# Patient Record
Sex: Female | Born: 1945
Health system: Southern US, Community
[De-identification: ages and names within clinical notes are randomized; demographics above are authoritative.]

## PROBLEM LIST (undated history)

## (undated) DIAGNOSIS — L97509 Non-pressure chronic ulcer of other part of unspecified foot with unspecified severity: Secondary | ICD-10-CM

## (undated) DIAGNOSIS — H338 Other retinal detachments: Secondary | ICD-10-CM

## (undated) DIAGNOSIS — E11621 Type 2 diabetes mellitus with foot ulcer: Secondary | ICD-10-CM

## (undated) DIAGNOSIS — E785 Hyperlipidemia, unspecified: Secondary | ICD-10-CM

## (undated) DIAGNOSIS — R634 Abnormal weight loss: Secondary | ICD-10-CM

## (undated) DIAGNOSIS — R600 Localized edema: Secondary | ICD-10-CM

## (undated) DIAGNOSIS — H269 Unspecified cataract: Secondary | ICD-10-CM

## (undated) DIAGNOSIS — F21 Schizotypal disorder: Secondary | ICD-10-CM

## (undated) DIAGNOSIS — E079 Disorder of thyroid, unspecified: Secondary | ICD-10-CM

## (undated) DIAGNOSIS — I10 Essential (primary) hypertension: Secondary | ICD-10-CM

## (undated) DIAGNOSIS — E13319 Other specified diabetes mellitus with unspecified diabetic retinopathy without macular edema: Secondary | ICD-10-CM

## (undated) HISTORY — DX: Localized edema: R60.0

## (undated) HISTORY — DX: Unspecified cataract: H26.9

## (undated) HISTORY — PX: CATARACT EXTRACTION: SUR2

## (undated) HISTORY — PX: BREAST SURGERY: SHX581

## (undated) HISTORY — DX: Abnormal weight loss: R63.4

## (undated) HISTORY — DX: Other retinal detachments: H33.8

## (undated) HISTORY — DX: Schizotypal disorder: F21

## (undated) HISTORY — DX: Essential (primary) hypertension: I10

## (undated) HISTORY — PX: ROTATOR CUFF REPAIR: SHX139

## (undated) HISTORY — DX: Other specified diabetes mellitus with unspecified diabetic retinopathy without macular edema: E13.319

## (undated) HISTORY — PX: OTHER SURGICAL HISTORY: SHX169

## (undated) HISTORY — DX: Type 2 diabetes mellitus with foot ulcer: E11.621

## (undated) HISTORY — DX: Disorder of thyroid, unspecified: E07.9

## (undated) HISTORY — DX: Hyperlipidemia, unspecified: E78.5

## (undated) HISTORY — PX: TRANSTHORACIC ECHOCARDIOGRAM: SHX275

## (undated) HISTORY — DX: Non-pressure chronic ulcer of other part of unspecified foot with unspecified severity: L97.509

## (undated) HISTORY — PX: BREAST EXCISIONAL BIOPSY: SUR124

## (undated) HISTORY — PX: EYE SURGERY: SHX253

---

## 1989-04-19 HISTORY — PX: ROTATOR CUFF REPAIR: SHX139

## 1997-12-03 ENCOUNTER — Emergency Department (HOSPITAL_COMMUNITY): Admission: EM | Admit: 1997-12-03 | Discharge: 1997-12-03 | Payer: Self-pay | Admitting: Emergency Medicine

## 1998-01-02 ENCOUNTER — Encounter: Admission: RE | Admit: 1998-01-02 | Discharge: 1998-01-02 | Payer: Self-pay | Admitting: Family Medicine

## 1998-01-19 ENCOUNTER — Encounter: Admission: RE | Admit: 1998-01-19 | Discharge: 1998-01-19 | Payer: Self-pay | Admitting: Family Medicine

## 1998-06-13 ENCOUNTER — Encounter: Admission: RE | Admit: 1998-06-13 | Discharge: 1998-06-13 | Payer: Self-pay | Admitting: Sports Medicine

## 1998-06-14 ENCOUNTER — Encounter: Admission: RE | Admit: 1998-06-14 | Discharge: 1998-09-12 | Payer: Self-pay | Admitting: *Deleted

## 1998-06-21 ENCOUNTER — Encounter: Admission: RE | Admit: 1998-06-21 | Discharge: 1998-06-21 | Payer: Self-pay | Admitting: Family Medicine

## 1998-06-29 ENCOUNTER — Encounter: Admission: RE | Admit: 1998-06-29 | Discharge: 1998-06-29 | Payer: Self-pay | Admitting: Family Medicine

## 1998-07-04 ENCOUNTER — Encounter: Admission: RE | Admit: 1998-07-04 | Discharge: 1998-07-04 | Payer: Self-pay | Admitting: Family Medicine

## 1998-08-03 ENCOUNTER — Encounter: Admission: RE | Admit: 1998-08-03 | Discharge: 1998-08-03 | Payer: Self-pay | Admitting: Family Medicine

## 1998-08-22 ENCOUNTER — Encounter: Admission: RE | Admit: 1998-08-22 | Discharge: 1998-08-22 | Payer: Self-pay | Admitting: Sports Medicine

## 1998-10-03 ENCOUNTER — Encounter: Admission: RE | Admit: 1998-10-03 | Discharge: 1998-10-03 | Payer: Self-pay | Admitting: Sports Medicine

## 1999-03-17 ENCOUNTER — Emergency Department (HOSPITAL_COMMUNITY): Admission: EM | Admit: 1999-03-17 | Discharge: 1999-03-17 | Payer: Self-pay | Admitting: Emergency Medicine

## 1999-03-17 ENCOUNTER — Encounter: Payer: Self-pay | Admitting: Emergency Medicine

## 1999-03-20 ENCOUNTER — Encounter: Admission: RE | Admit: 1999-03-20 | Discharge: 1999-03-20 | Payer: Self-pay | Admitting: Family Medicine

## 1999-03-21 ENCOUNTER — Encounter: Admission: RE | Admit: 1999-03-21 | Discharge: 1999-03-21 | Payer: Self-pay | Admitting: Family Medicine

## 1999-04-03 ENCOUNTER — Encounter: Admission: RE | Admit: 1999-04-03 | Discharge: 1999-04-03 | Payer: Self-pay | Admitting: Sports Medicine

## 1999-04-16 ENCOUNTER — Encounter: Admission: RE | Admit: 1999-04-16 | Discharge: 1999-04-16 | Payer: Self-pay | Admitting: Family Medicine

## 1999-04-27 ENCOUNTER — Encounter: Admission: RE | Admit: 1999-04-27 | Discharge: 1999-07-26 | Payer: Self-pay | Admitting: *Deleted

## 1999-05-11 ENCOUNTER — Encounter: Admission: RE | Admit: 1999-05-11 | Discharge: 1999-05-11 | Payer: Self-pay | Admitting: Family Medicine

## 1999-05-14 ENCOUNTER — Encounter: Admission: RE | Admit: 1999-05-14 | Discharge: 1999-05-14 | Payer: Self-pay | Admitting: Family Medicine

## 1999-05-21 ENCOUNTER — Encounter: Admission: RE | Admit: 1999-05-21 | Discharge: 1999-05-21 | Payer: Self-pay | Admitting: Family Medicine

## 1999-06-19 ENCOUNTER — Encounter: Admission: RE | Admit: 1999-06-19 | Discharge: 1999-06-19 | Payer: Self-pay | Admitting: Family Medicine

## 1999-06-28 ENCOUNTER — Encounter: Admission: RE | Admit: 1999-06-28 | Discharge: 1999-06-28 | Payer: Self-pay | Admitting: Family Medicine

## 1999-07-03 ENCOUNTER — Encounter: Admission: RE | Admit: 1999-07-03 | Discharge: 1999-07-03 | Payer: Self-pay | Admitting: Sports Medicine

## 1999-07-03 ENCOUNTER — Encounter: Payer: Self-pay | Admitting: Sports Medicine

## 1999-07-16 ENCOUNTER — Encounter: Admission: RE | Admit: 1999-07-16 | Discharge: 1999-07-16 | Payer: Self-pay | Admitting: Family Medicine

## 1999-07-30 ENCOUNTER — Encounter: Admission: RE | Admit: 1999-07-30 | Discharge: 1999-07-30 | Payer: Self-pay | Admitting: Family Medicine

## 1999-09-19 ENCOUNTER — Encounter: Admission: RE | Admit: 1999-09-19 | Discharge: 1999-09-19 | Payer: Self-pay | Admitting: Family Medicine

## 1999-10-15 ENCOUNTER — Encounter: Admission: RE | Admit: 1999-10-15 | Discharge: 1999-10-15 | Payer: Self-pay | Admitting: Family Medicine

## 1999-10-25 ENCOUNTER — Encounter: Admission: RE | Admit: 1999-10-25 | Discharge: 1999-10-25 | Payer: Self-pay | Admitting: Family Medicine

## 1999-11-01 ENCOUNTER — Encounter: Admission: RE | Admit: 1999-11-01 | Discharge: 1999-11-01 | Payer: Self-pay | Admitting: Family Medicine

## 1999-11-08 ENCOUNTER — Encounter: Admission: RE | Admit: 1999-11-08 | Discharge: 1999-11-08 | Payer: Self-pay | Admitting: Family Medicine

## 1999-11-28 ENCOUNTER — Encounter: Admission: RE | Admit: 1999-11-28 | Discharge: 1999-11-28 | Payer: Self-pay | Admitting: Family Medicine

## 2000-03-04 ENCOUNTER — Encounter: Admission: RE | Admit: 2000-03-04 | Discharge: 2000-06-02 | Payer: Self-pay | Admitting: *Deleted

## 2000-03-23 ENCOUNTER — Emergency Department (HOSPITAL_COMMUNITY): Admission: EM | Admit: 2000-03-23 | Discharge: 2000-03-23 | Payer: Self-pay | Admitting: Emergency Medicine

## 2000-03-24 ENCOUNTER — Encounter: Payer: Self-pay | Admitting: Emergency Medicine

## 2000-03-26 ENCOUNTER — Encounter: Admission: RE | Admit: 2000-03-26 | Discharge: 2000-03-26 | Payer: Self-pay | Admitting: Family Medicine

## 2000-03-28 ENCOUNTER — Encounter: Admission: RE | Admit: 2000-03-28 | Discharge: 2000-03-28 | Payer: Self-pay | Admitting: Family Medicine

## 2000-04-24 ENCOUNTER — Encounter: Admission: RE | Admit: 2000-04-24 | Discharge: 2000-04-24 | Payer: Self-pay | Admitting: Family Medicine

## 2000-04-24 ENCOUNTER — Encounter: Admission: RE | Admit: 2000-04-24 | Discharge: 2000-04-24 | Payer: Self-pay | Admitting: *Deleted

## 2000-04-24 ENCOUNTER — Encounter: Payer: Self-pay | Admitting: *Deleted

## 2000-05-05 ENCOUNTER — Encounter: Admission: RE | Admit: 2000-05-05 | Discharge: 2000-05-05 | Payer: Self-pay | Admitting: *Deleted

## 2000-05-05 ENCOUNTER — Encounter: Payer: Self-pay | Admitting: *Deleted

## 2000-05-20 ENCOUNTER — Encounter: Admission: RE | Admit: 2000-05-20 | Discharge: 2000-05-20 | Payer: Self-pay | Admitting: Family Medicine

## 2000-06-04 ENCOUNTER — Encounter: Admission: RE | Admit: 2000-06-04 | Discharge: 2000-06-04 | Payer: Self-pay | Admitting: Family Medicine

## 2000-06-11 ENCOUNTER — Encounter: Admission: RE | Admit: 2000-06-11 | Discharge: 2000-06-11 | Payer: Self-pay | Admitting: Family Medicine

## 2000-06-16 ENCOUNTER — Encounter: Admission: RE | Admit: 2000-06-16 | Discharge: 2000-06-16 | Payer: Self-pay | Admitting: Family Medicine

## 2000-07-02 ENCOUNTER — Encounter: Admission: RE | Admit: 2000-07-02 | Discharge: 2000-07-02 | Payer: Self-pay | Admitting: Family Medicine

## 2000-07-03 ENCOUNTER — Encounter: Admission: RE | Admit: 2000-07-03 | Discharge: 2000-07-03 | Payer: Self-pay | Admitting: *Deleted

## 2000-07-16 ENCOUNTER — Encounter: Admission: RE | Admit: 2000-07-16 | Discharge: 2000-07-16 | Payer: Self-pay | Admitting: Family Medicine

## 2000-07-25 ENCOUNTER — Encounter: Admission: RE | Admit: 2000-07-25 | Discharge: 2000-07-25 | Payer: Self-pay | Admitting: Family Medicine

## 2000-07-30 ENCOUNTER — Encounter: Admission: RE | Admit: 2000-07-30 | Discharge: 2000-07-30 | Payer: Self-pay | Admitting: Family Medicine

## 2000-08-20 ENCOUNTER — Encounter: Admission: RE | Admit: 2000-08-20 | Discharge: 2000-08-20 | Payer: Self-pay | Admitting: Pediatrics

## 2000-09-03 ENCOUNTER — Encounter: Admission: RE | Admit: 2000-09-03 | Discharge: 2000-09-03 | Payer: Self-pay | Admitting: Family Medicine

## 2000-09-17 ENCOUNTER — Encounter: Admission: RE | Admit: 2000-09-17 | Discharge: 2000-09-17 | Payer: Self-pay | Admitting: Family Medicine

## 2000-10-02 ENCOUNTER — Encounter: Admission: RE | Admit: 2000-10-02 | Discharge: 2000-10-02 | Payer: Self-pay | Admitting: Family Medicine

## 2001-01-01 ENCOUNTER — Encounter: Admission: RE | Admit: 2001-01-01 | Discharge: 2001-01-01 | Payer: Self-pay | Admitting: Family Medicine

## 2001-02-06 ENCOUNTER — Encounter: Admission: RE | Admit: 2001-02-06 | Discharge: 2001-05-07 | Payer: Self-pay | Admitting: *Deleted

## 2001-02-09 ENCOUNTER — Encounter: Admission: RE | Admit: 2001-02-09 | Discharge: 2001-02-09 | Payer: Self-pay | Admitting: Family Medicine

## 2001-03-19 ENCOUNTER — Encounter: Admission: RE | Admit: 2001-03-19 | Discharge: 2001-03-19 | Payer: Self-pay | Admitting: Family Medicine

## 2001-04-09 ENCOUNTER — Encounter: Admission: RE | Admit: 2001-04-09 | Discharge: 2001-04-09 | Payer: Self-pay | Admitting: Family Medicine

## 2001-05-08 ENCOUNTER — Encounter: Admission: RE | Admit: 2001-05-08 | Discharge: 2001-05-08 | Payer: Self-pay | Admitting: Family Medicine

## 2001-10-01 ENCOUNTER — Encounter: Admission: RE | Admit: 2001-10-01 | Discharge: 2001-10-01 | Payer: Self-pay | Admitting: Family Medicine

## 2001-10-08 ENCOUNTER — Encounter: Admission: RE | Admit: 2001-10-08 | Discharge: 2001-10-08 | Payer: Self-pay | Admitting: Family Medicine

## 2001-10-19 ENCOUNTER — Encounter: Admission: RE | Admit: 2001-10-19 | Discharge: 2001-10-19 | Payer: Self-pay | Admitting: *Deleted

## 2001-10-19 ENCOUNTER — Encounter: Payer: Self-pay | Admitting: *Deleted

## 2001-10-20 ENCOUNTER — Encounter: Admission: RE | Admit: 2001-10-20 | Discharge: 2001-10-20 | Payer: Self-pay | Admitting: Family Medicine

## 2001-11-02 ENCOUNTER — Encounter: Admission: RE | Admit: 2001-11-02 | Discharge: 2001-11-02 | Payer: Self-pay | Admitting: Family Medicine

## 2001-12-02 ENCOUNTER — Encounter: Admission: RE | Admit: 2001-12-02 | Discharge: 2001-12-02 | Payer: Self-pay | Admitting: Family Medicine

## 2001-12-14 ENCOUNTER — Encounter: Admission: RE | Admit: 2001-12-14 | Discharge: 2001-12-14 | Payer: Self-pay | Admitting: Sports Medicine

## 2001-12-31 ENCOUNTER — Encounter: Admission: RE | Admit: 2001-12-31 | Discharge: 2001-12-31 | Payer: Self-pay | Admitting: Family Medicine

## 2002-01-05 ENCOUNTER — Encounter: Admission: RE | Admit: 2002-01-05 | Discharge: 2002-01-05 | Payer: Self-pay | Admitting: Family Medicine

## 2002-01-08 ENCOUNTER — Encounter: Admission: RE | Admit: 2002-01-08 | Discharge: 2002-01-08 | Payer: Self-pay | Admitting: Family Medicine

## 2002-02-24 ENCOUNTER — Encounter: Admission: RE | Admit: 2002-02-24 | Discharge: 2002-02-24 | Payer: Self-pay | Admitting: Sports Medicine

## 2002-04-15 ENCOUNTER — Encounter: Admission: RE | Admit: 2002-04-15 | Discharge: 2002-04-15 | Payer: Self-pay | Admitting: Family Medicine

## 2002-05-13 ENCOUNTER — Encounter: Admission: RE | Admit: 2002-05-13 | Discharge: 2002-05-13 | Payer: Self-pay | Admitting: Family Medicine

## 2002-05-28 ENCOUNTER — Encounter: Admission: RE | Admit: 2002-05-28 | Discharge: 2002-05-28 | Payer: Self-pay | Admitting: Family Medicine

## 2002-06-03 ENCOUNTER — Encounter: Payer: Self-pay | Admitting: Emergency Medicine

## 2002-06-03 ENCOUNTER — Emergency Department (HOSPITAL_COMMUNITY): Admission: EM | Admit: 2002-06-03 | Discharge: 2002-06-03 | Payer: Self-pay | Admitting: Emergency Medicine

## 2002-07-14 ENCOUNTER — Encounter: Admission: RE | Admit: 2002-07-14 | Discharge: 2002-07-14 | Payer: Self-pay | Admitting: Family Medicine

## 2002-09-09 ENCOUNTER — Encounter: Admission: RE | Admit: 2002-09-09 | Discharge: 2002-09-09 | Payer: Self-pay | Admitting: Family Medicine

## 2002-09-23 ENCOUNTER — Encounter: Admission: RE | Admit: 2002-09-23 | Discharge: 2002-09-23 | Payer: Self-pay | Admitting: Family Medicine

## 2002-09-27 ENCOUNTER — Encounter: Admission: RE | Admit: 2002-09-27 | Discharge: 2002-09-27 | Payer: Self-pay | Admitting: Family Medicine

## 2002-10-12 ENCOUNTER — Encounter: Admission: RE | Admit: 2002-10-12 | Discharge: 2002-10-12 | Payer: Self-pay | Admitting: Family Medicine

## 2002-10-27 ENCOUNTER — Encounter: Admission: RE | Admit: 2002-10-27 | Discharge: 2002-10-27 | Payer: Self-pay | Admitting: Family Medicine

## 2002-11-29 ENCOUNTER — Encounter: Admission: RE | Admit: 2002-11-29 | Discharge: 2002-11-29 | Payer: Self-pay | Admitting: Family Medicine

## 2003-02-24 ENCOUNTER — Encounter: Admission: RE | Admit: 2003-02-24 | Discharge: 2003-02-24 | Payer: Self-pay | Admitting: Family Medicine

## 2003-03-31 ENCOUNTER — Encounter: Admission: RE | Admit: 2003-03-31 | Discharge: 2003-03-31 | Payer: Self-pay | Admitting: Family Medicine

## 2003-05-30 ENCOUNTER — Encounter: Admission: RE | Admit: 2003-05-30 | Discharge: 2003-05-30 | Payer: Self-pay | Admitting: Family Medicine

## 2003-06-17 ENCOUNTER — Encounter: Admission: RE | Admit: 2003-06-17 | Discharge: 2003-06-17 | Payer: Self-pay | Admitting: Family Medicine

## 2003-07-26 ENCOUNTER — Encounter: Admission: RE | Admit: 2003-07-26 | Discharge: 2003-07-26 | Payer: Self-pay | Admitting: Sports Medicine

## 2003-08-02 ENCOUNTER — Encounter: Admission: RE | Admit: 2003-08-02 | Discharge: 2003-08-02 | Payer: Self-pay | Admitting: Sports Medicine

## 2003-08-03 ENCOUNTER — Ambulatory Visit (HOSPITAL_COMMUNITY): Admission: RE | Admit: 2003-08-03 | Discharge: 2003-08-03 | Payer: Self-pay | Admitting: Sports Medicine

## 2003-08-11 ENCOUNTER — Encounter: Admission: RE | Admit: 2003-08-11 | Discharge: 2003-08-11 | Payer: Self-pay | Admitting: Family Medicine

## 2003-08-25 ENCOUNTER — Encounter: Admission: RE | Admit: 2003-08-25 | Discharge: 2003-08-25 | Payer: Self-pay | Admitting: Family Medicine

## 2003-09-14 ENCOUNTER — Encounter: Admission: RE | Admit: 2003-09-14 | Discharge: 2003-09-14 | Payer: Self-pay | Admitting: Family Medicine

## 2003-10-04 ENCOUNTER — Encounter: Admission: RE | Admit: 2003-10-04 | Discharge: 2003-10-04 | Payer: Self-pay | Admitting: Sports Medicine

## 2003-10-18 ENCOUNTER — Encounter: Admission: RE | Admit: 2003-10-18 | Discharge: 2003-10-18 | Payer: Self-pay | Admitting: Family Medicine

## 2003-10-25 ENCOUNTER — Encounter: Admission: RE | Admit: 2003-10-25 | Discharge: 2004-01-23 | Payer: Self-pay | Admitting: *Deleted

## 2003-10-27 ENCOUNTER — Encounter: Admission: RE | Admit: 2003-10-27 | Discharge: 2003-10-27 | Payer: Self-pay | Admitting: Sports Medicine

## 2003-11-29 ENCOUNTER — Encounter: Admission: RE | Admit: 2003-11-29 | Discharge: 2003-11-29 | Payer: Self-pay | Admitting: Sports Medicine

## 2003-12-21 ENCOUNTER — Encounter: Admission: RE | Admit: 2003-12-21 | Discharge: 2003-12-21 | Payer: Self-pay | Admitting: Family Medicine

## 2004-03-21 ENCOUNTER — Encounter: Admission: RE | Admit: 2004-03-21 | Discharge: 2004-03-21 | Payer: Self-pay | Admitting: Sports Medicine

## 2004-04-12 ENCOUNTER — Encounter: Admission: RE | Admit: 2004-04-12 | Discharge: 2004-04-12 | Payer: Self-pay | Admitting: Family Medicine

## 2004-04-23 ENCOUNTER — Emergency Department (HOSPITAL_COMMUNITY): Admission: EM | Admit: 2004-04-23 | Discharge: 2004-04-23 | Payer: Self-pay | Admitting: Family Medicine

## 2004-05-07 ENCOUNTER — Ambulatory Visit: Payer: Self-pay | Admitting: Family Medicine

## 2004-05-22 ENCOUNTER — Encounter: Admission: RE | Admit: 2004-05-22 | Discharge: 2004-06-14 | Payer: Self-pay

## 2004-06-08 ENCOUNTER — Ambulatory Visit: Payer: Self-pay | Admitting: Sports Medicine

## 2004-06-22 ENCOUNTER — Ambulatory Visit: Payer: Self-pay | Admitting: Sports Medicine

## 2004-07-19 ENCOUNTER — Ambulatory Visit: Payer: Self-pay | Admitting: Family Medicine

## 2004-09-10 ENCOUNTER — Ambulatory Visit: Payer: Self-pay | Admitting: Family Medicine

## 2004-10-15 ENCOUNTER — Ambulatory Visit: Payer: Self-pay | Admitting: Family Medicine

## 2004-10-18 ENCOUNTER — Ambulatory Visit (HOSPITAL_COMMUNITY): Admission: RE | Admit: 2004-10-18 | Discharge: 2004-10-18 | Payer: Self-pay | Admitting: Family Medicine

## 2004-10-18 ENCOUNTER — Ambulatory Visit: Payer: Self-pay | Admitting: Family Medicine

## 2004-11-07 ENCOUNTER — Ambulatory Visit: Payer: Self-pay | Admitting: Family Medicine

## 2004-11-08 ENCOUNTER — Ambulatory Visit: Payer: Self-pay | Admitting: Sports Medicine

## 2004-11-14 ENCOUNTER — Encounter: Payer: Self-pay | Admitting: Cardiology

## 2004-11-14 ENCOUNTER — Ambulatory Visit: Payer: Self-pay | Admitting: Cardiology

## 2004-11-14 ENCOUNTER — Ambulatory Visit (HOSPITAL_COMMUNITY): Admission: RE | Admit: 2004-11-14 | Discharge: 2004-11-14 | Payer: Self-pay | Admitting: Sports Medicine

## 2004-11-15 ENCOUNTER — Ambulatory Visit: Payer: Self-pay | Admitting: Family Medicine

## 2004-11-29 ENCOUNTER — Ambulatory Visit: Payer: Self-pay | Admitting: Sports Medicine

## 2004-12-11 ENCOUNTER — Ambulatory Visit: Payer: Self-pay | Admitting: Family Medicine

## 2004-12-14 ENCOUNTER — Ambulatory Visit: Payer: Self-pay | Admitting: Family Medicine

## 2004-12-21 ENCOUNTER — Ambulatory Visit: Payer: Self-pay | Admitting: Family Medicine

## 2004-12-31 ENCOUNTER — Ambulatory Visit: Payer: Self-pay | Admitting: Family Medicine

## 2005-01-08 ENCOUNTER — Ambulatory Visit: Payer: Self-pay | Admitting: Family Medicine

## 2005-01-15 ENCOUNTER — Ambulatory Visit: Payer: Self-pay | Admitting: Family Medicine

## 2005-01-28 ENCOUNTER — Ambulatory Visit: Payer: Self-pay | Admitting: Family Medicine

## 2005-01-30 ENCOUNTER — Emergency Department (HOSPITAL_COMMUNITY): Admission: EM | Admit: 2005-01-30 | Discharge: 2005-01-30 | Payer: Self-pay | Admitting: Family Medicine

## 2005-02-05 ENCOUNTER — Ambulatory Visit: Payer: Self-pay | Admitting: Family Medicine

## 2005-02-08 ENCOUNTER — Ambulatory Visit: Payer: Self-pay | Admitting: Sports Medicine

## 2005-02-26 ENCOUNTER — Ambulatory Visit: Payer: Self-pay | Admitting: Family Medicine

## 2005-03-07 ENCOUNTER — Ambulatory Visit: Payer: Self-pay | Admitting: Family Medicine

## 2005-03-22 ENCOUNTER — Ambulatory Visit: Payer: Self-pay | Admitting: Family Medicine

## 2005-04-08 ENCOUNTER — Ambulatory Visit: Payer: Self-pay | Admitting: Family Medicine

## 2005-04-23 ENCOUNTER — Emergency Department (HOSPITAL_COMMUNITY): Admission: EM | Admit: 2005-04-23 | Discharge: 2005-04-23 | Payer: Self-pay | Admitting: Family Medicine

## 2005-04-23 ENCOUNTER — Ambulatory Visit: Payer: Self-pay | Admitting: Sports Medicine

## 2005-05-29 ENCOUNTER — Ambulatory Visit: Payer: Self-pay | Admitting: Family Medicine

## 2005-06-11 ENCOUNTER — Ambulatory Visit: Payer: Self-pay | Admitting: Sports Medicine

## 2005-06-14 ENCOUNTER — Ambulatory Visit (HOSPITAL_COMMUNITY): Admission: RE | Admit: 2005-06-14 | Discharge: 2005-06-14 | Payer: Self-pay | Admitting: Sports Medicine

## 2005-07-02 ENCOUNTER — Ambulatory Visit: Payer: Self-pay | Admitting: Sports Medicine

## 2005-07-24 ENCOUNTER — Encounter (INDEPENDENT_AMBULATORY_CARE_PROVIDER_SITE_OTHER): Payer: Self-pay | Admitting: *Deleted

## 2005-07-24 LAB — CONVERTED CEMR LAB

## 2005-07-25 ENCOUNTER — Ambulatory Visit: Payer: Self-pay | Admitting: Family Medicine

## 2005-08-30 ENCOUNTER — Ambulatory Visit: Payer: Self-pay | Admitting: Sports Medicine

## 2005-09-12 ENCOUNTER — Ambulatory Visit: Payer: Self-pay | Admitting: Family Medicine

## 2005-09-23 ENCOUNTER — Ambulatory Visit (HOSPITAL_COMMUNITY): Admission: RE | Admit: 2005-09-23 | Discharge: 2005-09-23 | Payer: Self-pay

## 2005-09-23 ENCOUNTER — Ambulatory Visit: Payer: Self-pay | Admitting: Family Medicine

## 2005-09-27 ENCOUNTER — Ambulatory Visit: Payer: Self-pay | Admitting: Family Medicine

## 2005-12-13 ENCOUNTER — Ambulatory Visit: Payer: Self-pay | Admitting: Family Medicine

## 2005-12-27 ENCOUNTER — Ambulatory Visit: Payer: Self-pay | Admitting: Family Medicine

## 2006-01-03 ENCOUNTER — Ambulatory Visit: Payer: Self-pay | Admitting: Family Medicine

## 2006-02-04 ENCOUNTER — Ambulatory Visit (HOSPITAL_COMMUNITY): Admission: RE | Admit: 2006-02-04 | Discharge: 2006-02-04 | Payer: Self-pay | Admitting: Sports Medicine

## 2006-02-04 ENCOUNTER — Encounter (INDEPENDENT_AMBULATORY_CARE_PROVIDER_SITE_OTHER): Payer: Self-pay | Admitting: Cardiology

## 2006-03-04 ENCOUNTER — Ambulatory Visit: Payer: Self-pay | Admitting: Family Medicine

## 2006-03-28 ENCOUNTER — Ambulatory Visit: Payer: Self-pay | Admitting: Family Medicine

## 2006-05-07 ENCOUNTER — Ambulatory Visit: Payer: Self-pay | Admitting: Family Medicine

## 2006-06-04 ENCOUNTER — Ambulatory Visit: Payer: Self-pay | Admitting: Family Medicine

## 2006-07-03 ENCOUNTER — Ambulatory Visit: Payer: Self-pay | Admitting: Family Medicine

## 2006-08-28 ENCOUNTER — Ambulatory Visit: Payer: Self-pay | Admitting: Family Medicine

## 2006-09-25 ENCOUNTER — Ambulatory Visit: Payer: Self-pay | Admitting: Family Medicine

## 2006-10-16 DIAGNOSIS — N3941 Urge incontinence: Secondary | ICD-10-CM | POA: Insufficient documentation

## 2006-10-16 DIAGNOSIS — M899 Disorder of bone, unspecified: Secondary | ICD-10-CM | POA: Insufficient documentation

## 2006-10-16 DIAGNOSIS — E119 Type 2 diabetes mellitus without complications: Secondary | ICD-10-CM | POA: Insufficient documentation

## 2006-10-16 DIAGNOSIS — E039 Hypothyroidism, unspecified: Secondary | ICD-10-CM | POA: Insufficient documentation

## 2006-10-16 DIAGNOSIS — M949 Disorder of cartilage, unspecified: Secondary | ICD-10-CM

## 2006-10-16 DIAGNOSIS — M069 Rheumatoid arthritis, unspecified: Secondary | ICD-10-CM | POA: Insufficient documentation

## 2006-10-16 DIAGNOSIS — E669 Obesity, unspecified: Secondary | ICD-10-CM | POA: Insufficient documentation

## 2006-10-16 DIAGNOSIS — K219 Gastro-esophageal reflux disease without esophagitis: Secondary | ICD-10-CM | POA: Insufficient documentation

## 2006-10-16 DIAGNOSIS — I10 Essential (primary) hypertension: Secondary | ICD-10-CM | POA: Insufficient documentation

## 2006-10-16 DIAGNOSIS — E1165 Type 2 diabetes mellitus with hyperglycemia: Secondary | ICD-10-CM

## 2006-10-17 ENCOUNTER — Encounter (INDEPENDENT_AMBULATORY_CARE_PROVIDER_SITE_OTHER): Payer: Self-pay | Admitting: *Deleted

## 2006-12-01 ENCOUNTER — Ambulatory Visit: Payer: Self-pay | Admitting: Family Medicine

## 2006-12-01 ENCOUNTER — Encounter (INDEPENDENT_AMBULATORY_CARE_PROVIDER_SITE_OTHER): Payer: Self-pay | Admitting: Family Medicine

## 2006-12-01 LAB — CONVERTED CEMR LAB
Alkaline Phosphatase: 93 units/L (ref 39–117)
BUN: 11 mg/dL (ref 6–23)
Basophils Absolute: 0 10*3/uL (ref 0.0–0.1)
Basophils Relative: 0 % (ref 0–1)
Cholesterol, target level: 200 mg/dL
Creatinine, Ser: 0.73 mg/dL (ref 0.40–1.20)
Eosinophils Relative: 2 % (ref 0–5)
Folate: >20 ng/mL
Glucose, Bld: 97 mg/dL (ref 70–99)
Glucose, Urine, Semiquant: 100
Hemoglobin: 11.5 g/dL — ABNORMAL LOW (ref 12.0–15.0)
LDL Goal: 100 mg/dL
MCHC: 30.8 g/dL (ref 30.0–36.0)
Monocytes Absolute: 0.5 10*3/uL (ref 0.2–0.7)
Neutro Abs: 2.5 10*3/uL (ref 1.7–7.7)
RDW: 13.8 % (ref 11.5–14.0)
Sodium: 144 meq/L (ref 135–145)
Total Bilirubin: 0.4 mg/dL (ref 0.3–1.2)
Vitamin B-12: 636 pg/mL (ref 211–911)

## 2006-12-04 ENCOUNTER — Encounter (INDEPENDENT_AMBULATORY_CARE_PROVIDER_SITE_OTHER): Payer: Self-pay | Admitting: Family Medicine

## 2007-01-02 ENCOUNTER — Ambulatory Visit: Payer: Self-pay | Admitting: Family Medicine

## 2007-01-02 ENCOUNTER — Telehealth: Payer: Self-pay | Admitting: *Deleted

## 2007-02-16 ENCOUNTER — Telehealth (INDEPENDENT_AMBULATORY_CARE_PROVIDER_SITE_OTHER): Payer: Self-pay | Admitting: *Deleted

## 2007-02-18 ENCOUNTER — Encounter (INDEPENDENT_AMBULATORY_CARE_PROVIDER_SITE_OTHER): Payer: Self-pay | Admitting: Family Medicine

## 2007-02-18 ENCOUNTER — Ambulatory Visit: Payer: Self-pay | Admitting: Sports Medicine

## 2007-03-04 ENCOUNTER — Telehealth: Payer: Self-pay | Admitting: *Deleted

## 2007-03-17 ENCOUNTER — Ambulatory Visit: Payer: Self-pay | Admitting: Family Medicine

## 2007-03-17 DIAGNOSIS — E1140 Type 2 diabetes mellitus with diabetic neuropathy, unspecified: Secondary | ICD-10-CM | POA: Insufficient documentation

## 2007-03-17 DIAGNOSIS — E1149 Type 2 diabetes mellitus with other diabetic neurological complication: Secondary | ICD-10-CM | POA: Insufficient documentation

## 2007-03-17 DIAGNOSIS — E1139 Type 2 diabetes mellitus with other diabetic ophthalmic complication: Secondary | ICD-10-CM | POA: Insufficient documentation

## 2007-03-17 LAB — CONVERTED CEMR LAB: Hgb A1c MFr Bld: 8 %

## 2007-03-18 ENCOUNTER — Telehealth (INDEPENDENT_AMBULATORY_CARE_PROVIDER_SITE_OTHER): Payer: Self-pay | Admitting: Family Medicine

## 2007-04-01 ENCOUNTER — Encounter: Admission: RE | Admit: 2007-04-01 | Discharge: 2007-04-01 | Payer: Self-pay | Admitting: Family Medicine

## 2007-04-02 ENCOUNTER — Ambulatory Visit: Payer: Self-pay | Admitting: Family Medicine

## 2007-04-06 ENCOUNTER — Ambulatory Visit (HOSPITAL_COMMUNITY): Admission: RE | Admit: 2007-04-06 | Discharge: 2007-04-06 | Payer: Self-pay | Admitting: Family Medicine

## 2007-04-21 ENCOUNTER — Encounter (INDEPENDENT_AMBULATORY_CARE_PROVIDER_SITE_OTHER): Payer: Self-pay | Admitting: Family Medicine

## 2007-04-29 ENCOUNTER — Telehealth: Payer: Self-pay | Admitting: *Deleted

## 2007-04-30 ENCOUNTER — Ambulatory Visit: Payer: Self-pay | Admitting: Family Medicine

## 2007-05-13 ENCOUNTER — Telehealth (INDEPENDENT_AMBULATORY_CARE_PROVIDER_SITE_OTHER): Payer: Self-pay | Admitting: Family Medicine

## 2007-05-21 ENCOUNTER — Encounter (INDEPENDENT_AMBULATORY_CARE_PROVIDER_SITE_OTHER): Payer: Self-pay | Admitting: Family Medicine

## 2007-06-09 ENCOUNTER — Telehealth (INDEPENDENT_AMBULATORY_CARE_PROVIDER_SITE_OTHER): Payer: Self-pay | Admitting: Family Medicine

## 2007-06-10 ENCOUNTER — Ambulatory Visit: Payer: Self-pay | Admitting: Family Medicine

## 2007-06-22 ENCOUNTER — Telehealth: Payer: Self-pay | Admitting: *Deleted

## 2007-06-23 ENCOUNTER — Encounter (INDEPENDENT_AMBULATORY_CARE_PROVIDER_SITE_OTHER): Payer: Self-pay | Admitting: Family Medicine

## 2007-06-27 ENCOUNTER — Encounter (INDEPENDENT_AMBULATORY_CARE_PROVIDER_SITE_OTHER): Payer: Self-pay | Admitting: Family Medicine

## 2007-07-10 ENCOUNTER — Ambulatory Visit: Payer: Self-pay | Admitting: Family Medicine

## 2007-07-27 ENCOUNTER — Encounter (INDEPENDENT_AMBULATORY_CARE_PROVIDER_SITE_OTHER): Payer: Self-pay | Admitting: Family Medicine

## 2007-07-27 ENCOUNTER — Ambulatory Visit: Payer: Self-pay | Admitting: Sports Medicine

## 2007-07-27 LAB — CONVERTED CEMR LAB: Hgb A1c MFr Bld: 10.8 %

## 2007-07-28 LAB — CONVERTED CEMR LAB: TSH: 6.306 microintl units/mL — ABNORMAL HIGH (ref 0.350–5.50)

## 2007-08-13 ENCOUNTER — Telehealth (INDEPENDENT_AMBULATORY_CARE_PROVIDER_SITE_OTHER): Payer: Self-pay | Admitting: Family Medicine

## 2007-09-21 ENCOUNTER — Encounter (INDEPENDENT_AMBULATORY_CARE_PROVIDER_SITE_OTHER): Payer: Self-pay | Admitting: Family Medicine

## 2007-10-06 ENCOUNTER — Ambulatory Visit: Payer: Self-pay | Admitting: Family Medicine

## 2007-10-08 ENCOUNTER — Ambulatory Visit: Payer: Self-pay | Admitting: Family Medicine

## 2007-10-08 ENCOUNTER — Encounter (INDEPENDENT_AMBULATORY_CARE_PROVIDER_SITE_OTHER): Payer: Self-pay | Admitting: Family Medicine

## 2007-11-17 ENCOUNTER — Ambulatory Visit: Payer: Self-pay | Admitting: Family Medicine

## 2008-01-12 ENCOUNTER — Telehealth (INDEPENDENT_AMBULATORY_CARE_PROVIDER_SITE_OTHER): Payer: Self-pay | Admitting: Family Medicine

## 2008-02-02 ENCOUNTER — Ambulatory Visit: Payer: Self-pay | Admitting: Family Medicine

## 2008-02-17 ENCOUNTER — Encounter (INDEPENDENT_AMBULATORY_CARE_PROVIDER_SITE_OTHER): Payer: Self-pay | Admitting: Family Medicine

## 2008-02-17 ENCOUNTER — Ambulatory Visit: Payer: Self-pay | Admitting: Family Medicine

## 2008-02-17 LAB — CONVERTED CEMR LAB: LDL Cholesterol: 89 mg/dL

## 2008-03-22 ENCOUNTER — Ambulatory Visit: Payer: Self-pay | Admitting: Family Medicine

## 2008-03-22 ENCOUNTER — Encounter (INDEPENDENT_AMBULATORY_CARE_PROVIDER_SITE_OTHER): Payer: Self-pay | Admitting: Family Medicine

## 2008-03-22 ENCOUNTER — Telehealth: Payer: Self-pay | Admitting: *Deleted

## 2008-05-04 ENCOUNTER — Ambulatory Visit: Payer: Self-pay | Admitting: Family Medicine

## 2008-05-04 ENCOUNTER — Encounter (INDEPENDENT_AMBULATORY_CARE_PROVIDER_SITE_OTHER): Payer: Self-pay | Admitting: *Deleted

## 2008-05-06 ENCOUNTER — Encounter (INDEPENDENT_AMBULATORY_CARE_PROVIDER_SITE_OTHER): Payer: Self-pay | Admitting: Family Medicine

## 2008-05-11 ENCOUNTER — Ambulatory Visit: Payer: Self-pay | Admitting: Family Medicine

## 2008-05-11 ENCOUNTER — Encounter (INDEPENDENT_AMBULATORY_CARE_PROVIDER_SITE_OTHER): Payer: Self-pay | Admitting: Family Medicine

## 2008-05-11 LAB — CONVERTED CEMR LAB
ALT: 33 units/L (ref 0–35)
Albumin: 3.5 g/dL (ref 3.5–5.2)
BUN: 9 mg/dL (ref 6–23)
CO2: 25 meq/L (ref 19–32)
Calcium: 9.1 mg/dL (ref 8.4–10.5)
Chloride: 104 meq/L (ref 96–112)
Creatinine, Ser: 0.84 mg/dL (ref 0.40–1.20)
HCT: 35.9 % — ABNORMAL LOW (ref 36.0–46.0)
Hemoglobin: 11.3 g/dL — ABNORMAL LOW (ref 12.0–15.0)
Potassium: 4.2 meq/L (ref 3.5–5.3)
TSH: 8.317 microintl units/mL — ABNORMAL HIGH (ref 0.350–4.50)
WBC: 4.7 10*3/uL (ref 4.0–10.5)

## 2008-05-12 ENCOUNTER — Ambulatory Visit: Payer: Self-pay | Admitting: Family Medicine

## 2008-05-12 ENCOUNTER — Encounter (INDEPENDENT_AMBULATORY_CARE_PROVIDER_SITE_OTHER): Payer: Self-pay | Admitting: Family Medicine

## 2008-05-13 ENCOUNTER — Encounter: Payer: Self-pay | Admitting: *Deleted

## 2008-05-23 ENCOUNTER — Ambulatory Visit: Payer: Self-pay | Admitting: Family Medicine

## 2008-06-09 ENCOUNTER — Ambulatory Visit: Payer: Self-pay | Admitting: Family Medicine

## 2008-06-14 ENCOUNTER — Ambulatory Visit: Payer: Self-pay | Admitting: Family Medicine

## 2008-06-23 ENCOUNTER — Ambulatory Visit: Payer: Self-pay | Admitting: Family Medicine

## 2008-07-07 ENCOUNTER — Ambulatory Visit: Payer: Self-pay | Admitting: Family Medicine

## 2008-08-16 ENCOUNTER — Ambulatory Visit: Payer: Self-pay | Admitting: Family Medicine

## 2008-08-16 ENCOUNTER — Encounter (INDEPENDENT_AMBULATORY_CARE_PROVIDER_SITE_OTHER): Payer: Self-pay | Admitting: Family Medicine

## 2008-08-16 LAB — CONVERTED CEMR LAB: Hgb A1c MFr Bld: 10.7 %

## 2008-09-27 ENCOUNTER — Ambulatory Visit (HOSPITAL_COMMUNITY): Admission: RE | Admit: 2008-09-27 | Discharge: 2008-09-27 | Payer: Self-pay | Admitting: Family Medicine

## 2008-10-03 ENCOUNTER — Telehealth (INDEPENDENT_AMBULATORY_CARE_PROVIDER_SITE_OTHER): Payer: Self-pay | Admitting: Family Medicine

## 2008-10-04 ENCOUNTER — Telehealth (INDEPENDENT_AMBULATORY_CARE_PROVIDER_SITE_OTHER): Payer: Self-pay | Admitting: *Deleted

## 2008-10-04 ENCOUNTER — Ambulatory Visit: Payer: Self-pay | Admitting: Family Medicine

## 2008-10-31 ENCOUNTER — Telehealth: Payer: Self-pay | Admitting: *Deleted

## 2008-11-04 ENCOUNTER — Ambulatory Visit: Payer: Self-pay | Admitting: Family Medicine

## 2008-11-10 ENCOUNTER — Ambulatory Visit: Payer: Self-pay | Admitting: Family Medicine

## 2008-11-24 ENCOUNTER — Ambulatory Visit: Payer: Self-pay | Admitting: Family Medicine

## 2008-12-19 ENCOUNTER — Ambulatory Visit: Payer: Self-pay | Admitting: Family Medicine

## 2009-01-30 ENCOUNTER — Ambulatory Visit: Payer: Self-pay | Admitting: Family Medicine

## 2009-02-01 ENCOUNTER — Emergency Department (HOSPITAL_COMMUNITY): Admission: EM | Admit: 2009-02-01 | Discharge: 2009-02-01 | Payer: Self-pay | Admitting: Emergency Medicine

## 2009-02-06 ENCOUNTER — Emergency Department (HOSPITAL_COMMUNITY): Admission: EM | Admit: 2009-02-06 | Discharge: 2009-02-06 | Payer: Self-pay | Admitting: Emergency Medicine

## 2009-02-13 ENCOUNTER — Telehealth: Payer: Self-pay | Admitting: Family Medicine

## 2009-02-14 ENCOUNTER — Encounter (INDEPENDENT_AMBULATORY_CARE_PROVIDER_SITE_OTHER): Payer: Self-pay | Admitting: Family Medicine

## 2009-02-14 ENCOUNTER — Ambulatory Visit: Payer: Self-pay | Admitting: Family Medicine

## 2009-03-23 ENCOUNTER — Ambulatory Visit: Payer: Self-pay | Admitting: Family Medicine

## 2009-04-20 ENCOUNTER — Encounter: Payer: Self-pay | Admitting: Family Medicine

## 2009-04-20 ENCOUNTER — Ambulatory Visit: Payer: Self-pay | Admitting: Family Medicine

## 2009-04-21 ENCOUNTER — Encounter: Payer: Self-pay | Admitting: Family Medicine

## 2009-04-21 LAB — CONVERTED CEMR LAB: TSH: 8.619 microintl units/mL — ABNORMAL HIGH (ref 0.350–4.500)

## 2009-05-01 ENCOUNTER — Ambulatory Visit: Payer: Self-pay

## 2009-08-28 ENCOUNTER — Telehealth: Payer: Self-pay | Admitting: *Deleted

## 2009-09-01 ENCOUNTER — Encounter: Payer: Self-pay | Admitting: Family Medicine

## 2009-09-01 ENCOUNTER — Ambulatory Visit: Payer: Self-pay | Admitting: Family Medicine

## 2009-09-01 LAB — CONVERTED CEMR LAB: Hgb A1c MFr Bld: 13.7 %

## 2009-09-29 ENCOUNTER — Ambulatory Visit: Payer: Self-pay | Admitting: Family Medicine

## 2009-10-13 ENCOUNTER — Ambulatory Visit: Payer: Self-pay | Admitting: Family Medicine

## 2009-11-17 ENCOUNTER — Ambulatory Visit: Payer: Self-pay | Admitting: Family Medicine

## 2010-01-23 ENCOUNTER — Ambulatory Visit: Payer: Self-pay | Admitting: Family Medicine

## 2010-01-23 ENCOUNTER — Encounter: Payer: Self-pay | Admitting: Family Medicine

## 2010-01-23 LAB — CONVERTED CEMR LAB: Hgb A1c MFr Bld: 10.5 %

## 2010-01-26 ENCOUNTER — Encounter: Payer: Self-pay | Admitting: Family Medicine

## 2010-03-12 ENCOUNTER — Ambulatory Visit: Payer: Self-pay | Admitting: Family Medicine

## 2010-05-25 ENCOUNTER — Encounter: Payer: Self-pay | Admitting: Family Medicine

## 2010-06-09 ENCOUNTER — Encounter: Payer: Self-pay | Admitting: Family Medicine

## 2010-06-15 ENCOUNTER — Encounter (INDEPENDENT_AMBULATORY_CARE_PROVIDER_SITE_OTHER): Payer: Self-pay | Admitting: Pharmacist

## 2010-06-21 ENCOUNTER — Encounter: Payer: Self-pay | Admitting: Family Medicine

## 2010-06-28 ENCOUNTER — Ambulatory Visit: Payer: Self-pay | Admitting: Family Medicine

## 2010-06-28 ENCOUNTER — Encounter: Payer: Self-pay | Admitting: Family Medicine

## 2010-06-28 LAB — CONVERTED CEMR LAB
BUN: 12 mg/dL (ref 6–23)
Creatinine, Ser: 0.77 mg/dL (ref 0.40–1.20)
Hgb A1c MFr Bld: 13.1 %
MCV: 88.7 fL (ref 78.0–100.0)
Potassium: 3.9 meq/L (ref 3.5–5.3)
RBC: 4.26 M/uL (ref 3.87–5.11)
TSH: 11.623 microintl units/mL — ABNORMAL HIGH (ref 0.350–4.500)
Vitamin B-12: 391 pg/mL (ref 211–911)
WBC: 4.1 10*3/uL (ref 4.0–10.5)

## 2010-07-03 ENCOUNTER — Encounter: Payer: Self-pay | Admitting: Family Medicine

## 2010-07-03 ENCOUNTER — Telehealth: Payer: Self-pay | Admitting: *Deleted

## 2010-09-14 ENCOUNTER — Encounter: Payer: Self-pay | Admitting: Family Medicine

## 2010-09-18 ENCOUNTER — Telehealth: Payer: Self-pay | Admitting: *Deleted

## 2010-09-18 ENCOUNTER — Other Ambulatory Visit: Payer: Self-pay | Admitting: Family Medicine

## 2010-09-18 DIAGNOSIS — Z1231 Encounter for screening mammogram for malignant neoplasm of breast: Secondary | ICD-10-CM

## 2010-09-18 DIAGNOSIS — Z1239 Encounter for other screening for malignant neoplasm of breast: Secondary | ICD-10-CM

## 2010-09-18 NOTE — Letter (Signed)
Summary: Generic Letter  [facility]  [address]   [place], [place] [address]   Phone: [phone]  Fax: [phone]    09/01/2009  [place] [address] [place], [place]  [address]  To Whom it May Concern,  Due to insulin-dependent diabetes that requires Ms. Mineo to frequently check her blood sugars and have access to food and restroom at anytime, she is unable to serve on a jury.  She cannot meet the physical and behavioral demands required by jury duty.  If you have any questions, please contact out office.            Sincerely,   Orland Mustard  MD

## 2010-09-18 NOTE — Miscellaneous (Signed)
Summary: Orders Update  Clinical Lists Changes  Problems: Added new problem of ENCOUNTER FOR LONG-TERM USE OF OTHER MEDICATIONS (ICD-V58.69) Orders: Added new Test order of B12-[facility] [phone]) - Signed Added new Test order of CBC-[facility] [id number]) - Signed  OK per Dr. Loraine Maple

## 2010-09-18 NOTE — Miscellaneous (Signed)
Summary: FLP order  Clinical Lists Changes  Orders: Added new Test order of Lipid-[facility] [id number]) - Signed Added new Test order of Basic Met-[facility] [phone]) - Signed Added new Test order of A1C-[facility] [id number]) - Signed Observations: Added new observation of LIPID PROGRS: N/A (05/25/2010 11:10) Added new observation of LIPID FSREVW: N/A (05/25/2010 11:10)      Prevention & Chronic Care Immunizations   Influenza vaccine: Fluvax Non-MCR  (05/13/2008)   Influenza vaccine due: 07/09/2008    Tetanus booster: 10/22/2004: Done.   Tetanus booster due: 10/23/2014    Pneumococcal vaccine: Done.  (07/19/1994)   Pneumococcal vaccine due: None    H. zoster vaccine: Not documented  Colorectal Screening   Hemoccult: Done.  (12/22/2004)   Hemoccult due: Not Indicated    Colonoscopy: Done.  (12/22/2004)   Colonoscopy due: 12/23/2014  Other Screening   Pap smear: Done.  (07/24/2005)   Pap smear due: 07/24/2006    Mammogram: normal  (09/27/2008)   Mammogram due: 09/27/2009    DXA bone density scan: abnormal  (04/06/2007)   DXA scan due: 04/2009    Smoking status: never  (03/12/2010)  Diabetes Mellitus   HgbA1C: 10.5  (01/23/2010)   HgbA1C action/deferral: Not indicated  (04/20/2009)   Hemoglobin A1C due: 10/25/2007    Eye exam: Not documented    Foot exam: yes  (11/04/2008)   High risk foot: Not documented   Foot care education: Not documented   Foot exam due: 03/16/2008    Urine microalbumin/creatinine ratio: Not documented  Lipids   Total Cholesterol: Not documented   LDL: 89  (02/17/2008)   LDL Direct: 89  (02/17/2008)   HDL: Not documented   Triglycerides: Not documented  Hypertension   Last Blood Pressure: 129 / 87  (03/12/2010)   Serum creatinine: 0.84  (05/11/2008)   Serum potassium 4.2  (05/11/2008)  Self-Management Support :   Personal Goals (by the next clinic visit) :     Personal A1C goal: 9  (04/20/2009)     Personal blood pressure goal: 130/80   (04/20/2009)     Personal LDL goal: 100  (04/20/2009)    Diabetes self-management support: CBG self-monitoring log, Written self-care plan  (11/17/2009)    Hypertension self-management support: Written self-care plan  (11/17/2009)

## 2010-09-18 NOTE — Assessment & Plan Note (Signed)
Summary: F/U diabetes - Rx Clinic   Vital Signs:  Patient profile:   65 year old female Height:      62 inches Weight:      162 pounds BMI:     29.74 Pulse rate:   105 / minute BP sitting:   135 / 78  (left arm)   Primary Care Provider:  Orland Mustard  MD   History of Present Illness: Patient arrives in good mood.  States she has been rushing to get here.   Reports one episode of nocturnal hypoglycemia which was appropriately managed at home.   She has been checking her blood sugars less because of pain in her fingers.   Her 14 day avg CBGs have improved = 142 compared to her 30 day average 215.  She states " I am so happy to have sugars in the 80s without feeling low.  I really didn't think that I could do this."    Current Medications (verified): 1)  Methotrexate 2.5 Mg  Tabs (Methotrexate Sodium) .... 5 Tablets By Mouth Every Monday and Tuesday 2)  Bayer Aspirin 325 Mg Tabs (Aspirin) .Marland Kitchen.. 1 Tablet Daily 3)  Folic Acid Q000111Q Mcg  Tabs (Folic Acid) .Marland Kitchen.. 1 Tablet By Mouth Daily 4)  Hydrochlorothiazide 25 Mg  Tabs (Hydrochlorothiazide) .Marland Kitchen.. 1 Tablet By Mouth Daily 5)  Synthroid 200 Mcg Tabs (Levothyroxine Sodium) .Marland Kitchen.. 1 Tablet By Mouth Daily 6)  Accupril 40 Mg  Tabs (Quinapril Hcl) .Marland Kitchen.. 1 Tablet By Mouth Daily 7)  Multivitamins   Tabs (Multiple Vitamin) .Marland Kitchen.. 1 Tablet By Mouth Daily 8)  Nexium 40 Mg  Pack (Esomeprazole Magnesium) .Marland Kitchen.. 1 Tablet By Mouth Daily 9)  Calcium 600 Mg  Tabs (Calcium) .Marland Kitchen.. 1 Tablet By Mouth Daily 10)  Lantus Solostar 100 Unit/ml Soln (Insulin Glargine) .... 30 Units Two Times A Day For Diabetes Disp: Qs 1 Month 11)  Metformin Hcl 1000 Mg  Tabs (Metformin Hcl) .Marland Kitchen.. 1 Tablet By Mouth Bid 12)  Neurontin 800 Mg Tabs (Gabapentin) .Marland Kitchen.. 1 Tablet By Mouth Daily 13)  Flonase 50 Mcg/act Susp (Fluticasone Propionate) .... 2 Sprays Each Nostril Daily 14)  Novolog Flexpen 100 Unit/ml Soln (Insulin Aspart) .... Inject 10 Units Prior To Lunch.   Dispense One Month  Supply. 15)  Prodigy Blood Glucose Monitor  Devi (Blood Glucose Monitoring Suppl) .... Please Supply Any Glucose Test Device Available For Patient.  Patient Lost Device. 16)  Prodigy No Coding Blood Gluc  Strp (Glucose Blood) .... Please Supply Quantity Sufficient For 3 Times Daily Blood Glucose Testing.   Please Match To Blood Glucose Testing Device Available. 17)  Bd Pen Needle Short U/f 31g X 8 Mm Misc (Insulin Pen Needle) .... Supply Quantity Sufficient For One Month Supply of 3 Times Daily Injections.  Allergies (verified): 1)  Avandia (Rosiglitazone Maleate)   Impression & Recommendations:  Problem # 1:  DIABETES MELLITUS, II, COMPLICATIONS (A999333) Assessment Improved  Diabetes of: longstanding duration currently under: improved control of blood glucose based on home CBGs of: 142 ( 14 day average) Control is suboptimal due to: dietary indiscretion.  Hypoglycemic event managed appropriately.  Reviewed appropriate hypoglycemia management plan. Continuedl insulin same doses.  Lantus 30 BID and Novolog 10 units prior to lunch.  Consider increasing novolog pre-lunch if A1C remains elevated at next visit.  Written pt instructions provided:  Needs A1C next visit - also could have lipid panel (last done in 2009).  F/U with Dr. Nadara Eaton in 3-4 weeks.  F/U Rx Clinic  Visit: at the direction of Dr. Nadara Eaton.  TTFFC: 30 mins.  Pt seen with: Judson Roch, PharmD Resident  Her updated medication list for this problem includes:    Bayer Aspirin 325 Mg Tabs (Aspirin) .Marland Kitchen... 1 tablet daily    Accupril 40 Mg Tabs (Quinapril hcl) .Marland Kitchen... 1 tablet by mouth daily    Lantus Solostar 100 Unit/ml Soln (Insulin glargine) .Marland KitchenMarland KitchenMarland KitchenMarland Kitchen 30 units two times a day for diabetes disp: qs 1 month    Metformin Hcl 1000 Mg Tabs (Metformin hcl) .Marland Kitchen... 1 tablet by mouth bid    Novolog Flexpen 100 Unit/ml Soln (Insulin aspart) ..... Inject 10 units prior to lunch.   dispense one month supply.  Orders: [facility]assessment Each [facility] [id number])  Complete Medication List: 1)  Methotrexate 2.5 Mg Tabs (Methotrexate sodium) .... 5 tablets by mouth every monday and tuesday 2)  Bayer Aspirin 325 Mg Tabs (Aspirin) .Marland Kitchen.. 1 tablet daily 3)  Folic Acid Q000111Q Mcg Tabs (Folic acid) .Marland Kitchen.. 1 tablet by mouth daily 4)  Hydrochlorothiazide 25 Mg Tabs (Hydrochlorothiazide) .Marland Kitchen.. 1 tablet by mouth daily 5)  Synthroid 200 Mcg Tabs (Levothyroxine sodium) .Marland Kitchen.. 1 tablet by mouth daily 6)  Accupril 40 Mg Tabs (Quinapril hcl) .Marland Kitchen.. 1 tablet by mouth daily 7)  Multivitamins Tabs (Multiple vitamin) .Marland Kitchen.. 1 tablet by mouth daily 8)  Nexium 40 Mg Pack (Esomeprazole magnesium) .Marland Kitchen.. 1 tablet by mouth daily 9)  Calcium 600 Mg Tabs (Calcium) .Marland Kitchen.. 1 tablet by mouth daily 10)  Lantus Solostar 100 Unit/ml Soln (Insulin glargine) .... 30 units two times a day for diabetes disp: qs 1 month 11)  Metformin Hcl 1000 Mg Tabs (Metformin hcl) .Marland Kitchen.. 1 tablet by mouth bid 12)  Neurontin 800 Mg Tabs (Gabapentin) .Marland Kitchen.. 1 tablet by mouth daily 13)  Flonase 50 Mcg/act Susp (Fluticasone propionate) .... 2 sprays each nostril daily 14)  Novolog Flexpen 100 Unit/ml Soln (Insulin aspart) .... Inject 10 units prior to lunch.   dispense one month supply. 15)  Prodigy Blood Glucose Monitor Devi (Blood glucose monitoring suppl) .... Please supply any glucose test device available for patient.  patient lost device. 16)  Prodigy No Coding Blood Gluc Strp (Glucose blood) .... Please supply quantity sufficient for 3 times daily blood glucose testing.   please match to blood glucose testing device available. 17)  Bd Pen Needle Short U/f 31g X 8 Mm Misc (Insulin pen needle) .... Supply quantity sufficient for one month supply of 3 times daily injections.  Patient Instructions: 1)  Happy Birthday! 2)  Keep up the GREAT work you are doing.  3)  Continue taking your Novolog 10 units prior to lunch.  4)  Continue taking your Lantus 30 units TWICE daily. 5)  Work hard to eat well! 6)  Keep  active.  7)  Follow Up with Dr. Nadara Eaton in 3-4 weeks!  Prevention & Chronic Care Immunizations   Influenza vaccine: Fluvax Non-MCR  (05/13/2008)   Influenza vaccine due: 07/09/2008    Tetanus booster: 10/22/2004: Done.   Tetanus booster due: 10/23/2014    Pneumococcal vaccine: Done.  (07/19/1994)   Pneumococcal vaccine due: None    H. zoster vaccine: Not documented  Colorectal Screening   Hemoccult: Done.  (12/22/2004)   Hemoccult due: Not Indicated    Colonoscopy: Done.  (12/22/2004)   Colonoscopy due: 12/23/2014  Other Screening   Pap smear: Done.  (07/24/2005)   Pap smear due: 07/24/2006    Mammogram: normal  (09/27/2008)   Mammogram due: 09/27/2009  DXA bone density scan: abnormal  (04/06/2007)   DXA scan due: 04/2009    Smoking status: never  (09/01/2009)  Diabetes Mellitus   HgbA1C: 13.7  (09/01/2009)   HgbA1C action/deferral: Not indicated  (04/20/2009)   Hemoglobin A1C due: 10/25/2007    Eye exam: Not documented    Foot exam: yes  (11/04/2008)   High risk foot: Not documented   Foot care education: Not documented   Foot exam due: 03/16/2008    Urine microalbumin/creatinine ratio: Not documented    Diabetes flowsheet reviewed?: Yes   Progress toward A1C goal: Improved    Stage of readiness to change (diabetes management): Action  Lipids   Total Cholesterol: Not documented   LDL: 89  (02/17/2008)   LDL Direct: 89  (02/17/2008)   HDL: Not documented   Triglycerides: Not documented  Hypertension   Last Blood Pressure: 135 / 78  (11/17/2009)   Serum creatinine: 0.84  (05/11/2008)   Serum potassium 4.2  (05/11/2008)    Hypertension flowsheet reviewed?: Yes   Progress toward BP goal: Unchanged  Self-Management Support :   Personal Goals (by the next clinic visit) :     Personal A1C goal: 9  (04/20/2009)     Personal blood pressure goal: 130/80  (04/20/2009)     Personal LDL goal: 100  (04/20/2009)    Diabetes self-management support: CBG  self-monitoring log, Written self-care plan  (11/17/2009)   Diabetes care plan printed    Hypertension self-management support: Written self-care plan  (11/17/2009)   Hypertension self-care plan printed.

## 2010-09-18 NOTE — Assessment & Plan Note (Signed)
Summary: Diabetes - Insulin Lantus/Novolog Pens - Rx Clinic   Vital Signs:  Patient profile:   65 year old female Height:      62 inches Weight:      156 pounds BMI:     28.64 Pulse rate:   82 / minute BP sitting:   134 / 74  (right arm)  Primary Care Provider:  Orland Mustard  MD   History of Present Illness: [name] is a 65 yo AAF presenting to Rx Clinic today for diabetes management.  Patient states her glucose has been "bad".  She received defective test strips and her glucose readings were incorrect, but she obtained new strips, however she now needs a new glucometer and test strips.  AM glucose ranging from 110-278, pre-dinner 176-284, and bedtime 177-340 over the last month.  She reports worry about getting low blood sugars.  She acknowledged that she fears dying in her sleep from low blood sugars.   Patient reports taking Lantus with use of vial and syringe at a dose of 35 units most morning and 35 units  in the evening.  She admits to only taking the second dose of insulin 4 out of 7 days.  She also finished a 1 month course of Prednisone 2 months ago, but she does not recall a difference in her glucose during that time.  Patient is concerned about the lunches provided at the [facility] because they have a "lot of desserts".  Provided the February lunch menu for review, which includes at least 2-3 servings of carbs per meal.  She rarely eats breakfast, snack is a candy bar, and dinner is a sandwich or burger.  She is exercising at the [facility] 3x per week (walking, zumba, and yoga).        Current Medications (verified): 1)  Methotrexate 2.5 Mg  Tabs (Methotrexate Sodium) .... 5 Tablets By Mouth Every Monday and Tuesday 2)  Bayer Aspirin 325 Mg Tabs (Aspirin) .Marland Kitchen.. 1 Tablet Daily 3)  Folic Acid Q000111Q Mcg  Tabs (Folic Acid) .Marland Kitchen.. 1 Tablet By Mouth Daily 4)  Hydrochlorothiazide 25 Mg  Tabs (Hydrochlorothiazide) .Marland Kitchen.. 1 Tablet By Mouth Daily 5)  Synthroid 200 Mcg Tabs  (Levothyroxine Sodium) .Marland Kitchen.. 1 Tablet By Mouth Daily 6)  Accupril 40 Mg  Tabs (Quinapril Hcl) .Marland Kitchen.. 1 Tablet By Mouth Daily 7)  Multivitamins   Tabs (Multiple Vitamin) .Marland Kitchen.. 1 Tablet By Mouth Daily 8)  Nexium 40 Mg  Pack (Esomeprazole Magnesium) .Marland Kitchen.. 1 Tablet By Mouth Daily 9)  Calcium 600 Mg  Tabs (Calcium) .Marland Kitchen.. 1 Tablet By Mouth Daily 10)  Lantus Solostar 100 Unit/ml Soln (Insulin Glargine) .... 30 Units Two Times A Day For Diabetes Disp: Qs 1 Month 11)  Metformin Hcl 1000 Mg  Tabs (Metformin Hcl) .Marland Kitchen.. 1 Tablet By Mouth Bid 12)  Neurontin 800 Mg Tabs (Gabapentin) .Marland Kitchen.. 1 Tablet By Mouth Daily 13)  Flonase 50 Mcg/act Susp (Fluticasone Propionate) .... 2 Sprays Each Nostril Daily 14)  Novolog Flexpen 100 Unit/ml Soln (Insulin Aspart) .... Inject 10 Units Prior To Lunch.   Dispense One Month Supply.  Allergies (verified): 1)  Avandia (Rosiglitazone Maleate)   Impression & Recommendations:  Problem # 1:  DIABETES MELLITUS, II, COMPLICATIONS (A999333) Assessment Deteriorated  Patient's DM is currently uncontrolled with an A1c=13.7% 09/01/09. Goal A1c=7% with fasting glucose=70-130 mg/dL.  Based on pre-dinner hyperglycemia, added Novolog 10 units prior to lunch.  Due to patient's concern regarding hyperglycemia, decreased Lantus to 30 units BID and emphasized  need for compliance with BID dosing.  Patient was hesitant about the time it took to use the pen.  Educated on proper injection technique with the Lantus and Novolog pens, and patient gave herself the AM Lantus dose in the exam room.  Monitor glucose 3x daily with at least 1 fasting AM reading daily.  Rx sent for new meter and strips.  Given samples of Lantus and Novolog.  Educated on appropriate DM diet, and patient is to eat rice, potatoes, and dessert no more than 1x week.  Continue current exercise regimen.  Return to Rx Clinic 10/13/09 at 11:15 for follow-up.  TTFFC:  50 minutes.   Seen with Gwynneth Albright, PharmD Resident Her updated  medication list for this problem includes:    Bayer Aspirin 325 Mg Tabs (Aspirin) .Marland Kitchen... 1 tablet daily    Accupril 40 Mg Tabs (Quinapril hcl) .Marland Kitchen... 1 tablet by mouth daily    Lantus Solostar 100 Unit/ml Soln (Insulin glargine) .Marland KitchenMarland KitchenMarland KitchenMarland Kitchen 30 units two times a day for diabetes disp: qs 1 month    Metformin Hcl 1000 Mg Tabs (Metformin hcl) .Marland Kitchen... 1 tablet by mouth bid    Novolog Flexpen 100 Unit/ml Soln (Insulin aspart) ..... Inject 10 units prior to lunch.   dispense one month supply.  Orders: Inital Assessment Each 52min - [place] [phone])  Complete Medication List: 1)  Methotrexate 2.5 Mg Tabs (Methotrexate sodium) .... 5 tablets by mouth every monday and tuesday 2)  Bayer Aspirin 325 Mg Tabs (Aspirin) .Marland Kitchen.. 1 tablet daily 3)  Folic Acid Q000111Q Mcg Tabs (Folic acid) .Marland Kitchen.. 1 tablet by mouth daily 4)  Hydrochlorothiazide 25 Mg Tabs (Hydrochlorothiazide) .Marland Kitchen.. 1 tablet by mouth daily 5)  Synthroid 200 Mcg Tabs (Levothyroxine sodium) .Marland Kitchen.. 1 tablet by mouth daily 6)  Accupril 40 Mg Tabs (Quinapril hcl) .Marland Kitchen.. 1 tablet by mouth daily 7)  Multivitamins Tabs (Multiple vitamin) .Marland Kitchen.. 1 tablet by mouth daily 8)  Nexium 40 Mg Pack (Esomeprazole magnesium) .Marland Kitchen.. 1 tablet by mouth daily 9)  Calcium 600 Mg Tabs (Calcium) .Marland Kitchen.. 1 tablet by mouth daily 10)  Lantus Solostar 100 Unit/ml Soln (Insulin glargine) .... 30 units two times a day for diabetes disp: qs 1 month 11)  Metformin Hcl 1000 Mg Tabs (Metformin hcl) .Marland Kitchen.. 1 tablet by mouth bid 12)  Neurontin 800 Mg Tabs (Gabapentin) .Marland Kitchen.. 1 tablet by mouth daily 13)  Flonase 50 Mcg/act Susp (Fluticasone propionate) .... 2 sprays each nostril daily 14)  Novolog Flexpen 100 Unit/ml Soln (Insulin aspart) .... Inject 10 units prior to lunch.   dispense one month supply. 15)  Prodigy Blood Glucose Monitor Devi (Blood glucose monitoring suppl) .... Please supply any glucose test device available for patient.  patient lost device. 16)  Prodigy No Coding Blood Gluc Strp (Glucose blood)  .... Please supply quantity sufficient for 3 times daily blood glucose testing.   please match to blood glucose testing device available.  Patient Instructions: 1)  Decrease Lantus dose to 30 units  twice daily. 2)  Start Novolog 10 units before lunch daily (Give about 15-20 minutes before meal). 3)  Monitor blood sugar before breakfast daily, then check 2 other times of the day, either before lunch, before dinner, or before bedtime. 4)  Decrease amount of rice, potatoes, and bread at lunch.  Eat only 1 serving of these per week at [facility]. 5)  Continue exercising at [facility]. Prescriptions: PRODIGY NO CODING BLOOD GLUC  STRP (GLUCOSE BLOOD) Please supply quantity sufficient for 3 times daily  blood glucose testing.   Please match to blood glucose testing device available.  #1 x 0   Entered by:   Rinaldo Ratel D   Authorized by:   Orland Mustard  MD   Signed by:   Janeann Forehand Pharm D on 09/29/2009   Method used:   Faxed to ...       [facility] (retail)       [place], [place]  [address]       Ph: [phone]       Fax: [phone]   RxID:   [id number] PRODIGY BLOOD GLUCOSE MONITOR  DEVI (BLOOD GLUCOSE MONITORING SUPPL) Please supply any glucose test device available for patient.  Patient lost device.  #1 x 0   Entered by:   Rinaldo Ratel D   Authorized by:   Orland Mustard  MD   Signed by:   Janeann Forehand Pharm D on 09/29/2009   Method used:   Faxed to ...       [facility] (retail)       [place], [place]  [address]       Ph: [phone]       Fax: [phone]   RxID:   [id number] NOVOLOG FLEXPEN 100 UNIT/ML SOLN (INSULIN ASPART) Inject 10 units prior to lunch.   Dispense one month supply.  #1 x 5   Entered by:   Rinaldo Ratel D   Authorized by:   Orland Mustard  MD   Signed by:   Janeann Forehand Pharm D on 09/29/2009   Method used:   Faxed to ...       [facility] (retail)       [place], [place]  [address]       Ph: [phone]       Fax: [phone]   RxID:   [id number] LANTUS SOLOSTAR 100 UNIT/ML SOLN (INSULIN GLARGINE) 30 units two times a day for diabetes disp: QS 1 month  #1 x 5   Entered by:   Rinaldo Ratel D   Authorized by:   Orland Mustard  MD   Signed by:   Janeann Forehand Pharm D on 09/29/2009   Method used:   Faxed to ...       [facility] (retail)       [place],   [address]       Ph: [phone]       Fax: [phone]   RxID:   [id number]   Prevention & Chronic Care Immunizations   Influenza vaccine: Fluvax Non-MCR  (05/13/2008)   Influenza vaccine due: 07/09/2008    Tetanus booster: 10/22/2004: Done.   Tetanus booster due: 10/23/2014    Pneumococcal vaccine: Done.  (07/19/1994)   Pneumococcal vaccine due: None    H. zoster vaccine: Not documented  Colorectal Screening   Hemoccult: Done.  (12/22/2004)   Hemoccult due: Not Indicated    Colonoscopy: Done.  (12/22/2004)   Colonoscopy due: 12/23/2014  Other Screening   Pap smear: Done.  (07/24/2005)   Pap smear due: 07/24/2006    Mammogram: normal  (09/27/2008)   Mammogram due: 09/27/2009    DXA bone density scan: abnormal  (04/06/2007)   DXA scan due: 04/2009    Smoking status: never  (  09/01/2009)  Diabetes Mellitus   HgbA1C: 13.7  (09/01/2009)   HgbA1C action/deferral: Not indicated  (04/20/2009)   Hemoglobin A1C due: 10/25/2007    Eye exam: Not documented    Foot exam: yes  (11/04/2008)   High risk foot: Not documented   Foot care education: Not documented   Foot exam due: 03/16/2008    Urine microalbumin/creatinine ratio: Not documented    Diabetes flowsheet reviewed?: Yes   Progress toward A1C goal: Unchanged  Lipids   Total Cholesterol: Not documented   LDL: 89  (02/17/2008)   LDL Direct: 89  (02/17/2008)   HDL: Not documented    Triglycerides: Not documented  Hypertension   Last Blood Pressure: 134 / 74  (09/29/2009)   Serum creatinine: 0.84  (05/11/2008)   Serum potassium 4.2  (05/11/2008)    Hypertension flowsheet reviewed?: Yes   Progress toward BP goal: Improved  Self-Management Support :   Personal Goals (by the next clinic visit) :     Personal A1C goal: 9  (04/20/2009)     Personal blood pressure goal: 130/80  (04/20/2009)     Personal LDL goal: 100  (04/20/2009)    Diabetes self-management support: Copy of home glucose meter record, Written self-care plan, Pre-printed educational material  (09/29/2009)   Diabetes care plan printed    Hypertension self-management support: Not documented

## 2010-09-18 NOTE — Progress Notes (Signed)
  Phone Note Outgoing Call   Summary of Call: Left VM on pt's home phone # asking her to call office tomorrow.  Briefly, she needs to be notified that her TSH was elevated and we need to increase her synthroid dose.  She is currently taking 279mcg; we will increase to 216mcg.  She will likely need a new Rx for the 18mcg, but I do not know where she would like it sent. Initial call taken by: Lorin Glass MD,  July 02, 2010 7:48 PM  Follow-up for Phone Call        Pt need rx sent [place] Follow-up by: Eusebio Friendly,  July 03, 2010 9:56 AM  Additional Follow-up for Phone Call Additional follow up Details #1::        Rx has been put in and ?faxed.  I'm not sure if when I select "fax to pharmacy" it automatically sends it.  If not, can we please print and send? Thanks! Additional Follow-up by: Lorin Glass MD,  July 06, 2010 9:23 PM    Additional Follow-up for Phone Call Additional follow up Details #2::    Rx called into [facility] Follow-up by: Schuyler Amor CMA,  July 10, 2010 4:10 PM  New/Updated Medications: SYNTHROID 25 MCG TABS (LEVOTHYROXINE SODIUM) 1 tab by mouth daily with 235mcg pill Prescriptions: SYNTHROID 25 MCG TABS (LEVOTHYROXINE SODIUM) 1 tab by mouth daily with 289mcg pill  #90 x 2   Entered and Authorized by:   Lorin Glass MD   Signed by:   Lorin Glass MD on 07/06/2010   Method used:   Faxed to ...       [facility] (retail)       [address] [place], [place]  [address]       Ph: [phone]       Fax: [phone]   RxID:   [id number]

## 2010-09-18 NOTE — Miscellaneous (Signed)
Summary: Re: GI referral.  Clinical Lists Changes   received notification from [facility] that they are unable to complete the GI referral at this time due to lack of volunteer doctors in this speciality group. they will notify  patient when they can process the referral. Marcell Barlow RN  January 26, 2010 5:10 PM

## 2010-09-18 NOTE — Assessment & Plan Note (Signed)
Summary: f/u dm,df   Vital Signs:  Patient profile:   65 year old female Height:      62 inches Weight:      157 pounds BMI:     28.82 Temp:     98.0 degrees F oral Pulse rate:   87 / minute BP sitting:   170 / 82  (left arm) Cuff size:   regular  Vitals Entered By: Schuyler Amor CMA (September 01, 2009 2:16 PM) CC: F/U diabetes Is Patient Diabetic? Yes Pain Assessment Patient in pain? no        Primary Care Provider:  Orland Mustard  MD  CC:  F/U diabetes.  History of Present Illness: Ms. Parthasarathy comes in today for follow-up of Diabetes as well as upper respiratory symptoms. 1) Diabetes - her diabetes has been poorly controlled.  She is on Lantus and Metformin.  Previously she was involved in some type of study at [facility] involving her diabetes care and she had a meter through them.  She is no longer in that study and just recently received a letter stating that the meter or strips were defective and actually reading sugars to be less than they really were.  She just yesterday picked up a new meter but has not used it yet.  With her old meter she states her sugars were running 170-270, though her A1C of 13.7 today would not correspond with those.  She is currently supposed to take 50 units of Lantus in the morning but states sometimes she will take 30 in the morning and 20 at night.  She has had two times where she felt like her sugar might be low but when she checked it it was 140 and 170.  At last visit she was asked to check sugars regularly and return for quick follow-up but unfortunately did not.  2) Upper respiratory symptoms - She has had cough and posterior nasal drainage for 5 days.  No sore throat.  Coughing so much though her ribs hurt when she coughs. Has been using a lot of cough drops - not sugar free.   Habits & Providers  Alcohol-Tobacco-Diet     Tobacco Status: never  Current Medications (verified): 1)  Methotrexate 2.5 Mg  Tabs (Methotrexate Sodium) .... 5  Tablets By Mouth Every Monday and Tuesday 2)  Bayer Aspirin 325 Mg Tabs (Aspirin) .Marland Kitchen.. 1 Tablet Daily 3)  Folic Acid Q000111Q Mcg  Tabs (Folic Acid) .Marland Kitchen.. 1 Tablet By Mouth Daily 4)  Hydrochlorothiazide 25 Mg  Tabs (Hydrochlorothiazide) .Marland Kitchen.. 1 Tablet By Mouth Daily 5)  Synthroid 200 Mcg Tabs (Levothyroxine Sodium) .Marland Kitchen.. 1 Tablet By Mouth Daily 6)  Accupril 40 Mg  Tabs (Quinapril Hcl) .Marland Kitchen.. 1 Tablet By Mouth Daily 7)  Multivitamins   Tabs (Multiple Vitamin) .Marland Kitchen.. 1 Tablet By Mouth Daily 8)  Nexium 40 Mg  Pack (Esomeprazole Magnesium) .Marland Kitchen.. 1 Tablet By Mouth Daily 9)  Calcium 600 Mg  Tabs (Calcium) .Marland Kitchen.. 1 Tablet By Mouth Daily 10)  Lantus Solostar 100 Unit/ml Soln (Insulin Glargine) .... 35 Units Two Times A Day For Diabetes Disp: Qs 1 Month 11)  Metformin Hcl 1000 Mg  Tabs (Metformin Hcl) .Marland Kitchen.. 1 Tablet By Mouth Bid 12)  Neurontin 800 Mg Tabs (Gabapentin) .Marland Kitchen.. 1 Tablet By Mouth Daily 13)  Flonase 50 Mcg/act Susp (Fluticasone Propionate) .... 2 Sprays Each Nostril Daily  Allergies: 1)  Avandia (Rosiglitazone Maleate)  Physical Exam  General:  Well-developed,well-nourished,in no acute distress; alert,appropriate and cooperative throughout examination  vitals reviewed Head:  no sinus tenderness Eyes:  conjunctiva clear, no injection Ears:  bilateral TM's normal Nose:  nasal mucosa pale and edematous Mouth:  postnasal drip evident Lungs:  Normal respiratory effort, chest expands symmetrically. Lungs are clear to auscultation, no crackles or wheezes. Heart:  Normal rate and regular rhythm. S1 and S2 normal without gallop, murmur, click, rub or other extra sounds.   Impression & Recommendations:  Problem # 1:  DIABETES MELLITUS, II, COMPLICATIONS ([id number]) Assessment Deteriorated Will increase lantus and divide into two times a day dosing.  35 units two times a day.  Given record to record sugars 3x daily and patient is to return to pharmacy clinic in 1-2 weeks with these values for further  education and titration.  Is a significant bump in insulin dosing, but dividied.  No lows recently and A1C reflects much room for improvement.  Patient was seen with Lattie Haw, Pharmacy student, who spent extra time in counseling about insulin, glucose monitoring, etc.  Her updated medication list for this problem includes:    Bayer Aspirin 325 Mg Tabs (Aspirin) .Marland Kitchen... 1 tablet daily    Accupril 40 Mg Tabs (Quinapril hcl) .Marland Kitchen... 1 tablet by mouth daily    Lantus Solostar 100 Unit/ml Soln (Insulin glargine) .Marland KitchenMarland KitchenMarland KitchenMarland Kitchen 35 units two times a day for diabetes disp: qs 1 month    Metformin Hcl 1000 Mg Tabs (Metformin hcl) .Marland Kitchen... 1 tablet by mouth bid  Orders: A1C-[facility] [id number]) [place]- Est  Level 4 [id number])  Problem # 2:  URI (ICD-465.9) Assessment: New Rx for flonase to help with nasal congestion. The following medications were removed from the medication list:    Diclofenac Sodium 75 Mg Tbec (Diclofenac sodium) .Marland Kitchen... 1 tablet by mouth two times a day as needed for shoulder pain Her updated medication list for this problem includes:    Bayer Aspirin 325 Mg Tabs (Aspirin) .Marland Kitchen... 1 tablet daily  Orders: [place]- Est  Level 4 [id number])  Complete Medication List: 1)  Methotrexate 2.5 Mg Tabs (Methotrexate sodium) .... 5 tablets by mouth every monday and tuesday 2)  Bayer Aspirin 325 Mg Tabs (Aspirin) .Marland Kitchen.. 1 tablet daily 3)  Folic Acid Q000111Q Mcg Tabs (Folic acid) .Marland Kitchen.. 1 tablet by mouth daily 4)  Hydrochlorothiazide 25 Mg Tabs (Hydrochlorothiazide) .Marland Kitchen.. 1 tablet by mouth daily 5)  Synthroid 200 Mcg Tabs (Levothyroxine sodium) .Marland Kitchen.. 1 tablet by mouth daily 6)  Accupril 40 Mg Tabs (Quinapril hcl) .Marland Kitchen.. 1 tablet by mouth daily 7)  Multivitamins Tabs (Multiple vitamin) .Marland Kitchen.. 1 tablet by mouth daily 8)  Nexium 40 Mg Pack (Esomeprazole magnesium) .Marland Kitchen.. 1 tablet by mouth daily 9)  Calcium 600 Mg Tabs (Calcium) .Marland Kitchen.. 1 tablet by mouth daily 10)  Lantus Solostar 100 Unit/ml Soln (Insulin glargine) .... 35 units two times a day for  diabetes disp: qs 1 month 11)  Metformin Hcl 1000 Mg Tabs (Metformin hcl) .Marland Kitchen.. 1 tablet by mouth bid 12)  Neurontin 800 Mg Tabs (Gabapentin) .Marland Kitchen.. 1 tablet by mouth daily 13)  Flonase 50 Mcg/act Susp (Fluticasone propionate) .... 2 sprays each nostril daily  Patient Instructions: 1)  Your A1C is much worse today.  We need to get your sugars under better control! 2)  Please start taking 35 units of Lantus in the morning and 35 units at night everyday. 3)  Check your sugars 3 times a day and write them on the paper provided and bring them to your next appointment.  Check them first thing in the morning before breakfast.  Check your sugar before you eat dinner.  Check your sugar before you go to bed. 4)  Please schedule an appointment with the [facility] in 1-2 weeks and bring the sugar record with you.  THey will help you further adjust your insulin.  5)  If you need new prescriptions for strips or lancets or anything, just give [place] a call. 6)  Please come back to see me in 6-8 weeks.  Follow-up with pharmacy clinic as they direct.  Prescriptions: FLONASE 50 MCG/ACT SUSP (FLUTICASONE PROPIONATE) 2 sprays each nostril daily  #1 x 3   Entered and Authorized by:   Orland Mustard  MD   Signed by:   Orland Mustard  MD on 09/01/2009   Method used:   Print then Give to Patient   RxID:   [id number] LANTUS SOLOSTAR 100 UNIT/ML SOLN (INSULIN GLARGINE) 35 units two times a day for diabetes disp: QS 1 month  #1 x 3   Entered and Authorized by:   Orland Mustard  MD   Signed by:   Orland Mustard  MD on 09/01/2009   Method used:   Print then Give to Patient   RxID:   [id number]   Laboratory Results   Blood Tests   Date/Time Received: September 01, 2009 2:11 PM  Date/Time Reported: September 01, 2009 2:57 PM   HGBA1C: 13.7%   (Normal Range: Non-Diabetic - 3-6%   Control Diabetic - 6-8%)  Comments: ...........test performed by...........Marland KitchenHedy Camara, CMA      Prevention & Chronic  Care Immunizations   Influenza vaccine: Fluvax Non-MCR  (05/13/2008)   Influenza vaccine due: 07/09/2008    Tetanus booster: 10/22/2004: Done.   Tetanus booster due: 10/23/2014    Pneumococcal vaccine: Done.  (07/19/1994)   Pneumococcal vaccine due: None    H. zoster vaccine: Not documented  Colorectal Screening   Hemoccult: Done.  (12/22/2004)   Hemoccult due: Not Indicated    Colonoscopy: Done.  (12/22/2004)   Colonoscopy due: 12/23/2014  Other Screening   Pap smear: Done.  (07/24/2005)   Pap smear due: 07/24/2006    Mammogram: normal  (09/27/2008)   Mammogram due: 09/27/2009    DXA bone density scan: abnormal  (04/06/2007)   DXA scan due: 04/2009    Smoking status: never  (09/01/2009)  Diabetes Mellitus   HgbA1C: 13.7  (09/01/2009)   HgbA1C action/deferral: Not indicated  (04/20/2009)   Hemoglobin A1C due: 10/25/2007    Eye exam: Not documented    Foot exam: yes  (11/04/2008)   High risk foot: Not documented   Foot care education: Not documented   Foot exam due: 03/16/2008    Urine microalbumin/creatinine ratio: Not documented    Diabetes flowsheet reviewed?: Yes   Progress toward A1C goal: Deteriorated  Lipids   Total Cholesterol: Not documented   LDL: 89  (02/17/2008)   LDL Direct: 89  (02/17/2008)   HDL: Not documented   Triglycerides: Not documented  Hypertension   Last Blood Pressure: 170 / 82  (09/01/2009)   Serum creatinine: 0.84  (05/11/2008)   Serum potassium 4.2  (05/11/2008)    Hypertension flowsheet reviewed?: Yes   Progress toward BP goal: Deteriorated  Self-Management Support :   Personal Goals (by the next clinic visit) :     Personal A1C goal: 9  (04/20/2009)     Personal blood pressure goal: 130/80  (04/20/2009)     Personal LDL goal: 100  (04/20/2009)    Patient will work on the following items until  the next clinic visit to reach self-care goals:     Medications and monitoring: take my medicines every day, check my blood  sugar, check my blood pressure, bring all of my medications to every visit  (09/01/2009)     Eating: drink diet soda or water instead of juice or soda, eat more vegetables  (09/01/2009)    Diabetes self-management support: Copy of home glucose meter record, CBG self-monitoring log, Written self-care plan  (09/01/2009)   Diabetes care plan printed    Hypertension self-management support: Not documented

## 2010-09-18 NOTE — Assessment & Plan Note (Signed)
Summary: fall/ n/v/d   Vital Signs:  Patient profile:   65 year old female Height:      63 inches Weight:      152 pounds BMI:     27.02 Temp:     97.9 degrees F oral Pulse rate:   84 / minute BP sitting:   129 / 87  (right arm) Cuff size:   regular  Vitals Entered By: Enid Skeens, CMA (March 12, 2010 3:31 PM) CC: fell @ [employer] last week. since then has diarrhea and vomiting Is Patient Diabetic? Yes Pain Assessment Patient in pain? yes        Primary Care Provider:  Orland Mustard  MD  CC:  fell @ [employer] last week. since then has diarrhea and vomiting.  History of Present Illness: Right lower back pain, right neck pain: fall on wednesday at [place] tripped over small child, landed on right side,  now hurting on right lower back,  right neck, no fever, no stool or urine incontience.  nausea, dirrhea, vomiting: a few episodes- today only.  No recent exposure.  Unsure of cause.  decreased appetite.  has not been sick recently.  No fever, no blood in stool or vomit.  No sick contacts.    Habits & Providers  Alcohol-Tobacco-Diet     Alcohol drinks/day: 0     Tobacco Status: never     Diet Comments: diabetic     Diet Counseling: to improve diet; diet is suboptimal  Current Medications (verified): 1)  Methotrexate 2.5 Mg  Tabs (Methotrexate Sodium) .... 5 Tablets By Mouth Every Monday and Tuesday 2)  Bayer Aspirin 325 Mg Tabs (Aspirin) .Marland Kitchen.. 1 Tablet Daily 3)  Folic Acid Q000111Q Mcg  Tabs (Folic Acid) .Marland Kitchen.. 1 Tablet By Mouth Daily 4)  Hydrochlorothiazide 25 Mg  Tabs (Hydrochlorothiazide) .Marland Kitchen.. 1 Tablet By Mouth Daily 5)  Synthroid 200 Mcg Tabs (Levothyroxine Sodium) .Marland Kitchen.. 1 Tablet By Mouth Daily 6)  Accupril 40 Mg  Tabs (Quinapril Hcl) .Marland Kitchen.. 1 Tablet By Mouth Daily 7)  Multivitamins   Tabs (Multiple Vitamin) .Marland Kitchen.. 1 Tablet By Mouth Daily 8)  Nexium 40 Mg  Pack (Esomeprazole Magnesium) .Marland Kitchen.. 1 Tablet By Mouth Daily 9)  Calcium 600 Mg  Tabs (Calcium) .Marland Kitchen.. 1 Tablet By Mouth Daily 10)   Lantus Solostar 100 Unit/ml Soln (Insulin Glargine) .... 30 Units Two Times A Day For Diabetes Disp: Qs 1 Month 11)  Metformin Hcl 1000 Mg  Tabs (Metformin Hcl) .Marland Kitchen.. 1 Tablet By Mouth Bid 12)  Neurontin 800 Mg Tabs (Gabapentin) .Marland Kitchen.. 1 Tablet By Mouth Two Times A Day 13)  Flonase 50 Mcg/act Susp (Fluticasone Propionate) .... 2 Sprays Each Nostril Daily 14)  Novolog Flexpen 100 Unit/ml Soln (Insulin Aspart) .... Inject 12 Units Prior To Lunch.   Dispense One Month Supply. 15)  Prodigy Blood Glucose Monitor  Devi (Blood Glucose Monitoring Suppl) .... Please Supply Any Glucose Test Device Available For Patient.  Patient Lost Device. 16)  Prodigy No Coding Blood Gluc  Strp (Glucose Blood) .... Please Supply Quantity Sufficient For 3 Times Daily Blood Glucose Testing.   Please Match To Blood Glucose Testing Device Available. 17)  Bd Pen Needle Short U/f 31g X 8 Mm Misc (Insulin Pen Needle) .... Supply Quantity Sufficient For One Month Supply of 3 Times Daily Injections.  Allergies (verified): 1)  Avandia (Rosiglitazone Maleate)  Review of Systems       as per hpi   Physical Exam  General:  VSS Well-developed,well-nourished,in no acute  distress; alert,appropriate and cooperative throughout examination Lungs:  Normal respiratory effort, chest expands symmetrically. Lungs are clear to auscultation, no crackles or wheezes. Heart:  Normal rate and regular rhythm. S1 and S2 normal without gallop, murmur, click, rub or other extra sounds. Abdomen:  Bowel sounds positive,abdomen soft and non-tender without masses, organomegaly or hernias noted. Msk:  normal gait Extremities:  no edema Neurologic:  strength equal bilat in upper and lower ext. reflexes equal bilateral  Psych:  Pt speaking loudly.  Some pressured speech.  No SI/HI.  Denies auditory or visual hallucinations.    Impression & Recommendations:  Problem # 1:  BACK PAIN, RIGHT (ICD-724.5) Pt to use over the counter tylenol for pain  control.  Most likely muscular in nature.  No red flags.  Pt given red flags for return to care.  Pt to return in 1 week if not improving or sooner if worsening.   Her updated medication list for this problem includes:    Bayer Aspirin 325 Mg Tabs (Aspirin) .Marland Kitchen... 1 tablet daily  Orders: [place]- Est Level  3 [id number])  Problem # 2:  GASTROENTERITIS WITHOUT DEHYDRATION (ICD-558.9) No signs of dehydration.  Able to keep down fluids.  No active n/v/d while at clinic.  Pt to continue fluids and solids as able.  Pt to return if this doesn't resolve in 2-3 days.  Most likely viral etiology.   Problem # 3:  Preventive Health Care (ICD-V70.0) pt to return in 1 month for CPE with PCP.  See recommendations in previous note.   Complete Medication List: 1)  Methotrexate 2.5 Mg Tabs (Methotrexate sodium) .... 5 tablets by mouth every monday and tuesday 2)  Bayer Aspirin 325 Mg Tabs (Aspirin) .Marland Kitchen.. 1 tablet daily 3)  Folic Acid Q000111Q Mcg Tabs (Folic acid) .Marland Kitchen.. 1 tablet by mouth daily 4)  Hydrochlorothiazide 25 Mg Tabs (Hydrochlorothiazide) .Marland Kitchen.. 1 tablet by mouth daily 5)  Synthroid 200 Mcg Tabs (Levothyroxine sodium) .Marland Kitchen.. 1 tablet by mouth daily 6)  Accupril 40 Mg Tabs (Quinapril hcl) .Marland Kitchen.. 1 tablet by mouth daily 7)  Multivitamins Tabs (Multiple vitamin) .Marland Kitchen.. 1 tablet by mouth daily 8)  Nexium 40 Mg Pack (Esomeprazole magnesium) .Marland Kitchen.. 1 tablet by mouth daily 9)  Calcium 600 Mg Tabs (Calcium) .Marland Kitchen.. 1 tablet by mouth daily 10)  Lantus Solostar 100 Unit/ml Soln (Insulin glargine) .... 30 units two times a day for diabetes disp: qs 1 month 11)  Metformin Hcl 1000 Mg Tabs (Metformin hcl) .Marland Kitchen.. 1 tablet by mouth bid 12)  Neurontin 800 Mg Tabs (Gabapentin) .Marland Kitchen.. 1 tablet by mouth two times a day 13)  Flonase 50 Mcg/act Susp (Fluticasone propionate) .... 2 sprays each nostril daily 14)  Novolog Flexpen 100 Unit/ml Soln (Insulin aspart) .... Inject 12 units prior to lunch.   dispense one month supply. 15)  Prodigy Blood  Glucose Monitor Devi (Blood glucose monitoring suppl) .... Please supply any glucose test device available for patient.  patient lost device. 16)  Prodigy No Coding Blood Gluc Strp (Glucose blood) .... Please supply quantity sufficient for 3 times daily blood glucose testing.   please match to blood glucose testing device available. 17)  Bd Pen Needle Short U/f 31g X 8 Mm Misc (Insulin pen needle) .... Supply quantity sufficient for one month supply of 3 times daily injections.  Patient Instructions: 1)  return in 1 month for physical exam/pap   2)  return if nausea, vomiting, and diarrhea do not improve.  Drink lots of fluids. 3)  Can use tylenol 650mg  by mouth 2 x per day as needed for muscle pain.

## 2010-09-18 NOTE — Miscellaneous (Signed)
Summary: GI Colonoscopy referral  Clinical Lists Changes  Problems: Added new problem of SCREENING, COLON CANCER (ICD-V76.51) Orders: Added new Referral order of Gastroenterology Referral (GI) - Signed

## 2010-09-18 NOTE — Miscellaneous (Signed)
Summary: TSH order  Clinical Lists Changes  Orders: Added new Test order of [facility] [phone]) - Signed Added new Test order of [facility] [phone]) - Signed

## 2010-09-18 NOTE — Letter (Signed)
Summary: Generic Letter  [place], [place]    Phone:   Fax:     05/25/2010  [name] [address] [place], [place]  [address]  Dear [name],   I am writing to remind you of your appointment for a well-woman visit, including a PAP smear, on October 24. Please do not eat before coming to your appointment so that we can check your cholesterol levels so that you do not need to come back for a seperate lab appointment. Or, if it is easier for you, you may call the lab to make an appointment any time between now and the 24th so that you can pick which day you come in fasting (not eating).  I look forward to meeting you!      Sincerely,   Lorin Glass MD  Appended Document: Generic Letter mailed.

## 2010-09-18 NOTE — Progress Notes (Signed)
Summary: pt needs appt/ts  ---- Converted from flag ---- ---- 08/28/2009 8:51 AM, Orland Mustard  MD wrote: Could you please notify [name] that her insulin has been refilled for one more month but she needs to make an appointment for a diabetes check before I can give further refills.  Thanks! ------------------------------  called pt. lmvm to sched. ov with dr.mayans.

## 2010-09-18 NOTE — Assessment & Plan Note (Signed)
Summary: cpe/pap,df   Vital Signs:  Patient profile:   65 year old female Height:      63 inches Weight:      156 pounds BMI:     27.73 Pulse rate:   100 / minute BP sitting:   148 / 70  (left arm) Cuff size:   regular  Vitals Entered By: Schuyler Amor CMA (June 28, 2010 2:18 PM) CC: CPP, Hypertension Management Is Patient Diabetic? Yes Did you bring your meter with you today? No Pain Assessment Patient in pain? no        Primary Provider:  Lorin Glass MD  CC:  CPP and Hypertension Management.  History of Present Illness: Pt comes in for a well woman exam. No specific concerns other than some mild chest pain when she gets angry.  Feels like her chest tightens but resolves spontaneously when she calms herself down and is no longer angry.  No complaints of HA, blurry vision, or shortness of breath. Mild BLE edema at the end of the day.   Has been drinking a lot of juice and eating a lot of oodles of noodles.  No sores on her feet.  Continues to have some peripheral neuropathy, but does not feel it is worsening.  Saw eye doctor earlier this year (Dr. Rocky Morel opthomalogy).  No new problems or concerns, per pt.  Did not bring meter but highest sugars over the past 1-2 weeks have been 214ish.   Rheumatologist had warned pt she may have mouth sores 2/2 methotrexate for her arthritis, but she has not been experiencing this yet.    Hypertension History:      She complains of chest pain and peripheral edema, but denies headache, palpitations, dyspnea with exertion, visual symptoms, neurologic problems, and side effects from treatment.  She notes no problems with any antihypertensive medication side effects.        Positive major cardiovascular risk factors include female age 65 years old or older, diabetes, hyperlipidemia, and hypertension.  Negative major cardiovascular risk factors include negative family history for ischemic heart disease and non-tobacco-user status.       Diabetes History Type of Diabetes: Type 2 Diabetes Mellitus  Current Medications (verified): 1)  Methotrexate 2.5 Mg  Tabs (Methotrexate Sodium) .... 5 Tablets By Mouth Every Monday and Tuesday 2)  Bayer Aspirin 325 Mg Tabs (Aspirin) .Marland Kitchen.. 1 Tablet Daily 3)  Folic Acid Q000111Q Mcg  Tabs (Folic Acid) .Marland Kitchen.. 1 Tablet By Mouth Daily 4)  Hydrochlorothiazide 25 Mg  Tabs (Hydrochlorothiazide) .Marland Kitchen.. 1 Tablet By Mouth Daily 5)  Synthroid 200 Mcg Tabs (Levothyroxine Sodium) .Marland Kitchen.. 1 Tablet By Mouth Daily 6)  Accupril 40 Mg  Tabs (Quinapril Hcl) .Marland Kitchen.. 1 Tablet By Mouth Daily 7)  Multivitamins   Tabs (Multiple Vitamin) .Marland Kitchen.. 1 Tablet By Mouth Daily 8)  Nexium 40 Mg  Pack (Esomeprazole Magnesium) .Marland Kitchen.. 1 Tablet By Mouth Daily 9)  Calcium 600 Mg  Tabs (Calcium) .Marland Kitchen.. 1 Tablet By Mouth Daily 10)  Lantus Solostar 100 Unit/ml Soln (Insulin Glargine) .... 30 Units Two Times A Day For Diabetes Disp: Qs 1 Month 11)  Metformin Hcl 1000 Mg  Tabs (Metformin Hcl) .Marland Kitchen.. 1 Tablet By Mouth Bid 12)  Neurontin 800 Mg Tabs (Gabapentin) .Marland Kitchen.. 1 Tablet By Mouth Three Times A Day 13)  Flonase 50 Mcg/act Susp (Fluticasone Propionate) .... 2 Sprays Each Nostril Daily 14)  Novolog Flexpen 100 Unit/ml Soln (Insulin Aspart) .... Inject 12 Units Prior To Lunch.   Dispense One  Month Supply. 15)  Prodigy Blood Glucose Monitor  Devi (Blood Glucose Monitoring Suppl) .... Please Supply Any Glucose Test Device Available For Patient.  Patient Lost Device. 16)  Prodigy No Coding Blood Gluc  Strp (Glucose Blood) .... Please Supply Quantity Sufficient For 3 Times Daily Blood Glucose Testing.   Please Match To Blood Glucose Testing Device Available. 17)  Bd Pen Needle Short U/f 31g X 8 Mm Misc (Insulin Pen Needle) .... Supply Quantity Sufficient For One Month Supply of 3 Times Daily Injections.  Allergies (verified): 1)  Avandia (Rosiglitazone Maleate)  Physical Exam  General:  alert, well-nourished, and well-hydrated.   Eyes:  vision grossly  intact, pupils equal, pupils round, pupils reactive to light, pupils react to accomodation, and corneas and lenses clear.   Mouth:  no gingival abnormalities, no dental plaque, pharynx pink and moist, and fair dentition.   Breasts:  skin/areolae normal, no masses, and no nipple discharge.  tenderness on left breast, inner, upper quadrent, with light touch. no masses appreciated.  Lungs:  normal breath sounds.   Heart:  normal rate, regular rhythm, and no murmur.   Abdomen:  soft, non-tender, and normal bowel sounds.   Genitalia:  Pelvic Exam:        External: normal female genitalia without lesions or masses        Vagina: normal without lesions or masses        Cervix: normal without lesions or masses        Adnexa: normal bimanual exam without masses or fullness        Uterus: normal by palpation        Pap smear: performed  Diabetes Management Exam:    Foot Exam (with socks and/or shoes not present):       Sensory-Pinprick/Light touch:          Left medial foot (L-4): diminished          Left dorsal foot (L-5): diminished          Left lateral foot (S-1): diminished          Right medial foot (L-4): diminished          Right dorsal foot (L-5): diminished          Right lateral foot (S-1): diminished       Sensory-Monofilament:          Left foot: diminished          Right foot: diminished       Inspection:          Left foot: normal          Right foot: normal       Nails:          Left foot: thickened          Right foot: thickened    Eye Exam:       Eye Exam done elsewhere          Date: 04/19/2010          Results: diabetic retinopathy          Done by: Virl Cagey   Impression & Recommendations:  Problem # 1:  Gynecological examination-routine (ICD-V72.31) Assessment Comment Only Pap smear preformed.  Pt not sexually active.  Could consider stopping pap smears if this one is normal. Pt amenable.  Will continue to see her on a regular basis for DM and HTN mgmt.  Also  strongly suggested that she go for a mammogram, especially because  of breast tenderness.  She will call and schedule an appt.   Problem # 2:  DIABETES MELLITUS, II, COMPLICATIONS (A999333) Assessment: Deteriorated A1c worse.  13.1 today, from 10's 4 months ago.  Pt did not bring meter today.  Strongly recommended that she brings it to her next follow up in 3 months.  Difficult to judge if neuropathy is worse  than previously since this is the first time I am seeing her, but per pt, it has remained the same.  She is being followed by opthamologist. States that she goes yearly and there have been no acute changes in her vision since seeing Dr. Virl Cagey.   Asked pt to please come in for FLP before next visit so that we can assess cholesterol, which is likely elevated.  Her updated medication list for this problem includes:    Bayer Aspirin 325 Mg Tabs (Aspirin) .Marland Kitchen... 1 tablet daily    Accupril 40 Mg Tabs (Quinapril hcl) .Marland Kitchen... 1 tablet by mouth daily    Lantus Solostar 100 Unit/ml Soln (Insulin glargine) .Marland KitchenMarland KitchenMarland KitchenMarland Kitchen 30 units two times a day for diabetes disp: qs 1 month    Metformin Hcl 1000 Mg Tabs (Metformin hcl) .Marland Kitchen... 1 tablet by mouth bid    Novolog Flexpen 100 Unit/ml Soln (Insulin aspart) ..... Inject 12 units prior to lunch.   dispense one month supply.  Problem # 3:  HYPERTENSION, BENIGN SYSTEMIC (ICD-401.1) Assessment: Unchanged BP elevated today from last visit, but appears to be within her normal range when looking back over the past year.  Pt states that she is taking all of her medications regularly.  However, she is drinking a lot of juice and having a lot of high sodium foods, such as oddles of noodles.  Discussed some necessary diet changes.  Pt states that she will try, but understands that we may need to add another BP med if it is not improved at next follow up.  Will check BMP for kidney fxn and electrolytes.  Her updated medication list for this problem includes:     Hydrochlorothiazide 25 Mg Tabs (Hydrochlorothiazide) .Marland Kitchen... 1 tablet by mouth daily    Accupril 40 Mg Tabs (Quinapril hcl) .Marland Kitchen... 1 tablet by mouth daily  Problem # 4:  OTHER POSTABLATIVE HYPOTHYROIDISM (ICD-244.1) Assessment: Comment Only Will check TSH today. to assess if needs any changes in medications.  Her updated medication list for this problem includes:    Synthroid 200 Mcg Tabs (Levothyroxine sodium) .Marland Kitchen... 1 tablet by mouth daily  Complete Medication List: 1)  Methotrexate 2.5 Mg Tabs (Methotrexate sodium) .... 5 tablets by mouth every monday and tuesday 2)  Bayer Aspirin 325 Mg Tabs (Aspirin) .Marland Kitchen.. 1 tablet daily 3)  Folic Acid Q000111Q Mcg Tabs (Folic acid) .Marland Kitchen.. 1 tablet by mouth daily 4)  Hydrochlorothiazide 25 Mg Tabs (Hydrochlorothiazide) .Marland Kitchen.. 1 tablet by mouth daily 5)  Synthroid 200 Mcg Tabs (Levothyroxine sodium) .Marland Kitchen.. 1 tablet by mouth daily 6)  Accupril 40 Mg Tabs (Quinapril hcl) .Marland Kitchen.. 1 tablet by mouth daily 7)  Multivitamins Tabs (Multiple vitamin) .Marland Kitchen.. 1 tablet by mouth daily 8)  Nexium 40 Mg Pack (Esomeprazole magnesium) .Marland Kitchen.. 1 tablet by mouth daily 9)  Calcium 600 Mg Tabs (Calcium) .Marland Kitchen.. 1 tablet by mouth daily 10)  Lantus Solostar 100 Unit/ml Soln (Insulin glargine) .... 30 units two times a day for diabetes disp: qs 1 month 11)  Metformin Hcl 1000 Mg Tabs (Metformin hcl) .Marland Kitchen.. 1 tablet by mouth bid 12)  Neurontin 800 Mg Tabs (Gabapentin) .Marland KitchenMarland KitchenMarland Kitchen  1 tablet by mouth three times a day 13)  Flonase 50 Mcg/act Susp (Fluticasone propionate) .... 2 sprays each nostril daily 14)  Novolog Flexpen 100 Unit/ml Soln (Insulin aspart) .... Inject 12 units prior to lunch.   dispense one month supply. 15)  Prodigy Blood Glucose Monitor Devi (Blood glucose monitoring suppl) .... Please supply any glucose test device available for patient.  patient lost device. 16)  Prodigy No Coding Blood Gluc Strp (Glucose blood) .... Please supply quantity sufficient for 3 times daily blood glucose testing.    please match to blood glucose testing device available. 17)  Bd Pen Needle Short U/f 31g X 8 Mm Misc (Insulin pen needle) .... Supply quantity sufficient for one month supply of 3 times daily injections.  Other Orders: Pap Smear-[facility] [id number]) [place] - Est  40-64 yrs [id number])  Hypertension Assessment/Plan:      The patient's hypertensive risk group is category C: Target organ damage and/or diabetes.  Today's blood pressure is 148/70.  Her blood pressure goal is < 130/80.  Patient Instructions: 1)  It was great meeting you today! 2)  I will let you know the results of your pap smear. If it is normal, we can probably stop doing them. 3)  PLEASE go get a mammogram! It's very important. 4)  We are also drawing some blood to run some tests to check how your sugars have been, your thyroid, and your kidneys.  I will let you know what they show. 5)  Please come back in FASTING (nothing to eat or drink) to have your choleserol checked before the next time you see me. 6)  Come see me again in 3 months so that we can see how your blood pressure and diabetes are doing.   Orders Added: 1)  Pap Smear-[facility] [id number] 2)  [facility] - Est  40-64 yrs [id number]    Laboratory Results   Blood Tests   Date/Time Received: June 28, 2010 3:15 PM  Date/Time Reported: June 28, 2010 3:52 PM   HGBA1C: 13.1%   (Normal Range: Non-Diabetic - 3-6%   Control Diabetic - 6-8%)  Comments: ...........test performed by...........Marland KitchenHedy Camara, CMA

## 2010-09-18 NOTE — Assessment & Plan Note (Signed)
Summary: F/U DM, HTN and Patient Summary   Vital Signs:  Patient profile:   65 year old female Height:      63 inches Weight:      154.8 pounds BMI:     27.52 Temp:     98.1 degrees F Pulse rate:   82 / minute BP sitting:   143 / 75  Vitals Entered By: Elige Radon RN (January 23, 2010 8:35 AM)  Primary Care Provider:  Orland Mustard  MD  CC:  f/u chronic health problems.  History of Present Illness: [name] comes in for routine follow-up today.  Office visit is extensively documented to serve as patient summary for next primary provider. 1) DM - has been seeing Dr. Valentina Lucks in pharmacy clinic.  F/u with him now only as deemed necessary by MD.  Insulin regimen adjusted.  No hypoglycemic events.  Patient is doing better with med mgmt but still having dietary indiscretion.  Eats a "whole jar of peanut butter" sometimes.  Admits to poorer diet recently due to multiple deaths in her family and that is how she has been dealing with the stress.  Only checking her sugars a few times a week because "it hurts".  Brought meter.  Very infrequent testing.  30d avg 223 and 14 d 154.  Both based on relatively few numbers.  Range is 82-346.  Last A1C 13.5. On metformin, novolog 10 with lunch, and lantus 30 two times a day.  States she went to ophthamology last week and was told no change in her retinopathy.  We do not have correspondence from the visit.  2) HTN - hasn't taken meds yet today.  On max HCTZ and accupril. No HA, CP, SOB 3) Obesity - weight is down.  Reports doing zumba, tai chi, and yoga at the senior center regularly now.  Weight is lowest it has been in over 3 years with improved (though still not optimal) glucose control.  4) Peripheral neuropathy - burning in her feet iimproved with neurontin but still giving her trouble.  On neurontin 800 once daily and tolerating well.   Habits & Providers  Alcohol-Tobacco-Diet     Alcohol drinks/day: 0     Tobacco Status: never     Diet Comments:  diabetic     Diet Counseling: to improve diet; diet is suboptimal  Exercise-Depression-Behavior     Does Patient Exercise: yes     Exercise Counseling: to improve exercise regimen     Type of exercise: walk     Exercise (avg: min/session): 30-60     Times/week: 4     Have you felt down or hopeless? no     Have you felt little pleasure in things? no     Depression Counseling: not indicated; screening negative for depression     Drug Use: never     Seat Belt Use: always     Sun Exposure: infrequent     Sun Exposure Counseling: yes  Allergies: 1)  Avandia (Rosiglitazone Maleate)  Social History: Therapist, art Use:  always Sun Exposure-Excessive:  infrequent Does Patient Exercise:  yes Drug Use:  never  Physical Exam  General:  overweight, alert, NAD vitals reviewed Eyes:  conjunctiva clear, no injection fundoscopic exam reveal areas of hyperpigmented retinal scarring normal optic disc Lungs:  Normal respiratory effort, chest expands symmetrically. Lungs are clear to auscultation, no crackles or wheezes. Heart:  Normal rate and regular rhythm. S1 and S2 normal without gallop, murmur, click, rub or other  extra sounds. Pulses:  2+ radial and dp pulses Extremities:  no edema   Impression & Recommendations:  Problem # 1:  DIABETES MELLITUS, II, COMPLICATIONS (A999333) Assessment Improved A1C improved but still far from goal.  Unfortuantely, not checking sugars frequently like asked so that insulin can be better dosed.  Discussed ways to minimize finger  pain from checking sugars.  SHe will try to resume checking sugars three times a day and will record them.  Will increase lunch novolog to 12 units as suggested by Dr. Valentina Lucks in his last note.  SHe will follow-up in 1 month with record of sugars for further adjustment of insulin.   Her updated medication list for this problem includes:    Bayer Aspirin 325 Mg Tabs (Aspirin) .Marland Kitchen... 1 tablet daily    Accupril 40 Mg Tabs (Quinapril  hcl) .Marland Kitchen... 1 tablet by mouth daily    Lantus Solostar 100 Unit/ml Soln (Insulin glargine) .Marland KitchenMarland KitchenMarland KitchenMarland Kitchen 30 units two times a day for diabetes disp: qs 1 month    Metformin Hcl 1000 Mg Tabs (Metformin hcl) .Marland Kitchen... 1 tablet by mouth bid    Novolog Flexpen 100 Unit/ml Soln (Insulin aspart) ..... Inject 12 units prior to lunch.   dispense one month supply.  Orders: A1C-[facility] [id number]) [place]- Est  Level 4 [id number])  Problem # 2:  DIABETIC  RETINOPATHY (ICD-250.50) Assessment: Unchanged Stable per patient.  Sees [place] ophthamology.  Has had laser treatment.  Her updated medication list for this problem includes:    Bayer Aspirin 325 Mg Tabs (Aspirin) .Marland Kitchen... 1 tablet daily    Accupril 40 Mg Tabs (Quinapril hcl) .Marland Kitchen... 1 tablet by mouth daily    Lantus Solostar 100 Unit/ml Soln (Insulin glargine) .Marland KitchenMarland KitchenMarland KitchenMarland Kitchen 30 units two times a day for diabetes disp: qs 1 month    Metformin Hcl 1000 Mg Tabs (Metformin hcl) .Marland Kitchen... 1 tablet by mouth bid    Novolog Flexpen 100 Unit/ml Soln (Insulin aspart) ..... Inject 12 units prior to lunch.   dispense one month supply.  Problem # 3:  DIABETIC PERIPHERAL NEUROPATHY (ICD-250.60) Assessment: Deteriorated  Increase neurontin to two times a day.  Her updated medication list for this problem includes:    Bayer Aspirin 325 Mg Tabs (Aspirin) .Marland Kitchen... 1 tablet daily    Accupril 40 Mg Tabs (Quinapril hcl) .Marland Kitchen... 1 tablet by mouth daily    Lantus Solostar 100 Unit/ml Soln (Insulin glargine) .Marland KitchenMarland KitchenMarland KitchenMarland Kitchen 30 units two times a day for diabetes disp: qs 1 month    Metformin Hcl 1000 Mg Tabs (Metformin hcl) .Marland Kitchen... 1 tablet by mouth bid    Novolog Flexpen 100 Unit/ml Soln (Insulin aspart) ..... Inject 12 units prior to lunch.   dispense one month supply.  Orders: [place]- Est  Level 4 [id number])  Problem # 4:  HYPERTENSION, BENIGN SYSTEMIC (ICD-401.1) Assessment: Deteriorated  Not at goal but  has not taken meds today.  REcheck at next visit.  Advised to take meds before appointment.  Her updated medication  list for this problem includes:    Hydrochlorothiazide 25 Mg Tabs (Hydrochlorothiazide) .Marland Kitchen... 1 tablet by mouth daily    Accupril 40 Mg Tabs (Quinapril hcl) .Marland Kitchen... 1 tablet by mouth daily  Orders: [place]- Est  Level 4 ([id number])  Problem # 5:  OTHER POSTABLATIVE HYPOTHYROIDISM (ICD-244.1) Needs TSH checked at next visit.  Her updated medication list for this problem includes:    Synthroid 200 Mcg Tabs (Levothyroxine sodium) .Marland Kitchen... 1 tablet by mouth daily  Problem # 6:  Preventive Health Care (ICD-V70.0)  Assessment: Comment Only Pap - last in 12/06.  Normal.  denies history of abnormal pap.  No sexual intercourse in over 12 years, per patient.  Low risk.  Patient could likely stop having paps as long as she did not resume sexual activity, however patient is worried about cancer and so would like periodic paps.  Certainly does not need more often than q14yrs unless risk factors change.  SHe will return in 1 month for physical and pap.  Colonoscopy - due for this.  SHe is worried about colon cancer.  Has [facility] assistance.  Will put in referral to see if any volunteer GIs available Mammmo - last in 09/2008. N ormal.  Does have breast cancer in the family.  Given number of [facility] to call and schedule. Lipids - no h/o HLD but have not been checked in 2 years.  Advised to come fasting to her physical exam so labwork can be obtained.   Problem # 7:  OBESITY, NOS (ICD-278.00) Assessment: Improved Down 7.5 pounds.  Congratulated on this.  Encouraged continuing to exercise and watch her diet further.  Orders: [place]- Est  Level 4 [id number])  Complete Medication List: 1)  Methotrexate 2.5 Mg Tabs (Methotrexate sodium) .... 5 tablets by mouth every monday and tuesday 2)  Bayer Aspirin 325 Mg Tabs (Aspirin) .Marland Kitchen.. 1 tablet daily 3)  Folic Acid Q000111Q Mcg Tabs (Folic acid) .Marland Kitchen.. 1 tablet by mouth daily 4)  Hydrochlorothiazide 25 Mg Tabs (Hydrochlorothiazide) .Marland Kitchen.. 1 tablet by mouth daily 5)   Synthroid 200 Mcg Tabs (Levothyroxine sodium) .Marland Kitchen.. 1 tablet by mouth daily 6)  Accupril 40 Mg Tabs (Quinapril hcl) .Marland Kitchen.. 1 tablet by mouth daily 7)  Multivitamins Tabs (Multiple vitamin) .Marland Kitchen.. 1 tablet by mouth daily 8)  Nexium 40 Mg Pack (Esomeprazole magnesium) .Marland Kitchen.. 1 tablet by mouth daily 9)  Calcium 600 Mg Tabs (Calcium) .Marland Kitchen.. 1 tablet by mouth daily 10)  Lantus Solostar 100 Unit/ml Soln (Insulin glargine) .... 30 units two times a day for diabetes disp: qs 1 month 11)  Metformin Hcl 1000 Mg Tabs (Metformin hcl) .Marland Kitchen.. 1 tablet by mouth bid 12)  Neurontin 800 Mg Tabs (Gabapentin) .Marland Kitchen.. 1 tablet by mouth two times a day 13)  Flonase 50 Mcg/act Susp (Fluticasone propionate) .... 2 sprays each nostril daily 14)  Novolog Flexpen 100 Unit/ml Soln (Insulin aspart) .... Inject 12 units prior to lunch.   dispense one month supply. 15)  Prodigy Blood Glucose Monitor Devi (Blood glucose monitoring suppl) .... Please supply any glucose test device available for patient.  patient lost device. 16)  Prodigy No Coding Blood Gluc Strp (Glucose blood) .... Please supply quantity sufficient for 3 times daily blood glucose testing.   please match to blood glucose testing device available. 17)  Bd Pen Needle Short U/f 31g X 8 Mm Misc (Insulin pen needle) .... Supply quantity sufficient for one month supply of 3 times daily injections.  Patient Instructions: 1)  Increase your novolog insulin (the insulin for lunch time) to 12 units.  Please check your sugars three times a day and record them and bring them in next time. 2)  To help your feet, take a neurontin at night and in the morning. 3)  Please call to schedule your mammogram at your earliest convenience. 4)  We will try to get you on the list for the colonoscopy.  5)  Please return in another 4 weeks for a complete physical and pap smear.  6)  Prescriptions: NOVOLOG FLEXPEN 100 UNIT/ML SOLN (INSULIN ASPART) Inject 12 units prior to lunch.   Dispense one month  supply.  #1 x 3   Entered and Authorized by:   Orland Mustard  MD   Signed by:   Orland Mustard  MD on 01/23/2010   Method used:   Print then Give to Patient   RxID:   [id number] NEURONTIN 800 MG TABS (GABAPENTIN) 1 tablet by mouth two times a day  #60 x 3   Entered and Authorized by:   Orland Mustard  MD   Signed by:   Orland Mustard  MD on 01/23/2010   Method used:   Print then Give to Patient   RxID:   [id number]   Laboratory Results   Blood Tests   Date/Time Received: January 23, 2010 8:32 AM  Date/Time Reported: January 23, 2010 8:47 AM   HGBA1C: 10.5%   (Normal Range: Non-Diabetic - 3-6%   Control Diabetic - 6-8%)  Comments: ...............test performed by......Marland KitchenBonnie A. Martinique, MLS (ASCP)cm

## 2010-09-18 NOTE — Miscellaneous (Signed)
  Clinical Lists Changes  Problems: Removed problem of ENCOUNTER FOR LONG-TERM USE OF OTHER MEDICATIONS (ICD-V58.69) Removed problem of GASTROENTERITIS WITHOUT DEHYDRATION (ICD-558.9) Removed problem of BACK PAIN, RIGHT (ICD-724.5)

## 2010-09-18 NOTE — Letter (Signed)
Summary: Generic Letter  Rafael Capo, [place]    Phone:   Fax:     07/03/2010  [facility] Gambone [address] [place], [place]  [address]  Dear Ms. REBELLO,    I am please to inform you that your results of your pap smear are normal.  However, we do need you to call the office to discuss your thyroid.  According to the lab tests, it appears that we need to increase your synthroid from 261mcg to 228mcg.  Please call and let us know where you would like the additional medication called into.  Or, if it is easier for you, you can come by and pick up a prescription.  Thanks!     Sincerely,   Lorin Glass MD  Appended Document: Generic Letter mailed

## 2010-09-18 NOTE — Assessment & Plan Note (Signed)
Summary: Diabetes - Rx Clinic   Vital Signs:  Patient profile:   65 year old female Weight:      158.8 pounds Pulse rate:   89 / minute BP sitting:   127 / 78  (left arm)  Primary Care Provider:  Orland Mustard  MD   History of Present Illness: Patient arrives in good spirits.  States she "just can't stay way from these"  AND she showed [place] a 60 piece bag of candy.  She reports eating as much as 20 pieces per day. She was ashamed of eating this much candy.   We agreed to limit this use.     She reports that she did well with injecting the pens!  Reported adherence with all doses.    Home CBG monitoring:   one low of 82 (fasting)   Typically daily CBG - 7 day average was <150.  she had three readings >150  (191, 242, and 319)  Meter had clock set incorrectly.   Clock was reset.         Current Medications (verified): 1)  Methotrexate 2.5 Mg  Tabs (Methotrexate Sodium) .... 5 Tablets By Mouth Every Monday and Tuesday 2)  Bayer Aspirin 325 Mg Tabs (Aspirin) .Marland Kitchen.. 1 Tablet Daily 3)  Folic Acid Q000111Q Mcg  Tabs (Folic Acid) .Marland Kitchen.. 1 Tablet By Mouth Daily 4)  Hydrochlorothiazide 25 Mg  Tabs (Hydrochlorothiazide) .Marland Kitchen.. 1 Tablet By Mouth Daily 5)  Synthroid 200 Mcg Tabs (Levothyroxine Sodium) .Marland Kitchen.. 1 Tablet By Mouth Daily 6)  Accupril 40 Mg  Tabs (Quinapril Hcl) .Marland Kitchen.. 1 Tablet By Mouth Daily 7)  Multivitamins   Tabs (Multiple Vitamin) .Marland Kitchen.. 1 Tablet By Mouth Daily 8)  Nexium 40 Mg  Pack (Esomeprazole Magnesium) .Marland Kitchen.. 1 Tablet By Mouth Daily 9)  Calcium 600 Mg  Tabs (Calcium) .Marland Kitchen.. 1 Tablet By Mouth Daily 10)  Lantus Solostar 100 Unit/ml Soln (Insulin Glargine) .... 30 Units Two Times A Day For Diabetes Disp: Qs 1 Month 11)  Metformin Hcl 1000 Mg  Tabs (Metformin Hcl) .Marland Kitchen.. 1 Tablet By Mouth Bid 12)  Neurontin 800 Mg Tabs (Gabapentin) .Marland Kitchen.. 1 Tablet By Mouth Daily 13)  Flonase 50 Mcg/act Susp (Fluticasone Propionate) .... 2 Sprays Each Nostril Daily 14)  Novolog Flexpen 100 Unit/ml Soln (Insulin  Aspart) .... Inject 10 Units Prior To Lunch.   Dispense One Month Supply. 15)  Prodigy Blood Glucose Monitor  Devi (Blood Glucose Monitoring Suppl) .... Please Supply Any Glucose Test Device Available For Patient.  Patient Lost Device. 16)  Prodigy No Coding Blood Gluc  Strp (Glucose Blood) .... Please Supply Quantity Sufficient For 3 Times Daily Blood Glucose Testing.   Please Match To Blood Glucose Testing Device Available.  Allergies (verified): 1)  Avandia (Rosiglitazone Maleate)   Impression & Recommendations:  Problem # 1:  DIABETES MELLITUS, II, COMPLICATIONS (A999333) Assessment Improved  Diabetes of: many yrs duration currently under: improved control of blood glucose based on home CBG random readings of: < 150 (We reset her blood glucometer clock today).   Control is suboptimal due to:   eating candy (Patient will try to decrease use from 20 pieces to 3 or less per day.   Denies hypoglycemic events.  Able to verbalize appropriate hypoglycemia management plan.   Continued basal insulin Lantus 30 units twice daily AND Novolog 10 units prior to lunch.  Samples of pens provided.  New Rxs for pens was [facility] to [facility].   Written pt instructions provided:   F/U  Rx Clinic Visit:1 month.    TTFFC: 25  mins.  Pt seen with: Vanessa Geyser, PharmD candidate and Gwynneth Albright, PharmD resident  Her updated medication list for this problem includes:    Bayer Aspirin 325 Mg Tabs (Aspirin) .Marland Kitchen... 1 tablet daily    Accupril 40 Mg Tabs (Quinapril hcl) .Marland Kitchen... 1 tablet by mouth daily    Lantus Solostar 100 Unit/ml Soln (Insulin glargine) .Marland KitchenMarland KitchenMarland KitchenMarland Kitchen 30 units two times a day for diabetes disp: qs 1 month    Metformin Hcl 1000 Mg Tabs (Metformin hcl) .Marland Kitchen... 1 tablet by mouth bid    Novolog Flexpen 100 Unit/ml Soln (Insulin aspart) ..... Inject 10 units prior to lunch.   dispense one month supply.  [id number]  Complete Medication List: 1)  Methotrexate 2.5 Mg Tabs  (Methotrexate sodium) .... 5 tablets by mouth every monday and tuesday 2)  Bayer Aspirin 325 Mg Tabs (Aspirin) .Marland Kitchen.. 1 tablet daily 3)  Folic Acid Q000111Q Mcg Tabs (Folic acid) .Marland Kitchen.. 1 tablet by mouth daily 4)  Hydrochlorothiazide 25 Mg Tabs (Hydrochlorothiazide) .Marland Kitchen.. 1 tablet by mouth daily 5)  Synthroid 200 Mcg Tabs (Levothyroxine sodium) .Marland Kitchen.. 1 tablet by mouth daily 6)  Accupril 40 Mg Tabs (Quinapril hcl) .Marland Kitchen.. 1 tablet by mouth daily 7)  Multivitamins Tabs (Multiple vitamin) .Marland Kitchen.. 1 tablet by mouth daily 8)  Nexium 40 Mg Pack (Esomeprazole magnesium) .Marland Kitchen.. 1 tablet by mouth daily 9)  Calcium 600 Mg Tabs (Calcium) .Marland Kitchen.. 1 tablet by mouth daily 10)  Lantus Solostar 100 Unit/ml Soln (Insulin glargine) .... 30 units two times a day for diabetes disp: qs 1 month 11)  Metformin Hcl 1000 Mg Tabs (Metformin hcl) .Marland Kitchen.. 1 tablet by mouth bid 12)  Neurontin 800 Mg Tabs (Gabapentin) .Marland Kitchen.. 1 tablet by mouth daily 13)  Flonase 50 Mcg/act Susp (Fluticasone propionate) .... 2 sprays each nostril daily 14)  Novolog Flexpen 100 Unit/ml Soln (Insulin aspart) .... Inject 10 units prior to lunch.   dispense one month supply. 15)  Prodigy Blood Glucose Monitor Devi (Blood glucose monitoring suppl) .... Please supply any glucose test device available for patient.  patient lost device. 16)  Prodigy No Coding Blood Gluc Strp (Glucose blood) .... Please supply quantity sufficient for 3 times daily blood glucose testing.   please match to blood glucose testing device available. 17)  Bd Pen Needle Short U/f 31g X 8 Mm Misc (Insulin pen needle) .... Supply quantity sufficient for one month supply of 3 times daily injections.  Patient Instructions: 1)  Limit candy to 3 pieces or LESS per day.  Please try to eat more fresh fruit.  2)  Continue same dose lantus 30 units two times a day.  3)  Continue same dose Novolog 10 units prior to lunch.  4)  Keep taking your insulin.  Prescriptions: BD PEN NEEDLE SHORT U/F 31G X 8 MM MISC  (INSULIN PEN NEEDLE) Supply quantity sufficient for one month supply of 3 times daily injections.  #1 x 11   Entered by:   Rinaldo Ratel D   Authorized by:   Orland Mustard  MD   Signed by:   Janeann Forehand Pharm D on 10/13/2009   Method used:   Faxed to ...       [facility] (retail)       [address] [place]       [place], [place]  [address]       Ph: [phone]  Fax: [phone]   [id number] NOVOLOG FLEXPEN 100 UNIT/ML SOLN (INSULIN ASPART) Inject 10 units prior to lunch.   Dispense one month supply.  #1 x 11   Entered by:   Rinaldo Ratel D   Authorized by:   Orland Mustard  MD   Signed by:   Janeann Forehand Pharm D on 10/13/2009   Method used:   Faxed to ...       [facility] (retail)       [address] [place]       [place], [place]  [address]       Ph: [phone]       Fax: [phone]   RxID:   [id number] LANTUS SOLOSTAR 100 UNIT/ML SOLN (INSULIN GLARGINE) 30 units two times a day for diabetes disp: QS 1 month  #1 x 11   Entered by:   Rinaldo Ratel D   Authorized by:   Orland Mustard  MD   Signed by:   Janeann Forehand Pharm D on 10/13/2009   Method used:   Faxed to ...       [facility] (retail)       [address] [place]       [place], [place]  [address]       Ph: [phone]       Fax: [phone]   RxID:   [id number]   Prevention & Chronic Care Immunizations   Influenza vaccine: Fluvax Non-MCR  (05/13/2008)   Influenza vaccine due: 07/09/2008    Tetanus booster: 10/22/2004: Done.   Tetanus booster due: 10/23/2014    Pneumococcal vaccine: Done.  (07/19/1994)   Pneumococcal vaccine due: None    H. zoster vaccine: Not documented  Colorectal Screening   Hemoccult: Done.  (12/22/2004)   Hemoccult due: Not Indicated    Colonoscopy: Done.  (12/22/2004)   Colonoscopy due: 12/23/2014  Other Screening   Pap smear: Done.  (07/24/2005)   Pap  smear due: 07/24/2006    Mammogram: normal  (09/27/2008)   Mammogram due: 09/27/2009    DXA bone density scan: abnormal  (04/06/2007)   DXA scan due: 04/2009    Smoking status: never  (09/01/2009)  Diabetes Mellitus   HgbA1C: 13.7  (09/01/2009)   HgbA1C action/deferral: Not indicated  (04/20/2009)   Hemoglobin A1C due: 10/25/2007    Eye exam: Not documented    Foot exam: yes  (11/04/2008)   High risk foot: Not documented   Foot care education: Not documented   Foot exam due: 03/16/2008    Urine microalbumin/creatinine ratio: Not documented    Diabetes flowsheet reviewed?: Yes   Progress toward A1C goal: Improved    Stage of readiness to change (diabetes management): Action  Lipids   Total Cholesterol: Not documented   LDL: 89  (02/17/2008)   LDL Direct: 89  (02/17/2008)   HDL: Not documented   Triglycerides: Not documented  Hypertension   Last Blood Pressure: 127 / 78  (10/13/2009)   Serum creatinine: 0.84  (05/11/2008)   Serum potassium 4.2  (05/11/2008)    Hypertension flowsheet reviewed?: Yes   Progress toward BP goal: At goal  Self-Management Support :   Personal Goals (by the next clinic visit) :     Personal A1C goal: 9  (04/20/2009)     Personal blood pressure goal: 130/80  (04/20/2009)     Personal LDL goal: 100  (04/20/2009)    Diabetes self-management support: Copy of home glucose  meter record, Written self-care plan, Pre-printed educational material  (09/29/2009)    Hypertension self-management support: Not documented

## 2010-09-20 NOTE — Miscellaneous (Addendum)
Summary: refill request  Clinical Lists Changes recieved refill request from [facility]  for neurontin. will forward to MD .  Marcell Barlow RN  September 14, 2010 1:47 PM   I will give one month supply. If pt wants more so will need to come in for an appt. Lorin Glass MD  September 14, 2010 6:16 PM  Medications: Rx of NEURONTIN 800 MG TABS (GABAPENTIN) 1 tablet by mouth three times a day;  #90 x 0;  Signed;  Entered by: Lorin Glass MD;  Authorized by: Lorin Glass MD;  Method used: Faxed to [facility], [place], [place], [place]  [address], Ph: [phone], Fax: [phone]    Prescriptions: NEURONTIN 800 MG TABS (GABAPENTIN) 1 tablet by mouth three times a day  #90 x 0   Entered and Authorized by:   Lorin Glass MD   Signed by:   Lorin Glass MD on 09/14/2010   Method used:   Faxed to ...       [facility] (retail)       [place], [place]  [address]       Ph: [phone]       Fax: [phone]   RxID:   [id number]   Appended Document: refill request    Prescriptions: NEURONTIN 800 MG TABS (GABAPENTIN) 1 tablet by mouth three times a day  #90 x 3   Entered and Authorized by:   Lorin Glass MD   Signed by:   Lorin Glass MD on 09/17/2010   Method used:   Printed then faxed to ...       [facility] (retail)       [address] [place], [place]  [address]       Ph: [phone]       Fax: [phone]   RxID:   [id number]

## 2010-09-26 NOTE — Progress Notes (Signed)
----   Converted from flag ---- ---- 09/17/2010 4:05 PM, Lorin Glass MD wrote: Absolutely  ---- 09/17/2010 9:33 AM, Marcell Barlow RN wrote: Will it be ok to give  3 refills on the neurontin.  [facility] to [facility] but it has to be for 3 months supply.  Thanks , Jeani Hawking ------------------------------  called and left message with [facility] that rx for Neurontin can be for 3 month supply.  called patient and reminded her to schedule appointment for her 3 month check up in Feb.  Marcell Barlow RN  September 18, 2010 9:03 AM

## 2010-10-09 ENCOUNTER — Ambulatory Visit (HOSPITAL_COMMUNITY)
Admission: RE | Admit: 2010-10-09 | Discharge: 2010-10-09 | Disposition: A | Discharge status: Home / Self Care | Payer: Self-pay | Source: Ambulatory Visit | Attending: Family Medicine | Admitting: Family Medicine

## 2010-10-09 DIAGNOSIS — Z1231 Encounter for screening mammogram for malignant neoplasm of breast: Secondary | ICD-10-CM

## 2010-10-09 DIAGNOSIS — Z139 Encounter for screening, unspecified: Secondary | ICD-10-CM | POA: Insufficient documentation

## 2010-10-16 ENCOUNTER — Ambulatory Visit (INDEPENDENT_AMBULATORY_CARE_PROVIDER_SITE_OTHER): Payer: Self-pay | Admitting: Family Medicine

## 2010-10-16 ENCOUNTER — Encounter: Payer: Self-pay | Admitting: Family Medicine

## 2010-10-16 DIAGNOSIS — E1139 Type 2 diabetes mellitus with other diabetic ophthalmic complication: Secondary | ICD-10-CM

## 2010-10-16 DIAGNOSIS — E119 Type 2 diabetes mellitus without complications: Secondary | ICD-10-CM

## 2010-10-16 DIAGNOSIS — E890 Postprocedural hypothyroidism: Secondary | ICD-10-CM

## 2010-10-16 DIAGNOSIS — E1149 Type 2 diabetes mellitus with other diabetic neurological complication: Secondary | ICD-10-CM

## 2010-10-16 DIAGNOSIS — I10 Essential (primary) hypertension: Secondary | ICD-10-CM

## 2010-10-16 DIAGNOSIS — E669 Obesity, unspecified: Secondary | ICD-10-CM

## 2010-10-16 LAB — BASIC METABOLIC PANEL
Calcium: 9.2 mg/dL (ref 8.4–10.5)
Creat: 0.81 mg/dL (ref 0.40–1.20)
Glucose, Bld: 390 mg/dL — ABNORMAL HIGH (ref 70–99)
Sodium: 135 mEq/L (ref 135–145)

## 2010-10-16 LAB — CONVERTED CEMR LAB
CO2: 23 meq/L (ref 19–32)
Calcium: 9.2 mg/dL (ref 8.4–10.5)
Chloride: 100 meq/L (ref 96–112)
Creatinine, Ser: 0.81 mg/dL (ref 0.40–1.20)
Glucose, Bld: 390 mg/dL — ABNORMAL HIGH (ref 70–99)
Sodium: 135 meq/L (ref 135–145)

## 2010-10-16 NOTE — Assessment & Plan Note (Signed)
R>L foot, pt does not feel like she is tripping or having falls.  No sores, encouraged nightly foot checks. Will continue neurontin 800mg  TID.

## 2010-10-16 NOTE — Patient Instructions (Signed)
Please try to take your medicines every day!  It's very important to take care of yourself! We are drawing some labs today, I will let you know the results of them. Please come back in 3 months to see me again.

## 2010-10-16 NOTE — Assessment & Plan Note (Signed)
Will check TSH today (synthroid was increased in 11/11 from 200 to 225). No s/s of hypo or hyperthyroid.

## 2010-10-16 NOTE — Assessment & Plan Note (Signed)
A1c 12.9 today (13.1 11/11). Pt not taking medicines every day and is not following diabetic diet.  Stressed the importance of both of these factors. Pt states she will try to at least take meds daily.  Meter stopped working this morning, but she has a spare.  Told pt to call if she needs different strips for the other meter.  Also encouraged pt to continue checking CBGs and write down results (per pt, have been 200s-300s).  Cont metformin 1000 BID, novolog 12, lantus 30 BID, neurontin, quinapril No med changes at this time 2/2 noncompliance.  Will likely need to increase lantus once pt is taking meds regularly. Recheck A1c in 3 months.

## 2010-10-16 NOTE — Assessment & Plan Note (Signed)
BP elevated today- 154/76 --> 143/68 on recheck. No red flags. No CP, no papiledema. Pt noncompliant with medications.  Will check BMET today for kidney fxn. No med changes until pt compliant.

## 2010-10-16 NOTE — Progress Notes (Signed)
Subjective:    Patient ID: [name], female    DOB: 1945/11/30, 65 y.o.   MRN: [id number]  Pt comes in today for f/u.  Issues discussed:  1. Stress: Pt has a lot of stressors in her life at this time.  Her daughter, who is usually "so strong" has been diagnosed with some sort of cancer (?MM vs just myositis) and is having troubles with just walking.  Pt feels that she should be doing more to help, but can barely keep her own house clean b/c of other obligations.  In addition, grandson got into fight and had his teeth knocked out.  Just has many problems all at the same time.  2. Pains: Per pt, she has fibromyalgia and has moving pains.  Usually move every day or so, often in her joints such as shoulder, knee, and elbow, but yesterday evening, started having pain in her abdomen.  "band like" around her ribs. No N/V/D/C. No chest pain, no dizziness or sweating. Is more in the ribs than her actual stomach.  3. Health maintenance: Pt not taking meds every day b/c she often forgets and is busy and doesn't always have them with her.  Misses 3x/week.  Per pt, CBGs have been 200s-300s since January. No hypo or hyperglycemic symptoms.   Diabetes She presents for her follow-up diabetic visit. She has type 2 diabetes mellitus. No MedicAlert identification noted. Pertinent negatives for hypoglycemia include no confusion, dizziness, headaches or sweats. Associated symptoms include foot paresthesias. Pertinent negatives for diabetes include no blurred vision, no chest pain, no fatigue, no foot ulcerations, no polydipsia, no polyphagia, no polyuria, no visual change and no weakness. There are no hypoglycemic complications. Symptoms are stable. Diabetic complications include peripheral neuropathy and retinopathy. Pertinent negatives for diabetic complications include no CVA. Risk factors for coronary artery disease include diabetes mellitus, family history, hypertension, obesity, post-menopausal and stress.  Current diabetic treatment includes insulin injections and oral agent (monotherapy). She is compliant with treatment some of the time. She is following a generally unhealthy diet. An ACE inhibitor/angiotensin II receptor blocker is being taken. She does not see a podiatrist.Eye exam is current.  Hypertension This is a chronic problem. Associated symptoms include anxiety. Pertinent negatives include no blurred vision, chest pain, headaches, orthopnea, palpitations, peripheral edema, shortness of breath or sweats. Risk factors for coronary artery disease include diabetes mellitus, family history, obesity, post-menopausal state and stress. Past treatments include ACE inhibitors and diuretics. The current treatment provides moderate improvement. Compliance problems include psychosocial issues.  Hypertensive end-organ damage includes retinopathy. There is no history of angina, kidney disease, CAD/MI, CVA, heart failure or left ventricular hypertrophy.      Review of Systems  Constitutional: Negative for fever, diaphoresis, activity change, appetite change and fatigue.  HENT: Negative for congestion, sore throat, rhinorrhea, sneezing, trouble swallowing, voice change and postnasal drip.   Eyes: Negative for blurred vision, photophobia and visual disturbance.  Respiratory: Positive for cough. Negative for shortness of breath and wheezing.   Cardiovascular: Positive for leg swelling. Negative for chest pain, palpitations and orthopnea.  Gastrointestinal: Positive for abdominal pain. Negative for nausea, vomiting, diarrhea, constipation and abdominal distention.  Genitourinary: Negative for polyuria.  Musculoskeletal: Positive for myalgias, back pain and arthralgias.  Skin: Negative for rash.  Neurological: Negative for dizziness, facial asymmetry, weakness and headaches.  Hematological: Negative for polydipsia and polyphagia.  Psychiatric/Behavioral: Negative for confusion.       Objective:    Physical Exam  Constitutional: She appears  well-developed and well-nourished. No distress.  HENT:  Head: Normocephalic and atraumatic.  Mouth/Throat: Oropharynx is clear and moist.  Eyes: Conjunctivae are normal. Pupils are equal, round, and reactive to light.  Fundoscopic exam:      The right eye shows no papilledema.       The left eye shows no papilledema.  Neck: Normal range of motion. Neck supple. No thyromegaly present.  Cardiovascular: Normal rate and regular rhythm.   No murmur heard. Pulmonary/Chest: Effort normal and breath sounds normal. No respiratory distress. She has no wheezes.  Abdominal: Soft. Bowel sounds are normal. She exhibits no distension. There is no tenderness.  Musculoskeletal: Normal range of motion.       Arms: Lymphadenopathy:    She has no cervical adenopathy.  Skin: Skin is warm, dry and intact.       Skin intact w/o lesions or abrasions bilateral feet. Decreased sensation to sharp touch on right toes, normal on dorsal and plantar feet bilaterally.          Assessment & Plan:

## 2010-10-16 NOTE — Assessment & Plan Note (Signed)
Per pt, last saw optho in July 2011.  Encouraged her to make sure she is seen yearly.  No gross abnormalities seen on fundoscopic exam. No acute vision changes. Continue to monitor.

## 2010-11-13 ENCOUNTER — Other Ambulatory Visit: Payer: Self-pay | Admitting: *Deleted

## 2010-11-13 DIAGNOSIS — I10 Essential (primary) hypertension: Secondary | ICD-10-CM

## 2010-11-13 MED ORDER — QUINAPRIL HCL 40 MG PO TABS: 40.0000 mg | ORAL_TABLET | Freq: Every day | ORAL | Status: DC

## 2010-11-19 ENCOUNTER — Other Ambulatory Visit: Payer: Self-pay | Admitting: *Deleted

## 2010-11-19 DIAGNOSIS — I10 Essential (primary) hypertension: Secondary | ICD-10-CM

## 2010-11-19 MED ORDER — QUINAPRIL HCL 40 MG PO TABS: 40.0000 mg | ORAL_TABLET | Freq: Every day | ORAL | Status: DC

## 2011-02-05 ENCOUNTER — Other Ambulatory Visit: Payer: Self-pay | Admitting: Family Medicine

## 2011-02-05 DIAGNOSIS — E890 Postprocedural hypothyroidism: Secondary | ICD-10-CM

## 2011-02-05 DIAGNOSIS — E1139 Type 2 diabetes mellitus with other diabetic ophthalmic complication: Secondary | ICD-10-CM

## 2011-02-05 DIAGNOSIS — M069 Rheumatoid arthritis, unspecified: Secondary | ICD-10-CM

## 2011-02-05 DIAGNOSIS — E669 Obesity, unspecified: Secondary | ICD-10-CM

## 2011-02-05 DIAGNOSIS — M899 Disorder of bone, unspecified: Secondary | ICD-10-CM

## 2011-02-05 DIAGNOSIS — I10 Essential (primary) hypertension: Secondary | ICD-10-CM

## 2011-02-05 DIAGNOSIS — E1165 Type 2 diabetes mellitus with hyperglycemia: Secondary | ICD-10-CM

## 2011-02-05 DIAGNOSIS — K219 Gastro-esophageal reflux disease without esophagitis: Secondary | ICD-10-CM

## 2011-02-05 DIAGNOSIS — IMO0002 Reserved for concepts with insufficient information to code with codable children: Secondary | ICD-10-CM

## 2011-02-05 DIAGNOSIS — N3941 Urge incontinence: Secondary | ICD-10-CM

## 2011-02-05 MED ORDER — INSULIN GLARGINE 100 UNIT/ML ~~LOC~~ SOLN: SUBCUTANEOUS | Status: DC

## 2011-02-05 MED ORDER — LEVOTHYROXINE SODIUM 200 MCG PO TABS: 200.0000 ug | ORAL_TABLET | Freq: Every day | ORAL | Status: DC

## 2011-02-05 MED ORDER — INSULIN ASPART 100 UNIT/ML ~~LOC~~ SOLN: SUBCUTANEOUS | Status: DC

## 2011-02-05 MED ORDER — METFORMIN HCL 1000 MG PO TABS: 1000.0000 mg | ORAL_TABLET | Freq: Two times a day (BID) | ORAL | Status: DC

## 2011-02-05 MED ORDER — ESOMEPRAZOLE MAGNESIUM 40 MG PO PACK: 40.0000 mg | PACK | Freq: Every day | ORAL | Status: DC

## 2011-02-05 MED ORDER — INSULIN PEN NEEDLE 31G X 8 MM MISC: Status: DC

## 2011-02-05 MED ORDER — PRODIGY BLOOD GLUCOSE MONITOR DEVI: Status: DC

## 2011-02-05 MED ORDER — QUINAPRIL HCL 40 MG PO TABS: 40.0000 mg | ORAL_TABLET | Freq: Every day | ORAL | Status: DC

## 2011-02-05 MED ORDER — GLUCOSE BLOOD VI STRP: ORAL_STRIP | Status: DC

## 2011-02-05 MED ORDER — CALCIUM CARBONATE 600 MG PO TABS: ORAL_TABLET | ORAL | Status: DC

## 2011-02-05 MED ORDER — ASPIRIN 325 MG PO TABS: 325.0000 mg | ORAL_TABLET | Freq: Every day | ORAL | Status: DC

## 2011-02-05 MED ORDER — METHOTREXATE 2.5 MG PO TABS: ORAL_TABLET | ORAL | Status: DC

## 2011-02-05 MED ORDER — HYDROCHLOROTHIAZIDE 25 MG PO TABS: 25.0000 mg | ORAL_TABLET | Freq: Every day | ORAL | Status: DC

## 2011-02-05 MED ORDER — GABAPENTIN 800 MG PO TABS: 800.0000 mg | ORAL_TABLET | Freq: Three times a day (TID) | ORAL | Status: DC

## 2011-02-05 MED ORDER — FLUTICASONE PROPIONATE 50 MCG/ACT NA SUSP: 2.0000 | Freq: Every day | NASAL | Status: DC

## 2011-02-05 MED ORDER — LEVOTHYROXINE SODIUM 25 MCG PO TABS: ORAL_TABLET | ORAL | Status: DC

## 2011-02-05 MED ORDER — FOLIC ACID 800 MCG PO TABS: 800.0000 ug | ORAL_TABLET | Freq: Every day | ORAL | Status: DC

## 2011-02-05 NOTE — Telephone Encounter (Signed)
Refilled and sent   

## 2011-02-05 NOTE — Telephone Encounter (Signed)
Will fwd. To PCP to take care of. Javier Glazier, Gerrit Heck

## 2011-02-05 NOTE — Telephone Encounter (Signed)
Pt has an appt w/ [facility] on 7/13. She will be out of most of her meds before then and is also changing pharmacies since she now has medicare.  [facility] [address]  She has thrown out several of her bottles when she filled her meds box so she can't take it to the pharmacy.

## 2011-02-06 MED ORDER — GLUCOSE BLOOD VI STRP: ORAL_STRIP | Status: DC

## 2011-02-06 MED ORDER — PRODIGY BLOOD GLUCOSE MONITOR DEVI: Status: DC

## 2011-02-13 ENCOUNTER — Encounter: Payer: Self-pay | Admitting: Family Medicine

## 2011-02-13 DIAGNOSIS — H269 Unspecified cataract: Secondary | ICD-10-CM | POA: Insufficient documentation

## 2011-02-13 HISTORY — DX: Unspecified cataract: H26.9

## 2011-03-01 ENCOUNTER — Ambulatory Visit: Payer: Self-pay | Admitting: Family Medicine

## 2011-03-19 ENCOUNTER — Encounter: Payer: Self-pay | Admitting: Family Medicine

## 2011-03-19 ENCOUNTER — Ambulatory Visit (INDEPENDENT_AMBULATORY_CARE_PROVIDER_SITE_OTHER): Payer: Medicare Other | Admitting: Family Medicine

## 2011-03-19 VITALS — BP 140/79 | HR 84 | Temp 98.3°F | Ht 63.0 in | Wt 149.0 lb

## 2011-03-19 DIAGNOSIS — I10 Essential (primary) hypertension: Secondary | ICD-10-CM

## 2011-03-19 DIAGNOSIS — E890 Postprocedural hypothyroidism: Secondary | ICD-10-CM

## 2011-03-19 DIAGNOSIS — F4321 Adjustment disorder with depressed mood: Secondary | ICD-10-CM

## 2011-03-19 DIAGNOSIS — E118 Type 2 diabetes mellitus with unspecified complications: Secondary | ICD-10-CM

## 2011-03-19 DIAGNOSIS — E1149 Type 2 diabetes mellitus with other diabetic neurological complication: Secondary | ICD-10-CM

## 2011-03-19 DIAGNOSIS — IMO0002 Reserved for concepts with insufficient information to code with codable children: Secondary | ICD-10-CM

## 2011-03-19 DIAGNOSIS — E1165 Type 2 diabetes mellitus with hyperglycemia: Secondary | ICD-10-CM

## 2011-03-19 LAB — BASIC METABOLIC PANEL
Potassium: 3.9 mEq/L (ref 3.5–5.3)
Sodium: 138 mEq/L (ref 135–145)

## 2011-03-19 LAB — POCT GLYCOSYLATED HEMOGLOBIN (HGB A1C): Hemoglobin A1C: 11.9

## 2011-03-19 LAB — TSH: TSH: 10.154 u[IU]/mL — ABNORMAL HIGH (ref 0.350–4.500)

## 2011-03-19 NOTE — Patient Instructions (Addendum)
I'm so sorry to hear about your daughter.  If you feel like you need something to help with your mood, PLEASE let me know. Please consider calling either [facility] at [phone] or the [facility] at [phone] for someone to talk to/ grief counseling.  Please keep checking your sugars! The medicines that I would really like you to try to keep taking until you see me next are: -Hydrochlorothiazide 25mg  daily -Lantus 30 units twice per day -Synthroid 271mcg daily -Metformin 1000mg  twice per day -Quinapril 40mg  daily  Come back and see me in the next 3-6 weeks so we can see how you are doing.  If you need anything, PLEASE come back sooner.  If you have thoughts of wanting to hurt yourself or others please go to the closest emergency room for immediate assistance.

## 2011-03-21 ENCOUNTER — Encounter: Payer: Self-pay | Admitting: Family Medicine

## 2011-03-21 DIAGNOSIS — F4321 Adjustment disorder with depressed mood: Secondary | ICD-10-CM | POA: Insufficient documentation

## 2011-03-21 NOTE — Progress Notes (Signed)
S: Pt comes in today for follow up.  HYPERTENSION BP: 140/79 Meds: ASA, HCTZ, accupril Taking meds: Yes, frequently forgets    # of doses missed/week: 3 Symptoms: Headache: No Dizziness: No Vision changes: No SOB:  Yes, w/ talking occasionally (also having some congestion over the past few weeks since SOB has started) Chest pain: No LE swelling: No Tobacco use: No   DIABETES Home CBGs: Meds:Novolog, Lantus, metformin, neurontin Taking Meds: Yes, not taking lantus as Rx'ed- taking 50U qd not 30 BID; not taking novolog, sometimes misses metformin and lantus # of doses missed per week: 4 Hypoglycemic episodes?: no Symptoms: Polyuria: no Polydipsia: no Parasthesias: no   Dizziness: no   Last A1c: 11.9 (today)   GRIEF Daughter passed away 18-Feb-2011 Ferd Hibbs) at [facility] with likely multi-organ failure from uremia.  Pt's daughter had 4 children and their father/step-father has custody and it asking pt not to come around anymore.  This upsets pt greatly.  She has not been sleeping well and has been forgetting to take medicines regularly.  Has been having frequent crying spells. No SI/HI.     HYPOTHYROIDISM Sometimes forgets to take synthroid.  No obvious syptoms of hypothyroidism.  Last TSH very elevated (10's).  Pt continues to be noncompliant.  Pt is complaining of increased hair growth on face.   ROS: Per HPI  History  Smoking status  . Never Smoker   Smokeless tobacco  . Never Used    O:  Filed Vitals:   03/19/11 0912  BP: 140/79  Pulse: 84  Temp: 98.3 F (36.8 C)    Gen: Upset, tearful and crying at times, consolable CV: RRR, no murmur Pulm: CTA bilat, no wheezes or crackles Abd: soft, NT   A/P: 65 y.o. female p/w multiple medical problems, but with most pressing issue of grief reaction. -See problem list -f/u in 3 weeks

## 2011-03-21 NOTE — Assessment & Plan Note (Signed)
BP 140/79, acceptable esp given that pt is non-compliant with medications. Pt would like to come off of some medicines.  Emphasized the importance of taking her medicines every day so that we can discuss the possibility of decrease the number of medicines she is one. Continue current regimen.

## 2011-03-21 NOTE — Assessment & Plan Note (Signed)
Will check TSH today (synthroid was increased in 11/11 from 200 to 225). No s/s of hypo or hyperthyroid. Pt states not taking medications regularly.

## 2011-03-21 NOTE — Assessment & Plan Note (Signed)
Stable, R>L foot, pt does not feel like she is tripping or having falls.  Will continue neurontin 800mg  TID.

## 2011-03-21 NOTE — Assessment & Plan Note (Signed)
A1c 11.9 today (12.9 09/2010; 13.1 11/11). Pt not taking medicines every day and is not following diabetic diet.  Is taking Lantus 50 once daily rather than 30 BID.  Is not taking novolog.  Misses medicines at least 2x/week. Encouraged pt to continue checking CBGs and write down results (per pt, have been 200s-300s).  Cont metformin 1000 BID, novolog 12, lantus 30 BID, neurontin, quinapril No med changes at this time 2/2 noncompliance.  May need to increase lantus once pt is taking meds regularly. Recheck A1c in 3 months.  Has been trending down over the past 9 months.

## 2011-03-21 NOTE — Assessment & Plan Note (Signed)
Pt's daughter, Velna Ochs, died Feb 24, 2011.  Having a very hard time with it, having crying spells including here in the office, very upset, difficult to console. No SI/HI. Gave numbers for [facility] and [facility].  F/u 3 weeks. Will call pt later this week to check on.

## 2011-04-02 ENCOUNTER — Other Ambulatory Visit: Payer: Self-pay | Admitting: Family Medicine

## 2011-04-02 NOTE — Telephone Encounter (Signed)
Refill request

## 2011-04-04 ENCOUNTER — Other Ambulatory Visit: Payer: Self-pay | Admitting: Family Medicine

## 2011-04-04 DIAGNOSIS — IMO0002 Reserved for concepts with insufficient information to code with codable children: Secondary | ICD-10-CM

## 2011-04-04 DIAGNOSIS — M899 Disorder of bone, unspecified: Secondary | ICD-10-CM

## 2011-04-04 DIAGNOSIS — N3941 Urge incontinence: Secondary | ICD-10-CM

## 2011-04-04 DIAGNOSIS — M949 Disorder of cartilage, unspecified: Secondary | ICD-10-CM

## 2011-04-04 DIAGNOSIS — E890 Postprocedural hypothyroidism: Secondary | ICD-10-CM

## 2011-04-04 DIAGNOSIS — I10 Essential (primary) hypertension: Secondary | ICD-10-CM

## 2011-04-04 DIAGNOSIS — E1165 Type 2 diabetes mellitus with hyperglycemia: Secondary | ICD-10-CM

## 2011-04-04 DIAGNOSIS — K219 Gastro-esophageal reflux disease without esophagitis: Secondary | ICD-10-CM

## 2011-04-04 NOTE — Telephone Encounter (Signed)
Refill request

## 2011-05-04 ENCOUNTER — Other Ambulatory Visit: Payer: Self-pay | Admitting: Family Medicine

## 2011-05-05 NOTE — Telephone Encounter (Signed)
Refill request

## 2011-06-19 ENCOUNTER — Other Ambulatory Visit: Payer: Self-pay | Admitting: Family Medicine

## 2011-06-19 NOTE — Telephone Encounter (Signed)
Refill request

## 2011-07-04 ENCOUNTER — Other Ambulatory Visit: Payer: Self-pay | Admitting: Family Medicine

## 2011-07-04 NOTE — Telephone Encounter (Signed)
Refill request

## 2011-07-09 ENCOUNTER — Other Ambulatory Visit: Payer: Self-pay | Admitting: Family Medicine

## 2011-07-09 NOTE — Telephone Encounter (Signed)
Refill request

## 2011-08-18 ENCOUNTER — Other Ambulatory Visit: Payer: Self-pay | Admitting: Family Medicine

## 2011-08-19 NOTE — Telephone Encounter (Signed)
Refill request

## 2011-08-24 ENCOUNTER — Other Ambulatory Visit: Payer: Self-pay | Admitting: Family Medicine

## 2011-08-24 NOTE — Telephone Encounter (Signed)
Refill request

## 2011-08-25 NOTE — Telephone Encounter (Signed)
Refilled x1 month with 1 refill.  No further refills until seen by PCP.

## 2011-11-20 ENCOUNTER — Ambulatory Visit (INDEPENDENT_AMBULATORY_CARE_PROVIDER_SITE_OTHER): Payer: Medicare Other | Admitting: Family Medicine

## 2011-11-20 ENCOUNTER — Encounter: Payer: Self-pay | Admitting: Family Medicine

## 2011-11-20 VITALS — BP 153/80 | HR 85 | Temp 98.2°F | Ht 63.0 in | Wt 140.0 lb

## 2011-11-20 DIAGNOSIS — E1165 Type 2 diabetes mellitus with hyperglycemia: Secondary | ICD-10-CM

## 2011-11-20 DIAGNOSIS — IMO0002 Reserved for concepts with insufficient information to code with codable children: Secondary | ICD-10-CM

## 2011-11-20 DIAGNOSIS — H579 Unspecified disorder of eye and adnexa: Secondary | ICD-10-CM

## 2011-11-20 DIAGNOSIS — E1149 Type 2 diabetes mellitus with other diabetic neurological complication: Secondary | ICD-10-CM

## 2011-11-20 DIAGNOSIS — E1139 Type 2 diabetes mellitus with other diabetic ophthalmic complication: Secondary | ICD-10-CM

## 2011-11-20 DIAGNOSIS — I10 Essential (primary) hypertension: Secondary | ICD-10-CM

## 2011-11-20 DIAGNOSIS — E118 Type 2 diabetes mellitus with unspecified complications: Secondary | ICD-10-CM

## 2011-11-20 DIAGNOSIS — E1142 Type 2 diabetes mellitus with diabetic polyneuropathy: Secondary | ICD-10-CM

## 2011-11-20 MED ORDER — LEVOTHYROXINE SODIUM 25 MCG PO TABS: 25.0000 ug | ORAL_TABLET | Freq: Every day | ORAL | Status: DC

## 2011-11-20 MED ORDER — INSULIN GLARGINE 100 UNIT/ML ~~LOC~~ SOLN: 30.0000 [IU] | Freq: Two times a day (BID) | SUBCUTANEOUS | Status: DC

## 2011-11-20 MED ORDER — LEVOTHYROXINE SODIUM 200 MCG PO TABS: 200.0000 ug | ORAL_TABLET | Freq: Every day | ORAL | Status: DC

## 2011-11-20 MED ORDER — ASPIRIN EC 325 MG PO TBEC: 325.0000 mg | DELAYED_RELEASE_TABLET | Freq: Every day | ORAL | Status: DC

## 2011-11-20 MED ORDER — QUINAPRIL HCL 40 MG PO TABS: 40.0000 mg | ORAL_TABLET | Freq: Every day | ORAL | Status: DC

## 2011-11-20 MED ORDER — HYDROCHLOROTHIAZIDE 25 MG PO TABS: 25.0000 mg | ORAL_TABLET | Freq: Every day | ORAL | Status: DC

## 2011-11-20 MED ORDER — METFORMIN HCL 1000 MG PO TABS: 1000.0000 mg | ORAL_TABLET | Freq: Two times a day (BID) | ORAL | Status: DC

## 2011-11-20 NOTE — Assessment & Plan Note (Signed)
Lab Results  Component Value Date   HGBA1C 13.6 11/20/2011   A1c 13.6 (was 11.9 03/2011, 12.9 09/2010).  Patient not taking medications.  Instructed to restart lantus and metformin and bring log book to appt next visit.

## 2011-11-20 NOTE — Patient Instructions (Signed)
It was good to see you today. You really need to take your medicines!  It is VERY important for your health.  Please keep checking your sugars! Bring a list of them to me at your next appointment.  The medicines that I would really like you to try to keep taking until you see me next are: -Hydrochlorothiazide 25mg  daily -Lantus 30 units twice per day -Synthroid 219mcg daily -Metformin 1000mg  twice per day -Quinapril 40mg  daily  I am printing your prescriptions so you can take them to any pharmacy that you want.   Come back and see me in 1 month

## 2011-11-20 NOTE — Assessment & Plan Note (Signed)
Elevated BP today. Likely 2/2 noncompliance.  Will restart HCTZ and quinapril.  BMET at next visit.   BP Readings from Last 3 Encounters:  11/20/11 153/80  03/19/11 140/79  10/16/10 143/68

## 2011-11-20 NOTE — Assessment & Plan Note (Signed)
Due for exam 01/2012.

## 2011-11-20 NOTE — Assessment & Plan Note (Signed)
Not taking gabapentin, only wants to restart "essential" medicines.  Will rediscuss at next visit.

## 2011-11-20 NOTE — Progress Notes (Signed)
S: Pt comes in today for follow up.  DIABETES Home CBGs: 200s, none in 300s Meds:  Taking Meds: no # of doses missed per week: has been out of medicines x 20 days Hypoglycemic episodes?: no Last foot exam:  Last eye exam: 01/2011 Symptoms: Polyuria: no Polydipsia: no Parasthesias: no   Dizziness: no  Nausea:  no Vomiting:  no Last A1c:  Lab Results  Component Value Date   HGBA1C 13.6 11/20/2011    HYPERTENSION BP: 153/80, states HHRN said it was "good" this AM Meds: none Taking meds: No    # of doses missed/week: out of meds x 20 days Symptoms: Headache: No Dizziness: No Vision changes: No SOB:  No Chest pain: No LE swelling: No Tobacco use: No   KNOTS Has felt knots on shoulder, neck, and chest.  One on her shoulder has been there x 6 months.  Just started noticing the ones on her chest a few days ago.  No pain or discomfort.  No itching.  Can feel them, but not see them. Nothing makes better or worse.    ROS: Per HPI  History  Smoking status  . Never Smoker   Smokeless tobacco  . Never Used    O:  Filed Vitals:   11/20/11 1535  BP: 153/80  Pulse: 85  Temp: 98.2 F (36.8 C)    Gen: NAD Chest: no abnormalities, "knots" are patient's ribs.  CV: RRR, no murmur Pulm: CTA bilat, no wheezes or crackles Ext: Warm, no chronic skin changes, no edema   A/P: 66 y.o. female p/w HTN, DM,  -See problem list -f/u in 1 month

## 2011-11-23 ENCOUNTER — Other Ambulatory Visit: Payer: Self-pay | Admitting: Family Medicine

## 2011-11-26 ENCOUNTER — Encounter: Payer: Self-pay | Admitting: Home Health Services

## 2011-11-26 ENCOUNTER — Ambulatory Visit (INDEPENDENT_AMBULATORY_CARE_PROVIDER_SITE_OTHER): Payer: Medicare Other | Admitting: Home Health Services

## 2011-11-26 VITALS — BP 113/74 | HR 90 | Temp 98.2°F | Ht 63.0 in | Wt 137.2 lb

## 2011-11-26 DIAGNOSIS — Z0000 Encounter for general adult medical examination without abnormal findings: Secondary | ICD-10-CM

## 2011-11-26 NOTE — Progress Notes (Signed)
Patient here for annual wellness visit, patient reports: Risk Factors/Conditions needing evaluation or treatment: Pt does not have any risk factors that need evaluation. Home Safety: Pt lives by self in 1 story home.  Pt reports having smoke detectors and does not have any adaptive equipment in bathroom. Other Information: Corrective lens: Pt wears corrective lens for reading, visits eye dr annually. Dentures: Pt has full dentures. Memory: Pt denies memory problems. Patient's Mini Mental Score (recorded in doc. flowsheet): 30  Balance/Gait:  Balance Abnormal Patient value  Sitting balance    Sit to stand    Attempts to arise    Immediate standing balance    Standing balance    Nudge    Eyes closed- Romberg    Tandem stance x unable  Back lean    Neck Rotation    360 degree turn    Sitting down     Gait Abnormal Patient value  Initiation of gait    Step length-left    Step length-right    Step height-left    Step height-right    Step symmetry    Step continuity    Path deviation    Trunk movement    Walking stance        Annual Wellness Visit Requirements Recorded Today In  Medical, family, social history Past Medical, Family, Social History Section  Current providers Care team  Current medications Medications  Wt, BP, Ht, BMI Vital signs  Hearing assessment (welcome visit) Hearing/vision  Tobacco, alcohol, illicit drug use History  ADL Nurse Assessment  Depression Screening Nurse Assessment  Cognitive impairment Nurse Assessment  Mini Mental Status Document Flowsheet  Fall Risk Nurse Assessment  Home Safety Progress Note  End of Life Planning (welcome visit) Social Documentation  Medicare preventative services Progress Note  Risk factors/conditions needing evaluation/treatment Progress Note  Personalized health advice Patient Instructions, goals, letter  Diet & Exercise Social Documentation  Emergency Contact Social Documentation  Seat Belts Social  Documentation  Sun exposure/protection Social Documentation    Prevention Plan:   Recommended Medicare Prevention Screenings Women over 65 Test For Frequency Date of Last- BOLD if needed  Breast Cancer 1-2 yrs 2/12  Cervical Cancer 1-3 yrs 11/11  Colorectal Cancer 1-10 yrs 5/06  Osteoporosis once 8/08  Cholesterol 5 yrs 7/09  Diabetes yearly 4/13  HIV yearly declined  Influenza Shot yearly 9/09  Pneumonia Shot once declined  Zostavax Shot once declined

## 2011-11-26 NOTE — Patient Instructions (Signed)
1.Continue to exercise 2-3 times a week at [facility]. 2. Try limiting fried foods, potatoes, rice, pasta, cereal, and bread. 3. Focus on eating fruits and vegetables. 4. Continue to take medications daily!!

## 2011-11-26 NOTE — Progress Notes (Signed)
ADDENDUM I have reviewed this visit and discussed with Lamont Dowdy and agree with her documentation

## 2011-12-06 ENCOUNTER — Telehealth: Payer: Self-pay | Admitting: Family Medicine

## 2011-12-06 MED ORDER — CONTINUOUS GLUCOSE MONITOR SUP MISC: 1.0000 | Freq: Three times a day (TID) | Status: DC

## 2011-12-06 NOTE — Telephone Encounter (Signed)
Patient is calling for a Rx for Accucheck Compact Plus to go to [facility] on [address].

## 2011-12-06 NOTE — Telephone Encounter (Signed)
Rx sent 

## 2011-12-09 ENCOUNTER — Telehealth: Payer: Self-pay | Admitting: Family Medicine

## 2011-12-09 DIAGNOSIS — IMO0002 Reserved for concepts with insufficient information to code with codable children: Secondary | ICD-10-CM

## 2011-12-09 DIAGNOSIS — E1165 Type 2 diabetes mellitus with hyperglycemia: Secondary | ICD-10-CM

## 2011-12-09 NOTE — Telephone Encounter (Signed)
250.92 

## 2011-12-09 NOTE — Telephone Encounter (Signed)
[facility] on [address] needs Dx code for the Meter for her insurance.

## 2011-12-10 MED ORDER — PRODIGY BLOOD GLUCOSE MONITOR DEVI: Status: DC

## 2011-12-10 NOTE — Telephone Encounter (Signed)
Re-Rx'ed to pharmacy. Thanks!

## 2011-12-10 NOTE — Telephone Encounter (Signed)
Pharmacy called with this info, but they need a new Rx escribed with the Dx code attached.

## 2011-12-11 ENCOUNTER — Telehealth: Payer: Self-pay | Admitting: Family Medicine

## 2011-12-11 DIAGNOSIS — E1165 Type 2 diabetes mellitus with hyperglycemia: Secondary | ICD-10-CM

## 2011-12-11 DIAGNOSIS — IMO0002 Reserved for concepts with insufficient information to code with codable children: Secondary | ICD-10-CM

## 2011-12-11 MED ORDER — PRODIGY BLOOD GLUCOSE MONITOR DEVI: Status: DC

## 2011-12-11 NOTE — Telephone Encounter (Signed)
Rx for glucose meter sent to pharmacy.

## 2011-12-11 NOTE — Telephone Encounter (Signed)
Spoke with Dr. Loraine Maple.  Pharmacy did not receive glucometer Rx with Dx code.  Dr. Loraine Maple will resend Rx later today.   [facility] informed.   Nolene Ebbs, RN

## 2011-12-19 ENCOUNTER — Telehealth: Payer: Self-pay | Admitting: Family Medicine

## 2011-12-19 MED ORDER — GLUCOSE BLOOD VI STRP: ORAL_STRIP | Status: DC

## 2011-12-19 NOTE — Telephone Encounter (Signed)
Will forward to MD.  

## 2011-12-19 NOTE — Telephone Encounter (Signed)
Sent in

## 2011-12-19 NOTE — Telephone Encounter (Signed)
States she is need of test strips for her Accucheck Compact plus - order will be new since she just got the machine. [facility] [address]

## 2012-01-22 ENCOUNTER — Ambulatory Visit (INDEPENDENT_AMBULATORY_CARE_PROVIDER_SITE_OTHER): Payer: Medicare Other | Admitting: Family Medicine

## 2012-01-22 ENCOUNTER — Encounter: Payer: Self-pay | Admitting: Family Medicine

## 2012-01-22 VITALS — BP 144/75 | HR 96 | Temp 98.3°F | Ht 63.0 in | Wt 132.0 lb

## 2012-01-22 DIAGNOSIS — R634 Abnormal weight loss: Secondary | ICD-10-CM

## 2012-01-22 HISTORY — DX: Abnormal weight loss: R63.4

## 2012-01-22 LAB — CBC
Hemoglobin: 11.5 g/dL — ABNORMAL LOW (ref 12.0–15.0)
MCHC: 31.3 g/dL (ref 30.0–36.0)
RDW: 13.7 % (ref 11.5–15.5)

## 2012-01-22 LAB — TSH: TSH: 9.105 u[IU]/mL — ABNORMAL HIGH (ref 0.350–4.500)

## 2012-01-22 LAB — COMPREHENSIVE METABOLIC PANEL
ALT: <8 U/L (ref 0–35)
AST: 9 U/L (ref 0–37)
Alkaline Phosphatase: 80 U/L (ref 39–117)
Sodium: 137 mEq/L (ref 135–145)
Total Bilirubin: 0.5 mg/dL (ref 0.3–1.2)
Total Protein: 8.4 g/dL — ABNORMAL HIGH (ref 6.0–8.3)

## 2012-01-22 LAB — POCT SEDIMENTATION RATE: POCT SED RATE: 81 mm/hr — AB (ref 0–22)

## 2012-01-22 NOTE — Progress Notes (Signed)
  Subjective:    Patient ID: [name], female    DOB: August 29, 1945, 66 y.o.   MRN: [id number]  HPI  Patient was scheduled with me because told scheduler she wanted to talk with me about her daughters death (I was her 61)  It is more so that she is concerned she might have a severe disease because she is losing weight   Weight loss Has been losing weight for months.  Feels tired and sad.   No fever or nausea or vomiting or bleeding adenopathy or change in bowel movement or rash.  Takes her medications intermittently.    PMH Poorly controlled diabetes     Review of Systems     Objective:   Physical Exam Alert slightly hyper acting with mild flight of ideas  Heart - Regular rate and rhythm.  No murmurs, gallops or rubs.    Lungs:  Normal respiratory effort, chest expands symmetrically. Lungs are clear to auscultation, no crackles or wheezes. Extremities:  No cyanosis, edema, or deformity noted with good range of motion of all major joints.         Assessment & Plan:

## 2012-01-22 NOTE — Assessment & Plan Note (Addendum)
Does have 30 lb weight loss over the last 2 years.   Will check screening labs and refer back to PCP.  Large differential including poorly controlled diabetes, depression and occult cancer.

## 2012-01-22 NOTE — Patient Instructions (Signed)
See Dr Loraine Maple in the next 2-3 weeks  Bring in all your medication bottles and your blood sugar readings  I will call you if your lab tests are not normal.  Otherwise discuss them at your next visit.

## 2012-01-23 ENCOUNTER — Encounter: Payer: Self-pay | Admitting: Family Medicine

## 2012-01-28 ENCOUNTER — Telehealth: Payer: Self-pay | Admitting: Family Medicine

## 2012-01-28 NOTE — Telephone Encounter (Signed)
Done. Faxed. Javier Glazier, Gerrit Heck

## 2012-01-28 NOTE — Telephone Encounter (Signed)
Patient is at Dr. Shauna Hugh office and forgot to take her labs with her and she is asking if a copy can be faxed to his office.  His fax # is [phone].

## 2012-02-10 ENCOUNTER — Encounter: Payer: Self-pay | Admitting: Family Medicine

## 2012-02-11 ENCOUNTER — Telehealth: Payer: Self-pay | Admitting: Family Medicine

## 2012-02-11 ENCOUNTER — Ambulatory Visit (INDEPENDENT_AMBULATORY_CARE_PROVIDER_SITE_OTHER): Payer: Medicare Other | Admitting: Family Medicine

## 2012-02-11 VITALS — BP 117/70 | HR 99 | Temp 98.6°F | Ht 63.0 in | Wt 130.0 lb

## 2012-02-11 DIAGNOSIS — B029 Zoster without complications: Secondary | ICD-10-CM

## 2012-02-11 MED ORDER — DIPHENHYDRAMINE HCL 2 % EX CREA: TOPICAL_CREAM | Freq: Three times a day (TID) | CUTANEOUS | Status: DC | PRN

## 2012-02-11 MED ORDER — VALACYCLOVIR HCL 1 G PO TABS: 1000.0000 mg | ORAL_TABLET | Freq: Three times a day (TID) | ORAL | Status: AC

## 2012-02-11 NOTE — Progress Notes (Signed)
Subjective:     Patient ID: [name], female   DOB: [birth year], 66 y.o.   MRN: [id number]  HPI 66 yo F presents for same day visit with a complaint of pruritic rash on her chest x 4 days. She first noticed itching three 4 nights ago. She thought it was due to a bug bite but the rash that followed became progressively worse despite OTC with anti-itch lotion and hydrocortisone cream. The rash spread from her L chest to her L forearm and L upper back. She has not yet received the Zostavax. She takes methotrexate but reports starting it 2 days ago.  She admits to some burning when she applies creams or lotion to the rash. She admits to scratching the rash. She denies fever. She denies sleep disturbance.   Review of Systems As per HPI    Objective:   Physical Exam  Constitutional: She appears well-developed and well-nourished. No distress.  Skin: Skin is warm and dry. Rash noted. Rash is vesicular.      Assessment and Plan:  Herpes Zoster infection P: -valtrex 1000 mg TID X 7 days -benadryl cream prn itching -tylenol prn pain as symptoms are mild  -f/u in 2 weeks with PCP -precautions regarding skin to skin spread and information per AVS.

## 2012-02-11 NOTE — Telephone Encounter (Signed)
Patient is calling because she has had a rash that is very painful that started last Thursday and she wants to be seen.

## 2012-02-11 NOTE — Telephone Encounter (Addendum)
Returned call to patient.  C/o rash since 02/06/12 on left shoulder, arm, back.  Has been using OTC anti-itch lotion.  Rash is not better, but lotion helps with itching.  Would like appt for today.  [facility]t scheduled [facility] and patient will be here by 11:00am.  Nolene Ebbs, RN

## 2012-02-11 NOTE — Assessment & Plan Note (Signed)
Herpes Zoster infection P: -valtrex 1000 mg TID X 7 days -benadryl cream prn itching -tylenol prn pain as symptoms are mild  -f/u in 2 weeks with PCP -precautions regarding skin to skin spread and information per AVS.

## 2012-02-11 NOTE — Patient Instructions (Addendum)
[name],  Thank you for coming in to see me today. Please take the valtrex three times a day and use the benadryl cream for itching.  Your may take tylenol for pain.  Pleas stop using the hydrocortisone cream.  Avoid scratching and make sure to wash hands/avoid skin to skin contact as the rash is contagious.  F/u with Dr. Loraine Maple in two weeks to f/u rash and review labs per Dr. Erin Hearing' letter.   Dr. Adrian Blackwater   Shingles Shingles is caused by the same virus that causes chickenpox (varicella zoster virus or VZV). Shingles often occurs many years or decades after having chickenpox. That is why it is more common in adults older than 50 years. The virus reactivates and breaks out as an infection in a nerve root. SYMPTOMS   The initial feeling (sensations) may be pain. This pain is usually described as:   Burning.   Stabbing.   Throbbing.   Tingling in the nerve root.   A red rash will follow in a couple days. The rash may occur in any area of the body and is usually on one side (unilateral) of the body in a band or belt-like pattern. The rash usually starts out as very small blisters (vesicles). They will dry up after 7 to 10 days. This is not usually a significant problem except for the pain it causes.   Long-lasting (chronic) pain is more likely in an elderly person. It can last months to years. This condition is called postherpetic neuralgia.  Shingles can be an extremely severe infection in someone with AIDS, a weakened immune system, or with forms of leukemia. It can also be severe if you are taking transplant medicines or other medicines that weaken the immune system. TREATMENT  Your caregiver will often treat you with:  Antiviral drugs.   Anti-inflammatory drugs.   Pain medicines.  Bed rest is very important in preventing the pain associated with herpes zoster (postherpetic neuralgia). Application of heat in the form of a hot water bottle or electric heating pad or gentle  pressure with the hand is recommended to help with the pain or discomfort. PREVENTION  A varicella zoster vaccine is available to help protect against the virus. The Food and Drug Administration approved the varicella zoster vaccine for individuals 58 years of age and older. HOME CARE INSTRUCTIONS   Cool compresses to the area of rash may be helpful.   Only take over-the-counter or prescription medicines for pain, discomfort, or fever as directed by your caregiver.   Avoid contact with:   Babies.   Pregnant women.   Children with eczema.   Elderly people with transplants.   People with chronic illnesses, such as leukemia and AIDS.   If the area involved is on your face, you may receive a referral for follow-up to a specialist. It is very important to keep all follow-up appointments. This will help avoid eye complications, chronic pain, or disability.  SEEK IMMEDIATE MEDICAL CARE IF:   You develop any pain (headache) in the area of the face or eye. This must be followed carefully by your caregiver or ophthalmologist. An infection in part of your eye (cornea) can be very serious. It could lead to blindness.   You do not have pain relief from prescribed medicines.   Your redness or swelling spreads.   The area involved becomes very swollen and painful.   You have a fever.   You notice any red or painful lines extending away from the  affected area toward your heart (lymphangitis).   Your condition is worsening or has changed.  Document Released: 08/05/2005 Document Revised: 07/25/2011 Document Reviewed: 07/10/2009 [facility] Patient Information 2012 [place].

## 2012-02-17 ENCOUNTER — Telehealth: Payer: Self-pay | Admitting: Family Medicine

## 2012-02-17 ENCOUNTER — Encounter: Payer: Self-pay | Admitting: Family Medicine

## 2012-02-17 ENCOUNTER — Ambulatory Visit (INDEPENDENT_AMBULATORY_CARE_PROVIDER_SITE_OTHER): Payer: Medicare Other | Admitting: Family Medicine

## 2012-02-17 ENCOUNTER — Telehealth: Payer: Self-pay | Admitting: *Deleted

## 2012-02-17 VITALS — BP 148/78 | HR 97 | Ht 62.0 in | Wt 127.0 lb

## 2012-02-17 DIAGNOSIS — E118 Type 2 diabetes mellitus with unspecified complications: Secondary | ICD-10-CM

## 2012-02-17 DIAGNOSIS — R634 Abnormal weight loss: Secondary | ICD-10-CM

## 2012-02-17 DIAGNOSIS — E890 Postprocedural hypothyroidism: Secondary | ICD-10-CM

## 2012-02-17 DIAGNOSIS — IMO0002 Reserved for concepts with insufficient information to code with codable children: Secondary | ICD-10-CM

## 2012-02-17 DIAGNOSIS — E1165 Type 2 diabetes mellitus with hyperglycemia: Secondary | ICD-10-CM

## 2012-02-17 DIAGNOSIS — E119 Type 2 diabetes mellitus without complications: Secondary | ICD-10-CM

## 2012-02-17 DIAGNOSIS — I10 Essential (primary) hypertension: Secondary | ICD-10-CM

## 2012-02-17 MED ORDER — INSULIN GLARGINE 100 UNIT/ML ~~LOC~~ SOLN: 35.0000 [IU] | Freq: Two times a day (BID) | SUBCUTANEOUS | Status: DC

## 2012-02-17 NOTE — Telephone Encounter (Signed)
Patient is calling to let MD know the dosage of the Levothyroxin.  It is 213mcg.  She has 1 bottle that is 238mcg and the other is 25 mcg.

## 2012-02-17 NOTE — Assessment & Plan Note (Addendum)
Unclear etiology.  Occult cancer still in ddx.  GI referral for colonoscopy placed.  On labs 01/22/12, mild normochromic, normocytic anemia (11.5); CMET WNL with exception of slightly low albumin and total protein. TSH mildly elevated. CBG elevated to 274.   Continue to monitor.  Pt thinks most likely source is anxiety, but pt denies referral to Dr. Gwenlyn Saran at this time or starting antidepressant.

## 2012-02-17 NOTE — Assessment & Plan Note (Signed)
Poor compliance.  Only taking metformin daily.  Has not taken lantus yet today-- CBG 278 in lab today.  A1z 11.3, which is improved from 13.6 3 months ago.  Increase lantus to 35U BID from 30U BID.  F/u 1 month.

## 2012-02-17 NOTE — Telephone Encounter (Signed)
Called pt and lvm to see if she has ever seen a GI in the past.[name], [place]

## 2012-02-17 NOTE — Patient Instructions (Addendum)
It was good to see you today.  I think you should get a colonoscopy done to work up this weight loss.   Make sure you take your blood pressure medicine EVERY day.  Please check HOW MUCH synthroid you are taking-- your thyroid test was not quite normal and I need to know HOW [facility] you are taking now in order to help.  INCREASE your lantus from 30 units to 35 units TWICE a day.  Please make an appointment with Lamont Dowdy, our health coach, for your annual wellness visit.  Come back in 1 month.  We will give you your pneumonia and shingles vaccines then.

## 2012-02-17 NOTE — Assessment & Plan Note (Signed)
TSH checked 1 month ago, elevated to 9.  Likely due to poor compliance, as with her other medical conditions.  No overt signs of hypothyroidism.  Is having weight LOSS not gain.  No temp intolerance.  Pt called and reports she IS taking 228mcg as Rx'ed.  Will discuss degree of noncompliance with pt at next visit and debate on increase in synthroid at that time.

## 2012-02-17 NOTE — Telephone Encounter (Signed)
lvm for pt to call back regarding GI.[name] [place]

## 2012-02-17 NOTE — Progress Notes (Signed)
S: Pt comes in today for follow up.  HYPERTENSION BP: 148/78 Meds: quinapril 40, HCTZ 25 Taking meds: Yes     # of doses missed/week: 3-- not taken yet today Symptoms: Headache: No Dizziness: No Vision changes: No SOB:  No Chest pain: No LE swelling: No Tobacco use: No   DIABETES Home CBGs: 147-336; mostly around 200 Meds: metformin 1000 BID, lantus 30 BID Taking Meds: yes- only taking metformin daily  # of doses missed per week: 3 Hypoglycemic episodes?: yes : ex today came in feeling like her sugar was low but had not eaten all day Last foot exam: TODAY Last eye exam: 01/2011 Symptoms: Polyuria: no Polydipsia: no Parasthesias: no   Dizziness: no  Nausea:  no Vomiting:  no Last A1c: 13 in 11/2011, rechecked today -- 11.3   WEIGHT LOSS Continues to lose weight-- thinks it is due to high amount of stress in her life.  Needs to get back into the Bible.  Is unsure if she wants to see therapist because her family will think she's crazy or nuts.  No N/V/D.  No change in bowel habits.  No blood in stool.   ROS: Per HPI  History  Smoking status  . Never Smoker   Smokeless tobacco  . Never Used    O:  Filed Vitals:   02/17/12 1401  BP: 148/78  Pulse: 97    Gen: NAD CV: RRR, no murmur Pulm: CTA bilat, no wheezes or crackles Ext: Warm, no chronic skin changes, no edema Foot exam: normal sensation, no ulcers or calluses, toe nails long, none ingrown     A/P: 66 y.o. female p/w HTN, DM -See problem list -f/u in 1 month

## 2012-02-17 NOTE — Assessment & Plan Note (Signed)
Elevated today, but pt reports poor compliance.  Encouraged taking HCTZ 25 and quinapril 40 daily.  BMET last month WNL.  F/u 1 month.  BP Readings from Last 3 Encounters:  02/17/12 148/78  02/11/12 117/70  01/22/12 144/75

## 2012-02-18 NOTE — Telephone Encounter (Signed)
Pt has not seen a GI doctor.[name]

## 2012-03-09 ENCOUNTER — Telehealth: Payer: Self-pay | Admitting: Family Medicine

## 2012-03-09 NOTE — Telephone Encounter (Signed)
Fwd. To PCP for Rx. Javier Glazier, Gerrit Heck

## 2012-03-09 NOTE — Telephone Encounter (Signed)
Pt need rx for Zostavax vaccine called to pharmacy.  She will either have it done there or bring to [place].  [facility] on [address] is where she need it to go.

## 2012-03-10 ENCOUNTER — Telehealth: Payer: Self-pay | Admitting: Family Medicine

## 2012-03-10 MED ORDER — ZOSTER VACCINE LIVE 19400 UNT/0.65ML ~~LOC~~ SOLR: 0.6500 mL | Freq: Once | SUBCUTANEOUS | Status: AC

## 2012-03-10 NOTE — Telephone Encounter (Signed)
Sent to pharmacy. Thanks

## 2012-03-10 NOTE — Telephone Encounter (Signed)
Waiting for pt to call back. Please tell pt, that the Zostavax (shingle vaccination) is not available at [facility]. We can send the Rx to: [facility]/or [place] She can pick a pharmacy and we will send the Rx .[place] Pt called back and request written Rx. I will put it up front for her to pick up.

## 2012-03-10 NOTE — Telephone Encounter (Signed)
PLEASE CALL PATIENT ABOUT HER ZOSTAVAX RX

## 2012-03-10 NOTE — Telephone Encounter (Signed)
Left message for patient to return call. Please tell her the Rx for zostavax was sent to pharmacy. It would be better if she has it administered at the pharmacy due to the vaccine needs to remain frozen.[place], [place]

## 2012-03-12 ENCOUNTER — Encounter: Payer: Self-pay | Admitting: Internal Medicine

## 2012-03-12 ENCOUNTER — Telehealth: Payer: Self-pay | Admitting: *Deleted

## 2012-03-12 NOTE — Telephone Encounter (Signed)
Called pt and told her that [place] has attempted to call her to schedule an appt. She informed me that her grandson has had her phone. She asked if I could hang up and leave a VM with their # [phone]. I agreed.[name]

## 2012-03-27 ENCOUNTER — Ambulatory Visit: Payer: Medicare Other | Admitting: Family Medicine

## 2012-04-10 ENCOUNTER — Encounter: Payer: Self-pay | Admitting: Family Medicine

## 2012-04-10 ENCOUNTER — Ambulatory Visit (INDEPENDENT_AMBULATORY_CARE_PROVIDER_SITE_OTHER): Payer: Medicare Other | Admitting: Family Medicine

## 2012-04-10 VITALS — BP 106/66 | HR 101 | Ht 62.0 in | Wt 130.8 lb

## 2012-04-10 DIAGNOSIS — R634 Abnormal weight loss: Secondary | ICD-10-CM

## 2012-04-10 DIAGNOSIS — IMO0002 Reserved for concepts with insufficient information to code with codable children: Secondary | ICD-10-CM

## 2012-04-10 DIAGNOSIS — I10 Essential (primary) hypertension: Secondary | ICD-10-CM

## 2012-04-10 DIAGNOSIS — E118 Type 2 diabetes mellitus with unspecified complications: Secondary | ICD-10-CM

## 2012-04-10 DIAGNOSIS — E890 Postprocedural hypothyroidism: Secondary | ICD-10-CM

## 2012-04-10 LAB — BASIC METABOLIC PANEL
CO2: 29 mEq/L (ref 19–32)
Calcium: 9.1 mg/dL (ref 8.4–10.5)
Chloride: 100 mEq/L (ref 96–112)
Creat: 0.9 mg/dL (ref 0.50–1.10)
Glucose, Bld: 159 mg/dL — ABNORMAL HIGH (ref 70–99)

## 2012-04-10 LAB — TSH: TSH: 6.955 u[IU]/mL — ABNORMAL HIGH (ref 0.350–4.500)

## 2012-04-10 MED ORDER — GLUCOSE BLOOD VI STRP: ORAL_STRIP | Status: DC

## 2012-04-10 MED ORDER — ONETOUCH ULTRASOFT LANCETS MISC: Status: DC

## 2012-04-10 NOTE — Assessment & Plan Note (Signed)
Pt reports she has only been compliant since last office visit 1.5-2 months ago.  Agreeable to rechecking TSH today.  If still elevated, will consider increasing synthroid from 267mcg.

## 2012-04-10 NOTE — Assessment & Plan Note (Signed)
BP better today, pt reports only missing a few doses of her HCTZ 25 and quinapril 40.  Will continue.  BP Readings from Last 3 Encounters:  04/10/12 106/66  02/17/12 148/78  02/11/12 117/70

## 2012-04-10 NOTE — Assessment & Plan Note (Signed)
3lb weight gain.  Is going for colonoscopy next week.  F/u in 3-4 weeks.  Wt Readings from Last 3 Encounters:  04/10/12 130 lb 12.8 oz (59.33 kg)  02/17/12 127 lb (57.607 kg)  02/11/12 130 lb (58.968 kg)

## 2012-04-10 NOTE — Patient Instructions (Signed)
It was good to see you today.  INCREASE your lantus to 35 units twice a day.  We're checking your thyroid to see if we need to make any medicine changes now that you are taking your medicine every day.  Otherwise, your blood pressure looks great, so no changes for that.  I'll see you back in 1 month with a list of your blood sugars!

## 2012-04-10 NOTE — Progress Notes (Signed)
S: Pt comes in today for follow up.  WEIGHT LOSS Weight is stable, up 3# from last visit.  Wt Readings from Last 3 Encounters:  04/10/12 130 lb 12.8 oz (59.33 kg)  02/17/12 127 lb (57.607 kg)  02/11/12 130 lb (58.968 kg)    HYPOTHYROIDISM Is on Synthroid already, but has known non-compliance.  Pt reports she takes her medication every day, but states she only started doing it at our last visit (02/17/12).  Amenable to TSH today.    DIABETES Home CBGs: highest 270, lowest 120; has been eating abnormally this month so isn't sure what they have been Meds: metformin 1000 BID, lantus 35 BID (increased from 30U BID on 02/17/12) --> was still only taking 30U BID Taking Meds: yes # of doses missed per week: misses at least 3 doses a day Hypoglycemic episodes?: no Symptoms: Polyuria: no Polydipsia: no Parasthesias: yes   Dizziness: yes  Nausea:  no Vomiting:  no Last A1c:  Lab Results  Component Value Date   HGBA1C 11.3 02/17/2012     HYPERTENSION BP: 106/66 Meds: quinapril 40, HCTZ 25 Taking meds: Yes     # of doses missed/week: 0 Symptoms: Headache: No Dizziness: Yes : when using eye drops Vision changes: No-- but has cataracts and retina is detaching per pt report SOB:  No Chest pain: No LE swelling: No Tobacco use: No    ROS: Per HPI  History  Smoking status  . Never Smoker   Smokeless tobacco  . Never Used    O:  Filed Vitals:   04/10/12 1450  BP: 106/66  Pulse: 101    Gen: NAD CV: RRR, no murmur Pulm: CTA bilat, no wheezes or crackles Ext: Warm, swelling in right wrist and left ankle most significantly-- no warmth, erythema or tenderness, per pt they don't feel different than usual.    A/P: 66 y.o. female p/w HTN, DM, weight loss, hypothyroidism -See problem list -f/u in 3 weeks with CBGs

## 2012-04-10 NOTE — Assessment & Plan Note (Signed)
Pt reports only missing a total of 3/14 doses of lantus.  Did not increase from 30 to 35U as directed at last visit.  Did not bring a log of CBGs but reports they are still elevated.  Pt will increase to 35U TID and continue metformin.  F/u 3-4 weeks w/ CBGs.  Next Hgb A1c due 05/19/12.   Lab Results  Component Value Date   HGBA1C 11.3 02/17/2012

## 2012-04-12 ENCOUNTER — Encounter: Payer: Self-pay | Admitting: Family Medicine

## 2012-04-13 ENCOUNTER — Encounter: Payer: Self-pay | Admitting: Internal Medicine

## 2012-04-13 ENCOUNTER — Other Ambulatory Visit: Payer: Self-pay | Admitting: *Deleted

## 2012-04-13 ENCOUNTER — Ambulatory Visit (INDEPENDENT_AMBULATORY_CARE_PROVIDER_SITE_OTHER): Payer: Medicare Other | Admitting: Internal Medicine

## 2012-04-13 VITALS — BP 100/60 | HR 108 | Ht 62.0 in | Wt 127.0 lb

## 2012-04-13 DIAGNOSIS — E119 Type 2 diabetes mellitus without complications: Secondary | ICD-10-CM

## 2012-04-13 DIAGNOSIS — R634 Abnormal weight loss: Secondary | ICD-10-CM

## 2012-04-13 DIAGNOSIS — Z800 Family history of malignant neoplasm of digestive organs: Secondary | ICD-10-CM

## 2012-04-13 MED ORDER — ACCU-CHEK SOFT TOUCH LANCETS MISC: Status: DC

## 2012-04-13 MED ORDER — MOVIPREP 100 G PO SOLR: 1.0000 | Freq: Once | ORAL | Status: DC

## 2012-04-13 NOTE — Patient Instructions (Addendum)
You have been scheduled for a colonoscopy with propofol. Please follow written instructions given to you at your visit today.  Please pick up your prep kit at the pharmacy within the next 1-3 days. If you use inhalers (even only as needed), please bring them with you on the day of your procedure.  

## 2012-04-13 NOTE — Progress Notes (Signed)
HISTORY OF PRESENT ILLNESS:  [name] is a 66 y.o. female with hypertension, rheumatoid arthritis, poorly controlled diabetes mellitus, and psychiatric illness. She sent today by the family medicine clinic regarding weight loss and the need for screening colonoscopy. Patient has lost 20 pounds over the past year. She attributes this to lack of interest in eating since her daughter's death in 02-16-11. Review of outside laboratories shows poor diabetic control with hemoglobin A1c in the 11-14 range over the past year. She takes methotrexate and prednisone for arthritis. Her GI review of systems is entirely negative. She does have a brother with a history of colon cancer. She has had what sounds like flexible sigmoidoscopy, but denies complete colonoscopy. Hemoglobin in February 16, 2023 was 11.5.  REVIEW OF SYSTEMS:  All non-GI ROS negative except for depression, muscle pains, leg swelling  Past Medical History  Diagnosis Date  . Diabetes mellitus age 33  . Retinopathy due to secondary diabetes mellitus     L>R, laser 3/07  . Retinal detachment, old, partial     left  . Schizotypal personality disorder   . Hypertension   . Thyroid disease     Past Surgical History  Procedure Date  . Thyroid radiation ablation     for Graves Disease  . Transthoracic echocardiogram      EF55-65%, nml - 12/14/2004  . Breast excisional biopsy   . Rotator cuff repair 1990's    left  . Breast surgery   . Cataract extraction     left     Social History [name]  reports that she has never smoked. She has never used smokeless tobacco. She reports that she does not drink alcohol or use illicit drugs.  family history includes Breast cancer in her other; Colon cancer in her brother and maternal grandmother; Heart disease in her mother; Hypertension in her mother; and Lymphoma in her sister.  Allergies  Allergen Reactions  . Rosiglitazone Maleate     REACTION: Difficulty walking, Fatigue, shortness of  breath       PHYSICAL EXAMINATION: Vital signs: BP 100/60  Pulse 108  Ht 5\' 2"  (1.575 m)  Wt 127 lb (57.607 kg)  BMI 23.23 kg/m2  Constitutional: generally well-appearing, no acute distress Psychiatric: alert and oriented x3, cooperative Eyes: extraocular movements intact, anicteric, conjunctiva pink Mouth: oral pharynx moist, no lesions Neck: supple no lymphadenopathy Cardiovascular: heart regular rate and rhythm, no murmur Lungs: clear to auscultation bilaterally Abdomen: soft, nontender, nondistended, no obvious ascites, no peritoneal signs, normal bowel sounds, no organomegaly Rectal:deferred until colonoscopy Extremities: no lower extremity edema bilaterally. Some deformities of arthritis Skin: no lesions on visible extremities Neuro: No focal deficits.   ASSESSMENT:  #1. Weight loss. May be a combination of decreased by mouth intake and general medical problems including poorly controlled diabetes. Nothing to suggest a primary gastroenterologic disorder. #2. Family history of colon cancer. The patient has not had screening #3. Multiple medical problems including diabetes  PLAN:  #1. Screening colonoscopy.The nature of the procedure, as well as the risks, benefits, and alternatives were carefully and thoroughly reviewed with the patient. Ample time for discussion and questions allowed. The patient understood, was satisfied, and agreed to proceed.  #2. Movi prep prescribed. The patient instructed on its use #3. Hold insulin and metformin the morning of the procedure until normal by mouth intake resumed. We will monitor her blood sugar immediately before and after her procedure. #4. Resume general medical care with her primary provider

## 2012-04-14 ENCOUNTER — Encounter: Payer: Self-pay | Admitting: Internal Medicine

## 2012-04-18 ENCOUNTER — Other Ambulatory Visit: Payer: Self-pay | Admitting: Family Medicine

## 2012-05-11 ENCOUNTER — Ambulatory Visit: Payer: Medicare Other | Admitting: Family Medicine

## 2012-05-13 ENCOUNTER — Encounter: Payer: Medicare Other | Admitting: Internal Medicine

## 2012-05-14 ENCOUNTER — Encounter: Payer: Self-pay | Admitting: Family Medicine

## 2012-05-14 ENCOUNTER — Ambulatory Visit (INDEPENDENT_AMBULATORY_CARE_PROVIDER_SITE_OTHER): Payer: Medicare Other | Admitting: Family Medicine

## 2012-05-14 VITALS — BP 151/83 | HR 90 | Ht 62.0 in | Wt 133.0 lb

## 2012-05-14 DIAGNOSIS — R05 Cough: Secondary | ICD-10-CM

## 2012-05-14 DIAGNOSIS — IMO0002 Reserved for concepts with insufficient information to code with codable children: Secondary | ICD-10-CM

## 2012-05-14 DIAGNOSIS — R51 Headache: Secondary | ICD-10-CM

## 2012-05-14 DIAGNOSIS — E890 Postprocedural hypothyroidism: Secondary | ICD-10-CM

## 2012-05-14 DIAGNOSIS — Z23 Encounter for immunization: Secondary | ICD-10-CM

## 2012-05-14 DIAGNOSIS — R519 Headache, unspecified: Secondary | ICD-10-CM | POA: Insufficient documentation

## 2012-05-14 DIAGNOSIS — E1165 Type 2 diabetes mellitus with hyperglycemia: Secondary | ICD-10-CM

## 2012-05-14 DIAGNOSIS — R059 Cough, unspecified: Secondary | ICD-10-CM | POA: Insufficient documentation

## 2012-05-14 DIAGNOSIS — E118 Type 2 diabetes mellitus with unspecified complications: Secondary | ICD-10-CM

## 2012-05-14 DIAGNOSIS — I10 Essential (primary) hypertension: Secondary | ICD-10-CM

## 2012-05-14 LAB — POCT GLYCOSYLATED HEMOGLOBIN (HGB A1C): Hemoglobin A1C: 10.4

## 2012-05-14 MED ORDER — FLUTICASONE PROPIONATE 50 MCG/ACT NA SUSP: 1.0000 | Freq: Every day | NASAL | Status: DC

## 2012-05-14 MED ORDER — ALBUTEROL SULFATE HFA 108 (90 BASE) MCG/ACT IN AERS: 2.0000 | INHALATION_SPRAY | RESPIRATORY_TRACT | Status: DC | PRN

## 2012-05-14 NOTE — Assessment & Plan Note (Addendum)
Pt reports taking synthroid 257mcg; TSH slightly elevated to 6.9 last month (down from 9.1 01/2012); no changes today since making other med changes and pt prefers 1 change at a time. Consider rechecking TSH next visit.  Pt also advised to take synthroid separately from other medications to improve absorption.

## 2012-05-14 NOTE — Assessment & Plan Note (Addendum)
Pt still only taking 60 units total. Will again counsel to increase to 35 BID. Pt would like to see Dr. Jenne Campus for nutrition help; referral ordered.  Lab Results  Component Value Date   HGBA1C 10.4 05/14/2012

## 2012-05-14 NOTE — Progress Notes (Signed)
S: Pt comes in today for follow up.  HEADACHE Feels like there are knots in her head.  Feels like the same pain as she has in her arms, which is due to her fibromyalgia.  Pain started near her neck but is now her whole head. Has been coughing a lot (see below).  Took 2 prednisone one day to see if it would help (on it for her RA) but isn't sure if it actually helped.  Hasn't tried anything else.  Sometimes get dizzy. No vision changes. Comes and goes in intensity.   COUGH Initially had runny nose and now has cough for the past few weeks.  Hurts her chest to cough.  Throat is a little sore.  Cough gets worse when she lies down at night time.  Feels congested still.  Has been using cough drops, which she thought were sugar free but turns out they aren't.  No fevers.   HYPERTENSION BP: 151/83 Meds: quinapril 40, HCTZ 25 Taking meds: Yes     # of doses missed/week: 0 Symptoms: Headache: Yes  Dizziness: No Vision changes: No SOB:  Yes : w/ cough Chest pain: Yes : MSK/ with cough LE swelling: No Tobacco use: No   DIABETES Home CBGs: all >200 Meds: metformin 1000 BID, lantus 35 BID Taking Meds: yes but only taking lantus 60 total # of doses missed per week: 0 Hypoglycemic episodes?: no Symptoms: Polyuria: no Polydipsia: no Parasthesias: yes   Dizziness: no  Nausea:  no Vomiting:  no Last A1c:  Lab Results  Component Value Date   HGBA1C 10.4 05/14/2012   -Would like to see Dr. Jenne Campus for diet help   HYPOTHYROIDISM On synthroid 245mcg, last TSH was elevated to 6.9. Reports taking medications all together (meaning all medicines at the same time every morning).   WEIGHT LOSS Appetite has increased. Has gained some weight.   Wt Readings from Last 3 Encounters:  05/14/12 133 lb (60.328 kg)  04/13/12 127 lb (57.607 kg)  04/10/12 130 lb 12.8 oz (59.33 kg)       ROS: Per HPI  History  Smoking status  . Never Smoker   Smokeless tobacco  . Never Used    O:  Filed Vitals:   05/14/12 1439  BP: 151/83  Pulse: 90    Gen: NAD HEENT: MMM, +congestion, nasal mucosa normal, mild pharyngeal erythema w/o exudate, no cobblestoning, no cervical LAD CV: RRR, no murmur Pulm: CTA bilat, no wheezes or crackles   A/P: 66 y.o. female p/w multiple problems -See problem list -f/u in 1 month

## 2012-05-14 NOTE — Patient Instructions (Signed)
It was good to see you today.  For your headaches, try taking Tylenol.  For your cough, I refilled your flonase.  Use SUGAR FREE cough drops.  I am sending in an inhaler- don't use it more than every 3-4 hours.   For your diabetes, INCREASE YOUR LANTUS TO 35 UNITS TWICE A DAY (70 units total a day).  For your blood pressure, keep everything the same.  For your thyroid, keep everything the same for now. Take this medicine by itself.   Come back in 1 month.

## 2012-05-14 NOTE — Assessment & Plan Note (Signed)
Tension vs from coughing. Tylenol PRN. No neuro symptoms or other red flags. F/u if not improving.

## 2012-05-14 NOTE — Assessment & Plan Note (Addendum)
Likely 2/2 cold vs allergies (post-nasal drip). Pt would prefer to try albuterol over po med (ex claritin or zyrtec). No fevers, lungs clear, + upper airway congestion; f/u if not improving.  Flonase helping and refilled.

## 2012-05-14 NOTE — Assessment & Plan Note (Signed)
BP elevated today but pt reports compliance.  Had low-ish BP at last 2 visits.  no changes today since making other med changes and pt prefers 1 change at a time  BP Readings from Last 3 Encounters:  05/14/12 151/83  04/13/12 100/60  04/10/12 106/66

## 2012-05-25 ENCOUNTER — Other Ambulatory Visit: Payer: Self-pay | Admitting: Family Medicine

## 2012-05-28 ENCOUNTER — Other Ambulatory Visit: Payer: Self-pay | Admitting: Family Medicine

## 2012-06-01 ENCOUNTER — Encounter: Payer: Self-pay | Admitting: Family Medicine

## 2012-06-30 ENCOUNTER — Other Ambulatory Visit: Payer: Self-pay | Admitting: Family Medicine

## 2012-07-01 ENCOUNTER — Encounter: Payer: Self-pay | Admitting: Home Health Services

## 2012-07-29 ENCOUNTER — Other Ambulatory Visit: Payer: Self-pay | Admitting: Family Medicine

## 2012-10-14 ENCOUNTER — Telehealth: Payer: Self-pay | Admitting: Family Medicine

## 2012-10-14 DIAGNOSIS — IMO0002 Reserved for concepts with insufficient information to code with codable children: Secondary | ICD-10-CM

## 2012-10-14 MED ORDER — PRODIGY BLOOD GLUCOSE MONITOR DEVI: Status: DC

## 2012-10-14 NOTE — Telephone Encounter (Signed)
Spoke with patient, she lost her glucometer and test strips. I told her I would send in another one but not sure that her insurance would pay for it.[name], Kevin Fenton

## 2012-10-14 NOTE — Telephone Encounter (Signed)
Patient states that she has misplaced her script for a diabetic meter and test strips and needs to get another script. Please call patient when scripts are ready.

## 2012-10-16 ENCOUNTER — Other Ambulatory Visit: Payer: Self-pay | Admitting: *Deleted

## 2012-10-16 DIAGNOSIS — E118 Type 2 diabetes mellitus with unspecified complications: Secondary | ICD-10-CM

## 2012-10-16 DIAGNOSIS — E1165 Type 2 diabetes mellitus with hyperglycemia: Secondary | ICD-10-CM

## 2012-10-16 DIAGNOSIS — IMO0002 Reserved for concepts with insufficient information to code with codable children: Secondary | ICD-10-CM

## 2012-10-16 MED ORDER — PRODIGY BLOOD GLUCOSE MONITOR DEVI: Status: DC

## 2012-10-25 ENCOUNTER — Other Ambulatory Visit: Payer: Self-pay | Admitting: Family Medicine

## 2012-11-04 ENCOUNTER — Encounter: Payer: Self-pay | Admitting: *Deleted

## 2012-11-10 ENCOUNTER — Ambulatory Visit (INDEPENDENT_AMBULATORY_CARE_PROVIDER_SITE_OTHER): Payer: Medicare Other | Admitting: Family Medicine

## 2012-11-10 ENCOUNTER — Encounter: Payer: Self-pay | Admitting: Family Medicine

## 2012-11-10 VITALS — BP 150/88 | HR 92 | Ht 62.0 in | Wt 136.5 lb

## 2012-11-10 DIAGNOSIS — E1165 Type 2 diabetes mellitus with hyperglycemia: Secondary | ICD-10-CM

## 2012-11-10 DIAGNOSIS — E118 Type 2 diabetes mellitus with unspecified complications: Secondary | ICD-10-CM

## 2012-11-10 DIAGNOSIS — I10 Essential (primary) hypertension: Secondary | ICD-10-CM

## 2012-11-10 DIAGNOSIS — IMO0002 Reserved for concepts with insufficient information to code with codable children: Secondary | ICD-10-CM

## 2012-11-10 DIAGNOSIS — IMO0001 Reserved for inherently not codable concepts without codable children: Secondary | ICD-10-CM

## 2012-11-10 DIAGNOSIS — Z9181 History of falling: Secondary | ICD-10-CM | POA: Insufficient documentation

## 2012-11-10 LAB — POCT GLYCOSYLATED HEMOGLOBIN (HGB A1C): Hemoglobin A1C: 11.5

## 2012-11-10 MED ORDER — GLUCOSE BLOOD VI STRP
ORAL_STRIP | Status: DC
Start: 2012-11-10 — End: 2014-03-16

## 2012-11-10 NOTE — Patient Instructions (Addendum)
It was good to see you today.  For your sugars, I would like them to be between 100-130. We can your A1c <7 or 8; yours was 11.5 today  Since you are having low sugars, decrease your lantus to 32 units twice a day.  If they are still too low and you feel funny, decrease to 30 units 2 times per day.  Leave your blood pressure pills the same.  Come back to see me in 3-4 weeks.  I want you to WRITE DOWN you sugars on a piece of paper and bring the list with you next time so we can look at them together.

## 2012-11-10 NOTE — Assessment & Plan Note (Signed)
BP goes up and down, will continue accupril 40, HCTZ 25.   BP Readings from Last 3 Encounters:  11/10/12 150/88  05/14/12 151/83  04/13/12 100/60

## 2012-11-10 NOTE — Assessment & Plan Note (Signed)
A1c has worsened, likely because pt only takes 35U BID sometimes and takes significantly less other times because of symptoms when CBGs <100.  Will decrease to 32U BID with instructions to go down to 30U BID if continues to have symptoms.  Pt is to f/u in 3-4 weeks.  May have pt see pharm clinic for assistance with this in the near future.  Foot exam done today.

## 2012-11-10 NOTE — Progress Notes (Signed)
S: Pt comes in today for follow up.  DIABETES Home CBGs: 200 this AM (~ usual); lowest- 87, highest- 400's Meds: met 1000 BID, lantus 35 BID  Taking Meds: yes # of doses missed per week: skips AM dose 3x/week, takes decreased dose 3x/week because has hypoglycemic episodes sometimes after taking 35 BID so skips the next morning dose and will take 10-20U for her evening dose Hypoglycemic episodes?: yes lowest was 87 but symptomatic  Last foot exam:  Today  Last eye exam: 09/2012 by Dr. Radford Pax (did cataract surgery) Symptoms: Polyuria: no Polydipsia: no Parasthesias: no   Dizziness: yes  Nausea:  no Vomiting:  no Last A1c:  Lab Results  Component Value Date   HGBA1C 11.5 11/10/2012   (last A1c 10.4 04/2012)   HYPERTENSION BP:  150/88 Meds: accupril 40, HCTZ 25 Taking meds: Yes     # of doses missed/week: 0 Symptoms: Headache: No Dizziness: No Vision changes: No SOB:  No Chest pain: No LE swelling: Yes : worse if sitting for long period Tobacco use: No   ROS: Per HPI  History  Smoking status  . Never Smoker   Smokeless tobacco  . Never Used    O:  Filed Vitals:   11/10/12 1402  BP: 150/88  Pulse: 92    Gen: NAD CV: RRR, no murmur Pulm: CTA bilat, no wheezes or crackles  DIABETIC FOOT EXAM: toe nails long bilateral feet, feet with dry skin but no ulcers or sores; sensation intact throughout both feet and bilateral shins    A/P: 67 y.o. female p/w DM, HTN -See problem list -f/u in 3-4 weeks

## 2012-12-01 ENCOUNTER — Ambulatory Visit: Payer: Medicare Other | Admitting: Family Medicine

## 2012-12-04 ENCOUNTER — Other Ambulatory Visit: Payer: Self-pay | Admitting: Family Medicine

## 2012-12-07 ENCOUNTER — Encounter: Payer: Self-pay | Admitting: *Deleted

## 2012-12-08 ENCOUNTER — Other Ambulatory Visit: Payer: Self-pay | Admitting: Family Medicine

## 2012-12-16 ENCOUNTER — Ambulatory Visit (INDEPENDENT_AMBULATORY_CARE_PROVIDER_SITE_OTHER): Payer: Medicare Other | Admitting: Family Medicine

## 2012-12-16 VITALS — BP 122/72 | HR 91 | Ht 62.0 in | Wt 130.0 lb

## 2012-12-16 DIAGNOSIS — IMO0002 Reserved for concepts with insufficient information to code with codable children: Secondary | ICD-10-CM

## 2012-12-16 DIAGNOSIS — I10 Essential (primary) hypertension: Secondary | ICD-10-CM

## 2012-12-16 DIAGNOSIS — E1165 Type 2 diabetes mellitus with hyperglycemia: Secondary | ICD-10-CM

## 2012-12-16 DIAGNOSIS — IMO0001 Reserved for inherently not codable concepts without codable children: Secondary | ICD-10-CM

## 2012-12-16 MED ORDER — FLUTICASONE PROPIONATE 50 MCG/ACT NA SUSP: 1.0000 | Freq: Every day | NASAL | Status: DC

## 2012-12-16 NOTE — Patient Instructions (Addendum)
It was good to see you today.  Your blood pressure looks good.  I gave you the letter you need for [place].  Great job on checking your sugars and taking your lantus every day! I still think we can do better with your sugar control.  I think you would benefit from meeting with Dr. Valentina Lucks, our pharmacist.  Please make an appointment for [place] in 3-4 weeks.  Bring your list of sugars-- try to write down the time of day you check them so help [place].

## 2012-12-16 NOTE — Assessment & Plan Note (Signed)
Improved compliance, but still with some lower-ish readings (has 5-6 <80) so will not increase lantus.  Probably needs mealtime insulin.  Patient needs a great deal of education, so will send to pharm clinic.  CBG list does not have times written, so I am not able to see if there is consistently a meal where she is in the 200-300 range.  Will wait to make changes.

## 2012-12-16 NOTE — Progress Notes (Signed)
S: Pt comes in today for follow up.  HYPERTENSION BP: 122/72 Meds: accupril 40, HCTZ 25 Taking meds: Yes     # of doses missed/week: 0 Symptoms: Headache: No Dizziness: No Vision changes: No SOB:  No Chest pain: No LE swelling: No Tobacco use: No   DIABETES Home CBGs: only has a list, no time of day, but range from 63- 566 (most 130-300) Meds: lantus 30 BID(decreased 3/25 b/c hypoglycemic episodes leading to noncompliance), met 1000 BID Taking Meds: yes # of doses missed per week: 0 -- even if sugar is a little low, will eat and then still take lantus rather than just skipping insulin  Hypoglycemic episodes?: yes : better with eating Symptoms: Polyuria: no Polydipsia: no Parasthesias: no   Dizziness: no  Nausea:  no Vomiting:  no Last A1c:  Lab Results  Component Value Date   HGBA1C 11.5 11/10/2012     ROS: Per HPI  History  Smoking status  . Never Smoker   Smokeless tobacco  . Never Used    O:  Filed Vitals:   12/16/12 1047  BP: 122/72  Pulse: 91    Gen: NAD CV: RRR, no murmur Pulm: CTA bilat, no wheezes or crackles Ext: Warm, no edema   A/P: 67 y.o. female p/w HTN, DM -See problem list -f/u in 3-4 weeks with pharm clinic

## 2012-12-16 NOTE — Assessment & Plan Note (Signed)
Well controlled, continue accupril 40, HCTZ 25.  Last BMET 03/2012.

## 2012-12-19 ENCOUNTER — Other Ambulatory Visit: Payer: Self-pay | Admitting: Family Medicine

## 2012-12-22 ENCOUNTER — Other Ambulatory Visit: Payer: Self-pay | Admitting: Family Medicine

## 2012-12-30 ENCOUNTER — Encounter: Payer: Self-pay | Admitting: Home Health Services

## 2013-01-07 ENCOUNTER — Ambulatory Visit: Payer: Medicare Other | Admitting: Pharmacist

## 2013-02-01 ENCOUNTER — Ambulatory Visit (INDEPENDENT_AMBULATORY_CARE_PROVIDER_SITE_OTHER): Payer: Medicare Other | Admitting: Pharmacist

## 2013-02-01 ENCOUNTER — Encounter: Payer: Self-pay | Admitting: Pharmacist

## 2013-02-01 VITALS — BP 131/62 | HR 83 | Ht 62.0 in | Wt 126.0 lb

## 2013-02-01 DIAGNOSIS — E1165 Type 2 diabetes mellitus with hyperglycemia: Secondary | ICD-10-CM

## 2013-02-01 DIAGNOSIS — IMO0002 Reserved for concepts with insufficient information to code with codable children: Secondary | ICD-10-CM

## 2013-02-01 DIAGNOSIS — IMO0001 Reserved for inherently not codable concepts without codable children: Secondary | ICD-10-CM

## 2013-02-01 MED ORDER — INSULIN ASPART 100 UNIT/ML FLEXPEN: 10.0000 [IU] | PEN_INJECTOR | Freq: Three times a day (TID) | SUBCUTANEOUS | Status: DC

## 2013-02-01 MED ORDER — INSULIN LISPRO 100 UNIT/ML (KWIKPEN): 10.0000 [IU] | PEN_INJECTOR | Freq: Three times a day (TID) | SUBCUTANEOUS | Status: DC

## 2013-02-01 NOTE — Assessment & Plan Note (Addendum)
  Diabetes of many yrs duration currently under poor control of blood glucose based on   Lab Results  Component Value Date   HGBA1C 11.5 11/10/2012    ,home fasting CBG readings of 87-427. Control is suboptimal due to diet and lack of adequate dosing with insulin. Denies hypoglycemic events but she does verbalize days that her blood sugar is lower due to not eating as much and still taking Lantus.  Able to verbalize appropriate hypoglycemia management plan. Decreased dose of basal insulin Lantus (insulin glargine) to 25 units BID. Gave samples of rapid insulin Novolog (insulin aspart) for 10 units before each meal.  Will need to switch to Humalog with medication fill at the pharmacy.   Written patient instructions provided. Counseled patient that the Novolog will not cause her to have more hypoglycemic events  Follow up in  Pharmacist Clinic Visit in 2 weeks.   Total time in face to face counseling 30 minutes.  Patient seen with Verna Czech, PharmD Resident

## 2013-02-01 NOTE — Progress Notes (Signed)
  Subjective:    Patient ID: [name], female    DOB: 1945-10-12, 67 y.o.   MRN: [id number]  HPI  Arrives to the clinic in good spirits alone today. Checked her blood sugar monitor and her sugars range from 87-427. Mentions that she gets nervous about taking her insulin when her sugar drops too low and after eating to correct, it can go too high. Mentions that she has been working with someone on her diet (not Dr. Jenne Campus) and feels that she is making progress, and thinks her blood sugars are improving but could still use work. States that she knows the food provided at the center isn't the best and is working on portion control. Goes to the [place] to do exercise about 2 times a week.  Review of Systems     Objective:   Physical Exam     Assessment & Plan:   Diabetes of many yrs duration currently under poor control of blood glucose based on   Lab Results  Component Value Date   HGBA1C 11.5 11/10/2012    ,home fasting CBG readings of 87-427. Control is suboptimal due to diet and lack of adequate dosing with insulin. Denies hypoglycemic events but she does verbalize days that her blood sugar is lower due to not eating as much and still taking Lantus.  Able to verbalize appropriate hypoglycemia management plan. Decreased dose of basal insulin Lantus (insulin glargine) to 25 units BID. Gave samples of rapid insulin Novolog (insulin aspart) for 10 units before each meal.  Written patient instructions provided. Counseled patient that the Novolog will not cause her to have more hypoglycemic events  Follow up in  Pharmacist Clinic Visit in 2 weeks.   Total time in face to face counseling 30 minutes.  Patient seen with Verna Czech, PharmD Resident

## 2013-02-01 NOTE — Patient Instructions (Addendum)
Decrease Lantus to 25 units twice a day  Start taking Novolog pen 10 units with each meal  Continue to keep track of your blood sugars and bring them to your next visit so we can adjust your insulin if needed.  Keep working on your diet and exercise and let us know about your progress. Make sure to drink more water rather than soda.  Follow up with Dr. Valentina Lucks in early July (in about 2 weeks)  Call Dr. Loraine Maple if you have any questions before your next appointment.

## 2013-02-09 NOTE — Progress Notes (Signed)
Patient ID: [name], female   DOB: October 29, 1945, 67 y.o.   MRN: [id number] Reviewed: Agree with Dr. Graylin Shiver documentation and management.

## 2013-02-25 ENCOUNTER — Other Ambulatory Visit: Payer: Self-pay

## 2013-03-02 ENCOUNTER — Ambulatory Visit (INDEPENDENT_AMBULATORY_CARE_PROVIDER_SITE_OTHER): Payer: Self-pay | Admitting: Pharmacist

## 2013-03-02 ENCOUNTER — Encounter: Payer: Self-pay | Admitting: Pharmacist

## 2013-03-02 VITALS — Ht 63.0 in | Wt 133.3 lb

## 2013-03-02 DIAGNOSIS — E1149 Type 2 diabetes mellitus with other diabetic neurological complication: Secondary | ICD-10-CM

## 2013-03-02 NOTE — Assessment & Plan Note (Signed)
Diabetes of many yrs duration currently under fair control of blood glucose based on  . Lab Results  Component Value Date   HGBA1C 11.5 11/10/2012    However home fasting CBG readings are reported to by much better with may readings [phone].  Control is suboptimal due to anxiety over her grandchildren. Denies hypoglycemic events.  Able to verbalize appropriate hypoglycemia management plan. Continued basal insulin Lantus (insulin glargine) and continued HUMALOG at same dose.  Written patient instructions provided.  Follow up in  Pharmacist Clinic Visit if next A1C remains higher than goal.    Total time in face to face counseling 30 minutes.  Patient seen with Lenor Coffin, Med Student - 3.

## 2013-03-02 NOTE — Patient Instructions (Addendum)
Please remember to bring your blood glucose meter to your next visit.   Make appointment for visit to meet your new doctor, Dr. [name] in the next 2-3 weeks.   Please try to keep a positive attitude to work through your challenges.

## 2013-03-02 NOTE — Progress Notes (Signed)
S:   Patient arrives in fair to good spirits walking with the assistance of a cane.   She states she is "a bit anxious" and has been eating more lately.   She is accepting of her current weight.   After some conversation about her diabets she shared that she is very concerned for the health and safety of her grandchildren.   She is anxious about them and is very conflicted about confronting the custodial parent.   The patient's daughter died and left the children in his care.   She is worried that child support money is NOT helping the children.  She is willing to establish with her care provider for more assistance.   Patient reports adhering to treatment plan as prescribed at previous visit.   Denies hypoglycemic episodes.   A/P:   Diabetes of many yrs duration currently under fair control of blood glucose based on  . Lab Results  Component Value Date   HGBA1C 11.5 11/10/2012    However home fasting CBG readings are reported to by much better with may readings [phone].  Control is suboptimal due to anxiety over her grandchildren. Denies hypoglycemic events.  Able to verbalize appropriate hypoglycemia management plan. Continued basal insulin Lantus (insulin glargine) and continued HUMALOG at same dose.  Written patient instructions provided.  Follow up in  Pharmacist Clinic Visit if next A1C remains higher than goal.    Total time in face to face counseling 30 minutes.  Patient seen with Lenor Coffin, Med Student - 3.

## 2013-03-03 NOTE — Progress Notes (Signed)
Patient ID: [name], female   DOB: [birth year], 67 y.o.   MRN: [id number] Reviewed: Agree with Dr. Graylin Shiver documentation and management.

## 2013-03-18 ENCOUNTER — Ambulatory Visit (INDEPENDENT_AMBULATORY_CARE_PROVIDER_SITE_OTHER): Payer: Medicare Other | Admitting: Family Medicine

## 2013-03-18 ENCOUNTER — Encounter: Payer: Self-pay | Admitting: Family Medicine

## 2013-03-18 VITALS — BP 125/64 | HR 77 | Ht 62.0 in | Wt 132.0 lb

## 2013-03-18 DIAGNOSIS — IMO0001 Reserved for inherently not codable concepts without codable children: Secondary | ICD-10-CM

## 2013-03-18 DIAGNOSIS — IMO0002 Reserved for concepts with insufficient information to code with codable children: Secondary | ICD-10-CM

## 2013-03-18 DIAGNOSIS — I10 Essential (primary) hypertension: Secondary | ICD-10-CM

## 2013-03-18 DIAGNOSIS — E1165 Type 2 diabetes mellitus with hyperglycemia: Secondary | ICD-10-CM

## 2013-03-18 MED ORDER — POLYETHYLENE GLYCOL 3350 17 GM/SCOOP PO POWD: 17.0000 g | Freq: Every day | ORAL | Status: DC | PRN

## 2013-03-18 NOTE — Progress Notes (Addendum)
Patient ID: [name], female   DOB: 09-08-1945, 67 y.o.   MRN: [id number]   HPI:  Diabetes: checks sugars in the morning and gets from 119-214. Generally doesn't get much lower than that. She is currently taking Lantus 25units BID and mealtime insulin lispro 10 units with meals. Just added the mealtime insulin about one month ago. Has been followed in pharmacy clinic. Gets dizzy/lightheaded only when doesn't eat. No passing out. Denies chest pain, SOB, leg edema.  Constipation: has frequent hard stooling. Has been eating extra but has had minimal stooling lately. Watermelon makes her bowels loose, so she eats watermelon to help. Has had some breathing discomfort which she attributes to having a full stomach, as it improves after she uses the bathroom. Used to have lactose intolerance. Has not had a bowel movement in 2 weeks. Was supposed to get a colonoscopy last year, but she never scheduled the visit.  ROS: See HPI  [employer]: hx T2DM, HTN  PHYSICAL EXAM: BP 125/64  Pulse 77  Ht 5\' 2"  (1.575 m)  Wt 132 lb (59.875 kg)  BMI 24.14 kg/m2 Gen: NAD Heart: RRR Lungs: CTAB Abd: mildly tender to palpation in epigastric area. No masses or organomegaly. Soft, nondistended. Neuro: grossly nonfocal, speech intact, pleasant affect  ASSESSMENT/PLAN:  # Constipation: -not currently on a bowel regimen. Will start miralax to see if it helps. As pt is due for a colonoscopy and has had recent changes in stool habits, I have instructed her to call her GI doctor to schedule an appointment to re-address getting a colonoscopy.  # Health maintenance: -see above regarding colonoscopy -given handout on how to schedule mammogram today

## 2013-03-18 NOTE — Patient Instructions (Addendum)
It was nice to meet you today!  Call the gastroenterologist [phone]) to schedule an appointment with Dr. Henrene Pastor to talk about getting a colonoscopy and the symptoms you've been having.  I sent in a prescription for miralax. Let's see if it helps your stools.  Keep your insulin the same. Come back in 2 months and we'll recheck the A1c to see if we need to make changes. Keep checking your blood sugar every morning before you eat.  See the handout on scheduling a mammogram.  Be well, Dr. [name]

## 2013-03-20 ENCOUNTER — Other Ambulatory Visit: Payer: Self-pay | Admitting: Family Medicine

## 2013-03-21 NOTE — Assessment & Plan Note (Signed)
BP well controlled today. Continue current management.

## 2013-03-21 NOTE — Assessment & Plan Note (Addendum)
A1c remains elevated today, although improved from when last checked. Patient just began adding mealtime insulin coverage about one month ago, so we aren't truly seeing the full effects of her new regimen in her HgbA1c. The majority of numbers she reports getting are less than 200, so I am hesitant to adjust her insulin regimen right now. Will have her continue her current regimen for now, and follow up in 2 months for another A1c, at which time we will know how much better controlled she is.   Cardiac: on aspirin, needs lipids at future visit Renal: on ACE, needs BMET at next visit Eye: does have hx of retinopathy, had eye exam in February per health maintenance flowsheet Foot: need to perform at next visit

## 2013-05-03 ENCOUNTER — Other Ambulatory Visit: Payer: Self-pay | Admitting: Family Medicine

## 2013-05-06 ENCOUNTER — Other Ambulatory Visit: Payer: Self-pay | Admitting: Family Medicine

## 2013-05-06 MED ORDER — ACCU-CHEK SOFTCLIX LANCETS MISC: Status: DC

## 2013-05-18 ENCOUNTER — Ambulatory Visit: Payer: Medicare Other | Admitting: Family Medicine

## 2013-05-23 ENCOUNTER — Other Ambulatory Visit: Payer: Self-pay | Admitting: Family Medicine

## 2013-05-24 ENCOUNTER — Encounter: Payer: Self-pay | Admitting: Family Medicine

## 2013-06-16 ENCOUNTER — Ambulatory Visit: Payer: Medicare Other | Admitting: Family Medicine

## 2013-07-24 ENCOUNTER — Other Ambulatory Visit: Payer: Self-pay | Admitting: Family Medicine

## 2013-08-18 ENCOUNTER — Ambulatory Visit (INDEPENDENT_AMBULATORY_CARE_PROVIDER_SITE_OTHER): Payer: Medicare Other | Admitting: Family Medicine

## 2013-08-18 ENCOUNTER — Encounter: Payer: Self-pay | Admitting: Family Medicine

## 2013-08-18 VITALS — BP 156/81 | HR 84 | Temp 98.1°F | Ht 62.0 in | Wt 137.0 lb

## 2013-08-18 DIAGNOSIS — E119 Type 2 diabetes mellitus without complications: Secondary | ICD-10-CM

## 2013-08-18 DIAGNOSIS — I10 Essential (primary) hypertension: Secondary | ICD-10-CM

## 2013-08-18 DIAGNOSIS — E890 Postprocedural hypothyroidism: Secondary | ICD-10-CM

## 2013-08-18 DIAGNOSIS — IMO0001 Reserved for inherently not codable concepts without codable children: Secondary | ICD-10-CM

## 2013-08-18 DIAGNOSIS — E039 Hypothyroidism, unspecified: Secondary | ICD-10-CM

## 2013-08-18 DIAGNOSIS — E1165 Type 2 diabetes mellitus with hyperglycemia: Secondary | ICD-10-CM

## 2013-08-18 DIAGNOSIS — IMO0002 Reserved for concepts with insufficient information to code with codable children: Secondary | ICD-10-CM

## 2013-08-18 DIAGNOSIS — J189 Pneumonia, unspecified organism: Secondary | ICD-10-CM

## 2013-08-18 LAB — COMPREHENSIVE METABOLIC PANEL
ALT: <8 U/L (ref 0–35)
AST: 11 U/L (ref 0–37)
Albumin: 2.9 g/dL — ABNORMAL LOW (ref 3.5–5.2)
Alkaline Phosphatase: 96 U/L (ref 39–117)
BUN: 15 mg/dL (ref 6–23)
Chloride: 95 mEq/L — ABNORMAL LOW (ref 96–112)
Glucose, Bld: 370 mg/dL — ABNORMAL HIGH (ref 70–99)
Sodium: 127 mEq/L — ABNORMAL LOW (ref 135–145)
Total Bilirubin: 0.4 mg/dL (ref 0.3–1.2)
Total Protein: 8.4 g/dL — ABNORMAL HIGH (ref 6.0–8.3)

## 2013-08-18 LAB — LIPID PANEL
Cholesterol: 121 mg/dL (ref 0–200)
LDL Cholesterol: 71 mg/dL (ref 0–99)
Total CHOL/HDL Ratio: 3.4 Ratio
Triglycerides: 72 mg/dL (ref <150)
VLDL: 14 mg/dL (ref 0–40)

## 2013-08-18 LAB — TSH: TSH: 9.245 u[IU]/mL — ABNORMAL HIGH (ref 0.350–4.500)

## 2013-08-18 MED ORDER — METFORMIN HCL 1000 MG PO TABS: ORAL_TABLET | ORAL | Status: DC

## 2013-08-18 MED ORDER — QUINAPRIL HCL 40 MG PO TABS: ORAL_TABLET | ORAL | Status: DC

## 2013-08-18 MED ORDER — AZITHROMYCIN 250 MG PO TABS: ORAL_TABLET | ORAL | Status: DC

## 2013-08-18 MED ORDER — INSULIN GLARGINE 100 UNIT/ML SOLOSTAR PEN: 15.0000 [IU] | PEN_INJECTOR | Freq: Every day | SUBCUTANEOUS | Status: DC

## 2013-08-18 MED ORDER — ASPIRIN EC 325 MG PO TBEC: DELAYED_RELEASE_TABLET | ORAL | Status: DC

## 2013-08-18 MED ORDER — HYDROCHLOROTHIAZIDE 25 MG PO TABS: ORAL_TABLET | ORAL | Status: DC

## 2013-08-18 MED ORDER — ALBUTEROL SULFATE HFA 108 (90 BASE) MCG/ACT IN AERS: 2.0000 | INHALATION_SPRAY | Freq: Four times a day (QID) | RESPIRATORY_TRACT | Status: DC | PRN

## 2013-08-18 MED ORDER — FLUTICASONE PROPIONATE 50 MCG/ACT NA SUSP: 1.0000 | Freq: Every day | NASAL | Status: DC

## 2013-08-18 NOTE — Patient Instructions (Addendum)
It was great to see you again today!  For diabetes: start Lantus 10 units daily. Check your blood sugar each morning and several times during the day. Come back in 3 weeks to follow up.  For blood pressure: restart your BP medicines. Come back in 3 weeks.  For cough/cold: I sent in an antibiotic for you to take. If not better in a few days please return to be seen again. I also sent in an inhaler and your nose spray.  I refilled all your medicines. I will call you if your test results are not normal.  Otherwise, I will send you a letter.  If you do not hear from me with in 2 weeks please call our office.      Be well, Dr. [name]

## 2013-08-18 NOTE — Progress Notes (Signed)
Patient ID: [name], female   DOB: Dec 11, 1945, 67 y.o.   MRN: [id number]  HPI:  Diabetes: currently only taking humalog 4-6 units with meals. Has been out of her lantus for several months, states the pharmacy just did not fill it or many of her other medicines. Also out of metformin. Sugars have been "kind of high" - ie, up in the 300's. The lowest she gets is about 107 at midday, this happened a few weeks ago. She is unsure of when her last eye exam was but thinks she went in August or September of this year. When she used to take Lantus she did not have any problems with hypoglycemia. She is willing to check her blood sugar more regularly.  HTN: she does not check her blood pressure at home. Supposed to be taking HCTZ and lisinopril but is not currently taking either of these medicines.   Hypothyroidism: currently taking synthroid 222mcg daily (has this medicine even though she is out of her other medicines). Due for a TSH check today.  URI sx's: on Saturday began feeling sick. Has been blowing her nose, coughing a lot. Has not had any sick contacts. Normally takes flonase and albuterol as needed for this kind of thing but she is out of both of these medicines. The cough has been nonproductive. Thinks she is overall getting better.  ROS: See HPI. Denies dizziness, ligthheadedness, cp, sob, swelling, headaches, vision changes. Other than her current URI sx's she is feeling well.  [place]: hx DM, HTN, hypothyroidism  PHYSICAL EXAM: BP 156/81  Pulse 84  Temp(Src) 98.1 F (36.7 C) (Oral)  Ht 5\' 2"  (1.575 m)  Wt 137 lb (62.143 kg)  BMI 25.05 kg/m2 Gen: NAD HEENT: NCAT, PERRL, no oropharyngeal exudates, no cervical lymphadenopathy Heart: RRR Lungs: NWOB, crackles present in LLL but otherwise CTAB Neuro: grossly nonfocal, speech intact Ext: No appreciable lower extremity edema bilaterally   ASSESSMENT/PLAN:  # Cough/congestion: clinically well appearing, but has focal crackles on exam.  Will treat for CAP with azithromycin 500mg  daily x1 day then 250mg  daily x4 days. Will also refill albuterol and flonase. F/u if not improving in a few days. Precepted with Dr. Andria Frames who also examined patient and agrees with this plan.   Note: had frank discussion with pt today on the importance of her following up regularly in order to get her DM &HTN under control. Was last seen in in July and instructed to f/u in 2 months. Also reminded her that she can always call our office to request a refill if the pharmacy does not fill it for some reason.  See problem based charting for additional assessment/plan.  FOLLOW UP: F/u in 3 weeks for BP check and further titration of lantus. Needs foot exam at that visit.  [place]. [name], [place]

## 2013-08-22 NOTE — Assessment & Plan Note (Signed)
BP elevated but pt has been out of her medicines. Will refill and have her f/u in 3 weeks to recheck BP. Check CMET today.

## 2013-08-22 NOTE — Assessment & Plan Note (Signed)
Check TSH today and titrate synthroid as needed.

## 2013-08-22 NOTE — Assessment & Plan Note (Addendum)
Anticipate A1c will be poorly controlled since pt has not been taking lantus or metformin, just a small amount of novolog with meals. Will refill her medicines. Will start lantus at 10 units daily, which is lower than her previous dose, given that she reports readings in the low 100's around midday. F/u in 3 weeks with CBG readings for further titration of lantus.  Cardiac: on aspirin, check lipids today (not on statin) Renal: restarting ACE today, has been out of it Eye: pt reports last had eye exam around Sept 2014 Foot: defer to next visit given all the other issues we addressed today

## 2013-08-23 ENCOUNTER — Telehealth: Payer: Self-pay | Admitting: Family Medicine

## 2013-08-23 MED ORDER — ROSUVASTATIN CALCIUM 20 MG PO TABS: 20.0000 mg | ORAL_TABLET | Freq: Every day | ORAL | Status: DC

## 2013-08-23 MED ORDER — LEVOTHYROXINE SODIUM 125 MCG PO TABS: 250.0000 ug | ORAL_TABLET | Freq: Every day | ORAL | Status: DC

## 2013-08-23 NOTE — Telephone Encounter (Signed)
Reached patient. Discussed need for statin and increase of synthroid. Will inc synthroid to 258mcg daily and start crestor 20mg  daily for primary prevention. Pt has f/u appt scheduled with me for 1/21. Encouraged her to keep this appt.  [name], MD

## 2013-08-23 NOTE — Telephone Encounter (Signed)
Attempted to reach patient regarding labwork. LVM for her to return call. I would like to start her on a cholesterol medicine and likely need to increase the dose of her thyroid medicine, but would like to speak with her regarding these medicines. Will attempt to call back later. If she returns the call please take down the # at which I can reach her.

## 2013-08-23 NOTE — Telephone Encounter (Signed)
Pt is returning Dr. Lennie Odor call. She will wait for the doctor's call at [phone]. jw

## 2013-08-23 NOTE — Telephone Encounter (Signed)
Returned call to pt, no answer, left another VM asking her to call us back. When she returns the call please page me at [phone]. [name], MD

## 2013-09-08 ENCOUNTER — Ambulatory Visit
Admission: RE | Admit: 2013-09-08 | Discharge: 2013-09-08 | Disposition: A | Discharge status: Home / Self Care | Payer: Medicare Other | Source: Ambulatory Visit | Attending: Family Medicine | Admitting: Family Medicine

## 2013-09-08 ENCOUNTER — Encounter: Payer: Self-pay | Admitting: Family Medicine

## 2013-09-08 ENCOUNTER — Telehealth: Payer: Self-pay | Admitting: Family Medicine

## 2013-09-08 ENCOUNTER — Ambulatory Visit (INDEPENDENT_AMBULATORY_CARE_PROVIDER_SITE_OTHER): Payer: Medicare Other | Admitting: Family Medicine

## 2013-09-08 VITALS — BP 162/82 | HR 97 | Temp 97.8°F | Ht 62.0 in | Wt 130.0 lb

## 2013-09-08 DIAGNOSIS — R059 Cough, unspecified: Secondary | ICD-10-CM

## 2013-09-08 DIAGNOSIS — R05 Cough: Secondary | ICD-10-CM

## 2013-09-08 DIAGNOSIS — E1165 Type 2 diabetes mellitus with hyperglycemia: Secondary | ICD-10-CM

## 2013-09-08 DIAGNOSIS — IMO0002 Reserved for concepts with insufficient information to code with codable children: Secondary | ICD-10-CM

## 2013-09-08 DIAGNOSIS — I10 Essential (primary) hypertension: Secondary | ICD-10-CM

## 2013-09-08 DIAGNOSIS — IMO0001 Reserved for inherently not codable concepts without codable children: Secondary | ICD-10-CM

## 2013-09-08 MED ORDER — INSULIN GLARGINE 100 UNIT/ML SOLOSTAR PEN: 20.0000 [IU] | PEN_INJECTOR | Freq: Every day | SUBCUTANEOUS | Status: DC

## 2013-09-08 MED ORDER — AMLODIPINE BESYLATE 10 MG PO TABS: 10.0000 mg | ORAL_TABLET | Freq: Every day | ORAL | Status: DC

## 2013-09-08 MED ORDER — CETIRIZINE HCL 10 MG PO TABS: 10.0000 mg | ORAL_TABLET | Freq: Every day | ORAL | Status: DC

## 2013-09-08 MED ORDER — LEVOFLOXACIN 500 MG PO TABS: 500.0000 mg | ORAL_TABLET | Freq: Every day | ORAL | Status: DC

## 2013-09-08 NOTE — Patient Instructions (Addendum)
It was great to see you again today!  Increase your Lantus to 20 units every morning. Keep checking your sugars. Watch out for signs of low blood sugar and call us if you have any problems.   I sent in a new blood pressure medicine for you, amlodipine. Take it once per day.  I ordered a chest x ray. Please go get this done today. We will call you with the results or send you a letter.  Return in 2-3 weeks to follow up on your diabetes and hypertension.  Be well, Dr. [name]

## 2013-09-08 NOTE — Telephone Encounter (Signed)
Attempted to call patient to relay results of CXR, which show LLL pneumonia. No answer, so I left voicemail asking her to call us back. Will send in antibiotic (levaquin 500 mg PO daily x 7 days). When patient returns call please relay this message to her and copy me on the note so I know that she got it.  [name], MD

## 2013-09-08 NOTE — Progress Notes (Signed)
Patient ID: [name], female   DOB: 1946/03/28, 68 y.o.   MRN: [id number]  HPI:  Cough: previously was seen several weeks ago in clinic and prescribed azithromycin for possible community acquired pneumonia. She took the azithromycin but had some issues when she finished it with feeling dizzy and tired for one week. She just laid around and didn't want to get up. She wonders if this was actually a fibromyalgia flare. Those symptoms have now improved and she is feeling well, except she still has a cough. Her nose also runs a lot. She has not had a fever. Has markedly increased appetite. Has felt short of breath off and on, and has been using flonase and albuterol. Using albuterol about tiwce per day. She does not smoke cigarettes but does have a kerosene heater. Has never smoked in the past.   Diabetes: currently taking Lantus 15 units daily and 10 units of novolog 3 times daily with meals. Also takes metformin 1000mg  once per day (not twice per day as prescribed). Has been checking her sugars and has brought in chart. Morning fasting #'s: 109, 247, 190, 157, 220, 170 Before lunch: 225, 170, 270, 320 Before dinner: 320, 167, 310, 176  HTN: Currently taking quinapril 40mg  daily and HCTZ 25mg  daily. Does not check BP at home. Did take BP medicines today. Denies having any chest pain.  ROS: See HPI  [place]: hx uncontrolled DM, hypothyroidism, HTN, rheumatoid arthritis  PHYSICAL EXAM: BP 162/88  Pulse 97  Temp(Src) 97.8 F (36.6 C) (Oral)  Ht 5\' 2"  (1.575 m)  Wt 130 lb (58.968 kg)  BMI 23.77 kg/m2 Gen: NAD, pleasant, cooperative HEENT: NCAT, MMM Heart: RRR Lungs: normal respiratory effort, occasional cough, no focal crackles appreciated today but possibly decreased aeration at bilateral bases Neuro: grossly nonfocal, speech intact Ext: No appreciable lower extremity edema bilaterally, monofilament exam performed today. 2+ DP pulses bilaterally. Does have somewhat diminished sensation over  plantar aspect of toes bilaterally.   ASSESSMENT/PLAN:  # Health maintenance:  -needs physical to address health maintenance items. Will plan to discuss need for physical when she follows up in 2-3 weeks.  # Persistent Cough: still has cough and SOB even after tx with azithromycin. Requiring albuterol twice daily to control sx's. Will check CXR to evaluate for pneumonia or other lung abnormality. If CXR negative will consider 5 day steroid burst. Precepted with Dr. Lindell Noe who also examined patient and agrees with this plan.   See problem based charting for additional assessment/plan.  FOLLOW UP: F/u in 2-3 weeks for diabetes and blood pressure (need to remind pt to schedule physical at that visit).  [place]. [name], [place]

## 2013-09-09 NOTE — Telephone Encounter (Signed)
Ms Cromley called back and I read her the message. She will go get her antibotic

## 2013-09-09 NOTE — Telephone Encounter (Signed)
Noted.  [name], CMA

## 2013-09-12 NOTE — Assessment & Plan Note (Addendum)
Given majority of fasting sugars are above 150, will increase lantus to 20 units daily. F/u in 2-3 weeks for continued titration of insulin.  Cardiac: on aspirin, statin Renal: on ACE Eye: last eye exam Sept 2014 Foot: exam done today, decreased sensation over bilateral plantar surfaces, recommended to patient that she look at her feet each day to evaluate for sores and/or infection.

## 2013-09-12 NOTE — Assessment & Plan Note (Signed)
BP remains elevated despite reported compliance with quinapril and HCTZ. Will add norvasc 10mg  daily. F/u in 2-3 weeks to monitor for improvement in BP.

## 2013-09-24 ENCOUNTER — Encounter: Payer: Self-pay | Admitting: Family Medicine

## 2013-09-24 ENCOUNTER — Ambulatory Visit (INDEPENDENT_AMBULATORY_CARE_PROVIDER_SITE_OTHER): Payer: Medicare Other | Admitting: Family Medicine

## 2013-09-24 VITALS — BP 147/83 | HR 96 | Temp 98.3°F | Ht 62.0 in | Wt 136.0 lb

## 2013-09-24 DIAGNOSIS — IMO0002 Reserved for concepts with insufficient information to code with codable children: Secondary | ICD-10-CM

## 2013-09-24 DIAGNOSIS — I10 Essential (primary) hypertension: Secondary | ICD-10-CM

## 2013-09-24 DIAGNOSIS — IMO0001 Reserved for inherently not codable concepts without codable children: Secondary | ICD-10-CM

## 2013-09-24 DIAGNOSIS — E1165 Type 2 diabetes mellitus with hyperglycemia: Secondary | ICD-10-CM

## 2013-09-24 MED ORDER — INSULIN LISPRO 100 UNIT/ML (KWIKPEN): 15.0000 [IU] | PEN_INJECTOR | Freq: Three times a day (TID) | SUBCUTANEOUS | Status: DC

## 2013-09-24 MED ORDER — METFORMIN HCL 500 MG PO TABS: 1000.0000 mg | ORAL_TABLET | Freq: Two times a day (BID) | ORAL | Status: DC

## 2013-09-24 NOTE — Progress Notes (Signed)
Patient ID: [name], female   DOB: 02-23-1946, 68 y.o.   MRN: [id number]  HPI:  DM: here for titration of insulin. Currently doing Lantus 15 units every morning, with 15 units of humalog at each meal. She feels well, is not dizzy or lightheaded. Her recent CBG readings are: PM: 289, 320, 103, 365, 294, 228, 282 AM: 161, 180, 114  HTN: Currently taking quinapril 40mg  daily and HCTZ 25mg  daily, also amlodipine 10mg  daily which we added at her most recent visit. Does not check her BP at home. Denies chest pain, SOB, or swelling in her legs. Tolerating the amlodipine well. She thinks her blood pressure is high today because she has been very stressed out lately because of a niece that is very sick.  ROS: See HPI  [place]: recently treated for CAP, states she has been feeling better since taking the levaquin  PHYSICAL EXAM: BP 147/83  Pulse 96  Temp(Src) 98.3 F (36.8 C) (Oral)  Ht 5\' 2"  (1.575 m)  Wt 136 lb (61.689 kg)  BMI 24.87 kg/m2 Gen: NAD HEENT: NCAT Heart: RRR, no murmurs Lungs: CTAB, NWOB, no crackles or wheezes appreciable Neuro: grossly nonfocal, speech intact Ext: No appreciable lower extremity edema bilaterally   ASSESSMENT/PLAN:  See problem based charting for assessment/plan.  FOLLOW UP: F/u in 3 weeks for recheck BP and continued titration of insulin.  [place]. [name], [name]

## 2013-09-24 NOTE — Patient Instructions (Signed)
Increase your Lantus to 20 units every morning. Keep doing the novolog 15 units with meals. I sent in a new prescription for metformin with smaller pills. If you are able to get a BP monitor at home that will be helpful. Return in 3-4 weeks to follow up on diabetes and blood pressure.  [name], MD

## 2013-09-25 NOTE — Assessment & Plan Note (Addendum)
BP improved today compared to previous, but is still above goal (<140/90). Pt under a lot of stress due to family illness. Will have her return in 3 weeks and recheck BP at that visit. If still remains elevated at that time, will consider addition of beta blocker for better hypertensive control. Also recommended pt get BP monitor to check her BP at home.

## 2013-09-25 NOTE — Assessment & Plan Note (Signed)
At last visit had asked pt to increase Lantus to 20 units daily, which she hasn't done. She is still taking 15 units of Lantus, but has increased her mealtime coverage to 15 units with meals. As she has no fastings below 100, will increase lantus to 20 and continue to monitor her sugars. F/u in 3 weeks for further titration of insulin.

## 2013-10-21 ENCOUNTER — Ambulatory Visit: Payer: Medicare Other | Admitting: Family Medicine

## 2013-10-23 ENCOUNTER — Other Ambulatory Visit: Payer: Self-pay | Admitting: Family Medicine

## 2013-10-28 ENCOUNTER — Ambulatory Visit: Payer: Medicare Other | Admitting: Family Medicine

## 2013-10-28 ENCOUNTER — Other Ambulatory Visit: Payer: Self-pay | Admitting: Family Medicine

## 2013-10-29 ENCOUNTER — Telehealth: Payer: Self-pay

## 2013-10-29 NOTE — Telephone Encounter (Signed)
LVM for pt to call back to inquire as to whether she has had a flu shot and if she would like to schedule one. ms

## 2013-11-07 ENCOUNTER — Other Ambulatory Visit: Payer: Self-pay | Admitting: Family Medicine

## 2013-11-23 ENCOUNTER — Other Ambulatory Visit: Payer: Self-pay | Admitting: Family Medicine

## 2013-11-24 ENCOUNTER — Ambulatory Visit (INDEPENDENT_AMBULATORY_CARE_PROVIDER_SITE_OTHER): Payer: Medicare Other | Admitting: Family Medicine

## 2013-11-24 ENCOUNTER — Encounter: Payer: Self-pay | Admitting: Family Medicine

## 2013-11-24 VITALS — BP 129/65 | HR 86 | Temp 97.7°F | Ht 62.0 in | Wt 131.0 lb

## 2013-11-24 DIAGNOSIS — IMO0002 Reserved for concepts with insufficient information to code with codable children: Secondary | ICD-10-CM

## 2013-11-24 DIAGNOSIS — E1165 Type 2 diabetes mellitus with hyperglycemia: Secondary | ICD-10-CM

## 2013-11-24 DIAGNOSIS — IMO0001 Reserved for inherently not codable concepts without codable children: Secondary | ICD-10-CM

## 2013-11-24 DIAGNOSIS — I10 Essential (primary) hypertension: Secondary | ICD-10-CM

## 2013-11-24 DIAGNOSIS — E890 Postprocedural hypothyroidism: Secondary | ICD-10-CM

## 2013-11-24 LAB — POCT GLYCOSYLATED HEMOGLOBIN (HGB A1C): Hemoglobin A1C: 10.7

## 2013-11-24 NOTE — Assessment & Plan Note (Signed)
A1c 10.7, was 10.6 in late December. At last visit had asked pt to start doing 20 units of Lantus daily, but she is still doing 15. Went over the need to increase to 20 units daily, and for regular follow up. Will also add back 10 units of novolog with each meal.  Cardiac: on aspirin, statin Renal: on ACE Eye: discuss at next visit Foot: discuss at next visit (unable to access health maintenance tab today)

## 2013-11-24 NOTE — Assessment & Plan Note (Signed)
Well controlled, continue current regimen. Advised pt that it would be helpful for her to bring all her medicines to each visit since we were unable to do a true med reconciliation today.

## 2013-11-24 NOTE — Assessment & Plan Note (Signed)
Needs recheck of TSH, but pt would prefer to wait a few weeks while she finds out if she is going to lose her insurance. I am okay with waiting. She'll return in 2 weeks at which time we'll check her TSH.

## 2013-11-24 NOTE — Patient Instructions (Signed)
It was great to see you again today!  Take Lantus 20 units once per day and novolog 10 units with each meal. Return in 2 weeks. We will recheck your thyroid test then.  Call if you have any questions.  Be well, Dr. [name]

## 2013-11-24 NOTE — Progress Notes (Signed)
Patient ID: GIABELLA JAGOW, female   DOB: 1946/06/26, 68 y.o.   MRN: [id number]  HPI:  Diabetes: currently taking lantus 15 units every day. Is not taking any mealtime insulin. Her CBG's have been all over 200. Denies chest pain. She's currently very worried about losing her insurance due to a payment issue.  HTN: Did not bring meds with her today. Denies having any swelling. Occasional SOB which she attributes to anxiety/getting worked up over Art therapist. Not worse with exertion.   Hypothyroidism: had elevated TSH a few months ago, with resultant increase in synthroid. She thinks her thyroid is well controlled. Would like to wait a few weeks to recheck TSH due to waiting to find out about her insurance status.  ROS: See HPI  [place]: Had PNA a few months ago and wakes up on occasion with mucous in her throat and coughs, but overall is better.  PHYSICAL EXAM: BP 129/65  Pulse 86  Temp(Src) 97.7 F (36.5 C) (Oral)  Ht 5\' 2"  (1.575 m)  Wt 131 lb (59.421 kg)  BMI 23.95 kg/m2 Gen: NAD, somewhat anxious appearing about insurance HEENT: NCAT Heart: RRR, no murmurs Lungs: CTAB, NWOB, no crackles Neuro: grossly nonfocal, speech intact Ext: No appreciable lower extremity edema bilaterally   ASSESSMENT/PLAN:  See problem based charting for additional assessment/plan.  FOLLOW UP: F/u in 2 weeks for titration of insulin & recheck of TSH.  [place]. [name], [place]

## 2013-12-08 ENCOUNTER — Encounter: Payer: Self-pay | Admitting: Family Medicine

## 2013-12-08 ENCOUNTER — Ambulatory Visit (INDEPENDENT_AMBULATORY_CARE_PROVIDER_SITE_OTHER): Payer: Medicare Other | Admitting: Family Medicine

## 2013-12-08 VITALS — BP 136/83 | HR 85 | Temp 97.6°F | Ht 62.0 in | Wt 130.7 lb

## 2013-12-08 DIAGNOSIS — J3489 Other specified disorders of nose and nasal sinuses: Secondary | ICD-10-CM

## 2013-12-08 DIAGNOSIS — R0981 Nasal congestion: Secondary | ICD-10-CM | POA: Insufficient documentation

## 2013-12-08 NOTE — Patient Instructions (Signed)
Please start back on the flonase and the zyrtec. I suspect that the feeling you are having in the morning is from post nasal drip.   If you do not get better in a couple of weeks, please follow up.   Allergies Allergies may happen from anything your body is sensitive to. This may be food, medicines, pollens, chemicals, and nearly anything around you in everyday life that produces allergens. An allergen is anything that causes an allergy producing substance. Heredity is often a factor in causing these problems. This means you may have some of the same allergies as your parents. Food allergies happen in all age groups. Food allergies are some of the most severe and life threatening. Some common food allergies are cow's milk, seafood, eggs, nuts, wheat, and soybeans. SYMPTOMS   Swelling around the mouth.  An itchy red rash or hives.  Vomiting or diarrhea.  Difficulty breathing. SEVERE ALLERGIC REACTIONS ARE LIFE-THREATENING. This reaction is called anaphylaxis. It can cause the mouth and throat to swell and cause difficulty with breathing and swallowing. In severe reactions only a trace amount of food (for example, peanut oil in a salad) may cause death within seconds. Seasonal allergies occur in all age groups. These are seasonal because they usually occur during the same season every year. They may be a reaction to molds, grass pollens, or tree pollens. Other causes of problems are house dust mite allergens, pet dander, and mold spores. The symptoms often consist of nasal congestion, a runny itchy nose associated with sneezing, and tearing itchy eyes. There is often an associated itching of the mouth and ears. The problems happen when you come in contact with pollens and other allergens. Allergens are the particles in the air that the body reacts to with an allergic reaction. This causes you to release allergic antibodies. Through a chain of events, these eventually cause you to release histamine  into the blood stream. Although it is meant to be protective to the body, it is this release that causes your discomfort. This is why you were given anti-histamines to feel better. If you are unable to pinpoint the offending allergen, it may be determined by skin or blood testing. Allergies cannot be cured but can be controlled with medicine. Hay fever is a collection of all or some of the seasonal allergy problems. It may often be treated with simple over-the-counter medicine such as diphenhydramine. Take medicine as directed. Do not drink alcohol or drive while taking this medicine. Check with your caregiver or package insert for child dosages. If these medicines are not effective, there are many new medicines your caregiver can prescribe. Stronger medicine such as nasal spray, eye drops, and corticosteroids may be used if the first things you try do not work well. Other treatments such as immunotherapy or desensitizing injections can be used if all else fails. Follow up with your caregiver if problems continue. These seasonal allergies are usually not life threatening. They are generally more of a nuisance that can often be handled using medicine. HOME CARE INSTRUCTIONS   If unsure what causes a reaction, keep a diary of foods eaten and symptoms that follow. Avoid foods that cause reactions.  If hives or rash are present:  Take medicine as directed.  You may use an over-the-counter antihistamine (diphenhydramine) for hives and itching as needed.  Apply cold compresses (cloths) to the skin or take baths in cool water. Avoid hot baths or showers. Heat will make a rash and itching worse.  If you are severely allergic:  Following a treatment for a severe reaction, hospitalization is often required for closer follow-up.  Wear a medic-alert bracelet or necklace stating the allergy.  You and your family must learn how to give adrenaline or use an anaphylaxis kit.  If you have had a severe  reaction, always carry your anaphylaxis kit or EpiPen with you. Use this medicine as directed by your caregiver if a severe reaction is occurring. Failure to do so could have a fatal outcome. SEEK MEDICAL CARE IF:  You suspect a food allergy. Symptoms generally happen within 30 minutes of eating a food.  Your symptoms have not gone away within 2 days or are getting worse.  You develop new symptoms.  You want to retest yourself or your child with a food or drink you think causes an allergic reaction. Never do this if an anaphylactic reaction to that food or drink has happened before. Only do this under the care of a caregiver. SEEK IMMEDIATE MEDICAL CARE IF:   You have difficulty breathing, are wheezing, or have a tight feeling in your chest or throat.  You have a swollen mouth, or you have hives, swelling, or itching all over your body.  You have had a severe reaction that has responded to your anaphylaxis kit or an EpiPen. These reactions may return when the medicine has worn off. These reactions should be considered life threatening. MAKE SURE YOU:   Understand these instructions.  Will watch your condition.  Will get help right away if you are not doing well or get worse. Document Released: 10/29/2002 Document Revised: 11/30/2012 Document Reviewed: 04/04/2008 [facility] Patient Information 2014 [place].

## 2013-12-08 NOTE — Assessment & Plan Note (Signed)
Morning symptoms of throat congestion consistent with post nasal drip likely from allergies.  Treat with zyrtec and flonase daily.  If not better in 2-3 weeks, return to care.

## 2013-12-08 NOTE — Progress Notes (Signed)
Patient ID: [name]    DOB: 1946/01/04, 68 y.o.   MRN: [id number] --- Subjective:  [name] is a 68 y.o.female who presents for concern of morning throat congestion.   - patient reports that she wakes up in the morning with congestion in her throat and need to try and cough phlegm up without success. This started right after she was diagnosed and treated for pneumonia in January. Symptoms have been stable and have not worsened. Symptoms clear quickly in the morning and do not cause her trouble for the rest of the day Some mild rhinorrhea. Some congestion. No sneezing. No cough. No ear pain. No sore throat. No heartburn symptoms. no shortness of breath.   ROS: see HPI Past Medical History: reviewed and updated medications and allergies. Social History: Tobacco: never smoker  Objective: Filed Vitals:   12/08/13 1114  BP: 136/83  Pulse: 85  Temp: 97.6 F (36.4 C)    Physical Examination:   General appearance - alert, well appearing, and in no distress Ears - bilateral TM's and external ear canals normal Nose - erythematous and congested nasal turbinates bilaterally, no sinus tenderness Mouth - moist mucous membranes, cobblestone pattern in posterior oropharynx consistent with postnasal drip changes Neck - supple, no significant adenopathy Chest - clear to auscultation, no wheezes, rales or rhonchi, symmetric air entry Heart - normal rate, regular rhythm, normal S1, S2, no murmurs

## 2013-12-21 ENCOUNTER — Other Ambulatory Visit: Payer: Self-pay | Admitting: Family Medicine

## 2014-03-16 ENCOUNTER — Other Ambulatory Visit: Payer: Self-pay | Admitting: Family Medicine

## 2014-03-16 ENCOUNTER — Encounter: Payer: Self-pay | Admitting: Family Medicine

## 2014-03-16 ENCOUNTER — Ambulatory Visit (INDEPENDENT_AMBULATORY_CARE_PROVIDER_SITE_OTHER): Payer: Medicare Other | Admitting: Family Medicine

## 2014-03-16 VITALS — BP 125/65 | HR 84 | Temp 98.1°F | Ht 62.0 in | Wt 126.3 lb

## 2014-03-16 DIAGNOSIS — Z0000 Encounter for general adult medical examination without abnormal findings: Secondary | ICD-10-CM

## 2014-03-16 DIAGNOSIS — IMO0002 Reserved for concepts with insufficient information to code with codable children: Secondary | ICD-10-CM

## 2014-03-16 DIAGNOSIS — E118 Type 2 diabetes mellitus with unspecified complications: Secondary | ICD-10-CM

## 2014-03-16 DIAGNOSIS — Z13820 Encounter for screening for osteoporosis: Secondary | ICD-10-CM

## 2014-03-16 DIAGNOSIS — E890 Postprocedural hypothyroidism: Secondary | ICD-10-CM

## 2014-03-16 DIAGNOSIS — M069 Rheumatoid arthritis, unspecified: Secondary | ICD-10-CM

## 2014-03-16 DIAGNOSIS — E2839 Other primary ovarian failure: Secondary | ICD-10-CM

## 2014-03-16 DIAGNOSIS — Z780 Asymptomatic menopausal state: Secondary | ICD-10-CM

## 2014-03-16 DIAGNOSIS — IMO0001 Reserved for inherently not codable concepts without codable children: Secondary | ICD-10-CM

## 2014-03-16 DIAGNOSIS — E119 Type 2 diabetes mellitus without complications: Secondary | ICD-10-CM

## 2014-03-16 DIAGNOSIS — E1165 Type 2 diabetes mellitus with hyperglycemia: Secondary | ICD-10-CM

## 2014-03-16 DIAGNOSIS — E039 Hypothyroidism, unspecified: Secondary | ICD-10-CM

## 2014-03-16 LAB — COMPREHENSIVE METABOLIC PANEL
ALBUMIN: 3 g/dL — AB (ref 3.5–5.2)
ALT: <8 U/L (ref 0–35)
AST: 9 U/L (ref 0–37)
Alkaline Phosphatase: 86 U/L (ref 39–117)
BUN: 13 mg/dL (ref 6–23)
CHLORIDE: 100 meq/L (ref 96–112)
CO2: 28 mEq/L (ref 19–32)
CREATININE: 0.83 mg/dL (ref 0.50–1.10)
Calcium: 9.1 mg/dL (ref 8.4–10.5)
GLUCOSE: 268 mg/dL — AB (ref 70–99)
POTASSIUM: 4.1 meq/L (ref 3.5–5.3)
Sodium: 135 mEq/L (ref 135–145)
Total Bilirubin: 0.3 mg/dL (ref 0.2–1.2)
Total Protein: 8.5 g/dL — ABNORMAL HIGH (ref 6.0–8.3)

## 2014-03-16 LAB — TSH: TSH: 8.349 u[IU]/mL — ABNORMAL HIGH (ref 0.350–4.500)

## 2014-03-16 LAB — POCT GLYCOSYLATED HEMOGLOBIN (HGB A1C): Hemoglobin A1C: 10.5

## 2014-03-16 MED ORDER — ROSUVASTATIN CALCIUM 20 MG PO TABS: 20.0000 mg | ORAL_TABLET | Freq: Every day | ORAL | Status: DC

## 2014-03-16 MED ORDER — GLUCOSE BLOOD VI STRP: ORAL_STRIP | Status: DC

## 2014-03-16 NOTE — Assessment & Plan Note (Signed)
Has not followed up previously as directed. Will check TSH today and titrate synthroid as needed.

## 2014-03-16 NOTE — Progress Notes (Signed)
Patient ID: [name], female   DOB: Dec 20, 1945, 68 y.o.   MRN: [id number]  HPI:  Patient presents today for a well woman exam.   Concerns today: rheumatologist fired her, needs rheumatology referral Periods: no periods or vaginal bleeding (postmenopausal) Contraception: abstinent Pelvic symptoms: no vaginal discharge or pelvic pain Sexual activity: no partners in the year STD Screening: declines Pap smear status: over 65, no hx of abnormal pap smears Exercise: exercises every day with arms, does zumba at senior center, walks Diet: does not eat healthy Substances: no smoke, alcohol, drugs  ROS: See HPI  [place]:  Cancers in family: colon cancer (grandmother & brother), niece died of breast cancer, sister had throat cancer  PHYSICAL EXAM: BP 125/65  Pulse 84  Temp(Src) 98.1 F (36.7 C) (Oral)  Ht 5\' 2"  (1.575 m)  Wt 126 lb 4.8 oz (57.289 kg)  BMI 23.09 kg/m2 Gen: NAD, pleasant, cooperative HEENT: NCAT, PERRL, no palpable thyromegaly or anterior cervical lymphadenopathy Heart: RRR, no murmurs Lungs: CTAB, NWOB Abdomen: soft, nontender to palpation Neuro: grossly nonfocal, speech normal  ASSESSMENT/PLAN:  Health maintenance -STD screening: declines today -pap smear: not indicated over 68 -mammogram: needs, will give handout today -lipid screening: not on crestor as she is supposed to be, will refill today -colonoscopy: will give handout to get setup, especially given family hx of colon cancer -osteoporosis screening: bone density scan ordered today -zostavax: handout given today -pneumovax: need to address at future visit -handout given on health maintenance topics  RHEUMATOID ARTHRITIS (NOT JUVENILE) Previously on methotrexate and prednisone therapy prescribed by rheumatology. She was recently fired from her rheumatologist office over what sounds like confusion about when she was supposed to get labs. I am not entirely sure of the details. She requests referral to new  rheumatologist today. Referral entered.  Other postablative hypothyroidism Has not followed up previously as directed. Will check TSH today and titrate synthroid as needed.  DM (diabetes mellitus), type 2, uncontrolled Has not followed up regularly as directed. Last visit was over 3 months ago and was instructed to return in 2 weeks. Will check A1c today (since we did just a health maintenance physical today) and have pt return in a few weeks to discuss diabetes. Stressed importance of this to patient today.   FOLLOW UP: F/u in a few weeks for diabetes.  [place]. [name], [place]

## 2014-03-16 NOTE — Assessment & Plan Note (Deleted)
Previously on methotrexate and prednisone therapy prescribed by rheumatology. She was recently fired from her rheumatologist office over what sounds like confusion about when she was supposed to get labs. I am not entirely sure of the details. She requests referral to new rheumatologist today. Referral entered.

## 2014-03-16 NOTE — Assessment & Plan Note (Signed)
Previously on methotrexate and prednisone therapy prescribed by rheumatology. She was recently fired from her rheumatologist office over what sounds like confusion about when she was supposed to get labs. I am not entirely sure of the details. She requests referral to new rheumatologist today. Referral entered.

## 2014-03-16 NOTE — Assessment & Plan Note (Signed)
Has not followed up regularly as directed. Last visit was over 3 months ago and was instructed to return in 2 weeks. Will check A1c today (since we did just a health maintenance physical today) and have pt return in a few weeks to discuss diabetes. Stressed importance of this to patient today.

## 2014-03-16 NOTE — Patient Instructions (Signed)
See the handout on how to schedule your colonoscopy. This is an important screening test for colon cancer.  See the handout on how to schedule your mammogram. This is an important test to screen for breast cancer. See handout on shingles and pneumonia shots. We are checking a bone density picture as well.  I am referring you to rheumatology for your rheumatoid arthritis. You will get a phone call to schedule this appointment.   I will call you if your test results are not normal.  Otherwise, I will send you a letter.  If you do not hear from me with in 2 weeks please call our office.     Follow up in a few weeks for your diabetes. This is very important!!   Health Maintenance Adopting a healthy lifestyle and getting preventive care can go a long way to promote health and wellness. Talk with your health care provider about what schedule of regular examinations is right for you. This is a good chance for you to check in with your provider about disease prevention and staying healthy. In between checkups, there are plenty of things you can do on your own. Experts have done a lot of research about which lifestyle changes and preventive measures are most likely to keep you healthy. Ask your health care provider for more information. WEIGHT AND DIET  Eat a healthy diet  Be sure to include plenty of vegetables, fruits, low-fat dairy products, and lean protein.  Do not eat a lot of foods high in solid fats, added sugars, or salt.  Get regular exercise. This is one of the most important things you can do for your health.  Most adults should exercise for at least 150 minutes each week. The exercise should increase your heart rate and make you sweat (moderate-intensity exercise).  Most adults should also do strengthening exercises at least twice a week. This is in addition to the moderate-intensity exercise.  Maintain a healthy weight  Body mass index (BMI) is a measurement that can be used to  identify possible weight problems. It estimates body fat based on height and weight. Your health care provider can help determine your BMI and help you achieve or maintain a healthy weight.  For females 52 years of age and older:   A BMI below 18.5 is considered underweight.  A BMI of 18.5 to 24.9 is normal.  A BMI of 25 to 29.9 is considered overweight.  A BMI of 30 and above is considered obese.  Watch levels of cholesterol and blood lipids  You should start having your blood tested for lipids and cholesterol at 68 years of age, then have this test every 5 years.  You may need to have your cholesterol levels checked more often if:  Your lipid or cholesterol levels are high.  You are older than 68 years of age.  You are at high risk for heart disease.  CANCER SCREENING   Lung Cancer  Lung cancer screening is recommended for adults 52-27 years old who are at high risk for lung cancer because of a history of smoking.  A yearly low-dose CT scan of the lungs is recommended for people who:  Currently smoke.  Have quit within the past 15 years.  Have at least a 30-pack-year history of smoking. A pack year is smoking an average of one pack of cigarettes a day for 1 year.  Yearly screening should continue until it has been 15 years since you quit.  Yearly screening  should stop if you develop a health problem that would prevent you from having lung cancer treatment.  Breast Cancer  Practice breast self-awareness. This means understanding how your breasts normally appear and feel.  It also means doing regular breast self-exams. Let your health care provider know about any changes, no matter how small.  If you are in your 20s or 30s, you should have a clinical breast exam (CBE) by a health care provider every 1-3 years as part of a regular health exam.  If you are 72 or older, have a CBE every year. Also consider having a breast X-ray (mammogram) every year.  If you have a  family history of breast cancer, talk to your health care provider about genetic screening.  If you are at high risk for breast cancer, talk to your health care provider about having an MRI and a mammogram every year.  Breast cancer gene (BRCA) assessment is recommended for women who have family members with BRCA-related cancers. BRCA-related cancers include:  Breast.  Ovarian.  Tubal.  Peritoneal cancers.  Results of the assessment will determine the need for genetic counseling and BRCA1 and BRCA2 testing. Cervical Cancer Routine pelvic examinations to screen for cervical cancer are no longer recommended for nonpregnant women who are considered low risk for cancer of the pelvic organs (ovaries, uterus, and vagina) and who do not have symptoms. A pelvic examination may be necessary if you have symptoms including those associated with pelvic infections. Ask your health care provider if a screening pelvic exam is right for you.   The Pap test is the screening test for cervical cancer for women who are considered at risk.  If you had a hysterectomy for a problem that was not cancer or a condition that could lead to cancer, then you no longer need Pap tests.  If you are older than 65 years, and you have had normal Pap tests for the past 10 years, you no longer need to have Pap tests.  If you have had past treatment for cervical cancer or a condition that could lead to cancer, you need Pap tests and screening for cancer for at least 20 years after your treatment.  If you no longer get a Pap test, assess your risk factors if they change (such as having a new sexual partner). This can affect whether you should start being screened again.  Some women have medical problems that increase their chance of getting cervical cancer. If this is the case for you, your health care provider may recommend more frequent screening and Pap tests.  The human papillomavirus (HPV) test is another test that may  be used for cervical cancer screening. The HPV test looks for the virus that can cause cell changes in the cervix. The cells collected during the Pap test can be tested for HPV.  The HPV test can be used to screen women 38 years of age and older. Getting tested for HPV can extend the interval between normal Pap tests from three to five years.  An HPV test also should be used to screen women of any age who have unclear Pap test results.  After 68 years of age, women should have HPV testing as often as Pap tests.  Colorectal Cancer  This type of cancer can be detected and often prevented.  Routine colorectal cancer screening usually begins at 68 years of age and continues through 68 years of age.  Your health care provider may recommend screening at an earlier  age if you have risk factors for colon cancer.  Your health care provider may also recommend using home test kits to check for hidden blood in the stool.  A small camera at the end of a tube can be used to examine your colon directly (sigmoidoscopy or colonoscopy). This is done to check for the earliest forms of colorectal cancer.  Routine screening usually begins at age 4.  Direct examination of the colon should be repeated every 5-10 years through 68 years of age. However, you may need to be screened more often if early forms of precancerous polyps or small growths are found. Skin Cancer  Check your skin from head to toe regularly.  Tell your health care provider about any new moles or changes in moles, especially if there is a change in a mole's shape or color.  Also tell your health care provider if you have a mole that is larger than the size of a pencil eraser.  Always use sunscreen. Apply sunscreen liberally and repeatedly throughout the day.  Protect yourself by wearing long sleeves, pants, a wide-brimmed hat, and sunglasses whenever you are outside. HEART DISEASE, DIABETES, AND HIGH BLOOD PRESSURE   Have your blood  pressure checked at least every 1-2 years. High blood pressure causes heart disease and increases the risk of stroke.  If you are between 75 years and 26 years old, ask your health care provider if you should take aspirin to prevent strokes.  Have regular diabetes screenings. This involves taking a blood sample to check your fasting blood sugar level.  If you are at a normal weight and have a low risk for diabetes, have this test once every three years after 68 years of age.  If you are overweight and have a high risk for diabetes, consider being tested at a younger age or more often. PREVENTING INFECTION  Hepatitis B  If you have a higher risk for hepatitis B, you should be screened for this virus. You are considered at high risk for hepatitis B if:  You were born in a country where hepatitis B is common. Ask your health care provider which countries are considered high risk.  Your parents were born in a high-risk country, and you have not been immunized against hepatitis B (hepatitis B vaccine).  You have HIV or AIDS.  You use needles to inject street drugs.  You live with someone who has hepatitis B.  You have had sex with someone who has hepatitis B.  You get hemodialysis treatment.  You take certain medicines for conditions, including cancer, organ transplantation, and autoimmune conditions. Hepatitis C  Blood testing is recommended for:  Everyone born from [birth year] through 1965.  Anyone with known risk factors for hepatitis C. Sexually transmitted infections (STIs)  You should be screened for sexually transmitted infections (STIs) including gonorrhea and chlamydia if:  You are sexually active and are younger than 68 years of age.  You are older than 68 years of age and your health care provider tells you that you are at risk for this type of infection.  Your sexual activity has changed since you were last screened and you are at an increased risk for chlamydia or  gonorrhea. Ask your health care provider if you are at risk.  If you do not have HIV, but are at risk, it may be recommended that you take a prescription medicine daily to prevent HIV infection. This is called pre-exposure prophylaxis (PrEP). You are considered at risk if:  You are sexually active and do not regularly use condoms or know the HIV status of your partner(s).  You take drugs by injection.  You are sexually active with a partner who has HIV. Talk with your health care provider about whether you are at high risk of being infected with HIV. If you choose to begin PrEP, you should first be tested for HIV. You should then be tested every 3 months for as long as you are taking PrEP.  PREGNANCY   If you are premenopausal and you may become pregnant, ask your health care provider about preconception counseling.  If you may become pregnant, take 400 to 800 micrograms (mcg) of folic acid every day.  If you want to prevent pregnancy, talk to your health care provider about birth control (contraception). OSTEOPOROSIS AND MENOPAUSE   Osteoporosis is a disease in which the bones lose minerals and strength with aging. This can result in serious bone fractures. Your risk for osteoporosis can be identified using a bone density scan.  If you are 49 years of age or older, or if you are at risk for osteoporosis and fractures, ask your health care provider if you should be screened.  Ask your health care provider whether you should take a calcium or vitamin D supplement to lower your risk for osteoporosis.  Menopause may have certain physical symptoms and risks.  Hormone replacement therapy may reduce some of these symptoms and risks. Talk to your health care provider about whether hormone replacement therapy is right for you.  HOME CARE INSTRUCTIONS   Schedule regular health, dental, and eye exams.  Stay current with your immunizations.   Do not use any tobacco products including  cigarettes, chewing tobacco, or electronic cigarettes.  If you are pregnant, do not drink alcohol.  If you are breastfeeding, limit how much and how often you drink alcohol.  Limit alcohol intake to no more than 1 drink per day for nonpregnant women. One drink equals 12 ounces of beer, 5 ounces of wine, or 1 ounces of hard liquor.  Do not use street drugs.  Do not share needles.  Ask your health care provider for help if you need support or information about quitting drugs.  Tell your health care provider if you often feel depressed.  Tell your health care provider if you have ever been abused or do not feel safe at home. Document Released: 02/18/2011 Document Revised: 12/20/2013 Document Reviewed: 07/07/2013 [facility] Patient Information 2015 [place], [place]. This information is not intended to replace advice given to you by your health care provider. Make sure you discuss any questions you have with your health care provider.

## 2014-03-17 ENCOUNTER — Encounter: Payer: Self-pay | Admitting: Family Medicine

## 2014-03-17 ENCOUNTER — Telehealth: Payer: Self-pay | Admitting: *Deleted

## 2014-03-17 ENCOUNTER — Other Ambulatory Visit: Payer: Self-pay | Admitting: Family Medicine

## 2014-03-17 MED ORDER — LEVOTHYROXINE SODIUM 300 MCG PO TABS: 300.0000 ug | ORAL_TABLET | Freq: Every day | ORAL | Status: DC

## 2014-03-17 NOTE — Telephone Encounter (Signed)
Relayed message for increase of synthroid,patient voiced understanding.[name], [name]

## 2014-03-29 ENCOUNTER — Other Ambulatory Visit: Payer: Medicare Other

## 2014-04-04 ENCOUNTER — Other Ambulatory Visit: Payer: Self-pay | Admitting: Family Medicine

## 2014-04-11 ENCOUNTER — Ambulatory Visit: Payer: Medicare Other | Admitting: Family Medicine

## 2014-04-20 ENCOUNTER — Encounter: Payer: Self-pay | Admitting: Family Medicine

## 2014-04-20 ENCOUNTER — Ambulatory Visit (INDEPENDENT_AMBULATORY_CARE_PROVIDER_SITE_OTHER): Payer: Medicare Other | Admitting: Family Medicine

## 2014-04-20 VITALS — BP 142/77 | HR 86 | Temp 98.1°F | Ht 62.0 in | Wt 127.2 lb

## 2014-04-20 DIAGNOSIS — IMO0001 Reserved for inherently not codable concepts without codable children: Secondary | ICD-10-CM

## 2014-04-20 DIAGNOSIS — E118 Type 2 diabetes mellitus with unspecified complications: Principal | ICD-10-CM

## 2014-04-20 DIAGNOSIS — E1165 Type 2 diabetes mellitus with hyperglycemia: Secondary | ICD-10-CM

## 2014-04-20 DIAGNOSIS — IMO0002 Reserved for concepts with insufficient information to code with codable children: Secondary | ICD-10-CM

## 2014-04-20 MED ORDER — GLUCOSE BLOOD VI STRP: ORAL_STRIP | Status: DC

## 2014-04-20 MED ORDER — ACCU-CHEK SOFTCLIX LANCETS MISC: Status: DC

## 2014-04-20 MED ORDER — QUINAPRIL HCL 40 MG PO TABS: ORAL_TABLET | ORAL | Status: DC

## 2014-04-20 MED ORDER — HYDROCHLOROTHIAZIDE 25 MG PO TABS: ORAL_TABLET | ORAL | Status: DC

## 2014-04-20 MED ORDER — METFORMIN HCL 500 MG PO TABS
ORAL_TABLET | ORAL | Status: DC
Start: 2014-04-20 — End: 2014-07-11

## 2014-04-20 MED ORDER — INSULIN GLARGINE 100 UNIT/ML SOLOSTAR PEN
10.0000 [IU] | PEN_INJECTOR | Freq: Every day | SUBCUTANEOUS | Status: DC
Start: 2014-04-20 — End: 2014-10-11

## 2014-04-20 MED ORDER — INSULIN LISPRO 100 UNIT/ML (KWIKPEN): 3.0000 [IU] | PEN_INJECTOR | Freq: Three times a day (TID) | SUBCUTANEOUS | Status: DC

## 2014-04-20 MED ORDER — AMLODIPINE BESYLATE 10 MG PO TABS: ORAL_TABLET | ORAL | Status: DC

## 2014-04-20 MED ORDER — ROSUVASTATIN CALCIUM 20 MG PO TABS: 20.0000 mg | ORAL_TABLET | Freq: Every day | ORAL | Status: DC

## 2014-04-20 MED ORDER — INSULIN PEN NEEDLE 31G X 8 MM MISC: Status: DC

## 2014-04-20 NOTE — Assessment & Plan Note (Signed)
Unable to titrate insulin as she has not been taking it regularly, nor checking her sugars. Will refill Lantus and have her resume 10 units daily with humalog 3 units TID with meals. I printed off blood sugar charts for her to fill out. She has been instructed to call the clinic in 1 week with her blood sugar readings so that we can adjust her insulin over the phone. She will f/u with me in clinic in 1 month.

## 2014-04-20 NOTE — Patient Instructions (Signed)
It was great to see you again today!  Check your blood sugar 3 times a day and write down the numbers in the chart. Take Lantus 10 units at night and humalog 3 units with meals. Call us with your blood sugar readings in one week so we can make adjustments. Follow up in the office in a month.  For hip pain - take tylenol when this happens. Try ice or a heating pad. Return if worsens.  Be well, Dr. [name]

## 2014-04-20 NOTE — Progress Notes (Signed)
Patient ID: SAMRIDHI ADAMEK, female   DOB: 08-Aug-1946, 68 y.o.   MRN: [id number]  HPI:  Diabetes: out of lantus for the last few weeks, states [facility] never got the prescriptions (despite it showing as confirmed receipt by pharmacy in EMR). Was taking 10 units lantus a day before running out. Has been doing 3 units humalog before meals. A1c last month was 10.5. Has not been checking sugars regularly.  R side pain: has pain in R hip some mornings when she wakes up. Nothing severe or frequent, only sometimes. Has not tried taking tylenol  ROS: See HPI.  [place]: hx HTN, T2DM, hypothyroidism, rheumatoid arthritis, GERD  PHYSICAL EXAM: BP 142/77  Pulse 86  Temp(Src) 98.1 F (36.7 C) (Oral)  Ht 5\' 2"  (1.575 m)  Wt 127 lb 3.2 oz (57.698 kg)  BMI 23.26 kg/m2 Gen: NAD HEENT: NCAT Heart: RRR no murmurs Lungs: CTAB, NWOB Neuro: grossly nonfocal speech normal Ext: full strenght bilat lower ext with hip flexion/abduction/adduction, knee flexion/extension. Normal ROM of hips bilat internal & external rotation  ASSESSMENT/PLAN:  Health maintenance:  -pt declined flu shot today -pt wanted rx for pneumovax & zostavax but left without getting them (will need to be hand written)  Hip pain: vague, no abnormalities on exam. Recommend tylenol prn, also try ice/heat. Return for re-evaluation if worsening.  DM (diabetes mellitus), type 2, uncontrolled Unable to titrate insulin as she has not been taking it regularly, nor checking her sugars. Will refill Lantus and have her resume 10 units daily with humalog 3 units TID with meals. I printed off blood sugar charts for her to fill out. She has been instructed to call the clinic in 1 week with her blood sugar readings so that we can adjust her insulin over the phone. She will f/u with me in clinic in 1 month.   FOLLOW UP: F/u in 1 week by phone for DM F/u in 1 month in office for DM  [place]. [name], [place]

## 2014-05-23 ENCOUNTER — Ambulatory Visit: Payer: Medicare Other | Admitting: Family Medicine

## 2014-06-01 ENCOUNTER — Ambulatory Visit (INDEPENDENT_AMBULATORY_CARE_PROVIDER_SITE_OTHER): Payer: Medicare Other | Admitting: Family Medicine

## 2014-06-01 ENCOUNTER — Encounter: Payer: Self-pay | Admitting: Family Medicine

## 2014-06-01 VITALS — BP 147/72 | HR 86 | Temp 98.1°F | Ht 62.0 in | Wt 134.0 lb

## 2014-06-01 DIAGNOSIS — E1165 Type 2 diabetes mellitus with hyperglycemia: Secondary | ICD-10-CM

## 2014-06-01 DIAGNOSIS — R600 Localized edema: Secondary | ICD-10-CM

## 2014-06-01 DIAGNOSIS — I10 Essential (primary) hypertension: Secondary | ICD-10-CM

## 2014-06-01 DIAGNOSIS — IMO0002 Reserved for concepts with insufficient information to code with codable children: Secondary | ICD-10-CM

## 2014-06-01 LAB — POCT GLYCOSYLATED HEMOGLOBIN (HGB A1C): HEMOGLOBIN A1C: 9.5

## 2014-06-01 MED ORDER — FUROSEMIDE 20 MG PO TABS: 20.0000 mg | ORAL_TABLET | Freq: Every day | ORAL | Status: DC

## 2014-06-01 MED ORDER — QUINAPRIL HCL 5 MG PO TABS: ORAL_TABLET | ORAL | Status: DC

## 2014-06-01 NOTE — Patient Instructions (Signed)
It was great to see you again today!  For swelling: -checking kidney function -take lasix 20mg  daily for 2 weeks -return in 1-2 weeks to be reassessed  For Blood pressure: -take lower dose of quinapril (5mg  daily)  For diabetes: -increase lantus to 15 units daily -keep checking blood sugars -follow up in 2 weeks to adjust insulin  Be well, Dr. [name]

## 2014-06-01 NOTE — Progress Notes (Signed)
Patient ID: NYKIAH ROTHERMEL, female   DOB: 02/13/1946, 68 y.o.   MRN: [id number]  HPI:  Feet swelling: have been swollen since Friday. No SOB. Does not wear compression hose. Sits up to sleep. No chest pain. No sores on feet.   Diabetes: A1c 9.5 today. Currently taking lantus 10 units in the morning. Also lispro 3 units TID. No problems with low sugars. Fasting sugars running 160s+. Last eye exam was one week ago, supposed to f/u in 6 months.   HTN: used to take quinapril off and on but stopped due to low BP's. Was previously on quinapril 40mg  daily. Not taking amlodipine as ordered but is taking HCTZ.  ROS: See HPI.  [place]: hx HTN, rheumatoid arthritis, GERD, diabetic retinopathy, hypothyroidism  PHYSICAL EXAM: BP 147/72  Pulse 86  Temp(Src) 98.1 F (36.7 C) (Oral)  Ht 5\' 2"  (1.575 m)  Wt 134 lb (60.782 kg)  BMI 24.50 kg/m2 Gen: NAD HEENT: NCAT Heart: RRR  Lungs: CTAB, NWOB, no crackles or wheezes Neuro: grossly nonfocal, speech normal Ext: symmetric bilateral 2+ edema overlying ankles and shins, no erythema or warmth  ASSESSMENT/PLAN:  DM (diabetes mellitus), type 2, uncontrolled A1c 9.5 today, still room to go but overall improved. Given reports of high fasting sugars, will increase lantus to 15 units daily. F/u in a 2 weeks for additional titration of lantus.  Cardiac: on statin, aspirin Renal: decrease quinapril to 5mg  daily as did not tolerate higher dose due to reported low blood pressure Eye: last exam 1 week ago per pt Foot: due Jan 2016   HYPERTENSION, BENIGN SYSTEMIC BP mildly elevated today, but has not been taking amlodipine or quinapril. Will restart low dose quinapril 5mg  daily for renal protection. Repeat BP check in a few weeks when returns to f/u on swelling.  Bilateral lower extremity edema Etiology not entirely clear. No signs of dvt or infection on exam. No pulmonary symptoms or abnormal findings on lung examination. Plan: -check BMET today -lasix 20mg   daily x 2 weeks -f/u 2 weeks for repeat BMET & re-eval of swelling.   FOLLOW UP: F/u in 1-2 weeks for diabetes, HTN, lower ext edema.  [place]. [name], [place]

## 2014-06-02 LAB — BASIC METABOLIC PANEL
BUN: 10 mg/dL (ref 6–23)
CO2: 29 mEq/L (ref 19–32)
Calcium: 9.4 mg/dL (ref 8.4–10.5)
Chloride: 96 mEq/L (ref 96–112)
Creat: 0.76 mg/dL (ref 0.50–1.10)
GLUCOSE: 107 mg/dL — AB (ref 70–99)
POTASSIUM: 3.9 meq/L (ref 3.5–5.3)
SODIUM: 130 meq/L — AB (ref 135–145)

## 2014-06-06 DIAGNOSIS — R600 Localized edema: Secondary | ICD-10-CM

## 2014-06-06 HISTORY — DX: Localized edema: R60.0

## 2014-06-06 NOTE — Assessment & Plan Note (Addendum)
A1c 9.5 today, still room to go but overall improved. Given reports of high fasting sugars, will increase lantus to 15 units daily. F/u in a 2 weeks for additional titration of lantus.  Cardiac: on statin, aspirin Renal: decrease quinapril to 5mg  daily as did not tolerate higher dose due to reported low blood pressure Eye: last exam 1 week ago per pt Foot: due Jan 2016

## 2014-06-06 NOTE — Assessment & Plan Note (Signed)
BP mildly elevated today, but has not been taking amlodipine or quinapril. Will restart low dose quinapril 5mg  daily for renal protection. Repeat BP check in a few weeks when returns to f/u on swelling.

## 2014-06-06 NOTE — Assessment & Plan Note (Signed)
Etiology not entirely clear. No signs of dvt or infection on exam. No pulmonary symptoms or abnormal findings on lung examination. Plan: -check BMET today -lasix 20mg  daily x 2 weeks -f/u 2 weeks for repeat BMET & re-eval of swelling.

## 2014-06-13 ENCOUNTER — Other Ambulatory Visit: Payer: Self-pay | Admitting: Family Medicine

## 2014-06-13 ENCOUNTER — Encounter: Payer: Self-pay | Admitting: Family Medicine

## 2014-06-13 ENCOUNTER — Telehealth: Payer: Self-pay | Admitting: Family Medicine

## 2014-06-13 DIAGNOSIS — E871 Hypo-osmolality and hyponatremia: Secondary | ICD-10-CM

## 2014-06-13 DIAGNOSIS — E039 Hypothyroidism, unspecified: Secondary | ICD-10-CM

## 2014-06-13 NOTE — Telephone Encounter (Signed)
Called pt to discuss need for repeat BMET due to mildly low sodium. She will come in for a lab appt this week since she wasn't able to schedule an office visit until later in  November. Reports swelling is much improved. Feeling well. Will enter orders for future BMET & TSH since she's due for a repeat TSH (had dose increased 2 mos ago).

## 2014-07-05 ENCOUNTER — Other Ambulatory Visit: Payer: Medicare Other

## 2014-07-05 ENCOUNTER — Ambulatory Visit: Payer: Medicare Other | Admitting: Family Medicine

## 2014-07-05 DIAGNOSIS — E871 Hypo-osmolality and hyponatremia: Secondary | ICD-10-CM

## 2014-07-05 DIAGNOSIS — E039 Hypothyroidism, unspecified: Secondary | ICD-10-CM

## 2014-07-06 LAB — BASIC METABOLIC PANEL
BUN: 18 mg/dL (ref 6–23)
CO2: 29 mEq/L (ref 19–32)
CREATININE: 0.82 mg/dL (ref 0.50–1.10)
Calcium: 9.4 mg/dL (ref 8.4–10.5)
Chloride: 98 mEq/L (ref 96–112)
Glucose, Bld: 180 mg/dL — ABNORMAL HIGH (ref 70–99)
POTASSIUM: 4 meq/L (ref 3.5–5.3)
Sodium: 133 mEq/L — ABNORMAL LOW (ref 135–145)

## 2014-07-06 LAB — TSH: TSH: 14.799 u[IU]/mL — AB (ref 0.350–4.500)

## 2014-07-07 ENCOUNTER — Telehealth: Payer: Self-pay | Admitting: Family Medicine

## 2014-07-07 NOTE — Telephone Encounter (Signed)
[place] red team, Please call pt and let her know that her thyroid level is abnormal and we will discuss what to do about it in the visit she has scheduled for Monday. Thanks! [name], MD

## 2014-07-11 ENCOUNTER — Ambulatory Visit (INDEPENDENT_AMBULATORY_CARE_PROVIDER_SITE_OTHER): Payer: Medicare Other | Admitting: Family Medicine

## 2014-07-11 ENCOUNTER — Encounter: Payer: Self-pay | Admitting: Family Medicine

## 2014-07-11 VITALS — BP 144/74 | HR 89 | Temp 97.6°F | Ht 62.0 in | Wt 128.0 lb

## 2014-07-11 DIAGNOSIS — E039 Hypothyroidism, unspecified: Secondary | ICD-10-CM

## 2014-07-11 DIAGNOSIS — E1165 Type 2 diabetes mellitus with hyperglycemia: Secondary | ICD-10-CM

## 2014-07-11 DIAGNOSIS — IMO0002 Reserved for concepts with insufficient information to code with codable children: Secondary | ICD-10-CM

## 2014-07-11 LAB — T4, FREE: Free T4: 0.92 ng/dL (ref 0.80–1.80)

## 2014-07-11 LAB — TSH: TSH: 8.203 u[IU]/mL — ABNORMAL HIGH (ref 0.350–4.500)

## 2014-07-11 MED ORDER — METFORMIN HCL 500 MG PO TABS: ORAL_TABLET | ORAL | Status: DC

## 2014-07-11 NOTE — Progress Notes (Signed)
Patient ID: [name], female   DOB: [birth year], 68 y.o.   MRN: [id number]  HPI:  F/u diabetes: requests that we d/c her insulin. Has been having to keep it at a friend's house because refridgerator broke, and thus is only getting insulin 4 out of 7 days of the week. It's been hard for her to get there to take it. Would like to just try oral medications. Knows that this won't likely help control her diabetes. Not taking metformin right now, hasn't for a few months. Current sugars range from 100 to 200's. Denies CP or SOB.  Hypothyroidism: recent TSH elevated. Pt states she is taking her synthroid 356mcg every day before a meal. Had radioactive iodine ablation >10 years ago by Dr. Wilson Keller. States she was dismissed from his practice for an unknown reason.  ROS: See HPI.  [name]: hx hypothyroidism, T2DM, HTN, rheumatoid arthritis  PHYSICAL EXAM: BP 144/74 mmHg  Pulse 89  Temp(Src) 97.6 F (36.4 C) (Oral)  Ht 5\' 2"  (1.575 m)  Wt 128 lb (58.06 kg)  BMI 23.41 kg/m2 Gen: NAD, pleasant, cooperative HEENT: NCAT, no thyromegaly or nodules appreciable Heart: RRR no murmurs Lungs: CTAB, NWOB Neuro: grossly nonfocal speech normal Ext: No appreciable lower extremity edema bilaterally   ASSESSMENT/PLAN:  DM (diabetes mellitus), type 2, uncontrolled Pt requesting to d/c insulin. Unable to administer this regularly due to not having a refridgerator at home that is working. Advised that this will likely not control her diabetes with oral medications alone, but will start with metformin 500mg  BID, titrate up to 1000mg  BID. F/u in 1 month, at which time will consider adding sulfonylurea.  Hypothyroidism TSH elevated on recent check. Pt on high dose synthroid (382mcg daily) and states she is compliant. Will recheck TSH, also free T4. Await these results before making any med adjustments. Consider increasing synthroid vs referral to endocrine.   FOLLOW UP: F/u in 4 weeks for diabetes.  [name].  [name], [name]

## 2014-07-11 NOTE — Assessment & Plan Note (Signed)
Pt requesting to d/c insulin. Unable to administer this regularly due to not having a refridgerator at home that is working. Advised that this will likely not control her diabetes with oral medications alone, but will start with metformin 500mg  BID, titrate up to 1000mg  BID. F/u in 1 month, at which time will consider adding sulfonylurea.

## 2014-07-11 NOTE — Assessment & Plan Note (Signed)
TSH elevated on recent check. Pt on high dose synthroid (350mcg daily) and states she is compliant. Will recheck TSH, also free T4. Await these results before making any med adjustments. Consider increasing synthroid vs referral to endocrine.

## 2014-07-11 NOTE — Telephone Encounter (Signed)
Discussed at office visit today. 

## 2014-07-11 NOTE — Patient Instructions (Signed)
For diabetes: Start metformin 500mg  twice a day. After one week, go up to 1000mg  twice a day Follow up in 1 month  For thyroid: -rechecking labwork today  I will see you in1 month.  Be well, Dr. [name]

## 2014-07-23 ENCOUNTER — Other Ambulatory Visit: Payer: Self-pay | Admitting: Family Medicine

## 2014-08-08 ENCOUNTER — Ambulatory Visit: Payer: Medicare Other | Admitting: Family Medicine

## 2014-08-11 ENCOUNTER — Other Ambulatory Visit: Payer: Self-pay | Admitting: Family Medicine

## 2014-08-17 ENCOUNTER — Ambulatory Visit: Payer: Medicare Other | Admitting: Family Medicine

## 2014-10-11 ENCOUNTER — Encounter: Payer: Self-pay | Admitting: Family Medicine

## 2014-10-11 ENCOUNTER — Ambulatory Visit (INDEPENDENT_AMBULATORY_CARE_PROVIDER_SITE_OTHER): Payer: Medicare Other | Admitting: Family Medicine

## 2014-10-11 VITALS — BP 163/86 | HR 87 | Temp 97.6°F | Ht 62.0 in | Wt 136.0 lb

## 2014-10-11 DIAGNOSIS — E039 Hypothyroidism, unspecified: Secondary | ICD-10-CM

## 2014-10-11 DIAGNOSIS — IMO0002 Reserved for concepts with insufficient information to code with codable children: Secondary | ICD-10-CM

## 2014-10-11 DIAGNOSIS — I10 Essential (primary) hypertension: Secondary | ICD-10-CM

## 2014-10-11 DIAGNOSIS — M069 Rheumatoid arthritis, unspecified: Secondary | ICD-10-CM

## 2014-10-11 DIAGNOSIS — E1165 Type 2 diabetes mellitus with hyperglycemia: Secondary | ICD-10-CM

## 2014-10-11 LAB — POCT GLYCOSYLATED HEMOGLOBIN (HGB A1C): Hemoglobin A1C: 12.7

## 2014-10-11 MED ORDER — ROSUVASTATIN CALCIUM 20 MG PO TABS: 20.0000 mg | ORAL_TABLET | Freq: Every day | ORAL | Status: DC

## 2014-10-11 MED ORDER — INSULIN GLARGINE 100 UNIT/ML SOLOSTAR PEN: 10.0000 [IU] | PEN_INJECTOR | Freq: Every day | SUBCUTANEOUS | Status: DC

## 2014-10-11 MED ORDER — HYDROCHLOROTHIAZIDE 25 MG PO TABS: ORAL_TABLET | ORAL | Status: DC

## 2014-10-11 MED ORDER — LEVOTHYROXINE SODIUM 50 MCG PO TABS: 50.0000 ug | ORAL_TABLET | Freq: Every day | ORAL | Status: DC

## 2014-10-11 MED ORDER — METFORMIN HCL 1000 MG PO TABS: 1000.0000 mg | ORAL_TABLET | Freq: Two times a day (BID) | ORAL | Status: DC

## 2014-10-11 MED ORDER — AMLODIPINE BESYLATE 10 MG PO TABS: ORAL_TABLET | ORAL | Status: DC

## 2014-10-11 MED ORDER — QUINAPRIL HCL 5 MG PO TABS: ORAL_TABLET | ORAL | Status: DC

## 2014-10-11 NOTE — Patient Instructions (Addendum)
Take levothyroxine 60mcg daily - we have to start low and titrate you up Do Lantus 10 units daily to start I sent in refills of your medicines  Follow up with me in about 3 weeks to see how you're doing and adjust your lantus and levothyroxine  Be well, Dr. [name]

## 2014-10-11 NOTE — Progress Notes (Signed)
Patient ID: [name], female   DOB: 09-27-1945, 69 y.o.   MRN: [id number]  HPI:  Diabetes: can do insulin now because has refrigerator. Currently taking metformin 1000 mg BID, tolerating this. Last insulin dose used to be around 14 units of lantus daily. She does check her sugars. The highest she gets is around 290, lowest around 180.  Hypothyroidism - last took in December. Was on 341mcg pill but has been out of this medication for some time.   HTN - has been out of all her medications except quinapril and HCTZ, and states she didn't take quinapril this AM.   Rheumatoid arthritis - used to be on methotrexate for her RA, but was fired by Dr. Charlestine Night due to what sounds to be some kind of miscommunication issue. I subsequently referred her to Dr. Estanislado Pandy, but pt has not heard anything about the outcome of this referral. She is wary of going to [facility] for a rheumatologist as she [place] not drive on the highway and primarily relies on the bus for transportation. Over the last couple of months she has noticed increasing deformities of her hands.  ROS: See HPI.  [place]: hx DM, HTN, RA, hypothyroidism, GERD  PHYSICAL EXAM: BP 163/86 mmHg  Pulse 87  Temp(Src) 97.6 F (36.4 C) (Oral)  Ht 5\' 2"  (1.575 m)  Wt 136 lb (61.689 kg)  BMI 24.87 kg/m2 Gen: NAD, pleasant and cooperative HEENT: NCAT Heart: RRR no murmurs Lungs: CTAB NWOB Neuro: grossly nonfocal speech normal Ext: bilateral hands with significant interval worsening of ulnar deviation at the MCP joints. Diabetic foot exam: 2+ DP pulses bilat, decreased sensation with monofilament testing bilaterally. No lesions or significant calluses.   ASSESSMENT/PLAN:  DM (diabetes mellitus), type 2, uncontrolled A1c elevated at 12.7, which is expected as pt has not been on insulin during last several months. She is now agreeable to going back on insulin therapy. Will start with just lantus 10 units daily. Return in 3 weeks for uptitration  of lantus.  Cardiac: refill statin, continue aspirin Renal: on ACE Eye: discuss at next visit Foot: exam done today, shows decreased sensation in bilateral feet. Counseled pt to look at her feet daily to eval for any cuts or lesions. Immunizations: has already received pneumovax. Discuss flu shot at next appt.    Hypothyroidism Previously on high dose synthroid (359mcg daily) but has not taken in several months. Will restart synthroid at low dose (21mcg daily) to avoid unwanted cardiac side effects. F/u in 3 weeks for upward titration.   HYPERTENSION, BENIGN SYSTEMIC BP elevated but out of medications. Will refill quinapril, HCTZ, and amlodipine. F/u in 3 weeks for BP recheck.   Rheumatoid arthritis Significant worsening in ulnar deviation over last few months. Will enter new referral to Dr. Amil Amen to establish with rheumatology.    FOLLOW UP: F/u in 3 weeks for DM and hypothyroidism, for uptitration of lantus and synthroid.   [place]. [name], [place]

## 2014-10-14 ENCOUNTER — Other Ambulatory Visit: Payer: Self-pay | Admitting: Family Medicine

## 2014-10-15 NOTE — Assessment & Plan Note (Signed)
A1c elevated at 12.7, which is expected as pt has not been on insulin during last several months. She is now agreeable to going back on insulin therapy. Will start with just lantus 10 units daily. Return in 3 weeks for uptitration of lantus.  Cardiac: refill statin, continue aspirin Renal: on ACE Eye: discuss at next visit Foot: exam done today, shows decreased sensation in bilateral feet. Counseled pt to look at her feet daily to eval for any cuts or lesions. Immunizations: has already received pneumovax. Discuss flu shot at next appt.

## 2014-10-15 NOTE — Assessment & Plan Note (Signed)
Significant worsening in ulnar deviation over last few months. Will enter new referral to Dr. Amil Amen to establish with rheumatology.

## 2014-10-15 NOTE — Addendum Note (Signed)
Addended by: [name] on: 10/15/2014 03:43 PM   Modules accepted: Orders

## 2014-10-15 NOTE — Assessment & Plan Note (Signed)
BP elevated but out of medications. Will refill quinapril, HCTZ, and amlodipine. F/u in 3 weeks for BP recheck.

## 2014-10-15 NOTE — Assessment & Plan Note (Signed)
Previously on high dose synthroid (392mcg daily) but has not taken in several months. Will restart synthroid at low dose (22mcg daily) to avoid unwanted cardiac side effects. F/u in 3 weeks for upward titration.

## 2014-11-17 ENCOUNTER — Other Ambulatory Visit: Payer: Self-pay | Admitting: Family Medicine

## 2014-11-18 MED ORDER — ACCU-CHEK SOFTCLIX LANCETS MISC: Status: DC

## 2014-11-25 ENCOUNTER — Telehealth: Payer: Self-pay | Admitting: Family Medicine

## 2014-11-25 NOTE — Telephone Encounter (Signed)
Need to have a rx called to [facility] on [address] for an Accucheck compact and needles for her Lantus pen

## 2014-11-26 MED ORDER — ACCU-CHEK COMPACT PLUS CARE KIT: 1.0000 | PACK | Freq: Every day | Status: DC

## 2014-11-26 MED ORDER — INSULIN PEN NEEDLE 31G X 8 MM MISC: Status: DC

## 2014-11-26 MED ORDER — GLUCOSE BLOOD VI STRP: ORAL_STRIP | Status: DC

## 2014-11-26 NOTE — Telephone Encounter (Signed)
Covering for Dr [name]. Rx sent to pharmacy for patient.

## 2015-02-11 ENCOUNTER — Other Ambulatory Visit: Payer: Self-pay | Admitting: Family Medicine

## 2015-02-14 ENCOUNTER — Telehealth: Payer: Self-pay | Admitting: Family Medicine

## 2015-02-14 NOTE — Telephone Encounter (Signed)
Left message on voicemail informing that rx had been sent in and to schedule a follow up appointment before running out again.

## 2015-02-14 NOTE — Telephone Encounter (Signed)
To [facility] red team - please call pt and let her know I have refilled her synthroid but she needs to schedule an appointment to follow up on her hypothyroidism. Thanks!  [name], MD

## 2015-03-13 ENCOUNTER — Other Ambulatory Visit: Payer: Self-pay | Admitting: Family Medicine

## 2015-03-20 ENCOUNTER — Ambulatory Visit (INDEPENDENT_AMBULATORY_CARE_PROVIDER_SITE_OTHER): Payer: Medicare Other | Admitting: Family Medicine

## 2015-03-20 ENCOUNTER — Encounter: Payer: Self-pay | Admitting: Family Medicine

## 2015-03-20 VITALS — BP 132/86 | HR 87 | Temp 98.4°F | Ht 62.0 in | Wt 136.4 lb

## 2015-03-20 DIAGNOSIS — E1165 Type 2 diabetes mellitus with hyperglycemia: Secondary | ICD-10-CM

## 2015-03-20 DIAGNOSIS — IMO0002 Reserved for concepts with insufficient information to code with codable children: Secondary | ICD-10-CM

## 2015-03-20 DIAGNOSIS — R413 Other amnesia: Secondary | ICD-10-CM | POA: Diagnosis not present

## 2015-03-20 DIAGNOSIS — L609 Nail disorder, unspecified: Secondary | ICD-10-CM

## 2015-03-20 DIAGNOSIS — L602 Onychogryphosis: Secondary | ICD-10-CM

## 2015-03-20 DIAGNOSIS — M069 Rheumatoid arthritis, unspecified: Secondary | ICD-10-CM | POA: Diagnosis not present

## 2015-03-20 DIAGNOSIS — H539 Unspecified visual disturbance: Secondary | ICD-10-CM | POA: Diagnosis not present

## 2015-03-20 LAB — POCT GLYCOSYLATED HEMOGLOBIN (HGB A1C): HEMOGLOBIN A1C: 11

## 2015-03-20 MED ORDER — INSULIN GLARGINE 100 UNIT/ML SOLOSTAR PEN: 15.0000 [IU] | PEN_INJECTOR | Freq: Every day | SUBCUTANEOUS | Status: DC

## 2015-03-20 NOTE — Patient Instructions (Addendum)
Thank you for coming in today  DIABETES: Come back in in 2-3 weeks and increase your Lantus to 15 units per day VISION Problems: You have an eye appointment at 1:15 pm today at [facility] opthalmology. Located at [address], [place], [place] [address] ARTHRITIS: We are checking on the status of your referral and will let you know about this by phone FEET: We are referring you to a podiatrist you can expect a call from them.    Rheumatoid Arthritis Rheumatoid arthritis is a long-term (chronic) inflammatory disease that causes pain, swelling, and stiffness of the joints. It can affect the entire body, including the eyes and lungs. The effects of rheumatoid arthritis vary widely among those with the condition. CAUSES  The cause of rheumatoid arthritis is not known. It tends to run in families and is more common in women. Certain cells of the body's natural defense system (immune system) do not work properly and begin to attack healthy joints. It primarily involves the connective tissue that lines the joints (synovial membrane). This can cause damage to the joint. SYMPTOMS   Pain, stiffness, swelling, and decreased motion of many joints, especially in the hands and feet.  Stiffness that is worse in the morning. It may last 1-2 hours or longer.  Numbness and tingling in the hands.  Fatigue.  Loss of appetite.  Weight loss.  Low-grade fever.  Dry eyes and mouth.  Firm lumps (rheumatoid nodules) that grow beneath the skin in areas such as the elbows and hands. DIAGNOSIS  Diagnosis is based on the symptoms described, an exam, and blood tests. Sometimes, X-rays are helpful. TREATMENT  The goals of treatment are to relieve pain, reduce inflammation, and to slow down or stop joint damage and disability. Methods vary and may include:  Maintaining a balance of rest, exercise, and proper nutrition.  Medicines:  Pain relievers (analgesics).  Corticosteroids and nonsteroidal anti-inflammatory  drugs (NSAIDs) to reduce inflammation.  Disease-modifying antirheumatic drugs (DMARDs) to try to slow the course of the disease.  Biologic response modifiers to reduce inflammation and damage.  Physical therapy and occupational therapy.  Surgery for patients with severe joint damage. Joint replacement or fusing of joints may be needed.  Routine monitoring and ongoing care, such as office visits, blood and urine tests, and X-rays. HOME CARE INSTRUCTIONS   Remain physically active and reduce activity when the disease gets worse.  Eat a well-balanced diet.  Put heat on affected joints when you wake up and before activities. Keep the heat on the affected joint for as long as directed by your health care provider.  Put ice on affected joints following activities or exercising.  Put ice in a plastic bag.  Place a towel between your skin and the bag.  Leave the ice on for 15-20 minutes, 3-4 times per day, or as directed by your health care provider.  Take medicines and supplements only as directed by your health care provider.  Use splints as directed by your health care provider. Splints help maintain joint position and function.  Do not sleep with pillows under your knees. This may lead to spasms.  Participate in a self-management program to keep current with the latest treatment and coping skills. SEEK IMMEDIATE MEDICAL CARE IF:  You have fainting episodes.  You have periods of extreme weakness.  You rapidly develop a hot, painful joint that is more severe than usual joint aches.  You have chills.  You have a fever. FOR MORE INFORMATION  [employer]: www.rheumatology.[place]: www.arthritis.org Document Released: 08/02/2000 Document Revised: 12/20/2013 Document Reviewed: 09/11/2011 [facility] Patient Information 2015 [place], [place]. This information is not intended to replace advice given to you by your health care provider. Make  sure you discuss any questions you have with your health care provider.  Diabetes Mellitus and Food It is important for you to manage your blood sugar (glucose) level. Your blood glucose level can be greatly affected by what you eat. Eating healthier foods in the appropriate amounts throughout the day at about the same time each day will help you control your blood glucose level. It can also help slow or prevent worsening of your diabetes mellitus. Healthy eating may even help you improve the level of your blood pressure and reach or maintain a healthy weight.  HOW CAN FOOD AFFECT ME? Carbohydrates Carbohydrates affect your blood glucose level more than any other type of food. Your dietitian will help you determine how many carbohydrates to eat at each meal and teach you how to count carbohydrates. Counting carbohydrates is important to keep your blood glucose at a healthy level, especially if you are using insulin or taking certain medicines for diabetes mellitus. Alcohol Alcohol can cause sudden decreases in blood glucose (hypoglycemia), especially if you use insulin or take certain medicines for diabetes mellitus. Hypoglycemia can be a life-threatening condition. Symptoms of hypoglycemia (sleepiness, dizziness, and disorientation) are similar to symptoms of having too much alcohol.  If your health care provider has given you approval to drink alcohol, do so in moderation and use the following guidelines:  Women should not have more than one drink per day, and men should not have more than two drinks per day. One drink is equal to:  12 oz of beer.  5 oz of wine.  1 oz of hard liquor.  Do not drink on an empty stomach.  Keep yourself hydrated. Have water, diet soda, or unsweetened iced tea.  Regular soda, juice, and other mixers might contain a lot of carbohydrates and should be counted. WHAT FOODS ARE NOT RECOMMENDED? As you make food choices, it is important to remember that all foods  are not the same. Some foods have fewer nutrients per serving than other foods, even though they might have the same number of calories or carbohydrates. It is difficult to get your body what it needs when you eat foods with fewer nutrients. Examples of foods that you should avoid that are high in calories and carbohydrates but low in nutrients include:  Trans fats (most processed foods list trans fats on the Nutrition Facts label).  Regular soda.  Juice.  Candy.  Sweets, such as cake, pie, doughnuts, and cookies.  Fried foods. WHAT FOODS CAN I EAT? Have nutrient-rich foods, which will nourish your body and keep you healthy. The food you should eat also will depend on several factors, including:  The calories you need.  The medicines you take.  Your weight.  Your blood glucose level.  Your blood pressure level.  Your cholesterol level. You also should eat a variety of foods, including:  Protein, such as meat, poultry, fish, tofu, nuts, and seeds (lean animal proteins are best).  Fruits.  Vegetables.  Dairy products, such as milk, cheese, and yogurt (low fat is best).  Breads, grains, pasta, cereal, rice, and beans.  Fats such as olive oil, trans fat-free margarine, canola oil, avocado, and olives. DOES EVERYONE WITH DIABETES MELLITUS HAVE THE SAME MEAL PLAN? Because every person  with diabetes mellitus is different, there is not one meal plan that works for everyone. It is very important that you meet with a dietitian who will help you create a meal plan that is just right for you. Document Released: 05/02/2005 Document Revised: 08/10/2013 Document Reviewed: 07/02/2013 [facility] Patient Information 2015 [place], [place]. This information is not intended to replace advice given to you by your health care provider. Make sure you discuss any questions you have with your health care provider.

## 2015-03-20 NOTE — Assessment & Plan Note (Signed)
Increasing vision disturbances in the past month, in patient who has been told in the past she may have detached retina. Discussed importance of seeking medical care promptly with any eye complaint. We were able to call and get her an acute visit appointment with her eye doctor for this afternoon so that she can be evaluated promptly. Pt given instructions for this appointment, including printed map and directions for how to get there.

## 2015-03-20 NOTE — Assessment & Plan Note (Signed)
Continues with worsening ulnar deviation, now impacting her ADL's. It appears we did fax her referral info to rheumatologist office back in February. Will ask referral team to look into this since pt has not heard about appointment. She REALLY needs to get in with rheumatology in order to prevent further deterioration of her hand function.

## 2015-03-20 NOTE — Assessment & Plan Note (Signed)
A1c today 11, improved from prior. Fasting CBG's not controlled. Will increase lantus to 15 units daily. Pt instructed to f/u with me in 2 weeks for further titration fo lantus. Stressed importance of regular follow up in order to get her diabetes under control.

## 2015-03-20 NOTE — Progress Notes (Addendum)
Patient ID: [name], female   DOB: [birth year], 69 y.o.   MRN: [id number]  HPI:  Arthritis - hx of RA. Has pain in hands and ankle. Getting worse, noticing increased ulnar deviation. Hard for her to perform daily tasks due to pain in her hands. No fevers or myalgias. Previously was on methotrexate with improvement, but is no longer on this after being fired from prior rheumatology practice. Has nodules on elbows. I referred her back to rheumatology (Dr. Conley Canal) in February but she has not heard about this appointment.  DM - taking lantus 10 units daily. No low blood sugars. Does not use mealtime insulin. Checks sugars BID and gets 170-300. Not taking metformin regularly due to nausea. No CP or SOB. Has long toenails and needs help with cutting them due to her RA.  Pink vision - occurs every morning and goes away with time, have occurred in the past month. Last eye appointment was 1 year ago and was told she might have a retinal detachment. Has transient spots of blackness in her vision. Also sees white structures on corner of her vision. Has not scheduled an appointment with her eye doctor for these concerns. Hx laser therapy and cataract surgery in the past. Feels like she's seeing okay right now. R eye seems the primarily affected eye.  ROS: See HPI.  [place]: poorly controlled DM, peripheral neuropathy, HTN, hypothyroidism, rheumatoid arthritis  PHYSICAL EXAM: BP 156/80 mmHg  Pulse 87  Temp(Src) 98.4 F (36.9 C) (Oral)  Ht 5\' 2"  (1.575 m)  Wt 136 lb 7 oz (61.888 kg)  BMI 24.95 kg/m2  Visual acuity: L 20/30  R 20/50  B 20/25  Gen: NAD, pleasant, cooperative HEENT: NCAT. EOMI. PERRL. Funduscopic exam attempted on undilated eyes but no abnormalities seen. Neuro: grossly nonfocal speech normal Ext: hands with marked ulnar deviation at MCP joint bilaterally. Swelling to MCP joints bilaterally. Firm nodules on elbows bilaterally. Feet with long toenails (no signs of infection on  toenails)  ASSESSMENT/PLAN:  Vision disturbance Increasing vision disturbances in the past month, in patient who has been told in the past she may have detached retina. Discussed importance of seeking medical care promptly with any eye complaint. We were able to call and get her an acute visit appointment with her eye doctor for this afternoon so that she can be evaluated promptly. Pt given instructions for this appointment, including printed map and directions for how to get there.  Memory deficit Subtle items in patient's history lead me to some concern about her memory. Her last appointment was six months ago, and I asked her to return in 3 weeks for her next appt. When I pointed out that it had been 6 mos since that visit, she seemed shocked that this much time had gone by. She was dismissed from her prior rheumatology clinic due to forgetting to schedule a lab visit. At next visit, will plan to perform MMSE or MOCA in order to more objectively evaluate her working memory.  DM (diabetes mellitus), type 2, uncontrolled A1c today 11, improved from prior. Fasting CBG's not controlled. Will increase lantus to 15 units daily. Pt instructed to f/u with me in 2 weeks for further titration fo lantus. Stressed importance of regular follow up in order to get her diabetes under control.  Rheumatoid arthritis Continues with worsening ulnar deviation, now impacting her ADL's. It appears we did fax her referral info to rheumatologist office back in February. Will ask referral team to look into  this since pt has not heard about appointment. She REALLY needs to get in with rheumatology in order to prevent further deterioration of her hand function.  Refer to podiatry for toenail care  FOLLOW UP: F/u in 2 weeks for DM. **needs MMSE/MOCA at that visit** Ophtho appointment today Referral to podiatry Checking on referral status to rheumatology  [place]. [name], [place]

## 2015-03-20 NOTE — Assessment & Plan Note (Signed)
Subtle items in patient's history lead me to some concern about her memory. Her last appointment was six months ago, and I asked her to return in 3 weeks for her next appt. When I pointed out that it had been 6 mos since that visit, she seemed shocked that this much time had gone by. She was dismissed from her prior rheumatology clinic due to forgetting to schedule a lab visit. At next visit, will plan to perform MMSE or MOCA in order to more objectively evaluate her working memory.

## 2015-03-22 ENCOUNTER — Other Ambulatory Visit: Payer: Self-pay | Admitting: Family Medicine

## 2015-03-22 DIAGNOSIS — M069 Rheumatoid arthritis, unspecified: Secondary | ICD-10-CM

## 2015-03-22 NOTE — Addendum Note (Signed)
Addended by: [name] on: 03/22/2015 12:20 PM   Modules accepted: Orders

## 2015-04-07 ENCOUNTER — Ambulatory Visit: Payer: Self-pay | Admitting: Podiatry

## 2015-04-26 ENCOUNTER — Ambulatory Visit: Payer: Self-pay | Admitting: Podiatry

## 2015-04-26 ENCOUNTER — Ambulatory Visit (INDEPENDENT_AMBULATORY_CARE_PROVIDER_SITE_OTHER): Payer: Medicare Other | Admitting: Podiatry

## 2015-04-26 ENCOUNTER — Encounter: Payer: Self-pay | Admitting: Podiatry

## 2015-04-26 VITALS — BP 89/47 | HR 97 | Resp 16

## 2015-04-26 DIAGNOSIS — M79673 Pain in unspecified foot: Secondary | ICD-10-CM | POA: Diagnosis not present

## 2015-04-26 DIAGNOSIS — B351 Tinea unguium: Secondary | ICD-10-CM | POA: Diagnosis not present

## 2015-04-26 DIAGNOSIS — M79609 Pain in unspecified limb: Principal | ICD-10-CM

## 2015-04-26 NOTE — Progress Notes (Signed)
Patient ID: [name], female   DOB: [birth year], 69 y.o.   MRN: [id number] Complaint:  Visit Type: Patient presents  to my office for  preventative foot care services. Complaint: Patient states" my nails have grown long and thick and become painful to walk and wear shoes" Patient has been diagnosed with DM with no foot complications. The patient presents for preventative foot care services. No changes to ROS  Podiatric Exam: Vascular: dorsalis pedis and posterior tibial pulses are not  palpable bilateral due to swollen feet/legs. Capillary return is immediate. Temperature gradient is WNL.   Sensorium: Normal Semmes Weinstein monofilament test. Normal tactile sensation bilaterally. Nail Exam: Pt has thick disfigured discolored nails with subungual debris noted bilateral entire nail hallux through second toes both feet.; Ulcer Exam: There is no evidence of ulcer or pre-ulcerative changes or infection. Orthopedic Exam: Muscle tone and strength are WNL. No limitations in general ROM. No crepitus or effusions noted. Foot type and digits show no abnormalities. Bony prominences are unremarkable. Skin: No Porokeratosis. No infection or ulcers  Diagnosis:  Onychomycosis, , Pain in right toe, pain in left toes  Treatment & Plan Procedures and Treatment: Consent by patient was obtained for treatment procedures. The patient understood the discussion of treatment and procedures well. All questions were answered thoroughly reviewed. Debridement of mycotic and hypertrophic toenails, 1 through 5 bilateral and clearing of subungual debris. No ulceration, no infection noted.  Return Visit-Office Procedure: Patient instructed to return to the office for a follow up visit 3 months for continued evaluation and treatment.

## 2015-04-27 ENCOUNTER — Ambulatory Visit: Payer: Self-pay | Admitting: Podiatry

## 2015-06-23 ENCOUNTER — Other Ambulatory Visit: Payer: Self-pay | Admitting: Family Medicine

## 2015-06-27 ENCOUNTER — Other Ambulatory Visit: Payer: Self-pay | Admitting: Family Medicine

## 2015-06-27 MED ORDER — LEVOTHYROXINE SODIUM 50 MCG PO TABS: 50.0000 ug | ORAL_TABLET | Freq: Every day | ORAL | Status: DC

## 2015-06-27 MED ORDER — CETIRIZINE HCL 10 MG PO TABS: 10.0000 mg | ORAL_TABLET | Freq: Every day | ORAL | Status: DC

## 2015-06-28 ENCOUNTER — Ambulatory Visit: Payer: Medicare Other | Admitting: Family Medicine

## 2015-06-30 ENCOUNTER — Ambulatory Visit
Admission: RE | Admit: 2015-06-30 | Discharge: 2015-06-30 | Disposition: A | Discharge status: Home / Self Care | Payer: Medicare Other | Source: Ambulatory Visit | Attending: Family Medicine | Admitting: Family Medicine

## 2015-06-30 ENCOUNTER — Ambulatory Visit (INDEPENDENT_AMBULATORY_CARE_PROVIDER_SITE_OTHER): Payer: Medicare Other | Admitting: Family Medicine

## 2015-06-30 VITALS — BP 174/85 | HR 95 | Temp 98.4°F | Wt 135.0 lb

## 2015-06-30 DIAGNOSIS — J029 Acute pharyngitis, unspecified: Secondary | ICD-10-CM

## 2015-06-30 DIAGNOSIS — IMO0002 Reserved for concepts with insufficient information to code with codable children: Secondary | ICD-10-CM

## 2015-06-30 DIAGNOSIS — E1121 Type 2 diabetes mellitus with diabetic nephropathy: Secondary | ICD-10-CM | POA: Diagnosis not present

## 2015-06-30 DIAGNOSIS — R05 Cough: Secondary | ICD-10-CM | POA: Diagnosis not present

## 2015-06-30 DIAGNOSIS — J189 Pneumonia, unspecified organism: Secondary | ICD-10-CM | POA: Diagnosis not present

## 2015-06-30 DIAGNOSIS — E1165 Type 2 diabetes mellitus with hyperglycemia: Secondary | ICD-10-CM

## 2015-06-30 DIAGNOSIS — R059 Cough, unspecified: Secondary | ICD-10-CM

## 2015-06-30 LAB — POCT GLYCOSYLATED HEMOGLOBIN (HGB A1C): Hemoglobin A1C: 9.9

## 2015-06-30 LAB — POCT RAPID STREP A (OFFICE): RAPID STREP A SCREEN: NEGATIVE

## 2015-06-30 MED ORDER — LEVOFLOXACIN 500 MG PO TABS: 500.0000 mg | ORAL_TABLET | Freq: Every day | ORAL | Status: DC

## 2015-06-30 NOTE — Patient Instructions (Signed)
Take levaquin 500mg  daily for 7 days Go get chest xray  Come in on Monday to be sure you're getting better Go to ER or Urgent Care if worsening this weekend, or if unable to eat/drink well.  Be well, Dr. [name]

## 2015-07-02 NOTE — Progress Notes (Addendum)
Date of Visit: 06/30/2015   HPI:  Patient presents today for a visit that had been scheduled for routine follow up, but we elected to discuss the acute symptoms she's been having.  Has had cough, sore throat, fatigue, sinus pain and pressure for about 3 weeks. Grandson had strep throat. No body aches, nausea, vomiting, or known fever. Eating and drinking well. Notes some pain on the L side of her back. Cough worse at night and in the morning. Recently started zyrtec OTC with some improvement in sinus pressure.   ROS: See HPI.  [place]: history of hypothyroidism, type 2 diabetes, hypertension, rheumatoid arthritis (has upcoming appointment with rheum, missed her last scheduled appointment)  PHYSICAL EXAM: BP 174/85 mmHg  Pulse 95  Temp(Src) 98.4 F (36.9 C) (Oral)  Wt 135 lb (61.236 kg)  SpO2 99% Gen: NAD, appears as though does not feel well HEENT: normocephalic, atraumatic. tympanic membrane's clear bilaterally. Nares with some congestion. No sinus tenderness. oropharynx mildly erythematous without exudate.  Heart: regular rate and rhythm no murmur Lungs: normal work of breathing. Coughs intermittently. + crackles LLL up to mid L lung. R lung clear.  Neuro: alert, speech normal, grossly nonfocal Ext: atraumatic   ASSESSMENT/PLAN:  69 yo F presenting with several weeks of fatigue, cough, sore throat, and sinus symptoms. With exposure to strep, obtained rapid strep which was negative. Lack of fever and vomiting decreases suspicion for influenza, but + crackles on LLL significantly heighten concern for community acquired pneumonia. Patient is at risk for complicated pneumonia given her age (69) and comorbid uncontrolled diabetes. -obtain CXR -treat empirically with levaquin -follow up on Monday, sooner if worsening. Return precautions discussed  Breast "pulling" - normal breast and axillary exam today bilaterally. Given handout on mammogram, past due for screening mammogram.   FOLLOW  UP: F/u in 3 days for pneumonia  [place] J. [name], [place]

## 2015-07-03 ENCOUNTER — Encounter: Payer: Self-pay | Admitting: Family Medicine

## 2015-07-03 ENCOUNTER — Ambulatory Visit (INDEPENDENT_AMBULATORY_CARE_PROVIDER_SITE_OTHER): Payer: Medicare Other | Admitting: Family Medicine

## 2015-07-03 VITALS — BP 137/72 | HR 104 | Temp 98.5°F | Wt 136.0 lb

## 2015-07-03 DIAGNOSIS — E1165 Type 2 diabetes mellitus with hyperglycemia: Secondary | ICD-10-CM

## 2015-07-03 DIAGNOSIS — E118 Type 2 diabetes mellitus with unspecified complications: Secondary | ICD-10-CM | POA: Diagnosis not present

## 2015-07-03 DIAGNOSIS — IMO0002 Reserved for concepts with insufficient information to code with codable children: Secondary | ICD-10-CM

## 2015-07-03 DIAGNOSIS — E039 Hypothyroidism, unspecified: Secondary | ICD-10-CM

## 2015-07-03 DIAGNOSIS — J189 Pneumonia, unspecified organism: Secondary | ICD-10-CM | POA: Diagnosis not present

## 2015-07-03 DIAGNOSIS — Z1159 Encounter for screening for other viral diseases: Secondary | ICD-10-CM

## 2015-07-03 DIAGNOSIS — I10 Essential (primary) hypertension: Secondary | ICD-10-CM

## 2015-07-03 DIAGNOSIS — Z794 Long term (current) use of insulin: Secondary | ICD-10-CM

## 2015-07-03 MED ORDER — MEDICAL COMPRESSION STOCKINGS MISC: Status: DC

## 2015-07-03 NOTE — Progress Notes (Signed)
Date of Visit: 07/03/2015   HPI:  Pneumonia - seen on Friday and dx'd with CAP. CXR confirmed this diagnosis. Has been taking levaquin and is now feeling a lot better. Not all the way back to normal, but better.  Diabetes - taking insulin 10 units each morning. Checks sugars 3x/day but did not bring log with her today. Last eye exam was a few months ago. Denies CP or shortness of breath.   ROS: See HPI.  [place]: history of GERD, obesity, osteopenia, diabetic retinopathy, diabetic peripheral neuropathy, hypertension, hypothyroidism, rheumatoid arthritis, type 2 diabetes  PHYSICAL EXAM: BP 137/72 mmHg  Pulse 104  Temp(Src) 98.5 F (36.9 C) (Oral)  Wt 136 lb (61.689 kg)  SpO2 100% Gen: NAD, pleasant, cooperative HEENT: NCAT Lungs: normal work of breathing  Neuro: grossly nonfocal, speech normal Ext: atraumatic  ASSESSMENT/PLAN:  Health maintenance:  -hep C screening antibody -hand out on tetanus vaccine -handout given on mammogram -handout given on colonoscopy  DM (diabetes mellitus), type 2, uncontrolled A1c improved when checked on Friday (9.9 down from 11.0 3 months ago). Unable to titrate insulin today as patient did not bring sugar log and could not confidently recall the numbers. She will call us with her blood sugar readings and we'll adjust over the phone.  Cardiac: on statin, aspirin. Return for fasting labs (lipids, CMET, etc) Renal: on ACE Eye: asked patient to have eye doctor's office send records Foot: UTD Immunizations: handout given on tetanus vaccine. Address other vaccines at later time.   HYPERTENSION, BENIGN SYSTEMIC Well controlled. Continue current regimen. Check labs at lab visit.  Hypothyroidism Check TSH with labs, to return for fasting lab appointment.    FOLLOW UP: Follow up by phone to adjust insulin, next office visit in max of 3 months   [place] J. [name], [place]

## 2015-07-03 NOTE — Patient Instructions (Signed)
On your way out, schedule an appointment one morning to come back for fasting labs. Do not eat or drink anything other than water the morning of your lab appointment until after your labs are drawn.  Call with your fasting sugars  See handout on mammogram, colonoscopy, tetanus shot  Be well, Dr. [name]

## 2015-07-04 NOTE — Assessment & Plan Note (Addendum)
A1c improved when checked on Friday (9.9 down from 11.0 3 months ago). Unable to titrate insulin today as patient did not bring sugar log and could not confidently recall the numbers. She will call us with her blood sugar readings and we'll adjust over the phone.  Cardiac: on statin, aspirin. Return for fasting labs (lipids, CMET, etc) Renal: on ACE Eye: asked patient to have eye doctor's office send records Foot: UTD Immunizations: handout given on tetanus vaccine. Address other vaccines at later time.

## 2015-07-04 NOTE — Assessment & Plan Note (Signed)
Well controlled. Continue current regimen. Check labs at lab visit.

## 2015-07-04 NOTE — Assessment & Plan Note (Signed)
Check TSH with labs, to return for fasting lab appointment.

## 2015-07-26 ENCOUNTER — Telehealth: Payer: Self-pay | Admitting: Hematology

## 2015-07-26 NOTE — Telephone Encounter (Signed)
S/W PT IN REF TO NP APPT 08/28/15@10 :30 REFERRING DR Dossie Der DX-ANEMIA

## 2015-07-27 ENCOUNTER — Ambulatory Visit: Payer: Medicare Other | Admitting: Podiatry

## 2015-08-02 ENCOUNTER — Ambulatory Visit: Payer: Medicare Other | Admitting: Podiatry

## 2015-08-09 ENCOUNTER — Encounter: Payer: Self-pay | Admitting: Podiatry

## 2015-08-09 ENCOUNTER — Ambulatory Visit (INDEPENDENT_AMBULATORY_CARE_PROVIDER_SITE_OTHER): Payer: Medicare Other | Admitting: Podiatry

## 2015-08-09 DIAGNOSIS — B351 Tinea unguium: Secondary | ICD-10-CM | POA: Diagnosis not present

## 2015-08-09 DIAGNOSIS — M79673 Pain in unspecified foot: Secondary | ICD-10-CM

## 2015-08-09 DIAGNOSIS — M79609 Pain in unspecified limb: Principal | ICD-10-CM

## 2015-08-09 NOTE — Progress Notes (Signed)
Patient ID: QUINTARA JUERGENSEN, female   DOB: 04/02/1946, 69 y.o.   MRN: [id number] Complaint:  Visit Type: Patient presents  to my office for  preventative foot care services. Complaint: Patient states" my nails have grown long and thick and become painful to walk and wear shoes" Patient has been diagnosed with DM with no foot complications. The patient presents for preventative foot care services. No changes to ROS  Podiatric Exam: Vascular: dorsalis pedis and posterior tibial pulses are not  palpable bilateral due to swollen feet/legs. Capillary return is immediate. Temperature gradient is WNL.   Sensorium: Normal Semmes Weinstein monofilament test. Normal tactile sensation bilaterally. Nail Exam: Pt has thick disfigured discolored nails with subungual debris noted bilateral entire nail hallux through second toes both feet.; Ulcer Exam: There is no evidence of ulcer or pre-ulcerative changes or infection. Orthopedic Exam: Muscle tone and strength are WNL. No limitations in general ROM. No crepitus or effusions noted. Foot type and digits show no abnormalities. Bony prominences are unremarkable. Skin: No Porokeratosis. No infection or ulcers  Diagnosis:  Onychomycosis, , Pain in right toe, pain in left toes  Treatment & Plan Procedures and Treatment: Consent by patient was obtained for treatment procedures. The patient understood the discussion of treatment and procedures well. All questions were answered thoroughly reviewed. Debridement of mycotic and hypertrophic toenails, 1 through 5 bilateral and clearing of subungual debris. No ulceration, no infection noted.  Return Visit-Office Procedure: Patient instructed to return to the office for a follow up visit 3 months for continued evaluation and treatment.   Gardiner Barefoot DPM

## 2015-08-28 ENCOUNTER — Ambulatory Visit: Payer: Medicare Other | Admitting: Hematology

## 2015-09-23 ENCOUNTER — Other Ambulatory Visit: Payer: Self-pay | Admitting: Family Medicine

## 2015-10-12 ENCOUNTER — Telehealth: Payer: Self-pay | Admitting: *Deleted

## 2015-10-12 NOTE — Telephone Encounter (Signed)
Called patient to offer flu vaccine, however, VM picked up.  Left message on patient's voice mail to return call. Ellene Bloodsaw L, RN   

## 2015-11-08 ENCOUNTER — Ambulatory Visit: Payer: Medicare Other | Admitting: Podiatry

## 2015-12-03 ENCOUNTER — Ambulatory Visit (HOSPITAL_COMMUNITY)
Admission: EM | Admit: 2015-12-03 | Discharge: 2015-12-03 | Disposition: A | Discharge status: Home / Self Care | Payer: Medicare Other | Attending: Family Medicine | Admitting: Family Medicine

## 2015-12-03 ENCOUNTER — Encounter (HOSPITAL_COMMUNITY): Payer: Self-pay | Admitting: *Deleted

## 2015-12-03 DIAGNOSIS — E11621 Type 2 diabetes mellitus with foot ulcer: Secondary | ICD-10-CM | POA: Diagnosis not present

## 2015-12-03 DIAGNOSIS — L97501 Non-pressure chronic ulcer of other part of unspecified foot limited to breakdown of skin: Secondary | ICD-10-CM | POA: Diagnosis not present

## 2015-12-03 MED ORDER — CIPROFLOXACIN HCL 500 MG PO TABS: 500.0000 mg | ORAL_TABLET | Freq: Two times a day (BID) | ORAL | Status: DC

## 2015-12-03 NOTE — ED Notes (Addendum)
Pt  Reports   Noticed  A  Blister  Side  Of  l  Big  Toe     X  5  Days        Getting  Worse  Pt is  A  Diabetic       Now  The  Wound  Is   Draining  Swollen  With  Pus  Present

## 2015-12-03 NOTE — ED Provider Notes (Signed)
CSN: [id number]     Arrival date & time 12/03/15  1450 History   First MD Initiated Contact with Patient 12/03/15 1511     Chief Complaint  Patient presents with  . Toe Pain   (Consider location/radiation/quality/duration/timing/severity/associated sxs/prior Treatment) Patient is a 70 y.o. female presenting with toe pain. The history is provided by the patient.  Toe Pain This is a new problem. The current episode started more than 2 days ago (sx since mon.). The problem has been gradually worsening. Associated symptoms comments: Blister from new sandal to left gt toe, min soreness, sl sts no erythema or drainage..    Past Medical History  Diagnosis Date  . Diabetes mellitus age 31  . Retinopathy due to secondary diabetes mellitus ([place])     L>R, laser 3/07  . Retinal detachment, old, partial     left  . Schizotypal personality disorder   . Hypertension   . Thyroid disease    Past Surgical History  Procedure Laterality Date  . Thyroid radiation ablation      for Graves Disease  . Transthoracic echocardiogram       EF55-65%, nml - 12/14/2004  . Breast excisional biopsy    . Rotator cuff repair  1990's    left  . Breast surgery    . Cataract extraction      left    Family History  Problem Relation Age of Onset  . Heart disease Mother   . Hypertension Mother   . Lymphoma Sister   . Breast cancer Other   . Colon cancer Brother   . Colon cancer Maternal Grandmother    Social History  Substance Use Topics  . Smoking status: Never Smoker   . Smokeless tobacco: Never Used  . Alcohol Use: No   OB History    No data available     Review of Systems  Constitutional: Negative.   Musculoskeletal: Positive for joint swelling. Negative for gait problem.  Skin: Positive for rash and wound.  All other systems reviewed and are negative.   Allergies  Rosiglitazone maleate  Home Medications   Prior to Admission medications   Medication Sig Start Date End Date Taking?  Authorizing Provider  ACCU-CHEK SOFTCLIX LANCETS lancets USE three times daily AS DIRECTED 11/18/14   [name], MD  amLODipine (NORVASC) 10 MG tablet TAKE 1 TABLET BY MOUTH EVERY DAY 03/13/15   [name], MD  aspirin EC 325 MG tablet TAKE 1 TABLET BY MOUTH EVERY DAY 08/18/13   [name], MD  Blood Glucose Monitoring Suppl (ACCU-CHEK COMPACT CARE KIT) KIT 1 Device by Does not apply route daily. 11/26/14   Leone Haven, MD  Blood Glucose Monitoring Suppl (PRODIGY BLOOD GLUCOSE MONITOR) DEVI Check glucose 4 x daily 10/16/12   Jacquelyn A McGill, MD  cetirizine (ZYRTEC) 10 MG tablet Take 1 tablet (10 mg total) by mouth daily. 06/27/15   [name], MD  ciprofloxacin (CIPRO) 500 MG tablet Take 1 tablet (500 mg total) by mouth 2 (two) times daily. 12/03/15   Billy Fischer, MD  Continuous Glucose Monitor Sup MISC 1 Device by Does not apply route 3 (three) times daily. 12/06/11   Jacquelyn A McGill, MD  [employer] FOLIC ACID 973 MCG tablet TAKE 1 TABLET BY MOUTH EVERY DAY 05/04/11   Darl Pikes, MD  Elastic Bandages & Supports (MEDICAL COMPRESSION STOCKINGS) MISC Knee high bilateral 20-25mHg compression stockings. Wear daily. Dispense one pair. 07/03/15   BDelorse Limber  [name], MD  fluticasone (FLONASE) 50 MCG/ACT nasal spray PLACE 1 SPRAY INTO BOTH NOSTRILS DAILY. 06/27/15   [name], MD  furosemide (LASIX) 20 MG tablet Take 1 tablet (20 mg total) by mouth daily. For 2 weeks 06/01/14   [name], MD  glucose blood (ACCU-CHEK COMPACT PLUS) test strip USE TO CHECK BLOOD SUGAR THREE TIMES DAILY 11/26/14   Leone Haven, MD  hydrochlorothiazide (HYDRODIURIL) 25 MG tablet TAKE 1 TABLET BY MOUTH EVERY DAY 03/13/15   [name], MD  Insulin Glargine (LANTUS SOLOSTAR) 100 UNIT/ML Solostar Pen Inject 15 Units into the skin daily. 03/20/15   [name], MD  insulin lispro (HUMALOG KWIKPEN) 100 UNIT/ML KiwkPen Inject 0.03 mLs (3 Units total) into  the skin 3 (three) times daily with meals. Dispense qs for 1 month supply 04/20/14   [name], MD  Insulin Pen Needle (B-D ULTRAFINE III SHORT PEN) 31G X 8 MM MISC USE AS DIRECTED FOR DAILY INJECTIONS 11/26/14   Leone Haven, MD  levofloxacin (LEVAQUIN) 500 MG tablet Take 1 tablet (500 mg total) by mouth daily. 06/30/15   [name], MD  levothyroxine (SYNTHROID, LEVOTHROID) 50 MCG tablet TAKE 1 TABLET BY MOUTH EVERY DAY 09/25/15   Alveda Reasons, MD  metFORMIN (GLUCOPHAGE) 1000 MG tablet Take 1 tablet (1,000 mg total) by mouth 2 (two) times daily with a meal. 03/13/15   [name], MD  methotrexate [facility]) 2.5 MG tablet 5 tablets by mouth every Monday and Tuesday 02/05/11   Jacquelyn A McGill, MD  Multiple Vitamin (MULTIVITAMINS PO) 1 tablet by mouth daily     Historical Provider, MD  polyethylene glycol powder (GLYCOLAX/MIRALAX) powder Take 17 g by mouth daily as needed (constipation). 03/18/13   [name], MD  predniSONE (DELTASONE) 5 MG tablet Take 5 mg by mouth daily. PRN   Pain - takes for foot pain.   Takes 2x per week. 04/06/12   Historical Provider, MD  PROAIR HFA 108 (90 BASE) MCG/ACT inhaler INHALE 2 PUFFS INTO THE LUNGS EVERY 6 HOURS AS NEEDED FOR WHEEZING OR SHORTNESS OF BREATH 06/27/15   [name], MD  quinapril (ACCUPRIL) 5 MG tablet TAKE 1 TABLET BY MOUTH EVERY DAY 10/11/14   [name], MD  rosuvastatin (CRESTOR) 20 MG tablet Take 1 tablet (20 mg total) by mouth daily. 10/11/14   [name], MD   Meds Ordered and Administered this Visit  Medications - No data to display  BP 158/89 mmHg  Pulse 100  Temp(Src) 99.1 F (37.3 C) (Oral)  Resp 18  SpO2 98% No data found.   Physical Exam  Constitutional: She is oriented to person, place, and time. She appears well-developed and well-nourished.  Musculoskeletal: She exhibits no tenderness.  Neurological: She is alert and oriented to person, place, and time.  Skin:  Skin is warm and dry. No erythema.  Open blister to plantar aspect of left gt toe, no drainage, no erythema  Nursing note and vitals reviewed.   ED Course  Procedures (including critical care time)  Labs Review Labs Reviewed - No data to display  Imaging Review No results found.   Visual Acuity Review  Right Eye Distance:   Left Eye Distance:   Bilateral Distance:    Right Eye Near:   Left Eye Near:    Bilateral Near:         MDM   1. Diabetic ulcer of foot, limited to breakdown  of skin [facility])        Billy Fischer, MD 12/04/15 2154

## 2015-12-03 NOTE — Discharge Instructions (Signed)
Soak twice a day and take antibiotic, see your doctor on wed  For recheck.

## 2015-12-03 NOTE — ED Notes (Signed)
Patient's left foot placed in a betadine / NaCl solution per MDs verbal order.

## 2015-12-11 ENCOUNTER — Ambulatory Visit (INDEPENDENT_AMBULATORY_CARE_PROVIDER_SITE_OTHER): Payer: Medicare Other | Admitting: Family Medicine

## 2015-12-11 ENCOUNTER — Encounter: Payer: Self-pay | Admitting: Family Medicine

## 2015-12-11 VITALS — BP 158/79 | HR 82 | Temp 97.7°F | Ht 62.0 in | Wt 144.2 lb

## 2015-12-11 DIAGNOSIS — Z794 Long term (current) use of insulin: Secondary | ICD-10-CM

## 2015-12-11 DIAGNOSIS — E11621 Type 2 diabetes mellitus with foot ulcer: Secondary | ICD-10-CM | POA: Diagnosis not present

## 2015-12-11 DIAGNOSIS — E1165 Type 2 diabetes mellitus with hyperglycemia: Secondary | ICD-10-CM

## 2015-12-11 DIAGNOSIS — Z1159 Encounter for screening for other viral diseases: Secondary | ICD-10-CM

## 2015-12-11 DIAGNOSIS — E118 Type 2 diabetes mellitus with unspecified complications: Secondary | ICD-10-CM | POA: Diagnosis not present

## 2015-12-11 DIAGNOSIS — IMO0002 Reserved for concepts with insufficient information to code with codable children: Secondary | ICD-10-CM

## 2015-12-11 DIAGNOSIS — L97509 Non-pressure chronic ulcer of other part of unspecified foot with unspecified severity: Secondary | ICD-10-CM

## 2015-12-11 LAB — POCT GLYCOSYLATED HEMOGLOBIN (HGB A1C): Hemoglobin A1C: 13.7

## 2015-12-11 NOTE — Progress Notes (Signed)
Date of Visit: 12/11/2015   HPI:  Patient presents to follow up on toe ulcer. She was seen at Urgent Care on 12/03/15 for blister of toe. Given rx for ciprofloxacin at that visit.  Thinks toe is doing some better since then. Has been compliant with ciprofloxacin. The wound is not painful. Denies fever. Eating and drinking well. Stooling and urinating normally. Ulcer present for a total of 3 weeks.   Diabetes has been historically very uncontrolled. A1c today is 13.7. She takes lantus 10 units in the morning (despite previously being instructed to increase to 15). Reports fasting sugars are running about 180s in the morning. CBGs other times in 200s. She is on chronic prednisone 10mg  daily for her rheumatoid arthritis.  ROS: See HPI.  [place]: poorly controlled type 2 diabetes, obesity, osteopenia, GERD, diabetic retinopathy, diabetic neuropathy, rheumatoid arthritis, hypothyroidism, hypertension  PHYSICAL EXAM: BP 158/79 mmHg  Pulse 82  Temp(Src) 97.7 F (36.5 C) (Oral)  Ht 5\' 2"  (1.575 m)  Wt 144 lb 3.2 oz (65.409 kg)  BMI 26.37 kg/m2 Gen: NAD, pleasant, cooperative HEENT: normocephalic, atraumatic  Heart: regular rate and rhythm no murmur Lungs: clear to auscultation bilaterally, normal work of breathing  Neuro: grossly nonfocal, speech normal Ext: DP pulses normal bilaterally. Large superficial ulcer on plantar aspect of left great toe. No purulence. Some granulation tissue surrounds wound, as does thickened calloused skin. Nontender to palpation. Diminished sensation with monofilament testing bilaterally.       ASSESSMENT/PLAN:  DM (diabetes mellitus), type 2, uncontrolled ([place]) Very poorly controlled with A1c of 13.7. The greatest barrier to control here is that patient has not followed up regularly to allow for adequate adjustment of her insulin. I have even offered in the past to follow up with her by phone to adjust dosing, but she does not call with sugars. Pilar Plate discussion  with patient today that she is now experiencing a potentially severe complication of her uncontrolled diabetes and that if we do not aggressively treat this wound and control her diabetes she is at risk of losing her foot. Unfortunately I don't think she is cognitively capable of auto-adjusting her lantus based on fasting sugars. - given appointment for wound care center - increase lantus to 15 units daily - follow up with me in 2 weeks - note we attempted to get labwork today but unable to draw blood - will try again when she follows up in 2 weeks  Diabetic ulcer of toe ([place]) This wound puts her at risk of losing her toe/foot No surrounding cellulitis today but she likely has poor wound healing from diabetes and chronic prednisone use. Will benefit from wound specialist consultation. - We were able to get her an appointment at the wound care center (first available appointment in early May) - She will complete her course of ciprofloxacin as prescribed by urgent care. - adjusting insulin as above to achieve better cbg control - extensively discussed return precautions that should prompt her to go to ED, including worsening of the ulcer, streaking up her foot, etc. - follow up with me in 2 weeks.    FOLLOW UP: Follow up in 2 weeks for above issues Referral to wound care center.  [place]. [name], [place]

## 2015-12-11 NOTE — Patient Instructions (Addendum)
Increase your lantus to 15 units daily Follow up with me in 2 weeks Checking bloodwork today  Bring all your medicine to your next appointment with me  Getting you set up with a wound care specialist.  If you have fever, streaking of redness up your foot, anything else wrong with your foot go to ER  Be well, Dr. [name]

## 2015-12-12 DIAGNOSIS — L97509 Non-pressure chronic ulcer of other part of unspecified foot with unspecified severity: Secondary | ICD-10-CM

## 2015-12-12 DIAGNOSIS — E11621 Type 2 diabetes mellitus with foot ulcer: Secondary | ICD-10-CM

## 2015-12-12 HISTORY — DX: Non-pressure chronic ulcer of other part of unspecified foot with unspecified severity: L97.509

## 2015-12-12 HISTORY — DX: Type 2 diabetes mellitus with foot ulcer: E11.621

## 2015-12-12 NOTE — Assessment & Plan Note (Signed)
This wound puts her at risk of losing her toe/foot No surrounding cellulitis today but she likely has poor wound healing from diabetes and chronic prednisone use. Will benefit from wound specialist consultation. - We were able to get her an appointment at the wound care center (first available appointment in early May) - She will complete her course of ciprofloxacin as prescribed by urgent care. - adjusting insulin as above to achieve better cbg control - extensively discussed return precautions that should prompt her to go to ED, including worsening of the ulcer, streaking up her foot, etc. - follow up with me in 2 weeks.

## 2015-12-12 NOTE — Assessment & Plan Note (Addendum)
Very poorly controlled with A1c of 13.7. The greatest barrier to control here is that patient has not followed up regularly to allow for adequate adjustment of her insulin. I have even offered in the past to follow up with her by phone to adjust dosing, but she does not call with sugars. P[place] Plate discussion with patient today that she is now experiencing a potentially severe complication of her uncontrolled diabetes and that if we do not aggressively treat this wound and control her diabetes she is at risk of losing her foot. Unfortunately I don't think she is cognitively capable of auto-adjusting her lantus based on fasting sugars. - given appointment for wound care center - increase lantus to 15 units daily - follow up with me in 2 weeks - note we attempted to get labwork today but unable to draw blood - will try again when she follows up in 2 weeks

## 2015-12-20 ENCOUNTER — Other Ambulatory Visit: Payer: Self-pay | Admitting: Surgery

## 2015-12-20 ENCOUNTER — Ambulatory Visit (HOSPITAL_COMMUNITY)
Admission: RE | Admit: 2015-12-20 | Discharge: 2015-12-20 | Disposition: A | Discharge status: Home / Self Care | Payer: Medicare Other | Source: Ambulatory Visit | Attending: Surgery | Admitting: Surgery

## 2015-12-20 ENCOUNTER — Encounter (HOSPITAL_BASED_OUTPATIENT_CLINIC_OR_DEPARTMENT_OTHER): Payer: Medicare Other | Attending: Surgery

## 2015-12-20 DIAGNOSIS — E11621 Type 2 diabetes mellitus with foot ulcer: Secondary | ICD-10-CM | POA: Diagnosis not present

## 2015-12-20 DIAGNOSIS — Z6826 Body mass index (BMI) 26.0-26.9, adult: Secondary | ICD-10-CM | POA: Diagnosis not present

## 2015-12-20 DIAGNOSIS — E1140 Type 2 diabetes mellitus with diabetic neuropathy, unspecified: Secondary | ICD-10-CM | POA: Diagnosis not present

## 2015-12-20 DIAGNOSIS — E1165 Type 2 diabetes mellitus with hyperglycemia: Secondary | ICD-10-CM | POA: Insufficient documentation

## 2015-12-20 DIAGNOSIS — L97529 Non-pressure chronic ulcer of other part of left foot with unspecified severity: Secondary | ICD-10-CM | POA: Diagnosis not present

## 2015-12-20 DIAGNOSIS — M869 Osteomyelitis, unspecified: Secondary | ICD-10-CM

## 2015-12-20 DIAGNOSIS — M7989 Other specified soft tissue disorders: Secondary | ICD-10-CM | POA: Diagnosis not present

## 2015-12-20 DIAGNOSIS — I10 Essential (primary) hypertension: Secondary | ICD-10-CM | POA: Insufficient documentation

## 2015-12-20 DIAGNOSIS — Z79899 Other long term (current) drug therapy: Secondary | ICD-10-CM | POA: Insufficient documentation

## 2015-12-20 DIAGNOSIS — M009 Pyogenic arthritis, unspecified: Secondary | ICD-10-CM | POA: Diagnosis not present

## 2015-12-20 DIAGNOSIS — M069 Rheumatoid arthritis, unspecified: Secondary | ICD-10-CM | POA: Diagnosis not present

## 2015-12-20 DIAGNOSIS — Z794 Long term (current) use of insulin: Secondary | ICD-10-CM | POA: Diagnosis not present

## 2015-12-20 DIAGNOSIS — E669 Obesity, unspecified: Secondary | ICD-10-CM | POA: Diagnosis not present

## 2015-12-20 DIAGNOSIS — E11319 Type 2 diabetes mellitus with unspecified diabetic retinopathy without macular edema: Secondary | ICD-10-CM | POA: Insufficient documentation

## 2015-12-20 DIAGNOSIS — L97521 Non-pressure chronic ulcer of other part of left foot limited to breakdown of skin: Secondary | ICD-10-CM | POA: Insufficient documentation

## 2015-12-20 DIAGNOSIS — Z7982 Long term (current) use of aspirin: Secondary | ICD-10-CM | POA: Diagnosis not present

## 2015-12-20 DIAGNOSIS — I890 Lymphedema, not elsewhere classified: Secondary | ICD-10-CM | POA: Diagnosis not present

## 2015-12-20 DIAGNOSIS — M1990 Unspecified osteoarthritis, unspecified site: Secondary | ICD-10-CM | POA: Insufficient documentation

## 2015-12-26 ENCOUNTER — Encounter (HOSPITAL_BASED_OUTPATIENT_CLINIC_OR_DEPARTMENT_OTHER): Payer: Medicare Other

## 2015-12-27 ENCOUNTER — Ambulatory Visit: Payer: Medicare Other | Admitting: Family Medicine

## 2015-12-27 DIAGNOSIS — E11621 Type 2 diabetes mellitus with foot ulcer: Secondary | ICD-10-CM | POA: Diagnosis not present

## 2015-12-29 ENCOUNTER — Encounter: Payer: Self-pay | Admitting: Family Medicine

## 2015-12-29 ENCOUNTER — Ambulatory Visit (INDEPENDENT_AMBULATORY_CARE_PROVIDER_SITE_OTHER): Payer: Medicare Other | Admitting: Family Medicine

## 2015-12-29 VITALS — BP 146/69 | HR 91 | Temp 97.6°F | Ht 62.0 in | Wt 150.0 lb

## 2015-12-29 DIAGNOSIS — E11621 Type 2 diabetes mellitus with foot ulcer: Secondary | ICD-10-CM

## 2015-12-29 DIAGNOSIS — E118 Type 2 diabetes mellitus with unspecified complications: Secondary | ICD-10-CM

## 2015-12-29 DIAGNOSIS — L97509 Non-pressure chronic ulcer of other part of unspecified foot with unspecified severity: Secondary | ICD-10-CM

## 2015-12-29 DIAGNOSIS — IMO0002 Reserved for concepts with insufficient information to code with codable children: Secondary | ICD-10-CM

## 2015-12-29 DIAGNOSIS — I10 Essential (primary) hypertension: Secondary | ICD-10-CM

## 2015-12-29 DIAGNOSIS — E1165 Type 2 diabetes mellitus with hyperglycemia: Secondary | ICD-10-CM | POA: Diagnosis not present

## 2015-12-29 DIAGNOSIS — Z794 Long term (current) use of insulin: Secondary | ICD-10-CM

## 2015-12-29 MED ORDER — METFORMIN HCL 1000 MG PO TABS: 1000.0000 mg | ORAL_TABLET | Freq: Two times a day (BID) | ORAL | Status: DC

## 2015-12-29 MED ORDER — HYDROCHLOROTHIAZIDE 25 MG PO TABS: 25.0000 mg | ORAL_TABLET | Freq: Every day | ORAL | Status: DC

## 2015-12-29 MED ORDER — QUINAPRIL HCL 5 MG PO TABS: ORAL_TABLET | ORAL | Status: DC

## 2015-12-29 MED ORDER — INSULIN GLARGINE 100 UNIT/ML SOLOSTAR PEN: 18.0000 [IU] | PEN_INJECTOR | Freq: Every day | SUBCUTANEOUS | Status: DC

## 2015-12-29 MED ORDER — ROSUVASTATIN CALCIUM 20 MG PO TABS: 20.0000 mg | ORAL_TABLET | Freq: Every day | ORAL | Status: DC

## 2015-12-29 NOTE — Patient Instructions (Signed)
Keep seeing wound care doctor  Increase lantus to 18 units daily Call me in 1 week with what your sugars are doing so we can increase it further  Please bring all your medications to your next visit. I need to know if you're taking:  amlodipine aspirin furosemide hydrochlorothiazide metformin albuterol quinapril Rosuvastatin  See me in 3 weeks  Be well, Dr. [name]

## 2016-01-02 ENCOUNTER — Other Ambulatory Visit: Payer: Self-pay | Admitting: Surgery

## 2016-01-02 ENCOUNTER — Ambulatory Visit (INDEPENDENT_AMBULATORY_CARE_PROVIDER_SITE_OTHER)
Admission: RE | Admit: 2016-01-02 | Discharge: 2016-01-02 | Disposition: A | Discharge status: Home / Self Care | Payer: Medicare Other | Source: Ambulatory Visit | Attending: Vascular Surgery | Admitting: Vascular Surgery

## 2016-01-02 ENCOUNTER — Ambulatory Visit (HOSPITAL_COMMUNITY)
Admission: RE | Admit: 2016-01-02 | Discharge: 2016-01-02 | Disposition: A | Discharge status: Home / Self Care | Payer: Medicare Other | Source: Ambulatory Visit | Attending: Vascular Surgery | Admitting: Vascular Surgery

## 2016-01-02 DIAGNOSIS — E11621 Type 2 diabetes mellitus with foot ulcer: Secondary | ICD-10-CM | POA: Diagnosis not present

## 2016-01-02 DIAGNOSIS — R938 Abnormal findings on diagnostic imaging of other specified body structures: Secondary | ICD-10-CM | POA: Insufficient documentation

## 2016-01-02 DIAGNOSIS — R609 Edema, unspecified: Secondary | ICD-10-CM

## 2016-01-02 DIAGNOSIS — E11319 Type 2 diabetes mellitus with unspecified diabetic retinopathy without macular edema: Secondary | ICD-10-CM | POA: Insufficient documentation

## 2016-01-02 DIAGNOSIS — I10 Essential (primary) hypertension: Secondary | ICD-10-CM | POA: Diagnosis not present

## 2016-01-02 DIAGNOSIS — L97529 Non-pressure chronic ulcer of other part of left foot with unspecified severity: Principal | ICD-10-CM

## 2016-01-02 NOTE — Assessment & Plan Note (Signed)
Does not appear to be taking her blood pressure medications. Sent in HCTZ and quinapril for patient today. Hold on refilling amlodipine and furosemide so as to not restart all at once. Follow up for fasting labs in the next week.

## 2016-01-02 NOTE — Progress Notes (Signed)
Date of Visit: 12/29/2015   HPI:  Patient presents for follow up.  Currently taking lantus 15 units daily. Fasting sugars are ranging from 170-200. Patient brought medication list with her and sugar log today.  Unclear  If she's taking amlodipine, aspirin, lasix, HCTZ, metformin, albuterol, quinapril, lipitor.  She did eat breakfast today already.  Has been following regularly with would care center each Wednesday for the  Lesion on her toe. Overall that's doing well. No fevers.   ROS: See HPI.  [place]: history of hypothyroidism, diabetes, hypertension, rheumatoid arthritis  PHYSICAL EXAM: BP 146/69 mmHg  Pulse 91  Temp(Src) 97.6 F (36.4 C) (Oral)  Ht 5\' 2"  (1.575 m)  Wt 150 lb (68.04 kg)  BMI 27.43 kg/m2 Gen: NAD, pleasant, cooperative HEENT: ncat Heart: regular rate and rhythm, no murmur Lungs: clear to auscultation bilaterally, normal work of breathing  Neuro: alert, grossly nonfocal, speech normal Ext: ulceration to left first toe improved from prior, with good granulation tissue, no bleeding or purulence      ASSESSMENT/PLAN:  DM (diabetes mellitus), type 2, uncontrolled ([facility]) Remains poorly controlled Increase lantus to 18 units Follow up by phone in 1 week for further titration See me in 2-3 weeks Unfortunately faster uptitration is not possible as I don't trust patient's ability to safely follow self-titration instructions  Diabetic ulcer of toe ([place]) Improving with wound care center management. Continue to follow up there.   HYPERTENSION, BENIGN SYSTEMIC Does not appear to be taking her blood pressure medications. Sent in HCTZ and quinapril for patient today. Hold on refilling amlodipine and furosemide so as to not restart all at once. Follow up for fasting labs in the next week.   FOLLOW UP: Follow up in 1 week for lab visit See me in 2-3 weeks   [place] J. [name], [place]

## 2016-01-02 NOTE — Assessment & Plan Note (Signed)
Remains poorly controlled Increase lantus to 18 units Follow up by phone in 1 week for further titration See me in 2-3 weeks Unfortunately faster uptitration is not possible as I don't trust patient's ability to safely follow self-titration instructions

## 2016-01-02 NOTE — Assessment & Plan Note (Signed)
Improving with wound care center management. Continue to follow up there.

## 2016-01-03 DIAGNOSIS — E11621 Type 2 diabetes mellitus with foot ulcer: Secondary | ICD-10-CM | POA: Diagnosis not present

## 2016-01-16 DIAGNOSIS — E11621 Type 2 diabetes mellitus with foot ulcer: Secondary | ICD-10-CM | POA: Diagnosis not present

## 2016-01-19 ENCOUNTER — Ambulatory Visit: Payer: Medicare Other | Admitting: Family Medicine

## 2016-01-25 ENCOUNTER — Encounter: Payer: Self-pay | Admitting: Vascular Surgery

## 2016-01-31 ENCOUNTER — Encounter: Payer: Self-pay | Admitting: Vascular Surgery

## 2016-01-31 ENCOUNTER — Ambulatory Visit (INDEPENDENT_AMBULATORY_CARE_PROVIDER_SITE_OTHER): Payer: Medicare Other | Admitting: Vascular Surgery

## 2016-01-31 VITALS — BP 118/71 | HR 79 | Temp 97.1°F | Resp 18 | Ht 63.5 in | Wt 142.7 lb

## 2016-01-31 DIAGNOSIS — I739 Peripheral vascular disease, unspecified: Secondary | ICD-10-CM | POA: Diagnosis not present

## 2016-02-04 NOTE — Progress Notes (Signed)
Referring Physician: Dr Con Memos Patient name: [name] MRN: [id number] DOB: 26-Dec-1945 Sex: female  REASON FOR CONSULT: Left toe wound  HPI: [name] is a 70 y.o. female,  referred by Dr. Con Memos for evaluation of arterial and venous disease with recent non-healing wound. The patient states she does not really know how the wound first started but it took about a month to heal. She was seen at the wound care center but says she has now been discharged. She denies any claudication symptoms. She denies rest pain. Other medical problems include diabetes, schizotypal personality disorder, hypertension and thyroid disease all of which are stable.  Past Medical History  Diagnosis Date  . Diabetes mellitus age 45  . Retinopathy due to secondary diabetes mellitus ([place])     L>R, laser 3/07  . Retinal detachment, old, partial     left  . Schizotypal personality disorder   . Hypertension   . Thyroid disease    Past Surgical History  Procedure Laterality Date  . Thyroid radiation ablation      for Graves Disease  . Transthoracic echocardiogram       EF55-65%, nml - 12/14/2004  . Breast excisional biopsy    . Rotator cuff repair  1990's    left  . Breast surgery    . Cataract extraction      left     Family History  Problem Relation Age of Onset  . Heart disease Mother   . Hypertension Mother   . Lymphoma Sister   . Breast cancer Other   . Colon cancer Brother   . Colon cancer Maternal Grandmother     SOCIAL HISTORY: Social History   Social History  . Marital Status: Widowed    Spouse Name: N/A  . Number of Children: 3  . Years of Education: 12   Occupational History  . Retired- [employer] History Main Topics  . Smoking status: Never Smoker   . Smokeless tobacco: Never Used  . Alcohol Use: No  . Drug Use: No  . Sexual Activity: Not Currently   Other Topics Concern  . Not on file   Social History Narrative   3 daughters.  Takes care of 8  grandchildren at home.  Lots of stress.  no tobacco, no etoh.  Father is Cristy Hilts- deceased 11-11-2003. Sister - Casimer Leek.         Health Care POA:    Emergency Contact: daughter, Janett Billow (c) [phone]   End of Life Plan:    Who lives with you: self   Any pets: none   Diet: Pt has a varied diet of protein starch and vegetables. Pt reports eating a lot of potatoes and starches.  Does not follow diabetic diet.    Exercise: Pt does exercises 2x a week at [employer].    Seatbelts: Pt reports wearing seatbelt when in vehicles.    Sun Exposure/Protection: Pt reports not using sun protection.   Hobbies:  Attends Estate manager/land agent through [employer] Monday through Friday 9-5pm.  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          Allergies  Allergen Reactions  . Rosiglitazone Maleate Other (See Comments)    REACTION: Difficulty walking, Fatigue, shortness of breath    Current Outpatient Prescriptions  Medication Sig Dispense Refill  . amLODipine (NORVASC) 10 MG tablet TAKE 1 TABLET BY MOUTH EVERY DAY 90 tablet 0  . aspirin EC 325 MG tablet  TAKE 1 TABLET BY MOUTH EVERY DAY 30 tablet 5  . cetirizine (ZYRTEC) 10 MG tablet Take 1 tablet (10 mg total) by mouth daily. 30 tablet 11  . fluticasone (FLONASE) 50 MCG/ACT nasal spray PLACE 1 SPRAY INTO BOTH NOSTRILS DAILY. 16 g 2  . folic acid (FOLVITE) 1 MG tablet     . furosemide (LASIX) 20 MG tablet Take 1 tablet (20 mg total) by mouth daily. For 2 weeks 15 tablet 0  . hydrochlorothiazide (HYDRODIURIL) 25 MG tablet Take 1 tablet (25 mg total) by mouth daily. 90 tablet 1  . Insulin Glargine (LANTUS SOLOSTAR) 100 UNIT/ML Solostar Pen Inject 18 Units into the skin daily.    Marland Kitchen leflunomide (ARAVA) 10 MG tablet     . levothyroxine (SYNTHROID, LEVOTHROID) 50 MCG tablet TAKE 1 TABLET BY MOUTH EVERY DAY 30 tablet 0  . metFORMIN (GLUCOPHAGE) 1000 MG tablet Take 1 tablet (1,000 mg total) by mouth 2 (two) times daily with a meal. 180 tablet 1  . methotrexate  (RHEUMATREX) 2.5 MG tablet 5 tablets by mouth every Monday and Tuesday 40 tablet 1  . Multiple Vitamin (MULTIVITAMINS PO) 1 tablet by mouth daily     . polyethylene glycol powder (GLYCOLAX/MIRALAX) powder Take 17 g by mouth daily as needed (constipation). 255 g 1  . predniSONE (DELTASONE) 10 MG tablet Take 10 mg by mouth daily.    Marland Kitchen PROAIR HFA 108 (90 BASE) MCG/ACT inhaler INHALE 2 PUFFS INTO THE LUNGS EVERY 6 HOURS AS NEEDED FOR WHEEZING OR SHORTNESS OF BREATH 8 g 0  . quinapril (ACCUPRIL) 5 MG tablet TAKE 1 TABLET BY MOUTH EVERY DAY 90 tablet 0  . rosuvastatin (CRESTOR) 20 MG tablet Take 1 tablet (20 mg total) by mouth daily. 90 tablet 1   No current facility-administered medications for this visit.    ROS:   General:  No weight loss, Fever, chills  HEENT: No recent headaches, no nasal bleeding, no visual changes, no sore throat  Neurologic: No dizziness, blackouts, seizures. No recent symptoms of stroke or mini- stroke. No recent episodes of slurred speech, or temporary blindness.  Cardiac: No recent episodes of chest pain/pressure, no shortness of breath at rest.  + shortness of breath with exertion.  Denies history of atrial fibrillation or irregular heartbeat  Vascular: No history of rest pain in feet.  No history of claudication.  No history of non-healing ulcer, No history of DVT   Pulmonary: No home oxygen, no productive cough, no hemoptysis,  No asthma or wheezing  Musculoskeletal:  [ ]  Arthritis, [ ]  Low back pain,  [ ]  Joint pain  Hematologic:No history of hypercoagulable state.  No history of easy bleeding.  No history of anemia  Gastrointestinal: No hematochezia or melena,  No gastroesophageal reflux, no trouble swallowing  Urinary: [ ]  chronic Kidney disease, [ ]  on HD - [ ]  MWF or [ ]  TTHS, [ ]  Burning with urination, [ ]  Frequent urination, [ ]  Difficulty urinating;   Skin: No rashes  Psychological: No history of anxiety,  No history of depression   Physical  Examination  Filed Vitals:   01/31/16 1217  BP: 118/71  Pulse: 79  Temp: 97.1 F (36.2 C)  TempSrc: Oral  Resp: 18  Height: 5' 3.5" (1.613 m)  Weight: 142 lb 11.2 oz (64.728 kg)  SpO2: 100%    Body mass index is 24.88 kg/(m^2).  General:  Alert and oriented, no acute distress HEENT: Normal Neck: No bruit or JVD Pulmonary: Clear  to auscultation bilaterally Cardiac: Regular Rate and Rhythm without murmur Abdomen: Soft, non-tender, non-distended, no mass, no scars Skin: No rash Extremity Pulses:  2+ radial, brachial, femoral, absent popliteal and dorsalis pedis, posterior tibial pulses bilaterally Musculoskeletal: No deformity or edema  Neurologic: Upper and lower extremity motor 5/5 and symmetric  DATA:  Patient had a venous duplex exam 01/02/2016 showed no evidence of reflux. She also had a noninvasive arterial exam which showed calcified vessels bilaterally but biphasic to triphasic waveforms and toe pressure of 101 on the left 102 on the right  ASSESSMENT:  Patient with nearly healed wound on her foot but definitely has evidence of peripheral arterial disease. She had deterioration of her wound today to consider arteriogram.   PLAN:  Patient will follow-up with [place] in 6 months time for repeat noninvasive arterial exam. She'll follow-up sooner if she has any worsening of the wound on her foot or a new wound appears. At that point we would consider an arteriogram.   Ruta Hinds, MD [facility] [place] Office: [phone] Pager: [phone]

## 2016-02-14 ENCOUNTER — Ambulatory Visit (INDEPENDENT_AMBULATORY_CARE_PROVIDER_SITE_OTHER): Payer: Medicare Other | Admitting: Podiatry

## 2016-02-14 ENCOUNTER — Encounter: Payer: Self-pay | Admitting: Podiatry

## 2016-02-14 DIAGNOSIS — B351 Tinea unguium: Secondary | ICD-10-CM | POA: Diagnosis not present

## 2016-02-14 DIAGNOSIS — M79676 Pain in unspecified toe(s): Secondary | ICD-10-CM | POA: Diagnosis not present

## 2016-02-14 DIAGNOSIS — L97521 Non-pressure chronic ulcer of other part of left foot limited to breakdown of skin: Secondary | ICD-10-CM | POA: Diagnosis not present

## 2016-02-14 DIAGNOSIS — M79609 Pain in unspecified limb: Principal | ICD-10-CM

## 2016-02-14 NOTE — Progress Notes (Signed)
Subjective:     Patient ID: [name], female   DOB: 16-Jul-1946, 70 y.o.   MRN: [id number]  HPI patient returns to the office for preventative foot care services. She states that her nails have become thick and long are painful walking and wearing her shoes. She also has been under treatment for an ulcer on the big toe left foot, which was feet treated by the wound Center at [facility] and healed in 3 weeks. She presents the office today for continued evaluation and treatment of her nails.  Patient is diabetic.   Review of Systems     Objective:   Physical Exam GENERAL APPEARANCE: Alert, conversant. Appropriately groomed. No acute distress.  VASCULAR: Pedal pulses are  palpable at  [facility] and PT bilateral.  Capillary refill time is immediate to all digits,  Normal temperature gradient.   NEUROLOGIC: sensation is diminished l to 5.07 monofilament at 5/5 sites bilateral.  Light touch is intact bilateral, Muscle strength normal.  MUSCULOSKELETAL: acceptable muscle strength, tone and stability bilateral.  Intrinsic muscluature intact bilateral.  Rectus appearance of foot and digits noted bilateral. Swelling B/L  DERMATOLOGIC: skin color, texture, and turgor are within normal limits.  No preulcerative lesions or ulcers  are seen, no interdigital maceration noted.  No open lesions present.. No drainage noted. NAILS  Thick disfigured discolored nails both feet.Patient has brown discolored areas on second and thoird toes left foot with distal ulcer tip fourth toe left foot.No redness or swelling or drainage noted.      Assessment:     Ulcer fourth toe left foot  Onychomycosis B/L     Plan:  Debridement of nails both feet.  Evaluated fourth toe ulcer and it is healing with no infection.  After talking to this patient she admits to picking off the scab with her fingers.  Neosporin/DSD applied.  Home soaks were recommended.  RTC for nails in 3 months.  To return if the ulcer worsens.  It  appears she injured all her toes left foot but she does not remember the incident.   Gardiner Barefoot DPM

## 2016-03-07 ENCOUNTER — Encounter: Payer: Self-pay | Admitting: Vascular Surgery

## 2016-03-13 ENCOUNTER — Ambulatory Visit (HOSPITAL_COMMUNITY)
Admission: RE | Admit: 2016-03-13 | Discharge: 2016-03-13 | Disposition: A | Discharge status: Home / Self Care | Payer: Medicare Other | Source: Ambulatory Visit | Attending: Vascular Surgery | Admitting: Vascular Surgery

## 2016-03-13 ENCOUNTER — Ambulatory Visit (INDEPENDENT_AMBULATORY_CARE_PROVIDER_SITE_OTHER)
Admission: RE | Admit: 2016-03-13 | Discharge: 2016-03-13 | Disposition: A | Discharge status: Home / Self Care | Payer: Medicare Other | Source: Ambulatory Visit | Attending: Vascular Surgery | Admitting: Vascular Surgery

## 2016-03-13 DIAGNOSIS — I739 Peripheral vascular disease, unspecified: Secondary | ICD-10-CM

## 2016-03-13 DIAGNOSIS — E11319 Type 2 diabetes mellitus with unspecified diabetic retinopathy without macular edema: Secondary | ICD-10-CM | POA: Diagnosis not present

## 2016-03-13 DIAGNOSIS — R0989 Other specified symptoms and signs involving the circulatory and respiratory systems: Secondary | ICD-10-CM | POA: Diagnosis present

## 2016-03-13 DIAGNOSIS — I10 Essential (primary) hypertension: Secondary | ICD-10-CM | POA: Diagnosis not present

## 2016-03-13 DIAGNOSIS — E079 Disorder of thyroid, unspecified: Secondary | ICD-10-CM | POA: Diagnosis not present

## 2016-03-13 LAB — VAS US LOWER EXTREMITY ARTERIAL DUPLEX
LATIBDISTSYS: -155 cm/s
LEFT PERO DIST SYS: -101 cm/s
LPOPPPSV: 152 cm/s
LSFDPSV: -191 cm/s
LSFPPSV: -137 cm/s
Left super femoral mid sys PSV: -142 cm/s
RATIBDISTSYS: 137 cm/s
RIGHT POST TIB DIST SYS: -122 cm/s
RPERPSV: 63 cm/s
RPOPPPSV: 113 cm/s
RSFDPSV: -119 cm/s
RSFMPSV: -152 cm/s
RSFPPSV: -110 cm/s
Right popliteal dist sys PSV: -113 cm/s
left post tibial dist sys: -107 cm/s

## 2016-03-14 ENCOUNTER — Encounter: Payer: Self-pay | Admitting: Vascular Surgery

## 2016-03-14 ENCOUNTER — Ambulatory Visit (HOSPITAL_COMMUNITY): Payer: Medicare Other

## 2016-03-14 ENCOUNTER — Ambulatory Visit (INDEPENDENT_AMBULATORY_CARE_PROVIDER_SITE_OTHER): Payer: Medicare Other | Admitting: Vascular Surgery

## 2016-03-14 VITALS — BP 147/86 | HR 86 | Temp 97.0°F | Resp 16 | Ht 63.5 in | Wt 139.0 lb

## 2016-03-14 DIAGNOSIS — I739 Peripheral vascular disease, unspecified: Secondary | ICD-10-CM | POA: Diagnosis not present

## 2016-03-14 NOTE — Progress Notes (Signed)
Referring Physician: Dr Con Memos Patient name: [name]            MRN: [id number]                  DOB: [birth year]                    Sex: female   REASON FOR CONSULT: Left toe wound   HPI: [name] is a 70 y.o. female,  referred by Dr. Con Memos for evaluation of arterial and venous disease with recent non-healing wound. The patient states she does not really know how the wound first started but it took about a month to heal. She was seen at the wound care center but says she has now been discharged. She denies any claudication symptoms. She denies rest pain. Other medical problems include diabetes, schizotypal personality disorder, hypertension and thyroid disease all of which are stable.        Past Medical History  Diagnosis Date  . Diabetes mellitus age 73  . Retinopathy due to secondary diabetes mellitus ([place])        L>R, laser 3/07  . Retinal detachment, old, partial        left  . Schizotypal personality disorder    . Hypertension    . Thyroid disease            Past Surgical History  Procedure Laterality Date  . Thyroid radiation ablation          for Graves Disease  . Transthoracic echocardiogram           EF55-65%, nml - 12/14/2004  . Breast excisional biopsy      . Rotator cuff repair   1990's      left  . Breast surgery      . Cataract extraction          left            Family History  Problem Relation Age of Onset  . Heart disease Mother    . Hypertension Mother    . Lymphoma Sister    . Breast cancer Other    . Colon cancer Brother    . Colon cancer Maternal Grandmother        SOCIAL HISTORY: Social History         Social History  . Marital Status: Widowed      Spouse Name: N/A  . Number of Children: 3  . Years of Education: 12        Occupational History  . Retired- [employer] History Main Topics  . Smoking status: Never Smoker   . Smokeless tobacco: Never Used  . Alcohol Use: No  . Drug Use: No  .  Sexual Activity: Not Currently        Other Topics Concern  . Not on file       Social History Narrative    3 daughters.  Takes care of 8 grandchildren at home.  Lots of stress.  no tobacco, no etoh.  Father is Cristy Hilts- deceased 12-03-03. Sister - Casimer Leek.            Health Care POA:     Emergency Contact: daughter, Janett Billow (c) [phone]    End of Life Plan:     Who lives with you: self    Any pets: none    Diet: Pt has  a varied diet of protein starch and vegetables. Pt reports eating a lot of potatoes and starches.  Does not follow diabetic diet.     Exercise: Pt does exercises 2x a week at [employer].     Seatbelts: Pt reports wearing seatbelt when in vehicles.     Sun Exposure/Protection: Pt reports not using sun protection.    Hobbies:  Attends Estate manager/land agent through [employer] Monday through Friday 9-5pm.  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                     Allergies  Allergen Reactions  . Rosiglitazone Maleate Other (See Comments)      REACTION: Difficulty walking, Fatigue, shortness of breath            Current Outpatient Prescriptions  Medication Sig Dispense Refill  . amLODipine (NORVASC) 10 MG tablet TAKE 1 TABLET BY MOUTH EVERY DAY 90 tablet 0  . aspirin EC 325 MG tablet TAKE 1 TABLET BY MOUTH EVERY DAY 30 tablet 5  . cetirizine (ZYRTEC) 10 MG tablet Take 1 tablet (10 mg total) by mouth daily. 30 tablet 11  . fluticasone (FLONASE) 50 MCG/ACT nasal spray PLACE 1 SPRAY INTO BOTH NOSTRILS DAILY. 16 g 2  . folic acid (FOLVITE) 1 MG tablet        . furosemide (LASIX) 20 MG tablet Take 1 tablet (20 mg total) by mouth daily. For 2 weeks 15 tablet 0  . hydrochlorothiazide (HYDRODIURIL) 25 MG tablet Take 1 tablet (25 mg total) by mouth daily. 90 tablet 1  . Insulin Glargine (LANTUS SOLOSTAR) 100 UNIT/ML Solostar Pen Inject 18 Units into the skin daily.      Marland Kitchen leflunomide (ARAVA) 10 MG tablet        . levothyroxine (SYNTHROID, LEVOTHROID) 50 MCG tablet TAKE 1  TABLET BY MOUTH EVERY DAY 30 tablet 0  . metFORMIN (GLUCOPHAGE) 1000 MG tablet Take 1 tablet (1,000 mg total) by mouth 2 (two) times daily with a meal. 180 tablet 1  . methotrexate (RHEUMATREX) 2.5 MG tablet 5 tablets by mouth every Monday and Tuesday 40 tablet 1  . Multiple Vitamin (MULTIVITAMINS PO) 1 tablet by mouth daily       . polyethylene glycol powder (GLYCOLAX/MIRALAX) powder Take 17 g by mouth daily as needed (constipation). 255 g 1  . predniSONE (DELTASONE) 10 MG tablet Take 10 mg by mouth daily.      Marland Kitchen PROAIR HFA 108 (90 BASE) MCG/ACT inhaler INHALE 2 PUFFS INTO THE LUNGS EVERY 6 HOURS AS NEEDED FOR WHEEZING OR SHORTNESS OF BREATH 8 g 0  . quinapril (ACCUPRIL) 5 MG tablet TAKE 1 TABLET BY MOUTH EVERY DAY 90 tablet 0  . rosuvastatin (CRESTOR) 20 MG tablet Take 1 tablet (20 mg total) by mouth daily. 90 tablet 1    No current facility-administered medications for this visit.      ROS:    General:  No weight loss, Fever, chills   HEENT: No recent headaches, no nasal bleeding, no visual changes, no sore throat   Neurologic: No dizziness, blackouts, seizures. No recent symptoms of stroke or mini- stroke. No recent episodes of slurred speech, or temporary blindness.   Cardiac: No recent episodes of chest pain/pressure, no shortness of breath at rest.  + shortness of breath with exertion.  Denies history of atrial fibrillation or irregular heartbeat   Vascular: No history of rest pain in feet.  No history of claudication.  No history of non-healing  ulcer, No history of DVT    Pulmonary: No home oxygen, no productive cough, no hemoptysis,  No asthma or wheezing   Musculoskeletal:  [ ]  Arthritis, [ ]  Low back pain,  [ ]  Joint pain   Hematologic:No history of hypercoagulable state.  No history of easy bleeding.  No history of anemia   Gastrointestinal: No hematochezia or melena,  No gastroesophageal reflux, no trouble swallowing   Urinary: [ ]  chronic Kidney disease, [ ]  on HD - [ ]   MWF or [ ]  TTHS, [ ]  Burning with urination, [ ]  Frequent urination, [ ]  Difficulty urinating;    Skin: No rashes   Psychological: No history of anxiety,  No history of depression     Physical Examination   Vitals:   03/14/16 1624 03/14/16 1625  BP: (!) 161/88 (!) 147/86  Pulse: 86   Resp: 16   Temp: 97 F (36.1 C)   TempSrc: Oral   SpO2: 93%   Weight: 139 lb (63 kg)   Height: 5' 3.5" (1.613 m)        Body mass index is 24.88 kg/(m^2).   General:  Alert and oriented, no acute distress HEENT: Normal Neck: No bruit or JVD Pulmonary: Clear to auscultation bilaterally Cardiac: Regular Rate and Rhythm without murmur Abdomen: Soft, non-tender, non-distended, no mass, no scars Skin: No rash, No open wounds Extremity Pulses:  2+ radial, brachial, femoral, absent popliteal and dorsalis pedis, posterior tibial pulses bilaterally Musculoskeletal: No deformity or edema                Neurologic: Upper and lower extremity motor 5/5 and symmetric   DATA:  Patient had a venous duplex exam 01/02/2016 showed no evidence of reflux. She also had a noninvasive arterial exam which showed calcified vessels bilaterally but biphasic to triphasic waveforms and toe pressure of 101 on the left 102 on the right  We repeated that arterial study today which showed similar findings      ASSESSMENT:  Patient with nearly healed wound on her foot but definitely has evidence of peripheral arterial disease.     PLAN:  Patient will follow-up with [place] in 6 months time for repeat noninvasive arterial exam. She'll follow-up sooner if she has any worsening of the wound on her foot or a new wound appears. At that point we would consider an arteriogram.     Ruta Hinds, MD [facility] Office: [phone] Pager: [phone]

## 2016-05-08 ENCOUNTER — Ambulatory Visit (INDEPENDENT_AMBULATORY_CARE_PROVIDER_SITE_OTHER): Payer: Medicare Other | Admitting: Podiatry

## 2016-05-08 ENCOUNTER — Encounter: Payer: Self-pay | Admitting: Podiatry

## 2016-05-08 VITALS — HR 66 | Resp 14 | Ht 63.0 in | Wt 139.0 lb

## 2016-05-08 DIAGNOSIS — B351 Tinea unguium: Secondary | ICD-10-CM

## 2016-05-08 DIAGNOSIS — M79676 Pain in unspecified toe(s): Secondary | ICD-10-CM | POA: Diagnosis not present

## 2016-05-08 DIAGNOSIS — M79609 Pain in unspecified limb: Principal | ICD-10-CM

## 2016-05-08 NOTE — Progress Notes (Signed)
Patient ID: [name], female   DOB: December 04, 1945, 70 y.o.   MRN: [id number] Complaint:  Visit Type: Patient presents  to my office for  preventative foot care services. Complaint: Patient states" my nails have grown long and thick and become painful to walk and wear shoes" Patient has been diagnosed with DM with no foot complications. The patient presents for preventative foot care services. No changes to ROS  Podiatric Exam: Vascular: dorsalis pedis and posterior tibial pulses are not  palpable bilateral due to swollen feet/legs. Capillary return is immediate. Temperature gradient is WNL.   Sensorium: Normal Semmes Weinstein monofilament test. Normal tactile sensation bilaterally. Nail Exam: Pt has thick disfigured discolored nails with subungual debris noted bilateral entire nail hallux through second toes both feet.; Ulcer Exam: There is no evidence of ulcer or pre-ulcerative changes or infection. Orthopedic Exam: Muscle tone and strength are WNL. No limitations in general ROM. No crepitus or effusions noted. Foot type and digits show no abnormalities. Bony prominences are unremarkable. Skin: No Porokeratosis. No infection or ulcers  Diagnosis:  Onychomycosis, , Pain in right toe, pain in left toes  Treatment & Plan Procedures and Treatment: Consent by patient was obtained for treatment procedures. The patient understood the discussion of treatment and procedures well. All questions were answered thoroughly reviewed. Debridement of mycotic and hypertrophic toenails, 1 through 5 bilateral and clearing of subungual debris. No ulceration, no infection noted.  Return Visit-Office Procedure: Patient instructed to return to the office for a follow up visit 3 months for continued evaluation and treatment.   Gardiner Barefoot DPM

## 2016-07-04 ENCOUNTER — Other Ambulatory Visit: Payer: Self-pay | Admitting: *Deleted

## 2016-07-04 ENCOUNTER — Other Ambulatory Visit: Payer: Self-pay | Admitting: Family Medicine

## 2016-07-04 DIAGNOSIS — I739 Peripheral vascular disease, unspecified: Secondary | ICD-10-CM

## 2016-07-08 ENCOUNTER — Telehealth: Payer: Self-pay | Admitting: Family Medicine

## 2016-07-08 ENCOUNTER — Other Ambulatory Visit: Payer: Self-pay | Admitting: *Deleted

## 2016-07-08 MED ORDER — ROSUVASTATIN CALCIUM 20 MG PO TABS: 20.0000 mg | ORAL_TABLET | Freq: Every day | ORAL | 0 refills | Status: DC

## 2016-07-08 MED ORDER — METFORMIN HCL 1000 MG PO TABS: 1000.0000 mg | ORAL_TABLET | Freq: Two times a day (BID) | ORAL | 0 refills | Status: DC

## 2016-07-08 MED ORDER — HYDROCHLOROTHIAZIDE 25 MG PO TABS: 25.0000 mg | ORAL_TABLET | Freq: Every day | ORAL | 0 refills | Status: DC

## 2016-07-08 NOTE — Telephone Encounter (Signed)
To [facility] red team - please call pt and let her know I have refilled her medications but she needs to schedule an appointment to follow up on her medical problems. I have not seen her since May and she was supposed to return for [facility] and another office visit. Thanks!  [name], MD

## 2016-07-09 NOTE — Telephone Encounter (Signed)
LMOVM for pt to call back and make an appt.Rayni Nemitz, CMA  

## 2016-08-07 ENCOUNTER — Ambulatory Visit: Payer: Medicare Other | Admitting: Podiatry

## 2016-08-15 ENCOUNTER — Ambulatory Visit (INDEPENDENT_AMBULATORY_CARE_PROVIDER_SITE_OTHER): Payer: Medicare Other | Admitting: Podiatry

## 2016-08-15 ENCOUNTER — Encounter: Payer: Self-pay | Admitting: Podiatry

## 2016-08-15 VITALS — Ht 63.0 in | Wt 139.0 lb

## 2016-08-15 DIAGNOSIS — L089 Local infection of the skin and subcutaneous tissue, unspecified: Secondary | ICD-10-CM

## 2016-08-15 DIAGNOSIS — B351 Tinea unguium: Secondary | ICD-10-CM | POA: Diagnosis not present

## 2016-08-15 DIAGNOSIS — M79609 Pain in unspecified limb: Secondary | ICD-10-CM | POA: Diagnosis not present

## 2016-08-15 DIAGNOSIS — S90821A Blister (nonthermal), right foot, initial encounter: Secondary | ICD-10-CM

## 2016-08-15 NOTE — Progress Notes (Addendum)
Patient ID: [name], female   DOB: [birth year], 70 y.o.   MRN: [id number] Complaint:  Visit Type: Patient presents  to my office for  preventative foot care services. Complaint: Patient states" my nails have grown long and thick and become painful to walk and wear shoes" Patient has been diagnosed with DM with no foot complications. The patient presents for preventative foot care services. No changes to ROS  Podiatric Exam: Vascular: dorsalis pedis and posterior tibial pulses are not  palpable bilateral due to swollen feet/legs. Capillary return is immediate. Temperature gradient is WNL.   Sensorium: Normal Semmes Weinstein monofilament test. Normal tactile sensation bilaterally. Nail Exam: Pt has thick disfigured discolored nails with subungual debris noted bilateral entire nail hallux through second toes both feet.; Ulcer Exam: There is no evidence of ulcer or pre-ulcerative changes or infection. Orthopedic Exam: Muscle tone and strength are WNL. No limitations in general ROM. No crepitus or effusions noted. Foot type and digits show no abnormalities. Bony prominences are unremarkable. Skin: No Porokeratosis. No infection or ulcers,  Blister second toe right foot at the level DIPJ.    Diagnosis:  Onychomycosis, , Pain in right toe, pain in left toes  Treatment & Plan Procedures and Treatment: Consent by patient was obtained for treatment procedures. The patient understood the discussion of treatment and procedures well. All questions were answered thoroughly reviewed. Debridement of mycotic and hypertrophic toenails, 1 through 5 bilateral and clearing of subungual debris. No ulceration, no infection noted. Debridement of blister reveals skin ulceration with no infection.  Neosporin/DSD. Told patient she needs to have her ciirculation checked.  Watch second toe blister.  RTC prn Return Visit-Office Procedure: Patient instructed to return to the office for a follow up visit 3 months for continued  evaluation and treatment.   Gardiner Barefoot DPM

## 2016-09-20 ENCOUNTER — Encounter: Payer: Self-pay | Admitting: Family

## 2016-09-26 ENCOUNTER — Ambulatory Visit: Payer: Medicare Other | Admitting: Family

## 2016-09-26 ENCOUNTER — Ambulatory Visit (HOSPITAL_COMMUNITY): Payer: Medicare Other

## 2016-10-06 ENCOUNTER — Ambulatory Visit (HOSPITAL_COMMUNITY)
Admission: EM | Admit: 2016-10-06 | Discharge: 2016-10-06 | Disposition: A | Discharge status: Home / Self Care | Payer: Medicare Other | Attending: Family Medicine | Admitting: Family Medicine

## 2016-10-06 ENCOUNTER — Encounter (HOSPITAL_COMMUNITY): Payer: Self-pay | Admitting: Emergency Medicine

## 2016-10-06 DIAGNOSIS — R600 Localized edema: Secondary | ICD-10-CM

## 2016-10-06 DIAGNOSIS — E11621 Type 2 diabetes mellitus with foot ulcer: Secondary | ICD-10-CM

## 2016-10-06 DIAGNOSIS — L97521 Non-pressure chronic ulcer of other part of left foot limited to breakdown of skin: Secondary | ICD-10-CM | POA: Diagnosis not present

## 2016-10-06 MED ORDER — CLINDAMYCIN HCL 300 MG PO CAPS: 300.0000 mg | ORAL_CAPSULE | Freq: Three times a day (TID) | ORAL | 0 refills | Status: DC

## 2016-10-06 NOTE — ED Triage Notes (Signed)
The patient presented to the [facility] with a complaint of a wound to her left great toe x 2 weeks.

## 2016-10-06 NOTE — ED Provider Notes (Signed)
CSN: [id number]     Arrival date & time 10/06/16  1626 History   First MD Initiated Contact with Patient 10/06/16 1814     Chief Complaint  Patient presents with  . Wound Check   (Consider location/radiation/quality/duration/timing/severity/associated sxs/prior Treatment) HPI  [name] is a 71 y.o. female presenting to UC with c/o gradually worsening wound to her Left great toe that initially started several weeks ago.  She does have a podiatrist but has not seen him since the end of December.  She has also not f/u with her PCP in several weeks.  Per medical records, she was being treated for a diabetic foot ulcer and onychomycosis.  Last night pt noticed some clear drainage from her foot but she was too tired to bandage the foot so she placed a plastic sandwich bag on it.  When she woke, the bag was full of clear fluid.  Mild burning and itching pain to her feet. Hx of PAD as well but she is not currently taking her lasix as she needs refills on her daily medications. Denies fever or chills.    Past Medical History:  Diagnosis Date  . Diabetes mellitus age 87  . Hypertension   . Retinal detachment, old, partial    left  . Retinopathy due to secondary diabetes mellitus ([place])    L>R, laser 3/07  . Schizotypal personality disorder   . Thyroid disease    Past Surgical History:  Procedure Laterality Date  . BREAST EXCISIONAL BIOPSY    . BREAST SURGERY    . CATARACT EXTRACTION     left   . ROTATOR CUFF REPAIR  1990's   left  . Thyroid radiation ablation     for Graves Disease  . TRANSTHORACIC ECHOCARDIOGRAM      EF55-65%, nml - 12/14/2004   Family History  Problem Relation Age of Onset  . Heart disease Mother   . Hypertension Mother   . Lymphoma Sister   . Breast cancer Other   . Colon cancer Brother   . Colon cancer Maternal Grandmother    Social History  Substance Use Topics  . Smoking status: Never Smoker  . Smokeless tobacco: Never Used  . Alcohol use No   OB  History    No data available     Review of Systems  Respiratory: Negative for chest tightness and shortness of breath.   Cardiovascular: Positive for leg swelling ( bilateral (chronic)). Negative for chest pain and palpitations.  Skin: Positive for color change and wound. Negative for rash.  Neurological: Negative for weakness and numbness.    Allergies  Rosiglitazone maleate  Home Medications   Prior to Admission medications   Medication Sig Start Date End Date Taking? Authorizing Provider  amLODipine (NORVASC) 10 MG tablet TAKE 1 TABLET BY MOUTH EVERY DAY 03/13/15   [name], MD  aspirin EC 325 MG tablet TAKE 1 TABLET BY MOUTH EVERY DAY 08/18/13   [name], MD  cetirizine (ZYRTEC) 10 MG tablet Take 1 tablet (10 mg total) by mouth daily. 06/27/15   [name], MD  clindamycin (CLEOCIN) 300 MG capsule Take 1 capsule (300 mg total) by mouth 3 (three) times daily. X 7 days 10/06/16   Noland Fordyce, PA-C  fluticasone (FLONASE) 50 MCG/ACT nasal spray PLACE 1 SPRAY INTO BOTH NOSTRILS DAILY. 06/27/15   [name], MD  folic acid (FOLVITE) 1 MG tablet     Valinda Party, MD  furosemide (LASIX) 20  MG tablet Take 1 tablet (20 mg total) by mouth daily. For 2 weeks 06/01/14   [name], MD  hydrochlorothiazide (HYDRODIURIL) 25 MG tablet Take 1 tablet (25 mg total) by mouth daily. 07/08/16   [name], MD  Insulin Glargine (LANTUS SOLOSTAR) 100 UNIT/ML Solostar Pen Inject 18 Units into the skin daily. 12/29/15   [name], MD  leflunomide (ARAVA) 10 MG tablet     Valinda Party, MD  levothyroxine (SYNTHROID, LEVOTHROID) 50 MCG tablet TAKE 1 TABLET BY MOUTH EVERY DAY 09/25/15   Alveda Reasons, MD  metFORMIN (GLUCOPHAGE) 1000 MG tablet Take 1 tablet (1,000 mg total) by mouth 2 (two) times daily with a meal. 07/08/16   [name], MD  methotrexate (RHEUMATREX) 2.5 MG tablet 5 tablets by mouth every Monday and Tuesday 02/05/11    Jacquelyn A McGill, MD  Multiple Vitamin (MULTIVITAMINS PO) 1 tablet by mouth daily     Historical Provider, MD  polyethylene glycol powder (GLYCOLAX/MIRALAX) powder Take 17 g by mouth daily as needed (constipation). 03/18/13   [name], MD  predniSONE (DELTASONE) 10 MG tablet Take 10 mg by mouth daily.    Historical Provider, MD  PROAIR HFA 108 (90 BASE) MCG/ACT inhaler INHALE 2 PUFFS INTO THE LUNGS EVERY 6 HOURS AS NEEDED FOR WHEEZING OR SHORTNESS OF BREATH 06/27/15   [name], MD  quinapril (ACCUPRIL) 5 MG tablet TAKE 1 TABLET BY MOUTH EVERY DAY 12/29/15   [name], MD  rosuvastatin (CRESTOR) 20 MG tablet Take 1 tablet (20 mg total) by mouth daily. 07/08/16   [name], MD   Meds Ordered and Administered this Visit  Medications - No data to display  BP 162/79 (BP Location: Right Arm)   Pulse 93   Temp 98.3 F (36.8 C) (Oral)   Resp 18   SpO2 100%  No data found.   Physical Exam  Constitutional: She is oriented to person, place, and time. She appears well-developed and well-nourished. No distress.  HENT:  Head: Normocephalic and atraumatic.  Eyes: EOM are normal.  Neck: Normal range of motion.  Cardiovascular: Normal rate.   Pulmonary/Chest: Effort normal. No respiratory distress.  Musculoskeletal: Normal range of motion. She exhibits edema.  2+ pedal edema bilaterally Full ROM Left ankle and toes. Tenderness to Left toe (see skin exam)  Neurological: She is alert and oriented to person, place, and time.  Skin: Skin is warm and dry. Capillary refill takes less than 2 seconds. She is not diaphoretic. There is erythema.  Left foot: skin is moist and malodorous.  Macerated appearing skin on Left great toe. Scant red blood and surrounding erythema.   Psychiatric: She has a normal mood and affect. Her behavior is normal.  Nursing note and vitals reviewed.   Urgent Care Course     Procedures (including critical care time)  Labs  Review Labs Reviewed - No data to display  Imaging Review No results found.    MDM   1. Pedal edema   2. Diabetic ulcer of toe of left foot associated with type 2 diabetes mellitus, limited to breakdown of skin ([place])    Concern for infected diabetic foot ulcer. Wound redressed with dry guaze.   Rx: clindamycin   Strongly encouraged f/u with PCP and Podiatrist in 2-3 days for recheck of symptoms and to ask about home health for help with bandage changes.    Noland Fordyce, PA-C 10/06/16 [phone]

## 2016-10-06 NOTE — Discharge Instructions (Signed)
°  It is very important to follow up with your podiatrist and/or your primary care provider this week for recheck of your symptoms and more bandage changes.  If you need help with wound care at home, please discuss the possibility of having a home health nurse come help with bandage changes.  You should ask your primary care provider.

## 2016-10-11 ENCOUNTER — Ambulatory Visit (INDEPENDENT_AMBULATORY_CARE_PROVIDER_SITE_OTHER): Payer: Medicare Other

## 2016-10-11 ENCOUNTER — Encounter: Payer: Self-pay | Admitting: Podiatry

## 2016-10-11 ENCOUNTER — Ambulatory Visit (INDEPENDENT_AMBULATORY_CARE_PROVIDER_SITE_OTHER): Payer: Medicare Other | Admitting: Podiatry

## 2016-10-11 DIAGNOSIS — S91302A Unspecified open wound, left foot, initial encounter: Secondary | ICD-10-CM

## 2016-10-12 NOTE — Progress Notes (Signed)
Subjective:     Patient ID: [name], female   DOB: Nov 10, 1945, 71 y.o.   MRN: [id number]  HPI patient was concerned because she had some crusted tissue on her left big toe and she was worried that it may be infection   Review of Systems     Objective:   Physical Exam Neurovascular status unchanged from previous visit with keratotic lesion on the left hallux medial side localized with no erythema edema or drainage associated with    Assessment:     Probable previous superficial blister that's healed well with crusted tissue but no indications of tissue exposure    Plan:     H&P diabetic education rendered and carefully debrided the tissue and explained if any drainage redness swelling were to occur to let us know immediately and if not there does not appear to be any issue with the condition she is experiencing

## 2016-10-29 ENCOUNTER — Encounter (HOSPITAL_COMMUNITY): Payer: Medicare Other

## 2016-10-29 ENCOUNTER — Encounter (HOSPITAL_BASED_OUTPATIENT_CLINIC_OR_DEPARTMENT_OTHER): Payer: Medicare Other | Attending: Surgery

## 2016-10-29 ENCOUNTER — Ambulatory Visit: Payer: Medicare Other | Admitting: Family

## 2016-10-29 DIAGNOSIS — Z7952 Long term (current) use of systemic steroids: Secondary | ICD-10-CM | POA: Diagnosis not present

## 2016-10-29 DIAGNOSIS — Z7982 Long term (current) use of aspirin: Secondary | ICD-10-CM | POA: Insufficient documentation

## 2016-10-29 DIAGNOSIS — Z7951 Long term (current) use of inhaled steroids: Secondary | ICD-10-CM | POA: Insufficient documentation

## 2016-10-29 DIAGNOSIS — M069 Rheumatoid arthritis, unspecified: Secondary | ICD-10-CM | POA: Diagnosis not present

## 2016-10-29 DIAGNOSIS — Z79899 Other long term (current) drug therapy: Secondary | ICD-10-CM | POA: Insufficient documentation

## 2016-10-29 DIAGNOSIS — Z794 Long term (current) use of insulin: Secondary | ICD-10-CM | POA: Insufficient documentation

## 2016-10-29 DIAGNOSIS — I10 Essential (primary) hypertension: Secondary | ICD-10-CM | POA: Diagnosis not present

## 2016-10-29 DIAGNOSIS — I890 Lymphedema, not elsewhere classified: Secondary | ICD-10-CM | POA: Diagnosis not present

## 2016-10-29 DIAGNOSIS — E11621 Type 2 diabetes mellitus with foot ulcer: Secondary | ICD-10-CM | POA: Diagnosis not present

## 2016-10-29 DIAGNOSIS — L97521 Non-pressure chronic ulcer of other part of left foot limited to breakdown of skin: Secondary | ICD-10-CM | POA: Insufficient documentation

## 2016-11-04 ENCOUNTER — Other Ambulatory Visit: Payer: Medicare Other

## 2016-11-04 DIAGNOSIS — E118 Type 2 diabetes mellitus with unspecified complications: Principal | ICD-10-CM

## 2016-11-04 DIAGNOSIS — Z794 Long term (current) use of insulin: Secondary | ICD-10-CM

## 2016-11-04 DIAGNOSIS — E1165 Type 2 diabetes mellitus with hyperglycemia: Secondary | ICD-10-CM

## 2016-11-04 DIAGNOSIS — IMO0002 Reserved for concepts with insufficient information to code with codable children: Secondary | ICD-10-CM

## 2016-11-05 DIAGNOSIS — E11621 Type 2 diabetes mellitus with foot ulcer: Secondary | ICD-10-CM | POA: Diagnosis not present

## 2016-11-05 LAB — LIPID PANEL
CHOL/HDL RATIO: 2.9 ratio (ref 0.0–4.4)
CHOLESTEROL TOTAL: 129 mg/dL (ref 100–199)
HDL: 44 mg/dL (ref >39)
LDL CALC: 68 mg/dL (ref 0–99)
Triglycerides: 87 mg/dL (ref 0–149)
VLDL Cholesterol Cal: 17 mg/dL (ref 5–40)

## 2016-11-05 LAB — COMPREHENSIVE METABOLIC PANEL
ALT: 8 IU/L (ref 0–32)
AST: 11 IU/L (ref 0–40)
Albumin/Globulin Ratio: 0.5 — ABNORMAL LOW (ref 1.2–2.2)
Albumin: 3 g/dL — ABNORMAL LOW (ref 3.5–4.8)
Alkaline Phosphatase: 67 IU/L (ref 39–117)
BUN / CREAT RATIO: 15 (ref 12–28)
BUN: 16 mg/dL (ref 8–27)
Bilirubin Total: 0.3 mg/dL (ref 0.0–1.2)
CO2: 22 mmol/L (ref 18–29)
CREATININE: 1.1 mg/dL — AB (ref 0.57–1.00)
Calcium: 8.8 mg/dL (ref 8.7–10.3)
Chloride: 99 mmol/L (ref 96–106)
GFR calc Af Amer: 59 mL/min/{1.73_m2} — ABNORMAL LOW (ref >59)
GFR calc non Af Amer: 51 mL/min/{1.73_m2} — ABNORMAL LOW (ref >59)
Globulin, Total: 5.7 g/dL — ABNORMAL HIGH (ref 1.5–4.5)
Glucose: 91 mg/dL (ref 65–99)
Potassium: 4.1 mmol/L (ref 3.5–5.2)
Sodium: 141 mmol/L (ref 134–144)
Total Protein: 8.7 g/dL — ABNORMAL HIGH (ref 6.0–8.5)

## 2016-11-06 LAB — HEPATITIS C ANTIBODY: Hep C Virus Ab: 0.4 s/co ratio (ref 0.0–0.9)

## 2016-11-07 ENCOUNTER — Encounter: Payer: Self-pay | Admitting: Podiatrist

## 2016-11-07 ENCOUNTER — Ambulatory Visit (INDEPENDENT_AMBULATORY_CARE_PROVIDER_SITE_OTHER): Payer: Medicare Other | Admitting: Podiatrist

## 2016-11-07 DIAGNOSIS — B351 Tinea unguium: Secondary | ICD-10-CM

## 2016-11-07 DIAGNOSIS — L97521 Non-pressure chronic ulcer of other part of left foot limited to breakdown of skin: Secondary | ICD-10-CM

## 2016-11-07 DIAGNOSIS — M79609 Pain in unspecified limb: Secondary | ICD-10-CM

## 2016-11-07 NOTE — Progress Notes (Signed)
Patient presents today for diabetic nail care- she has an ulcer on the medial side of the left hallux and is being seen at the wound care center. She has a home health nurse who changes her dressings.  She has also been wearing compression stockings and states it is helping with her swelling.  Relates no fever, chills or symptoms of infection  O:  dp pulses palpable at 1/4 bilateral, pt pulses unable to palpate due to edema.  2+ pitting edema present bilateral feet and lower legs.  All 10 toenails elongated and dystrophic.  Ulcer medial side of left hallux has an [place] of centralized healthy tissue.  Distal portion of the ulcer measures 3.5cm x 1.5cm and proximal portion measures 1.7cm x 1.5 cm.  Both areas are superficial and healing.  No pus or sign of infection present  A:  Diabetic female with dystrophy of nail Healing ulcer left hallux  P:  Debrided 10 toenails manually and mechanically without complication.  Re-dressed the wound with the silver dressing she has been using through the wound care center.  She will continue to follow up with wound care and home health regarding the ulcer.  She will return in 3 months for routine diabetic foot care.  Recommended continued wear of compression stockings.

## 2016-11-07 NOTE — Patient Instructions (Signed)
Edema Edema is when you have too much fluid in your body or under your skin. Edema may make your legs, feet, and ankles swell up. Swelling is also common in looser tissues, like around your eyes. This is a common condition. It gets more common as you get older. There are many possible causes of edema. Eating too much salt (sodium) and being on your feet or sitting for a long time can cause edema in your legs, feet, and ankles. Hot weather may make edema worse. Edema is usually painless. Your skin may look swollen or shiny. Follow these instructions at home:  Keep the swollen body part raised (elevated) above the level of your heart when you are sitting or lying down.  Do not sit still or stand for a long time.  Do not wear tight clothes. Do not wear garters on your upper legs.  Exercise your legs. This can help the swelling go down.  Wear elastic bandages or support stockings as told by your doctor.  Eat a low-salt (low-sodium) diet to reduce fluid as told by your doctor.  Depending on the cause of your swelling, you may need to limit how much fluid you drink (fluid restriction).  Take over-the-counter and prescription medicines only as told by your doctor. Contact a doctor if:  Treatment is not working.  You have heart, liver, or kidney disease and have symptoms of edema.  You have sudden and unexplained weight gain. Get help right away if:  You have shortness of breath or chest pain.  You cannot breathe when you lie down.  You have pain, redness, or warmth in the swollen areas.  You have heart, liver, or kidney disease and get edema all of a sudden.  You have a fever and your symptoms get worse all of a sudden. Summary  Edema is when you have too much fluid in your body or under your skin.  Edema may make your legs, feet, and ankles swell up. Swelling is also common in looser tissues, like around your eyes.  Raise (elevate) the swollen body part above the level of your  heart when you are sitting or lying down.  Follow your doctor's instructions about diet and how much fluid you can drink (fluid restriction). This information is not intended to replace advice given to you by your health care provider. Make sure you discuss any questions you have with your health care provider. Document Released: 01/22/2008 Document Revised: 08/23/2016 Document Reviewed: 08/23/2016 Elsevier Interactive Patient Education  2017 [employer]  

## 2016-11-11 ENCOUNTER — Other Ambulatory Visit: Payer: Self-pay | Admitting: Family Medicine

## 2016-11-11 NOTE — Telephone Encounter (Signed)
Pt calling to request refill of:  Name of Medication(s):  Glucose meter, test strips, and lancets (pt lost all of this) Last date of OV:   Pharmacy:  [facility] on [address]   Will route refill request to Clinic RN.  Discussed with patient policy to call pharmacy for future refills.  Also, discussed refills may take up to 48 hours to approve or deny.  Renella Cunas

## 2016-11-11 NOTE — Telephone Encounter (Signed)
2nd request. ep °

## 2016-11-12 DIAGNOSIS — E11621 Type 2 diabetes mellitus with foot ulcer: Secondary | ICD-10-CM | POA: Diagnosis not present

## 2016-11-13 ENCOUNTER — Telehealth: Payer: Self-pay | Admitting: Family Medicine

## 2016-11-13 DIAGNOSIS — E8809 Other disorders of plasma-protein metabolism, not elsewhere classified: Secondary | ICD-10-CM

## 2016-11-13 DIAGNOSIS — R779 Abnormality of plasma protein, unspecified: Secondary | ICD-10-CM

## 2016-11-13 NOTE — Telephone Encounter (Signed)
Called patient to discuss lab results. - hep C neg - renal function slightly decreased from prior. Will repeat in a few weeks - protein gap noted - elevated protein, low albumin. Needs SPEP.  Patient advised of results. Lab visit scheduled for 11/26/16. She has follow up visit scheduled with me on 12/03/16.  [name], MD

## 2016-11-14 MED ORDER — GLUCOSE BLOOD VI STRP: ORAL_STRIP | 12 refills | Status: DC

## 2016-11-14 MED ORDER — BAYER CONTOUR NEXT MONITOR W/DEVICE KIT: PACK | 0 refills | Status: DC

## 2016-11-14 MED ORDER — MICROLET LANCETS MISC: 12 refills | Status: DC

## 2016-11-14 MED ORDER — MICROLET NEXT LANCING DEVICE MISC: 1.0000 | Freq: Three times a day (TID) | 0 refills | Status: DC

## 2016-11-19 ENCOUNTER — Encounter (HOSPITAL_BASED_OUTPATIENT_CLINIC_OR_DEPARTMENT_OTHER): Payer: Medicare Other | Attending: Surgery

## 2016-11-19 DIAGNOSIS — I10 Essential (primary) hypertension: Secondary | ICD-10-CM | POA: Insufficient documentation

## 2016-11-19 DIAGNOSIS — L97522 Non-pressure chronic ulcer of other part of left foot with fat layer exposed: Secondary | ICD-10-CM | POA: Insufficient documentation

## 2016-11-19 DIAGNOSIS — E11621 Type 2 diabetes mellitus with foot ulcer: Secondary | ICD-10-CM | POA: Insufficient documentation

## 2016-11-20 ENCOUNTER — Encounter: Payer: Self-pay | Admitting: Family

## 2016-11-20 DIAGNOSIS — I10 Essential (primary) hypertension: Secondary | ICD-10-CM | POA: Diagnosis not present

## 2016-11-20 DIAGNOSIS — E11621 Type 2 diabetes mellitus with foot ulcer: Secondary | ICD-10-CM | POA: Diagnosis not present

## 2016-11-20 DIAGNOSIS — L97522 Non-pressure chronic ulcer of other part of left foot with fat layer exposed: Secondary | ICD-10-CM | POA: Diagnosis not present

## 2016-11-21 ENCOUNTER — Telehealth: Payer: Self-pay | Admitting: Family Medicine

## 2016-11-21 NOTE — Telephone Encounter (Signed)
I believe podiatry is following her for this issue - I have not seen patient since May 2017 Can you ask them to call podiatrists office for any orders they need?  Thanks [name], MD

## 2016-11-21 NOTE — Telephone Encounter (Signed)
[name] from [facility] called and is requesting that they do the patients wound care on Monday's and Friday's. The would like the patient to go to wound care on Wednesday. They also wanted the doctor to that they also do labs.Please call her with any questions. jw

## 2016-11-22 NOTE — Telephone Encounter (Signed)
Ms [name] from [facility] informed and given the number to dr Neomia Dear office. Deseree Kennon Holter, CMA

## 2016-11-25 ENCOUNTER — Other Ambulatory Visit: Payer: Medicare Other

## 2016-11-25 ENCOUNTER — Ambulatory Visit (INDEPENDENT_AMBULATORY_CARE_PROVIDER_SITE_OTHER): Payer: Medicare Other | Admitting: *Deleted

## 2016-11-25 VITALS — BP 176/80 | HR 84

## 2016-11-25 DIAGNOSIS — R779 Abnormality of plasma protein, unspecified: Secondary | ICD-10-CM

## 2016-11-25 DIAGNOSIS — I10 Essential (primary) hypertension: Secondary | ICD-10-CM

## 2016-11-25 DIAGNOSIS — Z0130 Encounter for examination of blood pressure without abnormal findings: Secondary | ICD-10-CM

## 2016-11-25 DIAGNOSIS — E8809 Other disorders of plasma-protein metabolism, not elsewhere classified: Secondary | ICD-10-CM

## 2016-11-25 NOTE — Progress Notes (Signed)
   Patient was in clinic for lab work.  Patient mentioned to phlebotomist that she was not feeling well and requested a blood pressure check.  Patient reported to nurse that she was rushing to catch the bus so she would not miss her appointment.  She felt SOB and felt like she had a heartbeat in her stomach.  Patient also stated when she went to to wound care center her blood pressures are always very high according to the wound care nurse.  Patient denies chest pain, SOB, dizziness, n/v, abdominal pain, visual changes.  Symptoms from earlier were not present with triage nurse.  Patient also mentioned that she could not get blood pressure medication refilled until she had her appointment with PCP, which is April 17 th, 2018; appt rescheduled for April 11th at 11 AM with Amin.  Advised patient if she symptoms return or she develop any of symptoms listed to call 911.  Patient stated understanding.  Precept with Dr. McDiarmid; agreed with plan.  Today's Vitals   11/25/16 1100 11/25/16 1108  BP: (!) 180/88 (!) 176/80  Pulse: 89 84  SpO2: 98% 98%  PainSc: 0-No pain   Derl Barrow, RN

## 2016-11-26 ENCOUNTER — Other Ambulatory Visit: Payer: Medicare Other

## 2016-11-26 DIAGNOSIS — E11621 Type 2 diabetes mellitus with foot ulcer: Secondary | ICD-10-CM | POA: Diagnosis not present

## 2016-11-27 ENCOUNTER — Ambulatory Visit (INDEPENDENT_AMBULATORY_CARE_PROVIDER_SITE_OTHER): Payer: Medicare Other | Admitting: Family Medicine

## 2016-11-27 ENCOUNTER — Encounter: Payer: Self-pay | Admitting: Family Medicine

## 2016-11-27 VITALS — BP 160/82 | HR 81 | Temp 97.6°F | Ht 63.0 in | Wt 150.0 lb

## 2016-11-27 DIAGNOSIS — I10 Essential (primary) hypertension: Secondary | ICD-10-CM

## 2016-11-27 DIAGNOSIS — IMO0002 Reserved for concepts with insufficient information to code with codable children: Secondary | ICD-10-CM

## 2016-11-27 DIAGNOSIS — E118 Type 2 diabetes mellitus with unspecified complications: Secondary | ICD-10-CM

## 2016-11-27 DIAGNOSIS — E1165 Type 2 diabetes mellitus with hyperglycemia: Secondary | ICD-10-CM

## 2016-11-27 DIAGNOSIS — Z794 Long term (current) use of insulin: Secondary | ICD-10-CM

## 2016-11-27 LAB — CMP14+EGFR
ALBUMIN: 2.9 g/dL — AB (ref 3.5–4.8)
ALK PHOS: 73 IU/L (ref 39–117)
ALT: 7 IU/L (ref 0–32)
AST: 12 IU/L (ref 0–40)
Albumin/Globulin Ratio: 0.5 — ABNORMAL LOW (ref 1.2–2.2)
BUN / CREAT RATIO: 13 (ref 12–28)
BUN: 13 mg/dL (ref 8–27)
Bilirubin Total: 0.4 mg/dL (ref 0.0–1.2)
CO2: 27 mmol/L (ref 18–29)
Calcium: 9.1 mg/dL (ref 8.7–10.3)
Chloride: 101 mmol/L (ref 96–106)
Creatinine, Ser: 0.99 mg/dL (ref 0.57–1.00)
GFR calc Af Amer: 66 mL/min/{1.73_m2} (ref >59)
GFR calc non Af Amer: 58 mL/min/{1.73_m2} — ABNORMAL LOW (ref >59)
GLUCOSE: 93 mg/dL (ref 65–99)
Globulin, Total: 5.6 g/dL — ABNORMAL HIGH (ref 1.5–4.5)
Potassium: 4.6 mmol/L (ref 3.5–5.2)
Sodium: 138 mmol/L (ref 134–144)
TOTAL PROTEIN: 8.5 g/dL (ref 6.0–8.5)

## 2016-11-27 LAB — PROTEIN ELECTROPHORESIS, SERUM
A/G Ratio: 0.4 — ABNORMAL LOW (ref 0.7–1.7)
ALBUMIN ELP: 2.6 g/dL — AB (ref 2.9–4.4)
ALPHA 2: 0.8 g/dL (ref 0.4–1.0)
Alpha 1: 0.3 g/dL (ref 0.0–0.4)
BETA: 1.2 g/dL (ref 0.7–1.3)
Gamma Globulin: 3.6 g/dL — ABNORMAL HIGH (ref 0.4–1.8)
Globulin, Total: 5.9 g/dL — ABNORMAL HIGH (ref 2.2–3.9)

## 2016-11-27 LAB — POCT GLYCOSYLATED HEMOGLOBIN (HGB A1C): Hemoglobin A1C: 7.7

## 2016-11-27 MED ORDER — QUINAPRIL HCL 5 MG PO TABS
ORAL_TABLET | ORAL | 0 refills | Status: DC
Start: 2016-11-27 — End: 2016-12-27

## 2016-11-27 NOTE — Patient Instructions (Addendum)
It was nice meeting you today! You were seen in clinic for elevated blood pressures to 180/88.  Today your blood pressure remained high at 160/80.  I have refilled your Quinapril 5 mg for 30 days. Please take this daily.  I would like for you to measure your blood pressures at home and write them down.   I saw that your appointment with Dr. [name] on the 17th was cancelled and have made one with another doctor instead. I strongly advise you to keep your appointment for follow up and we will add back your other blood pressure medications if appropriate at that time.  Please call clinic if you have any questions.    Be well,  Lovenia Kim, MD

## 2016-11-27 NOTE — Progress Notes (Signed)
   Subjective:   Patient ID: [name]    DOB: October 04, 1945, 71 y.o. female   MRN: [id number]  CC: BP follow up   HPI: [name] is a 71 y.o. female who presents to clinic today for high BP.  Was seen for lab visit on 11/26/2016 and did not feel well.  Vague history of feeling like she had some ringing in her ears . Noted to have BP of 188/80 with repeat BP 176/80 at lab visit.   Has been going to wound care center and they have been telling her that her blood pressures are too high.  Reports she has not taken her BP medications in over a month due to missing her appointment for BP follow up and did not get medication refills. States she normally takes Norvasc 10 mg, HCTZ 25 mg, and Quinapril 5 mg daily.  Was supposed to see Dr. [name] on 4/17 for follow up and refills.    Going to see vascular specialist tomorrow due to bad circulation.   She has been wearing compression stockings to help with swelling.    ROS: Denies fevers, chills, nausea, vomiting, diarrhea.  No shortness of breath, dizziness.    [name]: Pertinent past medical, surgical, family, and social history were reviewed and updated as appropriate. Smoking status reviewed.  Medications reviewed.  Objective:   BP (!) 160/82 (BP Location: Right Arm)   Pulse 81   Temp 97.6 F (36.4 C) (Oral)   Ht 5\' 3"  (1.6 m)   Wt 150 lb (68 kg)   SpO2 99%   BMI 26.57 kg/m  Vitals and nursing note reviewed.  Vitals:   11/27/16 1106 11/27/16 1210 11/27/16 1211  BP: (!) 142/70 (!) 150/80 (!) 160/82  Pulse: 81    Temp: 97.6 F (36.4 C)    TempSrc: Oral    SpO2: 99%    Weight: 150 lb (68 kg)    Height: 5\' 3"  (1.6 m)     General: 71 yo F, in NAD HEENT: NCAT, MMM Neck: supple CV: RRR no MRG  Lungs: CTA B/L  Abdomen: soft, NT, ND, +bs Skin: warm, dry Extremities: no edema or tenderness   Assessment & Plan:   HYPERTENSION, BENIGN SYSTEMIC BP checked today in both arms.  L arm with 150/80 and R arm 160/82.  Patient does not have  refills on medications and was supposed to follow up with PCP on 12/03/2016.   -Refilled Quinapril 5 mg today given her blood pressures are elevated today.  Strongly urged her to keep follow up appointment on 4/19 and will add back other BP meds at that time if appropriate  -Patient to monitor blood pressures at home    Orders Placed This Encounter  Procedures  . POCT glycosylated hemoglobin (Hb A1C)   Meds ordered this encounter  Medications  . quinapril (ACCUPRIL) 5 MG tablet    Sig: TAKE 1 TABLET BY MOUTH EVERY DAY    Dispense:  30 tablet    Refill:  0   Follow up: 12/05/2016  Lovenia Kim, MD [place], PGY-1 11/30/2016 5:15 PM

## 2016-11-28 ENCOUNTER — Ambulatory Visit (INDEPENDENT_AMBULATORY_CARE_PROVIDER_SITE_OTHER): Payer: Medicare Other | Admitting: Family

## 2016-11-28 ENCOUNTER — Ambulatory Visit (HOSPITAL_COMMUNITY)
Admission: RE | Admit: 2016-11-28 | Discharge: 2016-11-28 | Disposition: A | Discharge status: Home / Self Care | Payer: Medicare Other | Source: Ambulatory Visit | Attending: Family | Admitting: Family

## 2016-11-28 ENCOUNTER — Encounter: Payer: Self-pay | Admitting: Family

## 2016-11-28 VITALS — BP 174/85 | HR 88 | Temp 97.9°F | Resp 16 | Ht 62.0 in | Wt 141.0 lb

## 2016-11-28 DIAGNOSIS — I739 Peripheral vascular disease, unspecified: Secondary | ICD-10-CM

## 2016-11-28 DIAGNOSIS — I10 Essential (primary) hypertension: Secondary | ICD-10-CM | POA: Insufficient documentation

## 2016-11-28 DIAGNOSIS — E785 Hyperlipidemia, unspecified: Secondary | ICD-10-CM | POA: Insufficient documentation

## 2016-11-28 DIAGNOSIS — L97521 Non-pressure chronic ulcer of other part of left foot limited to breakdown of skin: Secondary | ICD-10-CM

## 2016-11-28 DIAGNOSIS — E1151 Type 2 diabetes mellitus with diabetic peripheral angiopathy without gangrene: Secondary | ICD-10-CM | POA: Diagnosis not present

## 2016-11-28 DIAGNOSIS — R609 Edema, unspecified: Secondary | ICD-10-CM | POA: Diagnosis not present

## 2016-11-28 DIAGNOSIS — I779 Disorder of arteries and arterioles, unspecified: Secondary | ICD-10-CM

## 2016-11-28 NOTE — Patient Instructions (Signed)

## 2016-11-28 NOTE — Progress Notes (Signed)
[facility] [place]   CC: Follow up peripheral artery occlusive disease  History of Present Illness [name] is a 71 y.o. female whom Dr. Meyer Russel referred for evaluation of arterial and venous disease with recent non-healing wound. The patient states she does not really know how the wound first started but it took about a month to heal. She was seen at the wound care center but says she has now been discharged. She denies any claudication symptoms. She denies rest pain. Other medical problems include diabetes, schizotypal personality disorder, hypertension and thyroid disease all of which are stable.  When Dr. Darrick Penna evaluated pt in July 2017, the wound on her foot had nearly healed, but definitely had evidence of peripheral arterial disease.  Venous duplex exam on 01/02/2016 showed no evidence of reflux.  Dr. Darrick Penna advised pt to follow-up with [place] in 6 months time for repeat noninvasive arterial exam. She'll follow-up sooner if she has any worsening of the wound on her foot or a new wound appears. At that point we would consider an arteriogram.   Pt was evaluated at an urgent care facility on 10-06-16 for left great toe ulcer and edema in her feet. Clindamycin prescription was provided. (review of records). Pt was advised to follow up with her PCP and podiatrist in 2-3 days.   She is under wound care treatment at [facility], Dr. Meyer Russel, since March 2018, attends weekly.  Pt states a HH nurse dresses her left foot wound 1-2 other times/week.  She has an appointment on 12-05-16 to see her PCP, states she ran out of all of her medications and will not be able to get prescriptions for them until she sees her PCP, but states there is a prescription for a blood pressure medication at her pharmacy for her to pick up today.   Pt Diabetic: Yes, A1C was 7.7 yesterday (11-27-16)  Pt smoker: non-smoker  Pt meds include: Statin :No Betablocker: No ASA: Yes Other  anticoagulants/antiplatelets: no   Past Medical History:  Diagnosis Date  . Diabetes mellitus age 73  . Hypertension   . Retinal detachment, old, partial    left  . Retinopathy due to secondary diabetes mellitus (HCC)    L>R, laser 3/07  . Schizotypal personality disorder   . Thyroid disease     Social History Social History  Substance Use Topics  . Smoking status: Never Smoker  . Smokeless tobacco: Never Used  . Alcohol use No    Family History Family History  Problem Relation Age of Onset  . Heart disease Mother   . Hypertension Mother   . Lymphoma Sister   . Breast cancer Other   . Colon cancer Brother   . Colon cancer Maternal Grandmother     Past Surgical History:  Procedure Laterality Date  . BREAST EXCISIONAL BIOPSY    . BREAST SURGERY    . CATARACT EXTRACTION     left   . ROTATOR CUFF REPAIR  1990's   left  . Thyroid radiation ablation     for Graves Disease  . TRANSTHORACIC ECHOCARDIOGRAM      EF55-65%, nml - 12/14/2004    Allergies  Allergen Reactions  . Rosiglitazone Maleate Other (See Comments)    REACTION: Difficulty walking, Fatigue, shortness of breath  . Rosiglitazone     Current Outpatient Prescriptions  Medication Sig Dispense Refill  . amLODipine (NORVASC) 10 MG tablet TAKE 1 TABLET BY MOUTH EVERY DAY (Patient not taking: Reported on 11/28/2016)  90 tablet 0  . aspirin EC 325 MG tablet TAKE 1 TABLET BY MOUTH EVERY DAY (Patient not taking: Reported on 11/28/2016) 30 tablet 5  . Blood Glucose Monitoring Suppl (BAYER CONTOUR NEXT MONITOR) w/Device KIT Use to test blood glucose three times daily. ICD-10 code: E11.65. (Patient not taking: Reported on 11/28/2016) 1 kit 0  . cetirizine (ZYRTEC) 10 MG tablet Take 1 tablet (10 mg total) by mouth daily. (Patient not taking: Reported on 11/28/2016) 30 tablet 11  . fluticasone (FLONASE) 50 MCG/ACT nasal spray PLACE 1 SPRAY INTO BOTH NOSTRILS DAILY. (Patient not taking: Reported on 5/80/9983) 16 g 2  .  folic acid (FOLVITE) 1 MG tablet     . furosemide (LASIX) 20 MG tablet Take 1 tablet (20 mg total) by mouth daily. For 2 weeks (Patient not taking: Reported on 11/28/2016) 15 tablet 0  . glucose blood (BAYER CONTOUR NEXT TEST) test strip Use to test blood glucose three times daily. ICD-10 code: E11.65. (Patient not taking: Reported on 11/28/2016) 100 each 12  . hydrochlorothiazide (HYDRODIURIL) 25 MG tablet Take 1 tablet (25 mg total) by mouth daily. (Patient not taking: Reported on 11/28/2016) 30 tablet 0  . Insulin Glargine (LANTUS SOLOSTAR) 100 UNIT/ML Solostar Pen Inject 18 Units into the skin daily. (Patient not taking: Reported on 11/28/2016)    . Lancet Devices (MICROLET NEXT LANCING DEVICE) MISC 1 each by Does not apply route 3 (three) times daily. Check blood sugar 3 times daily (Patient not taking: Reported on 11/28/2016) 1 each 0  . leflunomide (ARAVA) 10 MG tablet     . levothyroxine (SYNTHROID, LEVOTHROID) 50 MCG tablet TAKE 1 TABLET BY MOUTH EVERY DAY (Patient not taking: Reported on 11/28/2016) 30 tablet 0  . metFORMIN (GLUCOPHAGE) 1000 MG tablet Take 1 tablet (1,000 mg total) by mouth 2 (two) times daily with a meal. (Patient not taking: Reported on 11/28/2016) 60 tablet 0  . methotrexate (RHEUMATREX) 2.5 MG tablet 5 tablets by mouth every Monday and Tuesday (Patient not taking: Reported on 11/28/2016) 40 tablet 1  . MICROLET LANCETS MISC Use to test blood glucose three times daily. ICD-10 code: E11.65. (Patient not taking: Reported on 11/28/2016) 100 each 12  . Multiple Vitamin (MULTIVITAMINS PO) 1 tablet by mouth daily     . polyethylene glycol powder (GLYCOLAX/MIRALAX) powder Take 17 g by mouth daily as needed (constipation). (Patient not taking: Reported on 11/28/2016) 255 g 1  . predniSONE (DELTASONE) 10 MG tablet Take 10 mg by mouth daily.    Marland Kitchen PROAIR HFA 108 (90 BASE) MCG/ACT inhaler INHALE 2 PUFFS INTO THE LUNGS EVERY 6 HOURS AS NEEDED FOR WHEEZING OR SHORTNESS OF BREATH (Patient not  taking: Reported on 11/28/2016) 8 g 0  . quinapril (ACCUPRIL) 5 MG tablet TAKE 1 TABLET BY MOUTH EVERY DAY (Patient not taking: Reported on 11/28/2016) 30 tablet 0  . rosuvastatin (CRESTOR) 20 MG tablet Take 1 tablet (20 mg total) by mouth daily. (Patient not taking: Reported on 11/28/2016) 30 tablet 0   No current facility-administered medications for this visit.     ROS: See HPI for pertinent positives and negatives.   Physical Examination  Vitals:   11/28/16 1315 11/28/16 1317  BP: (!) 176/82 (!) 174/85  Pulse: 88   Resp: 16   Temp: 97.9 F (36.6 C)   TempSrc: Oral   SpO2: 99%   Weight: 141 lb (64 kg)   Height: '5\' 2"'$  (1.575 m)    Body mass index is 25.79 kg/m.  General:  A&O x 3, WDWN, female. Gait: using cane Eyes: PERRLA. Pulmonary: Respirations are non labored, CTAB, good air movement Cardiac: regular rhythm, no detected murmur.         Carotid Bruits Right Left   Negative Negative  Aorta is not palpable. Radial pulses: 2+ palpable bilaterally                           VASCULAR EXAM: Extremities with ischemic changes, without Gangrene; with open wounds: Left 3rd toe with shallow ulcer at dorsal aspect, with signs of granulation, no purulence, no erythema. Skin of lower legs and feet with stasis dermatitis, 1-2+ non pitting and pitting edema in both feet, ankles, and lower legs.                                                                                                           LE Pulses Right Left       FEMORAL  faintly palpable  2+ palpable        POPLITEAL  not palpable   not palpable       POSTERIOR TIBIAL  not palpable   not palpable        DORSALIS PEDIS      ANTERIOR TIBIAL 2+ palpable  2+ palpable    Abdomen: soft, NT, no palpable masses. Skin: no rashes, see Extremities Musculoskeletal: no muscle wasting or atrophy.  Neurologic: A&O X 3; Appropriate Affect ; SENSATION: normal; MOTOR FUNCTION:  moving all extremities equally, motor strength 5/5  throughout. Speech is fluent/normal. CN 2-12 intact.    ASSESSMENT: LAI [name] is a 71 y.o. female who presents with: peripheral artery occlusive disease most likely secondary to long term diabetes mellitus. She denies ever using tobacco.  She has a shallow healing ulcer at the dorsal aspect of her left 3rd toe. She seems to have medial calcification of the arteries in her lower legs as the arterial vessels are non compressible, a diabetic complication, giving falsely very elevated ankle pressures that do not correspond to monophasic waveforms.  Arterial ankle waveforms are all monophasic; however, bilateral toe brachial indices are normal and both of her dorsalis pedis pulses are 2+ palpable.   DATA: ABI (11/28/16): Right: [place] ([place] with triphasic waveforms, 03-13-16), waveforms: monophasic; TBI: 0.74 (was 0.82). Left: [place] ( with triphasic waveforms, 03-13-16), waveforms: monophasic; TBI: 0.80 (was 0.70).  Patient had a venous duplex exam 01/02/2016 which showed no evidence of reflux.   She also had a noninvasive arterial exam in May 2017 which showed calcified vessels bilaterally but biphasic to triphasic waveforms and toe pressure of 101 on the left 102 on the right. A repeat of that arterial study in July 2017 showed similar findings   PLAN:  Continued wound care supervised by Dr. Con Memos at [facility] wound care clinic.  I advised her to to safely walk as much as possible, and when not walking to elevate her feet above her heart as often as possible to minimize dependent edema which will facilitate wound healing  in her left 3rd toe.  Based on the patient's vascular studies and examination, pt will return to clinic in 6 months with ABI's. I advised her to notify us if she develops concerns re the circulation in her feet/legs.   I discussed in depth with the patient the nature of atherosclerosis, and emphasized the importance of maximal medical management including strict control of blood  pressure, blood glucose, and lipid levels, obtaining regular exercise, and continued cessation of smoking.  The patient is aware that without maximal medical management the underlying atherosclerotic disease process will progress, limiting the benefit of any interventions.  The patient was given information about PAD including signs, symptoms, treatment, what symptoms should prompt the patient to seek immediate medical care, and risk reduction measures to take.  Clemon Chambers, RN, MSN, FNP-C [facility] Phone: [phone]  Clinic MD: Oneida Alar  11/28/16 1:23 PM

## 2016-11-29 NOTE — Addendum Note (Signed)
Addended by: Lianne Cure A on: 11/29/2016 09:23 AM   Modules accepted: Orders

## 2016-11-30 NOTE — Assessment & Plan Note (Addendum)
BP checked today in both arms.  L arm with 150/80 and R arm 160/82.  Patient does not have refills on medications and was supposed to follow up with PCP on 12/03/2016.   -Refilled Quinapril 5 mg today given her blood pressures are elevated today.  Strongly urged her to keep follow up appointment on 4/19 and will add back other BP meds at that time if appropriate  -Patient to monitor blood pressures at home

## 2016-12-03 ENCOUNTER — Ambulatory Visit: Payer: Medicare Other | Admitting: Family Medicine

## 2016-12-03 DIAGNOSIS — E11621 Type 2 diabetes mellitus with foot ulcer: Secondary | ICD-10-CM | POA: Diagnosis not present

## 2016-12-05 ENCOUNTER — Encounter: Payer: Self-pay | Admitting: Family Medicine

## 2016-12-05 ENCOUNTER — Ambulatory Visit (INDEPENDENT_AMBULATORY_CARE_PROVIDER_SITE_OTHER): Payer: Medicare Other | Admitting: Family Medicine

## 2016-12-05 VITALS — BP 154/88 | HR 66 | Temp 97.6°F | Ht 62.0 in | Wt 145.0 lb

## 2016-12-05 DIAGNOSIS — Z794 Long term (current) use of insulin: Secondary | ICD-10-CM | POA: Diagnosis not present

## 2016-12-05 DIAGNOSIS — E118 Type 2 diabetes mellitus with unspecified complications: Secondary | ICD-10-CM

## 2016-12-05 DIAGNOSIS — IMO0002 Reserved for concepts with insufficient information to code with codable children: Secondary | ICD-10-CM

## 2016-12-05 DIAGNOSIS — E1165 Type 2 diabetes mellitus with hyperglycemia: Secondary | ICD-10-CM | POA: Diagnosis not present

## 2016-12-05 DIAGNOSIS — I10 Essential (primary) hypertension: Secondary | ICD-10-CM | POA: Diagnosis not present

## 2016-12-05 MED ORDER — MICROLET LANCETS MISC: 12 refills | Status: DC

## 2016-12-05 MED ORDER — MICROLET NEXT LANCING DEVICE MISC: 1.0000 | Freq: Three times a day (TID) | 0 refills | Status: DC

## 2016-12-05 MED ORDER — BAYER CONTOUR NEXT MONITOR W/DEVICE KIT: PACK | 0 refills | Status: DC

## 2016-12-05 MED ORDER — HYDROCHLOROTHIAZIDE 12.5 MG PO TABS: 12.5000 mg | ORAL_TABLET | Freq: Every day | ORAL | 0 refills | Status: DC

## 2016-12-05 NOTE — Assessment & Plan Note (Signed)
Patient asked for new prescription for glucometer at the end of the visit Management was not discussed She reports she is going to see her PCP soon to discuss management

## 2016-12-05 NOTE — Progress Notes (Signed)
   Subjective:   [name] is a 71 y.o. female with a history of HTN, GERD, hypothyroidism, T2 DM with retinopathy and peripheral neuropathy, RA here for hypertention follow-up  HTN: - Medications: quinapril 5 mg daily - previously on Amlodipine 10 mg daily, HCTZ 25 mg daily as well - Compliance: good - daily - Checking BP at home: no - Denies any SOB, CP, vision changes, LE edema, medication SEs, or symptoms of hypotension - Diet: doesn't add extra salt to foods - Exercise: none - seen 11/28/16 with elevated BP after running out of all 3 medications - restarted quinapril 5 mg daily  Review of Systems:  Per HPI.   Social History: never smoker  Objective:  BP (!) 154/88   Pulse 66   Temp 97.6 F (36.4 C) (Oral)   Ht 5\' 2"  (1.575 m)   Wt 145 lb (65.8 kg)   SpO2 97%   BMI 26.52 kg/m   Gen:  71 y.o. female in NAD HEENT: NCAT, MMM, anicteric sclerae CV: RRR, no MRG Resp: Non-labored, CTAB, no wheezes noted Ext: WWP, 1+ edema in b/l ankles, wearing compression stockings MSK: walks with cane Neuro: Alert, speech normal       Chemistry      Component Value Date/Time   NA 138 11/25/2016 1049   K 4.6 11/25/2016 1049   CL 101 11/25/2016 1049   CO2 27 11/25/2016 1049   BUN 13 11/25/2016 1049   CREATININE 0.99 11/25/2016 1049   CREATININE 0.82 07/05/2014 1606      Component Value Date/Time   CALCIUM 9.1 11/25/2016 1049   ALKPHOS 73 11/25/2016 1049   AST 12 11/25/2016 1049   ALT 7 11/25/2016 1049   BILITOT 0.4 11/25/2016 1049      Lab Results  Component Value Date   WBC 5.3 01/22/2012   HGB 11.5 (L) 01/22/2012   HCT 36.7 01/22/2012   MCV 85.2 01/22/2012   PLT 335 01/22/2012   Lab Results  Component Value Date   TSH 8.203 (H) 07/11/2014   Lab Results  Component Value Date   HGBA1C 7.7 11/27/2016   Assessment & Plan:     [name] is a 71 y.o. female here for   HYPERTENSION, BENIGN SYSTEMIC Blood pressure remains elevated above goal  today Continue quinapril 5 mg daily Restart HCTZ at low dose of 12.5 mg daily Continue to hold amlodipine at this time HCTZ favored over amlodipine given lower extremity edema Advised her to follow-up with her primary care doctor in 2 weeks Consider rechecking BMP at that visit  DM (diabetes mellitus), type 2, uncontrolled ([name]) Patient asked for new prescription for glucometer at the end of the visit Management was not discussed She reports she is going to see her PCP soon to discuss management   Virginia Crews, MD MPH PGY-3,  [facility] 12/05/2016  1:56 PM

## 2016-12-05 NOTE — Assessment & Plan Note (Signed)
Blood pressure remains elevated above goal today Continue quinapril 5 mg daily Restart HCTZ at low dose of 12.5 mg daily Continue to hold amlodipine at this time HCTZ favored over amlodipine given lower extremity edema Advised her to follow-up with her primary care doctor in 2 weeks Consider rechecking BMP at that visit

## 2016-12-05 NOTE — Patient Instructions (Signed)
Nice to meet you today. Your blood pressure is better than it was last time, but still not at goal. Continue your quinapril daily. Start HCTZ 12.5 mg daily as well. Follow-up with your primary care doctor in 2 weeks check on your blood pressure again in follow-up on your other medical conditions.  Take care, Dr. Jacinto Reap

## 2016-12-06 ENCOUNTER — Encounter: Payer: Self-pay | Admitting: Family Medicine

## 2016-12-10 DIAGNOSIS — E11621 Type 2 diabetes mellitus with foot ulcer: Secondary | ICD-10-CM | POA: Diagnosis not present

## 2016-12-17 ENCOUNTER — Encounter (HOSPITAL_BASED_OUTPATIENT_CLINIC_OR_DEPARTMENT_OTHER): Payer: Medicare Other | Attending: Surgery

## 2016-12-17 DIAGNOSIS — L97822 Non-pressure chronic ulcer of other part of left lower leg with fat layer exposed: Secondary | ICD-10-CM | POA: Diagnosis not present

## 2016-12-17 DIAGNOSIS — E11621 Type 2 diabetes mellitus with foot ulcer: Secondary | ICD-10-CM | POA: Diagnosis not present

## 2016-12-17 DIAGNOSIS — L84 Corns and callosities: Secondary | ICD-10-CM | POA: Insufficient documentation

## 2016-12-17 DIAGNOSIS — I890 Lymphedema, not elsewhere classified: Secondary | ICD-10-CM | POA: Diagnosis not present

## 2016-12-17 DIAGNOSIS — M069 Rheumatoid arthritis, unspecified: Secondary | ICD-10-CM | POA: Diagnosis not present

## 2016-12-17 DIAGNOSIS — L97422 Non-pressure chronic ulcer of left heel and midfoot with fat layer exposed: Secondary | ICD-10-CM | POA: Diagnosis not present

## 2016-12-17 DIAGNOSIS — L97522 Non-pressure chronic ulcer of other part of left foot with fat layer exposed: Secondary | ICD-10-CM | POA: Diagnosis not present

## 2016-12-17 DIAGNOSIS — I10 Essential (primary) hypertension: Secondary | ICD-10-CM | POA: Insufficient documentation

## 2016-12-24 DIAGNOSIS — L97522 Non-pressure chronic ulcer of other part of left foot with fat layer exposed: Secondary | ICD-10-CM | POA: Diagnosis not present

## 2016-12-24 DIAGNOSIS — I10 Essential (primary) hypertension: Secondary | ICD-10-CM | POA: Diagnosis not present

## 2016-12-24 DIAGNOSIS — L84 Corns and callosities: Secondary | ICD-10-CM | POA: Diagnosis not present

## 2016-12-24 DIAGNOSIS — E11621 Type 2 diabetes mellitus with foot ulcer: Secondary | ICD-10-CM | POA: Diagnosis not present

## 2016-12-24 DIAGNOSIS — L97422 Non-pressure chronic ulcer of left heel and midfoot with fat layer exposed: Secondary | ICD-10-CM | POA: Diagnosis not present

## 2016-12-24 DIAGNOSIS — I890 Lymphedema, not elsewhere classified: Secondary | ICD-10-CM | POA: Diagnosis not present

## 2016-12-26 DIAGNOSIS — E039 Hypothyroidism, unspecified: Secondary | ICD-10-CM | POA: Diagnosis not present

## 2016-12-26 DIAGNOSIS — I890 Lymphedema, not elsewhere classified: Secondary | ICD-10-CM | POA: Diagnosis not present

## 2016-12-26 DIAGNOSIS — M1990 Unspecified osteoarthritis, unspecified site: Secondary | ICD-10-CM | POA: Diagnosis not present

## 2016-12-26 DIAGNOSIS — I10 Essential (primary) hypertension: Secondary | ICD-10-CM | POA: Diagnosis not present

## 2016-12-26 DIAGNOSIS — E11621 Type 2 diabetes mellitus with foot ulcer: Secondary | ICD-10-CM | POA: Diagnosis not present

## 2016-12-26 DIAGNOSIS — M8580 Other specified disorders of bone density and structure, unspecified site: Secondary | ICD-10-CM | POA: Diagnosis not present

## 2016-12-26 DIAGNOSIS — L97411 Non-pressure chronic ulcer of right heel and midfoot limited to breakdown of skin: Secondary | ICD-10-CM | POA: Diagnosis not present

## 2016-12-26 DIAGNOSIS — E1140 Type 2 diabetes mellitus with diabetic neuropathy, unspecified: Secondary | ICD-10-CM | POA: Diagnosis not present

## 2016-12-26 DIAGNOSIS — E11319 Type 2 diabetes mellitus with unspecified diabetic retinopathy without macular edema: Secondary | ICD-10-CM | POA: Diagnosis not present

## 2016-12-27 ENCOUNTER — Other Ambulatory Visit: Payer: Self-pay | Admitting: Family Medicine

## 2016-12-30 ENCOUNTER — Encounter: Payer: Self-pay | Admitting: Family Medicine

## 2016-12-30 ENCOUNTER — Ambulatory Visit (INDEPENDENT_AMBULATORY_CARE_PROVIDER_SITE_OTHER): Payer: Medicare Other | Admitting: Family Medicine

## 2016-12-30 VITALS — BP 150/80 | HR 90 | Temp 98.0°F | Ht 62.0 in | Wt 140.8 lb

## 2016-12-30 DIAGNOSIS — E11319 Type 2 diabetes mellitus with unspecified diabetic retinopathy without macular edema: Secondary | ICD-10-CM | POA: Diagnosis not present

## 2016-12-30 DIAGNOSIS — E1140 Type 2 diabetes mellitus with diabetic neuropathy, unspecified: Secondary | ICD-10-CM | POA: Diagnosis not present

## 2016-12-30 DIAGNOSIS — I10 Essential (primary) hypertension: Secondary | ICD-10-CM

## 2016-12-30 DIAGNOSIS — E11621 Type 2 diabetes mellitus with foot ulcer: Secondary | ICD-10-CM | POA: Diagnosis not present

## 2016-12-30 DIAGNOSIS — I890 Lymphedema, not elsewhere classified: Secondary | ICD-10-CM | POA: Diagnosis not present

## 2016-12-30 DIAGNOSIS — L97411 Non-pressure chronic ulcer of right heel and midfoot limited to breakdown of skin: Secondary | ICD-10-CM | POA: Diagnosis not present

## 2016-12-30 DIAGNOSIS — I779 Disorder of arteries and arterioles, unspecified: Secondary | ICD-10-CM

## 2016-12-30 NOTE — Patient Instructions (Signed)
It was nice seeing you today.  You were seen in clinic for follow up for your high blood pressures. Your blood pressure at this visit was 150/80 which is higher than we would like.  You can continue to take your Quinapril 5 mg daily, and HCTZ 12.5 mg daily.  No changes have been made to your blood pressure medications at this time however you should follow up in 2-3 weeks time to be rechecked and at that visit we may adjust your medications.   As we discussed, I would like you to cut down on the amount of salt you eat in your diet.  This can also help with lowering your blood pressures.

## 2016-12-30 NOTE — Progress Notes (Signed)
   Subjective:   Patient ID: DAYANIRA GIOVANNETTI    DOB: [birth year], 71 y.o. female   MRN: [id number]  CC: blood pressure follow up  HPI: AARYANA BETKE is a 71 y.o. female with history of HTN, GERD, hypothyroidism, T2 DM with retinopathy and peripheral neuropathy, RA here for hypertension follow-up.   HTN Last seen by Dr. Brita Romp on 4/19. Elevated BP to 154/88.  At this time her HCTZ was restarted at a lower dose of 12.5 mg daily. -Does not check her blood pressures at home.   -Meds: Quinapril 5 mg daily and HCTZ 12.5 mg daily.   -Compliance:  Good, reports she takes medications daily.  Did not take it today though because HCTZ makes her go to the bathroom a lot and she would be out of the house all day for errands/doctor's visit.  -Symptoms:  Denies SOB, C/P, vision changes.  Reports some mild leg swelling.  Denies dizziness.  -Side effects from medications : noticed she is peeing more frequently with HCTZ  -Diet: eats salty things ie potato chips.  Doesn't add extra salt to foods.  Has not tried to cut back on eating salt as she is unaware this can raise blood pressures.   -Exercise: none   PMFSH: Pertinent past medical, surgical, family, and social history were reviewed and updated as appropriate. Smoking status reviewed.  Medications reviewed.  Objective:   BP (!) 150/80   Pulse 90   Temp 98 F (36.7 C) (Oral)   Ht 5\' 2"  (1.575 m)   Wt 140 lb 12.8 oz (63.9 kg)   SpO2 99%   BMI 25.75 kg/m  Vitals and nursing note reviewed.  General: 71 yo F, well nourished, NAD  HEENT: NCAT, MMM, o/p clear  Neck: supple  CV:  RRR no MRG, palpable pulses  Lungs: CTAB, normal work of breathing  Abdomen: soft, NTND, +bs Skin: warm, dry Extremities: trace LE edema noted bilaterally   Assessment & Plan:   HYPERTENSION, BENIGN SYSTEMIC -Blood pressure remains elevated today at 150/80.  -Continue HCTZ 12.5 mg and quinapril 5 mg daily. - Will continue to hold amlodipine at this time in  setting of lower extremity edema.   -Patient encouraged to cut down on salt intake.   -Encouraged compliance with antihypertensive medications -Advised her to follow-up with her primary care doctor in 2-3 weeks for medication management/adjustment.  -If BP remains elevated at next visit can increase HCTZ to 25 mg daily  -BMET ordered today   Orders Placed This Encounter  Procedures  . Basic Metabolic Panel   Follow up: 2-3 weeks   Lovenia Kim, MD [place], PGY-1 12/30/2016 5:32 PM

## 2016-12-30 NOTE — Assessment & Plan Note (Addendum)
-  Blood pressure remains elevated today at 150/80.  -Continue HCTZ 12.5 mg and quinapril 5 mg daily. - Will continue to hold amlodipine at this time in setting of lower extremity edema.   -Patient encouraged to cut down on salt intake.   -Encouraged compliance with antihypertensive medications -Advised her to follow-up with her primary care doctor in 2-3 weeks for medication management/adjustment.  -If BP remains elevated at next visit can increase HCTZ to 25 mg daily  -BMET ordered today

## 2016-12-31 DIAGNOSIS — I890 Lymphedema, not elsewhere classified: Secondary | ICD-10-CM | POA: Diagnosis not present

## 2016-12-31 DIAGNOSIS — L97522 Non-pressure chronic ulcer of other part of left foot with fat layer exposed: Secondary | ICD-10-CM | POA: Diagnosis not present

## 2016-12-31 DIAGNOSIS — I10 Essential (primary) hypertension: Secondary | ICD-10-CM | POA: Diagnosis not present

## 2016-12-31 DIAGNOSIS — L97422 Non-pressure chronic ulcer of left heel and midfoot with fat layer exposed: Secondary | ICD-10-CM | POA: Diagnosis not present

## 2016-12-31 DIAGNOSIS — L84 Corns and callosities: Secondary | ICD-10-CM | POA: Diagnosis not present

## 2016-12-31 DIAGNOSIS — E11621 Type 2 diabetes mellitus with foot ulcer: Secondary | ICD-10-CM | POA: Diagnosis not present

## 2017-01-02 DIAGNOSIS — I890 Lymphedema, not elsewhere classified: Secondary | ICD-10-CM | POA: Diagnosis not present

## 2017-01-02 DIAGNOSIS — E11319 Type 2 diabetes mellitus with unspecified diabetic retinopathy without macular edema: Secondary | ICD-10-CM | POA: Diagnosis not present

## 2017-01-02 DIAGNOSIS — E11621 Type 2 diabetes mellitus with foot ulcer: Secondary | ICD-10-CM | POA: Diagnosis not present

## 2017-01-02 DIAGNOSIS — E1140 Type 2 diabetes mellitus with diabetic neuropathy, unspecified: Secondary | ICD-10-CM | POA: Diagnosis not present

## 2017-01-02 DIAGNOSIS — L97411 Non-pressure chronic ulcer of right heel and midfoot limited to breakdown of skin: Secondary | ICD-10-CM | POA: Diagnosis not present

## 2017-01-02 DIAGNOSIS — I10 Essential (primary) hypertension: Secondary | ICD-10-CM | POA: Diagnosis not present

## 2017-01-05 ENCOUNTER — Other Ambulatory Visit: Payer: Self-pay | Admitting: Family Medicine

## 2017-01-06 DIAGNOSIS — E11621 Type 2 diabetes mellitus with foot ulcer: Secondary | ICD-10-CM | POA: Diagnosis not present

## 2017-01-06 DIAGNOSIS — I10 Essential (primary) hypertension: Secondary | ICD-10-CM | POA: Diagnosis not present

## 2017-01-06 DIAGNOSIS — L97411 Non-pressure chronic ulcer of right heel and midfoot limited to breakdown of skin: Secondary | ICD-10-CM | POA: Diagnosis not present

## 2017-01-06 DIAGNOSIS — E1140 Type 2 diabetes mellitus with diabetic neuropathy, unspecified: Secondary | ICD-10-CM | POA: Diagnosis not present

## 2017-01-06 DIAGNOSIS — I890 Lymphedema, not elsewhere classified: Secondary | ICD-10-CM | POA: Diagnosis not present

## 2017-01-06 DIAGNOSIS — E11319 Type 2 diabetes mellitus with unspecified diabetic retinopathy without macular edema: Secondary | ICD-10-CM | POA: Diagnosis not present

## 2017-01-07 DIAGNOSIS — L97422 Non-pressure chronic ulcer of left heel and midfoot with fat layer exposed: Secondary | ICD-10-CM | POA: Diagnosis not present

## 2017-01-07 DIAGNOSIS — E11621 Type 2 diabetes mellitus with foot ulcer: Secondary | ICD-10-CM | POA: Diagnosis not present

## 2017-01-07 DIAGNOSIS — I10 Essential (primary) hypertension: Secondary | ICD-10-CM | POA: Diagnosis not present

## 2017-01-07 DIAGNOSIS — L84 Corns and callosities: Secondary | ICD-10-CM | POA: Diagnosis not present

## 2017-01-07 DIAGNOSIS — I890 Lymphedema, not elsewhere classified: Secondary | ICD-10-CM | POA: Diagnosis not present

## 2017-01-07 DIAGNOSIS — L97522 Non-pressure chronic ulcer of other part of left foot with fat layer exposed: Secondary | ICD-10-CM | POA: Diagnosis not present

## 2017-01-13 DIAGNOSIS — E11621 Type 2 diabetes mellitus with foot ulcer: Secondary | ICD-10-CM | POA: Diagnosis not present

## 2017-01-13 DIAGNOSIS — L97411 Non-pressure chronic ulcer of right heel and midfoot limited to breakdown of skin: Secondary | ICD-10-CM | POA: Diagnosis not present

## 2017-01-13 DIAGNOSIS — I890 Lymphedema, not elsewhere classified: Secondary | ICD-10-CM | POA: Diagnosis not present

## 2017-01-13 DIAGNOSIS — E1140 Type 2 diabetes mellitus with diabetic neuropathy, unspecified: Secondary | ICD-10-CM | POA: Diagnosis not present

## 2017-01-13 DIAGNOSIS — E11319 Type 2 diabetes mellitus with unspecified diabetic retinopathy without macular edema: Secondary | ICD-10-CM | POA: Diagnosis not present

## 2017-01-13 DIAGNOSIS — I10 Essential (primary) hypertension: Secondary | ICD-10-CM | POA: Diagnosis not present

## 2017-01-14 ENCOUNTER — Encounter: Payer: Self-pay | Admitting: Family Medicine

## 2017-01-14 ENCOUNTER — Ambulatory Visit (INDEPENDENT_AMBULATORY_CARE_PROVIDER_SITE_OTHER): Payer: Medicare Other | Admitting: Family Medicine

## 2017-01-14 VITALS — BP 164/82 | HR 96 | Temp 98.1°F | Wt 139.0 lb

## 2017-01-14 DIAGNOSIS — I10 Essential (primary) hypertension: Secondary | ICD-10-CM

## 2017-01-14 DIAGNOSIS — E118 Type 2 diabetes mellitus with unspecified complications: Secondary | ICD-10-CM | POA: Diagnosis not present

## 2017-01-14 DIAGNOSIS — E1165 Type 2 diabetes mellitus with hyperglycemia: Secondary | ICD-10-CM

## 2017-01-14 DIAGNOSIS — L84 Corns and callosities: Secondary | ICD-10-CM | POA: Diagnosis not present

## 2017-01-14 DIAGNOSIS — E039 Hypothyroidism, unspecified: Secondary | ICD-10-CM | POA: Diagnosis not present

## 2017-01-14 DIAGNOSIS — L97522 Non-pressure chronic ulcer of other part of left foot with fat layer exposed: Secondary | ICD-10-CM | POA: Diagnosis not present

## 2017-01-14 DIAGNOSIS — E8809 Other disorders of plasma-protein metabolism, not elsewhere classified: Secondary | ICD-10-CM

## 2017-01-14 DIAGNOSIS — Z794 Long term (current) use of insulin: Secondary | ICD-10-CM

## 2017-01-14 DIAGNOSIS — Z23 Encounter for immunization: Secondary | ICD-10-CM

## 2017-01-14 DIAGNOSIS — I779 Disorder of arteries and arterioles, unspecified: Secondary | ICD-10-CM

## 2017-01-14 DIAGNOSIS — I890 Lymphedema, not elsewhere classified: Secondary | ICD-10-CM | POA: Diagnosis not present

## 2017-01-14 DIAGNOSIS — R779 Abnormality of plasma protein, unspecified: Secondary | ICD-10-CM

## 2017-01-14 DIAGNOSIS — E11621 Type 2 diabetes mellitus with foot ulcer: Secondary | ICD-10-CM | POA: Diagnosis not present

## 2017-01-14 DIAGNOSIS — L97422 Non-pressure chronic ulcer of left heel and midfoot with fat layer exposed: Secondary | ICD-10-CM | POA: Diagnosis not present

## 2017-01-14 DIAGNOSIS — IMO0002 Reserved for concepts with insufficient information to code with codable children: Secondary | ICD-10-CM

## 2017-01-14 MED ORDER — TETANUS-DIPHTH-ACELL PERTUSSIS 5-2.5-18.5 LF-MCG/0.5 IM SUSP
0.5000 mL | Freq: Once | INTRAMUSCULAR | 0 refills | Status: AC
Start: 2017-01-14 — End: 2017-01-14

## 2017-01-14 MED ORDER — ACCU-CHEK AVIVA DEVI: 0 refills | Status: DC

## 2017-01-14 MED ORDER — HYDROCHLOROTHIAZIDE 25 MG PO TABS: 25.0000 mg | ORAL_TABLET | Freq: Every day | ORAL | 0 refills | Status: DC

## 2017-01-14 NOTE — Patient Instructions (Addendum)
Blood pressure: - increase hctz to 25mg  daily - follow up in 1 week for nurse blood pressure check & lab visit  Health maintenance: - See the handout on how to schedule your mammogram. This is an important test to screen for breast cancer. - See the handout on how to schedule your colonoscopy. This is an important screening test for colon cancer.  - schedule your eye exam - pneumonia shot today - tetanus prescription given - get this at your pharmacy  Diabetes: - wait until we get kidney function back. Will probably restart metformin. I will call you with results & decision - sent in new blood sugar meter  Thyroid: - checking thyroid labs today. I will likely restart your medications   Be well, Dr. [name]

## 2017-01-14 NOTE — Progress Notes (Signed)
Date of Visit: 01/14/2017   HPI:  Patient presents for routine follow up. It has been a while since I have seen her, but in the interim she's seen Drs. Amin and [employer].  Diabetes - has been out of diabetes medications for 1 year she thinks. Feels well. No complaints.   Hypothyroidism - has been out of synthroid for about a year she says. Overall feeling well.  Hypertension - taking HCTZ 12.5mg  daily and quinapril 5mg  daily. Tolerating these well. No longer on amlodipine due to lower extremity edema   ROS: See HPI.  [place]: history of hypothyroidism, type 2 diabetes, hypertension, rheumatoid arthritis  PHYSICAL EXAM: BP (!) 160/78   Pulse 96   Temp 98.1 F (36.7 C) (Oral)   Wt 139 lb (63 kg)   BMI 25.42 kg/m  Gen: no acute distress, pleasant, cooperative HEENT: normocephalic, atraumatic, moist mucous membranes  Heart: regular rate and rhythm, no murmur Lungs: clear to auscultation bilaterally, normal work of breathing  Neuro: alert, grossly nonfocal, speech normal Ext: minimal lower extremity edema bilaterally  ASSESSMENT/PLAN:  Health maintenance:  -patient will schedule eye exam -given handouts on scheduling mammogram and colonoscopy -PCV13 given today - given rx for Tdap, confirmed it is due in [facility] -foot exam - follows with podiatry, will enter results of exam into epic from podiatry visit  Diabetes: A1c on 4/11 was 7.2, much improved despite being out of insulin for 1 year - will check BMET today to assess renal function - if renal function stable will restart metformin alone & see how A1c does on this - patient advised to schedule eye exam  Hypertension: Remains uncontrolled Increase HCTZ to 25mg  daily Check labs today, return in 1 week for another BMET & RN visit.  Hypothyroidism: Check TSH. Anticipate will be high as patient out of medication for 1 year.  Polyclonal gamma globulin increase  nonspecific, likely related to her known rheumatoid  arthritis Check CBC today. If anemic may need serum IFE (discussed previously with hematology)  FOLLOW UP: Follow up in 1 week for RN BP check & lab visit  [place] J. [name], [place]

## 2017-01-15 LAB — CBC
Hematocrit: 27.1 % — ABNORMAL LOW (ref 34.0–46.6)
Hemoglobin: 8.4 g/dL — ABNORMAL LOW (ref 11.1–15.9)
MCH: 24.9 pg — ABNORMAL LOW (ref 26.6–33.0)
MCHC: 31 g/dL — ABNORMAL LOW (ref 31.5–35.7)
MCV: 80 fL (ref 79–97)
PLATELETS: 365 10*3/uL (ref 150–379)
RBC: 3.37 x10E6/uL — ABNORMAL LOW (ref 3.77–5.28)
RDW: 17.8 % — AB (ref 12.3–15.4)
WBC: 4.4 10*3/uL (ref 3.4–10.8)

## 2017-01-15 LAB — BASIC METABOLIC PANEL
BUN / CREAT RATIO: 23 (ref 12–28)
BUN: 25 mg/dL (ref 8–27)
CHLORIDE: 105 mmol/L (ref 96–106)
CO2: 24 mmol/L (ref 18–29)
Calcium: 9.3 mg/dL (ref 8.7–10.3)
Creatinine, Ser: 1.07 mg/dL — ABNORMAL HIGH (ref 0.57–1.00)
GFR calc non Af Amer: 52 mL/min/{1.73_m2} — ABNORMAL LOW (ref >59)
GFR, EST AFRICAN AMERICAN: 60 mL/min/{1.73_m2} (ref >59)
GLUCOSE: 93 mg/dL (ref 65–99)
POTASSIUM: 4 mmol/L (ref 3.5–5.2)
SODIUM: 139 mmol/L (ref 134–144)

## 2017-01-15 LAB — T4, FREE: FREE T4: 0.96 ng/dL (ref 0.82–1.77)

## 2017-01-15 LAB — T3: T3, Total: 94 ng/dL (ref 71–180)

## 2017-01-15 LAB — TSH: TSH: 11.33 u[IU]/mL — AB (ref 0.450–4.500)

## 2017-01-16 DIAGNOSIS — I890 Lymphedema, not elsewhere classified: Secondary | ICD-10-CM | POA: Diagnosis not present

## 2017-01-16 DIAGNOSIS — L97411 Non-pressure chronic ulcer of right heel and midfoot limited to breakdown of skin: Secondary | ICD-10-CM | POA: Diagnosis not present

## 2017-01-16 DIAGNOSIS — E11621 Type 2 diabetes mellitus with foot ulcer: Secondary | ICD-10-CM | POA: Diagnosis not present

## 2017-01-16 DIAGNOSIS — E1140 Type 2 diabetes mellitus with diabetic neuropathy, unspecified: Secondary | ICD-10-CM | POA: Diagnosis not present

## 2017-01-16 DIAGNOSIS — E11319 Type 2 diabetes mellitus with unspecified diabetic retinopathy without macular edema: Secondary | ICD-10-CM | POA: Diagnosis not present

## 2017-01-16 DIAGNOSIS — I10 Essential (primary) hypertension: Secondary | ICD-10-CM | POA: Diagnosis not present

## 2017-01-18 DIAGNOSIS — L97411 Non-pressure chronic ulcer of right heel and midfoot limited to breakdown of skin: Secondary | ICD-10-CM | POA: Diagnosis not present

## 2017-01-18 DIAGNOSIS — M1990 Unspecified osteoarthritis, unspecified site: Secondary | ICD-10-CM | POA: Diagnosis not present

## 2017-01-18 DIAGNOSIS — M8580 Other specified disorders of bone density and structure, unspecified site: Secondary | ICD-10-CM | POA: Diagnosis not present

## 2017-01-18 DIAGNOSIS — I10 Essential (primary) hypertension: Secondary | ICD-10-CM | POA: Diagnosis not present

## 2017-01-18 DIAGNOSIS — E039 Hypothyroidism, unspecified: Secondary | ICD-10-CM | POA: Diagnosis not present

## 2017-01-18 DIAGNOSIS — K219 Gastro-esophageal reflux disease without esophagitis: Secondary | ICD-10-CM | POA: Diagnosis not present

## 2017-01-18 DIAGNOSIS — E11319 Type 2 diabetes mellitus with unspecified diabetic retinopathy without macular edema: Secondary | ICD-10-CM | POA: Diagnosis not present

## 2017-01-18 DIAGNOSIS — I890 Lymphedema, not elsewhere classified: Secondary | ICD-10-CM | POA: Diagnosis not present

## 2017-01-18 DIAGNOSIS — E11621 Type 2 diabetes mellitus with foot ulcer: Secondary | ICD-10-CM | POA: Diagnosis not present

## 2017-01-18 DIAGNOSIS — Z9181 History of falling: Secondary | ICD-10-CM | POA: Diagnosis not present

## 2017-01-18 DIAGNOSIS — E1140 Type 2 diabetes mellitus with diabetic neuropathy, unspecified: Secondary | ICD-10-CM | POA: Diagnosis not present

## 2017-01-20 DIAGNOSIS — L97411 Non-pressure chronic ulcer of right heel and midfoot limited to breakdown of skin: Secondary | ICD-10-CM | POA: Diagnosis not present

## 2017-01-20 DIAGNOSIS — I890 Lymphedema, not elsewhere classified: Secondary | ICD-10-CM | POA: Diagnosis not present

## 2017-01-20 DIAGNOSIS — E039 Hypothyroidism, unspecified: Secondary | ICD-10-CM | POA: Diagnosis not present

## 2017-01-20 DIAGNOSIS — Z9181 History of falling: Secondary | ICD-10-CM | POA: Diagnosis not present

## 2017-01-20 DIAGNOSIS — E11621 Type 2 diabetes mellitus with foot ulcer: Secondary | ICD-10-CM | POA: Diagnosis not present

## 2017-01-20 DIAGNOSIS — M8580 Other specified disorders of bone density and structure, unspecified site: Secondary | ICD-10-CM | POA: Diagnosis not present

## 2017-01-20 DIAGNOSIS — I10 Essential (primary) hypertension: Secondary | ICD-10-CM | POA: Diagnosis not present

## 2017-01-20 DIAGNOSIS — K219 Gastro-esophageal reflux disease without esophagitis: Secondary | ICD-10-CM | POA: Diagnosis not present

## 2017-01-20 DIAGNOSIS — M1990 Unspecified osteoarthritis, unspecified site: Secondary | ICD-10-CM | POA: Diagnosis not present

## 2017-01-20 DIAGNOSIS — E1140 Type 2 diabetes mellitus with diabetic neuropathy, unspecified: Secondary | ICD-10-CM | POA: Diagnosis not present

## 2017-01-20 DIAGNOSIS — E11319 Type 2 diabetes mellitus with unspecified diabetic retinopathy without macular edema: Secondary | ICD-10-CM | POA: Diagnosis not present

## 2017-01-21 ENCOUNTER — Ambulatory Visit (INDEPENDENT_AMBULATORY_CARE_PROVIDER_SITE_OTHER): Payer: Medicare Other | Admitting: *Deleted

## 2017-01-21 ENCOUNTER — Encounter (HOSPITAL_BASED_OUTPATIENT_CLINIC_OR_DEPARTMENT_OTHER): Payer: Medicare Other | Attending: Surgery

## 2017-01-21 ENCOUNTER — Other Ambulatory Visit: Payer: Medicare Other

## 2017-01-21 ENCOUNTER — Telehealth: Payer: Self-pay | Admitting: Family Medicine

## 2017-01-21 VITALS — BP 154/92 | HR 74

## 2017-01-21 DIAGNOSIS — M069 Rheumatoid arthritis, unspecified: Secondary | ICD-10-CM | POA: Diagnosis not present

## 2017-01-21 DIAGNOSIS — I10 Essential (primary) hypertension: Secondary | ICD-10-CM

## 2017-01-21 DIAGNOSIS — I890 Lymphedema, not elsewhere classified: Secondary | ICD-10-CM | POA: Diagnosis not present

## 2017-01-21 DIAGNOSIS — L84 Corns and callosities: Secondary | ICD-10-CM | POA: Insufficient documentation

## 2017-01-21 DIAGNOSIS — L97422 Non-pressure chronic ulcer of left heel and midfoot with fat layer exposed: Secondary | ICD-10-CM | POA: Diagnosis not present

## 2017-01-21 DIAGNOSIS — E11621 Type 2 diabetes mellitus with foot ulcer: Secondary | ICD-10-CM | POA: Insufficient documentation

## 2017-01-21 DIAGNOSIS — D649 Anemia, unspecified: Secondary | ICD-10-CM

## 2017-01-21 MED ORDER — LEVOTHYROXINE SODIUM 25 MCG PO TABS: 25.0000 ug | ORAL_TABLET | Freq: Every day | ORAL | 1 refills | Status: DC

## 2017-01-21 NOTE — Telephone Encounter (Signed)
Also, noted that TSH elevated >10 but T3 and free T4 normal. Suggestive of subclinical hypothyroidism, but given TSH, age, and other risk factors for systemic illness treatment is warranted.  Will start low-dose synthroid 70mcg daily. Will need f/u TSH in 6 weeks to recheck. Goal TSH will be 3 to 6 mU/L.  I tried to call patient to let her know this, as we did not discuss this this AM, no answer but left voicemail asking her to call back.  When she calls back please tell her: - I sent in a thyroid pill for her to take - need to recheck thyroid in 6 weeks  Thanks [name], MD

## 2017-01-21 NOTE — Telephone Encounter (Addendum)
Results from last week discussed with patient today at her lab visit.  - Anemic - will check iron studies, A68, folic acid. Also needs serum IFE given anemia with gamma globulinopathy. Patient denies having blood in her stool. Does feel tired which she says she thought was due to "laziness". - Recheck CMET today, also check CBC again. - wait to see what repeat renal function is before deciding on metformin  Stressed importance of scheduling colonoscopy with patient, as has been previously recommended to her. She will do this.  [name], MD

## 2017-01-21 NOTE — Addendum Note (Signed)
Addended by: [name] on: 01/21/2017 06:26 PM   Modules accepted: Orders

## 2017-01-21 NOTE — Progress Notes (Signed)
Patient in today for blood pressure check. Blood pressure taken manually in right arm was 154/92. Patient reports taking bp medication about twenty minutes prior to coming into office.

## 2017-01-23 DIAGNOSIS — M1990 Unspecified osteoarthritis, unspecified site: Secondary | ICD-10-CM | POA: Diagnosis not present

## 2017-01-23 DIAGNOSIS — L97411 Non-pressure chronic ulcer of right heel and midfoot limited to breakdown of skin: Secondary | ICD-10-CM | POA: Diagnosis not present

## 2017-01-23 DIAGNOSIS — E039 Hypothyroidism, unspecified: Secondary | ICD-10-CM | POA: Diagnosis not present

## 2017-01-23 DIAGNOSIS — I890 Lymphedema, not elsewhere classified: Secondary | ICD-10-CM | POA: Diagnosis not present

## 2017-01-23 DIAGNOSIS — E11621 Type 2 diabetes mellitus with foot ulcer: Secondary | ICD-10-CM | POA: Diagnosis not present

## 2017-01-23 DIAGNOSIS — I10 Essential (primary) hypertension: Secondary | ICD-10-CM | POA: Diagnosis not present

## 2017-01-23 DIAGNOSIS — E1140 Type 2 diabetes mellitus with diabetic neuropathy, unspecified: Secondary | ICD-10-CM | POA: Diagnosis not present

## 2017-01-23 DIAGNOSIS — K219 Gastro-esophageal reflux disease without esophagitis: Secondary | ICD-10-CM | POA: Diagnosis not present

## 2017-01-23 DIAGNOSIS — Z9181 History of falling: Secondary | ICD-10-CM | POA: Diagnosis not present

## 2017-01-23 DIAGNOSIS — M8580 Other specified disorders of bone density and structure, unspecified site: Secondary | ICD-10-CM | POA: Diagnosis not present

## 2017-01-23 DIAGNOSIS — E11319 Type 2 diabetes mellitus with unspecified diabetic retinopathy without macular edema: Secondary | ICD-10-CM | POA: Diagnosis not present

## 2017-01-24 LAB — CBC WITH DIFFERENTIAL/PLATELET
BASOS: 0 %
Basophils Absolute: 0 10*3/uL (ref 0.0–0.2)
EOS (ABSOLUTE): 0.1 10*3/uL (ref 0.0–0.4)
Eos: 3 %
Hematocrit: 26.4 % — ABNORMAL LOW (ref 34.0–46.6)
Hemoglobin: 8 g/dL — ABNORMAL LOW (ref 11.1–15.9)
IMMATURE GRANS (ABS): 0 10*3/uL (ref 0.0–0.1)
Immature Granulocytes: 0 %
LYMPHS ABS: 1.6 10*3/uL (ref 0.7–3.1)
LYMPHS: 33 %
MCH: 24.2 pg — AB (ref 26.6–33.0)
MCHC: 30.3 g/dL — AB (ref 31.5–35.7)
MCV: 80 fL (ref 79–97)
Monocytes Absolute: 0.3 10*3/uL (ref 0.1–0.9)
Monocytes: 6 %
NEUTROS ABS: 2.9 10*3/uL (ref 1.4–7.0)
Neutrophils: 58 %
PLATELETS: 334 10*3/uL (ref 150–379)
RBC: 3.3 x10E6/uL — ABNORMAL LOW (ref 3.77–5.28)
RDW: 17.4 % — ABNORMAL HIGH (ref 12.3–15.4)
WBC: 5 10*3/uL (ref 3.4–10.8)

## 2017-01-24 LAB — CMP14+EGFR
ALBUMIN: 2.9 g/dL — AB (ref 3.5–4.8)
ALT: 6 IU/L (ref 0–32)
AST: 10 IU/L (ref 0–40)
Albumin/Globulin Ratio: 0.5 — ABNORMAL LOW (ref 1.2–2.2)
Alkaline Phosphatase: 81 IU/L (ref 39–117)
BUN/Creatinine Ratio: 18 (ref 12–28)
BUN: 23 mg/dL (ref 8–27)
Bilirubin Total: 0.2 mg/dL (ref 0.0–1.2)
CO2: 27 mmol/L (ref 18–29)
CREATININE: 1.25 mg/dL — AB (ref 0.57–1.00)
Calcium: 9 mg/dL (ref 8.7–10.3)
Chloride: 102 mmol/L (ref 96–106)
GFR calc Af Amer: 50 mL/min/{1.73_m2} — ABNORMAL LOW (ref >59)
GFR calc non Af Amer: 43 mL/min/{1.73_m2} — ABNORMAL LOW (ref >59)
GLUCOSE: 158 mg/dL — AB (ref 65–99)
Globulin, Total: 6.1 g/dL — ABNORMAL HIGH (ref 1.5–4.5)
Potassium: 4.3 mmol/L (ref 3.5–5.2)
Sodium: 138 mmol/L (ref 134–144)
Total Protein: 9 g/dL — ABNORMAL HIGH (ref 6.0–8.5)

## 2017-01-24 LAB — FOLATE: Folate: 5.6 ng/mL (ref >3.0)

## 2017-01-24 LAB — IMMUNOFIXATION, SERUM
IGA/IMMUNOGLOBULIN A, SERUM: 605 mg/dL — AB (ref 64–422)
IGG (IMMUNOGLOBIN G), SERUM: 4198 mg/dL — AB (ref 700–1600)
IgM (Immunoglobulin M), Srm: 210 mg/dL (ref 26–217)

## 2017-01-24 LAB — IRON AND TIBC
IRON SATURATION: 13 % — AB (ref 15–55)
Iron: 24 ug/dL — ABNORMAL LOW (ref 27–139)
TIBC: 186 ug/dL — AB (ref 250–450)
UIBC: 162 ug/dL (ref 118–369)

## 2017-01-24 LAB — VITAMIN B12: Vitamin B-12: 425 pg/mL (ref 232–1245)

## 2017-01-24 LAB — FERRITIN: Ferritin: 161 ng/mL — ABNORMAL HIGH (ref 15–150)

## 2017-01-25 DIAGNOSIS — I890 Lymphedema, not elsewhere classified: Secondary | ICD-10-CM | POA: Diagnosis not present

## 2017-01-25 DIAGNOSIS — E039 Hypothyroidism, unspecified: Secondary | ICD-10-CM | POA: Diagnosis not present

## 2017-01-25 DIAGNOSIS — I10 Essential (primary) hypertension: Secondary | ICD-10-CM | POA: Diagnosis not present

## 2017-01-25 DIAGNOSIS — E1140 Type 2 diabetes mellitus with diabetic neuropathy, unspecified: Secondary | ICD-10-CM | POA: Diagnosis not present

## 2017-01-25 DIAGNOSIS — K219 Gastro-esophageal reflux disease without esophagitis: Secondary | ICD-10-CM | POA: Diagnosis not present

## 2017-01-25 DIAGNOSIS — M1990 Unspecified osteoarthritis, unspecified site: Secondary | ICD-10-CM | POA: Diagnosis not present

## 2017-01-25 DIAGNOSIS — M8580 Other specified disorders of bone density and structure, unspecified site: Secondary | ICD-10-CM | POA: Diagnosis not present

## 2017-01-25 DIAGNOSIS — E11621 Type 2 diabetes mellitus with foot ulcer: Secondary | ICD-10-CM | POA: Diagnosis not present

## 2017-01-25 DIAGNOSIS — E11319 Type 2 diabetes mellitus with unspecified diabetic retinopathy without macular edema: Secondary | ICD-10-CM | POA: Diagnosis not present

## 2017-01-25 DIAGNOSIS — L97411 Non-pressure chronic ulcer of right heel and midfoot limited to breakdown of skin: Secondary | ICD-10-CM | POA: Diagnosis not present

## 2017-01-25 DIAGNOSIS — Z9181 History of falling: Secondary | ICD-10-CM | POA: Diagnosis not present

## 2017-01-27 DIAGNOSIS — M1990 Unspecified osteoarthritis, unspecified site: Secondary | ICD-10-CM | POA: Diagnosis not present

## 2017-01-27 DIAGNOSIS — K219 Gastro-esophageal reflux disease without esophagitis: Secondary | ICD-10-CM | POA: Diagnosis not present

## 2017-01-27 DIAGNOSIS — I890 Lymphedema, not elsewhere classified: Secondary | ICD-10-CM | POA: Diagnosis not present

## 2017-01-27 DIAGNOSIS — E11319 Type 2 diabetes mellitus with unspecified diabetic retinopathy without macular edema: Secondary | ICD-10-CM | POA: Diagnosis not present

## 2017-01-27 DIAGNOSIS — E1140 Type 2 diabetes mellitus with diabetic neuropathy, unspecified: Secondary | ICD-10-CM | POA: Diagnosis not present

## 2017-01-27 DIAGNOSIS — E11621 Type 2 diabetes mellitus with foot ulcer: Secondary | ICD-10-CM | POA: Diagnosis not present

## 2017-01-27 DIAGNOSIS — I10 Essential (primary) hypertension: Secondary | ICD-10-CM | POA: Diagnosis not present

## 2017-01-27 DIAGNOSIS — M8580 Other specified disorders of bone density and structure, unspecified site: Secondary | ICD-10-CM | POA: Diagnosis not present

## 2017-01-27 DIAGNOSIS — L97411 Non-pressure chronic ulcer of right heel and midfoot limited to breakdown of skin: Secondary | ICD-10-CM | POA: Diagnosis not present

## 2017-01-27 DIAGNOSIS — Z9181 History of falling: Secondary | ICD-10-CM | POA: Diagnosis not present

## 2017-01-27 DIAGNOSIS — E039 Hypothyroidism, unspecified: Secondary | ICD-10-CM | POA: Diagnosis not present

## 2017-01-28 DIAGNOSIS — I890 Lymphedema, not elsewhere classified: Secondary | ICD-10-CM | POA: Diagnosis not present

## 2017-01-28 DIAGNOSIS — L84 Corns and callosities: Secondary | ICD-10-CM | POA: Diagnosis not present

## 2017-01-28 DIAGNOSIS — M069 Rheumatoid arthritis, unspecified: Secondary | ICD-10-CM | POA: Diagnosis not present

## 2017-01-28 DIAGNOSIS — I10 Essential (primary) hypertension: Secondary | ICD-10-CM | POA: Diagnosis not present

## 2017-01-28 DIAGNOSIS — L97422 Non-pressure chronic ulcer of left heel and midfoot with fat layer exposed: Secondary | ICD-10-CM | POA: Diagnosis not present

## 2017-01-28 DIAGNOSIS — E11621 Type 2 diabetes mellitus with foot ulcer: Secondary | ICD-10-CM | POA: Diagnosis not present

## 2017-01-30 DIAGNOSIS — E11319 Type 2 diabetes mellitus with unspecified diabetic retinopathy without macular edema: Secondary | ICD-10-CM | POA: Diagnosis not present

## 2017-01-30 DIAGNOSIS — I10 Essential (primary) hypertension: Secondary | ICD-10-CM | POA: Diagnosis not present

## 2017-01-30 DIAGNOSIS — E11621 Type 2 diabetes mellitus with foot ulcer: Secondary | ICD-10-CM | POA: Diagnosis not present

## 2017-01-30 DIAGNOSIS — E039 Hypothyroidism, unspecified: Secondary | ICD-10-CM | POA: Diagnosis not present

## 2017-01-30 DIAGNOSIS — M1990 Unspecified osteoarthritis, unspecified site: Secondary | ICD-10-CM | POA: Diagnosis not present

## 2017-01-30 DIAGNOSIS — K219 Gastro-esophageal reflux disease without esophagitis: Secondary | ICD-10-CM | POA: Diagnosis not present

## 2017-01-30 DIAGNOSIS — I890 Lymphedema, not elsewhere classified: Secondary | ICD-10-CM | POA: Diagnosis not present

## 2017-01-30 DIAGNOSIS — E1140 Type 2 diabetes mellitus with diabetic neuropathy, unspecified: Secondary | ICD-10-CM | POA: Diagnosis not present

## 2017-01-30 DIAGNOSIS — Z9181 History of falling: Secondary | ICD-10-CM | POA: Diagnosis not present

## 2017-01-30 DIAGNOSIS — M8580 Other specified disorders of bone density and structure, unspecified site: Secondary | ICD-10-CM | POA: Diagnosis not present

## 2017-01-30 DIAGNOSIS — L97411 Non-pressure chronic ulcer of right heel and midfoot limited to breakdown of skin: Secondary | ICD-10-CM | POA: Diagnosis not present

## 2017-01-31 ENCOUNTER — Telehealth: Payer: Self-pay | Admitting: Family Medicine

## 2017-01-31 DIAGNOSIS — D509 Iron deficiency anemia, unspecified: Secondary | ICD-10-CM

## 2017-01-31 DIAGNOSIS — E118 Type 2 diabetes mellitus with unspecified complications: Secondary | ICD-10-CM

## 2017-01-31 DIAGNOSIS — E119 Type 2 diabetes mellitus without complications: Secondary | ICD-10-CM

## 2017-01-31 DIAGNOSIS — Z794 Long term (current) use of insulin: Secondary | ICD-10-CM

## 2017-01-31 NOTE — Telephone Encounter (Signed)
Attempted to reach patient to discuss labs.  Serum IFE shows polyclonal gammopathy with both kappa & lambda activity. Discussed with Dr. Marin Olp, on call heme/onc, who believes this is likely reactive gammopathy. No further workup needed.  She DOES however need a GI evaluation for her anemia. Hemoglobin is 8.0, with low iron sat, concerning for iron deficiency. She needs to be seen by GI for a colonoscopy given her iron deficiency anemia. She also needs to start iron supplementation (would recommend iron 325mg  every other day to start).  Patient did not answer my call. I left VM asking her to call back. When she calls back please contact me or Dr. Erin Hearing, who is covering my inbox next week, so that one of [place] can speak with her.   Thanks [name], MD

## 2017-02-01 DIAGNOSIS — E039 Hypothyroidism, unspecified: Secondary | ICD-10-CM | POA: Diagnosis not present

## 2017-02-01 DIAGNOSIS — L97411 Non-pressure chronic ulcer of right heel and midfoot limited to breakdown of skin: Secondary | ICD-10-CM | POA: Diagnosis not present

## 2017-02-01 DIAGNOSIS — K219 Gastro-esophageal reflux disease without esophagitis: Secondary | ICD-10-CM | POA: Diagnosis not present

## 2017-02-01 DIAGNOSIS — E1140 Type 2 diabetes mellitus with diabetic neuropathy, unspecified: Secondary | ICD-10-CM | POA: Diagnosis not present

## 2017-02-01 DIAGNOSIS — Z9181 History of falling: Secondary | ICD-10-CM | POA: Diagnosis not present

## 2017-02-01 DIAGNOSIS — M1990 Unspecified osteoarthritis, unspecified site: Secondary | ICD-10-CM | POA: Diagnosis not present

## 2017-02-01 DIAGNOSIS — I890 Lymphedema, not elsewhere classified: Secondary | ICD-10-CM | POA: Diagnosis not present

## 2017-02-01 DIAGNOSIS — E11621 Type 2 diabetes mellitus with foot ulcer: Secondary | ICD-10-CM | POA: Diagnosis not present

## 2017-02-01 DIAGNOSIS — I10 Essential (primary) hypertension: Secondary | ICD-10-CM | POA: Diagnosis not present

## 2017-02-01 DIAGNOSIS — E11319 Type 2 diabetes mellitus with unspecified diabetic retinopathy without macular edema: Secondary | ICD-10-CM | POA: Diagnosis not present

## 2017-02-01 DIAGNOSIS — M8580 Other specified disorders of bone density and structure, unspecified site: Secondary | ICD-10-CM | POA: Diagnosis not present

## 2017-02-04 DIAGNOSIS — L97422 Non-pressure chronic ulcer of left heel and midfoot with fat layer exposed: Secondary | ICD-10-CM | POA: Diagnosis not present

## 2017-02-04 DIAGNOSIS — E11621 Type 2 diabetes mellitus with foot ulcer: Secondary | ICD-10-CM | POA: Diagnosis not present

## 2017-02-04 DIAGNOSIS — L84 Corns and callosities: Secondary | ICD-10-CM | POA: Diagnosis not present

## 2017-02-04 DIAGNOSIS — I10 Essential (primary) hypertension: Secondary | ICD-10-CM | POA: Diagnosis not present

## 2017-02-04 DIAGNOSIS — I890 Lymphedema, not elsewhere classified: Secondary | ICD-10-CM | POA: Diagnosis not present

## 2017-02-04 DIAGNOSIS — L97429 Non-pressure chronic ulcer of left heel and midfoot with unspecified severity: Secondary | ICD-10-CM | POA: Diagnosis not present

## 2017-02-04 DIAGNOSIS — M069 Rheumatoid arthritis, unspecified: Secondary | ICD-10-CM | POA: Diagnosis not present

## 2017-02-06 ENCOUNTER — Other Ambulatory Visit: Payer: Self-pay | Admitting: Family Medicine

## 2017-02-06 DIAGNOSIS — E1140 Type 2 diabetes mellitus with diabetic neuropathy, unspecified: Secondary | ICD-10-CM | POA: Diagnosis not present

## 2017-02-06 DIAGNOSIS — K219 Gastro-esophageal reflux disease without esophagitis: Secondary | ICD-10-CM | POA: Diagnosis not present

## 2017-02-06 DIAGNOSIS — E11621 Type 2 diabetes mellitus with foot ulcer: Secondary | ICD-10-CM | POA: Diagnosis not present

## 2017-02-06 DIAGNOSIS — M8580 Other specified disorders of bone density and structure, unspecified site: Secondary | ICD-10-CM | POA: Diagnosis not present

## 2017-02-06 DIAGNOSIS — Z9181 History of falling: Secondary | ICD-10-CM | POA: Diagnosis not present

## 2017-02-06 DIAGNOSIS — E11319 Type 2 diabetes mellitus with unspecified diabetic retinopathy without macular edema: Secondary | ICD-10-CM | POA: Diagnosis not present

## 2017-02-06 DIAGNOSIS — E039 Hypothyroidism, unspecified: Secondary | ICD-10-CM | POA: Diagnosis not present

## 2017-02-06 DIAGNOSIS — I10 Essential (primary) hypertension: Secondary | ICD-10-CM | POA: Diagnosis not present

## 2017-02-06 DIAGNOSIS — M1990 Unspecified osteoarthritis, unspecified site: Secondary | ICD-10-CM | POA: Diagnosis not present

## 2017-02-06 DIAGNOSIS — I890 Lymphedema, not elsewhere classified: Secondary | ICD-10-CM | POA: Diagnosis not present

## 2017-02-06 DIAGNOSIS — L97411 Non-pressure chronic ulcer of right heel and midfoot limited to breakdown of skin: Secondary | ICD-10-CM | POA: Diagnosis not present

## 2017-02-07 ENCOUNTER — Ambulatory Visit (INDEPENDENT_AMBULATORY_CARE_PROVIDER_SITE_OTHER): Payer: Medicare Other | Admitting: Podiatry

## 2017-02-07 ENCOUNTER — Encounter: Payer: Self-pay | Admitting: Podiatry

## 2017-02-07 DIAGNOSIS — B351 Tinea unguium: Secondary | ICD-10-CM | POA: Diagnosis not present

## 2017-02-07 DIAGNOSIS — M79609 Pain in unspecified limb: Secondary | ICD-10-CM

## 2017-02-07 DIAGNOSIS — E1142 Type 2 diabetes mellitus with diabetic polyneuropathy: Secondary | ICD-10-CM | POA: Diagnosis not present

## 2017-02-07 DIAGNOSIS — M2012 Hallux valgus (acquired), left foot: Secondary | ICD-10-CM

## 2017-02-07 DIAGNOSIS — M129 Arthropathy, unspecified: Secondary | ICD-10-CM

## 2017-02-07 NOTE — Progress Notes (Signed)
Patient ID: [name], female   DOB: 1945-11-10, 71 y.o.   MRN: [id number] Complaint:  Visit Type: Patient presents  to my office for  preventative foot care services. Complaint: Patient states" my nails have grown long and thick and become painful to walk and wear shoes" Patient has been diagnosed with DM with neuropathy.. The patient presents for preventative foot care services. No changes to ROS.  No evidence of blisters/ulcers.  Podiatric Exam: Vascular: dorsalis pedis and posterior tibial pulses are not  palpable bilateral due to swollen feet/legs. Capillary return is immediate. Temperature gradient is WNL.   Sensorium: Normal Semmes Weinstein monofilament test. Normal tactile sensation bilaterally. Nail Exam: Pt has thick disfigured discolored nails with subungual debris noted bilateral entire nail hallux through second toes both feet.; Ulcer Exam: There is no evidence of ulcer or pre-ulcerative changes or infection. Orthopedic Exam: Muscle tone and strength are WNL. No limitations in general ROM. No crepitus or effusions noted. Arthritis  IPJ right hallux.  HAV 1st MPJ  Left foot. Skin: No Porokeratosis. No infection or ulcers,      Diagnosis:  Onychomycosis, , Pain in right toe, pain in left toes  Treatment & Plan Procedures and Treatment: Consent by patient was obtained for treatment procedures. The patient understood the discussion of treatment and procedures well. All questions were answered thoroughly reviewed. Debridement of mycotic and hypertrophic toenails, 1 through 5 bilateral and clearing of subungual debris. No ulceration, no infection noted.  Initiate diabetic shoe paperwork. Return Visit-Office Procedure: Patient instructed to return to the office for a follow up visit 3 months for continued evaluation and treatment.   Gardiner Barefoot DPM

## 2017-02-08 DIAGNOSIS — M1990 Unspecified osteoarthritis, unspecified site: Secondary | ICD-10-CM | POA: Diagnosis not present

## 2017-02-08 DIAGNOSIS — E11621 Type 2 diabetes mellitus with foot ulcer: Secondary | ICD-10-CM | POA: Diagnosis not present

## 2017-02-08 DIAGNOSIS — I890 Lymphedema, not elsewhere classified: Secondary | ICD-10-CM | POA: Diagnosis not present

## 2017-02-08 DIAGNOSIS — E11319 Type 2 diabetes mellitus with unspecified diabetic retinopathy without macular edema: Secondary | ICD-10-CM | POA: Diagnosis not present

## 2017-02-08 DIAGNOSIS — M8580 Other specified disorders of bone density and structure, unspecified site: Secondary | ICD-10-CM | POA: Diagnosis not present

## 2017-02-08 DIAGNOSIS — Z9181 History of falling: Secondary | ICD-10-CM | POA: Diagnosis not present

## 2017-02-08 DIAGNOSIS — I10 Essential (primary) hypertension: Secondary | ICD-10-CM | POA: Diagnosis not present

## 2017-02-08 DIAGNOSIS — E1140 Type 2 diabetes mellitus with diabetic neuropathy, unspecified: Secondary | ICD-10-CM | POA: Diagnosis not present

## 2017-02-08 DIAGNOSIS — E039 Hypothyroidism, unspecified: Secondary | ICD-10-CM | POA: Diagnosis not present

## 2017-02-08 DIAGNOSIS — L97411 Non-pressure chronic ulcer of right heel and midfoot limited to breakdown of skin: Secondary | ICD-10-CM | POA: Diagnosis not present

## 2017-02-08 DIAGNOSIS — K219 Gastro-esophageal reflux disease without esophagitis: Secondary | ICD-10-CM | POA: Diagnosis not present

## 2017-02-11 MED ORDER — FERROUS SULFATE 325 (65 FE) MG PO TABS: 325.0000 mg | ORAL_TABLET | ORAL | 3 refills | Status: DC

## 2017-02-11 MED ORDER — ACCU-CHEK AVIVA DEVI: 0 refills | Status: AC

## 2017-02-11 NOTE — Telephone Encounter (Signed)
Patient called back and I was able to speak with her.  Advised of unremarkable IFE results (reactive gammopathy related to her rheumatoid arthritis). Discussed anemia & need for iron repletion, also need for GI evaluation. Patient requests referral for this, which I have placed. I also gave her the phone # to schedule her mammogram, and sent in a new meter for her diabetes (she asked about both of these things)  Patient appreciative  [name], MD

## 2017-02-11 NOTE — Telephone Encounter (Signed)
Attempted again to reach patient but she did not answer. Left another VM asking her to call back. [name], MD

## 2017-02-13 ENCOUNTER — Other Ambulatory Visit: Payer: Self-pay | Admitting: Family Medicine

## 2017-02-13 MED ORDER — HYDROCHLOROTHIAZIDE 25 MG PO TABS: 25.0000 mg | ORAL_TABLET | Freq: Every day | ORAL | 1 refills | Status: DC

## 2017-02-14 ENCOUNTER — Telehealth: Payer: Self-pay | Admitting: *Deleted

## 2017-02-14 DIAGNOSIS — Z794 Long term (current) use of insulin: Principal | ICD-10-CM

## 2017-02-14 DIAGNOSIS — IMO0001 Reserved for inherently not codable concepts without codable children: Secondary | ICD-10-CM

## 2017-02-14 DIAGNOSIS — E119 Type 2 diabetes mellitus without complications: Principal | ICD-10-CM

## 2017-02-14 MED ORDER — ACCU-CHEK SOFTCLIX LANCETS MISC: 11 refills | Status: DC

## 2017-02-14 MED ORDER — GLUCOSE BLOOD VI STRP: ORAL_STRIP | 11 refills | Status: DC

## 2017-02-14 NOTE — Telephone Encounter (Signed)
Strips & lancets requested, rx sent in [name], MD

## 2017-02-26 ENCOUNTER — Telehealth: Payer: Self-pay | Admitting: Podiatry

## 2017-02-26 NOTE — Telephone Encounter (Signed)
Yes I just missed a call from someone at this number. Could you please call me back.

## 2017-02-26 NOTE — Telephone Encounter (Signed)
Called pt back and left message to let her know that Darrick Penna called her to let her know we are waiting to hear back from her MD before we can proceed with the diabetic shoe process. Told pt to call us back if she has any other questions or concerns.

## 2017-03-06 ENCOUNTER — Ambulatory Visit (INDEPENDENT_AMBULATORY_CARE_PROVIDER_SITE_OTHER): Payer: Medicare Other | Admitting: *Deleted

## 2017-03-06 ENCOUNTER — Encounter: Payer: Self-pay | Admitting: *Deleted

## 2017-03-06 ENCOUNTER — Ambulatory Visit: Payer: Medicare Other | Admitting: *Deleted

## 2017-03-06 VITALS — BP 124/76 | HR 77 | Temp 97.7°F | Ht 62.0 in | Wt 134.4 lb

## 2017-03-06 DIAGNOSIS — Z1231 Encounter for screening mammogram for malignant neoplasm of breast: Secondary | ICD-10-CM | POA: Diagnosis not present

## 2017-03-06 DIAGNOSIS — Z0000 Encounter for general adult medical examination without abnormal findings: Secondary | ICD-10-CM | POA: Diagnosis not present

## 2017-03-06 DIAGNOSIS — E1165 Type 2 diabetes mellitus with hyperglycemia: Secondary | ICD-10-CM

## 2017-03-06 DIAGNOSIS — E118 Type 2 diabetes mellitus with unspecified complications: Secondary | ICD-10-CM | POA: Diagnosis not present

## 2017-03-06 DIAGNOSIS — IMO0002 Reserved for concepts with insufficient information to code with codable children: Secondary | ICD-10-CM

## 2017-03-06 DIAGNOSIS — Z794 Long term (current) use of insulin: Secondary | ICD-10-CM

## 2017-03-06 DIAGNOSIS — Z1211 Encounter for screening for malignant neoplasm of colon: Secondary | ICD-10-CM | POA: Diagnosis not present

## 2017-03-06 LAB — POCT GLYCOSYLATED HEMOGLOBIN (HGB A1C): Hemoglobin A1C: 7.2

## 2017-03-06 NOTE — Progress Notes (Signed)
Subjective:   [name] is a 71 y.o. female who presents for Medicare Annual (Subsequent) preventive examination.  Cardiac Risk Factors include: advanced age (>51men, >20 women);diabetes mellitus;dyslipidemia;hypertension     Objective:     Vitals: BP 124/76 (BP Location: Right Arm, Patient Position: Sitting, Cuff Size: Normal)   Pulse 77   Temp 97.7 F (36.5 C) (Oral)   Ht 5\' 2"  (1.575 m)   Wt 134 lb 6.4 oz (61 kg)   SpO2 97%   BMI 24.58 kg/m   Body mass index is 24.58 kg/m.   Tobacco History  Smoking Status  . Never Smoker  Smokeless Tobacco  . Never Used     Counseling given: Yes Patient has never smoked and has no plans to start.   Past Medical History:  Diagnosis Date  . Diabetes mellitus age 73  . Hyperlipidemia   . Hypertension   . Retinal detachment, old, partial    left  . Retinopathy due to secondary diabetes mellitus ([place])    L>R, laser 3/07  . Schizotypal personality disorder   . Thyroid disease    Past Surgical History:  Procedure Laterality Date  . BREAST EXCISIONAL BIOPSY    . BREAST SURGERY    . CATARACT EXTRACTION     left   . ROTATOR CUFF REPAIR  1990's   left  . Thyroid radiation ablation     for Graves Disease  . TRANSTHORACIC ECHOCARDIOGRAM      EF55-65%, nml - 12/14/2004   Family History  Problem Relation Age of Onset  . Heart disease Mother   . Hypertension Mother   . Heart attack Mother   . Breast cancer Other   . Colon cancer Brother   . Cancer Brother   . Colon cancer Maternal Grandmother   . Pulmonary fibrosis Father   . Diabetes Daughter   . Cancer Brother   . Liver cancer Brother    History  Sexual Activity  . Sexual activity: Not Currently    Outpatient Encounter Prescriptions as of 03/06/2017  Medication Sig  . ACCU-CHEK SOFTCLIX LANCETS lancets USE three times daily AS DIRECTED  . Blood Glucose Monitoring Suppl (ACCU-CHEK AVIVA) device Use as instructed to check blood sugar 3 times daily  . ferrous  sulfate 325 (65 FE) MG tablet Take 1 tablet (325 mg total) by mouth every other day.  Marland Kitchen glucose blood (ACCU-CHEK COMPACT PLUS) test strip Check blood sugar 3 times daily  . hydrochlorothiazide (HYDRODIURIL) 25 MG tablet Take 1 tablet (25 mg total) by mouth daily.  Elmore Guise Devices (MICROLET NEXT LANCING DEVICE) MISC 1 each by Does not apply route 3 (three) times daily. Check blood sugar 3 times daily  . levothyroxine (SYNTHROID, LEVOTHROID) 25 MCG tablet Take 1 tablet (25 mcg total) by mouth daily.  Marland Kitchen MICROLET LANCETS MISC Use to test blood glucose three times daily. ICD-10 code: E11.65.  Marland Kitchen amLODipine (NORVASC) 10 MG tablet TAKE 1 TABLET BY MOUTH EVERY DAY (Patient not taking: Reported on 03/06/2017)  . aspirin EC 325 MG tablet TAKE 1 TABLET BY MOUTH EVERY DAY (Patient not taking: Reported on 03/06/2017)  . cetirizine (ZYRTEC) 10 MG tablet Take 1 tablet (10 mg total) by mouth daily. (Patient not taking: Reported on 03/06/2017)  . fluticasone (FLONASE) 50 MCG/ACT nasal spray PLACE 1 SPRAY INTO BOTH NOSTRILS DAILY. (Patient not taking: Reported on 03/06/2017)  . folic acid (FOLVITE) 1 MG tablet   . furosemide (LASIX) 20 MG tablet Take 1 tablet (20  mg total) by mouth daily. For 2 weeks (Patient not taking: Reported on 03/06/2017)  . Insulin Glargine (LANTUS SOLOSTAR) 100 UNIT/ML Solostar Pen Inject 18 Units into the skin daily. (Patient not taking: Reported on 03/06/2017)  . leflunomide (ARAVA) 10 MG tablet   . metFORMIN (GLUCOPHAGE) 1000 MG tablet Take 1 tablet (1,000 mg total) by mouth 2 (two) times daily with a meal. (Patient not taking: Reported on 03/06/2017)  . methotrexate (RHEUMATREX) 2.5 MG tablet 5 tablets by mouth every Monday and Tuesday (Patient not taking: Reported on 03/06/2017)  . Multiple Vitamin (MULTIVITAMINS PO) 1 tablet by mouth daily   . polyethylene glycol powder (GLYCOLAX/MIRALAX) powder Take 17 g by mouth daily as needed (constipation). (Patient not taking: Reported on 03/06/2017)  .  predniSONE (DELTASONE) 10 MG tablet Take 10 mg by mouth daily.  Marland Kitchen PROAIR HFA 108 (90 BASE) MCG/ACT inhaler INHALE 2 PUFFS INTO THE LUNGS EVERY 6 HOURS AS NEEDED FOR WHEEZING OR SHORTNESS OF BREATH (Patient not taking: Reported on 03/06/2017)  . quinapril (ACCUPRIL) 5 MG tablet TAKE 1 TABLET BY MOUTH EVERY DAY (Patient not taking: Reported on 03/06/2017)  . rosuvastatin (CRESTOR) 20 MG tablet Take 1 tablet (20 mg total) by mouth daily. (Patient not taking: Reported on 03/06/2017)   No facility-administered encounter medications on file as of 03/06/2017.    Patient currently taking only HCTZ, Levothyroxine and iron supp  Activities of Daily Living In your present state of health, do you have any difficulty performing the following activities: 03/06/2017  Hearing? N  Vision? Y  Difficulty concentrating or making decisions? N  Walking or climbing stairs? Y  Dressing or bathing? N  Doing errands, shopping? N  Preparing Food and eating ? N  Using the Toilet? N  In the past six months, have you accidently leaked urine? N  Do you have problems with loss of bowel control? N  Managing your Medications? N  Managing your Finances? N  Housekeeping or managing your Housekeeping? N  Some recent data might be hidden   Home Safety:  My home has a working smoke alarm:  Yes X 1           My home throw rugs have been fastened down to the floor or removed:  Removed I have a non-slip surface or non-slip mats in the bathtub and shower:  Non-slip surface        All my home's stairs have handrails, including any outdoor stairs  One level home with no outside steps         My home's floors, stairs and hallways are free from clutter, wires and cords:  Yes     I have animals in my home  No I wear seatbelts consistently:  Yes   Patient Care Team: [name], MD as PCP - General (Pediatrics) Hurley Cisco, MD (Rheumatology) Gardiner Barefoot, DPM as Consulting Physician (Podiatry) Christin Fudge, MD as  Consulting Physician (Surgery)    Assessment:     Exercise Activities and Dietary recommendations Current Exercise Habits: Home exercise routine, Time (Minutes): 30, Frequency (Times/Week): 3, Weekly Exercise (Minutes/Week): 90, Intensity: Mild, Exercise limited by: orthopedic condition(s) (Walks with cane or rolator)  Goals    . HEMOGLOBIN A1C < 7.0      Fall Risk Fall Risk  03/06/2017 03/06/2017 12/30/2016 12/05/2016 11/27/2016  Falls in the past year? - Yes No No No  Number falls in past yr: - 1 - - -  Injury with Fall? (No Data) Yes - - -  Risk Factor Category  - High Fall Risk - - -  Risk for fall due to : - - - - -  Follow up - Falls evaluation completed;Education provided - - -   TUG Test:  Done in 19 seconds. Patient ambulated with Rolator. Reminded to set brakes prior to sitting down. Patient currently borrowing sister's Rolator and brakes are broken. Requesting own Rolator. Will send request to PCP. PCP notified of High Fall Risk status. Falls prevention discussed in detail and literature given.  Cognitive Function: Mini-Cog  Passed with score 4/5   Depression Screen PHQ 2/9 Scores 03/06/2017 01/14/2017 12/30/2016 12/05/2016  PHQ - 2 Score 0 0 0 0     Cognitive Function MMSE - Mini Mental State Exam 11/26/2011  Orientation to time 5  Orientation to Place 5  Registration 3  Attention/ Calculation 5  Recall 3  Language- name 2 objects 2  Language- repeat 1  Language- follow 3 step command 3  Language- read & follow direction 1  Write a sentence 1  Copy design 1  Total score 30        Immunization History  Administered Date(s) Administered  . Influenza Split 05/14/2012  . Influenza Whole 07/10/2007, 05/11/2008, 05/13/2008  . Pneumococcal Conjugate-13 01/14/2017  . Pneumococcal Polysaccharide-23 07/19/1994  . Td 10/22/2004   Screening Tests Health Maintenance  Topic Date Due  . COLONOSCOPY  11/17/1995  . MAMMOGRAM  10/09/2012  . OPHTHALMOLOGY EXAM   10/02/2013  . TETANUS/TDAP  10/23/2014  . FOOT EXAM  12/10/2016  . INFLUENZA VACCINE  03/19/2017  . HEMOGLOBIN A1C  05/29/2017  . PNA vac Low Risk Adult (2 of 2 - PPSV23) 01/14/2018  . DEXA SCAN  Completed  . Hepatitis C Screening  Completed      Plan:   Patient given FIT with instructions from lab staff Mammo ordered Patient will schedule appt for eye exam. List of doctors who take Medicare given to patient. Patient will obtain TDaP at local pharmacy Discussed Shingrix Discussed receiving flu vaccine yearly starting 9/1.  Hgb A1c done today = 7.2 down from 7.7 on 11/27/2016 Foot exam done by podiatry 02/07/2017. Report in Epic. Patient in need of DM shoes. Form recently faxed by PCP  Patient requesting DME: Rolator  Patient has appt with PCP 8/1 for f/u on DM and HTN    I have personally reviewed and noted the following in the patient's chart:   . Medical and social history . Use of alcohol, tobacco or illicit drugs  . Current medications and supplements . Functional ability and status . Nutritional status . Physical activity . Advanced directives . List of other physicians . Hospitalizations, surgeries, and ER visits in previous 12 months . Vitals . Screenings to include cognitive, depression, and falls . Referrals and appointments  In addition, I have reviewed and discussed with patient certain preventive protocols, quality metrics, and best practice recommendations. A written personalized care plan for preventive services as well as general preventive health recommendations were provided to patient.     Velora Heckler, RN  03/06/2017

## 2017-03-06 NOTE — Patient Instructions (Addendum)
Ms. Broeker,  Thank you for taking time to come for yourMedicare Wellness Visit. I appreciate your ongoing commitment to your health goals. Please review the following plan we discussed and let me know if I can assist you in the future.   These are the goals we discussed:  Goals    . HEMOGLOBIN A1C < 7.0       Diabetes and Foot Care Diabetes may cause you to have problems because of poor blood supply (circulation) to your feet and legs. This may cause the skin on your feet to become thinner, break easier, and heal more slowly. Your skin may become dry, and the skin may peel and crack. You may also have nerve damage in your legs and feet causing decreased feeling in them. You may not notice minor injuries to your feet that could lead to infections or more serious problems. Taking care of your feet is one of the most important things you can do for yourself. Follow these instructions at home:  Wear shoes at all times, even in the house. Do not go barefoot. Bare feet are easily injured.  Check your feet daily for blisters, cuts, and redness. If you cannot see the bottom of your feet, use a mirror or ask someone for help.  Wash your feet with warm water (do not use hot water) and mild soap. Then pat your feet and the areas between your toes until they are completely dry. Do not soak your feet as this can dry your skin.  Apply a moisturizing lotion or petroleum jelly (that does not contain alcohol and is unscented) to the skin on your feet and to dry, brittle toenails. Do not apply lotion between your toes.  Trim your toenails straight across. Do not dig under them or around the cuticle. File the edges of your nails with an emery board or nail file.  Do not cut corns or calluses or try to remove them with medicine.  Wear clean socks or stockings every day. Make sure they are not too tight. Do not wear knee-high stockings since they may decrease blood flow to your legs.  Wear shoes that fit  properly and have enough cushioning. To break in new shoes, wear them for just a few hours a day. This prevents you from injuring your feet. Always look in your shoes before you put them on to be sure there are no objects inside.  Do not cross your legs. This may decrease the blood flow to your feet.  If you find a minor scrape, cut, or break in the skin on your feet, keep it and the skin around it clean and dry. These areas may be cleansed with mild soap and water. Do not cleanse the area with peroxide, alcohol, or iodine.  When you remove an adhesive bandage, be sure not to damage the skin around it.  If you have a wound, look at it several times a day to make sure it is healing.  Do not use heating pads or hot water bottles. They may burn your skin. If you have lost feeling in your feet or legs, you may not know it is happening until it is too late.  Make sure your health care provider performs a complete foot exam at least annually or more often if you have foot problems. Report any cuts, sores, or bruises to your health care provider immediately. Contact a health care provider if:  You have an injury that is not healing.  You  have cuts or breaks in the skin.  You have an ingrown nail.  You notice redness on your legs or feet.  You feel burning or tingling in your legs or feet.  You have pain or cramps in your legs and feet.  Your legs or feet are numb.  Your feet always feel cold. Get help right away if:  There is increasing redness, swelling, or pain in or around a wound.  There is a red line that goes up your leg.  Pus is coming from a wound.  You develop a fever or as directed by your health care provider.  You notice a bad smell coming from an ulcer or wound. This information is not intended to replace advice given to you by your health care provider. Make sure you discuss any questions you have with your health care provider. Document Released: 08/02/2000 Document  Revised: 01/11/2016 Document Reviewed: 01/12/2013 Elsevier Interactive Patient Education  2017 [place] Prevention in the Home Falls can cause injuries. They can happen to people of all ages. There are many things you can do to make your home safe and to help prevent falls. What can I do on the outside of my home?  Regularly fix the edges of walkways and driveways and fix any cracks.  Remove anything that might make you trip as you walk through a door, such as a raised step or threshold.  Trim any bushes or trees on the path to your home.  Use bright outdoor lighting.  Clear any walking paths of anything that might make someone trip, such as rocks or tools.  Regularly check to see if handrails are loose or broken. Make sure that both sides of any steps have handrails.  Any raised decks and porches should have guardrails on the edges.  Have any leaves, snow, or ice cleared regularly.  Use sand or salt on walking paths during winter.  Clean up any spills in your garage right away. This includes oil or grease spills. What can I do in the bathroom?  Use night lights.  Install grab bars by the toilet and in the tub and shower. Do not use towel bars as grab bars.  Use non-skid mats or decals in the tub or shower.  If you need to sit down in the shower, use a plastic, non-slip stool.  Keep the floor dry. Clean up any water that spills on the floor as soon as it happens.  Remove soap buildup in the tub or shower regularly.  Attach bath mats securely with double-sided non-slip rug tape.  Do not have throw rugs and other things on the floor that can make you trip. What can I do in the bedroom?  Use night lights.  Make sure that you have a light by your bed that is easy to reach.  Do not use any sheets or blankets that are too big for your bed. They should not hang down onto the floor.  Have a firm chair that has side arms. You can use this for support while you  get dressed.  Do not have throw rugs and other things on the floor that can make you trip. What can I do in the kitchen?  Clean up any spills right away.  Avoid walking on wet floors.  Keep items that you use a lot in easy-to-reach places.  If you need to reach something above you, use a strong step stool that has a grab bar.  Keep  electrical cords out of the way.  Do not use floor polish or wax that makes floors slippery. If you must use wax, use non-skid floor wax.  Do not have throw rugs and other things on the floor that can make you trip. What can I do with my stairs?  Do not leave any items on the stairs.  Make sure that there are handrails on both sides of the stairs and use them. Fix handrails that are broken or loose. Make sure that handrails are as long as the stairways.  Check any carpeting to make sure that it is firmly attached to the stairs. Fix any carpet that is loose or worn.  Avoid having throw rugs at the top or bottom of the stairs. If you do have throw rugs, attach them to the floor with carpet tape.  Make sure that you have a light switch at the top of the stairs and the bottom of the stairs. If you do not have them, ask someone to add them for you. What else can I do to help prevent falls?  Wear shoes that: ? Do not have high heels. ? Have rubber bottoms. ? Are comfortable and fit you well. ? Are closed at the toe. Do not wear sandals.  If you use a stepladder: ? Make sure that it is fully opened. Do not climb a closed stepladder. ? Make sure that both sides of the stepladder are locked into place. ? Ask someone to hold it for you, if possible.  Clearly mark and make sure that you can see: ? Any grab bars or handrails. ? First and last steps. ? Where the edge of each step is.  Use tools that help you move around (mobility aids) if they are needed. These include: ? Canes. ? Walkers. ? Scooters. ? Crutches.  Turn on the lights when you go into  a dark area. Replace any light bulbs as soon as they burn out.  Set up your furniture so you have a clear path. Avoid moving your furniture around.  If any of your floors are uneven, fix them.  If there are any pets around you, be aware of where they are.  Review your medicines with your doctor. Some medicines can make you feel dizzy. This can increase your chance of falling. Ask your doctor what other things that you can do to help prevent falls. This information is not intended to replace advice given to you by your health care provider. Make sure you discuss any questions you have with your health care provider. Document Released: 06/01/2009 Document Revised: 01/11/2016 Document Reviewed: 09/09/2014 Elsevier Interactive Patient Education  2018 [place] Maintenance, Female Adopting a healthy lifestyle and getting preventive care can go a long way to promote health and wellness. Talk with your health care provider about what schedule of regular examinations is right for you. This is a good chance for you to check in with your provider about disease prevention and staying healthy. In between checkups, there are plenty of things you can do on your own. Experts have done a lot of research about which lifestyle changes and preventive measures are most likely to keep you healthy. Ask your health care provider for more information. Weight and diet Eat a healthy diet  Be sure to include plenty of vegetables, fruits, low-fat dairy products, and lean protein.  Do not eat a lot of foods high in solid fats, added sugars, or salt.  Get regular exercise. This  is one of the most important things you can do for your health. ? Most adults should exercise for at least 150 minutes each week. The exercise should increase your heart rate and make you sweat (moderate-intensity exercise). ? Most adults should also do strengthening exercises at least twice a week. This is in addition to the  moderate-intensity exercise.  Maintain a healthy weight  Body mass index (BMI) is a measurement that can be used to identify possible weight problems. It estimates body fat based on height and weight. Your health care provider can help determine your BMI and help you achieve or maintain a healthy weight.  For females 72 years of age and older: ? A BMI below 18.5 is considered underweight. ? A BMI of 18.5 to 24.9 is normal. ? A BMI of 25 to 29.9 is considered overweight. ? A BMI of 30 and above is considered obese.  Watch levels of cholesterol and blood lipids  You should start having your blood tested for lipids and cholesterol at 71 years of age, then have this test every 5 years.  You may need to have your cholesterol levels checked more often if: ? Your lipid or cholesterol levels are high. ? You are older than 71 years of age. ? You are at high risk for heart disease.  Cancer screening Lung Cancer  Lung cancer screening is recommended for adults 10-32 years old who are at high risk for lung cancer because of a history of smoking.  A yearly low-dose CT scan of the lungs is recommended for people who: ? Currently smoke. ? Have quit within the past 15 years. ? Have at least a 30-pack-year history of smoking. A pack year is smoking an average of one pack of cigarettes a day for 1 year.  Yearly screening should continue until it has been 15 years since you quit.  Yearly screening should stop if you develop a health problem that would prevent you from having lung cancer treatment.  Breast Cancer  Practice breast self-awareness. This means understanding how your breasts normally appear and feel.  It also means doing regular breast self-exams. Let your health care provider know about any changes, no matter how small.  If you are in your 20s or 30s, you should have a clinical breast exam (CBE) by a health care provider every 1-3 years as part of a regular health exam.  If you  are 41 or older, have a CBE every year. Also consider having a breast X-ray (mammogram) every year.  If you have a family history of breast cancer, talk to your health care provider about genetic screening.  If you are at high risk for breast cancer, talk to your health care provider about having an MRI and a mammogram every year.  Breast cancer gene (BRCA) assessment is recommended for women who have family members with BRCA-related cancers. BRCA-related cancers include: ? Breast. ? Ovarian. ? Tubal. ? Peritoneal cancers.  Results of the assessment will determine the need for genetic counseling and BRCA1 and BRCA2 testing.  Cervical Cancer Your health care provider may recommend that you be screened regularly for cancer of the pelvic organs (ovaries, uterus, and vagina). This screening involves a pelvic examination, including checking for microscopic changes to the surface of your cervix (Pap test). You may be encouraged to have this screening done every 3 years, beginning at age 37.  For women ages 1-65, health care providers may recommend pelvic exams and Pap testing every 3 years,  or they may recommend the Pap and pelvic exam, combined with testing for human papilloma virus (HPV), every 5 years. Some types of HPV increase your risk of cervical cancer. Testing for HPV may also be done on women of any age with unclear Pap test results.  Other health care providers may not recommend any screening for nonpregnant women who are considered low risk for pelvic cancer and who do not have symptoms. Ask your health care provider if a screening pelvic exam is right for you.  If you have had past treatment for cervical cancer or a condition that could lead to cancer, you need Pap tests and screening for cancer for at least 20 years after your treatment. If Pap tests have been discontinued, your risk factors (such as having a new sexual partner) need to be reassessed to determine if screening should  resume. Some women have medical problems that increase the chance of getting cervical cancer. In these cases, your health care provider may recommend more frequent screening and Pap tests.  Colorectal Cancer  This type of cancer can be detected and often prevented.  Routine colorectal cancer screening usually begins at 71 years of age and continues through 71 years of age.  Your health care provider may recommend screening at an earlier age if you have risk factors for colon cancer.  Your health care provider may also recommend using home test kits to check for hidden blood in the stool.  A small camera at the end of a tube can be used to examine your colon directly (sigmoidoscopy or colonoscopy). This is done to check for the earliest forms of colorectal cancer.  Routine screening usually begins at age 62.  Direct examination of the colon should be repeated every 5-10 years through 71 years of age. However, you may need to be screened more often if early forms of precancerous polyps or small growths are found.  Skin Cancer  Check your skin from head to toe regularly.  Tell your health care provider about any new moles or changes in moles, especially if there is a change in a mole's shape or color.  Also tell your health care provider if you have a mole that is larger than the size of a pencil eraser.  Always use sunscreen. Apply sunscreen liberally and repeatedly throughout the day.  Protect yourself by wearing long sleeves, pants, a wide-brimmed hat, and sunglasses whenever you are outside.  Heart disease, diabetes, and high blood pressure  High blood pressure causes heart disease and increases the risk of stroke. High blood pressure is more likely to develop in: ? People who have blood pressure in the high end of the normal range (130-139/85-89 mm Hg). ? People who are overweight or obese. ? People who are African American.  If you are 74-46 years of age, have your blood  pressure checked every 3-5 years. If you are 63 years of age or older, have your blood pressure checked every year. You should have your blood pressure measured twice-once when you are at a hospital or clinic, and once when you are not at a hospital or clinic. Record the average of the two measurements. To check your blood pressure when you are not at a hospital or clinic, you can use: ? An automated blood pressure machine at a pharmacy. ? A home blood pressure monitor.  If you are between 66 years and 57 years old, ask your health care provider if you should take aspirin to prevent strokes.  Have regular diabetes screenings. This involves taking a blood sample to check your fasting blood sugar level. ? If you are at a normal weight and have a low risk for diabetes, have this test once every three years after 70 years of age. ? If you are overweight and have a high risk for diabetes, consider being tested at a younger age or more often. Preventing infection Hepatitis B  If you have a higher risk for hepatitis B, you should be screened for this virus. You are considered at high risk for hepatitis B if: ? You were born in a country where hepatitis B is common. Ask your health care provider which countries are considered high risk. ? Your parents were born in a high-risk country, and you have not been immunized against hepatitis B (hepatitis B vaccine). ? You have HIV or AIDS. ? You use needles to inject street drugs. ? You live with someone who has hepatitis B. ? You have had sex with someone who has hepatitis B. ? You get hemodialysis treatment. ? You take certain medicines for conditions, including cancer, organ transplantation, and autoimmune conditions.  Hepatitis C  Blood testing is recommended for: ? Everyone born from [birth year] through 1965. ? Anyone with known risk factors for hepatitis C.  Sexually transmitted infections (STIs)  You should be screened for sexually transmitted  infections (STIs) including gonorrhea and chlamydia if: ? You are sexually active and are younger than 71 years of age. ? You are older than 71 years of age and your health care provider tells you that you are at risk for this type of infection. ? Your sexual activity has changed since you were last screened and you are at an increased risk for chlamydia or gonorrhea. Ask your health care provider if you are at risk.  If you do not have HIV, but are at risk, it may be recommended that you take a prescription medicine daily to prevent HIV infection. This is called pre-exposure prophylaxis (PrEP). You are considered at risk if: ? You are sexually active and do not regularly use condoms or know the HIV status of your partner(s). ? You take drugs by injection. ? You are sexually active with a partner who has HIV.  Talk with your health care provider about whether you are at high risk of being infected with HIV. If you choose to begin PrEP, you should first be tested for HIV. You should then be tested every 3 months for as long as you are taking PrEP. Pregnancy  If you are premenopausal and you may become pregnant, ask your health care provider about preconception counseling.  If you may become pregnant, take 400 to 800 micrograms (mcg) of folic acid every day.  If you want to prevent pregnancy, talk to your health care provider about birth control (contraception). Osteoporosis and menopause  Osteoporosis is a disease in which the bones lose minerals and strength with aging. This can result in serious bone fractures. Your risk for osteoporosis can be identified using a bone density scan.  If you are 27 years of age or older, or if you are at risk for osteoporosis and fractures, ask your health care provider if you should be screened.  Ask your health care provider whether you should take a calcium or vitamin D supplement to lower your risk for osteoporosis.  Menopause may have certain physical  symptoms and risks.  Hormone replacement therapy may reduce some of these symptoms and risks. Talk to your  health care provider about whether hormone replacement therapy is right for you. Follow these instructions at home:  Schedule regular health, dental, and eye exams.  Stay current with your immunizations.  Do not use any tobacco products including cigarettes, chewing tobacco, or electronic cigarettes.  If you are pregnant, do not drink alcohol.  If you are breastfeeding, limit how much and how often you drink alcohol.  Limit alcohol intake to no more than 1 drink per day for nonpregnant women. One drink equals 12 ounces of beer, 5 ounces of wine, or 1 ounces of hard liquor.  Do not use street drugs.  Do not share needles.  Ask your health care provider for help if you need support or information about quitting drugs.  Tell your health care provider if you often feel depressed.  Tell your health care provider if you have ever been abused or do not feel safe at home. This information is not intended to replace advice given to you by your health care provider. Make sure you discuss any questions you have with your health care provider. Document Released: 02/18/2011 Document Revised: 01/11/2016 Document Reviewed: 05/09/2015 Elsevier Interactive Patient Education  [employer].

## 2017-03-07 ENCOUNTER — Encounter: Payer: Self-pay | Admitting: *Deleted

## 2017-03-07 NOTE — Progress Notes (Signed)
I have reviewed this visit and discussed with Howell Rucks, RN, BSN, and agree with her documentation.   Discussed with Lauren after visit that patient should not receive FIT testing, instead needs colonoscopy due to her iron deficiency anemia. Lauren was going to call patient & let her know to get colonoscopy instead.  [name], MD

## 2017-03-10 ENCOUNTER — Telehealth: Payer: Self-pay | Admitting: *Deleted

## 2017-03-10 ENCOUNTER — Encounter: Payer: Self-pay | Admitting: *Deleted

## 2017-03-10 NOTE — Telephone Encounter (Signed)
Patient returned call. Understands not to do FIT and will discuss colonoscopy at [place] with PCP on 8/1. Aware that paperwork for DM shoes has been faxed and recommended contacting podiatry office for f/u on that.  Patient asked about status of order for Rolator. Let her know that this requires face to face for insurance to cover and she can discuss this with PCP on 8/1.   Hubbard Hartshorn, RN, BSN

## 2017-03-10 NOTE — Telephone Encounter (Signed)
Attempted to reach patient to discuss FIT. PCP wants patient to have colonoscopy instead of FIT. FIT that pt received from lab staff can be thrown away.  Also, wanted to let patient know that PCP faxed form for diabetic shoes.  Hubbard Hartshorn, RN, BSN

## 2017-03-12 ENCOUNTER — Telehealth: Payer: Self-pay | Admitting: *Deleted

## 2017-03-12 NOTE — Telephone Encounter (Signed)
Received fax from [facility] requesting to change accu-chek compact plus test strips to aviva plus strips.  Patient has Accu-Chek Aviva Plus meter.  Rx changed to Accu-chek Aviva Plus test strips.  Derl Barrow, RN

## 2017-03-19 ENCOUNTER — Encounter: Payer: Self-pay | Admitting: Family Medicine

## 2017-03-19 ENCOUNTER — Ambulatory Visit (INDEPENDENT_AMBULATORY_CARE_PROVIDER_SITE_OTHER): Payer: Medicare Other | Admitting: Family Medicine

## 2017-03-19 VITALS — BP 136/72 | HR 89 | Temp 97.5°F | Ht 62.0 in | Wt 137.0 lb

## 2017-03-19 DIAGNOSIS — E1165 Type 2 diabetes mellitus with hyperglycemia: Secondary | ICD-10-CM

## 2017-03-19 DIAGNOSIS — E038 Other specified hypothyroidism: Secondary | ICD-10-CM

## 2017-03-19 DIAGNOSIS — D649 Anemia, unspecified: Secondary | ICD-10-CM

## 2017-03-19 DIAGNOSIS — R21 Rash and other nonspecific skin eruption: Secondary | ICD-10-CM | POA: Diagnosis not present

## 2017-03-19 DIAGNOSIS — D509 Iron deficiency anemia, unspecified: Secondary | ICD-10-CM | POA: Diagnosis not present

## 2017-03-19 DIAGNOSIS — E039 Hypothyroidism, unspecified: Secondary | ICD-10-CM | POA: Diagnosis not present

## 2017-03-19 DIAGNOSIS — Z794 Long term (current) use of insulin: Secondary | ICD-10-CM | POA: Diagnosis not present

## 2017-03-19 DIAGNOSIS — Z9181 History of falling: Secondary | ICD-10-CM

## 2017-03-19 DIAGNOSIS — E118 Type 2 diabetes mellitus with unspecified complications: Secondary | ICD-10-CM | POA: Diagnosis not present

## 2017-03-19 DIAGNOSIS — IMO0002 Reserved for concepts with insufficient information to code with codable children: Secondary | ICD-10-CM

## 2017-03-19 MED ORDER — ROSUVASTATIN CALCIUM 20 MG PO TABS: 20.0000 mg | ORAL_TABLET | Freq: Every day | ORAL | 3 refills | Status: DC

## 2017-03-19 MED ORDER — GLUCOSE BLOOD VI STRP: ORAL_STRIP | 12 refills | Status: DC

## 2017-03-19 MED ORDER — TETANUS-DIPHTH-ACELL PERTUSSIS 5-2.5-18.5 LF-MCG/0.5 IM SUSP: 0.5000 mL | Freq: Once | INTRAMUSCULAR | 0 refills | Status: AC

## 2017-03-19 MED ORDER — ROLLATOR MISC: 0 refills | Status: DC

## 2017-03-19 MED ORDER — ALBUTEROL SULFATE HFA 108 (90 BASE) MCG/ACT IN AERS: INHALATION_SPRAY | RESPIRATORY_TRACT | 0 refills | Status: DC

## 2017-03-19 NOTE — Patient Instructions (Signed)
Anemia: -continue iron -checking blood levels today -keep appointment for GI doctor  Thyroid: -checking thyroid lab today  Skin: -referring to dermatologist  Diabetes: -sent in new strips -schedule your eye exam -checking urine today  Cholesterol: -sent in crestor for you  Refilled albuterol to use as needed  Follow up with me in 2 months, sooner if needed

## 2017-03-19 NOTE — Assessment & Plan Note (Signed)
Recheck CBC today Keep appointment with GI doctor in 2 days to discuss colonoscopy Continue iron supplementation, titrated as needed based on hemoglobin response.

## 2017-03-19 NOTE — Assessment & Plan Note (Addendum)
Refill of test strips today. Follow-up in 6 weeks to discuss diabetes in more detail. Encouraged to schedule eye exam. Check urine microalbumin today as she is no longer taking an ACE inhibitor, will restart as needed ASCVD risk score is elevated such that she needs high intensity statin. I sent in a new prescription for Crestor today.

## 2017-03-19 NOTE — Progress Notes (Signed)
Date of Visit: 03/19/2017   HPI:  [name] presents to follow-up.  Anemia: Taking daily iron supplement. Has appointment in 2 days to see gastroenterologist to discuss colonoscopy due to her iron deficiency. Tolerating iron well. Due for recheck of CBC today.  Subclinical hypothyroidism: Taking levothyroxine 25 g daily. Due for recheck of TSH.  Albuterol refill: Request refill of albuterol inhaler. Uses only on occasion when she is sick. No prior diagnosis of COPD or asthma. No history of smoking.  Hyperlipidemia: Has not been taking statin. Previously was prescribed Crestor 20 mg daily. ASCVD risk score is above 10%, needs high intensity statin. Agreeable to restarting this.  Diabetes: Needs refill of test strips for her new meter. Has not been able to check her sugars due to not having test strips. Is no longer taking quinapril. Has handout with eye doctors, but has not yet scheduled her eye exam.  Rash: Recently began noticing dry scaly spots scattered throughout her body. She is concerned she has skin cancer.  Mobility issues: Requesting Rollator prescription to help her with mobility. Has been borrowing her sisters Rollator, but would like her own. No recent falls, but it has come close to falling.  ROS: See HPI.  [place]: history of rheumatoid arthritis, hypothyroidism, type 2 diabetes, hypertension, GERD, osteopenia, iron deficiency anemia  PHYSICAL EXAM: BP 136/72   Pulse 89   Temp (!) 97.5 F (36.4 C) (Oral)   Ht 5\' 2"  (1.575 m)   Wt 137 lb (62.1 kg)   SpO2 98%   BMI 25.06 kg/m  Gen: no acute distress, pleasant, cooperative, well appearing HEENT: normocephalic, atraumatic. No thyroid nodules or masses palpable. Lungs: normal work of breathing  Neuro: alert, grossly nonfocal, spech normal Skin: patchy scaly areas of rash on left anterior breast, posterior neck, legs, arm  ASSESSMENT/PLAN:  Health maintenance:  -Given prescription for Tdap, encouraged to get this at her  pharmacy -Has appointment for mammogram -Checking urine microalbumin today -Keep appointment with GI for colonoscopy  Iron deficiency anemia Recheck CBC today Keep appointment with GI doctor in 2 days to discuss colonoscopy Continue iron supplementation, titrated as needed based on hemoglobin response.  Hypothyroidism Subclinical hypothyroidism. Recheck TSH today after recently starting Synthroid.  DM (diabetes mellitus), type 2, uncontrolled ([place]) Refill of test strips today. Follow-up in 6 weeks to discuss diabetes in more detail. Encouraged to schedule eye exam. Check urine microalbumin today as she is no longer taking an ACE inhibitor, will restart as needed ASCVD risk score is elevated such that she needs high intensity statin. I sent in a new prescription for Crestor today.  At high risk for falls No recent falls, but has come close. Requesting Rollator prescription. Given this today to help with her impaired mobility.  Albuterol refill for when necessary use, only uses when she is sick with a cold.  Rash: Patchy sections of dry skin that are scaly, almost appears to be psoriatic in nature. With her history of rheumatoid arthritis, could be related to autoimmune illness. Refer to dermatology for further evaluation given all the other issues we addressed today.  FOLLOW UP: Follow up in 2 months with me for chronic medical problems Referring to dermatology  [place] J. [name], [place]

## 2017-03-19 NOTE — Assessment & Plan Note (Signed)
No recent falls, but has come close. Requesting Rollator prescription. Given this today to help with her impaired mobility.

## 2017-03-19 NOTE — Assessment & Plan Note (Signed)
Subclinical hypothyroidism. Recheck TSH today after recently starting Synthroid.

## 2017-03-20 ENCOUNTER — Ambulatory Visit: Payer: Medicare Other

## 2017-03-20 LAB — TSH: TSH: 13.34 u[IU]/mL — AB (ref 0.450–4.500)

## 2017-03-20 LAB — CBC
HEMATOCRIT: 28.8 % — AB (ref 34.0–46.6)
Hemoglobin: 8.6 g/dL — ABNORMAL LOW (ref 11.1–15.9)
MCH: 24.9 pg — ABNORMAL LOW (ref 26.6–33.0)
MCHC: 29.9 g/dL — AB (ref 31.5–35.7)
MCV: 84 fL (ref 79–97)
PLATELETS: 366 10*3/uL (ref 150–379)
RBC: 3.45 x10E6/uL — ABNORMAL LOW (ref 3.77–5.28)
RDW: 17.5 % — AB (ref 12.3–15.4)
WBC: 4.8 10*3/uL (ref 3.4–10.8)

## 2017-03-20 LAB — MICROALBUMIN / CREATININE URINE RATIO
Creatinine, Urine: 45.6 mg/dL
MICROALBUM., U, RANDOM: 1672.3 ug/mL
Microalb/Creat Ratio: 3667.3 mg/g creat — ABNORMAL HIGH (ref 0.0–30.0)

## 2017-03-21 ENCOUNTER — Other Ambulatory Visit: Payer: Self-pay | Admitting: Family Medicine

## 2017-03-21 ENCOUNTER — Ambulatory Visit: Payer: Medicare Other | Admitting: Internal Medicine

## 2017-03-21 MED ORDER — GLUCOSE BLOOD VI STRP: ORAL_STRIP | 12 refills | Status: DC

## 2017-03-23 ENCOUNTER — Other Ambulatory Visit: Payer: Self-pay | Admitting: Family Medicine

## 2017-03-26 ENCOUNTER — Ambulatory Visit
Admission: RE | Admit: 2017-03-26 | Discharge: 2017-03-26 | Disposition: A | Discharge status: Home / Self Care | Payer: Medicare Other | Source: Ambulatory Visit | Attending: Family Medicine | Admitting: Family Medicine

## 2017-03-26 ENCOUNTER — Ambulatory Visit (INDEPENDENT_AMBULATORY_CARE_PROVIDER_SITE_OTHER): Payer: Self-pay | Admitting: Orthotics

## 2017-03-26 DIAGNOSIS — Z1231 Encounter for screening mammogram for malignant neoplasm of breast: Secondary | ICD-10-CM

## 2017-03-26 DIAGNOSIS — M2012 Hallux valgus (acquired), left foot: Secondary | ICD-10-CM

## 2017-03-26 DIAGNOSIS — L97521 Non-pressure chronic ulcer of other part of left foot limited to breakdown of skin: Secondary | ICD-10-CM

## 2017-03-26 DIAGNOSIS — M129 Arthropathy, unspecified: Secondary | ICD-10-CM | POA: Diagnosis not present

## 2017-03-26 DIAGNOSIS — E1142 Type 2 diabetes mellitus with diabetic polyneuropathy: Secondary | ICD-10-CM

## 2017-03-31 ENCOUNTER — Telehealth: Payer: Self-pay | Admitting: Family Medicine

## 2017-03-31 DIAGNOSIS — R809 Proteinuria, unspecified: Secondary | ICD-10-CM

## 2017-03-31 DIAGNOSIS — Z7409 Other reduced mobility: Secondary | ICD-10-CM

## 2017-03-31 MED ORDER — LEVOTHYROXINE SODIUM 50 MCG PO TABS: 50.0000 ug | ORAL_TABLET | Freq: Every day | ORAL | 1 refills | Status: DC

## 2017-03-31 NOTE — Telephone Encounter (Signed)
Called patient to discuss results TSH high - will increase synthroid to 44mcg daily. Need recheck in 6 weeks.  Markedly elevated urine albumin - will refer to nephrology. Patient agreeable.  She also states she is having trouble getting a walker (previously prescribed). Was apparently told we need to [facility] to [facility]. Will send message to clinic RN Lauren to attempt to figure this out.  Still anemic - she rescheduled her GI appointment for September.  Patient appreciative.  [name], MD

## 2017-04-01 DIAGNOSIS — H35372 Puckering of macula, left eye: Secondary | ICD-10-CM | POA: Diagnosis not present

## 2017-04-01 DIAGNOSIS — E113513 Type 2 diabetes mellitus with proliferative diabetic retinopathy with macular edema, bilateral: Secondary | ICD-10-CM | POA: Diagnosis not present

## 2017-04-01 LAB — HM DIABETES EYE EXAM

## 2017-04-01 MED ORDER — ROLLATOR MISC: 0 refills | Status: DC

## 2017-04-01 NOTE — Telephone Encounter (Signed)
Order entered today. Thanks! [name], MD

## 2017-04-01 NOTE — Telephone Encounter (Signed)
Staff message sent to Darlina Guys at [facility] notifying her that order for Rolator has been placed in [place].  Hubbard Hartshorn, RN, BSN

## 2017-04-01 NOTE — Telephone Encounter (Signed)
If you place the order for Rolator in Epic and let me know when that's done, I will send a message to Darlina Guys [facility] Rep). If you don't find an order for Rolator you can use "DME Other" and type in Rolator. Since [facility] is on Epic they are able to retrieve demographics, insurance and OV notes so nothing needs to be faxed.   Thanks, Ander Purpura

## 2017-04-02 NOTE — Progress Notes (Signed)

## 2017-04-14 ENCOUNTER — Other Ambulatory Visit: Payer: Self-pay | Admitting: Family Medicine

## 2017-04-17 ENCOUNTER — Other Ambulatory Visit: Payer: Self-pay | Admitting: Family Medicine

## 2017-04-25 DIAGNOSIS — E113513 Type 2 diabetes mellitus with proliferative diabetic retinopathy with macular edema, bilateral: Secondary | ICD-10-CM | POA: Diagnosis not present

## 2017-04-25 DIAGNOSIS — H25041 Posterior subcapsular polar age-related cataract, right eye: Secondary | ICD-10-CM | POA: Diagnosis not present

## 2017-04-25 DIAGNOSIS — H524 Presbyopia: Secondary | ICD-10-CM | POA: Diagnosis not present

## 2017-04-25 DIAGNOSIS — H2511 Age-related nuclear cataract, right eye: Secondary | ICD-10-CM | POA: Diagnosis not present

## 2017-04-27 ENCOUNTER — Other Ambulatory Visit: Payer: Self-pay | Admitting: Family Medicine

## 2017-05-02 ENCOUNTER — Encounter: Payer: Self-pay | Admitting: Podiatry

## 2017-05-02 ENCOUNTER — Ambulatory Visit (INDEPENDENT_AMBULATORY_CARE_PROVIDER_SITE_OTHER): Payer: Medicare Other | Admitting: Podiatry

## 2017-05-02 DIAGNOSIS — E1142 Type 2 diabetes mellitus with diabetic polyneuropathy: Secondary | ICD-10-CM | POA: Diagnosis not present

## 2017-05-02 DIAGNOSIS — B351 Tinea unguium: Secondary | ICD-10-CM

## 2017-05-02 DIAGNOSIS — M79609 Pain in unspecified limb: Secondary | ICD-10-CM

## 2017-05-02 NOTE — Progress Notes (Addendum)
Patient ID: [name], female   DOB: 02/22/1946, 71 y.o.   MRN: [id number] Complaint:  Visit Type: Patient presents  to my office for  preventative foot care services. Complaint: Patient states" my nails have grown long and thick and become painful to walk and wear shoes" Patient has been diagnosed with DM with neuropathy.. The patient presents for preventative foot care services. No changes to ROS.  No evidence of blisters/ulcers.  Podiatric Exam: Vascular: dorsalis pedis and posterior tibial pulses are not  palpable bilateral due to swollen feet/legs. Capillary return is immediate. Temperature gradient is WNL.   Sensorium: Normal Semmes Weinstein monofilament test. Normal tactile sensation bilaterally. Nail Exam: Pt has thick disfigured discolored nails with subungual debris noted bilateral entire nail hallux through second toes both feet.; Ulcer Exam: There is no evidence of ulcer or pre-ulcerative changes or infection. Orthopedic Exam: Muscle tone and strength are WNL. No limitations in general ROM. No crepitus or effusions noted. Arthritis  IPJ right hallux.  HAV 1st MPJ  Left foot. Skin: No Porokeratosis. No infection or ulcers,      Diagnosis:  Onychomycosis, , Pain in right toe, pain in left toes  Treatment & Plan Procedures and Treatment: Consent by patient was obtained for treatment procedures. The patient understood the discussion of treatment and procedures well. All questions were answered thoroughly reviewed. Debridement of mycotic and hypertrophic toenails, 1 through 5 bilateral and clearing of subungual debris. No ulceration, no infection noted.  Padding on diabetic insole left foot for heel discomfort.    Return Visit-Office Procedure: Patient instructed to return to the office for a follow up visit 4 months for continued evaluation and treatment. We can then order a new pair of diabetic shoes.   Gardiner Barefoot DPM

## 2017-05-07 ENCOUNTER — Encounter: Payer: Self-pay | Admitting: Family Medicine

## 2017-05-08 DIAGNOSIS — Z7409 Other reduced mobility: Secondary | ICD-10-CM | POA: Diagnosis not present

## 2017-05-12 ENCOUNTER — Ambulatory Visit: Payer: Medicare Other | Admitting: Internal Medicine

## 2017-05-12 ENCOUNTER — Emergency Department (HOSPITAL_COMMUNITY): Payer: Medicare Other

## 2017-05-12 ENCOUNTER — Encounter (HOSPITAL_COMMUNITY): Payer: Self-pay

## 2017-05-12 ENCOUNTER — Emergency Department (HOSPITAL_COMMUNITY)
Admission: EM | Admit: 2017-05-12 | Discharge: 2017-05-12 | Disposition: A | Discharge status: Home / Self Care | Payer: Medicare Other | Attending: Emergency Medicine | Admitting: Emergency Medicine

## 2017-05-12 DIAGNOSIS — W2209XA Striking against other stationary object, initial encounter: Secondary | ICD-10-CM | POA: Insufficient documentation

## 2017-05-12 DIAGNOSIS — Z7984 Long term (current) use of oral hypoglycemic drugs: Secondary | ICD-10-CM | POA: Diagnosis not present

## 2017-05-12 DIAGNOSIS — E119 Type 2 diabetes mellitus without complications: Secondary | ICD-10-CM | POA: Diagnosis not present

## 2017-05-12 DIAGNOSIS — S098XXA Other specified injuries of head, initial encounter: Secondary | ICD-10-CM | POA: Diagnosis not present

## 2017-05-12 DIAGNOSIS — Y998 Other external cause status: Secondary | ICD-10-CM | POA: Diagnosis not present

## 2017-05-12 DIAGNOSIS — G4489 Other headache syndrome: Secondary | ICD-10-CM | POA: Diagnosis not present

## 2017-05-12 DIAGNOSIS — F21 Schizotypal disorder: Secondary | ICD-10-CM | POA: Diagnosis not present

## 2017-05-12 DIAGNOSIS — Z79899 Other long term (current) drug therapy: Secondary | ICD-10-CM | POA: Diagnosis not present

## 2017-05-12 DIAGNOSIS — Y92512 Supermarket, store or market as the place of occurrence of the external cause: Secondary | ICD-10-CM | POA: Diagnosis not present

## 2017-05-12 DIAGNOSIS — S0990XA Unspecified injury of head, initial encounter: Secondary | ICD-10-CM | POA: Insufficient documentation

## 2017-05-12 DIAGNOSIS — I10 Essential (primary) hypertension: Secondary | ICD-10-CM | POA: Insufficient documentation

## 2017-05-12 DIAGNOSIS — R42 Dizziness and giddiness: Secondary | ICD-10-CM | POA: Diagnosis not present

## 2017-05-12 DIAGNOSIS — Y9389 Activity, other specified: Secondary | ICD-10-CM | POA: Diagnosis not present

## 2017-05-12 DIAGNOSIS — S199XXA Unspecified injury of neck, initial encounter: Secondary | ICD-10-CM | POA: Diagnosis not present

## 2017-05-12 LAB — URINALYSIS, ROUTINE W REFLEX MICROSCOPIC
BILIRUBIN URINE: NEGATIVE
GLUCOSE, UA: NEGATIVE mg/dL
KETONES UR: NEGATIVE mg/dL
LEUKOCYTES UA: NEGATIVE
Nitrite: NEGATIVE
PH: 5 (ref 5.0–8.0)
Protein, ur: 100 mg/dL — AB
Specific Gravity, Urine: 1.009 (ref 1.005–1.030)

## 2017-05-12 LAB — CBC
HCT: 26.2 % — ABNORMAL LOW (ref 36.0–46.0)
HEMOGLOBIN: 8 g/dL — AB (ref 12.0–15.0)
MCH: 25.8 pg — ABNORMAL LOW (ref 26.0–34.0)
MCHC: 30.5 g/dL (ref 30.0–36.0)
MCV: 84.5 fL (ref 78.0–100.0)
PLATELETS: 330 10*3/uL (ref 150–400)
RBC: 3.1 MIL/uL — AB (ref 3.87–5.11)
RDW: 16.1 % — ABNORMAL HIGH (ref 11.5–15.5)
WBC: 4.9 10*3/uL (ref 4.0–10.5)

## 2017-05-12 LAB — BASIC METABOLIC PANEL
ANION GAP: 4 — AB (ref 5–15)
BUN: 25 mg/dL — ABNORMAL HIGH (ref 6–20)
CHLORIDE: 108 mmol/L (ref 101–111)
CO2: 24 mmol/L (ref 22–32)
CREATININE: 1.13 mg/dL — AB (ref 0.44–1.00)
Calcium: 8.9 mg/dL (ref 8.9–10.3)
GFR calc non Af Amer: 48 mL/min — ABNORMAL LOW (ref >60)
GFR, EST AFRICAN AMERICAN: 55 mL/min — AB (ref >60)
Glucose, Bld: 96 mg/dL (ref 65–99)
Potassium: 4.2 mmol/L (ref 3.5–5.1)
SODIUM: 136 mmol/L (ref 135–145)

## 2017-05-12 LAB — CBG MONITORING, ED: Glucose-Capillary: 100 mg/dL — ABNORMAL HIGH (ref 65–99)

## 2017-05-12 MED ORDER — ACETAMINOPHEN 325 MG PO TABS
650.0000 mg | ORAL_TABLET | Freq: Once | ORAL | Status: AC
  Administered 2017-05-12: 650 mg via ORAL
  Filled 2017-05-12: qty 2

## 2017-05-12 NOTE — ED Notes (Signed)
Patient ambulated with walker to bathroom, attempting to give urine sample now.

## 2017-05-12 NOTE — ED Notes (Signed)
Patient to CT.

## 2017-05-12 NOTE — ED Provider Notes (Signed)
[facility] DEPT Provider Note   CSN: [id number] Arrival date & time: 05/12/17  1922     History   Chief Complaint Chief Complaint  Patient presents with  . Fall  . Near Syncope    HPI [name] is a 71 y.o. female.  HPI  71 year old female who presents with head injury. History of DM,  HTN, HLD, and schizotypal personality disorder. Reports she was taking a short cut out of [employer], she ducked under the door overhang of where they push shopping carts through, states she got up too soon and hit the top of her head on the overhang. Felt dizzy, but did not have LOC or syncope after. States she did not fall, was able to walk to customer service to report the accident. Complains of headache. No nausea, vomiting, chest pain, dyspnea, fevers, vision or speech changes. Initially denies numbness/weakness, but states maybe her right arm felt weaker during the exam.   Past Medical History:  Diagnosis Date  . Diabetes mellitus age 36  . Hyperlipidemia   . Hypertension   . Retinal detachment, old, partial    left  . Retinopathy due to secondary diabetes mellitus ([place])    L>R, laser 3/07  . Schizotypal personality disorder   . Thyroid disease     Patient Active Problem List   Diagnosis Date Noted  . Iron deficiency anemia 03/19/2017  . Diabetic ulcer of toe ([place]) 12/12/2015  . Vision disturbance 03/20/2015  . Memory deficit 03/20/2015  . Bilateral lower extremity edema 06/06/2014  . Sinus congestion 12/08/2013  . At high risk for falls 11/10/2012  . Weight loss 01/22/2012  . Cataracts, bilateral 02/13/2011  . DIABETIC  RETINOPATHY 03/17/2007  . DIABETIC PERIPHERAL NEUROPATHY 03/17/2007  . Hypothyroidism 10/16/2006  . DM (diabetes mellitus), type 2, uncontrolled ([place]) 10/16/2006  . OBESITY, NOS 10/16/2006  . HYPERTENSION, BENIGN SYSTEMIC 10/16/2006  . GASTROESOPHAGEAL REFLUX, NO ESOPHAGITIS 10/16/2006  . Rheumatoid arthritis ([place]) 10/16/2006  . OSTEOPENIA 10/16/2006  .  INCONTINENCE, URGE 10/16/2006    Past Surgical History:  Procedure Laterality Date  . BREAST EXCISIONAL BIOPSY    . BREAST SURGERY    . CATARACT EXTRACTION     left   . ROTATOR CUFF REPAIR  1990's   left  . Thyroid radiation ablation     for Graves Disease  . TRANSTHORACIC ECHOCARDIOGRAM      EF55-65%, nml - 12/14/2004    OB History    No data available       Home Medications    Prior to Admission medications   Medication Sig Start Date End Date Taking? Authorizing Provider  albuterol (PROAIR HFA) 108 (90 Base) MCG/ACT inhaler INHALE 2 PUFFS INTO THE LUNGS EVERY 6 HOURS AS NEEDED FOR WHEEZING OR SHORTNESS OF BREATH 03/19/17  Yes [name], MD  ferrous sulfate 325 (65 FE) MG tablet Take 1 tablet (325 mg total) by mouth every other day. 02/11/17  Yes [name], MD  hydrochlorothiazide (HYDRODIURIL) 25 MG tablet TAKE 1 TABLET BY MOUTH EVERY DAY Patient taking differently: TAKE 25MG  BY MOUTH EVERY DAY 04/29/17  Yes [name], MD  levothyroxine (SYNTHROID, LEVOTHROID) 50 MCG tablet Take 1 tablet (50 mcg total) by mouth daily. 03/31/17  Yes [name], MD  quinapril (ACCUPRIL) 5 MG tablet TAKE 1 TABLET BY MOUTH EVERY DAY Patient taking differently: TAKE 5MG  BY MOUTH EVERY DAY 12/27/16  Yes [name], MD  rosuvastatin (CRESTOR) 20 MG tablet Take  1 tablet (20 mg total) by mouth daily. 03/19/17  Yes [name], MD  ACCU-CHEK [facility] LANCETS lancets USE three times daily AS DIRECTED 02/14/17   [name], MD  Blood Glucose Monitoring Suppl (ACCU-CHEK AVIVA) device Use as instructed to check blood sugar 3 times daily 02/11/17 02/11/18  [name], MD  glucose blood (ACCU-CHEK AVIVA PLUS) test strip Use as instructed to check blood sugar 3 times daily. ICD-10 code: E11.9 03/21/17   Alveda Reasons, MD  glucose blood (ACCU-CHEK AVIVA PLUS) test strip Use as instructed to check sugar once per day 03/19/17   [name], MD  Lancet Devices (MICROLET NEXT LANCING DEVICE) MISC 1 each by Does not apply route 3 (three) times daily. Check blood sugar 3 times daily 12/05/16   Virginia Crews, MD  metFORMIN (GLUCOPHAGE) 1000 MG tablet Take 1 tablet (1,000 mg total) by mouth 2 (two) times daily with a meal. 07/08/16   [name], MD  MICROLET LANCETS MISC Use to test blood glucose three times daily. ICD-10 code: E11.65. 12/05/16   Virginia Crews, MD  Misc. Devices (ROLLATOR) MISC Use to aid mobility 04/01/17   [name], MD    Family History Family History  Problem Relation Age of Onset  . Heart disease Mother   . Hypertension Mother   . Heart attack Mother   . Breast cancer Other   . Colon cancer Brother   . Cancer Brother   . Colon cancer Maternal Grandmother   . Pulmonary fibrosis Father   . Diabetes Daughter   . Cancer Brother   . Liver cancer Brother     Social History Social History  Substance Use Topics  . Smoking status: Never Smoker  . Smokeless tobacco: Never Used  . Alcohol use No     Allergies   Rosiglitazone maleate and Rosiglitazone   Review of Systems Review of Systems  Constitutional: Negative for fever.  Respiratory: Negative for shortness of breath.   Cardiovascular: Negative for chest pain.  Gastrointestinal: Negative for abdominal pain.  Neurological: Positive for headaches.  Hematological: Does not bruise/bleed easily.  All other systems reviewed and are negative.    Physical Exam Updated Vital Signs BP (!) 148/76   Pulse 80   Temp 98.5 F (36.9 C) (Oral)   Resp 14   Ht 5\' 2"  (1.575 m)   Wt 63.5 kg (140 lb)   SpO2 99%   BMI 25.61 kg/m   Physical Exam Physical Exam  Nursing note and vitals reviewed. Constitutional: Well developed, well nourished, non-toxic, and in no acute distress Head: Normocephalic and atraumatic.  Mouth/Throat: Oropharynx is clear and moist.  Neck: Normal range of motion. Neck supple.    Cardiovascular: Normal rate and regular rhythm.   Pulmonary/Chest: Effort normal and breath sounds normal.  Abdominal: Soft. There is no tenderness. There is no rebound and no guarding.  Musculoskeletal: Normal range of motion.  Skin: Skin is warm and dry.  Psychiatric: Cooperative Neurological:  Alert, oriented to person, place, time, and situation. Memory grossly in tact. Fluent speech. No dysarthria or aphasia.  Cranial nerves: VF are full.  EOMI without nystagmus. No gaze deviation. Facial muscles symmetric with activation. Sensation to light touch over face in tact bilaterally. Hearing grossly in tact. Palate elevates symmetrically. Head turn and shoulder shrug are intact. Tongue midline.  Reflexes defered.  Muscle bulk and tone normal. Antigravity in all 4 extremities.  Equal hand grip.  Sensation to  light touch is in tact throughout in bilateral upper and lower extremities. Coordination reveals no dysmetria with finger to nose. Ambulates with walker (baseline) with steady gait.    ED Treatments / Results  Labs (all labs ordered are listed, but only abnormal results are displayed) Labs Reviewed  BASIC METABOLIC PANEL - Abnormal; Notable for the following:       Result Value   BUN 25 (*)    Creatinine, Ser 1.13 (*)    GFR calc non Af Amer 48 (*)    GFR calc Af Amer 55 (*)    Anion gap 4 (*)    All other components within normal limits  CBC - Abnormal; Notable for the following:    RBC 3.10 (*)    Hemoglobin 8.0 (*)    HCT 26.2 (*)    MCH 25.8 (*)    RDW 16.1 (*)    All other components within normal limits  CBG MONITORING, ED - Abnormal; Notable for the following:    Glucose-Capillary 100 (*)    All other components within normal limits  URINALYSIS, ROUTINE W REFLEX MICROSCOPIC    EKG  EKG Interpretation  Date/Time:  Monday May 12 2017 20:22:34 EDT Ventricular Rate:  80 PR Interval:    QRS Duration: 97 QT Interval:  401 QTC Calculation: 463 R  Axis:   -43 Text Interpretation:  Sinus rhythm Left axis deviation Consider right ventricular hypertrophy Confirmed by Brantley Stage [phone]) on 05/12/2017 8:25:58 PM       Radiology Ct Head Wo Contrast  Result Date: 05/12/2017 CLINICAL DATA:  Dizziness and fall at [employer]. History of hypertension, diabetes. EXAM: CT HEAD WITHOUT CONTRAST CT CERVICAL SPINE WITHOUT CONTRAST TECHNIQUE: Multidetector CT imaging of the head and cervical spine was performed following the standard protocol without intravenous contrast. Multiplanar CT image reconstructions of the cervical spine were also generated. COMPARISON:  None. FINDINGS: CT HEAD FINDINGS BRAIN: No intraparenchymal hemorrhage, mass effect nor midline shift. The ventricles and sulci are normal for age. Patchy supratentorial white matter hypodensities within normal range for patient's age, though non-specific are most compatible with chronic small vessel ischemic disease. No acute large vascular territory infarcts. No abnormal extra-axial fluid collections. Basal cisterns are patent. VASCULAR: Moderate to severe calcific atherosclerosis of the carotid siphons. SKULL: No skull fracture. No significant scalp soft tissue swelling. SINUSES/ORBITS: The mastoid air-cells and included paranasal sinuses are well-aerated.The included ocular globes and orbital contents are non-suspicious. OTHER: Patient is edentulous. CT CERVICAL SPINE FINDINGS ALIGNMENT: Straightened lordosis. Vertebral bodies in alignment. SKULL BASE AND VERTEBRAE: Cervical vertebral bodies and posterior elements are intact. Large C5 inferior endplate Schmorl's node, small C6 inferior endplate Schmorl's node. Old moderate T3 compression fracture with approximate 50% ventral wedging. Mild C5-6 disc height loss compatible with degenerative disc. C1-2 articulation maintained with severe arthropathy. Large amount of pannus compatible with CPPD associated with cortical irregularity dense. Severe sternal  clavicular osteoarthrosis with erosions. SOFT TISSUES AND SPINAL CANAL: Nonacute. Mild calcific atherosclerosis RIGHT carotid bifurcation. DISC LEVELS: No significant osseous canal stenosis or neural foraminal narrowing. UPPER CHEST: Lung apices are clear. OTHER: None. IMPRESSION: CT HEAD: 1. No acute intracranial process. 2. Negative noncontrast CT HEAD for age. CT CERVICAL SPINE: 1. No acute fracture or malalignment. Moderate old T3 compression fracture. 2. Pannus and odontoid erosions seen with CPPD, less likely inflammatory arthropathy. Electronically Signed   By: Elon Alas M.D.   On: 05/12/2017 20:55   Ct Cervical Spine Wo Contrast  Result Date: 05/12/2017  CLINICAL DATA:  Dizziness and fall at [employer]. History of hypertension, diabetes. EXAM: CT HEAD WITHOUT CONTRAST CT CERVICAL SPINE WITHOUT CONTRAST TECHNIQUE: Multidetector CT imaging of the head and cervical spine was performed following the standard protocol without intravenous contrast. Multiplanar CT image reconstructions of the cervical spine were also generated. COMPARISON:  None. FINDINGS: CT HEAD FINDINGS BRAIN: No intraparenchymal hemorrhage, mass effect nor midline shift. The ventricles and sulci are normal for age. Patchy supratentorial white matter hypodensities within normal range for patient's age, though non-specific are most compatible with chronic small vessel ischemic disease. No acute large vascular territory infarcts. No abnormal extra-axial fluid collections. Basal cisterns are patent. VASCULAR: Moderate to severe calcific atherosclerosis of the carotid siphons. SKULL: No skull fracture. No significant scalp soft tissue swelling. SINUSES/ORBITS: The mastoid air-cells and included paranasal sinuses are well-aerated.The included ocular globes and orbital contents are non-suspicious. OTHER: Patient is edentulous. CT CERVICAL SPINE FINDINGS ALIGNMENT: Straightened lordosis. Vertebral bodies in alignment. SKULL BASE AND VERTEBRAE:  Cervical vertebral bodies and posterior elements are intact. Large C5 inferior endplate Schmorl's node, small C6 inferior endplate Schmorl's node. Old moderate T3 compression fracture with approximate 50% ventral wedging. Mild C5-6 disc height loss compatible with degenerative disc. C1-2 articulation maintained with severe arthropathy. Large amount of pannus compatible with CPPD associated with cortical irregularity dense. Severe sternal clavicular osteoarthrosis with erosions. SOFT TISSUES AND SPINAL CANAL: Nonacute. Mild calcific atherosclerosis RIGHT carotid bifurcation. DISC LEVELS: No significant osseous canal stenosis or neural foraminal narrowing. UPPER CHEST: Lung apices are clear. OTHER: None. IMPRESSION: CT HEAD: 1. No acute intracranial process. 2. Negative noncontrast CT HEAD for age. CT CERVICAL SPINE: 1. No acute fracture or malalignment. Moderate old T3 compression fracture. 2. Pannus and odontoid erosions seen with CPPD, less likely inflammatory arthropathy. Electronically Signed   By: Elon Alas M.D.   On: 05/12/2017 20:55    Procedures Procedures (including critical care time)  Medications Ordered in ED Medications  acetaminophen (TYLENOL) tablet 650 mg (650 mg Oral Given 05/12/17 2037)     Initial Impression / Assessment and Plan / ED Course  I have reviewed the triage vital signs and the nursing notes.  Pertinent labs & imaging results that were available during my care of the patient were reviewed by me and considered in my medical decision making (see chart for details).     Presents with head injury. Seems minor. Although, initially she thought her right side was low, she has full strength on direct testing, and able to ambulate steadily with her walker (at baseline). CT head and cervical spine without traumatic injuries. Blood work reassuring. States she is at baseline now. No other injuries on exam. Stable for discharge home. Strict return and follow-up instructions  reviewed. She expressed understanding of all discharge instructions and felt comfortable with the plan of care.   Final Clinical Impressions(s) / ED Diagnoses   Final diagnoses:  Injury of head, initial encounter    New Prescriptions New Prescriptions   No medications on file     Forde Dandy, MD 05/12/17 2240

## 2017-05-12 NOTE — Discharge Instructions (Signed)
Your CT head and cervical spine does not show any serious injury.  Please continue to take tylenol for headache as needed.  Return for worsening symptoms, including fever, confusion, difficulty walking, or any other symptoms concerning to you.

## 2017-05-12 NOTE — ED Triage Notes (Signed)
Patient here for evaluation of a fall today while at [place].  Stated she got dizzy and fell.  Did NOT have a syncopal episode. Patient states that right arm is weaker now. Can hold arms up and grip strength is normal.  VAN negative.

## 2017-05-12 NOTE — ED Notes (Signed)
EDP at bedside  

## 2017-05-12 NOTE — ED Notes (Signed)
Patient Alert and oriented X4. Stable and ambulatory. Patient verbalized understanding of the discharge instructions.  Patient belongings were taken by the patient.  

## 2017-05-28 ENCOUNTER — Encounter: Payer: Self-pay | Admitting: Physician Assistant

## 2017-05-28 ENCOUNTER — Other Ambulatory Visit (INDEPENDENT_AMBULATORY_CARE_PROVIDER_SITE_OTHER): Payer: Medicare Other

## 2017-05-28 ENCOUNTER — Ambulatory Visit (INDEPENDENT_AMBULATORY_CARE_PROVIDER_SITE_OTHER): Payer: Medicare Other | Admitting: Physician Assistant

## 2017-05-28 VITALS — BP 132/80 | HR 80 | Ht 62.0 in | Wt 143.0 lb

## 2017-05-28 DIAGNOSIS — R1310 Dysphagia, unspecified: Secondary | ICD-10-CM | POA: Diagnosis not present

## 2017-05-28 DIAGNOSIS — D509 Iron deficiency anemia, unspecified: Secondary | ICD-10-CM

## 2017-05-28 LAB — CBC WITH DIFFERENTIAL/PLATELET
BASOS PCT: 0.6 % (ref 0.0–3.0)
Basophils Absolute: 0 10*3/uL (ref 0.0–0.1)
Eosinophils Absolute: 0.1 10*3/uL (ref 0.0–0.7)
Eosinophils Relative: 2.1 % (ref 0.0–5.0)
Lymphocytes Relative: 20.6 % (ref 12.0–46.0)
Lymphs Abs: 1.3 10*3/uL (ref 0.7–4.0)
MCHC: 31.5 g/dL (ref 30.0–36.0)
MCV: 86.1 fl (ref 78.0–100.0)
MONOS PCT: 7.5 % (ref 3.0–12.0)
Monocytes Absolute: 0.5 10*3/uL (ref 0.1–1.0)
NEUTROS ABS: 4.4 10*3/uL (ref 1.4–7.7)
Neutrophils Relative %: 69.2 % (ref 43.0–77.0)
PLATELETS: 323 10*3/uL (ref 150.0–400.0)
RBC: 2.96 Mil/uL — ABNORMAL LOW (ref 3.87–5.11)
RDW: 16.1 % — AB (ref 11.5–15.5)
WBC: 6.3 10*3/uL (ref 4.0–10.5)

## 2017-05-28 LAB — IBC PANEL
Iron: 22 ug/dL — ABNORMAL LOW (ref 42–145)
SATURATION RATIOS: 10.1 % — AB (ref 20.0–50.0)
TRANSFERRIN: 155 mg/dL — AB (ref 212.0–360.0)

## 2017-05-28 LAB — FERRITIN: Ferritin: 104 ng/mL (ref 10.0–291.0)

## 2017-05-28 MED ORDER — NA SULFATE-K SULFATE-MG SULF 17.5-3.13-1.6 GM/177ML PO SOLN: 1.0000 | ORAL | 0 refills | Status: DC

## 2017-05-28 NOTE — Progress Notes (Signed)
Assessment and plans reviewed  

## 2017-05-28 NOTE — Progress Notes (Signed)
Chief Complaint: Iron deficiency anemia, dysphagia  HPI:  [name] is a 70 year old African-American female with a past medical history of hypertension, rheumatoid arthritis, poorly controlled diabetes and psychiatric illness, who was referred to me by [name], MD for a complaint of iron deficiency anemia and dysphagia .      Patient was last seen in our clinic 04/13/12 by Dr. Henrene Pastor and at that time was sent for weight loss and the need for a screening colonoscopy. At that time it was recommended she have a colonoscopy for screening purposes. She never had this completed.    Recent labs were completed 05/12/17 and showed a hemoglobin of 8. MCV was normal. Per in-depth record review patient's hemoglobin was around 11.5 in 2013, but patient's next set of labs 01/14/17 showed a drop to 8.4. Patient's hemoglobin has stayed around this number over the past 5 months. Patient then had an iron profile 01/21/17 which showed iron low at 24, percent saturation low at 13. It appears patient was started on oral iron at that time.    Today, the patient presents to clinic and explains that she was told she was anemic in the early summer. She has been on Ferrous Sulfate 325 mg daily since that time. Patient denies any shortness of breath, dizziness or excess fatigue.   Patient does explain today that she seems to have a problem eating potatoes. She tells me that potatoes in all forms including french fries seem to get stuck in her throat on the way down, if "I don't drink anything with them". Apparently she used to eat these with no trouble and over the past 4-5 months has noted that these have been just getting stuck. If the patient drinks water with them she is fine. She denies any heartburn or reflux. Patient does describe "burping a lot".   It should be noted that patient's history is somewhat limited by her mental illness. It is obvious at time of exam that the patient gets easily distracted and may not be  the best historian.   Patient denies fever, chills, blood in her stool, melena, weight loss, anorexia or abdominal pain  Past Medical History:  Diagnosis Date  . Diabetes mellitus age 10  . Hyperlipidemia   . Hypertension   . Retinal detachment, old, partial    left  . Retinopathy due to secondary diabetes mellitus ([place])    L>R, laser 3/07  . Schizotypal personality disorder ([place])   . Thyroid disease     Past Surgical History:  Procedure Laterality Date  . BREAST EXCISIONAL BIOPSY    . BREAST SURGERY    . CATARACT EXTRACTION     left   . ROTATOR CUFF REPAIR  1990's   left  . Thyroid radiation ablation     for Graves Disease  . TRANSTHORACIC ECHOCARDIOGRAM      EF55-65%, nml - 12/14/2004    Current Outpatient Prescriptions  Medication Sig Dispense Refill  . ACCU-CHEK SOFTCLIX LANCETS lancets USE three times daily AS DIRECTED 100 each 11  . albuterol (PROAIR HFA) 108 (90 Base) MCG/ACT inhaler INHALE 2 PUFFS INTO THE LUNGS EVERY 6 HOURS AS NEEDED FOR WHEEZING OR SHORTNESS OF BREATH 8 g 0  . Blood Glucose Monitoring Suppl (ACCU-CHEK AVIVA) device Use as instructed to check blood sugar 3 times daily 1 each 0  . ferrous sulfate 325 (65 FE) MG tablet Take 1 tablet (325 mg total) by mouth every other day. 45 tablet 3  .  glucose blood (ACCU-CHEK AVIVA PLUS) test strip Use as instructed to check sugar once per day 100 each 12  . hydrochlorothiazide (HYDRODIURIL) 25 MG tablet TAKE 1 TABLET BY MOUTH EVERY DAY (Patient taking differently: TAKE 25MG  BY MOUTH EVERY DAY) 30 tablet 1  . Lancet Devices (MICROLET NEXT LANCING DEVICE) MISC 1 each by Does not apply route 3 (three) times daily. Check blood sugar 3 times daily 1 each 0  . levothyroxine (SYNTHROID, LEVOTHROID) 50 MCG tablet Take 1 tablet (50 mcg total) by mouth daily. 30 tablet 1  . metFORMIN (GLUCOPHAGE) 1000 MG tablet Take 1 tablet (1,000 mg total) by mouth 2 (two) times daily with a meal. 60 tablet 0  . MICROLET LANCETS MISC Use  to test blood glucose three times daily. ICD-10 code: E11.65. 100 each 12  . Misc. Devices (ROLLATOR) MISC Use to aid mobility 1 each 0  . quinapril (ACCUPRIL) 5 MG tablet TAKE 1 TABLET BY MOUTH EVERY DAY (Patient taking differently: TAKE 5MG  BY MOUTH EVERY DAY) 30 tablet 2  . rosuvastatin (CRESTOR) 20 MG tablet Take 1 tablet (20 mg total) by mouth daily. 90 tablet 3   No current facility-administered medications for this visit.     Allergies as of 05/28/2017 - Review Complete 05/28/2017  Allergen Reaction Noted  . Rosiglitazone maleate Other (See Comments) 02/20/2006  . Rosiglitazone  11/28/2016    Family History  Problem Relation Age of Onset  . Heart disease Mother   . Hypertension Mother   . Heart attack Mother   . Breast cancer Other   . Colon cancer Brother   . Colon cancer Maternal Grandmother   . Pulmonary fibrosis Father   . Diabetes Daughter   . Liver disease Sister        transplant, liver  . Liver cancer Brother     Social History   Social History  . Marital status: Widowed    Spouse name: N/A  . Number of children: 3  . Years of education: 12   Occupational History  . Retired- [employer] History Main Topics  . Smoking status: Never Smoker  . Smokeless tobacco: Never Used  . Alcohol use No  . Drug use: No  . Sexual activity: Not Currently   Other Topics Concern  . Not on file   Social History Narrative   3 daughters.  Takes care of 8 grandchildren at home.  Lots of stress.  no tobacco, no etoh.  Father is Cristy Hilts- deceased 2003/11/17. Sister - Casimer Leek.         Health Care POA:    Emergency Contact: daughter, Janett Billow (c) [phone]   End of Life Plan:    Who lives with you: self   Any pets: none   Diet: Pt has a varied diet of protein starch and vegetables. Pt reports eating a lot of potatoes and starches.  Does not follow diabetic diet.    Exercise: Pt does exercises 2x a week at [facility] [employer].    Seatbelts: Pt reports wearing  seatbelt when in vehicles.    Sun Exposure/Protection: Pt reports not using sun protection.   Hobbies:  Attends Estate manager/land agent through [employer] Monday through Friday 9-5pm.  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      Current Social History  03/06/2017   Who lives at home: Lives alone in one level home; grandson stays occasionally 03/06/2017    Transportation: Bus or taxi currently as car is not working  03/06/2017   Important Relationships & Pets: "Everybody I meet." No pets 03/06/2017    Current Stressors: Transportation, bus line 03/06/2017   Work / Education:  Retired/ 12 th grade 03/06/2017   Religious / Personal Beliefs: "I believe in God, [place], and the resurrection." 03/06/2017   Interests / Fun: crafts, yard work, going to [employer] 03/06/2017   L. Ducatte, RN, BSN                                                                                                              Review of Systems:    Constitutional: No weight loss, fever or chills Skin: No rash  Cardiovascular: No chest pain   Respiratory: No SOB Gastrointestinal: See HPI and otherwise negative Genitourinary: No dysuria Neurological: No headache Musculoskeletal: No new muscle or joint pain Hematologic: No bleeding  Psychiatric: Positive for schizotypal personality disorder   Physical Exam:  Vital signs: BP 132/80   Pulse 80   Ht 5\' 2"  (1.575 m)   Wt 143 lb (64.9 kg)   BMI 26.16 kg/m    Constitutional:   AA female appears to be in NAD, Well developed, Well nourished, alert and cooperative Head:  Normocephalic and atraumatic. Eyes:   PEERL, EOMI. No icterus. Conjunctiva pink. Ears:  Normal auditory acuity. Neck:  Supple Throat: Oral cavity and pharynx without inflammation, swelling or lesion.  Respiratory: Respirations even and unlabored. Lungs clear to auscultation bilaterally.   No wheezes, crackles, or rhonchi.  Cardiovascular: Normal S1, S2. No MRG. Regular rate and rhythm. No peripheral edema,  cyanosis or pallor.  Gastrointestinal:  Soft, nondistended, nontender. No rebound or guarding. Normal bowel sounds. No appreciable masses or hepatomegaly. Rectal:  Not performed.  Msk:  Symmetrical without gross deformities. Without edema, no deformity or joint abnormality.  Neurologic:  Alert and  oriented x4;  grossly normal neurologically.  Skin:   Dry and intact without significant lesions or rashes. Psychiatric: Oriented to person, place and time.   RELEVANT LABS AND IMAGING: CBC    Component Value Date/Time   WBC 4.9 05/12/2017 1955   RBC 3.10 (L) 05/12/2017 1955   HGB 8.0 (L) 05/12/2017 1955   HGB 8.6 (L) 03/19/2017 1224   HCT 26.2 (L) 05/12/2017 1955   HCT 28.8 (L) 03/19/2017 1224   PLT 330 05/12/2017 1955   PLT 366 03/19/2017 1224   MCV 84.5 05/12/2017 1955   MCV 84 03/19/2017 1224   MCH 25.8 (L) 05/12/2017 1955   MCHC 30.5 05/12/2017 1955   RDW 16.1 (H) 05/12/2017 1955   RDW 17.5 (H) 03/19/2017 1224   LYMPHSABS 1.6 01/21/2017 0940   MONOABS 0.5 12/01/2006 1808   EOSABS 0.1 01/21/2017 0940   BASOSABS 0.0 01/21/2017 0940    CMP     Component Value Date/Time   NA 136 05/12/2017 1955   NA 138 01/21/2017 0940   K 4.2 05/12/2017 1955   CL 108 05/12/2017 1955   CO2 24 05/12/2017 1955   GLUCOSE 96 05/12/2017 1955   BUN 25 (H)  05/12/2017 1955   BUN 23 01/21/2017 0940   CREATININE 1.13 (H) 05/12/2017 1955   CREATININE 0.82 07/05/2014 1606   CALCIUM 8.9 05/12/2017 1955   PROT 9.0 (H) 01/21/2017 0940   ALBUMIN 2.9 (L) 01/21/2017 0940   AST 10 01/21/2017 0940   ALT 6 01/21/2017 0940   ALKPHOS 81 01/21/2017 0940   BILITOT 0.2 01/21/2017 0940   GFRNONAA 48 (L) 05/12/2017 1955   GFRAA 55 (L) 05/12/2017 1955    Assessment: 1. IDA: Noted in May and June of this year, most recent CBC 05/12/17 continues to show a decreased hemoglobin; Consider GI source of blood loss vs other 2. Dysphagia: Patient describes only potatoes and potato products getting stuck in her throat,  this does not happen if she drinks water; question esophageal stricture versus dysmotility versus other  Plan: 1. Recommend an EGD and colonoscopy for further evaluation of iron deficiency anemia. Patient is also due for screening colonoscopy. She may also benefit from dilation at time of endoscopy. Did discuss risks, benefits, limitations and alternatives and the patient agrees to proceed. 2. Would recommend the patient continue her iron supplementation as previously prescribed. 3. Reviewed anti-dysphagia measures. Would recommend that the patient drink water or another form of liquid when eating potatoes. 4. Will recheck labs today including CBC and iron studies 5. Of note patient tells me she may have trouble finding a ride for these procedures and will let us know if she needs to reschedule 6. Patient to return to clinic per recommendations from Dr. Henrene Pastor after time of procedures.  Ellouise Newer, PA-C [facility] 05/28/2017, 11:26 AM  Cc: [name], MD

## 2017-05-28 NOTE — Patient Instructions (Addendum)
Your physician has requested that you go to the basement for lab work before leaving today  You have been scheduled for an endoscopy and colonoscopy. Please follow the written instructions given to you at your visit today. Please pick up your prep supplies at the pharmacy within the next 1-3 days. If you use inhalers (even only as needed), please bring them with you on the day of your procedure. Your physician has requested that you go to www.startemmi.com and enter the access code given to you at your visit today. This web site gives a general overview about your procedure. However, you should still follow specific instructions given to you by our office regarding your preparation for the procedure.   

## 2017-05-29 ENCOUNTER — Telehealth: Payer: Self-pay | Admitting: Physician Assistant

## 2017-05-29 ENCOUNTER — Other Ambulatory Visit: Payer: Self-pay | Admitting: Family Medicine

## 2017-05-29 MED ORDER — HYDROCHLOROTHIAZIDE 25 MG PO TABS: 25.0000 mg | ORAL_TABLET | Freq: Every day | ORAL | 1 refills | Status: DC

## 2017-05-29 MED ORDER — LEVOTHYROXINE SODIUM 50 MCG PO TABS: 50.0000 ug | ORAL_TABLET | Freq: Every day | ORAL | 0 refills | Status: DC

## 2017-05-29 NOTE — Telephone Encounter (Signed)
Levin Erp, [place] sent to Jeoffrey Massed, RN        Proceed with EGD and Colonoscopy as scheduled today. Would also recommend patient start OTC IRON 325 mg qd. Thanks-JLL

## 2017-05-29 NOTE — Telephone Encounter (Signed)
Left message on machine to call back  

## 2017-06-02 NOTE — Telephone Encounter (Signed)
The pt has been advised of the recommendations and will call if needed.

## 2017-06-03 DIAGNOSIS — D631 Anemia in chronic kidney disease: Secondary | ICD-10-CM | POA: Diagnosis not present

## 2017-06-03 DIAGNOSIS — R809 Proteinuria, unspecified: Secondary | ICD-10-CM | POA: Diagnosis not present

## 2017-06-03 DIAGNOSIS — M069 Rheumatoid arthritis, unspecified: Secondary | ICD-10-CM | POA: Diagnosis not present

## 2017-06-03 DIAGNOSIS — N2581 Secondary hyperparathyroidism of renal origin: Secondary | ICD-10-CM | POA: Diagnosis not present

## 2017-06-03 DIAGNOSIS — I129 Hypertensive chronic kidney disease with stage 1 through stage 4 chronic kidney disease, or unspecified chronic kidney disease: Secondary | ICD-10-CM | POA: Diagnosis not present

## 2017-06-03 DIAGNOSIS — E1129 Type 2 diabetes mellitus with other diabetic kidney complication: Secondary | ICD-10-CM | POA: Diagnosis not present

## 2017-06-03 DIAGNOSIS — N390 Urinary tract infection, site not specified: Secondary | ICD-10-CM | POA: Diagnosis not present

## 2017-06-03 DIAGNOSIS — N183 Chronic kidney disease, stage 3 (moderate): Secondary | ICD-10-CM | POA: Diagnosis not present

## 2017-06-04 ENCOUNTER — Other Ambulatory Visit: Payer: Self-pay | Admitting: Nephrology

## 2017-06-04 DIAGNOSIS — N183 Chronic kidney disease, stage 3 unspecified: Secondary | ICD-10-CM

## 2017-06-05 ENCOUNTER — Ambulatory Visit (HOSPITAL_COMMUNITY)
Admission: RE | Admit: 2017-06-05 | Discharge: 2017-06-05 | Disposition: A | Discharge status: Home / Self Care | Payer: Medicare Other | Source: Ambulatory Visit | Attending: Family | Admitting: Family

## 2017-06-05 ENCOUNTER — Ambulatory Visit (INDEPENDENT_AMBULATORY_CARE_PROVIDER_SITE_OTHER): Payer: Medicare Other | Admitting: Family

## 2017-06-05 ENCOUNTER — Encounter: Payer: Self-pay | Admitting: Family

## 2017-06-05 VITALS — BP 140/82 | HR 66 | Temp 97.0°F | Resp 18 | Ht 62.0 in | Wt 142.4 lb

## 2017-06-05 DIAGNOSIS — I779 Disorder of arteries and arterioles, unspecified: Secondary | ICD-10-CM | POA: Diagnosis not present

## 2017-06-05 DIAGNOSIS — R609 Edema, unspecified: Secondary | ICD-10-CM | POA: Diagnosis not present

## 2017-06-05 DIAGNOSIS — L97521 Non-pressure chronic ulcer of other part of left foot limited to breakdown of skin: Secondary | ICD-10-CM | POA: Diagnosis not present

## 2017-06-05 DIAGNOSIS — N183 Chronic kidney disease, stage 3 (moderate): Secondary | ICD-10-CM | POA: Diagnosis not present

## 2017-06-05 NOTE — Patient Instructions (Addendum)
  To decrease swelling in your feet and legs: Elevate feet above slightly bent knees, feet above heart, overnight and 3-4 times per day for 20 minutes.    Peripheral Vascular Disease Peripheral vascular disease (PVD) is a disease of the blood vessels that are not part of your heart and brain. A simple term for PVD is poor circulation. In most cases, PVD narrows the blood vessels that carry blood from your heart to the rest of your body. This can result in a decreased supply of blood to your arms, legs, and internal organs, like your stomach or kidneys. However, it most often affects a person's lower legs and feet. There are two types of PVD.  Organic PVD. This is the more common type. It is caused by damage to the structure of blood vessels.  Functional PVD. This is caused by conditions that make blood vessels contract and tighten (spasm).  Without treatment, PVD tends to get worse over time. PVD can also lead to acute ischemic limb. This is when an arm or limb suddenly has trouble getting enough blood. This is a medical emergency. Follow these instructions at home:  Take medicines only as told by your doctor.  Do not use any tobacco products, including cigarettes, chewing tobacco, or electronic cigarettes. If you need help quitting, ask your doctor.  Lose weight if you are overweight, and maintain a healthy weight as told by your doctor.  Eat a diet that is low in fat and cholesterol. If you need help, ask your doctor.  Exercise regularly. Ask your doctor for some good activities for you.  Take good care of your feet. ? Wear comfortable shoes that fit well. ? Check your feet often for any cuts or sores. Contact a doctor if:  You have cramps in your legs while walking.  You have leg pain when you are at rest.  You have coldness in a leg or foot.  Your skin changes.  You are unable to get or have an erection (erectile dysfunction).  You have cuts or sores on your feet that  are not healing. Get help right away if:  Your arm or leg turns cold and blue.  Your arms or legs become red, warm, swollen, painful, or numb.  You have chest pain or trouble breathing.  You suddenly have weakness in your face, arm, or leg.  You become very confused or you cannot speak.  You suddenly have a very bad headache.  You suddenly cannot see. This information is not intended to replace advice given to you by your health care provider. Make sure you discuss any questions you have with your health care provider. Document Released: 10/30/2009 Document Revised: 01/11/2016 Document Reviewed: 01/13/2014 Elsevier Interactive Patient Education  2017 Elsevier Inc.  

## 2017-06-05 NOTE — Progress Notes (Signed)
[facility] [place]   CC: Follow up peripheral artery occlusive disease  History of Present Illness [name] is a 71 y.o. female whom Dr. Con Memos referred for evaluation of arterial and venous disease with recent non-healing wound. The patient states she does not really know how the wound first started but it took about a month to heal. She was seen at the wound care center but says she has now been discharged. She denies any claudication symptoms. She denies rest pain. Other medical problems include diabetes, schizotypal personality disorder, hypertension and thyroid disease all of which are stable.  When Dr. Oneida Alar evaluated pt in July 2017, the wound on her foot had nearly healed, but definitely had evidence of peripheral arterial disease.  Venous duplex exam on 01/02/2016 showed no evidence of reflux.  Dr. Oneida Alar advised pt to follow-up with [place] in 6 months time for repeat noninvasive arterial exam. She'll follow-up sooner if she has any worsening of the wound on her foot or a new wound appears. At that point we would consider an arteriogram.   Pt was evaluated at an urgent care facility on 10-06-16 for left great toe ulcer and edema in her feet. Clindamycin prescription was provided. (review of records). Pt was advised to follow up with her PCP and podiatrist in 2-3 days.   She was in wound care treatment at [facility], Dr. Con Memos, since March 2018, has been discharged since her foot ulcer healed.   She is seeing a nephrologist now; serum creatinine on 05-12-17 was 1.13, eGFR was 55 (review of records).    Pt Diabetic: Yes, A1C was 7.2 (03-06-17)  Pt smoker: non-smoker  Pt meds include: Statin :No Betablocker: No ASA: Yes Other anticoagulants/antiplatelets: no     Past Medical History:  Diagnosis Date  . Diabetes mellitus age 12  . Hyperlipidemia   . Hypertension   . Retinal detachment, old, partial    left  . Retinopathy due to secondary diabetes  mellitus ([place])    L>R, laser 3/07  . Schizotypal personality disorder ([place])   . Thyroid disease     Social History Social History  Substance Use Topics  . Smoking status: Never Smoker  . Smokeless tobacco: Never Used  . Alcohol use No    Family History Family History  Problem Relation Age of Onset  . Heart disease Mother   . Hypertension Mother   . Heart attack Mother   . Breast cancer Other   . Colon cancer Brother   . Colon cancer Maternal Grandmother   . Pulmonary fibrosis Father   . Diabetes Daughter   . Liver disease Sister        transplant, liver  . Liver cancer Brother     Past Surgical History:  Procedure Laterality Date  . BREAST EXCISIONAL BIOPSY    . BREAST SURGERY    . CATARACT EXTRACTION     left   . ROTATOR CUFF REPAIR  1990's   left  . Thyroid radiation ablation     for Graves Disease  . TRANSTHORACIC ECHOCARDIOGRAM      EF55-65%, nml - 12/14/2004    Allergies  Allergen Reactions  . Rosiglitazone Maleate Other (See Comments)    REACTION: Difficulty walking, Fatigue, shortness of breath  . Rosiglitazone     Current Outpatient Prescriptions  Medication Sig Dispense Refill  . ACCU-CHEK SOFTCLIX LANCETS lancets USE three times daily AS DIRECTED 100 each 11  . albuterol (PROAIR HFA) 108 (90 Base) MCG/ACT  inhaler INHALE 2 PUFFS INTO THE LUNGS EVERY 6 HOURS AS NEEDED FOR WHEEZING OR SHORTNESS OF BREATH 8 g 0  . Blood Glucose Monitoring Suppl (ACCU-CHEK AVIVA) device Use as instructed to check blood sugar 3 times daily 1 each 0  . ferrous sulfate 325 (65 FE) MG tablet Take 1 tablet (325 mg total) by mouth every other day. 45 tablet 3  . glucose blood (ACCU-CHEK AVIVA PLUS) test strip Use as instructed to check sugar once per day 100 each 12  . hydrochlorothiazide (HYDRODIURIL) 25 MG tablet Take 1 tablet (25 mg total) by mouth daily. 90 tablet 1  . Lancet Devices (MICROLET NEXT LANCING DEVICE) MISC 1 each by Does not apply route 3 (three) times daily.  Check blood sugar 3 times daily 1 each 0  . levothyroxine (SYNTHROID, LEVOTHROID) 50 MCG tablet Take 1 tablet (50 mcg total) by mouth daily. 90 tablet 0  . metFORMIN (GLUCOPHAGE) 1000 MG tablet Take 1 tablet (1,000 mg total) by mouth 2 (two) times daily with a meal. 60 tablet 0  . MICROLET LANCETS MISC Use to test blood glucose three times daily. ICD-10 code: E11.65. 100 each 12  . Misc. Devices (ROLLATOR) MISC Use to aid mobility 1 each 0  . Na Sulfate-K Sulfate-Mg Sulf (SUPREP BOWEL PREP KIT) 17.5-3.13-1.6 GM/180ML SOLN Take 1 kit by mouth as directed. 324 mL 0  . quinapril (ACCUPRIL) 5 MG tablet TAKE 1 TABLET BY MOUTH EVERY DAY (Patient taking differently: TAKE 5MG BY MOUTH EVERY DAY) 30 tablet 2  . rosuvastatin (CRESTOR) 20 MG tablet Take 1 tablet (20 mg total) by mouth daily. 90 tablet 3   No current facility-administered medications for this visit.     ROS: See HPI for pertinent positives and negatives.   Physical Examination  Vitals:   06/05/17 1204  BP: 140/82  Pulse: 66  Resp: 18  Temp: (!) 97 F (36.1 C)  TempSrc: Oral  SpO2: 100%  Weight: 142 lb 6.4 oz (64.6 kg)  Height: 5' 2"  (1.575 m)   Body mass index is 26.05 kg/m.  General: A&O x 3, WDWN, female. Gait: using cane Eyes: PERRLA. Pulmonary: Respirations are non labored, CTAB, good air movement Cardiac: regular rhythm, no detected murmur.         Carotid Bruits Right Left   Negative Negative  Aorta is not palpable. Radial pulses: 2+ palpable bilaterally                           VASCULAR EXAM: Extremities without ischemic changes, without Gangrene; without open wounds: 1-2+ non pitting and pitting edema in both feet, ankles, and lower legs.  LE Pulses Right Left       FEMORAL  faintly palpable  2+ palpable        POPLITEAL  not palpable   not palpable        POSTERIOR TIBIAL  2+ palpable   not palpable        DORSALIS PEDIS      ANTERIOR TIBIAL 2+ palpable  2+ palpable    Abdomen: soft, NT, no palpable masses. Skin: no rashes, see Extremities Musculoskeletal: no muscle wasting or atrophy.         Neurologic: A&O X 3; Appropriate Affect ; SENSATION: normal; MOTOR FUNCTION:  moving all extremities equally, motor strength 5/5 throughout. Speech is fluent/normal. CN 2-12 intact.    ASSESSMENT: [name] is a 71 y.o. female who presents with: peripheral artery occlusive disease most likely secondary to long term diabetes mellitus. She denies ever using tobacco.  Ulcer at the dorsal aspect of her left 3rd toe has completely healed. She seems to have medial calcification of the arteries in her lower legs as the arterial vessels are non compressible, a diabetic complication, giving falsely very elevated ankle pressures that do not correspond to monophasic waveforms.  Arterial ankle waveforms are all biphasic, improved from monophasic, both of her dorsalis pedis pulses are 2+ palpable.    There is less dependent edema in her feet and lower legs since it appears that she has been elevating her legs often.   DATA  She had lower extremity noninvasive arterial exam in May 2017 which showed calcified vessels bilaterally but biphasic to triphasic waveforms and toe pressure of 101 on the left 102 on the right. A repeat of that arterial study in July 2017 showed similar findings.  Venous duplex exam 01/02/2016 showed no evidence of reflux.   ABI (Date: 06/05/2017):  R:   ABI: [place] (was [place] on 11-28-16),   PT: bi  DP: bi  TBI:  0.61 (was 0.74)  L:   ABI: [place] (was [place]),   PT: bi  DP: bi  TBI: 0.72 (was 0.70)  Bilateral ankle arteries remain calcified with non compressible vessels, all waveforms have improved to biphasic from monophasic.     PLAN:   I advised her to to safely walk as much as possible, and when not  walking to elevate her feet above her heart as often as possible to minimize dependent edema. Based on the patient's vascular studies and examination, pt will return to clinic in 9 months with ABI's. I advised her to notify us if she develops concerns re the circulation in her feet/legs.  I discussed in depth with the patient the nature of atherosclerosis, and emphasized the importance of maximal medical management including strict control of blood pressure, blood glucose, and lipid levels, obtaining regular exercise, and continued cessation of smoking.  The patient is aware that without maximal medical management the underlying atherosclerotic disease process will progress, limiting the benefit of any interventions.  The patient was given information about PAD including signs, symptoms, treatment, what symptoms should prompt the patient to seek immediate medical care, and risk reduction measures to take.  Clemon Chambers, RN, MSN, FNP-C [facility] Phone: [phone]  Clinic MD: Oneida Alar  06/05/17 4:32 PM

## 2017-06-13 ENCOUNTER — Other Ambulatory Visit: Payer: Medicare Other

## 2017-06-18 ENCOUNTER — Ambulatory Visit
Admission: RE | Admit: 2017-06-18 | Discharge: 2017-06-18 | Disposition: A | Discharge status: Home / Self Care | Payer: Medicare Other | Source: Ambulatory Visit | Attending: Nephrology | Admitting: Nephrology

## 2017-06-18 DIAGNOSIS — N183 Chronic kidney disease, stage 3 unspecified: Secondary | ICD-10-CM

## 2017-06-20 ENCOUNTER — Encounter: Payer: Self-pay | Admitting: Family Medicine

## 2017-06-20 ENCOUNTER — Ambulatory Visit (INDEPENDENT_AMBULATORY_CARE_PROVIDER_SITE_OTHER): Payer: Medicare Other | Admitting: Family Medicine

## 2017-06-20 VITALS — BP 144/76 | HR 90 | Temp 97.6°F | Ht 62.0 in | Wt 140.6 lb

## 2017-06-20 DIAGNOSIS — Z23 Encounter for immunization: Secondary | ICD-10-CM

## 2017-06-20 DIAGNOSIS — E11649 Type 2 diabetes mellitus with hypoglycemia without coma: Secondary | ICD-10-CM

## 2017-06-20 DIAGNOSIS — E039 Hypothyroidism, unspecified: Secondary | ICD-10-CM | POA: Diagnosis not present

## 2017-06-20 DIAGNOSIS — I129 Hypertensive chronic kidney disease with stage 1 through stage 4 chronic kidney disease, or unspecified chronic kidney disease: Secondary | ICD-10-CM | POA: Diagnosis not present

## 2017-06-20 DIAGNOSIS — M542 Cervicalgia: Secondary | ICD-10-CM | POA: Diagnosis not present

## 2017-06-20 DIAGNOSIS — N183 Chronic kidney disease, stage 3 (moderate): Secondary | ICD-10-CM | POA: Diagnosis not present

## 2017-06-20 LAB — POCT GLYCOSYLATED HEMOGLOBIN (HGB A1C): HEMOGLOBIN A1C: 6.4

## 2017-06-20 MED ORDER — TETANUS-DIPHTH-ACELL PERTUSSIS 5-2.5-18.5 LF-MCG/0.5 IM SUSP: 0.5000 mL | Freq: Once | INTRAMUSCULAR | 0 refills | Status: AC

## 2017-06-20 MED ORDER — BACLOFEN 1 MG/ML ORAL SUSPENSION: 2.5000 mg | Freq: Two times a day (BID) | ORAL | 0 refills | Status: DC | PRN

## 2017-06-20 NOTE — Assessment & Plan Note (Signed)
A1c today 6.4.  Discussed with patient that this is actually below the threshold of even calling her as having diabetes, given that she has not been on any diabetic medications.  We will continue to check this in 3 months to ensure it stays down.  She has for a long time had very uncontrolled diabetes, so I am somewhat concerned that her kidneys are driving her new glycemic control.  She has follow-up in place with nephrology.
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
## 2017-06-20 NOTE — Assessment & Plan Note (Signed)
Due for recheck of TSH, but patient did not want additional blood work drawn today.  I will wait and see if by chance her nephrologist happened to check it.  Otherwise we will check at follow-up.

## 2017-06-20 NOTE — Patient Instructions (Signed)
It was great to see you again today!  For neck pain - go get xray - printed prescription for muscle relaxer liquid - use caution as it might make you sleepy - try heating pad  Schedule follow up with your eye doctor for your vision problems. Please call them today to schedule an appointment.  We will check your thyroid next time if the kidney doctor did not order that  Flu shot today  Take tetanus shot prescription to your pharmacy  Follow up with me in 6 weeks, sooner if needed  Be well, Dr. [name]

## 2017-06-24 NOTE — Addendum Note (Signed)
Addended by: Lianne Cure A on: 06/24/2017 03:21 PM   Modules accepted: Orders

## 2017-06-26 DIAGNOSIS — E113593 Type 2 diabetes mellitus with proliferative diabetic retinopathy without macular edema, bilateral: Secondary | ICD-10-CM | POA: Diagnosis not present

## 2017-06-26 LAB — HM DIABETES EYE EXAM

## 2017-07-18 ENCOUNTER — Encounter: Payer: Self-pay | Admitting: Internal Medicine

## 2017-07-21 DIAGNOSIS — N2581 Secondary hyperparathyroidism of renal origin: Secondary | ICD-10-CM | POA: Diagnosis not present

## 2017-07-21 DIAGNOSIS — D631 Anemia in chronic kidney disease: Secondary | ICD-10-CM | POA: Diagnosis not present

## 2017-07-21 DIAGNOSIS — I129 Hypertensive chronic kidney disease with stage 1 through stage 4 chronic kidney disease, or unspecified chronic kidney disease: Secondary | ICD-10-CM | POA: Diagnosis not present

## 2017-07-21 DIAGNOSIS — R809 Proteinuria, unspecified: Secondary | ICD-10-CM | POA: Diagnosis not present

## 2017-07-21 DIAGNOSIS — E1122 Type 2 diabetes mellitus with diabetic chronic kidney disease: Secondary | ICD-10-CM | POA: Diagnosis not present

## 2017-07-21 DIAGNOSIS — E039 Hypothyroidism, unspecified: Secondary | ICD-10-CM | POA: Diagnosis not present

## 2017-07-21 DIAGNOSIS — N183 Chronic kidney disease, stage 3 (moderate): Secondary | ICD-10-CM | POA: Diagnosis not present

## 2017-07-30 ENCOUNTER — Telehealth: Payer: Self-pay | Admitting: Internal Medicine

## 2017-08-01 ENCOUNTER — Encounter: Payer: Medicare Other | Admitting: Internal Medicine

## 2017-08-01 ENCOUNTER — Telehealth: Payer: Self-pay | Admitting: Family Medicine

## 2017-08-01 MED ORDER — LEVOTHYROXINE SODIUM 75 MCG PO TABS: 75.0000 ug | ORAL_TABLET | Freq: Every day | ORAL | 0 refills | Status: DC

## 2017-08-01 NOTE — Telephone Encounter (Signed)
TSH done at nephrology office on 12/3 was 6.390 (results were faxed to me as part of that office visit). We need to increase the dose of her synthroid if the nephrologist hasn't already done so. I would recommend she go up to 78mcg daily. I tried to call her to discuss, but she did not answer. LVM asking her to call back. Please let me know when she returns the call.  [name], MD

## 2017-08-01 NOTE — Telephone Encounter (Signed)
Patient called back & I spoke with her. Her synthroid dose had not been adjusted. I sent in a new rx for 85mcg daily. Advised we need to recheck this in about 6 weeks. Patient appreciative.  [name], MD

## 2017-08-06 DIAGNOSIS — I129 Hypertensive chronic kidney disease with stage 1 through stage 4 chronic kidney disease, or unspecified chronic kidney disease: Secondary | ICD-10-CM | POA: Diagnosis not present

## 2017-08-06 DIAGNOSIS — R809 Proteinuria, unspecified: Secondary | ICD-10-CM | POA: Diagnosis not present

## 2017-08-06 DIAGNOSIS — N183 Chronic kidney disease, stage 3 (moderate): Secondary | ICD-10-CM | POA: Diagnosis not present

## 2017-08-06 DIAGNOSIS — N2581 Secondary hyperparathyroidism of renal origin: Secondary | ICD-10-CM | POA: Diagnosis not present

## 2017-08-06 DIAGNOSIS — D631 Anemia in chronic kidney disease: Secondary | ICD-10-CM | POA: Diagnosis not present

## 2017-08-14 ENCOUNTER — Other Ambulatory Visit (HOSPITAL_COMMUNITY): Payer: Self-pay | Admitting: *Deleted

## 2017-08-15 ENCOUNTER — Ambulatory Visit (HOSPITAL_COMMUNITY): Admission: RE | Admit: 2017-08-15 | Payer: Medicare Other | Source: Ambulatory Visit

## 2017-08-20 DIAGNOSIS — I129 Hypertensive chronic kidney disease with stage 1 through stage 4 chronic kidney disease, or unspecified chronic kidney disease: Secondary | ICD-10-CM | POA: Diagnosis not present

## 2017-08-22 ENCOUNTER — Encounter: Payer: Self-pay | Admitting: Podiatry

## 2017-08-22 ENCOUNTER — Ambulatory Visit: Payer: Medicare Other | Admitting: Podiatry

## 2017-08-22 DIAGNOSIS — E1159 Type 2 diabetes mellitus with other circulatory complications: Secondary | ICD-10-CM

## 2017-08-22 DIAGNOSIS — M129 Arthropathy, unspecified: Secondary | ICD-10-CM

## 2017-08-22 DIAGNOSIS — E1142 Type 2 diabetes mellitus with diabetic polyneuropathy: Secondary | ICD-10-CM | POA: Diagnosis not present

## 2017-08-22 DIAGNOSIS — M79609 Pain in unspecified limb: Secondary | ICD-10-CM

## 2017-08-22 DIAGNOSIS — M2012 Hallux valgus (acquired), left foot: Secondary | ICD-10-CM

## 2017-08-22 DIAGNOSIS — B351 Tinea unguium: Secondary | ICD-10-CM

## 2017-08-22 NOTE — Progress Notes (Addendum)
Patient ID: [name], female   DOB: 12-Jun-1946, 72 y.o.   MRN: [id number] Complaint:  Visit Type: Patient presents  to my office for  preventative foot care services. Complaint: Patient states" my nails have grown long and thick and become painful to walk and wear shoes" Patient has been diagnosed with DM with neuropathy.. The patient presents for preventative foot care services. No changes to ROS.  No evidence of blisters/ulcers.  Podiatric Exam: Vascular: dorsalis pedis and posterior tibial pulses are not  palpable bilateral due to swollen feet/legs. Capillary return is immediate. Temperature gradient is WNL.   Sensorium: Normal Semmes Weinstein monofilament test. Normal tactile sensation bilaterally. Nail Exam: Pt has thick disfigured discolored nails with subungual debris noted bilateral entire nail hallux through second toes both feet.; Ulcer Exam: There is no evidence of ulcer or pre-ulcerative changes or infection. Orthopedic Exam: Muscle tone and strength are WNL. No limitations in general ROM. No crepitus or effusions noted. Arthritis  IPJ right hallux.  HAV 1st MPJ  Left foot. Skin: No Porokeratosis. No infection or ulcers,      Diagnosis:  Onychomycosis, , Pain in right toe, pain in left toes,  Diabetes with angiopathy and neurology.  Treatment & Plan Procedures and Treatment: Consent by patient was obtained for treatment procedures. The patient understood the discussion of treatment and procedures well. All questions were answered thoroughly reviewed. Debridement of mycotic and hypertrophic toenails, 1 through 5 bilateral and clearing of subungual debris. No ulceration, no infection noted.  Patient to have new diabetic shoes ordered with  Betha.on January 15,2019.    Return Visit-Office Procedure: Patient instructed to return to the office for a follow up visit 4 months for continued evaluation and treatment.   Gardiner Barefoot DPM

## 2017-09-02 ENCOUNTER — Other Ambulatory Visit: Payer: Medicare Other

## 2017-09-04 DIAGNOSIS — I129 Hypertensive chronic kidney disease with stage 1 through stage 4 chronic kidney disease, or unspecified chronic kidney disease: Secondary | ICD-10-CM | POA: Diagnosis not present

## 2017-09-04 DIAGNOSIS — N183 Chronic kidney disease, stage 3 (moderate): Secondary | ICD-10-CM | POA: Diagnosis not present

## 2017-09-11 ENCOUNTER — Inpatient Hospital Stay (HOSPITAL_COMMUNITY): Admission: RE | Admit: 2017-09-11 | Payer: Medicare Other | Source: Ambulatory Visit

## 2017-09-16 ENCOUNTER — Other Ambulatory Visit: Payer: Medicare Other

## 2017-09-19 ENCOUNTER — Encounter (HOSPITAL_BASED_OUTPATIENT_CLINIC_OR_DEPARTMENT_OTHER): Payer: Medicare Other | Attending: Internal Medicine

## 2017-09-19 ENCOUNTER — Inpatient Hospital Stay (HOSPITAL_COMMUNITY): Admission: RE | Admit: 2017-09-19 | Payer: Medicare Other | Source: Ambulatory Visit

## 2017-09-19 ENCOUNTER — Telehealth: Payer: Self-pay | Admitting: Family Medicine

## 2017-09-19 DIAGNOSIS — I10 Essential (primary) hypertension: Secondary | ICD-10-CM | POA: Insufficient documentation

## 2017-09-19 DIAGNOSIS — L97821 Non-pressure chronic ulcer of other part of left lower leg limited to breakdown of skin: Secondary | ICD-10-CM | POA: Diagnosis not present

## 2017-09-19 DIAGNOSIS — L97212 Non-pressure chronic ulcer of right calf with fat layer exposed: Secondary | ICD-10-CM | POA: Diagnosis not present

## 2017-09-19 DIAGNOSIS — L97822 Non-pressure chronic ulcer of other part of left lower leg with fat layer exposed: Secondary | ICD-10-CM | POA: Insufficient documentation

## 2017-09-19 DIAGNOSIS — L97812 Non-pressure chronic ulcer of other part of right lower leg with fat layer exposed: Secondary | ICD-10-CM | POA: Insufficient documentation

## 2017-09-19 DIAGNOSIS — E119 Type 2 diabetes mellitus without complications: Secondary | ICD-10-CM | POA: Insufficient documentation

## 2017-09-19 DIAGNOSIS — I87333 Chronic venous hypertension (idiopathic) with ulcer and inflammation of bilateral lower extremity: Secondary | ICD-10-CM | POA: Insufficient documentation

## 2017-09-19 DIAGNOSIS — R609 Edema, unspecified: Secondary | ICD-10-CM | POA: Insufficient documentation

## 2017-09-19 DIAGNOSIS — S81802A Unspecified open wound, left lower leg, initial encounter: Secondary | ICD-10-CM | POA: Diagnosis not present

## 2017-09-19 DIAGNOSIS — S81801A Unspecified open wound, right lower leg, initial encounter: Secondary | ICD-10-CM | POA: Diagnosis not present

## 2017-09-19 DIAGNOSIS — M069 Rheumatoid arthritis, unspecified: Secondary | ICD-10-CM | POA: Diagnosis not present

## 2017-09-19 DIAGNOSIS — I872 Venous insufficiency (chronic) (peripheral): Secondary | ICD-10-CM | POA: Diagnosis not present

## 2017-09-19 NOTE — Telephone Encounter (Signed)
Daughter says mothers feet and legs are swelling. She is taking furosemide and it doesn't seem to be working. Daughter is wondering if mom can be changed to turosemide.  Please advise

## 2017-09-24 NOTE — Telephone Encounter (Signed)
Attempted to call patient & discuss, no answer. LVM asking patient to call back.  [name], MD

## 2017-09-25 NOTE — Telephone Encounter (Signed)
Lm on nurse line states that she was returning phone call. Melainie Krinsky, Salome Spotted, CMA

## 2017-09-26 DIAGNOSIS — I87313 Chronic venous hypertension (idiopathic) with ulcer of bilateral lower extremity: Secondary | ICD-10-CM | POA: Diagnosis not present

## 2017-09-26 DIAGNOSIS — I87333 Chronic venous hypertension (idiopathic) with ulcer and inflammation of bilateral lower extremity: Secondary | ICD-10-CM | POA: Diagnosis not present

## 2017-09-26 DIAGNOSIS — L97212 Non-pressure chronic ulcer of right calf with fat layer exposed: Secondary | ICD-10-CM | POA: Diagnosis not present

## 2017-09-26 DIAGNOSIS — M069 Rheumatoid arthritis, unspecified: Secondary | ICD-10-CM | POA: Diagnosis not present

## 2017-09-26 DIAGNOSIS — R609 Edema, unspecified: Secondary | ICD-10-CM | POA: Diagnosis not present

## 2017-09-26 DIAGNOSIS — L97222 Non-pressure chronic ulcer of left calf with fat layer exposed: Secondary | ICD-10-CM | POA: Diagnosis not present

## 2017-09-26 DIAGNOSIS — I10 Essential (primary) hypertension: Secondary | ICD-10-CM | POA: Diagnosis not present

## 2017-09-26 DIAGNOSIS — L97812 Non-pressure chronic ulcer of other part of right lower leg with fat layer exposed: Secondary | ICD-10-CM | POA: Diagnosis not present

## 2017-09-26 DIAGNOSIS — E119 Type 2 diabetes mellitus without complications: Secondary | ICD-10-CM | POA: Diagnosis not present

## 2017-09-26 DIAGNOSIS — L97822 Non-pressure chronic ulcer of other part of left lower leg with fat layer exposed: Secondary | ICD-10-CM | POA: Diagnosis not present

## 2017-09-26 DIAGNOSIS — L97821 Non-pressure chronic ulcer of other part of left lower leg limited to breakdown of skin: Secondary | ICD-10-CM | POA: Diagnosis not present

## 2017-10-03 DIAGNOSIS — I87333 Chronic venous hypertension (idiopathic) with ulcer and inflammation of bilateral lower extremity: Secondary | ICD-10-CM | POA: Diagnosis not present

## 2017-10-03 DIAGNOSIS — M069 Rheumatoid arthritis, unspecified: Secondary | ICD-10-CM | POA: Diagnosis not present

## 2017-10-03 DIAGNOSIS — L97822 Non-pressure chronic ulcer of other part of left lower leg with fat layer exposed: Secondary | ICD-10-CM | POA: Diagnosis not present

## 2017-10-03 DIAGNOSIS — E119 Type 2 diabetes mellitus without complications: Secondary | ICD-10-CM | POA: Diagnosis not present

## 2017-10-03 DIAGNOSIS — L97819 Non-pressure chronic ulcer of other part of right lower leg with unspecified severity: Secondary | ICD-10-CM | POA: Diagnosis not present

## 2017-10-03 DIAGNOSIS — I10 Essential (primary) hypertension: Secondary | ICD-10-CM | POA: Diagnosis not present

## 2017-10-03 DIAGNOSIS — L97219 Non-pressure chronic ulcer of right calf with unspecified severity: Secondary | ICD-10-CM | POA: Diagnosis not present

## 2017-10-03 DIAGNOSIS — L97212 Non-pressure chronic ulcer of right calf with fat layer exposed: Secondary | ICD-10-CM | POA: Diagnosis not present

## 2017-10-03 DIAGNOSIS — R609 Edema, unspecified: Secondary | ICD-10-CM | POA: Diagnosis not present

## 2017-10-03 DIAGNOSIS — L97812 Non-pressure chronic ulcer of other part of right lower leg with fat layer exposed: Secondary | ICD-10-CM | POA: Diagnosis not present

## 2017-10-03 DIAGNOSIS — I87312 Chronic venous hypertension (idiopathic) with ulcer of left lower extremity: Secondary | ICD-10-CM | POA: Diagnosis not present

## 2017-10-03 DIAGNOSIS — L97821 Non-pressure chronic ulcer of other part of left lower leg limited to breakdown of skin: Secondary | ICD-10-CM | POA: Diagnosis not present

## 2017-10-06 NOTE — Telephone Encounter (Signed)
Attempted again to reach patient. No answer Left another vm offering for her to call back [name], MD

## 2017-10-09 ENCOUNTER — Other Ambulatory Visit: Payer: Medicare Other | Admitting: Orthotics

## 2017-10-10 ENCOUNTER — Encounter: Payer: Self-pay | Admitting: Family Medicine

## 2017-10-10 DIAGNOSIS — L97212 Non-pressure chronic ulcer of right calf with fat layer exposed: Secondary | ICD-10-CM | POA: Diagnosis not present

## 2017-10-10 DIAGNOSIS — L97822 Non-pressure chronic ulcer of other part of left lower leg with fat layer exposed: Secondary | ICD-10-CM | POA: Diagnosis not present

## 2017-10-10 DIAGNOSIS — I87333 Chronic venous hypertension (idiopathic) with ulcer and inflammation of bilateral lower extremity: Secondary | ICD-10-CM | POA: Diagnosis not present

## 2017-10-10 DIAGNOSIS — I87312 Chronic venous hypertension (idiopathic) with ulcer of left lower extremity: Secondary | ICD-10-CM | POA: Diagnosis not present

## 2017-10-10 DIAGNOSIS — E119 Type 2 diabetes mellitus without complications: Secondary | ICD-10-CM | POA: Diagnosis not present

## 2017-10-10 DIAGNOSIS — L97829 Non-pressure chronic ulcer of other part of left lower leg with unspecified severity: Secondary | ICD-10-CM | POA: Diagnosis not present

## 2017-10-10 DIAGNOSIS — I10 Essential (primary) hypertension: Secondary | ICD-10-CM | POA: Diagnosis not present

## 2017-10-10 DIAGNOSIS — R609 Edema, unspecified: Secondary | ICD-10-CM | POA: Diagnosis not present

## 2017-10-10 DIAGNOSIS — L97812 Non-pressure chronic ulcer of other part of right lower leg with fat layer exposed: Secondary | ICD-10-CM | POA: Diagnosis not present

## 2017-10-10 DIAGNOSIS — M069 Rheumatoid arthritis, unspecified: Secondary | ICD-10-CM | POA: Diagnosis not present

## 2017-10-10 DIAGNOSIS — L97821 Non-pressure chronic ulcer of other part of left lower leg limited to breakdown of skin: Secondary | ICD-10-CM | POA: Diagnosis not present

## 2017-10-14 ENCOUNTER — Ambulatory Visit (INDEPENDENT_AMBULATORY_CARE_PROVIDER_SITE_OTHER): Payer: Medicare Other | Admitting: Orthotics

## 2017-10-14 DIAGNOSIS — E1159 Type 2 diabetes mellitus with other circulatory complications: Secondary | ICD-10-CM | POA: Diagnosis not present

## 2017-10-14 DIAGNOSIS — M2012 Hallux valgus (acquired), left foot: Secondary | ICD-10-CM | POA: Diagnosis not present

## 2017-10-14 DIAGNOSIS — M79609 Pain in unspecified limb: Secondary | ICD-10-CM | POA: Diagnosis not present

## 2017-10-14 DIAGNOSIS — B351 Tinea unguium: Secondary | ICD-10-CM

## 2017-10-14 NOTE — Progress Notes (Signed)

## 2017-11-04 DIAGNOSIS — I129 Hypertensive chronic kidney disease with stage 1 through stage 4 chronic kidney disease, or unspecified chronic kidney disease: Secondary | ICD-10-CM | POA: Diagnosis not present

## 2017-11-04 DIAGNOSIS — E1122 Type 2 diabetes mellitus with diabetic chronic kidney disease: Secondary | ICD-10-CM | POA: Diagnosis not present

## 2017-11-04 DIAGNOSIS — N183 Chronic kidney disease, stage 3 (moderate): Secondary | ICD-10-CM | POA: Diagnosis not present

## 2017-11-04 DIAGNOSIS — D509 Iron deficiency anemia, unspecified: Secondary | ICD-10-CM | POA: Diagnosis not present

## 2017-11-04 DIAGNOSIS — D631 Anemia in chronic kidney disease: Secondary | ICD-10-CM | POA: Diagnosis not present

## 2017-11-05 ENCOUNTER — Other Ambulatory Visit: Payer: Self-pay | Admitting: Family Medicine

## 2017-11-05 NOTE — Telephone Encounter (Signed)
Pt already scheduled for an appt. Shoni Quijas Kennon Holter, CMA

## 2017-11-05 NOTE — Telephone Encounter (Signed)
Please ask patient to schedule follow up visit with me. Thanks! [name], MD

## 2017-11-14 ENCOUNTER — Other Ambulatory Visit (HOSPITAL_COMMUNITY): Payer: Self-pay

## 2017-11-17 ENCOUNTER — Inpatient Hospital Stay (HOSPITAL_COMMUNITY): Admission: RE | Admit: 2017-11-17 | Payer: Medicare Other | Source: Ambulatory Visit

## 2017-11-18 ENCOUNTER — Ambulatory Visit (INDEPENDENT_AMBULATORY_CARE_PROVIDER_SITE_OTHER): Payer: Medicare Other | Admitting: Family Medicine

## 2017-11-18 ENCOUNTER — Encounter: Payer: Self-pay | Admitting: Family Medicine

## 2017-11-18 VITALS — BP 140/80 | HR 88 | Temp 97.5°F | Wt 142.0 lb

## 2017-11-18 DIAGNOSIS — E039 Hypothyroidism, unspecified: Secondary | ICD-10-CM

## 2017-11-18 DIAGNOSIS — Z794 Long term (current) use of insulin: Secondary | ICD-10-CM

## 2017-11-18 DIAGNOSIS — I10 Essential (primary) hypertension: Secondary | ICD-10-CM | POA: Diagnosis not present

## 2017-11-18 DIAGNOSIS — E1165 Type 2 diabetes mellitus with hyperglycemia: Secondary | ICD-10-CM

## 2017-11-18 DIAGNOSIS — E118 Type 2 diabetes mellitus with unspecified complications: Secondary | ICD-10-CM

## 2017-11-18 DIAGNOSIS — R413 Other amnesia: Secondary | ICD-10-CM | POA: Diagnosis not present

## 2017-11-18 DIAGNOSIS — IMO0002 Reserved for concepts with insufficient information to code with codable children: Secondary | ICD-10-CM

## 2017-11-18 DIAGNOSIS — D509 Iron deficiency anemia, unspecified: Secondary | ICD-10-CM

## 2017-11-18 DIAGNOSIS — D649 Anemia, unspecified: Secondary | ICD-10-CM

## 2017-11-18 LAB — POCT GLYCOSYLATED HEMOGLOBIN (HGB A1C): Hemoglobin A1C: 7.5

## 2017-11-18 MED ORDER — FERROUS SULFATE 325 (65 FE) MG PO TABS: 325.0000 mg | ORAL_TABLET | ORAL | 3 refills | Status: DC

## 2017-11-18 MED ORDER — BACLOFEN 1 MG/ML ORAL SUSPENSION: 2.5000 mg | Freq: Two times a day (BID) | ORAL | 0 refills | Status: DC | PRN

## 2017-11-18 NOTE — Progress Notes (Signed)
Date of Visit: 11/18/2017   HPI:  Patient presents for routine follow up. She is accompanied by her daughter.   Neck pain - would like rx for baclofen as she lost the last one. Also never got xrays. Neck still hurts sometimes.  Anemia - has been set up for iron infusions by nephrology. Needs oral iron refilled. Never got colonoscopy as recommended by GI, as she had to reschedule it.  Hypothyroidism - currently taking levothyroxine 89mcg daily. Due for TSH today.  Hyperlipidemia - taking crestor 20mg  daily. Tolerating this well. She is fasting today.  Diabetes - currently taking no diabetes medications. Due for A1c check today.  ROS: See HPI.  [place]: history of hypothyroidism, type 2 diabetes, hypertension, rheumatoid arthritis, CKD  PHYSICAL EXAM: BP 140/80 (BP Location: Right Arm, Patient Position: Sitting, Cuff Size: Normal)   Pulse 88   Temp (!) 97.5 F (36.4 C) (Oral)   Wt 142 lb (64.4 kg)   SpO2 100%   BMI 25.97 kg/m  Gen: no acute distress, pleasant, cooperative HEENT: normocephalic, atraumatic, moist mucous membranes. Full ROM of neck with flexion/extension/lateral flexion bilaterally Heart: regular rate and rhythm, no murmur Lungs: clear to auscultation bilaterally, normal work of breathing  Neuro: alert, grossly nonfocal, speech normal. Some difficulty recalling details of prior appointments Ext: No appreciable lower extremity edema bilaterally   ASSESSMENT/PLAN:  Hypothyroidism Check A1c today, titrate levothyroxine as needed based on results.  DM (diabetes mellitus), type 2, uncontrolled (HCC) A1c 7.5, reasonable control given her age. Not a candidate for metformin due to renal function and I am hesitant to put her on anything with possibility of hypoglycemia as side effect, as she seems to become increasingly overwhelmed/confused by her medical problems and medications. I am going to have her schedule in geriatrics clinic for a memory/cognitive assessment.  Recheck A1c in 3 months.   HYPERTENSION, BENIGN SYSTEMIC At goal, continue current regimen. Check CMET and lipids today.  Memory deficit Asked her to schedule in geriatrics clinic for cognitive assessment.  Iron deficiency anemia Refill PO iron today. Check CBC. Encouraged her to call GI to schedule colonoscopy, stressed importance of this.  Neck pain Seems more muscular etiology of pain. Refill baclofen (she never got this). Encouraged her to get xray done of neck.  FOLLOW UP: Follow up in geriatrics clinic for cognitive assessment, follow up with me after that visit.  Shelly. [name], [place]

## 2017-11-18 NOTE — Patient Instructions (Signed)
Go get xray of your neck Refilled baclofen - take prescription to your pharmacy Call the GI doctor to reschedule the endoscopy and colonoscopy Refilled your iron Checking labs today - thyroid, kidneys, liver, cholesterol, blood counts  Schedule an appointment in geriatrics clinic with Dr. McDiarmid. Follow up with me after that visit.  Be well, Dr. [name]

## 2017-11-19 LAB — CMP14+EGFR
ALBUMIN: 3.2 g/dL — AB (ref 3.5–4.8)
ALK PHOS: 91 IU/L (ref 39–117)
ALT: 7 IU/L (ref 0–32)
AST: 10 IU/L (ref 0–40)
Albumin/Globulin Ratio: 0.5 — ABNORMAL LOW (ref 1.2–2.2)
BUN / CREAT RATIO: 30 — AB (ref 12–28)
BUN: 39 mg/dL — ABNORMAL HIGH (ref 8–27)
Bilirubin Total: 0.3 mg/dL (ref 0.0–1.2)
CALCIUM: 9.3 mg/dL (ref 8.7–10.3)
CO2: 22 mmol/L (ref 20–29)
CREATININE: 1.29 mg/dL — AB (ref 0.57–1.00)
Chloride: 103 mmol/L (ref 96–106)
GFR calc Af Amer: 48 mL/min/{1.73_m2} — ABNORMAL LOW (ref >59)
GFR, EST NON AFRICAN AMERICAN: 41 mL/min/{1.73_m2} — AB (ref >59)
GLUCOSE: 108 mg/dL — AB (ref 65–99)
Globulin, Total: 6.6 g/dL — ABNORMAL HIGH (ref 1.5–4.5)
Potassium: 4.8 mmol/L (ref 3.5–5.2)
Sodium: 139 mmol/L (ref 134–144)
Total Protein: 9.8 g/dL — ABNORMAL HIGH (ref 6.0–8.5)

## 2017-11-19 LAB — TSH: TSH: 8.82 u[IU]/mL — ABNORMAL HIGH (ref 0.450–4.500)

## 2017-11-19 LAB — CBC
Hematocrit: 26.8 % — ABNORMAL LOW (ref 34.0–46.6)
Hemoglobin: 8.4 g/dL — ABNORMAL LOW (ref 11.1–15.9)
MCH: 26.5 pg — ABNORMAL LOW (ref 26.6–33.0)
MCHC: 31.3 g/dL — ABNORMAL LOW (ref 31.5–35.7)
MCV: 85 fL (ref 79–97)
PLATELETS: 407 10*3/uL — AB (ref 150–379)
RBC: 3.17 x10E6/uL — ABNORMAL LOW (ref 3.77–5.28)
RDW: 15.3 % (ref 12.3–15.4)
WBC: 5.4 10*3/uL (ref 3.4–10.8)

## 2017-11-19 LAB — LIPID PANEL
CHOLESTEROL TOTAL: 124 mg/dL (ref 100–199)
Chol/HDL Ratio: 2.6 ratio (ref 0.0–4.4)
HDL: 47 mg/dL (ref >39)
LDL CALC: 64 mg/dL (ref 0–99)
Triglycerides: 64 mg/dL (ref 0–149)
VLDL CHOLESTEROL CAL: 13 mg/dL (ref 5–40)

## 2017-11-20 ENCOUNTER — Ambulatory Visit
Admission: RE | Admit: 2017-11-20 | Discharge: 2017-11-20 | Disposition: A | Discharge status: Home / Self Care | Payer: Medicare Other | Source: Ambulatory Visit | Attending: Family Medicine | Admitting: Family Medicine

## 2017-11-20 DIAGNOSIS — M542 Cervicalgia: Secondary | ICD-10-CM

## 2017-11-21 ENCOUNTER — Encounter: Payer: Medicare Other | Attending: Nephrology | Admitting: Registered"

## 2017-11-21 DIAGNOSIS — I129 Hypertensive chronic kidney disease with stage 1 through stage 4 chronic kidney disease, or unspecified chronic kidney disease: Secondary | ICD-10-CM | POA: Diagnosis not present

## 2017-11-21 DIAGNOSIS — N183 Chronic kidney disease, stage 3 (moderate): Secondary | ICD-10-CM | POA: Diagnosis not present

## 2017-11-21 DIAGNOSIS — Z713 Dietary counseling and surveillance: Secondary | ICD-10-CM | POA: Insufficient documentation

## 2017-11-21 DIAGNOSIS — K219 Gastro-esophageal reflux disease without esophagitis: Secondary | ICD-10-CM | POA: Insufficient documentation

## 2017-11-21 DIAGNOSIS — E1122 Type 2 diabetes mellitus with diabetic chronic kidney disease: Secondary | ICD-10-CM | POA: Insufficient documentation

## 2017-11-21 DIAGNOSIS — E1165 Type 2 diabetes mellitus with hyperglycemia: Secondary | ICD-10-CM

## 2017-11-21 NOTE — Patient Instructions (Signed)
   Ask your doctor for OneTouch Verio Flex supplies so you can check your blood sugar. Check 1 time per day before you eat breakfast and your goal is to have your sugar between 80-130 mg/dL  Look into [employer] for more convenient transportation.  Consider having more vegetables, look into frozen food options.  Continue having [place] Yogurt, it has good bacteria  Consider having diet soda instead of regular soda or sparkling water.  Consider getting a Britta Filter to drink more water
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
## 2017-11-23 NOTE — Assessment & Plan Note (Signed)
A1c 7.5, reasonable control given her age. Not a candidate for metformin due to renal function and I am hesitant to put her on anything with possibility of hypoglycemia as side effect, as she seems to become increasingly overwhelmed/confused by her medical problems and medications. I am going to have her schedule in geriatrics clinic for a memory/cognitive assessment. Recheck A1c in 3 months.

## 2017-11-23 NOTE — Assessment & Plan Note (Signed)
Check A1c today, titrate levothyroxine as needed based on results.

## 2017-11-23 NOTE — Assessment & Plan Note (Addendum)
At goal, continue current regimen. Check CMET and lipids today.

## 2017-11-23 NOTE — Assessment & Plan Note (Signed)
Refill PO iron today. Check CBC. Encouraged her to call GI to schedule colonoscopy, stressed importance of this.

## 2017-11-23 NOTE — Assessment & Plan Note (Signed)
Asked her to schedule in geriatrics clinic for cognitive assessment.

## 2017-11-24 ENCOUNTER — Encounter (HOSPITAL_COMMUNITY): Payer: Medicare Other

## 2017-11-24 ENCOUNTER — Encounter: Payer: Self-pay | Admitting: Registered"

## 2017-11-26 ENCOUNTER — Ambulatory Visit (HOSPITAL_COMMUNITY)
Admission: RE | Admit: 2017-11-26 | Discharge: 2017-11-26 | Disposition: A | Discharge status: Home / Self Care | Payer: Medicare Other | Source: Ambulatory Visit | Attending: Nephrology | Admitting: Nephrology

## 2017-11-26 DIAGNOSIS — D631 Anemia in chronic kidney disease: Secondary | ICD-10-CM | POA: Diagnosis not present

## 2017-11-26 MED ORDER — SODIUM CHLORIDE 0.9 % IV SOLN
510.0000 mg | INTRAVENOUS | Status: DC
  Administered 2017-11-26: 510 mg via INTRAVENOUS
  Filled 2017-11-26: qty 17

## 2017-11-26 NOTE — Progress Notes (Signed)
Pt stated at then end of her infusion that she was itching all over.  I called [facility] and there orders were to have the patient take a benadyl when she got home.  The Dr will call the patient at home to tell her if she needs to come in next week for her second dose

## 2017-12-01 DIAGNOSIS — I129 Hypertensive chronic kidney disease with stage 1 through stage 4 chronic kidney disease, or unspecified chronic kidney disease: Secondary | ICD-10-CM | POA: Diagnosis not present

## 2017-12-01 DIAGNOSIS — N183 Chronic kidney disease, stage 3 (moderate): Secondary | ICD-10-CM | POA: Diagnosis not present

## 2017-12-03 ENCOUNTER — Encounter (HOSPITAL_COMMUNITY): Payer: Medicare Other

## 2017-12-05 ENCOUNTER — Telehealth: Payer: Self-pay | Admitting: Family Medicine

## 2017-12-05 NOTE — Telephone Encounter (Signed)
Attempted to reach patient to discuss lab results. No answer. LVM asking patient to call back.  [name], MD

## 2017-12-08 NOTE — Telephone Encounter (Signed)
Patient left message returning PCP's call.   Callback is [phone].  Danley Danker, RN [facility] RN)

## 2017-12-09 NOTE — Telephone Encounter (Signed)
Pt returned dr Lennie Odor call

## 2017-12-10 MED ORDER — LEVOTHYROXINE SODIUM 100 MCG PO TABS: 75.0000 ug | ORAL_TABLET | Freq: Every day | ORAL | 0 refills | Status: DC

## 2017-12-10 NOTE — Telephone Encounter (Signed)
Called and spoke with patient. Advised TSH up and we need to increase synthroid. Will send in rx. Advised of need to repeat TSH in 6 weeks.  Other labs largely unchanged. Neck xray with degenerative/arthritic changes but otherwise no major findings.  Patient appreciative.  [name], MD

## 2017-12-19 ENCOUNTER — Ambulatory Visit: Payer: Medicare Other | Admitting: Podiatry

## 2017-12-19 ENCOUNTER — Encounter: Payer: Self-pay | Admitting: Podiatry

## 2017-12-19 ENCOUNTER — Encounter: Payer: Medicare Other | Attending: Nephrology | Admitting: Registered"

## 2017-12-19 DIAGNOSIS — B351 Tinea unguium: Secondary | ICD-10-CM | POA: Diagnosis not present

## 2017-12-19 DIAGNOSIS — I129 Hypertensive chronic kidney disease with stage 1 through stage 4 chronic kidney disease, or unspecified chronic kidney disease: Secondary | ICD-10-CM | POA: Insufficient documentation

## 2017-12-19 DIAGNOSIS — K219 Gastro-esophageal reflux disease without esophagitis: Secondary | ICD-10-CM | POA: Diagnosis not present

## 2017-12-19 DIAGNOSIS — E1159 Type 2 diabetes mellitus with other circulatory complications: Secondary | ICD-10-CM | POA: Diagnosis not present

## 2017-12-19 DIAGNOSIS — Z713 Dietary counseling and surveillance: Secondary | ICD-10-CM | POA: Insufficient documentation

## 2017-12-19 DIAGNOSIS — M79609 Pain in unspecified limb: Secondary | ICD-10-CM

## 2017-12-19 DIAGNOSIS — E1165 Type 2 diabetes mellitus with hyperglycemia: Secondary | ICD-10-CM

## 2017-12-19 DIAGNOSIS — E1122 Type 2 diabetes mellitus with diabetic chronic kidney disease: Secondary | ICD-10-CM | POA: Diagnosis not present

## 2017-12-19 DIAGNOSIS — N183 Chronic kidney disease, stage 3 (moderate): Secondary | ICD-10-CM | POA: Insufficient documentation

## 2017-12-19 LAB — HM DIABETES FOOT EXAM

## 2017-12-19 NOTE — Progress Notes (Signed)
Patient ID: [name], female   DOB: 1945-12-03, 72 y.o.   MRN: [id number] Complaint:  Visit Type: Patient presents  to my office for  preventative foot care services. Complaint: Patient states" my nails have grown long and thick and become painful to walk and wear shoes" Patient has been diagnosed with DM with neuropathy.. The patient presents for preventative foot care services. No changes to ROS.  No evidence of blisters/ulcers.  Podiatric Exam: Vascular: dorsalis pedis and posterior tibial pulses are not  palpable bilateral due to swollen feet/legs. Capillary return is immediate. Temperature gradient is WNL.   Sensorium: Normal Semmes Weinstein monofilament test. Normal tactile sensation bilaterally. Nail Exam: Pt has thick disfigured discolored nails with subungual debris noted bilateral entire nail hallux through second toes both feet.; Ulcer Exam: There is no evidence of ulcer or pre-ulcerative changes or infection. Orthopedic Exam: Muscle tone and strength are WNL. No limitations in general ROM. No crepitus or effusions noted. Arthritis  IPJ right hallux.  HAV 1st MPJ  Left foot. Skin: No Porokeratosis. No infection or ulcers,      Diagnosis:  Onychomycosis, , Pain in right toe, pain in left toes,  Diabetes with angiopathy and neurology.  Treatment & Plan Procedures and Treatment: Consent by patient was obtained for treatment procedures. The patient understood the discussion of treatment and procedures well. All questions were answered thoroughly reviewed. Debridement of mycotic and hypertrophic toenails, 1 through 5 bilateral and clearing of subungual debris. No ulceration, no infection noted.  Patient has developed a painful skin lesion in the arch of the left foot.  She was told to return to [facility] for evaluation.    Return Visit-Office Procedure: Patient instructed to return to the office for a follow up visit 4 months for continued evaluation and treatment.   Gardiner Barefoot DPM

## 2017-12-19 NOTE — Progress Notes (Signed)
Diabetes Self-Management Education  Visit Type: Follow-up  Appt. Start Time: 0830 Appt. End Time: 0900  12/19/2017  [name], identified by name and date of birth, is a 72 y.o. female with a diagnosis of Diabetes: Type 2.   ASSESSMENT This patient is accompanied in the office by her child. Patient's daughter tried to get the glucometer's app on her phone after the last appointment but could not make is work. During appointment patient gave permission for her daughter to install the app on the patient's phone and successfully got to the point where it was ready to pair with the meter. Patient states she left her meter at home, but after she gets her app synced with the meter she will not need to bring the meter to her appointments.   Patient states she was still in denial after her last appointment so she was not checking her blood sugar until yesterday she checked after eating some trail mix and it was 240 mg/dL. Patient agrees to start doing daily FBG.   Patient appeared quiet excited when she understood the meaning of balanced eating and verbalized examples of what that meant. Pt seems to still be struggling with the concept of starchy vs non-starchy vegetables, but appeared to understand how to use my plate and that potatoes and corn are in the starchy category.  Patient described her thought process when deciding what to cook for dinner yesterday; she had hamburger and was going to cook it with rice, but then decided she really enjoyed the broccoli mix she found at the dollar store and had that instead and really loved it.  Diabetes Self-Management Education - 12/19/17 0847      Visit Information   Visit Type  Follow-up      Initial Visit   Diabetes Type  Type 2      Complications   How often do you check your blood sugar?  3-4 times / week      Dietary Intake   Breakfast  PB crackers    Snack (morning)  trailmix    Lunch  bbq chicken wings    Snack (afternoon)  fruit    Dinner  hamburger, broccoli    Snack (evening)  none    Beverage(s)  water with sugar-free orange tang packet      Individualized Goals (developed by patient)   Nutrition  General guidelines for healthy choices and portions discussed    Monitoring   test my blood glucose as discussed      Outcomes   Expected Outcomes  Demonstrated interest in learning. Expect positive outcomes    Future DMSE  4-6 wks    Program Status  Not Completed      Subsequent Visit   Since your last visit have you continued or begun to take your medications as prescribed?  Yes    Since your last visit have you had your blood pressure checked?  No    Since your last visit have you experienced any weight changes?  No change    Since your last visit, are you checking your blood glucose at least once a day?  No     Individualized Plan for Diabetes Self-Management Training:   Learning Objective:  Patient will have a greater understanding of diabetes self-management. Patient education plan is to attend individual and/or group sessions per assessed needs and concerns.   Patient Instructions  Check in the morning before you eat Continue to eat more vegetables Aim to eat balanced  meals and snacks Whenever you eat starch or fruit, also eat a protein  Expected Outcomes:  Demonstrated interest in learning. Expect positive outcomes  Education material provided: My Plate and Snack sheet  If problems or questions, patient to contact team via:  Phone and MyChart  Future DSME appointment: 4-6 wks

## 2017-12-19 NOTE — Patient Instructions (Addendum)
Check in the morning before you eat Continue to eat more vegetables Aim to eat balanced meals and snacks Whenever you eat starch or fruit, also eat a protein

## 2017-12-29 ENCOUNTER — Other Ambulatory Visit: Payer: Self-pay

## 2017-12-29 NOTE — Telephone Encounter (Signed)
Patient left message asking for refill for [facility] Flex strips.  Call back [phone].  Danley Danker, RN [facility] RN)

## 2018-01-01 ENCOUNTER — Ambulatory Visit: Payer: Medicare Other | Admitting: Orthotics

## 2018-01-01 DIAGNOSIS — L97521 Non-pressure chronic ulcer of other part of left foot limited to breakdown of skin: Secondary | ICD-10-CM

## 2018-01-01 DIAGNOSIS — E1159 Type 2 diabetes mellitus with other circulatory complications: Secondary | ICD-10-CM

## 2018-01-01 NOTE — Progress Notes (Signed)
Offload callus area that has developed mid arch R

## 2018-01-02 ENCOUNTER — Other Ambulatory Visit: Payer: Self-pay

## 2018-01-02 DIAGNOSIS — IMO0001 Reserved for inherently not codable concepts without codable children: Secondary | ICD-10-CM

## 2018-01-02 DIAGNOSIS — E119 Type 2 diabetes mellitus without complications: Principal | ICD-10-CM

## 2018-01-02 DIAGNOSIS — Z794 Long term (current) use of insulin: Principal | ICD-10-CM

## 2018-01-02 MED ORDER — GLUCOSE BLOOD VI STRP: ORAL_STRIP | 3 refills | Status: DC

## 2018-01-05 DIAGNOSIS — N183 Chronic kidney disease, stage 3 (moderate): Secondary | ICD-10-CM | POA: Diagnosis not present

## 2018-01-05 DIAGNOSIS — I129 Hypertensive chronic kidney disease with stage 1 through stage 4 chronic kidney disease, or unspecified chronic kidney disease: Secondary | ICD-10-CM | POA: Diagnosis not present

## 2018-01-05 DIAGNOSIS — D631 Anemia in chronic kidney disease: Secondary | ICD-10-CM | POA: Diagnosis not present

## 2018-01-05 DIAGNOSIS — E1122 Type 2 diabetes mellitus with diabetic chronic kidney disease: Secondary | ICD-10-CM | POA: Diagnosis not present

## 2018-01-05 DIAGNOSIS — N2581 Secondary hyperparathyroidism of renal origin: Secondary | ICD-10-CM | POA: Diagnosis not present

## 2018-01-05 DIAGNOSIS — R809 Proteinuria, unspecified: Secondary | ICD-10-CM | POA: Diagnosis not present

## 2018-01-05 MED ORDER — ACCU-CHEK SOFTCLIX LANCETS MISC: 11 refills | Status: DC

## 2018-01-08 ENCOUNTER — Ambulatory Visit: Payer: Medicare Other | Admitting: Orthotics

## 2018-01-08 DIAGNOSIS — E1159 Type 2 diabetes mellitus with other circulatory complications: Secondary | ICD-10-CM

## 2018-01-08 DIAGNOSIS — S91302A Unspecified open wound, left foot, initial encounter: Secondary | ICD-10-CM

## 2018-01-08 DIAGNOSIS — L97521 Non-pressure chronic ulcer of other part of left foot limited to breakdown of skin: Secondary | ICD-10-CM

## 2018-01-08 DIAGNOSIS — M2012 Hallux valgus (acquired), left foot: Secondary | ICD-10-CM

## 2018-01-08 NOTE — Progress Notes (Signed)
Examined painful skin lesion left arch; offloads seem to be helping but I advised patient to schedule follow up appointment with Dr. Prudence Davidson so he make sure we are going in right direction.  Patient has hx of ulceration and has been treated w wound care before.

## 2018-01-16 ENCOUNTER — Encounter: Payer: Self-pay | Admitting: Registered"

## 2018-01-16 ENCOUNTER — Encounter: Payer: Self-pay | Admitting: Podiatry

## 2018-01-16 ENCOUNTER — Ambulatory Visit (INDEPENDENT_AMBULATORY_CARE_PROVIDER_SITE_OTHER): Payer: Medicare Other | Admitting: Podiatry

## 2018-01-16 ENCOUNTER — Encounter: Payer: Medicare Other | Admitting: Registered"

## 2018-01-16 DIAGNOSIS — K219 Gastro-esophageal reflux disease without esophagitis: Secondary | ICD-10-CM | POA: Diagnosis not present

## 2018-01-16 DIAGNOSIS — E1165 Type 2 diabetes mellitus with hyperglycemia: Secondary | ICD-10-CM

## 2018-01-16 DIAGNOSIS — Z713 Dietary counseling and surveillance: Secondary | ICD-10-CM | POA: Diagnosis not present

## 2018-01-16 DIAGNOSIS — I129 Hypertensive chronic kidney disease with stage 1 through stage 4 chronic kidney disease, or unspecified chronic kidney disease: Secondary | ICD-10-CM | POA: Diagnosis not present

## 2018-01-16 DIAGNOSIS — Z872 Personal history of diseases of the skin and subcutaneous tissue: Secondary | ICD-10-CM

## 2018-01-16 DIAGNOSIS — E1159 Type 2 diabetes mellitus with other circulatory complications: Secondary | ICD-10-CM

## 2018-01-16 DIAGNOSIS — E1122 Type 2 diabetes mellitus with diabetic chronic kidney disease: Secondary | ICD-10-CM | POA: Diagnosis not present

## 2018-01-16 DIAGNOSIS — N183 Chronic kidney disease, stage 3 (moderate): Secondary | ICD-10-CM | POA: Diagnosis not present

## 2018-01-16 NOTE — Patient Instructions (Addendum)
Consider using Pam spray when cooking eggs instead of in the bacon grease Continue eating 3 balanced meals Can use snack sheet for ideas If you start losing weight (not just water weight), start including snacks Continue stay active Ask Deanna about the prune juice if it is okay for your kidneys

## 2018-01-16 NOTE — Progress Notes (Signed)
Diabetes Self-Management Education  Visit Type: Follow-up  Appt. Start Time: 0830 Appt. End Time: 0900  01/16/2018  [name], identified by name and date of birth, is a 72 y.o. female with a diagnosis of Diabetes:  .   This patient is accompanied in the office by her daughter.  ASSESSMENT Pt states she dropped her meter in her nephews car and has them looking for it. Pt states it doesn't go overr 179 and states that her doctor told her as long as it stays below 180 she is good. Pt states she used to feel terrible when BG was below 200. Pt states she is happy it is not in the 300s anymore.  Pt states she has less swelling in leg. Patient states she walks to catch the bus and generally stays active. Pt states her neighbor is gone because he went for a walk and family couldn't find him so they called the cops. Pt seems concerned because she knew he did not want to move into assisted living.  Pt had questions about cooking eggs in bacon grease, appropriate meal size, and specifically asked about salmon croquette if a balanced food item.   Diabetes Self-Management Education - 01/16/18 0800      Visit Information   Visit Type  Follow-up      Psychosocial Assessment   Patient Belief/Attitude about Diabetes  Motivated to manage diabetes      Complications   Postprandial Blood glucose range (mg/dL)  -- never goes above 179 mg/dL    Number of hypoglycemic episodes per month  0    Number of hyperglycemic episodes per week  0      Dietary Intake   Breakfast  prune juice, cereal, milk, ww toast, tea    Snack (morning)  none    Lunch  roast beef, carrots, rice, orange, coffee    Snack (afternoon)  none    Dinner  [employer], cottage cheese, tomato , lettuce, ww toast, fruit salad    Snack (evening)  catelope, milk    Beverage(s)  water, tea, prune juice      Exercise   Exercise Type  ADL's      Patient Education   Nutrition management   Meal options for control of blood  glucose level and chronic complications.    Physical activity and exercise   Other (comment) stay active      Individualized Goals (developed by patient)   Nutrition  General guidelines for healthy choices and portions discussed    Medications  take my medication as prescribed    Monitoring   test my blood glucose as discussed      Patient Self-Evaluation of Goals - Patient rates self as meeting previously set goals (% of time)   Nutrition  >75%    Monitoring  >75%      Outcomes   Expected Outcomes  Demonstrated interest in learning. Expect positive outcomes    Future DMSE  4-6 wks    Program Status  Completed      Subsequent Visit   Since your last visit have you continued or begun to take your medications as prescribed?  Yes    Since your last visit have you had your blood pressure checked?  Yes    Is your most recent blood pressure lower, unchanged, or higher since your last visit?  Lower    Since your last visit have you experienced any weight changes?  No change weight fluctuations likely due to fluid,  less swelling now    Since your last visit, are you checking your blood glucose at least once a day?  Yes     Individualized Plan for Diabetes Self-Management Training:   Learning Objective:  Patient will have a greater understanding of diabetes self-management. Patient education plan is to attend individual and/or group sessions per assessed needs and concerns.   Patient Instructions  Consider using Pam spray when cooking eggs instead of in the bacon grease Continue eating 3 balanced meals Can use snack sheet for ideas If you start losing weight (not just water weight), start including snacks Continue stay active Ask Deanna about the prune juice if it is okay for your kidneys  Expected Outcomes:  Demonstrated interest in learning. Expect positive outcomes  Education material provided: Snack sheet  If problems or questions, patient to contact team via:  Phone and  MyChart  Future DSME appointment: 4-6 wks

## 2018-01-16 NOTE — Progress Notes (Signed)
This patient  presents to this office for an evaluation of a painful skin lesion in the center of her left arch.  She developed this painful area approximately 4 weeks ago and was referred to [facility] for  dispersion padding.He added dispersion padding to her diabetic insole and  the area has developed a callus.  She says she still has pain, but she has purchased a new pair of shoes with soft insoles, which help more than the dispersion pad made by [employer].  She presents to the office for an evaluation of these soft insoles and and an evaluation of the dispersion pad made by [employer].    General Appearance  Alert, conversant and in no acute stress.  Vascular  Dorsalis pedis and posterior tibial  pulses are not  palpable  Bilaterally due to swelling..  Capillary return is within normal limits  bilaterally. Temperature is within normal limits  bilaterally.  Neurologic  Senn-Weinstein monofilament wire test within normal limits  bilaterally. Muscle power within normal limits bilaterally.  Nails Thick disfigured discolored nails with subungual debris  from hallux to fifth toes bilaterally. No evidence of bacterial infection or drainage bilaterally.  Orthopedic  No limitations of motion of motion feet .  No crepitus or effusions noted.  No bony pathology or digital deformities noted.  Arthritis IPJ right hallux.  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  Arthritis 1st MCJ left foot.  Skin  normotropic skin with no porokeratosis noted bilaterally.  No signs of infections or ulcers noted.  Callus left arch left foot.  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  ROV.  This patient was referred back to me by Liliane Channel to make sure we were going in the right direction in treatment with this patient.  Patient has developed a callus under the first MPJ of the left foot. due to arthritis in that joint..  I examined the dispersion padding made by Liliane Channel and found it to be acceptable.  I also examined the soft orthotic that this patient brought to  the office  and found it to be acceptable.  Since there is no open wounds or drainage. I told her that the soft orthoses  Are acceptable to wear.  I even prescribed spenco orthoses for her to wear.   This patient kept referring to the ulcer that she previously had developed in her left foot.  She had developed this ulcer under her big toe and was treated by Dr. Paulla Dolly and Dr. Valentina Lucks.  I told this patient to use the soft orthoses when she walks and to return to the office for preventative foot care services in 3 months.   Gardiner Barefoot DPM

## 2018-01-27 DIAGNOSIS — H25011 Cortical age-related cataract, right eye: Secondary | ICD-10-CM | POA: Diagnosis not present

## 2018-01-27 DIAGNOSIS — H25041 Posterior subcapsular polar age-related cataract, right eye: Secondary | ICD-10-CM | POA: Diagnosis not present

## 2018-01-27 DIAGNOSIS — H524 Presbyopia: Secondary | ICD-10-CM | POA: Diagnosis not present

## 2018-01-27 DIAGNOSIS — E113593 Type 2 diabetes mellitus with proliferative diabetic retinopathy without macular edema, bilateral: Secondary | ICD-10-CM | POA: Diagnosis not present

## 2018-01-27 LAB — HM DIABETES EYE EXAM

## 2018-01-30 ENCOUNTER — Telehealth: Payer: Self-pay | Admitting: Family Medicine

## 2018-01-30 DIAGNOSIS — E039 Hypothyroidism, unspecified: Secondary | ICD-10-CM

## 2018-01-30 NOTE — Telephone Encounter (Signed)
Received form for clearance of cataract surgery, to be done with topical anesthesia and mild sedation. Guidelines recommend against routine testing before cataract surgery. Form completed for clearance, will fax back to [facility].  [name], MD

## 2018-01-30 NOTE — Telephone Encounter (Signed)
Pt informed and appts made for Lab and Geri. Isaack Preble, Salome Spotted, CMA

## 2018-01-30 NOTE — Telephone Encounter (Signed)
Red team,  Please remind patient to schedule an appointment in geriatrics clinic and also a lab visit for recheck of her TSH. I have entered the order.   Will not refill 71mcg tabs as we increased to 175mcg tablets in April and I sent in 90 pills then so she should have plenty.  Thanks [name], MD

## 2018-02-02 ENCOUNTER — Other Ambulatory Visit: Payer: Medicare Other

## 2018-02-02 DIAGNOSIS — E039 Hypothyroidism, unspecified: Secondary | ICD-10-CM

## 2018-02-03 ENCOUNTER — Ambulatory Visit (HOSPITAL_COMMUNITY)
Admission: RE | Admit: 2018-02-03 | Discharge: 2018-02-03 | Disposition: A | Discharge status: Home / Self Care | Payer: Medicare Other | Source: Ambulatory Visit | Attending: Nephrology | Admitting: Nephrology

## 2018-02-03 VITALS — BP 147/70 | HR 68 | Temp 98.6°F | Resp 20 | Ht 62.0 in | Wt 145.0 lb

## 2018-02-03 DIAGNOSIS — D508 Other iron deficiency anemias: Secondary | ICD-10-CM

## 2018-02-03 LAB — POCT HEMOGLOBIN-HEMACUE: HEMOGLOBIN: 9.1 g/dL — AB (ref 12.0–15.0)

## 2018-02-03 LAB — TSH: TSH: 6.79 u[IU]/mL — AB (ref 0.450–4.500)

## 2018-02-03 MED ORDER — SODIUM CHLORIDE 0.9 % IV SOLN
300.0000 mg | Freq: Once | INTRAVENOUS | Status: AC
  Administered 2018-02-03: 300 mg via INTRAVENOUS
  Filled 2018-02-03: qty 15

## 2018-02-03 MED ORDER — EPOETIN ALFA-EPBX 10000 UNIT/ML IJ SOLN
20000.0000 [IU] | INTRAMUSCULAR | Status: DC
  Administered 2018-02-03: 20000 [IU] via SUBCUTANEOUS
  Filled 2018-02-03: qty 2

## 2018-02-03 NOTE — Discharge Instructions (Signed)
Epoetin Alfa injection °What is this medicine? °EPOETIN ALFA (e POE e tin AL fa) helps your body make more red blood cells. This medicine is used to treat anemia caused by chronic kidney failure, cancer chemotherapy, or HIV-therapy. It may also be used before surgery if you have anemia. °This medicine may be used for other purposes; ask your health care provider or pharmacist if you have questions. °COMMON BRAND NAME(S): Epogen, Procrit °What should I tell my health care provider before I take this medicine? °They need to know if you have any of these conditions: °-blood clotting disorders °-cancer patient not on chemotherapy °-cystic fibrosis °-heart disease, such as angina or heart failure °-hemoglobin level of 12 g/dL or greater °-high blood pressure °-low levels of folate, iron, or vitamin B12 °-seizures °-an unusual or allergic reaction to erythropoietin, albumin, benzyl alcohol, hamster proteins, other medicines, foods, dyes, or preservatives °-pregnant or trying to get pregnant °-breast-feeding °How should I use this medicine? °This medicine is for injection into a vein or under the skin. It is usually given by a health care professional in a hospital or clinic setting. °If you get this medicine at home, you will be taught how to prepare and give this medicine. Use exactly as directed. Take your medicine at regular intervals. Do not take your medicine more often than directed. °It is important that you put your used needles and syringes in a special sharps container. Do not put them in a trash can. If you do not have a sharps container, call your pharmacist or healthcare provider to get one. °A special MedGuide will be given to you by the pharmacist with each prescription and refill. Be sure to read this information carefully each time. °Talk to your pediatrician regarding the use of this medicine in children. While this drug may be prescribed for selected conditions, precautions do apply. °Overdosage: If you  think you have taken too much of this medicine contact a poison control center or emergency room at once. °NOTE: This medicine is only for you. Do not share this medicine with others. °What if I miss a dose? °If you miss a dose, take it as soon as you can. If it is almost time for your next dose, take only that dose. Do not take double or extra doses. °What may interact with this medicine? °Do not take this medicine with any of the following medications: °-darbepoetin alfa °This list may not describe all possible interactions. Give your health care provider a list of all the medicines, herbs, non-prescription drugs, or dietary supplements you use. Also tell them if you smoke, drink alcohol, or use illegal drugs. Some items may interact with your medicine. °What should I watch for while using this medicine? °Your condition will be monitored carefully while you are receiving this medicine. °You may need blood work done while you are taking this medicine. °What side effects may I notice from receiving this medicine? °Side effects that you should report to your doctor or health care professional as soon as possible: °-allergic reactions like skin rash, itching or hives, swelling of the face, lips, or tongue °-breathing problems °-changes in vision °-chest pain °-confusion, trouble speaking or understanding °-feeling faint or lightheaded, falls °-high blood pressure °-muscle aches or pains °-pain, swelling, warmth in the leg °-rapid weight gain °-severe headaches °-sudden numbness or weakness of the face, arm or leg °-trouble walking, dizziness, loss of balance or coordination °-seizures (convulsions) °-swelling of the ankles, feet, hands °-unusually weak or tired °  Side effects that usually do not require medical attention (report to your doctor or health care professional if they continue or are bothersome): -diarrhea -fever, chills (flu-like symptoms) -headaches -nausea, vomiting -redness, stinging, or swelling at  site where injected This list may not describe all possible side effects. Call your doctor for medical advice about side effects. You may report side effects to FDA at 1-800-FDA-1088. Where should I keep my medicine? Keep out of the reach of children. Store in a refrigerator between 2 and 8 degrees C (36 and 46 degrees F). Do not freeze or shake. Throw away any unused portion if using a single-dose vial. Multi-dose vials can be kept in the refrigerator for up to 21 days after the initial dose. Throw away unused medicine. NOTE: This sheet is a summary. It may not cover all possible information. If you have questions about this medicine, talk to your doctor, pharmacist, or health care provider.  2018 Elsevier/Gold Standard (2016-03-25 19:42:31)Iron Sucrose injection What is this medicine? IRON SUCROSE (AHY ern SOO krohs) is an iron complex. Iron is used to make healthy red blood cells, which carry oxygen and nutrients throughout the body. This medicine is used to treat iron deficiency anemia in people with chronic kidney disease. This medicine may be used for other purposes; ask your health care provider or pharmacist if you have questions. COMMON BRAND NAME(S): Venofer What should I tell my health care provider before I take this medicine? They need to know if you have any of these conditions: -anemia not caused by low iron levels -heart disease -high levels of iron in the blood -kidney disease -liver disease -an unusual or allergic reaction to iron, other medicines, foods, dyes, or preservatives -pregnant or trying to get pregnant -breast-feeding How should I use this medicine? This medicine is for infusion into a vein. It is given by a health care professional in a hospital or clinic setting. Talk to your pediatrician regarding the use of this medicine in children. While this drug may be prescribed for children as young as 2 years for selected conditions, precautions do apply. Overdosage:  If you think you have taken too much of this medicine contact a poison control center or emergency room at once. NOTE: This medicine is only for you. Do not share this medicine with others. What if I miss a dose? It is important not to miss your dose. Call your doctor or health care professional if you are unable to keep an appointment. What may interact with this medicine? Do not take this medicine with any of the following medications: -deferoxamine -dimercaprol -other iron products This medicine may also interact with the following medications: -chloramphenicol -deferasirox This list may not describe all possible interactions. Give your health care provider a list of all the medicines, herbs, non-prescription drugs, or dietary supplements you use. Also tell them if you smoke, drink alcohol, or use illegal drugs. Some items may interact with your medicine. What should I watch for while using this medicine? Visit your doctor or healthcare professional regularly. Tell your doctor or healthcare professional if your symptoms do not start to get better or if they get worse. You may need blood work done while you are taking this medicine. You may need to follow a special diet. Talk to your doctor. Foods that contain iron include: whole grains/cereals, dried fruits, beans, or peas, leafy green vegetables, and organ meats (liver, kidney). What side effects may I notice from receiving this medicine? Side effects that you should  report to your doctor or health care professional as soon as possible: -allergic reactions like skin rash, itching or hives, swelling of the face, lips, or tongue -breathing problems -changes in blood pressure -cough -fast, irregular heartbeat -feeling faint or lightheaded, falls -fever or chills -flushing, sweating, or hot feelings -joint or muscle aches/pains -seizures -swelling of the ankles or feet -unusually weak or tired Side effects that usually do not require  medical attention (report to your doctor or health care professional if they continue or are bothersome): -diarrhea -feeling achy -headache -irritation at site where injected -nausea, vomiting -stomach upset -tiredness This list may not describe all possible side effects. Call your doctor for medical advice about side effects. You may report side effects to FDA at 1-800-FDA-1088. Where should I keep my medicine? This drug is given in a hospital or clinic and will not be stored at home. NOTE: This sheet is a summary. It may not cover all possible information. If you have questions about this medicine, talk to your doctor, pharmacist, or health care provider.  2018 Elsevier/Gold Standard (2011-05-16 17:14:35)

## 2018-02-04 ENCOUNTER — Encounter: Payer: Self-pay | Admitting: Family Medicine

## 2018-02-10 DIAGNOSIS — H35052 Retinal neovascularization, unspecified, left eye: Secondary | ICD-10-CM | POA: Diagnosis not present

## 2018-02-10 DIAGNOSIS — E113593 Type 2 diabetes mellitus with proliferative diabetic retinopathy without macular edema, bilateral: Secondary | ICD-10-CM | POA: Diagnosis not present

## 2018-02-10 LAB — HM DIABETES EYE EXAM

## 2018-02-12 DIAGNOSIS — H353221 Exudative age-related macular degeneration, left eye, with active choroidal neovascularization: Secondary | ICD-10-CM | POA: Diagnosis not present

## 2018-02-17 ENCOUNTER — Other Ambulatory Visit: Payer: Self-pay

## 2018-02-17 ENCOUNTER — Ambulatory Visit (INDEPENDENT_AMBULATORY_CARE_PROVIDER_SITE_OTHER): Payer: Medicare Other | Admitting: Family Medicine

## 2018-02-17 ENCOUNTER — Encounter: Payer: Self-pay | Admitting: Family Medicine

## 2018-02-17 ENCOUNTER — Ambulatory Visit
Admission: RE | Admit: 2018-02-17 | Discharge: 2018-02-17 | Disposition: A | Discharge status: Home / Self Care | Payer: Medicare Other | Source: Ambulatory Visit | Attending: Family Medicine | Admitting: Family Medicine

## 2018-02-17 VITALS — BP 132/68 | HR 79 | Temp 98.5°F | Ht 62.0 in | Wt 134.8 lb

## 2018-02-17 DIAGNOSIS — Z23 Encounter for immunization: Secondary | ICD-10-CM

## 2018-02-17 DIAGNOSIS — E039 Hypothyroidism, unspecified: Secondary | ICD-10-CM | POA: Diagnosis not present

## 2018-02-17 DIAGNOSIS — E118 Type 2 diabetes mellitus with unspecified complications: Secondary | ICD-10-CM | POA: Diagnosis not present

## 2018-02-17 DIAGNOSIS — D508 Other iron deficiency anemias: Secondary | ICD-10-CM | POA: Diagnosis not present

## 2018-02-17 DIAGNOSIS — I96 Gangrene, not elsewhere classified: Secondary | ICD-10-CM

## 2018-02-17 DIAGNOSIS — Z9181 History of falling: Secondary | ICD-10-CM

## 2018-02-17 DIAGNOSIS — M7989 Other specified soft tissue disorders: Secondary | ICD-10-CM | POA: Diagnosis not present

## 2018-02-17 LAB — POCT GLYCOSYLATED HEMOGLOBIN (HGB A1C): HBA1C, POC (CONTROLLED DIABETIC RANGE): 7.3 % — AB (ref 0.0–7.0)

## 2018-02-17 MED ORDER — LEVOTHYROXINE SODIUM 125 MCG PO TABS: 125.0000 ug | ORAL_TABLET | Freq: Every day | ORAL | 1 refills | Status: DC

## 2018-02-17 MED ORDER — TETANUS-DIPHTH-ACELL PERTUSSIS 5-2.5-18.5 LF-MCG/0.5 IM SUSP: 0.5000 mL | Freq: Once | INTRAMUSCULAR | 0 refills | Status: AC

## 2018-02-17 MED ORDER — BACLOFEN 1 MG/ML ORAL SUSPENSION: 2.5000 mg | Freq: Two times a day (BID) | ORAL | 0 refills | Status: DC | PRN

## 2018-02-17 NOTE — Assessment & Plan Note (Signed)
Again asked patient to call GI to reschedule colonoscopy. Daughter states she will be able to take her to and from the procedure.

## 2018-02-17 NOTE — Progress Notes (Signed)
Date of Visit: 02/17/2018   HPI:  Patient presents for routine follow up.  Diabetes - not on any diabetes medications right now. A1c today 7.3, down from 7.5 at last visit. Has had recent eye exams with ophthalmologic interventions, will scan in records.  Hypothyroidism - had TSH checked a couple weeks ago, elevated. Currently on synthroid 151mcg daily. Feels tired.  Black third toe - on left foot. Has come up since last podiatry visit in early May. daughter present and confirms this is new.  GI appointment for colonoscopy - patient has not yet called to schedule this. She is agreeable to scheduling. Has been getting iron and epo infusions through nephrologist.  Neck pain - still with muscle soreness in her neck. Has not been able to get liquid baclofen (needs liquid due to renal dosing). Would like new rx.  HM - due for Tdap and pneumonia 23 valent today. Agreeable to getting PPSV23 in office. Needs printed Tdap rx.  Has upcoming geriatrics clinic appointment on 7 /11 at 3:30pm for cognitive assessment, would also benefit from fall risk assessment.  ROS: See HPI.  [place]: history of hypothyroidism, type 2 diabetes, hypertension, RA  PHYSICAL EXAM: BP 132/68   Pulse 79   Temp 98.5 F (36.9 C) (Oral)   Ht 5\' 2"  (1.575 m)   Wt 134 lb 12.8 oz (61.1 kg)   SpO2 98%   BMI 24.66 kg/m  Gen: no acute distress, pleasant, cooperative HEENT: normocephalic, atraumatic, moist mucous membranes. Some pain with palpation of posterior neck musculature. Heart: regular rate and rhythm, no murmur Lungs: clear to auscultation bilaterally, normal work of breathing  Neuro: alert grossly nonfocal, speech normal Ext: DP pulses not palpable on either side (swelling present). Third toe on left foot is black on dorsum of toe. nontender to palpation. No lesions on plantar surface of foot.  ASSESSMENT/PLAN:  Health maintenance:  -PPSV23 given today -printed Rx for Tdap given to patient & instructed to  take to her pharmacy  Black toe Check xray to rule out underlying osteo Daughter called and got podiatry appointment for tomorrow AM, patient followed by podiatry and previously by wound center for foot issues.  Uncontrolled type 2 diabetes mellitus with hyperglycemia (HCC) Adequate control with A1c of 7.3, off medications. Continue to monitor q3 month A1cs.  Hypothyroidism Last TSH slightly elevated, increase synthroid to 134mcg daily. Repeat TSH in 6 weeks.  Iron deficiency anemia Again asked patient to call GI to reschedule colonoscopy. Daughter states she will be able to take her to and from the procedure.  At high risk for falls Has upcoming geri appointment, would benefit from cognitive assessment & fall risk assessment.  Neck pain Muscular. Gave new rx for baclofen liquid at renal dosing  FOLLOW UP: Follow up in 1 day with podiatrist for toe Keep geri appointment on 7/11 See me early August  Sydelle Sherfield J. [name], [place]

## 2018-02-17 NOTE — Patient Instructions (Addendum)
Call GI to schedule your colonoscopy  Go see podiatrist tomorrow Get xray of toe.  Increasing synthroid dose.  Keep appointment in geriatrics clinic on 7/11 at 3:30pm.  Follow up with me in early August.  Be well, Dr. [name]

## 2018-02-17 NOTE — Assessment & Plan Note (Signed)
Last TSH slightly elevated, increase synthroid to 11mcg daily. Repeat TSH in 6 weeks.

## 2018-02-17 NOTE — Assessment & Plan Note (Signed)
Adequate control with A1c of 7.3, off medications. Continue to monitor q3 month A1cs.

## 2018-02-17 NOTE — Assessment & Plan Note (Signed)
Has upcoming geri appointment, would benefit from cognitive assessment & fall risk assessment.

## 2018-02-18 ENCOUNTER — Encounter: Payer: Self-pay | Admitting: Podiatry

## 2018-02-18 ENCOUNTER — Ambulatory Visit: Payer: Medicare Other | Admitting: Podiatry

## 2018-02-18 DIAGNOSIS — T148XXA Other injury of unspecified body region, initial encounter: Secondary | ICD-10-CM | POA: Diagnosis not present

## 2018-02-18 DIAGNOSIS — L989 Disorder of the skin and subcutaneous tissue, unspecified: Secondary | ICD-10-CM | POA: Diagnosis not present

## 2018-02-18 DIAGNOSIS — R609 Edema, unspecified: Secondary | ICD-10-CM

## 2018-02-18 DIAGNOSIS — E1159 Type 2 diabetes mellitus with other circulatory complications: Secondary | ICD-10-CM

## 2018-02-18 NOTE — Progress Notes (Signed)
This patient presents the office today for an evaluation of her second, third and fourth toes, left foot.  She says that she was at her medical doctors yesterday and her doctor noticed there was a black discoloration noted on multiple toes on her left foot.  Her PCP and was very concerned and x-ray was taken revealing no evidence of bony pathology.  This patient was unaware that the blackened toes had developed and she could not tell when  they first developed. .   This patient was told to present to the office today for an evaluation of her toes.  This patient is also concerned about the swelling that was present in her left ankle. She presents the office today for an evaluation of her left foot.  General Appearance  Alert, conversant and in no acute stress.  Vascular  Dorsalis pedis and posterior tibial  pulses are not  palpable  bilaterally.  Capillary return is within normal limits  bilaterally. Temperature is within normal limits  bilaterally.  Neurologic  Senn-Weinstein monofilament wire test diminished   bilaterally. Muscle power within normal limits bilaterally.  Nails Thick disfigured discolored nails with subungual debris  from hallux to fifth toes bilaterally. No evidence of bacterial infection or drainage bilaterally.  Orthopedic  No limitations of motion of motion feet .  No crepitus or effusions noted.  Arthritis IPJ right hallux.  HAV first MPJ left foot.  Arthritis first [facility] J left foot.  Skin   No signs of infections or ulcers noted.  Examination of her second, third and fourth toes do reveal the presence of healing blisters noted with no evidence of any drainage or fluctuance present.  See picture below    Healing blister 2,3,4 left foot.  Swelling left ankle greater than right ankle.   ROV.  Examination of the second, third and fourth toes reveal healing blisters.  Normal color noted at the distal aspect of the second, third and fourth digits, left foot.  Patient needs to just  allow these blisters to continue to heal.  She was dispensed a compression sock to be worn in an effort to reduce the swelling in her left ankle.  Return to the clinic for preventative foot care services as previously appointed.   Gardiner Barefoot DPM

## 2018-02-20 ENCOUNTER — Ambulatory Visit: Payer: Medicare Other | Admitting: Registered"

## 2018-02-23 ENCOUNTER — Encounter: Payer: Self-pay | Admitting: Family Medicine

## 2018-02-24 ENCOUNTER — Ambulatory Visit (HOSPITAL_COMMUNITY)
Admission: RE | Admit: 2018-02-24 | Discharge: 2018-02-24 | Disposition: A | Discharge status: Home / Self Care | Payer: Medicare Other | Source: Ambulatory Visit | Attending: Nephrology | Admitting: Nephrology

## 2018-02-24 VITALS — BP 126/75 | HR 72 | Resp 20 | Wt 145.0 lb

## 2018-02-24 DIAGNOSIS — D508 Other iron deficiency anemias: Secondary | ICD-10-CM | POA: Diagnosis not present

## 2018-02-24 LAB — POCT HEMOGLOBIN-HEMACUE: Hemoglobin: 9.5 g/dL — ABNORMAL LOW (ref 12.0–15.0)

## 2018-02-24 MED ORDER — EPOETIN ALFA-EPBX 40000 UNIT/ML IJ SOLN: 30000.0000 [IU] | INTRAMUSCULAR | Status: DC

## 2018-02-24 MED ORDER — EPOETIN ALFA-EPBX 10000 UNIT/ML IJ SOLN
10000.0000 [IU] | INTRAMUSCULAR | Status: DC
  Administered 2018-02-24: 10000 [IU] via SUBCUTANEOUS
  Filled 2018-02-24: qty 1

## 2018-02-26 ENCOUNTER — Ambulatory Visit (INDEPENDENT_AMBULATORY_CARE_PROVIDER_SITE_OTHER): Payer: Medicare Other | Admitting: Family Medicine

## 2018-02-26 ENCOUNTER — Encounter: Payer: Self-pay | Admitting: Family Medicine

## 2018-02-26 ENCOUNTER — Other Ambulatory Visit: Payer: Self-pay

## 2018-02-26 VITALS — BP 128/70 | HR 74 | Temp 98.1°F | Ht 62.0 in | Wt 133.0 lb

## 2018-02-26 DIAGNOSIS — Z7409 Other reduced mobility: Secondary | ICD-10-CM

## 2018-02-26 DIAGNOSIS — H9192 Unspecified hearing loss, left ear: Secondary | ICD-10-CM | POA: Diagnosis not present

## 2018-02-26 DIAGNOSIS — H9319 Tinnitus, unspecified ear: Secondary | ICD-10-CM | POA: Diagnosis not present

## 2018-02-26 DIAGNOSIS — R2689 Other abnormalities of gait and mobility: Secondary | ICD-10-CM

## 2018-02-26 DIAGNOSIS — R269 Unspecified abnormalities of gait and mobility: Secondary | ICD-10-CM | POA: Insufficient documentation

## 2018-02-26 DIAGNOSIS — R296 Repeated falls: Secondary | ICD-10-CM

## 2018-02-26 DIAGNOSIS — R413 Other amnesia: Secondary | ICD-10-CM | POA: Diagnosis not present

## 2018-02-26 DIAGNOSIS — Z9181 History of falling: Secondary | ICD-10-CM | POA: Diagnosis not present

## 2018-02-26 NOTE — Patient Instructions (Signed)
Your memory is great.   Your balance and thigh muscle strenth is impaired.  We will set up an [facility] outpatient neurorehabilitation physical therapy evaluation and treatment.  They will call you to make your first appointment.

## 2018-02-26 NOTE — Progress Notes (Signed)
[facility]:   Patient is accompanied by: daughter Primary caregiver: patient and daughter Patient's lives with their daughter. Patient information was obtained from patient, relative(s) and past medical records. History/Exam limitations: none. Primary Care Provider: [name], MD Referring provider: Chrisandra Netters, MD Reason for referral: Falls Chief Complaint  Patient presents with  . Fall     * Failed Minicog Previous Report Reviewed: historical medical records, imaging reports: head CT and office notes    HPI by problems:  Chief Complaint  Patient presents with  . Fall    Cognitive impairment concern What problems with thinking are there?  none  Has there been any tremors or abnormal movements?  no      Compared to 5 to 10 years ago, how is the patient at: Problems with Judgment, e.g., problem making decisions, bad financial decisions, problems with thinking? show no change   Less interested in hobbies or previously enjoyed activities? show no change   Trouble remembering appointments? show no change    Remembering things about family and friends e.g. names,  occupations, birthdays, addresses?  show no change  Remembering things that have happened recently?  show no change  Recalling conversations a few days later?  show no change  Remembering what day and month it is? show no change  Remembering where things are usually kept?  show no change  Losing things?  show no change  Handling money for shopping?  show no change  Handling financial matters, e.g. their pension,  dealing with the bank?  show no change  Getting lost?  show no change  FALL  Location: Outdoors Activity prior to fall: soon after [place] and stormy night with high winds Change in position prior to fall: no Dizziness prior to fall:  no Syncope prior to fall:  no Vertigo:  no Chest Pain/Palpitations:  no Loss of  balance:   yes Slip: no Trip: yes, curb Slide:  no What Body parts struck object(s) or ground: uncertain What object or surface was struck: sidewalk/road Injury: no Associated acute Illness no Medications (new or dose changes):  no Prior Work-up of fall or fall complications: no  Worry about falling:  yes   Vision Difficulty:  yes Independent in ADLs: yes  Independent in I-ADLs: yes  Hx of falls evaluation: no Hx of orthostatic dizziness: no Hx of problems with legs (strength, pain):  yes Hx of problems with feet: yes Hx of peripheral neuropathy: yes  Hx of arthritis:  yes  Hx of problems with balance: yes Hx of Urge Incontinence: Yes Hx of problems with Gait: yes (using 4 wheeled Rollator) Hx of Parkinsons Disease: no  Hx of Strokes: no Hx of Cardiac Disease:  no Hx problems with transfers:  no Hx of WC dependence:  no  Hx of Vitamin D insufficiency: no testing   Patient does fear falling or worries about falling She use a  (cane,walker,WC):  yes, Rollator-4-wheeled She does to push up with her hands when standing up from chair She does trouble stepping up on curbs She sometimes has to rush to the toilet to urinate  She does have lost  feeling in her feet She does have difficulty seeing at night   Outpatient Encounter Medications as of 02/26/2018  Medication Sig  . ACCU-CHEK SOFTCLIX LANCETS lancets USE three times daily AS DIRECTED  . albuterol (PROAIR HFA) 108 (90 Base) MCG/ACT inhaler INHALE 2 PUFFS INTO THE LUNGS EVERY 6 HOURS AS  NEEDED FOR WHEEZING OR SHORTNESS OF BREATH  . baclofen (LIORESAL) 10 mg/mL SUSP Take 0.25 mLs (2.5 mg total) by mouth 2 (two) times daily as needed.  . Bevacizumab (AVASTIN IV) Inject into the vein.  . ferrous sulfate 325 (65 FE) MG tablet Take 1 tablet (325 mg total) by mouth every other day.  . furosemide (LASIX) 80 MG tablet Take 80 mg by mouth daily.  Marland Kitchen glucose blood (ONETOUCH VERIO) test strip Check blood sugar daily  . Lancet  Devices (MICROLET NEXT LANCING DEVICE) MISC 1 each by Does not apply route 3 (three) times daily. Check blood sugar 3 times daily  . levothyroxine (SYNTHROID, LEVOTHROID) 125 MCG tablet Take 1 tablet (125 mcg total) by mouth daily.  Marland Kitchen MICROLET LANCETS MISC Use to test blood glucose three times daily. ICD-10 code: E11.65.  Marland Kitchen Na Sulfate-K Sulfate-Mg Sulf (SUPREP BOWEL PREP KIT) 17.5-3.13-1.6 GM/180ML SOLN Take 1 kit by mouth as directed.  . quinapril (ACCUPRIL) 10 MG tablet Take 10 mg by mouth daily.  . rosuvastatin (CRESTOR) 20 MG tablet Take 1 tablet (20 mg total) by mouth daily.   No facility-administered encounter medications on file as of 02/26/2018.     History Patient Active Problem List   Diagnosis Date Noted  . Iron deficiency anemia 03/19/2017  . Diabetic ulcer of toe ([place]) 12/12/2015  . Vision disturbance 03/20/2015  . Bilateral lower extremity edema 06/06/2014  . Sinus congestion 12/08/2013  . At high risk for falls 11/10/2012  . Weight loss 01/22/2012  . DIABETIC  RETINOPATHY 03/17/2007  . DIABETIC PERIPHERAL NEUROPATHY 03/17/2007  . Hypothyroidism 10/16/2006  . Uncontrolled type 2 diabetes mellitus with hyperglycemia ([place]) 10/16/2006  . OBESITY, NOS 10/16/2006  . HYPERTENSION, BENIGN SYSTEMIC 10/16/2006  . GASTROESOPHAGEAL REFLUX, NO ESOPHAGITIS 10/16/2006  . Rheumatoid arthritis ([place]) 10/16/2006  . OSTEOPENIA 10/16/2006  . INCONTINENCE, URGE 10/16/2006   Past Medical History:  Diagnosis Date  . Bilateral lower extremity edema 06/06/2014  . Cataracts, bilateral 02/13/2011   Seen on eye exam 02/07/11. F/u in 12 months   . Diabetes mellitus age 39  . Diabetic ulcer of toe ([place]) 12/12/2015  . Hyperlipidemia   . Hypertension   . Retinal detachment, old, partial    left  . Retinopathy due to secondary diabetes mellitus ([place])    L>R, laser 3/07  . Schizotypal personality disorder ([place])   . Thyroid disease   . Weight loss 01/22/2012   Past Surgical History:  Procedure  Laterality Date  . BREAST EXCISIONAL BIOPSY    . BREAST SURGERY    . CATARACT EXTRACTION     left   . ROTATOR CUFF REPAIR  1990's   left  . Thyroid radiation ablation     for Graves Disease  . TRANSTHORACIC ECHOCARDIOGRAM      EF55-65%, nml - 12/14/2004   Family History  Problem Relation Age of Onset  . Heart disease Mother   . Hypertension Mother   . Heart attack Mother   . Breast cancer Other   . Colon cancer Brother   . Colon cancer Maternal Grandmother   . Pulmonary fibrosis Father   . Diabetes Daughter   . Liver disease Sister        transplant, liver  . Liver cancer Brother    Social History   Socioeconomic History  . Marital status: Widowed    Spouse name: Not on file  . Number of children: 3  . Years of education: 69  . Highest education level: Not  on file  Occupational History  . Occupation: RetiredSoftware engineer   Social Needs  . Financial resource strain: Not on file  . Food insecurity:    Worry: Not on file    Inability: Not on file  . Transportation needs:    Medical: Not on file    Non-medical: Not on file  Tobacco Use  . Smoking status: Never Smoker  . Smokeless tobacco: Never Used  Substance and Sexual Activity  . Alcohol use: No  . Drug use: No  . Sexual activity: Not Currently  Lifestyle  . Physical activity:    Days per week: Not on file    Minutes per session: Not on file  . Stress: Not on file  Relationships  . Social connections:    Talks on phone: Not on file    Gets together: Not on file    Attends religious service: Not on file    Active member of club or organization: Not on file    Attends meetings of clubs or organizations: Not on file    Relationship status: Not on file  Other Topics Concern  . Not on file  Social History Narrative   3 daughters.  Takes care of 8 grandchildren at home.  Lots of stress.  no tobacco, no etoh.  Father is Cristy Hilts- deceased 2003/10/14. Sister - Casimer Leek.         Health Care POA:     Emergency Contact: daughter, Janett Billow (c) [phone]   End of Life Plan:    Who lives with you: self   Any pets: none   Diet: Pt has a varied diet of protein starch and vegetables. Pt reports eating a lot of potatoes and starches.  Does not follow diabetic diet.    Exercise: Pt does exercises 2x a week at [employer].    Seatbelts: Pt reports wearing seatbelt when in vehicles.    Sun Exposure/Protection: Pt reports not using sun protection.   Hobbies:  Attends Estate manager/land agent through [employer] Monday through Friday 9-5pm.  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      Current Social History  03/06/2017   Who lives at home: Lives alone in one level home; grandson stays occasionally 03/06/2017    Transportation: Bus or taxi currently as car is not working 03/06/2017   Important Relationships & Pets: "Everybody I meet." No pets 03/06/2017    Current Stressors: Transportation, bus line 03/06/2017   Work / Education:  Retired/ 12 th grade 03/06/2017   Religious / Personal Beliefs: "I believe in God, [place], and the resurrection." 03/06/2017   Interests / Fun: crafts, yard work, going to [employer] 03/06/2017   L. Ducatte, RN, BSN                                                                                                             Cardiovascular Risk Factors: Hypertension and IDDM Type 2  Personal History of Seizures: No -  Personal History of Stroke: No -  Personal History of Psychiatric  Disorders: Yes - schizotypal personality disorder listed in [place] Educational History: 12 years formal education    Basic Activities of Daily Living   Pt is independent in all ADLs and iADLs     FALLS in last five office visits:  Fall Risk  02/26/2018 02/17/2018 01/16/2018 12/19/2017 11/24/2017  Falls in the past year? Yes No No No No  Number falls in past yr: 1 - - - -  Injury with Fall? No - - - -  Comment - - - - -  Risk Factor Category  - - - - -  Comment - - - - -  Risk for fall due to :  Impaired balance/gait;Impaired mobility - - - -  Follow up - - - - -    Health Maintenance reviewed: Immunization History  Administered Date(s) Administered  . Influenza Split 05/14/2012  . Influenza Whole 07/10/2007, 05/11/2008, 05/13/2008  . Influenza,inj,Quad PF,6+ Mos 06/20/2017  . Pneumococcal Conjugate-13 01/14/2017  . Pneumococcal Polysaccharide-23 07/19/1994, 02/17/2018  . Td 10/22/2004   Health Maintenance Topics with due status: Overdue     Topic Date Due   COLONOSCOPY 11/17/1995   TETANUS/TDAP 10/23/2014    Diet: Regular Nutritional supplements: none  Geriatric Syndromes: Incontinence yes  Balance impairment: yes    Skin problems no   Visual Impairment yes   Hearing impairment no Eating impairment no  Behavioral problems no   Joint pain: yes Immobility: no Ankle edema: no   ROS Denies changes in weight;  (+) vision impairment (+) recent falls/unsteady gait;  Denies unilateral weakness / clumsiness denies tremors;  Denies sadness  Vital Signs Weight: 133 lb (60.3 kg) Body mass index is 24.33 kg/m. CrCl cannot be calculated (Patient's most recent lab result is older than the maximum 21 days allowed.). Body surface area is 1.62 meters squared. Vitals:   02/26/18 1529  BP: 128/70  Pulse: 74  Temp: 98.1 F (36.7 C)  TempSrc: Oral  SpO2: 99%  Weight: 133 lb (60.3 kg)  Height: _0  (1.575 m)   Wt Readings from Last 3 Encounters:  02/26/18 133 lb (60.3 kg)  02/24/18 145 lb (65.8 kg)  02/17/18 134 lb 12.8 oz (61.1 kg)    Visual Acuity Screening   Right eye Left eye Both eyes  Without correction: 0 20/100 20/100  With correction:     Comments: Has cataract in right eye and gets injections in the left for bleeding behind the eye.  She does have bifocals at home   Physical Examination:  VS reviewed GEN: Alert, Cooperative, Groomed, NAD HEENT: PERRL; EAC bilaterally not occluded,                COR: RRR, No M/G/R, No JVD, Normal PMI size and  location LUNGS: BCTA, No Acc mm use, speaking in full sentences EXT: No peripheral leg edema. SKIN: No lesion nor rashes of face/trunk/extremities Neuro: Oriented to person, place, and time; Strength: 5/5 Bil. UE and LE symmetric; Sensation: Intact grossly to touch all four extremities; Cerebellar: Finger-to-Nose intact, Rhomberg negative; Muscle Tone normal; Tremor not present  Timed Up & Go Test: 21 seconds Sit-to-Stand Test: 4 / 30-seconds 4-Stage Stand Test:  Feet Side-by-side: Yes.       Feet Semi-tandem: Yes.       Feet Tandem: No.     Gait: Significant path deviation, Step-through present, no swing phase past midline  Psych: Normal affect/thought/speech/language   Mini-Mental State Examination or Montreal Cognitive Assessment:  Patient did not require additional cues  or prompts to complete tasks. Patient was cooperative and attentive to testing tasks Patient did  appear motivated to perform well  MMSE - Mini Mental State Exam 11/26/2011  Orientation to time 5  Orientation to Place 5  Registration 3  Attention/ Calculation 5  Recall 3  Language- name 2 objects 2  Language- repeat 1  Language- follow 3 step command 3  Language- read & follow direction 1  Write a sentence 1  Copy design 1  Total score 30        Montreal Cognitive Assessment  03/02/2018  Visuospatial/ Executive (0/5) 0  Naming (0/3) 0  Attention: Read list of digits (0/2) 2  Attention: Read list of letters (0/1) 1  Attention: Serial 7 subtraction starting at 100 (0/3) 3  Language: Repeat phrase (0/2) 2  Language : Fluency (0/1) 1  Abstraction (0/2) 2  Delayed Recall (0/5) 4  Orientation (0/6) 6  Total 21  Adjusted Score (based on education) 22      Labs  Lab Results  Component Value Date   VITAMINB12 425 01/21/2017    Lab Results  Component Value Date   FOLATE 5.6 01/21/2017    Lab Results  Component Value Date   TSH 6.790 (H) 02/02/2018    No results found for: RPR    Chemistry       Component Value Date/Time   NA 139 11/18/2017 1222   K 4.8 11/18/2017 1222   CL 103 11/18/2017 1222   CO2 22 11/18/2017 1222   BUN 39 (H) 11/18/2017 1222   CREATININE 1.29 (H) 11/18/2017 1222   CREATININE 0.82 07/05/2014 1606      Component Value Date/Time   CALCIUM 9.3 11/18/2017 1222   ALKPHOS 91 11/18/2017 1222   AST 10 11/18/2017 1222   ALT 7 11/18/2017 1222   BILITOT 0.3 11/18/2017 1222       Lab Results  Component Value Date   HGBA1C 7.3 (A) 02/17/2018     _0 @  Visual Acuity Screening   Right eye Left eye Both eyes  Without correction: 0 20/100 20/100  With correction:     Comments: Has cataract in right eye and gets injections in the left for bleeding behind the eye.  She does have bifocals at home  Lab Results  Component Value Date   WBC 5.4 11/18/2017   HGB 9.5 (L) 02/24/2018   HCT 26.8 (L) 11/18/2017   MCV 85 11/18/2017   PLT 407 (H) 11/18/2017    No results found for this or any previous visit (from the past 24 hour(s)).  Imaging Head CT: May 12, 2017:  BRAIN: No intraparenchymal hemorrhage, mass effect nor midline shift. The ventricles and sulci are normal for age. Patchy supratentorial white matter hypodensities within normal range for patient's age, though non-specific are most compatible with chronic small vessel ischemic disease. No acute large vascular territory infarcts.    Assessment and Plan: Problem List Items Addressed This Visit      Low   Impaired functional mobility, balance, gait, and endurance (Chronic)    New problem  Impaired proximal muscle strength, balance and gait speed.  At risk for further falls, particularly in environments that are not conducive to safe walking.   Referral to outpatient physical therapy for evaluation and treatment.       At high risk for falls   Memory difficulty    No memory difficulty complaint.   MOCA-BLIND test 21 out of 22 points which converts to 28 out  of 30 on usual  MOCA scale.  Patient within normal range of MOCA test.  No evidence of cognitive impairment, including memory.        Abnormality of gait    Other Visit Diagnoses    Multiple falls    -  Primary   Relevant Orders   Ambulatory referral to Physical Therapy   Balance problems       Relevant Orders   Ambulatory referral to Physical Therapy   Hearing loss of left ear, unspecified hearing loss type       Relevant Orders   Ambulatory referral to Audiology   Tinnitus, unspecified laterality       Relevant Orders   Ambulatory referral to Audiology     Memory difficulty No memory difficulty complaint.   MOCA-BLIND test 21 out of 22 points which converts to 28 out of 30 on usual MOCA scale.  Patient within normal range of MOCA test.  No evidence of cognitive impairment, including memory.    Impaired functional mobility, balance, gait, and endurance New problem  Impaired proximal muscle strength, balance and gait speed.  At risk for further falls, particularly in environments that are not conducive to safe walking.   Referral to outpatient physical therapy for evaluation and treatment.   Personal Strengths Ability for insight Active sense of humor Average or above average intelligence Capable of independent living General fund of knowledge Special hobby/interest Supportive family/friends  Support System Strengths Supportive Relationships, Family, Financial controller and Able to Communicate Effectively  Advanced Directives: Code Status: Full code     50 minutes face to face where spent in total with counseling / coordination of care took more than 50% of the total time. Counseling involved discussion MOCA finding, Falls risk and fall assessment finding along with discussion of physical therapy, discussion of unilateral hearing difficulty and tinnitus findings and referral.

## 2018-03-02 ENCOUNTER — Encounter: Payer: Self-pay | Admitting: Family Medicine

## 2018-03-02 DIAGNOSIS — Z7409 Other reduced mobility: Secondary | ICD-10-CM | POA: Insufficient documentation

## 2018-03-02 NOTE — Assessment & Plan Note (Signed)
No memory difficulty complaint.   MOCA-BLIND test 21 out of 22 points which converts to 28 out of 30 on usual MOCA scale.  Patient within normal range of MOCA test.  No evidence of cognitive impairment, including memory.

## 2018-03-02 NOTE — Assessment & Plan Note (Signed)
New problem  Impaired proximal muscle strength, balance and gait speed.  At risk for further falls, particularly in environments that are not conducive to safe walking.   Referral to outpatient physical therapy for evaluation and treatment.

## 2018-03-06 ENCOUNTER — Encounter (HOSPITAL_COMMUNITY): Payer: Medicare Other

## 2018-03-06 ENCOUNTER — Ambulatory Visit: Payer: Medicare Other | Admitting: Family

## 2018-03-09 ENCOUNTER — Ambulatory Visit: Payer: Medicare Other | Admitting: Family

## 2018-03-09 ENCOUNTER — Ambulatory Visit (HOSPITAL_COMMUNITY): Admission: RE | Admit: 2018-03-09 | Payer: Medicare Other | Source: Ambulatory Visit

## 2018-03-12 ENCOUNTER — Ambulatory Visit: Payer: Medicare Other | Attending: Family Medicine | Admitting: Physical Therapy

## 2018-03-12 ENCOUNTER — Other Ambulatory Visit: Payer: Self-pay | Admitting: Family Medicine

## 2018-03-12 ENCOUNTER — Encounter: Payer: Self-pay | Admitting: Physical Therapy

## 2018-03-12 ENCOUNTER — Other Ambulatory Visit: Payer: Self-pay

## 2018-03-12 DIAGNOSIS — M6281 Muscle weakness (generalized): Secondary | ICD-10-CM

## 2018-03-12 DIAGNOSIS — R29898 Other symptoms and signs involving the musculoskeletal system: Secondary | ICD-10-CM

## 2018-03-12 DIAGNOSIS — R293 Abnormal posture: Secondary | ICD-10-CM

## 2018-03-12 DIAGNOSIS — R2689 Other abnormalities of gait and mobility: Secondary | ICD-10-CM
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
## 2018-03-14 ENCOUNTER — Other Ambulatory Visit: Payer: Self-pay | Admitting: Family Medicine

## 2018-03-17 ENCOUNTER — Ambulatory Visit (INDEPENDENT_AMBULATORY_CARE_PROVIDER_SITE_OTHER): Payer: Medicare Other | Admitting: Family Medicine

## 2018-03-17 ENCOUNTER — Encounter: Payer: Self-pay | Admitting: Family Medicine

## 2018-03-17 ENCOUNTER — Ambulatory Visit (HOSPITAL_COMMUNITY)
Admission: RE | Admit: 2018-03-17 | Discharge: 2018-03-17 | Disposition: A | Discharge status: Home / Self Care | Payer: Medicare Other | Source: Ambulatory Visit | Attending: Family Medicine | Admitting: Family Medicine

## 2018-03-17 ENCOUNTER — Ambulatory Visit (HOSPITAL_COMMUNITY)
Admission: RE | Admit: 2018-03-17 | Discharge: 2018-03-17 | Disposition: A | Discharge status: Home / Self Care | Payer: Medicare Other | Source: Ambulatory Visit | Attending: Nephrology | Admitting: Nephrology

## 2018-03-17 VITALS — BP 142/68 | HR 83 | Temp 98.8°F | Resp 15 | Ht 62.0 in | Wt 134.0 lb

## 2018-03-17 VITALS — BP 130/70 | HR 89 | Temp 98.0°F | Wt 134.4 lb

## 2018-03-17 DIAGNOSIS — R0600 Dyspnea, unspecified: Secondary | ICD-10-CM | POA: Insufficient documentation

## 2018-03-17 DIAGNOSIS — E039 Hypothyroidism, unspecified: Secondary | ICD-10-CM | POA: Diagnosis not present

## 2018-03-17 DIAGNOSIS — D508 Other iron deficiency anemias: Secondary | ICD-10-CM | POA: Diagnosis not present

## 2018-03-17 LAB — IRON AND TIBC
IRON: 71 ug/dL (ref 28–170)
SATURATION RATIOS: 35 % — AB (ref 10.4–31.8)
TIBC: 202 ug/dL — AB (ref 250–450)
UIBC: 131 ug/dL

## 2018-03-17 LAB — FERRITIN: Ferritin: 368 ng/mL — ABNORMAL HIGH (ref 11–307)

## 2018-03-17 MED ORDER — EPOETIN ALFA-EPBX 10000 UNIT/ML IJ SOLN
10000.0000 [IU] | INTRAMUSCULAR | Status: DC
  Administered 2018-03-17: 10000 [IU] via SUBCUTANEOUS
  Filled 2018-03-17: qty 1

## 2018-03-17 MED ORDER — EPOETIN ALFA-EPBX 40000 UNIT/ML IJ SOLN: 30000.0000 [IU] | INTRAMUSCULAR | Status: DC

## 2018-03-17 MED ORDER — EPOETIN ALFA 20000 UNIT/ML IJ SOLN
INTRAMUSCULAR | Status: AC
  Filled 2018-03-17: qty 1

## 2018-03-17 MED ORDER — ALBUTEROL SULFATE HFA 108 (90 BASE) MCG/ACT IN AERS: INHALATION_SPRAY | RESPIRATORY_TRACT | 0 refills | Status: DC

## 2018-03-17 NOTE — Patient Instructions (Signed)
Getting ultrasound of your heart. Increase lasix to 80mg  twice daily for a week to see if this helps with your breathing.  Checking thyroid lab today Sent in albuterol inhaler  Follow up in 1 month with any doctor here.  Be well, Dr. [name]

## 2018-03-17 NOTE — Progress Notes (Signed)
Date of Visit: 03/17/2018   HPI:  Patient presents for routine follow up.  Shortness of breath - requests albuterol inhaler. Reports it helps some when she gets more short of breath in the heat. Has not had an inhaler recently. Nonsmoker. No chest pain. Has noticed increased shortness of breath since March of this year. Has not seen a doctor for this yet.  Neck pain - got baclofen finally, which has helped her discomfort a lot. Happy with this medication. Is going to have physical therapy starting this Friday.  Hypothyroidism - currently taking synthroid 128mcg daily. Due for TSH check in about 2 weeks.  ROS: See HPI.  [place]: history of rheumatoid arthritis, diabetes, hypertension, hypothyroidism  PHYSICAL EXAM: BP 130/70 (BP Location: Right Arm, Patient Position: Sitting, Cuff Size: Normal)   Pulse 89   Temp 98 F (36.7 C) (Oral)   Wt 134 lb 6.4 oz (61 kg)   SpO2 98%   BMI 24.58 kg/m  Gen: no acute distress, pleasant, cooperative, well appearing HEENT: normocephalic, atraumatic, moist mucous membranes  Heart: regular rate and rhythm, no murmur Lungs: normal work of breathing. Crackles present in bilateral bases. Otherwise lungs clear Neuro: alert, grossly nonfocal, speech normal  ASSESSMENT/PLAN:  Hypothyroidism Check TSH at upcoming lab visit as patient not able to stay today for labs, had another appointment.   Dyspnea Came up as patient requesting albuterol inhaler refill. On further questioning, dyspnea worsened since March. Crackles present in bilateral lower lung fields. No recent echo. EKG without acute findings. Will check echo. Trial of increasing lasix for the next week, see if this improves her symptoms. She will follow up with another doctor in the next month as I will be on maternity leave. Sent in albuterol inhaler for patient as well.  Neck pain Improved with baclofen. Continue this medication. Also to start physical therapy later this week.  FOLLOW  UP: Follow up in 1 mo for above issues.  [place]. [name], [place]

## 2018-03-18 ENCOUNTER — Telehealth: Payer: Self-pay

## 2018-03-18 LAB — POCT HEMOGLOBIN-HEMACUE: Hemoglobin: 11.1 g/dL — ABNORMAL LOW (ref 12.0–15.0)

## 2018-03-18 NOTE — Telephone Encounter (Signed)
Called patient with appointment for echocardiogram on 03/19/2018 1500 at [facility]. Left message as there was no answer.  [name], [place]

## 2018-03-19 ENCOUNTER — Ambulatory Visit (HOSPITAL_COMMUNITY): Payer: Medicare Other

## 2018-03-20 ENCOUNTER — Ambulatory Visit: Payer: Medicare Other | Admitting: Physical Therapy

## 2018-03-23 ENCOUNTER — Telehealth: Payer: Self-pay | Admitting: Family Medicine

## 2018-03-23 ENCOUNTER — Encounter: Payer: Medicare Other | Admitting: Physical Therapy

## 2018-03-23 DIAGNOSIS — R809 Proteinuria, unspecified: Secondary | ICD-10-CM | POA: Diagnosis not present

## 2018-03-23 DIAGNOSIS — D631 Anemia in chronic kidney disease: Secondary | ICD-10-CM | POA: Diagnosis not present

## 2018-03-23 DIAGNOSIS — E1122 Type 2 diabetes mellitus with diabetic chronic kidney disease: Secondary | ICD-10-CM | POA: Diagnosis not present

## 2018-03-23 DIAGNOSIS — R0600 Dyspnea, unspecified: Secondary | ICD-10-CM | POA: Insufficient documentation

## 2018-03-23 DIAGNOSIS — I129 Hypertensive chronic kidney disease with stage 1 through stage 4 chronic kidney disease, or unspecified chronic kidney disease: Secondary | ICD-10-CM | POA: Diagnosis not present

## 2018-03-23 DIAGNOSIS — N183 Chronic kidney disease, stage 3 (moderate): Secondary | ICD-10-CM | POA: Diagnosis not present

## 2018-03-23 NOTE — Assessment & Plan Note (Addendum)
Check TSH at upcoming lab visit as patient not able to stay today for labs, had another appointment.

## 2018-03-23 NOTE — Assessment & Plan Note (Addendum)
Came up as patient requesting albuterol inhaler refill. On further questioning, dyspnea worsened since March. Crackles present in bilateral lower lung fields. No recent echo. EKG without acute findings. Will check echo. Trial of increasing lasix for the next week, see if this improves her symptoms. She will follow up with another doctor in the next month as I will be on maternity leave. Sent in albuterol inhaler for patient as well.

## 2018-03-23 NOTE — Telephone Encounter (Signed)
Red team, Can you call patient and check on her breathing? I want to know how she is doing on the increased dose of lasix. It also appears she canceled the appointment for her echo, if this could be rescheduled.  If she has questions I am happy to speak with her.  Thanks, [name], MD

## 2018-03-24 ENCOUNTER — Other Ambulatory Visit: Payer: Medicare Other

## 2018-03-24 ENCOUNTER — Telehealth: Payer: Self-pay | Admitting: Family Medicine

## 2018-03-24 DIAGNOSIS — E039 Hypothyroidism, unspecified: Secondary | ICD-10-CM

## 2018-03-24 NOTE — Telephone Encounter (Signed)
See previous phone note. Catie Chiao, Salome Spotted, CMA

## 2018-03-24 NOTE — Telephone Encounter (Signed)
Patient left message she is returning a call. Called patient back to give PCP's message. Voicemail left to call back.  Danley Danker, RN [facility] RN)

## 2018-03-24 NOTE — Telephone Encounter (Signed)
She is here for labs and wanted to speak to Dr. Lennie Odor nurse.

## 2018-03-24 NOTE — Telephone Encounter (Signed)
Pt came in for labs.  Danley Danker, RN took her to office to assess. Dayle Sherpa, Salome Spotted, CMA

## 2018-03-24 NOTE — Telephone Encounter (Signed)
LMVOM for pt to return call. [place], Salome Spotted, CMA

## 2018-03-24 NOTE — Telephone Encounter (Signed)
Patient wanted to speak to nurse about missed call from PCP. Patient doing well on Lasix 80 mg. O2 sat in office 99%. Stated just saw Dr Deterding, nephrologist. He told her to D/C Baclofen and to take Lasix 80 mg on M, W, F and 160 mg on all other days.  Stated daughter could not get off work to take her to echo but they are working on rescheduling it.  Danley Danker, RN [facility] RN)

## 2018-03-25 ENCOUNTER — Encounter: Payer: Self-pay | Admitting: Family Medicine

## 2018-03-25 ENCOUNTER — Other Ambulatory Visit: Payer: Self-pay | Admitting: Family Medicine

## 2018-03-25 DIAGNOSIS — H353221 Exudative age-related macular degeneration, left eye, with active choroidal neovascularization: Secondary | ICD-10-CM | POA: Diagnosis not present

## 2018-03-25 LAB — TSH: TSH: 0.802 u[IU]/mL (ref 0.450–4.500)

## 2018-03-25 MED ORDER — LEVOTHYROXINE SODIUM 125 MCG PO TABS: 125.0000 ug | ORAL_TABLET | Freq: Every day | ORAL | 3 refills | Status: DC

## 2018-03-25 NOTE — Telephone Encounter (Signed)
Noted. Thanks for the updates! [name], MD

## 2018-04-02 ENCOUNTER — Other Ambulatory Visit: Payer: Self-pay

## 2018-04-02 ENCOUNTER — Ambulatory Visit (INDEPENDENT_AMBULATORY_CARE_PROVIDER_SITE_OTHER): Payer: Medicare Other | Admitting: Family

## 2018-04-02 ENCOUNTER — Ambulatory Visit (HOSPITAL_COMMUNITY)
Admission: RE | Admit: 2018-04-02 | Discharge: 2018-04-02 | Disposition: A | Discharge status: Home / Self Care | Payer: Medicare Other | Source: Ambulatory Visit | Attending: Family | Admitting: Family

## 2018-04-02 ENCOUNTER — Encounter: Payer: Self-pay | Admitting: Family

## 2018-04-02 VITALS — BP 170/87 | HR 69 | Temp 97.0°F | Resp 16 | Ht 62.0 in | Wt 130.0 lb

## 2018-04-02 DIAGNOSIS — I779 Disorder of arteries and arterioles, unspecified: Secondary | ICD-10-CM

## 2018-04-02 DIAGNOSIS — R609 Edema, unspecified: Secondary | ICD-10-CM

## 2018-04-02 NOTE — Patient Instructions (Addendum)
  To decrease swelling in your feet and legs: Elevate feet above slightly bent knees, feet above heart, overnight and 3-4 times per day for 20 minutes.   To measure for knee high compression hose: Measure the length of calf (from the crease of the knee to the bottom of the heel), largest circumference of calf, and ankle circumference first thing in the morning before your legs have a chance to swell.  Take these 3 measurements with you to obtain 15-20 mm mercury graduated knee high compression hose.  Put the stockings on in the morning, remove at bedtime.      Peripheral Vascular Disease Peripheral vascular disease (PVD) is a disease of the blood vessels that are not part of your heart and brain. A simple term for PVD is poor circulation. In most cases, PVD narrows the blood vessels that carry blood from your heart to the rest of your body. This can result in a decreased supply of blood to your arms, legs, and internal organs, like your stomach or kidneys. However, it most often affects a person's lower legs and feet. There are two types of PVD.  Organic PVD. This is the more common type. It is caused by damage to the structure of blood vessels.  Functional PVD. This is caused by conditions that make blood vessels contract and tighten (spasm).  Without treatment, PVD tends to get worse over time. PVD can also lead to acute ischemic limb. This is when an arm or limb suddenly has trouble getting enough blood. This is a medical emergency. Follow these instructions at home:  Take medicines only as told by your doctor.  Do not use any tobacco products, including cigarettes, chewing tobacco, or electronic cigarettes. If you need help quitting, ask your doctor.  Lose weight if you are overweight, and maintain a healthy weight as told by your doctor.  Eat a diet that is low in fat and cholesterol. If you need help, ask your doctor.  Exercise regularly. Ask your doctor for some good  activities for you.  Take good care of your feet. ? Wear comfortable shoes that fit well. ? Check your feet often for any cuts or sores. Contact a doctor if:  You have cramps in your legs while walking.  You have leg pain when you are at rest.  You have coldness in a leg or foot.  Your skin changes.  You are unable to get or have an erection (erectile dysfunction).  You have cuts or sores on your feet that are not healing. Get help right away if:  Your arm or leg turns cold and blue.  Your arms or legs become red, warm, swollen, painful, or numb.  You have chest pain or trouble breathing.  You suddenly have weakness in your face, arm, or leg.  You become very confused or you cannot speak.  You suddenly have a very bad headache.  You suddenly cannot see. This information is not intended to replace advice given to you by your health care provider. Make sure you discuss any questions you have with your health care provider. Document Released: 10/30/2009 Document Revised: 01/11/2016 Document Reviewed: 01/13/2014 Elsevier Interactive Patient Education  2017 [employer].

## 2018-04-02 NOTE — Progress Notes (Signed)
[facility] [place]   CC: Follow up peripheral artery occlusive disease  History of Present Illness [name] is a 72 y.o. female whom Dr. Con Memos referred for evaluation of arterial and venous disease with non-healing wound. The patient states she does not really know how the wound first started but it took about a month to heal. She was seen at the wound care center but says she has been discharged. She denies any claudication symptoms. She denies rest pain.   Other medical problems include diabetes, schizotypal personality disorder, hypertension and thyroid disease all of which are stable.  When Dr. Oneida Alar evaluated pt in July 2017, thewound on her foot had nearly healed,but definitely hadevidence of peripheral arterial disease.  Venous duplex exam on05/16/2017 showed no evidence of reflux.  Dr. Oneida Alar advised pt tofollow-up with [place] in 6 months time for repeat noninvasive arterial exam. She'll follow-up sooner if she has any worsening of the wound on her foot or a new wound appears. At that point we would consider an arteriogram.   Pt was evaluated at an urgent care facility on 10-06-16 for left great toe ulcer and edema in her feet. Clindamycin prescription was provided. (review of records). Pt was advised to follow up with her PCP and podiatrist in 2-3 days.   She was in wound care treatment at [facility], Dr. Con Memos, since March 2018, has been discharged since her foot ulcer healed.   She is seeing a nephrologist; serum creatinine on 11-18-17 was 1.29, GFR was 48 (review of records).   She will be receiving physical therapy once/week for what sounds like gait strengthening.    Pt Diabetic: Yes, A1C was 7.3 (02-17-18)  Pt smoker: non-smoker  Pt meds include: Statin :No Betablocker: No ASA: Yes Other anticoagulants/antiplatelets: no     Past Medical History:  Diagnosis Date  . Bilateral lower extremity edema 06/06/2014  . Cataracts, bilateral  02/13/2011   Seen on eye exam 02/07/11. F/u in 12 months   . Diabetes mellitus age 25  . Diabetic ulcer of toe ([place]) 12/12/2015  . Hyperlipidemia   . Hypertension   . Retinal detachment, old, partial    left  . Retinopathy due to secondary diabetes mellitus ([place])    L>R, laser 3/07  . Schizotypal personality disorder ([place])   . Thyroid disease   . Weight loss 01/22/2012    Social History Social History   Tobacco Use  . Smoking status: Never Smoker  . Smokeless tobacco: Never Used  Substance Use Topics  . Alcohol use: No  . Drug use: No    Family History Family History  Problem Relation Age of Onset  . Heart disease Mother   . Hypertension Mother   . Heart attack Mother   . Breast cancer Other   . Colon cancer Brother   . Colon cancer Maternal Grandmother   . Pulmonary fibrosis Father   . Diabetes Daughter   . Liver disease Sister        transplant, liver  . Liver cancer Brother     Past Surgical History:  Procedure Laterality Date  . BREAST EXCISIONAL BIOPSY    . BREAST SURGERY    . CATARACT EXTRACTION     left   . ROTATOR CUFF REPAIR  1990's   left  . Thyroid radiation ablation     for Graves Disease  . TRANSTHORACIC ECHOCARDIOGRAM      EF55-65%, nml - 12/14/2004    Allergies  Allergen Reactions  .  Feraheme [Ferumoxytol] Itching  . Rosiglitazone Maleate Other (See Comments)    REACTION: Difficulty walking, Fatigue, shortness of breath    Current Outpatient Medications  Medication Sig Dispense Refill  . ACCU-CHEK SOFTCLIX LANCETS lancets USE three times daily AS DIRECTED 100 each 11  . acetaminophen (TYLENOL) 325 MG tablet Take 650 mg by mouth every 6 (six) hours as needed.    Marland Kitchen albuterol (PROAIR HFA) 108 (90 Base) MCG/ACT inhaler INHALE 2 PUFFS INTO THE LUNGS EVERY 6 HOURS AS NEEDED FOR WHEEZING OR SHORTNESS OF BREATH 8 g 0  . baclofen (LIORESAL) 10 mg/mL SUSP Take 0.25 mLs (2.5 mg total) by mouth 2 (two) times daily as needed. 10 mL 0  . Bevacizumab  (AVASTIN IV) Inject into the vein.    . ferrous sulfate 325 (65 FE) MG tablet Take 1 tablet (325 mg total) by mouth every other day. 45 tablet 3  . furosemide (LASIX) 80 MG tablet Take 80 mg by mouth daily.    Marland Kitchen glucose blood (ONETOUCH VERIO) test strip Check blood sugar daily 100 each 3  . Lancet Devices (MICROLET NEXT LANCING DEVICE) MISC 1 each by Does not apply route 3 (three) times daily. Check blood sugar 3 times daily 1 each 0  . levothyroxine (SYNTHROID, LEVOTHROID) 125 MCG tablet Take 1 tablet (125 mcg total) by mouth daily. 90 tablet 3  . MICROLET LANCETS MISC Use to test blood glucose three times daily. ICD-10 code: E11.65. 100 each 12  . quinapril (ACCUPRIL) 10 MG tablet Take 10 mg by mouth daily.    . rosuvastatin (CRESTOR) 20 MG tablet Take 1 tablet (20 mg total) by mouth daily. 90 tablet 3   No current facility-administered medications for this visit.     ROS: See HPI for pertinent positives and negatives.   Physical Examination  Vitals:   04/02/18 1141 04/02/18 1143  BP: (!) 159/82 (!) 170/87  Pulse: 69   Resp: 16   Temp: (!) 97 F (36.1 C)   TempSrc: Oral   SpO2: 98%   Weight: 130 lb (59 kg)   Height: 5\' 2"  (1.575 m)    Body mass index is 23.78 kg/m.  General: A&O x 3, WDWN, female. Gait: using walker, slow, steady HENT: no gross abnormalities  Eyes: PERRLA. Pulmonary: Respirations are non labored, CTAB, good air movement in all fields Cardiac: regular rhythm, no detected murmur.    Carotid Bruits Right Left   Negative Negative   Abdominal aortic pulse is notpalpable. Radial pulses: 2+ palpable bilaterally  VASCULAR EXAM: Extremitieswithoutischemic changes, withoutGangrene; withoutopen wounds:trace non pitting and pitting edema in both feet, ankles, and lower legs.  LE Pulses Right Left  FEMORAL faintlypalpable 2+palpable   POPLITEAL notpalpable  notpalpable  POSTERIOR TIBIAL  notpalpable  notpalpable   DORSALIS PEDIS ANTERIOR TIBIAL 1+palpable  1+palpable    Abdomen: soft, NT, no palpable masses. Skin: no rashes, no cellulitis, no ulcers noted, see Extremities Musculoskeletal: no muscle wasting or atrophy. Neurologic: A&O X 3; appropriate affect, Sensation is normal; MOTOR FUNCTION:  moving all extremities equally, motor strength 5/5 in upper extremities, 4/5 in lower extremities. Speech is fluent/normal. CN 2-12 intact. Psychiatric: Thought content is normal, mood appropriate for clinical situation.     ASSESSMENT: [name] is a 72 y.o. female who presents with: peripheral artery occlusive disease most likely secondary to long term diabetes mellitus. She denies ever using tobacco.  Ulcer at the dorsal aspect of her left 3rd toe has completely healed.  She seems  to have medial calcification of the arteries in her lower legs as the arterial vessels are non compressible, a diabetic complication, giving falsely elevated ankle pressures.  There is less dependent edema in her feet and lower legs since it appears that she has been elevating her legs often.   She lost her knee high compression hose, thought they made her legs feel better; will advise a lower compression hose (15-20 mm Hg) so as not to constrict her arterial flow. See Patient Instructions.   DATA  ABI (Date: 04/02/2018):  R:   ABI: [place] (was [place] on 06-05-17),   PT: bi  DP: bi  TBI:  0.54, toe pressure 99 (was 0.61)  L:   ABI: [place] (was ),   PT: bi  DP: bi  TBI: 0.60, toe pressure 110 (was 0.72) Bilateral ankle arteries remain calcified with non compressible vessels, bilateral waveforms remain biphasic, slight decline in bilateral TBI with more than adequate toe pressures for healing.    She had lower extremity noninvasive arterial exam in May 2017which showed calcified vessels bilaterally but biphasic to triphasic waveforms and toe pressure of  101 on the left 102 on the right. A repeat ofthat arterial study in July 2017 showed similar findings.  Venous duplex exam 01/02/2016 showed no evidence of reflux.      PLAN:  I advised her to to safely walk as much as possible, and when not walking to elevate her feet above her heart as often as possible to minimize dependent edema. Based on the patient's vascular studies and examination, pt will return to clinic in 6 monthswith ABI's.  I advised her to notify us if she develops concerns re the circulation in her feet/legs.    I discussed in depth with the patient the nature of atherosclerosis, and emphasized the importance of maximal medical management including strict control of blood pressure, blood glucose, and lipid levels, obtaining regular exercise, and continued cessation of smoking.  The patient is aware that without maximal medical management the underlying atherosclerotic disease process will progress, limiting the benefit of any interventions.  The patient was given information about PAD including signs, symptoms, treatment, what symptoms should prompt the patient to seek immediate medical care, and risk reduction measures to take.  Clemon Chambers, RN, MSN, FNP-C [facility] [employer] Phone: [phone]  Clinic MD: [facility]  04/02/18 12:04 PM

## 2018-04-03 ENCOUNTER — Ambulatory Visit (HOSPITAL_COMMUNITY)
Admission: RE | Admit: 2018-04-03 | Discharge: 2018-04-03 | Disposition: A | Discharge status: Home / Self Care | Payer: Medicare Other | Source: Ambulatory Visit | Attending: Family Medicine | Admitting: Family Medicine

## 2018-04-03 ENCOUNTER — Encounter: Payer: Medicare Other | Admitting: Physical Therapy

## 2018-04-03 DIAGNOSIS — R413 Other amnesia: Secondary | ICD-10-CM | POA: Insufficient documentation

## 2018-04-03 DIAGNOSIS — I119 Hypertensive heart disease without heart failure: Secondary | ICD-10-CM | POA: Insufficient documentation

## 2018-04-03 DIAGNOSIS — M069 Rheumatoid arthritis, unspecified: Secondary | ICD-10-CM | POA: Diagnosis not present

## 2018-04-03 DIAGNOSIS — E119 Type 2 diabetes mellitus without complications: Secondary | ICD-10-CM | POA: Diagnosis not present

## 2018-04-03 DIAGNOSIS — I080 Rheumatic disorders of both mitral and aortic valves: Secondary | ICD-10-CM | POA: Diagnosis not present

## 2018-04-03 DIAGNOSIS — R0600 Dyspnea, unspecified: Secondary | ICD-10-CM | POA: Insufficient documentation

## 2018-04-03 NOTE — Progress Notes (Signed)
  Echocardiogram 2D Echocardiogram has been performed.  [name] 04/03/2018, 3:57 PM

## 2018-04-06 ENCOUNTER — Ambulatory Visit: Payer: Medicare Other | Attending: Family Medicine | Admitting: Physical Therapy

## 2018-04-06 ENCOUNTER — Encounter: Payer: Self-pay | Admitting: Physical Therapy

## 2018-04-06 DIAGNOSIS — R293 Abnormal posture: Secondary | ICD-10-CM | POA: Diagnosis not present

## 2018-04-06 DIAGNOSIS — M6281 Muscle weakness (generalized): Secondary | ICD-10-CM | POA: Insufficient documentation

## 2018-04-06 DIAGNOSIS — R2689 Other abnormalities of gait and mobility: Secondary | ICD-10-CM | POA: Insufficient documentation

## 2018-04-06 NOTE — Patient Instructions (Signed)
Provided pt with [place] (with support) booklet and began education in [place] program. Asked pt to bring booklet with her to each appt

## 2018-04-06 NOTE — Therapy (Signed)
[facility] [address] [place], [place], [address] Phone: [phone]   Fax:  [phone]  Physical Therapy Treatment  Patient Details  Name: [name] MRN: [id number] Date of Birth: June 12, 1946 Referring Provider: McDiarmid, Blane Ohara, MD   Encounter Date: 04/06/2018  PT End of Session - 04/06/18 1454    Visit Number  2    Number of Visits  9    Date for PT Re-Evaluation  05/11/18    Authorization Type  [employer] Medicare    Authorization Time Period  03/12/18 to 06/10/2018    PT Start Time  1401    PT Stop Time  1444    PT Time Calculation (min)  43 min    Activity Tolerance  Patient tolerated treatment well    Behavior During Therapy  [facility] for tasks assessed/performed       Past Medical History:  Diagnosis Date  . Bilateral lower extremity edema 06/06/2014  . Cataracts, bilateral 02/13/2011   Seen on eye exam 02/07/11. F/u in 12 months   . Diabetes mellitus age 64  . Diabetic ulcer of toe ([place]) 12/12/2015  . Hyperlipidemia   . Hypertension   . Retinal detachment, old, partial    left  . Retinopathy due to secondary diabetes mellitus ([place])    L>R, laser 3/07  . Schizotypal personality disorder ([place])   . Thyroid disease   . Weight loss 01/22/2012    Past Surgical History:  Procedure Laterality Date  . BREAST EXCISIONAL BIOPSY    . BREAST SURGERY    . CATARACT EXTRACTION     left   . ROTATOR CUFF REPAIR  1990's   left  . Thyroid radiation ablation     for Graves Disease  . TRANSTHORACIC ECHOCARDIOGRAM      EF55-65%, nml - 12/14/2004    There were no vitals filed for this visit.  Subjective Assessment - 04/06/18 1403    Subjective  States no changes. Wants to be able to walk without rollator and not be afraid to fall     Pertinent History  DM, neuropathy, retinopathy, HTN, GERD, RA, osteopenia, urge incontinence, Schizotypal personality disorder     Limitations  Standing;Walking    Patient Stated Goals   to walk without the rollator; to improve her posture and "not end up all bent over like so many old people"    Currently in Pain?  No/denies                       [facility] Adult PT Treatment/Exercise - 04/06/18 1413      Ambulation/Gait   Ambulation/Gait  Yes    Ambulation/Gait Assistance  5: Supervision    Ambulation/Gait Assistance Details  vc for proper use of rollator (tends to push too far ahead)--doesn't want handles lowered to proper height    Ambulation Distance (Feet)  80 Feet   30, 80   Assistive device  Rollator    Gait Pattern  Step-through pattern;Decreased step length - left;Decreased step length - right;Right foot flat;Left foot flat;Trendelenburg;Lateral hip instability;Wide base of support;Poor foot clearance - left;Poor foot clearance - right    Ambulation Surface  Indoor      Standardized Balance Assessment   Standardized Balance Assessment  Berg Balance Test      Berg Balance Test   Sit to Stand  Able to stand without using hands and stabilize independently    Standing Unsupported  Able to stand 2 minutes  with supervision    Sitting with Back Unsupported but Feet Supported on Floor or Stool  Able to sit safely and securely 2 minutes    Stand to Sit  Sits safely with minimal use of hands    Transfers  Able to transfer safely, minor use of hands    Standing Unsupported with Eyes Closed  Able to stand 10 seconds with supervision    Standing Ubsupported with Feet Together  Able to place feet together independently and stand for 1 minute with supervision    From Standing, Reach Forward with Outstretched Arm  Can reach forward >12 cm safely (5")    From Standing Position, Pick up Object from [place] to pick up shoe, needs supervision    From Standing Position, Turn to Look Behind Over each Shoulder  Looks behind one side only/other side shows less weight shift    Turn 360 Degrees  Needs close supervision or verbal cueing    Standing Unsupported,  Alternately Place Feet on Step/Stool  Able to complete >2 steps/needs minimal assist    Standing Unsupported, One Foot in Front  Able to plae foot ahead of the other independently and hold 30 seconds    Standing on One Leg  Tries to lift leg/unable to hold 3 seconds but remains standing independently    Total Score  40          Balance Exercises - 04/06/18 1451      OTAGO PROGRAM   Head Movements  Sitting;5 reps    Neck Movements  Sitting;5 reps    Ankle Movements  Sitting;10 reps    Knee Extensor  10 reps   red band   Knee Flexor  10 reps    Sit to Stand  --   5 reps no support from chair       PT Education - 04/06/18 1453    Education Details  results of Berg (40/56) and high fall risk; indicates need for walker; initiated HEP    Person(s) Educated  Patient    Methods  Explanation;Demonstration;Verbal cues;Tactile cues;Handout    Comprehension  Verbalized understanding;Returned demonstration;Verbal cues required;Tactile cues required;Need further instruction       PT Short Term Goals - 03/13/18 0803      PT SHORT TERM GOAL #1   Title  Patient will be independent with basic HEP (Target for all STGs 04/11/2018)    Time  4    Period  Weeks    Status  New    Target Date  04/11/18      PT SHORT TERM GOAL #2   Title  Patient will decrease 5x sit to stand to < 35 seconds to demonstrate decr in fall risk.    Time  4    Period  Weeks    Status  New      PT SHORT TERM GOAL #3   Title  Patient will improve gait velocity to >=1.81 ft/sec to demonstrate decr in fall risk    Time  4    Period  Weeks    Status  New      PT SHORT TERM GOAL #4   Title  Patient will improve Berg score by 4 points to show improved balance    Time  4    Period  Weeks    Status  New      PT SHORT TERM GOAL #5   Title  Patient will ambulate 400 ft with LRAD modified independently over level  indoor surfaces including scanning environment.     Time  4    Period  Weeks    Status  New         PT Long Term Goals - 03/13/18 9458      PT LONG TERM GOAL #1   Title  Patient will be independent with updated HEP and able to verbalize plan for continued community-based exercise upon discharge from PT. (Target all LTGs 05/11/2018)    Time  8    Period  Weeks    Status  New    Target Date  05/11/18      PT LONG TERM GOAL #2   Title  Patient will decrease 5x sit to stand to <28 seconds to show decreasing fall risk.     Time  8    Period  Weeks    Status  New      PT LONG TERM GOAL #3   Title  Patient will improve gait velocity to >=2.5 ft/sec to show decreased fall risk    Time  8    Period  Weeks    Status  New      PT LONG TERM GOAL #4   Title  Patient will improve Berg score by 3 points over her score at STG measure (4 week measure)    Time  8    Period  Weeks    Status  New      PT LONG TERM GOAL #5   Title  Patient will ambulate with LRAD x 500 feet over paved outdoor surfaces modified independent, including up/down curb.     Time  8    Period  Weeks    Status  New            Plan - 04/06/18 1456    Clinical Impression Statement  Patient educated in proper use of rollator and demonstrated ability to stand much more erect with head over her shoulders. Initiated [place] with booklet updated and pt to take it home. Patient reports she wants to work on walking without the rollator when she is here. She knows she is not safe to do this on her own. Patient can continue to benefit from PT.     Rehab Potential  Good    Clinical Impairments Affecting Rehab Potential  cognition, high potential for open foot wounds    PT Frequency  1x / week    PT Duration  8 weeks    PT Treatment/Interventions  ADLs/Self Care Home Management;Gait training;DME Instruction;Stair training;Functional mobility training;Therapeutic activities;Therapeutic exercise;Balance training;Neuromuscular re-education;Cognitive remediation;Manual techniques;Orthotic Fit/Training;Patient/family  education;Passive range of motion;Visual/perceptual remediation/compensation    PT Next Visit Plan  continue education in HEP [facility] with support); ?have pt try walking in //bars with single UE support (she desperately wants to walk without rollator)--?try cane if appropriate to work on here    [employer] and Agree with Plan of Care  Patient       Patient will benefit from skilled therapeutic intervention in order to improve the following deficits and impairments:  Abnormal gait, Decreased balance, Decreased cognition, Decreased endurance, Decreased knowledge of use of DME, Decreased strength, Increased edema, Impaired sensation, Impaired vision/preception, Postural dysfunction  Visit Diagnosis: Muscle weakness (generalized)  Other abnormalities of gait and mobility  Abnormal posture     Problem List Patient Active Problem List   Diagnosis Date Noted  . Dyspnea 03/23/2018  . Impaired functional mobility, balance, gait, and endurance 03/02/2018    Class: Diagnosis of  .  Abnormality of gait 02/26/2018  . Iron deficiency anemia 03/19/2017  . Vision disturbance 03/20/2015  . Memory difficulty 03/20/2015  . At high risk for falls 11/10/2012  . DIABETIC  RETINOPATHY 03/17/2007  . DIABETIC PERIPHERAL NEUROPATHY 03/17/2007  . Hypothyroidism 10/16/2006  . Uncontrolled type 2 diabetes mellitus with hyperglycemia ([place]) 10/16/2006  . OBESITY, NOS 10/16/2006  . HYPERTENSION, BENIGN SYSTEMIC 10/16/2006  . GASTROESOPHAGEAL REFLUX, NO ESOPHAGITIS 10/16/2006  . Rheumatoid arthritis ([place]) 10/16/2006  . OSTEOPENIA 10/16/2006  . INCONTINENCE, URGE 10/16/2006    Jeanie Cooks Chae Shuster,PT 04/06/2018, 3:00 PM  [facility] [address] [place], [place], [address] Phone: [phone]   Fax:  [phone]  Name: [name] MRN: [id number] Date of Birth: [birth year]

## 2018-04-07 ENCOUNTER — Ambulatory Visit (HOSPITAL_COMMUNITY)
Admission: RE | Admit: 2018-04-07 | Discharge: 2018-04-07 | Disposition: A | Discharge status: Home / Self Care | Payer: Medicare Other | Source: Ambulatory Visit | Attending: Nephrology | Admitting: Nephrology

## 2018-04-07 VITALS — BP 136/73 | HR 71 | Temp 97.9°F | Resp 16

## 2018-04-07 DIAGNOSIS — D631 Anemia in chronic kidney disease: Secondary | ICD-10-CM | POA: Insufficient documentation

## 2018-04-07 DIAGNOSIS — N183 Chronic kidney disease, stage 3 (moderate): Secondary | ICD-10-CM | POA: Insufficient documentation

## 2018-04-07 DIAGNOSIS — D508 Other iron deficiency anemias: Secondary | ICD-10-CM | POA: Diagnosis not present

## 2018-04-07 LAB — POCT HEMOGLOBIN-HEMACUE: Hemoglobin: 10.8 g/dL — ABNORMAL LOW (ref 12.0–15.0)

## 2018-04-07 MED ORDER — EPOETIN ALFA-EPBX 10000 UNIT/ML IJ SOLN
30000.0000 [IU] | INTRAMUSCULAR | Status: DC
  Administered 2018-04-07: 30000 [IU] via SUBCUTANEOUS
  Filled 2018-04-07: qty 3

## 2018-04-10 ENCOUNTER — Other Ambulatory Visit: Payer: Self-pay | Admitting: Family Medicine

## 2018-04-13 ENCOUNTER — Encounter (HOSPITAL_COMMUNITY): Payer: Medicare Other

## 2018-04-13 ENCOUNTER — Ambulatory Visit: Payer: Medicare Other | Admitting: Family

## 2018-04-15 ENCOUNTER — Other Ambulatory Visit: Payer: Self-pay | Admitting: Family Medicine

## 2018-04-17 ENCOUNTER — Ambulatory Visit: Payer: Medicare Other | Admitting: Physical Therapy

## 2018-04-17 ENCOUNTER — Encounter: Payer: Self-pay | Admitting: Physical Therapy

## 2018-04-17 DIAGNOSIS — R2689 Other abnormalities of gait and mobility: Secondary | ICD-10-CM | POA: Diagnosis not present

## 2018-04-17 DIAGNOSIS — M6281 Muscle weakness (generalized): Secondary | ICD-10-CM | POA: Diagnosis not present

## 2018-04-17 DIAGNOSIS — R293 Abnormal posture: Secondary | ICD-10-CM | POA: Diagnosis not present

## 2018-04-17 NOTE — Therapy (Signed)
[facility] [address] [place], [place], [address] Phone: [phone]   Fax:  [phone]  Physical Therapy Treatment  Patient Details  Name: [name] MRN: [id number] Date of Birth: 09/19/1945 Referring Provider: McDiarmid, Blane Ohara, MD   Encounter Date: 04/17/2018  PT End of Session - 04/17/18 1401    Visit Number  3    Number of Visits  9    Date for PT Re-Evaluation  05/11/18    Authorization Type  [employer] Medicare    Authorization Time Period  03/12/18 to 06/10/2018    PT Start Time  1022    PT Stop Time  1100    PT Time Calculation (min)  38 min    Activity Tolerance  Patient tolerated treatment well    Behavior During Therapy  [facility] for tasks assessed/performed       Past Medical History:  Diagnosis Date  . Bilateral lower extremity edema 06/06/2014  . Cataracts, bilateral 02/13/2011   Seen on eye exam 02/07/11. F/u in 12 months   . Diabetes mellitus age 57  . Diabetic ulcer of toe ([place]) 12/12/2015  . Hyperlipidemia   . Hypertension   . Retinal detachment, old, partial    left  . Retinopathy due to secondary diabetes mellitus ([place])    L>R, laser 3/07  . Schizotypal personality disorder ([place])   . Thyroid disease   . Weight loss 01/22/2012    Past Surgical History:  Procedure Laterality Date  . BREAST EXCISIONAL BIOPSY    . BREAST SURGERY    . CATARACT EXTRACTION     left   . ROTATOR CUFF REPAIR  1990's   left  . Thyroid radiation ablation     for Graves Disease  . TRANSTHORACIC ECHOCARDIOGRAM      EF55-65%, nml - 12/14/2004    There were no vitals filed for this visit.  Subjective Assessment - 04/17/18 1024    Subjective  I want to know why I can't walk without my shoes on. I also have another pair of diabetic shoes that make me feel like I'm going to fall. Denies pain in feet being an issue. States she has always shuffled her feet.    Pertinent History  DM, neuropathy, retinopathy, HTN, GERD,  RA, osteopenia, urge incontinence, Schizotypal personality disorder     Limitations  Standing;Walking    Patient Stated Goals  to walk without the rollator; to improve her posture and "not end up all bent over like so many old people"    Currently in Pain?  No/denies         Treatment- Gait-pt assessed with shoes and rollator (best quality of gait; safest) and without shoes with rollator vs without shoes without rollator (how she states she walks at home). Pt noted to have significant ataxic-like movements of bil LEs without her shoes. Patient has a very thin insole in her shoes, soles are flexible, and shoes overall very light therefore unsure why wearing her shoes makes such a tremendous difference in her gait.                    Balance Exercises - 04/17/18 1403      OTAGO PROGRAM   Back Extension  Standing;5 reps   back to counter and leaning back   Trunk Movements  Standing;5 reps    Hip ABductor  20 reps    Ankle Plantorflexors  --   10 reps support   Ankle Dorsiflexors  --  10 reps support   Knee Bends  10 reps, support    Backwards Walking  Support   imbalance; changed to hold at sink step backward and forward   Sideways Walking  Assistive device        PT Education - 04/17/18 1405    Education Details  additions to [employer] program for home; each exercise 3x/week but can divide them up and do not have to do all at one time    Person(s) Educated  Patient    Methods  Explanation;Handout;Verbal cues    Comprehension  Verbalized understanding;Need further instruction;Verbal cues required       PT Short Term Goals - 04/17/18 1716      PT SHORT TERM GOAL #1   Title  Patient will be independent with basic HEP (Target for all STGs 04/11/2018; defer to 04/24/18 due to missed appts)    Time  4    Period  Weeks    Status  New      PT SHORT TERM GOAL #2   Title  Patient will decrease 5x sit to stand to < 35 seconds to demonstrate decr in fall risk.    Time   4    Period  Weeks    Status  New      PT SHORT TERM GOAL #3   Title  Patient will improve gait velocity to >=1.81 ft/sec to demonstrate decr in fall risk    Time  4    Period  Weeks    Status  New      PT SHORT TERM GOAL #4   Title  Patient will improve Berg score by 4 points to show improved balance    Time  4    Period  Weeks    Status  New      PT SHORT TERM GOAL #5   Title  Patient will ambulate 400 ft with LRAD modified independently over level indoor surfaces including scanning environment.     Time  4    Period  Weeks    Status  New        PT Long Term Goals - 03/13/18 1610      PT LONG TERM GOAL #1   Title  Patient will be independent with updated HEP and able to verbalize plan for continued community-based exercise upon discharge from PT. (Target all LTGs 05/11/2018)    Time  8    Period  Weeks    Status  New    Target Date  05/11/18      PT LONG TERM GOAL #2   Title  Patient will decrease 5x sit to stand to <28 seconds to show decreasing fall risk.     Time  8    Period  Weeks    Status  New      PT LONG TERM GOAL #3   Title  Patient will improve gait velocity to >=2.5 ft/sec to show decreased fall risk    Time  8    Period  Weeks    Status  New      PT LONG TERM GOAL #4   Title  Patient will improve Berg score by 3 points over her score at STG measure (4 week measure)    Time  8    Period  Weeks    Status  New      PT LONG TERM GOAL #5   Title  Patient will ambulate with LRAD x 500 feet over paved  outdoor surfaces modified independent, including up/down curb.     Time  8    Period  Weeks    Status  New            Plan - 04/17/18 1407    Clinical Impression Statement  Initial portion of session focused on gait assessment and training with pt noted to have much improved step length and quality of gait when using rollator. She does not want to concede she will need to use the rollator "forever" but acknowledges she currently is much safer with  it. Remaining session focused on instruction of [place] exercises. Patient unable to safely walk backwards at the counter and instead had her step back and then return to feet together while facing/holding onto the sink. Patient making progress despite delay in starting PT sessions and has missed 3 appts. Will plan to check STGs next appointment.     Rehab Potential  Good    Clinical Impairments Affecting Rehab Potential  cognition, high potential for open foot wounds    PT Frequency  1x / week    PT Duration  8 weeks    PT Treatment/Interventions  ADLs/Self Care Home Management;Gait training;DME Instruction;Stair training;Functional mobility training;Therapeutic activities;Therapeutic exercise;Balance training;Neuromuscular re-education;Cognitive remediation;Manual techniques;Orthotic Fit/Training;Patient/family education;Passive range of motion;Visual/perceptual remediation/compensation    PT Next Visit Plan  check STGs; finish education in HEP [employer] with support); ?have pt try walking in //bars with single UE support (she desperately wants to walk without rollator)--?try cane if appropriate to work on here    [employer] and Agree with Plan of Care  Patient       Patient will benefit from skilled therapeutic intervention in order to improve the following deficits and impairments:  Abnormal gait, Decreased balance, Decreased cognition, Decreased endurance, Decreased knowledge of use of DME, Decreased strength, Increased edema, Impaired sensation, Impaired vision/preception, Postural dysfunction  Visit Diagnosis: Muscle weakness (generalized)  Other abnormalities of gait and mobility     Problem List Patient Active Problem List   Diagnosis Date Noted  . Dyspnea 03/23/2018  . Impaired functional mobility, balance, gait, and endurance 03/02/2018    Class: Diagnosis of  . Abnormality of gait 02/26/2018  . Iron deficiency anemia 03/19/2017  . Vision disturbance 03/20/2015  . Memory difficulty  03/20/2015  . At high risk for falls 11/10/2012  . DIABETIC  RETINOPATHY 03/17/2007  . DIABETIC PERIPHERAL NEUROPATHY 03/17/2007  . Hypothyroidism 10/16/2006  . Uncontrolled type 2 diabetes mellitus with hyperglycemia ([place]) 10/16/2006  . OBESITY, NOS 10/16/2006  . HYPERTENSION, BENIGN SYSTEMIC 10/16/2006  . GASTROESOPHAGEAL REFLUX, NO ESOPHAGITIS 10/16/2006  . Rheumatoid arthritis ([place]) 10/16/2006  . OSTEOPENIA 10/16/2006  . INCONTINENCE, URGE 10/16/2006    Rexanne Mano, PT 04/17/2018, 5:17 PM  [facility] [address] [place], [place], [address] Phone: [phone]   Fax:  [phone]  Name: [name] MRN: [id number] Date of Birth: [birth year]

## 2018-04-22 ENCOUNTER — Ambulatory Visit: Payer: Medicare Other | Admitting: *Deleted

## 2018-04-24 ENCOUNTER — Ambulatory Visit: Payer: Medicare Other | Admitting: Podiatry

## 2018-04-27 ENCOUNTER — Ambulatory Visit: Payer: Medicare Other | Attending: Family Medicine | Admitting: Physical Therapy

## 2018-04-27 ENCOUNTER — Encounter: Payer: Self-pay | Admitting: Physical Therapy

## 2018-04-27 DIAGNOSIS — M6281 Muscle weakness (generalized): Secondary | ICD-10-CM | POA: Insufficient documentation

## 2018-04-27 DIAGNOSIS — R29898 Other symptoms and signs involving the musculoskeletal system: Secondary | ICD-10-CM | POA: Insufficient documentation

## 2018-04-27 DIAGNOSIS — R2689 Other abnormalities of gait and mobility: Secondary | ICD-10-CM | POA: Diagnosis not present

## 2018-04-27 DIAGNOSIS — R293 Abnormal posture: Secondary | ICD-10-CM | POA: Diagnosis not present

## 2018-04-27 NOTE — Therapy (Signed)
[facility] [address] [place], [place], [address] Phone: [phone]   Fax:  [phone]  Physical Therapy Treatment  Patient Details  Name: [name] MRN: [id number] Date of Birth: Oct 20, 1945 Referring Provider: McDiarmid, Blane Ohara, MD   Encounter Date: 04/27/2018  PT End of Session - 04/27/18 2027    Visit Number  4    Number of Visits  9    Date for PT Re-Evaluation  05/11/18    Authorization Type  [employer] Medicare    Authorization Time Period  03/12/18 to 06/10/2018    PT Start Time  1150    PT Stop Time  1238    PT Time Calculation (min)  48 min    Activity Tolerance  Patient tolerated treatment well    Behavior During Therapy  [facility] for tasks assessed/performed       Past Medical History:  Diagnosis Date  . Bilateral lower extremity edema 06/06/2014  . Cataracts, bilateral 02/13/2011   Seen on eye exam 02/07/11. F/u in 12 months   . Diabetes mellitus age 56  . Diabetic ulcer of toe ([place]) 12/12/2015  . Hyperlipidemia   . Hypertension   . Retinal detachment, old, partial    left  . Retinopathy due to secondary diabetes mellitus ([place])    L>R, laser 3/07  . Schizotypal personality disorder ([place])   . Thyroid disease   . Weight loss 01/22/2012    Past Surgical History:  Procedure Laterality Date  . BREAST EXCISIONAL BIOPSY    . BREAST SURGERY    . CATARACT EXTRACTION     left   . ROTATOR CUFF REPAIR  1990's   left  . Thyroid radiation ablation     for Graves Disease  . TRANSTHORACIC ECHOCARDIOGRAM      EF55-65%, nml - 12/14/2004    There were no vitals filed for this visit.  Subjective Assessment - 04/27/18 1155    Subjective  I brought my shoes and shoe inserts for you to look at (pair she has that make her feel like she is going to fall forward)    Pertinent History  DM, neuropathy, retinopathy, HTN, GERD, RA, osteopenia, urge incontinence, Schizotypal personality disorder     Limitations   Standing;Walking    Patient Stated Goals  to walk without the rollator; to improve her posture and "not end up all bent over like so many old people"    Currently in Pain?  No/denies                       [facility] Adult PT Treatment/Exercise - 04/27/18 2021      Ambulation/Gait   Ambulation/Gait Assistance  5: Supervision    Ambulation/Gait Assistance Details  attempted with her second pair of shoes with again nearly ataxic appearing gait; pt describes feeling she does not know where her feet are and noted these shoes have increased cushion in foot bed compared to her other dizbetic shoes and likely this (combined with her neuropathy) is why she has difficulty with these shoes. Her preferred shoes have a thing flat sole with VERY thin shoe insert making it easier for her to "feel the floor"    Ambulation Distance (Feet)  50 Feet   30 x3   Assistive device  Rollator    Gait Pattern  Step-through pattern;Decreased step length - left;Decreased step length - right;Right foot flat;Left foot flat;Trendelenburg;Lateral hip instability;Wide base of support;Poor foot clearance - left;Poor foot  clearance - right    Ambulation Surface  Indoor          Balance Exercises - 04/27/18 2019      OTAGO PROGRAM   Tandem Stance  10 seconds, support    Tandem Walk  Support    One Leg Stand  10 seconds, support    Sit to Stand  --   7 reps no UE support   Overall OTAGO Comments  tried tandem walk at counter with single UE support with pt too unsteady; educated in keep one foot stationary while other foot steps to tandem position and then same foot steps back to tandem with opposite foot forward (which was too easy for her); ultimately she did well with tandem walk using her rollator        PT Education - 04/27/18 2025    Education Details  final [place] exercises in program; next visit will check STGs; focus on balance and strengthening with available gym equipment    Person(s) Educated   Patient    Methods  Explanation    Comprehension  Verbalized understanding       PT Short Term Goals - 04/27/18 2031      PT SHORT TERM GOAL #1   Title  Patient will be independent with basic HEP (Target for all STGs 04/11/2018; defer to 05/04/18 due to missed appts)    Time  4    Period  Weeks    Status  New      PT SHORT TERM GOAL #2   Title  Patient will decrease 5x sit to stand to < 35 seconds to demonstrate decr in fall risk.    Time  4    Period  Weeks    Status  New      PT SHORT TERM GOAL #3   Title  Patient will improve gait velocity to >=1.81 ft/sec to demonstrate decr in fall risk    Time  4    Period  Weeks    Status  New      PT SHORT TERM GOAL #4   Title  Patient will improve Berg score by 4 points to show improved balance    Time  4    Period  Weeks    Status  New      PT SHORT TERM GOAL #5   Title  Patient will ambulate 400 ft with LRAD modified independently over level indoor surfaces including scanning environment.     Time  4    Period  Weeks    Status  New        PT Long Term Goals - 04/27/18 2032      PT LONG TERM GOAL #1   Title  Patient will be independent with updated HEP and able to verbalize plan for continued community-based exercise upon discharge from PT. (Target all LTGs 05/11/2018; extended to 05/29/18 due to late start to treatment after intial evaluation)    Time  8    Period  Weeks    Status  New      PT LONG TERM GOAL #2   Title  Patient will decrease 5x sit to stand to <28 seconds to show decreasing fall risk.     Time  8    Period  Weeks    Status  New      PT LONG TERM GOAL #3   Title  Patient will improve gait velocity to >=2.5 ft/sec to show decreased fall risk  Time  8    Period  Weeks    Status  New      PT LONG TERM GOAL #4   Title  Patient will improve Berg score by 3 points over her score at STG measure (4 week measure)    Time  8    Period  Weeks    Status  New      PT LONG TERM GOAL #5   Title  Patient  will ambulate with LRAD x 500 feet over paved outdoor surfaces modified independent, including up/down curb.     Time  8    Period  Weeks    Status  New            Plan - 04/27/18 2028    Clinical Impression Statement  Due to patient's several week gap between evaluation and initial treatment, date to assess progress towards STGs has been extended to next visit (and date for achieving LTGs will similarly be extended). Patient speaks of wanting to get well enough to not need her rollator, yet also acknowledges she has become very dependent upon it. Anticipate slow, steady progress.     Rehab Potential  Good    Clinical Impairments Affecting Rehab Potential  cognition, high potential for open foot wounds    PT Frequency  1x / week    PT Duration  8 weeks    PT Treatment/Interventions  ADLs/Self Care Home Management;Gait training;DME Instruction;Stair training;Functional mobility training;Therapeutic activities;Therapeutic exercise;Balance training;Neuromuscular re-education;Cognitive remediation;Manual techniques;Orthotic Fit/Training;Patient/family education;Passive range of motion;Visual/perceptual remediation/compensation    PT Next Visit Plan  check STGs; ?have pt try walking in //bars with single UE support (she desperately wants to walk without rollator)--?try cane if appropriate to work on here; ?Nustep for endurance and LE strengthening    Consulted and Agree with Plan of Care  Patient       Patient will benefit from skilled therapeutic intervention in order to improve the following deficits and impairments:  Abnormal gait, Decreased balance, Decreased cognition, Decreased endurance, Decreased knowledge of use of DME, Decreased strength, Increased edema, Impaired sensation, Impaired vision/preception, Postural dysfunction  Visit Diagnosis: Muscle weakness (generalized)  Other abnormalities of gait and mobility  Other symptoms and signs involving the musculoskeletal  system     Problem List Patient Active Problem List   Diagnosis Date Noted  . Dyspnea 03/23/2018  . Impaired functional mobility, balance, gait, and endurance 03/02/2018    Class: Diagnosis of  . Abnormality of gait 02/26/2018  . Iron deficiency anemia 03/19/2017  . Vision disturbance 03/20/2015  . Memory difficulty 03/20/2015  . At high risk for falls 11/10/2012  . DIABETIC  RETINOPATHY 03/17/2007  . DIABETIC PERIPHERAL NEUROPATHY 03/17/2007  . Hypothyroidism 10/16/2006  . Uncontrolled type 2 diabetes mellitus with hyperglycemia ([place]) 10/16/2006  . OBESITY, NOS 10/16/2006  . HYPERTENSION, BENIGN SYSTEMIC 10/16/2006  . GASTROESOPHAGEAL REFLUX, NO ESOPHAGITIS 10/16/2006  . Rheumatoid arthritis ([place]) 10/16/2006  . OSTEOPENIA 10/16/2006  . INCONTINENCE, URGE 10/16/2006    Rexanne Mano, PT 04/27/2018, 8:36 PM  [facility] [address] [place], [place], [address] Phone: [phone]   Fax:  [phone]  Name: RACINE ERBY MRN: [id number] Date of Birth: 08-12-1946

## 2018-04-28 ENCOUNTER — Ambulatory Visit (HOSPITAL_COMMUNITY)
Admission: RE | Admit: 2018-04-28 | Discharge: 2018-04-28 | Disposition: A | Discharge status: Home / Self Care | Payer: Medicare Other | Source: Ambulatory Visit | Attending: Nephrology | Admitting: Nephrology

## 2018-04-28 VITALS — BP 166/76 | HR 79 | Temp 98.1°F | Resp 20

## 2018-04-28 DIAGNOSIS — N183 Chronic kidney disease, stage 3 (moderate): Secondary | ICD-10-CM | POA: Insufficient documentation

## 2018-04-28 DIAGNOSIS — D508 Other iron deficiency anemias: Secondary | ICD-10-CM

## 2018-04-28 DIAGNOSIS — D631 Anemia in chronic kidney disease: Secondary | ICD-10-CM | POA: Insufficient documentation

## 2018-04-28 DIAGNOSIS — Z5181 Encounter for therapeutic drug level monitoring: Secondary | ICD-10-CM | POA: Insufficient documentation

## 2018-04-28 DIAGNOSIS — Z79899 Other long term (current) drug therapy: Secondary | ICD-10-CM | POA: Diagnosis not present

## 2018-04-28 LAB — IRON AND TIBC
IRON: 28 ug/dL (ref 28–170)
Saturation Ratios: 17 % (ref 10.4–31.8)
TIBC: 161 ug/dL — AB (ref 250–450)
UIBC: 133 ug/dL

## 2018-04-28 LAB — POCT HEMOGLOBIN-HEMACUE: HEMOGLOBIN: 10.1 g/dL — AB (ref 12.0–15.0)

## 2018-04-28 LAB — FERRITIN: Ferritin: 527 ng/mL — ABNORMAL HIGH (ref 11–307)

## 2018-04-28 MED ORDER — EPOETIN ALFA-EPBX 40000 UNIT/ML IJ SOLN
30000.0000 [IU] | INTRAMUSCULAR | Status: DC
  Administered 2018-04-28: 30000 [IU] via SUBCUTANEOUS
  Filled 2018-04-28: qty 1

## 2018-04-28 MED ORDER — EPOETIN ALFA-EPBX 10000 UNIT/ML IJ SOLN
30000.0000 [IU] | INTRAMUSCULAR | Status: DC
  Filled 2018-04-28: qty 3

## 2018-05-05 ENCOUNTER — Ambulatory Visit: Payer: Medicare Other | Admitting: Physical Therapy

## 2018-05-05 ENCOUNTER — Encounter: Payer: Self-pay | Admitting: Physical Therapy

## 2018-05-05 DIAGNOSIS — M6281 Muscle weakness (generalized): Secondary | ICD-10-CM

## 2018-05-05 DIAGNOSIS — R2689 Other abnormalities of gait and mobility: Secondary | ICD-10-CM

## 2018-05-05 DIAGNOSIS — R29898 Other symptoms and signs involving the musculoskeletal system: Secondary | ICD-10-CM

## 2018-05-05 DIAGNOSIS — R293 Abnormal posture: Secondary | ICD-10-CM

## 2018-05-05 NOTE — Therapy (Signed)
[facility] [address] [place], [place], [address] Phone: [phone]   Fax:  [phone]  Physical Therapy Treatment  Patient Details  Name: [name] MRN: [id number] Date of Birth: [birth year] Referring Provider: McDiarmid, Blane Ohara, MD   Encounter Date: 05/05/2018  PT End of Session - 05/05/18 1401    Visit Number  5    Number of Visits  9    Date for PT Re-Evaluation  05/11/18    Authorization Type  [employer] Medicare    Authorization Time Period  03/12/18 to 06/10/2018    PT Start Time  1315    PT Stop Time  1400    PT Time Calculation (min)  45 min    Activity Tolerance  Patient tolerated treatment well    Behavior During Therapy  [facility] for tasks assessed/performed       Past Medical History:  Diagnosis Date  . Bilateral lower extremity edema 06/06/2014  . Cataracts, bilateral 02/13/2011   Seen on eye exam 02/07/11. F/u in 12 months   . Diabetes mellitus age 72  . Diabetic ulcer of toe ([place]) 12/12/2015  . Hyperlipidemia   . Hypertension   . Retinal detachment, old, partial    left  . Retinopathy due to secondary diabetes mellitus ([place])    L>R, laser 3/07  . Schizotypal personality disorder ([place])   . Thyroid disease   . Weight loss 01/22/2012    Past Surgical History:  Procedure Laterality Date  . BREAST EXCISIONAL BIOPSY    . BREAST SURGERY    . CATARACT EXTRACTION     left   . ROTATOR CUFF REPAIR  1990's   left  . Thyroid radiation ablation     for Graves Disease  . TRANSTHORACIC ECHOCARDIOGRAM      EF55-65%, nml - 12/14/2004    There were no vitals filed for this visit.  Subjective Assessment - 05/05/18 1321    Subjective  Pt reports no issues walking with her shoes and shoe inserts. Pt reports both legs swelled up yesterday because she ate pizza and cannot eat salt. Upon closer observation, Pt has observable redness and irritation to anterior lower tibia on L side with patient indicating  discomfort but no recent falls or injuries.     Pertinent History  DM, neuropathy, retinopathy, HTN, GERD, RA, osteopenia, urge incontinence, Schizotypal personality disorder     Limitations  Standing;Walking    Patient Stated Goals  to walk without the rollator; to improve her posture and "not end up all bent over like so many old people"    Currently in Pain?  No/denies   reports discomfort to B feet due to swelling        [facility] PT Assessment - 05/05/18 1330      Standardized Balance Assessment   Standardized Balance Assessment  --                   [facility] Adult PT Treatment/Exercise - 05/05/18 1331      Transfers   Transfers  Sit to Stand;Stand to Sit    Sit to Stand  6: Modified independent (Device/Increase time)    Five time sit to stand comments   22.56 seconds    Stand to Sit  6: Modified independent (Device/Increase time)    Comments  Pt able to perform STS from standard height chair with no UE assist       Ambulation/Gait   Ambulation/Gait  Yes  Ambulation/Gait Assistance  6: Modified independent (Device/Increase time)   requires cues to scan environment   Ambulation/Gait Assistance Details  Pt able to ambulate >400 feet on level and uneven surfaces with mod I using rollator. Pt requires supervision when performing curbs/ declines with therapist providing VC's for brake application to control descent and reduce fall risk    Ambulation Distance (Feet)  400 Feet   using rollator   Assistive device  Rollator    Gait Pattern  Step-through pattern;Decreased stride length;Decreased dorsiflexion - right;Decreased dorsiflexion - left;Right foot flat;Left foot flat;Trunk flexed;Poor foot clearance - left;Poor foot clearance - right    Ambulation Surface  Level;Unlevel;Indoor;Outdoor    Gait velocity  2.54 ft/sec     Curb  5: Supervision   rollator and cues for safety      Standardized Balance Assessment   Standardized Balance Assessment  Berg Balance Test       Berg Balance Test   Sit to Stand  Able to stand without using hands and stabilize independently    Standing Unsupported  Able to stand 2 minutes with supervision    Sitting with Back Unsupported but Feet Supported on Floor or Stool  Able to sit safely and securely 2 minutes    Stand to Sit  Sits safely with minimal use of hands    Transfers  Able to transfer safely, minor use of hands    Standing Unsupported with Eyes Closed  Able to stand 10 seconds with supervision    Standing Ubsupported with Feet Together  Able to place feet together independently and stand for 1 minute with supervision    From Standing, Reach Forward with Outstretched Arm  Reaches forward but needs supervision    From Standing Position, Pick up Object from [place] to pick up shoe, needs supervision    From Standing Position, Turn to Look Behind Over each Shoulder  Looks behind from both sides and weight shifts well    Turn 360 Degrees  Needs close supervision or verbal cueing    Standing Unsupported, Alternately Place Feet on Step/Stool  Able to complete >2 steps/needs minimal assist    Standing Unsupported, One Foot in Front  Able to plae foot ahead of the other independently and hold 30 seconds    Standing on One Leg  Tries to lift leg/unable to hold 3 seconds but remains standing independently    Total Score  39             PT Education - 05/05/18 1401    Education Details  clinical findings for re assessment of STG's, information for nearby [employer] for L LE edema and irritation to distal anterior tibia; safety considerations with rollator    Person(s) Educated  Patient    Methods  Explanation    Comprehension  Verbalized understanding       PT Short Term Goals - 05/05/18 1325      PT SHORT TERM GOAL #1   Title  Patient will be independent with basic HEP (Target for all STGs 04/11/2018; defer to 05/04/18 due to missed appts)    Time  4    Period  Weeks    Status  Achieved      PT  SHORT TERM GOAL #2   Title  Patient will decrease 5x sit to stand to < 35 seconds to demonstrate decr in fall risk.    Baseline  Pt performs 5x STS in 22.56 seconds  Time  4    Period  Weeks    Status  Achieved      PT SHORT TERM GOAL #3   Title  Patient will improve gait velocity to >=1.81 ft/sec to demonstrate decr in fall risk    Baseline  Pt gait speed improved to 2.54 ft/sec on 9/17    Time  4    Period  Weeks    Status  Achieved      PT SHORT TERM GOAL #4   Title  Patient will improve Berg score by 4 points to show improved balance    Baseline  Pt Berg score decreased from 40/56 to 39/56 with inability to achieve goal    Time  4    Period  Weeks    Status  Not Met      PT SHORT TERM GOAL #5   Title  Patient will ambulate 400 ft with LRAD modified independently over level indoor surfaces including scanning environment.     Baseline  Pt ambulates 400 ft with rollator and Mod I over uneven & level surfaces; Pt ambulates outdoors over uneven surfaces with supervision requiring VC's for descending curbs with safety consideration to lock rollator for control. Pt requires verbal cues to scan environment     Time  4    Period  Weeks    Status  Partially Met        PT Long Term Goals - 05/05/18 1325      PT LONG TERM GOAL #1   Title  Patient will be independent with updated HEP and able to verbalize plan for continued community-based exercise upon discharge from PT. (Target all LTGs 05/11/2018; extended to 05/29/18 due to late start to treatment after intial evaluation)    Time  8    Period  Weeks    Status  New      PT LONG TERM GOAL #2   Title  Patient will decrease 5x sit to stand to <28 seconds to show decreasing fall risk.     Baseline  --    Time  8    Period  Weeks    Status  New      PT LONG TERM GOAL #3   Title  Patient will improve gait velocity to >=2.5 ft/sec to show decreased fall risk    Baseline  --    Time  8    Period  Weeks    Status  New      PT  LONG TERM GOAL #4   Title  Patient will improve Berg score by 3 points over her score at STG measure (4 week measure)    Baseline  --    Time  8    Period  Weeks    Status  New      PT LONG TERM GOAL #5   Title  Patient will ambulate with LRAD x 500 feet over paved outdoor surfaces modified independent, including up/down curb.     Baseline  --    Time  8    Period  Weeks    Status  New            Plan - 05/05/18 1418    Clinical Impression Statement  Today's session focused on reassessment of short term goals. Pt demonstrates improvement in LE strength & power indicated by her 5x STS time of 22.56 seconds. Pt's demonstrates 1 point decrease in Berg balance assessment indicated by her score of 39/56 placing her  in the category of significant fall risk potential. Pt's gait speed has improved to 2.54 ft/sec using her rollator for assistance. Pt has partially met her goal of ambulation 400 feet with LRAD on level surfaces demonstrated by her ability to perform ambulation Mod I with rollator but continues to require cueing for scanning her environment. Overall, Pt has met 3 STG's, partially met 1 STG, and did not meet 1 STG. Pt will continue to benefit from skilled PT to address dynamic balance, improve strength, and increase functional mobility.      Clinical Presentation  Stable    Rehab Potential  Good    Clinical Impairments Affecting Rehab Potential  cognition, high potential for open foot wounds    PT Frequency  1x / week    PT Duration  8 weeks    PT Treatment/Interventions  ADLs/Self Care Home Management;Gait training;DME Instruction;Stair training;Functional mobility training;Therapeutic activities;Therapeutic exercise;Balance training;Neuromuscular re-education;Cognitive remediation;Manual techniques;Orthotic Fit/Training;Patient/family education;Passive range of motion;Visual/perceptual remediation/compensation    PT Next Visit Plan  have pt try walking in //bars with single UE  support (she desperately wants to walk without rollator)--?try cane if appropriate to work on here; ?Nustep for endurance and LE strengthening    Consulted and Agree with Plan of Care  Patient       Patient will benefit from skilled therapeutic intervention in order to improve the following deficits and impairments:  Abnormal gait, Decreased balance, Decreased cognition, Decreased endurance, Decreased knowledge of use of DME, Decreased strength, Increased edema, Impaired sensation, Impaired vision/preception, Postural dysfunction  Visit Diagnosis: Muscle weakness (generalized)  Other abnormalities of gait and mobility  Other symptoms and signs involving the musculoskeletal system  Abnormal posture     Problem List Patient Active Problem List   Diagnosis Date Noted  . Dyspnea 03/23/2018  . Impaired functional mobility, balance, gait, and endurance 03/02/2018    Class: Diagnosis of  . Abnormality of gait 02/26/2018  . Iron deficiency anemia 03/19/2017  . Vision disturbance 03/20/2015  . Memory difficulty 03/20/2015  . At high risk for falls 11/10/2012  . DIABETIC  RETINOPATHY 03/17/2007  . DIABETIC PERIPHERAL NEUROPATHY 03/17/2007  . Hypothyroidism 10/16/2006  . Uncontrolled type 2 diabetes mellitus with hyperglycemia ([place]) 10/16/2006  . OBESITY, NOS 10/16/2006  . HYPERTENSION, BENIGN SYSTEMIC 10/16/2006  . GASTROESOPHAGEAL REFLUX, NO ESOPHAGITIS 10/16/2006  . Rheumatoid arthritis ([place]) 10/16/2006  . OSTEOPENIA 10/16/2006  . INCONTINENCE, URGE 10/16/2006    Floreen Comber, SPT 05/05/2018, 4:36 PM  [facility] [address] [place], [place], [address] Phone: [phone]   Fax:  [phone]  Name: HENSLEY AZIZ MRN: [id number] Date of Birth: 12-10-1945

## 2018-05-06 DIAGNOSIS — H353221 Exudative age-related macular degeneration, left eye, with active choroidal neovascularization: Secondary | ICD-10-CM | POA: Diagnosis not present

## 2018-05-07 ENCOUNTER — Other Ambulatory Visit (HOSPITAL_COMMUNITY): Payer: Self-pay

## 2018-05-07 ENCOUNTER — Other Ambulatory Visit: Payer: Self-pay | Admitting: Family Medicine

## 2018-05-08 ENCOUNTER — Ambulatory Visit (HOSPITAL_COMMUNITY)
Admission: RE | Admit: 2018-05-08 | Discharge: 2018-05-08 | Disposition: A | Discharge status: Home / Self Care | Payer: Medicare Other | Source: Ambulatory Visit | Attending: Nephrology | Admitting: Nephrology

## 2018-05-08 DIAGNOSIS — D631 Anemia in chronic kidney disease: Secondary | ICD-10-CM | POA: Insufficient documentation

## 2018-05-08 DIAGNOSIS — N189 Chronic kidney disease, unspecified: Secondary | ICD-10-CM | POA: Diagnosis not present

## 2018-05-08 MED ORDER — SODIUM CHLORIDE 0.9 % IV SOLN
300.0000 mg | Freq: Once | INTRAVENOUS | Status: AC
  Administered 2018-05-08: 300 mg via INTRAVENOUS
  Filled 2018-05-08: qty 15

## 2018-05-15 ENCOUNTER — Encounter: Payer: Self-pay | Admitting: Physical Therapy

## 2018-05-15 ENCOUNTER — Ambulatory Visit: Payer: Medicare Other | Admitting: Physical Therapy

## 2018-05-15 DIAGNOSIS — R2689 Other abnormalities of gait and mobility: Secondary | ICD-10-CM

## 2018-05-15 DIAGNOSIS — R29898 Other symptoms and signs involving the musculoskeletal system: Secondary | ICD-10-CM

## 2018-05-15 DIAGNOSIS — M6281 Muscle weakness (generalized): Secondary | ICD-10-CM | POA: Diagnosis not present

## 2018-05-15 DIAGNOSIS — R293 Abnormal posture: Secondary | ICD-10-CM | POA: Diagnosis not present

## 2018-05-16 NOTE — Therapy (Signed)
[facility] [address] [place], [place], [address] Phone: [phone]   Fax:  [phone]  Physical Therapy Treatment  Patient Details  Name: [name] MRN: [id number] Date of Birth: 1945/11/07 Referring Provider (PT): McDiarmid, Blane Ohara, MD   Encounter Date: 05/15/2018  PT End of Session - 05/15/18 1700    Visit Number  6    Number of Visits  9    Date for PT Re-Evaluation  05/11/18    Authorization Type  [employer] Medicare    Authorization Time Period  03/12/18 to 06/10/2018    PT Start Time  1531    PT Stop Time  1615    PT Time Calculation (min)  44 min    Activity Tolerance  Patient tolerated treatment well    Behavior During Therapy  [facility] for tasks assessed/performed       Past Medical History:  Diagnosis Date  . Bilateral lower extremity edema 06/06/2014  . Cataracts, bilateral 02/13/2011   Seen on eye exam 02/07/11. F/u in 12 months   . Diabetes mellitus age 47  . Diabetic ulcer of toe ([place]) 12/12/2015  . Hyperlipidemia   . Hypertension   . Retinal detachment, old, partial    left  . Retinopathy due to secondary diabetes mellitus ([place])    L>R, laser 3/07  . Schizotypal personality disorder ([place])   . Thyroid disease   . Weight loss 01/22/2012    Past Surgical History:  Procedure Laterality Date  . BREAST EXCISIONAL BIOPSY    . BREAST SURGERY    . CATARACT EXTRACTION     left   . ROTATOR CUFF REPAIR  1990's   left  . Thyroid radiation ablation     for Graves Disease  . TRANSTHORACIC ECHOCARDIOGRAM      EF55-65%, nml - 12/14/2004    There were no vitals filed for this visit.  Subjective Assessment - 05/15/18 1532    Subjective  Reports she had to have an iron infusion this week. Is feeling exhausted. Reports she has an appt with her doctor 11/30 for her swollen legs.     Pertinent History  DM, neuropathy, retinopathy, HTN, GERD, RA, osteopenia, urge incontinence, Schizotypal personality disorder      Limitations  Standing;Walking    Patient Stated Goals  to walk without the rollator; to improve her posture and "not end up all bent over like so many old people"    Currently in Pain?  No/denies                       [facility] Adult PT Treatment/Exercise - 05/15/18 1700      Ambulation/Gait   Ambulation/Gait Assistance  4: Min assist;6: Modified independent (Device/Increase time)    Ambulation/Gait Assistance Details  steadying assist when LOB     Ambulation Distance (Feet)  120 Feet   x 3   Assistive device  Straight cane;Parallel bars;Rollator    Gait Pattern  Step-through pattern;Decreased stride length;Decreased dorsiflexion - right;Decreased dorsiflexion - left;Right foot flat;Left foot flat;Trunk flexed;Poor foot clearance - left;Poor foot clearance - right    Ambulation Surface  Indoor    Ramp  5: Supervision    Ramp Details (indicate cue type and reason)  pt recalled technique instructed in previously (lock back wheels and slowly push rollator down ramp)      Exercises   Exercises  Knee/Hip      Knee/Hip Exercises: Aerobic   Nustep  L3  x 2 minutes warm-up; L6 x 4 minutes focusing on using LEs more than UEs, 1 minute cool-down           Balance Exercises - 05/15/18 1700      Balance Exercises: Standing   Step Ups  Forward;4 inch;6 inch;UE support 2   10 leading with each foot   Tandem Gait  Forward;Upper extremity support   // bars   Retro Gait  Upper extremity support   oneUE on one // bar   Sidestepping  Upper extremity support;4 reps    Marching Limitations  single UE support on // bar          PT Short Term Goals - 05/05/18 1325      PT SHORT TERM GOAL #1   Title  Patient will be independent with basic HEP (Target for all STGs 04/11/2018; defer to 05/04/18 due to missed appts)    Time  4    Period  Weeks    Status  Achieved      PT SHORT TERM GOAL #2   Title  Patient will decrease 5x sit to stand to < 35 seconds to demonstrate decr in  fall risk.    Baseline  Pt performs 5x STS in 22.56 seconds     Time  4    Period  Weeks    Status  Achieved      PT SHORT TERM GOAL #3   Title  Patient will improve gait velocity to >=1.81 ft/sec to demonstrate decr in fall risk    Baseline  Pt gait speed improved to 2.54 ft/sec on 9/17    Time  4    Period  Weeks    Status  Achieved      PT SHORT TERM GOAL #4   Title  Patient will improve Berg score by 4 points to show improved balance    Baseline  Pt Berg score decreased from 40/56 to 39/56 with inability to achieve goal    Time  4    Period  Weeks    Status  Not Met      PT SHORT TERM GOAL #5   Title  Patient will ambulate 400 ft with LRAD modified independently over level indoor surfaces including scanning environment.     Baseline  Pt ambulates 400 ft with rollator and Mod I over uneven & level surfaces; Pt ambulates outdoors over uneven surfaces with supervision requiring VC's for descending curbs with safety consideration to lock rollator for control. Pt requires verbal cues to scan environment     Time  4    Period  Weeks    Status  Partially Met        PT Long Term Goals - 05/05/18 1325      PT LONG TERM GOAL #1   Title  Patient will be independent with updated HEP and able to verbalize plan for continued community-based exercise upon discharge from PT. (Target all LTGs 05/11/2018; extended to 05/29/18 due to late start to treatment after intial evaluation)    Time  8    Period  Weeks    Status  New      PT LONG TERM GOAL #2   Title  Patient will decrease 5x sit to stand to <28 seconds to show decreasing fall risk.     Baseline  --    Time  8    Period  Weeks    Status  New      PT LONG TERM  GOAL #3   Title  Patient will improve gait velocity to >=2.5 ft/sec to show decreased fall risk    Baseline  --    Time  8    Period  Weeks    Status  New      PT LONG TERM GOAL #4   Title  Patient will improve Berg score by 3 points over her score at STG measure (4  week measure)    Baseline  --    Time  8    Period  Weeks    Status  New      PT LONG TERM GOAL #5   Title  Patient will ambulate with LRAD x 500 feet over paved outdoor surfaces modified independent, including up/down curb.     Baseline  --    Time  8    Period  Weeks    Status  New            Plan - 05/16/18 1442    Clinical Impression Statement  Session focused on gait training (including trial with single UE support in parallel bars, progressing to [facility]). Patient noted she is more safe with rollator as it "prepares" her for what she is about to encounter in her path--especially since her eyesight has worsened. "If the sidewalk is uneven, the wheels of the rollator go over it first and warn me." She decided she no longer thinks it would be a good idea to walk with a cane again. She continues to get injections in her eyes and hopes her sight will improve and perhaps one day be able to walk safely with a cane.     Rehab Potential  Good    Clinical Impairments Affecting Rehab Potential  cognition, high potential for open foot wounds    PT Frequency  1x / week    PT Duration  8 weeks    PT Treatment/Interventions  ADLs/Self Care Home Management;Gait training;DME Instruction;Stair training;Functional mobility training;Therapeutic activities;Therapeutic exercise;Balance training;Neuromuscular re-education;Cognitive remediation;Manual techniques;Orthotic Fit/Training;Patient/family education;Passive range of motion;Visual/perceptual remediation/compensation    PT Next Visit Plan  discuss further goals or will plan to wrap up PT early; continue LE strengthening and balance; walking outdoors (including slopes, curb), Nustep for endurance    Consulted and Agree with Plan of Care  Patient       Patient will benefit from skilled therapeutic intervention in order to improve the following deficits and impairments:  Abnormal gait, Decreased balance, Decreased cognition, Decreased endurance,  Decreased knowledge of use of DME, Decreased strength, Increased edema, Impaired sensation, Impaired vision/preception, Postural dysfunction  Visit Diagnosis: Muscle weakness (generalized)  Other abnormalities of gait and mobility  Other symptoms and signs involving the musculoskeletal system  Abnormal posture     Problem List Patient Active Problem List   Diagnosis Date Noted  . Dyspnea 03/23/2018  . Impaired functional mobility, balance, gait, and endurance 03/02/2018    Class: Diagnosis of  . Abnormality of gait 02/26/2018  . Iron deficiency anemia 03/19/2017  . Vision disturbance 03/20/2015  . Memory difficulty 03/20/2015  . At high risk for falls 11/10/2012  . DIABETIC  RETINOPATHY 03/17/2007  . DIABETIC PERIPHERAL NEUROPATHY 03/17/2007  . Hypothyroidism 10/16/2006  . Uncontrolled type 2 diabetes mellitus with hyperglycemia ([place]) 10/16/2006  . OBESITY, NOS 10/16/2006  . HYPERTENSION, BENIGN SYSTEMIC 10/16/2006  . GASTROESOPHAGEAL REFLUX, NO ESOPHAGITIS 10/16/2006  . Rheumatoid arthritis ([place]) 10/16/2006  . OSTEOPENIA 10/16/2006  . INCONTINENCE, URGE 10/16/2006    Rexanne Mano, PT 05/16/2018,  3:02 PM  [facility] [address] [place], [place], [address] Phone: [phone]   Fax:  [phone]  Name: [name] MRN: [id number] Date of Birth: [birth year]

## 2018-05-18 ENCOUNTER — Other Ambulatory Visit (HOSPITAL_COMMUNITY): Payer: Self-pay | Admitting: *Deleted

## 2018-05-19 ENCOUNTER — Ambulatory Visit (HOSPITAL_COMMUNITY)
Admission: RE | Admit: 2018-05-19 | Discharge: 2018-05-19 | Disposition: A | Discharge status: Home / Self Care | Payer: Medicare Other | Source: Ambulatory Visit | Attending: Nephrology | Admitting: Nephrology

## 2018-05-19 VITALS — BP 145/65 | HR 85 | Temp 97.5°F | Resp 20

## 2018-05-19 DIAGNOSIS — N183 Chronic kidney disease, stage 3 (moderate): Secondary | ICD-10-CM | POA: Insufficient documentation

## 2018-05-19 DIAGNOSIS — D631 Anemia in chronic kidney disease: Secondary | ICD-10-CM | POA: Insufficient documentation

## 2018-05-19 DIAGNOSIS — D508 Other iron deficiency anemias: Secondary | ICD-10-CM

## 2018-05-19 LAB — POCT HEMOGLOBIN-HEMACUE: HEMOGLOBIN: 9.2 g/dL — AB (ref 12.0–15.0)

## 2018-05-19 MED ORDER — EPOETIN ALFA-EPBX 40000 UNIT/ML IJ SOLN: 30000.0000 [IU] | INTRAMUSCULAR | Status: DC

## 2018-05-19 MED ORDER — EPOETIN ALFA-EPBX 10000 UNIT/ML IJ SOLN
30000.0000 [IU] | Freq: Once | INTRAMUSCULAR | Status: AC
  Administered 2018-05-19: 30000 [IU] via SUBCUTANEOUS
  Filled 2018-05-19: qty 3

## 2018-05-22 ENCOUNTER — Ambulatory Visit: Payer: Medicare Other | Attending: Audiology

## 2018-05-22 DIAGNOSIS — R2689 Other abnormalities of gait and mobility: Secondary | ICD-10-CM | POA: Diagnosis not present

## 2018-05-22 DIAGNOSIS — R29898 Other symptoms and signs involving the musculoskeletal system: Secondary | ICD-10-CM | POA: Diagnosis not present

## 2018-05-22 DIAGNOSIS — R293 Abnormal posture: Secondary | ICD-10-CM | POA: Insufficient documentation

## 2018-05-22 DIAGNOSIS — M6281 Muscle weakness (generalized): Secondary | ICD-10-CM | POA: Diagnosis not present

## 2018-05-23 NOTE — Therapy (Signed)
[facility] [address] [place], [place], [address] Phone: [phone]   Fax:  [phone]  Physical Therapy Treatment  Patient Details  Name: [name] MRN: [id number] Date of Birth: 02/17/1946 Referring Provider (PT): McDiarmid, Todd D, MD   Encounter Date: 05/22/2018  PT End of Session - 05/22/18 1501    Visit Number  7    Number of Visits  9    Date for PT Re-Evaluation  05/11/18    Authorization Type  [employer] Medicare    Authorization Time Period  03/12/18 to 06/10/2018    PT Start Time  1453    PT Stop Time  1530    PT Time Calculation (min)  37 min    Activity Tolerance  Patient tolerated treatment well    Behavior During Therapy  WFL for tasks assessed/performed       Past Medical History:  Diagnosis Date  . Bilateral lower extremity edema 06/06/2014  . Cataracts, bilateral 02/13/2011   Seen on eye exam 02/07/11. F/u in 12 months   . Diabetes mellitus age 28  . Diabetic ulcer of toe (HCC) 12/12/2015  . Hyperlipidemia   . Hypertension   . Retinal detachment, old, partial    left  . Retinopathy due to secondary diabetes mellitus (HCC)    L>R, laser 3/07  . Schizotypal personality disorder (HCC)   . Thyroid disease   . Weight loss 01/22/2012    Past Surgical History:  Procedure Laterality Date  . BREAST EXCISIONAL BIOPSY    . BREAST SURGERY    . CATARACT EXTRACTION     left   . ROTATOR CUFF REPAIR  1990's   left  . Thyroid radiation ablation     for Graves Disease  . TRANSTHORACIC ECHOCARDIOGRAM      EF55-65%, nml - 12/14/2004    There were no vitals filed for this visit.  Subjective Assessment - 05/22/18 1456    Subjective  Almost fell pushing garbage can to house, hit a stump and garbage can got away from her.     Pertinent History  DM, neuropathy, retinopathy, HTN, GERD, RA, osteopenia, urge incontinence, Schizotypal personality disorder     Limitations  Standing;Walking    Patient Stated  Goals  to walk without the rollator; to improve her posture and "not end up all bent over like so many old people"    Currently in Pain?  No/denies         [facility] Adult PT Treatment/Exercise - 05/22/18 1502      Ambulation/Gait   Ambulation/Gait  Yes    Ambulation/Gait Assistance  5: Supervision;4: Min guard    Ambulation/Gait Assistance Details  Lowered pts handles on rollator to proper height, Min guard while negotiating grass, supervision on pavement, VC's for proper use of AD, upright posture, and step length.     Ambulation Distance (Feet)  500 Feet   115   Assistive device  Rollator    Gait Pattern  Step-through pattern;Decreased stride length;Decreased dorsiflexion - right;Decreased dorsiflexion - left;Right foot flat;Left foot flat;Trunk flexed;Poor foot clearance - left;Poor foot clearance - right    Ambulation Surface  Level;Unlevel;Indoor;Outdoor;Grass    Ramp  5: Supervision    Ramp Details (indicate cue type and reason)  Pt demonstrated proper technique with weightshift/use of AD.     Curb  5: Supervision    Curb Details (indicate cue type and reason)  VC's for proper technique with use of AD while ascending/descending curb.         High Level Balance   High Level Balance Activities  Side stepping;Backward walking;Tandem walking;Marching forwards;Marching backwards    High Level Balance Comments  On red mats with SUE support, LOB noted during bkwds activities with pt correction and tactile cues for feedback. Min guard for safety and VC's for posture and proper technique.          PT Short Term Goals - 05/05/18 1325      PT SHORT TERM GOAL #1   Title  Patient will be independent with basic HEP (Target for all STGs 04/11/2018; defer to 05/04/18 due to missed appts)    Time  4    Period  Weeks    Status  Achieved      PT SHORT TERM GOAL #2   Title  Patient will decrease 5x sit to stand to < 35 seconds to demonstrate decr in fall risk.    Baseline  Pt performs 5x STS in  22.56 seconds     Time  4    Period  Weeks    Status  Achieved      PT SHORT TERM GOAL #3   Title  Patient will improve gait velocity to >=1.81 ft/sec to demonstrate decr in fall risk    Baseline  Pt gait speed improved to 2.54 ft/sec on 9/17    Time  4    Period  Weeks    Status  Achieved      PT SHORT TERM GOAL #4   Title  Patient will improve Berg score by 4 points to show improved balance    Baseline  Pt Berg score decreased from 40/56 to 39/56 with inability to achieve goal    Time  4    Period  Weeks    Status  Not Met      PT SHORT TERM GOAL #5   Title  Patient will ambulate 400 ft with LRAD modified independently over level indoor surfaces including scanning environment.     Baseline  Pt ambulates 400 ft with rollator and Mod I over uneven & level surfaces; Pt ambulates outdoors over uneven surfaces with supervision requiring VC's for descending curbs with safety consideration to lock rollator for control. Pt requires verbal cues to scan environment     Time  4    Period  Weeks    Status  Partially Met        PT Long Term Goals - 05/05/18 1325      PT LONG TERM GOAL #1   Title  Patient will be independent with updated HEP and able to verbalize plan for continued community-based exercise upon discharge from PT. (Target all LTGs 05/11/2018; extended to 05/29/18 due to late start to treatment after intial evaluation)    Time  8    Period  Weeks    Status  New      PT LONG TERM GOAL #2   Title  Patient will decrease 5x sit to stand to <28 seconds to show decreasing fall risk.     Baseline  --    Time  8    Period  Weeks    Status  New      PT LONG TERM GOAL #3   Title  Patient will improve gait velocity to >=2.5 ft/sec to show decreased fall risk    Baseline  --    Time  8    Period  Weeks    Status  New      PT LONG TERM   GOAL #4   Title  Patient will improve Berg score by 3 points over her score at STG measure (4 week measure)    Baseline  --    Time  8     Period  Weeks    Status  New      PT LONG TERM GOAL #5   Title  Patient will ambulate with LRAD x 500 feet over paved outdoor surfaces modified independent, including up/down curb.     Baseline  --    Time  8    Period  Weeks    Status  New            Plan - 05/23/18 2028    Clinical Impression Statement  Todays skilled session focused on gait training with rollator walker outdoors on pavement/grass while negotiating ramps/curbs and high level balance on compliant surfaces to increase stabilty. Pt should benefit from PT session to continue to progress towards unmet goals.     Rehab Potential  Good    Clinical Impairments Affecting Rehab Potential  cognition, high potential for open foot wounds    PT Frequency  1x / week    PT Duration  8 weeks    PT Treatment/Interventions  ADLs/Self Care Home Management;Gait training;DME Instruction;Stair training;Functional mobility training;Therapeutic activities;Therapeutic exercise;Balance training;Neuromuscular re-education;Cognitive remediation;Manual techniques;Orthotic Fit/Training;Patient/family education;Passive range of motion;Visual/perceptual remediation/compensation    PT Next Visit Plan  discuss further goals or will plan to wrap up PT early; continue LE strengthening and balance; walking outdoors (including slopes, curb), Nustep for endurance    Consulted and Agree with Plan of Care  Patient       Patient will benefit from skilled therapeutic intervention in order to improve the following deficits and impairments:  Abnormal gait, Decreased balance, Decreased cognition, Decreased endurance, Decreased knowledge of use of DME, Decreased strength, Increased edema, Impaired sensation, Impaired vision/preception, Postural dysfunction  Visit Diagnosis: Muscle weakness (generalized)  Other abnormalities of gait and mobility  Other symptoms and signs involving the musculoskeletal system  Abnormal posture     Problem List Patient  Active Problem List   Diagnosis Date Noted  . Dyspnea 03/23/2018  . Impaired functional mobility, balance, gait, and endurance 03/02/2018    Class: Diagnosis of  . Abnormality of gait 02/26/2018  . Iron deficiency anemia 03/19/2017  . Vision disturbance 03/20/2015  . Memory difficulty 03/20/2015  . At high risk for falls 11/10/2012  . DIABETIC  RETINOPATHY 03/17/2007  . DIABETIC PERIPHERAL NEUROPATHY 03/17/2007  . Hypothyroidism 10/16/2006  . Uncontrolled type 2 diabetes mellitus with hyperglycemia ([place]) 10/16/2006  . OBESITY, NOS 10/16/2006  . HYPERTENSION, BENIGN SYSTEMIC 10/16/2006  . GASTROESOPHAGEAL REFLUX, NO ESOPHAGITIS 10/16/2006  . Rheumatoid arthritis ([place]) 10/16/2006  . OSTEOPENIA 10/16/2006  . INCONTINENCE, URGE 10/16/2006   Micheline Markes, PTA  Haley Roza A Luiscarlos Kaczmarczyk 05/23/2018, 8:34 PM  [facility] [address] [place], [place], [address] Phone: [phone]   Fax:  [phone]  Name: GESSICA JAWAD MRN: [id number] Date of Birth: [birth year]

## 2018-05-29 ENCOUNTER — Ambulatory Visit: Payer: Medicare Other | Admitting: Physical Therapy

## 2018-05-29 ENCOUNTER — Encounter: Payer: Self-pay | Admitting: Physical Therapy

## 2018-05-29 DIAGNOSIS — R293 Abnormal posture: Secondary | ICD-10-CM | POA: Diagnosis not present

## 2018-05-29 DIAGNOSIS — R2689 Other abnormalities of gait and mobility: Secondary | ICD-10-CM | POA: Diagnosis not present

## 2018-05-29 DIAGNOSIS — M6281 Muscle weakness (generalized): Secondary | ICD-10-CM | POA: Diagnosis not present

## 2018-05-29 DIAGNOSIS — R29898 Other symptoms and signs involving the musculoskeletal system: Secondary | ICD-10-CM | POA: Diagnosis not present

## 2018-05-29 NOTE — Therapy (Signed)
[facility] [address] [place], [place], [address] Phone: [phone]   Fax:  [phone]  Physical Therapy Treatment and Discharge Summary  Patient Details  Name: [name] MRN: [id number] Date of Birth: 09/26/1945 Referring Provider (PT): McDiarmid, Blane Ohara, MD   Encounter Date: 05/29/2018  PT End of Session - 05/29/18 1601    Visit Number  8    Number of Visits  9    Date for PT Re-Evaluation  05/11/18    Authorization Type  [employer] Medicare    Authorization Time Period  03/12/18 to 06/10/2018    PT Start Time  1446    PT Stop Time  1530    PT Time Calculation (min)  44 min    Activity Tolerance  Patient tolerated treatment well    Behavior During Therapy  [facility] for tasks assessed/performed       Past Medical History:  Diagnosis Date  . Bilateral lower extremity edema 06/06/2014  . Cataracts, bilateral 02/13/2011   Seen on eye exam 02/07/11. F/u in 12 months   . Diabetes mellitus age 29  . Diabetic ulcer of toe ([place]) 12/12/2015  . Hyperlipidemia   . Hypertension   . Retinal detachment, old, partial    left  . Retinopathy due to secondary diabetes mellitus ([place])    L>R, laser 3/07  . Schizotypal personality disorder ([place])   . Thyroid disease   . Weight loss 01/22/2012    Past Surgical History:  Procedure Laterality Date  . BREAST EXCISIONAL BIOPSY    . BREAST SURGERY    . CATARACT EXTRACTION     left   . ROTATOR CUFF REPAIR  1990's   left  . Thyroid radiation ablation     for Graves Disease  . TRANSTHORACIC ECHOCARDIOGRAM      EF55-65%, nml - 12/14/2004    There were no vitals filed for this visit.  Subjective Assessment - 05/29/18 1452    Subjective  No falls.     Pertinent History  DM, neuropathy, retinopathy, HTN, GERD, RA, osteopenia, urge incontinence, Schizotypal personality disorder     Limitations  Standing;Walking    Patient Stated Goals  to walk without the rollator; to improve her  posture and "not end up all bent over like so many old people"                       [facility] Adult PT Treatment/Exercise - 05/29/18 1457      Transfers   Transfers  Sit to Stand;Stand to Sit    Five time sit to stand comments   11.53      Ambulation/Gait   Ambulation/Gait Assistance  6: Modified independent (Device/Increase time)    Ambulation Distance (Feet)  500 Feet    Assistive device  Rollator    Gait Pattern  Step-through pattern;Decreased dorsiflexion - right;Decreased dorsiflexion - left    Ambulation Surface  Level;Indoor;Outdoor;Paved    Gait velocity  20/7.84=2.55    Curb  6: Modified independent (Device/increase time)    Curb Details (indicate cue type and reason)  with rollator      Berg Balance Test   Sit to Stand  Able to stand without using hands and stabilize independently    Standing Unsupported  Able to stand safely 2 minutes    Sitting with Back Unsupported but Feet Supported on Floor or Stool  Able to sit safely and securely 2 minutes    Stand to  Sit  Sits safely with minimal use of hands    Transfers  Able to transfer safely, minor use of hands    Standing Unsupported with Eyes Closed  Able to stand 10 seconds safely    Standing Ubsupported with Feet Together  Able to place feet together independently and stand 1 minute safely    From Standing, Reach Forward with Outstretched Arm  Can reach confidently >25 cm (10")    From Standing Position, Pick up Object from Floor  Able to pick up shoe safely and easily    From Standing Position, Turn to Look Behind Over each Shoulder  Looks behind from both sides and weight shifts well    Turn 360 Degrees  Able to turn 360 degrees safely but slowly    Standing Unsupported, Alternately Place Feet on Step/Stool  Able to complete >2 steps/needs minimal assist    Standing Unsupported, One Foot in Front  Able to plae foot ahead of the other independently and hold 30 seconds    Standing on One Leg  Tries to lift  leg/unable to hold 3 seconds but remains standing independently    Total Score  47          Balance Exercises - 05/29/18 1555      Balance Exercises: Standing   Retro Gait  Foam/compliant surface;Upper extremity support    Sidestepping  Foam/compliant support;Upper extremity support    Marching Limitations  on blue mat, UE support        PT Education - 05/29/18 1600    Education Details  provided handout re: [facility] and appropriate free exercise classes    Person(s) Educated  Patient    Methods  Explanation;Handout    Comprehension  Verbalized understanding       PT Short Term Goals - 05/05/18 1325      PT SHORT TERM GOAL #1   Title  Patient will be independent with basic HEP (Target for all STGs 04/11/2018; defer to 05/04/18 due to missed appts)    Time  4    Period  Weeks    Status  Achieved      PT SHORT TERM GOAL #2   Title  Patient will decrease 5x sit to stand to < 35 seconds to demonstrate decr in fall risk.    Baseline  Pt performs 5x STS in 22.56 seconds     Time  4    Period  Weeks    Status  Achieved      PT SHORT TERM GOAL #3   Title  Patient will improve gait velocity to >=1.81 ft/sec to demonstrate decr in fall risk    Baseline  Pt gait speed improved to 2.54 ft/sec on 9/17    Time  4    Period  Weeks    Status  Achieved      PT SHORT TERM GOAL #4   Title  Patient will improve Berg score by 4 points to show improved balance    Baseline  Pt Berg score decreased from 40/56 to 39/56 with inability to achieve goal    Time  4    Period  Weeks    Status  Not Met      PT SHORT TERM GOAL #5   Title  Patient will ambulate 400 ft with LRAD modified independently over level indoor surfaces including scanning environment.     Baseline  Pt ambulates 400 ft with rollator and Mod I over uneven & level surfaces;  Pt ambulates outdoors over uneven surfaces with supervision requiring VC's for descending curbs with safety consideration to lock rollator  for control. Pt requires verbal cues to scan environment     Time  4    Period  Weeks    Status  Partially Met        PT Long Term Goals - 05/29/18 1453      PT LONG TERM GOAL #1   Title  Patient will be independent with updated HEP and able to verbalize plan for continued community-based exercise upon discharge from PT. (Target all LTGs 05/11/2018; extended to 05/29/18 due to late start to treatment after intial evaluation)    Baseline  05/29/18 Reports doing [place] 3x/week    Time  8    Period  Weeks    Status  Achieved      PT LONG TERM GOAL #2   Title  Patient will decrease 5x sit to stand to <28 seconds to show decreasing fall risk.     Baseline  05/29/18 11.53 sec    Time  8    Period  Weeks    Status  Achieved      PT LONG TERM GOAL #3   Title  Patient will improve gait velocity to >=2.5 ft/sec to show decreased fall risk    Baseline  05/29/18  2.55 ft/sec    Time  8    Period  Weeks    Status  Achieved      PT LONG TERM GOAL #4   Title  Patient will improve Berg score by 3 points over her score at STG measure (4 week measure)    Baseline  05/29/18  47/56    Time  8    Period  Weeks    Status  Achieved      PT LONG TERM GOAL #5   Title  Patient will ambulate with LRAD x 500 feet over paved outdoor surfaces modified independent, including up/down curb.     Time  8    Period  Weeks    Status  Achieved            Plan - 05/29/18 1603    Clinical Impression Statement  LTGs assessed with pt meeting 5 of 5 goals! She reports she has definitely seen an improvement in her balance and walking (states she even felt comfortable "taking a short cut across the grass with my rollator"). Patient pleased with progress and verbalizes understanding that she needs to continue to do her exercises to continue to improve. Discharge from PT.     Rehab Potential  Good    Clinical Impairments Affecting Rehab Potential  cognition, high potential for open foot wounds    PT Frequency   1x / week    PT Duration  8 weeks    PT Treatment/Interventions  ADLs/Self Care Home Management;Gait training;DME Instruction;Stair training;Functional mobility training;Therapeutic activities;Therapeutic exercise;Balance training;Neuromuscular re-education;Cognitive remediation;Manual techniques;Orthotic Fit/Training;Patient/family education;Passive range of motion;Visual/perceptual remediation/compensation    Consulted and Agree with Plan of Care  Patient       Patient will benefit from skilled therapeutic intervention in order to improve the following deficits and impairments:  Abnormal gait, Decreased balance, Decreased cognition, Decreased endurance, Decreased knowledge of use of DME, Decreased strength, Increased edema, Impaired sensation, Impaired vision/preception, Postural dysfunction  Visit Diagnosis: Muscle weakness (generalized)  Other abnormalities of gait and mobility     Problem List Patient Active Problem List   Diagnosis Date Noted  . Dyspnea 03/23/2018  .  Impaired functional mobility, balance, gait, and endurance 03/02/2018    Class: Diagnosis of  . Abnormality of gait 02/26/2018  . Iron deficiency anemia 03/19/2017  . Vision disturbance 03/20/2015  . Memory difficulty 03/20/2015  . At high risk for falls 11/10/2012  . DIABETIC  RETINOPATHY 03/17/2007  . DIABETIC PERIPHERAL NEUROPATHY 03/17/2007  . Hypothyroidism 10/16/2006  . Uncontrolled type 2 diabetes mellitus with hyperglycemia ([place]) 10/16/2006  . OBESITY, NOS 10/16/2006  . HYPERTENSION, BENIGN SYSTEMIC 10/16/2006  . GASTROESOPHAGEAL REFLUX, NO ESOPHAGITIS 10/16/2006  . Rheumatoid arthritis ([place]) 10/16/2006  . OSTEOPENIA 10/16/2006  . INCONTINENCE, URGE 10/16/2006   PHYSICAL THERAPY DISCHARGE SUMMARY  Visits from Start of Care: 8  Current functional level related to goals / functional outcomes: See LTGs above   Remaining deficits: Due to numbness bil feet, balance remains decreased   Education /  Equipment: HEP, proper use of rollator  Plan: Patient agrees to discharge.  Patient goals were met. Patient is being discharged due to meeting the stated rehab goals.  ?????       Rexanne Mano, PT 05/29/2018, 4:06 PM  [facility] [address] [place], [place], [address] Phone: [phone]   Fax:  [phone]  Name: [name] MRN: [id number] Date of Birth: 12/03/1945

## 2018-06-08 ENCOUNTER — Encounter: Payer: Medicare Other | Admitting: Physical Therapy

## 2018-06-09 ENCOUNTER — Inpatient Hospital Stay (HOSPITAL_COMMUNITY): Admission: RE | Admit: 2018-06-09 | Payer: Medicare Other | Source: Ambulatory Visit

## 2018-06-09 ENCOUNTER — Ambulatory Visit: Payer: Medicare Other | Admitting: Podiatry

## 2018-06-12 ENCOUNTER — Ambulatory Visit: Payer: Medicare Other | Admitting: Podiatry

## 2018-06-12 ENCOUNTER — Encounter: Payer: Self-pay | Admitting: Podiatry

## 2018-06-12 DIAGNOSIS — M79676 Pain in unspecified toe(s): Secondary | ICD-10-CM

## 2018-06-12 DIAGNOSIS — B351 Tinea unguium: Secondary | ICD-10-CM | POA: Diagnosis not present

## 2018-06-12 DIAGNOSIS — E1159 Type 2 diabetes mellitus with other circulatory complications: Secondary | ICD-10-CM

## 2018-06-12 DIAGNOSIS — M79609 Pain in unspecified limb: Principal | ICD-10-CM

## 2018-06-12 NOTE — Progress Notes (Signed)
Patient ID: [name], female   DOB: 08-06-1946, 72 y.o.   MRN: [id number] Complaint:  Visit Type: Patient presents  to my office for  preventative foot care services. Complaint: Patient states" my nails have grown long and thick and become painful to walk and wear shoes" Patient has been diagnosed with DM with neuropathy.. The patient presents for preventative foot care services. No changes to ROS.  No evidence of blisters/ulcers.  Podiatric Exam: Vascular: dorsalis pedis and posterior tibial pulses are not  palpable bilateral due to swollen feet/legs. Capillary return is immediate. Temperature gradient is WNL.   Sensorium: Normal Semmes Weinstein monofilament test. Normal tactile sensation bilaterally. Nail Exam: Pt has thick disfigured discolored nails with subungual debris noted bilateral entire nail hallux through second toes both feet.; Ulcer Exam: There is no evidence of ulcer or pre-ulcerative changes or infection. Orthopedic Exam: Muscle tone and strength are WNL. No limitations in general ROM. No crepitus or effusions noted. Arthritis  IPJ right hallux.  HAV 1st MPJ  Left foot.Unknown soft tissue mass left arch. Asymptomatic. Skin: No Porokeratosis. No infection or ulcers,      Diagnosis:  Onychomycosis, , Pain in right toe, pain in left toes,  Diabetes with angiopathy and neurology.  Treatment & Plan Procedures and Treatment: Consent by patient was obtained for treatment procedures. The patient understood the discussion of treatment and procedures well. All questions were answered thoroughly reviewed. Debridement of mycotic and hypertrophic toenails, 1 through 5 bilateral and clearing of subungual debris. No ulceration, no infection noted.  Possible cellulitis due to venous stasis lateral malleoli right foot.  To consider evaluation for her vascular status.    Return Visit-Office Procedure: Patient instructed to return to the office for a follow up visit 3 months.   Gardiner Barefoot  DPM

## 2018-06-16 ENCOUNTER — Other Ambulatory Visit: Payer: Self-pay

## 2018-06-16 ENCOUNTER — Encounter (HOSPITAL_COMMUNITY): Payer: Self-pay | Admitting: Emergency Medicine

## 2018-06-16 ENCOUNTER — Emergency Department (HOSPITAL_COMMUNITY): Payer: Medicare Other

## 2018-06-16 ENCOUNTER — Encounter (HOSPITAL_COMMUNITY): Payer: Medicare Other

## 2018-06-16 ENCOUNTER — Emergency Department (HOSPITAL_COMMUNITY)
Admission: EM | Admit: 2018-06-16 | Discharge: 2018-06-16 | Disposition: A | Discharge status: Home / Self Care | Payer: Medicare Other | Attending: Emergency Medicine | Admitting: Emergency Medicine

## 2018-06-16 DIAGNOSIS — R42 Dizziness and giddiness: Secondary | ICD-10-CM | POA: Diagnosis not present

## 2018-06-16 DIAGNOSIS — R58 Hemorrhage, not elsewhere classified: Secondary | ICD-10-CM | POA: Diagnosis not present

## 2018-06-16 DIAGNOSIS — S81811A Laceration without foreign body, right lower leg, initial encounter: Secondary | ICD-10-CM | POA: Diagnosis not present

## 2018-06-16 DIAGNOSIS — W19XXXA Unspecified fall, initial encounter: Secondary | ICD-10-CM | POA: Diagnosis not present

## 2018-06-16 DIAGNOSIS — I10 Essential (primary) hypertension: Secondary | ICD-10-CM | POA: Insufficient documentation

## 2018-06-16 DIAGNOSIS — E039 Hypothyroidism, unspecified: Secondary | ICD-10-CM | POA: Diagnosis not present

## 2018-06-16 DIAGNOSIS — Y939 Activity, unspecified: Secondary | ICD-10-CM | POA: Diagnosis not present

## 2018-06-16 DIAGNOSIS — Y999 Unspecified external cause status: Secondary | ICD-10-CM | POA: Diagnosis not present

## 2018-06-16 DIAGNOSIS — Z79899 Other long term (current) drug therapy: Secondary | ICD-10-CM | POA: Diagnosis not present

## 2018-06-16 DIAGNOSIS — Y92009 Unspecified place in unspecified non-institutional (private) residence as the place of occurrence of the external cause: Secondary | ICD-10-CM | POA: Diagnosis not present

## 2018-06-16 DIAGNOSIS — E119 Type 2 diabetes mellitus without complications: Secondary | ICD-10-CM | POA: Diagnosis not present

## 2018-06-16 DIAGNOSIS — M7989 Other specified soft tissue disorders: Secondary | ICD-10-CM | POA: Diagnosis not present

## 2018-06-16 DIAGNOSIS — R52 Pain, unspecified: Secondary | ICD-10-CM

## 2018-06-16 DIAGNOSIS — R609 Edema, unspecified: Secondary | ICD-10-CM | POA: Diagnosis not present

## 2018-06-16 DIAGNOSIS — S81812A Laceration without foreign body, left lower leg, initial encounter: Secondary | ICD-10-CM | POA: Diagnosis not present

## 2018-06-16 MED ORDER — LIDOCAINE-EPINEPHRINE (PF) 2 %-1:200000 IJ SOLN
20.0000 mL | Freq: Once | INTRAMUSCULAR | Status: AC
  Administered 2018-06-16: 20 mL via INTRADERMAL
  Filled 2018-06-16: qty 20

## 2018-06-16 NOTE — Discharge Instructions (Signed)
Keep area covered and dry for 24 hours. After 24 hours keep area clean by washing with soap and water daily and changing the dressing. Apply a bandage at least once daily, change more often if it is dirty Watch for signs of infection (redness, drainage, worsening pain) Have stitches removed in 2 weeks. There are 18 total. Go to Urgent care for a wound check in 1 week Return if you are worsening

## 2018-06-16 NOTE — ED Triage Notes (Signed)
Per PTAR pt was rushging to dr and hit her rt lower leg on brake oif walk has full thickness lac to rt lower leg ~4 inch long and 1 1/12 in wide , wrapped with gauze and bleeding htru  Bandage. Also has bruise to left upper thigh with knot

## 2018-06-16 NOTE — ED Notes (Signed)
Pt taken to xray 

## 2018-06-16 NOTE — ED Provider Notes (Signed)
[facility] Provider Note   CSN: [id number] Arrival date & time: 06/16/18  3875     History   Chief Complaint Chief Complaint  Patient presents with  . Leg Injury  . Fall    HPI Branda IKRAM RIEBE is a 72 y.o. female who presents with a fall and right leg laceration. PMH significant for DM, HTN, HLD, chronic leg edema, anemia, balance difficulties. She states that she was at home and was getting ready to come here for an iron infusion when she lost her balance and her leg ran in to her rolling walker and she fell. She fell on to her bottom. She denies hitting her head or LOC. Her grandson helped her up and noticed the bleeding. She hurts on the right thigh and left thigh. She cannot feel her leg wound due to her neuropathy. She has chronic issues with balance and has been going to PT for this.   HPI  Past Medical History:  Diagnosis Date  . Bilateral lower extremity edema 06/06/2014  . Cataracts, bilateral 02/13/2011   Seen on eye exam 02/07/11. F/u in 12 months   . Diabetes mellitus age 2  . Diabetic ulcer of toe ([place]) 12/12/2015  . Hyperlipidemia   . Hypertension   . Retinal detachment, old, partial    left  . Retinopathy due to secondary diabetes mellitus ([place])    L>R, laser 3/07  . Schizotypal personality disorder ([place])   . Thyroid disease   . Weight loss 01/22/2012    Patient Active Problem List   Diagnosis Date Noted  . Dyspnea 03/23/2018  . Impaired functional mobility, balance, gait, and endurance 03/02/2018    Class: Diagnosis of  . Abnormality of gait 02/26/2018  . Iron deficiency anemia 03/19/2017  . Vision disturbance 03/20/2015  . Memory difficulty 03/20/2015  . At high risk for falls 11/10/2012  . DIABETIC  RETINOPATHY 03/17/2007  . DIABETIC PERIPHERAL NEUROPATHY 03/17/2007  . Hypothyroidism 10/16/2006  . Uncontrolled type 2 diabetes mellitus with hyperglycemia ([place]) 10/16/2006  . OBESITY, NOS 10/16/2006  .  HYPERTENSION, BENIGN SYSTEMIC 10/16/2006  . GASTROESOPHAGEAL REFLUX, NO ESOPHAGITIS 10/16/2006  . Rheumatoid arthritis ([place]) 10/16/2006  . OSTEOPENIA 10/16/2006  . INCONTINENCE, URGE 10/16/2006    Past Surgical History:  Procedure Laterality Date  . BREAST EXCISIONAL BIOPSY    . BREAST SURGERY    . CATARACT EXTRACTION     left   . ROTATOR CUFF REPAIR  1990's   left  . Thyroid radiation ablation     for Graves Disease  . TRANSTHORACIC ECHOCARDIOGRAM      EF55-65%, nml - 12/14/2004     OB History   None      Home Medications    Prior to Admission medications   Medication Sig Start Date End Date Taking? Authorizing Provider  ACCU-CHEK SOFTCLIX LANCETS lancets USE three times daily AS DIRECTED 01/05/18   [name], MD  acetaminophen (TYLENOL) 325 MG tablet Take 650 mg by mouth every 6 (six) hours as needed.    [provider]  baclofen (LIORESAL) 10 mg/mL SUSP Take 0.25 mLs (2.5 mg total) by mouth 2 (two) times daily as needed. 02/17/18   [name], MD  Bevacizumab (AVASTIN IV) Inject into the vein.    [provider]  ferrous sulfate 325 (65 FE) MG tablet Take 1 tablet (325 mg total) by mouth every other day. 11/18/17   [name], MD  furosemide (LASIX) 80 MG  tablet Take 80 mg by mouth daily.    [provider]  glucose blood (ONETOUCH VERIO) test strip Check blood sugar daily 01/02/18   [name], MD  Lancet Devices (MICROLET NEXT LANCING DEVICE) MISC 1 each by Does not apply route 3 (three) times daily. Check blood sugar 3 times daily 12/05/16   Virginia Crews, MD  levothyroxine (SYNTHROID, LEVOTHROID) 125 MCG tablet Take 1 tablet (125 mcg total) by mouth daily. 03/25/18   [name], MD  MICROLET LANCETS MISC Use to test blood glucose three times daily. ICD-10 code: E11.65. 12/05/16   Virginia Crews, MD  PROAIR HFA 108 [phone] Base) MCG/ACT inhaler INHALE 2 PUFFS INTO THE LUNGS EVERY 6 HOURS AS  NEEDED FOR WHEEZING OR SHORTNESS OF BREATH 05/07/18   Kinnie Feil, MD  quinapril (ACCUPRIL) 10 MG tablet Take 10 mg by mouth daily.    [provider]  rosuvastatin (CRESTOR) 20 MG tablet TAKE 1 TABLET BY MOUTH EVERY DAY 04/15/18   Alveda Reasons, MD    Family History Family History  Problem Relation Age of Onset  . Heart disease Mother   . Hypertension Mother   . Heart attack Mother   . Breast cancer Other   . Colon cancer Brother   . Colon cancer Maternal Grandmother   . Pulmonary fibrosis Father   . Diabetes Daughter   . Liver disease Sister        transplant, liver  . Liver cancer Brother     Social History Social History   Tobacco Use  . Smoking status: Never Smoker  . Smokeless tobacco: Never Used  Substance Use Topics  . Alcohol use: No  . Drug use: No     Allergies   Feraheme [ferumoxytol] and Rosiglitazone maleate   Review of Systems Review of Systems  Constitutional: Negative for fever.  Musculoskeletal: Positive for arthralgias, gait problem (chronic) and myalgias. Negative for back pain.  Skin: Positive for wound.  Neurological: Positive for dizziness (chronic). Negative for headaches.  All other systems reviewed and are negative.    Physical Exam Updated Vital Signs BP (!) 158/83   Pulse 89   Temp (!) 97.4 F (36.3 C) (Oral)   Resp 14   SpO2 100%   Physical Exam  Constitutional: She is oriented to person, place, and time. She appears well-developed and well-nourished. No distress.  Calm, cooperative. Pleasant  HENT:  Head: Normocephalic and atraumatic.  Eyes: Pupils are equal, round, and reactive to light. Right eye exhibits no discharge. Left eye exhibits no discharge. No scleral icterus.  Visually impaired in the right eye  Neck: Normal range of motion.  Cardiovascular: Normal rate.  Pulmonary/Chest: Effort normal. No respiratory distress.  Abdominal: She exhibits no distension.  Musculoskeletal:  Right hip: Tenderness  over right anterior hip. Decreased ROM  Left hip: Tenderness over the left anterior hip with decreased ROM.  Decreased sensation of the bilateral lower extremities which is chronic per patient from diabetes. Significant amount of bilateral pitting edema from the feet to knees.  Neurological: She is alert and oriented to person, place, and time.  Skin: Skin is warm and dry.  Large, deep linear laceration over the anterior right shin ~8cm with muscle exposed  Psychiatric: She has a normal mood and affect. Her behavior is normal.  Nursing note and vitals reviewed.    ED Treatments / Results  Labs (all labs ordered are listed, but only abnormal results are displayed) Labs Reviewed -  No data to display  EKG None  Radiology Dg Pelvis 1-2 Views  Result Date: 06/16/2018 CLINICAL DATA:  Confusion, pain and swelling of both lower legs with some numbness and swelling in the feet EXAM: PELVIS - 1-2 VIEW COMPARISON:  None. FINDINGS: The bones are somewhat osteopenic. There is moderate degenerative joint disease involving the hips right greater than left. No acute fracture is seen. The pelvic rami are intact. The SI joints appear corticated. Haziness within the mid pelvis probably represents slight distention of the urinary bladder. Diffuse arterial calcifications are present. IMPRESSION: 1. No acute fracture. 2. Moderate degenerative joint disease of the hips, right greater than left. Electronically Signed   By: Ivar Drape M.D.   On: 06/16/2018 12:39   Dg Tibia/fibula Right  Result Date: 06/16/2018 CLINICAL DATA:  Pain and swelling of the lower legs, laceration in the right lower leg EXAM: RIGHT TIBIA AND FIBULA - 2 VIEW COMPARISON:  Right knee films of 02/06/2009 FINDINGS: There has been progression of tricompartmental degenerative joint disease of the right knee primarily involving the lateral compartment with considerable loss of joint space and sclerosis with subchondral cyst formation. No  acute fracture is seen. Alignment is normal. Diffuse arterial calcifications are present IMPRESSION: 1. No acute fracture. 2. Progression of tricompartmental degenerative joint disease of the right knee. Electronically Signed   By: Ivar Drape M.D.   On: 06/16/2018 12:40   Dg Femur Min 2 Views Left  Result Date: 06/16/2018 CLINICAL DATA:  Pain and swelling of the lower legs, laceration of the right lower leg, confusion EXAM: LEFT FEMUR 2 VIEWS COMPARISON:  None. FINDINGS: No acute fracture is seen. There is moderate degenerative joint disease of the left hip. Diffuse arterial calcifications are present. There is tricompartmental degenerative joint disease of the left knee IMPRESSION: 1. No acute fracture. 2. Degenerative change of the left hip and left knee. Electronically Signed   By: Ivar Drape M.D.   On: 06/16/2018 12:42   Dg Femur Min 2 Views Right  Result Date: 06/16/2018 CLINICAL DATA:  Pain and swelling of the lower legs, laceration of the right lower leg EXAM: RIGHT FEMUR 2 VIEWS COMPARISON:  None. FINDINGS: No acute right femoral fracture is seen. There is moderate degenerative joint disease of the right hip. Considerable arterial calcification is present diffusely. Degenerative change of the right knee is noted primarily involving the lateral compartment. IMPRESSION: 1. No acute fracture. 2. Moderate degenerative joint disease of the right hip. 3. Degenerative change of the right knee particularly involving the lateral compartment. Electronically Signed   By: Ivar Drape M.D.   On: 06/16/2018 12:41    Procedures .Marland KitchenLaceration Repair Date/Time: 06/16/2018 3:01 PM Performed by: Recardo Evangelist, PA-C Authorized by: Recardo Evangelist, PA-C   Consent:    Consent obtained:  Verbal   Consent given by:  Patient   Risks discussed:  Infection, pain, poor cosmetic result and poor wound healing Anesthesia (see MAR for exact dosages):    Anesthesia method:  Local infiltration   Local  anesthetic:  Lidocaine 2% WITH epi Laceration details:    Location:  Leg   Leg location:  R lower leg   Length (cm):  8   Depth (mm):  20 Repair type:    Repair type:  Intermediate Pre-procedure details:    Preparation:  Patient was prepped and draped in usual sterile fashion Exploration:    Wound exploration: wound explored through full range of motion and entire depth of wound  probed and visualized     Wound extent: no fascia violation noted, no muscle damage noted, no tendon damage noted, no underlying fracture noted and no vascular damage noted     Contaminated: no   Treatment:    Area cleansed with:  Shur-Clens   Amount of cleaning:  Extensive   Irrigation method:  Pressure wash   Visualized foreign bodies/material removed: no   Skin repair:    Repair method:  Sutures   Suture size:  4-0   Suture material:  Prolene   Suture technique:  Simple interrupted   Number of sutures:  18 Approximation:    Approximation:  Close Post-procedure details:    Dressing:  Non-adherent dressing and sterile dressing   Patient tolerance of procedure:  Tolerated well, no immediate complications   (including critical care time)    Medications Ordered in ED Medications  lidocaine-EPINEPHrine (XYLOCAINE W/EPI) 2 %-1:200000 (PF) injection 20 mL (has no administration in time range)     Initial Impression / Assessment and Plan / ED Course  I have reviewed the triage vital signs and the nursing notes.  Pertinent labs & imaging results that were available during my care of the patient were reviewed by me and considered in my medical decision making (see chart for details).  72 year old female presents with a mechanical fall due to her chronic gait instability and a large lower leg laceration. She is hypertensive but otherwise vitals are normal. Xray of her pelvis, left and right hip and femur and right leg are negative for underlying fracture. Laceration was repaired and extensively irrigated  in the ED. Bottom of the wound visualized and bleeding controlled. 18 sutures placed. Wound care discussed and advised to return to have stitches removed in 14 days. Also discussed with family that she should return for a wound check in one week, especially since she cannot feel the wound. Tdap is UTD. Return precautions discussed.   Final Clinical Impressions(s) / ED Diagnoses   Final diagnoses:  Fall, initial encounter  Leg laceration, right, initial encounter    ED Discharge Orders    None       Recardo Evangelist, PA-C 06/16/18 [place], MD 06/17/18 2358

## 2018-06-17 ENCOUNTER — Ambulatory Visit: Payer: Medicare Other | Admitting: Podiatry

## 2018-06-23 ENCOUNTER — Telehealth: Payer: Self-pay | Admitting: Family Medicine

## 2018-06-23 NOTE — Telephone Encounter (Signed)
Spoke with pt reminding them of/confirming their appt for tomorrow on 06/24/2018. -CH °

## 2018-06-24 ENCOUNTER — Encounter: Payer: Self-pay | Admitting: Family Medicine

## 2018-06-24 ENCOUNTER — Other Ambulatory Visit: Payer: Self-pay

## 2018-06-24 ENCOUNTER — Ambulatory Visit (INDEPENDENT_AMBULATORY_CARE_PROVIDER_SITE_OTHER): Payer: Medicare Other | Admitting: Family Medicine

## 2018-06-24 VITALS — Ht 62.0 in | Wt 141.0 lb

## 2018-06-24 DIAGNOSIS — E113593 Type 2 diabetes mellitus with proliferative diabetic retinopathy without macular edema, bilateral: Secondary | ICD-10-CM | POA: Diagnosis not present

## 2018-06-24 DIAGNOSIS — E1165 Type 2 diabetes mellitus with hyperglycemia: Secondary | ICD-10-CM | POA: Diagnosis not present

## 2018-06-24 DIAGNOSIS — S81801A Unspecified open wound, right lower leg, initial encounter: Secondary | ICD-10-CM

## 2018-06-24 DIAGNOSIS — H353222 Exudative age-related macular degeneration, left eye, with inactive choroidal neovascularization: Secondary | ICD-10-CM | POA: Diagnosis not present

## 2018-06-24 LAB — POCT GLYCOSYLATED HEMOGLOBIN (HGB A1C): HBA1C, POC (CONTROLLED DIABETIC RANGE): 8.7 % — AB (ref 0.0–7.0)

## 2018-06-24 NOTE — Assessment & Plan Note (Addendum)
Patient presents for wound check today, no sign of infection. Wound is healing well with healthy granulation tissue. Stitches are in placed with some overlaying granulation tissue noted. A few of the stitches were removed today to avoid complete cover up from granulation tissue as wound continue to heal. Few stitches left to avoid dehiscence. Wound redress in clinic and will follow up with wound care on 11/15. Continue to encourage leg elevation. Left leg bullae self draining with clear serous fluid, dressed in clinic and will follow up with wound care. No sign of infection.

## 2018-06-24 NOTE — Progress Notes (Signed)
Subjective:    Patient ID: [name], female    DOB: March 07, 1946, 72 y.o.   MRN: [id number]   CC: right leg wound follow up   HPI: Patient is a 72 yo female who presents today to follow up on right leg wound. Patient recently had a fall and subsequently suffered a laceration to her right leg requiring 18 stitches in the ED. Patient is here today because she was asked to follow with PCP. Daughter have been managing dressing changes at home and has been cleaning it with dial soap with fresh dressing every 2-3 days as instructed. They were told that stitches needed to come out in two weeks. They have an appointment scheduled with wound care on 11/15. Patient denies any pain but does have reduced sensation bilaterally. She denies any fever or chills.  Smoking status reviewed   ROS: all other systems were reviewed and are negative other than in the HPI   Past Medical History:  Diagnosis Date  . Bilateral lower extremity edema 06/06/2014  . Cataracts, bilateral 02/13/2011   Seen on eye exam 02/07/11. F/u in 12 months   . Diabetes mellitus age 59  . Diabetic ulcer of toe ([place]) 12/12/2015  . Hyperlipidemia   . Hypertension   . Retinal detachment, old, partial    left  . Retinopathy due to secondary diabetes mellitus ([place])    L>R, laser 3/07  . Schizotypal personality disorder ([place])   . Thyroid disease   . Weight loss 01/22/2012    Past Surgical History:  Procedure Laterality Date  . BREAST EXCISIONAL BIOPSY    . BREAST SURGERY    . CATARACT EXTRACTION     left   . ROTATOR CUFF REPAIR  1990's   left  . Thyroid radiation ablation     for Graves Disease  . TRANSTHORACIC ECHOCARDIOGRAM      EF55-65%, nml - 12/14/2004    Past medical history, surgical, family, and social history reviewed and updated in the EMR as appropriate.  Objective:  Ht 5\' 2"  (1.575 m)   Wt 141 lb (64 kg)   BMI 25.79 kg/m   Vitals and nursing note reviewed      General: NAD, pleasant, able to  participate in exam Cardiac: RRR, normal heart sounds, no murmurs. 2+ radial and PT pulses bilaterally Respiratory: CTAB, normal effort, No wheezes, rales or rhonchi Abdomen: soft, nontender, nondistended, no hepatic or splenomegaly, +BS Extremities: Large repaired laceration noted in right lower leg, stitched in pain, healthy surrounding tissue, laceration border well approximated with granulation tissue noted. No odor or drainage.  Left left with large bullae filled with serous fluid  Skin: warm and dry, no rashes noted Neuro: alert and oriented x4, no focal deficits Psych: Normal affect and mood   Assessment & Plan:   Wound of lower extremity, right, initial encounter Patient presents for wound check today, no sign of infection. Wound is healing well with healthy granulation tissue. Stitches are in placed with some overlaying granulation tissue noted. A few of the stitches were removed today to avoid complete cover up from granulation tissue as wound continue to heal. Few stitches left to avoid dehiscence. Wound redress in clinic and will follow up with wound care on 11/15. Continue to encourage leg elevation. Left leg bullae self draining with clear serous fluid, dressed in clinic and will follow up with wound care. No sign of infection.  Uncontrolled type 2 diabetes mellitus with hyperglycemia ([facility]) Will check A1c and discuss  results with PCP. Patient might benefit from oral agent to help improve glycemic control. Will also recheck kidney function today.    Marjie Skiff, MD [facility] PGY-3

## 2018-06-24 NOTE — Assessment & Plan Note (Signed)
Will check A1c and discuss results with PCP. Patient might benefit from oral agent to help improve glycemic control. Will also recheck kidney function today.

## 2018-06-25 LAB — BASIC METABOLIC PANEL
BUN / CREAT RATIO: 23 (ref 12–28)
BUN: 34 mg/dL — ABNORMAL HIGH (ref 8–27)
CO2: 28 mmol/L (ref 20–29)
Calcium: 9.2 mg/dL (ref 8.7–10.3)
Chloride: 98 mmol/L (ref 96–106)
Creatinine, Ser: 1.46 mg/dL — ABNORMAL HIGH (ref 0.57–1.00)
GFR calc Af Amer: 41 mL/min/{1.73_m2} — ABNORMAL LOW (ref >59)
GFR, EST NON AFRICAN AMERICAN: 36 mL/min/{1.73_m2} — AB (ref >59)
GLUCOSE: 203 mg/dL — AB (ref 65–99)
POTASSIUM: 4.7 mmol/L (ref 3.5–5.2)
SODIUM: 136 mmol/L (ref 134–144)

## 2018-06-29 ENCOUNTER — Other Ambulatory Visit: Payer: Self-pay

## 2018-06-29 ENCOUNTER — Encounter (HOSPITAL_COMMUNITY): Payer: Self-pay | Admitting: Emergency Medicine

## 2018-06-29 ENCOUNTER — Ambulatory Visit (HOSPITAL_COMMUNITY)
Admission: EM | Admit: 2018-06-29 | Discharge: 2018-06-29 | Disposition: A | Discharge status: Home / Self Care | Payer: Medicare Other | Attending: Family Medicine | Admitting: Family Medicine

## 2018-06-29 DIAGNOSIS — S81801A Unspecified open wound, right lower leg, initial encounter: Secondary | ICD-10-CM

## 2018-06-29 DIAGNOSIS — T148XXA Other injury of unspecified body region, initial encounter: Principal | ICD-10-CM

## 2018-06-29 DIAGNOSIS — S80822A Blister (nonthermal), left lower leg, initial encounter: Secondary | ICD-10-CM | POA: Diagnosis not present

## 2018-06-29 DIAGNOSIS — E1140 Type 2 diabetes mellitus with diabetic neuropathy, unspecified: Secondary | ICD-10-CM | POA: Diagnosis not present

## 2018-06-29 DIAGNOSIS — L089 Local infection of the skin and subcutaneous tissue, unspecified: Secondary | ICD-10-CM | POA: Diagnosis not present

## 2018-06-29 MED ORDER — DOXYCYCLINE HYCLATE 100 MG PO CAPS: 100.0000 mg | ORAL_CAPSULE | Freq: Two times a day (BID) | ORAL | 0 refills | Status: AC

## 2018-06-29 NOTE — Discharge Instructions (Signed)
Please begin doxycycline twice daily Please follow-up here in 2 days for wound recheck/suture removal Please increase frequency of dressing changes to at least twice daily; if dressing is getting wet, please change more frequently to avoid further skin breakdown  Please follow-up with wound clinic as planned on Friday, if able to obtain earlier appointment date please do so

## 2018-06-29 NOTE — ED Notes (Signed)
Patient denied wheel chair.

## 2018-06-29 NOTE — ED Triage Notes (Addendum)
The patient presented to the [facility] with a complaint of wounds to her lower left leg for about 1 week. The patient provided a picture of a blister that was fluid filled and had burst.  The patient also reported that she has 12 stitches in her right lower leg that are supposed to be removed tomorrow and she wants to see if they can be removed today. The patient reported an upcoming appointment at wound care on 07/03/2018.

## 2018-06-30 ENCOUNTER — Encounter (HOSPITAL_COMMUNITY): Payer: Medicare Other

## 2018-06-30 DIAGNOSIS — N183 Chronic kidney disease, stage 3 (moderate): Secondary | ICD-10-CM | POA: Diagnosis not present

## 2018-06-30 DIAGNOSIS — N2581 Secondary hyperparathyroidism of renal origin: Secondary | ICD-10-CM | POA: Diagnosis not present

## 2018-06-30 DIAGNOSIS — Z23 Encounter for immunization: Secondary | ICD-10-CM | POA: Diagnosis not present

## 2018-06-30 DIAGNOSIS — D631 Anemia in chronic kidney disease: Secondary | ICD-10-CM | POA: Diagnosis not present

## 2018-06-30 DIAGNOSIS — E1122 Type 2 diabetes mellitus with diabetic chronic kidney disease: Secondary | ICD-10-CM | POA: Diagnosis not present

## 2018-06-30 DIAGNOSIS — I129 Hypertensive chronic kidney disease with stage 1 through stage 4 chronic kidney disease, or unspecified chronic kidney disease: Secondary | ICD-10-CM | POA: Diagnosis not present

## 2018-06-30 NOTE — ED Provider Notes (Signed)
[place]    CSN: [id number] Arrival date & time: 06/29/18  1310     History   Chief Complaint Chief Complaint  Patient presents with  . Wound Check    HPI [name] is a 72 y.o. female history of DM type II, hypertension, hyperlipidemia, diabetic peripheral neuropathy presenting today for evaluation of a wound check.  Patient states that she fell approximately 2 weeks ago, had stitches placed to her right lower leg.  She was seen to have the stitches removed, but recommended to wait a little bit longer.  She also has had a blister to her left lower leg has been fluid-filled and draining.  She has been seeing wound care every 3 months and has an appointment with wound care this upcoming Friday.  She presents today to check for infection as well as see if the stitches could be removed today.  Stitches were placed approximately 13 days ago.  Was advised to return on day 63 for removal.  HPI  Past Medical History:  Diagnosis Date  . Bilateral lower extremity edema 06/06/2014  . Cataracts, bilateral 02/13/2011   Seen on eye exam 02/07/11. F/u in 12 months   . Diabetes mellitus age 34  . Diabetic ulcer of toe ([place]) 12/12/2015  . Hyperlipidemia   . Hypertension   . Retinal detachment, old, partial    left  . Retinopathy due to secondary diabetes mellitus ([place])    L>R, laser 3/07  . Schizotypal personality disorder ([place])   . Thyroid disease   . Weight loss 01/22/2012    Patient Active Problem List   Diagnosis Date Noted  . Wound of lower extremity, right, initial encounter 06/24/2018  . Dyspnea 03/23/2018  . Impaired functional mobility, balance, gait, and endurance 03/02/2018    Class: Diagnosis of  . Abnormality of gait 02/26/2018  . Iron deficiency anemia 03/19/2017  . Vision disturbance 03/20/2015  . Memory difficulty 03/20/2015  . At high risk for falls 11/10/2012  . DIABETIC  RETINOPATHY 03/17/2007  . DIABETIC PERIPHERAL NEUROPATHY 03/17/2007  .  Hypothyroidism 10/16/2006  . Uncontrolled type 2 diabetes mellitus with hyperglycemia ([place]) 10/16/2006  . OBESITY, NOS 10/16/2006  . HYPERTENSION, BENIGN SYSTEMIC 10/16/2006  . GASTROESOPHAGEAL REFLUX, NO ESOPHAGITIS 10/16/2006  . Rheumatoid arthritis ([place]) 10/16/2006  . OSTEOPENIA 10/16/2006  . INCONTINENCE, URGE 10/16/2006    Past Surgical History:  Procedure Laterality Date  . BREAST EXCISIONAL BIOPSY    . BREAST SURGERY    . CATARACT EXTRACTION     left   . ROTATOR CUFF REPAIR  1990's   left  . Thyroid radiation ablation     for Graves Disease  . TRANSTHORACIC ECHOCARDIOGRAM      EF55-65%, nml - 12/14/2004    OB History   None      Home Medications    Prior to Admission medications   Medication Sig Start Date End Date Taking? Authorizing Provider  ACCU-CHEK SOFTCLIX LANCETS lancets USE three times daily AS DIRECTED 01/05/18   [name], MD  acetaminophen (TYLENOL) 325 MG tablet Take 650 mg by mouth every 6 (six) hours as needed.    [provider]  baclofen (LIORESAL) 10 mg/mL SUSP Take 0.25 mLs (2.5 mg total) by mouth 2 (two) times daily as needed. 02/17/18   [name], MD  Bevacizumab (AVASTIN IV) Inject into the vein.    [provider]  doxycycline (VIBRAMYCIN) 100 MG capsule Take 1 capsule (100 mg total) by mouth  2 (two) times daily for 10 days. 06/29/18 07/09/18  Elisabeth Strom C, PA-C  ferrous sulfate 325 (65 FE) MG tablet Take 1 tablet (325 mg total) by mouth every other day. 11/18/17   [name], MD  furosemide (LASIX) 80 MG tablet Take 80 mg by mouth daily.    [provider]  glucose blood (ONETOUCH VERIO) test strip Check blood sugar daily 01/02/18   [name], MD  Lancet Devices (MICROLET NEXT LANCING DEVICE) MISC 1 each by Does not apply route 3 (three) times daily. Check blood sugar 3 times daily 12/05/16   Virginia Crews, MD  levothyroxine (SYNTHROID, LEVOTHROID) 125 MCG tablet Take 1  tablet (125 mcg total) by mouth daily. 03/25/18   [name], MD  MICROLET LANCETS MISC Use to test blood glucose three times daily. ICD-10 code: E11.65. 12/05/16   Virginia Crews, MD  PROAIR HFA 108 [phone] Base) MCG/ACT inhaler INHALE 2 PUFFS INTO THE LUNGS EVERY 6 HOURS AS NEEDED FOR WHEEZING OR SHORTNESS OF BREATH 05/07/18   Kinnie Feil, MD  quinapril (ACCUPRIL) 10 MG tablet Take 10 mg by mouth daily.    [provider]  rosuvastatin (CRESTOR) 20 MG tablet TAKE 1 TABLET BY MOUTH EVERY DAY 04/15/18   Alveda Reasons, MD    Family History Family History  Problem Relation Age of Onset  . Heart disease Mother   . Hypertension Mother   . Heart attack Mother   . Breast cancer Other   . Colon cancer Brother   . Colon cancer Maternal Grandmother   . Pulmonary fibrosis Father   . Diabetes Daughter   . Liver disease Sister        transplant, liver  . Liver cancer Brother     Social History Social History   Tobacco Use  . Smoking status: Never Smoker  . Smokeless tobacco: Never Used  Substance Use Topics  . Alcohol use: No  . Drug use: No     Allergies   Feraheme [ferumoxytol] and Rosiglitazone maleate   Review of Systems Review of Systems  Constitutional: Negative for fatigue and fever.  Eyes: Negative for visual disturbance.  Respiratory: Negative for shortness of breath.   Cardiovascular: Negative for chest pain.  Gastrointestinal: Negative for abdominal pain, nausea and vomiting.  Musculoskeletal: Negative for arthralgias and joint swelling.  Skin: Positive for color change and wound. Negative for rash.  Neurological: Negative for dizziness, weakness, light-headedness and headaches.     Physical Exam Triage Vital Signs ED Triage Vitals  Enc Vitals Group     BP 06/29/18 1406 (!) 122/91     Pulse Rate 06/29/18 1406 82     Resp 06/29/18 1406 18     Temp 06/29/18 1406 98.1 F (36.7 C)     Temp Source 06/29/18 1406 Oral     SpO2 06/29/18  1406 100 %     Weight --      Height --      Head Circumference --      Peak Flow --      Pain Score 06/29/18 1403 4     Pain Loc --      Pain Edu? --      Excl. in [place]? --    No data found.  Updated Vital Signs BP (!) 122/91 (BP Location: Left Arm)   Pulse 82   Temp 98.1 F (36.7 C) (Oral)   Resp 18   SpO2 100%   Visual  Acuity Right Eye Distance:   Left Eye Distance:   Bilateral Distance:    Right Eye Near:   Left Eye Near:    Bilateral Near:     Physical Exam  Constitutional: She appears well-developed and well-nourished. No distress.  HENT:  Head: Normocephalic and atraumatic.  Eyes: Conjunctivae are normal.  Neck: Neck supple.  Cardiovascular: Normal rate and regular rhythm.  No murmur heard. Pulmonary/Chest: Effort normal and breath sounds normal. No respiratory distress.  Abdominal: Soft. There is no tenderness.  Musculoskeletal: She exhibits no edema.  Neurological: She is alert.  Skin: Skin is warm and dry.  Left lower leg: See first picture below, large area of loose skin with underlying fluid, largely drained, small amount of purulent drainage from opening.  Dorsum of left foot appears slightly grey discolored from persistent with bandages lying over the foot  Right lower leg: CPAP second picture, exposed raw area with multiple sutures, small amount of purulent drainage from middle of wound, edges do not pull apart easily, but with manipulation of sutures wound still appears slightly wet  Psychiatric: She has a normal mood and affect.  Nursing note and vitals reviewed.        UC Treatments / Results  Labs (all labs ordered are listed, but only abnormal results are displayed) Labs Reviewed - No data to display  EKG None  Radiology No results found.  Procedures Procedures (including critical care time)  Medications Ordered in UC Medications - No data to display  Initial Impression / Assessment and Plan / UC Course  I have reviewed the  triage vital signs and the nursing notes.  Pertinent labs & imaging results that were available during my care of the patient were reviewed by me and considered in my medical decision making (see chart for details).    Patient has healing wound to right lower leg, concern for infection, will begin patient on doxycycline twice daily, will hold off on removing stitches have patient return in 2 days for follow-up.  Following up with wound care for wound on left lower leg, will likely needs debridement/removal of the skin.  Patient is vital signs stable, no fever or tachycardia.  Advised importance of close follow-up.  Wounds re-dressed by nursing.  Recommended more frequent wound changes given active drainage and to avoid keeping wounds wet.Discussed strict return precautions. Patient verbalized understanding and is agreeable with plan.   Final Clinical Impressions(s) / UC Diagnoses   Final diagnoses:  Wound infection     Discharge Instructions     Please begin doxycycline twice daily Please follow-up here in 2 days for wound recheck/suture removal Please increase frequency of dressing changes to at least twice daily; if dressing is getting wet, please change more frequently to avoid further skin breakdown  Please follow-up with wound clinic as planned on Friday, if able to obtain earlier appointment date please do so   ED Prescriptions    Medication Sig Dispense Auth. Provider   doxycycline (VIBRAMYCIN) 100 MG capsule Take 1 capsule (100 mg total) by mouth 2 (two) times daily for 10 days. 20 capsule Rylann [name] C, PA-C     Controlled Substance Prescriptions [place] Controlled Substance Registry consulted? Not Applicable   Janith Lima, [place] 06/30/18 [id number]

## 2018-07-01 ENCOUNTER — Ambulatory Visit: Payer: Medicare Other | Admitting: Audiology

## 2018-07-02 ENCOUNTER — Emergency Department (HOSPITAL_COMMUNITY)
Admission: EM | Admit: 2018-07-02 | Discharge: 2018-07-02 | Disposition: A | Discharge status: Home / Self Care | Payer: Medicare Other | Attending: Emergency Medicine | Admitting: Emergency Medicine

## 2018-07-02 ENCOUNTER — Encounter (HOSPITAL_COMMUNITY): Payer: Self-pay | Admitting: Student

## 2018-07-02 DIAGNOSIS — E119 Type 2 diabetes mellitus without complications: Secondary | ICD-10-CM | POA: Diagnosis not present

## 2018-07-02 DIAGNOSIS — Z5189 Encounter for other specified aftercare: Secondary | ICD-10-CM

## 2018-07-02 DIAGNOSIS — Z4802 Encounter for removal of sutures: Secondary | ICD-10-CM | POA: Diagnosis not present

## 2018-07-02 DIAGNOSIS — Z4800 Encounter for change or removal of nonsurgical wound dressing: Secondary | ICD-10-CM | POA: Diagnosis not present

## 2018-07-02 DIAGNOSIS — E039 Hypothyroidism, unspecified: Secondary | ICD-10-CM | POA: Diagnosis not present

## 2018-07-02 DIAGNOSIS — I10 Essential (primary) hypertension: Secondary | ICD-10-CM | POA: Diagnosis not present

## 2018-07-02 DIAGNOSIS — Z79899 Other long term (current) drug therapy: Secondary | ICD-10-CM | POA: Diagnosis not present

## 2018-07-02 NOTE — ED Notes (Signed)
Dressings changed prior to pt discharge. Patient verbalizes understanding of discharge instructions. Opportunity for questioning and answers were provided. Armband removed by staff, pt discharged from ED. Pt wheeled to lobby.

## 2018-07-02 NOTE — Discharge Instructions (Addendum)
You were seen in the emergency department today for suture removal.  Your stitches were removed.  We would like you to continue taking her doxycycline and changing your dressings as discussed at urgent care.  We would like you to follow-up closely tomorrow with the wound care clinic as scheduled.  Would also like you to follow-up with your podiatrist and primary care provider within the next 3 to 5 days.  Please return to the ER for new or worsening symptoms including but not limited to fever, spreading redness, pain to these areas, or any other concerns.

## 2018-07-02 NOTE — ED Provider Notes (Signed)
[facility] EMERGENCY DEPARTMENT Provider Note   CSN: [id number] Arrival date & time: 07/02/18  1616     History   Chief Complaint Chief Complaint  Patient presents with  . Wound Check    HPI [name] is a 72 y.o. female with a history of type 2 diabetes reportedly no longer on diabetic medications, diabetic neuropathy, bilateral lower extremity edema, hypertension, hyperlipidemia, schizotypal personality disorder who presents to the emergency department today for wound check.  Per chart review and discussion with the patient it appears that she had a fall 10/29 during which she was seen in the emergency department.  She had a deep laceration noted to the right lower leg which was repaired with 18 sutures. She states that since the fall she has developed a left lower leg loose skin/ ulcer as well as some loose skin to the right big toe.  She relays she has a hx of issues with loose skin previously, not to these specific areas, and that since the fall the wounds have been doing overall okay.  She was seen by her PCP 11/6 and the wound appeared to be healing with good granulation tissue, a few stitches were removed at that time.  She was seen in urgent care 2 days prior and placed on doxycycline secondary to concern for wound infection.  She was also instructed to start changing her dressings twice per day and follow-up with wound care.  She has an appointment with wound care tomorrow.  Areas are not painful. No specific alleviating or aggravating factors.  She denies fever, chills, change in baseline neuropathy, nausea, or vomiting.   HPI  Past Medical History:  Diagnosis Date  . Bilateral lower extremity edema 06/06/2014  . Cataracts, bilateral 02/13/2011   Seen on eye exam 02/07/11. F/u in 12 months   . Diabetes mellitus age 1  . Diabetic ulcer of toe ([place]) 12/12/2015  . Hyperlipidemia   . Hypertension   . Retinal detachment, old, partial    left  . Retinopathy  due to secondary diabetes mellitus ([place])    L>R, laser 3/07  . Schizotypal personality disorder ([place])   . Thyroid disease   . Weight loss 01/22/2012    Patient Active Problem List   Diagnosis Date Noted  . Wound of lower extremity, right, initial encounter 06/24/2018  . Dyspnea 03/23/2018  . Impaired functional mobility, balance, gait, and endurance 03/02/2018    Class: Diagnosis of  . Abnormality of gait 02/26/2018  . Iron deficiency anemia 03/19/2017  . Vision disturbance 03/20/2015  . Memory difficulty 03/20/2015  . At high risk for falls 11/10/2012  . DIABETIC  RETINOPATHY 03/17/2007  . DIABETIC PERIPHERAL NEUROPATHY 03/17/2007  . Hypothyroidism 10/16/2006  . Uncontrolled type 2 diabetes mellitus with hyperglycemia ([place]) 10/16/2006  . OBESITY, NOS 10/16/2006  . HYPERTENSION, BENIGN SYSTEMIC 10/16/2006  . GASTROESOPHAGEAL REFLUX, NO ESOPHAGITIS 10/16/2006  . Rheumatoid arthritis ([place]) 10/16/2006  . OSTEOPENIA 10/16/2006  . INCONTINENCE, URGE 10/16/2006    Past Surgical History:  Procedure Laterality Date  . BREAST EXCISIONAL BIOPSY    . BREAST SURGERY    . CATARACT EXTRACTION     left   . ROTATOR CUFF REPAIR  1990's   left  . Thyroid radiation ablation     for Graves Disease  . TRANSTHORACIC ECHOCARDIOGRAM      EF55-65%, nml - 12/14/2004     OB History   None      Home Medications  Prior to Admission medications   Medication Sig Start Date End Date Taking? Authorizing Provider  ACCU-CHEK SOFTCLIX LANCETS lancets USE three times daily AS DIRECTED 01/05/18   [name], MD  acetaminophen (TYLENOL) 325 MG tablet Take 650 mg by mouth every 6 (six) hours as needed.    [provider]  baclofen (LIORESAL) 10 mg/mL SUSP Take 0.25 mLs (2.5 mg total) by mouth 2 (two) times daily as needed. 02/17/18   [name], MD  Bevacizumab (AVASTIN IV) Inject into the vein.    [provider]  doxycycline (VIBRAMYCIN) 100 MG capsule Take 1  capsule (100 mg total) by mouth 2 (two) times daily for 10 days. 06/29/18 07/09/18  Wieters, Hallie C, PA-C  ferrous sulfate 325 (65 FE) MG tablet Take 1 tablet (325 mg total) by mouth every other day. 11/18/17   [name], MD  furosemide (LASIX) 80 MG tablet Take 80 mg by mouth daily.    [provider]  glucose blood (ONETOUCH VERIO) test strip Check blood sugar daily 01/02/18   [name], MD  Lancet Devices (MICROLET NEXT LANCING DEVICE) MISC 1 each by Does not apply route 3 (three) times daily. Check blood sugar 3 times daily 12/05/16   Virginia Crews, MD  levothyroxine (SYNTHROID, LEVOTHROID) 125 MCG tablet Take 1 tablet (125 mcg total) by mouth daily. 03/25/18   [name], MD  MICROLET LANCETS MISC Use to test blood glucose three times daily. ICD-10 code: E11.65. 12/05/16   Virginia Crews, MD  PROAIR HFA 108 [phone] Base) MCG/ACT inhaler INHALE 2 PUFFS INTO THE LUNGS EVERY 6 HOURS AS NEEDED FOR WHEEZING OR SHORTNESS OF BREATH 05/07/18   Kinnie Feil, MD  quinapril (ACCUPRIL) 10 MG tablet Take 10 mg by mouth daily.    [provider]  rosuvastatin (CRESTOR) 20 MG tablet TAKE 1 TABLET BY MOUTH EVERY DAY 04/15/18   Alveda Reasons, MD    Family History Family History  Problem Relation Age of Onset  . Heart disease Mother   . Hypertension Mother   . Heart attack Mother   . Breast cancer Other   . Colon cancer Brother   . Colon cancer Maternal Grandmother   . Pulmonary fibrosis Father   . Diabetes Daughter   . Liver disease Sister        transplant, liver  . Liver cancer Brother     Social History Social History   Tobacco Use  . Smoking status: Never Smoker  . Smokeless tobacco: Never Used  Substance Use Topics  . Alcohol use: No  . Drug use: No     Allergies   Feraheme [ferumoxytol] and Rosiglitazone maleate   Review of Systems Review of Systems  Constitutional: Negative for chills and fever.  Cardiovascular:  Positive for leg swelling (chronic, bilateral).  Gastrointestinal: Negative for nausea and vomiting.  Musculoskeletal: Negative for myalgias.  Skin: Positive for wound.  Neurological: Negative for weakness.       Denies change in baseline neuropathy.   All other systems reviewed and are negative.  Physical Exam Updated Vital Signs BP (!) 157/78   Pulse 91   Temp 98.8 F (37.1 C) (Oral)   Resp 16   Ht 5\' 2"  (1.575 m)   SpO2 100%   BMI 25.79 kg/m   Physical Exam  Constitutional: She appears well-developed and well-nourished. No distress.  HENT:  Head: Normocephalic and atraumatic.  Eyes: Conjunctivae are normal. Right eye exhibits no  discharge. Left eye exhibits no discharge.  Cardiovascular: Normal rate and regular rhythm.  Pulses:      Dorsalis pedis pulses are 2+ on the right side, and 2+ on the left side.       Posterior tibial pulses are 2+ on the right side, and 2+ on the left side.  Pulmonary/Chest: Effort normal. No respiratory distress.  Musculoskeletal:  Lower extremities: Patient has symmetric pitting edema to the lower legs that does not extend past the knee.  Right lower leg has a healing laceration with granulation tissue and what appears to be 14 sutures in place. Does not appear to have significant surrounding erythema or purulent drainage.  Left lower extremity has large area of loose skin. R great toe with loose skin as well. interwebspaces with maceration bilaterally. Lower extremities are nontender. NVI distally.   All wound areas pictured below.   Neurological: She is alert.  Clear speech.  Sensation grossly intact bilateral lower extremities.  5 out of 5 strength to plantar and dorsiflexion bilaterally.  Patient is ambulatory.   Psychiatric: She has a normal mood and affect. Her behavior is normal. Thought content normal.  Nursing note and vitals reviewed.            ED Treatments / Results  Labs (all labs ordered are listed, but only abnormal  results are displayed) Labs Reviewed - No data to display  EKG None  Radiology No results found.  Procedures .Suture Removal Date/Time: 07/02/2018 5:50 PM Performed by: Amaryllis Dyke, PA-C Authorized by: Amaryllis Dyke, PA-C   Consent:    Consent obtained:  Verbal   Consent given by:  Patient (& daughter)   Risks discussed:  Bleeding, pain and wound separation   Alternatives discussed:  No treatment Location:    Location:  Lower extremity   Lower extremity location:  Leg   Leg location:  R lower leg Procedure details:    Objective wound description: wound does not appear significant infected. currently on doxycycline.   Number of sutures removed:  14 Post-procedure details:    Post-removal:  Dressing applied   Patient tolerance of procedure:  Tolerated well, no immediate complications   (including critical care time)  Medications Ordered in ED Medications - No data to display   Initial Impression / Assessment and Plan / ED Course  I have reviewed the triage vital signs and the nursing notes.  Pertinent labs & imaging results that were available during my care of the patient were reviewed by me and considered in my medical decision making (see chart for details).   Patient presents to the emergency department for suture removal of right lower extremity wound.  It appears that sutures were placed 10/29.  She was seen at urgent care 2 days prior and there was some concern for infection to the wound, she was placed on doxycycline for this.  She states she has been doing generally well.  She states that she has some loose skin to the left lower leg as well as the right great toe from fall in addition to the laceration repaired with sutures. Her daughter has been changing dressings to bilateral lower extremities daily, urgent care recommended she start doing this twice daily - patient has been compliant with these instructions and has been taking doxycycline as  prescribed. Exam as above. Findings and plan of care discussed with supervising physician Dr.Floyd who personally evaluated and examined this patient- recommends suture removal in the ER, redressing of wounds, and  follow up with wound care tomorrow as scheduled given the areas do not appear overly infected today and she is currently on abx, I am in agreement. Patient afebrile, repeat vitals all WNL other than mildly elevated BP- doubt HTN emergency. Suture removal per procedure note above. I discussed treatment plan, need for follow-up, and return precautions with the patient as well as her daughter and nephew at bedside. Provided opportunity for questions, patient confirmed understanding and is in agreement with plan.   . Final Clinical Impressions(s) / ED Diagnoses   Final diagnoses:  Visit for wound check  Visit for suture removal    ED Discharge Orders    None       Amaryllis Dyke, PA-C 07/02/18 [place], Dan, DO 07/02/18 2029

## 2018-07-02 NOTE — ED Triage Notes (Signed)
Pt reports a fall on 10/29 which she had stitches placed to the right lower extremity and also had blistering on her legs from the fall. Pt reports blister to right big toe on the third and blister to left leg on november 4th.

## 2018-07-03 ENCOUNTER — Encounter (HOSPITAL_BASED_OUTPATIENT_CLINIC_OR_DEPARTMENT_OTHER): Payer: Medicare Other | Attending: Internal Medicine

## 2018-07-03 DIAGNOSIS — E1142 Type 2 diabetes mellitus with diabetic polyneuropathy: Secondary | ICD-10-CM | POA: Diagnosis not present

## 2018-07-03 DIAGNOSIS — E11621 Type 2 diabetes mellitus with foot ulcer: Secondary | ICD-10-CM | POA: Diagnosis not present

## 2018-07-03 DIAGNOSIS — I10 Essential (primary) hypertension: Secondary | ICD-10-CM | POA: Diagnosis not present

## 2018-07-03 DIAGNOSIS — L97511 Non-pressure chronic ulcer of other part of right foot limited to breakdown of skin: Secondary | ICD-10-CM | POA: Diagnosis not present

## 2018-07-03 DIAGNOSIS — L97529 Non-pressure chronic ulcer of other part of left foot with unspecified severity: Secondary | ICD-10-CM | POA: Diagnosis not present

## 2018-07-03 DIAGNOSIS — M069 Rheumatoid arthritis, unspecified: Secondary | ICD-10-CM | POA: Diagnosis not present

## 2018-07-03 DIAGNOSIS — S91101A Unspecified open wound of right great toe without damage to nail, initial encounter: Secondary | ICD-10-CM | POA: Diagnosis not present

## 2018-07-03 DIAGNOSIS — E11622 Type 2 diabetes mellitus with other skin ulcer: Secondary | ICD-10-CM | POA: Diagnosis not present

## 2018-07-03 DIAGNOSIS — S81802A Unspecified open wound, left lower leg, initial encounter: Secondary | ICD-10-CM | POA: Diagnosis not present

## 2018-07-03 DIAGNOSIS — L97821 Non-pressure chronic ulcer of other part of left lower leg limited to breakdown of skin: Secondary | ICD-10-CM | POA: Insufficient documentation

## 2018-07-03 DIAGNOSIS — S81811A Laceration without foreign body, right lower leg, initial encounter: Secondary | ICD-10-CM | POA: Diagnosis not present

## 2018-07-07 ENCOUNTER — Encounter (HOSPITAL_COMMUNITY)
Admission: RE | Admit: 2018-07-07 | Discharge: 2018-07-07 | Disposition: A | Discharge status: Home / Self Care | Payer: Medicare Other | Source: Ambulatory Visit | Attending: Nephrology | Admitting: Nephrology

## 2018-07-07 VITALS — BP 127/63 | HR 80 | Temp 97.8°F | Resp 20

## 2018-07-07 DIAGNOSIS — Z79899 Other long term (current) drug therapy: Secondary | ICD-10-CM | POA: Insufficient documentation

## 2018-07-07 DIAGNOSIS — D631 Anemia in chronic kidney disease: Secondary | ICD-10-CM | POA: Diagnosis not present

## 2018-07-07 DIAGNOSIS — N183 Chronic kidney disease, stage 3 (moderate): Secondary | ICD-10-CM | POA: Diagnosis not present

## 2018-07-07 DIAGNOSIS — D508 Other iron deficiency anemias: Secondary | ICD-10-CM

## 2018-07-07 DIAGNOSIS — Z5181 Encounter for therapeutic drug level monitoring: Secondary | ICD-10-CM | POA: Insufficient documentation

## 2018-07-07 LAB — IRON AND TIBC
Iron: 39 ug/dL (ref 28–170)
SATURATION RATIOS: 21 % (ref 10.4–31.8)
TIBC: 182 ug/dL — AB (ref 250–450)
UIBC: 143 ug/dL

## 2018-07-07 LAB — FERRITIN: Ferritin: 562 ng/mL — ABNORMAL HIGH (ref 11–307)

## 2018-07-07 MED ORDER — EPOETIN ALFA-EPBX 40000 UNIT/ML IJ SOLN: 30000.0000 [IU] | INTRAMUSCULAR | Status: DC

## 2018-07-07 MED ORDER — EPOETIN ALFA-EPBX 10000 UNIT/ML IJ SOLN
30000.0000 [IU] | Freq: Once | INTRAMUSCULAR | Status: AC
  Administered 2018-07-07: 30000 [IU] via SUBCUTANEOUS
  Filled 2018-07-07: qty 3

## 2018-07-07 NOTE — Progress Notes (Addendum)
Hgb today via hemocue is 7.9. Asymptomatic. Spoke with Stacy in Dr Deterdings office who spoke with Dr Jimmy Footman and instructed [place] to give current reticrit as ordered but he will have her coming every 2 weeks instead of every 3 weeks. Next appointment set for Dec 3rd.

## 2018-07-08 LAB — POCT HEMOGLOBIN-HEMACUE: Hemoglobin: 7.9 g/dL — ABNORMAL LOW (ref 12.0–15.0)

## 2018-07-10 ENCOUNTER — Ambulatory Visit (INDEPENDENT_AMBULATORY_CARE_PROVIDER_SITE_OTHER): Payer: Medicare Other | Admitting: Family Medicine

## 2018-07-10 ENCOUNTER — Encounter: Payer: Self-pay | Admitting: Family Medicine

## 2018-07-10 ENCOUNTER — Other Ambulatory Visit: Payer: Self-pay

## 2018-07-10 DIAGNOSIS — L97821 Non-pressure chronic ulcer of other part of left lower leg limited to breakdown of skin: Secondary | ICD-10-CM | POA: Diagnosis not present

## 2018-07-10 DIAGNOSIS — E1165 Type 2 diabetes mellitus with hyperglycemia: Secondary | ICD-10-CM | POA: Diagnosis not present

## 2018-07-10 DIAGNOSIS — E1142 Type 2 diabetes mellitus with diabetic polyneuropathy: Secondary | ICD-10-CM | POA: Diagnosis not present

## 2018-07-10 DIAGNOSIS — S81811A Laceration without foreign body, right lower leg, initial encounter: Secondary | ICD-10-CM | POA: Diagnosis not present

## 2018-07-10 DIAGNOSIS — E11622 Type 2 diabetes mellitus with other skin ulcer: Secondary | ICD-10-CM | POA: Diagnosis not present

## 2018-07-10 DIAGNOSIS — M069 Rheumatoid arthritis, unspecified: Secondary | ICD-10-CM | POA: Diagnosis not present

## 2018-07-10 DIAGNOSIS — S91101A Unspecified open wound of right great toe without damage to nail, initial encounter: Secondary | ICD-10-CM | POA: Diagnosis not present

## 2018-07-10 DIAGNOSIS — R0600 Dyspnea, unspecified: Secondary | ICD-10-CM | POA: Diagnosis not present

## 2018-07-10 DIAGNOSIS — L97511 Non-pressure chronic ulcer of other part of right foot limited to breakdown of skin: Secondary | ICD-10-CM | POA: Diagnosis not present

## 2018-07-10 DIAGNOSIS — S81802A Unspecified open wound, left lower leg, initial encounter: Secondary | ICD-10-CM | POA: Diagnosis not present

## 2018-07-10 DIAGNOSIS — S91104A Unspecified open wound of right lesser toe(s) without damage to nail, initial encounter: Secondary | ICD-10-CM | POA: Diagnosis not present

## 2018-07-10 DIAGNOSIS — E11621 Type 2 diabetes mellitus with foot ulcer: Secondary | ICD-10-CM | POA: Diagnosis not present

## 2018-07-10 DIAGNOSIS — I10 Essential (primary) hypertension: Secondary | ICD-10-CM | POA: Diagnosis not present

## 2018-07-10 DIAGNOSIS — L97529 Non-pressure chronic ulcer of other part of left foot with unspecified severity: Secondary | ICD-10-CM | POA: Diagnosis not present

## 2018-07-10 MED ORDER — GLIPIZIDE 5 MG PO TABS: 2.5000 mg | ORAL_TABLET | Freq: Every day | ORAL | 1 refills | Status: DC

## 2018-07-10 NOTE — Assessment & Plan Note (Signed)
Echo reviewed, shows G1DD, otherwise unremarkable Symptoms are improved. Follow if worsening.

## 2018-07-10 NOTE — Patient Instructions (Signed)
Get colonoscopy  Start glipizide 2.5 mg daily Check sugars if you feel like sugar is dropping If lower than 80 eat something sugary and call us  Follow up in 3 months  Be well, Dr. [name]

## 2018-07-10 NOTE — Assessment & Plan Note (Signed)
Now uncontrolled with A1c of 8.7. Start glipizide 2.5mg  daily. Counseled on dietary measures to control diabetes. Patient has meter at home & knows to check sugars if symptoms of low sugars.

## 2018-07-10 NOTE — Progress Notes (Signed)
Date of Visit: 07/10/2018   HPI:  Patient presents for routine follow up. Accompanied by her daughter.  Diabetes - recent A1c check was 8.7, up from 7.3 in early July. Not presently on any diabetes medications. Not checking sugars at home but has meter and is able to check them.  Seeing wound center this PM for care of her leg/feet wounds  ROS: See HPI.  [place]: history of type 2 diabetes, hypothyroidism, hypertension, rheumatoid arthritis  PHYSICAL EXAM: BP 130/74   Pulse 60   Temp 98.5 F (36.9 C) (Oral)   Ht 5\' 2"  (1.575 m)   Wt 141 lb 9.6 oz (64.2 kg)   SpO2 99%   BMI 25.90 kg/m  Gen: no acute distress, pleasant, cooperative HEENT: normocephalic, atraumatic, moist mucous membranes  Heart: regular rate and rhythm, no murmur Lungs: clear to auscultation bilaterally, normal work of breathing   ASSESSMENT/PLAN:  Health maintenance:  -got flu shot at nephro office -advised again they call to schedule colonoscopy  Uncontrolled type 2 diabetes mellitus with hyperglycemia ([place]) Now uncontrolled with A1c of 8.7. Start glipizide 2.5mg  daily. Counseled on dietary measures to control diabetes. Patient has meter at home & knows to check sugars if symptoms of low sugars.  Dyspnea Echo reviewed, shows G1DD, otherwise unremarkable Symptoms are improved. Follow if worsening.  FOLLOW UP: Follow up in 3 mos for A1c  [place] J. [name], [place]

## 2018-07-14 DIAGNOSIS — L97821 Non-pressure chronic ulcer of other part of left lower leg limited to breakdown of skin: Secondary | ICD-10-CM | POA: Diagnosis not present

## 2018-07-14 DIAGNOSIS — E11621 Type 2 diabetes mellitus with foot ulcer: Secondary | ICD-10-CM | POA: Diagnosis not present

## 2018-07-14 DIAGNOSIS — E039 Hypothyroidism, unspecified: Secondary | ICD-10-CM | POA: Diagnosis not present

## 2018-07-14 DIAGNOSIS — L97521 Non-pressure chronic ulcer of other part of left foot limited to breakdown of skin: Secondary | ICD-10-CM | POA: Diagnosis not present

## 2018-07-14 DIAGNOSIS — L97511 Non-pressure chronic ulcer of other part of right foot limited to breakdown of skin: Secondary | ICD-10-CM | POA: Diagnosis not present

## 2018-07-14 DIAGNOSIS — I129 Hypertensive chronic kidney disease with stage 1 through stage 4 chronic kidney disease, or unspecified chronic kidney disease: Secondary | ICD-10-CM | POA: Diagnosis not present

## 2018-07-14 DIAGNOSIS — S81811D Laceration without foreign body, right lower leg, subsequent encounter: Secondary | ICD-10-CM | POA: Diagnosis not present

## 2018-07-14 DIAGNOSIS — E1142 Type 2 diabetes mellitus with diabetic polyneuropathy: Secondary | ICD-10-CM | POA: Diagnosis not present

## 2018-07-14 DIAGNOSIS — N183 Chronic kidney disease, stage 3 (moderate): Secondary | ICD-10-CM | POA: Diagnosis not present

## 2018-07-14 DIAGNOSIS — I10 Essential (primary) hypertension: Secondary | ICD-10-CM | POA: Diagnosis not present

## 2018-07-18 DIAGNOSIS — I10 Essential (primary) hypertension: Secondary | ICD-10-CM | POA: Diagnosis not present

## 2018-07-18 DIAGNOSIS — L97821 Non-pressure chronic ulcer of other part of left lower leg limited to breakdown of skin: Secondary | ICD-10-CM | POA: Diagnosis not present

## 2018-07-18 DIAGNOSIS — E1142 Type 2 diabetes mellitus with diabetic polyneuropathy: Secondary | ICD-10-CM | POA: Diagnosis not present

## 2018-07-18 DIAGNOSIS — E039 Hypothyroidism, unspecified: Secondary | ICD-10-CM | POA: Diagnosis not present

## 2018-07-18 DIAGNOSIS — L97511 Non-pressure chronic ulcer of other part of right foot limited to breakdown of skin: Secondary | ICD-10-CM | POA: Diagnosis not present

## 2018-07-18 DIAGNOSIS — L97521 Non-pressure chronic ulcer of other part of left foot limited to breakdown of skin: Secondary | ICD-10-CM | POA: Diagnosis not present

## 2018-07-18 DIAGNOSIS — E11621 Type 2 diabetes mellitus with foot ulcer: Secondary | ICD-10-CM | POA: Diagnosis not present

## 2018-07-18 DIAGNOSIS — S81811D Laceration without foreign body, right lower leg, subsequent encounter: Secondary | ICD-10-CM | POA: Diagnosis not present

## 2018-07-20 ENCOUNTER — Encounter (HOSPITAL_BASED_OUTPATIENT_CLINIC_OR_DEPARTMENT_OTHER): Payer: Medicare Other | Attending: Internal Medicine

## 2018-07-20 ENCOUNTER — Other Ambulatory Visit (HOSPITAL_COMMUNITY)
Admission: RE | Admit: 2018-07-20 | Discharge: 2018-07-20 | Disposition: A | Discharge status: Home / Self Care | Payer: Medicare Other | Source: Other Acute Inpatient Hospital | Attending: Internal Medicine | Admitting: Internal Medicine

## 2018-07-20 ENCOUNTER — Encounter (HOSPITAL_BASED_OUTPATIENT_CLINIC_OR_DEPARTMENT_OTHER): Payer: Self-pay

## 2018-07-20 ENCOUNTER — Other Ambulatory Visit (HOSPITAL_COMMUNITY): Payer: Self-pay

## 2018-07-20 DIAGNOSIS — E11622 Type 2 diabetes mellitus with other skin ulcer: Secondary | ICD-10-CM | POA: Insufficient documentation

## 2018-07-20 DIAGNOSIS — I10 Essential (primary) hypertension: Secondary | ICD-10-CM | POA: Diagnosis not present

## 2018-07-20 DIAGNOSIS — L97821 Non-pressure chronic ulcer of other part of left lower leg limited to breakdown of skin: Secondary | ICD-10-CM | POA: Insufficient documentation

## 2018-07-20 DIAGNOSIS — S81812A Laceration without foreign body, left lower leg, initial encounter: Secondary | ICD-10-CM | POA: Diagnosis not present

## 2018-07-20 DIAGNOSIS — B9689 Other specified bacterial agents as the cause of diseases classified elsewhere: Secondary | ICD-10-CM | POA: Insufficient documentation

## 2018-07-20 DIAGNOSIS — M069 Rheumatoid arthritis, unspecified: Secondary | ICD-10-CM | POA: Diagnosis not present

## 2018-07-20 DIAGNOSIS — E11610 Type 2 diabetes mellitus with diabetic neuropathic arthropathy: Secondary | ICD-10-CM | POA: Insufficient documentation

## 2018-07-20 DIAGNOSIS — I96 Gangrene, not elsewhere classified: Secondary | ICD-10-CM | POA: Diagnosis not present

## 2018-07-20 DIAGNOSIS — L97812 Non-pressure chronic ulcer of other part of right lower leg with fat layer exposed: Secondary | ICD-10-CM | POA: Insufficient documentation

## 2018-07-20 DIAGNOSIS — L97519 Non-pressure chronic ulcer of other part of right foot with unspecified severity: Secondary | ICD-10-CM | POA: Insufficient documentation

## 2018-07-20 DIAGNOSIS — L97529 Non-pressure chronic ulcer of other part of left foot with unspecified severity: Secondary | ICD-10-CM | POA: Diagnosis not present

## 2018-07-20 DIAGNOSIS — L97512 Non-pressure chronic ulcer of other part of right foot with fat layer exposed: Secondary | ICD-10-CM | POA: Insufficient documentation

## 2018-07-20 DIAGNOSIS — E11621 Type 2 diabetes mellitus with foot ulcer: Secondary | ICD-10-CM | POA: Diagnosis not present

## 2018-07-20 DIAGNOSIS — S81811A Laceration without foreign body, right lower leg, initial encounter: Secondary | ICD-10-CM | POA: Diagnosis not present

## 2018-07-20 DIAGNOSIS — L02612 Cutaneous abscess of left foot: Secondary | ICD-10-CM | POA: Diagnosis not present

## 2018-07-20 DIAGNOSIS — E1152 Type 2 diabetes mellitus with diabetic peripheral angiopathy with gangrene: Secondary | ICD-10-CM | POA: Insufficient documentation

## 2018-07-20 DIAGNOSIS — I998 Other disorder of circulatory system: Secondary | ICD-10-CM | POA: Diagnosis not present

## 2018-07-20 DIAGNOSIS — S91302A Unspecified open wound, left foot, initial encounter: Secondary | ICD-10-CM | POA: Diagnosis not present

## 2018-07-20 DIAGNOSIS — L97522 Non-pressure chronic ulcer of other part of left foot with fat layer exposed: Secondary | ICD-10-CM | POA: Diagnosis not present

## 2018-07-21 ENCOUNTER — Ambulatory Visit (HOSPITAL_COMMUNITY)
Admission: RE | Admit: 2018-07-21 | Discharge: 2018-07-21 | Disposition: A | Discharge status: Home / Self Care | Payer: Medicare Other | Source: Ambulatory Visit | Attending: Nephrology | Admitting: Nephrology

## 2018-07-21 VITALS — BP 137/69 | HR 75 | Temp 98.2°F | Resp 20

## 2018-07-21 DIAGNOSIS — D508 Other iron deficiency anemias: Secondary | ICD-10-CM

## 2018-07-21 DIAGNOSIS — D631 Anemia in chronic kidney disease: Secondary | ICD-10-CM | POA: Insufficient documentation

## 2018-07-21 DIAGNOSIS — N183 Chronic kidney disease, stage 3 (moderate): Secondary | ICD-10-CM | POA: Diagnosis not present

## 2018-07-21 LAB — POCT HEMOGLOBIN-HEMACUE: HEMOGLOBIN: 8.8 g/dL — AB (ref 12.0–15.0)

## 2018-07-21 MED ORDER — EPOETIN ALFA-EPBX 40000 UNIT/ML IJ SOLN: 30000.0000 [IU] | INTRAMUSCULAR | Status: DC

## 2018-07-21 MED ORDER — EPOETIN ALFA-EPBX 10000 UNIT/ML IJ SOLN
30000.0000 [IU] | Freq: Once | INTRAMUSCULAR | Status: AC
  Administered 2018-07-21: 30000 [IU] via SUBCUTANEOUS
  Filled 2018-07-21: qty 3

## 2018-07-22 ENCOUNTER — Other Ambulatory Visit: Payer: Self-pay | Admitting: *Deleted

## 2018-07-22 ENCOUNTER — Telehealth: Payer: Self-pay | Admitting: *Deleted

## 2018-07-22 ENCOUNTER — Encounter: Payer: Self-pay | Admitting: *Deleted

## 2018-07-22 ENCOUNTER — Ambulatory Visit (INDEPENDENT_AMBULATORY_CARE_PROVIDER_SITE_OTHER): Payer: Medicare Other | Admitting: Physician Assistant

## 2018-07-22 VITALS — BP 139/75 | HR 82 | Resp 16 | Ht 62.0 in | Wt 133.0 lb

## 2018-07-22 DIAGNOSIS — I739 Peripheral vascular disease, unspecified: Secondary | ICD-10-CM | POA: Insufficient documentation

## 2018-07-22 DIAGNOSIS — S81801A Unspecified open wound, right lower leg, initial encounter: Secondary | ICD-10-CM | POA: Diagnosis not present

## 2018-07-22 NOTE — Progress Notes (Signed)
Established Critical Limb Ischemia Patient   History of Present Illness   [name] is a 71 y.o. ([birth year]) female who presents with chief complaint: tissue changes R foot.  She has been followed over the past couple years for arterial and venous disease.  She is followed by the [facility] long wound clinic and has been in [employer] over the past several weeks due to venous ulcerations on bilateral medial lower legs.  Patient also states that she developed blistering on the first 3 toes of her right foot about a month ago that have since ruptured.  Her right great toe is now with dry gangrene.  She has also had trauma to her right lateral lower leg that required sutures.  Sutures have since been removed and wound clinic continues to pack a portion of the wound that has not yet healed.  She denies claudication or rest pain.  In August of this year she was found to have incompressible tibial vessels with a right TBI of 0.54 and a left TBI of 0.6.  She is an insulin-dependent diabetic with most recent hemoglobin A1c of 10.  She denies tobacco use.  She states she takes a baby aspirin and a statin daily.  Past medical history also significant for CKD with creatinine of 1.46 and GFR of 41 as of 06/24/2018.  The patient's PMH, PSH, SH, and FamHx were reviewed and are unchanged from prior visit.  Current Outpatient Medications  Medication Sig Dispense Refill  . ACCU-CHEK SOFTCLIX LANCETS lancets USE three times daily AS DIRECTED 100 each 11  . acetaminophen (TYLENOL) 325 MG tablet Take 650 mg by mouth every 6 (six) hours as needed.    . Bevacizumab (AVASTIN IV) Inject into the vein.    . ferrous sulfate 325 (65 FE) MG tablet Take 1 tablet (325 mg total) by mouth every other day. 45 tablet 3  . furosemide (LASIX) 80 MG tablet Take 160 mg by mouth 3 (three) times daily.     Marland Kitchen glipiZIDE (GLUCOTROL) 5 MG tablet Take 0.5 tablets (2.5 mg total) by mouth daily after breakfast. 45 tablet 1  . glucose blood  (ONETOUCH VERIO) test strip Check blood sugar daily 100 each 3  . Lancet Devices (MICROLET NEXT LANCING DEVICE) MISC 1 each by Does not apply route 3 (three) times daily. Check blood sugar 3 times daily 1 each 0  . levothyroxine (SYNTHROID, LEVOTHROID) 125 MCG tablet Take 1 tablet (125 mcg total) by mouth daily. 90 tablet 3  . MICROLET LANCETS MISC Use to test blood glucose three times daily. ICD-10 code: E11.65. 100 each 12  . PROAIR HFA 108 (90 Base) MCG/ACT inhaler INHALE 2 PUFFS INTO THE LUNGS EVERY 6 HOURS AS NEEDED FOR WHEEZING OR SHORTNESS OF BREATH 8.5 Inhaler 0  . quinapril (ACCUPRIL) 10 MG tablet Take 10 mg by mouth daily.    . rosuvastatin (CRESTOR) 20 MG tablet TAKE 1 TABLET BY MOUTH EVERY DAY 90 tablet 3   No current facility-administered medications for this visit.     On ROS today: 10 system ROS is negative unless otherwise noted in HPI   Physical Examination   Vitals:   07/22/18 1316  BP: 139/75  Pulse: 82  Resp: 16  SpO2: 96%  Weight: 133 lb (60.3 kg)  Height: 5\' 2"  (1.575 m)   Body mass index is 24.33 kg/m.  General Alert, O x 3, WD  Pulmonary Sym exp, good B air movt, CTA B  Cardiac RRR,  Nl S1, S2  Vascular Vessel Right Left  Radial Palpable Palpable  Aorta Not palpable N/A  Femoral Palpable Palpable  Popliteal Not palpable Not palpable  PT Not palpable Not palpable  DP Faintly palpable Faintly palpable    Gastro- intestinal soft, non-distended, non-tender to palpation,   Musculo- skeletal M/S 5/5 throughout  Generalized bilateral lower extremity edema, medial aspect of right lateral traumatic wound no purulence noted with manipulation; right great toe dry gangrene; tips of second and third toe dusky  Neurologic CA&O     Medical Decision Making   [name] is a 72 y.o. female who presents with venous and arterial disease now with gangrenous R GT   Unna boot reapplied to left lower extremity  We will discontinue Unna boot application on  right lower extremity now that patient has gangrenous changes to toes of the right foot  Traumatic wound of right lateral leg repacked  Plan will be for right lower extremity arteriogram with possible intervention likely with CO2 contrast due to underlying CKD  I discussed critical limb ischemia with the patient and she is aware of the risks and potential benefits of angiography  She agrees to proceed; this will be scheduled within the next week   Dagoberto Ligas PA-C [facility] [place] Office: [phone]

## 2018-07-22 NOTE — Telephone Encounter (Signed)
Home health nurse aware of aortogram on 07-23-18

## 2018-07-23 ENCOUNTER — Other Ambulatory Visit: Payer: Self-pay

## 2018-07-23 ENCOUNTER — Encounter (HOSPITAL_COMMUNITY): Payer: Self-pay | Admitting: Vascular Surgery

## 2018-07-23 ENCOUNTER — Encounter (HOSPITAL_COMMUNITY)
Admission: RE | Disposition: A | Discharge status: Home / Self Care | Payer: Self-pay | Source: Home / Self Care | Attending: Vascular Surgery

## 2018-07-23 ENCOUNTER — Ambulatory Visit (HOSPITAL_COMMUNITY)
Admission: RE | Admit: 2018-07-23 | Discharge: 2018-07-23 | Disposition: A | Discharge status: Home / Self Care | Payer: Medicare Other | Attending: Vascular Surgery | Admitting: Vascular Surgery

## 2018-07-23 DIAGNOSIS — E1122 Type 2 diabetes mellitus with diabetic chronic kidney disease: Secondary | ICD-10-CM | POA: Diagnosis not present

## 2018-07-23 DIAGNOSIS — I70261 Atherosclerosis of native arteries of extremities with gangrene, right leg: Secondary | ICD-10-CM | POA: Diagnosis not present

## 2018-07-23 DIAGNOSIS — Z79890 Hormone replacement therapy (postmenopausal): Secondary | ICD-10-CM | POA: Insufficient documentation

## 2018-07-23 DIAGNOSIS — E11621 Type 2 diabetes mellitus with foot ulcer: Secondary | ICD-10-CM | POA: Insufficient documentation

## 2018-07-23 DIAGNOSIS — Z794 Long term (current) use of insulin: Secondary | ICD-10-CM | POA: Insufficient documentation

## 2018-07-23 DIAGNOSIS — E1152 Type 2 diabetes mellitus with diabetic peripheral angiopathy with gangrene: Secondary | ICD-10-CM | POA: Diagnosis not present

## 2018-07-23 DIAGNOSIS — Z79899 Other long term (current) drug therapy: Secondary | ICD-10-CM | POA: Diagnosis not present

## 2018-07-23 DIAGNOSIS — L97514 Non-pressure chronic ulcer of other part of right foot with necrosis of bone: Secondary | ICD-10-CM | POA: Diagnosis not present

## 2018-07-23 DIAGNOSIS — N189 Chronic kidney disease, unspecified: Secondary | ICD-10-CM | POA: Diagnosis not present

## 2018-07-23 HISTORY — PX: ABDOMINAL AORTOGRAM W/LOWER EXTREMITY: CATH118223

## 2018-07-23 LAB — POCT I-STAT, CHEM 8
BUN: 37 mg/dL — ABNORMAL HIGH (ref 8–23)
Calcium, Ion: 1.21 mmol/L (ref 1.15–1.40)
Chloride: 104 mmol/L (ref 98–111)
Creatinine, Ser: 1.7 mg/dL — ABNORMAL HIGH (ref 0.44–1.00)
Glucose, Bld: 110 mg/dL — ABNORMAL HIGH (ref 70–99)
HEMATOCRIT: 33 % — AB (ref 36.0–46.0)
Hemoglobin: 11.2 g/dL — ABNORMAL LOW (ref 12.0–15.0)
POTASSIUM: 4.5 mmol/L (ref 3.5–5.1)
Sodium: 140 mmol/L (ref 135–145)
TCO2: 30 mmol/L (ref 22–32)

## 2018-07-23 LAB — GLUCOSE, CAPILLARY
Glucose-Capillary: 102 mg/dL — ABNORMAL HIGH (ref 70–99)
Glucose-Capillary: 92 mg/dL (ref 70–99)

## 2018-07-23 SURGERY — ABDOMINAL AORTOGRAM W/LOWER EXTREMITY
Anesthesia: LOCAL

## 2018-07-23 MED ORDER — LIDOCAINE HCL (PF) 1 % IJ SOLN
INTRAMUSCULAR | Status: AC
  Filled 2018-07-23: qty 30

## 2018-07-23 MED ORDER — LABETALOL HCL 5 MG/ML IV SOLN: 10.0000 mg | INTRAVENOUS | Status: DC | PRN

## 2018-07-23 MED ORDER — HYDRALAZINE HCL 20 MG/ML IJ SOLN: 5.0000 mg | INTRAMUSCULAR | Status: DC | PRN

## 2018-07-23 MED ORDER — ACETAMINOPHEN 325 MG PO TABS: 650.0000 mg | ORAL_TABLET | ORAL | Status: DC | PRN

## 2018-07-23 MED ORDER — ONDANSETRON HCL 4 MG/2ML IJ SOLN: 4.0000 mg | Freq: Four times a day (QID) | INTRAMUSCULAR | Status: DC | PRN

## 2018-07-23 MED ORDER — LIDOCAINE HCL (PF) 1 % IJ SOLN
INTRAMUSCULAR | Status: DC | PRN
  Administered 2018-07-23: 15 mL

## 2018-07-23 MED ORDER — SODIUM CHLORIDE 0.9% FLUSH: 3.0000 mL | INTRAVENOUS | Status: DC | PRN

## 2018-07-23 MED ORDER — HEPARIN (PORCINE) IN NACL 1000-0.9 UT/500ML-% IV SOLN
INTRAVENOUS | Status: AC
  Filled 2018-07-23: qty 1000

## 2018-07-23 MED ORDER — SODIUM CHLORIDE 0.9% FLUSH: 3.0000 mL | Freq: Two times a day (BID) | INTRAVENOUS | Status: DC

## 2018-07-23 MED ORDER — HEPARIN (PORCINE) IN NACL 1000-0.9 UT/500ML-% IV SOLN
INTRAVENOUS | Status: DC | PRN
  Administered 2018-07-23 (×2): 500 mL

## 2018-07-23 MED ORDER — SODIUM CHLORIDE 0.9 % IV SOLN: 250.0000 mL | INTRAVENOUS | Status: DC | PRN

## 2018-07-23 MED ORDER — SODIUM CHLORIDE 0.9 % IV SOLN
INTRAVENOUS | Status: DC
  Administered 2018-07-23: 08:00:00 via INTRAVENOUS

## 2018-07-23 MED ORDER — IODIXANOL 320 MG/ML IV SOLN
INTRAVENOUS | Status: DC | PRN
  Administered 2018-07-23: 20 mL via INTRA_ARTERIAL

## 2018-07-23 SURGICAL SUPPLY — 14 items
CATH CROSS OVER TEMPO 5F (CATHETERS) ×1 IMPLANT
CATH OMNI FLUSH 5F 65CM (CATHETERS) ×1 IMPLANT
DEVICE CLOSURE MYNXGRIP 5F (Vascular Products) ×1 IMPLANT
FILTER CO2 0.2 MICRON (VASCULAR PRODUCTS) ×1 IMPLANT
KIT MICROPUNCTURE NIT STIFF (SHEATH) ×1 IMPLANT
KIT PV (KITS) ×2 IMPLANT
RESERVOIR CO2 (VASCULAR PRODUCTS) ×1 IMPLANT
SET FLUSH CO2 (MISCELLANEOUS) ×1 IMPLANT
SHEATH PINNACLE 5F 10CM (SHEATH) ×1 IMPLANT
SHEATH PROBE COVER 6X72 (BAG) ×1 IMPLANT
SYR MEDRAD MARK 7 150ML (SYRINGE) ×2 IMPLANT
TRANSDUCER W/STOPCOCK (MISCELLANEOUS) ×2 IMPLANT
TRAY PV CATH (CUSTOM PROCEDURE TRAY) ×2 IMPLANT
WIRE BENTSON .035X145CM (WIRE) ×1 IMPLANT

## 2018-07-23 NOTE — H&P (Signed)
History and Physical Interval Note:  07/23/2018 8:11 AM  [name]  has presented today for surgery, with the diagnosis of pvd  The various methods of treatment have been discussed with the patient and family. After consideration of risks, benefits and other options for treatment, the patient has consented to  Procedure(s): ABDOMINAL AORTOGRAM W/LOWER EXTREMITY (N/A) as a surgical intervention .  The patient's history has been reviewed, patient examined, no change in status, stable for surgery.  I have reviewed the patient's chart and labs.  Questions were answered to the patient's satisfaction.     Right leg arteriogram for mixed disease.  Marty Heck  Established Critical Limb Ischemia Patient   History of Present Illness   [name] is a 72 y.o. (1945-09-09) female who presents with chief complaint: tissue changes R foot.  She has been followed over the past couple years for arterial and venous disease.  She is followed by the [facility] long wound clinic and has been in [employer] over the past several weeks due to venous ulcerations on bilateral medial lower legs.  Patient also states that she developed blistering on the first 3 toes of her right foot about a month ago that have since ruptured.  Her right great toe is now with dry gangrene.  She has also had trauma to her right lateral lower leg that required sutures.  Sutures have since been removed and wound clinic continues to pack a portion of the wound that has not yet healed.  She denies claudication or rest pain.  In August of this year she was found to have incompressible tibial vessels with a right TBI of 0.54 and a left TBI of 0.6.  She is an insulin-dependent diabetic with most recent hemoglobin A1c of 10.  She denies tobacco use.  She states she takes a baby aspirin and a statin daily.  Past medical history also significant for CKD with creatinine of 1.46 and GFR of 41 as of 06/24/2018.  The patient's PMH, PSH, SH,  and FamHx were reviewed and are unchanged from prior visit.        Current Outpatient Medications  Medication Sig Dispense Refill  . ACCU-CHEK SOFTCLIX LANCETS lancets USE three times daily AS DIRECTED 100 each 11  . acetaminophen (TYLENOL) 325 MG tablet Take 650 mg by mouth every 6 (six) hours as needed.    . Bevacizumab (AVASTIN IV) Inject into the vein.    . ferrous sulfate 325 (65 FE) MG tablet Take 1 tablet (325 mg total) by mouth every other day. 45 tablet 3  . furosemide (LASIX) 80 MG tablet Take 160 mg by mouth 3 (three) times daily.     Marland Kitchen glipiZIDE (GLUCOTROL) 5 MG tablet Take 0.5 tablets (2.5 mg total) by mouth daily after breakfast. 45 tablet 1  . glucose blood (ONETOUCH VERIO) test strip Check blood sugar daily 100 each 3  . Lancet Devices (MICROLET NEXT LANCING DEVICE) MISC 1 each by Does not apply route 3 (three) times daily. Check blood sugar 3 times daily 1 each 0  . levothyroxine (SYNTHROID, LEVOTHROID) 125 MCG tablet Take 1 tablet (125 mcg total) by mouth daily. 90 tablet 3  . MICROLET LANCETS MISC Use to test blood glucose three times daily. ICD-10 code: E11.65. 100 each 12  . PROAIR HFA 108 (90 Base) MCG/ACT inhaler INHALE 2 PUFFS INTO THE LUNGS EVERY 6 HOURS AS NEEDED FOR WHEEZING OR SHORTNESS OF BREATH 8.5 Inhaler 0  . quinapril (ACCUPRIL)  10 MG tablet Take 10 mg by mouth daily.    . rosuvastatin (CRESTOR) 20 MG tablet TAKE 1 TABLET BY MOUTH EVERY DAY 90 tablet 3   No current facility-administered medications for this visit.     On ROS today: 10 system ROS is negative unless otherwise noted in HPI   Physical Examination      Vitals:   07/22/18 1316  BP: 139/75  Pulse: 82  Resp: 16  SpO2: 96%  Weight: 133 lb (60.3 kg)  Height: 5\' 2"  (1.575 m)   Body mass index is 24.33 kg/m.  General Alert, O x 3, WD  Pulmonary Sym exp, good B air movt, CTA B  Cardiac RRR, Nl S1, S2  Vascular Vessel Right Left  Radial Palpable Palpable  Aorta Not  palpable N/A  Femoral Palpable Palpable  Popliteal Not palpable Not palpable  PT Not palpable Not palpable  DP Faintly palpable Faintly palpable    Gastro- intestinal soft, non-distended, non-tender to palpation,   Musculo- skeletal M/S 5/5 throughout  Generalized bilateral lower extremity edema, medial aspect of right lateral traumatic wound no purulence noted with manipulation; right great toe dry gangrene; tips of second and third toe dusky  Neurologic CA&O     Medical Decision Making   [name] is a 72 y.o. female who presents with venous and arterial disease now with gangrenous R GT   Unna boot reapplied to left lower extremity  We will discontinue Unna boot application on right lower extremity now that patient has gangrenous changes to toes of the right foot  Traumatic wound of right lateral leg repacked  Plan will be for right lower extremity arteriogram with possible intervention likely with CO2 contrast due to underlying CKD  I discussed critical limb ischemia with the patient and she is aware of the risks and potential benefits of angiography  She agrees to proceed; this will be scheduled within the next week   Dagoberto Ligas PA-C [facility] [place] Office: [phone]

## 2018-07-23 NOTE — Op Note (Signed)
    Patient name: KATALAYA BEEL MRN: [id number] DOB: Mar 23, 1946 Sex: female  07/23/2018 Pre-operative Diagnosis: Concern for mixed arterial and venous disease of the right lower extremity with necrotic right toes Post-operative diagnosis:  Same Surgeon:  Marty Heck, MD Procedure Performed: 1.  Ultrasound-guided access of the left common femoral artery 2.  CO2 aortogram 3.  Right lower extremity arteriogram with selection of third order branches using CO2 and contrast to evaluate the tibials 4.  Mynx closure of the lower left common femoral artery  Indications: Patient is a 72 year old female who presented to clinic yesterday with necrotic changes to her right first second and third toes in the setting of severe venous insufficiency.  She presents today for right lower extremity arteriogram to rule out any underlying arterial disease in the setting of her venous disease is being treated with an Haematologist.  Risks and benefits were discussed with the patient.  Findings: CO2 aortogram showed no significant aortoiliac disease.  After selection of the right external iliac artery CO2 aortogram of the right lower extremity showed no significant common femoral, profunda, or SFA disease.  We did use contrast to evaluate the tibials and runoff and she had patent tibial trifurcation with patent anterior tibial, peroneal, and posterior tibial arteries with filling of the dorsalis pedis in both plantar arch vessels.  No intervention was performed.  She will have some component of small vessel disease distally near her toes given her diabetes but this is not amenable to intervention.   Procedure:  The patient was identified in the holding area and taken to room 8.  The patient was then placed supine on the table and prepped and draped in the usual sterile fashion.  A time out was called.  Ultrasound was used to evaluate the left common femoral artery.  It was patent .  A digital ultrasound image was  acquired.  A micropuncture needle was used to access the left common femoral artery under ultrasound guidance.  An 018 wire was advanced without resistance and a micropuncture sheath was placed.  The 018 wire was removed and a benson wire was placed.  The micropuncture sheath was exchanged for a 5 french sheath.  An omniflush catheter was advanced over the wire to the level of L-1.  An abdominal angiogram was obtained with CO2.  Next, using the omniflush catheter and a benson wire, the aortic bifurcation was crossed and the catheter was placed into theright external iliac artery and right runoff was obtained.   We initially used CO2 given her renal function.  Ultimately I selected the SFA distally as far as our Omni Flush catheter were track and then performed contrast injections of the tibial vessels.  Ultimately no significant vascular disease was noted with patent aortoiliac segment, patent common femoral SFA and profunda, and patent popliteal and three-vessel runoff with all the three tibials patent.  Her dorsalis pedis and her plantar arch vessels also filled.  At that point in time wires and catheters were removed.  Mynx closure device was deployed in the left groin.  Pressure was held for another 5 minutes.     Marty Heck, MD [facility] [place] Office: [phone] Pager: [place]

## 2018-07-23 NOTE — Discharge Instructions (Signed)

## 2018-07-24 ENCOUNTER — Telehealth: Payer: Self-pay | Admitting: Vascular Surgery

## 2018-07-24 ENCOUNTER — Encounter (HOSPITAL_BASED_OUTPATIENT_CLINIC_OR_DEPARTMENT_OTHER): Payer: Medicare Other

## 2018-07-24 DIAGNOSIS — L97522 Non-pressure chronic ulcer of other part of left foot with fat layer exposed: Secondary | ICD-10-CM | POA: Diagnosis not present

## 2018-07-24 DIAGNOSIS — N183 Chronic kidney disease, stage 3 (moderate): Secondary | ICD-10-CM | POA: Diagnosis not present

## 2018-07-24 DIAGNOSIS — M069 Rheumatoid arthritis, unspecified: Secondary | ICD-10-CM | POA: Diagnosis not present

## 2018-07-24 DIAGNOSIS — L97512 Non-pressure chronic ulcer of other part of right foot with fat layer exposed: Secondary | ICD-10-CM | POA: Diagnosis not present

## 2018-07-24 DIAGNOSIS — B9689 Other specified bacterial agents as the cause of diseases classified elsewhere: Secondary | ICD-10-CM | POA: Diagnosis not present

## 2018-07-24 DIAGNOSIS — L02612 Cutaneous abscess of left foot: Secondary | ICD-10-CM | POA: Diagnosis not present

## 2018-07-24 DIAGNOSIS — I96 Gangrene, not elsewhere classified: Secondary | ICD-10-CM | POA: Diagnosis not present

## 2018-07-24 DIAGNOSIS — S81811A Laceration without foreign body, right lower leg, initial encounter: Secondary | ICD-10-CM | POA: Diagnosis not present

## 2018-07-24 DIAGNOSIS — E11621 Type 2 diabetes mellitus with foot ulcer: Secondary | ICD-10-CM | POA: Diagnosis not present

## 2018-07-24 DIAGNOSIS — L97812 Non-pressure chronic ulcer of other part of right lower leg with fat layer exposed: Secondary | ICD-10-CM | POA: Diagnosis not present

## 2018-07-24 DIAGNOSIS — S91104A Unspecified open wound of right lesser toe(s) without damage to nail, initial encounter: Secondary | ICD-10-CM | POA: Diagnosis not present

## 2018-07-24 DIAGNOSIS — L03116 Cellulitis of left lower limb: Secondary | ICD-10-CM | POA: Diagnosis not present

## 2018-07-24 DIAGNOSIS — L97519 Non-pressure chronic ulcer of other part of right foot with unspecified severity: Secondary | ICD-10-CM | POA: Diagnosis not present

## 2018-07-24 DIAGNOSIS — I10 Essential (primary) hypertension: Secondary | ICD-10-CM | POA: Diagnosis not present

## 2018-07-24 DIAGNOSIS — L97529 Non-pressure chronic ulcer of other part of left foot with unspecified severity: Secondary | ICD-10-CM | POA: Diagnosis not present

## 2018-07-24 DIAGNOSIS — E11622 Type 2 diabetes mellitus with other skin ulcer: Secondary | ICD-10-CM | POA: Diagnosis not present

## 2018-07-24 DIAGNOSIS — E1152 Type 2 diabetes mellitus with diabetic peripheral angiopathy with gangrene: Secondary | ICD-10-CM | POA: Diagnosis not present

## 2018-07-24 DIAGNOSIS — E11610 Type 2 diabetes mellitus with diabetic neuropathic arthropathy: Secondary | ICD-10-CM | POA: Diagnosis not present

## 2018-07-24 DIAGNOSIS — I129 Hypertensive chronic kidney disease with stage 1 through stage 4 chronic kidney disease, or unspecified chronic kidney disease: Secondary | ICD-10-CM | POA: Diagnosis not present

## 2018-07-24 NOTE — Telephone Encounter (Signed)
sch appt [facility] 1215 pm wound check NP

## 2018-07-24 NOTE — Telephone Encounter (Signed)
-----   Message from Marty Heck, MD sent at 07/23/2018  9:35 AM EST ----- Patient name: [name]  MRN: [id number]        DOB: 07/13/1946          Sex: female  07/23/2018 Pre-operative Diagnosis: Concern for mixed arterial and venous disease of the right lower extremity with necrotic right toes Post-operative diagnosis:  Same Surgeon:  Marty Heck, MD Procedure Performed: 1.  Ultrasound-guided access of the left common femoral artery 2.  CO2 aortogram 3.  Right lower extremity arteriogram with selection of third order branches using CO2 and contrast to evaluate the tibials 4.  Mynx closure of the lower left common femoral artery   #F/U in PA or NP clinic in one month for wound check of right toes.  Thanks,  Gerald Stabs

## 2018-07-25 LAB — AEROBIC CULTURE W GRAM STAIN (SUPERFICIAL SPECIMEN)

## 2018-07-25 LAB — AEROBIC CULTURE  (SUPERFICIAL SPECIMEN)

## 2018-07-27 DIAGNOSIS — L97511 Non-pressure chronic ulcer of other part of right foot limited to breakdown of skin: Secondary | ICD-10-CM | POA: Diagnosis not present

## 2018-07-27 DIAGNOSIS — E039 Hypothyroidism, unspecified: Secondary | ICD-10-CM | POA: Diagnosis not present

## 2018-07-27 DIAGNOSIS — E11621 Type 2 diabetes mellitus with foot ulcer: Secondary | ICD-10-CM | POA: Diagnosis not present

## 2018-07-27 DIAGNOSIS — L97821 Non-pressure chronic ulcer of other part of left lower leg limited to breakdown of skin: Secondary | ICD-10-CM | POA: Diagnosis not present

## 2018-07-27 DIAGNOSIS — I10 Essential (primary) hypertension: Secondary | ICD-10-CM | POA: Diagnosis not present

## 2018-07-27 DIAGNOSIS — S81811D Laceration without foreign body, right lower leg, subsequent encounter: Secondary | ICD-10-CM | POA: Diagnosis not present

## 2018-07-27 DIAGNOSIS — E1142 Type 2 diabetes mellitus with diabetic polyneuropathy: Secondary | ICD-10-CM | POA: Diagnosis not present

## 2018-07-27 DIAGNOSIS — L97521 Non-pressure chronic ulcer of other part of left foot limited to breakdown of skin: Secondary | ICD-10-CM | POA: Diagnosis not present

## 2018-07-28 ENCOUNTER — Encounter (HOSPITAL_COMMUNITY): Payer: Medicare Other

## 2018-07-29 DIAGNOSIS — B9689 Other specified bacterial agents as the cause of diseases classified elsewhere: Secondary | ICD-10-CM | POA: Diagnosis not present

## 2018-07-29 DIAGNOSIS — L97529 Non-pressure chronic ulcer of other part of left foot with unspecified severity: Secondary | ICD-10-CM | POA: Diagnosis not present

## 2018-07-29 DIAGNOSIS — E11621 Type 2 diabetes mellitus with foot ulcer: Secondary | ICD-10-CM | POA: Diagnosis not present

## 2018-07-29 DIAGNOSIS — I96 Gangrene, not elsewhere classified: Secondary | ICD-10-CM | POA: Diagnosis not present

## 2018-07-29 DIAGNOSIS — I10 Essential (primary) hypertension: Secondary | ICD-10-CM | POA: Diagnosis not present

## 2018-07-29 DIAGNOSIS — S81811D Laceration without foreign body, right lower leg, subsequent encounter: Secondary | ICD-10-CM | POA: Diagnosis not present

## 2018-07-29 DIAGNOSIS — L02612 Cutaneous abscess of left foot: Secondary | ICD-10-CM | POA: Diagnosis not present

## 2018-07-29 DIAGNOSIS — L97812 Non-pressure chronic ulcer of other part of right lower leg with fat layer exposed: Secondary | ICD-10-CM | POA: Diagnosis not present

## 2018-07-29 DIAGNOSIS — L97522 Non-pressure chronic ulcer of other part of left foot with fat layer exposed: Secondary | ICD-10-CM | POA: Diagnosis not present

## 2018-07-29 DIAGNOSIS — L97519 Non-pressure chronic ulcer of other part of right foot with unspecified severity: Secondary | ICD-10-CM | POA: Diagnosis not present

## 2018-07-29 DIAGNOSIS — L97821 Non-pressure chronic ulcer of other part of left lower leg limited to breakdown of skin: Secondary | ICD-10-CM | POA: Diagnosis not present

## 2018-07-29 DIAGNOSIS — E1152 Type 2 diabetes mellitus with diabetic peripheral angiopathy with gangrene: Secondary | ICD-10-CM | POA: Diagnosis not present

## 2018-07-29 DIAGNOSIS — E11610 Type 2 diabetes mellitus with diabetic neuropathic arthropathy: Secondary | ICD-10-CM | POA: Diagnosis not present

## 2018-07-29 DIAGNOSIS — L97512 Non-pressure chronic ulcer of other part of right foot with fat layer exposed: Secondary | ICD-10-CM | POA: Diagnosis not present

## 2018-07-29 DIAGNOSIS — L97521 Non-pressure chronic ulcer of other part of left foot limited to breakdown of skin: Secondary | ICD-10-CM | POA: Diagnosis not present

## 2018-07-29 DIAGNOSIS — M069 Rheumatoid arthritis, unspecified: Secondary | ICD-10-CM | POA: Diagnosis not present

## 2018-07-29 DIAGNOSIS — E11622 Type 2 diabetes mellitus with other skin ulcer: Secondary | ICD-10-CM | POA: Diagnosis not present

## 2018-07-29 DIAGNOSIS — E039 Hypothyroidism, unspecified: Secondary | ICD-10-CM | POA: Diagnosis not present

## 2018-07-29 DIAGNOSIS — E1142 Type 2 diabetes mellitus with diabetic polyneuropathy: Secondary | ICD-10-CM | POA: Diagnosis not present

## 2018-07-29 DIAGNOSIS — L97511 Non-pressure chronic ulcer of other part of right foot limited to breakdown of skin: Secondary | ICD-10-CM | POA: Diagnosis not present

## 2018-07-30 DIAGNOSIS — E113593 Type 2 diabetes mellitus with proliferative diabetic retinopathy without macular edema, bilateral: Secondary | ICD-10-CM | POA: Diagnosis not present

## 2018-07-30 DIAGNOSIS — H35052 Retinal neovascularization, unspecified, left eye: Secondary | ICD-10-CM | POA: Diagnosis not present

## 2018-07-31 DIAGNOSIS — S91104A Unspecified open wound of right lesser toe(s) without damage to nail, initial encounter: Secondary | ICD-10-CM | POA: Diagnosis not present

## 2018-07-31 DIAGNOSIS — E11621 Type 2 diabetes mellitus with foot ulcer: Secondary | ICD-10-CM | POA: Diagnosis not present

## 2018-07-31 DIAGNOSIS — L02612 Cutaneous abscess of left foot: Secondary | ICD-10-CM | POA: Diagnosis not present

## 2018-07-31 DIAGNOSIS — L97519 Non-pressure chronic ulcer of other part of right foot with unspecified severity: Secondary | ICD-10-CM | POA: Diagnosis not present

## 2018-07-31 DIAGNOSIS — S91302A Unspecified open wound, left foot, initial encounter: Secondary | ICD-10-CM | POA: Diagnosis not present

## 2018-07-31 DIAGNOSIS — I96 Gangrene, not elsewhere classified: Secondary | ICD-10-CM | POA: Diagnosis not present

## 2018-07-31 DIAGNOSIS — L97522 Non-pressure chronic ulcer of other part of left foot with fat layer exposed: Secondary | ICD-10-CM | POA: Diagnosis not present

## 2018-07-31 DIAGNOSIS — E1152 Type 2 diabetes mellitus with diabetic peripheral angiopathy with gangrene: Secondary | ICD-10-CM | POA: Diagnosis not present

## 2018-07-31 DIAGNOSIS — E11622 Type 2 diabetes mellitus with other skin ulcer: Secondary | ICD-10-CM | POA: Diagnosis not present

## 2018-07-31 DIAGNOSIS — E11610 Type 2 diabetes mellitus with diabetic neuropathic arthropathy: Secondary | ICD-10-CM | POA: Diagnosis not present

## 2018-07-31 DIAGNOSIS — S81801A Unspecified open wound, right lower leg, initial encounter: Secondary | ICD-10-CM | POA: Diagnosis not present

## 2018-07-31 DIAGNOSIS — L97529 Non-pressure chronic ulcer of other part of left foot with unspecified severity: Secondary | ICD-10-CM | POA: Diagnosis not present

## 2018-07-31 DIAGNOSIS — M069 Rheumatoid arthritis, unspecified: Secondary | ICD-10-CM | POA: Diagnosis not present

## 2018-07-31 DIAGNOSIS — B9689 Other specified bacterial agents as the cause of diseases classified elsewhere: Secondary | ICD-10-CM | POA: Diagnosis not present

## 2018-07-31 DIAGNOSIS — L97512 Non-pressure chronic ulcer of other part of right foot with fat layer exposed: Secondary | ICD-10-CM | POA: Diagnosis not present

## 2018-07-31 DIAGNOSIS — L97812 Non-pressure chronic ulcer of other part of right lower leg with fat layer exposed: Secondary | ICD-10-CM | POA: Diagnosis not present

## 2018-07-31 DIAGNOSIS — I10 Essential (primary) hypertension: Secondary | ICD-10-CM | POA: Diagnosis not present

## 2018-08-03 DIAGNOSIS — E039 Hypothyroidism, unspecified: Secondary | ICD-10-CM | POA: Diagnosis not present

## 2018-08-03 DIAGNOSIS — L97511 Non-pressure chronic ulcer of other part of right foot limited to breakdown of skin: Secondary | ICD-10-CM | POA: Diagnosis not present

## 2018-08-03 DIAGNOSIS — L97821 Non-pressure chronic ulcer of other part of left lower leg limited to breakdown of skin: Secondary | ICD-10-CM | POA: Diagnosis not present

## 2018-08-03 DIAGNOSIS — S81811D Laceration without foreign body, right lower leg, subsequent encounter: Secondary | ICD-10-CM | POA: Diagnosis not present

## 2018-08-03 DIAGNOSIS — E11621 Type 2 diabetes mellitus with foot ulcer: Secondary | ICD-10-CM | POA: Diagnosis not present

## 2018-08-03 DIAGNOSIS — I10 Essential (primary) hypertension: Secondary | ICD-10-CM | POA: Diagnosis not present

## 2018-08-03 DIAGNOSIS — E1142 Type 2 diabetes mellitus with diabetic polyneuropathy: Secondary | ICD-10-CM | POA: Diagnosis not present

## 2018-08-03 DIAGNOSIS — L97521 Non-pressure chronic ulcer of other part of left foot limited to breakdown of skin: Secondary | ICD-10-CM | POA: Diagnosis not present

## 2018-08-04 ENCOUNTER — Ambulatory Visit (HOSPITAL_COMMUNITY)
Admission: RE | Admit: 2018-08-04 | Discharge: 2018-08-04 | Disposition: A | Discharge status: Home / Self Care | Payer: Medicare Other | Source: Ambulatory Visit | Attending: Nephrology | Admitting: Nephrology

## 2018-08-04 VITALS — BP 114/55 | HR 84 | Temp 98.3°F | Resp 20

## 2018-08-04 DIAGNOSIS — D508 Other iron deficiency anemias: Secondary | ICD-10-CM

## 2018-08-04 DIAGNOSIS — D631 Anemia in chronic kidney disease: Secondary | ICD-10-CM | POA: Insufficient documentation

## 2018-08-04 DIAGNOSIS — N183 Chronic kidney disease, stage 3 (moderate): Secondary | ICD-10-CM | POA: Diagnosis not present

## 2018-08-04 DIAGNOSIS — Z79899 Other long term (current) drug therapy: Secondary | ICD-10-CM | POA: Insufficient documentation

## 2018-08-04 DIAGNOSIS — Z5181 Encounter for therapeutic drug level monitoring: Secondary | ICD-10-CM | POA: Diagnosis not present

## 2018-08-04 LAB — IRON AND TIBC
Iron: 22 ug/dL — ABNORMAL LOW (ref 28–170)
Saturation Ratios: 13 % (ref 10.4–31.8)
TIBC: 167 ug/dL — ABNORMAL LOW (ref 250–450)
UIBC: 145 ug/dL

## 2018-08-04 LAB — FERRITIN: FERRITIN: 390 ng/mL — AB (ref 11–307)

## 2018-08-04 LAB — POCT HEMOGLOBIN-HEMACUE: HEMOGLOBIN: 8.7 g/dL — AB (ref 12.0–15.0)

## 2018-08-04 MED ORDER — EPOETIN ALFA-EPBX 10000 UNIT/ML IJ SOLN
30000.0000 [IU] | INTRAMUSCULAR | Status: DC
  Administered 2018-08-04: 30000 [IU] via SUBCUTANEOUS
  Filled 2018-08-04: qty 3

## 2018-08-05 DIAGNOSIS — E039 Hypothyroidism, unspecified: Secondary | ICD-10-CM | POA: Diagnosis not present

## 2018-08-05 DIAGNOSIS — E1142 Type 2 diabetes mellitus with diabetic polyneuropathy: Secondary | ICD-10-CM | POA: Diagnosis not present

## 2018-08-05 DIAGNOSIS — L97511 Non-pressure chronic ulcer of other part of right foot limited to breakdown of skin: Secondary | ICD-10-CM | POA: Diagnosis not present

## 2018-08-05 DIAGNOSIS — E11621 Type 2 diabetes mellitus with foot ulcer: Secondary | ICD-10-CM | POA: Diagnosis not present

## 2018-08-05 DIAGNOSIS — L97521 Non-pressure chronic ulcer of other part of left foot limited to breakdown of skin: Secondary | ICD-10-CM | POA: Diagnosis not present

## 2018-08-05 DIAGNOSIS — I10 Essential (primary) hypertension: Secondary | ICD-10-CM | POA: Diagnosis not present

## 2018-08-05 DIAGNOSIS — L97821 Non-pressure chronic ulcer of other part of left lower leg limited to breakdown of skin: Secondary | ICD-10-CM | POA: Diagnosis not present

## 2018-08-05 DIAGNOSIS — S81811D Laceration without foreign body, right lower leg, subsequent encounter: Secondary | ICD-10-CM | POA: Diagnosis not present

## 2018-08-07 DIAGNOSIS — I10 Essential (primary) hypertension: Secondary | ICD-10-CM | POA: Diagnosis not present

## 2018-08-07 DIAGNOSIS — L97519 Non-pressure chronic ulcer of other part of right foot with unspecified severity: Secondary | ICD-10-CM | POA: Diagnosis not present

## 2018-08-07 DIAGNOSIS — N183 Chronic kidney disease, stage 3 (moderate): Secondary | ICD-10-CM | POA: Diagnosis not present

## 2018-08-07 DIAGNOSIS — B9689 Other specified bacterial agents as the cause of diseases classified elsewhere: Secondary | ICD-10-CM | POA: Diagnosis not present

## 2018-08-07 DIAGNOSIS — I96 Gangrene, not elsewhere classified: Secondary | ICD-10-CM | POA: Diagnosis not present

## 2018-08-07 DIAGNOSIS — E1152 Type 2 diabetes mellitus with diabetic peripheral angiopathy with gangrene: Secondary | ICD-10-CM | POA: Diagnosis not present

## 2018-08-07 DIAGNOSIS — S91101A Unspecified open wound of right great toe without damage to nail, initial encounter: Secondary | ICD-10-CM | POA: Diagnosis not present

## 2018-08-07 DIAGNOSIS — S91104A Unspecified open wound of right lesser toe(s) without damage to nail, initial encounter: Secondary | ICD-10-CM | POA: Diagnosis not present

## 2018-08-07 DIAGNOSIS — E1122 Type 2 diabetes mellitus with diabetic chronic kidney disease: Secondary | ICD-10-CM | POA: Diagnosis not present

## 2018-08-07 DIAGNOSIS — L97812 Non-pressure chronic ulcer of other part of right lower leg with fat layer exposed: Secondary | ICD-10-CM | POA: Diagnosis not present

## 2018-08-07 DIAGNOSIS — L97512 Non-pressure chronic ulcer of other part of right foot with fat layer exposed: Secondary | ICD-10-CM | POA: Diagnosis not present

## 2018-08-07 DIAGNOSIS — L02612 Cutaneous abscess of left foot: Secondary | ICD-10-CM | POA: Diagnosis not present

## 2018-08-07 DIAGNOSIS — E11622 Type 2 diabetes mellitus with other skin ulcer: Secondary | ICD-10-CM | POA: Diagnosis not present

## 2018-08-07 DIAGNOSIS — S81811A Laceration without foreign body, right lower leg, initial encounter: Secondary | ICD-10-CM | POA: Diagnosis not present

## 2018-08-07 DIAGNOSIS — M069 Rheumatoid arthritis, unspecified: Secondary | ICD-10-CM | POA: Diagnosis not present

## 2018-08-07 DIAGNOSIS — L97529 Non-pressure chronic ulcer of other part of left foot with unspecified severity: Secondary | ICD-10-CM | POA: Diagnosis not present

## 2018-08-07 DIAGNOSIS — R809 Proteinuria, unspecified: Secondary | ICD-10-CM | POA: Diagnosis not present

## 2018-08-07 DIAGNOSIS — I129 Hypertensive chronic kidney disease with stage 1 through stage 4 chronic kidney disease, or unspecified chronic kidney disease: Secondary | ICD-10-CM | POA: Diagnosis not present

## 2018-08-07 DIAGNOSIS — E11610 Type 2 diabetes mellitus with diabetic neuropathic arthropathy: Secondary | ICD-10-CM | POA: Diagnosis not present

## 2018-08-07 DIAGNOSIS — L97522 Non-pressure chronic ulcer of other part of left foot with fat layer exposed: Secondary | ICD-10-CM | POA: Diagnosis not present

## 2018-08-07 DIAGNOSIS — D631 Anemia in chronic kidney disease: Secondary | ICD-10-CM | POA: Diagnosis not present

## 2018-08-07 DIAGNOSIS — E11621 Type 2 diabetes mellitus with foot ulcer: Secondary | ICD-10-CM | POA: Diagnosis not present

## 2018-08-10 DIAGNOSIS — E039 Hypothyroidism, unspecified: Secondary | ICD-10-CM | POA: Diagnosis not present

## 2018-08-10 DIAGNOSIS — L97821 Non-pressure chronic ulcer of other part of left lower leg limited to breakdown of skin: Secondary | ICD-10-CM | POA: Diagnosis not present

## 2018-08-10 DIAGNOSIS — I10 Essential (primary) hypertension: Secondary | ICD-10-CM | POA: Diagnosis not present

## 2018-08-10 DIAGNOSIS — S81811D Laceration without foreign body, right lower leg, subsequent encounter: Secondary | ICD-10-CM | POA: Diagnosis not present

## 2018-08-10 DIAGNOSIS — E1142 Type 2 diabetes mellitus with diabetic polyneuropathy: Secondary | ICD-10-CM | POA: Diagnosis not present

## 2018-08-10 DIAGNOSIS — L97511 Non-pressure chronic ulcer of other part of right foot limited to breakdown of skin: Secondary | ICD-10-CM | POA: Diagnosis not present

## 2018-08-10 DIAGNOSIS — L97521 Non-pressure chronic ulcer of other part of left foot limited to breakdown of skin: Secondary | ICD-10-CM | POA: Diagnosis not present

## 2018-08-10 DIAGNOSIS — E11621 Type 2 diabetes mellitus with foot ulcer: Secondary | ICD-10-CM | POA: Diagnosis not present

## 2018-08-14 DIAGNOSIS — E11621 Type 2 diabetes mellitus with foot ulcer: Secondary | ICD-10-CM | POA: Diagnosis not present

## 2018-08-14 DIAGNOSIS — E1152 Type 2 diabetes mellitus with diabetic peripheral angiopathy with gangrene: Secondary | ICD-10-CM | POA: Diagnosis not present

## 2018-08-14 DIAGNOSIS — M069 Rheumatoid arthritis, unspecified: Secondary | ICD-10-CM | POA: Diagnosis not present

## 2018-08-14 DIAGNOSIS — L97512 Non-pressure chronic ulcer of other part of right foot with fat layer exposed: Secondary | ICD-10-CM | POA: Diagnosis not present

## 2018-08-14 DIAGNOSIS — I10 Essential (primary) hypertension: Secondary | ICD-10-CM | POA: Diagnosis not present

## 2018-08-14 DIAGNOSIS — I96 Gangrene, not elsewhere classified: Secondary | ICD-10-CM | POA: Diagnosis not present

## 2018-08-14 DIAGNOSIS — L97529 Non-pressure chronic ulcer of other part of left foot with unspecified severity: Secondary | ICD-10-CM | POA: Diagnosis not present

## 2018-08-14 DIAGNOSIS — E11610 Type 2 diabetes mellitus with diabetic neuropathic arthropathy: Secondary | ICD-10-CM | POA: Diagnosis not present

## 2018-08-14 DIAGNOSIS — L97519 Non-pressure chronic ulcer of other part of right foot with unspecified severity: Secondary | ICD-10-CM | POA: Diagnosis not present

## 2018-08-14 DIAGNOSIS — B9689 Other specified bacterial agents as the cause of diseases classified elsewhere: Secondary | ICD-10-CM | POA: Diagnosis not present

## 2018-08-14 DIAGNOSIS — L97812 Non-pressure chronic ulcer of other part of right lower leg with fat layer exposed: Secondary | ICD-10-CM | POA: Diagnosis not present

## 2018-08-14 DIAGNOSIS — E11622 Type 2 diabetes mellitus with other skin ulcer: Secondary | ICD-10-CM | POA: Diagnosis not present

## 2018-08-14 DIAGNOSIS — L97522 Non-pressure chronic ulcer of other part of left foot with fat layer exposed: Secondary | ICD-10-CM | POA: Diagnosis not present

## 2018-08-14 DIAGNOSIS — L02612 Cutaneous abscess of left foot: Secondary | ICD-10-CM | POA: Diagnosis not present

## 2018-08-17 ENCOUNTER — Other Ambulatory Visit (HOSPITAL_COMMUNITY): Payer: Self-pay | Admitting: *Deleted

## 2018-08-17 DIAGNOSIS — I10 Essential (primary) hypertension: Secondary | ICD-10-CM | POA: Diagnosis not present

## 2018-08-17 DIAGNOSIS — E11621 Type 2 diabetes mellitus with foot ulcer: Secondary | ICD-10-CM | POA: Diagnosis not present

## 2018-08-17 DIAGNOSIS — E039 Hypothyroidism, unspecified: Secondary | ICD-10-CM | POA: Diagnosis not present

## 2018-08-17 DIAGNOSIS — L97511 Non-pressure chronic ulcer of other part of right foot limited to breakdown of skin: Secondary | ICD-10-CM | POA: Diagnosis not present

## 2018-08-17 DIAGNOSIS — S81811D Laceration without foreign body, right lower leg, subsequent encounter: Secondary | ICD-10-CM | POA: Diagnosis not present

## 2018-08-17 DIAGNOSIS — L97821 Non-pressure chronic ulcer of other part of left lower leg limited to breakdown of skin: Secondary | ICD-10-CM | POA: Diagnosis not present

## 2018-08-17 DIAGNOSIS — L97521 Non-pressure chronic ulcer of other part of left foot limited to breakdown of skin: Secondary | ICD-10-CM | POA: Diagnosis not present

## 2018-08-17 DIAGNOSIS — E1142 Type 2 diabetes mellitus with diabetic polyneuropathy: Secondary | ICD-10-CM | POA: Diagnosis not present

## 2018-08-18 ENCOUNTER — Ambulatory Visit (HOSPITAL_COMMUNITY)
Admission: RE | Admit: 2018-08-18 | Discharge: 2018-08-18 | Disposition: A | Discharge status: Home / Self Care | Payer: Medicare Other | Source: Ambulatory Visit | Attending: Nephrology | Admitting: Nephrology

## 2018-08-18 VITALS — BP 155/72 | HR 74 | Temp 98.6°F | Resp 20 | Ht 62.0 in | Wt 140.0 lb

## 2018-08-18 DIAGNOSIS — N183 Chronic kidney disease, stage 3 (moderate): Secondary | ICD-10-CM | POA: Insufficient documentation

## 2018-08-18 DIAGNOSIS — L97519 Non-pressure chronic ulcer of other part of right foot with unspecified severity: Secondary | ICD-10-CM | POA: Diagnosis not present

## 2018-08-18 DIAGNOSIS — L97512 Non-pressure chronic ulcer of other part of right foot with fat layer exposed: Secondary | ICD-10-CM | POA: Diagnosis not present

## 2018-08-18 DIAGNOSIS — I10 Essential (primary) hypertension: Secondary | ICD-10-CM | POA: Diagnosis not present

## 2018-08-18 DIAGNOSIS — Z5181 Encounter for therapeutic drug level monitoring: Secondary | ICD-10-CM | POA: Diagnosis not present

## 2018-08-18 DIAGNOSIS — L97529 Non-pressure chronic ulcer of other part of left foot with unspecified severity: Secondary | ICD-10-CM | POA: Diagnosis not present

## 2018-08-18 DIAGNOSIS — Z79899 Other long term (current) drug therapy: Secondary | ICD-10-CM | POA: Insufficient documentation

## 2018-08-18 DIAGNOSIS — L97812 Non-pressure chronic ulcer of other part of right lower leg with fat layer exposed: Secondary | ICD-10-CM | POA: Diagnosis not present

## 2018-08-18 DIAGNOSIS — B9689 Other specified bacterial agents as the cause of diseases classified elsewhere: Secondary | ICD-10-CM | POA: Diagnosis not present

## 2018-08-18 DIAGNOSIS — E11610 Type 2 diabetes mellitus with diabetic neuropathic arthropathy: Secondary | ICD-10-CM | POA: Diagnosis not present

## 2018-08-18 DIAGNOSIS — E11621 Type 2 diabetes mellitus with foot ulcer: Secondary | ICD-10-CM | POA: Diagnosis not present

## 2018-08-18 DIAGNOSIS — L97522 Non-pressure chronic ulcer of other part of left foot with fat layer exposed: Secondary | ICD-10-CM | POA: Diagnosis not present

## 2018-08-18 DIAGNOSIS — L02612 Cutaneous abscess of left foot: Secondary | ICD-10-CM | POA: Diagnosis not present

## 2018-08-18 DIAGNOSIS — E1152 Type 2 diabetes mellitus with diabetic peripheral angiopathy with gangrene: Secondary | ICD-10-CM | POA: Diagnosis not present

## 2018-08-18 DIAGNOSIS — D631 Anemia in chronic kidney disease: Secondary | ICD-10-CM | POA: Insufficient documentation

## 2018-08-18 DIAGNOSIS — D508 Other iron deficiency anemias: Secondary | ICD-10-CM

## 2018-08-18 DIAGNOSIS — I96 Gangrene, not elsewhere classified: Secondary | ICD-10-CM | POA: Diagnosis not present

## 2018-08-18 DIAGNOSIS — M069 Rheumatoid arthritis, unspecified: Secondary | ICD-10-CM | POA: Diagnosis not present

## 2018-08-18 DIAGNOSIS — E11622 Type 2 diabetes mellitus with other skin ulcer: Secondary | ICD-10-CM | POA: Diagnosis not present

## 2018-08-18 LAB — POCT HEMOGLOBIN-HEMACUE: Hemoglobin: 9.4 g/dL — ABNORMAL LOW (ref 12.0–15.0)

## 2018-08-18 MED ORDER — NON FORMULARY: 300.0000 mg | Freq: Once | Status: DC

## 2018-08-18 MED ORDER — SODIUM CHLORIDE 0.9 % IV SOLN
300.0000 mg | Freq: Once | INTRAVENOUS | Status: AC
  Administered 2018-08-18: 300 mg via INTRAVENOUS
  Filled 2018-08-18: qty 15

## 2018-08-18 MED ORDER — SODIUM CHLORIDE 0.9 % IV SOLN
510.0000 mg | Freq: Once | INTRAVENOUS | Status: DC
  Filled 2018-08-18: qty 17

## 2018-08-18 MED ORDER — EPOETIN ALFA-EPBX 40000 UNIT/ML IJ SOLN
30000.0000 [IU] | INTRAMUSCULAR | Status: DC
  Filled 2018-08-18: qty 1

## 2018-08-18 MED ORDER — EPOETIN ALFA-EPBX 10000 UNIT/ML IJ SOLN
30000.0000 [IU] | INTRAMUSCULAR | Status: DC
  Administered 2018-08-18: 30000 [IU] via SUBCUTANEOUS
  Filled 2018-08-18: qty 3

## 2018-08-20 DIAGNOSIS — L97521 Non-pressure chronic ulcer of other part of left foot limited to breakdown of skin: Secondary | ICD-10-CM | POA: Diagnosis not present

## 2018-08-20 DIAGNOSIS — E11621 Type 2 diabetes mellitus with foot ulcer: Secondary | ICD-10-CM | POA: Diagnosis not present

## 2018-08-20 DIAGNOSIS — E039 Hypothyroidism, unspecified: Secondary | ICD-10-CM | POA: Diagnosis not present

## 2018-08-20 DIAGNOSIS — S81811D Laceration without foreign body, right lower leg, subsequent encounter: Secondary | ICD-10-CM | POA: Diagnosis not present

## 2018-08-20 DIAGNOSIS — L97821 Non-pressure chronic ulcer of other part of left lower leg limited to breakdown of skin: Secondary | ICD-10-CM | POA: Diagnosis not present

## 2018-08-20 DIAGNOSIS — I10 Essential (primary) hypertension: Secondary | ICD-10-CM | POA: Diagnosis not present

## 2018-08-20 DIAGNOSIS — L97511 Non-pressure chronic ulcer of other part of right foot limited to breakdown of skin: Secondary | ICD-10-CM | POA: Diagnosis not present

## 2018-08-20 DIAGNOSIS — E1142 Type 2 diabetes mellitus with diabetic polyneuropathy: Secondary | ICD-10-CM | POA: Diagnosis not present

## 2018-08-21 ENCOUNTER — Other Ambulatory Visit: Payer: Self-pay | Admitting: *Deleted

## 2018-08-21 DIAGNOSIS — I779 Disorder of arteries and arterioles, unspecified: Secondary | ICD-10-CM

## 2018-08-21 DIAGNOSIS — I739 Peripheral vascular disease, unspecified: Secondary | ICD-10-CM

## 2018-08-24 ENCOUNTER — Encounter (HOSPITAL_BASED_OUTPATIENT_CLINIC_OR_DEPARTMENT_OTHER): Payer: Medicare Other | Attending: Internal Medicine

## 2018-08-24 DIAGNOSIS — L97511 Non-pressure chronic ulcer of other part of right foot limited to breakdown of skin: Secondary | ICD-10-CM | POA: Insufficient documentation

## 2018-08-24 DIAGNOSIS — S81811A Laceration without foreign body, right lower leg, initial encounter: Secondary | ICD-10-CM | POA: Insufficient documentation

## 2018-08-24 DIAGNOSIS — M069 Rheumatoid arthritis, unspecified: Secondary | ICD-10-CM | POA: Insufficient documentation

## 2018-08-24 DIAGNOSIS — X58XXXA Exposure to other specified factors, initial encounter: Secondary | ICD-10-CM | POA: Insufficient documentation

## 2018-08-24 DIAGNOSIS — L97821 Non-pressure chronic ulcer of other part of left lower leg limited to breakdown of skin: Secondary | ICD-10-CM | POA: Insufficient documentation

## 2018-08-24 DIAGNOSIS — I10 Essential (primary) hypertension: Secondary | ICD-10-CM | POA: Diagnosis not present

## 2018-08-24 DIAGNOSIS — L97518 Non-pressure chronic ulcer of other part of right foot with other specified severity: Secondary | ICD-10-CM | POA: Insufficient documentation

## 2018-08-24 DIAGNOSIS — L97521 Non-pressure chronic ulcer of other part of left foot limited to breakdown of skin: Secondary | ICD-10-CM | POA: Diagnosis not present

## 2018-08-24 DIAGNOSIS — E11621 Type 2 diabetes mellitus with foot ulcer: Secondary | ICD-10-CM | POA: Diagnosis not present

## 2018-08-24 DIAGNOSIS — L97522 Non-pressure chronic ulcer of other part of left foot with fat layer exposed: Secondary | ICD-10-CM | POA: Diagnosis not present

## 2018-08-24 DIAGNOSIS — L97512 Non-pressure chronic ulcer of other part of right foot with fat layer exposed: Secondary | ICD-10-CM | POA: Diagnosis not present

## 2018-08-26 DIAGNOSIS — E11621 Type 2 diabetes mellitus with foot ulcer: Secondary | ICD-10-CM | POA: Diagnosis not present

## 2018-08-26 DIAGNOSIS — I10 Essential (primary) hypertension: Secondary | ICD-10-CM | POA: Diagnosis not present

## 2018-08-26 DIAGNOSIS — S81811D Laceration without foreign body, right lower leg, subsequent encounter: Secondary | ICD-10-CM | POA: Diagnosis not present

## 2018-08-26 DIAGNOSIS — L97821 Non-pressure chronic ulcer of other part of left lower leg limited to breakdown of skin: Secondary | ICD-10-CM | POA: Diagnosis not present

## 2018-08-26 DIAGNOSIS — E039 Hypothyroidism, unspecified: Secondary | ICD-10-CM | POA: Diagnosis not present

## 2018-08-26 DIAGNOSIS — L97521 Non-pressure chronic ulcer of other part of left foot limited to breakdown of skin: Secondary | ICD-10-CM | POA: Diagnosis not present

## 2018-08-26 DIAGNOSIS — L97511 Non-pressure chronic ulcer of other part of right foot limited to breakdown of skin: Secondary | ICD-10-CM | POA: Diagnosis not present

## 2018-08-26 DIAGNOSIS — E1142 Type 2 diabetes mellitus with diabetic polyneuropathy: Secondary | ICD-10-CM | POA: Diagnosis not present

## 2018-08-28 DIAGNOSIS — E039 Hypothyroidism, unspecified: Secondary | ICD-10-CM | POA: Diagnosis not present

## 2018-08-28 DIAGNOSIS — E1142 Type 2 diabetes mellitus with diabetic polyneuropathy: Secondary | ICD-10-CM | POA: Diagnosis not present

## 2018-08-28 DIAGNOSIS — L97821 Non-pressure chronic ulcer of other part of left lower leg limited to breakdown of skin: Secondary | ICD-10-CM | POA: Diagnosis not present

## 2018-08-28 DIAGNOSIS — L97521 Non-pressure chronic ulcer of other part of left foot limited to breakdown of skin: Secondary | ICD-10-CM | POA: Diagnosis not present

## 2018-08-28 DIAGNOSIS — E11621 Type 2 diabetes mellitus with foot ulcer: Secondary | ICD-10-CM | POA: Diagnosis not present

## 2018-08-28 DIAGNOSIS — L97511 Non-pressure chronic ulcer of other part of right foot limited to breakdown of skin: Secondary | ICD-10-CM | POA: Diagnosis not present

## 2018-08-28 DIAGNOSIS — I10 Essential (primary) hypertension: Secondary | ICD-10-CM | POA: Diagnosis not present

## 2018-08-28 DIAGNOSIS — S81811D Laceration without foreign body, right lower leg, subsequent encounter: Secondary | ICD-10-CM | POA: Diagnosis not present

## 2018-08-31 ENCOUNTER — Ambulatory Visit (INDEPENDENT_AMBULATORY_CARE_PROVIDER_SITE_OTHER): Payer: Medicare Other | Admitting: Family Medicine

## 2018-08-31 DIAGNOSIS — E1165 Type 2 diabetes mellitus with hyperglycemia: Secondary | ICD-10-CM

## 2018-08-31 DIAGNOSIS — D508 Other iron deficiency anemias: Secondary | ICD-10-CM | POA: Diagnosis not present

## 2018-08-31 DIAGNOSIS — E11621 Type 2 diabetes mellitus with foot ulcer: Secondary | ICD-10-CM | POA: Diagnosis not present

## 2018-08-31 DIAGNOSIS — S81811A Laceration without foreign body, right lower leg, initial encounter: Secondary | ICD-10-CM | POA: Diagnosis not present

## 2018-08-31 DIAGNOSIS — I10 Essential (primary) hypertension: Secondary | ICD-10-CM | POA: Diagnosis not present

## 2018-08-31 DIAGNOSIS — L97821 Non-pressure chronic ulcer of other part of left lower leg limited to breakdown of skin: Secondary | ICD-10-CM | POA: Diagnosis not present

## 2018-08-31 DIAGNOSIS — L97518 Non-pressure chronic ulcer of other part of right foot with other specified severity: Secondary | ICD-10-CM | POA: Diagnosis not present

## 2018-08-31 DIAGNOSIS — S91104A Unspecified open wound of right lesser toe(s) without damage to nail, initial encounter: Secondary | ICD-10-CM | POA: Diagnosis not present

## 2018-08-31 DIAGNOSIS — R0600 Dyspnea, unspecified: Secondary | ICD-10-CM

## 2018-08-31 DIAGNOSIS — L97511 Non-pressure chronic ulcer of other part of right foot limited to breakdown of skin: Secondary | ICD-10-CM | POA: Diagnosis not present

## 2018-08-31 DIAGNOSIS — M069 Rheumatoid arthritis, unspecified: Secondary | ICD-10-CM | POA: Diagnosis not present

## 2018-08-31 DIAGNOSIS — S91201A Unspecified open wound of right great toe with damage to nail, initial encounter: Secondary | ICD-10-CM | POA: Diagnosis not present

## 2018-08-31 DIAGNOSIS — S81801A Unspecified open wound, right lower leg, initial encounter: Secondary | ICD-10-CM | POA: Diagnosis not present

## 2018-08-31 DIAGNOSIS — L97521 Non-pressure chronic ulcer of other part of left foot limited to breakdown of skin: Secondary | ICD-10-CM | POA: Diagnosis not present

## 2018-08-31 DIAGNOSIS — S91302A Unspecified open wound, left foot, initial encounter: Secondary | ICD-10-CM | POA: Diagnosis not present

## 2018-08-31 MED ORDER — FERROUS SULFATE 325 (65 FE) MG PO TABS: 325.0000 mg | ORAL_TABLET | ORAL | 3 refills | Status: DC

## 2018-08-31 MED ORDER — ONETOUCH DELICA LANCETS 33G MISC: 3 refills | Status: DC

## 2018-08-31 MED ORDER — PROAIR HFA 108 (90 BASE) MCG/ACT IN AERS: INHALATION_SPRAY | RESPIRATORY_TRACT | 0 refills | Status: DC

## 2018-08-31 NOTE — Progress Notes (Signed)
Date of Visit: 08/31/2018   HPI:  [name] presents for routine follow up.  Diabetes - taking glipizide 5mg  daily. I had prescribed a 5mg  pill with instructions to take 2.5mg  (half a pill) but she has been taking a whole pill on accident. Feels fine on this dose. Sugars not lower than 135 but is checking them after eating. Does not feel like sugars drop low other times. No chest pain or shortness of breath. Due for A1c in 1 month.  Albuterol refill - uses 1-2 times per month, mostly when it's hot out. Needs a refill on this.  Anemia - needs refill of iron. Gets iron infusions and takes oral iron. Has not yet had colonoscopy. Is getting lots of bills from the hospital and is being sent to collection agencies. Has not called to schedule colonoscopy.  Has been having weekly visits at wound care center for toes, says she's been diagnosed with dry gangrene on one side and a diabetic ulcer on the other. Also has home health nurse come to the house for dressing changes.  ROS: See HPI.  [place]: history of hyopthyroidism, type 2 diabetes, hypertension, RA,   PHYSICAL EXAM: BP 116/80   Pulse 88   Temp 98.1 F (36.7 C)   Wt 136 lb (61.7 kg)   SpO2 100%   BMI 24.87 kg/m  Gen: no acute distress, pleasant, cooperative, well appearing HEENT: normocephalic, atraumatic, moist mucous membranes  Heart: regular rate and rhythm, no murmur Lungs: clear to auscultation bilaterally, normal work of breathing  Neuro: alert, grossly nonfocal, speech normal Ext: bilateral feet wrapped in dressings, did not unwrap  ASSESSMENT/PLAN:  Health maintenance:  -reminded again to schedule colonoscopy  Uncontrolled type 2 diabetes mellitus with hyperglycemia ([place]) Sugars controlled by history. Advised checking fasting sugar each morning rx for lancets sent in by patient request Follow up in 1 month for A1c  Iron deficiency anemia Again reminded to schedule colonoscopy  Dyspnea Stable. Refill albuterol  inhaler.  Continue follow up with wound care center & home health for toe issues - did not assess today.  FOLLOW UP: Follow up in 1 mo for A1c  [place] J. [name], [place]

## 2018-08-31 NOTE — Assessment & Plan Note (Signed)
Sugars controlled by history. Advised checking fasting sugar each morning rx for lancets sent in by patient request Follow up in 1 month for A1c

## 2018-08-31 NOTE — Patient Instructions (Signed)
Refilled inhaler, iron tablets, and lancets  Call to schedule your colonoscopy  Follow up in 1 month to check A1c  Be well, Dr. [name]

## 2018-08-31 NOTE — Assessment & Plan Note (Signed)
Stable. Refill albuterol inhaler.

## 2018-08-31 NOTE — Assessment & Plan Note (Signed)
Again reminded to schedule colonoscopy

## 2018-09-01 ENCOUNTER — Ambulatory Visit (INDEPENDENT_AMBULATORY_CARE_PROVIDER_SITE_OTHER): Payer: Medicare Other | Admitting: Family

## 2018-09-01 ENCOUNTER — Encounter: Payer: Self-pay | Admitting: Family

## 2018-09-01 ENCOUNTER — Ambulatory Visit (HOSPITAL_COMMUNITY)
Admission: RE | Admit: 2018-09-01 | Discharge: 2018-09-01 | Disposition: A | Discharge status: Home / Self Care | Payer: Medicare Other | Source: Ambulatory Visit | Attending: Nephrology | Admitting: Nephrology

## 2018-09-01 ENCOUNTER — Other Ambulatory Visit: Payer: Self-pay

## 2018-09-01 VITALS — BP 148/78 | HR 76 | Temp 98.4°F | Resp 20

## 2018-09-01 VITALS — BP 159/81 | HR 82 | Temp 97.2°F | Resp 20 | Ht 62.0 in | Wt 136.0 lb

## 2018-09-01 DIAGNOSIS — I779 Disorder of arteries and arterioles, unspecified: Secondary | ICD-10-CM

## 2018-09-01 DIAGNOSIS — D508 Other iron deficiency anemias: Secondary | ICD-10-CM | POA: Diagnosis not present

## 2018-09-01 DIAGNOSIS — R609 Edema, unspecified: Secondary | ICD-10-CM

## 2018-09-01 LAB — IRON AND TIBC
Iron: 27 ug/dL — ABNORMAL LOW (ref 28–170)
Saturation Ratios: 17 % (ref 10.4–31.8)
TIBC: 161 ug/dL — ABNORMAL LOW (ref 250–450)
UIBC: 134 ug/dL

## 2018-09-01 LAB — FERRITIN: Ferritin: 592 ng/mL — ABNORMAL HIGH (ref 11–307)

## 2018-09-01 LAB — POCT HEMOGLOBIN-HEMACUE: HEMOGLOBIN: 9.8 g/dL — AB (ref 12.0–15.0)

## 2018-09-01 MED ORDER — EPOETIN ALFA-EPBX 10000 UNIT/ML IJ SOLN
30000.0000 [IU] | INTRAMUSCULAR | Status: DC
  Administered 2018-09-01: 30000 [IU] via SUBCUTANEOUS
  Filled 2018-09-01: qty 3

## 2018-09-01 NOTE — Patient Instructions (Addendum)
Peripheral Vascular Disease  Peripheral vascular disease (PVD) is a disease of the blood vessels that are not part of your heart and brain. A simple term for PVD is poor circulation. In most cases, PVD narrows the blood vessels that carry blood from your heart to the rest of your body. This can reduce the supply of blood to your arms, legs, and internal organs, like your stomach or kidneys. However, PVD most often affects a person's lower legs and feet. Without treatment, PVD tends to get worse. PVD can also lead to acute ischemic limb. This is when an arm or leg suddenly cannot get enough blood. This is a medical emergency. Follow these instructions at home: Lifestyle  Do not use any products that contain nicotine or tobacco, such as cigarettes and e-cigarettes. If you need help quitting, ask your doctor.  Lose weight if you are overweight. Or, stay at a healthy weight as told by your doctor.  Eat a diet that is low in fat and cholesterol. If you need help, ask your doctor.  Exercise regularly. Ask your doctor for activities that are right for you. General instructions  Take over-the-counter and prescription medicines only as told by your doctor.  Take good care of your feet: ? Wear comfortable shoes that fit well. ? Check your feet often for any cuts or sores.  Keep all follow-up visits as told by your doctor This is important. Contact a doctor if:  You have cramps in your legs when you walk.  You have leg pain when you are at rest.  You have coldness in a leg or foot.  Your skin changes.  You are unable to get or have an erection (erectile dysfunction).  You have cuts or sores on your feet that do not heal. Get help right away if:  Your arm or leg turns cold, numb, and blue.  Your arms or legs become red, warm, swollen, painful, or numb.  You have chest pain.  You have trouble breathing.  You suddenly have weakness in your face, arm, or leg.  You become very  confused or you cannot speak.  You suddenly have a very bad headache.  You suddenly cannot see. Summary  Peripheral vascular disease (PVD) is a disease of the blood vessels.  A simple term for PVD is poor circulation. Without treatment, PVD tends to get worse.  Treatment may include exercise, low fat and low cholesterol diet, and quitting smoking. This information is not intended to replace advice given to you by your health care provider. Make sure you discuss any questions you have with your health care provider. Document Released: 10/30/2009 Document Revised: 09/12/2016 Document Reviewed: 09/12/2016 Elsevier Interactive Patient Education  2019 [employer]      Edema  Edema is when you have too much fluid in your body or under your skin. Edema may make your legs, feet, and ankles swell up. Swelling is also common in looser tissues, like around your eyes. This is a common condition. It gets more common as you get older. There are many possible causes of edema. Eating too much salt (sodium) and being on your feet or sitting for a long time can cause edema in your legs, feet, and ankles. Hot weather may make edema worse. Edema is usually painless. Your skin may look swollen or shiny. Follow these instructions at home:  Keep the swollen body part raised (elevated) above the level of your heart when you are sitting or lying down.  Do not  sit still or stand for a long time.  Do not wear tight clothes. Do not wear garters on your upper legs.  Exercise your legs. This can help the swelling go down.  Wear elastic bandages or support stockings as told by your doctor.  Eat a low-salt (low-sodium) diet to reduce fluid as told by your doctor.  Depending on the cause of your swelling, you may need to limit how much fluid you drink (fluid restriction).  Take over-the-counter and prescription medicines only as told by your doctor. Contact a doctor if:  Treatment is not working.  You  have heart, liver, or kidney disease and have symptoms of edema.  You have sudden and unexplained weight gain. Get help right away if:  You have shortness of breath or chest pain.  You cannot breathe when you lie down.  You have pain, redness, or warmth in the swollen areas.  You have heart, liver, or kidney disease and get edema all of a sudden.  You have a fever and your symptoms get worse all of a sudden. Summary  Edema is when you have too much fluid in your body or under your skin.  Edema may make your legs, feet, and ankles swell up. Swelling is also common in looser tissues, like around your eyes.  Raise (elevate) the swollen body part above the level of your heart when you are sitting or lying down.  Follow your doctor's instructions about diet and how much fluid you can drink (fluid restriction). This information is not intended to replace advice given to you by your health care provider. Make sure you discuss any questions you have with your health care provider. Document Released: 01/22/2008 Document Revised: 08/23/2016 Document Reviewed: 08/23/2016 Elsevier Interactive Patient Education  2019 [employer].

## 2018-09-01 NOTE — Progress Notes (Signed)
[facility] [place]   CC: Follow up peripheral artery occlusive disease  History of Present Illness [name] is a 73 y.o. female who is s/p CO2 aortogram on 07-23-18 by Dr. Carlis Abbott re concern for mixed arterial and venous disease of the right lower extremity with necrotic right toes.  Findings: CO2 aortogram showed no significant aortoiliac disease.  After selection of the right external iliac artery CO2 aortogram of the right lower extremity showed no significant common femoral, profunda, or SFA disease.  We did use contrast to evaluate the tibials and runoff and she had patent tibial trifurcation with patent anterior tibial, peroneal, and posterior tibial arteries with filling of the dorsalis pedis in both plantar arch vessels.  No intervention was performed.  She will have some component of small vessel disease distally near her toes given her diabetes but this is not amenable to intervention.   Pt returns today for one month follow up, and wound check of toes.   Dr. Con Memos had referred pt for evaluation of arterial and venous disease with non-healing wound. She denies any claudication symptoms. She denies rest pain.   Other medical problems include schizotypal personality disorder, hypertension and thyroid disease.  When Dr. Oneida Alar evaluated pt in July 2017, thewound on her foot had nearly healed,but definitely hadevidence of peripheral arterial disease.  Venous duplex exam on05/16/2017 showed no evidence of reflux.  Dr. Oneida Alar advised pt tofollow-up with [place] in 6 months time for repeat noninvasive arterial exam. She'll follow-up sooner if she has any worsening of the wound on her foot or a new wound appears. At that point we would consider an arteriogram.   Pt was evaluated at an urgent care facility on 10-06-16 for left great toe ulcer and edema in her feet. Clindamycin prescription was provided. (review of records). Pt was advised to follow up with her PCP  and podiatrist in 2-3 days.   [facility] Dr. Con Memos, since March 2018. She is seen there every Monday, [place] ([facility]) sees her on Wednesdays and Fridays for right lower leg and toes, and left foot plantar surface dressing changes with silver alginate, betadine to right great toe.   Pt denies any claudication type symptoms, denies rest pain.   She is seeing a nephrologist; serum creatinine on 07-23-18 was 1.70 (review of records).  She states that she receives transfusions for anemia, takes iron tablets.   She received physical therapy once/week for what sounds like gait strengthening.   At today's visit pt denies pain in her lower extremities, denies fever or chills, denies abdominal pain, denies chest pain, denies dyspnea.    Diabetic: Yes, 7.5 A1C on 06-24-18 (review of records) Tobacco use: non-smoker  Pt meds include: Statin :Yes Betablocker: No ASA: No Other anticoagulants/antiplatelets: no  Past Medical History:  Diagnosis Date  . Bilateral lower extremity edema 06/06/2014  . Cataracts, bilateral 02/13/2011   Seen on eye exam 02/07/11. F/u in 12 months   . Diabetes mellitus age 10  . Diabetic ulcer of toe ([place]) 12/12/2015  . Hyperlipidemia   . Hypertension   . Retinal detachment, old, partial    left  . Retinopathy due to secondary diabetes mellitus ([place])    L>R, laser 3/07  . Schizotypal personality disorder ([place])   . Thyroid disease   . Weight loss 01/22/2012    Social History Social History   Tobacco Use  . Smoking status: Never Smoker  . Smokeless tobacco: Never Used  Substance Use Topics  . Alcohol use: No  . Drug use: No    Family History Family History  Problem Relation Age of Onset  . Heart disease Mother   . Hypertension Mother   . Heart attack Mother   . Breast cancer Other   . Colon cancer Brother   . Colon cancer Maternal Grandmother   . Pulmonary fibrosis Father   . Diabetes Daughter   . Liver disease Sister         transplant, liver  . Liver cancer Brother     Past Surgical History:  Procedure Laterality Date  . ABDOMINAL AORTOGRAM W/LOWER EXTREMITY N/A 07/23/2018   Procedure: ABDOMINAL AORTOGRAM W/LOWER EXTREMITY;  Surgeon: Marty Heck, MD;  Location: [place] [facility];  Service: Cardiovascular;  Laterality: N/A;  . BREAST EXCISIONAL BIOPSY    . BREAST SURGERY    . CATARACT EXTRACTION     left   . ROTATOR CUFF REPAIR  1990's   left  . Thyroid radiation ablation     for Graves Disease  . TRANSTHORACIC ECHOCARDIOGRAM      EF55-65%, nml - 12/14/2004    Allergies  Allergen Reactions  . Feraheme [Ferumoxytol] Itching  . Rosiglitazone Maleate Other (See Comments)    REACTION: Difficulty walking, Fatigue, shortness of breath    Current Outpatient Medications  Medication Sig Dispense Refill  . acetaminophen (TYLENOL) 325 MG tablet Take 650 mg by mouth every 6 (six) hours as needed.    . Bevacizumab (AVASTIN IV) Inject into the vein.    . ferrous sulfate 325 (65 FE) MG tablet Take 1 tablet (325 mg total) by mouth every other day. 45 tablet 3  . furosemide (LASIX) 80 MG tablet Take 160 mg by mouth 2 (two) times daily.     Marland Kitchen glipiZIDE (GLUCOTROL) 5 MG tablet Take 0.5 tablets (2.5 mg total) by mouth daily after breakfast. (Patient taking differently: Take 5 mg by mouth daily after breakfast. ) 45 tablet 1  . glucose blood (ONETOUCH VERIO) test strip Check blood sugar daily 100 each 3  . Lancet Devices (MICROLET NEXT LANCING DEVICE) MISC 1 each by Does not apply route 3 (three) times daily. Check blood sugar 3 times daily 1 each 0  . levothyroxine (SYNTHROID, LEVOTHROID) 125 MCG tablet Take 1 tablet (125 mcg total) by mouth daily. 90 tablet 3  . ONETOUCH DELICA LANCETS 25E MISC Check blood sugar once daily 100 each 3  . PROAIR HFA 108 (90 Base) MCG/ACT inhaler INHALE 2 PUFFS INTO THE LUNGS EVERY 6 HOURS AS NEEDED FOR WHEEZING OR SHORTNESS OF BREATH 8.5 Inhaler 0  . rosuvastatin  (CRESTOR) 20 MG tablet TAKE 1 TABLET BY MOUTH EVERY DAY 90 tablet 3   No current facility-administered medications for this visit.    Facility-Administered Medications Ordered in Other Visits  Medication Dose Route Frequency Provider Last Rate Last Dose  . epoetin alfa-epbx (RETACRIT) injection 30,000 Units  30,000 Units Subcutaneous Q14 Days Mauricia Area, MD   30,000 Units at 09/01/18 1010    ROS: See HPI for pertinent positives and negatives.   Physical Examination  Vitals:   09/01/18 1149  BP: (!) 159/81  Pulse: 82  Resp: 20  Temp: (!) 97.2 F (36.2 C)  SpO2: 100%  Weight: 136 lb (61.7 kg)  Height: 5\' 2"  (1.575 m)   Body mass index is 24.87 kg/m.  General: A&O x 3, WDWN, elderly female. Gait: using rolling walker with a seat, steady, slow HENT: No gross  abnormalities.  Eyes: PERRLA. Pulmonary: Respirations are non labored, limited air movement in all fields, fine rales in right base, no rhonchi or wheezes Cardiac: regular rhythm, no detected murmur.         Carotid Bruits Right Left   Negative Negative   Radial pulses are 1+ palpable bilaterally   Adominal aortic pulse is not palpable                         VASCULAR EXAM: Extremities with ischemic changes, without Gangrene; with open wounds. See photos below.  1+ pitting edema in left lower leg, medicated compression dressing on right lower leg, which was applied by wound care center yesterday.  Right great toe dry eschar shows signs of shedding this eschar.  Left foot plantar surface wound shows signs of granulation at the edges and bed of the wound.    Right foot    Right foot      Right foot    Left foot                                                                                                            LE Pulses Right Left       FEMORAL  not palpable seated in chair  not palpable        POPLITEAL  not palpable   not palpable       POSTERIOR TIBIAL  not palpable   not  palpable        DORSALIS PEDIS      ANTERIOR TIBIAL not palpable  not palpable    Abdomen: soft, NT, no palpable masses. Skin: no rashes, see Extremities. Musculoskeletal: no muscle wasting or atrophy.  Neurologic: A&O X 3; appropriate affect, Sensation is normal; MOTOR FUNCTION:  moving all extremities equally, motor strength 4/5 throughout. Speech is fluent/normal. CN 2-12 intact. Psychiatric: Thought content is normal, mood appropriate for clinical situation.    ASSESSMENT: [name] is a 73 y.o. female who presents with: peripheral artery occlusive disease most likely secondary to long term diabetes mellitus, and dependent edema of her lower legs. She denies ever using tobacco.  She is receiving wound care ar [facility] every Monday, and by [facility] on Wednesdays and Fridays as directed by the wound care center. Her wounds are slowly healing.   Pt does not c/o pain.    DATA  ABI (Date: 04/02/2018):  R:  ? ABI: [place] (was [place] on 06-05-17),  ? PT: bi ? DP: bi ? TBI:  0.54, toe pressure 99 (was 0.61)  L:  ? ABI: [place] (was [place]),  ? PT: bi ? DP: bi ? TBI: 0.60, toe pressure 110 (was 0.72) Bilateral ankle arteries remain calcified with non compressible vessels, bilateral waveforms remain biphasic, slight decline in bilateral TBI with more than adequate toe pressures for healing.    She hadlower extremitynoninvasive arterial exam in May 2017which showed calcified vessels bilaterally but biphasic to triphasic waveforms and toe pressure of 101 on the left 102 on the  right. A repeat ofthat arterial study in July 2017 showed similar findings.  Venous duplex exam 01/02/2016 showed no evidence of reflux.   ABI (Date: 09/01/2018):  None recently    PLAN:  Based on the patient's HPI and examination, pt will return to clinic in 3 months with ABI's. Continue wound care as directed by the wound care center.  I discussed in depth with the patient the nature of atherosclerosis,  and emphasized the importance of maximal medical management including strict control of blood pressure, blood glucose, and lipid levels, obtaining regular exercise, and continued cessation of smoking.  The patient is aware that without maximal medical management the underlying atherosclerotic disease process will progress, limiting the benefit of any interventions.  The patient was given information about PAD including signs, symptoms, treatment, what symptoms should prompt the patient to seek immediate medical care, and risk reduction measures to take.  Clemon Chambers, RN, MSN, FNP-C [facility] Phone: [phone]  Clinic MD: Carlis Abbott  09/01/18 12:07 PM

## 2018-09-02 DIAGNOSIS — L97821 Non-pressure chronic ulcer of other part of left lower leg limited to breakdown of skin: Secondary | ICD-10-CM | POA: Diagnosis not present

## 2018-09-02 DIAGNOSIS — L97511 Non-pressure chronic ulcer of other part of right foot limited to breakdown of skin: Secondary | ICD-10-CM | POA: Diagnosis not present

## 2018-09-02 DIAGNOSIS — E11621 Type 2 diabetes mellitus with foot ulcer: Secondary | ICD-10-CM | POA: Diagnosis not present

## 2018-09-02 DIAGNOSIS — E039 Hypothyroidism, unspecified: Secondary | ICD-10-CM | POA: Diagnosis not present

## 2018-09-02 DIAGNOSIS — S81811D Laceration without foreign body, right lower leg, subsequent encounter: Secondary | ICD-10-CM | POA: Diagnosis not present

## 2018-09-02 DIAGNOSIS — I10 Essential (primary) hypertension: Secondary | ICD-10-CM | POA: Diagnosis not present

## 2018-09-02 DIAGNOSIS — L97521 Non-pressure chronic ulcer of other part of left foot limited to breakdown of skin: Secondary | ICD-10-CM | POA: Diagnosis not present

## 2018-09-02 DIAGNOSIS — E1142 Type 2 diabetes mellitus with diabetic polyneuropathy: Secondary | ICD-10-CM | POA: Diagnosis not present

## 2018-09-06 DIAGNOSIS — S81811D Laceration without foreign body, right lower leg, subsequent encounter: Secondary | ICD-10-CM | POA: Diagnosis not present

## 2018-09-06 DIAGNOSIS — L97511 Non-pressure chronic ulcer of other part of right foot limited to breakdown of skin: Secondary | ICD-10-CM | POA: Diagnosis not present

## 2018-09-06 DIAGNOSIS — L97521 Non-pressure chronic ulcer of other part of left foot limited to breakdown of skin: Secondary | ICD-10-CM | POA: Diagnosis not present

## 2018-09-06 DIAGNOSIS — I10 Essential (primary) hypertension: Secondary | ICD-10-CM | POA: Diagnosis not present

## 2018-09-06 DIAGNOSIS — E1142 Type 2 diabetes mellitus with diabetic polyneuropathy: Secondary | ICD-10-CM | POA: Diagnosis not present

## 2018-09-06 DIAGNOSIS — E039 Hypothyroidism, unspecified: Secondary | ICD-10-CM | POA: Diagnosis not present

## 2018-09-06 DIAGNOSIS — E11621 Type 2 diabetes mellitus with foot ulcer: Secondary | ICD-10-CM | POA: Diagnosis not present

## 2018-09-06 DIAGNOSIS — L97821 Non-pressure chronic ulcer of other part of left lower leg limited to breakdown of skin: Secondary | ICD-10-CM | POA: Diagnosis not present

## 2018-09-07 DIAGNOSIS — L97522 Non-pressure chronic ulcer of other part of left foot with fat layer exposed: Secondary | ICD-10-CM | POA: Diagnosis not present

## 2018-09-07 DIAGNOSIS — S81811A Laceration without foreign body, right lower leg, initial encounter: Secondary | ICD-10-CM | POA: Diagnosis not present

## 2018-09-07 DIAGNOSIS — L97511 Non-pressure chronic ulcer of other part of right foot limited to breakdown of skin: Secondary | ICD-10-CM | POA: Diagnosis not present

## 2018-09-07 DIAGNOSIS — L97521 Non-pressure chronic ulcer of other part of left foot limited to breakdown of skin: Secondary | ICD-10-CM | POA: Diagnosis not present

## 2018-09-07 DIAGNOSIS — L97518 Non-pressure chronic ulcer of other part of right foot with other specified severity: Secondary | ICD-10-CM | POA: Diagnosis not present

## 2018-09-07 DIAGNOSIS — M069 Rheumatoid arthritis, unspecified: Secondary | ICD-10-CM | POA: Diagnosis not present

## 2018-09-07 DIAGNOSIS — I10 Essential (primary) hypertension: Secondary | ICD-10-CM | POA: Diagnosis not present

## 2018-09-07 DIAGNOSIS — L97821 Non-pressure chronic ulcer of other part of left lower leg limited to breakdown of skin: Secondary | ICD-10-CM | POA: Diagnosis not present

## 2018-09-07 DIAGNOSIS — E11621 Type 2 diabetes mellitus with foot ulcer: Secondary | ICD-10-CM | POA: Diagnosis not present

## 2018-09-09 ENCOUNTER — Ambulatory Visit: Payer: Medicare Other | Admitting: Audiology

## 2018-09-09 DIAGNOSIS — E039 Hypothyroidism, unspecified: Secondary | ICD-10-CM | POA: Diagnosis not present

## 2018-09-09 DIAGNOSIS — L97521 Non-pressure chronic ulcer of other part of left foot limited to breakdown of skin: Secondary | ICD-10-CM | POA: Diagnosis not present

## 2018-09-09 DIAGNOSIS — E1142 Type 2 diabetes mellitus with diabetic polyneuropathy: Secondary | ICD-10-CM | POA: Diagnosis not present

## 2018-09-09 DIAGNOSIS — L97511 Non-pressure chronic ulcer of other part of right foot limited to breakdown of skin: Secondary | ICD-10-CM | POA: Diagnosis not present

## 2018-09-09 DIAGNOSIS — I10 Essential (primary) hypertension: Secondary | ICD-10-CM | POA: Diagnosis not present

## 2018-09-09 DIAGNOSIS — S81811D Laceration without foreign body, right lower leg, subsequent encounter: Secondary | ICD-10-CM | POA: Diagnosis not present

## 2018-09-09 DIAGNOSIS — E11621 Type 2 diabetes mellitus with foot ulcer: Secondary | ICD-10-CM | POA: Diagnosis not present

## 2018-09-09 DIAGNOSIS — L97821 Non-pressure chronic ulcer of other part of left lower leg limited to breakdown of skin: Secondary | ICD-10-CM | POA: Diagnosis not present

## 2018-09-14 DIAGNOSIS — S81811D Laceration without foreign body, right lower leg, subsequent encounter: Secondary | ICD-10-CM | POA: Diagnosis not present

## 2018-09-14 DIAGNOSIS — I10 Essential (primary) hypertension: Secondary | ICD-10-CM | POA: Diagnosis not present

## 2018-09-14 DIAGNOSIS — L97511 Non-pressure chronic ulcer of other part of right foot limited to breakdown of skin: Secondary | ICD-10-CM | POA: Diagnosis not present

## 2018-09-14 DIAGNOSIS — E1142 Type 2 diabetes mellitus with diabetic polyneuropathy: Secondary | ICD-10-CM | POA: Diagnosis not present

## 2018-09-14 DIAGNOSIS — E11621 Type 2 diabetes mellitus with foot ulcer: Secondary | ICD-10-CM | POA: Diagnosis not present

## 2018-09-14 DIAGNOSIS — L97521 Non-pressure chronic ulcer of other part of left foot limited to breakdown of skin: Secondary | ICD-10-CM | POA: Diagnosis not present

## 2018-09-14 DIAGNOSIS — E039 Hypothyroidism, unspecified: Secondary | ICD-10-CM | POA: Diagnosis not present

## 2018-09-15 ENCOUNTER — Ambulatory Visit (HOSPITAL_COMMUNITY)
Admission: RE | Admit: 2018-09-15 | Discharge: 2018-09-15 | Disposition: A | Discharge status: Home / Self Care | Payer: Medicare Other | Source: Ambulatory Visit | Attending: Nephrology | Admitting: Nephrology

## 2018-09-15 VITALS — BP 158/72 | HR 72 | Temp 97.9°F | Resp 20

## 2018-09-15 DIAGNOSIS — Z5181 Encounter for therapeutic drug level monitoring: Secondary | ICD-10-CM | POA: Diagnosis not present

## 2018-09-15 DIAGNOSIS — D631 Anemia in chronic kidney disease: Secondary | ICD-10-CM | POA: Insufficient documentation

## 2018-09-15 DIAGNOSIS — D508 Other iron deficiency anemias: Secondary | ICD-10-CM

## 2018-09-15 DIAGNOSIS — Z79899 Other long term (current) drug therapy: Secondary | ICD-10-CM | POA: Insufficient documentation

## 2018-09-15 DIAGNOSIS — N183 Chronic kidney disease, stage 3 (moderate): Secondary | ICD-10-CM | POA: Insufficient documentation

## 2018-09-15 LAB — POCT HEMOGLOBIN-HEMACUE: Hemoglobin: 9.8 g/dL — ABNORMAL LOW (ref 12.0–15.0)

## 2018-09-15 MED ORDER — EPOETIN ALFA-EPBX 10000 UNIT/ML IJ SOLN
30000.0000 [IU] | Freq: Once | INTRAMUSCULAR | Status: AC
  Administered 2018-09-15: 30000 [IU] via SUBCUTANEOUS
  Filled 2018-09-15: qty 3

## 2018-09-15 MED ORDER — EPOETIN ALFA-EPBX 40000 UNIT/ML IJ SOLN: 30000.0000 [IU] | INTRAMUSCULAR | Status: DC

## 2018-09-16 ENCOUNTER — Ambulatory Visit: Payer: Medicare Other | Admitting: Podiatry

## 2018-09-17 DIAGNOSIS — I10 Essential (primary) hypertension: Secondary | ICD-10-CM | POA: Diagnosis not present

## 2018-09-17 DIAGNOSIS — L97511 Non-pressure chronic ulcer of other part of right foot limited to breakdown of skin: Secondary | ICD-10-CM | POA: Diagnosis not present

## 2018-09-17 DIAGNOSIS — S81811D Laceration without foreign body, right lower leg, subsequent encounter: Secondary | ICD-10-CM | POA: Diagnosis not present

## 2018-09-17 DIAGNOSIS — E11621 Type 2 diabetes mellitus with foot ulcer: Secondary | ICD-10-CM | POA: Diagnosis not present

## 2018-09-17 DIAGNOSIS — L97521 Non-pressure chronic ulcer of other part of left foot limited to breakdown of skin: Secondary | ICD-10-CM | POA: Diagnosis not present

## 2018-09-17 DIAGNOSIS — E039 Hypothyroidism, unspecified: Secondary | ICD-10-CM | POA: Diagnosis not present

## 2018-09-17 DIAGNOSIS — E1142 Type 2 diabetes mellitus with diabetic polyneuropathy: Secondary | ICD-10-CM | POA: Diagnosis not present

## 2018-09-21 ENCOUNTER — Encounter (HOSPITAL_COMMUNITY): Payer: Medicare Other

## 2018-09-21 ENCOUNTER — Encounter (HOSPITAL_BASED_OUTPATIENT_CLINIC_OR_DEPARTMENT_OTHER): Payer: Medicare Other | Attending: Internal Medicine

## 2018-09-21 ENCOUNTER — Ambulatory Visit: Payer: Medicare Other | Admitting: Family

## 2018-09-21 DIAGNOSIS — L97522 Non-pressure chronic ulcer of other part of left foot with fat layer exposed: Secondary | ICD-10-CM | POA: Diagnosis not present

## 2018-09-21 DIAGNOSIS — E11621 Type 2 diabetes mellitus with foot ulcer: Secondary | ICD-10-CM | POA: Diagnosis not present

## 2018-09-21 DIAGNOSIS — E1142 Type 2 diabetes mellitus with diabetic polyneuropathy: Secondary | ICD-10-CM | POA: Diagnosis not present

## 2018-09-21 DIAGNOSIS — I10 Essential (primary) hypertension: Secondary | ICD-10-CM | POA: Insufficient documentation

## 2018-09-21 DIAGNOSIS — E1152 Type 2 diabetes mellitus with diabetic peripheral angiopathy with gangrene: Secondary | ICD-10-CM | POA: Insufficient documentation

## 2018-09-21 DIAGNOSIS — I96 Gangrene, not elsewhere classified: Secondary | ICD-10-CM | POA: Insufficient documentation

## 2018-09-24 DIAGNOSIS — I10 Essential (primary) hypertension: Secondary | ICD-10-CM | POA: Diagnosis not present

## 2018-09-24 DIAGNOSIS — L97511 Non-pressure chronic ulcer of other part of right foot limited to breakdown of skin: Secondary | ICD-10-CM | POA: Diagnosis not present

## 2018-09-24 DIAGNOSIS — E11621 Type 2 diabetes mellitus with foot ulcer: Secondary | ICD-10-CM | POA: Diagnosis not present

## 2018-09-24 DIAGNOSIS — L97521 Non-pressure chronic ulcer of other part of left foot limited to breakdown of skin: Secondary | ICD-10-CM | POA: Diagnosis not present

## 2018-09-24 DIAGNOSIS — S81811D Laceration without foreign body, right lower leg, subsequent encounter: Secondary | ICD-10-CM | POA: Diagnosis not present

## 2018-09-24 DIAGNOSIS — E1142 Type 2 diabetes mellitus with diabetic polyneuropathy: Secondary | ICD-10-CM | POA: Diagnosis not present

## 2018-09-24 DIAGNOSIS — E039 Hypothyroidism, unspecified: Secondary | ICD-10-CM | POA: Diagnosis not present

## 2018-09-28 ENCOUNTER — Other Ambulatory Visit (HOSPITAL_COMMUNITY): Payer: Self-pay

## 2018-09-28 DIAGNOSIS — S81811D Laceration without foreign body, right lower leg, subsequent encounter: Secondary | ICD-10-CM | POA: Diagnosis not present

## 2018-09-28 DIAGNOSIS — L97521 Non-pressure chronic ulcer of other part of left foot limited to breakdown of skin: Secondary | ICD-10-CM | POA: Diagnosis not present

## 2018-09-28 DIAGNOSIS — E039 Hypothyroidism, unspecified: Secondary | ICD-10-CM | POA: Diagnosis not present

## 2018-09-28 DIAGNOSIS — L97511 Non-pressure chronic ulcer of other part of right foot limited to breakdown of skin: Secondary | ICD-10-CM | POA: Diagnosis not present

## 2018-09-28 DIAGNOSIS — E1142 Type 2 diabetes mellitus with diabetic polyneuropathy: Secondary | ICD-10-CM | POA: Diagnosis not present

## 2018-09-28 DIAGNOSIS — I10 Essential (primary) hypertension: Secondary | ICD-10-CM | POA: Diagnosis not present

## 2018-09-28 DIAGNOSIS — E11621 Type 2 diabetes mellitus with foot ulcer: Secondary | ICD-10-CM | POA: Diagnosis not present

## 2018-09-29 ENCOUNTER — Ambulatory Visit (HOSPITAL_COMMUNITY)
Admission: RE | Admit: 2018-09-29 | Discharge: 2018-09-29 | Disposition: A | Discharge status: Home / Self Care | Payer: Medicare Other | Source: Ambulatory Visit | Attending: Nephrology | Admitting: Nephrology

## 2018-09-29 VITALS — BP 168/79 | HR 78 | Temp 98.6°F | Resp 20 | Ht 62.0 in | Wt 140.0 lb

## 2018-09-29 DIAGNOSIS — D508 Other iron deficiency anemias: Secondary | ICD-10-CM

## 2018-09-29 LAB — POCT HEMOGLOBIN-HEMACUE: Hemoglobin: 9.9 g/dL — ABNORMAL LOW (ref 12.0–15.0)

## 2018-09-29 MED ORDER — EPOETIN ALFA-EPBX 10000 UNIT/ML IJ SOLN
30000.0000 [IU] | INTRAMUSCULAR | Status: DC
  Administered 2018-09-29: 30000 [IU] via SUBCUTANEOUS
  Filled 2018-09-29: qty 3

## 2018-09-29 MED ORDER — SODIUM CHLORIDE 0.9 % IV SOLN
300.0000 mg | Freq: Once | INTRAVENOUS | Status: AC
  Administered 2018-09-29: 300 mg via INTRAVENOUS
  Filled 2018-09-29: qty 15

## 2018-10-01 DIAGNOSIS — L97511 Non-pressure chronic ulcer of other part of right foot limited to breakdown of skin: Secondary | ICD-10-CM | POA: Diagnosis not present

## 2018-10-01 DIAGNOSIS — S81811D Laceration without foreign body, right lower leg, subsequent encounter: Secondary | ICD-10-CM | POA: Diagnosis not present

## 2018-10-01 DIAGNOSIS — E039 Hypothyroidism, unspecified: Secondary | ICD-10-CM | POA: Diagnosis not present

## 2018-10-01 DIAGNOSIS — I10 Essential (primary) hypertension: Secondary | ICD-10-CM | POA: Diagnosis not present

## 2018-10-01 DIAGNOSIS — E11621 Type 2 diabetes mellitus with foot ulcer: Secondary | ICD-10-CM | POA: Diagnosis not present

## 2018-10-01 DIAGNOSIS — L97521 Non-pressure chronic ulcer of other part of left foot limited to breakdown of skin: Secondary | ICD-10-CM | POA: Diagnosis not present

## 2018-10-01 DIAGNOSIS — E1142 Type 2 diabetes mellitus with diabetic polyneuropathy: Secondary | ICD-10-CM | POA: Diagnosis not present

## 2018-10-02 DIAGNOSIS — D631 Anemia in chronic kidney disease: Secondary | ICD-10-CM | POA: Diagnosis not present

## 2018-10-02 DIAGNOSIS — R809 Proteinuria, unspecified: Secondary | ICD-10-CM | POA: Diagnosis not present

## 2018-10-02 DIAGNOSIS — E1122 Type 2 diabetes mellitus with diabetic chronic kidney disease: Secondary | ICD-10-CM | POA: Diagnosis not present

## 2018-10-02 DIAGNOSIS — N183 Chronic kidney disease, stage 3 (moderate): Secondary | ICD-10-CM | POA: Diagnosis not present

## 2018-10-02 DIAGNOSIS — I129 Hypertensive chronic kidney disease with stage 1 through stage 4 chronic kidney disease, or unspecified chronic kidney disease: Secondary | ICD-10-CM | POA: Diagnosis not present

## 2018-10-05 ENCOUNTER — Ambulatory Visit (HOSPITAL_COMMUNITY)
Admission: RE | Admit: 2018-10-05 | Discharge: 2018-10-05 | Disposition: A | Discharge status: Home / Self Care | Payer: Medicare Other | Source: Ambulatory Visit | Attending: Internal Medicine | Admitting: Internal Medicine

## 2018-10-05 ENCOUNTER — Other Ambulatory Visit (HOSPITAL_COMMUNITY): Payer: Self-pay | Admitting: Internal Medicine

## 2018-10-05 DIAGNOSIS — L97521 Non-pressure chronic ulcer of other part of left foot limited to breakdown of skin: Secondary | ICD-10-CM | POA: Insufficient documentation

## 2018-10-05 DIAGNOSIS — E11621 Type 2 diabetes mellitus with foot ulcer: Secondary | ICD-10-CM | POA: Diagnosis not present

## 2018-10-05 DIAGNOSIS — I10 Essential (primary) hypertension: Secondary | ICD-10-CM | POA: Diagnosis not present

## 2018-10-05 DIAGNOSIS — L97528 Non-pressure chronic ulcer of other part of left foot with other specified severity: Secondary | ICD-10-CM | POA: Diagnosis not present

## 2018-10-05 DIAGNOSIS — E1152 Type 2 diabetes mellitus with diabetic peripheral angiopathy with gangrene: Secondary | ICD-10-CM | POA: Diagnosis not present

## 2018-10-05 DIAGNOSIS — S90421A Blister (nonthermal), right great toe, initial encounter: Secondary | ICD-10-CM | POA: Diagnosis not present

## 2018-10-05 DIAGNOSIS — S90425A Blister (nonthermal), left lesser toe(s), initial encounter: Secondary | ICD-10-CM | POA: Diagnosis not present

## 2018-10-05 DIAGNOSIS — L97522 Non-pressure chronic ulcer of other part of left foot with fat layer exposed: Secondary | ICD-10-CM | POA: Diagnosis not present

## 2018-10-05 DIAGNOSIS — I96 Gangrene, not elsewhere classified: Secondary | ICD-10-CM | POA: Diagnosis not present

## 2018-10-05 DIAGNOSIS — E1142 Type 2 diabetes mellitus with diabetic polyneuropathy: Secondary | ICD-10-CM | POA: Diagnosis not present

## 2018-10-06 ENCOUNTER — Telehealth: Payer: Self-pay | Admitting: *Deleted

## 2018-10-06 ENCOUNTER — Telehealth: Payer: Self-pay | Admitting: Podiatry

## 2018-10-06 NOTE — Telephone Encounter (Signed)
"  Give me a call."

## 2018-10-06 NOTE — Telephone Encounter (Signed)
I'm scheduled to come in tomorrow but my feet are tapped and bandaged up due to ulcers. Can I still come in for my nail trim?

## 2018-10-06 NOTE — Telephone Encounter (Signed)
I called pt and told her our doctors would be able to trim her toenails even with dressings. Pt asked if the toes with ulcers would be able to be redressed, I told her yes.

## 2018-10-07 ENCOUNTER — Ambulatory Visit: Payer: Medicare Other | Admitting: Podiatry

## 2018-10-08 ENCOUNTER — Ambulatory Visit: Payer: Medicare Other | Admitting: Family Medicine

## 2018-10-08 DIAGNOSIS — L97521 Non-pressure chronic ulcer of other part of left foot limited to breakdown of skin: Secondary | ICD-10-CM | POA: Diagnosis not present

## 2018-10-08 DIAGNOSIS — L97511 Non-pressure chronic ulcer of other part of right foot limited to breakdown of skin: Secondary | ICD-10-CM | POA: Diagnosis not present

## 2018-10-08 DIAGNOSIS — S81811D Laceration without foreign body, right lower leg, subsequent encounter: Secondary | ICD-10-CM | POA: Diagnosis not present

## 2018-10-08 DIAGNOSIS — E11621 Type 2 diabetes mellitus with foot ulcer: Secondary | ICD-10-CM | POA: Diagnosis not present

## 2018-10-08 DIAGNOSIS — E1142 Type 2 diabetes mellitus with diabetic polyneuropathy: Secondary | ICD-10-CM | POA: Diagnosis not present

## 2018-10-08 DIAGNOSIS — E039 Hypothyroidism, unspecified: Secondary | ICD-10-CM | POA: Diagnosis not present

## 2018-10-08 DIAGNOSIS — I10 Essential (primary) hypertension: Secondary | ICD-10-CM | POA: Diagnosis not present

## 2018-10-12 DIAGNOSIS — E1142 Type 2 diabetes mellitus with diabetic polyneuropathy: Secondary | ICD-10-CM | POA: Diagnosis not present

## 2018-10-12 DIAGNOSIS — E11621 Type 2 diabetes mellitus with foot ulcer: Secondary | ICD-10-CM | POA: Diagnosis not present

## 2018-10-12 DIAGNOSIS — I96 Gangrene, not elsewhere classified: Secondary | ICD-10-CM | POA: Diagnosis not present

## 2018-10-12 DIAGNOSIS — L97522 Non-pressure chronic ulcer of other part of left foot with fat layer exposed: Secondary | ICD-10-CM | POA: Diagnosis not present

## 2018-10-12 DIAGNOSIS — I10 Essential (primary) hypertension: Secondary | ICD-10-CM | POA: Diagnosis not present

## 2018-10-12 DIAGNOSIS — E1152 Type 2 diabetes mellitus with diabetic peripheral angiopathy with gangrene: Secondary | ICD-10-CM | POA: Diagnosis not present

## 2018-10-13 ENCOUNTER — Encounter (HOSPITAL_COMMUNITY): Payer: Medicare Other

## 2018-10-15 ENCOUNTER — Ambulatory Visit (HOSPITAL_COMMUNITY)
Admission: RE | Admit: 2018-10-15 | Discharge: 2018-10-15 | Disposition: A | Discharge status: Home / Self Care | Payer: Medicare Other | Source: Ambulatory Visit | Attending: Nephrology | Admitting: Nephrology

## 2018-10-15 VITALS — BP 177/78 | HR 75 | Temp 98.0°F | Resp 20

## 2018-10-15 DIAGNOSIS — D508 Other iron deficiency anemias: Secondary | ICD-10-CM | POA: Diagnosis present

## 2018-10-15 DIAGNOSIS — D631 Anemia in chronic kidney disease: Secondary | ICD-10-CM | POA: Insufficient documentation

## 2018-10-15 DIAGNOSIS — L97511 Non-pressure chronic ulcer of other part of right foot limited to breakdown of skin: Secondary | ICD-10-CM | POA: Diagnosis not present

## 2018-10-15 DIAGNOSIS — I10 Essential (primary) hypertension: Secondary | ICD-10-CM | POA: Diagnosis not present

## 2018-10-15 DIAGNOSIS — L97521 Non-pressure chronic ulcer of other part of left foot limited to breakdown of skin: Secondary | ICD-10-CM | POA: Diagnosis not present

## 2018-10-15 DIAGNOSIS — E039 Hypothyroidism, unspecified: Secondary | ICD-10-CM | POA: Diagnosis not present

## 2018-10-15 DIAGNOSIS — N183 Chronic kidney disease, stage 3 (moderate): Secondary | ICD-10-CM | POA: Diagnosis not present

## 2018-10-15 DIAGNOSIS — S81811D Laceration without foreign body, right lower leg, subsequent encounter: Secondary | ICD-10-CM | POA: Diagnosis not present

## 2018-10-15 DIAGNOSIS — E11621 Type 2 diabetes mellitus with foot ulcer: Secondary | ICD-10-CM | POA: Diagnosis not present

## 2018-10-15 DIAGNOSIS — E1142 Type 2 diabetes mellitus with diabetic polyneuropathy: Secondary | ICD-10-CM | POA: Diagnosis not present

## 2018-10-15 LAB — IRON AND TIBC
Iron: 26 ug/dL — ABNORMAL LOW (ref 28–170)
Saturation Ratios: 16 % (ref 10.4–31.8)
TIBC: 161 ug/dL — ABNORMAL LOW (ref 250–450)
UIBC: 135 ug/dL

## 2018-10-15 LAB — FERRITIN: Ferritin: 563 ng/mL — ABNORMAL HIGH (ref 11–307)

## 2018-10-15 LAB — POCT HEMOGLOBIN-HEMACUE: Hemoglobin: 9.7 g/dL — ABNORMAL LOW (ref 12.0–15.0)

## 2018-10-15 MED ORDER — EPOETIN ALFA-EPBX 10000 UNIT/ML IJ SOLN
30000.0000 [IU] | Freq: Once | INTRAMUSCULAR | Status: AC
  Administered 2018-10-15: 30000 [IU] via SUBCUTANEOUS
  Filled 2018-10-15: qty 3

## 2018-10-15 MED ORDER — EPOETIN ALFA-EPBX 40000 UNIT/ML IJ SOLN: 30000.0000 [IU] | INTRAMUSCULAR | Status: DC

## 2018-10-15 NOTE — Discharge Instructions (Signed)
Epoetin Alfa injection °What is this medicine? °EPOETIN ALFA (e POE e tin AL fa) helps your body make more red blood cells. This medicine is used to treat anemia caused by chronic kidney disease, cancer chemotherapy, or HIV-therapy. It may also be used before surgery if you have anemia. °This medicine may be used for other purposes; ask your health care provider or pharmacist if you have questions. °COMMON BRAND NAME(S): Epogen, Procrit, Retacrit °What should I tell my health care provider before I take this medicine? °They need to know if you have any of these conditions: °-cancer °-heart disease °-high blood pressure °-history of blood clots °-history of stroke °-low levels of folate, iron, or vitamin B12 in the blood °-seizures °-an unusual or allergic reaction to erythropoietin, albumin, benzyl alcohol, hamster proteins, other medicines, foods, dyes, or preservatives °-pregnant or trying to get pregnant °-breast-feeding °How should I use this medicine? °This medicine is for injection into a vein or under the skin. It is usually given by a health care professional in a hospital or clinic setting. °If you get this medicine at home, you will be taught how to prepare and give this medicine. Use exactly as directed. Take your medicine at regular intervals. Do not take your medicine more often than directed. °It is important that you put your used needles and syringes in a special sharps container. Do not put them in a trash can. If you do not have a sharps container, call your pharmacist or healthcare provider to get one. °A special MedGuide will be given to you by the pharmacist with each prescription and refill. Be sure to read this information carefully each time. °Talk to your pediatrician regarding the use of this medicine in children. While this drug may be prescribed for selected conditions, precautions do apply. °Overdosage: If you think you have taken too much of this medicine contact a poison control center  or emergency room at once. °NOTE: This medicine is only for you. Do not share this medicine with others. °What if I miss a dose? °If you miss a dose, take it as soon as you can. If it is almost time for your next dose, take only that dose. Do not take double or extra doses. °What may interact with this medicine? °Interactions have not been studied. °This list may not describe all possible interactions. Give your health care provider a list of all the medicines, herbs, non-prescription drugs, or dietary supplements you use. Also tell them if you smoke, drink alcohol, or use illegal drugs. Some items may interact with your medicine. °What should I watch for while using this medicine? °Your condition will be monitored carefully while you are receiving this medicine. °You may need blood work done while you are taking this medicine. °This medicine may cause a decrease in vitamin B6. You should make sure that you get enough vitamin B6 while you are taking this medicine. Discuss the foods you eat and the vitamins you take with your health care professional. °What side effects may I notice from receiving this medicine? °Side effects that you should report to your doctor or health care professional as soon as possible: °-allergic reactions like skin rash, itching or hives, swelling of the face, lips, or tongue °-seizures °-signs and symptoms of a blood clot such as breathing problems; changes in vision; chest pain; severe, sudden headache; pain, swelling, warmth in the leg; trouble speaking; sudden numbness or weakness of the face, arm or leg °-signs and symptoms of a stroke   like changes in vision; confusion; trouble speaking or understanding; severe headaches; sudden numbness or weakness of the face, arm or leg; trouble walking; dizziness; loss of balance or coordination °Side effects that usually do not require medical attention (report to your doctor or health care professional if they continue or are  bothersome): °-chills °-cough °-dizziness °-fever °-headaches °-joint pain °-muscle cramps °-muscle pain °-nausea, vomiting °-pain, redness, or irritation at site where injected °This list may not describe all possible side effects. Call your doctor for medical advice about side effects. You may report side effects to FDA at 1-800-FDA-1088. °Where should I keep my medicine? °Keep out of the reach of children. °Store in a refrigerator between 2 and 8 degrees C (36 and 46 degrees F). Do not freeze or shake. Throw away any unused portion if using a single-dose vial. Multi-dose vials can be kept in the refrigerator for up to 21 days after the initial dose. Throw away unused medicine. °NOTE: This sheet is a summary. It may not cover all possible information. If you have questions about this medicine, talk to your doctor, pharmacist, or health care provider. °© 2019 Elsevier/Gold Standard (2017-03-14 08:35:19) ° °

## 2018-10-19 ENCOUNTER — Encounter (HOSPITAL_BASED_OUTPATIENT_CLINIC_OR_DEPARTMENT_OTHER): Payer: Medicare Other | Attending: Internal Medicine

## 2018-10-19 DIAGNOSIS — B9561 Methicillin susceptible Staphylococcus aureus infection as the cause of diseases classified elsewhere: Secondary | ICD-10-CM | POA: Diagnosis not present

## 2018-10-19 DIAGNOSIS — L97522 Non-pressure chronic ulcer of other part of left foot with fat layer exposed: Secondary | ICD-10-CM | POA: Diagnosis not present

## 2018-10-19 DIAGNOSIS — M069 Rheumatoid arthritis, unspecified: Secondary | ICD-10-CM | POA: Diagnosis not present

## 2018-10-19 DIAGNOSIS — M86672 Other chronic osteomyelitis, left ankle and foot: Secondary | ICD-10-CM | POA: Diagnosis not present

## 2018-10-19 DIAGNOSIS — B9689 Other specified bacterial agents as the cause of diseases classified elsewhere: Secondary | ICD-10-CM | POA: Diagnosis not present

## 2018-10-19 DIAGNOSIS — I10 Essential (primary) hypertension: Secondary | ICD-10-CM | POA: Insufficient documentation

## 2018-10-19 DIAGNOSIS — L97512 Non-pressure chronic ulcer of other part of right foot with fat layer exposed: Secondary | ICD-10-CM | POA: Diagnosis not present

## 2018-10-19 DIAGNOSIS — E1169 Type 2 diabetes mellitus with other specified complication: Secondary | ICD-10-CM | POA: Diagnosis not present

## 2018-10-19 DIAGNOSIS — E11621 Type 2 diabetes mellitus with foot ulcer: Secondary | ICD-10-CM | POA: Diagnosis not present

## 2018-10-19 DIAGNOSIS — E1142 Type 2 diabetes mellitus with diabetic polyneuropathy: Secondary | ICD-10-CM | POA: Insufficient documentation

## 2018-10-22 DIAGNOSIS — L97511 Non-pressure chronic ulcer of other part of right foot limited to breakdown of skin: Secondary | ICD-10-CM | POA: Diagnosis not present

## 2018-10-22 DIAGNOSIS — E1142 Type 2 diabetes mellitus with diabetic polyneuropathy: Secondary | ICD-10-CM | POA: Diagnosis not present

## 2018-10-22 DIAGNOSIS — E039 Hypothyroidism, unspecified: Secondary | ICD-10-CM | POA: Diagnosis not present

## 2018-10-22 DIAGNOSIS — E11621 Type 2 diabetes mellitus with foot ulcer: Secondary | ICD-10-CM | POA: Diagnosis not present

## 2018-10-22 DIAGNOSIS — S81811D Laceration without foreign body, right lower leg, subsequent encounter: Secondary | ICD-10-CM | POA: Diagnosis not present

## 2018-10-22 DIAGNOSIS — L97521 Non-pressure chronic ulcer of other part of left foot limited to breakdown of skin: Secondary | ICD-10-CM | POA: Diagnosis not present

## 2018-10-22 DIAGNOSIS — I10 Essential (primary) hypertension: Secondary | ICD-10-CM | POA: Diagnosis not present

## 2018-10-26 DIAGNOSIS — M069 Rheumatoid arthritis, unspecified: Secondary | ICD-10-CM | POA: Diagnosis not present

## 2018-10-26 DIAGNOSIS — M86672 Other chronic osteomyelitis, left ankle and foot: Secondary | ICD-10-CM | POA: Diagnosis not present

## 2018-10-26 DIAGNOSIS — L97522 Non-pressure chronic ulcer of other part of left foot with fat layer exposed: Secondary | ICD-10-CM | POA: Diagnosis not present

## 2018-10-26 DIAGNOSIS — B9561 Methicillin susceptible Staphylococcus aureus infection as the cause of diseases classified elsewhere: Secondary | ICD-10-CM | POA: Diagnosis not present

## 2018-10-26 DIAGNOSIS — I10 Essential (primary) hypertension: Secondary | ICD-10-CM | POA: Diagnosis not present

## 2018-10-26 DIAGNOSIS — B9689 Other specified bacterial agents as the cause of diseases classified elsewhere: Secondary | ICD-10-CM | POA: Diagnosis not present

## 2018-10-26 DIAGNOSIS — E1169 Type 2 diabetes mellitus with other specified complication: Secondary | ICD-10-CM | POA: Diagnosis not present

## 2018-10-26 DIAGNOSIS — E11621 Type 2 diabetes mellitus with foot ulcer: Secondary | ICD-10-CM | POA: Diagnosis not present

## 2018-10-26 DIAGNOSIS — E1142 Type 2 diabetes mellitus with diabetic polyneuropathy: Secondary | ICD-10-CM | POA: Diagnosis not present

## 2018-10-26 DIAGNOSIS — L97512 Non-pressure chronic ulcer of other part of right foot with fat layer exposed: Secondary | ICD-10-CM | POA: Diagnosis not present

## 2018-10-27 ENCOUNTER — Encounter (HOSPITAL_COMMUNITY): Payer: Medicare Other

## 2018-10-27 DIAGNOSIS — H52202 Unspecified astigmatism, left eye: Secondary | ICD-10-CM | POA: Diagnosis not present

## 2018-10-27 DIAGNOSIS — H1851 Endothelial corneal dystrophy: Secondary | ICD-10-CM | POA: Diagnosis not present

## 2018-10-27 DIAGNOSIS — H25011 Cortical age-related cataract, right eye: Secondary | ICD-10-CM | POA: Diagnosis not present

## 2018-10-27 DIAGNOSIS — H25041 Posterior subcapsular polar age-related cataract, right eye: Secondary | ICD-10-CM | POA: Diagnosis not present

## 2018-10-29 ENCOUNTER — Inpatient Hospital Stay (HOSPITAL_COMMUNITY): Admission: RE | Admit: 2018-10-29 | Payer: Medicare Other | Source: Ambulatory Visit

## 2018-10-29 DIAGNOSIS — L97521 Non-pressure chronic ulcer of other part of left foot limited to breakdown of skin: Secondary | ICD-10-CM | POA: Diagnosis not present

## 2018-10-29 DIAGNOSIS — L97511 Non-pressure chronic ulcer of other part of right foot limited to breakdown of skin: Secondary | ICD-10-CM | POA: Diagnosis not present

## 2018-10-29 DIAGNOSIS — E1142 Type 2 diabetes mellitus with diabetic polyneuropathy: Secondary | ICD-10-CM | POA: Diagnosis not present

## 2018-10-29 DIAGNOSIS — I10 Essential (primary) hypertension: Secondary | ICD-10-CM | POA: Diagnosis not present

## 2018-10-29 DIAGNOSIS — E039 Hypothyroidism, unspecified: Secondary | ICD-10-CM | POA: Diagnosis not present

## 2018-10-29 DIAGNOSIS — E11621 Type 2 diabetes mellitus with foot ulcer: Secondary | ICD-10-CM | POA: Diagnosis not present

## 2018-10-29 DIAGNOSIS — S81811D Laceration without foreign body, right lower leg, subsequent encounter: Secondary | ICD-10-CM | POA: Diagnosis not present

## 2018-10-30 ENCOUNTER — Other Ambulatory Visit: Payer: Self-pay

## 2018-10-30 ENCOUNTER — Ambulatory Visit (HOSPITAL_COMMUNITY)
Admission: RE | Admit: 2018-10-30 | Discharge: 2018-10-30 | Disposition: A | Discharge status: Home / Self Care | Payer: Medicare Other | Source: Ambulatory Visit | Attending: Nephrology | Admitting: Nephrology

## 2018-10-30 VITALS — BP 142/61 | HR 79 | Temp 98.6°F | Resp 20 | Ht 62.0 in | Wt 137.0 lb

## 2018-10-30 DIAGNOSIS — Z5181 Encounter for therapeutic drug level monitoring: Secondary | ICD-10-CM | POA: Diagnosis not present

## 2018-10-30 DIAGNOSIS — N183 Chronic kidney disease, stage 3 (moderate): Secondary | ICD-10-CM | POA: Diagnosis not present

## 2018-10-30 DIAGNOSIS — D631 Anemia in chronic kidney disease: Secondary | ICD-10-CM | POA: Diagnosis not present

## 2018-10-30 DIAGNOSIS — Z79899 Other long term (current) drug therapy: Secondary | ICD-10-CM | POA: Insufficient documentation

## 2018-10-30 DIAGNOSIS — D508 Other iron deficiency anemias: Secondary | ICD-10-CM

## 2018-10-30 LAB — POCT HEMOGLOBIN-HEMACUE: Hemoglobin: 10.1 g/dL — ABNORMAL LOW (ref 12.0–15.0)

## 2018-10-30 MED ORDER — EPOETIN ALFA-EPBX 40000 UNIT/ML IJ SOLN: 40000.0000 [IU] | INTRAMUSCULAR | Status: DC

## 2018-10-30 MED ORDER — SODIUM CHLORIDE 0.9 % IV SOLN
300.0000 mg | Freq: Once | INTRAVENOUS | Status: AC
  Administered 2018-10-30: 300 mg via INTRAVENOUS
  Filled 2018-10-30: qty 15

## 2018-10-30 MED ORDER — EPOETIN ALFA-EPBX 10000 UNIT/ML IJ SOLN
40000.0000 [IU] | Freq: Once | INTRAMUSCULAR | Status: AC
  Administered 2018-10-30: 40000 [IU] via SUBCUTANEOUS
  Filled 2018-10-30: qty 4

## 2018-11-02 ENCOUNTER — Other Ambulatory Visit: Payer: Self-pay

## 2018-11-02 ENCOUNTER — Ambulatory Visit (HOSPITAL_COMMUNITY)
Admission: RE | Admit: 2018-11-02 | Discharge: 2018-11-02 | Disposition: A | Discharge status: Home / Self Care | Payer: Medicare Other | Source: Ambulatory Visit | Attending: Internal Medicine | Admitting: Internal Medicine

## 2018-11-02 ENCOUNTER — Other Ambulatory Visit (HOSPITAL_COMMUNITY): Payer: Self-pay | Admitting: Internal Medicine

## 2018-11-02 DIAGNOSIS — B9689 Other specified bacterial agents as the cause of diseases classified elsewhere: Secondary | ICD-10-CM | POA: Diagnosis not present

## 2018-11-02 DIAGNOSIS — E11621 Type 2 diabetes mellitus with foot ulcer: Secondary | ICD-10-CM | POA: Diagnosis not present

## 2018-11-02 DIAGNOSIS — L97522 Non-pressure chronic ulcer of other part of left foot with fat layer exposed: Secondary | ICD-10-CM | POA: Diagnosis not present

## 2018-11-02 DIAGNOSIS — E1169 Type 2 diabetes mellitus with other specified complication: Secondary | ICD-10-CM | POA: Diagnosis not present

## 2018-11-02 DIAGNOSIS — I10 Essential (primary) hypertension: Secondary | ICD-10-CM | POA: Diagnosis not present

## 2018-11-02 DIAGNOSIS — B9561 Methicillin susceptible Staphylococcus aureus infection as the cause of diseases classified elsewhere: Secondary | ICD-10-CM | POA: Diagnosis not present

## 2018-11-02 DIAGNOSIS — L97518 Non-pressure chronic ulcer of other part of right foot with other specified severity: Secondary | ICD-10-CM

## 2018-11-02 DIAGNOSIS — M069 Rheumatoid arthritis, unspecified: Secondary | ICD-10-CM | POA: Diagnosis not present

## 2018-11-02 DIAGNOSIS — E1142 Type 2 diabetes mellitus with diabetic polyneuropathy: Secondary | ICD-10-CM | POA: Diagnosis not present

## 2018-11-02 DIAGNOSIS — S91101A Unspecified open wound of right great toe without damage to nail, initial encounter: Secondary | ICD-10-CM | POA: Diagnosis not present

## 2018-11-02 DIAGNOSIS — M86672 Other chronic osteomyelitis, left ankle and foot: Secondary | ICD-10-CM | POA: Diagnosis not present

## 2018-11-02 DIAGNOSIS — L97512 Non-pressure chronic ulcer of other part of right foot with fat layer exposed: Secondary | ICD-10-CM | POA: Diagnosis not present

## 2018-11-03 NOTE — Telephone Encounter (Signed)
Marcy Siren called the patient.  She was inquiring about a toenail debridement.

## 2018-11-06 DIAGNOSIS — L97521 Non-pressure chronic ulcer of other part of left foot limited to breakdown of skin: Secondary | ICD-10-CM | POA: Diagnosis not present

## 2018-11-06 DIAGNOSIS — E11621 Type 2 diabetes mellitus with foot ulcer: Secondary | ICD-10-CM | POA: Diagnosis not present

## 2018-11-06 DIAGNOSIS — E039 Hypothyroidism, unspecified: Secondary | ICD-10-CM | POA: Diagnosis not present

## 2018-11-06 DIAGNOSIS — I10 Essential (primary) hypertension: Secondary | ICD-10-CM | POA: Diagnosis not present

## 2018-11-06 DIAGNOSIS — E1142 Type 2 diabetes mellitus with diabetic polyneuropathy: Secondary | ICD-10-CM | POA: Diagnosis not present

## 2018-11-06 DIAGNOSIS — S81811D Laceration without foreign body, right lower leg, subsequent encounter: Secondary | ICD-10-CM | POA: Diagnosis not present

## 2018-11-06 DIAGNOSIS — L97511 Non-pressure chronic ulcer of other part of right foot limited to breakdown of skin: Secondary | ICD-10-CM | POA: Diagnosis not present

## 2018-11-09 ENCOUNTER — Other Ambulatory Visit (HOSPITAL_BASED_OUTPATIENT_CLINIC_OR_DEPARTMENT_OTHER): Payer: Self-pay | Admitting: Internal Medicine

## 2018-11-09 ENCOUNTER — Other Ambulatory Visit (HOSPITAL_COMMUNITY)
Admission: RE | Admit: 2018-11-09 | Discharge: 2018-11-09 | Disposition: A | Discharge status: Home / Self Care | Payer: Medicare Other | Source: Other Acute Inpatient Hospital | Attending: Internal Medicine | Admitting: Internal Medicine

## 2018-11-09 DIAGNOSIS — L97512 Non-pressure chronic ulcer of other part of right foot with fat layer exposed: Secondary | ICD-10-CM | POA: Diagnosis not present

## 2018-11-09 DIAGNOSIS — B9689 Other specified bacterial agents as the cause of diseases classified elsewhere: Secondary | ICD-10-CM | POA: Diagnosis not present

## 2018-11-09 DIAGNOSIS — E1169 Type 2 diabetes mellitus with other specified complication: Secondary | ICD-10-CM | POA: Diagnosis not present

## 2018-11-09 DIAGNOSIS — M898X7 Other specified disorders of bone, ankle and foot: Secondary | ICD-10-CM | POA: Diagnosis not present

## 2018-11-09 DIAGNOSIS — B9561 Methicillin susceptible Staphylococcus aureus infection as the cause of diseases classified elsewhere: Secondary | ICD-10-CM | POA: Diagnosis not present

## 2018-11-09 DIAGNOSIS — M86672 Other chronic osteomyelitis, left ankle and foot: Secondary | ICD-10-CM | POA: Diagnosis not present

## 2018-11-09 DIAGNOSIS — L97414 Non-pressure chronic ulcer of right heel and midfoot with necrosis of bone: Secondary | ICD-10-CM | POA: Diagnosis not present

## 2018-11-09 DIAGNOSIS — L97518 Non-pressure chronic ulcer of other part of right foot with other specified severity: Secondary | ICD-10-CM | POA: Insufficient documentation

## 2018-11-09 DIAGNOSIS — M069 Rheumatoid arthritis, unspecified: Secondary | ICD-10-CM | POA: Diagnosis not present

## 2018-11-09 DIAGNOSIS — E1142 Type 2 diabetes mellitus with diabetic polyneuropathy: Secondary | ICD-10-CM | POA: Diagnosis not present

## 2018-11-09 DIAGNOSIS — E11621 Type 2 diabetes mellitus with foot ulcer: Secondary | ICD-10-CM | POA: Diagnosis not present

## 2018-11-09 DIAGNOSIS — L97521 Non-pressure chronic ulcer of other part of left foot limited to breakdown of skin: Secondary | ICD-10-CM | POA: Diagnosis not present

## 2018-11-09 DIAGNOSIS — I10 Essential (primary) hypertension: Secondary | ICD-10-CM | POA: Diagnosis not present

## 2018-11-09 DIAGNOSIS — L97522 Non-pressure chronic ulcer of other part of left foot with fat layer exposed: Secondary | ICD-10-CM | POA: Diagnosis not present

## 2018-11-10 DIAGNOSIS — L97521 Non-pressure chronic ulcer of other part of left foot limited to breakdown of skin: Secondary | ICD-10-CM | POA: Diagnosis not present

## 2018-11-10 DIAGNOSIS — S81811D Laceration without foreign body, right lower leg, subsequent encounter: Secondary | ICD-10-CM | POA: Diagnosis not present

## 2018-11-10 DIAGNOSIS — I10 Essential (primary) hypertension: Secondary | ICD-10-CM | POA: Diagnosis not present

## 2018-11-10 DIAGNOSIS — L97511 Non-pressure chronic ulcer of other part of right foot limited to breakdown of skin: Secondary | ICD-10-CM | POA: Diagnosis not present

## 2018-11-10 DIAGNOSIS — E1142 Type 2 diabetes mellitus with diabetic polyneuropathy: Secondary | ICD-10-CM | POA: Diagnosis not present

## 2018-11-10 DIAGNOSIS — E039 Hypothyroidism, unspecified: Secondary | ICD-10-CM | POA: Diagnosis not present

## 2018-11-10 DIAGNOSIS — E11621 Type 2 diabetes mellitus with foot ulcer: Secondary | ICD-10-CM | POA: Diagnosis not present

## 2018-11-12 ENCOUNTER — Ambulatory Visit: Payer: Medicare Other | Admitting: Audiology

## 2018-11-12 DIAGNOSIS — L97421 Non-pressure chronic ulcer of left heel and midfoot limited to breakdown of skin: Secondary | ICD-10-CM | POA: Diagnosis not present

## 2018-11-12 DIAGNOSIS — S91111D Laceration without foreign body of right great toe without damage to nail, subsequent encounter: Secondary | ICD-10-CM | POA: Diagnosis not present

## 2018-11-12 DIAGNOSIS — I10 Essential (primary) hypertension: Secondary | ICD-10-CM | POA: Diagnosis not present

## 2018-11-12 DIAGNOSIS — E1142 Type 2 diabetes mellitus with diabetic polyneuropathy: Secondary | ICD-10-CM | POA: Diagnosis not present

## 2018-11-12 DIAGNOSIS — S91114D Laceration without foreign body of right lesser toe(s) without damage to nail, subsequent encounter: Secondary | ICD-10-CM | POA: Diagnosis not present

## 2018-11-12 DIAGNOSIS — E11621 Type 2 diabetes mellitus with foot ulcer: Secondary | ICD-10-CM | POA: Diagnosis not present

## 2018-11-12 DIAGNOSIS — E039 Hypothyroidism, unspecified: Secondary | ICD-10-CM | POA: Diagnosis not present

## 2018-11-13 ENCOUNTER — Other Ambulatory Visit: Payer: Self-pay

## 2018-11-13 ENCOUNTER — Ambulatory Visit (HOSPITAL_COMMUNITY)
Admission: RE | Admit: 2018-11-13 | Discharge: 2018-11-13 | Disposition: A | Discharge status: Home / Self Care | Payer: Medicare Other | Source: Ambulatory Visit | Attending: Nephrology | Admitting: Nephrology

## 2018-11-13 VITALS — BP 177/81 | HR 77 | Temp 98.4°F | Resp 20

## 2018-11-13 DIAGNOSIS — N183 Chronic kidney disease, stage 3 (moderate): Secondary | ICD-10-CM | POA: Insufficient documentation

## 2018-11-13 DIAGNOSIS — D508 Other iron deficiency anemias: Secondary | ICD-10-CM

## 2018-11-13 DIAGNOSIS — D631 Anemia in chronic kidney disease: Secondary | ICD-10-CM | POA: Insufficient documentation

## 2018-11-13 LAB — IRON AND TIBC
Iron: 34 ug/dL (ref 28–170)
SATURATION RATIOS: 21 % (ref 10.4–31.8)
TIBC: 158 ug/dL — ABNORMAL LOW (ref 250–450)
UIBC: 124 ug/dL

## 2018-11-13 LAB — AEROBIC CULTURE W GRAM STAIN (SUPERFICIAL SPECIMEN)

## 2018-11-13 LAB — AEROBIC CULTURE  (SUPERFICIAL SPECIMEN)

## 2018-11-13 LAB — FERRITIN: Ferritin: 572 ng/mL — ABNORMAL HIGH (ref 11–307)

## 2018-11-13 MED ORDER — EPOETIN ALFA-EPBX 10000 UNIT/ML IJ SOLN
40000.0000 [IU] | Freq: Once | INTRAMUSCULAR | Status: AC
  Administered 2018-11-13: 40000 [IU] via SUBCUTANEOUS
  Filled 2018-11-13: qty 4

## 2018-11-13 MED ORDER — EPOETIN ALFA-EPBX 40000 UNIT/ML IJ SOLN
40000.0000 [IU] | INTRAMUSCULAR | Status: DC
  Filled 2018-11-13: qty 1

## 2018-11-16 DIAGNOSIS — S91102A Unspecified open wound of left great toe without damage to nail, initial encounter: Secondary | ICD-10-CM | POA: Diagnosis not present

## 2018-11-16 DIAGNOSIS — E1142 Type 2 diabetes mellitus with diabetic polyneuropathy: Secondary | ICD-10-CM | POA: Diagnosis not present

## 2018-11-16 DIAGNOSIS — M86672 Other chronic osteomyelitis, left ankle and foot: Secondary | ICD-10-CM | POA: Diagnosis not present

## 2018-11-16 DIAGNOSIS — B9561 Methicillin susceptible Staphylococcus aureus infection as the cause of diseases classified elsewhere: Secondary | ICD-10-CM | POA: Diagnosis not present

## 2018-11-16 DIAGNOSIS — L089 Local infection of the skin and subcutaneous tissue, unspecified: Secondary | ICD-10-CM | POA: Diagnosis not present

## 2018-11-16 DIAGNOSIS — L97512 Non-pressure chronic ulcer of other part of right foot with fat layer exposed: Secondary | ICD-10-CM | POA: Diagnosis not present

## 2018-11-16 DIAGNOSIS — B9689 Other specified bacterial agents as the cause of diseases classified elsewhere: Secondary | ICD-10-CM | POA: Diagnosis not present

## 2018-11-16 DIAGNOSIS — E1169 Type 2 diabetes mellitus with other specified complication: Secondary | ICD-10-CM | POA: Diagnosis not present

## 2018-11-16 DIAGNOSIS — E11621 Type 2 diabetes mellitus with foot ulcer: Secondary | ICD-10-CM | POA: Diagnosis not present

## 2018-11-16 DIAGNOSIS — S91104A Unspecified open wound of right lesser toe(s) without damage to nail, initial encounter: Secondary | ICD-10-CM | POA: Diagnosis not present

## 2018-11-16 DIAGNOSIS — L97522 Non-pressure chronic ulcer of other part of left foot with fat layer exposed: Secondary | ICD-10-CM | POA: Diagnosis not present

## 2018-11-16 DIAGNOSIS — I10 Essential (primary) hypertension: Secondary | ICD-10-CM | POA: Diagnosis not present

## 2018-11-16 DIAGNOSIS — M069 Rheumatoid arthritis, unspecified: Secondary | ICD-10-CM | POA: Diagnosis not present

## 2018-11-16 LAB — POCT HEMOGLOBIN-HEMACUE: Hemoglobin: 10.9 g/dL — ABNORMAL LOW (ref 12.0–15.0)

## 2018-11-19 DIAGNOSIS — S91114D Laceration without foreign body of right lesser toe(s) without damage to nail, subsequent encounter: Secondary | ICD-10-CM | POA: Diagnosis not present

## 2018-11-19 DIAGNOSIS — E11621 Type 2 diabetes mellitus with foot ulcer: Secondary | ICD-10-CM | POA: Diagnosis not present

## 2018-11-19 DIAGNOSIS — L97421 Non-pressure chronic ulcer of left heel and midfoot limited to breakdown of skin: Secondary | ICD-10-CM | POA: Diagnosis not present

## 2018-11-19 DIAGNOSIS — E039 Hypothyroidism, unspecified: Secondary | ICD-10-CM | POA: Diagnosis not present

## 2018-11-19 DIAGNOSIS — I10 Essential (primary) hypertension: Secondary | ICD-10-CM | POA: Diagnosis not present

## 2018-11-19 DIAGNOSIS — S91111D Laceration without foreign body of right great toe without damage to nail, subsequent encounter: Secondary | ICD-10-CM | POA: Diagnosis not present

## 2018-11-19 DIAGNOSIS — E1142 Type 2 diabetes mellitus with diabetic polyneuropathy: Secondary | ICD-10-CM | POA: Diagnosis not present

## 2018-11-23 ENCOUNTER — Encounter (HOSPITAL_BASED_OUTPATIENT_CLINIC_OR_DEPARTMENT_OTHER): Payer: Medicare Other | Attending: Internal Medicine

## 2018-11-23 DIAGNOSIS — I10 Essential (primary) hypertension: Secondary | ICD-10-CM | POA: Diagnosis not present

## 2018-11-23 DIAGNOSIS — E1142 Type 2 diabetes mellitus with diabetic polyneuropathy: Secondary | ICD-10-CM | POA: Insufficient documentation

## 2018-11-23 DIAGNOSIS — M069 Rheumatoid arthritis, unspecified: Secondary | ICD-10-CM | POA: Diagnosis not present

## 2018-11-23 DIAGNOSIS — L97521 Non-pressure chronic ulcer of other part of left foot limited to breakdown of skin: Secondary | ICD-10-CM | POA: Insufficient documentation

## 2018-11-23 DIAGNOSIS — E1169 Type 2 diabetes mellitus with other specified complication: Secondary | ICD-10-CM | POA: Insufficient documentation

## 2018-11-23 DIAGNOSIS — M86672 Other chronic osteomyelitis, left ankle and foot: Secondary | ICD-10-CM | POA: Diagnosis not present

## 2018-11-23 DIAGNOSIS — L97516 Non-pressure chronic ulcer of other part of right foot with bone involvement without evidence of necrosis: Secondary | ICD-10-CM | POA: Diagnosis not present

## 2018-11-23 DIAGNOSIS — E11621 Type 2 diabetes mellitus with foot ulcer: Secondary | ICD-10-CM | POA: Insufficient documentation

## 2018-11-23 DIAGNOSIS — L97522 Non-pressure chronic ulcer of other part of left foot with fat layer exposed: Secondary | ICD-10-CM | POA: Insufficient documentation

## 2018-11-23 DIAGNOSIS — L97512 Non-pressure chronic ulcer of other part of right foot with fat layer exposed: Secondary | ICD-10-CM | POA: Diagnosis not present

## 2018-11-26 ENCOUNTER — Other Ambulatory Visit (HOSPITAL_COMMUNITY): Payer: Self-pay | Admitting: *Deleted

## 2018-11-26 DIAGNOSIS — E1142 Type 2 diabetes mellitus with diabetic polyneuropathy: Secondary | ICD-10-CM | POA: Diagnosis not present

## 2018-11-26 DIAGNOSIS — I10 Essential (primary) hypertension: Secondary | ICD-10-CM | POA: Diagnosis not present

## 2018-11-26 DIAGNOSIS — S91111D Laceration without foreign body of right great toe without damage to nail, subsequent encounter: Secondary | ICD-10-CM | POA: Diagnosis not present

## 2018-11-26 DIAGNOSIS — L97421 Non-pressure chronic ulcer of left heel and midfoot limited to breakdown of skin: Secondary | ICD-10-CM | POA: Diagnosis not present

## 2018-11-26 DIAGNOSIS — E11621 Type 2 diabetes mellitus with foot ulcer: Secondary | ICD-10-CM | POA: Diagnosis not present

## 2018-11-26 DIAGNOSIS — S91114D Laceration without foreign body of right lesser toe(s) without damage to nail, subsequent encounter: Secondary | ICD-10-CM | POA: Diagnosis not present

## 2018-11-26 DIAGNOSIS — E039 Hypothyroidism, unspecified: Secondary | ICD-10-CM | POA: Diagnosis not present

## 2018-11-27 ENCOUNTER — Ambulatory Visit (HOSPITAL_COMMUNITY)
Admission: RE | Admit: 2018-11-27 | Discharge: 2018-11-27 | Disposition: A | Discharge status: Home / Self Care | Payer: Medicare Other | Source: Ambulatory Visit | Attending: Nephrology | Admitting: Nephrology

## 2018-11-27 ENCOUNTER — Other Ambulatory Visit: Payer: Self-pay

## 2018-11-27 VITALS — BP 147/68 | HR 79 | Temp 97.8°F | Resp 20 | Ht 62.0 in | Wt 140.0 lb

## 2018-11-27 DIAGNOSIS — N183 Chronic kidney disease, stage 3 (moderate): Secondary | ICD-10-CM | POA: Insufficient documentation

## 2018-11-27 DIAGNOSIS — D508 Other iron deficiency anemias: Secondary | ICD-10-CM

## 2018-11-27 DIAGNOSIS — D631 Anemia in chronic kidney disease: Secondary | ICD-10-CM | POA: Insufficient documentation

## 2018-11-27 DIAGNOSIS — E11621 Type 2 diabetes mellitus with foot ulcer: Secondary | ICD-10-CM | POA: Diagnosis not present

## 2018-11-27 LAB — POCT HEMOGLOBIN-HEMACUE: Hemoglobin: 11.3 g/dL — ABNORMAL LOW (ref 12.0–15.0)

## 2018-11-27 MED ORDER — SODIUM CHLORIDE 0.9 % IV SOLN
300.0000 mg | Freq: Once | INTRAVENOUS | Status: DC
  Filled 2018-11-27: qty 15

## 2018-11-27 MED ORDER — EPOETIN ALFA-EPBX 40000 UNIT/ML IJ SOLN
40000.0000 [IU] | INTRAMUSCULAR | Status: DC
  Administered 2018-11-27: 14:00:00 40000 [IU] via SUBCUTANEOUS
  Filled 2018-11-27: qty 1

## 2018-11-30 DIAGNOSIS — M069 Rheumatoid arthritis, unspecified: Secondary | ICD-10-CM | POA: Diagnosis not present

## 2018-11-30 DIAGNOSIS — L97522 Non-pressure chronic ulcer of other part of left foot with fat layer exposed: Secondary | ICD-10-CM | POA: Diagnosis not present

## 2018-11-30 DIAGNOSIS — L97521 Non-pressure chronic ulcer of other part of left foot limited to breakdown of skin: Secondary | ICD-10-CM | POA: Diagnosis not present

## 2018-11-30 DIAGNOSIS — E1142 Type 2 diabetes mellitus with diabetic polyneuropathy: Secondary | ICD-10-CM | POA: Diagnosis not present

## 2018-11-30 DIAGNOSIS — E11621 Type 2 diabetes mellitus with foot ulcer: Secondary | ICD-10-CM | POA: Diagnosis not present

## 2018-11-30 DIAGNOSIS — L97512 Non-pressure chronic ulcer of other part of right foot with fat layer exposed: Secondary | ICD-10-CM | POA: Diagnosis not present

## 2018-11-30 DIAGNOSIS — L97516 Non-pressure chronic ulcer of other part of right foot with bone involvement without evidence of necrosis: Secondary | ICD-10-CM | POA: Diagnosis not present

## 2018-11-30 DIAGNOSIS — M86671 Other chronic osteomyelitis, right ankle and foot: Secondary | ICD-10-CM | POA: Diagnosis not present

## 2018-11-30 DIAGNOSIS — I10 Essential (primary) hypertension: Secondary | ICD-10-CM | POA: Diagnosis not present

## 2018-11-30 DIAGNOSIS — E1169 Type 2 diabetes mellitus with other specified complication: Secondary | ICD-10-CM | POA: Diagnosis not present

## 2018-11-30 DIAGNOSIS — M86672 Other chronic osteomyelitis, left ankle and foot: Secondary | ICD-10-CM | POA: Diagnosis not present

## 2018-12-01 ENCOUNTER — Ambulatory Visit (HOSPITAL_COMMUNITY)
Admission: RE | Admit: 2018-12-01 | Discharge: 2018-12-01 | Disposition: A | Discharge status: Home / Self Care | Payer: Medicare Other | Source: Ambulatory Visit | Attending: Nephrology | Admitting: Nephrology

## 2018-12-01 ENCOUNTER — Other Ambulatory Visit: Payer: Self-pay

## 2018-12-01 DIAGNOSIS — D631 Anemia in chronic kidney disease: Secondary | ICD-10-CM | POA: Diagnosis not present

## 2018-12-01 DIAGNOSIS — N189 Chronic kidney disease, unspecified: Secondary | ICD-10-CM | POA: Insufficient documentation

## 2018-12-01 MED ORDER — SODIUM CHLORIDE 0.9 % IV SOLN
300.0000 mg | Freq: Once | INTRAVENOUS | Status: AC
  Administered 2018-12-01: 12:00:00 300 mg via INTRAVENOUS
  Filled 2018-12-01: qty 15

## 2018-12-03 ENCOUNTER — Ambulatory Visit: Payer: Medicare Other | Admitting: Family

## 2018-12-03 ENCOUNTER — Encounter (HOSPITAL_COMMUNITY): Payer: Medicare Other

## 2018-12-03 DIAGNOSIS — E11621 Type 2 diabetes mellitus with foot ulcer: Secondary | ICD-10-CM | POA: Diagnosis not present

## 2018-12-03 DIAGNOSIS — E1142 Type 2 diabetes mellitus with diabetic polyneuropathy: Secondary | ICD-10-CM | POA: Diagnosis not present

## 2018-12-03 DIAGNOSIS — I10 Essential (primary) hypertension: Secondary | ICD-10-CM | POA: Diagnosis not present

## 2018-12-03 DIAGNOSIS — S91114D Laceration without foreign body of right lesser toe(s) without damage to nail, subsequent encounter: Secondary | ICD-10-CM | POA: Diagnosis not present

## 2018-12-03 DIAGNOSIS — E039 Hypothyroidism, unspecified: Secondary | ICD-10-CM | POA: Diagnosis not present

## 2018-12-03 DIAGNOSIS — L97421 Non-pressure chronic ulcer of left heel and midfoot limited to breakdown of skin: Secondary | ICD-10-CM | POA: Diagnosis not present

## 2018-12-03 DIAGNOSIS — S91111D Laceration without foreign body of right great toe without damage to nail, subsequent encounter: Secondary | ICD-10-CM | POA: Diagnosis not present

## 2018-12-10 ENCOUNTER — Other Ambulatory Visit: Payer: Self-pay

## 2018-12-10 DIAGNOSIS — E11621 Type 2 diabetes mellitus with foot ulcer: Secondary | ICD-10-CM | POA: Diagnosis not present

## 2018-12-10 DIAGNOSIS — M069 Rheumatoid arthritis, unspecified: Secondary | ICD-10-CM | POA: Diagnosis not present

## 2018-12-10 DIAGNOSIS — I10 Essential (primary) hypertension: Secondary | ICD-10-CM | POA: Diagnosis not present

## 2018-12-10 DIAGNOSIS — E1169 Type 2 diabetes mellitus with other specified complication: Secondary | ICD-10-CM | POA: Diagnosis not present

## 2018-12-10 DIAGNOSIS — L97521 Non-pressure chronic ulcer of other part of left foot limited to breakdown of skin: Secondary | ICD-10-CM | POA: Diagnosis not present

## 2018-12-10 DIAGNOSIS — L97522 Non-pressure chronic ulcer of other part of left foot with fat layer exposed: Secondary | ICD-10-CM | POA: Diagnosis not present

## 2018-12-10 DIAGNOSIS — M86672 Other chronic osteomyelitis, left ankle and foot: Secondary | ICD-10-CM | POA: Diagnosis not present

## 2018-12-10 DIAGNOSIS — E1142 Type 2 diabetes mellitus with diabetic polyneuropathy: Secondary | ICD-10-CM | POA: Diagnosis not present

## 2018-12-10 DIAGNOSIS — L97516 Non-pressure chronic ulcer of other part of right foot with bone involvement without evidence of necrosis: Secondary | ICD-10-CM | POA: Diagnosis not present

## 2018-12-11 ENCOUNTER — Encounter (HOSPITAL_COMMUNITY): Payer: Medicare Other

## 2018-12-11 ENCOUNTER — Inpatient Hospital Stay (HOSPITAL_COMMUNITY): Admission: RE | Admit: 2018-12-11 | Payer: Medicare Other | Source: Ambulatory Visit

## 2018-12-12 DIAGNOSIS — E11621 Type 2 diabetes mellitus with foot ulcer: Secondary | ICD-10-CM | POA: Diagnosis not present

## 2018-12-12 DIAGNOSIS — I10 Essential (primary) hypertension: Secondary | ICD-10-CM | POA: Diagnosis not present

## 2018-12-12 DIAGNOSIS — E039 Hypothyroidism, unspecified: Secondary | ICD-10-CM | POA: Diagnosis not present

## 2018-12-12 DIAGNOSIS — L97421 Non-pressure chronic ulcer of left heel and midfoot limited to breakdown of skin: Secondary | ICD-10-CM | POA: Diagnosis not present

## 2018-12-12 DIAGNOSIS — S91114D Laceration without foreign body of right lesser toe(s) without damage to nail, subsequent encounter: Secondary | ICD-10-CM | POA: Diagnosis not present

## 2018-12-12 DIAGNOSIS — S91111D Laceration without foreign body of right great toe without damage to nail, subsequent encounter: Secondary | ICD-10-CM | POA: Diagnosis not present

## 2018-12-12 DIAGNOSIS — E1142 Type 2 diabetes mellitus with diabetic polyneuropathy: Secondary | ICD-10-CM | POA: Diagnosis not present

## 2018-12-15 ENCOUNTER — Encounter (HOSPITAL_COMMUNITY): Payer: Medicare Other

## 2018-12-15 ENCOUNTER — Other Ambulatory Visit: Payer: Self-pay

## 2018-12-15 ENCOUNTER — Ambulatory Visit (HOSPITAL_COMMUNITY)
Admission: RE | Admit: 2018-12-15 | Discharge: 2018-12-15 | Disposition: A | Discharge status: Home / Self Care | Payer: Medicare Other | Source: Ambulatory Visit | Attending: Nephrology | Admitting: Nephrology

## 2018-12-15 VITALS — BP 155/77 | HR 73 | Temp 97.7°F

## 2018-12-15 DIAGNOSIS — N183 Chronic kidney disease, stage 3 (moderate): Secondary | ICD-10-CM | POA: Diagnosis not present

## 2018-12-15 DIAGNOSIS — D631 Anemia in chronic kidney disease: Secondary | ICD-10-CM | POA: Diagnosis not present

## 2018-12-15 DIAGNOSIS — D508 Other iron deficiency anemias: Secondary | ICD-10-CM

## 2018-12-15 LAB — IRON AND TIBC
Iron: 34 ug/dL (ref 28–170)
Saturation Ratios: 22 % (ref 10.4–31.8)
TIBC: 157 ug/dL — ABNORMAL LOW (ref 250–450)
UIBC: 123 ug/dL

## 2018-12-15 LAB — FERRITIN: Ferritin: 665 ng/mL — ABNORMAL HIGH (ref 11–307)

## 2018-12-15 MED ORDER — EPOETIN ALFA-EPBX 10000 UNIT/ML IJ SOLN
40000.0000 [IU] | Freq: Once | INTRAMUSCULAR | Status: AC
  Administered 2018-12-15: 40000 [IU] via SUBCUTANEOUS
  Filled 2018-12-15: qty 4

## 2018-12-15 MED ORDER — EPOETIN ALFA-EPBX 40000 UNIT/ML IJ SOLN: 40000.0000 [IU] | INTRAMUSCULAR | Status: DC

## 2018-12-16 LAB — POCT HEMOGLOBIN-HEMACUE: Hemoglobin: 11.1 g/dL — ABNORMAL LOW (ref 12.0–15.0)

## 2018-12-17 DIAGNOSIS — S91111D Laceration without foreign body of right great toe without damage to nail, subsequent encounter: Secondary | ICD-10-CM | POA: Diagnosis not present

## 2018-12-17 DIAGNOSIS — E1142 Type 2 diabetes mellitus with diabetic polyneuropathy: Secondary | ICD-10-CM | POA: Diagnosis not present

## 2018-12-17 DIAGNOSIS — L97421 Non-pressure chronic ulcer of left heel and midfoot limited to breakdown of skin: Secondary | ICD-10-CM | POA: Diagnosis not present

## 2018-12-17 DIAGNOSIS — I10 Essential (primary) hypertension: Secondary | ICD-10-CM | POA: Diagnosis not present

## 2018-12-17 DIAGNOSIS — S91114D Laceration without foreign body of right lesser toe(s) without damage to nail, subsequent encounter: Secondary | ICD-10-CM | POA: Diagnosis not present

## 2018-12-17 DIAGNOSIS — E039 Hypothyroidism, unspecified: Secondary | ICD-10-CM | POA: Diagnosis not present

## 2018-12-17 DIAGNOSIS — E11621 Type 2 diabetes mellitus with foot ulcer: Secondary | ICD-10-CM | POA: Diagnosis not present

## 2018-12-18 DIAGNOSIS — E1122 Type 2 diabetes mellitus with diabetic chronic kidney disease: Secondary | ICD-10-CM | POA: Diagnosis not present

## 2018-12-18 DIAGNOSIS — N2581 Secondary hyperparathyroidism of renal origin: Secondary | ICD-10-CM | POA: Diagnosis not present

## 2018-12-18 DIAGNOSIS — I129 Hypertensive chronic kidney disease with stage 1 through stage 4 chronic kidney disease, or unspecified chronic kidney disease: Secondary | ICD-10-CM | POA: Diagnosis not present

## 2018-12-18 DIAGNOSIS — D631 Anemia in chronic kidney disease: Secondary | ICD-10-CM | POA: Diagnosis not present

## 2018-12-18 DIAGNOSIS — N183 Chronic kidney disease, stage 3 (moderate): Secondary | ICD-10-CM | POA: Diagnosis not present

## 2018-12-18 DIAGNOSIS — R809 Proteinuria, unspecified: Secondary | ICD-10-CM | POA: Diagnosis not present

## 2018-12-21 ENCOUNTER — Other Ambulatory Visit: Payer: Self-pay

## 2018-12-21 ENCOUNTER — Encounter (HOSPITAL_BASED_OUTPATIENT_CLINIC_OR_DEPARTMENT_OTHER): Payer: Medicare Other | Attending: Internal Medicine

## 2018-12-21 DIAGNOSIS — L97819 Non-pressure chronic ulcer of other part of right lower leg with unspecified severity: Secondary | ICD-10-CM | POA: Insufficient documentation

## 2018-12-21 DIAGNOSIS — E11621 Type 2 diabetes mellitus with foot ulcer: Secondary | ICD-10-CM | POA: Diagnosis not present

## 2018-12-21 DIAGNOSIS — E1169 Type 2 diabetes mellitus with other specified complication: Secondary | ICD-10-CM | POA: Diagnosis not present

## 2018-12-21 DIAGNOSIS — I10 Essential (primary) hypertension: Secondary | ICD-10-CM | POA: Insufficient documentation

## 2018-12-21 DIAGNOSIS — E1142 Type 2 diabetes mellitus with diabetic polyneuropathy: Secondary | ICD-10-CM | POA: Diagnosis not present

## 2018-12-21 DIAGNOSIS — E11622 Type 2 diabetes mellitus with other skin ulcer: Secondary | ICD-10-CM | POA: Diagnosis not present

## 2018-12-21 DIAGNOSIS — M069 Rheumatoid arthritis, unspecified: Secondary | ICD-10-CM | POA: Insufficient documentation

## 2018-12-21 DIAGNOSIS — M86672 Other chronic osteomyelitis, left ankle and foot: Secondary | ICD-10-CM | POA: Insufficient documentation

## 2018-12-21 DIAGNOSIS — L97512 Non-pressure chronic ulcer of other part of right foot with fat layer exposed: Secondary | ICD-10-CM | POA: Diagnosis not present

## 2018-12-21 DIAGNOSIS — L97529 Non-pressure chronic ulcer of other part of left foot with unspecified severity: Secondary | ICD-10-CM | POA: Diagnosis not present

## 2018-12-21 DIAGNOSIS — L97522 Non-pressure chronic ulcer of other part of left foot with fat layer exposed: Secondary | ICD-10-CM | POA: Diagnosis not present

## 2018-12-25 ENCOUNTER — Encounter (HOSPITAL_COMMUNITY): Payer: Medicare Other

## 2018-12-25 DIAGNOSIS — I10 Essential (primary) hypertension: Secondary | ICD-10-CM | POA: Diagnosis not present

## 2018-12-25 DIAGNOSIS — S91111D Laceration without foreign body of right great toe without damage to nail, subsequent encounter: Secondary | ICD-10-CM | POA: Diagnosis not present

## 2018-12-25 DIAGNOSIS — E11621 Type 2 diabetes mellitus with foot ulcer: Secondary | ICD-10-CM | POA: Diagnosis not present

## 2018-12-25 DIAGNOSIS — E039 Hypothyroidism, unspecified: Secondary | ICD-10-CM | POA: Diagnosis not present

## 2018-12-25 DIAGNOSIS — S91114D Laceration without foreign body of right lesser toe(s) without damage to nail, subsequent encounter: Secondary | ICD-10-CM | POA: Diagnosis not present

## 2018-12-25 DIAGNOSIS — L97421 Non-pressure chronic ulcer of left heel and midfoot limited to breakdown of skin: Secondary | ICD-10-CM | POA: Diagnosis not present

## 2018-12-25 DIAGNOSIS — E1142 Type 2 diabetes mellitus with diabetic polyneuropathy: Secondary | ICD-10-CM | POA: Diagnosis not present

## 2018-12-28 ENCOUNTER — Other Ambulatory Visit (HOSPITAL_COMMUNITY)
Admission: RE | Admit: 2018-12-28 | Discharge: 2018-12-28 | Disposition: A | Discharge status: Home / Self Care | Payer: Medicare Other | Source: Other Acute Inpatient Hospital | Attending: Internal Medicine | Admitting: Internal Medicine

## 2018-12-28 DIAGNOSIS — M86672 Other chronic osteomyelitis, left ankle and foot: Secondary | ICD-10-CM | POA: Diagnosis not present

## 2018-12-28 DIAGNOSIS — I10 Essential (primary) hypertension: Secondary | ICD-10-CM | POA: Diagnosis not present

## 2018-12-28 DIAGNOSIS — L97529 Non-pressure chronic ulcer of other part of left foot with unspecified severity: Secondary | ICD-10-CM | POA: Diagnosis not present

## 2018-12-28 DIAGNOSIS — L97521 Non-pressure chronic ulcer of other part of left foot limited to breakdown of skin: Secondary | ICD-10-CM | POA: Diagnosis not present

## 2018-12-28 DIAGNOSIS — E11622 Type 2 diabetes mellitus with other skin ulcer: Secondary | ICD-10-CM | POA: Diagnosis not present

## 2018-12-28 DIAGNOSIS — M069 Rheumatoid arthritis, unspecified: Secondary | ICD-10-CM | POA: Diagnosis not present

## 2018-12-28 DIAGNOSIS — E11621 Type 2 diabetes mellitus with foot ulcer: Secondary | ICD-10-CM | POA: Diagnosis not present

## 2018-12-28 DIAGNOSIS — L97819 Non-pressure chronic ulcer of other part of right lower leg with unspecified severity: Secondary | ICD-10-CM | POA: Diagnosis not present

## 2018-12-28 DIAGNOSIS — E1142 Type 2 diabetes mellitus with diabetic polyneuropathy: Secondary | ICD-10-CM | POA: Diagnosis not present

## 2018-12-28 DIAGNOSIS — L97522 Non-pressure chronic ulcer of other part of left foot with fat layer exposed: Secondary | ICD-10-CM | POA: Diagnosis not present

## 2018-12-28 DIAGNOSIS — L97512 Non-pressure chronic ulcer of other part of right foot with fat layer exposed: Secondary | ICD-10-CM | POA: Diagnosis not present

## 2018-12-28 DIAGNOSIS — E1169 Type 2 diabetes mellitus with other specified complication: Secondary | ICD-10-CM | POA: Diagnosis not present

## 2018-12-29 ENCOUNTER — Ambulatory Visit (HOSPITAL_COMMUNITY)
Admission: RE | Admit: 2018-12-29 | Discharge: 2018-12-29 | Disposition: A | Discharge status: Home / Self Care | Payer: Medicare Other | Source: Ambulatory Visit | Attending: Nephrology | Admitting: Nephrology

## 2018-12-29 VITALS — BP 152/79 | HR 71 | Temp 98.6°F | Resp 20 | Ht 62.0 in | Wt 134.0 lb

## 2018-12-29 DIAGNOSIS — D631 Anemia in chronic kidney disease: Secondary | ICD-10-CM | POA: Insufficient documentation

## 2018-12-29 DIAGNOSIS — N183 Chronic kidney disease, stage 3 (moderate): Secondary | ICD-10-CM | POA: Insufficient documentation

## 2018-12-29 DIAGNOSIS — D508 Other iron deficiency anemias: Secondary | ICD-10-CM

## 2018-12-29 LAB — POCT HEMOGLOBIN-HEMACUE: Hemoglobin: 11 g/dL — ABNORMAL LOW (ref 12.0–15.0)

## 2018-12-29 MED ORDER — EPOETIN ALFA-EPBX 10000 UNIT/ML IJ SOLN
40000.0000 [IU] | Freq: Once | INTRAMUSCULAR | Status: AC
  Administered 2018-12-29: 40000 [IU] via SUBCUTANEOUS
  Filled 2018-12-29: qty 4

## 2018-12-29 MED ORDER — SODIUM CHLORIDE 0.9 % IV SOLN
300.0000 mg | Freq: Once | INTRAVENOUS | Status: AC
  Administered 2018-12-29: 300 mg via INTRAVENOUS
  Filled 2018-12-29: qty 15

## 2018-12-29 MED ORDER — EPOETIN ALFA-EPBX 40000 UNIT/ML IJ SOLN: 40000.0000 [IU] | INTRAMUSCULAR | Status: DC

## 2018-12-31 DIAGNOSIS — L97421 Non-pressure chronic ulcer of left heel and midfoot limited to breakdown of skin: Secondary | ICD-10-CM | POA: Diagnosis not present

## 2018-12-31 DIAGNOSIS — E1142 Type 2 diabetes mellitus with diabetic polyneuropathy: Secondary | ICD-10-CM | POA: Diagnosis not present

## 2018-12-31 DIAGNOSIS — E11621 Type 2 diabetes mellitus with foot ulcer: Secondary | ICD-10-CM | POA: Diagnosis not present

## 2018-12-31 DIAGNOSIS — S91114D Laceration without foreign body of right lesser toe(s) without damage to nail, subsequent encounter: Secondary | ICD-10-CM | POA: Diagnosis not present

## 2018-12-31 DIAGNOSIS — E039 Hypothyroidism, unspecified: Secondary | ICD-10-CM | POA: Diagnosis not present

## 2018-12-31 DIAGNOSIS — S91111D Laceration without foreign body of right great toe without damage to nail, subsequent encounter: Secondary | ICD-10-CM | POA: Diagnosis not present

## 2018-12-31 DIAGNOSIS — I10 Essential (primary) hypertension: Secondary | ICD-10-CM | POA: Diagnosis not present

## 2018-12-31 LAB — AEROBIC CULTURE W GRAM STAIN (SUPERFICIAL SPECIMEN)
Culture: NORMAL
Gram Stain: NONE SEEN

## 2018-12-31 LAB — AEROBIC CULTURE? (SUPERFICIAL SPECIMEN)

## 2019-01-04 DIAGNOSIS — E1142 Type 2 diabetes mellitus with diabetic polyneuropathy: Secondary | ICD-10-CM | POA: Diagnosis not present

## 2019-01-04 DIAGNOSIS — L97512 Non-pressure chronic ulcer of other part of right foot with fat layer exposed: Secondary | ICD-10-CM | POA: Diagnosis not present

## 2019-01-04 DIAGNOSIS — L97522 Non-pressure chronic ulcer of other part of left foot with fat layer exposed: Secondary | ICD-10-CM | POA: Diagnosis not present

## 2019-01-04 DIAGNOSIS — E1169 Type 2 diabetes mellitus with other specified complication: Secondary | ICD-10-CM | POA: Diagnosis not present

## 2019-01-04 DIAGNOSIS — E11622 Type 2 diabetes mellitus with other skin ulcer: Secondary | ICD-10-CM | POA: Diagnosis not present

## 2019-01-04 DIAGNOSIS — M069 Rheumatoid arthritis, unspecified: Secondary | ICD-10-CM | POA: Diagnosis not present

## 2019-01-04 DIAGNOSIS — L97819 Non-pressure chronic ulcer of other part of right lower leg with unspecified severity: Secondary | ICD-10-CM | POA: Diagnosis not present

## 2019-01-04 DIAGNOSIS — L97529 Non-pressure chronic ulcer of other part of left foot with unspecified severity: Secondary | ICD-10-CM | POA: Diagnosis not present

## 2019-01-04 DIAGNOSIS — E11621 Type 2 diabetes mellitus with foot ulcer: Secondary | ICD-10-CM | POA: Diagnosis not present

## 2019-01-04 DIAGNOSIS — M86672 Other chronic osteomyelitis, left ankle and foot: Secondary | ICD-10-CM | POA: Diagnosis not present

## 2019-01-04 DIAGNOSIS — I10 Essential (primary) hypertension: Secondary | ICD-10-CM | POA: Diagnosis not present

## 2019-01-08 DIAGNOSIS — S91111D Laceration without foreign body of right great toe without damage to nail, subsequent encounter: Secondary | ICD-10-CM | POA: Diagnosis not present

## 2019-01-08 DIAGNOSIS — E1142 Type 2 diabetes mellitus with diabetic polyneuropathy: Secondary | ICD-10-CM | POA: Diagnosis not present

## 2019-01-08 DIAGNOSIS — S91114D Laceration without foreign body of right lesser toe(s) without damage to nail, subsequent encounter: Secondary | ICD-10-CM | POA: Diagnosis not present

## 2019-01-08 DIAGNOSIS — L97421 Non-pressure chronic ulcer of left heel and midfoot limited to breakdown of skin: Secondary | ICD-10-CM | POA: Diagnosis not present

## 2019-01-08 DIAGNOSIS — E11621 Type 2 diabetes mellitus with foot ulcer: Secondary | ICD-10-CM | POA: Diagnosis not present

## 2019-01-08 DIAGNOSIS — I10 Essential (primary) hypertension: Secondary | ICD-10-CM | POA: Diagnosis not present

## 2019-01-08 DIAGNOSIS — E039 Hypothyroidism, unspecified: Secondary | ICD-10-CM | POA: Diagnosis not present

## 2019-01-12 ENCOUNTER — Encounter (HOSPITAL_COMMUNITY): Payer: Medicare Other

## 2019-01-12 DIAGNOSIS — L97522 Non-pressure chronic ulcer of other part of left foot with fat layer exposed: Secondary | ICD-10-CM | POA: Diagnosis not present

## 2019-01-12 DIAGNOSIS — L97529 Non-pressure chronic ulcer of other part of left foot with unspecified severity: Secondary | ICD-10-CM | POA: Diagnosis not present

## 2019-01-12 DIAGNOSIS — I10 Essential (primary) hypertension: Secondary | ICD-10-CM | POA: Diagnosis not present

## 2019-01-12 DIAGNOSIS — E1142 Type 2 diabetes mellitus with diabetic polyneuropathy: Secondary | ICD-10-CM | POA: Diagnosis not present

## 2019-01-12 DIAGNOSIS — E11621 Type 2 diabetes mellitus with foot ulcer: Secondary | ICD-10-CM | POA: Diagnosis not present

## 2019-01-12 DIAGNOSIS — L97819 Non-pressure chronic ulcer of other part of right lower leg with unspecified severity: Secondary | ICD-10-CM | POA: Diagnosis not present

## 2019-01-12 DIAGNOSIS — E1169 Type 2 diabetes mellitus with other specified complication: Secondary | ICD-10-CM | POA: Diagnosis not present

## 2019-01-12 DIAGNOSIS — M069 Rheumatoid arthritis, unspecified: Secondary | ICD-10-CM | POA: Diagnosis not present

## 2019-01-12 DIAGNOSIS — L97219 Non-pressure chronic ulcer of right calf with unspecified severity: Secondary | ICD-10-CM | POA: Diagnosis not present

## 2019-01-12 DIAGNOSIS — E11622 Type 2 diabetes mellitus with other skin ulcer: Secondary | ICD-10-CM | POA: Diagnosis not present

## 2019-01-12 DIAGNOSIS — L97512 Non-pressure chronic ulcer of other part of right foot with fat layer exposed: Secondary | ICD-10-CM | POA: Diagnosis not present

## 2019-01-12 DIAGNOSIS — M86672 Other chronic osteomyelitis, left ankle and foot: Secondary | ICD-10-CM | POA: Diagnosis not present

## 2019-01-14 DIAGNOSIS — E1142 Type 2 diabetes mellitus with diabetic polyneuropathy: Secondary | ICD-10-CM | POA: Diagnosis not present

## 2019-01-14 DIAGNOSIS — E039 Hypothyroidism, unspecified: Secondary | ICD-10-CM | POA: Diagnosis not present

## 2019-01-14 DIAGNOSIS — L97421 Non-pressure chronic ulcer of left heel and midfoot limited to breakdown of skin: Secondary | ICD-10-CM | POA: Diagnosis not present

## 2019-01-14 DIAGNOSIS — S91111D Laceration without foreign body of right great toe without damage to nail, subsequent encounter: Secondary | ICD-10-CM | POA: Diagnosis not present

## 2019-01-14 DIAGNOSIS — I10 Essential (primary) hypertension: Secondary | ICD-10-CM | POA: Diagnosis not present

## 2019-01-14 DIAGNOSIS — S91114D Laceration without foreign body of right lesser toe(s) without damage to nail, subsequent encounter: Secondary | ICD-10-CM | POA: Diagnosis not present

## 2019-01-14 DIAGNOSIS — E11621 Type 2 diabetes mellitus with foot ulcer: Secondary | ICD-10-CM | POA: Diagnosis not present

## 2019-01-15 ENCOUNTER — Other Ambulatory Visit: Payer: Self-pay

## 2019-01-15 ENCOUNTER — Ambulatory Visit (HOSPITAL_COMMUNITY)
Admission: RE | Admit: 2019-01-15 | Discharge: 2019-01-15 | Disposition: A | Discharge status: Home / Self Care | Payer: Medicare Other | Source: Ambulatory Visit | Attending: Nephrology | Admitting: Nephrology

## 2019-01-15 VITALS — BP 120/68 | HR 71 | Temp 97.8°F | Resp 20

## 2019-01-15 DIAGNOSIS — N183 Chronic kidney disease, stage 3 (moderate): Secondary | ICD-10-CM | POA: Diagnosis not present

## 2019-01-15 DIAGNOSIS — D631 Anemia in chronic kidney disease: Secondary | ICD-10-CM | POA: Insufficient documentation

## 2019-01-15 DIAGNOSIS — D508 Other iron deficiency anemias: Secondary | ICD-10-CM

## 2019-01-15 LAB — IRON AND TIBC
Iron: 39 ug/dL (ref 28–170)
Saturation Ratios: 24 % (ref 10.4–31.8)
TIBC: 165 ug/dL — ABNORMAL LOW (ref 250–450)
UIBC: 126 ug/dL

## 2019-01-15 LAB — FERRITIN: Ferritin: 954 ng/mL — ABNORMAL HIGH (ref 11–307)

## 2019-01-15 LAB — POCT HEMOGLOBIN-HEMACUE: Hemoglobin: 10.7 g/dL — ABNORMAL LOW (ref 12.0–15.0)

## 2019-01-15 MED ORDER — EPOETIN ALFA-EPBX 40000 UNIT/ML IJ SOLN
40000.0000 [IU] | INTRAMUSCULAR | Status: DC
  Administered 2019-01-15: 40000 [IU] via SUBCUTANEOUS
  Filled 2019-01-15: qty 1

## 2019-01-18 ENCOUNTER — Other Ambulatory Visit: Payer: Self-pay

## 2019-01-18 ENCOUNTER — Encounter (HOSPITAL_BASED_OUTPATIENT_CLINIC_OR_DEPARTMENT_OTHER): Payer: Medicare Other | Attending: Internal Medicine

## 2019-01-18 DIAGNOSIS — L97522 Non-pressure chronic ulcer of other part of left foot with fat layer exposed: Secondary | ICD-10-CM | POA: Insufficient documentation

## 2019-01-18 DIAGNOSIS — L97819 Non-pressure chronic ulcer of other part of right lower leg with unspecified severity: Secondary | ICD-10-CM | POA: Diagnosis not present

## 2019-01-18 DIAGNOSIS — M069 Rheumatoid arthritis, unspecified: Secondary | ICD-10-CM | POA: Diagnosis not present

## 2019-01-18 DIAGNOSIS — E11621 Type 2 diabetes mellitus with foot ulcer: Secondary | ICD-10-CM | POA: Insufficient documentation

## 2019-01-18 DIAGNOSIS — I10 Essential (primary) hypertension: Secondary | ICD-10-CM | POA: Insufficient documentation

## 2019-01-18 DIAGNOSIS — L97512 Non-pressure chronic ulcer of other part of right foot with fat layer exposed: Secondary | ICD-10-CM | POA: Diagnosis not present

## 2019-01-21 DIAGNOSIS — L97421 Non-pressure chronic ulcer of left heel and midfoot limited to breakdown of skin: Secondary | ICD-10-CM | POA: Diagnosis not present

## 2019-01-21 DIAGNOSIS — I10 Essential (primary) hypertension: Secondary | ICD-10-CM | POA: Diagnosis not present

## 2019-01-21 DIAGNOSIS — S91114D Laceration without foreign body of right lesser toe(s) without damage to nail, subsequent encounter: Secondary | ICD-10-CM | POA: Diagnosis not present

## 2019-01-21 DIAGNOSIS — E11621 Type 2 diabetes mellitus with foot ulcer: Secondary | ICD-10-CM | POA: Diagnosis not present

## 2019-01-21 DIAGNOSIS — E039 Hypothyroidism, unspecified: Secondary | ICD-10-CM | POA: Diagnosis not present

## 2019-01-21 DIAGNOSIS — S91111D Laceration without foreign body of right great toe without damage to nail, subsequent encounter: Secondary | ICD-10-CM | POA: Diagnosis not present

## 2019-01-21 DIAGNOSIS — E1142 Type 2 diabetes mellitus with diabetic polyneuropathy: Secondary | ICD-10-CM | POA: Diagnosis not present

## 2019-01-25 DIAGNOSIS — I10 Essential (primary) hypertension: Secondary | ICD-10-CM | POA: Diagnosis not present

## 2019-01-25 DIAGNOSIS — L97522 Non-pressure chronic ulcer of other part of left foot with fat layer exposed: Secondary | ICD-10-CM | POA: Diagnosis not present

## 2019-01-25 DIAGNOSIS — L97819 Non-pressure chronic ulcer of other part of right lower leg with unspecified severity: Secondary | ICD-10-CM | POA: Diagnosis not present

## 2019-01-25 DIAGNOSIS — M069 Rheumatoid arthritis, unspecified: Secondary | ICD-10-CM | POA: Diagnosis not present

## 2019-01-25 DIAGNOSIS — L97512 Non-pressure chronic ulcer of other part of right foot with fat layer exposed: Secondary | ICD-10-CM | POA: Diagnosis not present

## 2019-01-25 DIAGNOSIS — E11621 Type 2 diabetes mellitus with foot ulcer: Secondary | ICD-10-CM | POA: Diagnosis not present

## 2019-01-28 DIAGNOSIS — I10 Essential (primary) hypertension: Secondary | ICD-10-CM | POA: Diagnosis not present

## 2019-01-28 DIAGNOSIS — E1142 Type 2 diabetes mellitus with diabetic polyneuropathy: Secondary | ICD-10-CM | POA: Diagnosis not present

## 2019-01-28 DIAGNOSIS — E039 Hypothyroidism, unspecified: Secondary | ICD-10-CM | POA: Diagnosis not present

## 2019-01-28 DIAGNOSIS — L97421 Non-pressure chronic ulcer of left heel and midfoot limited to breakdown of skin: Secondary | ICD-10-CM | POA: Diagnosis not present

## 2019-01-28 DIAGNOSIS — E11621 Type 2 diabetes mellitus with foot ulcer: Secondary | ICD-10-CM | POA: Diagnosis not present

## 2019-01-28 DIAGNOSIS — S91111D Laceration without foreign body of right great toe without damage to nail, subsequent encounter: Secondary | ICD-10-CM | POA: Diagnosis not present

## 2019-01-28 DIAGNOSIS — S91114D Laceration without foreign body of right lesser toe(s) without damage to nail, subsequent encounter: Secondary | ICD-10-CM | POA: Diagnosis not present

## 2019-01-29 ENCOUNTER — Other Ambulatory Visit: Payer: Self-pay

## 2019-01-29 ENCOUNTER — Ambulatory Visit (HOSPITAL_COMMUNITY)
Admission: RE | Admit: 2019-01-29 | Discharge: 2019-01-29 | Disposition: A | Discharge status: Home / Self Care | Payer: Medicare Other | Source: Ambulatory Visit | Attending: Nephrology | Admitting: Nephrology

## 2019-01-29 VITALS — BP 123/68 | HR 73 | Temp 97.1°F | Resp 20

## 2019-01-29 DIAGNOSIS — D508 Other iron deficiency anemias: Secondary | ICD-10-CM | POA: Diagnosis not present

## 2019-01-29 LAB — POCT HEMOGLOBIN-HEMACUE: Hemoglobin: 12.2 g/dL (ref 12.0–15.0)

## 2019-01-29 MED ORDER — EPOETIN ALFA-EPBX 40000 UNIT/ML IJ SOLN: 40000.0000 [IU] | INTRAMUSCULAR | Status: DC

## 2019-01-29 MED ORDER — EPOETIN ALFA-EPBX 10000 UNIT/ML IJ SOLN
40000.0000 [IU] | Freq: Once | INTRAMUSCULAR | Status: DC
  Filled 2019-01-29: qty 4

## 2019-02-01 ENCOUNTER — Other Ambulatory Visit (HOSPITAL_COMMUNITY)
Admission: RE | Admit: 2019-02-01 | Discharge: 2019-02-01 | Disposition: A | Discharge status: Home / Self Care | Payer: Medicare Other | Source: Other Acute Inpatient Hospital | Attending: Internal Medicine | Admitting: Internal Medicine

## 2019-02-01 DIAGNOSIS — L089 Local infection of the skin and subcutaneous tissue, unspecified: Secondary | ICD-10-CM | POA: Diagnosis not present

## 2019-02-01 DIAGNOSIS — L97522 Non-pressure chronic ulcer of other part of left foot with fat layer exposed: Secondary | ICD-10-CM | POA: Diagnosis not present

## 2019-02-01 DIAGNOSIS — I10 Essential (primary) hypertension: Secondary | ICD-10-CM | POA: Diagnosis not present

## 2019-02-01 DIAGNOSIS — L97521 Non-pressure chronic ulcer of other part of left foot limited to breakdown of skin: Secondary | ICD-10-CM | POA: Diagnosis not present

## 2019-02-01 DIAGNOSIS — L97819 Non-pressure chronic ulcer of other part of right lower leg with unspecified severity: Secondary | ICD-10-CM | POA: Diagnosis not present

## 2019-02-01 DIAGNOSIS — L97512 Non-pressure chronic ulcer of other part of right foot with fat layer exposed: Secondary | ICD-10-CM | POA: Diagnosis not present

## 2019-02-01 DIAGNOSIS — E11621 Type 2 diabetes mellitus with foot ulcer: Secondary | ICD-10-CM | POA: Diagnosis not present

## 2019-02-01 DIAGNOSIS — M069 Rheumatoid arthritis, unspecified: Secondary | ICD-10-CM | POA: Diagnosis not present

## 2019-02-03 ENCOUNTER — Other Ambulatory Visit (HOSPITAL_COMMUNITY): Payer: Self-pay | Admitting: Internal Medicine

## 2019-02-03 ENCOUNTER — Other Ambulatory Visit: Payer: Self-pay

## 2019-02-03 ENCOUNTER — Ambulatory Visit (HOSPITAL_COMMUNITY)
Admission: RE | Admit: 2019-02-03 | Discharge: 2019-02-03 | Disposition: A | Discharge status: Home / Self Care | Payer: Medicare Other | Source: Ambulatory Visit | Attending: Internal Medicine | Admitting: Internal Medicine

## 2019-02-03 DIAGNOSIS — L97522 Non-pressure chronic ulcer of other part of left foot with fat layer exposed: Secondary | ICD-10-CM | POA: Diagnosis not present

## 2019-02-03 DIAGNOSIS — L97529 Non-pressure chronic ulcer of other part of left foot with unspecified severity: Secondary | ICD-10-CM

## 2019-02-03 LAB — AEROBIC CULTURE W GRAM STAIN (SUPERFICIAL SPECIMEN)

## 2019-02-03 LAB — AEROBIC CULTURE? (SUPERFICIAL SPECIMEN)

## 2019-02-04 DIAGNOSIS — E039 Hypothyroidism, unspecified: Secondary | ICD-10-CM | POA: Diagnosis not present

## 2019-02-04 DIAGNOSIS — I10 Essential (primary) hypertension: Secondary | ICD-10-CM | POA: Diagnosis not present

## 2019-02-04 DIAGNOSIS — E11621 Type 2 diabetes mellitus with foot ulcer: Secondary | ICD-10-CM | POA: Diagnosis not present

## 2019-02-04 DIAGNOSIS — E1142 Type 2 diabetes mellitus with diabetic polyneuropathy: Secondary | ICD-10-CM | POA: Diagnosis not present

## 2019-02-04 DIAGNOSIS — S91111D Laceration without foreign body of right great toe without damage to nail, subsequent encounter: Secondary | ICD-10-CM | POA: Diagnosis not present

## 2019-02-04 DIAGNOSIS — L97421 Non-pressure chronic ulcer of left heel and midfoot limited to breakdown of skin: Secondary | ICD-10-CM | POA: Diagnosis not present

## 2019-02-04 DIAGNOSIS — S91114D Laceration without foreign body of right lesser toe(s) without damage to nail, subsequent encounter: Secondary | ICD-10-CM | POA: Diagnosis not present

## 2019-02-08 ENCOUNTER — Other Ambulatory Visit (HOSPITAL_COMMUNITY)
Admission: RE | Admit: 2019-02-08 | Discharge: 2019-02-08 | Disposition: A | Discharge status: Home / Self Care | Payer: Medicare Other | Source: Other Acute Inpatient Hospital | Attending: Internal Medicine | Admitting: Internal Medicine

## 2019-02-08 DIAGNOSIS — B9689 Other specified bacterial agents as the cause of diseases classified elsewhere: Secondary | ICD-10-CM | POA: Diagnosis not present

## 2019-02-08 DIAGNOSIS — M069 Rheumatoid arthritis, unspecified: Secondary | ICD-10-CM | POA: Diagnosis not present

## 2019-02-08 DIAGNOSIS — L97819 Non-pressure chronic ulcer of other part of right lower leg with unspecified severity: Secondary | ICD-10-CM | POA: Diagnosis not present

## 2019-02-08 DIAGNOSIS — L97521 Non-pressure chronic ulcer of other part of left foot limited to breakdown of skin: Secondary | ICD-10-CM | POA: Diagnosis not present

## 2019-02-08 DIAGNOSIS — L97522 Non-pressure chronic ulcer of other part of left foot with fat layer exposed: Secondary | ICD-10-CM | POA: Diagnosis not present

## 2019-02-08 DIAGNOSIS — E11621 Type 2 diabetes mellitus with foot ulcer: Secondary | ICD-10-CM | POA: Diagnosis not present

## 2019-02-08 DIAGNOSIS — L97512 Non-pressure chronic ulcer of other part of right foot with fat layer exposed: Secondary | ICD-10-CM | POA: Diagnosis not present

## 2019-02-08 DIAGNOSIS — I10 Essential (primary) hypertension: Secondary | ICD-10-CM | POA: Diagnosis not present

## 2019-02-11 DIAGNOSIS — E11621 Type 2 diabetes mellitus with foot ulcer: Secondary | ICD-10-CM | POA: Diagnosis not present

## 2019-02-11 DIAGNOSIS — L97512 Non-pressure chronic ulcer of other part of right foot with fat layer exposed: Secondary | ICD-10-CM | POA: Diagnosis not present

## 2019-02-11 DIAGNOSIS — L97522 Non-pressure chronic ulcer of other part of left foot with fat layer exposed: Secondary | ICD-10-CM | POA: Diagnosis not present

## 2019-02-11 DIAGNOSIS — B9689 Other specified bacterial agents as the cause of diseases classified elsewhere: Secondary | ICD-10-CM | POA: Diagnosis not present

## 2019-02-11 DIAGNOSIS — I10 Essential (primary) hypertension: Secondary | ICD-10-CM | POA: Diagnosis not present

## 2019-02-11 DIAGNOSIS — M069 Rheumatoid arthritis, unspecified: Secondary | ICD-10-CM | POA: Diagnosis not present

## 2019-02-11 DIAGNOSIS — L97819 Non-pressure chronic ulcer of other part of right lower leg with unspecified severity: Secondary | ICD-10-CM | POA: Diagnosis not present

## 2019-02-11 LAB — AEROBIC CULTURE W GRAM STAIN (SUPERFICIAL SPECIMEN)

## 2019-02-12 ENCOUNTER — Other Ambulatory Visit: Payer: Self-pay | Admitting: Internal Medicine

## 2019-02-12 ENCOUNTER — Ambulatory Visit (HOSPITAL_COMMUNITY)
Admission: RE | Admit: 2019-02-12 | Discharge: 2019-02-12 | Disposition: A | Discharge status: Home / Self Care | Payer: Medicare Other | Source: Ambulatory Visit | Attending: Nephrology | Admitting: Nephrology

## 2019-02-12 ENCOUNTER — Other Ambulatory Visit: Payer: Self-pay

## 2019-02-12 VITALS — BP 124/66 | HR 72 | Temp 97.8°F | Resp 20

## 2019-02-12 DIAGNOSIS — E13621 Other specified diabetes mellitus with foot ulcer: Secondary | ICD-10-CM

## 2019-02-12 DIAGNOSIS — N183 Chronic kidney disease, stage 3 (moderate): Secondary | ICD-10-CM | POA: Insufficient documentation

## 2019-02-12 DIAGNOSIS — D508 Other iron deficiency anemias: Secondary | ICD-10-CM

## 2019-02-12 DIAGNOSIS — D631 Anemia in chronic kidney disease: Secondary | ICD-10-CM | POA: Insufficient documentation

## 2019-02-12 LAB — FERRITIN: Ferritin: 666 ng/mL — ABNORMAL HIGH (ref 11–307)

## 2019-02-12 LAB — IRON AND TIBC
Iron: 46 ug/dL (ref 28–170)
Saturation Ratios: 35 % — ABNORMAL HIGH (ref 10.4–31.8)
TIBC: 130 ug/dL — ABNORMAL LOW (ref 250–450)
UIBC: 84 ug/dL

## 2019-02-12 LAB — POCT HEMOGLOBIN-HEMACUE: Hemoglobin: 11.2 g/dL — ABNORMAL LOW (ref 12.0–15.0)

## 2019-02-12 MED ORDER — EPOETIN ALFA-EPBX 40000 UNIT/ML IJ SOLN
40000.0000 [IU] | INTRAMUSCULAR | Status: DC
  Administered 2019-02-12: 40000 [IU] via SUBCUTANEOUS
  Filled 2019-02-12: qty 1

## 2019-02-15 DIAGNOSIS — L97522 Non-pressure chronic ulcer of other part of left foot with fat layer exposed: Secondary | ICD-10-CM | POA: Diagnosis not present

## 2019-02-15 DIAGNOSIS — L97819 Non-pressure chronic ulcer of other part of right lower leg with unspecified severity: Secondary | ICD-10-CM | POA: Diagnosis not present

## 2019-02-15 DIAGNOSIS — M069 Rheumatoid arthritis, unspecified: Secondary | ICD-10-CM | POA: Diagnosis not present

## 2019-02-15 DIAGNOSIS — E11621 Type 2 diabetes mellitus with foot ulcer: Secondary | ICD-10-CM | POA: Diagnosis not present

## 2019-02-15 DIAGNOSIS — L97512 Non-pressure chronic ulcer of other part of right foot with fat layer exposed: Secondary | ICD-10-CM | POA: Diagnosis not present

## 2019-02-15 DIAGNOSIS — I10 Essential (primary) hypertension: Secondary | ICD-10-CM | POA: Diagnosis not present

## 2019-02-18 DIAGNOSIS — E11621 Type 2 diabetes mellitus with foot ulcer: Secondary | ICD-10-CM | POA: Diagnosis not present

## 2019-02-18 DIAGNOSIS — I10 Essential (primary) hypertension: Secondary | ICD-10-CM | POA: Diagnosis not present

## 2019-02-18 DIAGNOSIS — L97421 Non-pressure chronic ulcer of left heel and midfoot limited to breakdown of skin: Secondary | ICD-10-CM | POA: Diagnosis not present

## 2019-02-18 DIAGNOSIS — S91111D Laceration without foreign body of right great toe without damage to nail, subsequent encounter: Secondary | ICD-10-CM | POA: Diagnosis not present

## 2019-02-18 DIAGNOSIS — E039 Hypothyroidism, unspecified: Secondary | ICD-10-CM | POA: Diagnosis not present

## 2019-02-18 DIAGNOSIS — E1142 Type 2 diabetes mellitus with diabetic polyneuropathy: Secondary | ICD-10-CM | POA: Diagnosis not present

## 2019-02-18 DIAGNOSIS — S91114D Laceration without foreign body of right lesser toe(s) without damage to nail, subsequent encounter: Secondary | ICD-10-CM | POA: Diagnosis not present

## 2019-02-22 ENCOUNTER — Encounter (HOSPITAL_BASED_OUTPATIENT_CLINIC_OR_DEPARTMENT_OTHER): Payer: Medicare Other | Attending: Internal Medicine

## 2019-02-22 DIAGNOSIS — M069 Rheumatoid arthritis, unspecified: Secondary | ICD-10-CM | POA: Diagnosis not present

## 2019-02-22 DIAGNOSIS — L97512 Non-pressure chronic ulcer of other part of right foot with fat layer exposed: Secondary | ICD-10-CM | POA: Diagnosis not present

## 2019-02-22 DIAGNOSIS — I10 Essential (primary) hypertension: Secondary | ICD-10-CM | POA: Insufficient documentation

## 2019-02-22 DIAGNOSIS — L97522 Non-pressure chronic ulcer of other part of left foot with fat layer exposed: Secondary | ICD-10-CM | POA: Diagnosis not present

## 2019-02-22 DIAGNOSIS — E1142 Type 2 diabetes mellitus with diabetic polyneuropathy: Secondary | ICD-10-CM | POA: Diagnosis not present

## 2019-02-23 DIAGNOSIS — N183 Chronic kidney disease, stage 3 (moderate): Secondary | ICD-10-CM | POA: Diagnosis not present

## 2019-02-23 DIAGNOSIS — E1122 Type 2 diabetes mellitus with diabetic chronic kidney disease: Secondary | ICD-10-CM | POA: Diagnosis not present

## 2019-02-23 DIAGNOSIS — N2581 Secondary hyperparathyroidism of renal origin: Secondary | ICD-10-CM | POA: Diagnosis not present

## 2019-02-23 DIAGNOSIS — D631 Anemia in chronic kidney disease: Secondary | ICD-10-CM | POA: Diagnosis not present

## 2019-02-23 DIAGNOSIS — I129 Hypertensive chronic kidney disease with stage 1 through stage 4 chronic kidney disease, or unspecified chronic kidney disease: Secondary | ICD-10-CM | POA: Diagnosis not present

## 2019-02-25 ENCOUNTER — Ambulatory Visit (HOSPITAL_COMMUNITY)
Admission: RE | Admit: 2019-02-25 | Discharge: 2019-02-25 | Disposition: A | Discharge status: Home / Self Care | Payer: Medicare Other | Source: Ambulatory Visit | Attending: Internal Medicine | Admitting: Internal Medicine

## 2019-02-25 ENCOUNTER — Other Ambulatory Visit: Payer: Self-pay

## 2019-02-25 DIAGNOSIS — L97529 Non-pressure chronic ulcer of other part of left foot with unspecified severity: Secondary | ICD-10-CM | POA: Insufficient documentation

## 2019-02-25 DIAGNOSIS — S91111D Laceration without foreign body of right great toe without damage to nail, subsequent encounter: Secondary | ICD-10-CM | POA: Diagnosis not present

## 2019-02-25 DIAGNOSIS — E039 Hypothyroidism, unspecified: Secondary | ICD-10-CM | POA: Diagnosis not present

## 2019-02-25 DIAGNOSIS — L97421 Non-pressure chronic ulcer of left heel and midfoot limited to breakdown of skin: Secondary | ICD-10-CM | POA: Diagnosis not present

## 2019-02-25 DIAGNOSIS — E1142 Type 2 diabetes mellitus with diabetic polyneuropathy: Secondary | ICD-10-CM | POA: Diagnosis not present

## 2019-02-25 DIAGNOSIS — L97521 Non-pressure chronic ulcer of other part of left foot limited to breakdown of skin: Secondary | ICD-10-CM | POA: Diagnosis not present

## 2019-02-25 DIAGNOSIS — E13621 Other specified diabetes mellitus with foot ulcer: Secondary | ICD-10-CM | POA: Insufficient documentation

## 2019-02-25 DIAGNOSIS — S91114D Laceration without foreign body of right lesser toe(s) without damage to nail, subsequent encounter: Secondary | ICD-10-CM | POA: Diagnosis not present

## 2019-02-25 DIAGNOSIS — E11621 Type 2 diabetes mellitus with foot ulcer: Secondary | ICD-10-CM | POA: Diagnosis not present

## 2019-02-25 DIAGNOSIS — I10 Essential (primary) hypertension: Secondary | ICD-10-CM | POA: Diagnosis not present

## 2019-02-25 LAB — POCT I-STAT CREATININE: Creatinine, Ser: 1.4 mg/dL — ABNORMAL HIGH (ref 0.44–1.00)

## 2019-02-25 MED ORDER — GADOBUTROL 1 MMOL/ML IV SOLN
5.0000 mL | Freq: Once | INTRAVENOUS | Status: AC | PRN
  Administered 2019-02-25: 5 mL via INTRAVENOUS

## 2019-02-26 ENCOUNTER — Ambulatory Visit (HOSPITAL_COMMUNITY)
Admission: RE | Admit: 2019-02-26 | Discharge: 2019-02-26 | Disposition: A | Discharge status: Home / Self Care | Payer: Medicare Other | Source: Ambulatory Visit | Attending: Nephrology | Admitting: Nephrology

## 2019-02-26 VITALS — BP 119/66 | HR 75 | Temp 97.2°F | Resp 20

## 2019-02-26 DIAGNOSIS — D508 Other iron deficiency anemias: Secondary | ICD-10-CM

## 2019-02-26 DIAGNOSIS — D631 Anemia in chronic kidney disease: Secondary | ICD-10-CM | POA: Diagnosis not present

## 2019-02-26 DIAGNOSIS — N184 Chronic kidney disease, stage 4 (severe): Secondary | ICD-10-CM | POA: Diagnosis not present

## 2019-02-26 LAB — POCT HEMOGLOBIN-HEMACUE: Hemoglobin: 11.2 g/dL — ABNORMAL LOW (ref 12.0–15.0)

## 2019-02-26 MED ORDER — EPOETIN ALFA-EPBX 10000 UNIT/ML IJ SOLN
40000.0000 [IU] | INTRAMUSCULAR | Status: DC
  Administered 2019-02-26: 10:00:00 40000 [IU] via SUBCUTANEOUS
  Filled 2019-02-26: qty 4

## 2019-03-01 DIAGNOSIS — E11621 Type 2 diabetes mellitus with foot ulcer: Secondary | ICD-10-CM | POA: Diagnosis not present

## 2019-03-02 DIAGNOSIS — E11621 Type 2 diabetes mellitus with foot ulcer: Secondary | ICD-10-CM | POA: Diagnosis not present

## 2019-03-02 DIAGNOSIS — L97512 Non-pressure chronic ulcer of other part of right foot with fat layer exposed: Secondary | ICD-10-CM | POA: Diagnosis not present

## 2019-03-02 DIAGNOSIS — L97522 Non-pressure chronic ulcer of other part of left foot with fat layer exposed: Secondary | ICD-10-CM | POA: Diagnosis not present

## 2019-03-02 DIAGNOSIS — I10 Essential (primary) hypertension: Secondary | ICD-10-CM | POA: Diagnosis not present

## 2019-03-02 DIAGNOSIS — E1142 Type 2 diabetes mellitus with diabetic polyneuropathy: Secondary | ICD-10-CM | POA: Diagnosis not present

## 2019-03-02 DIAGNOSIS — M069 Rheumatoid arthritis, unspecified: Secondary | ICD-10-CM | POA: Diagnosis not present

## 2019-03-04 DIAGNOSIS — E039 Hypothyroidism, unspecified: Secondary | ICD-10-CM | POA: Diagnosis not present

## 2019-03-04 DIAGNOSIS — I10 Essential (primary) hypertension: Secondary | ICD-10-CM | POA: Diagnosis not present

## 2019-03-04 DIAGNOSIS — S91114D Laceration without foreign body of right lesser toe(s) without damage to nail, subsequent encounter: Secondary | ICD-10-CM | POA: Diagnosis not present

## 2019-03-04 DIAGNOSIS — S91111D Laceration without foreign body of right great toe without damage to nail, subsequent encounter: Secondary | ICD-10-CM | POA: Diagnosis not present

## 2019-03-04 DIAGNOSIS — E11621 Type 2 diabetes mellitus with foot ulcer: Secondary | ICD-10-CM | POA: Diagnosis not present

## 2019-03-04 DIAGNOSIS — E1142 Type 2 diabetes mellitus with diabetic polyneuropathy: Secondary | ICD-10-CM | POA: Diagnosis not present

## 2019-03-04 DIAGNOSIS — L97421 Non-pressure chronic ulcer of left heel and midfoot limited to breakdown of skin: Secondary | ICD-10-CM | POA: Diagnosis not present

## 2019-03-08 DIAGNOSIS — L97522 Non-pressure chronic ulcer of other part of left foot with fat layer exposed: Secondary | ICD-10-CM | POA: Diagnosis not present

## 2019-03-08 DIAGNOSIS — M069 Rheumatoid arthritis, unspecified: Secondary | ICD-10-CM | POA: Diagnosis not present

## 2019-03-08 DIAGNOSIS — L97512 Non-pressure chronic ulcer of other part of right foot with fat layer exposed: Secondary | ICD-10-CM | POA: Diagnosis not present

## 2019-03-08 DIAGNOSIS — E1142 Type 2 diabetes mellitus with diabetic polyneuropathy: Secondary | ICD-10-CM | POA: Diagnosis not present

## 2019-03-08 DIAGNOSIS — I10 Essential (primary) hypertension: Secondary | ICD-10-CM | POA: Diagnosis not present

## 2019-03-09 DIAGNOSIS — S91111D Laceration without foreign body of right great toe without damage to nail, subsequent encounter: Secondary | ICD-10-CM | POA: Diagnosis not present

## 2019-03-09 DIAGNOSIS — E11621 Type 2 diabetes mellitus with foot ulcer: Secondary | ICD-10-CM | POA: Diagnosis not present

## 2019-03-09 DIAGNOSIS — I10 Essential (primary) hypertension: Secondary | ICD-10-CM | POA: Diagnosis not present

## 2019-03-09 DIAGNOSIS — E039 Hypothyroidism, unspecified: Secondary | ICD-10-CM | POA: Diagnosis not present

## 2019-03-09 DIAGNOSIS — S91114D Laceration without foreign body of right lesser toe(s) without damage to nail, subsequent encounter: Secondary | ICD-10-CM | POA: Diagnosis not present

## 2019-03-09 DIAGNOSIS — L97421 Non-pressure chronic ulcer of left heel and midfoot limited to breakdown of skin: Secondary | ICD-10-CM | POA: Diagnosis not present

## 2019-03-09 DIAGNOSIS — E1142 Type 2 diabetes mellitus with diabetic polyneuropathy: Secondary | ICD-10-CM | POA: Diagnosis not present

## 2019-03-11 DIAGNOSIS — E11621 Type 2 diabetes mellitus with foot ulcer: Secondary | ICD-10-CM | POA: Diagnosis not present

## 2019-03-11 DIAGNOSIS — I10 Essential (primary) hypertension: Secondary | ICD-10-CM | POA: Diagnosis not present

## 2019-03-11 DIAGNOSIS — E039 Hypothyroidism, unspecified: Secondary | ICD-10-CM | POA: Diagnosis not present

## 2019-03-11 DIAGNOSIS — L97421 Non-pressure chronic ulcer of left heel and midfoot limited to breakdown of skin: Secondary | ICD-10-CM | POA: Diagnosis not present

## 2019-03-11 DIAGNOSIS — E1142 Type 2 diabetes mellitus with diabetic polyneuropathy: Secondary | ICD-10-CM | POA: Diagnosis not present

## 2019-03-12 ENCOUNTER — Other Ambulatory Visit: Payer: Self-pay

## 2019-03-12 ENCOUNTER — Encounter (HOSPITAL_COMMUNITY)
Admission: RE | Admit: 2019-03-12 | Discharge: 2019-03-12 | Disposition: A | Discharge status: Home / Self Care | Payer: Medicare Other | Source: Ambulatory Visit | Attending: Nephrology | Admitting: Nephrology

## 2019-03-12 VITALS — BP 105/61 | HR 70 | Temp 97.7°F | Resp 20

## 2019-03-12 DIAGNOSIS — D508 Other iron deficiency anemias: Secondary | ICD-10-CM | POA: Insufficient documentation

## 2019-03-12 LAB — IRON AND TIBC
Iron: 31 ug/dL (ref 28–170)
Saturation Ratios: 21 % (ref 10.4–31.8)
TIBC: 144 ug/dL — ABNORMAL LOW (ref 250–450)
UIBC: 113 ug/dL

## 2019-03-12 LAB — POCT HEMOGLOBIN-HEMACUE: Hemoglobin: 12.1 g/dL (ref 12.0–15.0)

## 2019-03-12 LAB — FERRITIN: Ferritin: 775 ng/mL — ABNORMAL HIGH (ref 11–307)

## 2019-03-12 MED ORDER — EPOETIN ALFA-EPBX 40000 UNIT/ML IJ SOLN
40000.0000 [IU] | INTRAMUSCULAR | Status: DC
  Filled 2019-03-12: qty 1

## 2019-03-13 DIAGNOSIS — I10 Essential (primary) hypertension: Secondary | ICD-10-CM | POA: Diagnosis not present

## 2019-03-13 DIAGNOSIS — E1142 Type 2 diabetes mellitus with diabetic polyneuropathy: Secondary | ICD-10-CM | POA: Diagnosis not present

## 2019-03-13 DIAGNOSIS — L97421 Non-pressure chronic ulcer of left heel and midfoot limited to breakdown of skin: Secondary | ICD-10-CM | POA: Diagnosis not present

## 2019-03-13 DIAGNOSIS — E11621 Type 2 diabetes mellitus with foot ulcer: Secondary | ICD-10-CM | POA: Diagnosis not present

## 2019-03-13 DIAGNOSIS — E039 Hypothyroidism, unspecified: Secondary | ICD-10-CM | POA: Diagnosis not present

## 2019-03-15 DIAGNOSIS — L97522 Non-pressure chronic ulcer of other part of left foot with fat layer exposed: Secondary | ICD-10-CM | POA: Diagnosis not present

## 2019-03-15 DIAGNOSIS — L97512 Non-pressure chronic ulcer of other part of right foot with fat layer exposed: Secondary | ICD-10-CM | POA: Diagnosis not present

## 2019-03-15 DIAGNOSIS — I10 Essential (primary) hypertension: Secondary | ICD-10-CM | POA: Diagnosis not present

## 2019-03-15 DIAGNOSIS — E1142 Type 2 diabetes mellitus with diabetic polyneuropathy: Secondary | ICD-10-CM | POA: Diagnosis not present

## 2019-03-15 DIAGNOSIS — M069 Rheumatoid arthritis, unspecified: Secondary | ICD-10-CM | POA: Diagnosis not present

## 2019-03-16 ENCOUNTER — Ambulatory Visit (INDEPENDENT_AMBULATORY_CARE_PROVIDER_SITE_OTHER): Payer: Medicare Other | Admitting: Family Medicine

## 2019-03-16 ENCOUNTER — Other Ambulatory Visit: Payer: Self-pay

## 2019-03-16 VITALS — BP 130/80 | HR 73

## 2019-03-16 DIAGNOSIS — H539 Unspecified visual disturbance: Secondary | ICD-10-CM

## 2019-03-16 DIAGNOSIS — E1165 Type 2 diabetes mellitus with hyperglycemia: Secondary | ICD-10-CM | POA: Diagnosis not present

## 2019-03-16 LAB — POCT GLYCOSYLATED HEMOGLOBIN (HGB A1C): HbA1c, POC (controlled diabetic range): 6.4 % (ref 0.0–7.0)

## 2019-03-16 MED ORDER — ROSUVASTATIN CALCIUM 20 MG PO TABS: 20.0000 mg | ORAL_TABLET | Freq: Every day | ORAL | 3 refills | Status: DC

## 2019-03-16 MED ORDER — ONETOUCH VERIO W/DEVICE KIT: PACK | 0 refills | Status: DC

## 2019-03-16 NOTE — Patient Instructions (Signed)
Sent in new meter Refilled cholesterol medication  Get cholesterol drawn at lab appointment at [facility]  Stop glipizide  Follow up in 3 months  Be well, Dr. [name]

## 2019-03-18 DIAGNOSIS — L97421 Non-pressure chronic ulcer of left heel and midfoot limited to breakdown of skin: Secondary | ICD-10-CM | POA: Diagnosis not present

## 2019-03-18 DIAGNOSIS — E039 Hypothyroidism, unspecified: Secondary | ICD-10-CM | POA: Diagnosis not present

## 2019-03-18 DIAGNOSIS — E1142 Type 2 diabetes mellitus with diabetic polyneuropathy: Secondary | ICD-10-CM | POA: Diagnosis not present

## 2019-03-18 DIAGNOSIS — E11621 Type 2 diabetes mellitus with foot ulcer: Secondary | ICD-10-CM | POA: Diagnosis not present

## 2019-03-18 DIAGNOSIS — I10 Essential (primary) hypertension: Secondary | ICD-10-CM | POA: Diagnosis not present

## 2019-03-19 ENCOUNTER — Encounter (HOSPITAL_BASED_OUTPATIENT_CLINIC_OR_DEPARTMENT_OTHER): Payer: Medicare Other | Attending: Family Medicine

## 2019-03-21 NOTE — Progress Notes (Signed)
Date of Visit: 03/16/2019   HPI:  Patient presents for follow up and to discuss clearance for cataract surgery.  Cataract surgery - having upcoming surgery for cataracts. Needs letter from me stating medically ok to proceed with surgery.  Diabetes - currently taking glipizide 2.5mg  about 2-3 times a week. a1c today is 6.4. needs new meter, not checking sugars much at home. When she has checked they were in 140s  Hyperlipidemia - needs refill on rosuvastatin. Takes 20mg  daily.  Has been following up regularly with nephrology. Gets regular iron injections as well. Also following with wound care center for her toe, that is going well.  ROS: See HPI.  [place]: history of type 2 diabetes, hypothyroidism, hypertension, rheumatoid arthritis, ckd   PHYSICAL EXAM: BP 130/80   Pulse 73   SpO2 100%  Gen: no acute distress, pleasant, cooperative HEENT: normocephalic, atraumatic  Heart: regular rate and rhythm, no murmur Lungs: clear to auscultation bilaterally, normal work of breathing  Neuro: alert, grossly nonfocal, speech normal  ASSESSMENT/PLAN:  Type 2 diabetes mellitus (HCC) a1c 6.4 today - excellent control. Taking glipizide sparingly, will stop. Sent in new meter for her to check sugars. Follow up in 3 months for next A1c. Check lipids at upcoming lab visit.  Vision disturbance Having upcoming cataract surgery. Wrote letter, no medical barriers to surgery.  FOLLOW UP: Follow up in 3 mos for above issues  [place] J. [name], [place]

## 2019-03-21 NOTE — Assessment & Plan Note (Addendum)
a1c 6.4 today - excellent control. Taking glipizide sparingly, will stop. Sent in new meter for her to check sugars. Follow up in 3 months for next A1c. Check lipids at upcoming lab visit.

## 2019-03-21 NOTE — Assessment & Plan Note (Signed)
Having upcoming cataract surgery. Wrote letter, no medical barriers to surgery.

## 2019-03-24 ENCOUNTER — Encounter (HOSPITAL_BASED_OUTPATIENT_CLINIC_OR_DEPARTMENT_OTHER): Payer: Medicare Other | Attending: Internal Medicine

## 2019-03-24 DIAGNOSIS — E11621 Type 2 diabetes mellitus with foot ulcer: Secondary | ICD-10-CM | POA: Insufficient documentation

## 2019-03-24 DIAGNOSIS — I10 Essential (primary) hypertension: Secondary | ICD-10-CM | POA: Insufficient documentation

## 2019-03-24 DIAGNOSIS — L97422 Non-pressure chronic ulcer of left heel and midfoot with fat layer exposed: Secondary | ICD-10-CM | POA: Diagnosis not present

## 2019-03-24 DIAGNOSIS — E1142 Type 2 diabetes mellitus with diabetic polyneuropathy: Secondary | ICD-10-CM | POA: Insufficient documentation

## 2019-03-24 DIAGNOSIS — L97522 Non-pressure chronic ulcer of other part of left foot with fat layer exposed: Secondary | ICD-10-CM | POA: Diagnosis not present

## 2019-03-25 ENCOUNTER — Other Ambulatory Visit (HOSPITAL_COMMUNITY): Payer: Self-pay | Admitting: *Deleted

## 2019-03-25 DIAGNOSIS — E1142 Type 2 diabetes mellitus with diabetic polyneuropathy: Secondary | ICD-10-CM | POA: Diagnosis not present

## 2019-03-25 DIAGNOSIS — L97421 Non-pressure chronic ulcer of left heel and midfoot limited to breakdown of skin: Secondary | ICD-10-CM | POA: Diagnosis not present

## 2019-03-25 DIAGNOSIS — E039 Hypothyroidism, unspecified: Secondary | ICD-10-CM | POA: Diagnosis not present

## 2019-03-25 DIAGNOSIS — E11621 Type 2 diabetes mellitus with foot ulcer: Secondary | ICD-10-CM | POA: Diagnosis not present

## 2019-03-25 DIAGNOSIS — I10 Essential (primary) hypertension: Secondary | ICD-10-CM | POA: Diagnosis not present

## 2019-03-26 ENCOUNTER — Encounter (HOSPITAL_COMMUNITY)
Admission: RE | Admit: 2019-03-26 | Discharge: 2019-03-26 | Disposition: A | Discharge status: Home / Self Care | Payer: Medicare Other | Source: Ambulatory Visit | Attending: Nephrology | Admitting: Nephrology

## 2019-03-26 ENCOUNTER — Other Ambulatory Visit: Payer: Self-pay

## 2019-03-26 VITALS — BP 109/65 | HR 72 | Temp 97.5°F | Resp 20 | Ht 62.0 in | Wt 120.0 lb

## 2019-03-26 DIAGNOSIS — D631 Anemia in chronic kidney disease: Secondary | ICD-10-CM | POA: Diagnosis not present

## 2019-03-26 DIAGNOSIS — N189 Chronic kidney disease, unspecified: Secondary | ICD-10-CM | POA: Diagnosis not present

## 2019-03-26 DIAGNOSIS — D508 Other iron deficiency anemias: Secondary | ICD-10-CM

## 2019-03-26 LAB — POCT HEMOGLOBIN-HEMACUE: Hemoglobin: 12.4 g/dL (ref 12.0–15.0)

## 2019-03-26 MED ORDER — SODIUM CHLORIDE 0.9 % IV SOLN
300.0000 mg | Freq: Once | INTRAVENOUS | Status: AC
  Administered 2019-03-26: 300 mg via INTRAVENOUS
  Filled 2019-03-26: qty 15

## 2019-03-26 MED ORDER — EPOETIN ALFA-EPBX 10000 UNIT/ML IJ SOLN
40000.0000 [IU] | Freq: Once | INTRAMUSCULAR | Status: DC
  Filled 2019-03-26: qty 4

## 2019-03-26 MED ORDER — EPOETIN ALFA-EPBX 40000 UNIT/ML IJ SOLN: 40000.0000 [IU] | INTRAMUSCULAR | Status: DC

## 2019-04-01 DIAGNOSIS — E11621 Type 2 diabetes mellitus with foot ulcer: Secondary | ICD-10-CM | POA: Diagnosis not present

## 2019-04-01 DIAGNOSIS — E039 Hypothyroidism, unspecified: Secondary | ICD-10-CM | POA: Diagnosis not present

## 2019-04-01 DIAGNOSIS — L97421 Non-pressure chronic ulcer of left heel and midfoot limited to breakdown of skin: Secondary | ICD-10-CM | POA: Diagnosis not present

## 2019-04-01 DIAGNOSIS — E1142 Type 2 diabetes mellitus with diabetic polyneuropathy: Secondary | ICD-10-CM | POA: Diagnosis not present

## 2019-04-01 DIAGNOSIS — I10 Essential (primary) hypertension: Secondary | ICD-10-CM | POA: Diagnosis not present

## 2019-04-05 DIAGNOSIS — L97429 Non-pressure chronic ulcer of left heel and midfoot with unspecified severity: Secondary | ICD-10-CM | POA: Diagnosis not present

## 2019-04-05 DIAGNOSIS — L97422 Non-pressure chronic ulcer of left heel and midfoot with fat layer exposed: Secondary | ICD-10-CM | POA: Diagnosis not present

## 2019-04-05 DIAGNOSIS — E11621 Type 2 diabetes mellitus with foot ulcer: Secondary | ICD-10-CM | POA: Diagnosis not present

## 2019-04-05 DIAGNOSIS — E1142 Type 2 diabetes mellitus with diabetic polyneuropathy: Secondary | ICD-10-CM | POA: Diagnosis not present

## 2019-04-05 DIAGNOSIS — I10 Essential (primary) hypertension: Secondary | ICD-10-CM | POA: Diagnosis not present

## 2019-04-08 DIAGNOSIS — I10 Essential (primary) hypertension: Secondary | ICD-10-CM | POA: Diagnosis not present

## 2019-04-08 DIAGNOSIS — L97421 Non-pressure chronic ulcer of left heel and midfoot limited to breakdown of skin: Secondary | ICD-10-CM | POA: Diagnosis not present

## 2019-04-08 DIAGNOSIS — E039 Hypothyroidism, unspecified: Secondary | ICD-10-CM | POA: Diagnosis not present

## 2019-04-08 DIAGNOSIS — E1142 Type 2 diabetes mellitus with diabetic polyneuropathy: Secondary | ICD-10-CM | POA: Diagnosis not present

## 2019-04-08 DIAGNOSIS — E11621 Type 2 diabetes mellitus with foot ulcer: Secondary | ICD-10-CM | POA: Diagnosis not present

## 2019-04-09 ENCOUNTER — Encounter (HOSPITAL_COMMUNITY)
Admission: RE | Admit: 2019-04-09 | Discharge: 2019-04-09 | Disposition: A | Discharge status: Home / Self Care | Payer: Medicare Other | Source: Ambulatory Visit | Attending: Nephrology | Admitting: Nephrology

## 2019-04-09 ENCOUNTER — Other Ambulatory Visit: Payer: Self-pay

## 2019-04-09 VITALS — BP 132/68 | HR 74 | Temp 97.2°F | Resp 20

## 2019-04-09 DIAGNOSIS — D631 Anemia in chronic kidney disease: Secondary | ICD-10-CM | POA: Diagnosis not present

## 2019-04-09 DIAGNOSIS — D508 Other iron deficiency anemias: Secondary | ICD-10-CM

## 2019-04-09 DIAGNOSIS — N189 Chronic kidney disease, unspecified: Secondary | ICD-10-CM | POA: Diagnosis not present

## 2019-04-09 LAB — IRON AND TIBC
Iron: 28 ug/dL (ref 28–170)
Saturation Ratios: 21 % (ref 10.4–31.8)
TIBC: 134 ug/dL — ABNORMAL LOW (ref 250–450)
UIBC: 106 ug/dL

## 2019-04-09 LAB — POCT HEMOGLOBIN-HEMACUE: Hemoglobin: 10.4 g/dL — ABNORMAL LOW (ref 12.0–15.0)

## 2019-04-09 LAB — FERRITIN: Ferritin: 1085 ng/mL — ABNORMAL HIGH (ref 11–307)

## 2019-04-09 MED ORDER — EPOETIN ALFA-EPBX 40000 UNIT/ML IJ SOLN
40000.0000 [IU] | INTRAMUSCULAR | Status: DC
  Administered 2019-04-09: 40000 [IU] via SUBCUTANEOUS
  Filled 2019-04-09: qty 1

## 2019-04-12 DIAGNOSIS — I10 Essential (primary) hypertension: Secondary | ICD-10-CM | POA: Diagnosis not present

## 2019-04-12 DIAGNOSIS — L97429 Non-pressure chronic ulcer of left heel and midfoot with unspecified severity: Secondary | ICD-10-CM | POA: Diagnosis not present

## 2019-04-12 DIAGNOSIS — L97422 Non-pressure chronic ulcer of left heel and midfoot with fat layer exposed: Secondary | ICD-10-CM | POA: Diagnosis not present

## 2019-04-12 DIAGNOSIS — E1142 Type 2 diabetes mellitus with diabetic polyneuropathy: Secondary | ICD-10-CM | POA: Diagnosis not present

## 2019-04-12 DIAGNOSIS — M2142 Flat foot [pes planus] (acquired), left foot: Secondary | ICD-10-CM | POA: Diagnosis not present

## 2019-04-12 DIAGNOSIS — E11621 Type 2 diabetes mellitus with foot ulcer: Secondary | ICD-10-CM | POA: Diagnosis not present

## 2019-04-15 DIAGNOSIS — I10 Essential (primary) hypertension: Secondary | ICD-10-CM | POA: Diagnosis not present

## 2019-04-15 DIAGNOSIS — E039 Hypothyroidism, unspecified: Secondary | ICD-10-CM | POA: Diagnosis not present

## 2019-04-15 DIAGNOSIS — E11621 Type 2 diabetes mellitus with foot ulcer: Secondary | ICD-10-CM | POA: Diagnosis not present

## 2019-04-15 DIAGNOSIS — L97421 Non-pressure chronic ulcer of left heel and midfoot limited to breakdown of skin: Secondary | ICD-10-CM | POA: Diagnosis not present

## 2019-04-15 DIAGNOSIS — E1142 Type 2 diabetes mellitus with diabetic polyneuropathy: Secondary | ICD-10-CM | POA: Diagnosis not present

## 2019-04-16 ENCOUNTER — Encounter: Payer: Self-pay | Admitting: Podiatry

## 2019-04-16 ENCOUNTER — Ambulatory Visit (INDEPENDENT_AMBULATORY_CARE_PROVIDER_SITE_OTHER): Payer: Medicare Other | Admitting: Podiatry

## 2019-04-16 ENCOUNTER — Other Ambulatory Visit: Payer: Self-pay

## 2019-04-16 DIAGNOSIS — B351 Tinea unguium: Secondary | ICD-10-CM | POA: Diagnosis not present

## 2019-04-16 DIAGNOSIS — E1159 Type 2 diabetes mellitus with other circulatory complications: Secondary | ICD-10-CM

## 2019-04-16 DIAGNOSIS — M79676 Pain in unspecified toe(s): Secondary | ICD-10-CM

## 2019-04-16 DIAGNOSIS — M79609 Pain in unspecified limb: Secondary | ICD-10-CM

## 2019-04-16 NOTE — Progress Notes (Signed)
Patient ID: [name], female   DOB: 1945/11/29, 73 y.o.   MRN: [id number] Complaint:  Visit Type: Patient presents  to my office for  preventative foot care services. Complaint: Patient states" my nails have grown long and thick and become painful to walk and wear shoes" Patient has been diagnosed with DM with neuropathy.   No evidence of blisters/ulcers.  Patient has been hospitalized for kidney disease and is presently wearing leg/foot bandages  B/L.  Patient also says she almost lost her great toe right foot.  This patient presents for preventative foot care services.  Podiatric Exam: Vascular: dorsalis pedis and posterior tibial pulses are not  palpable bilateral due to swollen feet/legs. Capillary return is immediate. Temperature gradient is WNL.   Sensorium: Normal Semmes Weinstein monofilament test. Normal tactile sensation bilaterally. Nail Exam: Pt has thick disfigured discolored nails with subungual debris noted bilateral entire nail hallux through second toes both feet.; Ulcer Exam: There is no evidence of ulcer or pre-ulcerative changes or infection. Orthopedic Exam: Deferred due to bandages both feet. Skin: No Porokeratosis. No infection or ulcers,      Diagnosis:  Onychomycosis, , Pain in right toe, pain in left toes,  Diabetes with angiopathy and neurology.  Treatment & Plan Procedures and Treatment: Consent by patient was obtained for treatment procedures. The patient understood the discussion of treatment and procedures well. All questions were answered thoroughly reviewed. Debridement of mycotic and hypertrophic toenails, 1 through 5 bilateral and clearing of subungual debris. No ulceration, no infection noted.  Told her we will check for diabetic shoes next visit.    Return Visit-Office Procedure: Patient instructed to return to the office for a follow up visit 3 months.   Gardiner Barefoot DPM

## 2019-04-22 DIAGNOSIS — L97421 Non-pressure chronic ulcer of left heel and midfoot limited to breakdown of skin: Secondary | ICD-10-CM | POA: Diagnosis not present

## 2019-04-22 DIAGNOSIS — E11621 Type 2 diabetes mellitus with foot ulcer: Secondary | ICD-10-CM | POA: Diagnosis not present

## 2019-04-22 DIAGNOSIS — E039 Hypothyroidism, unspecified: Secondary | ICD-10-CM | POA: Diagnosis not present

## 2019-04-22 DIAGNOSIS — I10 Essential (primary) hypertension: Secondary | ICD-10-CM | POA: Diagnosis not present

## 2019-04-22 DIAGNOSIS — E1142 Type 2 diabetes mellitus with diabetic polyneuropathy: Secondary | ICD-10-CM | POA: Diagnosis not present

## 2019-04-23 ENCOUNTER — Other Ambulatory Visit: Payer: Self-pay

## 2019-04-23 ENCOUNTER — Ambulatory Visit (HOSPITAL_COMMUNITY)
Admission: RE | Admit: 2019-04-23 | Discharge: 2019-04-23 | Disposition: A | Discharge status: Home / Self Care | Payer: Medicare Other | Source: Ambulatory Visit | Attending: Nephrology | Admitting: Nephrology

## 2019-04-23 VITALS — BP 126/68 | HR 78 | Temp 97.2°F | Resp 20

## 2019-04-23 DIAGNOSIS — N189 Chronic kidney disease, unspecified: Secondary | ICD-10-CM | POA: Diagnosis not present

## 2019-04-23 DIAGNOSIS — D508 Other iron deficiency anemias: Secondary | ICD-10-CM

## 2019-04-23 DIAGNOSIS — D631 Anemia in chronic kidney disease: Secondary | ICD-10-CM | POA: Diagnosis not present

## 2019-04-23 MED ORDER — EPOETIN ALFA-EPBX 40000 UNIT/ML IJ SOLN
40000.0000 [IU] | INTRAMUSCULAR | Status: DC
  Administered 2019-04-23: 40000 [IU] via SUBCUTANEOUS
  Filled 2019-04-23: qty 1

## 2019-04-27 ENCOUNTER — Other Ambulatory Visit: Payer: Self-pay

## 2019-04-27 ENCOUNTER — Encounter (HOSPITAL_BASED_OUTPATIENT_CLINIC_OR_DEPARTMENT_OTHER): Payer: Medicare Other | Attending: Internal Medicine

## 2019-04-27 DIAGNOSIS — E1142 Type 2 diabetes mellitus with diabetic polyneuropathy: Secondary | ICD-10-CM | POA: Diagnosis not present

## 2019-04-27 DIAGNOSIS — E11610 Type 2 diabetes mellitus with diabetic neuropathic arthropathy: Secondary | ICD-10-CM | POA: Insufficient documentation

## 2019-04-27 DIAGNOSIS — S80812A Abrasion, left lower leg, initial encounter: Secondary | ICD-10-CM | POA: Diagnosis not present

## 2019-04-27 DIAGNOSIS — I10 Essential (primary) hypertension: Secondary | ICD-10-CM | POA: Diagnosis not present

## 2019-04-27 DIAGNOSIS — X58XXXA Exposure to other specified factors, initial encounter: Secondary | ICD-10-CM | POA: Diagnosis not present

## 2019-04-27 DIAGNOSIS — L97422 Non-pressure chronic ulcer of left heel and midfoot with fat layer exposed: Secondary | ICD-10-CM | POA: Diagnosis not present

## 2019-04-27 DIAGNOSIS — E11621 Type 2 diabetes mellitus with foot ulcer: Secondary | ICD-10-CM | POA: Diagnosis not present

## 2019-04-27 LAB — POCT HEMOGLOBIN-HEMACUE: Hemoglobin: 9.9 g/dL — ABNORMAL LOW (ref 12.0–15.0)

## 2019-04-29 DIAGNOSIS — E1142 Type 2 diabetes mellitus with diabetic polyneuropathy: Secondary | ICD-10-CM | POA: Diagnosis not present

## 2019-04-29 DIAGNOSIS — L97421 Non-pressure chronic ulcer of left heel and midfoot limited to breakdown of skin: Secondary | ICD-10-CM | POA: Diagnosis not present

## 2019-04-29 DIAGNOSIS — E039 Hypothyroidism, unspecified: Secondary | ICD-10-CM | POA: Diagnosis not present

## 2019-04-29 DIAGNOSIS — I10 Essential (primary) hypertension: Secondary | ICD-10-CM | POA: Diagnosis not present

## 2019-04-29 DIAGNOSIS — E11621 Type 2 diabetes mellitus with foot ulcer: Secondary | ICD-10-CM | POA: Diagnosis not present

## 2019-05-03 DIAGNOSIS — E1142 Type 2 diabetes mellitus with diabetic polyneuropathy: Secondary | ICD-10-CM | POA: Diagnosis not present

## 2019-05-03 DIAGNOSIS — S80812A Abrasion, left lower leg, initial encounter: Secondary | ICD-10-CM | POA: Diagnosis not present

## 2019-05-03 DIAGNOSIS — E11610 Type 2 diabetes mellitus with diabetic neuropathic arthropathy: Secondary | ICD-10-CM | POA: Diagnosis not present

## 2019-05-03 DIAGNOSIS — E11621 Type 2 diabetes mellitus with foot ulcer: Secondary | ICD-10-CM | POA: Diagnosis not present

## 2019-05-03 DIAGNOSIS — I10 Essential (primary) hypertension: Secondary | ICD-10-CM | POA: Diagnosis not present

## 2019-05-03 DIAGNOSIS — L97522 Non-pressure chronic ulcer of other part of left foot with fat layer exposed: Secondary | ICD-10-CM | POA: Diagnosis not present

## 2019-05-03 DIAGNOSIS — L97422 Non-pressure chronic ulcer of left heel and midfoot with fat layer exposed: Secondary | ICD-10-CM | POA: Diagnosis not present

## 2019-05-04 DIAGNOSIS — H2511 Age-related nuclear cataract, right eye: Secondary | ICD-10-CM | POA: Diagnosis not present

## 2019-05-04 DIAGNOSIS — E113553 Type 2 diabetes mellitus with stable proliferative diabetic retinopathy, bilateral: Secondary | ICD-10-CM | POA: Diagnosis not present

## 2019-05-04 DIAGNOSIS — H35043 Retinal micro-aneurysms, unspecified, bilateral: Secondary | ICD-10-CM | POA: Diagnosis not present

## 2019-05-04 DIAGNOSIS — H25041 Posterior subcapsular polar age-related cataract, right eye: Secondary | ICD-10-CM | POA: Diagnosis not present

## 2019-05-06 ENCOUNTER — Other Ambulatory Visit: Payer: Self-pay | Admitting: Family Medicine

## 2019-05-06 DIAGNOSIS — L97421 Non-pressure chronic ulcer of left heel and midfoot limited to breakdown of skin: Secondary | ICD-10-CM | POA: Diagnosis not present

## 2019-05-06 DIAGNOSIS — I10 Essential (primary) hypertension: Secondary | ICD-10-CM | POA: Diagnosis not present

## 2019-05-06 DIAGNOSIS — E11621 Type 2 diabetes mellitus with foot ulcer: Secondary | ICD-10-CM | POA: Diagnosis not present

## 2019-05-06 DIAGNOSIS — E1142 Type 2 diabetes mellitus with diabetic polyneuropathy: Secondary | ICD-10-CM | POA: Diagnosis not present

## 2019-05-06 DIAGNOSIS — E039 Hypothyroidism, unspecified: Secondary | ICD-10-CM | POA: Diagnosis not present

## 2019-05-07 ENCOUNTER — Other Ambulatory Visit: Payer: Self-pay

## 2019-05-07 ENCOUNTER — Ambulatory Visit (HOSPITAL_COMMUNITY)
Admission: RE | Admit: 2019-05-07 | Discharge: 2019-05-07 | Disposition: A | Discharge status: Home / Self Care | Payer: Medicare Other | Source: Ambulatory Visit | Attending: Nephrology | Admitting: Nephrology

## 2019-05-07 VITALS — BP 160/77 | HR 72 | Temp 97.2°F | Resp 20 | Ht 62.0 in | Wt 130.0 lb

## 2019-05-07 DIAGNOSIS — D631 Anemia in chronic kidney disease: Secondary | ICD-10-CM | POA: Diagnosis not present

## 2019-05-07 DIAGNOSIS — N183 Chronic kidney disease, stage 3 (moderate): Secondary | ICD-10-CM | POA: Insufficient documentation

## 2019-05-07 DIAGNOSIS — D508 Other iron deficiency anemias: Secondary | ICD-10-CM

## 2019-05-07 LAB — POCT HEMOGLOBIN-HEMACUE: Hemoglobin: 10.5 g/dL — ABNORMAL LOW (ref 12.0–15.0)

## 2019-05-07 MED ORDER — EPOETIN ALFA-EPBX 40000 UNIT/ML IJ SOLN
40000.0000 [IU] | INTRAMUSCULAR | Status: DC
  Administered 2019-05-07: 11:00:00 40000 [IU] via SUBCUTANEOUS
  Filled 2019-05-07: qty 1

## 2019-05-07 MED ORDER — SODIUM CHLORIDE 0.9 % IV SOLN
300.0000 mg | Freq: Once | INTRAVENOUS | Status: AC
  Administered 2019-05-07: 11:00:00 300 mg via INTRAVENOUS
  Filled 2019-05-07: qty 15

## 2019-05-17 DIAGNOSIS — I10 Essential (primary) hypertension: Secondary | ICD-10-CM | POA: Diagnosis not present

## 2019-05-17 DIAGNOSIS — E11610 Type 2 diabetes mellitus with diabetic neuropathic arthropathy: Secondary | ICD-10-CM | POA: Diagnosis not present

## 2019-05-17 DIAGNOSIS — E11621 Type 2 diabetes mellitus with foot ulcer: Secondary | ICD-10-CM | POA: Diagnosis not present

## 2019-05-17 DIAGNOSIS — E1142 Type 2 diabetes mellitus with diabetic polyneuropathy: Secondary | ICD-10-CM | POA: Diagnosis not present

## 2019-05-17 DIAGNOSIS — S80812A Abrasion, left lower leg, initial encounter: Secondary | ICD-10-CM | POA: Diagnosis not present

## 2019-05-17 DIAGNOSIS — M7989 Other specified soft tissue disorders: Secondary | ICD-10-CM | POA: Diagnosis not present

## 2019-05-17 DIAGNOSIS — S81802A Unspecified open wound, left lower leg, initial encounter: Secondary | ICD-10-CM | POA: Diagnosis not present

## 2019-05-17 DIAGNOSIS — M79662 Pain in left lower leg: Secondary | ICD-10-CM | POA: Diagnosis not present

## 2019-05-17 DIAGNOSIS — L97422 Non-pressure chronic ulcer of left heel and midfoot with fat layer exposed: Secondary | ICD-10-CM | POA: Diagnosis not present

## 2019-05-18 ENCOUNTER — Other Ambulatory Visit: Payer: Self-pay | Admitting: Internal Medicine

## 2019-05-18 DIAGNOSIS — M7989 Other specified soft tissue disorders: Secondary | ICD-10-CM

## 2019-05-18 DIAGNOSIS — M79662 Pain in left lower leg: Secondary | ICD-10-CM

## 2019-05-18 DIAGNOSIS — L97529 Non-pressure chronic ulcer of other part of left foot with unspecified severity: Secondary | ICD-10-CM | POA: Diagnosis not present

## 2019-05-18 DIAGNOSIS — I87312 Chronic venous hypertension (idiopathic) with ulcer of left lower extremity: Secondary | ICD-10-CM | POA: Diagnosis not present

## 2019-05-19 ENCOUNTER — Telehealth: Payer: Self-pay | Admitting: *Deleted

## 2019-05-19 DIAGNOSIS — H25041 Posterior subcapsular polar age-related cataract, right eye: Secondary | ICD-10-CM | POA: Diagnosis not present

## 2019-05-19 DIAGNOSIS — H2511 Age-related nuclear cataract, right eye: Secondary | ICD-10-CM | POA: Diagnosis not present

## 2019-05-19 DIAGNOSIS — H25011 Cortical age-related cataract, right eye: Secondary | ICD-10-CM | POA: Diagnosis not present

## 2019-05-19 DIAGNOSIS — H1851 Endothelial corneal dystrophy: Secondary | ICD-10-CM | POA: Diagnosis not present

## 2019-05-19 NOTE — Telephone Encounter (Signed)
[name] is calling from Dr Claudia Desanctis office.  Given pt recent non-healing foot ulcer and possbility of blood clots they will need a new verbal ok from Dr. [name] before pts surgery on 06/17/19. Christen Bame, CMA

## 2019-05-20 ENCOUNTER — Other Ambulatory Visit: Payer: Medicare Other

## 2019-05-20 NOTE — Telephone Encounter (Signed)
I don't know anything about patient possibly having blood clots. Please ask patient to schedule a visit (virtual or in person) to discuss further. If I don't have openings she can see any provider to discuss this. Thanks [name], MD

## 2019-05-21 ENCOUNTER — Other Ambulatory Visit: Payer: Self-pay

## 2019-05-21 ENCOUNTER — Ambulatory Visit (HOSPITAL_COMMUNITY)
Admission: RE | Admit: 2019-05-21 | Discharge: 2019-05-21 | Disposition: A | Discharge status: Home / Self Care | Payer: Medicare Other | Source: Ambulatory Visit | Attending: Nephrology | Admitting: Nephrology

## 2019-05-21 VITALS — BP 148/84 | HR 73 | Temp 97.3°F | Resp 20

## 2019-05-21 DIAGNOSIS — E11621 Type 2 diabetes mellitus with foot ulcer: Secondary | ICD-10-CM | POA: Diagnosis not present

## 2019-05-21 DIAGNOSIS — N183 Chronic kidney disease, stage 3 unspecified: Secondary | ICD-10-CM | POA: Diagnosis not present

## 2019-05-21 DIAGNOSIS — D631 Anemia in chronic kidney disease: Secondary | ICD-10-CM | POA: Insufficient documentation

## 2019-05-21 DIAGNOSIS — E1142 Type 2 diabetes mellitus with diabetic polyneuropathy: Secondary | ICD-10-CM | POA: Diagnosis not present

## 2019-05-21 DIAGNOSIS — L97421 Non-pressure chronic ulcer of left heel and midfoot limited to breakdown of skin: Secondary | ICD-10-CM | POA: Diagnosis not present

## 2019-05-21 DIAGNOSIS — I10 Essential (primary) hypertension: Secondary | ICD-10-CM | POA: Diagnosis not present

## 2019-05-21 DIAGNOSIS — D508 Other iron deficiency anemias: Secondary | ICD-10-CM | POA: Diagnosis not present

## 2019-05-21 DIAGNOSIS — E039 Hypothyroidism, unspecified: Secondary | ICD-10-CM | POA: Diagnosis not present

## 2019-05-21 LAB — POCT HEMOGLOBIN-HEMACUE: Hemoglobin: 11.1 g/dL — ABNORMAL LOW (ref 12.0–15.0)

## 2019-05-21 LAB — FERRITIN: Ferritin: 940 ng/mL — ABNORMAL HIGH (ref 11–307)

## 2019-05-21 LAB — IRON AND TIBC
Iron: 31 ug/dL (ref 28–170)
Saturation Ratios: 21 % (ref 10.4–31.8)
TIBC: 151 ug/dL — ABNORMAL LOW (ref 250–450)
UIBC: 120 ug/dL

## 2019-05-21 MED ORDER — EPOETIN ALFA-EPBX 40000 UNIT/ML IJ SOLN
40000.0000 [IU] | INTRAMUSCULAR | Status: DC
  Administered 2019-05-21: 40000 [IU] via SUBCUTANEOUS
  Filled 2019-05-21: qty 1

## 2019-05-21 NOTE — Telephone Encounter (Signed)
LMOVM for pt to call us back. See message below to schedule an appt or virtual . Delray Alt, CMA

## 2019-05-21 NOTE — Telephone Encounter (Signed)
Scheduled 10/12 for virtual visit with PCP.

## 2019-05-24 ENCOUNTER — Other Ambulatory Visit: Payer: Self-pay

## 2019-05-24 ENCOUNTER — Encounter (HOSPITAL_BASED_OUTPATIENT_CLINIC_OR_DEPARTMENT_OTHER): Payer: Medicare Other | Attending: Internal Medicine | Admitting: Internal Medicine

## 2019-05-24 DIAGNOSIS — L97522 Non-pressure chronic ulcer of other part of left foot with fat layer exposed: Secondary | ICD-10-CM | POA: Insufficient documentation

## 2019-05-24 DIAGNOSIS — E11621 Type 2 diabetes mellitus with foot ulcer: Secondary | ICD-10-CM | POA: Insufficient documentation

## 2019-05-24 DIAGNOSIS — E1142 Type 2 diabetes mellitus with diabetic polyneuropathy: Secondary | ICD-10-CM | POA: Insufficient documentation

## 2019-05-24 DIAGNOSIS — M069 Rheumatoid arthritis, unspecified: Secondary | ICD-10-CM | POA: Insufficient documentation

## 2019-05-24 DIAGNOSIS — I10 Essential (primary) hypertension: Secondary | ICD-10-CM | POA: Insufficient documentation

## 2019-05-25 ENCOUNTER — Other Ambulatory Visit: Payer: Medicare Other

## 2019-05-27 NOTE — Progress Notes (Signed)
[name], [name] ([id number]) Visit Report for 05/24/2019 Debridement Details Patient Name: Date of Service: MAYTAL, MIJANGOS 05/24/2019 10:15 AM Medical Record [id number] Patient Account Number: [id number] Date of Birth/Sex: Treating RN: [birth year] (73 y.o. [name] Primary Care Provider: Chrisandra Netters Other Clinician: Referring Provider: Treating Provider/Extender:Robson, Nonda Lou, Ivan Anchors in Treatment: 46 Debridement Performed for Wound #19 Left,Plantar Foot Assessment: Performed By: Physician Ricard Dillon., MD Debridement Type: Debridement Severity of Tissue Pre Fat layer exposed Debridement: Level of Consciousness (Pre- Awake and Alert procedure): Pre-procedure Verification/Time Out Taken: Yes - 11:33 Start Time: 11:33 Pain Control: Lidocaine 5% topical ointment Total Area Debrided (L x W): 0.6 (cm) x 2.6 (cm) = 1.56 (cm) Tissue and other material Viable, Non-Viable, Callus, Subcutaneous debrided: Level: Skin/Subcutaneous Tissue Debridement Description: Excisional Instrument: Curette Bleeding: None End Time: 11:34 Procedural Pain: 0 Post Procedural Pain: 0 Response to Treatment: Procedure was tolerated well Level of Consciousness Awake and Alert (Post-procedure): Post Debridement Measurements of Total Wound Length: (cm) 0.6 Width: (cm) 2.6 Depth: (cm) 0.1 Volume: (cm) 0.123 Character of Wound/Ulcer Post Improved Debridement: Severity of Tissue Post Debridement: Fat layer exposed Post Procedure Diagnosis Same as Pre-procedure Electronic Signature(s) Signed: 05/24/2019 6:11:18 PM By: Linton Ham MD Signed: 05/24/2019 6:17:12 PM By: Levan Hurst RN, BSN Entered By: Linton Ham on 05/24/2019 12:01:44 -------------------------------------------------------------------------------- HPI Details Patient Name: Date of Service: [name] 05/24/2019 10:15 AM Medical Record [id number] Patient Account Number:  [id number] Date of Birth/Sex: Treating RN: Sep 12, 1945 (73 y.o. [name] Primary Care Provider: Chrisandra Netters Other Clinician: Referring Provider: Treating Provider/Extender:Robson, Nonda Lou, Ivan Anchors in Treatment: 46 History of Present Illness HPI Description: 73 year old patient who is known to our practice from May of last year was fully worked up with a venous and arterial duplex study and was referred to the vascular surgeon for follow-up. The patient says she has seen the vascular surgeons and has an appointment back in April. No procedure was done. She has developed a blister on the left big toe and forefoot which has been fairly large and draining fluid and she self-referred herself to [place]. The patient was recently seen in the ER on 10/06/2016, and was treated with clindamycin and asked to see the podiatrist and PCP. She saw the podiatrist Dr. Felisa Bonier on 10/11/2016 who I understand did an x-ray but no report is available. Of note last year when I saw her, I had referred her to do Dr. Ruta Hinds -- he saw on 02/04/2016. he reviewed and noted that she had evidence of peripheral arterial disease and recommended a follow-up in 6 months for repeat noninvasive arterial exam. Wounds worsened he would consider an angiogram. He saw her back on 03/14/2016, and continued conservative treatment and asked her to come back for noninvasive studies in 6 months. 12/03/2016 -- was seen in the office on 11/28/2016 and note is made of the fact that venous duplex examination on 01/02/2016 showed no evidence of reflux. Did have evidence of peripheral arterial disease and was asked to follow- up in 6 months time. Recent data on 11/28/2016 showed right noncompressible with monophasic waveforms left noncompressible with monophasic waveforms. The thought was she has medial calcifications of the arteries and had bilateral toe brachial indices which were normal. Right toe brachial  index was 0.74 and the left toe brachial index was 0.80 She would return for a vascular study in 6 months when her ABI would be checked again. 7/6-Patient is back at 1 week visit to the  clinic, home health is attending to the wound once a week, we have been using silver alginate, MRI scheduled this week on Thursday for the left foot. We are seeing her for the left foot plantar ulcer and the right great toe ulcer 12/24/2016 -- the left plantar heel had a large callus which came off during her evaluation and there was an open ulcer at the base ==== Old notes She was recently seen by her PCP for an ulcer of the left toe which was treated at the urgent care by giving her ciprofloxacin which she has been taking. She has uncontrolled diabetes and A1c was 13.7 done last week. Past medical history significant for poorly controlled diabetes mellitus type 2, obesity, osteopenia, GERD, diabetic retinopathy, diabetic neuropathy, reported arthritis, hypothyroidism, hypertension. No x-rays were done recently. 12/27/2015 -- had an x-ray of the left foot --IMPRESSION:Juxta-articular erosions at the IP joint great toe and diffusely in the MTP joints greatest at first, suggesting an inflammatory arthropathy such as rheumatoid arthritis or gout.While septic arthritis can cause juxta-articular erosions, the multiplicity of joints involved makes this less likely. Significant soft tissue swelling with single tiny questionable focus of soft tissue gas medial to the first metatarsal head. If patient has persistent symptoms or persistent clinical concern for osteomyelitis, recommend MR imaging of the LEFT foot (with and without contrast if renal function permits). 01/03/2016 -- the lower extremity venous duplex reflex evaluation showed no evidence of deep or superficial thrombus or reflux. The arterial study done and showed the resting right ABI is greater than 1.3 and the left ABI was greater than 1.3 indicating  arterial medial calcification. The right TBI was less than 0.64 and the left ABI was less than 0.64 both of which are abnormal. except for the left posterior tibial which is triphasic on the flow is biphasic. With the above results due to the fact that she has diabetes mellitus and would recommend she sees the vascular surgeon to see if any further intervention or procedure needs to be planned. ====== READMISSION 09/19/17;this is a 73 year old woman that we've had in our clinic previously for wounds on predominantly her left foot left toe and left heel. She is a diabetic. She has had lower extremity workups for both venous reflux disease and arterial disease. She has known noncompressible vessels bilaterally however in May 2017 as noted above her TBI was 0.64 and the left TBI was less than 0.64. When she was last in clinicin May 2017 the left heel wound had closed. She was faithfully wearing her juxta light stockings. She tells me 2-3 weeks ago she developed increasing edema in her legs. She was seen by Dr. Valentino Saxon of nephrology who apparently increased her Lasix from 40-80 mg twice a day. The patient has not noticed any improvement in the edema. She does not weigh herself. About a week ago she noted blisters on her legs on the right 2 on the left 1 since then she has not been wearing the juxta light stockings. She has not had any pain no shortness of breath 09/26/17;; the patient has a stable wound as a result of blistering on the left anterior calf and 2 on the right anterior and right medial. There is another denuded blister on the left anterior more superior. This does not have any current fluid I did not remove the skin today. We've been using silver alginate under compression 10/03/17; the patient's right leg is healed. The area on the left anterior leg all wound sites  look better. We've been using silver alginate under compression. In questioning the patient apparently has a juxta light  stockings however she has not been using these. We asked her to find them lubricate her skin every night and used a juxta light stocking on the right. I would like to see this next time 10/10/17; both the patient's areas on the left anterior leg 2 are closed the area on the look right posterior medial calf remains closed. She is been using a juxta light stocking on the right she has one for the left Readmission Mrs. Joya Gaskins is now a 73 year old woman we have had in this clinic at least 3 times before. Most recently she was here in February 2019 for 2 visits with wounds on her bilateral lower extremities because of blisters these healed fairly quickly and we discharged her in juxta lite stockings. She was previously here in 2018 with a wound on her left foot and in 2017 she was sent to see Dr. Oneida Alar for follow-up of PAD. Indeed she seems to have had follow-up noninvasive vascular studies every 6 months. Her current problems began at the end of October she apparently suffered a fall with a laceration on her right anterior leg. She had this sutured on 06/23/2018. Sometime in the same timeframe she developed blistering over a large area of the left anterior calf, the right great toe. The blisters have spontaneously ruptured and she has large areas of the epithelial loss. She also has non-open blisters on the medial part of the left great toe. There is also an open area on the right second toe. I do not believe that she was using her compression stockings [not juxta lites] because the swelling in her legs had gotten too large for her to wrap. She tells me she is recently been to her nephrologist and had her Lasix increased which is helped somewhat with the swelling. The patient did not have arterial studies done in our clinic. Her most recent noninvasive studies were in August. These showed ABIs noncompressible bilaterally. She had TBI's on the right at 0.54 and on the left at 0.60 biphasic waveforms at  the PTA and DP bilaterally. She was felt to have bilateral ABIs that were unchanged from her previous study in October 2018. However it was noted her TBI's were decreased. Past medical history includes type 2 diabetes with peripheral neuropathy with a recent hemoglobin A1c of 7. She is apparently off her diabetes medications, she has stage III chronic renal failure, hyperlipidemia, hypertension, cataracts, retinopathy, rheumatoid arthritis, iron deficiency and apparently is receiving IV iron, urge incontinence 07/10/2018; she arrives today with everything looking quite a bit better. The large area of blistered denuded epithelium on the left calf has healed over. The area on the right lateral tibial area is also mostly healed still with a linear open area where her laceration was. The area on her left medial toe is just about healed. She has a dark thick black eschar over the tip of her right first toe. I do not remember this from last week this feels almost like the eschar you would see with ischemia yet her toes are warm and her peripheral pulses are palpable. She does have some degree of PAD but I do not think that was sufficient enough to have caused this. 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 Patient originally came here with a laceration injury of her right lateral mid calf. She also had an  area of denuded skin on the left which is since closed over. She had a small area on the tip of her right great toe as well. She arrives today with what looks to be dry gangrene on the right great toe as well as discoloration of the tips of her second and third toes. She is not in any pain. She also had purulent drainage coming out open area on her left midfoot which was new this week. Culture was obtained The patient is known to vascular surgery having last been seen on 04/02/2018. They follow her for chronic arterial insufficiency. Last arterial studies were  in August 2017 at which time her ABIs were noncompressible however her TBI was 0.54 on the right and 0.60 on the left. She was noted to have bilateral ankle arteries remain calcified. Waveforms biphasic. Slight decline in bilateral TBI's 07/24/2018; the patient was graciously seen urgently by vein and vascular with regards to critical limb ischemia, she had a CO2 angiogram and then contrast to look at the lower extremity arteries. Surprisingly no significant vascular disease was noted in the aortic iliac set segment, patent common femoral and profunda arteries patent popliteal and three-vessel runoff with all 3 of the tibials present. Her dorsalis pedis and plantar arch vessels also filled. Notable for the fact that it was felt that she had small vessel disease near her toes but this was not amenable for intervention. The abscess that I unroofed last week on the plantar left foot grew Stenotrophomonas Maltophilia. This is not a usual true wound pathogen. However it had abundant organisms and given the fact that is an abscess I went ahead and treated this with Levaquin 250 daily for 7 days. I had her on doxycycline last week 07/31/2018; I put 3 layer compression on the right leg last week. She arrives in today with almost no edema in the right leg and out of concern for the this in the gangrenous toes I am going to reduce this back to Kerlix and Coban which is what we had her on at the beginning. We are using silver alginate to the traumatic wound on her right anterior leg and this looks a lot better. The area on the left foot is healed and the patient is using her own wraparound compression stocking here. She still has the dry gangrene-like changes predominantly involving the right first toe, tip of her right second and third toes. We are using Betadine here. The exact etiology of this was not completely clear 08/07/18; we've been using 2 layer compression on the right leg. She still has the  gangrenous toes for second and third on the right and the original laceration injury. She does not have any significant vascular issues to the level of the ankle. She does have small vessel disease in her toes.The left mid foot wound is closed 08/14/18; the patient arrives in clinic today with improvement in the original area on her right lateral leg. The 3 dry gangrenous toes first second and third on the right all look stable except for the second. This was obviously separating. It was removed fortunately there is healthy-looking surface tissue underneath this. She arrives in clinic with a reopening on the left midfoot. This was previously a small abscess that had closed. She has Charcot feet bilaterally 08/24/2018; right lateral leg wound is smaller. The 3 dry gangrenous first second and third toes are a bit changed. The third toe was separating I remove the eschar and subcutaneous tissue to reveal a  small open area at the tip of the toe just at the base at the head of the nail. The first toe is unchanged. Left plantar foot is larger. This is in the Charcot foot area. I removed nonviable circumference and surface from the wound using silver alginate 08/31/2018; right lateral leg wound continues to contract also the area on her plantar foot. First great toe is beginning to separate and the nail will soon I think fall off.. She sees vein and vascular tomorrow. 1/20; right lateral leg wound continues to contract and is almost closed first great toe is beginning to separate. The area on her left plantar foot still has rolled edges around the wound and some debris in the surface but generally smaller. We have been using silver alginate to the 2 remaining wound areas and Betadine to the left great toe. The patient saw vein and vascular on 1/14. They did not add anything here. Noted that the patient has microvascular disease but no major macrovascular issues. No intervention was performed during her CO2  aortogram on 07/23/2018. 2/3; 2-week follow-up. Right leg wound which is her original wound and coming here is healed. Second and third right toes have healed. She still has dry eschared/dry gangrene over the right first toe. I thought this might begin to separate but it really is not making great headway with that. The area on the left midfoot reopened 2 to 3 weeks ago and this is made no progress. Finally she has new blisters on the left first toe laterally and the left second toe dorsally over the DIP at the base of the nail 2/17; 2-week follow-up. Right leg wound which is her original wound is healed. Right second and third toes are still healed. She still has the black ischemic eschar over the first toe which has not really begun to separate. The area on the left midfoot is unfortunately worse with large amounts of denuded skin around this. She has blisters on the medial part of her first toe and the dorsal part of the second toe on the left 2/24; she still has the black ischemic eschar over the first toe which is not really separating. The area on the left midfoot seems stable from last week. The blisters on the medial part of her first toe and dorsal second toe had hardened and I removed both of these there are small open areas underneath. 3/2; left first and second toes are healed. The area on the left plantar foot looks better. Finally the right great toe area is beginning to separate. 3/9; left first and second toes remain healed. Left plantar foot continues to look excellent smaller with a healthy base. Finally the right great toe had separated further. I went ahead and remove the eschar here. There is still a fairly sizable wound here but this will give [place] a better chance to address this. 3/16-Left first and second toes mostly healed although the left first toe appears to have an open area Right great toe deep ulcer with exposed bone, greenish debris with necrotic tissue, right second  toe wound with clean edges and base. Patient had silver alginate dressing to all the toes especially on the right. SHe has been offloading with inserts and open toe shoes 3/23; since I last saw this patient 2 weeks ago there is been quite a bit of deterioration. The left plantar foot continues to look about the same. She now has an area on the left lateral first toe. On the right  foot the first toe has a fair amount of exposed bone. X-ray that we did last week did not show osteomyelitis nevertheless that would have to be a concern. She also has an area on the tip of the right second toe that is new 3/30; some improvement in the left plantar foot. Bone I took out of the right first toe last week did not show osteomyelitis under pathology however culture did grow rare staph aureus and rare Citrobacter. We have been using silver alginate to all her wounds 4/6; left plantar foot wound about the same although this does not look ominous. The right first toe which is been very problematic does not have exposed bone aggressive debridement of the surface of this. She does have grams of epithelialization. We have been using silver alginate on both wounds 4/13; left plantar foot wound about the same. Removed skin and subcutaneous tissue from the wound circumference. The right great toe has underlying osteomyelitis I have extended her Levaquin 500 mg for another 2 weeks. She does not complain of any overt side effects. 4/23;. The patient has wounds on the left plantar foot, left lateral great toe and a new area on the dorsal part of the right second toe. Her large area over the tip of her right great toe. She is on Levaquin I will need to check on when we want to finish this. This is for underlying osteomyelitis of the right great toe 5/4; the patient has or should be completing her Levaquin at this point. This is for osteomyelitis in the right great toe. She also has wounds on her left plantar foot and  the dorsal part of her right second toe. We have been using silver alginate 5/11; patient arrives today with her right great toe generally looking better. However the area on the plantar left foot had considerable undermining and discomfort around the wound and some erythema. We have been using silver alginate 5/18-Patient returns at 1 week the right great toe is much more macerated around the edges of the wound, the left foot wounds are considerably better especially the left great toe and left second toe. We have been using silver alginate to all wounds and juxta lite to the right leg and 2 layer compression to the left. THere is now a fluid filled bleb on the posterior calf of RLE 5/26; right great toe continues to improve. The underlying osteomyelitis was treated empirically in this clinic. Left midfoot wound also looks better than the last time I saw this 2 weeks ago. Apparently last week she had a blister on the right posterior calf that was largely. This was not specifically addressed. It is open since last time she has a large area on the right posterior mid calf with skin attached to a large amount of the circumference. 6/1; we continue to have true improvements in both of the wound areas on the right great toe and left plantar foot. The large blister on the posterior right calf is just about fully epithelialized although there is still some weeping edema in this area 6/8; we continued to have been improvements in both areas currently on the right great toe and left plantar foot. The large blister on the posterior right calf is fully healed. 6/15; we we continue to have improvements in the right great toe. The left plantar foot which was a small wound without undermining last week has decided to expand into the lateral foot. There is some swelling in this area. She is  not systemically unwell 6/22; the culture of her left foot that I did last week grew Enterobacter I had her on  doxycycline. There was also clearly some purulent drainage coming from this today through the original wound with the swelling on the lateral part of her foot. She is not systemically unwell. 6/25 the patient is on Levaquin for the Enterobacter cultured out of her left foot. She now has an extensive wound area in the medial left foot we are using silver alginate The patient complains of pain in the foot going up towards the metatarsal head. There is also swelling inferiorly to this area that we identified last week but this is not tender 6/29; the patient is completing her Levaquin for the Enterobacter. We are using silver alginate to the extensive wound on the left plantar foot as well as to the right great toe 7/14; the right great toe is healed today. The area on the left foot looks better. The MRI of the left foot did not show osteomyelitis in the area of the wound however it did show an effusion in the tibiotalar joint. The patient has rheumatoid arthritis 7/20; right great toe remains healed. The area on the left foot continues to look improved. 7/27 right great toe remains healed. The area on the left foot continues to contract. Healthy granulation tissue she is only offloading this in surgical shoes. 03/24/2019 on evaluation today patient appears to be doing very well with regard to her plantar foot ulcer. She has been tolerating the dressing changes without complication. She has a healthy granulation surface. There is no signs of active infection at this time and her swelling seems to be under good control. 8/17; the areas on the left medial midfoot. Probably subluxed bone in this area. Wounds are superficial. It looks as though there was some blistering skin at one point 8/24; the area on the left medial midfoot. This is completely resolved at this point although it still looks somewhat vulnerable. She probably has subluxed bone underneath this area. She has pes planus deformity 9/14;  apparently this wound reopened in the 3 weeks since I saw her. She was seen last week she is given new healing sandals. Been using calcium alginate with felt offloading. This is not surprising given the area looks somewhat vulnerable. 9/28; left plantar foot in the setting of diabetic neuropathy probably some degree of Charcot deformity. She also has abrasions on the left leg which she says are due to the Curlex co-band that was put on by home health. She is also having a lot of pain in the left calf finds the wrap too tight 10/5; left plantar foot has 2 superficial open areas. The abrasions on the left leg from last week are healed. She still has some swelling and tenderness however. Duplex ultrasound of the leg tomorrow tomorrow. Electronic Signature(s) Signed: 05/24/2019 6:11:18 PM By: Linton Ham MD Entered By: Linton Ham on 05/24/2019 12:04:32 -------------------------------------------------------------------------------- Physical Exam Details Patient Name: Date of Service: [name] 05/24/2019 10:15 AM Medical Record [id number] Patient Account Number: [id number] Date of Birth/Sex: Treating RN: Jan 10, 1946 (73 y.o. [name] Primary Care Provider: Chrisandra Netters Other Clinician: Referring Provider: Treating Provider/Extender:Robson, Nonda Lou, Ivan Anchors in Treatment: 63 Constitutional Patient is hypertensive.. Pulse regular and within target range for patient.Marland Kitchen Respirations regular, non-labored and within target range.. Temperature is normal and within the target range for the patient.Marland Kitchen Appears in no distress. Eyes Conjunctivae clear. No discharge.no icterus. Respiratory work of breathing is  normal. Cardiovascular Pedal pulses are palpable on the left. Nonpitting edema on the left greater than right leg. Lymphatic None palpable in the popliteal area. Psychiatric appears at normal baseline. Notes Wound exam; no open wounds on the  right leg or foot. The left leg wounds from last week the abrasions on the pretibial area are healed. There is still some swelling and tenderness in the left leg however. I think this is a lot better however. On the left foot 2 superficial wounds present however callus debris over the surface of both wounds debrided with a #5 curette. Hemostasis with direct pressure Electronic Signature(s) Signed: 05/24/2019 6:11:18 PM By: Linton Ham MD Entered By: Linton Ham on 05/24/2019 12:31:38 -------------------------------------------------------------------------------- Physician Orders Details Patient Name: Date of Service: [name] 05/24/2019 10:15 AM Medical Record [id number] Patient Account Number: [id number] Date of Birth/Sex: Treating RN: 09/12/1945 (72 y.o. Clearnce Sorrel Primary Care Provider: Chrisandra Netters Other Clinician: Referring Provider: Treating Provider/Extender:Robson, Nonda Lou, Ivan Anchors in Treatment: 86 Verbal / Phone Orders: No Diagnosis Coding ICD-10 Coding Code Description E11.42 Type 2 diabetes mellitus with diabetic polyneuropathy L97.518 Non-pressure chronic ulcer of other part of right foot with other specified severity L97.521 Non-pressure chronic ulcer of other part of left foot limited to breakdown of skin L02.612 Cutaneous abscess of left foot S80.812D Abrasion, left lower leg, subsequent encounter Follow-up Appointments Wound #19 Left,Plantar Foot Return Appointment in 1 week. Dressing Change Frequency Wound #19 Left,Plantar Foot Other: - Change 2 times per week Skin Barriers/Peri-Wound Care Wound #19 Left,Plantar Foot Moisturizing lotion - to both legs Wound Cleansing Wound #19 Left,Plantar Foot Clean wound with Wound Cleanser Primary Wound Dressing Wound #19 Left,Plantar Foot Calcium Alginate Secondary Dressing Wound #19 Left,Plantar Foot Foam Dry Gauze Edema Control Wound #19 Left,Plantar  Foot Kerlix and Coban - Left Lower Extremity Avoid standing for long periods of time Elevate legs to the level of the heart or above for 30 minutes daily and/or when sitting, a frequency of: - throughout the day Support Garment 20-30 mm/Hg pressure to: - juxtalite to right leg Off-Loading Open toe surgical shoe to: - left foot with felt. [facility] for [facility] care. Lajean Manes Electronic Signature(s) Signed: 05/24/2019 6:11:18 PM By: Linton Ham MD Signed: 05/27/2019 4:29:59 PM By: Kela Millin Entered By: Kela Millin on 05/24/2019 11:36:56 -------------------------------------------------------------------------------- Problem List Details Patient Name: Date of Service: KAIYLA, STAHLY 05/24/2019 10:15 AM Medical Record [id number] Patient Account Number: [id number] Date of Birth/Sex: Treating RN: September 06, 1945 (73 y.o. [name] Primary Care Provider: Chrisandra Netters Other Clinician: Referring Provider: Treating Provider/Extender:Robson, Nonda Lou, Ivan Anchors in Treatment: 46 Active Problems ICD-10 Evaluated Encounter Code Description Active Date Today Diagnosis E11.42 Type 2 diabetes mellitus with diabetic polyneuropathy 07/03/2018 No Yes L97.518 Non-pressure chronic ulcer of other part of right foot 07/20/2018 No Yes with other specified severity L97.521 Non-pressure chronic ulcer of other part of left foot 08/15/2018 No Yes limited to breakdown of skin L02.612 Cutaneous abscess of left foot 07/20/2018 No Yes S80.812D Abrasion, left lower leg, subsequent encounter 05/17/2019 No Yes Inactive Problems ICD-10 Code Description Active Date Inactive Date L97.511 Non-pressure chronic ulcer of other part of right foot limited to 07/03/2018 07/03/2018 breakdown of skin E11.52 Type 2 diabetes mellitus with diabetic peripheral angiopathy 07/20/2018 07/20/2018 with gangrene Resolved Problems ICD-10 Code  Description Active Date Resolved Date S81.811D Laceration without foreign body, right lower leg, subsequent 07/03/2018 07/03/2018 encounter L97.821 Non-pressure chronic ulcer of other part of left  lower leg limited 07/03/2018 07/03/2018 to breakdown of skin L97.211 Non-pressure chronic ulcer of right calf limited to breakdown of 01/12/2019 01/12/2019 skin Electronic Signature(s) Signed: 05/24/2019 6:11:18 PM By: Linton Ham MD Entered By: Linton Ham on 05/24/2019 12:01:22 -------------------------------------------------------------------------------- Progress Note Details Patient Name: Date of Service: [name] 05/24/2019 10:15 AM Medical Record [id number] Patient Account Number: [id number] Date of Birth/Sex: Treating RN: 27-Dec-1945 (73 y.o. [name] Primary Care Provider: Chrisandra Netters Other Clinician: Referring Provider: Treating Provider/Extender:Robson, Nonda Lou, Ivan Anchors in Treatment: 46 Subjective History of Present Illness (HPI) 73 year old patient who is known to our practice from May of last year was fully worked up with a venous and arterial duplex study and was referred to the vascular surgeon for follow-up. The patient says she has seen the vascular surgeons and has an appointment back in April. No procedure was done. She has developed a blister on the left big toe and forefoot which has been fairly large and draining fluid and she self-referred herself to [place]. The patient was recently seen in the ER on 10/06/2016, and was treated with clindamycin and asked to see the podiatrist and PCP. She saw the podiatrist Dr. Felisa Bonier on 10/11/2016 who I understand did an x-ray but no report is available. Of note last year when I saw her, I had referred her to do Dr. Ruta Hinds -- he saw on 02/04/2016. he reviewed and noted that she had evidence of peripheral arterial disease and recommended a follow-up in 6 months for  repeat noninvasive arterial exam. Wounds worsened he would consider an angiogram. He saw her back on 03/14/2016, and continued conservative treatment and asked her to come back for noninvasive studies in 6 months. 12/03/2016 -- was seen in the office on 11/28/2016 and note is made of the fact that venous duplex examination on 01/02/2016 showed no evidence of reflux. Did have evidence of peripheral arterial disease and was asked to follow- up in 6 months time. Recent data on 11/28/2016 showed right noncompressible with monophasic waveforms left noncompressible with monophasic waveforms. The thought was she has medial calcifications of the arteries and had bilateral toe brachial indices which were normal. Right toe brachial index was 0.74 and the left toe brachial index was 0.80 She would return for a vascular study in 6 months when her ABI would be checked again. 7/6-Patient is back at 1 week visit to the clinic, home health is attending to the wound once a week, we have been using silver alginate, MRI scheduled this week on Thursday for the left foot. We are seeing her for the left foot plantar ulcer and the right great toe ulcer 12/24/2016 -- the left plantar heel had a large callus which came off during her evaluation and there was an open ulcer at the base ==== Old notes She was recently seen by her PCP for an ulcer of the left toe which was treated at the urgent care by giving her ciprofloxacin which she has been taking. She has uncontrolled diabetes and A1c was 13.7 done last week. Past medical history significant for poorly controlled diabetes mellitus type 2, obesity, osteopenia, GERD, diabetic retinopathy, diabetic neuropathy, reported arthritis, hypothyroidism, hypertension. No x-rays were done recently. 12/27/2015 -- had an x-ray of the left foot --IMPRESSION:Juxta-articular erosions at the IP joint great toe and diffusely in the MTP joints greatest at first, suggesting an  inflammatory arthropathy such as rheumatoid arthritis or gout.While septic arthritis can cause juxta-articular erosions, the multiplicity of joints involved makes  this less likely. Significant soft tissue swelling with single tiny questionable focus of soft tissue gas medial to the first metatarsal head. If patient has persistent symptoms or persistent clinical concern for osteomyelitis, recommend MR imaging of the LEFT foot (with and without contrast if renal function permits). 01/03/2016 -- the lower extremity venous duplex reflex evaluation showed no evidence of deep or superficial thrombus or reflux. The arterial study done and showed the resting right ABI is greater than 1.3 and the left ABI was greater than 1.3 indicating arterial medial calcification. The right TBI was less than 0.64 and the left ABI was less than 0.64 both of which are abnormal. except for the left posterior tibial which is triphasic on the flow is biphasic. With the above results due to the fact that she has diabetes mellitus and would recommend she sees the vascular surgeon to see if any further intervention or procedure needs to be planned. ====== READMISSION 09/19/17;this is a 73 year old woman that we've had in our clinic previously for wounds on predominantly her left foot left toe and left heel. She is a diabetic. She has had lower extremity workups for both venous reflux disease and arterial disease. She has known noncompressible vessels bilaterally however in May 2017 as noted above her TBI was 0.64 and the left TBI was less than 0.64. When she was last in clinicin May 2017 the left heel wound had closed. She was faithfully wearing her juxta light stockings. She tells me 2-3 weeks ago she developed increasing edema in her legs. She was seen by Dr. Valentino Saxon of nephrology who apparently increased her Lasix from 40-80 mg twice a day. The patient has not noticed any improvement in the edema. She does not weigh  herself. About a week ago she noted blisters on her legs on the right o2 on the left o1 since then she has not been wearing the juxta light stockings. She has not had any pain no shortness of breath 09/26/17;; the patient has a stable wound as a result of blistering on the left anterior calf and 2 on the right anterior and right medial. There is another denuded blister on the left anterior more superior. This does not have any current fluid I did not remove the skin today. We've been using silver alginate under compression 10/03/17; the patient's right leg is healed. The area on the left anterior leg all wound sites look better. We've been using silver alginate under compression. In questioning the patient apparently has a juxta light stockings however she has not been using these. We asked her to find them lubricate her skin every night and used a juxta light stocking on the right. I would like to see this next time 10/10/17; both the patient's areas on the left anterior leg o2 are closed the area on the look right posterior medial calf remains closed. She is been using a juxta light stocking on the right she has one for the left Readmission Mrs. Joya Gaskins is now a 73 year old woman we have had in this clinic at least 3 times before. Most recently she was here in February 2019 for 2 visits with wounds on her bilateral lower extremities because of blisters these healed fairly quickly and we discharged her in juxta lite stockings. She was previously here in 2018 with a wound on her left foot and in 2017 she was sent to see Dr. Oneida Alar for follow-up of PAD. Indeed she seems to have had follow-up noninvasive vascular studies every 6 months.  Her current problems began at the end of October she apparently suffered a fall with a laceration on her right anterior leg. She had this sutured on 06/23/2018. Sometime in the same timeframe she developed blistering over a large area of the left anterior calf, the  right great toe. The blisters have spontaneously ruptured and she has large areas of the epithelial loss. She also has non-open blisters on the medial part of the left great toe. There is also an open area on the right second toe. I do not believe that she was using her compression stockings [not juxta lites] because the swelling in her legs had gotten too large for her to wrap. She tells me she is recently been to her nephrologist and had her Lasix increased which is helped somewhat with the swelling. The patient did not have arterial studies done in our clinic. Her most recent noninvasive studies were in August. These showed ABIs noncompressible bilaterally. She had TBI's on the right at 0.54 and on the left at 0.60 biphasic waveforms at the PTA and DP bilaterally. She was felt to have bilateral ABIs that were unchanged from her previous study in October 2018. However it was noted her TBI's were decreased. Past medical history includes type 2 diabetes with peripheral neuropathy with a recent hemoglobin A1c of 7. She is apparently off her diabetes medications, she has stage III chronic renal failure, hyperlipidemia, hypertension, cataracts, retinopathy, rheumatoid arthritis, iron deficiency and apparently is receiving IV iron, urge incontinence 07/10/2018; she arrives today with everything looking quite a bit better. The large area of blistered denuded epithelium on the left calf has healed over. The area on the right lateral tibial area is also mostly healed still with a linear open area where her laceration was. The area on her left medial toe is just about healed. She has a dark thick black eschar over the tip of her right first toe. I do not remember this from last week this feels almost like the eschar you would see with ischemia yet her toes are warm and her peripheral pulses are palpable. She does have some degree of PAD but I do not think that was sufficient enough to have caused  this. 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 Patient originally came here with a laceration injury of her right lateral mid calf. She also had an area of denuded skin on the left which is since closed over. She had a small area on the tip of her right great toe as well. She arrives today with what looks to be dry gangrene on the right great toe as well as discoloration of the tips of her second and third toes. She is not in any pain. She also had purulent drainage coming out open area on her left midfoot which was new this week. Culture was obtained The patient is known to vascular surgery having last been seen on 04/02/2018. They follow her for chronic arterial insufficiency. Last arterial studies were in August 2017 at which time her ABIs were noncompressible however her TBI was 0.54 on the right and 0.60 on the left. She was noted to have bilateral ankle arteries remain calcified. Waveforms biphasic. Slight decline in bilateral TBI's 07/24/2018; the patient was graciously seen urgently by vein and vascular with regards to critical limb ischemia, she had a CO2 angiogram and then contrast to look at the lower extremity arteries. Surprisingly no significant vascular disease was noted in the  aortic iliac set segment, patent common femoral and profunda arteries patent popliteal and three-vessel runoff with all 3 of the tibials present. Her dorsalis pedis and plantar arch vessels also filled. Notable for the fact that it was felt that she had small vessel disease near her toes but this was not amenable for intervention. The abscess that I unroofed last week on the plantar left foot grew Stenotrophomonas Maltophilia. This is not a usual true wound pathogen. However it had abundant organisms and given the fact that is an abscess I went ahead and treated this with Levaquin 250 daily for 7 days. I had her on doxycycline last week 07/31/2018; I put 3 layer  compression on the right leg last week. She arrives in today with almost no edema in the right leg and out of concern for the this in the gangrenous toes I am going to reduce this back to Kerlix and Coban which is what we had her on at the beginning. We are using silver alginate to the traumatic wound on her right anterior leg and this looks a lot better. The area on the left foot is healed and the patient is using her own wraparound compression stocking here. She still has the dry gangrene-like changes predominantly involving the right first toe, tip of her right second and third toes. We are using Betadine here. The exact etiology of this was not completely clear 08/07/18; we've been using 2 layer compression on the right leg. She still has the gangrenous toes for second and third on the right and the original laceration injury. She does not have any significant vascular issues to the level of the ankle. She does have small vessel disease in her toes.The left mid foot wound is closed 08/14/18; the patient arrives in clinic today with improvement in the original area on her right lateral leg. The 3 dry gangrenous toes first second and third on the right all look stable except for the second. This was obviously separating. It was removed fortunately there is healthy-looking surface tissue underneath this. ooShe arrives in clinic with a reopening on the left midfoot. This was previously a small abscess that had closed. She has Charcot feet bilaterally 08/24/2018; right lateral leg wound is smaller. The 3 dry gangrenous first second and third toes are a bit changed. The third toe was separating I remove the eschar and subcutaneous tissue to reveal a small open area at the tip of the toe just at the base at the head of the nail. The first toe is unchanged. ooLeft plantar foot is larger. This is in the Charcot foot area. I removed nonviable circumference and surface from the wound using silver  alginate 08/31/2018; right lateral leg wound continues to contract also the area on her plantar foot. First great toe is beginning to separate and the nail will soon I think fall off.. She sees vein and vascular tomorrow. 1/20; right lateral leg wound continues to contract and is almost closed first great toe is beginning to separate. The area on her left plantar foot still has rolled edges around the wound and some debris in the surface but generally smaller. We have been using silver alginate to the 2 remaining wound areas and Betadine to the left great toe. The patient saw vein and vascular on 1/14. They did not add anything here. Noted that the patient has microvascular disease but no major macrovascular issues. No intervention was performed during her CO2 aortogram on 07/23/2018. 2/3; 2-week follow-up. Right leg  wound which is her original wound and coming here is healed. Second and third right toes have healed. She still has dry eschared/dry gangrene over the right first toe. I thought this might begin to separate but it really is not making great headway with that. The area on the left midfoot reopened 2 to 3 weeks ago and this is made no progress. Finally she has new blisters on the left first toe laterally and the left second toe dorsally over the DIP at the base of the nail 2/17; 2-week follow-up. Right leg wound which is her original wound is healed. Right second and third toes are still healed. She still has the black ischemic eschar over the first toe which has not really begun to separate. The area on the left midfoot is unfortunately worse with large amounts of denuded skin around this. She has blisters on the medial part of her first toe and the dorsal part of the second toe on the left 2/24; she still has the black ischemic eschar over the first toe which is not really separating. The area on the left midfoot seems stable from last week. The blisters on the medial part of her first  toe and dorsal second toe had hardened and I removed both of these there are small open areas underneath. 3/2; left first and second toes are healed. The area on the left plantar foot looks better. Finally the right great toe area is beginning to separate. 3/9; left first and second toes remain healed. Left plantar foot continues to look excellent smaller with a healthy base. Finally the right great toe had separated further. I went ahead and remove the eschar here. There is still a fairly sizable wound here but this will give [place] a better chance to address this. 3/16-Left first and second toes mostly healed although the left first toe appears to have an open area Right great toe deep ulcer with exposed bone, greenish debris with necrotic tissue, right second toe wound with clean edges and base. Patient had silver alginate dressing to all the toes especially on the right. SHe has been offloading with inserts and open toe shoes 3/23; since I last saw this patient 2 weeks ago there is been quite a bit of deterioration. The left plantar foot continues to look about the same. She now has an area on the left lateral first toe. On the right foot the first toe has a fair amount of exposed bone. X-ray that we did last week did not show osteomyelitis nevertheless that would have to be a concern. She also has an area on the tip of the right second toe that is new 3/30; some improvement in the left plantar foot. Bone I took out of the right first toe last week did not show osteomyelitis under pathology however culture did grow rare staph aureus and rare Citrobacter. We have been using silver alginate to all her wounds 4/6; left plantar foot wound about the same although this does not look ominous. The right first toe which is been very problematic does not have exposed bone aggressive debridement of the surface of this. She does have grams of epithelialization. We have been using silver alginate on both  wounds 4/13; left plantar foot wound about the same. Removed skin and subcutaneous tissue from the wound circumference. ooThe right great toe has underlying osteomyelitis I have extended her Levaquin 500 mg for another 2 weeks. She does not complain of any overt side effects. 4/23;. The patient  has wounds on the left plantar foot, left lateral great toe and a new area on the dorsal part of the right second toe. Her large area over the tip of her right great toe. She is on Levaquin I will need to check on when we want to finish this. This is for underlying osteomyelitis of the right great toe 5/4; the patient has or should be completing her Levaquin at this point. This is for osteomyelitis in the right great toe. She also has wounds on her left plantar foot and the dorsal part of her right second toe. We have been using silver alginate 5/11; patient arrives today with her right great toe generally looking better. However the area on the plantar left foot had considerable undermining and discomfort around the wound and some erythema. We have been using silver alginate 5/18-Patient returns at 1 week the right great toe is much more macerated around the edges of the wound, the left foot wounds are considerably better especially the left great toe and left second toe. We have been using silver alginate to all wounds and juxta lite to the right leg and 2 layer compression to the left. THere is now a fluid filled bleb on the posterior calf of RLE 5/26; right great toe continues to improve. The underlying osteomyelitis was treated empirically in this clinic. ooLeft midfoot wound also looks better than the last time I saw this 2 weeks ago. ooApparently last week she had a blister on the right posterior calf that was largely. This was not specifically addressed. It is open since last time she has a large area on the right posterior mid calf with skin attached to a large amount of the  circumference. 6/1; we continue to have true improvements in both of the wound areas on the right great toe and left plantar foot. The large blister on the posterior right calf is just about fully epithelialized although there is still some weeping edema in this area 6/8; we continued to have been improvements in both areas currently on the right great toe and left plantar foot. The large blister on the posterior right calf is fully healed. 6/15; we we continue to have improvements in the right great toe. The left plantar foot which was a small wound without undermining last week has decided to expand into the lateral foot. There is some swelling in this area. She is not systemically unwell 6/22; the culture of her left foot that I did last week grew Enterobacter I had her on doxycycline. There was also clearly some purulent drainage coming from this today through the original wound with the swelling on the lateral part of her foot. She is not systemically unwell. 6/25 the patient is on Levaquin for the Enterobacter cultured out of her left foot. She now has an extensive wound area in the medial left foot we are using silver alginate The patient complains of pain in the foot going up towards the metatarsal head. There is also swelling inferiorly to this area that we identified last week but this is not tender 6/29; the patient is completing her Levaquin for the Enterobacter. We are using silver alginate to the extensive wound on the left plantar foot as well as to the right great toe 7/14; the right great toe is healed today. The area on the left foot looks better. The MRI of the left foot did not show osteomyelitis in the area of the wound however it did show an effusion in  the tibiotalar joint. The patient has rheumatoid arthritis 7/20; right great toe remains healed. The area on the left foot continues to look improved. 7/27 right great toe remains healed. The area on the left foot continues  to contract. Healthy granulation tissue she is only offloading this in surgical shoes. 03/24/2019 on evaluation today patient appears to be doing very well with regard to her plantar foot ulcer. She has been tolerating the dressing changes without complication. She has a healthy granulation surface. There is no signs of active infection at this time and her swelling seems to be under good control. 8/17; the areas on the left medial midfoot. Probably subluxed bone in this area. Wounds are superficial. It looks as though there was some blistering skin at one point 8/24; the area on the left medial midfoot. This is completely resolved at this point although it still looks somewhat vulnerable. She probably has subluxed bone underneath this area. She has pes planus deformity 9/14; apparently this wound reopened in the 3 weeks since I saw her. She was seen last week she is given new healing sandals. Been using calcium alginate with felt offloading. This is not surprising given the area looks somewhat vulnerable. 9/28; left plantar foot in the setting of diabetic neuropathy probably some degree of Charcot deformity. She also has abrasions on the left leg which she says are due to the Curlex co-band that was put on by home health. She is also having a lot of pain in the left calf finds the wrap too tight 10/5; left plantar foot has 2 superficial open areas. The abrasions on the left leg from last week are healed. She still has some swelling and tenderness however. Duplex ultrasound of the leg tomorrow tomorrow. Objective Constitutional Patient is hypertensive.. Pulse regular and within target range for patient.Marland Kitchen Respirations regular, non-labored and within target range.. Temperature is normal and within the target range for the patient.Marland Kitchen Appears in no distress. Vitals Time Taken: 11:00 AM, Height: 62 in, Weight: 140 lbs, BMI: 25.6, Temperature: 98.3 F, Pulse: 86 bpm, Respiratory Rate: 16 breaths/min,  Blood Pressure: 150/77 mmHg. Eyes Conjunctivae clear. No discharge.no icterus. Respiratory work of breathing is normal. Cardiovascular Pedal pulses are palpable on the left. Nonpitting edema on the left greater than right leg. Lymphatic None palpable in the popliteal area. Psychiatric appears at normal baseline. General Notes: Wound exam; no open wounds on the right leg or foot. The left leg wounds from last week the abrasions on the pretibial area are healed. There is still some swelling and tenderness in the left leg however. I think this is a lot better however. ooOn the left foot 2 superficial wounds present however callus debris over the surface of both wounds debrided with a #5 curette. Hemostasis with direct pressure Integumentary (Hair, Skin) Wound #19 status is Open. Original cause of wound was Gradually Appeared. The wound is located on the [place]. The wound measures 0.6cm length x 2.6cm width x 0.1cm depth; 1.225cm^2 area and 0.123cm^3 volume. There is Fat Layer (Subcutaneous Tissue) Exposed exposed. There is no tunneling or undermining noted. There is a medium amount of serosanguineous drainage noted. The wound margin is flat and intact. There is large (67-100%) red, pink granulation within the wound bed. There is no necrotic tissue within the wound bed. Wound #26 status is Healed - Epithelialized. Original cause of wound was Blister. The wound is located on the Left,Anterior Lower Leg. The wound measures 0cm length x 0cm width x 0cm depth;  0cm^2 area and 0cm^3 volume. 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 There was a Excisional Skin/Subcutaneous Tissue Debridement with a total area of 1.56 sq cm performed by Ricard Dillon., MD. With the following instrument(s): Curette to remove Viable and Non-Viable tissue/material. Material removed includes Callus and Subcutaneous Tissue and after achieving pain control using Lidocaine 5% topical ointment. No specimens were taken. A time out was conducted at 11:33, prior to the start of the procedure. There was no bleeding. The procedure was tolerated well with a pain level of 0 throughout and a pain level of 0 following the procedure. Post Debridement Measurements: 0.6cm length x 2.6cm width x 0.1cm depth; 0.123cm^3 volume. Character of Wound/Ulcer Post Debridement is improved. Severity of Tissue Post Debridement is: Fat layer exposed. Post procedure Diagnosis Wound #19: Same as Pre-Procedure Plan Follow-up Appointments: Wound #19 Left,Plantar Foot: Return Appointment in 1 week. Dressing Change Frequency: Wound #19 Left,Plantar Foot: Other: - Change 2 times per week Skin Barriers/Peri-Wound Care: Wound #19 Left,Plantar Foot: Moisturizing lotion - to both legs Wound Cleansing: Wound #19 Left,Plantar Foot: Clean wound with Wound Cleanser Primary Wound Dressing: Wound #19 Left,Plantar Foot: Calcium Alginate Secondary Dressing: Wound #19 Left,Plantar Foot: Foam Dry Gauze Edema Control: Wound #19 Left,Plantar Foot: Kerlix and Coban - Left Lower Extremity Avoid standing for long periods of time Elevate legs to the level of the heart or above for 30 minutes daily and/or when sitting, a frequency of: - throughout the day Support Garment 20-30 mm/Hg pressure to: - juxtalite to right leg Off-Loading: Open toe surgical shoe to: - left foot with felt. Home Health: [facility] for wound  care. - [facility] 1. We will continue with silver alginate to the left plantar foot everything is healed here 2. DVT rule out study tomorrow on the left leg however this looks a lot less impressive than last week Electronic Signature(s) Signed: 05/24/2019 12:32:13 PM By: Linton Ham MD Entered By: Linton Ham on 05/24/2019 12:32:13 -------------------------------------------------------------------------------- SuperBill Details Patient Name: Date of Service: [name] 05/24/2019 Medical Record [id number] Patient Account Number: [id number] Date of Birth/Sex: Treating RN: 1946/01/06 (73 y.o. [name] Primary Care Provider: Chrisandra Netters Other Clinician: Referring Provider: Treating Provider/Extender:Robson, Nonda Lou, Ivan Anchors in Treatment: 46 Diagnosis Coding ICD-10 Codes Code Description E11.42 Type 2 diabetes mellitus with diabetic polyneuropathy L97.518 Non-pressure chronic ulcer of other part of right foot with other specified severity L97.521 Non-pressure chronic ulcer of other part of left foot limited to breakdown of skin L02.612 Cutaneous abscess of left foot S80.812D Abrasion, left lower leg, subsequent encounter Facility Procedures The patient participates with Medicare or their insurance follows the Medicare Facility Guidelines: CPT4 Code Description Modifier Quantity [id number] 11042 - DEB SUBQ TISSUE 20 SQ CM/< 1 ICD-10 Diagnosis Description L97.521 Non-pressure chronic ulcer of  other part of left foot limited to breakdown of skin Physician Procedures CPT4 Code Description: [id number] 11042 - WC PHYS SUBQ TISS 20 SQ CM ICD-10 Diagnosis Description L97.521 Non-pressure chronic ulcer of other part of left foot limited Modifier: to breakdown Quantity:  1 of skin Electronic Signature(s) Signed: 05/24/2019 6:11:18 PM By: Linton Ham MD Entered By: Linton Ham on 05/24/2019 12:30:52

## 2019-05-28 DIAGNOSIS — E11621 Type 2 diabetes mellitus with foot ulcer: Secondary | ICD-10-CM | POA: Diagnosis not present

## 2019-05-28 DIAGNOSIS — D631 Anemia in chronic kidney disease: Secondary | ICD-10-CM | POA: Diagnosis not present

## 2019-05-28 DIAGNOSIS — E1142 Type 2 diabetes mellitus with diabetic polyneuropathy: Secondary | ICD-10-CM | POA: Diagnosis not present

## 2019-05-28 DIAGNOSIS — I10 Essential (primary) hypertension: Secondary | ICD-10-CM | POA: Diagnosis not present

## 2019-05-28 DIAGNOSIS — N2581 Secondary hyperparathyroidism of renal origin: Secondary | ICD-10-CM | POA: Diagnosis not present

## 2019-05-28 DIAGNOSIS — E8770 Fluid overload, unspecified: Secondary | ICD-10-CM | POA: Diagnosis not present

## 2019-05-28 DIAGNOSIS — L97421 Non-pressure chronic ulcer of left heel and midfoot limited to breakdown of skin: Secondary | ICD-10-CM | POA: Diagnosis not present

## 2019-05-28 DIAGNOSIS — E039 Hypothyroidism, unspecified: Secondary | ICD-10-CM | POA: Diagnosis not present

## 2019-05-28 DIAGNOSIS — N183 Chronic kidney disease, stage 3 unspecified: Secondary | ICD-10-CM | POA: Diagnosis not present

## 2019-05-28 DIAGNOSIS — I129 Hypertensive chronic kidney disease with stage 1 through stage 4 chronic kidney disease, or unspecified chronic kidney disease: Secondary | ICD-10-CM | POA: Diagnosis not present

## 2019-05-28 DIAGNOSIS — N189 Chronic kidney disease, unspecified: Secondary | ICD-10-CM | POA: Diagnosis not present

## 2019-05-31 ENCOUNTER — Ambulatory Visit
Admission: RE | Admit: 2019-05-31 | Discharge: 2019-05-31 | Disposition: A | Discharge status: Home / Self Care | Payer: Medicare Other | Source: Ambulatory Visit | Attending: Internal Medicine | Admitting: Internal Medicine

## 2019-05-31 ENCOUNTER — Other Ambulatory Visit: Payer: Self-pay

## 2019-05-31 ENCOUNTER — Telehealth (INDEPENDENT_AMBULATORY_CARE_PROVIDER_SITE_OTHER): Payer: Medicare Other | Admitting: Family Medicine

## 2019-05-31 DIAGNOSIS — M79662 Pain in left lower leg: Secondary | ICD-10-CM

## 2019-05-31 DIAGNOSIS — M7989 Other specified soft tissue disorders: Secondary | ICD-10-CM

## 2019-05-31 NOTE — Progress Notes (Signed)
Called patient for her scheduled telemedicine visit. This visit was to discuss perioperative risk for an upcoming planned surgery. I had asked her to schedule a visit because there was a question of her having blood clots, which is news to me. Patient confirms she is actually right now getting ultrasounds of her legs to evaluate for blood clots. She says these were ordered by the wound care center.  I advised I cannot discuss clearance for surgery until I hear more about this issue and we know the results of this ultrasound. Additionally this would be better done in person to fully assess patient. We decided to forego today's virtual visit, and I have scheduled her to come in and see me on Wednesday at 11am. Patient agreeable to this plan.  [name], MD

## 2019-06-01 ENCOUNTER — Other Ambulatory Visit: Payer: Self-pay

## 2019-06-01 ENCOUNTER — Encounter (HOSPITAL_BASED_OUTPATIENT_CLINIC_OR_DEPARTMENT_OTHER): Payer: Medicare Other | Admitting: Internal Medicine

## 2019-06-01 ENCOUNTER — Other Ambulatory Visit: Payer: Medicare Other

## 2019-06-01 ENCOUNTER — Encounter (HOSPITAL_BASED_OUTPATIENT_CLINIC_OR_DEPARTMENT_OTHER): Payer: Medicare Other | Attending: Internal Medicine | Admitting: Internal Medicine

## 2019-06-01 DIAGNOSIS — E11621 Type 2 diabetes mellitus with foot ulcer: Secondary | ICD-10-CM | POA: Insufficient documentation

## 2019-06-01 DIAGNOSIS — I10 Essential (primary) hypertension: Secondary | ICD-10-CM | POA: Insufficient documentation

## 2019-06-01 DIAGNOSIS — E11610 Type 2 diabetes mellitus with diabetic neuropathic arthropathy: Secondary | ICD-10-CM | POA: Diagnosis not present

## 2019-06-01 DIAGNOSIS — L97422 Non-pressure chronic ulcer of left heel and midfoot with fat layer exposed: Secondary | ICD-10-CM | POA: Insufficient documentation

## 2019-06-01 DIAGNOSIS — E1142 Type 2 diabetes mellitus with diabetic polyneuropathy: Secondary | ICD-10-CM | POA: Insufficient documentation

## 2019-06-01 DIAGNOSIS — L97522 Non-pressure chronic ulcer of other part of left foot with fat layer exposed: Secondary | ICD-10-CM | POA: Diagnosis not present

## 2019-06-02 ENCOUNTER — Ambulatory Visit (INDEPENDENT_AMBULATORY_CARE_PROVIDER_SITE_OTHER): Payer: Medicare Other | Admitting: Family Medicine

## 2019-06-02 ENCOUNTER — Other Ambulatory Visit: Payer: Self-pay

## 2019-06-02 ENCOUNTER — Other Ambulatory Visit: Payer: Self-pay | Admitting: Family Medicine

## 2019-06-02 ENCOUNTER — Encounter: Payer: Self-pay | Admitting: Family Medicine

## 2019-06-02 ENCOUNTER — Telehealth: Payer: Self-pay | Admitting: *Deleted

## 2019-06-02 VITALS — BP 128/80 | HR 72 | Wt 122.4 lb

## 2019-06-02 DIAGNOSIS — M069 Rheumatoid arthritis, unspecified: Secondary | ICD-10-CM | POA: Diagnosis not present

## 2019-06-02 DIAGNOSIS — Z23 Encounter for immunization: Secondary | ICD-10-CM | POA: Diagnosis not present

## 2019-06-02 DIAGNOSIS — N63 Unspecified lump in unspecified breast: Secondary | ICD-10-CM | POA: Diagnosis not present

## 2019-06-02 DIAGNOSIS — E1165 Type 2 diabetes mellitus with hyperglycemia: Secondary | ICD-10-CM

## 2019-06-02 DIAGNOSIS — E039 Hypothyroidism, unspecified: Secondary | ICD-10-CM

## 2019-06-02 DIAGNOSIS — R600 Localized edema: Secondary | ICD-10-CM

## 2019-06-02 DIAGNOSIS — I10 Essential (primary) hypertension: Secondary | ICD-10-CM

## 2019-06-02 DIAGNOSIS — F439 Reaction to severe stress, unspecified: Secondary | ICD-10-CM

## 2019-06-02 DIAGNOSIS — H539 Unspecified visual disturbance: Secondary | ICD-10-CM

## 2019-06-02 DIAGNOSIS — E1152 Type 2 diabetes mellitus with diabetic peripheral angiopathy with gangrene: Secondary | ICD-10-CM

## 2019-06-02 DIAGNOSIS — N6320 Unspecified lump in the left breast, unspecified quadrant: Secondary | ICD-10-CM

## 2019-06-02 LAB — POCT GLYCOSYLATED HEMOGLOBIN (HGB A1C): HbA1c, POC (controlled diabetic range): 5.9 % (ref 0.0–7.0)

## 2019-06-02 NOTE — Patient Instructions (Addendum)
Breast mass - getting diagnostic mammogram  Checking thyroid, diabetes test today Once I have these back I will be able to write a letter about your eye surgery  Will try to get you bedside commode and reclining chair  Referring to rheumatologist  Flu shot today  Follow up in 3 months, sooner if needed

## 2019-06-02 NOTE — Assessment & Plan Note (Signed)
Suspect nodules on her elbows are related to rheumatoid. Refer back to rheumatology for evaluation/management.

## 2019-06-02 NOTE — Telephone Encounter (Signed)
-----   Message from [name], MD sent at 06/02/2019  2:16 PM EDT ----- Richardean Sale, I ordered a bedside commode and recliner chair for this patient in epic. Can you contact [place] to get these to her? Thanks [place]

## 2019-06-02 NOTE — Assessment & Plan Note (Signed)
Chronic issue. Update TSH today, titrate levothyroxine pending TSH results.

## 2019-06-02 NOTE — Progress Notes (Addendum)
Date of Visit: 06/02/2019   HPI:  Patient presents today to discuss clearance for cataract surgery.  Cataract surgery - I had previously written a letter stating she has no barriers to surgery, but her surgeon put the procedure on hold as she told them she was being evaluated for a possible DVT. It sounds like her wound care physician has been managing poor wound healing in her feet, and was concerned for a possible clot given ongoing swelling in her LLE. She had a doppler done of her leg done on Monday, which was negative for a clot. Patient having ongoing issues with trouble seeing due to her cataracts and would very much like to have the surgery done.   Wounds - says her wound doctor has asked her to keep her legs propped up and stay off her feet as much as possible. She requests a bedside commode as well as a recliner chair so that she can keep her feet elevated/stay off her feet.   Rheumatoid arthritis - last saw rheumatology over a year ago and has not followed up. She does not know the name of the physician she saw last. Reports something was amiss with her insurance. Wants to know if she could go back to that same doctor. She has noticed nonpainful nodules on her forearms just distal to her elbows, nonpainful, present for over a year but enlarging. Not on any disease modifying therapies right now.   Diabetes - not on any diabetes medications currently. Check sugars only occasionally, last week it was 140. Due for A1c today.   Dyspnea - patient mentions a history of chronic intermittent dyspnea. I saw her for this back in July 2019 and we got an echo, which showed G1DD. She takes lasix due to her ongoing renal disease, is on 160mg  twice daily. Has good urine output on this regimen. Dyspnea occurs only sometimes. She does note she gets shortness of breath sometimes with laying back as well. Overall feels like her breathing is stable and not getting worse. Never smoker. Patient does not feel like  breathing bothers her enough to do more workup at this time.   L breast mass - at end of visit when I reminded patient she is past due for a screening mammogram she said she has noticed a mass in her L breast for the last 6 months. Daughter and niece have both had breast cancer. No nipple drainage. Patient says she has been trying to ignore the mass because she was afraid of what it might mean.   Stress - under increased stress lately due to her chronic feet wounds and frequent medical appts. Denies SI/HI. Contemplating therapy but does not want to do this right now.  ROS: See HPI.  [place]: history of hypothyroidism, hypertension, rhuematoid arthritis, type 2 diabetes, PAD, peripheral neuropathy  PHYSICAL EXAM: BP 128/80   Pulse 72   Wt 122 lb 6.4 oz (55.5 kg)   SpO2 99%   BMI 22.39 kg/m  Gen: no acute distress, pleasant, cooperative HEENT: normocephalic, atraumatic  Heart: regular rate and rhythm, no murmur Lungs: clear to auscultation bilaterally, normal work of breathing  Neuro: alert, speech normal. Appears stressed and forgetful. Breasts: exam done with patient seated in chair as she could not get into table. At the [place] position on L breast there is a 2-3 cm firm mass. L nipple without drainage, but does have tenderness/fullness in the NAC and also light pink skin changes on the opening of the nipple. R  breast normal without masses or skin changes. No axillary lymphadenopathy on either side. Ext: legs and feet wrapped. 1+ edema bilaterally.  ASSESSMENT/PLAN:  Health maintenance:  -diagnostic mammo ordered today due to L breast mass -will request eye exam records (Dr. Satira Sark) -flu shot given today -discuss colonoscopy at future visit - want to avoid stressing patient out with further health appts at this time  Left breast mass Mentioned by patient and confirmed today on exam. Highly concerning for malignancy, especially in light of nipple changes. Diagnostic mammogram ordered  and scheduled for 5 days from now.  HYPERTENSION, BENIGN SYSTEMIC At goal, just on lasix. Lungs clear. Patient reporting occasional dyspnea on exertion. Echo last year remarkable only for G1DD. Patient declines further evaluation at this time. Will continue lasix & have her follow up as needed.   Hypothyroidism Chronic issue. Update TSH today, titrate levothyroxine pending TSH results.  Type 2 diabetes mellitus (HCC) Off medications. Update A1c today.  Vision disturbance As cataract surgery is generally a low surgical risk procedure, I do not see a reason she cannot undergo surgery at this time. She does have ongoing health issues including poorly healing wounds on her feet, which her wound care doctor is addressing. LE doppler was neg for DVT two days ago. Her hypertension is well optimized. Assessing diabetic control today with her A1c. Provided this is well controlled, will write letter that I see no barriers to cataract surgery.  Rheumatoid arthritis ([place]) Suspect nodules on her elbows are related to rheumatoid. Refer back to rheumatology for evaluation/management.  Stress Stressed by ongoing health issues, and with concern about possible new breast cancer. Offered support to patient. She will let me know if she wants to pursue counseling.  Need for DME - reasonable to request recliner and bedside commode. Will order through EMR and ask nursing staff to contact [place].  FOLLOW UP: Follow up in 3 mos for above issues Referring to rheumatology  [place] J. [name], [place]

## 2019-06-02 NOTE — Assessment & Plan Note (Signed)
Stressed by ongoing health issues, and with concern about possible new breast cancer. Offered support to patient. She will let me know if she wants to pursue counseling.

## 2019-06-02 NOTE — Addendum Note (Signed)
Addended by: [name] on: 06/02/2019 02:16 PM   Modules accepted: Orders

## 2019-06-02 NOTE — Assessment & Plan Note (Signed)
As cataract surgery is generally a low surgical risk procedure, I do not see a reason she cannot undergo surgery at this time. She does have ongoing health issues including poorly healing wounds on her feet, which her wound care doctor is addressing. LE doppler was neg for DVT two days ago. Her hypertension is well optimized. Assessing diabetic control today with her A1c. Provided this is well controlled, will write letter that I see no barriers to cataract surgery.

## 2019-06-02 NOTE — Assessment & Plan Note (Addendum)
At goal, just on lasix. Lungs clear. Patient reporting occasional dyspnea on exertion. Echo last year remarkable only for G1DD. Patient declines further evaluation at this time. Will continue lasix & have her follow up as needed.

## 2019-06-02 NOTE — Telephone Encounter (Signed)
Community message sent to [employer] and Darlina Guys @ [facility] to process DME order for Bedside commode and recliner chair.   Christen Bame, CMA

## 2019-06-02 NOTE — Assessment & Plan Note (Signed)
Off medications. Update A1c today.

## 2019-06-02 NOTE — Assessment & Plan Note (Signed)
Mentioned by patient and confirmed today on exam. Highly concerning for malignancy, especially in light of nipple changes. Diagnostic mammogram ordered and scheduled for 5 days from now.

## 2019-06-03 DIAGNOSIS — Z749 Problem related to care provider dependency, unspecified: Secondary | ICD-10-CM | POA: Diagnosis not present

## 2019-06-03 DIAGNOSIS — M069 Rheumatoid arthritis, unspecified: Secondary | ICD-10-CM | POA: Diagnosis not present

## 2019-06-03 LAB — SPECIMEN STATUS

## 2019-06-03 LAB — TSH: TSH: 11.2 u[IU]/mL — ABNORMAL HIGH (ref 0.450–4.500)

## 2019-06-04 ENCOUNTER — Other Ambulatory Visit: Payer: Self-pay

## 2019-06-04 ENCOUNTER — Encounter (HOSPITAL_COMMUNITY)
Admission: RE | Admit: 2019-06-04 | Discharge: 2019-06-04 | Disposition: A | Discharge status: Home / Self Care | Payer: Medicare Other | Source: Ambulatory Visit | Attending: Nephrology | Admitting: Nephrology

## 2019-06-04 VITALS — BP 161/74 | HR 69 | Temp 97.2°F | Resp 20

## 2019-06-04 DIAGNOSIS — E039 Hypothyroidism, unspecified: Secondary | ICD-10-CM | POA: Diagnosis not present

## 2019-06-04 DIAGNOSIS — N189 Chronic kidney disease, unspecified: Secondary | ICD-10-CM | POA: Diagnosis not present

## 2019-06-04 DIAGNOSIS — D631 Anemia in chronic kidney disease: Secondary | ICD-10-CM | POA: Insufficient documentation

## 2019-06-04 DIAGNOSIS — E1142 Type 2 diabetes mellitus with diabetic polyneuropathy: Secondary | ICD-10-CM | POA: Diagnosis not present

## 2019-06-04 DIAGNOSIS — L97421 Non-pressure chronic ulcer of left heel and midfoot limited to breakdown of skin: Secondary | ICD-10-CM | POA: Diagnosis not present

## 2019-06-04 DIAGNOSIS — I10 Essential (primary) hypertension: Secondary | ICD-10-CM | POA: Diagnosis not present

## 2019-06-04 DIAGNOSIS — D508 Other iron deficiency anemias: Secondary | ICD-10-CM | POA: Insufficient documentation

## 2019-06-04 DIAGNOSIS — E11621 Type 2 diabetes mellitus with foot ulcer: Secondary | ICD-10-CM | POA: Diagnosis not present

## 2019-06-04 LAB — POCT HEMOGLOBIN-HEMACUE: Hemoglobin: 11.3 g/dL — ABNORMAL LOW (ref 12.0–15.0)

## 2019-06-04 MED ORDER — EPOETIN ALFA-EPBX 40000 UNIT/ML IJ SOLN
40000.0000 [IU] | INTRAMUSCULAR | Status: DC
  Administered 2019-06-04: 10:00:00 40000 [IU] via SUBCUTANEOUS
  Filled 2019-06-04: qty 1

## 2019-06-04 NOTE — Telephone Encounter (Signed)
Order received and is being processed now. Christen Bame, CMA

## 2019-06-07 ENCOUNTER — Other Ambulatory Visit: Payer: Medicare Other

## 2019-06-07 ENCOUNTER — Telehealth: Payer: Self-pay | Admitting: Family Medicine

## 2019-06-07 DIAGNOSIS — E039 Hypothyroidism, unspecified: Secondary | ICD-10-CM

## 2019-06-07 NOTE — Telephone Encounter (Signed)
The patient had a virtual visit last week and clearance for cataract surgery was supposed to be faxed at that time but they have not received it.  Please fax this to [facility] , Dr. Marygrace Drought, # [phone].

## 2019-06-08 ENCOUNTER — Other Ambulatory Visit: Payer: Self-pay

## 2019-06-08 ENCOUNTER — Encounter (HOSPITAL_BASED_OUTPATIENT_CLINIC_OR_DEPARTMENT_OTHER): Payer: Medicare Other | Admitting: Internal Medicine

## 2019-06-08 DIAGNOSIS — L97422 Non-pressure chronic ulcer of left heel and midfoot with fat layer exposed: Secondary | ICD-10-CM | POA: Diagnosis not present

## 2019-06-08 DIAGNOSIS — I10 Essential (primary) hypertension: Secondary | ICD-10-CM | POA: Diagnosis not present

## 2019-06-08 DIAGNOSIS — E11610 Type 2 diabetes mellitus with diabetic neuropathic arthropathy: Secondary | ICD-10-CM | POA: Diagnosis not present

## 2019-06-08 DIAGNOSIS — E1142 Type 2 diabetes mellitus with diabetic polyneuropathy: Secondary | ICD-10-CM | POA: Diagnosis not present

## 2019-06-08 DIAGNOSIS — E11621 Type 2 diabetes mellitus with foot ulcer: Secondary | ICD-10-CM | POA: Diagnosis not present

## 2019-06-08 DIAGNOSIS — L97522 Non-pressure chronic ulcer of other part of left foot with fat layer exposed: Secondary | ICD-10-CM | POA: Diagnosis not present

## 2019-06-10 DIAGNOSIS — I10 Essential (primary) hypertension: Secondary | ICD-10-CM | POA: Diagnosis not present

## 2019-06-10 DIAGNOSIS — L97421 Non-pressure chronic ulcer of left heel and midfoot limited to breakdown of skin: Secondary | ICD-10-CM | POA: Diagnosis not present

## 2019-06-10 DIAGNOSIS — E039 Hypothyroidism, unspecified: Secondary | ICD-10-CM | POA: Diagnosis not present

## 2019-06-10 DIAGNOSIS — E1142 Type 2 diabetes mellitus with diabetic polyneuropathy: Secondary | ICD-10-CM | POA: Diagnosis not present

## 2019-06-10 DIAGNOSIS — E11621 Type 2 diabetes mellitus with foot ulcer: Secondary | ICD-10-CM | POA: Diagnosis not present

## 2019-06-14 ENCOUNTER — Encounter (HOSPITAL_BASED_OUTPATIENT_CLINIC_OR_DEPARTMENT_OTHER): Payer: Medicare Other | Admitting: Internal Medicine

## 2019-06-14 ENCOUNTER — Other Ambulatory Visit: Payer: Self-pay

## 2019-06-14 DIAGNOSIS — I872 Venous insufficiency (chronic) (peripheral): Secondary | ICD-10-CM | POA: Diagnosis not present

## 2019-06-14 DIAGNOSIS — E1142 Type 2 diabetes mellitus with diabetic polyneuropathy: Secondary | ICD-10-CM | POA: Diagnosis not present

## 2019-06-14 DIAGNOSIS — I10 Essential (primary) hypertension: Secondary | ICD-10-CM | POA: Diagnosis not present

## 2019-06-14 DIAGNOSIS — L97529 Non-pressure chronic ulcer of other part of left foot with unspecified severity: Secondary | ICD-10-CM | POA: Diagnosis not present

## 2019-06-14 DIAGNOSIS — M069 Rheumatoid arthritis, unspecified: Secondary | ICD-10-CM | POA: Diagnosis not present

## 2019-06-14 DIAGNOSIS — L97522 Non-pressure chronic ulcer of other part of left foot with fat layer exposed: Secondary | ICD-10-CM | POA: Diagnosis not present

## 2019-06-14 DIAGNOSIS — E11621 Type 2 diabetes mellitus with foot ulcer: Secondary | ICD-10-CM | POA: Diagnosis not present

## 2019-06-14 NOTE — Progress Notes (Signed)
[name], [name] ([id number]) Visit Report for 06/14/2019 HPI Details Patient Name: Date of Service: [name], [name] 06/14/2019 2:15 PM Medical Record [id number] Patient Account Number: [id number] Date of Birth/Sex: Treating RN: [birth year] (73 y.o. [name] Primary Care Provider: Chrisandra Keller Other Clinician: Referring Provider: Treating Provider/Extender:Robson, Nonda Lou, Ivan Anchors in Treatment: 11 History of Present Illness HPI Description: 73 year old patient who is known to our practice from May of last year was fully worked up with a venous and arterial duplex study and was referred to the vascular surgeon for follow-up. The patient says she has seen the vascular surgeons and has an appointment back in April. No procedure was done. She has developed a blister on the left big toe and forefoot which has been fairly large and draining fluid and she self-referred herself to [place]. The patient was recently seen in the ER on 10/06/2016, and was treated with clindamycin and asked to see the podiatrist and PCP. She saw the podiatrist Dr. Felisa Bonier on 10/11/2016 who I understand did an x-ray but no report is available. Of note last year when I saw her, I had referred her to do Dr. Ruta Hinds -- he saw on 02/04/2016. he reviewed and noted that she had evidence of peripheral arterial disease and recommended a follow-up in 6 months for repeat noninvasive arterial exam. Wounds worsened he would consider an angiogram. He saw her back on 03/14/2016, and continued conservative treatment and asked her to come back for noninvasive studies in 6 months. 12/03/2016 -- was seen in the office on 11/28/2016 and note is made of the fact that venous duplex examination on 01/02/2016 showed no evidence of reflux. Did have evidence of peripheral arterial disease and was asked to follow- up in 6 months time. Recent data on 11/28/2016 showed right noncompressible with monophasic  waveforms left noncompressible with monophasic waveforms. The thought was she has medial calcifications of the arteries and had bilateral toe brachial indices which were normal. Right toe brachial index was 0.74 and the left toe brachial index was 0.80 She would return for a vascular study in 6 months when her ABI would be checked again. 7/6-Patient is back at 1 week visit to the clinic, home health is attending to the wound once a week, we have been using silver alginate, MRI scheduled this week on Thursday for the left foot. We are seeing her for the left foot plantar ulcer and the right great toe ulcer 12/24/2016 -- the left plantar heel had a large callus which came off during her evaluation and there was an open ulcer at the base ==== Old notes She was recently seen by her PCP for an ulcer of the left toe which was treated at the urgent care by giving her ciprofloxacin which she has been taking. She has uncontrolled diabetes and A1c was 13.7 done last week. Past medical history significant for poorly controlled diabetes mellitus type 2, obesity, osteopenia, GERD, diabetic retinopathy, diabetic neuropathy, reported arthritis, hypothyroidism, hypertension. No x-rays were done recently. 12/27/2015 -- had an x-ray of the left foot --IMPRESSION:Juxta-articular erosions at the IP joint great toe and diffusely in the MTP joints greatest at first, suggesting an inflammatory arthropathy such as rheumatoid arthritis or gout.While septic arthritis can cause juxta-articular erosions, the multiplicity of joints involved makes this less likely. Significant soft tissue swelling with single tiny questionable focus of soft tissue gas medial to the first metatarsal head. If patient has persistent symptoms or persistent clinical concern for osteomyelitis, recommend MR  imaging of the LEFT foot (with and without contrast if renal function permits). 01/03/2016 -- the lower extremity venous duplex reflex  evaluation showed no evidence of deep or superficial thrombus or reflux. The arterial study done and showed the resting right ABI is greater than 1.3 and the left ABI was greater than 1.3 indicating arterial medial calcification. The right TBI was less than 0.64 and the left ABI was less than 0.64 both of which are abnormal. except for the left posterior tibial which is triphasic on the flow is biphasic. With the above results due to the fact that she has diabetes mellitus and would recommend she sees the vascular surgeon to see if any further intervention or procedure needs to be planned. ====== READMISSION 09/19/17;this is a 73 year old woman that we've had in our clinic previously for wounds on predominantly her left foot left toe and left heel. She is a diabetic. She has had lower extremity workups for both venous reflux disease and arterial disease. She has known noncompressible vessels bilaterally however in May 2017 as noted above her TBI was 0.64 and the left TBI was less than 0.64. When she was last in clinicin May 2017 the left heel wound had closed. She was faithfully wearing her juxta light stockings. She tells me 2-3 weeks ago she developed increasing edema in her legs. She was seen by Dr. Valentino Saxon of nephrology who apparently increased her Lasix from 40-80 mg twice a day. The patient has not noticed any improvement in the edema. She does not weigh herself. About a week ago she noted blisters on her legs on the right 2 on the left 1 since then she has not been wearing the juxta light stockings. She has not had any pain no shortness of breath 09/26/17;; the patient has a stable wound as a result of blistering on the left anterior calf and 2 on the right anterior and right medial. There is another denuded blister on the left anterior more superior. This does not have any current fluid I did not remove the skin today. We've been using silver alginate under compression 10/03/17; the  patient's right leg is healed. The area on the left anterior leg all wound sites look better. We've been using silver alginate under compression. In questioning the patient apparently has a juxta light stockings however she has not been using these. We asked her to find them lubricate her skin every night and used a juxta light stocking on the right. I would like to see this next time 10/10/17; both the patient's areas on the left anterior leg 2 are closed the area on the look right posterior medial calf remains closed. She is been using a juxta light stocking on the right she has one for the left Readmission [name] is now a 73 year old woman we have had in this clinic at least 3 times before. Most recently she was here in February 2019 for 2 visits with wounds on her bilateral lower extremities because of blisters these healed fairly quickly and we discharged her in juxta lite stockings. She was previously here in 2018 with a wound on her left foot and in 2017 she was sent to see Dr. Oneida Alar for follow-up of PAD. Indeed she seems to have had follow-up noninvasive vascular studies every 6 months. Her current problems began at the end of October she apparently suffered a fall with a laceration on her right anterior leg. She had this sutured on 06/23/2018. Sometime in the same timeframe she  developed blistering over a large area of the left anterior calf, the right great toe. The blisters have spontaneously ruptured and she has large areas of the epithelial loss. She also has non-open blisters on the medial part of the left great toe. There is also an open area on the right second toe. I do not believe that she was using her compression stockings [not juxta lites] because the swelling in her legs had gotten too large for her to wrap. She tells me she is recently been to her nephrologist and had her Lasix increased which is helped somewhat with the swelling. The patient did not have arterial  studies done in our clinic. Her most recent noninvasive studies were in August. These showed ABIs noncompressible bilaterally. She had TBI's on the right at 0.54 and on the left at 0.60 biphasic waveforms at the PTA and DP bilaterally. She was felt to have bilateral ABIs that were unchanged from her previous study in October 2018. However it was noted her TBI's were decreased. Past medical history includes type 2 diabetes with peripheral neuropathy with a recent hemoglobin A1c of 7. She is apparently off her diabetes medications, she has stage III chronic renal failure, hyperlipidemia, hypertension, cataracts, retinopathy, rheumatoid arthritis, iron deficiency and apparently is receiving IV iron, urge incontinence 07/10/2018; she arrives today with everything looking quite a bit better. The large area of blistered denuded epithelium on the left calf has healed over. The area on the right lateral tibial area is also mostly healed still with a linear open area where her laceration was. The area on her left medial toe is just about healed. She has a dark thick black eschar over the tip of her right first toe. I do not remember this from last week this feels almost like the eschar you would see with ischemia yet her toes are warm and her peripheral pulses are palpable. She does have some degree of PAD but I do not think that was sufficient enough to have caused this. 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 Patient originally came here with a laceration injury of her right lateral mid calf. She also had an area of denuded skin on the left which is since closed over. She had a small area on the tip of her right great toe as well. She arrives today with what looks to be dry gangrene on the right great toe as well as discoloration of the tips of her second and third toes. She is not in any pain. She also had purulent drainage coming out open area on her left  midfoot which was new this week. Culture was obtained The patient is known to vascular surgery having last been seen on 04/02/2018. They follow her for chronic arterial insufficiency. Last arterial studies were in August 2017 at which time her ABIs were noncompressible however her TBI was 0.54 on the right and 0.60 on the left. She was noted to have bilateral ankle arteries remain calcified. Waveforms biphasic. Slight decline in bilateral TBI's 07/24/2018; the patient was graciously seen urgently by vein and vascular with regards to critical limb ischemia, she had a CO2 angiogram and then contrast to look at the lower extremity arteries. Surprisingly no significant vascular disease was noted in the aortic iliac set segment, patent common femoral and profunda arteries patent popliteal and three-vessel runoff with all 3 of the tibials present. Her dorsalis pedis and plantar arch vessels also filled. Notable for the  fact that it was felt that she had small vessel disease near her toes but this was not amenable for intervention. The abscess that I unroofed last week on the plantar left foot grew Stenotrophomonas Maltophilia. This is not a usual true wound pathogen. However it had abundant organisms and given the fact that is an abscess I went ahead and treated this with Levaquin 250 daily for 7 days. I had her on doxycycline last week 07/31/2018; I put 3 layer compression on the right leg last week. She arrives in today with almost no edema in the right leg and out of concern for the this in the gangrenous toes I am going to reduce this back to Kerlix and Coban which is what we had her on at the beginning. We are using silver alginate to the traumatic wound on her right anterior leg and this looks a lot better. The area on the left foot is healed and the patient is using her own wraparound compression stocking here. She still has the dry gangrene-like changes predominantly involving the right first  toe, tip of her right second and third toes. We are using Betadine here. The exact etiology of this was not completely clear 08/07/18; we've been using 2 layer compression on the right leg. She still has the gangrenous toes for second and third on the right and the original laceration injury. She does not have any significant vascular issues to the level of the ankle. She does have small vessel disease in her toes.The left mid foot wound is closed 08/14/18; the patient arrives in clinic today with improvement in the original area on her right lateral leg. The 3 dry gangrenous toes first second and third on the right all look stable except for the second. This was obviously separating. It was removed fortunately there is healthy-looking surface tissue underneath this. She arrives in clinic with a reopening on the left midfoot. This was previously a small abscess that had closed. She has Charcot feet bilaterally 08/24/2018; right lateral leg wound is smaller. The 3 dry gangrenous first second and third toes are a bit changed. The third toe was separating I remove the eschar and subcutaneous tissue to reveal a small open area at the tip of the toe just at the base at the head of the nail. The first toe is unchanged. Left plantar foot is larger. This is in the Charcot foot area. I removed nonviable circumference and surface from the wound using silver alginate 08/31/2018; right lateral leg wound continues to contract also the area on her plantar foot. First great toe is beginning to separate and the nail will soon I think fall off.. She sees vein and vascular tomorrow. 1/20; right lateral leg wound continues to contract and is almost closed first great toe is beginning to separate. The area on her left plantar foot still has rolled edges around the wound and some debris in the surface but generally smaller. We have been using silver alginate to the 2 remaining wound areas and Betadine to the left great  toe. The patient saw vein and vascular on 1/14. They did not add anything here. Noted that the patient has microvascular disease but no major macrovascular issues. No intervention was performed during her CO2 aortogram on 07/23/2018. 2/3; 2-week follow-up. Right leg wound which is her original wound and coming here is healed. Second and third right toes have healed. She still has dry eschared/dry gangrene over the right first toe. I thought this might begin  to separate but it really is not making great headway with that. The area on the left midfoot reopened 2 to 3 weeks ago and this is made no progress. Finally she has new blisters on the left first toe laterally and the left second toe dorsally over the DIP at the base of the nail 2/17; 2-week follow-up. Right leg wound which is her original wound is healed. Right second and third toes are still healed. She still has the black ischemic eschar over the first toe which has not really begun to separate. The area on the left midfoot is unfortunately worse with large amounts of denuded skin around this. She has blisters on the medial part of her first toe and the dorsal part of the second toe on the left 2/24; she still has the black ischemic eschar over the first toe which is not really separating. The area on the left midfoot seems stable from last week. The blisters on the medial part of her first toe and dorsal second toe had hardened and I removed both of these there are small open areas underneath. 3/2; left first and second toes are healed. The area on the left plantar foot looks better. Finally the right great toe area is beginning to separate. 3/9; left first and second toes remain healed. Left plantar foot continues to look excellent smaller with a healthy base. Finally the right great toe had separated further. I went ahead and remove the eschar here. There is still a fairly sizable wound here but this will give [place] a better chance to  address this. 3/16-Left first and second toes mostly healed although the left first toe appears to have an open area Right great toe deep ulcer with exposed bone, greenish debris with necrotic tissue, right second toe wound with clean edges and base. Patient had silver alginate dressing to all the toes especially on the right. SHe has been offloading with inserts and open toe shoes 3/23; since I last saw this patient 2 weeks ago there is been quite a bit of deterioration. The left plantar foot continues to look about the same. She now has an area on the left lateral first toe. On the right foot the first toe has a fair amount of exposed bone. X-ray that we did last week did not show osteomyelitis nevertheless that would have to be a concern. She also has an area on the tip of the right second toe that is new 3/30; some improvement in the left plantar foot. Bone I took out of the right first toe last week did not show osteomyelitis under pathology however culture did grow rare staph aureus and rare Citrobacter. We have been using silver alginate to all her wounds 4/6; left plantar foot wound about the same although this does not look ominous. The right first toe which is been very problematic does not have exposed bone aggressive debridement of the surface of this. She does have grams of epithelialization. We have been using silver alginate on both wounds 4/13; left plantar foot wound about the same. Removed skin and subcutaneous tissue from the wound circumference. The right great toe has underlying osteomyelitis I have extended her Levaquin 500 mg for another 2 weeks. She does not complain of any overt side effects. 4/23;. The patient has wounds on the left plantar foot, left lateral great toe and a new area on the dorsal part of the right second toe. Her large area over the tip of her right great toe.  She is on Levaquin I will need to check on when we want to finish this. This is for  underlying osteomyelitis of the right great toe 5/4; the patient has or should be completing her Levaquin at this point. This is for osteomyelitis in the right great toe. She also has wounds on her left plantar foot and the dorsal part of her right second toe. We have been using silver alginate 5/11; patient arrives today with her right great toe generally looking better. However the area on the plantar left foot had considerable undermining and discomfort around the wound and some erythema. We have been using silver alginate 5/18-Patient returns at 1 week the right great toe is much more macerated around the edges of the wound, the left foot wounds are considerably better especially the left great toe and left second toe. We have been using silver alginate to all wounds and juxta lite to the right leg and 2 layer compression to the left. THere is now a fluid filled bleb on the posterior calf of RLE 5/26; right great toe continues to improve. The underlying osteomyelitis was treated empirically in this clinic. Left midfoot wound also looks better than the last time I saw this 2 weeks ago. Apparently last week she had a blister on the right posterior calf that was largely. This was not specifically addressed. It is open since last time she has a large area on the right posterior mid calf with skin attached to a large amount of the circumference. 6/1; we continue to have true improvements in both of the wound areas on the right great toe and left plantar foot. The large blister on the posterior right calf is just about fully epithelialized although there is still some weeping edema in this area 6/8; we continued to have been improvements in both areas currently on the right great toe and left plantar foot. The large blister on the posterior right calf is fully healed. 6/15; we we continue to have improvements in the right great toe. The left plantar foot which was a small wound without  undermining last week has decided to expand into the lateral foot. There is some swelling in this area. She is not systemically unwell 6/22; the culture of her left foot that I did last week grew Enterobacter I had her on doxycycline. There was also clearly some purulent drainage coming from this today through the original wound with the swelling on the lateral part of her foot. She is not systemically unwell. 6/25 the patient is on Levaquin for the Enterobacter cultured out of her left foot. She now has an extensive wound area in the medial left foot we are using silver alginate The patient complains of pain in the foot going up towards the metatarsal head. There is also swelling inferiorly to this area that we identified last week but this is not tender 6/29; the patient is completing her Levaquin for the Enterobacter. We are using silver alginate to the extensive wound on the left plantar foot as well as to the right great toe 7/14; the right great toe is healed today. The area on the left foot looks better. The MRI of the left foot did not show osteomyelitis in the area of the wound however it did show an effusion in the tibiotalar joint. The patient has rheumatoid arthritis 7/20; right great toe remains healed. The area on the left foot continues to look improved. 7/27 right great toe remains healed. The area on the  left foot continues to contract. Healthy granulation tissue she is only offloading this in surgical shoes. 03/24/2019 on evaluation today patient appears to be doing very well with regard to her plantar foot ulcer. She has been tolerating the dressing changes without complication. She has a healthy granulation surface. There is no signs of active infection at this time and her swelling seems to be under good control. 8/17; the areas on the left medial midfoot. Probably subluxed bone in this area. Wounds are superficial. It looks as though there was some blistering skin at one  point 8/24; the area on the left medial midfoot. This is completely resolved at this point although it still looks somewhat vulnerable. She probably has subluxed bone underneath this area. She has pes planus deformity 9/14; apparently this wound reopened in the 3 weeks since I saw her. She was seen last week she is given new healing sandals. Been using calcium alginate with felt offloading. This is not surprising given the area looks somewhat vulnerable. 9/28; left plantar foot in the setting of diabetic neuropathy probably some degree of Charcot deformity. She also has abrasions on the left leg which she says are due to the Curlex co-band that was put on by home health. She is also having a lot of pain in the left calf finds the wrap too tight 10/5; left plantar foot has 2 superficial open areas. The abrasions on the left leg from last week are healed. She still has some swelling and tenderness however. Duplex ultrasound of the leg tomorrow tomorrow. 10/13; patient arrived with a deterioration in the left foot from last week. Undermining wound probably connecting the 2 superficial areas from last week. She wears a surgical shoe with felt offloading. Duplex ultrasound she had last week showed a ruptured Baker's cyst. Her leg looks a lot better 10/20; 2 remanent open areas in the left foot. This looks better than last time. We are using silver alginate. She wears a surgical shoe for offloading there is not a more aggressive option 10/26; the patient does not have any open on the left foot we are using silver alginate to the medial aspect of the foot. She wears a surgical shoe on both feet. Juxta lite stocking on the right, she has 1 for the left. This is been a long haul for this patient. Initially coming in with a wound on her right lateral calf. Then her right great toe. More recently areas on the left medial foot Electronic Signature(s) Signed: 06/14/2019 5:48:14 PM By: Linton Ham  MD Entered By: Linton Ham on 06/14/2019 15:58:46 -------------------------------------------------------------------------------- Physical Exam Details Patient Name: Date of Service: [name] 06/14/2019 2:15 PM Medical Record [id number] Patient Account Number: [id number] Date of Birth/Sex: Treating RN: 1945/10/11 (73 y.o. [name] Primary Care Provider: Chrisandra Keller Other Clinician: Referring Provider: Treating Provider/Extender:Robson, Nonda Lou, Ivan Anchors in Treatment: 49 Constitutional Sitting or standing Blood Pressure is within target range for patient.. Pulse regular and within target range for patient.Marland Kitchen Respirations regular, non-labored and within target range.. Temperature is normal and within the target range for the patient.Marland Kitchen Appears in no distress. Eyes Conjunctivae clear. No discharge.no icterus. Respiratory work of breathing is normal. Cardiovascular It will pulses are palpable. Musculoskeletal Charcot deformity in both feet. On the left foot there is a subluxed cuneiform, not sure which 1. Integumentary (Hair, Skin) Changes of chronic venous insufficiency in both legs. Psychiatric appears at normal baseline. Notes Exam; nothing is open on the right leg or foot.  The left leg is no open areas. Left foot this week has no open areas the 2 superficial areas have closed over. There is no evidence of surrounding infection. Electronic Signature(s) Signed: 06/14/2019 5:48:14 PM By: Linton Ham MD Entered By: Linton Ham on 06/14/2019 16:00:07 -------------------------------------------------------------------------------- Physician Orders Details Patient Name: Date of Service: [name] 06/14/2019 2:15 PM Medical Record [id number] Patient Account Number: [id number] Date of Birth/Sex: Treating RN: [birth year] (73 y.o. [name] Primary Care Provider: Chrisandra Keller Other Clinician: Referring  Provider: Treating Provider/Extender:Robson, Nonda Lou, Ivan Anchors in Treatment: 59 Verbal / Phone Orders: No Diagnosis Coding ICD-10 Coding Code Description E11.42 Type 2 diabetes mellitus with diabetic polyneuropathy L97.518 Non-pressure chronic ulcer of other part of right foot with other specified severity L97.521 Non-pressure chronic ulcer of other part of left foot limited to breakdown of skin L02.612 Cutaneous abscess of left foot S80.812D Abrasion, left lower leg, subsequent encounter Follow-up Appointments Return Appointment in 2 weeks. Dressing Change Frequency Wound #19 Left,Plantar Foot Other: - Change 2 times per week Skin Barriers/Peri-Wound Care Wound #19 Left,Plantar Foot Moisturizing lotion - to both legs Wound Cleansing Wound #19 Left,Plantar Foot Clean wound with Wound Cleanser Primary Wound Dressing Wound #19 Left,Plantar Foot Foam - for protection Edema Control Wound #19 Left,Plantar Foot Avoid standing for long periods of time Elevate legs to the level of the heart or above for 30 minutes daily and/or when sitting, a frequency of: - throughout the day Support Garment 20-30 mm/Hg pressure to: - juxtalite to both legs daily Off-Loading Open toe surgical shoe to: - left foot with felt. [facility] for wound care. Lajean Manes Electronic Signature(s) Signed: 06/14/2019 5:48:14 PM By: Linton Ham MD Signed: 06/14/2019 6:04:10 PM By: Levan Hurst RN, BSN Entered By: Levan Hurst on 06/14/2019 15:42:03 -------------------------------------------------------------------------------- Problem List Details Patient Name: Date of Service: [name] 06/14/2019 2:15 PM Medical Record [id number] Patient Account Number: [id number] Date of Birth/Sex: Treating RN: [birth year] (73 y.o. [name] Primary Care Provider: Chrisandra Keller Other Clinician: Referring Provider: Treating  Provider/Extender:Robson, Nonda Lou, Ivan Anchors in Treatment: 49 Active Problems ICD-10 Evaluated Encounter Code Description Active Date Today Diagnosis E11.42 Type 2 diabetes mellitus with diabetic polyneuropathy 07/03/2018 No Yes L97.518 Non-pressure chronic ulcer of other part of right foot 07/20/2018 No Yes with other specified severity L97.521 Non-pressure chronic ulcer of other part of left foot 08/15/2018 No Yes limited to breakdown of skin L02.612 Cutaneous abscess of left foot 07/20/2018 No Yes S80.812D Abrasion, left lower leg, subsequent encounter 05/17/2019 No Yes Inactive Problems ICD-10 Code Description Active Date Inactive Date L97.511 Non-pressure chronic ulcer of other part of right foot limited to 07/03/2018 07/03/2018 breakdown of skin E11.52 Type 2 diabetes mellitus with diabetic peripheral angiopathy 07/20/2018 07/20/2018 with gangrene Resolved Problems ICD-10 Code Description Active Date Resolved Date S81.811D Laceration without foreign body, right lower leg, subsequent 07/03/2018 07/03/2018 encounter L97.821 Non-pressure chronic ulcer of other part of left lower leg limited 07/03/2018 07/03/2018 to breakdown of skin L97.211 Non-pressure chronic ulcer of right calf limited to breakdown of 01/12/2019 01/12/2019 skin Electronic Signature(s) Signed: 06/14/2019 5:48:14 PM By: Linton Ham MD Entered By: Linton Ham on 06/14/2019 15:57:09 -------------------------------------------------------------------------------- Progress Note Details Patient Name: Date of Service: [name] 06/14/2019 2:15 PM Medical Record [id number] Patient Account Number: [id number] Date of Birth/Sex: Treating RN: 16-May-1946 (73 y.o. [name] Primary Care Provider: Chrisandra Keller Other Clinician: Referring Provider: Treating Provider/Extender:Robson, Nonda Lou, Ivan Anchors in Treatment:  49 Subjective History of Present Illness  (HPI) 73 year old patient who is known to our practice from May of last year was fully worked up with a venous and arterial duplex study and was referred to the vascular surgeon for follow-up. The patient says she has seen the vascular surgeons and has an appointment back in April. No procedure was done. She has developed a blister on the left big toe and forefoot which has been fairly large and draining fluid and she self-referred herself to [place]. The patient was recently seen in the ER on 10/06/2016, and was treated with clindamycin and asked to see the podiatrist and PCP. She saw the podiatrist Dr. Felisa Bonier on 10/11/2016 who I understand did an x-ray but no report is available. Of note last year when I saw her, I had referred her to do Dr. Ruta Hinds -- he saw on 02/04/2016. he reviewed and noted that she had evidence of peripheral arterial disease and recommended a follow-up in 6 months for repeat noninvasive arterial exam. Wounds worsened he would consider an angiogram. He saw her back on 03/14/2016, and continued conservative treatment and asked her to come back for noninvasive studies in 6 months. 12/03/2016 -- was seen in the office on 11/28/2016 and note is made of the fact that venous duplex examination on 01/02/2016 showed no evidence of reflux. Did have evidence of peripheral arterial disease and was asked to follow- up in 6 months time. Recent data on 11/28/2016 showed right noncompressible with monophasic waveforms left noncompressible with monophasic waveforms. The thought was she has medial calcifications of the arteries and had bilateral toe brachial indices which were normal. Right toe brachial index was 0.74 and the left toe brachial index was 0.80 She would return for a vascular study in 6 months when her ABI would be checked again. 7/6-Patient is back at 1 week visit to the clinic, home health is attending to the wound once a week, we have been using silver alginate, MRI  scheduled this week on Thursday for the left foot. We are seeing her for the left foot plantar ulcer and the right great toe ulcer 12/24/2016 -- the left plantar heel had a large callus which came off during her evaluation and there was an open ulcer at the base ==== Old notes She was recently seen by her PCP for an ulcer of the left toe which was treated at the urgent care by giving her ciprofloxacin which she has been taking. She has uncontrolled diabetes and A1c was 13.7 done last week. Past medical history significant for poorly controlled diabetes mellitus type 2, obesity, osteopenia, GERD, diabetic retinopathy, diabetic neuropathy, reported arthritis, hypothyroidism, hypertension. No x-rays were done recently. 12/27/2015 -- had an x-ray of the left foot --IMPRESSION:Juxta-articular erosions at the IP joint great toe and diffusely in the MTP joints greatest at first, suggesting an inflammatory arthropathy such as rheumatoid arthritis or gout.While septic arthritis can cause juxta-articular erosions, the multiplicity of joints involved makes this less likely. Significant soft tissue swelling with single tiny questionable focus of soft tissue gas medial to the first metatarsal head. If patient has persistent symptoms or persistent clinical concern for osteomyelitis, recommend MR imaging of the LEFT foot (with and without contrast if renal function permits). 01/03/2016 -- the lower extremity venous duplex reflex evaluation showed no evidence of deep or superficial thrombus or reflux. The arterial study done and showed the resting right ABI is greater than 1.3 and the left ABI was greater than 1.3 indicating  arterial medial calcification. The right TBI was less than 0.64 and the left ABI was less than 0.64 both of which are abnormal. except for the left posterior tibial which is triphasic on the flow is biphasic. With the above results due to the fact that she has diabetes mellitus and would  recommend she sees the vascular surgeon to see if any further intervention or procedure needs to be planned. ====== READMISSION 09/19/17;this is a 73 year old woman that we've had in our clinic previously for wounds on predominantly her left foot left toe and left heel. She is a diabetic. She has had lower extremity workups for both venous reflux disease and arterial disease. She has known noncompressible vessels bilaterally however in May 2017 as noted above her TBI was 0.64 and the left TBI was less than 0.64. When she was last in clinicin May 2017 the left heel wound had closed. She was faithfully wearing her juxta light stockings. She tells me 2-3 weeks ago she developed increasing edema in her legs. She was seen by Dr. Valentino Saxon of nephrology who apparently increased her Lasix from 40-80 mg twice a day. The patient has not noticed any improvement in the edema. She does not weigh herself. About a week ago she noted blisters on her legs on the right o2 on the left o1 since then she has not been wearing the juxta light stockings. She has not had any pain no shortness of breath 09/26/17;; the patient has a stable wound as a result of blistering on the left anterior calf and 2 on the right anterior and right medial. There is another denuded blister on the left anterior more superior. This does not have any current fluid I did not remove the skin today. We've been using silver alginate under compression 10/03/17; the patient's right leg is healed. The area on the left anterior leg all wound sites look better. We've been using silver alginate under compression. In questioning the patient apparently has a juxta light stockings however she has not been using these. We asked her to find them lubricate her skin every night and used a juxta light stocking on the right. I would like to see this next time 10/10/17; both the patient's areas on the left anterior leg o2 are closed the area on the look right  posterior medial calf remains closed. She is been using a juxta light stocking on the right she has one for the left Readmission [name] is now a 73 year old woman we have had in this clinic at least 3 times before. Most recently she was here in February 2019 for 2 visits with wounds on her bilateral lower extremities because of blisters these healed fairly quickly and we discharged her in juxta lite stockings. She was previously here in 2018 with a wound on her left foot and in 2017 she was sent to see Dr. Oneida Alar for follow-up of PAD. Indeed she seems to have had follow-up noninvasive vascular studies every 6 months. Her current problems began at the end of October she apparently suffered a fall with a laceration on her right anterior leg. She had this sutured on 06/23/2018. Sometime in the same timeframe she developed blistering over a large area of the left anterior calf, the right great toe. The blisters have spontaneously ruptured and she has large areas of the epithelial loss. She also has non-open blisters on the medial part of the left great toe. There is also an open area on the right second toe. I  do not believe that she was using her compression stockings [not juxta lites] because the swelling in her legs had gotten too large for her to wrap. She tells me she is recently been to her nephrologist and had her Lasix increased which is helped somewhat with the swelling. The patient did not have arterial studies done in our clinic. Her most recent noninvasive studies were in August. These showed ABIs noncompressible bilaterally. She had TBI's on the right at 0.54 and on the left at 0.60 biphasic waveforms at the PTA and DP bilaterally. She was felt to have bilateral ABIs that were unchanged from her previous study in October 2018. However it was noted her TBI's were decreased. Past medical history includes type 2 diabetes with peripheral neuropathy with a recent hemoglobin A1c of 7. She  is apparently off her diabetes medications, she has stage III chronic renal failure, hyperlipidemia, hypertension, cataracts, retinopathy, rheumatoid arthritis, iron deficiency and apparently is receiving IV iron, urge incontinence 07/10/2018; she arrives today with everything looking quite a bit better. The large area of blistered denuded epithelium on the left calf has healed over. The area on the right lateral tibial area is also mostly healed still with a linear open area where her laceration was. The area on her left medial toe is just about healed. She has a dark thick black eschar over the tip of her right first toe. I do not remember this from last week this feels almost like the eschar you would see with ischemia yet her toes are warm and her peripheral pulses are palpable. She does have some degree of PAD but I do not think that was sufficient enough to have caused this. 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 Patient originally came here with a laceration injury of her right lateral mid calf. She also had an area of denuded skin on the left which is since closed over. She had a small area on the tip of her right great toe as well. She arrives today with what looks to be dry gangrene on the right great toe as well as discoloration of the tips of her second and third toes. She is not in any pain. She also had purulent drainage coming out open area on her left midfoot which was new this week. Culture was obtained The patient is known to vascular surgery having last been seen on 04/02/2018. They follow her for chronic arterial insufficiency. Last arterial studies were in August 2017 at which time her ABIs were noncompressible however her TBI was 0.54 on the right and 0.60 on the left. She was noted to have bilateral ankle arteries remain calcified. Waveforms biphasic. Slight decline in bilateral TBI's 07/24/2018; the patient was graciously seen  urgently by vein and vascular with regards to critical limb ischemia, she had a CO2 angiogram and then contrast to look at the lower extremity arteries. Surprisingly no significant vascular disease was noted in the aortic iliac set segment, patent common femoral and profunda arteries patent popliteal and three-vessel runoff with all 3 of the tibials present. Her dorsalis pedis and plantar arch vessels also filled. Notable for the fact that it was felt that she had small vessel disease near her toes but this was not amenable for intervention. The abscess that I unroofed last week on the plantar left foot grew Stenotrophomonas Maltophilia. This is not a usual true wound pathogen. However it had abundant organisms and given the fact that is  an abscess I went ahead and treated this with Levaquin 250 daily for 7 days. I had her on doxycycline last week 07/31/2018; I put 3 layer compression on the right leg last week. She arrives in today with almost no edema in the right leg and out of concern for the this in the gangrenous toes I am going to reduce this back to Kerlix and Coban which is what we had her on at the beginning. We are using silver alginate to the traumatic wound on her right anterior leg and this looks a lot better. The area on the left foot is healed and the patient is using her own wraparound compression stocking here. She still has the dry gangrene-like changes predominantly involving the right first toe, tip of her right second and third toes. We are using Betadine here. The exact etiology of this was not completely clear 08/07/18; we've been using 2 layer compression on the right leg. She still has the gangrenous toes for second and third on the right and the original laceration injury. She does not have any significant vascular issues to the level of the ankle. She does have small vessel disease in her toes.The left mid foot wound is closed 08/14/18; the patient arrives in clinic  today with improvement in the original area on her right lateral leg. The 3 dry gangrenous toes first second and third on the right all look stable except for the second. This was obviously separating. It was removed fortunately there is healthy-looking surface tissue underneath this. ooShe arrives in clinic with a reopening on the left midfoot. This was previously a small abscess that had closed. She has Charcot feet bilaterally 08/24/2018; right lateral leg wound is smaller. The 3 dry gangrenous first second and third toes are a bit changed. The third toe was separating I remove the eschar and subcutaneous tissue to reveal a small open area at the tip of the toe just at the base at the head of the nail. The first toe is unchanged. ooLeft plantar foot is larger. This is in the Charcot foot area. I removed nonviable circumference and surface from the wound using silver alginate 08/31/2018; right lateral leg wound continues to contract also the area on her plantar foot. First great toe is beginning to separate and the nail will soon I think fall off.. She sees vein and vascular tomorrow. 1/20; right lateral leg wound continues to contract and is almost closed first great toe is beginning to separate. The area on her left plantar foot still has rolled edges around the wound and some debris in the surface but generally smaller. We have been using silver alginate to the 2 remaining wound areas and Betadine to the left great toe. The patient saw vein and vascular on 1/14. They did not add anything here. Noted that the patient has microvascular disease but no major macrovascular issues. No intervention was performed during her CO2 aortogram on 07/23/2018. 2/3; 2-week follow-up. Right leg wound which is her original wound and coming here is healed. Second and third right toes have healed. She still has dry eschared/dry gangrene over the right first toe. I thought this might begin to separate but it really  is not making great headway with that. The area on the left midfoot reopened 2 to 3 weeks ago and this is made no progress. Finally she has new blisters on the left first toe laterally and the left second toe dorsally over the DIP at the base of the  nail 2/17; 2-week follow-up. Right leg wound which is her original wound is healed. Right second and third toes are still healed. She still has the black ischemic eschar over the first toe which has not really begun to separate. The area on the left midfoot is unfortunately worse with large amounts of denuded skin around this. She has blisters on the medial part of her first toe and the dorsal part of the second toe on the left 2/24; she still has the black ischemic eschar over the first toe which is not really separating. The area on the left midfoot seems stable from last week. The blisters on the medial part of her first toe and dorsal second toe had hardened and I removed both of these there are small open areas underneath. 3/2; left first and second toes are healed. The area on the left plantar foot looks better. Finally the right great toe area is beginning to separate. 3/9; left first and second toes remain healed. Left plantar foot continues to look excellent smaller with a healthy base. Finally the right great toe had separated further. I went ahead and remove the eschar here. There is still a fairly sizable wound here but this will give [place] a better chance to address this. 3/16-Left first and second toes mostly healed although the left first toe appears to have an open area Right great toe deep ulcer with exposed bone, greenish debris with necrotic tissue, right second toe wound with clean edges and base. Patient had silver alginate dressing to all the toes especially on the right. SHe has been offloading with inserts and open toe shoes 3/23; since I last saw this patient 2 weeks ago there is been quite a bit of deterioration. The left  plantar foot continues to look about the same. She now has an area on the left lateral first toe. On the right foot the first toe has a fair amount of exposed bone. X-ray that we did last week did not show osteomyelitis nevertheless that would have to be a concern. She also has an area on the tip of the right second toe that is new 3/30; some improvement in the left plantar foot. Bone I took out of the right first toe last week did not show osteomyelitis under pathology however culture did grow rare staph aureus and rare Citrobacter. We have been using silver alginate to all her wounds 4/6; left plantar foot wound about the same although this does not look ominous. The right first toe which is been very problematic does not have exposed bone aggressive debridement of the surface of this. She does have grams of epithelialization. We have been using silver alginate on both wounds 4/13; left plantar foot wound about the same. Removed skin and subcutaneous tissue from the wound circumference. ooThe right great toe has underlying osteomyelitis I have extended her Levaquin 500 mg for another 2 weeks. She does not complain of any overt side effects. 4/23;. The patient has wounds on the left plantar foot, left lateral great toe and a new area on the dorsal part of the right second toe. Her large area over the tip of her right great toe. She is on Levaquin I will need to check on when we want to finish this. This is for underlying osteomyelitis of the right great toe 5/4; the patient has or should be completing her Levaquin at this point. This is for osteomyelitis in the right great toe. She also has wounds on her left  plantar foot and the dorsal part of her right second toe. We have been using silver alginate 5/11; patient arrives today with her right great toe generally looking better. However the area on the plantar left foot had considerable undermining and discomfort around the wound and some  erythema. We have been using silver alginate 5/18-Patient returns at 1 week the right great toe is much more macerated around the edges of the wound, the left foot wounds are considerably better especially the left great toe and left second toe. We have been using silver alginate to all wounds and juxta lite to the right leg and 2 layer compression to the left. THere is now a fluid filled bleb on the posterior calf of RLE 5/26; right great toe continues to improve. The underlying osteomyelitis was treated empirically in this clinic. ooLeft midfoot wound also looks better than the last time I saw this 2 weeks ago. ooApparently last week she had a blister on the right posterior calf that was largely. This was not specifically addressed. It is open since last time she has a large area on the right posterior mid calf with skin attached to a large amount of the circumference. 6/1; we continue to have true improvements in both of the wound areas on the right great toe and left plantar foot. The large blister on the posterior right calf is just about fully epithelialized although there is still some weeping edema in this area 6/8; we continued to have been improvements in both areas currently on the right great toe and left plantar foot. The large blister on the posterior right calf is fully healed. 6/15; we we continue to have improvements in the right great toe. The left plantar foot which was a small wound without undermining last week has decided to expand into the lateral foot. There is some swelling in this area. She is not systemically unwell 6/22; the culture of her left foot that I did last week grew Enterobacter I had her on doxycycline. There was also clearly some purulent drainage coming from this today through the original wound with the swelling on the lateral part of her foot. She is not systemically unwell. 6/25 the patient is on Levaquin for the Enterobacter cultured out of her left  foot. She now has an extensive wound area in the medial left foot we are using silver alginate The patient complains of pain in the foot going up towards the metatarsal head. There is also swelling inferiorly to this area that we identified last week but this is not tender 6/29; the patient is completing her Levaquin for the Enterobacter. We are using silver alginate to the extensive wound on the left plantar foot as well as to the right great toe 7/14; the right great toe is healed today. The area on the left foot looks better. The MRI of the left foot did not show osteomyelitis in the area of the wound however it did show an effusion in the tibiotalar joint. The patient has rheumatoid arthritis 7/20; right great toe remains healed. The area on the left foot continues to look improved. 7/27 right great toe remains healed. The area on the left foot continues to contract. Healthy granulation tissue she is only offloading this in surgical shoes. 03/24/2019 on evaluation today patient appears to be doing very well with regard to her plantar foot ulcer. She has been tolerating the dressing changes without complication. She has a healthy granulation surface. There is no signs of active  infection at this time and her swelling seems to be under good control. 8/17; the areas on the left medial midfoot. Probably subluxed bone in this area. Wounds are superficial. It looks as though there was some blistering skin at one point 8/24; the area on the left medial midfoot. This is completely resolved at this point although it still looks somewhat vulnerable. She probably has subluxed bone underneath this area. She has pes planus deformity 9/14; apparently this wound reopened in the 3 weeks since I saw her. She was seen last week she is given new healing sandals. Been using calcium alginate with felt offloading. This is not surprising given the area looks somewhat vulnerable. 9/28; left plantar foot in the  setting of diabetic neuropathy probably some degree of Charcot deformity. She also has abrasions on the left leg which she says are due to the Curlex co-band that was put on by home health. She is also having a lot of pain in the left calf finds the wrap too tight 10/5; left plantar foot has 2 superficial open areas. The abrasions on the left leg from last week are healed. She still has some swelling and tenderness however. Duplex ultrasound of the leg tomorrow tomorrow. 10/13; patient arrived with a deterioration in the left foot from last week. Undermining wound probably connecting the 2 superficial areas from last week. She wears a surgical shoe with felt offloading. Duplex ultrasound she had last week showed a ruptured Baker's cyst. Her leg looks a lot better 10/20; 2 remanent open areas in the left foot. This looks better than last time. We are using silver alginate. She wears a surgical shoe for offloading there is not a more aggressive option 10/26; the patient does not have any open on the left foot we are using silver alginate to the medial aspect of the foot. She wears a surgical shoe on both feet. Juxta lite stocking on the right, she has 1 for the left. This is been a long haul for this patient. Initially coming in with a wound on her right lateral calf. Then her right great toe. More recently areas on the left medial foot Objective Constitutional Sitting or standing Blood Pressure is within target range for patient.. Pulse regular and within target range for patient.Marland Kitchen Respirations regular, non-labored and within target range.. Temperature is normal and within the target range for the patient.Marland Kitchen Appears in no distress. Vitals Time Taken: 3:20 PM, Height: 62 in, Weight: 140 lbs, BMI: 25.6, Temperature: 98.3 F, Pulse: 78 bpm, Respiratory Rate: 20 breaths/min, Blood Pressure: 143/76 mmHg. Eyes Conjunctivae clear. No discharge.no icterus. Respiratory work of breathing is  normal. Cardiovascular It will pulses are palpable. Musculoskeletal Charcot deformity in both feet. On the left foot there is a subluxed cuneiform, not sure which 1. Psychiatric appears at normal baseline. General Notes: Exam; nothing is open on the right leg or foot. The left leg is no open areas. Left foot this week has no open areas the 2 superficial areas have closed over. There is no evidence of surrounding infection. Integumentary (Hair, Skin) Changes of chronic venous insufficiency in both legs. Wound #19 status is Open. Original cause of wound was Gradually Appeared. The wound is located on the [place]. The wound measures 0cm length x 0cm width x 0cm depth; 0cm^2 area and 0cm^3 volume. There is no tunneling or undermining noted. There is a none present amount of drainage noted. The wound margin is flat and intact. There is no granulation within the  wound bed. There is no necrotic tissue within the wound bed. 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 Dressing Change Frequency: Wound #19 Left,Plantar Foot: Other: - Change 2 times per week Skin Barriers/Peri-Wound Care: Wound #19 Left,Plantar Foot: Moisturizing lotion - to both legs Wound Cleansing: Wound #19 Left,Plantar Foot: Clean wound with Wound Cleanser Primary Wound Dressing: Wound #19 Left,Plantar Foot: Foam - for protection Edema Control: Wound #19 Left,Plantar Foot: Avoid standing for long periods of time Elevate legs to the level of the heart or above for 30 minutes daily and/or when sitting, a frequency of: - throughout the day Support Garment 20-30  mm/Hg pressure to: - juxtalite to both legs daily Off-Loading: Open toe surgical shoe to: - left foot with felt. Home Health: [facility] for wound care. - [facility] 1. We continue to offload the plantar left foot in a surgical shoe with felt 2. She continues to wear a surgical shoe on the right foot. 3. She is really never come out of surgical shoes. We have managed to heal her out in the past and she always seems to recur. Electronic Signature(s) Signed: 06/14/2019 5:48:14 PM By: Linton Ham MD Entered By: Linton Ham on 06/14/2019 16:01:04 -------------------------------------------------------------------------------- SuperBill Details Patient Name: Date of Service: [name] 06/14/2019 Medical Record [id number] Patient Account Number: [id number] Date of Birth/Sex: Treating RN: [birth year] (73 y.o. [name] Primary Care Provider: Chrisandra Keller Other Clinician: Referring Provider: Treating Provider/Extender:Robson, Nonda Lou, Ivan Anchors in Treatment: 49 Diagnosis Coding ICD-10 Codes Code Description E11.42 Type 2 diabetes mellitus with diabetic polyneuropathy L97.518 Non-pressure chronic ulcer of other part of right foot with other specified severity L97.521 Non-pressure chronic ulcer of other part of left foot limited to breakdown of skin L02.612 Cutaneous abscess of left foot S80.812D Abrasion, left lower leg, subsequent encounter Facility Procedures The patient participates with Medicare or their insurance follows the Medicare Facility Guidelines: CPT4 Code Description Modifier Quantity [id number] [place] VISIT-LEV 3 EST PT 1 Physician Procedures CPT4 Code Description: [id number] - WC PHYS LEVEL 3 - EST PT ICD-10 Diagnosis Description L97.521 Non-pressure chronic ulcer of other part of left foot limite E11.42 Type 2 diabetes mellitus with diabetic polyneuropathy Modifier: d to  breakdown Quantity: 1 of skin Electronic Signature(s) Signed: 06/14/2019 5:48:14 PM By: Linton Ham MD Entered By: Linton Ham on 06/14/2019 16:01:28

## 2019-06-14 NOTE — Telephone Encounter (Signed)
Attempted to reach patient to discuss elevated TSH, need to adjust dose of her levothyroxine. No answer. LVM asking her to call back. Please let me know when she returns the call.  Wrote letter & faxed electronically to her eye doctor.  [name], MD

## 2019-06-15 ENCOUNTER — Ambulatory Visit
Admission: RE | Admit: 2019-06-15 | Discharge: 2019-06-15 | Disposition: A | Discharge status: Home / Self Care | Payer: Medicare Other | Source: Ambulatory Visit | Attending: Family Medicine | Admitting: Family Medicine

## 2019-06-15 DIAGNOSIS — N63 Unspecified lump in unspecified breast: Secondary | ICD-10-CM

## 2019-06-15 DIAGNOSIS — N6489 Other specified disorders of breast: Secondary | ICD-10-CM | POA: Diagnosis not present

## 2019-06-15 DIAGNOSIS — R928 Other abnormal and inconclusive findings on diagnostic imaging of breast: Secondary | ICD-10-CM | POA: Diagnosis not present

## 2019-06-15 DIAGNOSIS — N6320 Unspecified lump in the left breast, unspecified quadrant: Secondary | ICD-10-CM | POA: Diagnosis not present

## 2019-06-17 DIAGNOSIS — E039 Hypothyroidism, unspecified: Secondary | ICD-10-CM | POA: Diagnosis not present

## 2019-06-17 DIAGNOSIS — E11621 Type 2 diabetes mellitus with foot ulcer: Secondary | ICD-10-CM | POA: Diagnosis not present

## 2019-06-17 DIAGNOSIS — L97421 Non-pressure chronic ulcer of left heel and midfoot limited to breakdown of skin: Secondary | ICD-10-CM | POA: Diagnosis not present

## 2019-06-17 DIAGNOSIS — E1142 Type 2 diabetes mellitus with diabetic polyneuropathy: Secondary | ICD-10-CM | POA: Diagnosis not present

## 2019-06-17 DIAGNOSIS — I10 Essential (primary) hypertension: Secondary | ICD-10-CM | POA: Diagnosis not present

## 2019-06-17 NOTE — Telephone Encounter (Signed)
Returned call. No answer. Will try again tomorrow. [name], MD

## 2019-06-17 NOTE — Telephone Encounter (Signed)
Pt returned call.   PCP not available at the time.  Will route message.  Christen Bame, CMA

## 2019-06-18 ENCOUNTER — Other Ambulatory Visit: Payer: Self-pay

## 2019-06-18 ENCOUNTER — Ambulatory Visit (HOSPITAL_COMMUNITY)
Admission: RE | Admit: 2019-06-18 | Discharge: 2019-06-18 | Disposition: A | Discharge status: Home / Self Care | Payer: Medicare Other | Source: Ambulatory Visit | Attending: Nephrology | Admitting: Nephrology

## 2019-06-18 VITALS — BP 173/88 | HR 73 | Temp 96.9°F | Resp 20

## 2019-06-18 DIAGNOSIS — D631 Anemia in chronic kidney disease: Secondary | ICD-10-CM | POA: Insufficient documentation

## 2019-06-18 DIAGNOSIS — N189 Chronic kidney disease, unspecified: Secondary | ICD-10-CM | POA: Insufficient documentation

## 2019-06-18 DIAGNOSIS — D508 Other iron deficiency anemias: Secondary | ICD-10-CM | POA: Diagnosis not present

## 2019-06-18 LAB — POCT HEMOGLOBIN-HEMACUE: Hemoglobin: 11.9 g/dL — ABNORMAL LOW (ref 12.0–15.0)

## 2019-06-18 MED ORDER — EPOETIN ALFA-EPBX 40000 UNIT/ML IJ SOLN
40000.0000 [IU] | INTRAMUSCULAR | Status: DC
  Administered 2019-06-18: 11:00:00 40000 [IU] via SUBCUTANEOUS
  Filled 2019-06-18: qty 1

## 2019-06-21 NOTE — Telephone Encounter (Signed)
Patient calls nurse let returning PCP call. Please call her [phone].

## 2019-06-21 NOTE — Telephone Encounter (Signed)
Called patient and was able to reach her. Advised of high TSH. On review of medication administration, patient admits to taking her levothyroxine with food in the AM. Advised taking it on an empty stomach, 30-60 mins before eating food. She will make this change and we will recheck TSH in 4 weeks. Lab visit scheduled. No changes to medication right now.  [name], MD

## 2019-06-24 DIAGNOSIS — I10 Essential (primary) hypertension: Secondary | ICD-10-CM | POA: Diagnosis not present

## 2019-06-24 DIAGNOSIS — L97421 Non-pressure chronic ulcer of left heel and midfoot limited to breakdown of skin: Secondary | ICD-10-CM | POA: Diagnosis not present

## 2019-06-24 DIAGNOSIS — E11621 Type 2 diabetes mellitus with foot ulcer: Secondary | ICD-10-CM | POA: Diagnosis not present

## 2019-06-24 DIAGNOSIS — E039 Hypothyroidism, unspecified: Secondary | ICD-10-CM | POA: Diagnosis not present

## 2019-06-24 DIAGNOSIS — E1142 Type 2 diabetes mellitus with diabetic polyneuropathy: Secondary | ICD-10-CM | POA: Diagnosis not present

## 2019-06-28 ENCOUNTER — Encounter (HOSPITAL_BASED_OUTPATIENT_CLINIC_OR_DEPARTMENT_OTHER): Payer: Medicare Other | Admitting: Internal Medicine

## 2019-06-30 ENCOUNTER — Encounter (HOSPITAL_BASED_OUTPATIENT_CLINIC_OR_DEPARTMENT_OTHER): Payer: Medicare Other | Attending: Internal Medicine | Admitting: Physician Assistant

## 2019-06-30 ENCOUNTER — Other Ambulatory Visit: Payer: Self-pay

## 2019-06-30 DIAGNOSIS — E11319 Type 2 diabetes mellitus with unspecified diabetic retinopathy without macular edema: Secondary | ICD-10-CM | POA: Diagnosis not present

## 2019-06-30 DIAGNOSIS — E11621 Type 2 diabetes mellitus with foot ulcer: Secondary | ICD-10-CM | POA: Insufficient documentation

## 2019-06-30 DIAGNOSIS — L97518 Non-pressure chronic ulcer of other part of right foot with other specified severity: Secondary | ICD-10-CM | POA: Diagnosis not present

## 2019-06-30 DIAGNOSIS — L02612 Cutaneous abscess of left foot: Secondary | ICD-10-CM | POA: Diagnosis not present

## 2019-06-30 DIAGNOSIS — E1151 Type 2 diabetes mellitus with diabetic peripheral angiopathy without gangrene: Secondary | ICD-10-CM | POA: Diagnosis not present

## 2019-06-30 DIAGNOSIS — N183 Chronic kidney disease, stage 3 unspecified: Secondary | ICD-10-CM | POA: Diagnosis not present

## 2019-06-30 DIAGNOSIS — F21 Schizotypal disorder: Secondary | ICD-10-CM | POA: Diagnosis not present

## 2019-06-30 DIAGNOSIS — E1142 Type 2 diabetes mellitus with diabetic polyneuropathy: Secondary | ICD-10-CM | POA: Diagnosis not present

## 2019-06-30 DIAGNOSIS — S80812D Abrasion, left lower leg, subsequent encounter: Secondary | ICD-10-CM | POA: Insufficient documentation

## 2019-06-30 DIAGNOSIS — I129 Hypertensive chronic kidney disease with stage 1 through stage 4 chronic kidney disease, or unspecified chronic kidney disease: Secondary | ICD-10-CM | POA: Diagnosis not present

## 2019-06-30 DIAGNOSIS — L97522 Non-pressure chronic ulcer of other part of left foot with fat layer exposed: Secondary | ICD-10-CM | POA: Diagnosis not present

## 2019-06-30 DIAGNOSIS — E039 Hypothyroidism, unspecified: Secondary | ICD-10-CM | POA: Diagnosis not present

## 2019-06-30 DIAGNOSIS — E785 Hyperlipidemia, unspecified: Secondary | ICD-10-CM | POA: Insufficient documentation

## 2019-06-30 DIAGNOSIS — X58XXXD Exposure to other specified factors, subsequent encounter: Secondary | ICD-10-CM | POA: Insufficient documentation

## 2019-06-30 DIAGNOSIS — L97521 Non-pressure chronic ulcer of other part of left foot limited to breakdown of skin: Secondary | ICD-10-CM | POA: Diagnosis not present

## 2019-06-30 DIAGNOSIS — E1122 Type 2 diabetes mellitus with diabetic chronic kidney disease: Secondary | ICD-10-CM | POA: Insufficient documentation

## 2019-06-30 DIAGNOSIS — M069 Rheumatoid arthritis, unspecified: Secondary | ICD-10-CM | POA: Insufficient documentation

## 2019-06-30 NOTE — Progress Notes (Signed)
[name], [name] ([id number]) Visit Report for 06/30/2019 Chief Complaint Document Details Patient Name: Date of Service: [name], YEARWOOD 06/30/2019 12:30 PM Medical Record [id number] Patient Account Number: [id number] Date of Birth/Sex: Treating RN: Aug 04, 1946 (73 y.o. [name], [name] Primary Care Provider: Chrisandra Netters Other Clinician: Referring Provider: Treating Provider/Extender:Stone III, Anabel Bene, Ivan Anchors in Treatment: 51 Information Obtained from: Patient Chief Complaint Patients presents for treatment of an open diabetic ulcer to the left foot which she's had for about 2 weeks now 09/19/17; patient is here for review of blistered areas on the right leg 2 and on the left leg 1 which have been present for the last week 07/03/2018; patient comes back to clinic for review of wounds on her bilateral lower extremities Electronic Signature(s) Signed: 06/30/2019 5:02:14 PM By: Worthy Keeler PA-C Entered By: Worthy Keeler on 06/30/2019 12:56:45 -------------------------------------------------------------------------------- Debridement Details Patient Name: Date of Service: [name]. 06/30/2019 12:30 PM Medical Record [id number] Patient Account Number: [id number] Date of Birth/Sex: [birth year] (73 y.o. F) Treating RN: Baruch Gouty Primary Care Provider: Chrisandra Netters Other Clinician: Referring Provider: Treating Provider/Extender:Stone III, Anabel Bene, Ivan Anchors in Treatment: 51 Debridement Performed for Wound #19 Left,Plantar Foot Assessment: Performed By: Physician Worthy Keeler, PA Debridement Type: Debridement Severity of Tissue Pre Fat layer exposed Debridement: Level of Consciousness (Pre- Awake and Alert procedure): Pre-procedure Verification/Time Out Taken: Yes - 13:00 Start Time: 13:03 Pain Control: Other : benzocaine 20% spray Total Area Debrided (L x W): 0.8 (cm) x 1 (cm) = 0.8 (cm) Tissue and other  material Viable, Non-Viable, Slough, Subcutaneous, Skin: Epidermis, Slough debrided: Level: Skin/Subcutaneous Tissue Debridement Description: Excisional Instrument: Curette Bleeding: Minimum Hemostasis Achieved: Pressure End Time: 13:05 Procedural Pain: 0 Post Procedural Pain: 0 Response to Treatment: Procedure was tolerated well Level of Consciousness Awake and Alert (Post-procedure): Post Debridement Measurements of Total Wound Length: (cm) 0.6 Width: (cm) 1 Depth: (cm) 0.1 Volume: (cm) 0.047 Character of Wound/Ulcer Post Improved Debridement: Severity of Tissue Post Debridement: Fat layer exposed Post Procedure Diagnosis Same as Pre-procedure Electronic Signature(s) Signed: 06/30/2019 5:02:14 PM By: Worthy Keeler PA-C Signed: 06/30/2019 5:49:26 PM By: Baruch Gouty RN, BSN Entered By: Baruch Gouty on 06/30/2019 13:10:46 -------------------------------------------------------------------------------- HPI Details Patient Name: Date of Service: [name]. 06/30/2019 12:30 PM Medical Record [id number] Patient Account Number: [id number] Date of Birth/Sex: Treating RN: [birth year] (73 y.o. [name] Primary Care Provider: Chrisandra Netters Other Clinician: Referring Provider: Treating Provider/Extender:Stone III, Anabel Bene, Ivan Anchors in Treatment: 53 History of Present Illness HPI Description: 73 year old patient who is known to our practice from May of last year was fully worked up with a venous and arterial duplex study and was referred to the vascular surgeon for follow-up. The patient says she has seen the vascular surgeons and has an appointment back in April. No procedure was done. She has developed a blister on the left big toe and forefoot which has been fairly large and draining fluid and she self-referred herself to [place]. The patient was recently seen in the ER on 10/06/2016, and was treated with clindamycin and asked to see  the podiatrist and PCP. She saw the podiatrist Dr. Felisa Bonier on 10/11/2016 who I understand did an x-ray but no report is available. Of note last year when I saw her, I had referred her to do Dr. Ruta Hinds -- he saw on 02/04/2016. he reviewed and noted that she had evidence of peripheral arterial disease and recommended a follow-up in 6 months  for repeat noninvasive arterial exam. Wounds worsened he would consider an angiogram. He saw her back on 03/14/2016, and continued conservative treatment and asked her to come back for noninvasive studies in 6 months. 12/03/2016 -- was seen in the office on 11/28/2016 and note is made of the fact that venous duplex examination on 01/02/2016 showed no evidence of reflux. Did have evidence of peripheral arterial disease and was asked to follow- up in 6 months time. Recent data on 11/28/2016 showed right noncompressible with monophasic waveforms left noncompressible with monophasic waveforms. The thought was she has medial calcifications of the arteries and had bilateral toe brachial indices which were normal. Right toe brachial index was 0.74 and the left toe brachial index was 0.80 She would return for a vascular study in 6 months when her ABI would be checked again. 7/6-Patient is back at 1 week visit to the clinic, home health is attending to the wound once a week, we have been using silver alginate, MRI scheduled this week on Thursday for the left foot. We are seeing her for the left foot plantar ulcer and the right great toe ulcer 12/24/2016 -- the left plantar heel had a large callus which came off during her evaluation and there was an open ulcer at the base ==== Old notes She was recently seen by her PCP for an ulcer of the left toe which was treated at the urgent care by giving her ciprofloxacin which she has been taking. She has uncontrolled diabetes and A1c was 13.7 done last week. Past medical history significant for poorly controlled  diabetes mellitus type 2, obesity, osteopenia, GERD, diabetic retinopathy, diabetic neuropathy, reported arthritis, hypothyroidism, hypertension. No x-rays were done recently. 12/27/2015 -- had an x-ray of the left foot --IMPRESSION:Juxta-articular erosions at the IP joint great toe and diffusely in the MTP joints greatest at first, suggesting an inflammatory arthropathy such as rheumatoid arthritis or gout.While septic arthritis can cause juxta-articular erosions, the multiplicity of joints involved makes this less likely. Significant soft tissue swelling with single tiny questionable focus of soft tissue gas medial to the first metatarsal head. If patient has persistent symptoms or persistent clinical concern for osteomyelitis, recommend MR imaging of the LEFT foot (with and without contrast if renal function permits). 01/03/2016 -- the lower extremity venous duplex reflex evaluation showed no evidence of deep or superficial thrombus or reflux. The arterial study done and showed the resting right ABI is greater than 1.3 and the left ABI was greater than 1.3 indicating arterial medial calcification. The right TBI was less than 0.64 and the left ABI was less than 0.64 both of which are abnormal. except for the left posterior tibial which is triphasic on the flow is biphasic. With the above results due to the fact that she has diabetes mellitus and would recommend she sees the vascular surgeon to see if any further intervention or procedure needs to be planned. ====== READMISSION 09/19/17;this is a 73 year old woman that we've had in our clinic previously for wounds on predominantly her left foot left toe and left heel. She is a diabetic. She has had lower extremity workups for both venous reflux disease and arterial disease. She has known noncompressible vessels bilaterally however in May 2017 as noted above her TBI was 0.64 and the left TBI was less than 0.64. When she was last in clinicin May  2017 the left heel wound had closed. She was faithfully wearing her juxta light stockings. She tells me 2-3 weeks ago she developed  increasing edema in her legs. She was seen by Dr. Valentino Saxon of nephrology who apparently increased her Lasix from 40-80 mg twice a day. The patient has not noticed any improvement in the edema. She does not weigh herself. About a week ago she noted blisters on her legs on the right 2 on the left 1 since then she has not been wearing the juxta light stockings. She has not had any pain no shortness of breath 09/26/17;; the patient has a stable wound as a result of blistering on the left anterior calf and 2 on the right anterior and right medial. There is another denuded blister on the left anterior more superior. This does not have any current fluid I did not remove the skin today. We've been using silver alginate under compression 10/03/17; the patient's right leg is healed. The area on the left anterior leg all wound sites look better. We've been using silver alginate under compression. In questioning the patient apparently has a juxta light stockings however she has not been using these. We asked her to find them lubricate her skin every night and used a juxta light stocking on the right. I would like to see this next time 10/10/17; both the patient's areas on the left anterior leg 2 are closed the area on the look right posterior medial calf remains closed. She is been using a juxta light stocking on the right she has one for the left Readmission Mrs. Joya Gaskins is now a 73 year old woman we have had in this clinic at least 3 times before. Most recently she was here in February 2019 for 2 visits with wounds on her bilateral lower extremities because of blisters these healed fairly quickly and we discharged her in juxta lite stockings. She was previously here in 2018 with a wound on her left foot and in 2017 she was sent to see Dr. Oneida Alar for follow-up of PAD. Indeed she  seems to have had follow-up noninvasive vascular studies every 6 months. Her current problems began at the end of October she apparently suffered a fall with a laceration on her right anterior leg. She had this sutured on 06/23/2018. Sometime in the same timeframe she developed blistering over a large area of the left anterior calf, the right great toe. The blisters have spontaneously ruptured and she has large areas of the epithelial loss. She also has non-open blisters on the medial part of the left great toe. There is also an open area on the right second toe. I do not believe that she was using her compression stockings [not juxta lites] because the swelling in her legs had gotten too large for her to wrap. She tells me she is recently been to her nephrologist and had her Lasix increased which is helped somewhat with the swelling. The patient did not have arterial studies done in our clinic. Her most recent noninvasive studies were in August. These showed ABIs noncompressible bilaterally. She had TBI's on the right at 0.54 and on the left at 0.60 biphasic waveforms at the PTA and DP bilaterally. She was felt to have bilateral ABIs that were unchanged from her previous study in October 2018. However it was noted her TBI's were decreased. Past medical history includes type 2 diabetes with peripheral neuropathy with a recent hemoglobin A1c of 7. She is apparently off her diabetes medications, she has stage III chronic renal failure, hyperlipidemia, hypertension, cataracts, retinopathy, rheumatoid arthritis, iron deficiency and apparently is receiving IV iron, urge incontinence 07/10/2018; she arrives  today with everything looking quite a bit better. The large area of blistered denuded epithelium on the left calf has healed over. The area on the right lateral tibial area is also mostly healed still with a linear open area where her laceration was. The area on her left medial toe is just about  healed. She has a dark thick black eschar over the tip of her right first toe. I do not remember this from last week this feels almost like the eschar you would see with ischemia yet her toes are warm and her peripheral pulses are palpable. She does have some degree of PAD but I do not think that was sufficient enough to have caused this. 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 Patient originally came here with a laceration injury of her right lateral mid calf. She also had an area of denuded skin on the left which is since closed over. She had a small area on the tip of her right great toe as well. She arrives today with what looks to be dry gangrene on the right great toe as well as discoloration of the tips of her second and third toes. She is not in any pain. She also had purulent drainage coming out open area on her left midfoot which was new this week. Culture was obtained The patient is known to vascular surgery having last been seen on 04/02/2018. They follow her for chronic arterial insufficiency. Last arterial studies were in August 2017 at which time her ABIs were noncompressible however her TBI was 0.54 on the right and 0.60 on the left. She was noted to have bilateral ankle arteries remain calcified. Waveforms biphasic. Slight decline in bilateral TBI's 07/24/2018; the patient was graciously seen urgently by vein and vascular with regards to critical limb ischemia, she had a CO2 angiogram and then contrast to look at the lower extremity arteries. Surprisingly no significant vascular disease was noted in the aortic iliac set segment, patent common femoral and profunda arteries patent popliteal and three-vessel runoff with all 3 of the tibials present. Her dorsalis pedis and plantar arch vessels also filled. Notable for the fact that it was felt that she had small vessel disease near her toes but this was not amenable for intervention. The  abscess that I unroofed last week on the plantar left foot grew Stenotrophomonas Maltophilia. This is not a usual true wound pathogen. However it had abundant organisms and given the fact that is an abscess I went ahead and treated this with Levaquin 250 daily for 7 days. I had her on doxycycline last week 07/31/2018; I put 3 layer compression on the right leg last week. She arrives in today with almost no edema in the right leg and out of concern for the this in the gangrenous toes I am going to reduce this back to Kerlix and Coban which is what we had her on at the beginning. We are using silver alginate to the traumatic wound on her right anterior leg and this looks a lot better. The area on the left foot is healed and the patient is using her own wraparound compression stocking here. She still has the dry gangrene-like changes predominantly involving the right first toe, tip of her right second and third toes. We are using Betadine here. The exact etiology of this was not completely clear 08/07/18; we've been using 2 layer compression on the right leg. She still has the gangrenous toes for  second and third on the right and the original laceration injury. She does not have any significant vascular issues to the level of the ankle. She does have small vessel disease in her toes.The left mid foot wound is closed 08/14/18; the patient arrives in clinic today with improvement in the original area on her right lateral leg. The 3 dry gangrenous toes first second and third on the right all look stable except for the second. This was obviously separating. It was removed fortunately there is healthy-looking surface tissue underneath this. She arrives in clinic with a reopening on the left midfoot. This was previously a small abscess that had closed. She has Charcot feet bilaterally 08/24/2018; right lateral leg wound is smaller. The 3 dry gangrenous first second and third toes are a bit changed. The third  toe was separating I remove the eschar and subcutaneous tissue to reveal a small open area at the tip of the toe just at the base at the head of the nail. The first toe is unchanged. Left plantar foot is larger. This is in the Charcot foot area. I removed nonviable circumference and surface from the wound using silver alginate 08/31/2018; right lateral leg wound continues to contract also the area on her plantar foot. First great toe is beginning to separate and the nail will soon I think fall off.. She sees vein and vascular tomorrow. 1/20; right lateral leg wound continues to contract and is almost closed first great toe is beginning to separate. The area on her left plantar foot still has rolled edges around the wound and some debris in the surface but generally smaller. We have been using silver alginate to the 2 remaining wound areas and Betadine to the left great toe. The patient saw vein and vascular on 1/14. They did not add anything here. Noted that the patient has microvascular disease but no major macrovascular issues. No intervention was performed during her CO2 aortogram on 07/23/2018. 2/3; 2-week follow-up. Right leg wound which is her original wound and coming here is healed. Second and third right toes have healed. She still has dry eschared/dry gangrene over the right first toe. I thought this might begin to separate but it really is not making great headway with that. The area on the left midfoot reopened 2 to 3 weeks ago and this is made no progress. Finally she has new blisters on the left first toe laterally and the left second toe dorsally over the DIP at the base of the nail 2/17; 2-week follow-up. Right leg wound which is her original wound is healed. Right second and third toes are still healed. She still has the black ischemic eschar over the first toe which has not really begun to separate. The area on the left midfoot is unfortunately worse with large amounts of denuded  skin around this. She has blisters on the medial part of her first toe and the dorsal part of the second toe on the left 2/24; she still has the black ischemic eschar over the first toe which is not really separating. The area on the left midfoot seems stable from last week. The blisters on the medial part of her first toe and dorsal second toe had hardened and I removed both of these there are small open areas underneath. 3/2; left first and second toes are healed. The area on the left plantar foot looks better. Finally the right great toe area is beginning to separate. 3/9; left first and second toes remain healed.  Left plantar foot continues to look excellent smaller with a healthy base. Finally the right great toe had separated further. I went ahead and remove the eschar here. There is still a fairly sizable wound here but this will give [place] a better chance to address this. 3/16-Left first and second toes mostly healed although the left first toe appears to have an open area Right great toe deep ulcer with exposed bone, greenish debris with necrotic tissue, right second toe wound with clean edges and base. Patient had silver alginate dressing to all the toes especially on the right. SHe has been offloading with inserts and open toe shoes 3/23; since I last saw this patient 2 weeks ago there is been quite a bit of deterioration. The left plantar foot continues to look about the same. She now has an area on the left lateral first toe. On the right foot the first toe has a fair amount of exposed bone. X-ray that we did last week did not show osteomyelitis nevertheless that would have to be a concern. She also has an area on the tip of the right second toe that is new 3/30; some improvement in the left plantar foot. Bone I took out of the right first toe last week did not show osteomyelitis under pathology however culture did grow rare staph aureus and rare Citrobacter. We have been using silver  alginate to all her wounds 4/6; left plantar foot wound about the same although this does not look ominous. The right first toe which is been very problematic does not have exposed bone aggressive debridement of the surface of this. She does have grams of epithelialization. We have been using silver alginate on both wounds 4/13; left plantar foot wound about the same. Removed skin and subcutaneous tissue from the wound circumference. The right great toe has underlying osteomyelitis I have extended her Levaquin 500 mg for another 2 weeks. She does not complain of any overt side effects. 4/23;. The patient has wounds on the left plantar foot, left lateral great toe and a new area on the dorsal part of the right second toe. Her large area over the tip of her right great toe. She is on Levaquin I will need to check on when we want to finish this. This is for underlying osteomyelitis of the right great toe 5/4; the patient has or should be completing her Levaquin at this point. This is for osteomyelitis in the right great toe. She also has wounds on her left plantar foot and the dorsal part of her right second toe. We have been using silver alginate 5/11; patient arrives today with her right great toe generally looking better. However the area on the plantar left foot had considerable undermining and discomfort around the wound and some erythema. We have been using silver alginate 5/18-Patient returns at 1 week the right great toe is much more macerated around the edges of the wound, the left foot wounds are considerably better especially the left great toe and left second toe. We have been using silver alginate to all wounds and juxta lite to the right leg and 2 layer compression to the left. THere is now a fluid filled bleb on the posterior calf of RLE 5/26; right great toe continues to improve. The underlying osteomyelitis was treated empirically in this clinic. Left midfoot wound also looks  better than the last time I saw this 2 weeks ago. Apparently last week she had a blister on the right posterior calf  that was largely. This was not specifically addressed. It is open since last time she has a large area on the right posterior mid calf with skin attached to a large amount of the circumference. 6/1; we continue to have true improvements in both of the wound areas on the right great toe and left plantar foot. The large blister on the posterior right calf is just about fully epithelialized although there is still some weeping edema in this area 6/8; we continued to have been improvements in both areas currently on the right great toe and left plantar foot. The large blister on the posterior right calf is fully healed. 6/15; we we continue to have improvements in the right great toe. The left plantar foot which was a small wound without undermining last week has decided to expand into the lateral foot. There is some swelling in this area. She is not systemically unwell 6/22; the culture of her left foot that I did last week grew Enterobacter I had her on doxycycline. There was also clearly some purulent drainage coming from this today through the original wound with the swelling on the lateral part of her foot. She is not systemically unwell. 6/25 the patient is on Levaquin for the Enterobacter cultured out of her left foot. She now has an extensive wound area in the medial left foot we are using silver alginate The patient complains of pain in the foot going up towards the metatarsal head. There is also swelling inferiorly to this area that we identified last week but this is not tender 6/29; the patient is completing her Levaquin for the Enterobacter. We are using silver alginate to the extensive wound on the left plantar foot as well as to the right great toe 7/14; the right great toe is healed today. The area on the left foot looks better. The MRI of the left foot did not  show osteomyelitis in the area of the wound however it did show an effusion in the tibiotalar joint. The patient has rheumatoid arthritis 7/20; right great toe remains healed. The area on the left foot continues to look improved. 7/27 right great toe remains healed. The area on the left foot continues to contract. Healthy granulation tissue she is only offloading this in surgical shoes. 03/24/2019 on evaluation today patient appears to be doing very well with regard to her plantar foot ulcer. She has been tolerating the dressing changes without complication. She has a healthy granulation surface. There is no signs of active infection at this time and her swelling seems to be under good control. 8/17; the areas on the left medial midfoot. Probably subluxed bone in this area. Wounds are superficial. It looks as though there was some blistering skin at one point 8/24; the area on the left medial midfoot. This is completely resolved at this point although it still looks somewhat vulnerable. She probably has subluxed bone underneath this area. She has pes planus deformity 9/14; apparently this wound reopened in the 3 weeks since I saw her. She was seen last week she is given new healing sandals. Been using calcium alginate with felt offloading. This is not surprising given the area looks somewhat vulnerable. 9/28; left plantar foot in the setting of diabetic neuropathy probably some degree of Charcot deformity. She also has abrasions on the left leg which she says are due to the Curlex co-band that was put on by home health. She is also having a lot of pain in the left calf finds  the wrap too tight 10/5; left plantar foot has 2 superficial open areas. The abrasions on the left leg from last week are healed. She still has some swelling and tenderness however. Duplex ultrasound of the leg tomorrow tomorrow. 10/13; patient arrived with a deterioration in the left foot from last week. Undermining wound  probably connecting the 2 superficial areas from last week. She wears a surgical shoe with felt offloading. Duplex ultrasound she had last week showed a ruptured Baker's cyst. Her leg looks a lot better 10/20; 2 remanent open areas in the left foot. This looks better than last time. We are using silver alginate. She wears a surgical shoe for offloading there is not a more aggressive option 10/26; the patient does not have any open on the left foot we are using silver alginate to the medial aspect of the foot. She wears a surgical shoe on both feet. Juxta lite stocking on the right, she has 1 for the left. This is been a long haul for this patient. Initially coming in with a wound on her right lateral calf. Then her right great toe. More recently areas on the left medial foot 06/30/2019 on evaluation today this is a patient that I regularly do not see but unfortunately she has a wound on the plantar aspect of her foot which I have seen before that appeared to likely be healed last week although it reopened before she even got to the end of the week. This is mainly on the plantar aspect of her foot. She is really not been able to find any sandals that Dr. Dellia Nims was talking to her about as far as going forward what she should be wearing. Fortunately there is no evidence of active infection at this time which is good news. No fevers, chills, nausea, vomiting, or diarrhea. Electronic Signature(s) Signed: 06/30/2019 5:02:14 PM By: Worthy Keeler PA-C Entered By: Worthy Keeler on 06/30/2019 13:12:27 -------------------------------------------------------------------------------- Physical Exam Details Patient Name: Date of Service: RICK, WARNICK 06/30/2019 12:30 PM Medical Record [id number] Patient Account Number: [id number] Date of Birth/Sex: Treating RN: [birth year] (73 y.o. [name] Primary Care Provider: Chrisandra Netters Other Clinician: Referring Provider: Treating  Provider/Extender:Stone III, Anabel Bene, Ivan Anchors in Treatment: 42 Constitutional Well-nourished and well-hydrated in no acute distress. Respiratory normal breathing without difficulty. Psychiatric this patient is able to make decisions and demonstrates good insight into disease process. Alert and Oriented x 3. pleasant and cooperative. Notes Upon inspection patient's wound bed showed signs of good granulation at this time. Fortunately there is no evidence of active infection which is good news. With that being said there was some necrotic tissue on the surface of the wound which is good to require sharp debridement today that was performed without complication post debridement the wound bed appears to be doing much better which is great news. I do feel like she likely needs to go back to the surgical shoe at this time and then will see with Dr. Dellia Nims has to say at the next visit. Electronic Signature(s) Signed: 06/30/2019 5:02:14 PM By: Worthy Keeler PA-C Entered By: Worthy Keeler on 06/30/2019 13:13:21 -------------------------------------------------------------------------------- Physician Orders Details Patient Name: Date of Service: [name]. 06/30/2019 12:30 PM Medical Record [id number] Patient Account Number: [id number] Date of Birth/Sex: Treating RN: May 28, 1946 (73 y.o. [name] Primary Care Provider: Chrisandra Netters Other Clinician: Referring Provider: Treating Provider/Extender:Stone III, Anabel Bene, Ivan Anchors in Treatment: 55 Verbal / Phone Orders: No Diagnosis Coding ICD-10  Coding Code Description E11.42 Type 2 diabetes mellitus with diabetic polyneuropathy L97.518 Non-pressure chronic ulcer of other part of right foot with other specified severity L97.521 Non-pressure chronic ulcer of other part of left foot limited to breakdown of skin L02.612 Cutaneous abscess of left foot S80.812D Abrasion, left lower leg,  subsequent encounter Follow-up Appointments Return Appointment in 1 week. - Tuesday Dressing Change Frequency Wound #19 Left,Plantar Foot Other: - Change 2 times per week Skin Barriers/Peri-Wound Care Wound #19 Left,Plantar Foot Moisturizing lotion - to both legs Wound Cleansing Wound #19 Left,Plantar Foot Clean wound with Wound Cleanser Primary Wound Dressing Wound #19 Left,Plantar Foot Calcium Alginate with Silver Secondary Dressing Wound #19 Left,Plantar Foot Foam - donut Kerlix/Rolled Gauze Dry Gauze - or foam border Edema Control Wound #19 Left,Plantar Foot Avoid standing for long periods of time Elevate legs to the level of the heart or above for 30 minutes daily and/or when sitting, a frequency of: - throughout the day Support Garment 20-30 mm/Hg pressure to: - juxtalite to both legs daily Off-Loading Open toe surgical shoe to: - left foot with felt. [facility] for wound care. Lajean Manes Electronic Signature(s) Signed: 06/30/2019 5:02:14 PM By: Worthy Keeler PA-C Signed: 06/30/2019 5:49:26 PM By: Baruch Gouty RN, BSN Entered By: Baruch Gouty on 06/30/2019 13:13:27 -------------------------------------------------------------------------------- Problem List Details Patient Name: Date of Service: [name]. 06/30/2019 12:30 PM Medical Record [id number] Patient Account Number: [id number] Date of Birth/Sex: Treating RN: Dec 31, 1945 (73 y.o. [name] Primary Care Provider: Chrisandra Netters Other Clinician: Referring Provider: Treating Provider/Extender:Stone III, Anabel Bene, Ivan Anchors in Treatment: 51 Active Problems ICD-10 Evaluated Encounter Code Description Active Date Today Diagnosis E11.42 Type 2 diabetes mellitus with diabetic polyneuropathy 07/03/2018 No Yes L97.518 Non-pressure chronic ulcer of other part of right foot 07/20/2018 No Yes with other specified severity L97.521  Non-pressure chronic ulcer of other part of left foot 08/15/2018 No Yes limited to breakdown of skin L02.612 Cutaneous abscess of left foot 07/20/2018 No Yes S80.812D Abrasion, left lower leg, subsequent encounter 05/17/2019 No Yes Inactive Problems ICD-10 Code Description Active Date Inactive Date L97.511 Non-pressure chronic ulcer of other part of right foot limited to 07/03/2018 07/03/2018 breakdown of skin E11.52 Type 2 diabetes mellitus with diabetic peripheral angiopathy 07/20/2018 07/20/2018 with gangrene Resolved Problems ICD-10 Code Description Active Date Resolved Date S81.811D Laceration without foreign body, right lower leg, subsequent 07/03/2018 07/03/2018 encounter L97.821 Non-pressure chronic ulcer of other part of left lower leg limited 07/03/2018 07/03/2018 to breakdown of skin L97.211 Non-pressure chronic ulcer of right calf limited to breakdown of 01/12/2019 01/12/2019 skin Electronic Signature(s) Signed: 06/30/2019 5:02:14 PM By: Worthy Keeler PA-C Entered By: Worthy Keeler on 06/30/2019 12:56:40 -------------------------------------------------------------------------------- Progress Note Details Patient Name: Date of Service: [name]. 06/30/2019 12:30 PM Medical Record [id number] Patient Account Number: [id number] Date of Birth/Sex: Treating RN: [birth year] (73 y.o. [name], [name] Primary Care Provider: Chrisandra Netters Other Clinician: Referring Provider: Treating Provider/Extender:Stone III, Anabel Bene, Ivan Anchors in Treatment: 51 Subjective Chief Complaint Information obtained from Patient Patients presents for treatment of an open diabetic ulcer to the left foot which she's had for about 2 weeks now 09/19/17; patient is here for review of blistered areas on the right leg o2 and on the left leg o1 which have been present for the last week 07/03/2018; patient comes back to clinic for review of wounds on her bilateral lower  extremities History of Present Illness (HPI) 73 year old patient who  is known to our practice from May of last year was fully worked up with a venous and arterial duplex study and was referred to the vascular surgeon for follow-up. The patient says she has seen the vascular surgeons and has an appointment back in April. No procedure was done. She has developed a blister on the left big toe and forefoot which has been fairly large and draining fluid and she self-referred herself to [place]. The patient was recently seen in the ER on 10/06/2016, and was treated with clindamycin and asked to see the podiatrist and PCP. She saw the podiatrist Dr. Felisa Bonier on 10/11/2016 who I understand did an x-ray but no report is available. Of note last year when I saw her, I had referred her to do Dr. Ruta Hinds -- he saw on 02/04/2016. he reviewed and noted that she had evidence of peripheral arterial disease and recommended a follow-up in 6 months for repeat noninvasive arterial exam. Wounds worsened he would consider an angiogram. He saw her back on 03/14/2016, and continued conservative treatment and asked her to come back for noninvasive studies in 6 months. 12/03/2016 -- was seen in the office on 11/28/2016 and note is made of the fact that venous duplex examination on 01/02/2016 showed no evidence of reflux. Did have evidence of peripheral arterial disease and was asked to follow- up in 6 months time. Recent data on 11/28/2016 showed right noncompressible with monophasic waveforms left noncompressible with monophasic waveforms. The thought was she has medial calcifications of the arteries and had bilateral toe brachial indices which were normal. Right toe brachial index was 0.74 and the left toe brachial index was 0.80 She would return for a vascular study in 6 months when her ABI would be checked again. 7/6-Patient is back at 1 week visit to the clinic, home health is attending to the wound once a week, we  have been using silver alginate, MRI scheduled this week on Thursday for the left foot. We are seeing her for the left foot plantar ulcer and the right great toe ulcer 12/24/2016 -- the left plantar heel had a large callus which came off during her evaluation and there was an open ulcer at the base ==== Old notes She was recently seen by her PCP for an ulcer of the left toe which was treated at the urgent care by giving her ciprofloxacin which she has been taking. She has uncontrolled diabetes and A1c was 13.7 done last week. Past medical history significant for poorly controlled diabetes mellitus type 2, obesity, osteopenia, GERD, diabetic retinopathy, diabetic neuropathy, reported arthritis, hypothyroidism, hypertension. No x-rays were done recently. 12/27/2015 -- had an x-ray of the left foot --IMPRESSION:Juxta-articular erosions at the IP joint great toe and diffusely in the MTP joints greatest at first, suggesting an inflammatory arthropathy such as rheumatoid arthritis or gout.While septic arthritis can cause juxta-articular erosions, the multiplicity of joints involved makes this less likely. Significant soft tissue swelling with single tiny questionable focus of soft tissue gas medial to the first metatarsal head. If patient has persistent symptoms or persistent clinical concern for osteomyelitis, recommend MR imaging of the LEFT foot (with and without contrast if renal function permits). 01/03/2016 -- the lower extremity venous duplex reflex evaluation showed no evidence of deep or superficial thrombus or reflux. The arterial study done and showed the resting right ABI is greater than 1.3 and the left ABI was greater than 1.3 indicating arterial medial calcification. The right TBI was less than 0.64 and  the left ABI was less than 0.64 both of which are abnormal. except for the left posterior tibial which is triphasic on the flow is biphasic. With the above results due to the fact  that she has diabetes mellitus and would recommend she sees the vascular surgeon to see if any further intervention or procedure needs to be planned. ====== READMISSION 09/19/17;this is a 73 year old woman that we've had in our clinic previously for wounds on predominantly her left foot left toe and left heel. She is a diabetic. She has had lower extremity workups for both venous reflux disease and arterial disease. She has known noncompressible vessels bilaterally however in May 2017 as noted above her TBI was 0.64 and the left TBI was less than 0.64. When she was last in clinicin May 2017 the left heel wound had closed. She was faithfully wearing her juxta light stockings. She tells me 2-3 weeks ago she developed increasing edema in her legs. She was seen by Dr. Valentino Saxon of nephrology who apparently increased her Lasix from 40-80 mg twice a day. The patient has not noticed any improvement in the edema. She does not weigh herself. About a week ago she noted blisters on her legs on the right o2 on the left o1 since then she has not been wearing the juxta light stockings. She has not had any pain no shortness of breath 09/26/17;; the patient has a stable wound as a result of blistering on the left anterior calf and 2 on the right anterior and right medial. There is another denuded blister on the left anterior more superior. This does not have any current fluid I did not remove the skin today. We've been using silver alginate under compression 10/03/17; the patient's right leg is healed. The area on the left anterior leg all wound sites look better. We've been using silver alginate under compression. In questioning the patient apparently has a juxta light stockings however she has not been using these. We asked her to find them lubricate her skin every night and used a juxta light stocking on the right. I would like to see this next time 10/10/17; both the patient's areas on the left anterior leg  o2 are closed the area on the look right posterior medial calf remains closed. She is been using a juxta light stocking on the right she has one for the left Readmission Mrs. Joya Gaskins is now a 73 year old woman we have had in this clinic at least 3 times before. Most recently she was here in February 2019 for 2 visits with wounds on her bilateral lower extremities because of blisters these healed fairly quickly and we discharged her in juxta lite stockings. She was previously here in 2018 with a wound on her left foot and in 2017 she was sent to see Dr. Oneida Alar for follow-up of PAD. Indeed she seems to have had follow-up noninvasive vascular studies every 6 months. Her current problems began at the end of October she apparently suffered a fall with a laceration on her right anterior leg. She had this sutured on 06/23/2018. Sometime in the same timeframe she developed blistering over a large area of the left anterior calf, the right great toe. The blisters have spontaneously ruptured and she has large areas of the epithelial loss. She also has non-open blisters on the medial part of the left great toe. There is also an open area on the right second toe. I do not believe that she was using her compression stockings [not  juxta lites] because the swelling in her legs had gotten too large for her to wrap. She tells me she is recently been to her nephrologist and had her Lasix increased which is helped somewhat with the swelling. The patient did not have arterial studies done in our clinic. Her most recent noninvasive studies were in August. These showed ABIs noncompressible bilaterally. She had TBI's on the right at 0.54 and on the left at 0.60 biphasic waveforms at the PTA and DP bilaterally. She was felt to have bilateral ABIs that were unchanged from her previous study in October 2018. However it was noted her TBI's were decreased. Past medical history includes type 2 diabetes with peripheral  neuropathy with a recent hemoglobin A1c of 7. She is apparently off her diabetes medications, she has stage III chronic renal failure, hyperlipidemia, hypertension, cataracts, retinopathy, rheumatoid arthritis, iron deficiency and apparently is receiving IV iron, urge incontinence 07/10/2018; she arrives today with everything looking quite a bit better. The large area of blistered denuded epithelium on the left calf has healed over. The area on the right lateral tibial area is also mostly healed still with a linear open area where her laceration was. The area on her left medial toe is just about healed. She has a dark thick black eschar over the tip of her right first toe. I do not remember this from last week this feels almost like the eschar you would see with ischemia yet her toes are warm and her peripheral pulses are palpable. She does have some degree of PAD but I do not think that was sufficient enough to have caused this. 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 Patient originally came here with a laceration injury of her right lateral mid calf. She also had an area of denuded skin on the left which is since closed over. She had a small area on the tip of her right great toe as well. She arrives today with what looks to be dry gangrene on the right great toe as well as discoloration of the tips of her second and third toes. She is not in any pain. She also had purulent drainage coming out open area on her left midfoot which was new this week. Culture was obtained The patient is known to vascular surgery having last been seen on 04/02/2018. They follow her for chronic arterial insufficiency. Last arterial studies were in August 2017 at which time her ABIs were noncompressible however her TBI was 0.54 on the right and 0.60 on the left. She was noted to have bilateral ankle arteries remain calcified. Waveforms biphasic. Slight decline in bilateral  TBI's 07/24/2018; the patient was graciously seen urgently by vein and vascular with regards to critical limb ischemia, she had a CO2 angiogram and then contrast to look at the lower extremity arteries. Surprisingly no significant vascular disease was noted in the aortic iliac set segment, patent common femoral and profunda arteries patent popliteal and three-vessel runoff with all 3 of the tibials present. Her dorsalis pedis and plantar arch vessels also filled. Notable for the fact that it was felt that she had small vessel disease near her toes but this was not amenable for intervention. The abscess that I unroofed last week on the plantar left foot grew Stenotrophomonas Maltophilia. This is not a usual true wound pathogen. However it had abundant organisms and given the fact that is an abscess I went ahead and treated this with Levaquin 250  daily for 7 days. I had her on doxycycline last week 07/31/2018; I put 3 layer compression on the right leg last week. She arrives in today with almost no edema in the right leg and out of concern for the this in the gangrenous toes I am going to reduce this back to Kerlix and Coban which is what we had her on at the beginning. We are using silver alginate to the traumatic wound on her right anterior leg and this looks a lot better. The area on the left foot is healed and the patient is using her own wraparound compression stocking here. She still has the dry gangrene-like changes predominantly involving the right first toe, tip of her right second and third toes. We are using Betadine here. The exact etiology of this was not completely clear 08/07/18; we've been using 2 layer compression on the right leg. She still has the gangrenous toes for second and third on the right and the original laceration injury. She does not have any significant vascular issues to the level of the ankle. She does have small vessel disease in her toes.The left mid foot wound is  closed 08/14/18; the patient arrives in clinic today with improvement in the original area on her right lateral leg. The 3 dry gangrenous toes first second and third on the right all look stable except for the second. This was obviously separating. It was removed fortunately there is healthy-looking surface tissue underneath this. ooShe arrives in clinic with a reopening on the left midfoot. This was previously a small abscess that had closed. She has Charcot feet bilaterally 08/24/2018; right lateral leg wound is smaller. The 3 dry gangrenous first second and third toes are a bit changed. The third toe was separating I remove the eschar and subcutaneous tissue to reveal a small open area at the tip of the toe just at the base at the head of the nail. The first toe is unchanged. ooLeft plantar foot is larger. This is in the Charcot foot area. I removed nonviable circumference and surface from the wound using silver alginate 08/31/2018; right lateral leg wound continues to contract also the area on her plantar foot. First great toe is beginning to separate and the nail will soon I think fall off.. She sees vein and vascular tomorrow. 1/20; right lateral leg wound continues to contract and is almost closed first great toe is beginning to separate. The area on her left plantar foot still has rolled edges around the wound and some debris in the surface but generally smaller. We have been using silver alginate to the 2 remaining wound areas and Betadine to the left great toe. The patient saw vein and vascular on 1/14. They did not add anything here. Noted that the patient has microvascular disease but no major macrovascular issues. No intervention was performed during her CO2 aortogram on 07/23/2018. 2/3; 2-week follow-up. Right leg wound which is her original wound and coming here is healed. Second and third right toes have healed. She still has dry eschared/dry gangrene over the right first toe. I  thought this might begin to separate but it really is not making great headway with that. The area on the left midfoot reopened 2 to 3 weeks ago and this is made no progress. Finally she has new blisters on the left first toe laterally and the left second toe dorsally over the DIP at the base of the nail 2/17; 2-week follow-up. Right leg wound which is her original  wound is healed. Right second and third toes are still healed. She still has the black ischemic eschar over the first toe which has not really begun to separate. The area on the left midfoot is unfortunately worse with large amounts of denuded skin around this. She has blisters on the medial part of her first toe and the dorsal part of the second toe on the left 2/24; she still has the black ischemic eschar over the first toe which is not really separating. The area on the left midfoot seems stable from last week. The blisters on the medial part of her first toe and dorsal second toe had hardened and I removed both of these there are small open areas underneath. 3/2; left first and second toes are healed. The area on the left plantar foot looks better. Finally the right great toe area is beginning to separate. 3/9; left first and second toes remain healed. Left plantar foot continues to look excellent smaller with a healthy base. Finally the right great toe had separated further. I went ahead and remove the eschar here. There is still a fairly sizable wound here but this will give [place] a better chance to address this. 3/16-Left first and second toes mostly healed although the left first toe appears to have an open area Right great toe deep ulcer with exposed bone, greenish debris with necrotic tissue, right second toe wound with clean edges and base. Patient had silver alginate dressing to all the toes especially on the right. SHe has been offloading with inserts and open toe shoes 3/23; since I last saw this patient 2 weeks ago there  is been quite a bit of deterioration. The left plantar foot continues to look about the same. She now has an area on the left lateral first toe. On the right foot the first toe has a fair amount of exposed bone. X-ray that we did last week did not show osteomyelitis nevertheless that would have to be a concern. She also has an area on the tip of the right second toe that is new 3/30; some improvement in the left plantar foot. Bone I took out of the right first toe last week did not show osteomyelitis under pathology however culture did grow rare staph aureus and rare Citrobacter. We have been using silver alginate to all her wounds 4/6; left plantar foot wound about the same although this does not look ominous. The right first toe which is been very problematic does not have exposed bone aggressive debridement of the surface of this. She does have grams of epithelialization. We have been using silver alginate on both wounds 4/13; left plantar foot wound about the same. Removed skin and subcutaneous tissue from the wound circumference. ooThe right great toe has underlying osteomyelitis I have extended her Levaquin 500 mg for another 2 weeks. She does not complain of any overt side effects. 4/23;. The patient has wounds on the left plantar foot, left lateral great toe and a new area on the dorsal part of the right second toe. Her large area over the tip of her right great toe. She is on Levaquin I will need to check on when we want to finish this. This is for underlying osteomyelitis of the right great toe 5/4; the patient has or should be completing her Levaquin at this point. This is for osteomyelitis in the right great toe. She also has wounds on her left plantar foot and the dorsal part of her right second toe.  We have been using silver alginate 5/11; patient arrives today with her right great toe generally looking better. However the area on the plantar left foot had considerable undermining  and discomfort around the wound and some erythema. We have been using silver alginate 5/18-Patient returns at 1 week the right great toe is much more macerated around the edges of the wound, the left foot wounds are considerably better especially the left great toe and left second toe. We have been using silver alginate to all wounds and juxta lite to the right leg and 2 layer compression to the left. THere is now a fluid filled bleb on the posterior calf of RLE 5/26; right great toe continues to improve. The underlying osteomyelitis was treated empirically in this clinic. ooLeft midfoot wound also looks better than the last time I saw this 2 weeks ago. ooApparently last week she had a blister on the right posterior calf that was largely. This was not specifically addressed. It is open since last time she has a large area on the right posterior mid calf with skin attached to a large amount of the circumference. 6/1; we continue to have true improvements in both of the wound areas on the right great toe and left plantar foot. The large blister on the posterior right calf is just about fully epithelialized although there is still some weeping edema in this area 6/8; we continued to have been improvements in both areas currently on the right great toe and left plantar foot. The large blister on the posterior right calf is fully healed. 6/15; we we continue to have improvements in the right great toe. The left plantar foot which was a small wound without undermining last week has decided to expand into the lateral foot. There is some swelling in this area. She is not systemically unwell 6/22; the culture of her left foot that I did last week grew Enterobacter I had her on doxycycline. There was also clearly some purulent drainage coming from this today through the original wound with the swelling on the lateral part of her foot. She is not systemically unwell. 6/25 the patient is on Levaquin for  the Enterobacter cultured out of her left foot. She now has an extensive wound area in the medial left foot we are using silver alginate The patient complains of pain in the foot going up towards the metatarsal head. There is also swelling inferiorly to this area that we identified last week but this is not tender 6/29; the patient is completing her Levaquin for the Enterobacter. We are using silver alginate to the extensive wound on the left plantar foot as well as to the right great toe 7/14; the right great toe is healed today. The area on the left foot looks better. The MRI of the left foot did not show osteomyelitis in the area of the wound however it did show an effusion in the tibiotalar joint. The patient has rheumatoid arthritis 7/20; right great toe remains healed. The area on the left foot continues to look improved. 7/27 right great toe remains healed. The area on the left foot continues to contract. Healthy granulation tissue she is only offloading this in surgical shoes. 03/24/2019 on evaluation today patient appears to be doing very well with regard to her plantar foot ulcer. She has been tolerating the dressing changes without complication. She has a healthy granulation surface. There is no signs of active infection at this time and her swelling seems to be under  good control. 8/17; the areas on the left medial midfoot. Probably subluxed bone in this area. Wounds are superficial. It looks as though there was some blistering skin at one point 8/24; the area on the left medial midfoot. This is completely resolved at this point although it still looks somewhat vulnerable. She probably has subluxed bone underneath this area. She has pes planus deformity 9/14; apparently this wound reopened in the 3 weeks since I saw her. She was seen last week she is given new healing sandals. Been using calcium alginate with felt offloading. This is not surprising given the area looks somewhat  vulnerable. 9/28; left plantar foot in the setting of diabetic neuropathy probably some degree of Charcot deformity. She also has abrasions on the left leg which she says are due to the Curlex co-band that was put on by home health. She is also having a lot of pain in the left calf finds the wrap too tight 10/5; left plantar foot has 2 superficial open areas. The abrasions on the left leg from last week are healed. She still has some swelling and tenderness however. Duplex ultrasound of the leg tomorrow tomorrow. 10/13; patient arrived with a deterioration in the left foot from last week. Undermining wound probably connecting the 2 superficial areas from last week. She wears a surgical shoe with felt offloading. Duplex ultrasound she had last week showed a ruptured Baker's cyst. Her leg looks a lot better 10/20; 2 remanent open areas in the left foot. This looks better than last time. We are using silver alginate. She wears a surgical shoe for offloading there is not a more aggressive option 10/26; the patient does not have any open on the left foot we are using silver alginate to the medial aspect of the foot. She wears a surgical shoe on both feet. Juxta lite stocking on the right, she has 1 for the left. This is been a long haul for this patient. Initially coming in with a wound on her right lateral calf. Then her right great toe. More recently areas on the left medial foot 06/30/2019 on evaluation today this is a patient that I regularly do not see but unfortunately she has a wound on the plantar aspect of her foot which I have seen before that appeared to likely be healed last week although it reopened before she even got to the end of the week. This is mainly on the plantar aspect of her foot. She is really not been able to find any sandals that Dr. Dellia Nims was talking to her about as far as going forward what she should be wearing. Fortunately there is no evidence of active infection at this  time which is good news. No fevers, chills, nausea, vomiting, or diarrhea. Patient History Information obtained from Patient. Family History Cancer - Mother,Siblings, Diabetes - Mother, Heart Disease - Mother, Hypertension - Mother, Lung Disease - Father, No family history of Hereditary Spherocytosis, Kidney Disease, Seizures, Stroke, Thyroid Problems, Tuberculosis. Social History Never smoker, Marital Status - Widowed, Alcohol Use - Never, Drug Use - No History, Caffeine Use - Rarely. Medical History Eyes Patient has history of Cataracts - removed on left eye Denies history of Glaucoma, Optic Neuritis Ear/Nose/Mouth/Throat Denies history of Chronic sinus problems/congestion, Middle ear problems Hematologic/Lymphatic Denies history of Anemia, Hemophilia, Human Immunodeficiency Virus, Lymphedema, Sickle Cell Disease Respiratory Denies history of Aspiration, Asthma, Chronic Obstructive Pulmonary Disease (COPD), Pneumothorax, Sleep Apnea, Tuberculosis Cardiovascular Patient has history of Hypertension - HCTZ, Denies history of  Angina, Arrhythmia, Congestive Heart Failure, Coronary Artery Disease, Deep Vein Thrombosis, Hypotension, Myocardial Infarction, Peripheral Arterial Disease, Peripheral Venous Disease, Phlebitis, Vasculitis Gastrointestinal Denies history of Cirrhosis , Colitis, Crohnoos, Hepatitis A, Hepatitis B, Hepatitis C Endocrine Patient has history of Type II Diabetes - Since age 70 Denies history of Type I Diabetes Genitourinary Denies history of End Stage Renal Disease Immunological Denies history of Lupus Erythematosus, Raynaudoos, Scleroderma Integumentary (Skin) Denies history of History of Burn Musculoskeletal Patient has history of Rheumatoid Arthritis - 20 years Denies history of Gout, Osteoarthritis, Osteomyelitis Neurologic Denies history of Dementia, Neuropathy, Quadriplegia, Paraplegia, Seizure Disorder Oncologic Denies history of Received  Chemotherapy, Received Radiation Psychiatric Denies history of Anorexia/bulimia, Confinement Anxiety Medical And Surgical History Notes Endocrine hypothyroid Psychiatric Schizotypal personality disorder Review of Systems (ROS) Constitutional Symptoms (General Health) Denies complaints or symptoms of Fatigue, Fever, Chills, Marked Weight Change. Respiratory Denies complaints or symptoms of Chronic or frequent coughs, Shortness of Breath. Cardiovascular Denies complaints or symptoms of Chest pain. Psychiatric Denies complaints or symptoms of Claustrophobia, Suicidal. Objective Constitutional Well-nourished and well-hydrated in no acute distress. Vitals Time Taken: 12:40 PM, Height: 62 in, Weight: 140 lbs, BMI: 25.6, Temperature: 97.9 F, Pulse: 82 bpm, Respiratory Rate: 18 breaths/min, Blood Pressure: 143/66 mmHg. Respiratory normal breathing without difficulty. Psychiatric this patient is able to make decisions and demonstrates good insight into disease process. Alert and Oriented x 3. pleasant and cooperative. General Notes: Upon inspection patient's wound bed showed signs of good granulation at this time. Fortunately there is no evidence of active infection which is good news. With that being said there was some necrotic tissue on the surface of the wound which is good to require sharp debridement today that was performed without complication post debridement the wound bed appears to be doing much better which is great news. I do feel like she likely needs to go back to the surgical shoe at this time and then will see with Dr. Dellia Nims has to say at the next visit. Integumentary (Hair, Skin) Wound #19 status is Open. Original cause of wound was Gradually Appeared. The wound is located on the [place]. The wound measures 0.6cm length x 2.7cm width x 0.2cm depth; 1.272cm^2 area and 0.254cm^3 volume. There is Fat Layer (Subcutaneous Tissue) Exposed exposed. There is no  tunneling or undermining noted. There is a medium amount of serosanguineous drainage noted. The wound margin is flat and intact. There is small (1-33%) pink granulation within the wound bed. There is a large (67-100%) amount of necrotic tissue within the wound bed including Adherent Slough. 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 There was a Excisional Skin/Subcutaneous Tissue Debridement with a total area of 0.8 sq cm performed by Worthy Keeler, PA. With the following instrument(s): Curette to remove Viable and Non-Viable tissue/material. Material removed includes Subcutaneous Tissue, Slough, and Skin: Epidermis after achieving pain control using Other (benzocaine 20% spray). No specimens were taken. A time out was conducted at 13:00, prior to the start of the procedure. A Minimum amount of bleeding was controlled with Pressure. The procedure was  tolerated well with a pain level of 0 throughout and a pain level of 0 following the procedure. Post Debridement Measurements: 0.6cm length x 1cm width x 0.1cm depth; 0.047cm^3 volume. Character of Wound/Ulcer Post Debridement is improved. Severity of Tissue Post Debridement is: Fat layer exposed. Post procedure Diagnosis Wound #19: Same as Pre-Procedure Plan Follow-up Appointments: Return Appointment in 1 week. - Tuesday Dressing Change Frequency: Wound #19 Left,Plantar Foot: Other: - Change 2 times per week Skin Barriers/Peri-Wound Care: Wound #19  Left,Plantar Foot: Moisturizing lotion - to both legs Wound Cleansing: Wound #19 Left,Plantar Foot: Clean wound with Wound Cleanser Primary Wound Dressing: Wound #19 Left,Plantar Foot: Calcium Alginate with Silver Secondary Dressing: Wound #19 Left,Plantar Foot: Foam - donut Kerlix/Rolled Gauze Dry Gauze - or foam border Edema Control: Wound #19 Left,Plantar Foot: Avoid standing for long periods of time Elevate legs to the level of the heart or above for 30 minutes daily and/or when sitting, a frequency of: - throughout the day Support Garment 20-30 mm/Hg pressure to: - juxtalite to both legs daily Off-Loading: Open toe surgical shoe to: - left foot with felt. Home Health: [facility] for wound care. - [facility]. I would recommend currently that we switch back to the silver alginate dressing as well as utilizing the surgical shoe which she did well with previous. 2. I am also going to suggest at this point that we go ahead and use the foam doughnut in order to help offload the wound itself on the plantar aspect of her foot. 3. As far as ongoing she probably needs to get either a custom molded shoe or else potentially Dr. Dellia Nims was discussing with her some kind of sandal although I am not exactly sure what he had in mind or if it was anything very specific. We will wait and see what he has to say next week. With that being said for the time being we will continue to use the surgical shoe for offloading. We will see patient back for reevaluation in 1 week here in the clinic. If anything worsens or changes patient will contact our office for additional recommendations. Electronic Signature(s) Signed: 06/30/2019 5:02:14 PM By: Worthy Keeler PA-C Entered By: Worthy Keeler on 06/30/2019 13:14:17 -------------------------------------------------------------------------------- HxROS Details Patient Name: Date of Service: [name]. 06/30/2019 12:30  PM Medical Record [id number] Patient Account Number: [id number] Date of Birth/Sex: Treating RN: [birth year] (73 y.o. [name] Primary Care Provider: Chrisandra Netters Other Clinician: Referring Provider: Treating Provider/Extender:Stone III, Anabel Bene, Ivan Anchors in Treatment: 51 Information Obtained From Patient Constitutional Symptoms (General Health) Complaints and Symptoms: Negative for: Fatigue; Fever; Chills; Marked Weight Change Respiratory Complaints and Symptoms: Negative for: Chronic or frequent coughs; Shortness of Breath Medical History: Negative for: Aspiration; Asthma; Chronic Obstructive Pulmonary Disease (COPD); Pneumothorax; Sleep Apnea; Tuberculosis Cardiovascular Complaints and Symptoms: Negative for: Chest pain Medical History: Positive for: Hypertension - HCTZ, Negative for: Angina; Arrhythmia; Congestive Heart Failure; Coronary Artery Disease; Deep Vein Thrombosis; Hypotension; Myocardial Infarction; Peripheral Arterial Disease; Peripheral Venous Disease; Phlebitis; Vasculitis Psychiatric Complaints and Symptoms: Negative for: Claustrophobia; Suicidal Medical History: Negative for: Anorexia/bulimia; Confinement Anxiety Past Medical History Notes: Schizotypal personality disorder Eyes Medical History: Positive for: Cataracts - removed on left eye Negative for: Glaucoma; Optic Neuritis Ear/Nose/Mouth/Throat Medical History: Negative for: Chronic sinus problems/congestion; Middle ear problems Hematologic/Lymphatic Medical History: Negative for: Anemia; Hemophilia; Human Immunodeficiency Virus; Lymphedema; Sickle Cell Disease Gastrointestinal Medical History: Negative for: Cirrhosis ; Colitis; Crohns; Hepatitis A; Hepatitis  B; Hepatitis C Endocrine Medical History: Positive for: Type II Diabetes - Since age 25 Negative for: Type I Diabetes Past Medical History Notes: hypothyroid Time with diabetes: 52 Treated with:  Diet Blood sugar tested every day: No Genitourinary Medical History: Negative for: End Stage Renal Disease Immunological Medical History: Negative for: Lupus Erythematosus; Raynauds; Scleroderma Integumentary (Skin) Medical History: Negative for: History of Burn Musculoskeletal Medical History: Positive for: Rheumatoid Arthritis - 20 years Negative for: Gout; Osteoarthritis; Osteomyelitis Neurologic Medical History: Negative for: Dementia; Neuropathy; Quadriplegia; Paraplegia; Seizure Disorder Oncologic Medical History: Negative for: Received Chemotherapy; Received Radiation HBO Extended History Items Eyes: Cataracts Immunizations Pneumococcal Vaccine: Received Pneumococcal Vaccination: Yes Tetanus Vaccine: Last tetanus shot: 10/22/2004 Immunization Notes: doesn't remember when last tetanus Implantable Devices No devices added Family and Social History Cancer: Yes - Mother,Siblings; Diabetes: Yes - Mother; Heart Disease: Yes - Mother; Hereditary Spherocytosis: No; Hypertension: Yes - Mother; Kidney Disease: No; Lung Disease: Yes - Father; Seizures: No; Stroke: No; Thyroid Problems: No; Tuberculosis: No; Never smoker; Marital Status - Widowed; Alcohol Use: Never; Drug Use: No History; Caffeine Use: Rarely; Financial Concerns: Yes; Food, Clothing or Shelter Needs: No; Support System Lacking: No; Transportation Concerns: No Physician Affirmation I have reviewed and agree with the above information. Electronic Signature(s) Signed: 06/30/2019 5:02:14 PM By: Worthy Keeler PA-C Signed: 06/30/2019 5:49:26 PM By: Baruch Gouty RN, BSN Entered By: Worthy Keeler on 06/30/2019 13:12:57 -------------------------------------------------------------------------------- SuperBill Details Patient Name: Date of Service: [name] 06/30/2019 Medical Record [id number] Patient Account Number: [id number] Date of Birth/Sex: Treating RN: April 26, 1946 (73 y.o. [name] Primary Care Provider: Chrisandra Netters Other Clinician: Referring Provider: Treating Provider/Extender:Stone III, Anabel Bene, Ivan Anchors in Treatment: 51 Diagnosis Coding ICD-10 Codes Code Description E11.42 Type 2 diabetes mellitus with diabetic polyneuropathy L97.518 Non-pressure chronic ulcer of other part of right foot with other specified severity L97.521 Non-pressure chronic ulcer of other part of left foot limited to breakdown of skin L02.612 Cutaneous abscess of left foot S80.812D Abrasion, left lower leg, subsequent encounter Facility Procedures The patient participates with Medicare or their insurance follows the Medicare Facility Guidelines: CPT4 Code Description Modifier Quantity [id number] 11042 - DEB SUBQ TISSUE 20 SQ CM/< 1 ICD-10 Diagnosis Description L97.521 Non-pressure chronic ulcer of  other part of left foot limited to breakdown of skin Physician Procedures CPT4 Code Description: [id number] 11042 - WC PHYS SUBQ TISS 20 SQ CM ICD-10 Diagnosis Description L97.521 Non-pressure chronic ulcer of other part of left foot limi Modifier: ted to breakdo Quantity: 1 wn of skin Electronic Signature(s) Signed: 06/30/2019 5:02:14 PM By: Worthy Keeler PA-C Entered By: Worthy Keeler on 06/30/2019 13:15:31

## 2019-07-01 DIAGNOSIS — H2511 Age-related nuclear cataract, right eye: Secondary | ICD-10-CM | POA: Diagnosis not present

## 2019-07-01 DIAGNOSIS — H25011 Cortical age-related cataract, right eye: Secondary | ICD-10-CM | POA: Diagnosis not present

## 2019-07-01 DIAGNOSIS — H25041 Posterior subcapsular polar age-related cataract, right eye: Secondary | ICD-10-CM | POA: Diagnosis not present

## 2019-07-01 DIAGNOSIS — H25811 Combined forms of age-related cataract, right eye: Secondary | ICD-10-CM | POA: Diagnosis not present

## 2019-07-02 ENCOUNTER — Encounter (HOSPITAL_COMMUNITY): Payer: Medicare Other

## 2019-07-05 DIAGNOSIS — I10 Essential (primary) hypertension: Secondary | ICD-10-CM | POA: Diagnosis not present

## 2019-07-05 DIAGNOSIS — L97421 Non-pressure chronic ulcer of left heel and midfoot limited to breakdown of skin: Secondary | ICD-10-CM | POA: Diagnosis not present

## 2019-07-05 DIAGNOSIS — E1142 Type 2 diabetes mellitus with diabetic polyneuropathy: Secondary | ICD-10-CM | POA: Diagnosis not present

## 2019-07-05 DIAGNOSIS — E039 Hypothyroidism, unspecified: Secondary | ICD-10-CM | POA: Diagnosis not present

## 2019-07-05 DIAGNOSIS — E11621 Type 2 diabetes mellitus with foot ulcer: Secondary | ICD-10-CM | POA: Diagnosis not present

## 2019-07-07 ENCOUNTER — Encounter (HOSPITAL_BASED_OUTPATIENT_CLINIC_OR_DEPARTMENT_OTHER): Payer: Medicare Other | Admitting: Physician Assistant

## 2019-07-07 ENCOUNTER — Other Ambulatory Visit: Payer: Self-pay

## 2019-07-07 DIAGNOSIS — S80812D Abrasion, left lower leg, subsequent encounter: Secondary | ICD-10-CM | POA: Diagnosis not present

## 2019-07-07 DIAGNOSIS — E11319 Type 2 diabetes mellitus with unspecified diabetic retinopathy without macular edema: Secondary | ICD-10-CM | POA: Diagnosis not present

## 2019-07-07 DIAGNOSIS — E1151 Type 2 diabetes mellitus with diabetic peripheral angiopathy without gangrene: Secondary | ICD-10-CM | POA: Diagnosis not present

## 2019-07-07 DIAGNOSIS — E1142 Type 2 diabetes mellitus with diabetic polyneuropathy: Secondary | ICD-10-CM | POA: Diagnosis not present

## 2019-07-07 DIAGNOSIS — L97522 Non-pressure chronic ulcer of other part of left foot with fat layer exposed: Secondary | ICD-10-CM | POA: Diagnosis not present

## 2019-07-07 DIAGNOSIS — E785 Hyperlipidemia, unspecified: Secondary | ICD-10-CM | POA: Diagnosis not present

## 2019-07-07 DIAGNOSIS — N183 Chronic kidney disease, stage 3 unspecified: Secondary | ICD-10-CM | POA: Diagnosis not present

## 2019-07-07 DIAGNOSIS — L02612 Cutaneous abscess of left foot: Secondary | ICD-10-CM | POA: Diagnosis not present

## 2019-07-07 DIAGNOSIS — E11621 Type 2 diabetes mellitus with foot ulcer: Secondary | ICD-10-CM | POA: Diagnosis not present

## 2019-07-07 DIAGNOSIS — L97518 Non-pressure chronic ulcer of other part of right foot with other specified severity: Secondary | ICD-10-CM | POA: Diagnosis not present

## 2019-07-07 DIAGNOSIS — L97521 Non-pressure chronic ulcer of other part of left foot limited to breakdown of skin: Secondary | ICD-10-CM | POA: Diagnosis not present

## 2019-07-07 DIAGNOSIS — E039 Hypothyroidism, unspecified: Secondary | ICD-10-CM | POA: Diagnosis not present

## 2019-07-07 DIAGNOSIS — M069 Rheumatoid arthritis, unspecified: Secondary | ICD-10-CM | POA: Diagnosis not present

## 2019-07-07 DIAGNOSIS — I129 Hypertensive chronic kidney disease with stage 1 through stage 4 chronic kidney disease, or unspecified chronic kidney disease: Secondary | ICD-10-CM | POA: Diagnosis not present

## 2019-07-07 DIAGNOSIS — E1122 Type 2 diabetes mellitus with diabetic chronic kidney disease: Secondary | ICD-10-CM | POA: Diagnosis not present

## 2019-07-07 NOTE — Progress Notes (Addendum)
LORIA, LACINA ([id number]) Visit Report for 07/07/2019 Arrival Information Details Patient Name: Date of Service: [name], [name] 07/07/2019 12:30 PM Medical Record [id number] Patient Account Number: [id number] Date of Birth/Sex: Treating RN: Nov 13, 1945 (73 y.o. [name], Tammi Klippel Primary Care Itzae Miralles: Chrisandra Netters Other Clinician: Referring Leelan Rajewski: Treating Boe Deans/Extender:Stone III, Anabel Bene, Ivan Anchors in Treatment: 52 Visit Information History Since Last Visit Added or deleted any medications: No Patient Arrived: Walker Any new allergies or adverse reactions: No Arrival Time: 13:11 Had a fall or experienced change in No Accompanied By: self activities of daily living that may affect Transfer Assistance: None risk of falls: Patient Identification Verified: Yes Signs or symptoms of abuse/neglect since last No Secondary Verification Process Yes visito Completed: Hospitalized since last visit: No Patient Requires Transmission- No Implantable device outside of the clinic excluding No Based Precautions: cellular tissue based products placed in the center Patient Has Alerts: Yes since last visit: Patient Alerts: right non Has Dressing in Place as Prescribed: Yes compressable Has Compression in Place as Prescribed: Yes Pain Present Now: No Electronic Signature(s) Signed: 07/07/2019 5:18:27 PM By: Deon Pilling Entered By: Deon Pilling on 07/07/2019 13:18:16 -------------------------------------------------------------------------------- Encounter Discharge Information Details Patient Name: Date of Service: [name]. 07/07/2019 12:30 PM Medical Record [id number] Patient Account Number: [id number] Date of Birth/Sex: Treating RN: June 23, 1946 (73 y.o. [name], Tammi Klippel Primary Care Aireanna Luellen: Chrisandra Netters Other Clinician: Referring Lorely Bubb: Treating Makiyla Linch/Extender:Stone III, Anabel Bene, Ivan Anchors in Treatment:  52 Encounter Discharge Information Items Post Procedure Vitals Discharge Condition: Stable Temperature (F): 98.1 Ambulatory Status: Walker Pulse (bpm): 79 Discharge Destination: Home Respiratory Rate (breaths/min): 18 Transportation: Private Auto Blood Pressure (mmHg): 141/76 Accompanied By: self Schedule Follow-up Appointment: Yes Clinical Summary of Care: Electronic Signature(s) Signed: 07/07/2019 5:18:27 PM By: Deon Pilling Entered By: Deon Pilling on 07/07/2019 14:01:51 -------------------------------------------------------------------------------- Lower Extremity Assessment Details Patient Name: Date of Service: [name], [name] 07/07/2019 12:30 PM Medical Record [id number] Patient Account Number: [id number] Date of Birth/Sex: Treating RN: Jun 24, 1946 (73 y.o. [name] Primary Care [place] Wenk: Chrisandra Netters Other Clinician: Referring Jordanna Hendrie: Treating Caroll Cunnington/Extender:Stone III, Anabel Bene, Ivan Anchors in Treatment: 52 Edema Assessment Assessed: [Left: Yes] [Right: No] Edema: [Left: Yes] [Right: Yes] Calf Left: Right: Point of Measurement: 31 cm From Medial Instep 29 cm cm Ankle Left: Right: Point of Measurement: 11.5 cm From Medial Instep 22 cm cm Vascular Assessment Pulses: Dorsalis Pedis Palpable: [Left:Yes] Electronic Signature(s) Signed: 07/07/2019 5:18:27 PM By: Deon Pilling Entered By: Deon Pilling on 07/07/2019 13:18:49 -------------------------------------------------------------------------------- Multi-Disciplinary Care Plan Details Patient Name: Date of Service: [name]. 07/07/2019 12:30 PM Medical Record [id number] Patient Account Number: [id number] Date of Birth/Sex: Treating RN: 1946-04-01 (73 y.o. [name] Primary Care Jenasis Straley: Other Clinician: Chrisandra Netters Referring Mayre Bury: Treating Alazne Quant/Extender:Stone III, Anabel Bene, Ivan Anchors in Treatment: 52 Active  Inactive Wound/Skin Impairment Nursing Diagnoses: Impaired tissue integrity Knowledge deficit related to ulceration/compromised skin integrity Goals: Patient/caregiver will verbalize understanding of skin care regimen Date Initiated: 07/03/2018 Target Resolution Date: 07/16/2019 Goal Status: Active Ulcer/skin breakdown will have a volume reduction of 30% by week 4 Target Resolution Date Initiated: 07/03/2018 Date Inactivated: 07/31/2018 Date: 07/31/2018 Unmet Reason: necrotic Goal Status: Unmet toes, had vascular procedure Ulcer/skin breakdown will have a volume reduction of 50% by week 8 Date Initiated: 07/31/2018 Date Inactivated: 08/31/2018 Target Resolution Date: 09/04/2018 Goal Status: Met Ulcer/skin breakdown will have a volume reduction of 80% by week 12 Date Initiated: 08/31/2018 Date Inactivated: 10/05/2018 Target Resolution Date: 10/02/2018 Goal Status: Unmet  Unmet Reason: PAD Interventions: Assess patient/caregiver ability to obtain necessary supplies Assess patient/caregiver ability to perform ulcer/skin care regimen upon admission and as needed Assess ulceration(s) every visit Provide education on ulcer and skin care Treatment Activities: Patient referred to home care : 07/03/2018 Skin care regimen initiated : 07/03/2018 Topical wound management initiated : 07/03/2018 Notes: Electronic Signature(s) Signed: 07/07/2019 5:42:21 PM By: Baruch Gouty RN, BSN Entered By: Baruch Gouty on 07/07/2019 13:31:33 -------------------------------------------------------------------------------- Pain Assessment Details Patient Name: Date of Service: [name] 07/07/2019 12:30 PM Medical Record [id number] Patient Account Number: [id number] Date of Birth/Sex: Treating RN: 1946-02-22 (73 y.o. [name] Primary Care : Chrisandra Netters Other Clinician: Referring : Treating /Extender:Stone III, Anabel Bene, Ivan Anchors in  Treatment: 52 Active Problems Location of Pain Severity and Description of Pain Patient Has Paino No Site Locations Rate the pain. Current Pain Level: 0 Pain Management and Medication Current Pain Management: Medication: No Cold Application: No Rest: No Massage: No Activity: No T.E.N.S.: No Heat Application: No Leg drop or elevation: No Is the Current Pain Management Adequate: Adequate How does your wound impact your activities of daily livingo Sleep: No Bathing: No Appetite: No Relationship With Others: No Bladder Continence: No Emotions: No Bowel Continence: No Work: No Toileting: No Drive: No Dressing: No Hobbies: No Electronic Signature(s) Signed: 07/07/2019 5:18:27 PM By: Deon Pilling Entered By: Deon Pilling on 07/07/2019 13:18:40 -------------------------------------------------------------------------------- Patient/Caregiver Education Details Patient Name: [name] 11/18/2020andnbsp12:30 Date of Service: PM Medical Record [id number] Number: Patient Account Number: [id number] Treating RN: Date of Birth/Gender: 16-Jun-1946 (73 y.o. [name]) Other Clinician: Primary Care Physician:McIntyre, [place] Primary Care Physician:McIntyre, [place], [place] Referring Physician: Physician/Extender: Tomasa Blase in Treatment: 62 Education Assessment Education Provided To: Patient Education Topics Provided Offloading: Methods: Explain/Verbal Responses: Reinforcements needed, State content correctly Wound/Skin Impairment: Methods: Explain/Verbal Responses: Reinforcements needed, State content correctly Electronic Signature(s) Signed: 07/07/2019 5:42:21 PM By: Baruch Gouty RN, BSN Entered By: Baruch Gouty on 07/07/2019 13:34:15 -------------------------------------------------------------------------------- Wound Assessment Details Patient Name: Date of Service: [name] 07/07/2019 12:30  PM Medical Record [id number] Patient Account Number: [id number] Date of Birth/Sex: Treating RN: 1945-10-02 (73 y.o. [name], Tammi Klippel Primary Care : Chrisandra Netters Other Clinician: Referring : Treating /Extender:Stone III, Anabel Bene, Ivan Anchors in Treatment: 52 Wound Status Wound Number: 19 Primary Diabetic Wound/Ulcer of the Lower Etiology: Extremity Wound Location: Left Foot - Plantar Wound Open Wounding Event: Gradually Appeared Status: Date Acquired: 08/07/2018 Comorbid Cataracts, Hypertension, Type II Diabetes, Weeks Of Treatment: 46 History: Rheumatoid Arthritis Clustered Wound: Yes Photos Wound Measurements Length: (cm) 0.7 % Red Width: (cm) 1 % Red Depth: (cm) 0.1 Epith Clustered Quantity: 1 Tunne Area: (cm) 0.55 Unde Volume: (cm) 0.055 Wound Description Classification: Grade 2 Wound Margin: Flat and Intact Exudate Amount: Medium Exudate Type: Serosanguineous Exudate Color: red, brown Wound Bed Granulation Amount: Large (67-100%) Granulation Quality: Pink Necrotic Amount: Small (1-33%) Necrotic Quality: Adherent Slough Foul Odor After Cleansing: No Slough/Fibrino Yes Exposed Structure Fascia Exposed: No Fat Layer (Subcutaneous Tissue) Exposed: Yes Tendon Exposed: No Muscle Exposed: No Joint Exposed: No Bone Exposed: No uction in Area: -133.1% uction in Volume: -17% elialization: Large (67-100%) ling: No rmining: No Treatment Notes Wound #19 (Left, Plantar Foot) 1. Cleanse With Wound Cleanser 2. Periwound Care Moisturizing lotion 3. Primary Dressing Applied Calcium Alginate Ag 4. Secondary Dressing Dry Gauze Roll Gauze 5. Secured With Medipore tape 6. Support Layer Applied Compression garment Notes felt in Keller. foam donut as secondary  dressing. juxtalite HD to bilateral lower legs. Electronic Signature(s) Signed: 07/12/2019 2:18:02 PM By: Deon Pilling Signed: 07/13/2019 7:37:04 AM By: Mikeal Hawthorne EMT/HBOT Previous Signature: 07/07/2019 5:18:27 PM Version By: Deon Pilling Entered By: Mikeal Hawthorne on 07/12/2019 12:26:03 -------------------------------------------------------------------------------- Vitals Details Patient Name: Date of Service: [name]. 07/07/2019 12:30 PM Medical Record [id number] Patient Account Number: [id number] Date of Birth/Sex: Treating RN: 12/25/1945 (73 y.o. [name], Meta.Reding Primary Care : Other Clinician: Chrisandra Netters Referring : Treating /Extender:Stone III, Anabel Bene, Ivan Anchors in Treatment: 52 Vital Signs Time Taken: 13:11 Temperature (F): 98.1 Height (in): 62 Pulse (bpm): 79 Weight (lbs): 140 Respiratory Rate (breaths/min): 18 Body Mass Index (BMI): 25.6 Blood Pressure (mmHg): 141/76 Reference Range: 80 - 120 mg / dl Electronic Signature(s) Signed: 07/07/2019 5:18:27 PM By: Deon Pilling Entered By: Deon Pilling on 07/07/2019 13:18:31

## 2019-07-07 NOTE — Progress Notes (Signed)
[name] ([id number]) Visit Report for 07/07/2019 Chief Complaint Document Details Patient Name: Date of Service: [name], [name] 07/07/2019 12:30 PM Medical Record [id number] Patient Account Number: [id number] Date of Birth/Sex: Treating RN: [birth year] (73 y.o. [name], [name] Primary Care Provider: Chrisandra Netters Other Clinician: Referring Provider: Treating Provider/Extender:Stone III, Anabel Bene, Ivan Anchors in Treatment: 52 Information Obtained from: Patient Chief Complaint Patients presents for treatment of an open diabetic ulcer to the left foot which she's had for about 2 weeks now 09/19/17; patient is here for review of blistered areas on the right leg 2 and on the left leg 1 which have been present for the last week 07/03/2018; patient comes back to clinic for review of wounds on her bilateral lower extremities Electronic Signature(s) Signed: 07/07/2019 5:11:42 PM By: Worthy Keeler PA-C Entered By: Worthy Keeler on 07/07/2019 13:30:13 -------------------------------------------------------------------------------- Debridement Details Patient Name: Date of Service: [name]. 07/07/2019 12:30 PM Medical Record [id number] Patient Account Number: [id number] Date of Birth/Sex: Jun 04, 1946 (73 y.o. F) Treating RN: Baruch Gouty Primary Care Provider: Chrisandra Netters Other Clinician: Referring Provider: Treating Provider/Extender:Stone III, Anabel Bene, Ivan Anchors in Treatment: 52 Debridement Performed for Wound #19 Left,Plantar Foot Assessment: Performed By: Physician Worthy Keeler, PA Debridement Type: Debridement Severity of Tissue Pre Fat layer exposed Debridement: Level of Consciousness (Pre- Awake and Alert procedure): Pre-procedure Verification/Time Out Taken: Yes - 13:30 Start Time: 13:32 Total Area Debrided (L x W): 2.9 (cm) x 3 (cm) = 8.7 (cm) Tissue and other material Tissue and other material Viable,  Non-Viable, Slough, Subcutaneous, Skin: Epidermis, Slough debrided: Level: Skin/Subcutaneous Tissue Debridement Description: Excisional Instrument: Curette, Forceps, Scissors Bleeding: Minimum Hemostasis Achieved: Pressure End Time: 13:37 Procedural Pain: 3 Post Procedural Pain: 1 Response to Treatment: Procedure was tolerated well Level of Consciousness Awake and Alert (Post-procedure): Post Debridement Measurements of Total Wound Length: (cm) 2.9 Width: (cm) 3 Depth: (cm) 0.1 Volume: (cm) 0.683 Character of Wound/Ulcer Post Improved Debridement: Severity of Tissue Post Debridement: Fat layer exposed Post Procedure Diagnosis Same as Pre-procedure Electronic Signature(s) Signed: 07/07/2019 5:11:42 PM By: Worthy Keeler PA-C Signed: 07/07/2019 5:42:21 PM By: Baruch Gouty RN, BSN Entered By: Baruch Gouty on 07/07/2019 13:38:42 -------------------------------------------------------------------------------- HPI Details Patient Name: Date of Service: [name]. 07/07/2019 12:30 PM Medical Record [id number] Patient Account Number: [id number] Date of Birth/Sex: Treating RN: [birth year] (73 y.o. [name] Primary Care Provider: Chrisandra Netters Other Clinician: Referring Provider: Treating Provider/Extender:Stone III, Anabel Bene, Ivan Anchors in Treatment: 52 History of Present Illness HPI Description: 73 year old patient who is known to our practice from May of last year was fully worked up with a venous and arterial duplex study and was referred to the vascular surgeon for follow-up. The patient says she has seen the vascular surgeons and has an appointment back in April. No procedure was done. She has developed a blister on the left big toe and forefoot which has been fairly large and draining fluid and she self-referred herself to [place]. The patient was recently seen in the ER on 10/06/2016, and was treated with clindamycin and asked to see  the podiatrist and PCP. She saw the podiatrist Dr. Felisa Bonier on 10/11/2016 who I understand did an x-ray but no report is available. Of note last year when I saw her, I had referred her to do Dr. Ruta Hinds -- he saw on 02/04/2016. he reviewed and noted that she had evidence of peripheral arterial disease and recommended a follow-up in 6 months for  repeat noninvasive arterial exam. Wounds worsened he would consider an angiogram. He saw her back on 03/14/2016, and continued conservative treatment and asked her to come back for noninvasive studies in 6 months. 12/03/2016 -- was seen in the office on 11/28/2016 and note is made of the fact that venous duplex examination on 01/02/2016 showed no evidence of reflux. Did have evidence of peripheral arterial disease and was asked to follow- up in 6 months time. Recent data on 11/28/2016 showed right noncompressible with monophasic waveforms left noncompressible with monophasic waveforms. The thought was she has medial calcifications of the arteries and had bilateral toe brachial indices which were normal. Right toe brachial index was 0.74 and the left toe brachial index was 0.80 She would return for a vascular study in 6 months when her ABI would be checked again. 7/6-Patient is back at 1 week visit to the clinic, home health is attending to the wound once a week, we have been using silver alginate, MRI scheduled this week on Thursday for the left foot. We are seeing her for the left foot plantar ulcer and the right great toe ulcer 12/24/2016 -- the left plantar heel had a large callus which came off during her evaluation and there was an open ulcer at the base ==== Old notes She was recently seen by her PCP for an ulcer of the left toe which was treated at the urgent care by giving her ciprofloxacin which she has been taking. She has uncontrolled diabetes and A1c was 13.7 done last week. Past medical history significant for poorly controlled  diabetes mellitus type 2, obesity, osteopenia, GERD, diabetic retinopathy, diabetic neuropathy, reported arthritis, hypothyroidism, hypertension. No x-rays were done recently. 12/27/2015 -- had an x-ray of the left foot --IMPRESSION:Juxta-articular erosions at the IP joint great toe and diffusely in the MTP joints greatest at first, suggesting an inflammatory arthropathy such as rheumatoid arthritis or gout.While septic arthritis can cause juxta-articular erosions, the multiplicity of joints involved makes this less likely. Significant soft tissue swelling with single tiny questionable focus of soft tissue gas medial to the first metatarsal head. If patient has persistent symptoms or persistent clinical concern for osteomyelitis, recommend MR imaging of the LEFT foot (with and without contrast if renal function permits). 01/03/2016 -- the lower extremity venous duplex reflex evaluation showed no evidence of deep or superficial thrombus or reflux. The arterial study done and showed the resting right ABI is greater than 1.3 and the left ABI was greater than 1.3 indicating arterial medial calcification. The right TBI was less than 0.64 and the left ABI was less than 0.64 both of which are abnormal. except for the left posterior tibial which is triphasic on the flow is biphasic. With the above results due to the fact that she has diabetes mellitus and would recommend she sees the vascular surgeon to see if any further intervention or procedure needs to be planned. ====== READMISSION 09/19/17;this is a 73 year old woman that we've had in our clinic previously for wounds on predominantly her left foot left toe and left heel. She is a diabetic. She has had lower extremity workups for both venous reflux disease and arterial disease. She has known noncompressible vessels bilaterally however in May 2017 as noted above her TBI was 0.64 and the left TBI was less than 0.64. When she was last in clinicin May  2017 the left heel wound had closed. She was faithfully wearing her juxta light stockings. She tells me 2-3 weeks ago she developed increasing  edema in her legs. She was seen by Dr. Valentino Saxon of nephrology who apparently increased her Lasix from 40-80 mg twice a day. The patient has not noticed any improvement in the edema. She does not weigh herself. About a week ago she noted blisters on her legs on the right 2 on the left 1 since then she has not been wearing the juxta light stockings. She has not had any pain no shortness of breath 09/26/17;; the patient has a stable wound as a result of blistering on the left anterior calf and 2 on the right anterior and right medial. There is another denuded blister on the left anterior more superior. This does not have any current fluid I did not remove the skin today. We've been using silver alginate under compression 10/03/17; the patient's right leg is healed. The area on the left anterior leg all wound sites look better. We've been using silver alginate under compression. In questioning the patient apparently has a juxta light stockings however she has not been using these. We asked her to find them lubricate her skin every night and used a juxta light stocking on the right. I would like to see this next time 10/10/17; both the patient's areas on the left anterior leg 2 are closed the area on the look right posterior medial calf remains closed. She is been using a juxta light stocking on the right she has one for the left Readmission [name] is now a 73 year old woman we have had in this clinic at least 3 times before. Most recently she was here in February 2019 for 2 visits with wounds on her bilateral lower extremities because of blisters these healed fairly quickly and we discharged her in juxta lite stockings. She was previously here in 2018 with a wound on her left foot and in 2017 she was sent to see Dr. Oneida Alar for follow-up of PAD. Indeed she  seems to have had follow-up noninvasive vascular studies every 6 months. Her current problems began at the end of October she apparently suffered a fall with a laceration on her right anterior leg. She had this sutured on 06/23/2018. Sometime in the same timeframe she developed blistering over a large area of the left anterior calf, the right great toe. The blisters have spontaneously ruptured and she has large areas of the epithelial loss. She also has non-open blisters on the medial part of the left great toe. There is also an open area on the right second toe. I do not believe that she was using her compression stockings [not juxta lites] because the swelling in her legs had gotten too large for her to wrap. She tells me she is recently been to her nephrologist and had her Lasix increased which is helped somewhat with the swelling. The patient did not have arterial studies done in our clinic. Her most recent noninvasive studies were in August. These showed ABIs noncompressible bilaterally. She had TBI's on the right at 0.54 and on the left at 0.60 biphasic waveforms at the PTA and DP bilaterally. She was felt to have bilateral ABIs that were unchanged from her previous study in October 2018. However it was noted her TBI's were decreased. Past medical history includes type 2 diabetes with peripheral neuropathy with a recent hemoglobin A1c of 7. She is apparently off her diabetes medications, she has stage III chronic renal failure, hyperlipidemia, hypertension, cataracts, retinopathy, rheumatoid arthritis, iron deficiency and apparently is receiving IV iron, urge incontinence 07/10/2018; she arrives today  with everything looking quite a bit better. The large area of blistered denuded epithelium on the left calf has healed over. The area on the right lateral tibial area is also mostly healed still with a linear open area where her laceration was. The area on her left medial toe is just about  healed. She has a dark thick black eschar over the tip of her right first toe. I do not remember this from last week this feels almost like the eschar you would see with ischemia yet her toes are warm and her peripheral pulses are palpable. She does have some degree of PAD but I do not think that was sufficient enough to have caused this. 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 Patient originally came here with a laceration injury of her right lateral mid calf. She also had an area of denuded skin on the left which is since closed over. She had a small area on the tip of her right great toe as well. She arrives today with what looks to be dry gangrene on the right great toe as well as discoloration of the tips of her second and third toes. She is not in any pain. She also had purulent drainage coming out open area on her left midfoot which was new this week. Culture was obtained The patient is known to vascular surgery having last been seen on 04/02/2018. They follow her for chronic arterial insufficiency. Last arterial studies were in August 2017 at which time her ABIs were noncompressible however her TBI was 0.54 on the right and 0.60 on the left. She was noted to have bilateral ankle arteries remain calcified. Waveforms biphasic. Slight decline in bilateral TBI's 07/24/2018; the patient was graciously seen urgently by vein and vascular with regards to critical limb ischemia, she had a CO2 angiogram and then contrast to look at the lower extremity arteries. Surprisingly no significant vascular disease was noted in the aortic iliac set segment, patent common femoral and profunda arteries patent popliteal and three-vessel runoff with all 3 of the tibials present. Her dorsalis pedis and plantar arch vessels also filled. Notable for the fact that it was felt that she had small vessel disease near her toes but this was not amenable for intervention. The  abscess that I unroofed last week on the plantar left foot grew Stenotrophomonas Maltophilia. This is not a usual true wound pathogen. However it had abundant organisms and given the fact that is an abscess I went ahead and treated this with Levaquin 250 daily for 7 days. I had her on doxycycline last week 07/31/2018; I put 3 layer compression on the right leg last week. She arrives in today with almost no edema in the right leg and out of concern for the this in the gangrenous toes I am going to reduce this back to Kerlix and Coban which is what we had her on at the beginning. We are using silver alginate to the traumatic wound on her right anterior leg and this looks a lot better. The area on the left foot is healed and the patient is using her own wraparound compression stocking here. She still has the dry gangrene-like changes predominantly involving the right first toe, tip of her right second and third toes. We are using Betadine here. The exact etiology of this was not completely clear 08/07/18; we've been using 2 layer compression on the right leg. She still has the gangrenous toes for second  and third on the right and the original laceration injury. She does not have any significant vascular issues to the level of the ankle. She does have small vessel disease in her toes.The left mid foot wound is closed 08/14/18; the patient arrives in clinic today with improvement in the original area on her right lateral leg. The 3 dry gangrenous toes first second and third on the right all look stable except for the second. This was obviously separating. It was removed fortunately there is healthy-looking surface tissue underneath this. She arrives in clinic with a reopening on the left midfoot. This was previously a small abscess that had closed. She has Charcot feet bilaterally 08/24/2018; right lateral leg wound is smaller. The 3 dry gangrenous first second and third toes are a bit changed. The third  toe was separating I remove the eschar and subcutaneous tissue to reveal a small open area at the tip of the toe just at the base at the head of the nail. The first toe is unchanged. Left plantar foot is larger. This is in the Charcot foot area. I removed nonviable circumference and surface from the wound using silver alginate 08/31/2018; right lateral leg wound continues to contract also the area on her plantar foot. First great toe is beginning to separate and the nail will soon I think fall off.. She sees vein and vascular tomorrow. 1/20; right lateral leg wound continues to contract and is almost closed first great toe is beginning to separate. The area on her left plantar foot still has rolled edges around the wound and some debris in the surface but generally smaller. We have been using silver alginate to the 2 remaining wound areas and Betadine to the left great toe. The patient saw vein and vascular on 1/14. They did not add anything here. Noted that the patient has microvascular disease but no major macrovascular issues. No intervention was performed during her CO2 aortogram on 07/23/2018. 2/3; 2-week follow-up. Right leg wound which is her original wound and coming here is healed. Second and third right toes have healed. She still has dry eschared/dry gangrene over the right first toe. I thought this might begin to separate but it really is not making great headway with that. The area on the left midfoot reopened 2 to 3 weeks ago and this is made no progress. Finally she has new blisters on the left first toe laterally and the left second toe dorsally over the DIP at the base of the nail 2/17; 2-week follow-up. Right leg wound which is her original wound is healed. Right second and third toes are still healed. She still has the black ischemic eschar over the first toe which has not really begun to separate. The area on the left midfoot is unfortunately worse with large amounts of denuded  skin around this. She has blisters on the medial part of her first toe and the dorsal part of the second toe on the left 2/24; she still has the black ischemic eschar over the first toe which is not really separating. The area on the left midfoot seems stable from last week. The blisters on the medial part of her first toe and dorsal second toe had hardened and I removed both of these there are small open areas underneath. 3/2; left first and second toes are healed. The area on the left plantar foot looks better. Finally the right great toe area is beginning to separate. 3/9; left first and second toes remain healed. Left  plantar foot continues to look excellent smaller with a healthy base. Finally the right great toe had separated further. I went ahead and remove the eschar here. There is still a fairly sizable wound here but this will give [place] a better chance to address this. 3/16-Left first and second toes mostly healed although the left first toe appears to have an open area Right great toe deep ulcer with exposed bone, greenish debris with necrotic tissue, right second toe wound with clean edges and base. Patient had silver alginate dressing to all the toes especially on the right. SHe has been offloading with inserts and open toe shoes 3/23; since I last saw this patient 2 weeks ago there is been quite a bit of deterioration. The left plantar foot continues to look about the same. She now has an area on the left lateral first toe. On the right foot the first toe has a fair amount of exposed bone. X-ray that we did last week did not show osteomyelitis nevertheless that would have to be a concern. She also has an area on the tip of the right second toe that is new 3/30; some improvement in the left plantar foot. Bone I took out of the right first toe last week did not show osteomyelitis under pathology however culture did grow rare staph aureus and rare Citrobacter. We have been using silver  alginate to all her wounds 4/6; left plantar foot wound about the same although this does not look ominous. The right first toe which is been very problematic does not have exposed bone aggressive debridement of the surface of this. She does have grams of epithelialization. We have been using silver alginate on both wounds 4/13; left plantar foot wound about the same. Removed skin and subcutaneous tissue from the wound circumference. The right great toe has underlying osteomyelitis I have extended her Levaquin 500 mg for another 2 weeks. She does not complain of any overt side effects. 4/23;. The patient has wounds on the left plantar foot, left lateral great toe and a new area on the dorsal part of the right second toe. Her large area over the tip of her right great toe. She is on Levaquin I will need to check on when we want to finish this. This is for underlying osteomyelitis of the right great toe 5/4; the patient has or should be completing her Levaquin at this point. This is for osteomyelitis in the right great toe. She also has wounds on her left plantar foot and the dorsal part of her right second toe. We have been using silver alginate 5/11; patient arrives today with her right great toe generally looking better. However the area on the plantar left foot had considerable undermining and discomfort around the wound and some erythema. We have been using silver alginate 5/18-Patient returns at 1 week the right great toe is much more macerated around the edges of the wound, the left foot wounds are considerably better especially the left great toe and left second toe. We have been using silver alginate to all wounds and juxta lite to the right leg and 2 layer compression to the left. THere is now a fluid filled bleb on the posterior calf of RLE 5/26; right great toe continues to improve. The underlying osteomyelitis was treated empirically in this clinic. Left midfoot wound also looks  better than the last time I saw this 2 weeks ago. Apparently last week she had a blister on the right posterior calf that  was largely. This was not specifically addressed. It is open since last time she has a large area on the right posterior mid calf with skin attached to a large amount of the circumference. 6/1; we continue to have true improvements in both of the wound areas on the right great toe and left plantar foot. The large blister on the posterior right calf is just about fully epithelialized although there is still some weeping edema in this area 6/8; we continued to have been improvements in both areas currently on the right great toe and left plantar foot. The large blister on the posterior right calf is fully healed. 6/15; we we continue to have improvements in the right great toe. The left plantar foot which was a small wound without undermining last week has decided to expand into the lateral foot. There is some swelling in this area. She is not systemically unwell 6/22; the culture of her left foot that I did last week grew Enterobacter I had her on doxycycline. There was also clearly some purulent drainage coming from this today through the original wound with the swelling on the lateral part of her foot. She is not systemically unwell. 6/25 the patient is on Levaquin for the Enterobacter cultured out of her left foot. She now has an extensive wound area in the medial left foot we are using silver alginate The patient complains of pain in the foot going up towards the metatarsal head. There is also swelling inferiorly to this area that we identified last week but this is not tender 6/29; the patient is completing her Levaquin for the Enterobacter. We are using silver alginate to the extensive wound on the left plantar foot as well as to the right great toe 7/14; the right great toe is healed today. The area on the left foot looks better. The MRI of the left foot did not  show osteomyelitis in the area of the wound however it did show an effusion in the tibiotalar joint. The patient has rheumatoid arthritis 7/20; right great toe remains healed. The area on the left foot continues to look improved. 7/27 right great toe remains healed. The area on the left foot continues to contract. Healthy granulation tissue she is only offloading this in surgical shoes. 03/24/2019 on evaluation today patient appears to be doing very well with regard to her plantar foot ulcer. She has been tolerating the dressing changes without complication. She has a healthy granulation surface. There is no signs of active infection at this time and her swelling seems to be under good control. 8/17; the areas on the left medial midfoot. Probably subluxed bone in this area. Wounds are superficial. It looks as though there was some blistering skin at one point 8/24; the area on the left medial midfoot. This is completely resolved at this point although it still looks somewhat vulnerable. She probably has subluxed bone underneath this area. She has pes planus deformity 9/14; apparently this wound reopened in the 3 weeks since I saw her. She was seen last week she is given new healing sandals. Been using calcium alginate with felt offloading. This is not surprising given the area looks somewhat vulnerable. 9/28; left plantar foot in the setting of diabetic neuropathy probably some degree of Charcot deformity. She also has abrasions on the left leg which she says are due to the Curlex co-band that was put on by home health. She is also having a lot of pain in the left calf finds the  wrap too tight 10/5; left plantar foot has 2 superficial open areas. The abrasions on the left leg from last week are healed. She still has some swelling and tenderness however. Duplex ultrasound of the leg tomorrow tomorrow. 10/13; patient arrived with a deterioration in the left foot from last week. Undermining wound  probably connecting the 2 superficial areas from last week. She wears a surgical shoe with felt offloading. Duplex ultrasound she had last week showed a ruptured Baker's cyst. Her leg looks a lot better 10/20; 2 remanent open areas in the left foot. This looks better than last time. We are using silver alginate. She wears a surgical shoe for offloading there is not a more aggressive option 10/26; the patient does not have any open on the left foot we are using silver alginate to the medial aspect of the foot. She wears a surgical shoe on both feet. Juxta lite stocking on the right, she has 1 for the left. This is been a long haul for this patient. Initially coming in with a wound on her right lateral calf. Then her right great toe. More recently areas on the left medial foot 06/30/2019 on evaluation today this is a patient that I regularly do not see but unfortunately she has a wound on the plantar aspect of her foot which I have seen before that appeared to likely be healed last week although it reopened before she even got to the end of the week. This is mainly on the plantar aspect of her foot. She is really not been able to find any sandals that Dr. Dellia Nims was talking to her about as far as going forward what she should be wearing. Fortunately there is no evidence of active infection at this time which is good news. No fevers, chills, nausea, vomiting, or diarrhea. 07/07/2019 on evaluation today patient appears to be doing very well in regard to her foot ulcer from the standpoint of the original wound opening. Unfortunately the biggest issue I see right now she does seem to have a sinus tract along with a blister off the medial portion of her foot which is going to be giving her trouble at this point. We will continue to clean this away in order to allow this to heal appropriately. It does appear that this may actually be open underneath the blister. Electronic Signature(s) Signed:  07/07/2019 5:11:42 PM By: Worthy Keeler PA-C Entered By: Worthy Keeler on 07/07/2019 13:39:06 -------------------------------------------------------------------------------- Physical Exam Details Patient Name: Date of Service: QUINITA, KOSTELECKY 07/07/2019 12:30 PM Medical Record [id number] Patient Account Number: [id number] Date of Birth/Sex: Treating RN: [birth year] (73 y.o. [name] Primary Care Provider: Chrisandra Netters Other Clinician: Referring Provider: Treating Provider/Extender:Stone III, Anabel Bene, Ivan Anchors in Treatment: 76 Constitutional Well-nourished and well-hydrated in no acute distress. Respiratory normal breathing without difficulty. Psychiatric this patient is able to make decisions and demonstrates good insight into disease process. Alert and Oriented x 3. pleasant and cooperative. Notes Patient's wound bed currently showed signs of good granulation at this time. Fortunately there is no signs of active infection. I did remove the blistered tissue as well as some slough and biofilm from the surface of the wound down to good subcutaneous tissue. This actually formed somewhat of a T shape post debridement. This seems to be doing quite well which is good news. Fortunately there is no signs of active infection. Electronic Signature(s) Signed: 07/07/2019 5:11:42 PM By: Worthy Keeler PA-C Entered By: Worthy Keeler  on 07/07/2019 13:39:32 -------------------------------------------------------------------------------- Physician Orders Details Patient Name: Date of Service: JAYME, MEDNICK 07/07/2019 12:30 PM Medical Record [id number] Patient Account Number: [id number] Date of Birth/Sex: Treating RN: Dec 15, 1945 (73 y.o. [name] Primary Care Provider: Chrisandra Netters Other Clinician: Referring Provider: Treating Provider/Extender:Stone III, Anabel Bene, Ivan Anchors in Treatment: 81 Verbal / Phone Orders:  No Diagnosis Coding ICD-10 Coding Code Description E11.42 Type 2 diabetes mellitus with diabetic polyneuropathy L97.518 Non-pressure chronic ulcer of other part of right foot with other specified severity L97.521 Non-pressure chronic ulcer of other part of left foot limited to breakdown of skin L02.612 Cutaneous abscess of left foot S80.812D Abrasion, left lower leg, subsequent encounter Follow-up Appointments Return Appointment in 2 weeks. Dressing Change Frequency Wound #19 Left,Plantar Foot Other: - Change 2 times per week Skin Barriers/Peri-Wound Care Wound #19 Left,Plantar Foot Moisturizing lotion - to both legs Wound Cleansing Wound #19 Left,Plantar Foot Clean wound with Wound Cleanser Primary Wound Dressing Wound #19 Left,Plantar Foot Calcium Alginate with Silver Secondary Dressing Wound #19 Left,Plantar Foot Foam - donut Kerlix/Rolled Gauze Dry Gauze - or foam border Edema Control Wound #19 Left,Plantar Foot Avoid standing for long periods of time Elevate legs to the level of the heart or above for 30 minutes daily and/or when sitting, a frequency of: - throughout the day Support Garment 20-30 mm/Hg pressure to: - juxtalite to both legs daily Off-Loading Open toe surgical shoe to: - left foot with felt. [place] [facility] for wound care. Lajean Manes Electronic Signature(s) Signed: 07/07/2019 5:11:42 PM By: Worthy Keeler PA-C Signed: 07/07/2019 5:42:21 PM By: Baruch Gouty RN, BSN Entered By: Baruch Gouty on 07/07/2019 13:37:19 -------------------------------------------------------------------------------- Problem List Details Patient Name: Date of Service: [name]. 07/07/2019 12:30 PM Medical Record [id number] Patient Account Number: [id number] Date of Birth/Sex: Treating RN: 10/14/1945 (73 y.o. [name] Primary Care Provider: Chrisandra Netters Other Clinician: Referring Provider: Treating  Provider/Extender:Stone III, Anabel Bene, Ivan Anchors in Treatment: 52 Active Problems ICD-10 Evaluated Encounter Code Description Active Date Today Diagnosis E11.42 Type 2 diabetes mellitus with diabetic polyneuropathy 07/03/2018 No Yes L97.518 Non-pressure chronic ulcer of other part of right foot 07/20/2018 No Yes with other specified severity L97.521 Non-pressure chronic ulcer of other part of left foot 08/15/2018 No Yes limited to breakdown of skin L02.612 Cutaneous abscess of left foot 07/20/2018 No Yes S80.812D Abrasion, left lower leg, subsequent encounter 05/17/2019 No Yes Inactive Problems ICD-10 Code Description Active Date Inactive Date L97.511 Non-pressure chronic ulcer of other part of right foot limited to 07/03/2018 07/03/2018 breakdown of skin E11.52 Type 2 diabetes mellitus with diabetic peripheral angiopathy 07/20/2018 07/20/2018 with gangrene Resolved Problems ICD-10 Code Description Active Date Resolved Date S81.811D Laceration without foreign body, right lower leg, subsequent 07/03/2018 07/03/2018 encounter L97.821 Non-pressure chronic ulcer of other part of left lower leg limited 07/03/2018 07/03/2018 to breakdown of skin L97.211 Non-pressure chronic ulcer of right calf limited to breakdown of 01/12/2019 01/12/2019 skin Electronic Signature(s) Signed: 07/07/2019 5:11:42 PM By: Worthy Keeler PA-C Entered By: Worthy Keeler on 07/07/2019 13:30:07 -------------------------------------------------------------------------------- Progress Note Details Patient Name: Date of Service: [name]. 07/07/2019 12:30 PM Medical Record [id number] Patient Account Number: [id number] Date of Birth/Sex: Treating RN: [birth year] (73 y.o. [name] Primary Care Provider: Chrisandra Netters Other Clinician: Referring Provider: Treating Provider/Extender:Stone III, Anabel Bene, Ivan Anchors in Treatment: 52 Subjective Chief Complaint Information  obtained from Patient Patients presents for treatment of an open diabetic ulcer to the  left foot which she's had for about 2 weeks now 09/19/17; patient is here for review of blistered areas on the right leg o2 and on the left leg o1 which have been present for the last week 07/03/2018; patient comes back to clinic for review of wounds on her bilateral lower extremities History of Present Illness (HPI) 73 year old patient who is known to our practice from May of last year was fully worked up with a venous and arterial duplex study and was referred to the vascular surgeon for follow-up. The patient says she has seen the vascular surgeons and has an appointment back in April. No procedure was done. She has developed a blister on the left big toe and forefoot which has been fairly large and draining fluid and she self-referred herself to [place]. The patient was recently seen in the ER on 10/06/2016, and was treated with clindamycin and asked to see the podiatrist and PCP. She saw the podiatrist Dr. Felisa Bonier on 10/11/2016 who I understand did an x-ray but no report is available. Of note last year when I saw her, I had referred her to do Dr. Ruta Hinds -- he saw on 02/04/2016. he reviewed and noted that she had evidence of peripheral arterial disease and recommended a follow-up in 6 months for repeat noninvasive arterial exam. Wounds worsened he would consider an angiogram. He saw her back on 03/14/2016, and continued conservative treatment and asked her to come back for noninvasive studies in 6 months. 12/03/2016 -- was seen in the office on 11/28/2016 and note is made of the fact that venous duplex examination on 01/02/2016 showed no evidence of reflux. Did have evidence of peripheral arterial disease and was asked to follow- up in 6 months time. Recent data on 11/28/2016 showed right noncompressible with monophasic waveforms left noncompressible with monophasic waveforms. The thought was she has  medial calcifications of the arteries and had bilateral toe brachial indices which were normal. Right toe brachial index was 0.74 and the left toe brachial index was 0.80 She would return for a vascular study in 6 months when her ABI would be checked again. 7/6-Patient is back at 1 week visit to the clinic, home health is attending to the wound once a week, we have been using silver alginate, MRI scheduled this week on Thursday for the left foot. We are seeing her for the left foot plantar ulcer and the right great toe ulcer 12/24/2016 -- the left plantar heel had a large callus which came off during her evaluation and there was an open ulcer at the base ==== Old notes She was recently seen by her PCP for an ulcer of the left toe which was treated at the urgent care by giving her ciprofloxacin which she has been taking. She has uncontrolled diabetes and A1c was 13.7 done last week. Past medical history significant for poorly controlled diabetes mellitus type 2, obesity, osteopenia, GERD, diabetic retinopathy, diabetic neuropathy, reported arthritis, hypothyroidism, hypertension. No x-rays were done recently. 12/27/2015 -- had an x-ray of the left foot --IMPRESSION:Juxta-articular erosions at the IP joint great toe and diffusely in the MTP joints greatest at first, suggesting an inflammatory arthropathy such as rheumatoid arthritis or gout.While septic arthritis can cause juxta-articular erosions, the multiplicity of joints involved makes this less likely. Significant soft tissue swelling with single tiny questionable focus of soft tissue gas medial to the first metatarsal head. If patient has persistent symptoms or persistent clinical concern for osteomyelitis, recommend MR imaging of the LEFT foot (  with and without contrast if renal function permits). 01/03/2016 -- the lower extremity venous duplex reflex evaluation showed no evidence of deep or superficial thrombus or reflux. The arterial  study done and showed the resting right ABI is greater than 1.3 and the left ABI was greater than 1.3 indicating arterial medial calcification. The right TBI was less than 0.64 and the left ABI was less than 0.64 both of which are abnormal. except for the left posterior tibial which is triphasic on the flow is biphasic. With the above results due to the fact that she has diabetes mellitus and would recommend she sees the vascular surgeon to see if any further intervention or procedure needs to be planned. ====== READMISSION 09/19/17;this is a 73 year old woman that we've had in our clinic previously for wounds on predominantly her left foot left toe and left heel. She is a diabetic. She has had lower extremity workups for both venous reflux disease and arterial disease. She has known noncompressible vessels bilaterally however in May 2017 as noted above her TBI was 0.64 and the left TBI was less than 0.64. When she was last in clinicin May 2017 the left heel wound had closed. She was faithfully wearing her juxta light stockings. She tells me 2-3 weeks ago she developed increasing edema in her legs. She was seen by Dr. Valentino Saxon of nephrology who apparently increased her Lasix from 40-80 mg twice a day. The patient has not noticed any improvement in the edema. She does not weigh herself. About a week ago she noted blisters on her legs on the right o2 on the left o1 since then she has not been wearing the juxta light stockings. She has not had any pain no shortness of breath 09/26/17;; the patient has a stable wound as a result of blistering on the left anterior calf and 2 on the right anterior and right medial. There is another denuded blister on the left anterior more superior. This does not have any current fluid I did not remove the skin today. We've been using silver alginate under compression 10/03/17; the patient's right leg is healed. The area on the left anterior leg all wound sites look  better. We've been using silver alginate under compression. In questioning the patient apparently has a juxta light stockings however she has not been using these. We asked her to find them lubricate her skin every night and used a juxta light stocking on the right. I would like to see this next time 10/10/17; both the patient's areas on the left anterior leg o2 are closed the area on the look right posterior medial calf remains closed. She is been using a juxta light stocking on the right she has one for the left Readmission [name] is now a 73 year old woman we have had in this clinic at least 3 times before. Most recently she was here in February 2019 for 2 visits with wounds on her bilateral lower extremities because of blisters these healed fairly quickly and we discharged her in juxta lite stockings. She was previously here in 2018 with a wound on her left foot and in 2017 she was sent to see Dr. Oneida Alar for follow-up of PAD. Indeed she seems to have had follow-up noninvasive vascular studies every 6 months. Her current problems began at the end of October she apparently suffered a fall with a laceration on her right anterior leg. She had this sutured on 06/23/2018. Sometime in the same timeframe she developed blistering over a large  area of the left anterior calf, the right great toe. The blisters have spontaneously ruptured and she has large areas of the epithelial loss. She also has non-open blisters on the medial part of the left great toe. There is also an open area on the right second toe. I do not believe that she was using her compression stockings [not juxta lites] because the swelling in her legs had gotten too large for her to wrap. She tells me she is recently been to her nephrologist and had her Lasix increased which is helped somewhat with the swelling. The patient did not have arterial studies done in our clinic. Her most recent noninvasive studies were in August. These  showed ABIs noncompressible bilaterally. She had TBI's on the right at 0.54 and on the left at 0.60 biphasic waveforms at the PTA and DP bilaterally. She was felt to have bilateral ABIs that were unchanged from her previous study in October 2018. However it was noted her TBI's were decreased. Past medical history includes type 2 diabetes with peripheral neuropathy with a recent hemoglobin A1c of 7. She is apparently off her diabetes medications, she has stage III chronic renal failure, hyperlipidemia, hypertension, cataracts, retinopathy, rheumatoid arthritis, iron deficiency and apparently is receiving IV iron, urge incontinence 07/10/2018; she arrives today with everything looking quite a bit better. The large area of blistered denuded epithelium on the left calf has healed over. The area on the right lateral tibial area is also mostly healed still with a linear open area where her laceration was. The area on her left medial toe is just about healed. She has a dark thick black eschar over the tip of her right first toe. I do not remember this from last week this feels almost like the eschar you would see with ischemia yet her toes are warm and her peripheral pulses are palpable. She does have some degree of PAD but I do not think that was sufficient enough to have caused this. 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 Patient originally came here with a laceration injury of her right lateral mid calf. She also had an area of denuded skin on the left which is since closed over. She had a small area on the tip of her right great toe as well. She arrives today with what looks to be dry gangrene on the right great toe as well as discoloration of the tips of her second and third toes. She is not in any pain. She also had purulent drainage coming out open area on her left midfoot which was new this week. Culture was obtained The patient is known to vascular  surgery having last been seen on 04/02/2018. They follow her for chronic arterial insufficiency. Last arterial studies were in August 2017 at which time her ABIs were noncompressible however her TBI was 0.54 on the right and 0.60 on the left. She was noted to have bilateral ankle arteries remain calcified. Waveforms biphasic. Slight decline in bilateral TBI's 07/24/2018; the patient was graciously seen urgently by vein and vascular with regards to critical limb ischemia, she had a CO2 angiogram and then contrast to look at the lower extremity arteries. Surprisingly no significant vascular disease was noted in the aortic iliac set segment, patent common femoral and profunda arteries patent popliteal and three-vessel runoff with all 3 of the tibials present. Her dorsalis pedis and plantar arch vessels also filled. Notable for the fact that it was felt  that she had small vessel disease near her toes but this was not amenable for intervention. The abscess that I unroofed last week on the plantar left foot grew Stenotrophomonas Maltophilia. This is not a usual true wound pathogen. However it had abundant organisms and given the fact that is an abscess I went ahead and treated this with Levaquin 250 daily for 7 days. I had her on doxycycline last week 07/31/2018; I put 3 layer compression on the right leg last week. She arrives in today with almost no edema in the right leg and out of concern for the this in the gangrenous toes I am going to reduce this back to Kerlix and Coban which is what we had her on at the beginning. We are using silver alginate to the traumatic wound on her right anterior leg and this looks a lot better. The area on the left foot is healed and the patient is using her own wraparound compression stocking here. She still has the dry gangrene-like changes predominantly involving the right first toe, tip of her right second and third toes. We are using Betadine here. The exact etiology  of this was not completely clear 08/07/18; we've been using 2 layer compression on the right leg. She still has the gangrenous toes for second and third on the right and the original laceration injury. She does not have any significant vascular issues to the level of the ankle. She does have small vessel disease in her toes.The left mid foot wound is closed 08/14/18; the patient arrives in clinic today with improvement in the original area on her right lateral leg. The 3 dry gangrenous toes first second and third on the right all look stable except for the second. This was obviously separating. It was removed fortunately there is healthy-looking surface tissue underneath this. ooShe arrives in clinic with a reopening on the left midfoot. This was previously a small abscess that had closed. She has Charcot feet bilaterally 08/24/2018; right lateral leg wound is smaller. The 3 dry gangrenous first second and third toes are a bit changed. The third toe was separating I remove the eschar and subcutaneous tissue to reveal a small open area at the tip of the toe just at the base at the head of the nail. The first toe is unchanged. ooLeft plantar foot is larger. This is in the Charcot foot area. I removed nonviable circumference and surface from the wound using silver alginate 08/31/2018; right lateral leg wound continues to contract also the area on her plantar foot. First great toe is beginning to separate and the nail will soon I think fall off.. She sees vein and vascular tomorrow. 1/20; right lateral leg wound continues to contract and is almost closed first great toe is beginning to separate. The area on her left plantar foot still has rolled edges around the wound and some debris in the surface but generally smaller. We have been using silver alginate to the 2 remaining wound areas and Betadine to the left great toe. The patient saw vein and vascular on 1/14. They did not add anything here. Noted  that the patient has microvascular disease but no major macrovascular issues. No intervention was performed during her CO2 aortogram on 07/23/2018. 2/3; 2-week follow-up. Right leg wound which is her original wound and coming here is healed. Second and third right toes have healed. She still has dry eschared/dry gangrene over the right first toe. I thought this might begin to separate but it really  is not making great headway with that. The area on the left midfoot reopened 2 to 3 weeks ago and this is made no progress. Finally she has new blisters on the left first toe laterally and the left second toe dorsally over the DIP at the base of the nail 2/17; 2-week follow-up. Right leg wound which is her original wound is healed. Right second and third toes are still healed. She still has the black ischemic eschar over the first toe which has not really begun to separate. The area on the left midfoot is unfortunately worse with large amounts of denuded skin around this. She has blisters on the medial part of her first toe and the dorsal part of the second toe on the left 2/24; she still has the black ischemic eschar over the first toe which is not really separating. The area on the left midfoot seems stable from last week. The blisters on the medial part of her first toe and dorsal second toe had hardened and I removed both of these there are small open areas underneath. 3/2; left first and second toes are healed. The area on the left plantar foot looks better. Finally the right great toe area is beginning to separate. 3/9; left first and second toes remain healed. Left plantar foot continues to look excellent smaller with a healthy base. Finally the right great toe had separated further. I went ahead and remove the eschar here. There is still a fairly sizable wound here but this will give [place] a better chance to address this. 3/16-Left first and second toes mostly healed although the left first toe  appears to have an open area Right great toe deep ulcer with exposed bone, greenish debris with necrotic tissue, right second toe wound with clean edges and base. Patient had silver alginate dressing to all the toes especially on the right. SHe has been offloading with inserts and open toe shoes 3/23; since I last saw this patient 2 weeks ago there is been quite a bit of deterioration. The left plantar foot continues to look about the same. She now has an area on the left lateral first toe. On the right foot the first toe has a fair amount of exposed bone. X-ray that we did last week did not show osteomyelitis nevertheless that would have to be a concern. She also has an area on the tip of the right second toe that is new 3/30; some improvement in the left plantar foot. Bone I took out of the right first toe last week did not show osteomyelitis under pathology however culture did grow rare staph aureus and rare Citrobacter. We have been using silver alginate to all her wounds 4/6; left plantar foot wound about the same although this does not look ominous. The right first toe which is been very problematic does not have exposed bone aggressive debridement of the surface of this. She does have grams of epithelialization. We have been using silver alginate on both wounds 4/13; left plantar foot wound about the same. Removed skin and subcutaneous tissue from the wound circumference. ooThe right great toe has underlying osteomyelitis I have extended her Levaquin 500 mg for another 2 weeks. She does not complain of any overt side effects. 4/23;. The patient has wounds on the left plantar foot, left lateral great toe and a new area on the dorsal part of the right second toe. Her large area over the tip of her right great toe. She is on Levaquin I  will need to check on when we want to finish this. This is for underlying osteomyelitis of the right great toe 5/4; the patient has or should be completing  her Levaquin at this point. This is for osteomyelitis in the right great toe. She also has wounds on her left plantar foot and the dorsal part of her right second toe. We have been using silver alginate 5/11; patient arrives today with her right great toe generally looking better. However the area on the plantar left foot had considerable undermining and discomfort around the wound and some erythema. We have been using silver alginate 5/18-Patient returns at 1 week the right great toe is much more macerated around the edges of the wound, the left foot wounds are considerably better especially the left great toe and left second toe. We have been using silver alginate to all wounds and juxta lite to the right leg and 2 layer compression to the left. THere is now a fluid filled bleb on the posterior calf of RLE 5/26; right great toe continues to improve. The underlying osteomyelitis was treated empirically in this clinic. ooLeft midfoot wound also looks better than the last time I saw this 2 weeks ago. ooApparently last week she had a blister on the right posterior calf that was largely. This was not specifically addressed. It is open since last time she has a large area on the right posterior mid calf with skin attached to a large amount of the circumference. 6/1; we continue to have true improvements in both of the wound areas on the right great toe and left plantar foot. The large blister on the posterior right calf is just about fully epithelialized although there is still some weeping edema in this area 6/8; we continued to have been improvements in both areas currently on the right great toe and left plantar foot. The large blister on the posterior right calf is fully healed. 6/15; we we continue to have improvements in the right great toe. The left plantar foot which was a small wound without undermining last week has decided to expand into the lateral foot. There is some swelling in this  area. She is not systemically unwell 6/22; the culture of her left foot that I did last week grew Enterobacter I had her on doxycycline. There was also clearly some purulent drainage coming from this today through the original wound with the swelling on the lateral part of her foot. She is not systemically unwell. 6/25 the patient is on Levaquin for the Enterobacter cultured out of her left foot. She now has an extensive wound area in the medial left foot we are using silver alginate The patient complains of pain in the foot going up towards the metatarsal head. There is also swelling inferiorly to this area that we identified last week but this is not tender 6/29; the patient is completing her Levaquin for the Enterobacter. We are using silver alginate to the extensive wound on the left plantar foot as well as to the right great toe 7/14; the right great toe is healed today. The area on the left foot looks better. The MRI of the left foot did not show osteomyelitis in the area of the wound however it did show an effusion in the tibiotalar joint. The patient has rheumatoid arthritis 7/20; right great toe remains healed. The area on the left foot continues to look improved. 7/27 right great toe remains healed. The area on the left foot continues to contract.  Healthy granulation tissue she is only offloading this in surgical shoes. 03/24/2019 on evaluation today patient appears to be doing very well with regard to her plantar foot ulcer. She has been tolerating the dressing changes without complication. She has a healthy granulation surface. There is no signs of active infection at this time and her swelling seems to be under good control. 8/17; the areas on the left medial midfoot. Probably subluxed bone in this area. Wounds are superficial. It looks as though there was some blistering skin at one point 8/24; the area on the left medial midfoot. This is completely resolved at this point although  it still looks somewhat vulnerable. She probably has subluxed bone underneath this area. She has pes planus deformity 9/14; apparently this wound reopened in the 3 weeks since I saw her. She was seen last week she is given new healing sandals. Been using calcium alginate with felt offloading. This is not surprising given the area looks somewhat vulnerable. 9/28; left plantar foot in the setting of diabetic neuropathy probably some degree of Charcot deformity. She also has abrasions on the left leg which she says are due to the Curlex co-band that was put on by home health. She is also having a lot of pain in the left calf finds the wrap too tight 10/5; left plantar foot has 2 superficial open areas. The abrasions on the left leg from last week are healed. She still has some swelling and tenderness however. Duplex ultrasound of the leg tomorrow tomorrow. 10/13; patient arrived with a deterioration in the left foot from last week. Undermining wound probably connecting the 2 superficial areas from last week. She wears a surgical shoe with felt offloading. Duplex ultrasound she had last week showed a ruptured Baker's cyst. Her leg looks a lot better 10/20; 2 remanent open areas in the left foot. This looks better than last time. We are using silver alginate. She wears a surgical shoe for offloading there is not a more aggressive option 10/26; the patient does not have any open on the left foot we are using silver alginate to the medial aspect of the foot. She wears a surgical shoe on both feet. Juxta lite stocking on the right, she has 1 for the left. This is been a long haul for this patient. Initially coming in with a wound on her right lateral calf. Then her right great toe. More recently areas on the left medial foot 06/30/2019 on evaluation today this is a patient that I regularly do not see but unfortunately she has a wound on the plantar aspect of her foot which I have seen before that  appeared to likely be healed last week although it reopened before she even got to the end of the week. This is mainly on the plantar aspect of her foot. She is really not been able to find any sandals that Dr. Dellia Nims was talking to her about as far as going forward what she should be wearing. Fortunately there is no evidence of active infection at this time which is good news. No fevers, chills, nausea, vomiting, or diarrhea. 07/07/2019 on evaluation today patient appears to be doing very well in regard to her foot ulcer from the standpoint of the original wound opening. Unfortunately the biggest issue I see right now she does seem to have a sinus tract along with a blister off the medial portion of her foot which is going to be giving her trouble at this point. We will continue  to clean this away in order to allow this to heal appropriately. It does appear that this may actually be open underneath the blister. Patient History Information obtained from Patient. Family History Cancer - Mother,Siblings, Diabetes - Mother, Heart Disease - Mother, Hypertension - Mother, Lung Disease - Father, No family history of Hereditary Spherocytosis, Kidney Disease, Seizures, Stroke, Thyroid Problems, Tuberculosis. Social History Never smoker, Marital Status - Widowed, Alcohol Use - Never, Drug Use - No History, Caffeine Use - Rarely. Medical History Eyes Patient has history of Cataracts - removed on left eye Denies history of Glaucoma, Optic Neuritis Ear/Nose/Mouth/Throat Denies history of Chronic sinus problems/congestion, Middle ear problems Hematologic/Lymphatic Denies history of Anemia, Hemophilia, Human Immunodeficiency Virus, Lymphedema, Sickle Cell Disease Respiratory Denies history of Aspiration, Asthma, Chronic Obstructive Pulmonary Disease (COPD), Pneumothorax, Sleep Apnea, Tuberculosis Cardiovascular Patient has history of Hypertension - HCTZ, Denies history of Angina, Arrhythmia,  Congestive Heart Failure, Coronary Artery Disease, Deep Vein Thrombosis, Hypotension, Myocardial Infarction, Peripheral Arterial Disease, Peripheral Venous Disease, Phlebitis, Vasculitis Gastrointestinal Denies history of Cirrhosis , Colitis, Crohnoos, Hepatitis A, Hepatitis B, Hepatitis C Endocrine Patient has history of Type II Diabetes - Since age 70 Denies history of Type I Diabetes Genitourinary Denies history of End Stage Renal Disease Immunological Denies history of Lupus Erythematosus, Raynaudoos, Scleroderma Integumentary (Skin) Denies history of History of Burn Musculoskeletal Patient has history of Rheumatoid Arthritis - 20 years Denies history of Gout, Osteoarthritis, Osteomyelitis Neurologic Denies history of Dementia, Neuropathy, Quadriplegia, Paraplegia, Seizure Disorder Oncologic Denies history of Received Chemotherapy, Received Radiation Psychiatric Denies history of Anorexia/bulimia, Confinement Anxiety Medical And Surgical History Notes Endocrine hypothyroid Psychiatric Schizotypal personality disorder Review of Systems (ROS) Constitutional Symptoms (General Health) Denies complaints or symptoms of Fatigue, Fever, Chills, Marked Weight Change. Respiratory Denies complaints or symptoms of Chronic or frequent coughs, Shortness of Breath. Cardiovascular Denies complaints or symptoms of Chest pain. Psychiatric Denies complaints or symptoms of Claustrophobia, Suicidal. Objective Constitutional Well-nourished and well-hydrated in no acute distress. Vitals Time Taken: 1:11 PM, Height: 62 in, Weight: 140 lbs, BMI: 25.6, Temperature: 98.1 F, Pulse: 79 bpm, Respiratory Rate: 18 breaths/min, Blood Pressure: 141/76 mmHg. Respiratory normal breathing without difficulty. Psychiatric this patient is able to make decisions and demonstrates good insight into disease process. Alert and Oriented x 3. pleasant and cooperative. General Notes: Patient's wound bed  currently showed signs of good granulation at this time. Fortunately there is no signs of active infection. I did remove the blistered tissue as well as some slough and biofilm from the surface of the wound down to good subcutaneous tissue. This actually formed somewhat of a T shape post debridement. This seems to be doing quite well which is good news. Fortunately there is no signs of active infection. Integumentary (Hair, Skin) Wound #19 status is Open. Original cause of wound was Gradually Appeared. The wound is located on the [place]. The wound measures 0.7cm length x 1cm width x 0.1cm depth; 0.55cm^2 area and 0.055cm^3 volume. There is Fat Layer (Subcutaneous Tissue) Exposed exposed. There is no tunneling or undermining noted. There is a medium amount of serosanguineous drainage noted. The wound margin is flat and intact. There is large (67-100%) pink granulation within the wound bed. There is a small (1-33%) amount of necrotic tissue within the wound bed including Adherent Slough. 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 There was a Excisional Skin/Subcutaneous Tissue Debridement with a total area of 8.7 sq cm performed by Worthy Keeler, PA. With the following instrument(s): Curette, Forceps, and Scissors to remove Viable and Non-Viable tissue/material. Material removed includes Subcutaneous Tissue, Slough, and Skin: Epidermis. No specimens were taken. A time out was conducted at 13:30,  prior to the start of the procedure. A Minimum amount of bleeding was controlled with Pressure. The procedure was tolerated well with a pain level of 3 throughout and a pain level of 1 following the procedure. Post Debridement Measurements: 2.9cm length x 3cm width x 0.1cm depth; 0.683cm^3 volume. Character of Wound/Ulcer Post Debridement is improved. Severity of Tissue Post Debridement is: Fat layer exposed. Post procedure Diagnosis Wound #19: Same as Pre-Procedure Plan Follow-up Appointments: Return Appointment in 2 weeks. Dressing Change Frequency: Wound #19 Left,Plantar Foot: Other: - Change 2 times per week Skin Barriers/Peri-Wound Care: Wound #19 Left,Plantar Foot: Moisturizing lotion - to both legs Wound Cleansing: Wound #19 Left,Plantar Foot: Clean wound with Wound Cleanser Primary Wound Dressing: Wound #19 Left,Plantar Foot: Calcium Alginate with Silver Secondary Dressing: Wound #19 Left,Plantar Foot: Foam - donut Kerlix/Rolled Gauze Dry Gauze - or foam border Edema Control: Wound #19 Left,Plantar Foot: Avoid standing for long periods of time Elevate legs to the level of the heart or above for 30 minutes daily and/or when sitting, a frequency of: - throughout the day Support Garment 20-30 mm/Hg pressure to: - juxtalite to both legs daily Off-Loading: Open toe surgical shoe to: - left foot with felt. Home Health: [facility] for wound care. - [facility]. I would recommend currently that we go ahead and continue with the current wound care measures which includes the silver alginate dressing at this point. I do believe that is doing well for her and hopefully should continue to do such. 2. I am also can recommend that we go ahead and continue with the foam doughnut to offload as well as the postop shoe which hopefully will continue to reduce some of the pressure to this area as far as allowing this to heal is concerned. 3. I will also  recommend that we go ahead and have the patient continue to wear her juxta lite wraps as well as offloading and elevation all of which I think is important for her. We will see patient back for reevaluation in 2 weeks here in the clinic. If anything worsens or changes patient will contact our office for additional recommendations. Electronic Signature(s) Signed: 07/07/2019 5:11:42 PM By: Worthy Keeler PA-C Entered By: Worthy Keeler on 07/07/2019 13:43:47 -------------------------------------------------------------------------------- HxROS Details Patient Name: Date of Service: [name]. 07/07/2019 12:30 PM Medical Record [id number] Patient Account Number: [id number] Date of Birth/Sex: Treating RN: 1946/04/18 (73 y.o. [name] Primary Care Provider: Chrisandra Netters Other Clinician: Referring Provider: Treating Provider/Extender:Stone III, Anabel Bene, Ivan Anchors in Treatment: 52 Information Obtained From Patient Constitutional Symptoms (General Health) Complaints and Symptoms: Negative for: Fatigue; Fever; Chills; Marked Weight Change Respiratory Complaints and Symptoms: Negative for: Chronic or frequent coughs; Shortness of Breath Medical History: Negative for: Aspiration; Asthma; Chronic Obstructive Pulmonary Disease (COPD); Pneumothorax; Sleep Apnea; Tuberculosis Cardiovascular Complaints and Symptoms: Negative for: Chest pain Medical History: Positive  for: Hypertension - HCTZ, Negative for: Angina; Arrhythmia; Congestive Heart Failure; Coronary Artery Disease; Deep Vein Thrombosis; Hypotension; Myocardial Infarction; Peripheral Arterial Disease; Peripheral Venous Disease; Phlebitis; Vasculitis Psychiatric Complaints and Symptoms: Negative for: Claustrophobia; Suicidal Medical History: Negative for: Anorexia/bulimia; Confinement Anxiety Past Medical History Notes: Schizotypal personality disorder Eyes Medical History: Positive for:  Cataracts - removed on left eye Negative for: Glaucoma; Optic Neuritis Ear/Nose/Mouth/Throat Medical History: Negative for: Chronic sinus problems/congestion; Middle ear problems Hematologic/Lymphatic Medical History: Negative for: Anemia; Hemophilia; Human Immunodeficiency Virus; Lymphedema; Sickle Cell Disease Gastrointestinal Medical History: Negative for: Cirrhosis ; Colitis; Crohns; Hepatitis A; Hepatitis B; Hepatitis C Endocrine Medical History: Positive for: Type II Diabetes - Since age 17 Negative for: Type I Diabetes Past Medical History Notes: hypothyroid Time with diabetes: 65 Treated with: Diet Blood sugar tested every day: No Genitourinary Medical History: Negative for: End Stage Renal Disease Immunological Medical History: Negative for: Lupus Erythematosus; Raynauds; Scleroderma Integumentary (Skin) Medical History: Negative for: History of Burn Musculoskeletal Medical History: Positive for: Rheumatoid Arthritis - 20 years Negative for: Gout; Osteoarthritis; Osteomyelitis Neurologic Medical History: Negative for: Dementia; Neuropathy; Quadriplegia; Paraplegia; Seizure Disorder Oncologic Medical History: Negative for: Received Chemotherapy; Received Radiation HBO Extended History Items Eyes: Cataracts Immunizations Pneumococcal Vaccine: Received Pneumococcal Vaccination: Yes Tetanus Vaccine: Last tetanus shot: 10/22/2004 Immunization Notes: doesn't remember when last tetanus Implantable Devices No devices added Family and Social History Cancer: Yes - Mother,Siblings; Diabetes: Yes - Mother; Heart Disease: Yes - Mother; Hereditary Spherocytosis: No; Hypertension: Yes - Mother; Kidney Disease: No; Lung Disease: Yes - Father; Seizures: No; Stroke: No; Thyroid Problems: No; Tuberculosis: No; Never smoker; Marital Status - Widowed; Alcohol Use: Never; Drug Use: No History; Caffeine Use: Rarely; Financial Concerns: Yes; Food, Clothing or Shelter Needs: No;  Support System Lacking: No; Transportation Concerns: No Physician Affirmation I have reviewed and agree with the above information. Electronic Signature(s) Signed: 07/07/2019 5:11:42 PM By: Worthy Keeler PA-C Signed: 07/07/2019 5:42:21 PM By: Baruch Gouty RN, BSN Entered By: Worthy Keeler on 07/07/2019 13:39:23 -------------------------------------------------------------------------------- SuperBill Details Patient Name: Date of Service: [name] 07/07/2019 Medical Record [id number] Patient Account Number: [id number] Date of Birth/Sex: Treating RN: [birth year] (73 y.o. [name] Primary Care Provider: Chrisandra Netters Other Clinician: Referring Provider: Treating Provider/Extender:Stone III, Anabel Bene, Ivan Anchors in Treatment: 52 Diagnosis Coding ICD-10 Codes Code Description E11.42 Type 2 diabetes mellitus with diabetic polyneuropathy L97.518 Non-pressure chronic ulcer of other part of right foot with other specified severity L97.521 Non-pressure chronic ulcer of other part of left foot limited to breakdown of skin L02.612 Cutaneous abscess of left foot S80.812D Abrasion, left lower leg, subsequent encounter Facility Procedures The patient participates with Medicare or their insurance follows the Medicare Facility Guidelines: CPT4 Code Description Modifier Quantity [id number] 11042 - DEB SUBQ TISSUE 20 SQ CM/< 1 ICD-10 Diagnosis Description L97.518 Non-pressure chronic ulcer of  other part of right foot with other specified severity Physician Procedures CPT4 Code Description: [id number] [id number] - WC PHYS SUBQ TISS 20 SQ CM ICD-10 Diagnosis Description L97.518 Non-pressure chronic ulcer of other part of right foot with o Modifier: ther specifie Quantity: 1 d severity Electronic Signature(s) Signed: 07/07/2019 5:11:42 PM By: Worthy Keeler PA-C Entered By: Worthy Keeler on 07/07/2019 13:43:58

## 2019-07-08 DIAGNOSIS — L97421 Non-pressure chronic ulcer of left heel and midfoot limited to breakdown of skin: Secondary | ICD-10-CM | POA: Diagnosis not present

## 2019-07-08 DIAGNOSIS — E1142 Type 2 diabetes mellitus with diabetic polyneuropathy: Secondary | ICD-10-CM | POA: Diagnosis not present

## 2019-07-08 DIAGNOSIS — E11621 Type 2 diabetes mellitus with foot ulcer: Secondary | ICD-10-CM | POA: Diagnosis not present

## 2019-07-08 DIAGNOSIS — E039 Hypothyroidism, unspecified: Secondary | ICD-10-CM | POA: Diagnosis not present

## 2019-07-08 DIAGNOSIS — I10 Essential (primary) hypertension: Secondary | ICD-10-CM | POA: Diagnosis not present

## 2019-07-09 ENCOUNTER — Other Ambulatory Visit: Payer: Self-pay

## 2019-07-09 ENCOUNTER — Ambulatory Visit (HOSPITAL_COMMUNITY)
Admission: RE | Admit: 2019-07-09 | Discharge: 2019-07-09 | Disposition: A | Discharge status: Home / Self Care | Payer: Medicare Other | Source: Ambulatory Visit | Attending: Nephrology | Admitting: Nephrology

## 2019-07-09 VITALS — BP 122/74 | HR 78 | Temp 97.2°F | Resp 20

## 2019-07-09 DIAGNOSIS — D508 Other iron deficiency anemias: Secondary | ICD-10-CM | POA: Diagnosis not present

## 2019-07-09 LAB — POCT HEMOGLOBIN-HEMACUE: Hemoglobin: 12 g/dL (ref 12.0–15.0)

## 2019-07-09 LAB — FERRITIN: Ferritin: 1274 ng/mL — ABNORMAL HIGH (ref 11–307)

## 2019-07-09 LAB — IRON AND TIBC
Iron: 48 ug/dL (ref 28–170)
Saturation Ratios: 38 % — ABNORMAL HIGH (ref 10.4–31.8)
TIBC: 126 ug/dL — ABNORMAL LOW (ref 250–450)
UIBC: 78 ug/dL

## 2019-07-09 MED ORDER — EPOETIN ALFA-EPBX 40000 UNIT/ML IJ SOLN
40000.0000 [IU] | INTRAMUSCULAR | Status: DC
  Filled 2019-07-09: qty 1

## 2019-07-12 DIAGNOSIS — E1142 Type 2 diabetes mellitus with diabetic polyneuropathy: Secondary | ICD-10-CM | POA: Diagnosis not present

## 2019-07-12 DIAGNOSIS — I10 Essential (primary) hypertension: Secondary | ICD-10-CM | POA: Diagnosis not present

## 2019-07-12 DIAGNOSIS — E11621 Type 2 diabetes mellitus with foot ulcer: Secondary | ICD-10-CM | POA: Diagnosis not present

## 2019-07-12 DIAGNOSIS — E039 Hypothyroidism, unspecified: Secondary | ICD-10-CM | POA: Diagnosis not present

## 2019-07-12 DIAGNOSIS — L97421 Non-pressure chronic ulcer of left heel and midfoot limited to breakdown of skin: Secondary | ICD-10-CM | POA: Diagnosis not present

## 2019-07-15 DIAGNOSIS — I10 Essential (primary) hypertension: Secondary | ICD-10-CM | POA: Diagnosis not present

## 2019-07-15 DIAGNOSIS — E1142 Type 2 diabetes mellitus with diabetic polyneuropathy: Secondary | ICD-10-CM | POA: Diagnosis not present

## 2019-07-15 DIAGNOSIS — E11621 Type 2 diabetes mellitus with foot ulcer: Secondary | ICD-10-CM | POA: Diagnosis not present

## 2019-07-15 DIAGNOSIS — L97421 Non-pressure chronic ulcer of left heel and midfoot limited to breakdown of skin: Secondary | ICD-10-CM | POA: Diagnosis not present

## 2019-07-15 DIAGNOSIS — E039 Hypothyroidism, unspecified: Secondary | ICD-10-CM | POA: Diagnosis not present

## 2019-07-19 ENCOUNTER — Other Ambulatory Visit: Payer: Self-pay

## 2019-07-19 ENCOUNTER — Other Ambulatory Visit: Payer: Medicare Other

## 2019-07-19 DIAGNOSIS — E039 Hypothyroidism, unspecified: Secondary | ICD-10-CM | POA: Diagnosis not present

## 2019-07-19 DIAGNOSIS — I10 Essential (primary) hypertension: Secondary | ICD-10-CM | POA: Diagnosis not present

## 2019-07-19 DIAGNOSIS — E11621 Type 2 diabetes mellitus with foot ulcer: Secondary | ICD-10-CM | POA: Diagnosis not present

## 2019-07-19 DIAGNOSIS — E1142 Type 2 diabetes mellitus with diabetic polyneuropathy: Secondary | ICD-10-CM | POA: Diagnosis not present

## 2019-07-19 DIAGNOSIS — E1165 Type 2 diabetes mellitus with hyperglycemia: Secondary | ICD-10-CM

## 2019-07-19 DIAGNOSIS — L97421 Non-pressure chronic ulcer of left heel and midfoot limited to breakdown of skin: Secondary | ICD-10-CM | POA: Diagnosis not present

## 2019-07-20 LAB — LIPID PANEL
Chol/HDL Ratio: 2.1 ratio (ref 0.0–4.4)
Cholesterol, Total: 101 mg/dL (ref 100–199)
HDL: 47 mg/dL (ref >39)
LDL Chol Calc (NIH): 42 mg/dL (ref 0–99)
Triglycerides: 46 mg/dL (ref 0–149)
VLDL Cholesterol Cal: 12 mg/dL (ref 5–40)

## 2019-07-20 LAB — TSH: TSH: 7.36 u[IU]/mL — ABNORMAL HIGH (ref 0.450–4.500)

## 2019-07-21 ENCOUNTER — Telehealth: Payer: Self-pay | Admitting: Family Medicine

## 2019-07-21 ENCOUNTER — Encounter (HOSPITAL_BASED_OUTPATIENT_CLINIC_OR_DEPARTMENT_OTHER): Payer: Medicare Other | Attending: Physician Assistant | Admitting: Physician Assistant

## 2019-07-21 ENCOUNTER — Other Ambulatory Visit: Payer: Self-pay

## 2019-07-21 DIAGNOSIS — L97511 Non-pressure chronic ulcer of other part of right foot limited to breakdown of skin: Secondary | ICD-10-CM | POA: Diagnosis not present

## 2019-07-21 DIAGNOSIS — E039 Hypothyroidism, unspecified: Secondary | ICD-10-CM

## 2019-07-21 DIAGNOSIS — L97211 Non-pressure chronic ulcer of right calf limited to breakdown of skin: Secondary | ICD-10-CM | POA: Diagnosis not present

## 2019-07-21 DIAGNOSIS — E11621 Type 2 diabetes mellitus with foot ulcer: Secondary | ICD-10-CM | POA: Insufficient documentation

## 2019-07-21 DIAGNOSIS — L97521 Non-pressure chronic ulcer of other part of left foot limited to breakdown of skin: Secondary | ICD-10-CM | POA: Insufficient documentation

## 2019-07-21 DIAGNOSIS — L97529 Non-pressure chronic ulcer of other part of left foot with unspecified severity: Secondary | ICD-10-CM | POA: Diagnosis not present

## 2019-07-21 NOTE — Telephone Encounter (Signed)
Attempted to reach patient by phone to discuss labs. No answer. LVM asking her to call back.  [name], MD

## 2019-07-22 NOTE — Progress Notes (Signed)
Kari, Keller ([id number]) Visit Report for 07/21/2019 Chief Complaint Document Details Patient Name: Date of Service: [name], [name] 07/21/2019 12:30 PM Medical Record [id number] Patient Account Number: [id number] Date of Birth/Sex: Treating RN: June 20, 1946 (73 y.o. F) Primary Care Provider: Chrisandra Netters Other Clinician: Referring Provider: Treating Provider/Extender:Stone III, Anabel Bene, Ivan Anchors in Treatment: 0 Information Obtained from: Patient Chief Complaint Patients presents for treatment of an open diabetic ulcer to the left foot which she's had for about 2 weeks now 09/19/17; patient is here for review of blistered areas on the right leg 2 and on the left leg 1 which have been present for the last week 07/03/2018; patient comes back to clinic for review of wounds on her bilateral lower extremities Electronic Signature(s) Signed: 07/22/2019 10:36:32 AM By: Worthy Keeler PA-C Entered By: Worthy Keeler on 07/21/2019 13:04:10 -------------------------------------------------------------------------------- HPI Details Patient Name: Date of Service: [name]. 07/21/2019 12:30 PM Medical Record [id number] Patient Account Number: [id number] Date of Birth/Sex: Treating RN: December 15, 1945 (73 y.o. F) Primary Care Provider: Chrisandra Netters Other Clinician: Referring Provider: Treating Provider/Extender:Stone III, Anabel Bene, Ivan Anchors in Treatment: 0 History of Present Illness HPI Description: 73 year old patient who is known to our practice from May of last year was fully worked up with a venous and arterial duplex study and was referred to the vascular surgeon for follow-up. The patient says she has seen the vascular surgeons and has an appointment back in April. No procedure was done. She has developed a blister on the left big toe and forefoot which has been fairly large and draining fluid and she self-referred herself to [place]. The  patient was recently seen in the ER on 10/06/2016, and was treated with clindamycin and asked to see the podiatrist and PCP. She saw the podiatrist Dr. Felisa Bonier on 10/11/2016 who I understand did an x-ray but no report is available. Of note last year when I saw her, I had referred her to do Dr. Ruta Hinds -- he saw on 02/04/2016. he reviewed and noted that she had evidence of peripheral arterial disease and recommended a follow-up in 6 months for repeat noninvasive arterial exam. Wounds worsened he would consider an angiogram. He saw her back on 03/14/2016, and continued conservative treatment and asked her to come back for noninvasive studies in 6 months. 12/03/2016 -- was seen in the office on 11/28/2016 and note is made of the fact that venous duplex examination on 01/02/2016 showed no evidence of reflux. Did have evidence of peripheral arterial disease and was asked to follow- up in 6 months time. Recent data on 11/28/2016 showed right noncompressible with monophasic waveforms left noncompressible with monophasic waveforms. The thought was she has medial calcifications of the arteries and had bilateral toe brachial indices which were normal. Right toe brachial index was 0.74 and the left toe brachial index was 0.80 She would return for a vascular study in 6 months when her ABI would be checked again. 7/6-Patient is back at 1 week visit to the clinic, home health is attending to the wound once a week, we have been using silver alginate, MRI scheduled this week on Thursday for the left foot. We are seeing her for the left foot plantar ulcer and the right great toe ulcer 12/24/2016 -- the left plantar heel had a large callus which came off during her evaluation and there was an open ulcer at the base ==== Old notes She was recently seen by her PCP for an ulcer of the  left toe which was treated at the urgent care by giving her ciprofloxacin which she has been taking. She has uncontrolled  diabetes and A1c was 13.7 done last week. Past medical history significant for poorly controlled diabetes mellitus type 2, obesity, osteopenia, GERD, diabetic retinopathy, diabetic neuropathy, reported arthritis, hypothyroidism, hypertension. No x-rays were done recently. 12/27/2015 -- had an x-ray of the left foot --IMPRESSION:Juxta-articular erosions at the IP joint great toe and diffusely in the MTP joints greatest at first, suggesting an inflammatory arthropathy such as rheumatoid arthritis or gout.While septic arthritis can cause juxta-articular erosions, the multiplicity of joints involved makes this less likely. Significant soft tissue swelling with single tiny questionable focus of soft tissue gas medial to the first metatarsal head. If patient has persistent symptoms or persistent clinical concern for osteomyelitis, recommend MR imaging of the LEFT foot (with and without contrast if renal function permits). 01/03/2016 -- the lower extremity venous duplex reflex evaluation showed no evidence of deep or superficial thrombus or reflux. The arterial study done and showed the resting right ABI is greater than 1.3 and the left ABI was greater than 1.3 indicating arterial medial calcification. The right TBI was less than 0.64 and the left ABI was less than 0.64 both of which are abnormal. except for the left posterior tibial which is triphasic on the flow is biphasic. With the above results due to the fact that she has diabetes mellitus and would recommend she sees the vascular surgeon to see if any further intervention or procedure needs to be planned. ====== READMISSION 09/19/17;this is a 73 year old woman that we've had in our clinic previously for wounds on predominantly her left foot left toe and left heel. She is a diabetic. She has had lower extremity workups for both venous reflux disease and arterial disease. She has known noncompressible vessels bilaterally however in May 2017 as  noted above her TBI was 0.64 and the left TBI was less than 0.64. When she was last in clinicin May 2017 the left heel wound had closed. She was faithfully wearing her juxta light stockings. She tells me 2-3 weeks ago she developed increasing edema in her legs. She was seen by Dr. Valentino Saxon of nephrology who apparently increased her Lasix from 40-80 mg twice a day. The patient has not noticed any improvement in the edema. She does not weigh herself. About a week ago she noted blisters on her legs on the right 2 on the left 1 since then she has not been wearing the juxta light stockings. She has not had any pain no shortness of breath 09/26/17;; the patient has a stable wound as a result of blistering on the left anterior calf and 2 on the right anterior and right medial. There is another denuded blister on the left anterior more superior. This does not have any current fluid I did not remove the skin today. We've been using silver alginate under compression 10/03/17; the patient's right leg is healed. The area on the left anterior leg all wound sites look better. We've been using silver alginate under compression. In questioning the patient apparently has a juxta light stockings however she has not been using these. We asked her to find them lubricate her skin every night and used a juxta light stocking on the right. I would like to see this next time 10/10/17; both the patient's areas on the left anterior leg 2 are closed the area on the look right posterior medial calf remains closed. She is been using  a juxta light stocking on the right she has one for the left Readmission Mrs. Joya Gaskins is now a 73 year old woman we have had in this clinic at least 3 times before. Most recently she was here in February 2019 for 2 visits with wounds on her bilateral lower extremities because of blisters these healed fairly quickly and we discharged her in juxta lite stockings. She was previously here in 2018 with a  wound on her left foot and in 2017 she was sent to see Dr. Oneida Alar for follow-up of PAD. Indeed she seems to have had follow-up noninvasive vascular studies every 6 months. Her current problems began at the end of October she apparently suffered a fall with a laceration on her right anterior leg. She had this sutured on 06/23/2018. Sometime in the same timeframe she developed blistering over a large area of the left anterior calf, the right great toe. The blisters have spontaneously ruptured and she has large areas of the epithelial loss. She also has non-open blisters on the medial part of the left great toe. There is also an open area on the right second toe. I do not believe that she was using her compression stockings [not juxta lites] because the swelling in her legs had gotten too large for her to wrap. She tells me she is recently been to her nephrologist and had her Lasix increased which is helped somewhat with the swelling. The patient did not have arterial studies done in our clinic. Her most recent noninvasive studies were in August. These showed ABIs noncompressible bilaterally. She had TBI's on the right at 0.54 and on the left at 0.60 biphasic waveforms at the PTA and DP bilaterally. She was felt to have bilateral ABIs that were unchanged from her previous study in October 2018. However it was noted her TBI's were decreased. Past medical history includes type 2 diabetes with peripheral neuropathy with a recent hemoglobin A1c of 7. She is apparently off her diabetes medications, she has stage III chronic renal failure, hyperlipidemia, hypertension, cataracts, retinopathy, rheumatoid arthritis, iron deficiency and apparently is receiving IV iron, urge incontinence 07/10/2018; she arrives today with everything looking quite a bit better. The large area of blistered denuded epithelium on the left calf has healed over. The area on the right lateral tibial area is also mostly healed still  with a linear open area where her laceration was. The area on her left medial toe is just about healed. She has a dark thick black eschar over the tip of her right first toe. I do not remember this from last week this feels almost like the eschar you would see with ischemia yet her toes are warm and her peripheral pulses are palpable. She does have some degree of PAD but I do not think that was sufficient enough to have caused this. 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 Patient originally came here with a laceration injury of her right lateral mid calf. She also had an area of denuded skin on the left which is since closed over. She had a small area on the tip of her right great toe as well. She arrives today with what looks to be dry gangrene on the right great toe as well as discoloration of the tips of her second and third toes. She is not in any pain. She also had purulent drainage coming out open area on her left midfoot which was new this week. Culture was obtained  The patient is known to vascular surgery having last been seen on 04/02/2018. They follow her for chronic arterial insufficiency. Last arterial studies were in August 2017 at which time her ABIs were noncompressible however her TBI was 0.54 on the right and 0.60 on the left. She was noted to have bilateral ankle arteries remain calcified. Waveforms biphasic. Slight decline in bilateral TBI's 07/24/2018; the patient was graciously seen urgently by vein and vascular with regards to critical limb ischemia, she had a CO2 angiogram and then contrast to look at the lower extremity arteries. Surprisingly no significant vascular disease was noted in the aortic iliac set segment, patent common femoral and profunda arteries patent popliteal and three-vessel runoff with all 3 of the tibials present. Her dorsalis pedis and plantar arch vessels also filled. Notable for the fact that it was felt  that she had small vessel disease near her toes but this was not amenable for intervention. The abscess that I unroofed last week on the plantar left foot grew Stenotrophomonas Maltophilia. This is not a usual true wound pathogen. However it had abundant organisms and given the fact that is an abscess I went ahead and treated this with Levaquin 250 daily for 7 days. I had her on doxycycline last week 07/31/2018; I put 3 layer compression on the right leg last week. She arrives in today with almost no edema in the right leg and out of concern for the this in the gangrenous toes I am going to reduce this back to Kerlix and Coban which is what we had her on at the beginning. We are using silver alginate to the traumatic wound on her right anterior leg and this looks a lot better. The area on the left foot is healed and the patient is using her own wraparound compression stocking here. She still has the dry gangrene-like changes predominantly involving the right first toe, tip of her right second and third toes. We are using Betadine here. The exact etiology of this was not completely clear 08/07/18; we've been using 2 layer compression on the right leg. She still has the gangrenous toes for second and third on the right and the original laceration injury. She does not have any significant vascular issues to the level of the ankle. She does have small vessel disease in her toes.The left mid foot wound is closed 08/14/18; the patient arrives in clinic today with improvement in the original area on her right lateral leg. The 3 dry gangrenous toes first second and third on the right all look stable except for the second. This was obviously separating. It was removed fortunately there is healthy-looking surface tissue underneath this. She arrives in clinic with a reopening on the left midfoot. This was previously a small abscess that had closed. She has Charcot feet bilaterally 08/24/2018; right lateral  leg wound is smaller. The 3 dry gangrenous first second and third toes are a bit changed. The third toe was separating I remove the eschar and subcutaneous tissue to reveal a small open area at the tip of the toe just at the base at the head of the nail. The first toe is unchanged. Left plantar foot is larger. This is in the Charcot foot area. I removed nonviable circumference and surface from the wound using silver alginate 08/31/2018; right lateral leg wound continues to contract also the area on her plantar foot. First great toe is beginning to separate and the nail will soon I think fall off.. She sees vein  and vascular tomorrow. 1/20; right lateral leg wound continues to contract and is almost closed first great toe is beginning to separate. The area on her left plantar foot still has rolled edges around the wound and some debris in the surface but generally smaller. We have been using silver alginate to the 2 remaining wound areas and Betadine to the left great toe. The patient saw vein and vascular on 1/14. They did not add anything here. Noted that the patient has microvascular disease but no major macrovascular issues. No intervention was performed during her CO2 aortogram on 07/23/2018. 2/3; 2-week follow-up. Right leg wound which is her original wound and coming here is healed. Second and third right toes have healed. She still has dry eschared/dry gangrene over the right first toe. I thought this might begin to separate but it really is not making great headway with that. The area on the left midfoot reopened 2 to 3 weeks ago and this is made no progress. Finally she has new blisters on the left first toe laterally and the left second toe dorsally over the DIP at the base of the nail 2/17; 2-week follow-up. Right leg wound which is her original wound is healed. Right second and third toes are still healed. She still has the black ischemic eschar over the first toe which has not really  begun to separate. The area on the left midfoot is unfortunately worse with large amounts of denuded skin around this. She has blisters on the medial part of her first toe and the dorsal part of the second toe on the left 2/24; she still has the black ischemic eschar over the first toe which is not really separating. The area on the left midfoot seems stable from last week. The blisters on the medial part of her first toe and dorsal second toe had hardened and I removed both of these there are small open areas underneath. 3/2; left first and second toes are healed. The area on the left plantar foot looks better. Finally the right great toe area is beginning to separate. 3/9; left first and second toes remain healed. Left plantar foot continues to look excellent smaller with a healthy base. Finally the right great toe had separated further. I went ahead and remove the eschar here. There is still a fairly sizable wound here but this will give [place] a better chance to address this. 3/16-Left first and second toes mostly healed although the left first toe appears to have an open area Right great toe deep ulcer with exposed bone, greenish debris with necrotic tissue, right second toe wound with clean edges and base. Patient had silver alginate dressing to all the toes especially on the right. SHe has been offloading with inserts and open toe shoes 3/23; since I last saw this patient 2 weeks ago there is been quite a bit of deterioration. The left plantar foot continues to look about the same. She now has an area on the left lateral first toe. On the right foot the first toe has a fair amount of exposed bone. X-ray that we did last week did not show osteomyelitis nevertheless that would have to be a concern. She also has an area on the tip of the right second toe that is new 3/30; some improvement in the left plantar foot. Bone I took out of the right first toe last week did not show osteomyelitis under  pathology however culture did grow rare staph aureus and rare Citrobacter. We have  been using silver alginate to all her wounds 4/6; left plantar foot wound about the same although this does not look ominous. The right first toe which is been very problematic does not have exposed bone aggressive debridement of the surface of this. She does have grams of epithelialization. We have been using silver alginate on both wounds 4/13; left plantar foot wound about the same. Removed skin and subcutaneous tissue from the wound circumference. The right great toe has underlying osteomyelitis I have extended her Levaquin 500 mg for another 2 weeks. She does not complain of any overt side effects. 4/23;. The patient has wounds on the left plantar foot, left lateral great toe and a new area on the dorsal part of the right second toe. Her large area over the tip of her right great toe. She is on Levaquin I will need to check on when we want to finish this. This is for underlying osteomyelitis of the right great toe 5/4; the patient has or should be completing her Levaquin at this point. This is for osteomyelitis in the right great toe. She also has wounds on her left plantar foot and the dorsal part of her right second toe. We have been using silver alginate 5/11; patient arrives today with her right great toe generally looking better. However the area on the plantar left foot had considerable undermining and discomfort around the wound and some erythema. We have been using silver alginate 5/18-Patient returns at 1 week the right great toe is much more macerated around the edges of the wound, the left foot wounds are considerably better especially the left great toe and left second toe. We have been using silver alginate to all wounds and juxta lite to the right leg and 2 layer compression to the left. THere is now a fluid filled bleb on the posterior calf of RLE 5/26; right great toe continues to improve.  The underlying osteomyelitis was treated empirically in this clinic. Left midfoot wound also looks better than the last time I saw this 2 weeks ago. Apparently last week she had a blister on the right posterior calf that was largely. This was not specifically addressed. It is open since last time she has a large area on the right posterior mid calf with skin attached to a large amount of the circumference. 6/1; we continue to have true improvements in both of the wound areas on the right great toe and left plantar foot. The large blister on the posterior right calf is just about fully epithelialized although there is still some weeping edema in this area 6/8; we continued to have been improvements in both areas currently on the right great toe and left plantar foot. The large blister on the posterior right calf is fully healed. 6/15; we we continue to have improvements in the right great toe. The left plantar foot which was a small wound without undermining last week has decided to expand into the lateral foot. There is some swelling in this area. She is not systemically unwell 6/22; the culture of her left foot that I did last week grew Enterobacter I had her on doxycycline. There was also clearly some purulent drainage coming from this today through the original wound with the swelling on the lateral part of her foot. She is not systemically unwell. 6/25 the patient is on Levaquin for the Enterobacter cultured out of her left foot. She now has an extensive wound area in the medial left foot we are using  silver alginate The patient complains of pain in the foot going up towards the metatarsal head. There is also swelling inferiorly to this area that we identified last week but this is not tender 6/29; the patient is completing her Levaquin for the Enterobacter. We are using silver alginate to the extensive wound on the left plantar foot as well as to the right great toe 7/14; the right great  toe is healed today. The area on the left foot looks better. The MRI of the left foot did not show osteomyelitis in the area of the wound however it did show an effusion in the tibiotalar joint. The patient has rheumatoid arthritis 7/20; right great toe remains healed. The area on the left foot continues to look improved. 7/27 right great toe remains healed. The area on the left foot continues to contract. Healthy granulation tissue she is only offloading this in surgical shoes. 03/24/2019 on evaluation today patient appears to be doing very well with regard to her plantar foot ulcer. She has been tolerating the dressing changes without complication. She has a healthy granulation surface. There is no signs of active infection at this time and her swelling seems to be under good control. 8/17; the areas on the left medial midfoot. Probably subluxed bone in this area. Wounds are superficial. It looks as though there was some blistering skin at one point 8/24; the area on the left medial midfoot. This is completely resolved at this point although it still looks somewhat vulnerable. She probably has subluxed bone underneath this area. She has pes planus deformity 9/14; apparently this wound reopened in the 3 weeks since I saw her. She was seen last week she is given new healing sandals. Been using calcium alginate with felt offloading. This is not surprising given the area looks somewhat vulnerable. 9/28; left plantar foot in the setting of diabetic neuropathy probably some degree of Charcot deformity. She also has abrasions on the left leg which she says are due to the Curlex co-band that was put on by home health. She is also having a lot of pain in the left calf finds the wrap too tight 10/5; left plantar foot has 2 superficial open areas. The abrasions on the left leg from last week are healed. She still has some swelling and tenderness however. Duplex ultrasound of the leg tomorrow  tomorrow. 10/13; patient arrived with a deterioration in the left foot from last week. Undermining wound probably connecting the 2 superficial areas from last week. She wears a surgical shoe with felt offloading. Duplex ultrasound she had last week showed a ruptured Baker's cyst. Her leg looks a lot better 10/20; 2 remanent open areas in the left foot. This looks better than last time. We are using silver alginate. She wears a surgical shoe for offloading there is not a more aggressive option 10/26; the patient does not have any open on the left foot we are using silver alginate to the medial aspect of the foot. She wears a surgical shoe on both feet. Juxta lite stocking on the right, she has 1 for the left. This is been a long haul for this patient. Initially coming in with a wound on her right lateral calf. Then her right great toe. More recently areas on the left medial foot 06/30/2019 on evaluation today this is a patient that I regularly do not see but unfortunately she has a wound on the plantar aspect of her foot which I have seen before that appeared to  likely be healed last week although it reopened before she even got to the end of the week. This is mainly on the plantar aspect of her foot. She is really not been able to find any sandals that Dr. Dellia Nims was talking to her about as far as going forward what she should be wearing. Fortunately there is no evidence of active infection at this time which is good news. No fevers, chills, nausea, vomiting, or diarrhea. 07/07/2019 on evaluation today patient appears to be doing very well in regard to her foot ulcer from the standpoint of the original wound opening. Unfortunately the biggest issue I see right now she does seem to have a sinus tract along with a blister off the medial portion of her foot which is going to be giving her trouble at this point. We will continue to clean this away in order to allow this to heal appropriately. It does  appear that this may actually be open underneath the blister. 07/21/2019 on evaluation today patient actually appears to be completely healed and is doing excellent with regard to her foot ulcer. The patient is very pleased this is doing well. Electronic Signature(s) Signed: 07/22/2019 10:36:32 AM By: Worthy Keeler PA-C Entered By: Worthy Keeler on 07/21/2019 13:10:21 -------------------------------------------------------------------------------- Physical Exam Details Patient Name: Date of Service: [name], [name] 07/21/2019 12:30 PM Medical Record [id number] Patient Account Number: [id number] Date of Birth/Sex: Treating RN: 06/21/1946 (73 y.o. F) Primary Care Provider: Chrisandra Netters Other Clinician: Referring Provider: Treating Provider/Extender:Stone III, Anabel Bene, Ivan Anchors in Treatment: 0 Constitutional Well-nourished and well-hydrated in no acute distress. Respiratory normal breathing without difficulty. Psychiatric this patient is able to make decisions and demonstrates good insight into disease process. Alert and Oriented x 3. pleasant and cooperative. Notes Upon inspection today patient's wound bed actually showed signs of good granulation at this time. Fortunately there is no signs of active infection currently. No fevers, chills, nausea, vomiting, or diarrhea. Electronic Signature(s) Signed: 07/22/2019 10:36:32 AM By: Worthy Keeler PA-C Entered By: Worthy Keeler on 07/21/2019 13:10:50 -------------------------------------------------------------------------------- Physician Orders Details Patient Name: Date of Service: DAIANA, VITIELLO 07/21/2019 12:30 PM Medical Record [id number] Patient Account Number: [id number] Date of Birth/Sex: Treating RN: 1946/07/02 (73 y.o. [name] Primary Care Provider: Chrisandra Netters Other Clinician: Referring Provider: Treating Provider/Extender:Stone III, Anabel Bene, Ivan Anchors in  Treatment: 0 Verbal / Phone Orders: No Diagnosis Coding ICD-10 Coding Code Description E11.42 Type 2 diabetes mellitus with diabetic polyneuropathy L97.518 Non-pressure chronic ulcer of other part of right foot with other specified severity L97.521 Non-pressure chronic ulcer of other part of left foot limited to breakdown of skin L02.612 Cutaneous abscess of left foot S80.812D Abrasion, left lower leg, subsequent encounter Discharge From [facility] Services Discharge from [facility] - both feet daily Off-Loading Open toe surgical shoe to: - left foot continue to wear until get new diabetic shoes Other: - foam callous pad bottom of left foot daily [facility] - [facility] - discontinue wound care Electronic Signature(s) Signed: 07/21/2019 6:22:02 PM By: Baruch Gouty RN, BSN Signed: 07/22/2019 10:36:32 AM By: Worthy Keeler PA-C Entered By: Baruch Gouty on 07/21/2019 13:09:48 -------------------------------------------------------------------------------- Problem List Details Patient Name: Date of Service: [name]. 07/21/2019 12:30 PM Medical Record [id number] Patient Account Number: [id number] Date of Birth/Sex: Treating RN: Nov 03, 1945 (73 y.o. F) Primary Care Provider: Chrisandra Netters Other Clinician: Referring Provider: Treating Provider/Extender:Stone III, Anabel Bene, Ivan Anchors in  Treatment: 0 Active Problems ICD-10 Evaluated Encounter Code Description Active Date Today Diagnosis E11.42 Type 2 diabetes mellitus with diabetic polyneuropathy 07/03/2018 No Yes L97.518 Non-pressure chronic ulcer of other part of right foot 07/20/2018 No Yes with other specified severity L97.521 Non-pressure chronic ulcer of other part of left foot 08/15/2018 No Yes limited to breakdown of skin L02.612 Cutaneous abscess of left foot 07/20/2018 No Yes S80.812D Abrasion, left lower leg, subsequent  encounter 05/17/2019 No Yes Inactive Problems ICD-10 Code Description Active Date Inactive Date L97.511 Non-pressure chronic ulcer of other part of right foot limited to 07/03/2018 07/03/2018 breakdown of skin E11.52 Type 2 diabetes mellitus with diabetic peripheral angiopathy 07/20/2018 07/20/2018 with gangrene Resolved Problems ICD-10 Code Description Active Date Resolved Date S81.811D Laceration without foreign body, right lower leg, subsequent 07/03/2018 07/03/2018 encounter L97.821 Non-pressure chronic ulcer of other part of left lower leg limited 07/03/2018 07/03/2018 to breakdown of skin L97.211 Non-pressure chronic ulcer of right calf limited to breakdown of 01/12/2019 01/12/2019 skin Electronic Signature(s) Signed: 07/22/2019 10:36:32 AM By: Worthy Keeler PA-C Entered By: Worthy Keeler on 07/21/2019 13:04:05 -------------------------------------------------------------------------------- Progress Note Details Patient Name: Date of Service: [name]. 07/21/2019 12:30 PM Medical Record [id number] Patient Account Number: [id number] Date of Birth/Sex: Treating RN: 1945-11-18 (74 y.o. F) Primary Care Provider: Chrisandra Netters Other Clinician: Referring Provider: Treating Provider/Extender:Stone III, Anabel Bene, Ivan Anchors in Treatment: 0 Subjective Chief Complaint Information obtained from Patient Patients presents for treatment of an open diabetic ulcer to the left foot which she's had for about 2 weeks now 09/19/17; patient is here for review of blistered areas on the right leg o2 and on the left leg o1 which have been present for the last week 07/03/2018; patient comes back to clinic for review of wounds on her bilateral lower extremities History of Present Illness (HPI) 73 year old patient who is known to our practice from May of last year was fully worked up with a venous and arterial duplex study and was referred to the vascular surgeon for  follow-up. The patient says she has seen the vascular surgeons and has an appointment back in April. No procedure was done. She has developed a blister on the left big toe and forefoot which has been fairly large and draining fluid and she self-referred herself to [place]. The patient was recently seen in the ER on 10/06/2016, and was treated with clindamycin and asked to see the podiatrist and PCP. She saw the podiatrist Dr. Felisa Bonier on 10/11/2016 who I understand did an x-ray but no report is available. Of note last year when I saw her, I had referred her to do Dr. Ruta Hinds -- he saw on 02/04/2016. he reviewed and noted that she had evidence of peripheral arterial disease and recommended a follow-up in 6 months for repeat noninvasive arterial exam. Wounds worsened he would consider an angiogram. He saw her back on 03/14/2016, and continued conservative treatment and asked her to come back for noninvasive studies in 6 months. 12/03/2016 -- was seen in the office on 11/28/2016 and note is made of the fact that venous duplex examination on 01/02/2016 showed no evidence of reflux. Did have evidence of peripheral arterial disease and was asked to follow- up in 6 months time. Recent data on 11/28/2016 showed right noncompressible with monophasic waveforms left noncompressible with monophasic waveforms. The thought was she has medial calcifications of the arteries and had bilateral toe brachial indices which were normal. Right toe brachial index was 0.74 and the left  toe brachial index was 0.80 She would return for a vascular study in 6 months when her ABI would be checked again. 7/6-Patient is back at 1 week visit to the clinic, home health is attending to the wound once a week, we have been using silver alginate, MRI scheduled this week on Thursday for the left foot. We are seeing her for the left foot plantar ulcer and the right great toe ulcer 12/24/2016 -- the left plantar heel had a large callus  which came off during her evaluation and there was an open ulcer at the base ==== Old notes She was recently seen by her PCP for an ulcer of the left toe which was treated at the urgent care by giving her ciprofloxacin which she has been taking. She has uncontrolled diabetes and A1c was 13.7 done last week. Past medical history significant for poorly controlled diabetes mellitus type 2, obesity, osteopenia, GERD, diabetic retinopathy, diabetic neuropathy, reported arthritis, hypothyroidism, hypertension. No x-rays were done recently. 12/27/2015 -- had an x-ray of the left foot --IMPRESSION:Juxta-articular erosions at the IP joint great toe and diffusely in the MTP joints greatest at first, suggesting an inflammatory arthropathy such as rheumatoid arthritis or gout.While septic arthritis can cause juxta-articular erosions, the multiplicity of joints involved makes this less likely. Significant soft tissue swelling with single tiny questionable focus of soft tissue gas medial to the first metatarsal head. If patient has persistent symptoms or persistent clinical concern for osteomyelitis, recommend MR imaging of the LEFT foot (with and without contrast if renal function permits). 01/03/2016 -- the lower extremity venous duplex reflex evaluation showed no evidence of deep or superficial thrombus or reflux. The arterial study done and showed the resting right ABI is greater than 1.3 and the left ABI was greater than 1.3 indicating arterial medial calcification. The right TBI was less than 0.64 and the left ABI was less than 0.64 both of which are abnormal. except for the left posterior tibial which is triphasic on the flow is biphasic. With the above results due to the fact that she has diabetes mellitus and would recommend she sees the vascular surgeon to see if any further intervention or procedure needs to be planned. ====== READMISSION 09/19/17;this is a 73 year old woman that we've had in  our clinic previously for wounds on predominantly her left foot left toe and left heel. She is a diabetic. She has had lower extremity workups for both venous reflux disease and arterial disease. She has known noncompressible vessels bilaterally however in May 2017 as noted above her TBI was 0.64 and the left TBI was less than 0.64. When she was last in clinicin May 2017 the left heel wound had closed. She was faithfully wearing her juxta light stockings. She tells me 2-3 weeks ago she developed increasing edema in her legs. She was seen by Dr. Valentino Saxon of nephrology who apparently increased her Lasix from 40-80 mg twice a day. The patient has not noticed any improvement in the edema. She does not weigh herself. About a week ago she noted blisters on her legs on the right o2 on the left o1 since then she has not been wearing the juxta light stockings. She has not had any pain no shortness of breath 09/26/17;; the patient has a stable wound as a result of blistering on the left anterior calf and 2 on the right anterior and right medial. There is another denuded blister on the left anterior more superior. This does not have any current  fluid I did not remove the skin today. We've been using silver alginate under compression 10/03/17; the patient's right leg is healed. The area on the left anterior leg all wound sites look better. We've been using silver alginate under compression. In questioning the patient apparently has a juxta light stockings however she has not been using these. We asked her to find them lubricate her skin every night and used a juxta light stocking on the right. I would like to see this next time 10/10/17; both the patient's areas on the left anterior leg o2 are closed the area on the look right posterior medial calf remains closed. She is been using a juxta light stocking on the right she has one for the left Readmission Mrs. Joya Gaskins is now a 73 year old woman we have had in  this clinic at least 3 times before. Most recently she was here in February 2019 for 2 visits with wounds on her bilateral lower extremities because of blisters these healed fairly quickly and we discharged her in juxta lite stockings. She was previously here in 2018 with a wound on her left foot and in 2017 she was sent to see Dr. Oneida Alar for follow-up of PAD. Indeed she seems to have had follow-up noninvasive vascular studies every 6 months. Her current problems began at the end of October she apparently suffered a fall with a laceration on her right anterior leg. She had this sutured on 06/23/2018. Sometime in the same timeframe she developed blistering over a large area of the left anterior calf, the right great toe. The blisters have spontaneously ruptured and she has large areas of the epithelial loss. She also has non-open blisters on the medial part of the left great toe. There is also an open area on the right second toe. I do not believe that she was using her compression stockings [not juxta lites] because the swelling in her legs had gotten too large for her to wrap. She tells me she is recently been to her nephrologist and had her Lasix increased which is helped somewhat with the swelling. The patient did not have arterial studies done in our clinic. Her most recent noninvasive studies were in August. These showed ABIs noncompressible bilaterally. She had TBI's on the right at 0.54 and on the left at 0.60 biphasic waveforms at the PTA and DP bilaterally. She was felt to have bilateral ABIs that were unchanged from her previous study in October 2018. However it was noted her TBI's were decreased. Past medical history includes type 2 diabetes with peripheral neuropathy with a recent hemoglobin A1c of 7. She is apparently off her diabetes medications, she has stage III chronic renal failure, hyperlipidemia, hypertension, cataracts, retinopathy, rheumatoid arthritis, iron deficiency and  apparently is receiving IV iron, urge incontinence 07/10/2018; she arrives today with everything looking quite a bit better. The large area of blistered denuded epithelium on the left calf has healed over. The area on the right lateral tibial area is also mostly healed still with a linear open area where her laceration was. The area on her left medial toe is just about healed. She has a dark thick black eschar over the tip of her right first toe. I do not remember this from last week this feels almost like the eschar you would see with ischemia yet her toes are warm and her peripheral pulses are palpable. She does have some degree of PAD but I do not think that was sufficient enough to have caused this.  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 Patient originally came here with a laceration injury of her right lateral mid calf. She also had an area of denuded skin on the left which is since closed over. She had a small area on the tip of her right great toe as well. She arrives today with what looks to be dry gangrene on the right great toe as well as discoloration of the tips of her second and third toes. She is not in any pain. She also had purulent drainage coming out open area on her left midfoot which was new this week. Culture was obtained The patient is known to vascular surgery having last been seen on 04/02/2018. They follow her for chronic arterial insufficiency. Last arterial studies were in August 2017 at which time her ABIs were noncompressible however her TBI was 0.54 on the right and 0.60 on the left. She was noted to have bilateral ankle arteries remain calcified. Waveforms biphasic. Slight decline in bilateral TBI's 07/24/2018; the patient was graciously seen urgently by vein and vascular with regards to critical limb ischemia, she had a CO2 angiogram and then contrast to look at the lower extremity arteries. Surprisingly no significant  vascular disease was noted in the aortic iliac set segment, patent common femoral and profunda arteries patent popliteal and three-vessel runoff with all 3 of the tibials present. Her dorsalis pedis and plantar arch vessels also filled. Notable for the fact that it was felt that she had small vessel disease near her toes but this was not amenable for intervention. The abscess that I unroofed last week on the plantar left foot grew Stenotrophomonas Maltophilia. This is not a usual true wound pathogen. However it had abundant organisms and given the fact that is an abscess I went ahead and treated this with Levaquin 250 daily for 7 days. I had her on doxycycline last week 07/31/2018; I put 3 layer compression on the right leg last week. She arrives in today with almost no edema in the right leg and out of concern for the this in the gangrenous toes I am going to reduce this back to Kerlix and Coban which is what we had her on at the beginning. We are using silver alginate to the traumatic wound on her right anterior leg and this looks a lot better. The area on the left foot is healed and the patient is using her own wraparound compression stocking here. She still has the dry gangrene-like changes predominantly involving the right first toe, tip of her right second and third toes. We are using Betadine here. The exact etiology of this was not completely clear 08/07/18; we've been using 2 layer compression on the right leg. She still has the gangrenous toes for second and third on the right and the original laceration injury. She does not have any significant vascular issues to the level of the ankle. She does have small vessel disease in her toes.The left mid foot wound is closed 08/14/18; the patient arrives in clinic today with improvement in the original area on her right lateral leg. The 3 dry gangrenous toes first second and third on the right all look stable except for the second. This was  obviously separating. It was removed fortunately there is healthy-looking surface tissue underneath this. ooShe arrives in clinic with a reopening on the left midfoot. This was previously a small abscess that had closed. She has Charcot feet bilaterally 08/24/2018; right lateral  leg wound is smaller. The 3 dry gangrenous first second and third toes are a bit changed. The third toe was separating I remove the eschar and subcutaneous tissue to reveal a small open area at the tip of the toe just at the base at the head of the nail. The first toe is unchanged. ooLeft plantar foot is larger. This is in the Charcot foot area. I removed nonviable circumference and surface from the wound using silver alginate 08/31/2018; right lateral leg wound continues to contract also the area on her plantar foot. First great toe is beginning to separate and the nail will soon I think fall off.. She sees vein and vascular tomorrow. 1/20; right lateral leg wound continues to contract and is almost closed first great toe is beginning to separate. The area on her left plantar foot still has rolled edges around the wound and some debris in the surface but generally smaller. We have been using silver alginate to the 2 remaining wound areas and Betadine to the left great toe. The patient saw vein and vascular on 1/14. They did not add anything here. Noted that the patient has microvascular disease but no major macrovascular issues. No intervention was performed during her CO2 aortogram on 07/23/2018. 2/3; 2-week follow-up. Right leg wound which is her original wound and coming here is healed. Second and third right toes have healed. She still has dry eschared/dry gangrene over the right first toe. I thought this might begin to separate but it really is not making great headway with that. The area on the left midfoot reopened 2 to 3 weeks ago and this is made no progress. Finally she has new blisters on the left first toe  laterally and the left second toe dorsally over the DIP at the base of the nail 2/17; 2-week follow-up. Right leg wound which is her original wound is healed. Right second and third toes are still healed. She still has the black ischemic eschar over the first toe which has not really begun to separate. The area on the left midfoot is unfortunately worse with large amounts of denuded skin around this. She has blisters on the medial part of her first toe and the dorsal part of the second toe on the left 2/24; she still has the black ischemic eschar over the first toe which is not really separating. The area on the left midfoot seems stable from last week. The blisters on the medial part of her first toe and dorsal second toe had hardened and I removed both of these there are small open areas underneath. 3/2; left first and second toes are healed. The area on the left plantar foot looks better. Finally the right great toe area is beginning to separate. 3/9; left first and second toes remain healed. Left plantar foot continues to look excellent smaller with a healthy base. Finally the right great toe had separated further. I went ahead and remove the eschar here. There is still a fairly sizable wound here but this will give [place] a better chance to address this. 3/16-Left first and second toes mostly healed although the left first toe appears to have an open area Right great toe deep ulcer with exposed bone, greenish debris with necrotic tissue, right second toe wound with clean edges and base. Patient had silver alginate dressing to all the toes especially on the right. SHe has been offloading with inserts and open toe shoes 3/23; since I last saw this patient 2 weeks ago there  is been quite a bit of deterioration. The left plantar foot continues to look about the same. She now has an area on the left lateral first toe. On the right foot the first toe has a fair amount of exposed bone. X-ray that we  did last week did not show osteomyelitis nevertheless that would have to be a concern. She also has an area on the tip of the right second toe that is new 3/30; some improvement in the left plantar foot. Bone I took out of the right first toe last week did not show osteomyelitis under pathology however culture did grow rare staph aureus and rare Citrobacter. We have been using silver alginate to all her wounds 4/6; left plantar foot wound about the same although this does not look ominous. The right first toe which is been very problematic does not have exposed bone aggressive debridement of the surface of this. She does have grams of epithelialization. We have been using silver alginate on both wounds 4/13; left plantar foot wound about the same. Removed skin and subcutaneous tissue from the wound circumference. ooThe right great toe has underlying osteomyelitis I have extended her Levaquin 500 mg for another 2 weeks. She does not complain of any overt side effects. 4/23;. The patient has wounds on the left plantar foot, left lateral great toe and a new area on the dorsal part of the right second toe. Her large area over the tip of her right great toe. She is on Levaquin I will need to check on when we want to finish this. This is for underlying osteomyelitis of the right great toe 5/4; the patient has or should be completing her Levaquin at this point. This is for osteomyelitis in the right great toe. She also has wounds on her left plantar foot and the dorsal part of her right second toe. We have been using silver alginate 5/11; patient arrives today with her right great toe generally looking better. However the area on the plantar left foot had considerable undermining and discomfort around the wound and some erythema. We have been using silver alginate 5/18-Patient returns at 1 week the right great toe is much more macerated around the edges of the wound, the left foot wounds are  considerably better especially the left great toe and left second toe. We have been using silver alginate to all wounds and juxta lite to the right leg and 2 layer compression to the left. THere is now a fluid filled bleb on the posterior calf of RLE 5/26; right great toe continues to improve. The underlying osteomyelitis was treated empirically in this clinic. ooLeft midfoot wound also looks better than the last time I saw this 2 weeks ago. ooApparently last week she had a blister on the right posterior calf that was largely. This was not specifically addressed. It is open since last time she has a large area on the right posterior mid calf with skin attached to a large amount of the circumference. 6/1; we continue to have true improvements in both of the wound areas on the right great toe and left plantar foot. The large blister on the posterior right calf is just about fully epithelialized although there is still some weeping edema in this area 6/8; we continued to have been improvements in both areas currently on the right great toe and left plantar foot. The large blister on the posterior right calf is fully healed. 6/15; we we continue to have improvements in the right  great toe. The left plantar foot which was a small wound without undermining last week has decided to expand into the lateral foot. There is some swelling in this area. She is not systemically unwell 6/22; the culture of her left foot that I did last week grew Enterobacter I had her on doxycycline. There was also clearly some purulent drainage coming from this today through the original wound with the swelling on the lateral part of her foot. She is not systemically unwell. 6/25 the patient is on Levaquin for the Enterobacter cultured out of her left foot. She now has an extensive wound area in the medial left foot we are using silver alginate The patient complains of pain in the foot going up towards the metatarsal head.  There is also swelling inferiorly to this area that we identified last week but this is not tender 6/29; the patient is completing her Levaquin for the Enterobacter. We are using silver alginate to the extensive wound on the left plantar foot as well as to the right great toe 7/14; the right great toe is healed today. The area on the left foot looks better. The MRI of the left foot did not show osteomyelitis in the area of the wound however it did show an effusion in the tibiotalar joint. The patient has rheumatoid arthritis 7/20; right great toe remains healed. The area on the left foot continues to look improved. 7/27 right great toe remains healed. The area on the left foot continues to contract. Healthy granulation tissue she is only offloading this in surgical shoes. 03/24/2019 on evaluation today patient appears to be doing very well with regard to her plantar foot ulcer. She has been tolerating the dressing changes without complication. She has a healthy granulation surface. There is no signs of active infection at this time and her swelling seems to be under good control. 8/17; the areas on the left medial midfoot. Probably subluxed bone in this area. Wounds are superficial. It looks as though there was some blistering skin at one point 8/24; the area on the left medial midfoot. This is completely resolved at this point although it still looks somewhat vulnerable. She probably has subluxed bone underneath this area. She has pes planus deformity 9/14; apparently this wound reopened in the 3 weeks since I saw her. She was seen last week she is given new healing sandals. Been using calcium alginate with felt offloading. This is not surprising given the area looks somewhat vulnerable. 9/28; left plantar foot in the setting of diabetic neuropathy probably some degree of Charcot deformity. She also has abrasions on the left leg which she says are due to the Curlex co-band that was put on by home  health. She is also having a lot of pain in the left calf finds the wrap too tight 10/5; left plantar foot has 2 superficial open areas. The abrasions on the left leg from last week are healed. She still has some swelling and tenderness however. Duplex ultrasound of the leg tomorrow tomorrow. 10/13; patient arrived with a deterioration in the left foot from last week. Undermining wound probably connecting the 2 superficial areas from last week. She wears a surgical shoe with felt offloading. Duplex ultrasound she had last week showed a ruptured Baker's cyst. Her leg looks a lot better 10/20; 2 remanent open areas in the left foot. This looks better than last time. We are using silver alginate. She wears a surgical shoe for offloading there is not a more  aggressive option 10/26; the patient does not have any open on the left foot we are using silver alginate to the medial aspect of the foot. She wears a surgical shoe on both feet. Juxta lite stocking on the right, she has 1 for the left. This is been a long haul for this patient. Initially coming in with a wound on her right lateral calf. Then her right great toe. More recently areas on the left medial foot 06/30/2019 on evaluation today this is a patient that I regularly do not see but unfortunately she has a wound on the plantar aspect of her foot which I have seen before that appeared to likely be healed last week although it reopened before she even got to the end of the week. This is mainly on the plantar aspect of her foot. She is really not been able to find any sandals that Dr. Dellia Nims was talking to her about as far as going forward what she should be wearing. Fortunately there is no evidence of active infection at this time which is good news. No fevers, chills, nausea, vomiting, or diarrhea. 07/07/2019 on evaluation today patient appears to be doing very well in regard to her foot ulcer from the standpoint of the original wound opening.  Unfortunately the biggest issue I see right now she does seem to have a sinus tract along with a blister off the medial portion of her foot which is going to be giving her trouble at this point. We will continue to clean this away in order to allow this to heal appropriately. It does appear that this may actually be open underneath the blister. 07/21/2019 on evaluation today patient actually appears to be completely healed and is doing excellent with regard to her foot ulcer. The patient is very pleased this is doing well. Patient History Information obtained from Patient. Family History Cancer - Mother,Siblings, Diabetes - Mother, Heart Disease - Mother, Hypertension - Mother, Lung Disease - Father, No family history of Hereditary Spherocytosis, Kidney Disease, Seizures, Stroke, Thyroid Problems, Tuberculosis. Social History Never smoker, Marital Status - Widowed, Alcohol Use - Never, Drug Use - No History, Caffeine Use - Rarely. Medical History Eyes Patient has history of Cataracts - removed on left eye Denies history of Glaucoma, Optic Neuritis Ear/Nose/Mouth/Throat Denies history of Chronic sinus problems/congestion, Middle ear problems Hematologic/Lymphatic Denies history of Anemia, Hemophilia, Human Immunodeficiency Virus, Lymphedema, Sickle Cell Disease Respiratory Denies history of Aspiration, Asthma, Chronic Obstructive Pulmonary Disease (COPD), Pneumothorax, Sleep Apnea, Tuberculosis Cardiovascular Patient has history of Hypertension - HCTZ, Denies history of Angina, Arrhythmia, Congestive Heart Failure, Coronary Artery Disease, Deep Vein Thrombosis, Hypotension, Myocardial Infarction, Peripheral Arterial Disease, Peripheral Venous Disease, Phlebitis, Vasculitis Gastrointestinal Denies history of Cirrhosis , Colitis, Crohnoos, Hepatitis A, Hepatitis B, Hepatitis C Endocrine Patient has history of Type II Diabetes - Since age 74 Denies history of Type I  Diabetes Genitourinary Denies history of End Stage Renal Disease Immunological Denies history of Lupus Erythematosus, Raynaudoos, Scleroderma Integumentary (Skin) Denies history of History of Burn Musculoskeletal Patient has history of Rheumatoid Arthritis - 20 years Denies history of Gout, Osteoarthritis, Osteomyelitis Neurologic Denies history of Dementia, Neuropathy, Quadriplegia, Paraplegia, Seizure Disorder Oncologic Denies history of Received Chemotherapy, Received Radiation Psychiatric Denies history of Anorexia/bulimia, Confinement Anxiety Medical And Surgical History Notes Endocrine hypothyroid Psychiatric Schizotypal personality disorder Review of Systems (ROS) Constitutional Symptoms (General Health) Denies complaints or symptoms of Fatigue, Fever, Chills, Marked Weight Change. Respiratory Denies complaints or symptoms of Chronic or frequent coughs,  Shortness of Breath. Cardiovascular Denies complaints or symptoms of Chest pain. Psychiatric Denies complaints or symptoms of Claustrophobia, Suicidal. Objective Constitutional Well-nourished and well-hydrated in no acute distress. Vitals Time Taken: 12:45 PM, Height: 62 in, Weight: 140 lbs, BMI: 25.6, Temperature: 97.9 F, Pulse: 73 bpm, Respiratory Rate: 16 breaths/min, Blood Pressure: 163/79 mmHg. Respiratory normal breathing without difficulty. Psychiatric this patient is able to make decisions and demonstrates good insight into disease process. Alert and Oriented x 3. pleasant and cooperative. General Notes: Upon inspection today patient's wound bed actually showed signs of good granulation at this time. Fortunately there is no signs of active infection currently. No fevers, chills, nausea, vomiting, or diarrhea. Integumentary (Hair, Skin) Wound #19 status is Open. Original cause of wound was Gradually Appeared. The wound is located on the [place]. The wound measures 0cm length x 0cm width x 0cm  depth; 0cm^2 area and 0cm^3 volume. There is no tunneling or undermining noted. There is a none present amount of drainage noted. The wound margin is flat and intact. There is no granulation within the wound bed. There is no necrotic tissue within the wound bed. Assessment Active Problems ICD-10 Type 2 diabetes mellitus with diabetic polyneuropathy Non-pressure chronic ulcer of other part of right foot with other specified severity Non-pressure chronic ulcer of other part of left foot limited to breakdown of skin Cutaneous abscess of left foot Abrasion, left lower leg, subsequent encounter Plan Discharge From [facility]: Discharge from [place] Skin Barriers/Peri-Wound Care: Moisturizing lotion - both feet daily Off-Loading: Open toe surgical shoe to: - left foot continue to wear until get new diabetic shoes Other: - foam callous pad bottom of left foot daily Home Health: [facility] - discontinue wound care 1. Overall patient seems to be doing extremely well she is healed at this point. The question is good to be how to keep this healed. We did discuss today continue to pad the area and using her postop shoes. 2. She is supposed to be getting new diabetic shoes this month. For that reason I recommend she continue with the postop shoes until she gets the new diabetic shoes for her feet. She is in agreement with that plan. Patient will follow-up as needed. Electronic Signature(s) Signed: 07/22/2019 10:36:32 AM By: Worthy Keeler PA-C Entered By: Worthy Keeler on 07/21/2019 13:11:35 -------------------------------------------------------------------------------- HxROS Details Patient Name: Date of Service: [name]. 07/21/2019 12:30 PM Medical Record [id number] Patient Account Number: [id number] Date of Birth/Sex: Treating RN: 1946/03/28 (73 y.o. F) Primary Care Provider: Chrisandra Netters Other Clinician: Referring Provider:  Treating Provider/Extender:Stone III, Anabel Bene, Ivan Anchors in Treatment: 0 Information Obtained From Patient Constitutional Symptoms (General Health) Complaints and Symptoms: Negative for: Fatigue; Fever; Chills; Marked Weight Change Respiratory Complaints and Symptoms: Negative for: Chronic or frequent coughs; Shortness of Breath Medical History: Negative for: Aspiration; Asthma; Chronic Obstructive Pulmonary Disease (COPD); Pneumothorax; Sleep Apnea; Tuberculosis Cardiovascular Complaints and Symptoms: Negative for: Chest pain Medical History: Positive for: Hypertension - HCTZ, Negative for: Angina; Arrhythmia; Congestive Heart Failure; Coronary Artery Disease; Deep Vein Thrombosis; Hypotension; Myocardial Infarction; Peripheral Arterial Disease; Peripheral Venous Disease; Phlebitis; Vasculitis Psychiatric Complaints and Symptoms: Negative for: Claustrophobia; Suicidal Medical History: Negative for: Anorexia/bulimia; Confinement Anxiety Past Medical History Notes: Schizotypal personality disorder Eyes Medical History: Positive for: Cataracts - removed on left eye Negative for: Glaucoma; Optic Neuritis Ear/Nose/Mouth/Throat Medical History: Negative for: Chronic sinus problems/congestion; Middle ear problems Hematologic/Lymphatic Medical History: Negative for: Anemia; Hemophilia; Human Immunodeficiency  Virus; Lymphedema; Sickle Cell Disease Gastrointestinal Medical History: Negative for: Cirrhosis ; Colitis; Crohns; Hepatitis A; Hepatitis B; Hepatitis C Endocrine Medical History: Positive for: Type II Diabetes - Since age 39 Negative for: Type I Diabetes Past Medical History Notes: hypothyroid Time with diabetes: 28 Treated with: Diet Blood sugar tested every day: No Genitourinary Medical History: Negative for: End Stage Renal Disease Immunological Medical History: Negative for: Lupus Erythematosus; Raynauds; Scleroderma Integumentary (Skin) Medical  History: Negative for: History of Burn Musculoskeletal Medical History: Positive for: Rheumatoid Arthritis - 20 years Negative for: Gout; Osteoarthritis; Osteomyelitis Neurologic Medical History: Negative for: Dementia; Neuropathy; Quadriplegia; Paraplegia; Seizure Disorder Oncologic Medical History: Negative for: Received Chemotherapy; Received Radiation HBO Extended History Items Eyes: Cataracts Immunizations Pneumococcal Vaccine: Received Pneumococcal Vaccination: Yes Tetanus Vaccine: Last tetanus shot: 10/22/2004 Immunization Notes: doesn't remember when last tetanus Implantable Devices No devices added Family and Social History Cancer: Yes - Mother,Siblings; Diabetes: Yes - Mother; Heart Disease: Yes - Mother; Hereditary Spherocytosis: No; Hypertension: Yes - Mother; Kidney Disease: No; Lung Disease: Yes - Father; Seizures: No; Stroke: No; Thyroid Problems: No; Tuberculosis: No; Never smoker; Marital Status - Widowed; Alcohol Use: Never; Drug Use: No History; Caffeine Use: Rarely; Financial Concerns: Yes; Food, Clothing or Shelter Needs: No; Support System Lacking: No; Transportation Concerns: No Physician Affirmation I have reviewed and agree with the above information. Electronic Signature(s) Signed: 07/22/2019 10:36:32 AM By: Worthy Keeler PA-C Entered By: Worthy Keeler on 07/21/2019 13:10:36 -------------------------------------------------------------------------------- SuperBill Details Patient Name: Date of Service: [name] 07/21/2019 Medical Record [id number] Patient Account Number: [id number] Date of Birth/Sex: Treating RN: 12-31-1945 (73 y.o. F) Primary Care Provider: Chrisandra Netters Other Clinician: Referring Provider: Treating Provider/Extender:Stone III, Anabel Bene, Ivan Anchors in Treatment: 0 Diagnosis Coding ICD-10 Codes Code Description E11.42 Type 2 diabetes mellitus with diabetic polyneuropathy L97.518 Non-pressure  chronic ulcer of other part of right foot with other specified severity L97.521 Non-pressure chronic ulcer of other part of left foot limited to breakdown of skin L02.612 Cutaneous abscess of left foot S80.812D Abrasion, left lower leg, subsequent encounter Facility Procedures Physician Procedures CPT4 Code Description: [id number] - WC PHYS LEVEL 3 - EST PT ICD-10 Diagnosis Description E11.42 Type 2 diabetes mellitus with diabetic polyneuropathy L97.518 Non-pressure chronic ulcer of other part of right foot w L97.521 Non-pressure chronic ulcer  of other part of left foot li L02.612 Cutaneous abscess of left foot Modifier: ith other specifi mited to breakdow Quantity: 1 ed severity n of skin Electronic Signature(s) Signed: 07/21/2019 6:22:02 PM By: Baruch Gouty RN, BSN Signed: 07/22/2019 10:36:32 AM By: Worthy Keeler PA-C Entered By: Baruch Gouty on 07/21/2019 13:15:52

## 2019-07-22 NOTE — Telephone Encounter (Signed)
Pt returned call.  Christen Bame, CMA

## 2019-07-23 ENCOUNTER — Ambulatory Visit: Payer: Medicare Other | Admitting: Podiatry

## 2019-07-23 ENCOUNTER — Ambulatory Visit (HOSPITAL_COMMUNITY)
Admission: RE | Admit: 2019-07-23 | Discharge: 2019-07-23 | Disposition: A | Discharge status: Home / Self Care | Payer: Medicare Other | Source: Ambulatory Visit | Attending: Nephrology | Admitting: Nephrology

## 2019-07-23 ENCOUNTER — Other Ambulatory Visit: Payer: Self-pay

## 2019-07-23 VITALS — BP 132/78 | HR 65 | Temp 97.0°F | Resp 18

## 2019-07-23 DIAGNOSIS — D508 Other iron deficiency anemias: Secondary | ICD-10-CM

## 2019-07-23 DIAGNOSIS — I10 Essential (primary) hypertension: Secondary | ICD-10-CM | POA: Diagnosis not present

## 2019-07-23 DIAGNOSIS — L97421 Non-pressure chronic ulcer of left heel and midfoot limited to breakdown of skin: Secondary | ICD-10-CM | POA: Diagnosis not present

## 2019-07-23 DIAGNOSIS — D631 Anemia in chronic kidney disease: Secondary | ICD-10-CM | POA: Diagnosis not present

## 2019-07-23 DIAGNOSIS — N189 Chronic kidney disease, unspecified: Secondary | ICD-10-CM | POA: Insufficient documentation

## 2019-07-23 DIAGNOSIS — E11621 Type 2 diabetes mellitus with foot ulcer: Secondary | ICD-10-CM | POA: Diagnosis not present

## 2019-07-23 DIAGNOSIS — E039 Hypothyroidism, unspecified: Secondary | ICD-10-CM | POA: Diagnosis not present

## 2019-07-23 DIAGNOSIS — E1142 Type 2 diabetes mellitus with diabetic polyneuropathy: Secondary | ICD-10-CM | POA: Diagnosis not present

## 2019-07-23 LAB — POCT HEMOGLOBIN-HEMACUE: Hemoglobin: 11.4 g/dL — ABNORMAL LOW (ref 12.0–15.0)

## 2019-07-23 MED ORDER — EPOETIN ALFA-EPBX 40000 UNIT/ML IJ SOLN
40000.0000 [IU] | INTRAMUSCULAR | Status: DC
  Administered 2019-07-23: 40000 [IU] via SUBCUTANEOUS
  Filled 2019-07-23: qty 1

## 2019-07-23 MED ORDER — LEVOTHYROXINE SODIUM 150 MCG PO TABS: 150.0000 ug | ORAL_TABLET | Freq: Every day | ORAL | 0 refills | Status: DC

## 2019-07-23 NOTE — Progress Notes (Unsigned)
Called patient & reached her. She confirms she is taking levothyroxine 155mcg on an empty stomach each morning TSH is elevated. Will increase to 13mcg and recheck TSH in 4 weeks. Lab visit scheduled. Patient appreciative. [name], MD

## 2019-07-23 NOTE — Telephone Encounter (Signed)
Called patient & reached her. She confirms she is taking levothyroxine 158mcg on an empty stomach each morning TSH is elevated. Will increase to 154mcg and recheck TSH in 4 weeks. Lab visit scheduled. Patient appreciative. [name], MD

## 2019-07-23 NOTE — Progress Notes (Signed)
Virtual Visit via Telephone Note  I connected with [name] on 07/26/19 at  8:15 AM EST by telephone and verified that I am speaking with the correct person using two identifiers.  Location: Patient: Home  Provider: Clinic  This service was conducted via virtual visit.  Both audio and visual tools were used.  The patient was located at home. I was located in my office.  Consent was obtained prior to the virtual visit and is aware of possible charges through their insurance for this visit.  The patient is an established patient.  Dr. , MD conducted the virtual visit and Taylor Dale, PA-C acted as scribe during the service.  Office staff helped with scheduling follow up visits after the service was conducted.    I discussed the limitations, risks, security and privacy concerns of performing an evaluation and management service by telephone and the availability of in person appointments. I also discussed with the patient that there may be a patient responsible charge related to this service. The patient expressed understanding and agreed to proceed.  CC: Pain in multiple joints  History of Present Illness: Patient is a 73 year old female with a past medical history of rheumatoid arthritis.  Patient started she started having arthritis when she was in her 20s.  She initially thought she had osteoarthritis.  In her 50s she saw Dr. Truslow who told her that she had overlap of osteoarthritis and rheumatoid arthritis.  She was treated with methotrexate for couple of years and then the medication was discontinued and he retired.  She also was on low-dose prednisone.  She states she has not been on any medications since 2013.  She states she continues to have pain and discomfort in her cervical spine, hands her knees and her feet.  She states she has noticed deformities in her hands.  She has been having recurrent problems with foot ulcer for many years due to diabetes.  She is also gives history  of gangrene in her foot.  She states she is seeing a nephrologist for chronic renal insufficiency.    Review of Systems  Constitutional: Positive for malaise/fatigue. Negative for fever.  Eyes: Negative for photophobia, pain, discharge and redness.       +Dry eyes  Respiratory: Negative for cough, shortness of breath and wheezing.   Cardiovascular: Negative for chest pain and palpitations.  Gastrointestinal: Negative for blood in stool, constipation and diarrhea.  Genitourinary: Negative for dysuria.  Musculoskeletal: Positive for joint pain. Negative for back pain, myalgias and neck pain.       +Morning stiffness   Skin: Negative for rash.  Neurological: Negative for dizziness and headaches.  Psychiatric/Behavioral: Negative for depression. The patient is not nervous/anxious and does not have insomnia.       Observations/Objective: Physical Exam  Constitutional: She is oriented to person, place, and time.  Neurological: She is alert and oriented to person, place, and time.  Psychiatric: Mood, memory, affect and judgment normal.   Patient reports morning stiffness for 10-20 minutes.   Patient denies nocturnal pain.  Difficulty dressing/grooming: Reports Difficulty climbing stairs: Reports Difficulty getting out of chair: Reports Difficulty using hands for taps, buttons, cutlery, and/or writing: Reports   Assessment and Plan: Diagnoses and all orders for this visit:  Rheumatoid arthritis involving multiple sites, unspecified whether rheumatoid factor present (HCC)- Comments: Erosive disease, Previously Dr. Truslow pt, last seen in 2013.  According to patient she was treated with methotrexate for couple of years and   then was on low-dose prednisone.  I could review x-ray of her bilateral feet which shows erosive changes.  She could not have been on any treatment due to recurrent ulcers.  She also did not have any follow-up with the rheumatologist.  I will obtain labs today.  We will  also get x-rays. Orders: -     ESR; Future -     RF; Future -     CCP; Future -     14-3-3 eta Protein; Future  High risk medication use Comments: Previously on MTX 25 mg once weekly, folic acid, prednisone 5 mg po daily.  According to patient all the medications were discontinued in 2013.  She was on methotrexate for 2 years. Orders: In anticipation to start her on immunosuppressive therapy I will obtain following labs today. -     CBC with diff; Future -     CMP with GFR; Future -     Hepatitis B core antibody, IgM; Future -     Hepatitis B surface antigen; Future -     Hepatitis C antibody; Future -     HIV antibody; Future -     QuantiFERON-TB Gold Plus; Future -     Serum protein electrophoresis with reflex; Future -     Immunoglobulins; Future -     G6PD; Future -     TPMT; Future  Pain in both hands-she complains of deformities and discomfort in her both hands.  She denies any swelling.  Chronic pain of both knees-she complains of pain in her bilateral knee joints.  Pain in both feet-I reviewed x-rays of her feet from June 2020 which showed severe erosive disease in her bilateral MTPs and some of the PIP joints.  Subluxation of the MCPs was also noted.  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 -     Uric acid; Future  History of foot ulcer Comments: Patient gives history of recurrent diabetic foot ulcer and even gangrene of the toe.  Patient has been having recurrent foot ulcer.  It will be challenging to immunosuppressive her.  We had detailed discussion regarding this as well.  I reviewed records from the wound center.  Other fatigue -     CK (Creatine Kinase); Future  Essential hypertension  History of hypothyroidism  History of gastroesophageal reflux (GERD)  Diabetic peripheral neuropathy (HCC)  History of type 2 diabetes mellitus  PAD (peripheral artery disease) (HCC)  Hx of iron deficiency anemia  Restless leg  syndrome  Chronic renal insufficiency-according to patient she has been followed by nephrologist.  Pedal edema    Follow Up Instructions: She will follow up in    I discussed the assessment and treatment plan with the patient. The patient was provided an opportunity to ask questions and all were answered. The patient agreed with the plan and demonstrated an understanding of the instructions.   The patient was advised to call back or seek an in-person evaluation if the symptoms worsen or if the condition fails to improve as anticipated.  I provided 45 minutes of non-face-to-face time during this encounter.   Bo Merino, MD

## 2019-07-25 ENCOUNTER — Other Ambulatory Visit: Payer: Self-pay | Admitting: Family Medicine

## 2019-07-26 ENCOUNTER — Other Ambulatory Visit: Payer: Self-pay

## 2019-07-26 ENCOUNTER — Encounter: Payer: Self-pay | Admitting: Rheumatology

## 2019-07-26 ENCOUNTER — Telehealth (INDEPENDENT_AMBULATORY_CARE_PROVIDER_SITE_OTHER): Payer: Medicare Other | Admitting: Rheumatology

## 2019-07-26 DIAGNOSIS — I10 Essential (primary) hypertension: Secondary | ICD-10-CM

## 2019-07-26 DIAGNOSIS — M79671 Pain in right foot: Secondary | ICD-10-CM

## 2019-07-26 DIAGNOSIS — Z862 Personal history of diseases of the blood and blood-forming organs and certain disorders involving the immune mechanism: Secondary | ICD-10-CM

## 2019-07-26 DIAGNOSIS — G8929 Other chronic pain: Secondary | ICD-10-CM

## 2019-07-26 DIAGNOSIS — M79641 Pain in right hand: Secondary | ICD-10-CM

## 2019-07-26 DIAGNOSIS — Z8639 Personal history of other endocrine, nutritional and metabolic disease: Secondary | ICD-10-CM

## 2019-07-26 DIAGNOSIS — M25562 Pain in left knee: Secondary | ICD-10-CM

## 2019-07-26 DIAGNOSIS — M79672 Pain in left foot: Secondary | ICD-10-CM

## 2019-07-26 DIAGNOSIS — E1142 Type 2 diabetes mellitus with diabetic polyneuropathy: Secondary | ICD-10-CM

## 2019-07-26 DIAGNOSIS — M25561 Pain in right knee: Secondary | ICD-10-CM | POA: Diagnosis not present

## 2019-07-26 DIAGNOSIS — R5383 Other fatigue: Secondary | ICD-10-CM

## 2019-07-26 DIAGNOSIS — Z79899 Other long term (current) drug therapy: Secondary | ICD-10-CM

## 2019-07-26 DIAGNOSIS — Z8719 Personal history of other diseases of the digestive system: Secondary | ICD-10-CM

## 2019-07-26 DIAGNOSIS — M79642 Pain in left hand: Secondary | ICD-10-CM

## 2019-07-26 DIAGNOSIS — I739 Peripheral vascular disease, unspecified: Secondary | ICD-10-CM

## 2019-07-26 DIAGNOSIS — M069 Rheumatoid arthritis, unspecified: Secondary | ICD-10-CM

## 2019-07-26 DIAGNOSIS — Z872 Personal history of diseases of the skin and subcutaneous tissue: Secondary | ICD-10-CM

## 2019-07-27 NOTE — Progress Notes (Signed)
[name], [name] ([id number]) Visit Report for 07/21/2019 Arrival Information Details Patient Name: Date of Service: [name], [name] 07/21/2019 12:30 PM Medical Record [id number] Patient Account Number: [id number] Date of Birth/Sex: Treating RN: 04-Feb-1946 (73 y.o. [name] Primary Care Amauria Younts: Chrisandra Netters Other Clinician: Referring Tytus Strahle: Treating Marleigh Kaylor/Extender:Stone III, Anabel Bene, Ivan Anchors in Treatment: 0 Visit Information History Since Last Visit Added or deleted any medications: No Patient Arrived: [name] Any new allergies or adverse reactions: No Arrival Time: 12:43 Had a fall or experienced change in No Accompanied By: alone activities of daily living that may affect Transfer Assistance: None risk of falls: Patient Identification Verified: Yes Signs or symptoms of abuse/neglect since last No Secondary Verification Process Yes visito Completed: Hospitalized since last visit: No Patient Requires Transmission- No Implantable device outside of the clinic excluding No Based Precautions: cellular tissue based products placed in the center Patient Has Alerts: Yes since last visit: Patient Alerts: right non Has Dressing in Place as Prescribed: Yes compressable Has Compression in Place as Prescribed: Yes Pain Present Now: No Electronic Signature(s) Signed: 07/26/2019 5:51:44 PM By: Levan Hurst RN, BSN Entered By: Levan Hurst on 07/21/2019 12:44:15 -------------------------------------------------------------------------------- Clinic Level of Care Assessment Details Patient Name: Date of Service: [name] 07/21/2019 12:30 PM Medical Record [id number] Patient Account Number: [id number] Date of Birth/Sex: Treating RN: 1946/08/03 (73 y.o. [name] Primary Care Zuri Bradway: Chrisandra Netters Other Clinician: Referring Amardeep Beckers: Treating Donzella Carrol/Extender:Stone III, Anabel Bene, Ivan Anchors in Treatment:  0 Clinic Level of Care Assessment Items TOOL 4 Quantity Score []  - Use when only an EandM is performed on FOLLOW-UP visit 0 ASSESSMENTS - Nursing Assessment / Reassessment X - Reassessment of Co-morbidities (includes updates in patient status) 1 10 X - Reassessment of Adherence to Treatment Plan 1 5 ASSESSMENTS - Wound and Skin Assessment / Reassessment X - Simple Wound Assessment / Reassessment - one wound 1 5 []  - Complex Wound Assessment / Reassessment - multiple wounds 0 []  - Dermatologic / Skin Assessment (not related to wound area) 0 ASSESSMENTS - Focused Assessment []  - Circumferential Edema Measurements - multi extremities 0 []  - Nutritional Assessment / Counseling / Intervention 0 X - Lower Extremity Assessment (monofilament, tuning fork, pulses) 1 5 []  - Peripheral Arterial Disease Assessment (using hand held doppler) 0 ASSESSMENTS - Ostomy and/or Continence Assessment and Care []  - Incontinence Assessment and Management 0 []  - Ostomy Care Assessment and Management (repouching, etc.) 0 PROCESS - Coordination of Care X - Simple Patient / Family Education for ongoing care 1 15 []  - Complex (extensive) Patient / Family Education for ongoing care 0 X - Staff obtains Programmer, systems, Records, Test Results / Process Orders 1 10 X - Staff telephones HHA, Nursing Homes / Clarify orders / etc 1 10 []  - Routine Transfer to another Facility (non-emergent condition) 0 []  - Routine Hospital Admission (non-emergent condition) 0 []  - New Admissions / Biomedical engineer / Ordering NPWT, Apligraf, etc. 0 []  - Emergency Hospital Admission (emergent condition) 0 X - Simple Discharge Coordination 1 10 []  - Complex (extensive) Discharge Coordination 0 PROCESS - Special Needs []  - Pediatric / Minor Patient Management 0 []  - Isolation Patient Management 0 []  - Hearing / Language / Visual special needs 0 []  - Assessment of Community assistance (transportation, D/C planning, etc.) 0 []  -  Additional assistance / Altered mentation 0 []  - Support Surface(s) Assessment (bed, cushion, seat, etc.) 0 INTERVENTIONS - Wound Cleansing / Measurement X - Simple Wound Cleansing - one  wound 1 5 []  - Complex Wound Cleansing - multiple wounds 0 X - Wound Imaging (photographs - any number of wounds) 1 5 []  - Wound Tracing (instead of photographs) 0 X - Simple Wound Measurement - one wound 1 5 []  - Complex Wound Measurement - multiple wounds 0 INTERVENTIONS - Wound Dressings X - Small Wound Dressing one or multiple wounds 1 10 []  - Medium Wound Dressing one or multiple wounds 0 []  - Large Wound Dressing one or multiple wounds 0 []  - Application of Medications - topical 0 []  - Application of Medications - injection 0 INTERVENTIONS - Miscellaneous []  - External ear exam 0 []  - Specimen Collection (cultures, biopsies, blood, body fluids, etc.) 0 []  - Specimen(s) / Culture(s) sent or taken to Lab for analysis 0 []  - Patient Transfer (multiple staff / Civil Service fast streamer / Similar devices) 0 []  - Simple Staple / Suture removal (25 or less) 0 []  - Complex Staple / Suture removal (26 or more) 0 []  - Hypo / Hyperglycemic Management (close monitor of Blood Glucose) 0 []  - Ankle / Brachial Index (ABI) - do not check if billed separately 0 X - Vital Signs 1 5 Has the patient been seen at the hospital within the last three years: Yes Total Score: 100 Level Of Care: New/Established - Level 3 Electronic Signature(s) Signed: 07/21/2019 6:22:02 PM By: Baruch Gouty RN, BSN Entered By: Baruch Gouty on 07/21/2019 13:15:30 -------------------------------------------------------------------------------- Encounter Discharge Information Details Patient Name: Date of Service: [name]. 07/21/2019 12:30 PM Medical Record [id number] Patient Account Number: [id number] Date of Birth/Sex: Treating RN: 1946/06/01 (73 y.o. [name] Primary Care Amaiyah Nordhoff: Chrisandra Netters Other  Clinician: Referring Jaksen Fiorella: Treating Ashley Bultema/Extender:Stone III, Anabel Bene, Ivan Anchors in Treatment: 0 Encounter Discharge Information Items Discharge Condition: Stable Ambulatory Status: Walker Discharge Destination: Home Transportation: Private Auto Accompanied By: self Schedule Follow-up Appointment: Yes Clinical Summary of Care: Patient Declined Electronic Signature(s) Signed: 07/21/2019 6:22:02 PM By: Baruch Gouty RN, BSN Entered By: Baruch Gouty on 07/21/2019 13:37:02 -------------------------------------------------------------------------------- Lower Extremity Assessment Details Patient Name: Date of Service: [name], [name] 07/21/2019 12:30 PM Medical Record [id number] Patient Account Number: [id number] Date of Birth/Sex: Treating RN: 1946-07-21 (73 y.o. [name] Primary Care Eliam Snapp: Chrisandra Netters Other Clinician: Referring Kavari Parrillo: Treating Amanee Iacovelli/Extender:Stone III, Anabel Bene, Ivan Anchors in Treatment: 0 Edema Assessment Assessed: [Left: No] [Right: No] Edema: [Left: Yes] [Right: Yes] Calf Left: Right: Point of Measurement: 31 cm From Medial Instep 30 cm cm Ankle Left: Right: Point of Measurement: 11.5 cm From Medial Instep 22 cm cm Vascular Assessment Pulses: Dorsalis Pedis Palpable: [Left:Yes] Electronic Signature(s) Signed: 07/26/2019 5:51:44 PM By: Levan Hurst RN, BSN Entered By: Levan Hurst on 07/21/2019 12:50:15 -------------------------------------------------------------------------------- [place] Details Patient Name: Date of Service: [name]. 07/21/2019 12:30 PM Medical Record [id number] Patient Account Number: [id number] Date of Birth/Sex: Treating RN: May 29, 1946 (73 y.o. [name] Primary Care Daleysa Kristiansen: Chrisandra Netters Other Clinician: Referring Malayiah Mcbrayer: Treating Jennifier Smitherman/Extender:Stone III, Anabel Bene, Ivan Anchors in Treatment: 0 Active  Inactive Electronic Signature(s) Signed: 07/21/2019 6:22:02 PM By: Baruch Gouty RN, BSN Entered By: Baruch Gouty on 07/21/2019 13:14:40 -------------------------------------------------------------------------------- Pain Assessment Details Patient Name: Date of Service: [name] 07/21/2019 12:30 PM Medical Record [id number] Patient Account Number: [id number] Date of Birth/Sex: Treating RN: 1946-05-02 (73 y.o. [name] Primary Care Kailen Name: Chrisandra Netters Other Clinician: Referring Andrya Roppolo: Treating Taralynn Quiett/Extender:Stone III, Anabel Bene, Ivan Anchors in Treatment: 0 Active Problems Location of Pain Severity and Description of Pain  Patient Has Paino No Site Locations Pain Management and Medication Current Pain Management: Electronic Signature(s) Signed: 07/26/2019 5:51:44 PM By: Levan Hurst RN, BSN Entered By: Levan Hurst on 07/21/2019 12:44:24 -------------------------------------------------------------------------------- Patient/Caregiver Education Details Patient Name: Date of Service: [name] 12/2/2020andnbsp12:30 PM Medical Record Patient Account Number: [id number] Number: Treating RN: Baruch Gouty Date of Birth/Gender: 30-Mar-1946 (73 y.o. F) Other Clinician: Primary Care Physician: Chrisandra Netters Treating Worthy Keeler Referring Physician: Physician/Extender: Tomasa Blase in Treatment: 0 Education Assessment Education Provided To: Patient Education Topics Provided Offloading: Methods: Explain/Verbal Responses: Reinforcements needed, State content correctly Wound/Skin Impairment: Methods: Explain/Verbal Responses: Reinforcements needed, State content correctly Electronic Signature(s) Signed: 07/21/2019 6:22:02 PM By: Baruch Gouty RN, BSN Entered By: Baruch Gouty on 07/21/2019 13:15:02 -------------------------------------------------------------------------------- Wound  Assessment Details Patient Name: Date of Service: [name] 07/21/2019 12:30 PM Medical Record [id number] Patient Account Number: [id number] Date of Birth/Sex: Treating RN: [birth year] (73 y.o. [name] Primary Care Chanler Mendonca: Chrisandra Netters Other Clinician: Referring Lelah Rennaker: Treating Jeshurun Oaxaca/Extender:Stone III, Anabel Bene, Ivan Anchors in Treatment: 0 Wound Status Wound Number: 19 Primary Diabetic Wound/Ulcer of the Lower Etiology: Extremity Wound Location: Left Foot - Plantar Wound Healed - Epithelialized Wounding Event: Gradually Appeared Status: Date Acquired: 08/07/2018 Comorbid Cataracts, Hypertension, Type II Diabetes, Weeks Of Treatment: 48 History: Rheumatoid Arthritis Clustered Wound: Yes Photos Wound Measurements Length: (cm) 0 % Reduction in Width: (cm) 0 % Reduction in Depth: (cm) 0 Epithelializat Clustered Quantity: 1 Tunneling: Area: (cm) 0 Undermining: Volume: (cm) 0 Wound Description Classification: Grade 2 Foul Odor Aft Wound Margin: Flat and Intact Slough/Fibrin Exudate Amount: None Present Wound Bed Granulation Amount: None Present (0%) Necrotic Amount: None Present (0%) Fascia Expose Fat Layer (Su Tendon Expose Muscle Expose Joint Exposed Bone Exposed: er Cleansing: No o No Exposed Structure d: No bcutaneous Tissue) Exposed: No d: No d: No : No No Area: 100% Volume: 100% ion: Large (67-100%) No No Treatment Notes Wound #19 (Left, Plantar Foot) 5. Secured With Tape Notes . foam donut . juxtalite HD to bilateral lower legs. Electronic Signature(s) Signed: 07/26/2019 5:51:44 PM By: Levan Hurst RN, BSN Signed: 07/27/2019 4:34:42 PM By: Mikeal Hawthorne EMT/HBOT Entered By: Mikeal Hawthorne on 07/26/2019 13:33:03 -------------------------------------------------------------------------------- Vitals Details Patient Name: Date of Service: [name]. 07/21/2019 12:30 PM Medical Record  [id number] Patient Account Number: [id number] Date of Birth/Sex: Treating RN: 1946-08-19 (73 y.o. [name] Primary Care Roma Bierlein: Chrisandra Netters Other Clinician: Referring Gitty Osterlund: Treating Dalal Livengood/Extender:Stone III, Anabel Bene, Ivan Anchors in Treatment: 0 Vital Signs Time Taken: 12:45 Temperature (F): 97.9 Height (in): 62 Pulse (bpm): 73 Weight (lbs): 140 Respiratory Rate (breaths/min): 16 Body Mass Index (BMI): 25.6 Blood Pressure (mmHg): 163/79 Reference Range: 80 - 120 mg / dl Electronic Signature(s) Signed: 07/26/2019 5:51:44 PM By: Levan Hurst RN, BSN Entered By: Levan Hurst on 07/21/2019 12:46:52

## 2019-07-27 NOTE — Progress Notes (Signed)
NATOYA, VISCOMI ([id number]) Visit Report for 06/30/2019 Arrival Information Details Patient Name: Date of Service: [name], [name] 06/30/2019 12:30 PM Medical Record [id number] Patient Account Number: [id number] Date of Birth/Sex: Treating RN: 1945/09/28 (73 y.o. [name] Primary Care Neelam Tiggs: Chrisandra Netters Other Clinician: Referring Memori Sammon: Treating Armonee Bojanowski/Extender:Stone III, Anabel Bene, Ivan Anchors in Treatment: 70 Visit Information History Since Last Visit All ordered tests and consults were completed: No Patient Arrived: [name] Added or deleted any medications: No Arrival Time: 12:38 Any new allergies or adverse reactions: No Accompanied By: self Had a fall or experienced change in No Transfer Assistance: None activities of daily living that may affect Patient Identification Verified: Yes risk of falls: Secondary Verification Process Yes Signs or symptoms of abuse/neglect since last No Completed: visito Patient Requires Transmission- No Hospitalized since last visit: No Based Precautions: Implantable device outside of the clinic excluding No Patient Has Alerts: Yes cellular tissue based products placed in the center Patient Alerts: right non since last visit: compressable Has Dressing in Place as Prescribed: Yes Has Compression in Place as Prescribed: Yes Pain Present Now: No Electronic Signature(s) Signed: 07/27/2019 2:53:59 PM By: Carlene Coria RN Entered By: Carlene Coria on 06/30/2019 12:39:45 -------------------------------------------------------------------------------- Encounter Discharge Information Details Patient Name: Date of Service: [name] 06/30/2019 12:30 PM Medical Record [id number] Patient Account Number: [id number] Date of Birth/Sex: Treating RN: Jan 14, 1946 (73 y.o. [name] Primary Care Harnoor Reta: Chrisandra Netters Other Clinician: Referring Joycie Aerts: Treating Braxson Hollingsworth/Extender:Stone III,  Anabel Bene, Ivan Anchors in Treatment: 79 Encounter Discharge Information Items Post Procedure Vitals Discharge Condition: Stable Temperature (F): 97.9 Ambulatory Status: Walker Pulse (bpm): 82 Discharge Destination: Home Respiratory Rate (breaths/min): 18 Transportation: Private Auto Blood Pressure (mmHg): 143/66 Accompanied By: self Schedule Follow-up Appointment: Yes Clinical Summary of Care: Electronic Signature(s) Signed: 06/30/2019 5:42:00 PM By: Deon Pilling Entered By: Deon Pilling on 06/30/2019 13:32:58 -------------------------------------------------------------------------------- Lower Extremity Assessment Details Patient Name: Date of Service: MARGUARITE, MARKOV 06/30/2019 12:30 PM Medical Record [id number] Patient Account Number: [id number] Date of Birth/Sex: Treating RN: Feb 04, 1946 (73 y.o. [name] Primary Care Aithan Farrelly: Chrisandra Netters Other Clinician: Referring Leandrea Ackley: Treating Padme Arriaga/Extender:Stone III, Anabel Bene, Ivan Anchors in Treatment: 51 Edema Assessment Assessed: [Left: No] [Right: No] Edema: [Left: Yes] [Right: Yes] Calf Left: Right: Point of Measurement: 31 cm From Medial Instep 30 cm cm Ankle Left: Right: Point of Measurement: 11.5 cm From Medial Instep 25 cm cm Electronic Signature(s) Signed: 07/27/2019 2:53:59 PM By: Carlene Coria RN Entered By: Carlene Coria on 06/30/2019 12:48:51 -------------------------------------------------------------------------------- [place] Details Patient Name: Date of Service: [name] 06/30/2019 12:30 PM Medical Record [id number] Patient Account Number: [id number] Date of Birth/Sex: Treating RN: [birth year] (73 y.o. [name] Primary Care Selda Jalbert: Chrisandra Netters Other Clinician: Referring Ronal Maybury: Treating Jacoby Zanni/Extender:Stone III, Anabel Bene, Ivan Anchors in Treatment: 82 Active Inactive Wound/Skin Impairment Nursing  Diagnoses: Impaired tissue integrity Knowledge deficit related to ulceration/compromised skin integrity Goals: Patient/caregiver will verbalize understanding of skin care regimen Date Initiated: 07/03/2018 Target Resolution Date: 07/16/2019 Goal Status: Active Ulcer/skin breakdown will have a volume reduction of 30% by week 4 Target Resolution Date Initiated: 07/03/2018 Date Inactivated: 07/31/2018 Date: 07/31/2018 Unmet Reason: necrotic Goal Status: Unmet toes, had vascular procedure Ulcer/skin breakdown will have a volume reduction of 50% by week 8 Date Initiated: 07/31/2018 Date Inactivated: 08/31/2018 Target Resolution Date: 09/04/2018 Goal Status: Met Ulcer/skin breakdown will have a volume reduction of 80% by week 12 Date Initiated: 08/31/2018 Date Inactivated: 10/05/2018 Target Resolution Date: 10/02/2018  Goal Status: Unmet Unmet Reason: PAD Interventions: Assess patient/caregiver ability to obtain necessary supplies Assess patient/caregiver ability to perform ulcer/skin care regimen upon admission and as needed Assess ulceration(s) every visit Provide education on ulcer and skin care Treatment Activities: Patient referred to home care : 07/03/2018 Skin care regimen initiated : 07/03/2018 Topical wound management initiated : 07/03/2018 Notes: Electronic Signature(s) Signed: 06/30/2019 5:49:26 PM By: Baruch Gouty RN, BSN Entered By: Baruch Gouty on 06/30/2019 13:06:57 -------------------------------------------------------------------------------- Pain Assessment Details Patient Name: Date of Service: [name] 06/30/2019 12:30 PM Medical Record [id number] Patient Account Number: [id number] Date of Birth/Sex: Treating RN: 03-14-1946 (73 y.o. [name] Primary Care Kethan Papadopoulos: Chrisandra Netters Other Clinician: Referring Iyanah Demont: Treating Tyvon Eggenberger/Extender:Stone III, Anabel Bene, Ivan Anchors in Treatment: 51 Active Problems Location of  Pain Severity and Description of Pain Patient Has Paino No Site Locations Pain Management and Medication Current Pain Management: Electronic Signature(s) Signed: 07/27/2019 2:53:59 PM By: Carlene Coria RN Entered By: Carlene Coria on 06/30/2019 12:40:44 -------------------------------------------------------------------------------- Patient/Caregiver Education Details Patient Name: [name] 11/11/2020andnbsp12:30 Date of Service: PM Medical Record [id number] Number: Patient Account Number: [id number] Treating RN: Date of Birth/Gender: [birth year] (73 y.o. [name]) Other Clinician: Primary Care Physician:McIntyre, Charlotte Crumb Worthy Keeler Referring Physician: Physician/Extender: Tomasa Blase in Treatment: 26 Education Assessment Education Provided To: Patient Education Topics Provided Offloading: Methods: Explain/Verbal Responses: Reinforcements needed, State content correctly Wound/Skin Impairment: Methods: Explain/Verbal Responses: Reinforcements needed, State content correctly Electronic Signature(s) Signed: 06/30/2019 5:49:26 PM By: Baruch Gouty RN, BSN Entered By: Baruch Gouty on 06/30/2019 13:07:50 -------------------------------------------------------------------------------- Wound Assessment Details Patient Name: Date of Service: [name] 06/30/2019 12:30 PM Medical Record [id number] Patient Account Number: [id number] Date of Birth/Sex: Treating RN: 1945-09-02 (73 y.o. [name] Primary Care Jaion Lagrange: Chrisandra Netters Other Clinician: Referring Avrohom Mckelvin: Treating Kedric Bumgarner/Extender:Stone III, Anabel Bene, Ivan Anchors in Treatment: 51 Wound Status Wound Number: 19 Primary Diabetic Wound/Ulcer of the Lower Etiology: Extremity Wound Location: Left Foot - Plantar Wound Open Wounding Event: Gradually Appeared Status: Date Acquired: 08/07/2018 Comorbid Cataracts, Hypertension, Type II  Diabetes, Weeks Of Treatment: 45 History: Rheumatoid Arthritis Clustered Wound: Yes Photos Wound Measurements Length: (cm) 0.6 Width: (cm) 2.7 Depth: (cm) 0.2 Clustered Quantity: 2 Area: (cm) 1.272 Volume: (cm) 0.254 Wound Description Classification: Grade 2 Wound Margin: Flat and Intact Exudate Amount: Medium Exudate Type: Serosanguineous Exudate Color: red, brown Wound Bed Granulation Amount: Small (1-33%) Granulation Quality: Pink Necrotic Amount: Large (67-100%) Necrotic Quality: Adherent Slough After Cleansing: No rino Yes Exposed Structure sed: No Subcutaneous Tissue) Exposed: Yes sed: No sed: No ed: No d: No % Reduction in Area: -439% % Reduction in Volume: -440.4% Epithelialization: Large (67-100%) Tunneling: No Undermining: No Foul Odor Slough/Fib Fascia Expo Fat Layer ( Tendon Expo Muscle Expo Joint Expos Bone Expose Electronic Signature(s) Signed: 07/02/2019 4:02:07 PM By: Mikeal Hawthorne EMT/HBOT Signed: 07/27/2019 2:53:59 PM By: Carlene Coria RN Entered By: Mikeal Hawthorne on 07/02/2019 09:39:26 -------------------------------------------------------------------------------- Vitals Details Patient Name: Date of Service: [name] 06/30/2019 12:30 PM Medical Record [id number] Patient Account Number: [id number] Date of Birth/Sex: Treating RN: [birth year] (73 y.o. [name] Primary Care Bryttany Tortorelli: Chrisandra Netters Other Clinician: Referring Ilhan Madan: Treating Yosiah Jasmin/Extender:Stone III, Anabel Bene, Ivan Anchors in Treatment: 51 Vital Signs Time Taken: 12:40 Temperature (F): 97.9 Height (in): 62 Pulse (bpm): 82 Weight (lbs): 140 Respiratory Rate (breaths/min): 18 Body Mass Index (BMI): 25.6 Blood Pressure (mmHg): 143/66 Reference Range: 80 - 120 mg / dl Electronic Signature(s) Signed: 07/27/2019 2:53:59 PM By:  Carlene Coria RN Entered By: Carlene Coria on 06/30/2019 12:40:37

## 2019-07-27 NOTE — Progress Notes (Signed)
[name], [name] ([id number]) Visit Report for 04/05/2019 Arrival Information Details Patient Name: Date of Service: [name], [name] 04/05/2019 12:30 PM Medical Record [id number] Patient Account Number: [id number] Date of Birth/Sex: Treating RN: July 16, 1946 (73 y.o. [name] Primary Care Juliann Olesky: Chrisandra Netters Other Clinician: Referring Emer Onnen: Treating Iam Lipson/Extender:Robson, Nonda Lou, Ivan Anchors in Treatment: 39 Visit Information History Since Last Visit All ordered tests and consults were completed: No Patient Arrived: Wheel Chair Added or deleted any medications: No Arrival Time: 12:40 Any new allergies or adverse reactions: No Accompanied By: self Had a fall or experienced change in No Transfer Assistance: None activities of daily living that may affect Patient Identification Verified: Yes risk of falls: Secondary Verification Process Yes Signs or symptoms of abuse/neglect since last No Completed: visito Patient Requires Transmission-Based No Hospitalized since last visit: No Precautions: Implantable device outside of the clinic excluding No Patient Has Alerts: Yes cellular tissue based products placed in the center Patient Alerts: right non since last visit: compressable Has Dressing in Place as Prescribed: Yes Has Compression in Place as Prescribed: Yes Pain Present Now: No Electronic Signature(s) Signed: 07/27/2019 3:07:45 PM By: Carlene Coria RN Entered By: Carlene Coria on 04/05/2019 12:46:18 -------------------------------------------------------------------------------- Clinic Level of Care Assessment Details Patient Name: Date of Service: [name], [name] 04/05/2019 12:30 PM Medical Record [id number] Patient Account Number: [id number] Date of Birth/Sex: Treating RN: 12-Sep-1945 (73 y.o. [name] Primary Care Duwayne Matters: Chrisandra Netters Other Clinician: Referring Lori Popowski: Treating Jeanice Dempsey/Extender:Robson,  Nonda Lou, Ivan Anchors in Treatment: 39 Clinic Level of Care Assessment Items TOOL 4 Quantity Score X - Use when only an EandM is performed on FOLLOW-UP visit 1 0 ASSESSMENTS - Nursing Assessment / Reassessment X - Reassessment of Co-morbidities (includes updates in patient status) 1 10 X - Reassessment of Adherence to Treatment Plan 1 5 ASSESSMENTS - Wound and Skin Assessment / Reassessment X - Simple Wound Assessment / Reassessment - one wound 1 5 []  - Complex Wound Assessment / Reassessment - multiple wounds 0 []  - Dermatologic / Skin Assessment (not related to wound area) 0 ASSESSMENTS - Focused Assessment []  - Circumferential Edema Measurements - multi extremities 0 []  - Nutritional Assessment / Counseling / Intervention 0 X - Lower Extremity Assessment (monofilament, tuning fork, pulses) 1 5 []  - Peripheral Arterial Disease Assessment (using hand held doppler) 0 ASSESSMENTS - Ostomy and/or Continence Assessment and Care []  - Incontinence Assessment and Management 0 []  - Ostomy Care Assessment and Management (repouching, etc.) 0 PROCESS - Coordination of Care X - Simple Patient / Family Education for ongoing care 1 15 []  - Complex (extensive) Patient / Family Education for ongoing care 0 X - Staff obtains Programmer, systems, Records, Test Results / Process Orders 1 10 X - Staff telephones HHA, Nursing Homes / Clarify orders / etc 1 10 []  - Routine Transfer to another Facility (non-emergent condition) 0 []  - Routine Hospital Admission (non-emergent condition) 0 []  - New Admissions / Biomedical engineer / Ordering NPWT, Apligraf, etc. 0 []  - Emergency Hospital Admission (emergent condition) 0 X - Simple Discharge Coordination 1 10 []  - Complex (extensive) Discharge Coordination 0 PROCESS - Special Needs []  - Pediatric / Minor Patient Management 0 []  - Isolation Patient Management 0 []  - Hearing / Language / Visual special needs 0 []  - Assessment of Community assistance  (transportation, D/C planning, etc.) 0 []  - Additional assistance / Altered mentation 0 []  - Support Surface(s) Assessment (bed, cushion, seat, etc.) 0 INTERVENTIONS - Wound Cleansing / Measurement X -  Simple Wound Cleansing - one wound 1 5 []  - Complex Wound Cleansing - multiple wounds 0 X - Wound Imaging (photographs - any number of wounds) 1 5 []  - Wound Tracing (instead of photographs) 0 X - Simple Wound Measurement - one wound 1 5 []  - Complex Wound Measurement - multiple wounds 0 INTERVENTIONS - Wound Dressings []  - Small Wound Dressing one or multiple wounds 0 X - Medium Wound Dressing one or multiple wounds 1 15 []  - Large Wound Dressing one or multiple wounds 0 X - Application of Medications - topical 1 5 []  - Application of Medications - injection 0 INTERVENTIONS - Miscellaneous []  - External ear exam 0 []  - Specimen Collection (cultures, biopsies, blood, body fluids, etc.) 0 []  - Specimen(s) / Culture(s) sent or taken to Lab for analysis 0 []  - Patient Transfer (multiple staff / Civil Service fast streamer / Similar devices) 0 []  - Simple Staple / Suture removal (25 or less) 0 []  - Complex Staple / Suture removal (26 or more) 0 []  - Hypo / Hyperglycemic Management (close monitor of Blood Glucose) 0 []  - Ankle / Brachial Index (ABI) - do not check if billed separately 0 X - Vital Signs 1 5 Has the patient been seen at the hospital within the last three years: Yes Total Score: 110 Level Of Care: New/Established - Level 3 Electronic Signature(s) Signed: 04/05/2019 6:10:13 PM By: Levan Hurst RN, BSN Entered By: Levan Hurst on 04/05/2019 17:40:02 -------------------------------------------------------------------------------- Encounter Discharge Information Details Patient Name: Date of Service: Kari [place]. 04/05/2019 12:30 PM Medical Record [id number] Patient Account Number: [id number] Date of Birth/Sex: Treating RN: 04-23-1946 (73 y.o. [name] Primary Care  Sierria Bruney: Chrisandra Netters Other Clinician: Referring Uriel Horkey: Treating Lilla Callejo/Extender:Robson, Nonda Lou, Ivan Anchors in Treatment: 39 Encounter Discharge Information Items Discharge Condition: Stable Ambulatory Status: Walker Discharge Destination: Home Transportation: Private Auto Accompanied By: self Schedule Follow-up Appointment: Yes Clinical Summary of Care: Electronic Signature(s) Signed: 04/05/2019 6:04:22 PM By: Deon Pilling Entered By: Deon Pilling on 04/05/2019 14:00:38 -------------------------------------------------------------------------------- Lower Extremity Assessment Details Patient Name: Date of Service: Kerina, [place]. 04/05/2019 12:30 PM Medical Record [id number] Patient Account Number: [id number] Date of Birth/Sex: Treating RN: 04-13-1946 (73 y.o. [name] Primary Care Knut Rondinelli: Chrisandra Netters Other Clinician: Referring Jannelly Bergren: Treating Kyliegh Jester/Extender:Robson, Nonda Lou, Ivan Anchors in Treatment: 39 Edema Assessment Assessed: [Left: No] [Right: No] Edema: [Left: No] [Right: No] Calf Left: Right: Point of Measurement: 31 cm From Medial Instep 36 cm cm Ankle Left: Right: Point of Measurement: 11.5 cm From Medial Instep 22 cm cm Electronic Signature(s) Signed: 07/27/2019 3:07:45 PM By: Carlene Coria RN Entered By: Carlene Coria on 04/05/2019 12:57:20 -------------------------------------------------------------------------------- Multi Wound Chart Details Patient Name: Date of Service: Kari [place]. 04/05/2019 12:30 PM Medical Record [id number] Patient Account Number: [id number] Date of Birth/Sex: Treating RN: 1945/09/02 (73 y.o. 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] Comorbid History: [19:Cataracts, Hypertension, N/A Type II Diabetes, Rheumatoid Arthritis] Date Acquired: [19:08/07/2018] [N/A:N/A] Weeks of Treatment: [19:33] [N/A:N/A] Wound Status: [19:Open] [N/A:N/A] Measurements L x W x D 2.1x0.6x0.1 [N/A:N/A] (cm) Area (cm) : [19:0.99] [N/A:N/A] Volume (cm) : [19:0.099] [N/A:N/A] % Reduction in Area: [  19:-319.50%] [N/A:N/A] % Reduction in Volume: -110.60% [N/A:N/A] Classification: [19:Grade 2] [N/A:N/A] Exudate Amount: [19:Medium] [N/A:N/A] Exudate Type: [19:Serosanguineous] [N/A:N/A] Exudate Color: [19:red, brown] [N/A:N/A] Wound Margin: [19:Flat and Intact] [N/A:N/A] Granulation Amount: [19:Large (67-100%)] [N/A:N/A] Granulation Quality: [19:Red, Hyper-granulation] [N/A:N/A] Necrotic Amount: [19:None Present (0%)] [N/A:N/A] Exposed Structures: [19:Fascia: No Fat Layer (Subcutaneous Tissue) Exposed: No Tendon: No Muscle: No Joint: No Bone: No] [N/A:N/A] Epithelialization: [19:Large (67-100%)] [N/A:N/A] Electronic Signature(s) Signed: 04/05/2019 6:10:13 PM By: Levan Hurst RN, BSN Signed: 04/05/2019 6:32:44 PM By: Linton Ham MD Entered By: Linton Ham on 04/05/2019 13:20:58 -------------------------------------------------------------------------------- Multi-Disciplinary Care Plan Details Patient Name: Date of Service: Kari [place]. 04/05/2019 12:30 PM Medical Record [id number] Patient Account Number: [id number] Date of Birth/Sex: Treating RN: 10-17-1945 (73 y.o. [name] Primary Care Claudia Alvizo: Chrisandra Netters Other Clinician: Referring Christinea Brizuela: Treating Omolara Carol/Extender:Robson, Nonda Lou, Ivan Anchors  in Treatment: 39 Active Inactive Wound/Skin Impairment Nursing Diagnoses: Impaired tissue integrity Knowledge deficit related to ulceration/compromised skin integrity Goals: Patient/caregiver will verbalize understanding of skin care regimen Date Initiated: 07/03/2018 Target Resolution Date: 04/16/2019 Goal Status: Active Ulcer/skin breakdown will have a volume reduction of 30% by week 4 Date Initiated: 07/03/2018 Date Inactivated: 07/31/2018 Target Resolution Date: 07/31/2018 Unmet Reason: necrotic Goal Status: Unmet toes, had vascular procedure Ulcer/skin breakdown will have a volume reduction of 50% by week 8 Date Initiated: 07/31/2018 Date Inactivated: 08/31/2018 Target Resolution Date: 09/04/2018 Goal Status: Met Ulcer/skin breakdown will have a volume reduction of 80% by week 12 Date Initiated: 08/31/2018 Date Inactivated: 10/05/2018 Target Resolution Date: 10/02/2018 Goal Status: Unmet Unmet Reason: PAD Interventions: Assess patient/caregiver ability to obtain necessary supplies Assess patient/caregiver ability to perform ulcer/skin care regimen upon admission and as needed Assess ulceration(s) every visit Provide education on ulcer and skin care Treatment Activities: Patient referred to home care : 07/03/2018 Skin care regimen initiated : 07/03/2018 Topical wound management initiated : 07/03/2018 Notes: Electronic Signature(s) Signed: 04/05/2019 6:10:13 PM By: Levan Hurst RN, BSN Entered By: Levan Hurst on 04/05/2019 17:38:41 -------------------------------------------------------------------------------- Pain Assessment Details Patient Name: Date of Service: Kari [place]. 04/05/2019 12:30 PM Medical Record [id number] Patient Account Number: [id number] Date of Birth/Sex: Treating RN: [birth year] (73 y.o. [name] Primary Care Nikky Duba: Chrisandra Netters Other Clinician: Referring Carline Dura: Treating Laini Urick/Extender:Robson, Nonda Lou,  Ivan Anchors in Treatment: 39 Active Problems Location of Pain Severity and Description of Pain Patient Has Paino No Site Locations Pain Management and Medication Current Pain Management: Electronic Signature(s) Signed: 07/27/2019 3:07:45 PM By: Carlene Coria RN Entered By: Carlene Coria on 04/05/2019 12:49:07 -------------------------------------------------------------------------------- Patient/Caregiver Education Details Patient Name: Date of Service: Kari [place] 8/17/2020andnbsp12:30 PM Medical Record [id number] Patient Account Number: [id number] Date of Birth/Gender: [birth year] (73 y.o. F) Treating RN: Levan Hurst Primary Care Physician: Chrisandra Netters Other Clinician: Referring Physician: Treating Physician/Extender:Robson, Nonda Lou, Ivan Anchors in Treatment: 39 Education Assessment Education Provided To: Patient Education Topics Provided Wound/Skin Impairment: Methods: Explain/Verbal Responses: State content correctly Electronic Signature(s) Signed: 04/05/2019 6:10:13 PM By: Levan Hurst RN, BSN Entered By: Levan Hurst on 04/05/2019 17:38:53 -------------------------------------------------------------------------------- Wound Assessment Details Patient Name: Date of Service: Kari [place]. 04/05/2019 12:30 PM Medical Record [id number] Patient Account Number: [id number] Date of Birth/Sex: Treating RN: [birth year] (73 y.o. [name] Primary Care Andersen Iorio: Chrisandra Netters Other Clinician: Referring Murrel Freet: Treating Araf Clugston/Extender:Robson, Nonda Lou, Ivan Anchors in Treatment: 39 Wound Status Wound Number: 19 Primary Diabetic Wound/Ulcer of the Lower Etiology: Extremity Wound Location: Left Foot - Plantar Wound Open Wounding Event: Gradually Appeared Status: Date Acquired: 08/07/2018 Comorbid Cataracts,  Hypertension, Type II Diabetes, Weeks Of Treatment: 33 History: Rheumatoid Arthritis Clustered  Wound: No Photos Wound Measurements Length: (cm) 2.1 Width: (cm) 0.6 Depth: (cm) 0.1 Area: (cm) 0.99 Volume: (cm) 0.099 Wound Description Classification: Grade 2 Wound Margin: Flat and Intact Exudate Amount: Medium Exudate Type: Serosanguineous Exudate Color: red, brown Wound Bed Granulation Amount: Large (67-100%) Granulation Quality: Red, Hyper-granulation Necrotic Amount: None Present (0%) After Cleansing: No rino No Exposed Structure sed: No Subcutaneous Tissue) Exposed: No sed: No sed: No ed: No d: No % Reduction in Area: -319.5% % Reduction in Volume: -110.6% Epithelialization: Large (67-100%) Tunneling: No Undermining: No Foul Odor Slough/Fib Fascia Expo Fat Layer ( Tendon Expo Muscle Expo Joint Expos Bone Expose Electronic Signature(s) Signed: 04/06/2019 1:57:29 PM By: Mikeal Hawthorne EMT/HBOT Signed: 07/27/2019 3:07:45 PM By: Carlene Coria RN Entered By: Mikeal Hawthorne on 04/06/2019 13:23:40 -------------------------------------------------------------------------------- Vitals Details Patient Name: Date of Service: Kari [place]. 04/05/2019 12:30 PM Medical Record [id number] Patient Account Number: [id number] Date of Birth/Sex: Treating RN: 04/11/1946 (73 y.o. [name] Primary Care Darryl Willner: Chrisandra Netters Other Clinician: Referring Alyviah Crandle: Treating Laquinda Moller/Extender:Robson, Nonda Lou, Ivan Anchors in Treatment: 39 Vital Signs Time Taken: 12:46 Temperature (F): 97.7 Height (in): 62 Pulse (bpm): 75 Weight (lbs): 140 Respiratory Rate (breaths/min): 16 Body Mass Index (BMI): 25.6 Blood Pressure (mmHg): 126/74 Reference Range: 80 - 120 mg / dl Electronic Signature(s) Signed: 07/27/2019 3:07:45 PM By: Carlene Coria RN Entered By: Carlene Coria on 04/05/2019 12:47:15

## 2019-07-27 NOTE — Progress Notes (Signed)
[name], [name] ([id number]) Visit Report for 06/08/2019 Arrival Information Details Patient Name: Date of Service: [name] 06/08/2019 12:45 PM Medical Record [id number] Patient Account Number: [id number] Date of Birth/Sex: Treating RN: [birth year] (73 y.o. [name] Primary Care Khristian Phillippi: Chrisandra Netters Other Clinician: Referring Tevyn Codd: Treating Salif Tay/Extender:Robson, Nonda Lou, Ivan Anchors in Treatment: 27 Visit Information History Since Last Visit Added or deleted any medications: No Patient Arrived: [name] Any new allergies or adverse reactions: No Arrival Time: 13:13 Had a fall or experienced change in No Accompanied By: self activities of daily living that may affect Transfer Assistance: None risk of falls: Patient Identification Verified: Yes Signs or symptoms of abuse/neglect since last No Secondary Verification Process Yes visito Completed: Hospitalized since last visit: No Patient Requires Transmission-Based No Implantable device outside of the clinic excluding No Precautions: cellular tissue based products placed in the center Patient Has Alerts: Yes since last visit: Patient Alerts: right non Has Dressing in Place as Prescribed: Yes compressable Pain Present Now: No Electronic Signature(s) Signed: 07/27/2019 3:02:15 PM By: Sandre Kitty Entered By: Sandre Kitty on 06/08/2019 13:14:04 -------------------------------------------------------------------------------- Encounter Discharge Information Details Patient Name: Date of Service: [name] 06/08/2019 12:45 PM Medical Record [id number] Patient Account Number: [id number] Date of Birth/Sex: Treating RN: August 02, 1946 (73 y.o. [name] Primary Care Frankee Gritz: Chrisandra Netters Other Clinician: Referring Fusaye Wachtel: Treating Azelia Reiger/Extender:Robson, Nonda Lou, Ivan Anchors in Treatment: 48 Encounter Discharge Information Items Post  Procedure Vitals Discharge Condition: Stable Temperature (F): 97.8 Ambulatory Status: Walker Pulse (bpm): 80 Discharge Destination: Home Respiratory Rate (breaths/min): 17 Transportation: Private Auto Blood Pressure (mmHg): 166/72 Accompanied By: alone Schedule Follow-up Appointment: Yes Clinical Summary of Care: Patient Declined Electronic Signature(s) Signed: 06/09/2019 6:50:57 PM By: Levan Hurst RN, BSN Entered By: Levan Hurst on 06/08/2019 15:49:31 -------------------------------------------------------------------------------- Lower Extremity Assessment Details Patient Name: Date of Service: [name] 06/08/2019 12:45 PM Medical Record [id number] Patient Account Number: [id number] Date of Birth/Sex: Treating RN: 11-21-1945 (73 y.o. [name] Primary Care Ryne Mctigue: Chrisandra Netters Other Clinician: Referring Keithen Capo: Treating Addison Whidbee/Extender:Robson, Nonda Lou, Ivan Anchors in Treatment: 48 Edema Assessment Assessed: [Left: No] [Right: No] Edema: [Left: Yes] [Right: Yes] Calf Left: Right: Point of Measurement: 31 cm From Medial Instep 31 cm cm Ankle Left: Right: Point of Measurement: 11.5 cm From Medial Instep 27 cm cm Electronic Signature(s) Signed: 07/27/2019 3:01:15 PM By: Carlene Coria RN Entered By: Carlene Coria on 06/08/2019 13:39:39 -------------------------------------------------------------------------------- Multi Wound Chart Details Patient Name: Date of Service: [name] 06/08/2019 12:45 PM Medical Record [id number] Patient Account Number: [id number] Date of Birth/Sex: Treating RN: Jan 14, 1946 (73 y.o. 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] Comorbid History: [19:Cataracts, Hypertension, N/A Type II Diabetes, Rheumatoid Arthritis] Date Acquired: [19:08/07/2018] [N/A:N/A] Weeks of Treatment: [19:42] [N/A:N/A] Wound Status: [19:Open] [N/A:N/A] Clustered Wound: [19:Yes] [N/A:N/A] Clustered Quantity: [19:2] [N/A:N/A] Measurements L x W x D 0.3x0.7x0.1 [N/A:N/A] (cm) Area (cm) : [19:0.165] [N/A:N/A] Volume (cm) : [19:0.016] [N/A:N/A] % Reduction in Area: [19:30.10%] [N/A:N/A] % Reduction in Volume: 66.00% [N/A:N/A] Classification: [19:Grade 2] [N/A:N/A] Exudate Amount: [19:Medium] [N/A:N/A] Exudate Type: [19:Serosanguineous] [N/A:N/A] Exudate Color: [19:red, brown] [N/A:N/A] Wound Margin: [19:Flat and Intact] [N/A:N/A] Granulation  Amount: [19:Large (67-100%)] [N/A:N/A] Granulation Quality: [19:Red, Pink] [N/A:N/A] Necrotic Amount: [19:None Present (0%)] [N/A:N/A] Exposed Structures: [19:Fat Layer (Subcutaneous N/A Tissue) Exposed: Yes Fascia: No Tendon: No Muscle: No Joint: No Bone: No] Epithelialization: [19:Large (67-100%)] [N/A:N/A] Debridement: [19:Debridement - Selective/Open Wound] [N/A:N/A] Pre-procedure [19:14:10] [N/A:N/A] Verification/Time Out Taken: Pain Control: [19:Lidocaine 5% topical ointment] [N/A:N/A] Level: [19:Skin/Epidermis] [N/A:N/A] Debridement Area (sq cm):0.21 [N/A:N/A] Instrument: [19:Curette] [N/A:N/A] Bleeding: [19:Moderate] [N/A:N/A] Hemostasis Achieved: [19:Pressure] [N/A:N/A] Procedural Pain: [19:0] [N/A:N/A] Post Procedural Pain: [19:0] [N/A:N/A] Debridement Treatment Procedure was tolerated [N/A:N/A] Response: [19:well] Post Debridement [19:0.3x0.7x0.1] [N/A:N/A] Measurements L x W x D (cm) Post Debridement  [19:0.016] [N/A:N/A] Volume: (cm) Procedures Performed: Debridement [N/A:N/A] Treatment Notes Electronic Signature(s) Signed: 06/08/2019 6:17:56 PM By: Linton Ham MD Signed: 07/27/2019 3:01:15 PM By: Carlene Coria RN Entered By: Linton Ham on 06/08/2019 15:00:18 -------------------------------------------------------------------------------- Multi-Disciplinary Care Plan Details Patient Name: Date of Service: [name]. 06/08/2019 12:45 PM Medical Record [id number] Patient Account Number: [id number] Date of Birth/Sex: Treating RN: May 28, 1946 (73 y.o. [name] Primary Care Riven Beebe: Chrisandra Netters Other Clinician: Referring Jocelin Schuelke: Treating Lum Stillinger/Extender:Robson, Nonda Lou, Ivan Anchors in Treatment: 48 Active Inactive Wound/Skin Impairment Nursing Diagnoses: Impaired tissue integrity Knowledge deficit related to ulceration/compromised skin integrity Goals: Patient/caregiver will verbalize understanding of skin care regimen Date Initiated: 07/03/2018 Target Resolution Date: 06/18/2019 Goal Status: Active Ulcer/skin breakdown will have a volume reduction of 30% by week 4 Date Initiated: 07/03/2018 Date Inactivated: 07/31/2018 Target12/13/2019 Resolution Date: Unmet Reason: necrotic Goal Status: Unmet toes, had vascular procedure Ulcer/skin breakdown will have a volume reduction of 50% by week 8 Date Initiated: 07/31/2018 Date Inactivated: 08/31/2018 Target Resolution Date: 09/04/2018 Goal Status: Met Ulcer/skin breakdown will have a volume reduction of 80% by week 12 Date Initiated: 08/31/2018 Date Inactivated: 10/05/2018 Target Resolution Date: 10/02/2018 Goal Status: Unmet Unmet Reason: PAD Interventions: Assess patient/caregiver ability to obtain necessary supplies Assess patient/caregiver ability to perform ulcer/skin care regimen upon admission and as needed Assess ulceration(s) every visit Provide education on ulcer and skin  care Treatment Activities: Patient referred to home care : 07/03/2018 Skin care regimen initiated : 07/03/2018 Topical wound management initiated : 07/03/2018 Notes: Electronic Signature(s) Signed: 07/27/2019 3:01:15 PM By: Carlene Coria RN Entered By: Carlene Coria on 06/08/2019 13:15:07 -------------------------------------------------------------------------------- Pain Assessment Details Patient Name: Date of Service: KELSEE, PRESLAR 06/08/2019 12:45 PM Medical Record [id number] Patient Account Number: [id number] Date of Birth/Sex: Treating RN: [birth year] (73 y.o. [name] Primary Care Othella Slappey: Chrisandra Netters Other Clinician: Referring Christy Friede: Treating Ernan Runkles/Extender:Robson, Nonda Lou, Ivan Anchors in Treatment: 48 Active Problems Location of Pain Severity and Description of Pain Patient Has Paino No Site Locations Pain Management and Medication Current Pain Management: Electronic Signature(s) Signed: 07/27/2019 3:01:15 PM By: Carlene Coria RN Signed: 07/27/2019 3:02:15 PM By: Sandre Kitty Entered By: Sandre Kitty on 06/08/2019 13:15:58 -------------------------------------------------------------------------------- Patient/Caregiver Education Details Patient Name: Date of Service: [name] 10/20/2020andnbsp12:45 PM Medical Record Patient Account Number: [id number] Number: Treating RN: Carlene Coria Date of Birth/Gender: June 10, 1946 (73 y.o. F) Other Clinician: Primary Care Physician: Chrisandra Netters Treating Linton Ham Referring Physician: Physician/Extender: Tomasa Blase in Treatment: 59 Education Assessment Education Provided To: Patient Education Topics Provided Wound/Skin Impairment: Methods: Explain/Verbal Responses: State content correctly Electronic Signature(s) Signed: 07/27/2019 3:01:15 PM By: Carlene Coria RN Entered By: Carlene Coria on 06/08/2019  13:15:23 -------------------------------------------------------------------------------- Wound Assessment Details Patient Name: Date of Service: BATYA, CITRON 06/08/2019 12:45 PM Medical Record [id number] Patient Account Number: [id number] Date of Birth/Sex: Treating RN: 1945/12/17 (73 y.o. F)  Carlene Coria Primary Care Vivi Piccirilli: Chrisandra Netters Other Clinician: Referring Jeren Dufrane: Treating Evanny Ellerbe/Extender:Robson, Nonda Lou, Ivan Anchors in Treatment: 48 Wound Status Wound Number: [id number] Primary Diabetic Wound/Ulcer of the Lower Etiology: Extremity Wound Location: Left Foot - Plantar Wound Open Wounding Event: Gradually Appeared Status: Date Acquired: 08/07/2018 Comorbid Cataracts, Hypertension, Type II Diabetes, Weeks Of Treatment: 42 History: Rheumatoid Arthritis Clustered Wound: Yes Photos Wound Measurements Length: (cm) 0.3 Width: (cm) 0.7 Depth: (cm) 0.1 Clustered Quantity: 2 Area: (cm) 0.165 Volume: (cm) 0.016 Wound Description Classification: Grade 2 Wound Margin: Flat and Intact Exudate Amount: Medium Exudate Type: Serosanguineous Exudate Color: red, brown Wound Bed Granulation Amount: Large (67-100%) Granulation Quality: Red, Pink Necrotic Amount: None Present (0%) fter Cleansing: No ino No Exposed Structure ed: No ubcutaneous Tissue) Exposed: Yes ed: No ed: No d: No : No % Reduction in Area: 30.1% % Reduction in Volume: 66% Epithelialization: Large (67-100%) Tunneling: No Undermining: No Foul Odor A Slough/Fibr Fascia Expos Fat Layer (S Tendon Expos Muscle Expos Joint Expose Bone Exposed Electronic Signature(s) Signed: 06/10/2019 4:25:07 PM By: Mikeal Hawthorne EMT/HBOT Signed: 07/27/2019 3:01:15 PM By: Carlene Coria RN Entered By: Mikeal Hawthorne on 06/09/2019 11:40:03 -------------------------------------------------------------------------------- Vitals Details Patient Name: Date of Service: [name].  06/08/2019 12:45 PM Medical Record [id number] Patient Account Number: [id number] Date of Birth/Sex: Treating RN: [birth year] (73 y.o. [name] Primary Care Maysin Carstens: Chrisandra Netters Other Clinician: Referring Maryssa Giampietro: Treating Wavie Hashimi/Extender:Robson, Nonda Lou, Ivan Anchors in Treatment: 48 Vital Signs Time Taken: 13:14 Temperature (F): 97.8 Height (in): 62 Pulse (bpm): 80 Weight (lbs): 140 Respiratory Rate (breaths/min): 17 Body Mass Index (BMI): 25.6 Blood Pressure (mmHg): 166/72 Reference Range: 80 - 120 mg / dl Electronic Signature(s) Signed: 07/27/2019 3:02:15 PM By: Sandre Kitty Entered By: Sandre Kitty on 06/08/2019 13:15:48

## 2019-07-27 NOTE — Progress Notes (Signed)
Kari, Keller ([id number]) Visit Report for 06/08/2019 Debridement Details Patient Name: Date of Service: Kari, Keller 06/08/2019 12:45 PM Medical Record [id number] Patient Account Number: [id number] Date of Birth/Sex: [birth year] (73 y.o. F) Treating RN: Carlene Coria Primary Care Provider: Chrisandra Netters Other Clinician: Referring Provider: Treating Provider/Extender:Emilly Lavey, Nonda Lou, Ivan Anchors in Treatment: 48 Debridement Performed for Wound #19 Left,Plantar Foot Assessment: Performed By: Physician Ricard Dillon., MD Debridement Type: Debridement Severity of Tissue Pre Fat layer exposed Debridement: Level of Consciousness (Pre- Awake and Alert procedure): Pre-procedure Verification/Time Out Taken: Yes - 14:10 Start Time: 14:10 Pain Control: Lidocaine 5% topical ointment Total Area Debrided (L x W): 0.3 (cm) x 0.7 (cm) = 0.21 (cm) Tissue and other material Viable, Non-Viable, Skin: Dermis , Skin: Epidermis debrided: Level: Skin/Epidermis Debridement Description: Selective/Open Wound Instrument: Curette Bleeding: Moderate Hemostasis Achieved: Pressure End Time: 14:14 Procedural Pain: 0 Post Procedural Pain: 0 Response to Treatment: Procedure was tolerated well Level of Consciousness Awake and Alert (Post-procedure): Post Debridement Measurements of Total Wound Length: (cm) 0.3 Width: (cm) 0.7 Depth: (cm) 0.1 Volume: (cm) 0.016 Character of Wound/Ulcer Post Improved Debridement: Severity of Tissue Post Debridement: Fat layer exposed Post Procedure Diagnosis Same as Pre-procedure Electronic Signature(s) Signed: 06/08/2019 6:17:56 PM By: Linton Ham MD Signed: 07/27/2019 3:01:15 PM By: Carlene Coria RN Entered By: Linton Ham on 06/08/2019 15:00:27 -------------------------------------------------------------------------------- HPI Details Patient Name: Date of Service: [name] 06/08/2019 12:45 PM Medical Record  [id number] Patient Account Number: [id number] Date of Birth/Sex: Treating RN: Jul 11, 1946 (73 y.o. [name] Primary Care Provider: Chrisandra Netters Other Clinician: Referring Provider: Treating Provider/Extender:Jenella Craigie, Nonda Lou, Ivan Anchors in Treatment: 41 History of Present Illness HPI Description: 73 year old patient who is known to our practice from May of last year was fully worked up with a venous and arterial duplex study and was referred to the vascular surgeon for follow-up. The patient says she has seen the vascular surgeons and has an appointment back in April. No procedure was done. She has developed a blister on the left big toe and forefoot which has been fairly large and draining fluid and she self-referred herself to [place]. The patient was recently seen in the ER on 10/06/2016, and was treated with clindamycin and asked to see the podiatrist and PCP. She saw the podiatrist Dr. Felisa Bonier on 10/11/2016 who I understand did an x-ray but no report is available. Of note last year when I saw her, I had referred her to do Dr. Ruta Hinds -- he saw on 02/04/2016. he reviewed and noted that she had evidence of peripheral arterial disease and recommended a follow-up in 6 months for repeat noninvasive arterial exam. Wounds worsened he would consider an angiogram. He saw her back on 03/14/2016, and continued conservative treatment and asked her to come back for noninvasive studies in 6 months. 12/03/2016 -- was seen in the office on 11/28/2016 and note is made of the fact that venous duplex examination on 01/02/2016 showed no evidence of reflux. Did have evidence of peripheral arterial disease and was asked to follow- up in 6 months time. Recent data on 11/28/2016 showed right noncompressible with monophasic waveforms left noncompressible with monophasic waveforms. The thought was she has medial calcifications of the arteries and had bilateral toe brachial indices  which were normal. Right toe brachial index was 0.74 and the left toe brachial index was 0.80 She would return for a vascular study in 6 months when her ABI would be checked again. 7/6-Patient is back at  1 week visit to the clinic, home health is attending to the wound once a week, we have been using silver alginate, MRI scheduled this week on Thursday for the left foot. We are seeing her for the left foot plantar ulcer and the right great toe ulcer 12/24/2016 -- the left plantar heel had a large callus which came off during her evaluation and there was an open ulcer at the base ==== Old notes She was recently seen by her PCP for an ulcer of the left toe which was treated at the urgent care by giving her ciprofloxacin which she has been taking. She has uncontrolled diabetes and A1c was 13.7 done last week. Past medical history significant for poorly controlled diabetes mellitus type 2, obesity, osteopenia, GERD, diabetic retinopathy, diabetic neuropathy, reported arthritis, hypothyroidism, hypertension. No x-rays were done recently. 12/27/2015 -- had an x-ray of the left foot --IMPRESSION:Juxta-articular erosions at the IP joint great toe and diffusely in the MTP joints greatest at first, suggesting an inflammatory arthropathy such as rheumatoid arthritis or gout.While septic arthritis can cause juxta-articular erosions, the multiplicity of joints involved makes this less likely. Significant soft tissue swelling with single tiny questionable focus of soft tissue gas medial to the first metatarsal head. If patient has persistent symptoms or persistent clinical concern for osteomyelitis, recommend MR imaging of the LEFT foot (with and without contrast if renal function permits). 01/03/2016 -- the lower extremity venous duplex reflex evaluation showed no evidence of deep or superficial thrombus or reflux. The arterial study done and showed the resting right ABI is greater than 1.3 and the left  ABI was greater than 1.3 indicating arterial medial calcification. The right TBI was less than 0.64 and the left ABI was less than 0.64 both of which are abnormal. except for the left posterior tibial which is triphasic on the flow is biphasic. With the above results due to the fact that she has diabetes mellitus and would recommend she sees the vascular surgeon to see if any further intervention or procedure needs to be planned. ====== READMISSION 09/19/17;this is a 73 year old woman that we've had in our clinic previously for wounds on predominantly her left foot left toe and left heel. She is a diabetic. She has had lower extremity workups for both venous reflux disease and arterial disease. She has known noncompressible vessels bilaterally however in May 2017 as noted above her TBI was 0.64 and the left TBI was less than 0.64. When she was last in clinicin May 2017 the left heel wound had closed. She was faithfully wearing her juxta light stockings. She tells me 2-3 weeks ago she developed increasing edema in her legs. She was seen by Dr. Valentino Saxon of nephrology who apparently increased her Lasix from 40-80 mg twice a day. The patient has not noticed any improvement in the edema. She does not weigh herself. About a week ago she noted blisters on her legs on the right 2 on the left 1 since then she has not been wearing the juxta light stockings. She has not had any pain no shortness of breath 09/26/17;; the patient has a stable wound as a result of blistering on the left anterior calf and 2 on the right anterior and right medial. There is another denuded blister on the left anterior more superior. This does not have any current fluid I did not remove the skin today. We've been using silver alginate under compression 10/03/17; the patient's right leg is healed. The area on the left  anterior leg all wound sites look better. We've been using silver alginate under compression. In questioning the  patient apparently has a juxta light stockings however she has not been using these. We asked her to find them lubricate her skin every night and used a juxta light stocking on the right. I would like to see this next time 10/10/17; both the patient's areas on the left anterior leg 2 are closed the area on the look right posterior medial calf remains closed. She is been using a juxta light stocking on the right she has one for the left Readmission Mrs. Joya Gaskins is now a 73 year old woman we have had in this clinic at least 3 times before. Most recently she was here in February 2019 for 2 visits with wounds on her bilateral lower extremities because of blisters these healed fairly quickly and we discharged her in juxta lite stockings. She was previously here in 2018 with a wound on her left foot and in 2017 she was sent to see Dr. Oneida Alar for follow-up of PAD. Indeed she seems to have had follow-up noninvasive vascular studies every 6 months. Her current problems began at the end of October she apparently suffered a fall with a laceration on her right anterior leg. She had this sutured on 06/23/2018. Sometime in the same timeframe she developed blistering over a large area of the left anterior calf, the right great toe. The blisters have spontaneously ruptured and she has large areas of the epithelial loss. She also has non-open blisters on the medial part of the left great toe. There is also an open area on the right second toe. I do not believe that she was using her compression stockings [not juxta lites] because the swelling in her legs had gotten too large for her to wrap. She tells me she is recently been to her nephrologist and had her Lasix increased which is helped somewhat with the swelling. The patient did not have arterial studies done in our clinic. Her most recent noninvasive studies were in August. These showed ABIs noncompressible bilaterally. She had TBI's on the right at 0.54 and on  the left at 0.60 biphasic waveforms at the PTA and DP bilaterally. She was felt to have bilateral ABIs that were unchanged from her previous study in October 2018. However it was noted her TBI's were decreased. Past medical history includes type 2 diabetes with peripheral neuropathy with a recent hemoglobin A1c of 7. She is apparently off her diabetes medications, she has stage III chronic renal failure, hyperlipidemia, hypertension, cataracts, retinopathy, rheumatoid arthritis, iron deficiency and apparently is receiving IV iron, urge incontinence 07/10/2018; she arrives today with everything looking quite a bit better. The large area of blistered denuded epithelium on the left calf has healed over. The area on the right lateral tibial area is also mostly healed still with a linear open area where her laceration was. The area on her left medial toe is just about healed. She has a dark thick black eschar over the tip of her right first toe. I do not remember this from last week this feels almost like the eschar you would see with ischemia yet her toes are warm and her peripheral pulses are palpable. She does have some degree of PAD but I do not think that was sufficient enough to have caused this. 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 Patient originally came here with a laceration injury of her right lateral mid  calf. She also had an area of denuded skin on the left which is since closed over. She had a small area on the tip of her right great toe as well. She arrives today with what looks to be dry gangrene on the right great toe as well as discoloration of the tips of her second and third toes. She is not in any pain. She also had purulent drainage coming out open area on her left midfoot which was new this week. Culture was obtained The patient is known to vascular surgery having last been seen on 04/02/2018. They follow her for chronic  arterial insufficiency. Last arterial studies were in August 2017 at which time her ABIs were noncompressible however her TBI was 0.54 on the right and 0.60 on the left. She was noted to have bilateral ankle arteries remain calcified. Waveforms biphasic. Slight decline in bilateral TBI's 07/24/2018; the patient was graciously seen urgently by vein and vascular with regards to critical limb ischemia, she had a CO2 angiogram and then contrast to look at the lower extremity arteries. Surprisingly no significant vascular disease was noted in the aortic iliac set segment, patent common femoral and profunda arteries patent popliteal and three-vessel runoff with all 3 of the tibials present. Her dorsalis pedis and plantar arch vessels also filled. Notable for the fact that it was felt that she had small vessel disease near her toes but this was not amenable for intervention. The abscess that I unroofed last week on the plantar left foot grew Stenotrophomonas Maltophilia. This is not a usual true wound pathogen. However it had abundant organisms and given the fact that is an abscess I went ahead and treated this with Levaquin 250 daily for 7 days. I had her on doxycycline last week 07/31/2018; I put 3 layer compression on the right leg last week. She arrives in today with almost no edema in the right leg and out of concern for the this in the gangrenous toes I am going to reduce this back to Kerlix and Coban which is what we had her on at the beginning. We are using silver alginate to the traumatic wound on her right anterior leg and this looks a lot better. The area on the left foot is healed and the patient is using her own wraparound compression stocking here. She still has the dry gangrene-like changes predominantly involving the right first toe, tip of her right second and third toes. We are using Betadine here. The exact etiology of this was not completely clear 08/07/18; we've been using 2 layer  compression on the right leg. She still has the gangrenous toes for second and third on the right and the original laceration injury. She does not have any significant vascular issues to the level of the ankle. She does have small vessel disease in her toes.The left mid foot wound is closed 08/14/18; the patient arrives in clinic today with improvement in the original area on her right lateral leg. The 3 dry gangrenous toes first second and third on the right all look stable except for the second. This was obviously separating. It was removed fortunately there is healthy-looking surface tissue underneath this. She arrives in clinic with a reopening on the left midfoot. This was previously a small abscess that had closed. She has Charcot feet bilaterally 08/24/2018; right lateral leg wound is smaller. The 3 dry gangrenous first second and third toes are a bit changed. The third toe was separating I remove the eschar and  subcutaneous tissue to reveal a small open area at the tip of the toe just at the base at the head of the nail. The first toe is unchanged. Left plantar foot is larger. This is in the Charcot foot area. I removed nonviable circumference and surface from the wound using silver alginate 08/31/2018; right lateral leg wound continues to contract also the area on her plantar foot. First great toe is beginning to separate and the nail will soon I think fall off.. She sees vein and vascular tomorrow. 1/20; right lateral leg wound continues to contract and is almost closed first great toe is beginning to separate. The area on her left plantar foot still has rolled edges around the wound and some debris in the surface but generally smaller. We have been using silver alginate to the 2 remaining wound areas and Betadine to the left great toe. The patient saw vein and vascular on 1/14. They did not add anything here. Noted that the patient has microvascular disease but no major macrovascular  issues. No intervention was performed during her CO2 aortogram on 07/23/2018. 2/3; 2-week follow-up. Right leg wound which is her original wound and coming here is healed. Second and third right toes have healed. She still has dry eschared/dry gangrene over the right first toe. I thought this might begin to separate but it really is not making great headway with that. The area on the left midfoot reopened 2 to 3 weeks ago and this is made no progress. Finally she has new blisters on the left first toe laterally and the left second toe dorsally over the DIP at the base of the nail 2/17; 2-week follow-up. Right leg wound which is her original wound is healed. Right second and third toes are still healed. She still has the black ischemic eschar over the first toe which has not really begun to separate. The area on the left midfoot is unfortunately worse with large amounts of denuded skin around this. She has blisters on the medial part of her first toe and the dorsal part of the second toe on the left 2/24; she still has the black ischemic eschar over the first toe which is not really separating. The area on the left midfoot seems stable from last week. The blisters on the medial part of her first toe and dorsal second toe had hardened and I removed both of these there are small open areas underneath. 3/2; left first and second toes are healed. The area on the left plantar foot looks better. Finally the right great toe area is beginning to separate. 3/9; left first and second toes remain healed. Left plantar foot continues to look excellent smaller with a healthy base. Finally the right great toe had separated further. I went ahead and remove the eschar here. There is still a fairly sizable wound here but this will give [place] a better chance to address this. 3/16-Left first and second toes mostly healed although the left first toe appears to have an open area Right great toe deep ulcer with exposed bone,  greenish debris with necrotic tissue, right second toe wound with clean edges and base. Patient had silver alginate dressing to all the toes especially on the right. SHe has been offloading with inserts and open toe shoes 3/23; since I last saw this patient 2 weeks ago there is been quite a bit of deterioration. The left plantar foot continues to look about the same. She now has an area on the left lateral  first toe. On the right foot the first toe has a fair amount of exposed bone. X-ray that we did last week did not show osteomyelitis nevertheless that would have to be a concern. She also has an area on the tip of the right second toe that is new 3/30; some improvement in the left plantar foot. Bone I took out of the right first toe last week did not show osteomyelitis under pathology however culture did grow rare staph aureus and rare Citrobacter. We have been using silver alginate to all her wounds 4/6; left plantar foot wound about the same although this does not look ominous. The right first toe which is been very problematic does not have exposed bone aggressive debridement of the surface of this. She does have grams of epithelialization. We have been using silver alginate on both wounds 4/13; left plantar foot wound about the same. Removed skin and subcutaneous tissue from the wound circumference. The right great toe has underlying osteomyelitis I have extended her Levaquin 500 mg for another 2 weeks. She does not complain of any overt side effects. 4/23;. The patient has wounds on the left plantar foot, left lateral great toe and a new area on the dorsal part of the right second toe. Her large area over the tip of her right great toe. She is on Levaquin I will need to check on when we want to finish this. This is for underlying osteomyelitis of the right great toe 5/4; the patient has or should be completing her Levaquin at this point. This is for osteomyelitis in the right great toe.  She also has wounds on her left plantar foot and the dorsal part of her right second toe. We have been using silver alginate 5/11; patient arrives today with her right great toe generally looking better. However the area on the plantar left foot had considerable undermining and discomfort around the wound and some erythema. We have been using silver alginate 5/18-Patient returns at 1 week the right great toe is much more macerated around the edges of the wound, the left foot wounds are considerably better especially the left great toe and left second toe. We have been using silver alginate to all wounds and juxta lite to the right leg and 2 layer compression to the left. THere is now a fluid filled bleb on the posterior calf of RLE 5/26; right great toe continues to improve. The underlying osteomyelitis was treated empirically in this clinic. Left midfoot wound also looks better than the last time I saw this 2 weeks ago. Apparently last week she had a blister on the right posterior calf that was largely. This was not specifically addressed. It is open since last time she has a large area on the right posterior mid calf with skin attached to a large amount of the circumference. 6/1; we continue to have true improvements in both of the wound areas on the right great toe and left plantar foot. The large blister on the posterior right calf is just about fully epithelialized although there is still some weeping edema in this area 6/8; we continued to have been improvements in both areas currently on the right great toe and left plantar foot. The large blister on the posterior right calf is fully healed. 6/15; we we continue to have improvements in the right great toe. The left plantar foot which was a small wound without undermining last week has decided to expand into the lateral foot. There is some swelling  in this area. She is not systemically unwell 6/22; the culture of her left foot that I did  last week grew Enterobacter I had her on doxycycline. There was also clearly some purulent drainage coming from this today through the original wound with the swelling on the lateral part of her foot. She is not systemically unwell. 6/25 the patient is on Levaquin for the Enterobacter cultured out of her left foot. She now has an extensive wound area in the medial left foot we are using silver alginate The patient complains of pain in the foot going up towards the metatarsal head. There is also swelling inferiorly to this area that we identified last week but this is not tender 6/29; the patient is completing her Levaquin for the Enterobacter. We are using silver alginate to the extensive wound on the left plantar foot as well as to the right great toe 7/14; the right great toe is healed today. The area on the left foot looks better. The MRI of the left foot did not show osteomyelitis in the area of the wound however it did show an effusion in the tibiotalar joint. The patient has rheumatoid arthritis 7/20; right great toe remains healed. The area on the left foot continues to look improved. 7/27 right great toe remains healed. The area on the left foot continues to contract. Healthy granulation tissue she is only offloading this in surgical shoes. 03/24/2019 on evaluation today patient appears to be doing very well with regard to her plantar foot ulcer. She has been tolerating the dressing changes without complication. She has a healthy granulation surface. There is no signs of active infection at this time and her swelling seems to be under good control. 8/17; the areas on the left medial midfoot. Probably subluxed bone in this area. Wounds are superficial. It looks as though there was some blistering skin at one point 8/24; the area on the left medial midfoot. This is completely resolved at this point although it still looks somewhat vulnerable. She probably has subluxed bone underneath this  area. She has pes planus deformity 9/14; apparently this wound reopened in the 3 weeks since I saw her. She was seen last week she is given new healing sandals. Been using calcium alginate with felt offloading. This is not surprising given the area looks somewhat vulnerable. 9/28; left plantar foot in the setting of diabetic neuropathy probably some degree of Charcot deformity. She also has abrasions on the left leg which she says are due to the Curlex co-band that was put on by home health. She is also having a lot of pain in the left calf finds the wrap too tight 10/5; left plantar foot has 2 superficial open areas. The abrasions on the left leg from last week are healed. She still has some swelling and tenderness however. Duplex ultrasound of the leg tomorrow tomorrow. 10/13; patient arrived with a deterioration in the left foot from last week. Undermining wound probably connecting the 2 superficial areas from last week. She wears a surgical shoe with felt offloading. Duplex ultrasound she had last week showed a ruptured Baker's cyst. Her leg looks a lot better 10/20; 2 remanent open areas in the left foot. This looks better than last time. We are using silver alginate. She wears a surgical shoe for offloading there is not a more aggressive option Electronic Signature(s) Signed: 06/08/2019 6:17:56 PM By: Linton Ham MD Entered By: Linton Ham on 06/08/2019 15:01:08 -------------------------------------------------------------------------------- Physical Exam Details Patient Name: Date of  Service: KEANNA, TUGWELL 06/08/2019 12:45 PM Medical Record [id number] Patient Account Number: [id number] Date of Birth/Sex: Treating RN: [birth year] (73 y.o. [name] Primary Care Provider: Chrisandra Netters Other Clinician: Referring Provider: Treating Provider/Extender:Carles Florea, Nonda Lou, Ivan Anchors in Treatment: 45 Constitutional Patient is hypertensive.. Pulse  regular and within target range for patient.Marland Kitchen Respirations regular, non-labored and within target range.. Temperature is normal and within the target range for the patient.Marland Kitchen Appears in no distress. Notes Wound exam; there is no open wound in the right leg or foot. The left leg is no open areas. She still has the original area on the lateral part of the left foot 2 superficial open areas here once part of a connecting wound. This looks better there is no evidence of infection I removed some callus and dry flaking skin from the wound area. In general this looks better there is no evidence of infection Electronic Signature(s) Signed: 06/08/2019 6:17:56 PM By: Linton Ham MD Entered By: Linton Ham on 06/08/2019 15:07:47 -------------------------------------------------------------------------------- Physician Orders Details Patient Name: Date of Service: [name] 06/08/2019 12:45 PM Medical Record [id number] Patient Account Number: [id number] Date of Birth/Sex: Treating RN: 1946-01-22 (73 y.o. [name] Primary Care Provider: Chrisandra Netters Other Clinician: Referring Provider: Treating Provider/Extender:Caeden Foots, Nonda Lou, Ivan Anchors in Treatment: 71 Verbal / Phone Orders: No Diagnosis Coding ICD-10 Coding Code Description E11.42 Type 2 diabetes mellitus with diabetic polyneuropathy L97.518 Non-pressure chronic ulcer of other part of right foot with other specified severity L97.521 Non-pressure chronic ulcer of other part of left foot limited to breakdown of skin L02.612 Cutaneous abscess of left foot S80.812D Abrasion, left lower leg, subsequent encounter Follow-up Appointments Wound #19 Left,Plantar Foot Return Appointment in 1 week. Dressing Change Frequency Wound #19 Left,Plantar Foot Other: - Change 2 times per week Skin Barriers/Peri-Wound Care Wound #19 Left,Plantar Foot Moisturizing lotion - to both legs Wound Cleansing Wound #19  Left,Plantar Foot Clean wound with Wound Cleanser Primary Wound Dressing Wound #19 Left,Plantar Foot Calcium Alginate Secondary Dressing Wound #19 Left,Plantar Foot Foam Dry Gauze Edema Control Wound #19 Left,Plantar Foot Kerlix and Coban - Left Lower Extremity Avoid standing for long periods of time Elevate legs to the level of the heart or above for 30 minutes daily and/or when sitting, a frequency of: - throughout the day Support Garment 20-30 mm/Hg pressure to: - juxtalite to right leg Off-Loading Open toe surgical shoe to: - left foot with felt. [facility] care. Lajean Manes Electronic Signature(s) Signed: 06/08/2019 6:17:56 PM By: Linton Ham MD Signed: 07/27/2019 3:01:15 PM By: Carlene Coria RN Entered By: Carlene Coria on 06/08/2019 13:14:53 -------------------------------------------------------------------------------- Problem List Details Patient Name: Date of Service: [name] 06/08/2019 12:45 PM Medical Record [id number] Patient Account Number: [id number] Date of Birth/Sex: Treating RN: 06-08-1946 (73 y.o. [name] Primary Care Provider: Chrisandra Netters Other Clinician: Referring Provider: Treating Provider/Extender:Einar Nolasco, Nonda Lou, Ivan Anchors in Treatment: 48 Active Problems ICD-10 Evaluated Encounter Code Description Active Date Today Diagnosis E11.42 Type 2 diabetes mellitus with diabetic polyneuropathy 07/03/2018 No Yes L97.518 Non-pressure chronic ulcer of other part of right foot 07/20/2018 No Yes with other specified severity L97.521 Non-pressure chronic ulcer of other part of left foot 08/15/2018 No Yes limited to breakdown of skin L02.612 Cutaneous abscess of left foot 07/20/2018 No Yes S80.812D Abrasion, left lower leg, subsequent encounter 05/17/2019 No Yes Inactive Problems ICD-10 Code Description Active Date Inactive Date L97.511 Non-pressure chronic ulcer of  other part  of right foot limited to 07/03/2018 07/03/2018 breakdown of skin E11.52 Type 2 diabetes mellitus with diabetic peripheral angiopathy 07/20/2018 07/20/2018 with gangrene Resolved Problems ICD-10 Code Description Active Date Resolved Date S81.811D Laceration without foreign body, right lower leg, subsequent 07/03/2018 07/03/2018 encounter L97.821 Non-pressure chronic ulcer of other part of left lower leg limited 07/03/2018 07/03/2018 to breakdown of skin L97.211 Non-pressure chronic ulcer of right calf limited to breakdown of 01/12/2019 01/12/2019 skin Electronic Signature(s) Signed: 06/08/2019 6:17:56 PM By: Linton Ham MD Entered By: Linton Ham on 06/08/2019 14:59:59 -------------------------------------------------------------------------------- Progress Note Details Patient Name: Date of Service: [name] 06/08/2019 12:45 PM Medical Record [id number] Patient Account Number: [id number] Date of Birth/Sex: Treating RN: 1946/06/29 (73 y.o. [name] Primary Care Provider: Chrisandra Netters Other Clinician: Referring Provider: Treating Provider/Extender:Toby Breithaupt, Nonda Lou, Ivan Anchors in Treatment: 48 Subjective History of Present Illness (HPI) 73 year old patient who is known to our practice from May of last year was fully worked up with a venous and arterial duplex study and was referred to the vascular surgeon for follow-up. The patient says she has seen the vascular surgeons and has an appointment back in April. No procedure was done. She has developed a blister on the left big toe and forefoot which has been fairly large and draining fluid and she self-referred herself to [place]. The patient was recently seen in the ER on 10/06/2016, and was treated with clindamycin and asked to see the podiatrist and PCP. She saw the podiatrist Dr. Felisa Bonier on 10/11/2016 who I understand did an x-ray but no report is available. Of note last year when I  saw her, I had referred her to do Dr. Ruta Hinds -- he saw on 02/04/2016. he reviewed and noted that she had evidence of peripheral arterial disease and recommended a follow-up in 6 months for repeat noninvasive arterial exam. Wounds worsened he would consider an angiogram. He saw her back on 03/14/2016, and continued conservative treatment and asked her to come back for noninvasive studies in 6 months. 12/03/2016 -- was seen in the office on 11/28/2016 and note is made of the fact that venous duplex examination on 01/02/2016 showed no evidence of reflux. Did have evidence of peripheral arterial disease and was asked to follow- up in 6 months time. Recent data on 11/28/2016 showed right noncompressible with monophasic waveforms left noncompressible with monophasic waveforms. The thought was she has medial calcifications of the arteries and had bilateral toe brachial indices which were normal. Right toe brachial index was 0.74 and the left toe brachial index was 0.80 She would return for a vascular study in 6 months when her ABI would be checked again. 7/6-Patient is back at 1 week visit to the clinic, home health is attending to the wound once a week, we have been using silver alginate, MRI scheduled this week on Thursday for the left foot. We are seeing her for the left foot plantar ulcer and the right great toe ulcer 12/24/2016 -- the left plantar heel had a large callus which came off during her evaluation and there was an open ulcer at the base ==== Old notes She was recently seen by her PCP for an ulcer of the left toe which was treated at the urgent care by giving her ciprofloxacin which she has been taking. She has uncontrolled diabetes and A1c was 13.7 done last week. Past medical history significant for poorly controlled diabetes mellitus type 2, obesity, osteopenia, GERD, diabetic retinopathy, diabetic neuropathy, reported arthritis, hypothyroidism, hypertension. No  x-rays were  done recently. 12/27/2015 -- had an x-ray of the left foot --IMPRESSION:Juxta-articular erosions at the IP joint great toe and diffusely in the MTP joints greatest at first, suggesting an inflammatory arthropathy such as rheumatoid arthritis or gout.While septic arthritis can cause juxta-articular erosions, the multiplicity of joints involved makes this less likely. Significant soft tissue swelling with single tiny questionable focus of soft tissue gas medial to the first metatarsal head. If patient has persistent symptoms or persistent clinical concern for osteomyelitis, recommend MR imaging of the LEFT foot (with and without contrast if renal function permits). 01/03/2016 -- the lower extremity venous duplex reflex evaluation showed no evidence of deep or superficial thrombus or reflux. The arterial study done and showed the resting right ABI is greater than 1.3 and the left ABI was greater than 1.3 indicating arterial medial calcification. The right TBI was less than 0.64 and the left ABI was less than 0.64 both of which are abnormal. except for the left posterior tibial which is triphasic on the flow is biphasic. With the above results due to the fact that she has diabetes mellitus and would recommend she sees the vascular surgeon to see if any further intervention or procedure needs to be planned. ====== READMISSION 09/19/17;this is a 73 year old woman that we've had in our clinic previously for wounds on predominantly her left foot left toe and left heel. She is a diabetic. She has had lower extremity workups for both venous reflux disease and arterial disease. She has known noncompressible vessels bilaterally however in May 2017 as noted above her TBI was 0.64 and the left TBI was less than 0.64. When she was last in clinicin May 2017 the left heel wound had closed. She was faithfully wearing her juxta light stockings. She tells me 2-3 weeks ago she developed increasing edema in her legs.  She was seen by Dr. Valentino Saxon of nephrology who apparently increased her Lasix from 40-80 mg twice a day. The patient has not noticed any improvement in the edema. She does not weigh herself. About a week ago she noted blisters on her legs on the right o2 on the left o1 since then she has not been wearing the juxta light stockings. She has not had any pain no shortness of breath 09/26/17;; the patient has a stable wound as a result of blistering on the left anterior calf and 2 on the right anterior and right medial. There is another denuded blister on the left anterior more superior. This does not have any current fluid I did not remove the skin today. We've been using silver alginate under compression 10/03/17; the patient's right leg is healed. The area on the left anterior leg all wound sites look better. We've been using silver alginate under compression. In questioning the patient apparently has a juxta light stockings however she has not been using these. We asked her to find them lubricate her skin every night and used a juxta light stocking on the right. I would like to see this next time 10/10/17; both the patient's areas on the left anterior leg o2 are closed the area on the look right posterior medial calf remains closed. She is been using a juxta light stocking on the right she has one for the left Readmission Mrs. Joya Gaskins is now a 73 year old woman we have had in this clinic at least 3 times before. Most recently she was here in February 2019 for 2 visits with wounds on her bilateral lower extremities because of  blisters these healed fairly quickly and we discharged her in juxta lite stockings. She was previously here in 2018 with a wound on her left foot and in 2017 she was sent to see Dr. Oneida Alar for follow-up of PAD. Indeed she seems to have had follow-up noninvasive vascular studies every 6 months. Her current problems began at the end of October she apparently suffered a fall with  a laceration on her right anterior leg. She had this sutured on 06/23/2018. Sometime in the same timeframe she developed blistering over a large area of the left anterior calf, the right great toe. The blisters have spontaneously ruptured and she has large areas of the epithelial loss. She also has non-open blisters on the medial part of the left great toe. There is also an open area on the right second toe. I do not believe that she was using her compression stockings [not juxta lites] because the swelling in her legs had gotten too large for her to wrap. She tells me she is recently been to her nephrologist and had her Lasix increased which is helped somewhat with the swelling. The patient did not have arterial studies done in our clinic. Her most recent noninvasive studies were in August. These showed ABIs noncompressible bilaterally. She had TBI's on the right at 0.54 and on the left at 0.60 biphasic waveforms at the PTA and DP bilaterally. She was felt to have bilateral ABIs that were unchanged from her previous study in October 2018. However it was noted her TBI's were decreased. Past medical history includes type 2 diabetes with peripheral neuropathy with a recent hemoglobin A1c of 7. She is apparently off her diabetes medications, she has stage III chronic renal failure, hyperlipidemia, hypertension, cataracts, retinopathy, rheumatoid arthritis, iron deficiency and apparently is receiving IV iron, urge incontinence 07/10/2018; she arrives today with everything looking quite a bit better. The large area of blistered denuded epithelium on the left calf has healed over. The area on the right lateral tibial area is also mostly healed still with a linear open area where her laceration was. The area on her left medial toe is just about healed. She has a dark thick black eschar over the tip of her right first toe. I do not remember this from last week this feels almost like the eschar you would see  with ischemia yet her toes are warm and her peripheral pulses are palpable. She does have some degree of PAD but I do not think that was sufficient enough to have caused this. 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 Patient originally came here with a laceration injury of her right lateral mid calf. She also had an area of denuded skin on the left which is since closed over. She had a small area on the tip of her right great toe as well. She arrives today with what looks to be dry gangrene on the right great toe as well as discoloration of the tips of her second and third toes. She is not in any pain. She also had purulent drainage coming out open area on her left midfoot which was new this week. Culture was obtained The patient is known to vascular surgery having last been seen on 04/02/2018. They follow her for chronic arterial insufficiency. Last arterial studies were in August 2017 at which time her ABIs were noncompressible however her TBI was 0.54 on the right and 0.60 on the left. She was noted to have  bilateral ankle arteries remain calcified. Waveforms biphasic. Slight decline in bilateral TBI's 07/24/2018; the patient was graciously seen urgently by vein and vascular with regards to critical limb ischemia, she had a CO2 angiogram and then contrast to look at the lower extremity arteries. Surprisingly no significant vascular disease was noted in the aortic iliac set segment, patent common femoral and profunda arteries patent popliteal and three-vessel runoff with all 3 of the tibials present. Her dorsalis pedis and plantar arch vessels also filled. Notable for the fact that it was felt that she had small vessel disease near her toes but this was not amenable for intervention. The abscess that I unroofed last week on the plantar left foot grew Stenotrophomonas Maltophilia. This is not a usual true wound pathogen. However it had abundant organisms  and given the fact that is an abscess I went ahead and treated this with Levaquin 250 daily for 7 days. I had her on doxycycline last week 07/31/2018; I put 3 layer compression on the right leg last week. She arrives in today with almost no edema in the right leg and out of concern for the this in the gangrenous toes I am going to reduce this back to Kerlix and Coban which is what we had her on at the beginning. We are using silver alginate to the traumatic wound on her right anterior leg and this looks a lot better. The area on the left foot is healed and the patient is using her own wraparound compression stocking here. She still has the dry gangrene-like changes predominantly involving the right first toe, tip of her right second and third toes. We are using Betadine here. The exact etiology of this was not completely clear 08/07/18; we've been using 2 layer compression on the right leg. She still has the gangrenous toes for second and third on the right and the original laceration injury. She does not have any significant vascular issues to the level of the ankle. She does have small vessel disease in her toes.The left mid foot wound is closed 08/14/18; the patient arrives in clinic today with improvement in the original area on her right lateral leg. The 3 dry gangrenous toes first second and third on the right all look stable except for the second. This was obviously separating. It was removed fortunately there is healthy-looking surface tissue underneath this. ooShe arrives in clinic with a reopening on the left midfoot. This was previously a small abscess that had closed. She has Charcot feet bilaterally 08/24/2018; right lateral leg wound is smaller. The 3 dry gangrenous first second and third toes are a bit changed. The third toe was separating I remove the eschar and subcutaneous tissue to reveal a small open area at the tip of the toe just at the base at the head of the nail. The first  toe is unchanged. ooLeft plantar foot is larger. This is in the Charcot foot area. I removed nonviable circumference and surface from the wound using silver alginate 08/31/2018; right lateral leg wound continues to contract also the area on her plantar foot. First great toe is beginning to separate and the nail will soon I think fall off.. She sees vein and vascular tomorrow. 1/20; right lateral leg wound continues to contract and is almost closed first great toe is beginning to separate. The area on her left plantar foot still has rolled edges around the wound and some debris in the surface but generally smaller. We have been using silver alginate to  the 2 remaining wound areas and Betadine to the left great toe. The patient saw vein and vascular on 1/14. They did not add anything here. Noted that the patient has microvascular disease but no major macrovascular issues. No intervention was performed during her CO2 aortogram on 07/23/2018. 2/3; 2-week follow-up. Right leg wound which is her original wound and coming here is healed. Second and third right toes have healed. She still has dry eschared/dry gangrene over the right first toe. I thought this might begin to separate but it really is not making great headway with that. The area on the left midfoot reopened 2 to 3 weeks ago and this is made no progress. Finally she has new blisters on the left first toe laterally and the left second toe dorsally over the DIP at the base of the nail 2/17; 2-week follow-up. Right leg wound which is her original wound is healed. Right second and third toes are still healed. She still has the black ischemic eschar over the first toe which has not really begun to separate. The area on the left midfoot is unfortunately worse with large amounts of denuded skin around this. She has blisters on the medial part of her first toe and the dorsal part of the second toe on the left 2/24; she still has the black ischemic  eschar over the first toe which is not really separating. The area on the left midfoot seems stable from last week. The blisters on the medial part of her first toe and dorsal second toe had hardened and I removed both of these there are small open areas underneath. 3/2; left first and second toes are healed. The area on the left plantar foot looks better. Finally the right great toe area is beginning to separate. 3/9; left first and second toes remain healed. Left plantar foot continues to look excellent smaller with a healthy base. Finally the right great toe had separated further. I went ahead and remove the eschar here. There is still a fairly sizable wound here but this will give [place] a better chance to address this. 3/16-Left first and second toes mostly healed although the left first toe appears to have an open area Right great toe deep ulcer with exposed bone, greenish debris with necrotic tissue, right second toe wound with clean edges and base. Patient had silver alginate dressing to all the toes especially on the right. SHe has been offloading with inserts and open toe shoes 3/23; since I last saw this patient 2 weeks ago there is been quite a bit of deterioration. The left plantar foot continues to look about the same. She now has an area on the left lateral first toe. On the right foot the first toe has a fair amount of exposed bone. X-ray that we did last week did not show osteomyelitis nevertheless that would have to be a concern. She also has an area on the tip of the right second toe that is new 3/30; some improvement in the left plantar foot. Bone I took out of the right first toe last week did not show osteomyelitis under pathology however culture did grow rare staph aureus and rare Citrobacter. We have been using silver alginate to all her wounds 4/6; left plantar foot wound about the same although this does not look ominous. The right first toe which is been very problematic  does not have exposed bone aggressive debridement of the surface of this. She does have grams of epithelialization. We have been  using silver alginate on both wounds 4/13; left plantar foot wound about the same. Removed skin and subcutaneous tissue from the wound circumference. ooThe right great toe has underlying osteomyelitis I have extended her Levaquin 500 mg for another 2 weeks. She does not complain of any overt side effects. 4/23;. The patient has wounds on the left plantar foot, left lateral great toe and a new area on the dorsal part of the right second toe. Her large area over the tip of her right great toe. She is on Levaquin I will need to check on when we want to finish this. This is for underlying osteomyelitis of the right great toe 5/4; the patient has or should be completing her Levaquin at this point. This is for osteomyelitis in the right great toe. She also has wounds on her left plantar foot and the dorsal part of her right second toe. We have been using silver alginate 5/11; patient arrives today with her right great toe generally looking better. However the area on the plantar left foot had considerable undermining and discomfort around the wound and some erythema. We have been using silver alginate 5/18-Patient returns at 1 week the right great toe is much more macerated around the edges of the wound, the left foot wounds are considerably better especially the left great toe and left second toe. We have been using silver alginate to all wounds and juxta lite to the right leg and 2 layer compression to the left. THere is now a fluid filled bleb on the posterior calf of RLE 5/26; right great toe continues to improve. The underlying osteomyelitis was treated empirically in this clinic. ooLeft midfoot wound also looks better than the last time I saw this 2 weeks ago. ooApparently last week she had a blister on the right posterior calf that was largely. This was not  specifically addressed. It is open since last time she has a large area on the right posterior mid calf with skin attached to a large amount of the circumference. 6/1; we continue to have true improvements in both of the wound areas on the right great toe and left plantar foot. The large blister on the posterior right calf is just about fully epithelialized although there is still some weeping edema in this area 6/8; we continued to have been improvements in both areas currently on the right great toe and left plantar foot. The large blister on the posterior right calf is fully healed. 6/15; we we continue to have improvements in the right great toe. The left plantar foot which was a small wound without undermining last week has decided to expand into the lateral foot. There is some swelling in this area. She is not systemically unwell 6/22; the culture of her left foot that I did last week grew Enterobacter I had her on doxycycline. There was also clearly some purulent drainage coming from this today through the original wound with the swelling on the lateral part of her foot. She is not systemically unwell. 6/25 the patient is on Levaquin for the Enterobacter cultured out of her left foot. She now has an extensive wound area in the medial left foot we are using silver alginate The patient complains of pain in the foot going up towards the metatarsal head. There is also swelling inferiorly to this area that we identified last week but this is not tender 6/29; the patient is completing her Levaquin for the Enterobacter. We are using silver alginate to the extensive  wound on the left plantar foot as well as to the right great toe 7/14; the right great toe is healed today. The area on the left foot looks better. The MRI of the left foot did not show osteomyelitis in the area of the wound however it did show an effusion in the tibiotalar joint. The patient has rheumatoid arthritis 7/20; right  great toe remains healed. The area on the left foot continues to look improved. 7/27 right great toe remains healed. The area on the left foot continues to contract. Healthy granulation tissue she is only offloading this in surgical shoes. 03/24/2019 on evaluation today patient appears to be doing very well with regard to her plantar foot ulcer. She has been tolerating the dressing changes without complication. She has a healthy granulation surface. There is no signs of active infection at this time and her swelling seems to be under good control. 8/17; the areas on the left medial midfoot. Probably subluxed bone in this area. Wounds are superficial. It looks as though there was some blistering skin at one point 8/24; the area on the left medial midfoot. This is completely resolved at this point although it still looks somewhat vulnerable. She probably has subluxed bone underneath this area. She has pes planus deformity 9/14; apparently this wound reopened in the 3 weeks since I saw her. She was seen last week she is given new healing sandals. Been using calcium alginate with felt offloading. This is not surprising given the area looks somewhat vulnerable. 9/28; left plantar foot in the setting of diabetic neuropathy probably some degree of Charcot deformity. She also has abrasions on the left leg which she says are due to the Curlex co-band that was put on by home health. She is also having a lot of pain in the left calf finds the wrap too tight 10/5; left plantar foot has 2 superficial open areas. The abrasions on the left leg from last week are healed. She still has some swelling and tenderness however. Duplex ultrasound of the leg tomorrow tomorrow. 10/13; patient arrived with a deterioration in the left foot from last week. Undermining wound probably connecting the 2 superficial areas from last week. She wears a surgical shoe with felt offloading. Duplex ultrasound she had last week showed a  ruptured Baker's cyst. Her leg looks a lot better 10/20; 2 remanent open areas in the left foot. This looks better than last time. We are using silver alginate. She wears a surgical shoe for offloading there is not a more aggressive option Objective Constitutional Patient is hypertensive.. Pulse regular and within target range for patient.Marland Kitchen Respirations regular, non-labored and within target range.. Temperature is normal and within the target range for the patient.Marland Kitchen Appears in no distress. Vitals Time Taken: 1:14 PM, Height: 62 in, Weight: 140 lbs, BMI: 25.6, Temperature: 97.8 F, Pulse: 80 bpm, Respiratory Rate: 17 breaths/min, Blood Pressure: 166/72 mmHg. General Notes: Wound exam; there is no open wound in the right leg or foot. The left leg is no open areas. She still has the original area on the lateral part of the left foot 2 superficial open areas here once part of a connecting wound. This looks better there is no evidence of infection I removed some callus and dry flaking skin from the wound area. In general this looks better there is no evidence of infection Integumentary (Hair, Skin) Wound #19 status is Open. Original cause of wound was Gradually Appeared. The wound is located on the [place].  The wound measures 0.3cm length x 0.7cm width x 0.1cm depth; 0.165cm^2 area and 0.016cm^3 volume. There is Fat Layer (Subcutaneous Tissue) Exposed exposed. There is no tunneling or undermining noted. There is a medium amount of serosanguineous drainage noted. The wound margin is flat and intact. There is large (67-100%) red, pink granulation within the wound bed. There is no necrotic tissue within the wound bed. 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 There was a Selective/Open Wound Skin/Epidermis Debridement with a total area of 0.21 sq cm performed by Ricard Dillon., MD. With the following instrument(s): Curette to remove Viable and Non-Viable tissue/material. Material removed includes Skin: Dermis and Skin: Epidermis and after achieving pain control using Lidocaine 5% topical ointment. No specimens were taken. A time out was conducted at 14:10, prior to the start of the procedure. A Moderate amount of bleeding was controlled with Pressure. The procedure was tolerated well with a pain level of 0 throughout and a pain level of 0 following the procedure. Post Debridement Measurements: 0.3cm length x 0.7cm width x 0.1cm depth; 0.016cm^3 volume. Character of Wound/Ulcer Post Debridement is improved. Severity of Tissue Post Debridement is: Fat layer exposed. Post procedure Diagnosis Wound #19: Same as Pre-Procedure Plan Follow-up Appointments: Wound #19 Left,Plantar Foot: Return Appointment in 1 week. Dressing Change Frequency: Wound #19 Left,Plantar Foot: Other: - Change 2 times per week Skin Barriers/Peri-Wound Care: Wound #19 Left,Plantar Foot: Moisturizing lotion - to both legs Wound Cleansing: Wound #19 Left,Plantar Foot: Clean wound with Wound Cleanser Primary Wound Dressing: Wound #19 Left,Plantar Foot: Calcium Alginate Secondary Dressing: Wound #19 Left,Plantar Foot: Foam Dry Gauze Edema Control: Wound #19 Left,Plantar Foot: Kerlix and Coban - Left Lower Extremity Avoid standing for long periods of time Elevate legs to the level of the heart or above for 30 minutes daily and/or when sitting, a frequency of: - throughout the day Support Garment 20-30 mm/Hg  pressure to: - juxtalite to right leg Off-Loading: Open toe surgical shoe to: - left foot with felt. Home Health: [facility]. - [facility] 1. We will continue with calcium alginate foam and gauze. 2. She has a subluxed cuboid bone probably which makes it very difficult to deal with these wounds Electronic Signature(s) Signed: 06/08/2019 6:17:56 PM By: Linton Ham MD Entered By: Linton Ham on 06/08/2019 15:08:39 -------------------------------------------------------------------------------- SuperBill Details Patient Name: Date of Service: [name] 06/08/2019 Medical Record [id number] Patient Account Number: [id number] Date of Birth/Sex: Treating RN: 06-12-1946 (73 y.o. [name] Primary Care Provider: Chrisandra Netters Other Clinician: Referring Provider: Treating Provider/Extender:Delmar Arriaga, Nonda Lou, Ivan Anchors in Treatment: 48 Diagnosis Coding ICD-10 Codes Code Description E11.42 Type 2 diabetes mellitus with diabetic polyneuropathy L97.518 Non-pressure chronic ulcer of other part of right foot with other specified severity L97.521 Non-pressure chronic ulcer of other part of left foot limited to breakdown of skin L02.612 Cutaneous abscess of left foot S80.812D Abrasion, left lower leg, subsequent encounter Facility Procedures The patient participates with Medicare or their insurance follows the Medicare Facility Guidelines: CPT4 Code Description Modifier Quantity [id number] -  DEBRIDE WOUND 1ST 20 SQ CM OR < 1 ICD-10 Diagnosis Description L97.521 Non-pressure chronic ulcer  of other part of left foot limited to breakdown of skin Physician Procedures CPT4 Code Description: [id number] [id number] - WC PHYS DEBR WO ANESTH 20 SQ CM ICD-10 Diagnosis Description L97.521 Non-pressure chronic ulcer of other part of left foot limited Modifier: to breakdown Quantity: 1 of skin Electronic Signature(s) Signed:  06/08/2019 6:17:56 PM By: Linton Ham MD Entered By: Linton Ham on 06/08/2019 15:08:58

## 2019-07-27 NOTE — Progress Notes (Signed)
NIAH, HEINLE ([id number]) Visit Report for 03/24/2019 Arrival Information Details Patient Name: Date of Service: Joleene, [place] 03/24/2019 3:00 PM Medical Record [id number] Patient Account Number: [id number] Date of Birth/Sex: Treating RN: June 05, 1946 (73 y.o. [name] Primary Care Floria Brandau: Chrisandra Netters Other Clinician: Referring Nicholai Willette: Treating Jupiter Boys/Extender:Stone III, Anabel Bene, Ivan Anchors in Treatment: 97 Visit Information History Since Last Visit All ordered tests and consults were completed: No Patient Arrived: [name] Added or deleted any medications: No Arrival Time: 15:43 Any new allergies or adverse reactions: No Accompanied By: self Had a fall or experienced change in No Transfer Assistance: None activities of daily living that may affect Patient Identification Verified: Yes risk of falls: Secondary Verification Process Yes Signs or symptoms of abuse/neglect since last No Completed: visito Patient Requires Transmission- No Hospitalized since last visit: No Based Precautions: Implantable device outside of the clinic excluding No Patient Has Alerts: Yes cellular tissue based products placed in the center Patient Alerts: right non since last visit: compressable Has Dressing in Place as Prescribed: Yes Pain Present Now: No Electronic Signature(s) Signed: 07/27/2019 3:07:45 PM By: Carlene Coria RN Entered By: Carlene Coria on 03/24/2019 15:43:45 -------------------------------------------------------------------------------- Clinic Level of Care Assessment Details Patient Name: Date of Service: Lafawn, [place] 03/24/2019 3:00 PM Medical Record [id number] Patient Account Number: [id number] Date of Birth/Sex: Treating RN: 02-01-1946 (73 y.o. [name] Primary Care Landrie Beale: Chrisandra Netters Other Clinician: Referring Niti Leisure: Treating Shakeeta Godette/Extender:Stone III, Anabel Bene, Ivan Anchors in Treatment:  37 Clinic Level of Care Assessment Items TOOL 4 Quantity Score []  - Use when only an EandM is performed on FOLLOW-UP visit 0 ASSESSMENTS - Nursing Assessment / Reassessment X - Reassessment of Co-morbidities (includes updates in patient status) 1 10 X - Reassessment of Adherence to Treatment Plan 1 5 ASSESSMENTS - Wound and Skin Assessment / Reassessment X - Simple Wound Assessment / Reassessment - one wound 1 5 []  - Complex Wound Assessment / Reassessment - multiple wounds 0 []  - Dermatologic / Skin Assessment (not related to wound area) 0 ASSESSMENTS - Focused Assessment []  - Circumferential Edema Measurements - multi extremities 0 []  - Nutritional Assessment / Counseling / Intervention 0 X - Lower Extremity Assessment (monofilament, tuning fork, pulses) 1 5 []  - Peripheral Arterial Disease Assessment (using hand held doppler) 0 ASSESSMENTS - Ostomy and/or Continence Assessment and Care []  - Incontinence Assessment and Management 0 []  - Ostomy Care Assessment and Management (repouching, etc.) 0 PROCESS - Coordination of Care X - Simple Patient / Family Education for ongoing care 1 15 []  - Complex (extensive) Patient / Family Education for ongoing care 0 X - Staff obtains Programmer, systems, Records, Test Results / Process Orders 1 10 X - Staff telephones HHA, Nursing Homes / Clarify orders / etc 1 10 []  - Routine Transfer to another Facility (non-emergent condition) 0 []  - Routine Hospital Admission (non-emergent condition) 0 []  - New Admissions / Biomedical engineer / Ordering NPWT, Apligraf, etc. 0 []  - Emergency Hospital Admission (emergent condition) 0 X - Simple Discharge Coordination 1 10 []  - Complex (extensive) Discharge Coordination 0 PROCESS - Special Needs []  - Pediatric / Minor Patient Management 0 []  - Isolation Patient Management 0 []  - Hearing / Language / Visual special needs 0 []  - Assessment of Community assistance (transportation, D/C planning, etc.) 0 []  -  Additional assistance / Altered mentation 0 []  - Support Surface(s) Assessment (bed, cushion, seat, etc.) 0 INTERVENTIONS - Wound Cleansing / Measurement X - Simple Wound Cleansing - one  wound 1 5 []  - Complex Wound Cleansing - multiple wounds 0 X - Wound Imaging (photographs - any number of wounds) 1 5 []  - Wound Tracing (instead of photographs) 0 X - Simple Wound Measurement - one wound 1 5 []  - Complex Wound Measurement - multiple wounds 0 INTERVENTIONS - Wound Dressings X - Small Wound Dressing one or multiple wounds 1 10 []  - Medium Wound Dressing one or multiple wounds 0 []  - Large Wound Dressing one or multiple wounds 0 X - Application of Medications - topical 1 5 []  - Application of Medications - injection 0 INTERVENTIONS - Miscellaneous []  - External ear exam 0 []  - Specimen Collection (cultures, biopsies, blood, body fluids, etc.) 0 []  - Specimen(s) / Culture(s) sent or taken to Lab for analysis 0 []  - Patient Transfer (multiple staff / Civil Service fast streamer / Similar devices) 0 []  - Simple Staple / Suture removal (25 or less) 0 []  - Complex Staple / Suture removal (26 or more) 0 []  - Hypo / Hyperglycemic Management (close monitor of Blood Glucose) 0 []  - Ankle / Brachial Index (ABI) - do not check if billed separately 0 X - Vital Signs 1 5 Has the patient been seen at the hospital within the last three years: Yes Total Score: 105 Level Of Care: New/Established - Level 3 Electronic Signature(s) Signed: 03/24/2019 5:54:42 PM By: Baruch Gouty RN, BSN Entered By: Baruch Gouty on 03/24/2019 17:05:53 -------------------------------------------------------------------------------- Encounter Discharge Information Details Patient Name: Date of Service: [name] 03/24/2019 3:00 PM Medical Record [id number] Patient Account Number: [id number] Date of Birth/Sex: Treating RN: Dec 24, 1945 (73 y.o. [name] Primary Care Malin Sambrano: Chrisandra Netters Other  Clinician: Referring Ayda Tancredi: Treating Samora Jernberg/Extender:Stone III, Anabel Bene, Ivan Anchors in Treatment: 37 Encounter Discharge Information Items Discharge Condition: Stable Ambulatory Status: Ambulatory Discharge Destination: Home Transportation: Private Auto Accompanied By: self Schedule Follow-up Appointment: Yes Clinical Summary of Care: Patient Declined Electronic Signature(s) Signed: 07/27/2019 3:07:45 PM By: Carlene Coria RN Entered By: Carlene Coria on 03/24/2019 16:34:42 -------------------------------------------------------------------------------- Lower Extremity Assessment Details Patient Name: Date of Service: Ilani, [place]. 03/24/2019 3:00 PM Medical Record [id number] Patient Account Number: [id number] Date of Birth/Sex: Treating RN: Jul 28, 1946 (73 y.o. [name] Primary Care Chistine Dematteo: Chrisandra Netters Other Clinician: Referring Loc Feinstein: Treating Lennox Leikam/Extender:Stone III, Anabel Bene, Ivan Anchors in Treatment: 37 Edema Assessment Assessed: [Left: No] [Right: No] Edema: [Left: No] [Right: No] Calf Left: Right: Point of Measurement: 31 cm From Medial Instep 38 cm 38.5 cm Ankle Left: Right: Point of Measurement: 11.5 cm From Medial Instep 21 cm 21 cm Electronic Signature(s) Signed: 07/27/2019 3:07:45 PM By: Carlene Coria RN Entered By: Carlene Coria on 03/24/2019 15:45:08 -------------------------------------------------------------------------------- Multi-Disciplinary Care Plan Details Patient Name: Date of Service: [name] 03/24/2019 3:00 PM Medical Record [id number] Patient Account Number: [id number] Date of Birth/Sex: Treating RN: 08-02-1946 (73 y.o. [name] Primary Care Kyndall Chaplin: Chrisandra Netters Other Clinician: Referring Chanc Kervin: Treating Duel Conrad/Extender:Stone III, Anabel Bene, Ivan Anchors in Treatment: 37 Active Inactive Wound/Skin Impairment Nursing Diagnoses: Impaired tissue  integrity Knowledge deficit related to ulceration/compromised skin integrity Goals: Patient/caregiver will verbalize understanding of skin care regimen Date Initiated: 07/03/2018 Target Resolution Date: 04/16/2019 Goal Status: Active Ulcer/skin breakdown will have a volume reduction of 30% by week 4 Target Resolution Date Initiated: 07/03/2018 Date Inactivated: 07/31/2018 Date: 07/31/2018 Unmet Reason: necrotic Goal Status: Unmet toes, had vascular procedure Ulcer/skin breakdown will have a volume reduction of 50% by week 8 Date Initiated: 07/31/2018 Date Inactivated: 08/31/2018 Target Resolution Date:  09/04/2018 Goal Status: Met Ulcer/skin breakdown will have a volume reduction of 80% by week 12 Date Initiated: 08/31/2018 Date Inactivated: 10/05/2018 Target Resolution Date: 10/02/2018 Goal Status: Unmet Unmet Reason: PAD Interventions: Assess patient/caregiver ability to obtain necessary supplies Assess patient/caregiver ability to perform ulcer/skin care regimen upon admission and as needed Assess ulceration(s) every visit Provide education on ulcer and skin care Treatment Activities: Patient referred to home care : 07/03/2018 Skin care regimen initiated : 07/03/2018 Topical wound management initiated : 07/03/2018 Notes: Electronic Signature(s) Signed: 03/24/2019 5:54:42 PM By: Baruch Gouty RN, BSN Entered By: Baruch Gouty on 03/24/2019 17:03:58 -------------------------------------------------------------------------------- Pain Assessment Details Patient Name: Date of Service: [name] 03/24/2019 3:00 PM Medical Record [id number] Patient Account Number: [id number] Date of Birth/Sex: Treating RN: [birth year] (73 y.o. [name] Primary Care Osha Rane: Chrisandra Netters Other Clinician: Referring Wrenn Willcox: Treating Karnisha Lefebre/Extender:Stone III, Anabel Bene, Ivan Anchors in Treatment: 37 Active Problems Location of Pain Severity and Description of  Pain Patient Has Paino No Site Locations Pain Management and Medication Current Pain Management: Electronic Signature(s) Signed: 07/27/2019 3:07:45 PM By: Carlene Coria RN Entered By: Carlene Coria on 03/24/2019 15:44:39 -------------------------------------------------------------------------------- Patient/Caregiver Education Details Patient Name: Date of Service: Stickler, Gennett M. 8/5/2020andnbsp3:00 PM Medical Record [id number] Patient Account Number: [id number] Date of Birth/Gender: 08-Mar-1946 (73 y.o. F) Treating RN: Baruch Gouty Primary Care Physician: Chrisandra Netters Other Clinician: Referring Physician: Treating Physician/Extender:Stone III, Anabel Bene, Ivan Anchors in Treatment: 19 Education Assessment Education Provided To: Patient Education Topics Provided Offloading: Methods: Explain/Verbal Responses: Reinforcements needed, State content correctly Wound/Skin Impairment: Methods: Explain/Verbal Responses: Reinforcements needed, State content correctly Electronic Signature(s) Signed: 03/24/2019 5:54:42 PM By: Baruch Gouty RN, BSN Entered By: Baruch Gouty on 03/24/2019 17:04:58 -------------------------------------------------------------------------------- Wound Assessment Details Patient Name: Date of Service: [name] 03/24/2019 3:00 PM Medical Record [id number] Patient Account Number: [id number] Date of Birth/Sex: Treating RN: 1946-03-06 (73 y.o. [name] Primary Care Teila Skalsky: Chrisandra Netters Other Clinician: Referring Harve Spradley: Treating Christon Parada/Extender:Stone III, Anabel Bene, Ivan Anchors in Treatment: 37 Wound Status Wound Number: 19 Primary Diabetic Wound/Ulcer of the Lower Etiology: Extremity Wound Location: Left Foot - Plantar Wound Open Wounding Event: Gradually Appeared Status: Date Acquired: 08/07/2018 Comorbid Cataracts, Hypertension, Type II Diabetes, Weeks Of Treatment: 31 History: Rheumatoid  Arthritis Clustered Wound: No Photos Wound Measurements Length: (cm) 2.3 Width: (cm) 0.6 Depth: (cm) 0.1 Area: (cm) 1.084 Volume: (cm) 0.108 Wound Description Classification: Grade 2 Wound Margin: Flat and Intact Exudate Amount: Medium Exudate Type: Serosanguineous Exudate Color: red, brown Wound Bed Granulation Amount: Large (67-100%) Granulation Quality: Red, Hyper-granulation Necrotic Amount: None Present (0%) fter Cleansing: No ino No Exposed Structure ed: No ubcutaneous Tissue) Exposed: Yes ed: No ed: No d: No : No % Reduction in Area: -359.3% % Reduction in Volume: -129.8% Epithelialization: Large (67-100%) Tunneling: No Undermining: No Foul Odor A Slough/Fibr Fascia Expos Fat Layer (S Tendon Expos Muscle Expos Joint Expose Bone Exposed Electronic Signature(s) Signed: 03/25/2019 4:03:04 PM By: Mikeal Hawthorne Signed: 07/27/2019 3:07:45 PM By: Carlene Coria RN Entered By: Mikeal Hawthorne on 03/25/2019 09:02:41 -------------------------------------------------------------------------------- Vitals Details Patient Name: Date of Service: [name] 03/24/2019 3:00 PM Medical Record [id number] Patient Account Number: [id number] Date of Birth/Sex: Treating RN: 1946/08/01 (73 y.o. [name] Primary Care George Alcantar: Chrisandra Netters Other Clinician: Referring Charnika Herbst: Treating Arlin Savona/Extender:Stone III, Anabel Bene, Ivan Anchors in Treatment: 37 Vital Signs Time Taken: 15:43 Temperature (F): 98.2 Height (in): 62 Pulse (bpm): 68 Weight (lbs): 140 Respiratory Rate (breaths/min): 18 Body Mass Index (BMI):  25.6 Blood Pressure (mmHg): 129/66 Reference Range: 80 - 120 mg / dl Electronic Signature(s) Signed: 07/27/2019 3:07:45 PM By: Carlene Coria RN Entered By: Carlene Coria on 03/24/2019 15:44:29

## 2019-07-27 NOTE — Progress Notes (Signed)
MIIA, BLANKS ([id number]) Visit Report for 06/14/2019 Arrival Information Details Patient Name: Date of Service: [name], [name] 06/14/2019 2:15 PM Medical Record [id number] Patient Account Number: [id number] Date of Birth/Sex: Treating RN: Jan 11, 1946 (73 y.o. [name] Primary Care [name]: [name] Other Clinician: Referring [name]: Treating [name]/Extender:[name], [name], [name] in Treatment: 19 Visit Information History Since Last Visit All ordered tests and consults were completed: No Patient Arrived: [name] Added or deleted any medications: No Arrival Time: 15:19 Any new allergies or adverse reactions: No Accompanied By: self Had a fall or experienced change in No Transfer Assistance: None activities of daily living that may affect Patient Identification Verified: Yes risk of falls: Secondary Verification Process Yes Signs or symptoms of abuse/neglect since last No Completed: visito Patient Requires Transmission-Based No Hospitalized since last visit: No Precautions: Implantable device outside of the clinic excluding No Patient Has Alerts: Yes cellular tissue based products placed in the center Patient Alerts: right non since last visit: compressable Has Dressing in Place as Prescribed: Yes Has Compression in Place as Prescribed: Yes Pain Present Now: No Electronic Signature(s) Signed: 07/27/2019 3:01:15 PM By: Kari Coria RN Entered By: [name] on 06/14/2019 15:20:17 -------------------------------------------------------------------------------- Clinic Level of Care Assessment Details Patient Name: Date of Service: [name], [name] 06/14/2019 2:15 PM Medical Record [id number] Patient Account Number: [id number] Date of Birth/Sex: Treating RN: 01-Aug-1946 (73 y.o. [name] Primary Care [name]: [name] Other Clinician: Referring [name]: Treating [name]/Extender:[name],  [name], [name] in Treatment: 49 Clinic Level of Care Assessment Items TOOL 4 Quantity Score X - Use when only an EandM is performed on FOLLOW-UP visit 1 0 ASSESSMENTS - Nursing Assessment / Reassessment X - Reassessment of Co-morbidities (includes updates in patient status) 1 10 X - Reassessment of Adherence to Treatment Plan 1 5 ASSESSMENTS - Wound and Skin Assessment / Reassessment X - Simple Wound Assessment / Reassessment - one wound 1 5 _0  - Complex Wound Assessment / Reassessment - multiple wounds 0 _1  - Dermatologic / Skin Assessment (not related to wound area) 0 ASSESSMENTS - Focused Assessment _2  - Circumferential Edema Measurements - multi extremities 0 _3  - Nutritional Assessment / Counseling / Intervention 0 X - Lower Extremity Assessment (monofilament, tuning fork, pulses) 1 5 _4  - Peripheral Arterial Disease Assessment (using hand held doppler) 0 ASSESSMENTS - Ostomy and/or Continence Assessment and Care _5  - Incontinence Assessment and Management 0 _6  - Ostomy Care Assessment and Management (repouching, etc.) 0 PROCESS - Coordination of Care X - Simple Patient / Family Education for ongoing care 1 15 _7  - Complex (extensive) Patient / Family Education for ongoing care 0 X - Staff obtains Programmer, systems, Records, Test Results / Process Orders 1 10 X - Staff telephones HHA, Nursing Homes / Clarify orders / etc 1 10 _8  - Routine Transfer to another Facility (non-emergent condition) 0 _9  - Routine Hospital Admission (non-emergent condition) 0 _10  - New Admissions / Biomedical engineer / Ordering NPWT, Apligraf, etc. 0 _11  - Emergency Hospital Admission (emergent condition) 0 X - Simple Discharge Coordination 1 10 _12  - Complex (extensive) Discharge Coordination 0 PROCESS - Special Needs _13  - Pediatric / Minor Patient Management 0 _14  - Isolation Patient Management 0 _15  - Hearing / Language / Visual special needs 0 _16  - Assessment of Community assistance  (transportation, D/C planning, etc.) 0 _17  - Additional assistance / Altered mentation 0 _18  - Support Surface(s) Assessment (bed, cushion, seat, etc.) 0 INTERVENTIONS - Wound Cleansing / Measurement X -  Simple Wound Cleansing - one wound 1 5 _0  - Complex Wound Cleansing - multiple wounds 0 X - Wound Imaging (photographs - any number of wounds) 1 5 _1  - Wound Tracing (instead of photographs) 0 X - Simple Wound Measurement - one wound 1 5 _2  - Complex Wound Measurement - multiple wounds 0 INTERVENTIONS - Wound Dressings _3  - Small Wound Dressing one or multiple wounds 0 _4  - Medium Wound Dressing one or multiple wounds 0 _5  - Large Wound Dressing one or multiple wounds 0 <AYTKZSWFUXNATFTD>_3<\/UKGURKYHCWCBJSEG>_3  - Application of Medications - topical 0 <TDVVOHYWVPXTGGYI>_9<\/SWNIOEVOJJKKXFGH>_8  - Application of Medications - injection 0 INTERVENTIONS - Miscellaneous _8  - External ear exam 0 _9  - Specimen Collection (cultures, biopsies, blood, body fluids, etc.) 0 _10  - Specimen(s) / Culture(s) sent or taken to Lab for analysis 0 _11  - Patient Transfer (multiple staff / Civil Service fast streamer / Similar devices) 0 _12  - Simple Staple / Suture removal (25 or less) 0 _13  - Complex Staple / Suture removal (26 or more) 0 _14  - Hypo / Hyperglycemic Management (close monitor of Blood Glucose) 0 _15  - Ankle / Brachial Index (ABI) - do not check if billed separately 0 X - Vital Signs 1 5 Has the patient been seen at the hospital within the last three years: Yes Total Score: 90 Level Of Care: New/Established - Level 3 Electronic Signature(s) Signed: 06/14/2019 6:04:10 PM By: Levan Hurst RN, BSN Entered By: Levan Hurst on 06/14/2019 15:39:21 -------------------------------------------------------------------------------- Encounter Discharge Information Details Patient Name: Date of Service: [name] 06/14/2019 2:15 PM Medical Record [id number] Patient Account Number: [id number] Date of Birth/Sex: Treating RN: [birth year] (73 y.o. [name] Primary Care Malavika Lira:  [name] Other Clinician: Referring Sieara Bremer: Treating Hamid Brookens/Extender:[name], [name], [name] in Treatment: 49 Encounter Discharge Information Items Discharge Condition: Stable Ambulatory Status: Walker Discharge Destination: Home Transportation: Private Auto Accompanied By: self Schedule Follow-up Appointment: Yes Clinical Summary of Care: Electronic Signature(s) Signed: 06/14/2019 5:31:38 PM By: Deon Pilling Entered By: Deon Pilling on 06/14/2019 15:53:51 -------------------------------------------------------------------------------- Lower Extremity Assessment Details Patient Name: Date of Service: MERCEDE, ROLLO 06/14/2019 2:15 PM Medical Record [id number] Patient Account Number: [id number] Date of Birth/Sex: Treating RN: 07-01-1946 (73 y.o. [name] Primary Care Korrie Hofbauer: [name] Other Clinician: Referring Cammie Faulstich: Treating Tahirih Lair/Extender:[name], [name], [name] in Treatment: 49 Edema Assessment Assessed: [Left: No] [Right: No] Edema: [Left: Yes] [Right: Yes] Calf Left: Right: Point of Measurement: 31 cm From Medial Instep 31 cm cm Ankle Left: Right: Point of Measurement: 11.5 cm From Medial Instep 27 cm cm Vascular Assessment Pulses: Dorsalis Pedis Palpable: [Left:Yes] Electronic Signature(s) Signed: 06/14/2019 6:04:10 PM By: Levan Hurst RN, BSN Entered By: Levan Hurst on 06/14/2019 15:37:35 -------------------------------------------------------------------------------- Multi Wound Chart Details Patient Name: Date of Service: [name] 06/14/2019 2:15 PM Medical Record [id number] Patient Account Number: [id number] Date of Birth/Sex: Treating RN: 1946-04-21 (73 y.o. 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] Comorbid History: [19:Cataracts, Hypertension, N/A Type II Diabetes, Rheumatoid Arthritis] Date Acquired: [19:08/07/2018] [N/A:N/A] Weeks of Treatment: [19:43] [N/A:N/A] Wound Status: [19:Open] [N/A:N/A] Clustered Wound: [19:Yes] [N/A:N/A] Clustered Quantity: [19:2] [N/A:N/A] Measurements L x W x D 0x0x0 [N/A:N/A] (cm)  Area (cm) : [19:0] [N/A:N/A] Volume (cm) : [19:0] [N/A:N/A] % Reduction in Area: [19:100.00%] [N/A:N/A] % Reduction in Volume: 100.00% [N/A:N/A] Classification: [19:Grade 2] [N/A:N/A] Exudate Amount: [19:None Present] [N/A:N/A] Wound Margin: [19:Flat and Intact] [N/A:N/A] Granulation Amount: [19:None Present (0%)] [N/A:N/A] Necrotic Amount: [19:None Present (0%)] [N/A:N/A] Exposed Structures: [19:Fascia: No Fat Layer (Subcutaneous Tissue) Exposed: No Tendon: No Muscle: No Joint: No Bone: No Large (67-100%)] [N/A:N/A N/A] Treatment Notes Wound #19 (Left, Plantar Foot) 1. Cleanse With Wound Cleanser Soap and water 3. Primary Dressing Applied Foam 4. Secondary Dressing Foam Border Dressing 5. Secured With Self Adhesive Bandage Notes felt in surgical shoe to left foot. Electronic Signature(s) Signed: 06/14/2019 5:48:14 PM By: Linton Ham MD Signed: 06/14/2019 6:04:10 PM By: Levan Hurst RN, BSN Entered By: Linton Ham on 06/14/2019 15:57:23 -------------------------------------------------------------------------------- Multi-Disciplinary Care Plan Details Patient Name: Date of Service: [name] 06/14/2019 2:15 PM Medical Record [id number] Patient Account Number:  [id number] Date of Birth/Sex: Treating RN: 1945/10/06 (73 y.o. [name] Primary Care Ugochi Henzler: [name] Other Clinician: Referring Sarah Baez: Treating Novali Vollman/Extender:[name], [name], [name] in Treatment: 20 Active Inactive Wound/Skin Impairment Nursing Diagnoses: Impaired tissue integrity Knowledge deficit related to ulceration/compromised skin integrity Goals: Patient/caregiver will verbalize understanding of skin care regimen Date Initiated: 07/03/2018 Target Resolution Date: 07/16/2019 Goal Status: Active Ulcer/skin breakdown will have a volume reduction of 30% by week 4 Target Resolution Date Inactivated: 07/31/2018 Target Resolution Date Initiated: 07/03/2018 Date: 07/31/2018 Unmet Reason: necrotic Goal Status: Unmet toes, had vascular procedure Ulcer/skin breakdown will have a volume reduction of 50% by week 8 Date Initiated: 07/31/2018 Date Inactivated: 08/31/2018 Target Resolution Date: 09/04/2018 Goal Status: Met Ulcer/skin breakdown will have a volume reduction of 80% by week 12 Date Initiated: 08/31/2018 Date Inactivated: 10/05/2018 Target Resolution Date: 10/02/2018 Goal Status: Unmet Unmet Reason: PAD Interventions: Assess patient/caregiver ability to obtain necessary supplies Assess patient/caregiver ability to perform ulcer/skin care regimen upon admission and as needed Assess ulceration(s) every visit Provide education on ulcer and skin care Treatment Activities: Patient referred to home care : 07/03/2018 Skin care regimen initiated : 07/03/2018 Topical wound management initiated : 07/03/2018 Notes: Electronic Signature(s) Signed: 06/14/2019 6:04:10 PM By: Levan Hurst RN, BSN Entered By: Levan Hurst on 06/14/2019 15:16:40 -------------------------------------------------------------------------------- Pain Assessment Details Patient Name: Date of Service: [name] 06/14/2019 2:15 PM Medical Record  [id number] Patient Account Number: [id number] Date of Birth/Sex: Treating RN: February 15, 1946 (73 y.o. [name] Primary Care Mckynna Vanloan: [name] Other Clinician: Referring Gwenivere Hiraldo: Treating Nissa Stannard/Extender:[name], [name], [name] in Treatment: 49 Active Problems Location of Pain Severity and Description of Pain Patient Has Paino No Site Locations Pain Management and Medication Current Pain Management: Electronic Signature(s) Signed: 07/27/2019 3:01:15 PM By: Kari Coria RN Entered By: [name] on 06/14/2019 15:20:52 -------------------------------------------------------------------------------- Patient/Caregiver Education Details Patient Name: Date of Service: [name] 10/26/2020andnbsp2:15 PM Medical Record [id number] Patient Account Number: [id number] Date of Birth/Gender: 12-Jun-1946 (73 y.o. F) Treating RN: Levan Hurst Primary Care Physician: [name] Other Clinician: Referring Physician: Treating Physician/Extender:[name], [name], [name] in Treatment: 10 Education Assessment Education Provided To: Patient Education Topics Provided Wound/Skin Impairment: Methods: Explain/Verbal Responses: State content correctly [employer]) Signed: 06/14/2019 6:04:10 PM By: Levan Hurst RN, BSN Entered By: Levan Hurst on 06/14/2019 15:16:59 -------------------------------------------------------------------------------- Wound Assessment Details Patient Name: Date of Service: [name] 06/14/2019 2:15 PM Medical Record [id number] Patient Account Number: [id number] Date of Birth/Sex: Treating RN: [birth year] (73 y.o. [name] Primary Care Taffy Delconte: [name] Other  Clinician: Referring Haidan Nhan: Treating Yomayra Tate/Extender:[name], [name], [name] in Treatment: 49 Wound Status Wound Number: 19 Primary Diabetic Wound/Ulcer of the  Lower Etiology: Extremity Wound Location: Left Foot - Plantar Wound Open Wounding Event: Gradually Appeared Status: Date Acquired: 08/07/2018 Comorbid Cataracts, Hypertension, Type II Diabetes, Weeks Of Treatment: 43 History: Rheumatoid Arthritis Clustered Wound: Yes Photos Wound Measurements Length: (cm) 0 % Reduction i Width: (cm) 0 % Reduction i Depth: (cm) 0 Epithelializa Clustered Quantity: 2 Tunneling: Area: (cm) 0 Undermining: Volume: (cm) 0 Wound Description Classification: Grade 2 Foul Odor Af Wound Margin: Flat and Intact Slough/Fibri Exudate Amount: None Present Wound Bed Granulation Amount: None Present (0%) Necrotic Amount: None Present (0%) Fascia Expos Fat Layer (S Tendon Expos Muscle Expos Joint Expose Bone Exposed ter Cleansing: No no No Exposed Structure ed: No ubcutaneous Tissue) Exposed: No ed: No ed: No d: No : No n Area: 100% n Volume: 100% tion: Large (67-100%) No No Electronic Signature(s) Signed: 06/15/2019 3:44:28 PM By: Mikeal Hawthorne EMT/HBOT Signed: 07/27/2019 3:01:15 PM By: Kari Coria RN Entered By: Mikeal Hawthorne on 06/15/2019 09:01:40 -------------------------------------------------------------------------------- Vitals Details Patient Name: Date of Service: [name] 06/14/2019 2:15 PM Medical Record [id number] Patient Account Number: [id number] Date of Birth/Sex: Treating RN: February 18, 1946 (73 y.o. [name] Primary Care Shameria Trimarco: [name] Other Clinician: Referring Aleasha Fregeau: Treating Shanee Batch/Extender:[name], [name], [name] in Treatment: 49 Vital Signs Time Taken: 15:20 Temperature (F): 98.3 Height (in): 62 Pulse (bpm): 78 Weight (lbs): 140 Respiratory Rate (breaths/min): 20 Body Mass Index (BMI): 25.6 Blood Pressure (mmHg): 143/76 Reference Range: 80 - 120 mg / dl Electronic Signature(s) Signed: 07/27/2019 3:01:15 PM By: Kari Coria RN Entered By: [name] on 06/14/2019 15:20:45

## 2019-07-27 NOTE — Progress Notes (Signed)
MECKENZIE, BALSLEY ([id number]) Visit Report for 06/01/2019 Debridement Details Patient Name: Date of Service: HALLIE, ERTL 06/01/2019 11:00 AM Medical Record [id number] Patient Account Number: [id number] Date of Birth/Sex: Jan 05, 1946 (73 y.o. F) Treating RN: Carlene Coria Primary Care Provider: Chrisandra Netters Other Clinician: Referring Provider: Treating Provider/Extender:Rhiana Morash, Nonda Lou, Ivan Anchors in Treatment: 47 Debridement Performed for Wound #19 Left,Plantar Foot Assessment: Performed By: Physician Ricard Dillon., MD Debridement Type: Debridement Severity of Tissue Pre Fat layer exposed Debridement: Level of Consciousness (Pre- Awake and Alert procedure): Pre-procedure Verification/Time Out Taken: Yes - 12:22 Start Time: 12:22 Pain Control: Lidocaine 5% topical ointment Total Area Debrided (L x W): 0.4 (cm) x 0.7 (cm) = 0.28 (cm) Tissue and other material Viable, Non-Viable, Callus, Slough, Subcutaneous, Slough debrided: Level: Skin/Subcutaneous Tissue Debridement Description: Excisional Instrument: Curette Bleeding: Moderate Hemostasis Achieved: Silver Nitrate End Time: 12:26 Procedural Pain: 0 Post Procedural Pain: 0 Response to Treatment: Procedure was tolerated well Level of Consciousness Awake and Alert (Post-procedure): Post Debridement Measurements of Total Wound Length: (cm) 0.4 Width: (cm) 0.7 Depth: (cm) 0.2 Volume: (cm) 0.044 Character of Wound/Ulcer Post Improved Debridement: Severity of Tissue Post Debridement: Fat layer exposed Post Procedure Diagnosis Same as Pre-procedure Electronic Signature(s) Signed: 06/01/2019 5:42:33 PM By: Linton Ham MD Signed: 07/27/2019 3:01:15 PM By: Carlene Coria RN Entered By: Linton Ham on 06/01/2019 13:09:39 -------------------------------------------------------------------------------- HPI Details Patient Name: Date of Service: [name] 06/01/2019 11:00  AM Medical Record [id number] Patient Account Number: [id number] Date of Birth/Sex: Treating RN: February 10, 1946 (73 y.o. [name] Primary Care Provider: Chrisandra Netters Other Clinician: Referring Provider: Treating Provider/Extender:Lynnley Doddridge, Nonda Lou, Ivan Anchors in Treatment: 47 History of Present Illness HPI Description: 73 year old patient who is known to our practice from May of last year was fully worked up with a venous and arterial duplex study and was referred to the vascular surgeon for follow-up. The patient says she has seen the vascular surgeons and has an appointment back in April. No procedure was done. She has developed a blister on the left big toe and forefoot which has been fairly large and draining fluid and she self-referred herself to [place]. The patient was recently seen in the ER on 10/06/2016, and was treated with clindamycin and asked to see the podiatrist and PCP. She saw the podiatrist Dr. Felisa Bonier on 10/11/2016 who I understand did an x-ray but no report is available. Of note last year when I saw her, I had referred her to do Dr. Ruta Hinds -- he saw on 02/04/2016. he reviewed and noted that she had evidence of peripheral arterial disease and recommended a follow-up in 6 months for repeat noninvasive arterial exam. Wounds worsened he would consider an angiogram. He saw her back on 03/14/2016, and continued conservative treatment and asked her to come back for noninvasive studies in 6 months. 12/03/2016 -- was seen in the office on 11/28/2016 and note is made of the fact that venous duplex examination on 01/02/2016 showed no evidence of reflux. Did have evidence of peripheral arterial disease and was asked to follow- up in 6 months time. Recent data on 11/28/2016 showed right noncompressible with monophasic waveforms left noncompressible with monophasic waveforms. The thought was she has medial calcifications of the arteries and had bilateral toe  brachial indices which were normal. Right toe brachial index was 0.74 and the left toe brachial index was 0.80 She would return for a vascular study in 6 months when her ABI would be checked again. 7/6-Patient is back at  1 week visit to the clinic, home health is attending to the wound once a week, we have been using silver alginate, MRI scheduled this week on Thursday for the left foot. We are seeing her for the left foot plantar ulcer and the right great toe ulcer 12/24/2016 -- the left plantar heel had a large callus which came off during her evaluation and there was an open ulcer at the base ==== Old notes She was recently seen by her PCP for an ulcer of the left toe which was treated at the urgent care by giving her ciprofloxacin which she has been taking. She has uncontrolled diabetes and A1c was 13.7 done last week. Past medical history significant for poorly controlled diabetes mellitus type 2, obesity, osteopenia, GERD, diabetic retinopathy, diabetic neuropathy, reported arthritis, hypothyroidism, hypertension. No x-rays were done recently. 12/27/2015 -- had an x-ray of the left foot --IMPRESSION:Juxta-articular erosions at the IP joint great toe and diffusely in the MTP joints greatest at first, suggesting an inflammatory arthropathy such as rheumatoid arthritis or gout.While septic arthritis can cause juxta-articular erosions, the multiplicity of joints involved makes this less likely. Significant soft tissue swelling with single tiny questionable focus of soft tissue gas medial to the first metatarsal head. If patient has persistent symptoms or persistent clinical concern for osteomyelitis, recommend MR imaging of the LEFT foot (with and without contrast if renal function permits). 01/03/2016 -- the lower extremity venous duplex reflex evaluation showed no evidence of deep or superficial thrombus or reflux. The arterial study done and showed the resting right ABI is greater than  1.3 and the left ABI was greater than 1.3 indicating arterial medial calcification. The right TBI was less than 0.64 and the left ABI was less than 0.64 both of which are abnormal. except for the left posterior tibial which is triphasic on the flow is biphasic. With the above results due to the fact that she has diabetes mellitus and would recommend she sees the vascular surgeon to see if any further intervention or procedure needs to be planned. ====== READMISSION 09/19/17;this is a 73 year old woman that we've had in our clinic previously for wounds on predominantly her left foot left toe and left heel. She is a diabetic. She has had lower extremity workups for both venous reflux disease and arterial disease. She has known noncompressible vessels bilaterally however in May 2017 as noted above her TBI was 0.64 and the left TBI was less than 0.64. When she was last in clinicin May 2017 the left heel wound had closed. She was faithfully wearing her juxta light stockings. She tells me 2-3 weeks ago she developed increasing edema in her legs. She was seen by Dr. Valentino Saxon of nephrology who apparently increased her Lasix from 40-80 mg twice a day. The patient has not noticed any improvement in the edema. She does not weigh herself. About a week ago she noted blisters on her legs on the right 2 on the left 1 since then she has not been wearing the juxta light stockings. She has not had any pain no shortness of breath 09/26/17;; the patient has a stable wound as a result of blistering on the left anterior calf and 2 on the right anterior and right medial. There is another denuded blister on the left anterior more superior. This does not have any current fluid I did not remove the skin today. We've been using silver alginate under compression 10/03/17; the patient's right leg is healed. The area on the left  anterior leg all wound sites look better. We've been using silver alginate under compression. In  questioning the patient apparently has a juxta light stockings however she has not been using these. We asked her to find them lubricate her skin every night and used a juxta light stocking on the right. I would like to see this next time 10/10/17; both the patient's areas on the left anterior leg 2 are closed the area on the look right posterior medial calf remains closed. She is been using a juxta light stocking on the right she has one for the left Readmission Mrs. Joya Gaskins is now a 73 year old woman we have had in this clinic at least 3 times before. Most recently she was here in February 2019 for 2 visits with wounds on her bilateral lower extremities because of blisters these healed fairly quickly and we discharged her in juxta lite stockings. She was previously here in 2018 with a wound on her left foot and in 2017 she was sent to see Dr. Oneida Alar for follow-up of PAD. Indeed she seems to have had follow-up noninvasive vascular studies every 6 months. Her current problems began at the end of October she apparently suffered a fall with a laceration on her right anterior leg. She had this sutured on 06/23/2018. Sometime in the same timeframe she developed blistering over a large area of the left anterior calf, the right great toe. The blisters have spontaneously ruptured and she has large areas of the epithelial loss. She also has non-open blisters on the medial part of the left great toe. There is also an open area on the right second toe. I do not believe that she was using her compression stockings [not juxta lites] because the swelling in her legs had gotten too large for her to wrap. She tells me she is recently been to her nephrologist and had her Lasix increased which is helped somewhat with the swelling. The patient did not have arterial studies done in our clinic. Her most recent noninvasive studies were in August. These showed ABIs noncompressible bilaterally. She had TBI's on the right  at 0.54 and on the left at 0.60 biphasic waveforms at the PTA and DP bilaterally. She was felt to have bilateral ABIs that were unchanged from her previous study in October 2018. However it was noted her TBI's were decreased. Past medical history includes type 2 diabetes with peripheral neuropathy with a recent hemoglobin A1c of 7. She is apparently off her diabetes medications, she has stage III chronic renal failure, hyperlipidemia, hypertension, cataracts, retinopathy, rheumatoid arthritis, iron deficiency and apparently is receiving IV iron, urge incontinence 07/10/2018; she arrives today with everything looking quite a bit better. The large area of blistered denuded epithelium on the left calf has healed over. The area on the right lateral tibial area is also mostly healed still with a linear open area where her laceration was. The area on her left medial toe is just about healed. She has a dark thick black eschar over the tip of her right first toe. I do not remember this from last week this feels almost like the eschar you would see with ischemia yet her toes are warm and her peripheral pulses are palpable. She does have some degree of PAD but I do not think that was sufficient enough to have caused this. 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 Patient originally came here with a laceration injury of her right lateral mid  calf. She also had an area of denuded skin on the left which is since closed over. She had a small area on the tip of her right great toe as well. She arrives today with what looks to be dry gangrene on the right great toe as well as discoloration of the tips of her second and third toes. She is not in any pain. She also had purulent drainage coming out open area on her left midfoot which was new this week. Culture was obtained The patient is known to vascular surgery having last been seen on 04/02/2018. They follow her for  chronic arterial insufficiency. Last arterial studies were in August 2017 at which time her ABIs were noncompressible however her TBI was 0.54 on the right and 0.60 on the left. She was noted to have bilateral ankle arteries remain calcified. Waveforms biphasic. Slight decline in bilateral TBI's 07/24/2018; the patient was graciously seen urgently by vein and vascular with regards to critical limb ischemia, she had a CO2 angiogram and then contrast to look at the lower extremity arteries. Surprisingly no significant vascular disease was noted in the aortic iliac set segment, patent common femoral and profunda arteries patent popliteal and three-vessel runoff with all 3 of the tibials present. Her dorsalis pedis and plantar arch vessels also filled. Notable for the fact that it was felt that she had small vessel disease near her toes but this was not amenable for intervention. The abscess that I unroofed last week on the plantar left foot grew Stenotrophomonas Maltophilia. This is not a usual true wound pathogen. However it had abundant organisms and given the fact that is an abscess I went ahead and treated this with Levaquin 250 daily for 7 days. I had her on doxycycline last week 07/31/2018; I put 3 layer compression on the right leg last week. She arrives in today with almost no edema in the right leg and out of concern for the this in the gangrenous toes I am going to reduce this back to Kerlix and Coban which is what we had her on at the beginning. We are using silver alginate to the traumatic wound on her right anterior leg and this looks a lot better. The area on the left foot is healed and the patient is using her own wraparound compression stocking here. She still has the dry gangrene-like changes predominantly involving the right first toe, tip of her right second and third toes. We are using Betadine here. The exact etiology of this was not completely clear 08/07/18; we've been using 2  layer compression on the right leg. She still has the gangrenous toes for second and third on the right and the original laceration injury. She does not have any significant vascular issues to the level of the ankle. She does have small vessel disease in her toes.The left mid foot wound is closed 08/14/18; the patient arrives in clinic today with improvement in the original area on her right lateral leg. The 3 dry gangrenous toes first second and third on the right all look stable except for the second. This was obviously separating. It was removed fortunately there is healthy-looking surface tissue underneath this. She arrives in clinic with a reopening on the left midfoot. This was previously a small abscess that had closed. She has Charcot feet bilaterally 08/24/2018; right lateral leg wound is smaller. The 3 dry gangrenous first second and third toes are a bit changed. The third toe was separating I remove the eschar and  subcutaneous tissue to reveal a small open area at the tip of the toe just at the base at the head of the nail. The first toe is unchanged. Left plantar foot is larger. This is in the Charcot foot area. I removed nonviable circumference and surface from the wound using silver alginate 08/31/2018; right lateral leg wound continues to contract also the area on her plantar foot. First great toe is beginning to separate and the nail will soon I think fall off.. She sees vein and vascular tomorrow. 1/20; right lateral leg wound continues to contract and is almost closed first great toe is beginning to separate. The area on her left plantar foot still has rolled edges around the wound and some debris in the surface but generally smaller. We have been using silver alginate to the 2 remaining wound areas and Betadine to the left great toe. The patient saw vein and vascular on 1/14. They did not add anything here. Noted that the patient has microvascular disease but no major  macrovascular issues. No intervention was performed during her CO2 aortogram on 07/23/2018. 2/3; 2-week follow-up. Right leg wound which is her original wound and coming here is healed. Second and third right toes have healed. She still has dry eschared/dry gangrene over the right first toe. I thought this might begin to separate but it really is not making great headway with that. The area on the left midfoot reopened 2 to 3 weeks ago and this is made no progress. Finally she has new blisters on the left first toe laterally and the left second toe dorsally over the DIP at the base of the nail 2/17; 2-week follow-up. Right leg wound which is her original wound is healed. Right second and third toes are still healed. She still has the black ischemic eschar over the first toe which has not really begun to separate. The area on the left midfoot is unfortunately worse with large amounts of denuded skin around this. She has blisters on the medial part of her first toe and the dorsal part of the second toe on the left 2/24; she still has the black ischemic eschar over the first toe which is not really separating. The area on the left midfoot seems stable from last week. The blisters on the medial part of her first toe and dorsal second toe had hardened and I removed both of these there are small open areas underneath. 3/2; left first and second toes are healed. The area on the left plantar foot looks better. Finally the right great toe area is beginning to separate. 3/9; left first and second toes remain healed. Left plantar foot continues to look excellent smaller with a healthy base. Finally the right great toe had separated further. I went ahead and remove the eschar here. There is still a fairly sizable wound here but this will give [place] a better chance to address this. 3/16-Left first and second toes mostly healed although the left first toe appears to have an open area Right great toe deep ulcer with  exposed bone, greenish debris with necrotic tissue, right second toe wound with clean edges and base. Patient had silver alginate dressing to all the toes especially on the right. SHe has been offloading with inserts and open toe shoes 3/23; since I last saw this patient 2 weeks ago there is been quite a bit of deterioration. The left plantar foot continues to look about the same. She now has an area on the left lateral  first toe. On the right foot the first toe has a fair amount of exposed bone. X-ray that we did last week did not show osteomyelitis nevertheless that would have to be a concern. She also has an area on the tip of the right second toe that is new 3/30; some improvement in the left plantar foot. Bone I took out of the right first toe last week did not show osteomyelitis under pathology however culture did grow rare staph aureus and rare Citrobacter. We have been using silver alginate to all her wounds 4/6; left plantar foot wound about the same although this does not look ominous. The right first toe which is been very problematic does not have exposed bone aggressive debridement of the surface of this. She does have grams of epithelialization. We have been using silver alginate on both wounds 4/13; left plantar foot wound about the same. Removed skin and subcutaneous tissue from the wound circumference. The right great toe has underlying osteomyelitis I have extended her Levaquin 500 mg for another 2 weeks. She does not complain of any overt side effects. 4/23;. The patient has wounds on the left plantar foot, left lateral great toe and a new area on the dorsal part of the right second toe. Her large area over the tip of her right great toe. She is on Levaquin I will need to check on when we want to finish this. This is for underlying osteomyelitis of the right great toe 5/4; the patient has or should be completing her Levaquin at this point. This is for osteomyelitis in the  right great toe. She also has wounds on her left plantar foot and the dorsal part of her right second toe. We have been using silver alginate 5/11; patient arrives today with her right great toe generally looking better. However the area on the plantar left foot had considerable undermining and discomfort around the wound and some erythema. We have been using silver alginate 5/18-Patient returns at 1 week the right great toe is much more macerated around the edges of the wound, the left foot wounds are considerably better especially the left great toe and left second toe. We have been using silver alginate to all wounds and juxta lite to the right leg and 2 layer compression to the left. THere is now a fluid filled bleb on the posterior calf of RLE 5/26; right great toe continues to improve. The underlying osteomyelitis was treated empirically in this clinic. Left midfoot wound also looks better than the last time I saw this 2 weeks ago. Apparently last week she had a blister on the right posterior calf that was largely. This was not specifically addressed. It is open since last time she has a large area on the right posterior mid calf with skin attached to a large amount of the circumference. 6/1; we continue to have true improvements in both of the wound areas on the right great toe and left plantar foot. The large blister on the posterior right calf is just about fully epithelialized although there is still some weeping edema in this area 6/8; we continued to have been improvements in both areas currently on the right great toe and left plantar foot. The large blister on the posterior right calf is fully healed. 6/15; we we continue to have improvements in the right great toe. The left plantar foot which was a small wound without undermining last week has decided to expand into the lateral foot. There is some swelling  in this area. She is not systemically unwell 6/22; the culture of her  left foot that I did last week grew Enterobacter I had her on doxycycline. There was also clearly some purulent drainage coming from this today through the original wound with the swelling on the lateral part of her foot. She is not systemically unwell. 6/25 the patient is on Levaquin for the Enterobacter cultured out of her left foot. She now has an extensive wound area in the medial left foot we are using silver alginate The patient complains of pain in the foot going up towards the metatarsal head. There is also swelling inferiorly to this area that we identified last week but this is not tender 6/29; the patient is completing her Levaquin for the Enterobacter. We are using silver alginate to the extensive wound on the left plantar foot as well as to the right great toe 7/14; the right great toe is healed today. The area on the left foot looks better. The MRI of the left foot did not show osteomyelitis in the area of the wound however it did show an effusion in the tibiotalar joint. The patient has rheumatoid arthritis 7/20; right great toe remains healed. The area on the left foot continues to look improved. 7/27 right great toe remains healed. The area on the left foot continues to contract. Healthy granulation tissue she is only offloading this in surgical shoes. 03/24/2019 on evaluation today patient appears to be doing very well with regard to her plantar foot ulcer. She has been tolerating the dressing changes without complication. She has a healthy granulation surface. There is no signs of active infection at this time and her swelling seems to be under good control. 8/17; the areas on the left medial midfoot. Probably subluxed bone in this area. Wounds are superficial. It looks as though there was some blistering skin at one point 8/24; the area on the left medial midfoot. This is completely resolved at this point although it still looks somewhat vulnerable. She probably has subluxed  bone underneath this area. She has pes planus deformity 9/14; apparently this wound reopened in the 3 weeks since I saw her. She was seen last week she is given new healing sandals. Been using calcium alginate with felt offloading. This is not surprising given the area looks somewhat vulnerable. 9/28; left plantar foot in the setting of diabetic neuropathy probably some degree of Charcot deformity. She also has abrasions on the left leg which she says are due to the Curlex co-band that was put on by home health. She is also having a lot of pain in the left calf finds the wrap too tight 10/5; left plantar foot has 2 superficial open areas. The abrasions on the left leg from last week are healed. She still has some swelling and tenderness however. Duplex ultrasound of the leg tomorrow tomorrow. 10/13; patient arrived with a deterioration in the left foot from last week. Undermining wound probably connecting the 2 superficial areas from last week. She wears a surgical shoe with felt offloading. Duplex ultrasound she had last week showed a ruptured Baker's cyst. Her leg looks a lot better Electronic Signature(s) Signed: 06/01/2019 5:42:33 PM By: Linton Ham MD Entered By: Linton Ham on 06/01/2019 13:11:05 -------------------------------------------------------------------------------- Physical Exam Details Patient Name: Date of Service: [name] 06/01/2019 11:00 AM Medical Record [id number] Patient Account Number: [id number] Date of Birth/Sex: Treating RN: 08/02/1946 (73 y.o. [name] Primary Care Provider: Chrisandra Netters Other Clinician: Referring Provider:  Treating Provider/Extender:Lileigh Fahringer, Nonda Lou, [place] Weeks in Treatment: 47 Constitutional Patient is hypertensive.. Pulse regular and within target range for patient.Marland Kitchen Respirations regular, non-labored and within target range.. Temperature is normal and within the target range for the patient.Marland Kitchen  Appears in no distress. Notes Wound exam; there is no open wound in the right leg or foot. The left leg has no open areas. There is still the original area on the lateral part of her left foot. She has a Charcot foot there is probably some subluxation of the cuboid bone. Debrided with a #5 curette removing eschar skin and some subcutaneous debris. Hemostasis with silver nitrate Electronic Signature(s) Signed: 06/01/2019 5:42:33 PM By: Linton Ham MD Entered By: Linton Ham on 06/01/2019 13:12:33 -------------------------------------------------------------------------------- Physician Orders Details Patient Name: Date of Service: [name] 06/01/2019 11:00 AM Medical Record [id number] Patient Account Number: [id number] Date of Birth/Sex: Treating RN: [birth year] (73 y.o. [name] Primary Care Provider: Chrisandra Netters Other Clinician: Referring Provider: Treating Provider/Extender:Harlee Pursifull, Nonda Lou, Ivan Anchors in Treatment: 106 Verbal / Phone Orders: No Diagnosis Coding ICD-10 Coding Code Description E11.42 Type 2 diabetes mellitus with diabetic polyneuropathy L97.518 Non-pressure chronic ulcer of other part of right foot with other specified severity L97.521 Non-pressure chronic ulcer of other part of left foot limited to breakdown of skin L02.612 Cutaneous abscess of left foot S80.812D Abrasion, left lower leg, subsequent encounter Follow-up Appointments Wound #19 Left,Plantar Foot Return Appointment in 1 week. Dressing Change Frequency Wound #19 Left,Plantar Foot Other: - Change 2 times per week Skin Barriers/Peri-Wound Care Wound #19 Left,Plantar Foot Moisturizing lotion - to both legs Wound Cleansing Wound #19 Left,Plantar Foot Clean wound with Wound Cleanser Primary Wound Dressing Wound #19 Left,Plantar Foot Calcium Alginate Secondary Dressing Wound #19 Left,Plantar Foot Foam Dry Gauze Edema Control Wound #19 Left,Plantar  Foot Kerlix and Coban - Left Lower Extremity Avoid standing for long periods of time Elevate legs to the level of the heart or above for 30 minutes daily and/or when sitting, a frequency of: - throughout the day Support Garment 20-30 mm/Hg pressure to: - juxtalite to right leg Off-Loading Open toe surgical shoe to: - left foot with felt. [facility] nursing for wound care. Lajean Manes Electronic Signature(s) Signed: 06/01/2019 5:42:33 PM By: Linton Ham MD Signed: 07/27/2019 3:01:15 PM By: Carlene Coria RN Entered By: Carlene Coria on 06/01/2019 11:51:14 -------------------------------------------------------------------------------- Problem List Details Patient Name: Date of Service: [name] 06/01/2019 11:00 AM Medical Record [id number] Patient Account Number: [id number] Date of Birth/Sex: Treating RN: 12-Jul-1946 (73 y.o. [name] Primary Care Provider: Chrisandra Netters Other Clinician: Referring Provider: Treating Provider/Extender:Jeilani Grupe, Nonda Lou, Ivan Anchors in Treatment: 47 Active Problems ICD-10 Evaluated Encounter Code Description Active Date Today Diagnosis E11.42 Type 2 diabetes mellitus with diabetic polyneuropathy 07/03/2018 No Yes L97.518 Non-pressure chronic ulcer of other part of right foot 07/20/2018 No Yes with other specified severity L97.521 Non-pressure chronic ulcer of other part of left foot 08/15/2018 No Yes limited to breakdown of skin L02.612 Cutaneous abscess of left foot 07/20/2018 No Yes S80.812D Abrasion, left lower leg, subsequent encounter 05/17/2019 No Yes Inactive Problems ICD-10 Code Description Active Date Inactive Date L97.511 Non-pressure chronic ulcer of other part of right foot limited to 07/03/2018 07/03/2018 breakdown of skin E11.52 Type 2 diabetes mellitus with diabetic peripheral angiopathy 07/20/2018 07/20/2018 with gangrene Resolved Problems ICD-10 Code Description  Active Date Resolved Date S81.811D Laceration without foreign body, right lower leg, subsequent 07/03/2018 07/03/2018 encounter L97.821 Non-pressure chronic  ulcer of other part of left lower leg limited 07/03/2018 07/03/2018 to breakdown of skin L97.211 Non-pressure chronic ulcer of right calf limited to breakdown of 01/12/2019 01/12/2019 skin Electronic Signature(s) Signed: 06/01/2019 5:42:33 PM By: Linton Ham MD Entered By: Linton Ham on 06/01/2019 13:09:24 -------------------------------------------------------------------------------- Progress Note Details Patient Name: Date of Service: [name] 06/01/2019 11:00 AM Medical Record [id number] Patient Account Number: [id number] Date of Birth/Sex: Treating RN: 07/30/1946 (73 y.o. [name] Primary Care Provider: Chrisandra Netters Other Clinician: Referring Provider: Treating Provider/Extender:Fredda Clarida, Nonda Lou, Ivan Anchors in Treatment: 47 Subjective History of Present Illness (HPI) 73 year old patient who is known to our practice from May of last year was fully worked up with a venous and arterial duplex study and was referred to the vascular surgeon for follow-up. The patient says she has seen the vascular surgeons and has an appointment back in April. No procedure was done. She has developed a blister on the left big toe and forefoot which has been fairly large and draining fluid and she self-referred herself to [place]. The patient was recently seen in the ER on 10/06/2016, and was treated with clindamycin and asked to see the podiatrist and PCP. She saw the podiatrist Dr. Felisa Bonier on 10/11/2016 who I understand did an x-ray but no report is available. Of note last year when I saw her, I had referred her to do Dr. Ruta Hinds -- he saw on 02/04/2016. he reviewed and noted that she had evidence of peripheral arterial disease and recommended a follow-up in 6 months for repeat noninvasive arterial  exam. Wounds worsened he would consider an angiogram. He saw her back on 03/14/2016, and continued conservative treatment and asked her to come back for noninvasive studies in 6 months. 12/03/2016 -- was seen in the office on 11/28/2016 and note is made of the fact that venous duplex examination on 01/02/2016 showed no evidence of reflux. Did have evidence of peripheral arterial disease and was asked to follow- up in 6 months time. Recent data on 11/28/2016 showed right noncompressible with monophasic waveforms left noncompressible with monophasic waveforms. The thought was she has medial calcifications of the arteries and had bilateral toe brachial indices which were normal. Right toe brachial index was 0.74 and the left toe brachial index was 0.80 She would return for a vascular study in 6 months when her ABI would be checked again. 7/6-Patient is back at 1 week visit to the clinic, home health is attending to the wound once a week, we have been using silver alginate, MRI scheduled this week on Thursday for the left foot. We are seeing her for the left foot plantar ulcer and the right great toe ulcer 12/24/2016 -- the left plantar heel had a large callus which came off during her evaluation and there was an open ulcer at the base ==== Old notes She was recently seen by her PCP for an ulcer of the left toe which was treated at the urgent care by giving her ciprofloxacin which she has been taking. She has uncontrolled diabetes and A1c was 13.7 done last week. Past medical history significant for poorly controlled diabetes mellitus type 2, obesity, osteopenia, GERD, diabetic retinopathy, diabetic neuropathy, reported arthritis, hypothyroidism, hypertension. No x-rays were done recently. 12/27/2015 -- had an x-ray of the left foot --IMPRESSION:Juxta-articular erosions at the IP joint great toe and diffusely in the MTP joints greatest at first, suggesting an inflammatory arthropathy such as  rheumatoid arthritis or gout.While septic arthritis can cause juxta-articular erosions,  the multiplicity of joints involved makes this less likely. Significant soft tissue swelling with single tiny questionable focus of soft tissue gas medial to the first metatarsal head. If patient has persistent symptoms or persistent clinical concern for osteomyelitis, recommend MR imaging of the LEFT foot (with and without contrast if renal function permits). 01/03/2016 -- the lower extremity venous duplex reflex evaluation showed no evidence of deep or superficial thrombus or reflux. The arterial study done and showed the resting right ABI is greater than 1.3 and the left ABI was greater than 1.3 indicating arterial medial calcification. The right TBI was less than 0.64 and the left ABI was less than 0.64 both of which are abnormal. except for the left posterior tibial which is triphasic on the flow is biphasic. With the above results due to the fact that she has diabetes mellitus and would recommend she sees the vascular surgeon to see if any further intervention or procedure needs to be planned. ====== READMISSION 09/19/17;this is a 73 year old woman that we've had in our clinic previously for wounds on predominantly her left foot left toe and left heel. She is a diabetic. She has had lower extremity workups for both venous reflux disease and arterial disease. She has known noncompressible vessels bilaterally however in May 2017 as noted above her TBI was 0.64 and the left TBI was less than 0.64. When she was last in clinicin May 2017 the left heel wound had closed. She was faithfully wearing her juxta light stockings. She tells me 2-3 weeks ago she developed increasing edema in her legs. She was seen by Dr. Valentino Saxon of nephrology who apparently increased her Lasix from 40-80 mg twice a day. The patient has not noticed any improvement in the edema. She does not weigh herself. About a week ago she noted  blisters on her legs on the right o2 on the left o1 since then she has not been wearing the juxta light stockings. She has not had any pain no shortness of breath 09/26/17;; the patient has a stable wound as a result of blistering on the left anterior calf and 2 on the right anterior and right medial. There is another denuded blister on the left anterior more superior. This does not have any current fluid I did not remove the skin today. We've been using silver alginate under compression 10/03/17; the patient's right leg is healed. The area on the left anterior leg all wound sites look better. We've been using silver alginate under compression. In questioning the patient apparently has a juxta light stockings however she has not been using these. We asked her to find them lubricate her skin every night and used a juxta light stocking on the right. I would like to see this next time 10/10/17; both the patient's areas on the left anterior leg o2 are closed the area on the look right posterior medial calf remains closed. She is been using a juxta light stocking on the right she has one for the left Readmission Mrs. Joya Gaskins is now a 74 year old woman we have had in this clinic at least 3 times before. Most recently she was here in February 2019 for 2 visits with wounds on her bilateral lower extremities because of blisters these healed fairly quickly and we discharged her in juxta lite stockings. She was previously here in 2018 with a wound on her left foot and in 2017 she was sent to see Dr. Oneida Alar for follow-up of PAD. Indeed she seems to have had follow-up  noninvasive vascular studies every 6 months. Her current problems began at the end of October she apparently suffered a fall with a laceration on her right anterior leg. She had this sutured on 06/23/2018. Sometime in the same timeframe she developed blistering over a large area of the left anterior calf, the right great toe. The blisters have  spontaneously ruptured and she has large areas of the epithelial loss. She also has non-open blisters on the medial part of the left great toe. There is also an open area on the right second toe. I do not believe that she was using her compression stockings [not juxta lites] because the swelling in her legs had gotten too large for her to wrap. She tells me she is recently been to her nephrologist and had her Lasix increased which is helped somewhat with the swelling. The patient did not have arterial studies done in our clinic. Her most recent noninvasive studies were in August. These showed ABIs noncompressible bilaterally. She had TBI's on the right at 0.54 and on the left at 0.60 biphasic waveforms at the PTA and DP bilaterally. She was felt to have bilateral ABIs that were unchanged from her previous study in October 2018. However it was noted her TBI's were decreased. Past medical history includes type 2 diabetes with peripheral neuropathy with a recent hemoglobin A1c of 7. She is apparently off her diabetes medications, she has stage III chronic renal failure, hyperlipidemia, hypertension, cataracts, retinopathy, rheumatoid arthritis, iron deficiency and apparently is receiving IV iron, urge incontinence 07/10/2018; she arrives today with everything looking quite a bit better. The large area of blistered denuded epithelium on the left calf has healed over. The area on the right lateral tibial area is also mostly healed still with a linear open area where her laceration was. The area on her left medial toe is just about healed. She has a dark thick black eschar over the tip of her right first toe. I do not remember this from last week this feels almost like the eschar you would see with ischemia yet her toes are warm and her peripheral pulses are palpable. She does have some degree of PAD but I do not think that was sufficient enough to have caused this. 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 Patient originally came here with a laceration injury of her right lateral mid calf. She also had an area of denuded skin on the left which is since closed over. She had a small area on the tip of her right great toe as well. She arrives today with what looks to be dry gangrene on the right great toe as well as discoloration of the tips of her second and third toes. She is not in any pain. She also had purulent drainage coming out open area on her left midfoot which was new this week. Culture was obtained The patient is known to vascular surgery having last been seen on 04/02/2018. They follow her for chronic arterial insufficiency. Last arterial studies were in August 2017 at which time her ABIs were noncompressible however her TBI was 0.54 on the right and 0.60 on the left. She was noted to have bilateral ankle arteries remain calcified. Waveforms biphasic. Slight decline in bilateral TBI's 07/24/2018; the patient was graciously seen urgently by vein and vascular with regards to critical limb ischemia, she had a CO2 angiogram and then contrast to look at the lower extremity arteries. Surprisingly no significant  vascular disease was noted in the aortic iliac set segment, patent common femoral and profunda arteries patent popliteal and three-vessel runoff with all 3 of the tibials present. Her dorsalis pedis and plantar arch vessels also filled. Notable for the fact that it was felt that she had small vessel disease near her toes but this was not amenable for intervention. The abscess that I unroofed last week on the plantar left foot grew Stenotrophomonas Maltophilia. This is not a usual true wound pathogen. However it had abundant organisms and given the fact that is an abscess I went ahead and treated this with Levaquin 250 daily for 7 days. I had her on doxycycline last week 07/31/2018; I put 3 layer compression on the right leg last week. She  arrives in today with almost no edema in the right leg and out of concern for the this in the gangrenous toes I am going to reduce this back to Kerlix and Coban which is what we had her on at the beginning. We are using silver alginate to the traumatic wound on her right anterior leg and this looks a lot better. The area on the left foot is healed and the patient is using her own wraparound compression stocking here. She still has the dry gangrene-like changes predominantly involving the right first toe, tip of her right second and third toes. We are using Betadine here. The exact etiology of this was not completely clear 08/07/18; we've been using 2 layer compression on the right leg. She still has the gangrenous toes for second and third on the right and the original laceration injury. She does not have any significant vascular issues to the level of the ankle. She does have small vessel disease in her toes.The left mid foot wound is closed 08/14/18; the patient arrives in clinic today with improvement in the original area on her right lateral leg. The 3 dry gangrenous toes first second and third on the right all look stable except for the second. This was obviously separating. It was removed fortunately there is healthy-looking surface tissue underneath this. ooShe arrives in clinic with a reopening on the left midfoot. This was previously a small abscess that had closed. She has Charcot feet bilaterally 08/24/2018; right lateral leg wound is smaller. The 3 dry gangrenous first second and third toes are a bit changed. The third toe was separating I remove the eschar and subcutaneous tissue to reveal a small open area at the tip of the toe just at the base at the head of the nail. The first toe is unchanged. ooLeft plantar foot is larger. This is in the Charcot foot area. I removed nonviable circumference and surface from the wound using silver alginate 08/31/2018; right lateral leg wound  continues to contract also the area on her plantar foot. First great toe is beginning to separate and the nail will soon I think fall off.. She sees vein and vascular tomorrow. 1/20; right lateral leg wound continues to contract and is almost closed first great toe is beginning to separate. The area on her left plantar foot still has rolled edges around the wound and some debris in the surface but generally smaller. We have been using silver alginate to the 2 remaining wound areas and Betadine to the left great toe. The patient saw vein and vascular on 1/14. They did not add anything here. Noted that the patient has microvascular disease but no major macrovascular issues. No intervention was performed during her CO2 aortogram on  07/23/2018. 2/3; 2-week follow-up. Right leg wound which is her original wound and coming here is healed. Second and third right toes have healed. She still has dry eschared/dry gangrene over the right first toe. I thought this might begin to separate but it really is not making great headway with that. The area on the left midfoot reopened 2 to 3 weeks ago and this is made no progress. Finally she has new blisters on the left first toe laterally and the left second toe dorsally over the DIP at the base of the nail 2/17; 2-week follow-up. Right leg wound which is her original wound is healed. Right second and third toes are still healed. She still has the black ischemic eschar over the first toe which has not really begun to separate. The area on the left midfoot is unfortunately worse with large amounts of denuded skin around this. She has blisters on the medial part of her first toe and the dorsal part of the second toe on the left 2/24; she still has the black ischemic eschar over the first toe which is not really separating. The area on the left midfoot seems stable from last week. The blisters on the medial part of her first toe and dorsal second toe had hardened and I  removed both of these there are small open areas underneath. 3/2; left first and second toes are healed. The area on the left plantar foot looks better. Finally the right great toe area is beginning to separate. 3/9; left first and second toes remain healed. Left plantar foot continues to look excellent smaller with a healthy base. Finally the right great toe had separated further. I went ahead and remove the eschar here. There is still a fairly sizable wound here but this will give [place] a better chance to address this. 3/16-Left first and second toes mostly healed although the left first toe appears to have an open area Right great toe deep ulcer with exposed bone, greenish debris with necrotic tissue, right second toe wound with clean edges and base. Patient had silver alginate dressing to all the toes especially on the right. SHe has been offloading with inserts and open toe shoes 3/23; since I last saw this patient 2 weeks ago there is been quite a bit of deterioration. The left plantar foot continues to look about the same. She now has an area on the left lateral first toe. On the right foot the first toe has a fair amount of exposed bone. X-ray that we did last week did not show osteomyelitis nevertheless that would have to be a concern. She also has an area on the tip of the right second toe that is new 3/30; some improvement in the left plantar foot. Bone I took out of the right first toe last week did not show osteomyelitis under pathology however culture did grow rare staph aureus and rare Citrobacter. We have been using silver alginate to all her wounds 4/6; left plantar foot wound about the same although this does not look ominous. The right first toe which is been very problematic does not have exposed bone aggressive debridement of the surface of this. She does have grams of epithelialization. We have been using silver alginate on both wounds 4/13; left plantar foot wound about the  same. Removed skin and subcutaneous tissue from the wound circumference. ooThe right great toe has underlying osteomyelitis I have extended her Levaquin 500 mg for another 2 weeks. She does not complain of any  overt side effects. 4/23;. The patient has wounds on the left plantar foot, left lateral great toe and a new area on the dorsal part of the right second toe. Her large area over the tip of her right great toe. She is on Levaquin I will need to check on when we want to finish this. This is for underlying osteomyelitis of the right great toe 5/4; the patient has or should be completing her Levaquin at this point. This is for osteomyelitis in the right great toe. She also has wounds on her left plantar foot and the dorsal part of her right second toe. We have been using silver alginate 5/11; patient arrives today with her right great toe generally looking better. However the area on the plantar left foot had considerable undermining and discomfort around the wound and some erythema. We have been using silver alginate 5/18-Patient returns at 1 week the right great toe is much more macerated around the edges of the wound, the left foot wounds are considerably better especially the left great toe and left second toe. We have been using silver alginate to all wounds and juxta lite to the right leg and 2 layer compression to the left. THere is now a fluid filled bleb on the posterior calf of RLE 5/26; right great toe continues to improve. The underlying osteomyelitis was treated empirically in this clinic. ooLeft midfoot wound also looks better than the last time I saw this 2 weeks ago. ooApparently last week she had a blister on the right posterior calf that was largely. This was not specifically addressed. It is open since last time she has a large area on the right posterior mid calf with skin attached to a large amount of the circumference. 6/1; we continue to have true improvements in  both of the wound areas on the right great toe and left plantar foot. The large blister on the posterior right calf is just about fully epithelialized although there is still some weeping edema in this area 6/8; we continued to have been improvements in both areas currently on the right great toe and left plantar foot. The large blister on the posterior right calf is fully healed. 6/15; we we continue to have improvements in the right great toe. The left plantar foot which was a small wound without undermining last week has decided to expand into the lateral foot. There is some swelling in this area. She is not systemically unwell 6/22; the culture of her left foot that I did last week grew Enterobacter I had her on doxycycline. There was also clearly some purulent drainage coming from this today through the original wound with the swelling on the lateral part of her foot. She is not systemically unwell. 6/25 the patient is on Levaquin for the Enterobacter cultured out of her left foot. She now has an extensive wound area in the medial left foot we are using silver alginate The patient complains of pain in the foot going up towards the metatarsal head. There is also swelling inferiorly to this area that we identified last week but this is not tender 6/29; the patient is completing her Levaquin for the Enterobacter. We are using silver alginate to the extensive wound on the left plantar foot as well as to the right great toe 7/14; the right great toe is healed today. The area on the left foot looks better. The MRI of the left foot did not show osteomyelitis in the area of the wound however  it did show an effusion in the tibiotalar joint. The patient has rheumatoid arthritis 7/20; right great toe remains healed. The area on the left foot continues to look improved. 7/27 right great toe remains healed. The area on the left foot continues to contract. Healthy granulation tissue she is only  offloading this in surgical shoes. 03/24/2019 on evaluation today patient appears to be doing very well with regard to her plantar foot ulcer. She has been tolerating the dressing changes without complication. She has a healthy granulation surface. There is no signs of active infection at this time and her swelling seems to be under good control. 8/17; the areas on the left medial midfoot. Probably subluxed bone in this area. Wounds are superficial. It looks as though there was some blistering skin at one point 8/24; the area on the left medial midfoot. This is completely resolved at this point although it still looks somewhat vulnerable. She probably has subluxed bone underneath this area. She has pes planus deformity 9/14; apparently this wound reopened in the 3 weeks since I saw her. She was seen last week she is given new healing sandals. Been using calcium alginate with felt offloading. This is not surprising given the area looks somewhat vulnerable. 9/28; left plantar foot in the setting of diabetic neuropathy probably some degree of Charcot deformity. She also has abrasions on the left leg which she says are due to the Curlex co-band that was put on by home health. She is also having a lot of pain in the left calf finds the wrap too tight 10/5; left plantar foot has 2 superficial open areas. The abrasions on the left leg from last week are healed. She still has some swelling and tenderness however. Duplex ultrasound of the leg tomorrow tomorrow. 10/13; patient arrived with a deterioration in the left foot from last week. Undermining wound probably connecting the 2 superficial areas from last week. She wears a surgical shoe with felt offloading. Duplex ultrasound she had last week showed a ruptured Baker's cyst. Her leg looks a lot better Objective Constitutional Patient is hypertensive.. Pulse regular and within target range for patient.Marland Kitchen Respirations regular, non-labored and within target  range.. Temperature is normal and within the target range for the patient.Marland Kitchen Appears in no distress. Vitals Time Taken: 11:36 AM, Height: 62 in, Weight: 140 lbs, BMI: 25.6, Temperature: 97.7 F, Pulse: 79 bpm, Respiratory Rate: 17 breaths/min, Blood Pressure: 159/71 mmHg. General Notes: Wound exam; there is no open wound in the right leg or foot. The left leg has no open areas. There is still the original area on the lateral part of her left foot. She has a Charcot foot there is probably some subluxation of the cuboid bone. Debrided with a #5 curette removing eschar skin and some subcutaneous debris. Hemostasis with silver nitrate Integumentary (Hair, Skin) Wound #19 status is Open. Original cause of wound was Gradually Appeared. The wound is located on the [place]. The wound measures 0.4cm length x 0.7cm width x 0.2cm depth; 0.22cm^2 area and 0.044cm^3 volume. There is Fat Layer (Subcutaneous Tissue) Exposed exposed. There is undermining starting at 1:00 and ending at 4:00 with a maximum distance of 0.7cm. There is a medium amount of serosanguineous drainage noted. The wound margin is flat and intact. There is large (67-100%) red, pink granulation within the wound bed. There is no necrotic tissue within the wound bed. 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 There was a Excisional Skin/Subcutaneous Tissue Debridement with a total area of 0.28 sq cm performed by Ricard Dillon., MD. With the  following instrument(s): Curette to remove Viable and Non-Viable tissue/material. Material removed includes Callus, Subcutaneous Tissue, and Slough after achieving pain control using Lidocaine 5% topical ointment. No specimens were taken. A time out was conducted at 12:22, prior to the start of the procedure. A Moderate amount of bleeding was controlled with Silver Nitrate. The procedure was tolerated well with a pain level of 0 throughout and a pain level of 0 following the procedure. Post Debridement Measurements: 0.4cm length x 0.7cm width x 0.2cm depth; 0.044cm^3 volume. Character of Wound/Ulcer Post Debridement is improved. Severity of Tissue Post Debridement is: Fat layer exposed. Post procedure Diagnosis Wound #19: Same as Pre-Procedure Plan Follow-up Appointments: Wound #19 Left,Plantar Foot: Return Appointment in 1 week. Dressing Change Frequency: Wound #19 Left,Plantar Foot: Other: - Change 2 times per week Skin Barriers/Peri-Wound Care: Wound #19 Left,Plantar Foot: Moisturizing lotion - to both legs Wound Cleansing: Wound #19 Left,Plantar Foot: Clean wound with Wound Cleanser Primary Wound Dressing: Wound #19 Left,Plantar Foot: Calcium Alginate Secondary Dressing: Wound #19 Left,Plantar Foot: Foam Dry Gauze Edema Control: Wound #19 Left,Plantar Foot: Kerlix and Coban - Left Lower Extremity Avoid standing for long periods of time Elevate legs to the level of the heart or above for 30 minutes daily and/or when sitting, a frequency of: - throughout the day Support Garment 20-30 mm/Hg pressure to: - juxtalite to right leg Off-Loading: Open toe surgical shoe to: - left foot with felt. Home Health: [facility] for wound care. - [facility] 1. Silver alginate to continue. Curlex and Coban in the left lower extremity 2. There is not a more aggressive way to offload this in this frail patient Electronic Signature(s) Signed: 06/01/2019 5:42:33 PM By:  Linton Ham MD Entered By: Linton Ham on 06/01/2019 13:13:12 -------------------------------------------------------------------------------- SuperBill Details Patient Name: Date of Service: [name] 06/01/2019 Medical Record [id number] Patient Account Number: [id number] Date of Birth/Sex: Treating RN: 01-30-1946 (73 y.o. [name] Primary Care Provider: Chrisandra Netters Other Clinician: Referring Provider: Treating Provider/Extender:Amelianna Meller, Nonda Lou, Ivan Anchors in Treatment: 47 Diagnosis Coding ICD-10 Codes Code Description E11.42 Type 2 diabetes mellitus with diabetic polyneuropathy L97.518 Non-pressure chronic ulcer of other part of right foot with other specified severity L97.521 Non-pressure chronic ulcer of other part of left foot limited to breakdown of skin L02.612 Cutaneous abscess of left foot S80.812D Abrasion, left lower leg, subsequent encounter Facility Procedures The patient participates with Medicare or their insurance follows the Medicare Facility Guidelines: CPT4 Code Description Modifier Quantity 54270623 11042 - DEB SUBQ TISSUE 20 SQ CM/< 1 ICD-10 Diagnosis Description L97.521 Non-pressure chronic ulcer of  other part of left foot limited to breakdown of skin Physician Procedures CPT4 Code Description: 7628315 11042 - WC PHYS SUBQ TISS 20 SQ CM ICD-10 Diagnosis Description L97.521 Non-pressure chronic ulcer of other part of left foot limited Modifier: to breakdown Quantity: 1 of skin Electronic Signature(s) Signed: 06/01/2019 5:42:33 PM By: Linton Ham MD Entered By: Linton Ham on 06/01/2019 13:13:31

## 2019-07-27 NOTE — Progress Notes (Signed)
[name], [name] ([id number]) Visit Report for 05/03/2019 Arrival Information Details Patient Name: Date of Service: Kari, Keller 05/03/2019 1:00 PM Medical Record [id number] Patient Account Number: [id number] Date of Birth/Sex: Treating Keller: [birth year] (73 y.o. [name], [name] Primary Care [name]: [name] Other Clinician: Sandre Keller Referring [name]: Treating [name]/Extender:[name] in Treatment: 27 Visit Information History Since Last Visit Added or deleted any medications: No Patient Arrived: [name] Any new allergies or adverse reactions: No Arrival Time: 13:08 Had a fall or experienced change in No Accompanied By: self activities of daily living that may affect Transfer Assistance: None risk of falls: Patient Requires Transmission-Based No Signs or symptoms of abuse/neglect since last No Precautions: visito Patient Has Alerts: Yes Hospitalized since last visit: No Patient Alerts: right non Implantable device outside of the clinic excluding No compressable cellular tissue based products placed in the center since last visit: Has Dressing in Place as Prescribed: Yes Pain Present Now: Yes Electronic Signature(s) Signed: 07/27/2019 3:04:24 PM By: [name] Entered By: [name] on 05/03/2019 13:16:40 -------------------------------------------------------------------------------- Encounter Discharge Information Details Patient Name: Date of Service: [name]. 05/03/2019 1:00 PM Medical Record [id number] Patient Account Number: [id number] Date of Birth/Sex: Treating Keller: Oct 24, 1945 (73 y.o. [name] Primary Care [name]: [name] Other Clinician: Sandre Keller Referring [name]: Treating Jamey Demchak/Extender:[name] in Treatment: 66 Encounter Discharge Information Items Post Procedure Vitals Discharge Condition: Stable Temperature  (F): 97.7 Ambulatory Status: Walker Pulse (bpm): 79 Discharge Destination: Home Respiratory Rate (breaths/min): 20 Transportation: Private Auto Blood Pressure (mmHg): 153/69 Accompanied By: self Schedule Follow-up Appointment: Yes Clinical Summary of Care: Electronic Signature(s) Signed: 05/03/2019 5:18:15 PM By: [name] Entered By: [name] on 05/03/2019 17:10:01 -------------------------------------------------------------------------------- Lower Extremity Assessment Details Patient Name: Date of Service: Kari, Keller. 05/03/2019 1:00 PM Medical Record [id number] Patient Account Number: [id number] Date of Birth/Sex: Treating Keller: [birth year] (73 y.o. [name] Primary Care Araiya Tilmon: [name] Other Clinician: Sandre Keller Referring [name]: Treating [name]/Extender:[name] in Treatment: 43 Edema Assessment Assessed: [Left: No] [Right: No] Edema: [Left: Yes] [Right: No] Calf Left: Right: Point of Measurement: 31 cm From Medial Instep 34 cm cm Ankle Left: Right: Point of Measurement: 11.5 cm From Medial Instep 24.5 cm cm Electronic Signature(s) Signed: 07/27/2019 3:04:22 PM By: [name] Entered By: [name] on 05/03/2019 13:35:05 -------------------------------------------------------------------------------- Multi Wound Chart Details Patient Name: Date of Service: [name]. 05/03/2019 1:00 PM Medical Record [id number] Patient Account Number: [id number] Date of Birth/Sex: Treating Keller: 03-Aug-1946 (73 y.o. [name], [name] Primary Care [name]: [name] Other Clinician: Sandre Keller Referring [name]: Treating [name]/Extender:[name] in Treatment: 36 Vital Signs Height(in): 62 Pulse(bpm): 94 Weight(lbs): 140 Blood Pressure(mmHg): 153/69 Body Mass Index(BMI): 26 Temperature(F):  97.7 Respiratory 16 Rate(breaths/min): Photos: [19:No Photos] [N/A:N/A] Wound Location: [19:Left Foot - Plantar] [N/A:N/A] Wounding Event: [19:Gradually Appeared] [N/A:N/A] Primary Etiology: [19:Diabetic Wound/Ulcer of the N/A Lower Extremity] Comorbid History: [19:Cataracts, Hypertension, N/A Type II Diabetes, Rheumatoid Arthritis] Date Acquired: [19:08/07/2018] [N/A:N/A] Weeks of Treatment: [19:37] [N/A:N/A] Wound Status: [19:Open] [N/A:N/A] Measurements L x W x D 0.5x0.5x0.1 [N/A:N/A] (cm) Area (cm) : [19:0.196] [N/A:N/A] Volume (cm) : [19:0.02] [N/A:N/A] % Reduction in Area: [19:16.90%] [N/A:N/A] % Reduction in Volume: 57.40% [N/A:N/A] Classification: [19:Grade 2] [N/A:N/A] Exudate Amount: [19:Medium] [N/A:N/A] Exudate Type: [19:Serosanguineous] [N/A:N/A] Exudate Color: [19:red, brown] [N/A:N/A] Wound Margin: [19:Flat and Intact] [N/A:N/A] Granulation Amount: [19:Large (67-100%)] [N/A:N/A] Granulation Quality: [19:Red, Pink] [N/A:N/A] Necrotic Amount: [19:Small (1-33%)] [  N/A:N/A] Exposed Structures: [19:Fat Layer (Subcutaneous N/A Tissue) Exposed: Yes Fascia: No Tendon: No Muscle: No Joint: No Bone: No] Epithelialization: [19:Large (67-100%)] [N/A:N/A] Debridement: [19:Debridement - Selective/Open Wound] [N/A:N/A] Pre-procedure [19:13:45] [N/A:N/A] Verification/Time Out Taken: Pain Control: [19:Lidocaine 4% Topical Solution] [N/A:N/A] Tissue Debrided: [19:Necrotic/Eschar] [N/A:N/A] Level: [19:Skin/Dermis] [N/A:N/A] Debridement Area (sq cm):0.25 [N/A:N/A] Instrument: [19:Curette] [N/A:N/A] Bleeding: [19:Minimum] [N/A:N/A] Hemostasis Achieved: [19:Pressure] [N/A:N/A] Procedural Pain: [19:0] [N/A:N/A] Post Procedural Pain: [19:0] [N/A:N/A] Debridement Treatment Procedure was tolerated [N/A:N/A] Response: [19:well] Post Debridement [19:0.5x0.5x0.1] [N/A:N/A] Measurements L x W x D (cm) Post Debridement [19:0.02] [N/A:N/A] Volume: (cm) Procedures Performed:  [19:Debridement] [N/A:N/A] Treatment Notes Electronic Signature(s) Signed: 05/03/2019 5:18:15 PM By: [name] Signed: 05/04/2019 9:27:38 AM By: [name] Entered By: Kari Ham on 05/03/2019 14:04:31 -------------------------------------------------------------------------------- Multi-Disciplinary Care Plan Details Patient Name: Date of Service: [name]. 05/03/2019 1:00 PM Medical Record [id number] Patient Account Number: [id number] Date of Birth/Sex: Treating Keller: 05-Aug-1946 (73 y.o. [name], Tammi Klippel Primary Care Gal Smolinski: [name] Other Clinician: Sandre Keller Referring Tanesia Butner: Treating Corayma Cashatt/Extender:[name] in Treatment: 43 Active Inactive Wound/Skin Impairment Nursing Diagnoses: Impaired tissue integrity Knowledge deficit related to ulceration/compromised skin integrity Goals: Patient/caregiver will verbalize understanding of skin care regimen Date Initiated: 07/03/2018 Target Resolution Date: 05/21/2019 Goal Status: Active Ulcer/skin breakdown will have a volume reduction of 30% by week 4 Date Initiated: 07/03/2018 Date Inactivated: 07/31/2018 Target12/13/2019 Resolution Date: Unmet Reason: necrotic Goal Status: Unmet toes, had vascular procedure Ulcer/skin breakdown will have a volume reduction of 50% by week 8 Date Initiated: 07/31/2018 Date Inactivated: 08/31/2018 Target Resolution Date: 09/04/2018 Goal Status: Met Ulcer/skin breakdown will have a volume reduction of 80% by week 12 Date Initiated: 08/31/2018 Date Inactivated: 10/05/2018 Target Resolution Date: 10/02/2018 Goal Status: Unmet Unmet Reason: PAD Interventions: Assess patient/caregiver ability to obtain necessary supplies Assess patient/caregiver ability to perform ulcer/skin care regimen upon admission and as needed Assess ulceration(s) every visit Provide education on ulcer and skin care Treatment Activities: Patient  referred to home care : 07/03/2018 Skin care regimen initiated : 07/03/2018 Topical wound management initiated : 07/03/2018 Notes: Electronic Signature(s) Signed: 05/03/2019 5:18:15 PM By: [name] Entered By: [name] on 05/03/2019 17:08:12 -------------------------------------------------------------------------------- Pain Assessment Details Patient Name: Date of Service: Kari, [place]. 05/03/2019 1:00 PM Medical Record [id number] Patient Account Number: [id number] Date of Birth/Sex: Treating Keller: 20-Jan-1946 (73 y.o. [name] Primary Care Colm Lyford: [name] Other Clinician: Sandre Keller Referring Aubrea Meixner: Treating Yuvaan Olander/Extender:[name] in Treatment: 46 Active Problems Location of Pain Severity and Description of Pain Patient Has Paino Yes Site Locations Rate the pain. Worst Pain Level: 5 Pain Management and Medication Current Pain Management: Electronic Signature(s) Signed: 05/03/2019 5:18:15 PM By: [name] Signed: 07/27/2019 3:04:24 PM By: [name] Entered By: [name] on 05/03/2019 13:16:31 -------------------------------------------------------------------------------- Patient/Caregiver Education Details Patient Name: Date of Service: [name], Kari M. 9/14/2020andnbsp1:00 PM Medical Record [id number] Patient Account Number: [id number] Date of Birth/Gender: Oct 22, 1945 (73 y.o. F) Treating Keller: [name] Primary Care Physician: [name] Other Clinician: Sandre Keller Referring Physician: Treating Physician/Extender:[name] in Treatment: 50 Education Assessment Education Provided To: Patient Education Topics Provided Wound/Skin Impairment: Handouts: Caring for Your Ulcer Methods: Explain/Verbal Responses: Reinforcements needed Electronic Signature(s) Signed: 05/03/2019 5:18:15 PM By: [name] Entered By: [name] on 05/03/2019 17:08:25 -------------------------------------------------------------------------------- Wound Assessment Details Patient Name: Date of Service: Kari, [place]. 05/03/2019 1:00 PM Medical Record [id number] Patient Account Number: [id number] Date of Birth/Sex: Treating Keller: 1945-11-09 (73 y.o.  [name], Tammi Klippel Primary Care Derris Millan: [name] Other Clinician: Sandre Keller Referring Monchel Pollitt: Treating Jariah Jarmon/Extender:[name] in Treatment: 43 Wound Status Wound Number: 19 Primary Diabetic Wound/Ulcer of the Lower Etiology: Extremity Wound Location: Left Foot - Plantar Wound Open Wounding Event: Gradually Appeared Status: Date Acquired: 08/07/2018 Comorbid Cataracts, Hypertension, Type II Diabetes, Weeks Of Treatment: 37 History: Rheumatoid Arthritis Clustered Wound: No Photos Wound Measurements Length: (cm) 0.5 Width: (cm) 0.5 Depth: (cm) 0.1 Area: (cm) 0.196 Volume: (cm) 0.02 Wound Description Classification: Grade 2 Wound Margin: Flat and Intact Exudate Amount: Medium Exudate Type: Serosanguineous Exudate Color: red, brown Wound Bed Granulation Amount: Large (67-100%) Granulation Quality: Red, Pink Necrotic Amount: Small (1-33%) Necrotic Quality: Adherent Slough r After Cleansing: No ibrino Yes Exposed Structure posed: No (Subcutaneous Tissue) Exposed: Yes posed: No posed: No osed: No sed: No % Reduction in Area: 16.9% % Reduction in Volume: 57.4% Epithelialization: Large (67-100%) Tunneling: No Undermining: No Foul Odo Slough/F Fascia Ex Fat Layer Tendon Ex Muscle Ex Joint Exp Bone Expo Electronic Signature(s) Signed: 05/04/2019 4:42:49 PM By: Mikeal Hawthorne EMT/HBOT Signed: 05/04/2019 6:35:32 PM By: [name] Previous Signature: 05/03/2019 5:18:15 PM Version By: [name] Entered By: Mikeal Hawthorne on 05/04/2019  09:46:31 -------------------------------------------------------------------------------- Vitals Details Patient Name: Date of Service: [name]. 05/03/2019 1:00 PM Medical Record [id number] Patient Account Number: [id number] Date of Birth/Sex: Treating Keller: [birth year] (73 y.o. [name], Tammi Klippel Primary Care Haeven Nickle: [name] Other Clinician: Sandre Keller Referring Marcellene Shivley: Treating Shamell Suarez/Extender:[name] in Treatment: 43 Vital Signs Time Taken: 13:16 Temperature (F): 97.7 Height (in): 62 Pulse (bpm): 79 Weight (lbs): 140 Respiratory Rate (breaths/min): 16 Body Mass Index (BMI): 25.6 Blood Pressure (mmHg): 153/69 Reference Range: 80 - 120 mg / dl Electronic Signature(s) Signed: 07/27/2019 3:04:24 PM By: [name] Entered By: [name] on 05/03/2019 13:16:19

## 2019-07-27 NOTE — Progress Notes (Signed)
JAYNIE, HITCH ([id number]) Visit Report for 05/24/2019 Arrival Information Details Patient Name: Date of Service: ALVERNA, FAWLEY 05/24/2019 10:15 AM Medical Record [id number] Patient Account Number: [id number] Date of Birth/Sex: Treating RN: [birth year] (73 y.o. Benjamine Sprague, Briant Cedar Primary Care Byrant Valent: Chrisandra Netters Other Clinician: Referring Sky Primo: Treating Kiaria Quinnell/Extender:Robson, Nonda Lou, Ivan Anchors in Treatment: 46 Visit Information History Since Last Visit Added or deleted any medications: No Patient Arrived: [name] Any new allergies or adverse reactions: No Arrival Time: 10:55 Had a fall or experienced change in No Accompanied By: self activities of daily living that may affect Transfer Assistance: None risk of falls: Patient Identification Verified: Yes Signs or symptoms of abuse/neglect since last No Secondary Verification Process Yes visito Completed: Hospitalized since last visit: No Patient Requires Transmission-Based No Implantable device outside of the clinic excluding No Precautions: cellular tissue based products placed in the center Patient Has Alerts: Yes since last visit: Patient Alerts: right non Has Dressing in Place as Prescribed: Yes compressable Pain Present Now: No Electronic Signature(s) Signed: 07/27/2019 3:02:15 PM By: Sandre Kitty Entered By: Sandre Kitty on 05/24/2019 10:59:12 -------------------------------------------------------------------------------- Encounter Discharge Information Details Patient Name: Date of Service: [name] 05/24/2019 10:15 AM Medical Record [id number] Patient Account Number: [id number] Date of Birth/Sex: Treating RN: 05-01-1946 (73 y.o. Clearnce Sorrel Primary Care Djon Tith: Chrisandra Netters Other Clinician: Referring [name]: Treating Loura Pitt/Extender:Robson, Nonda Lou, Ivan Anchors in Treatment: 46 Encounter Discharge Information Items Post  Procedure Vitals Discharge Condition: Stable Temperature (F): 98.3 Ambulatory Status: Walker Pulse (bpm): 86 Discharge Destination: Home Respiratory Rate (breaths/min): 16 Transportation: Private Auto Blood Pressure (mmHg): 150/77 Accompanied By: self Schedule Follow-up Appointment: Yes Clinical Summary of Care: Patient Declined Electronic Signature(s) Signed: 05/27/2019 4:29:59 PM By: Kela Millin Entered By: Kela Millin on 05/24/2019 12:14:13 -------------------------------------------------------------------------------- Lower Extremity Assessment Details Patient Name: Date of Service: ELZINA, DEVERA 05/24/2019 10:15 AM Medical Record [id number] Patient Account Number: [id number] Date of Birth/Sex: Treating RN: 05/30/1946 (73 y.o. [name] Primary Care Nea Gittens: Chrisandra Netters Other Clinician: Referring Shermaine Brigham: Treating Savier Trickett/Extender:Robson, Nonda Lou, Ivan Anchors in Treatment: 46 Edema Assessment Assessed: [Left: Yes] [Right: Yes] Edema: [Left: Yes] [Right: Yes] Calf Left: Right: Point of Measurement: 31 cm From Medial Instep 32 cm 29 cm Ankle Left: Right: Point of Measurement: 11.5 cm From Medial Instep 26 cm 24 cm Vascular Assessment Pulses: Dorsalis Pedis Palpable: [Left:Yes] [Right:Yes] Electronic Signature(s) Signed: 05/24/2019 5:53:08 PM By: Deon Pilling Entered By: Deon Pilling on 05/24/2019 11:07:45 -------------------------------------------------------------------------------- Multi Wound Chart Details Patient Name: Date of Service: [name] 05/24/2019 10:15 AM Medical Record [id number] Patient Account Number: [id number] Date of Birth/Sex: Treating RN: Jul 17, 1946 (73 y.o. 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] [N/A:N/A] Comorbid History: [19:Cataracts, Hypertension, N/A Type II Diabetes, Rheumatoid Arthritis] [N/A:N/A] Date Acquired: [19:08/07/2018] [26:05/14/2019] [N/A:N/A] Weeks of Treatment: [19:40] [26:1] [N/A:N/A] Wound Status: [19:Open] [26:Healed - Epithelialized] [N/A:N/A] Clustered Wound: [19:Yes] [26:No] [N/A:N/A] Clustered Quantity: [19:2] [26:N/A] [N/A:N/A] Measurements L x W x D 0.6x2.6x0.1 [26:0x0x0] [N/A:N/A] (cm) Area (cm) : [19:1.225] [26:0] [N/A:N/A] Volume (cm) : [16:6.060] [26:0] [N/A:N/A] % Reduction in Area: [19:-419.10%] [26:100.00%] [N/A:N/A] %  Reduction in Volume: -161.70% [26:100.00%] [N/A:N/A] Classification: [19:Grade 2] [26:Partial Thickness] [N/A:N/A] Exudate Amount: [19:Medium] [26:N/A] [N/A:N/A] Exudate Type: [19:Serosanguineous] [26:N/A] [N/A:N/A] Exudate Color: [19:red, brown] [26:N/A] [N/A:N/A] Wound Margin: [19:Flat and Intact] [26:N/A] [N/A:N/A] Granulation Amount: [19:Large (67-100%)] [26:N/A] [N/A:N/A] Granulation Quality: [19:Red, Pink] [26:N/A] [N/A:N/A] Necrotic Amount: [19:None Present (0%)] [26:N/A] [N/A:N/A] Exposed Structures: [19:Fat Layer (Subcutaneous N/A Tissue) Exposed: Yes Fascia: No Tendon: No Muscle: No Joint: No Bone: No] [N/A:N/A] Epithelialization: [19:Large (67-100%)] [26:N/A] [N/A:N/A] Debridement: [19:Debridement - Excisional N/A] [N/A:N/A] Pre-procedure [19:11:33] [26:N/A] [N/A:N/A] Verification/Time Out Taken: Pain Control: [19:Lidocaine 5% topical ointment] [26:N/A] [N/A:N/A] Tissue Debrided: [19:Callus, Subcutaneous]  [26:N/A] [N/A:N/A] Level: [19:Skin/Subcutaneous Tissue N/A] [N/A:N/A] Debridement Area (sq cm):1.56 [26:N/A] [N/A:N/A] Instrument: [19:Curette] [26:N/A] [N/A:N/A] Bleeding: [19:None] [26:N/A] [N/A:N/A] Procedural Pain: [19:0] [26:N/A] [N/A:N/A] Post Procedural Pain: [19:0] [26:N/A] [N/A:N/A] Debridement Treatment [19:Procedure was tolerated] [26:N/A] [N/A:N/A] Response: [19:well] Post Debridement [19:0.6x2.6x0.1] [26:N/A] [N/A:N/A] Measurements L x W x D (cm) Post Debridement [81:4.481] [26:N/A] [N/A:N/A] Volume: (cm) Procedures Performed: [19:Debridement] [26:N/A] [N/A:N/A] Treatment Notes Electronic Signature(s) Signed: 05/24/2019 6:11:18 PM By: Linton Ham MD Signed: 05/24/2019 6:17:12 PM By: Levan Hurst RN, BSN Entered By: Linton Ham on 05/24/2019 12:01:31 -------------------------------------------------------------------------------- Multi-Disciplinary Care Plan Details Patient Name: Date of Service: KESHONDA, MONSOUR 05/24/2019 10:15 AM Medical Record [id number] Patient Account Number: [id number] Date of Birth/Sex: Treating RN: 28-Jul-1946 (73 y.o. Clearnce Sorrel Primary Care : Chrisandra Netters Other Clinician: Referring : Treating /Extender:Robson, Nonda Lou, Ivan Anchors in Treatment: 46 Active Inactive Wound/Skin Impairment Nursing Diagnoses: Impaired tissue integrity Knowledge deficit related to ulceration/compromised skin integrity Goals: Patient/caregiver will verbalize understanding of skin care regimen Date Initiated: 07/03/2018 Target Resolution Date: 06/18/2019 Goal Status: Active Ulcer/skin breakdown will have a volume reduction of 30% by week 4 Date Initiated: 07/03/2018 Date Inactivated: 07/31/2018 Target12/13/2019 Resolution Date: Unmet Reason: necrotic Goal Status: Unmet toes, had vascular procedure Ulcer/skin breakdown will have a volume reduction of 50% by week 8 Date Initiated: 07/31/2018  Date Inactivated: 08/31/2018 Target Resolution Date: 09/04/2018 Goal Status: Met Ulcer/skin breakdown will have a volume reduction of 80% by week 12 Date Initiated: 08/31/2018 Date Inactivated: 10/05/2018 Target Resolution Date: 10/02/2018 Goal Status: Unmet Unmet Reason: PAD Interventions: Assess patient/caregiver ability to obtain necessary supplies Assess patient/caregiver ability to perform ulcer/skin care regimen upon admission and as needed Assess ulceration(s) every visit Provide education on ulcer and skin care Treatment Activities: Patient referred to home care : 07/03/2018 Skin care regimen initiated : 07/03/2018 Topical wound management initiated : 07/03/2018 Notes: Electronic Signature(s) Signed: 05/27/2019 4:29:59 PM By: Kela Millin Entered By: Kela Millin on 05/24/2019 11:32:34 -------------------------------------------------------------------------------- Pain Assessment Details Patient Name: Date of Service: [name] 05/24/2019 10:15 AM Medical Record [id number] Patient Account Number: [id number] Date of Birth/Sex: Treating RN: 1946/03/09 (73 y.o. [name] Primary Care : Chrisandra Netters Other Clinician: Referring : Treating /Extender:Robson, Nonda Lou, Ivan Anchors in Treatment: 46 Active Problems Location of Pain Severity and Description of Pain Patient Has Paino No Site Locations Pain Management and Medication Current Pain Management: Electronic Signature(s) Signed: 05/24/2019 6:17:12 PM By: Levan Hurst RN, BSN Signed: 07/27/2019 3:02:15 PM By: Sandre Kitty Entered By: Sandre Kitty on 05/24/2019 11:01:00 -------------------------------------------------------------------------------- Patient/Caregiver Education Details Patient Name: Date of Service: [name] 10/5/2020andnbsp10:15 AM Medical Record [id number] Patient Account Number: [id number] Date of  Birth/Gender: 1946/04/17 (73 y.o. F) Treating RN: Levan Hurst Primary Care Physician: Chrisandra Netters Other Clinician: Referring Physician: Treating Physician/Extender:Robson, Nonda Lou, Ivan Anchors in Treatment: 89 Education Assessment Education Provided To: Patient Education Topics Provided Wound/Skin  Impairment: Methods: Explain/Verbal Responses: State content correctly Electronic Signature(s) Signed: 05/24/2019 6:17:12 PM By: Levan Hurst RN, BSN Entered By: Levan Hurst on 05/24/2019 18:00:49 -------------------------------------------------------------------------------- Wound Assessment Details Patient Name: Date of Service: [name] 05/24/2019 10:15 AM Medical Record [id number] Patient Account Number: [id number] Date of Birth/Sex: Treating RN: 1946-03-15 (73 y.o. [name], Tammi Klippel Primary Care : Chrisandra Netters Other Clinician: Referring : Treating /Extender:Robson, Nonda Lou, Ivan Anchors in Treatment: 46 Wound Status Wound Number: 19 Primary Diabetic Wound/Ulcer of the Lower Etiology: Extremity Wound Location: Left Foot - Plantar Wound Open Wounding Event: Gradually Appeared Status: Date Acquired: 08/07/2018 Comorbid Cataracts, Hypertension, Type II Diabetes, Weeks Of Treatment: 40 History: Rheumatoid Arthritis Clustered Wound: Yes Photos Wound Measurements Length: (cm) 0.6 % Reduct Width: (cm) 2.6 % Reduct Depth: (cm) 0.1 Epitheli Clustered Quantity: 2 Tunnelin Area: (cm) 1.225 Undermi Volume: (cm) 0.123 Wound Description Classification: Grade 2 Wound Margin: Flat and Intact Exudate Amount: Medium Exudate Type: Serosanguineous Exudate Color: red, brown Wound Bed Granulation Amount: Large (67-100%) Granulation Quality: Red, Pink Necrotic Amount: None Present (0%) Foul Odor After Cleansing: No Slough/Fibrino No Exposed Structure Fascia Exposed: No Fat Layer (Subcutaneous Tissue)  Exposed: Yes Tendon Exposed: No Muscle Exposed: No Joint Exposed: No Bone Exposed: No ion in Area: -419.1% ion in Volume: -161.7% alization: Large (67-100%) g: No ning: No Electronic Signature(s) Signed: 05/25/2019 12:19:24 PM By: Mikeal Hawthorne EMT/HBOT Signed: 05/26/2019 6:07:42 PM By: Deon Pilling Previous Signature: 05/24/2019 5:53:08 PM Version By: Deon Pilling Entered By: Mikeal Hawthorne on 05/25/2019 10:17:00 -------------------------------------------------------------------------------- Wound Assessment Details Patient Name: Date of Service: [name] 05/24/2019 10:15 AM Medical Record [id number] Patient Account Number: [id number] Date of Birth/Sex: Treating RN: 04/22/1946 (73 y.o. [name], Tammi Klippel Primary Care : Chrisandra Netters Other Clinician: Referring : Treating /Extender:Robson, Nonda Lou, Ivan Anchors in Treatment: 46 Wound Status Wound Number: 26 Primary Venous Leg Ulcer Etiology: Wound Location: Left Lower Leg - Anterior Wound Healed - Epithelialized Wounding Event: Blister Status: Date Acquired: 05/14/2019 Comorbid Cataracts, Hypertension, Type II Diabetes, Weeks Of Treatment: 1 History: Rheumatoid Arthritis Clustered Wound: No Photos Wound Measurements Length: (cm) 0 % Reducti Width: (cm) 0 % Reducti Depth: (cm) 0 Epithelia Area: (cm) 0 Volume: (cm) 0 Wound Description Classification: Partial Thickness Foul Odo Wound Margin: Distinct, outline attached Slough/F Exudate Amount: Medium Exudate Type: Serosanguineous Exudate Color: red, brown Wound Bed Granulation Amount: Large (67-100%) Granulation Quality: Pink Fascia E Necrotic Amount: None Present (0%) Fat Laye Tendon E Muscle E Joint Ex Bone Exp r After Cleansing: No ibrino No Exposed Structure xposed: No r (Subcutaneous Tissue) Exposed: No xposed: No xposed: No posed: No osed: No on in Area: 100% on in Volume: 100% lization: Small  (1-33%) Electronic Signature(s) Signed: 05/25/2019 12:19:24 PM By: Mikeal Hawthorne EMT/HBOT Signed: 05/26/2019 6:07:42 PM By: Deon Pilling Previous Signature: 05/24/2019 5:53:08 PM Version By: Deon Pilling Entered By: Mikeal Hawthorne on 05/25/2019 10:16:15 -------------------------------------------------------------------------------- Vitals Details Patient Name: Date of Service: [name] 05/24/2019 10:15 AM Medical Record [id number] Patient Account Number: [id number] Date of Birth/Sex: Treating RN: 1946/02/21 (73 y.o. [name] Primary Care : Chrisandra Netters Other Clinician: Referring : Treating /Extender:Robson, Nonda Lou, Ivan Anchors in Treatment: 46 Vital Signs Time Taken: 11:00 Temperature (F): 98.3 Height (in): 62 Pulse (bpm): 86 Weight (lbs): 140 Respiratory Rate (breaths/min): 16 Body Mass Index (BMI): 25.6 Blood Pressure (mmHg): 150/77 Reference Range: 80 - 120 mg / dl Electronic Signature(s) Signed: 07/27/2019 3:02:15 PM By: Sandre Kitty Entered By: Sandre Kitty on  05/24/2019 11:00:53
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
## 2019-07-28 DIAGNOSIS — L97421 Non-pressure chronic ulcer of left heel and midfoot limited to breakdown of skin: Secondary | ICD-10-CM | POA: Diagnosis not present

## 2019-07-28 DIAGNOSIS — E1142 Type 2 diabetes mellitus with diabetic polyneuropathy: Secondary | ICD-10-CM | POA: Diagnosis not present

## 2019-07-28 DIAGNOSIS — E039 Hypothyroidism, unspecified: Secondary | ICD-10-CM | POA: Diagnosis not present

## 2019-07-28 DIAGNOSIS — I10 Essential (primary) hypertension: Secondary | ICD-10-CM | POA: Diagnosis not present

## 2019-07-28 DIAGNOSIS — E11621 Type 2 diabetes mellitus with foot ulcer: Secondary | ICD-10-CM | POA: Diagnosis not present

## 2019-08-04 IMAGING — DX DG FEMUR 2+V*L*
4 series · 4 of 4 positions shown · non-contrast
Comparison: None.

CLINICAL DATA: Pain and swelling of the lower legs, laceration of
the right lower leg, confusion

EXAM:
LEFT FEMUR 2 VIEWS

[t femur proximal ap left]
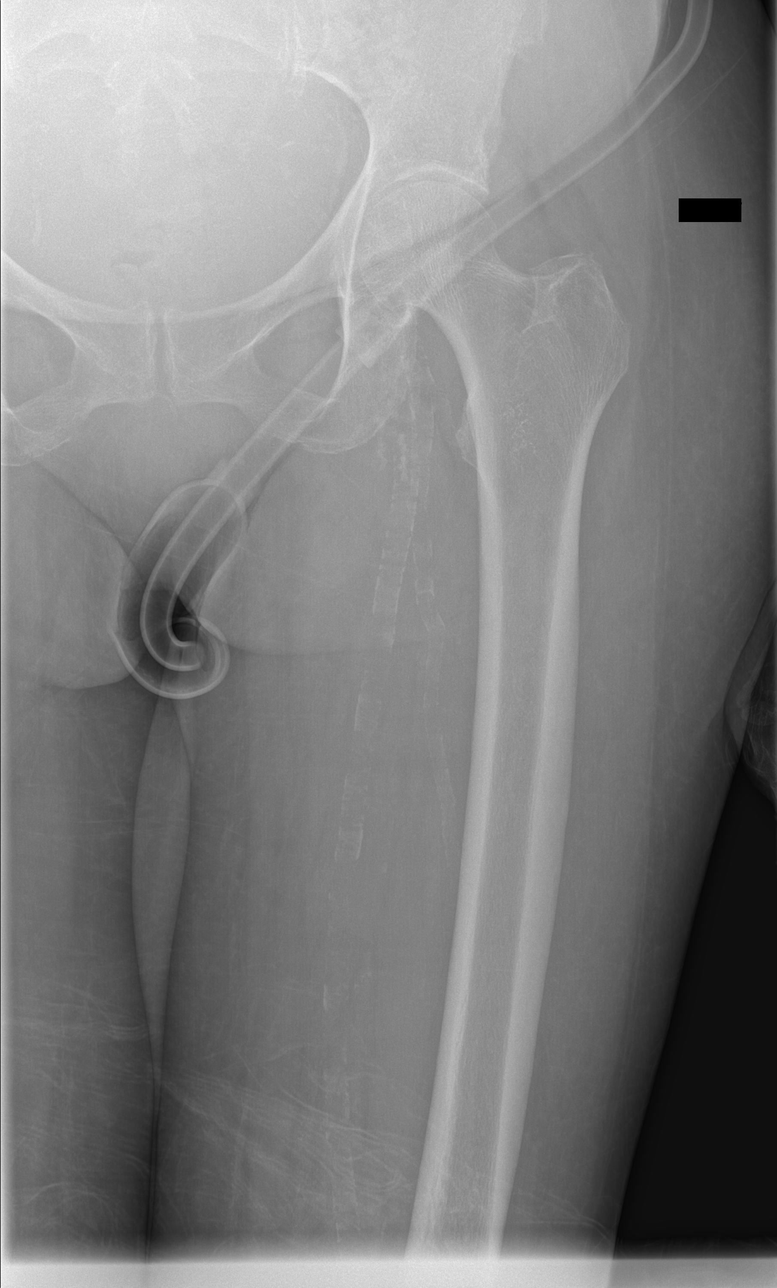

[t femur distal ap left]
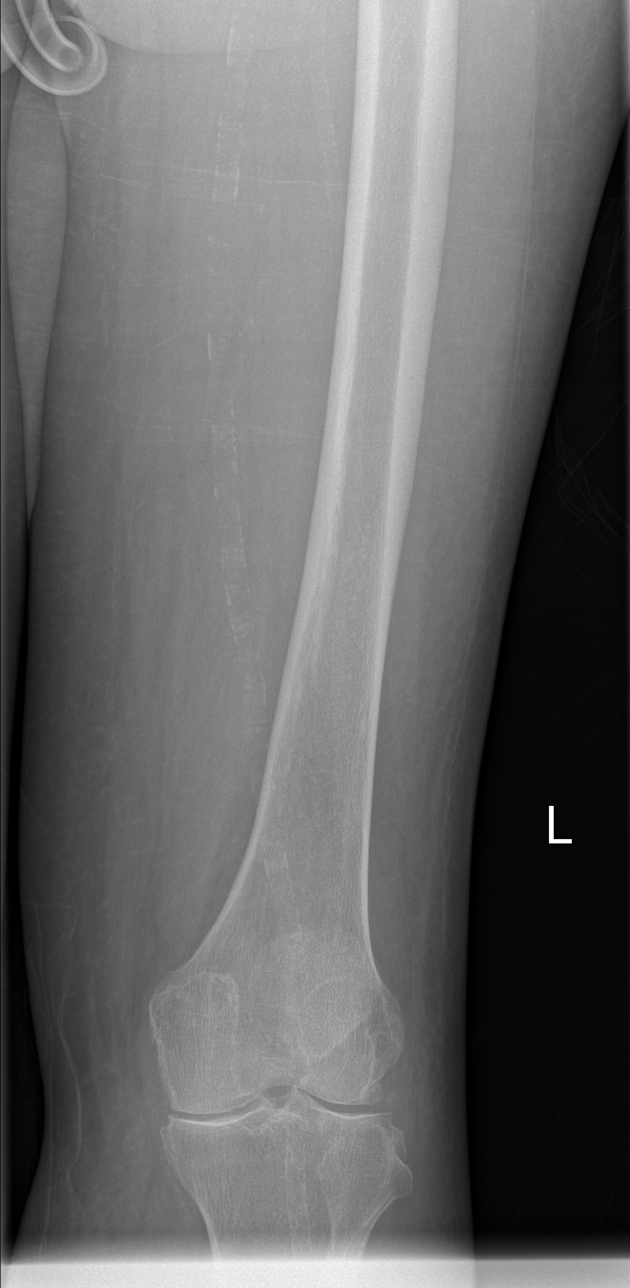

[t femur distal lat left]
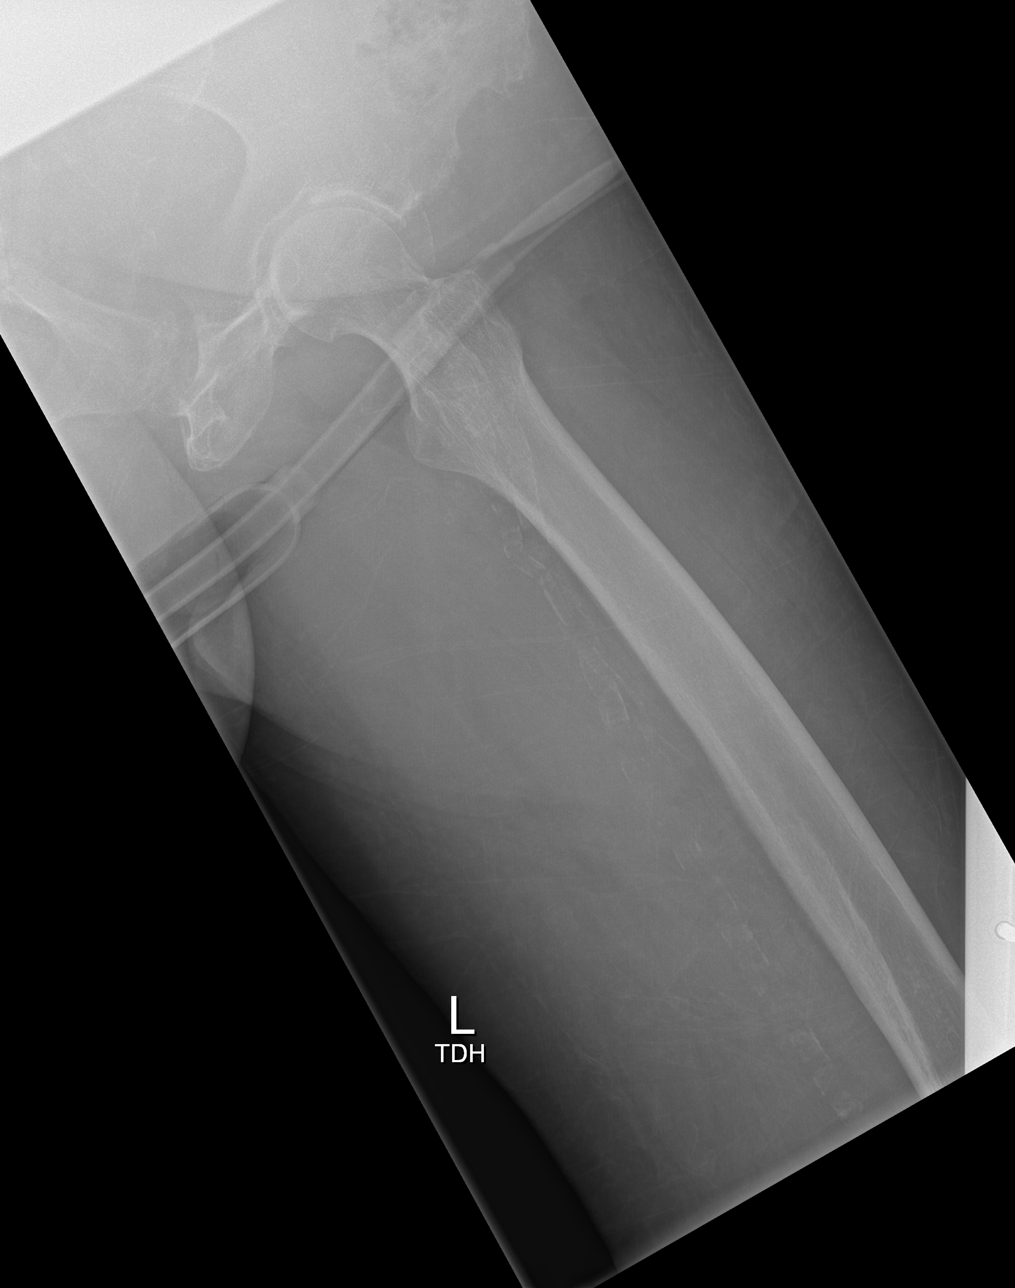

[x femur distal lat left]
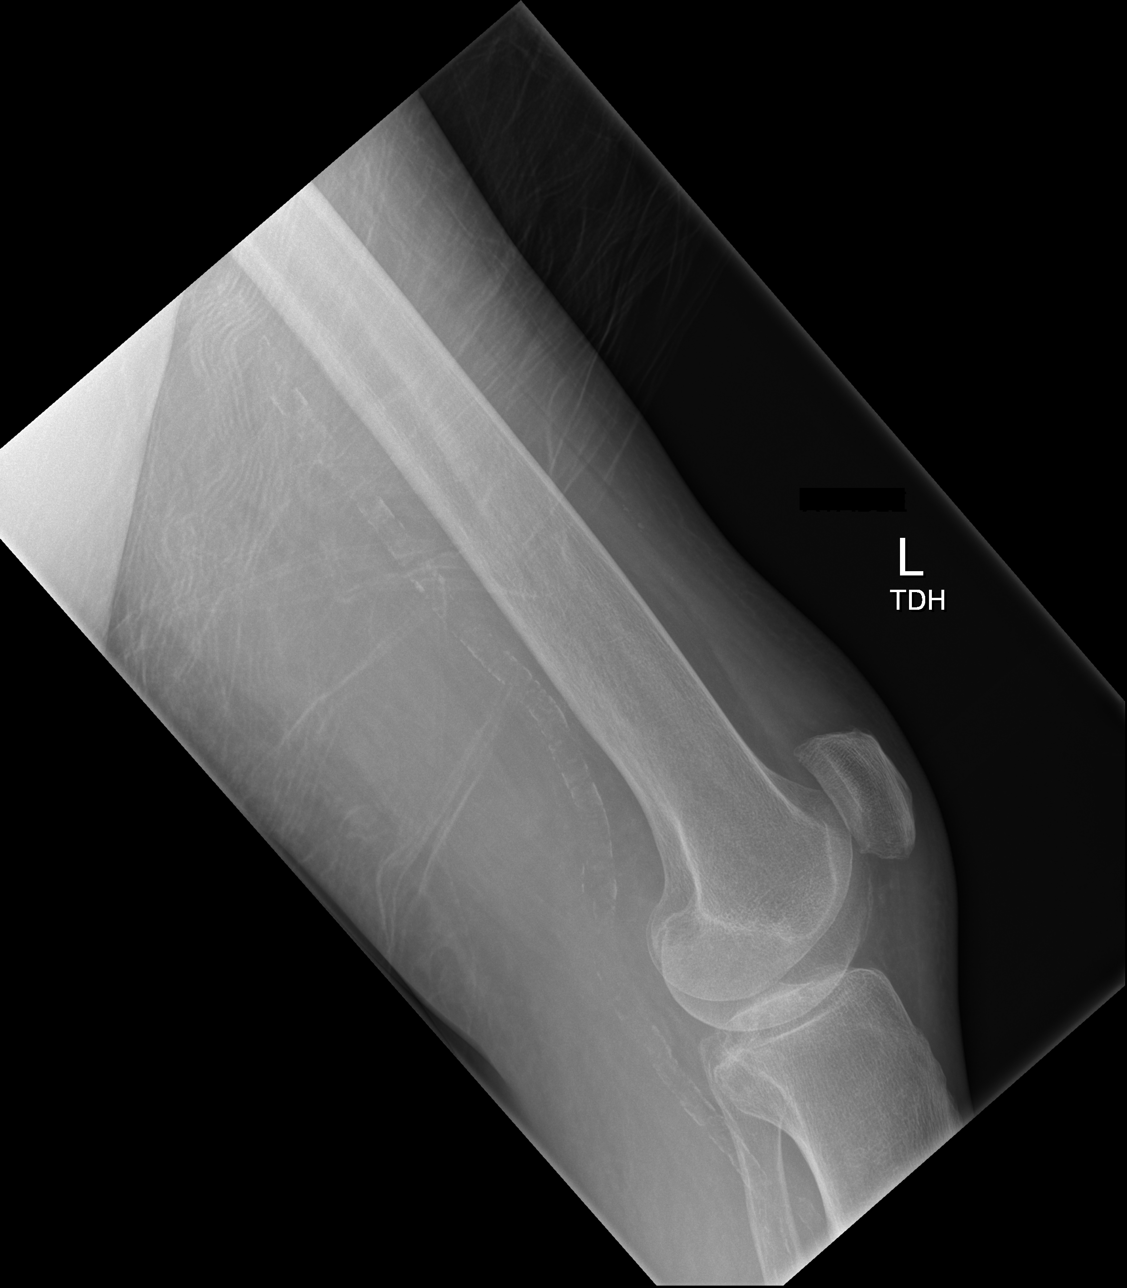

[4 of 4 positions shown; findings below may reference images not displayed]

FINDINGS: No acute fracture is seen. There is moderate degenerative joint
disease of the left hip. Diffuse arterial calcifications are
present. There is tricompartmental degenerative joint disease of the
left knee
IMPRESSION: 1. No acute fracture.
2. Degenerative change of the left hip and left knee.

## 2019-08-06 ENCOUNTER — Telehealth: Payer: Self-pay | Admitting: Rheumatology

## 2019-08-06 ENCOUNTER — Encounter (HOSPITAL_COMMUNITY): Payer: Medicare Other

## 2019-08-06 NOTE — Telephone Encounter (Signed)
I LMOM for patient to call, and reschedule follow up appointment for 08/30/19 for in person after labs.

## 2019-08-06 NOTE — Telephone Encounter (Signed)
-----   Message from Shona Needles, RT sent at 07/27/2019 11:27 AM EST ----- Regarding: NP F/U AFTER LABS

## 2019-08-18 ENCOUNTER — Encounter (HOSPITAL_COMMUNITY): Payer: Medicare Other

## 2019-08-23 ENCOUNTER — Encounter (HOSPITAL_COMMUNITY)
Admission: RE | Admit: 2019-08-23 | Discharge: 2019-08-23 | Disposition: A | Discharge status: Home / Self Care | Payer: Medicare Other | Source: Ambulatory Visit | Attending: Nephrology | Admitting: Nephrology

## 2019-08-23 ENCOUNTER — Encounter (HOSPITAL_COMMUNITY): Payer: Medicare Other

## 2019-08-23 ENCOUNTER — Other Ambulatory Visit: Payer: Self-pay

## 2019-08-23 VITALS — BP 127/64 | HR 81 | Temp 95.0°F | Resp 20

## 2019-08-23 DIAGNOSIS — D508 Other iron deficiency anemias: Secondary | ICD-10-CM | POA: Insufficient documentation

## 2019-08-23 DIAGNOSIS — D631 Anemia in chronic kidney disease: Secondary | ICD-10-CM | POA: Insufficient documentation

## 2019-08-23 DIAGNOSIS — N189 Chronic kidney disease, unspecified: Secondary | ICD-10-CM | POA: Diagnosis not present

## 2019-08-23 LAB — IRON AND TIBC
Iron: 35 ug/dL (ref 28–170)
Saturation Ratios: 24 % (ref 10.4–31.8)
TIBC: 147 ug/dL — ABNORMAL LOW (ref 250–450)
UIBC: 112 ug/dL

## 2019-08-23 LAB — FERRITIN: Ferritin: 1383 ng/mL — ABNORMAL HIGH (ref 11–307)

## 2019-08-23 LAB — POCT HEMOGLOBIN-HEMACUE: Hemoglobin: 10.6 g/dL — ABNORMAL LOW (ref 12.0–15.0)

## 2019-08-23 MED ORDER — EPOETIN ALFA-EPBX 40000 UNIT/ML IJ SOLN
INTRAMUSCULAR | Status: AC
  Filled 2019-08-23: qty 1

## 2019-08-23 MED ORDER — EPOETIN ALFA-EPBX 40000 UNIT/ML IJ SOLN
40000.0000 [IU] | INTRAMUSCULAR | Status: DC
  Administered 2019-08-23: 40000 [IU] via SUBCUTANEOUS

## 2019-08-25 ENCOUNTER — Other Ambulatory Visit: Payer: Medicare Other

## 2019-08-30 ENCOUNTER — Ambulatory Visit: Payer: Medicare Other | Admitting: Rheumatology

## 2019-08-30 NOTE — Progress Notes (Deleted)
Office Visit Note  Patient: [name]             Date of Birth: 1946-04-26           MRN: [id number]             PCP: [name], MD Referring: [name], MD Visit Date: 08/31/2019 Occupation: @GUAROCC @  Subjective:  No chief complaint on file.   History of Present Illness: [name] is a 74 y.o. female ***   Activities of Daily Living:  Patient reports morning stiffness for *** {minute/hour:19697}.   Patient {ACTIONS;DENIES/REPORTS:21021675::"Denies"} nocturnal pain.  Difficulty dressing/grooming: {ACTIONS;DENIES/REPORTS:21021675::"Denies"} Difficulty climbing stairs: {ACTIONS;DENIES/REPORTS:21021675::"Denies"} Difficulty getting out of chair: {ACTIONS;DENIES/REPORTS:21021675::"Denies"} Difficulty using hands for taps, buttons, cutlery, and/or writing: {ACTIONS;DENIES/REPORTS:21021675::"Denies"}  No Rheumatology ROS completed.   PMFS History:  Patient Active Problem List   Diagnosis Date Noted  . Left breast mass 06/02/2019  . Stress 06/02/2019  . PAD (peripheral artery disease) ([place]) 07/22/2018  . Wound of lower extremity, right, initial encounter 06/24/2018  . Dyspnea 03/23/2018  . Impaired functional mobility, balance, gait, and endurance 03/02/2018    Class: Diagnosis of  . Abnormality of gait 02/26/2018  . Iron deficiency anemia 03/19/2017  . Vision disturbance 03/20/2015  . Memory difficulty 03/20/2015  . At high risk for falls 11/10/2012  . DIABETIC  RETINOPATHY 03/17/2007  . DIABETIC PERIPHERAL NEUROPATHY 03/17/2007  . Hypothyroidism 10/16/2006  . Type 2 diabetes mellitus ([place]) 10/16/2006  . OBESITY, NOS 10/16/2006  . HYPERTENSION, BENIGN SYSTEMIC 10/16/2006  . GASTROESOPHAGEAL REFLUX, NO ESOPHAGITIS 10/16/2006  . Rheumatoid arthritis ([place]) 10/16/2006  . OSTEOPENIA 10/16/2006  . INCONTINENCE, URGE 10/16/2006    Past Medical History:  Diagnosis Date  . Bilateral lower extremity edema 06/06/2014  . Cataracts, bilateral  02/13/2011   Seen on eye exam 02/07/11. F/u in 12 months   . Diabetes mellitus age 89  . Diabetic ulcer of toe ([place]) 12/12/2015  . Hyperlipidemia   . Hypertension   . Retinal detachment, old, partial    left  . Retinopathy due to secondary diabetes mellitus ([place])    L>R, laser 3/07  . Schizotypal personality disorder ([place])   . Thyroid disease   . Weight loss 01/22/2012    Family History  Problem Relation Age of Onset  . Heart disease Mother   . Hypertension Mother   . Heart attack Mother   . Colon cancer Brother   . Colon cancer Maternal Grandmother   . Pulmonary fibrosis Father   . Diabetes Daughter   . Liver disease Sister        transplant, liver  . Liver cancer Brother   . Breast cancer Niece    Past Surgical History:  Procedure Laterality Date  . ABDOMINAL AORTOGRAM W/LOWER EXTREMITY N/A 07/23/2018   Procedure: ABDOMINAL AORTOGRAM W/LOWER EXTREMITY;  Surgeon: Marty Heck, MD;  Location: [facility];  Service: Cardiovascular;  Laterality: N/A;  . BREAST EXCISIONAL BIOPSY Bilateral   . BREAST SURGERY    . CATARACT EXTRACTION     left   . ROTATOR CUFF REPAIR  1990's   left  . Thyroid radiation ablation     for Graves Disease  . TRANSTHORACIC ECHOCARDIOGRAM      EF55-65%, nml - 12/14/2004   Social History   Social History Narrative   3 daughters.  Takes care of 8 grandchildren at home.  Lots of stress.  no tobacco, no etoh.  Father is Cristy Hilts- deceased Dec 08, 2003. Sister -  Debra [place].         Health Care POA:    Emergency Contact: daughter, Janett Billow (c) [phone]   End of Life Plan:    Who lives with you: self   Any pets: none   Diet: Pt has a varied diet of protein starch and vegetables. Pt reports eating a lot of potatoes and starches.  Does not follow diabetic diet.    Exercise: Pt does exercises 2x a week at [employer].    Seatbelts: Pt reports wearing seatbelt when in vehicles.    Sun Exposure/Protection: Pt reports not using sun protection.    Hobbies:  Attends Estate manager/land agent through [employer] Monday through Friday 9-5pm.  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      Current Social History  03/06/2017   Who lives at home: Lives alone in one level home; grandson stays occasionally 03/06/2017    Transportation: Bus or taxi currently as car is not working 03/06/2017   Important Relationships & Pets: "Everybody I meet." No pets 03/06/2017    Current Stressors: Transportation, bus line 03/06/2017   Work / Education:  Retired/ 12 th grade 03/06/2017   Religious / Personal Beliefs: "I believe in God, [place], and the resurrection." 03/06/2017   Interests / Fun: crafts, yard work, going to [employer] 03/06/2017   L. Ducatte, RN, BSN                                                                                                          Immunization History  Administered Date(s) Administered  . Influenza Split 05/14/2012  . Influenza Whole 07/10/2007, 05/11/2008, 05/13/2008  . Influenza,inj,Quad PF,6+ Mos 06/20/2017, 06/02/2019  . Influenza-Unspecified 06/30/2018  . Pneumococcal Conjugate-13 01/14/2017  . Pneumococcal Polysaccharide-23 07/19/1994, 02/17/2018  . Td 10/22/2004  . Tdap 02/24/2018     Objective: Vital Signs: There were no vitals taken for this visit.   Physical Exam   Musculoskeletal Exam: ***  CDAI Exam: CDAI Score: -- Patient Global: --; Provider Global: -- Swollen: --; Tender: -- Joint Exam 08/31/2019   No joint exam has been documented for this visit   There is currently no information documented on the homunculus. Go to the Rheumatology activity and complete the homunculus joint exam.  Investigation: No additional findings.  Imaging: No results found.  Recent Labs: Lab Results  Component Value Date   WBC WILL FOLLOW 06/02/2019   HGB 10.6 (L) 08/23/2019   PLT WILL FOLLOW 06/02/2019   NA 140 07/23/2018   K 4.5 07/23/2018   CL 104 07/23/2018   CO2 28 06/24/2018   GLUCOSE 110 (H) 07/23/2018    BUN 37 (H) 07/23/2018   CREATININE 1.40 (H) 02/25/2019   BILITOT 0.3 11/18/2017   ALKPHOS 91 11/18/2017   AST 10 11/18/2017   ALT 7 11/18/2017   PROT 9.8 (H) 11/18/2017   ALBUMIN 3.2 (L) 11/18/2017   CALCIUM 9.2 06/24/2018   GFRAA 41 (L) 06/24/2018    Speciality Comments: No specialty comments available.  Procedures:  No procedures performed Allergies: Feraheme [ferumoxytol] and  Rosiglitazone maleate   Assessment / Plan:     Visit Diagnoses: No diagnosis found.  Orders: No orders of the defined types were placed in this encounter.  No orders of the defined types were placed in this encounter.   Face-to-face time spent with patient was *** minutes. Greater than 50% of time was spent in counseling and coordination of care.  Follow-Up Instructions: No follow-ups on file.   Ofilia Neas, PA-C  Note - This record has been created using Dragon software.  Chart creation errors have been sought, but may not always  have been located. Such creation errors do not reflect on  the standard of medical care.

## 2019-08-31 ENCOUNTER — Ambulatory Visit: Payer: Medicare Other | Admitting: Rheumatology

## 2019-09-01 ENCOUNTER — Encounter (HOSPITAL_COMMUNITY): Payer: Medicare Other

## 2019-09-06 ENCOUNTER — Ambulatory Visit (HOSPITAL_COMMUNITY)
Admission: RE | Admit: 2019-09-06 | Discharge: 2019-09-06 | Disposition: A | Discharge status: Home / Self Care | Payer: Medicare Other | Source: Ambulatory Visit | Attending: Nephrology | Admitting: Nephrology

## 2019-09-06 ENCOUNTER — Other Ambulatory Visit: Payer: Self-pay

## 2019-09-06 VITALS — BP 160/74 | HR 79 | Temp 95.4°F | Resp 20

## 2019-09-06 DIAGNOSIS — N189 Chronic kidney disease, unspecified: Secondary | ICD-10-CM | POA: Diagnosis not present

## 2019-09-06 DIAGNOSIS — D631 Anemia in chronic kidney disease: Secondary | ICD-10-CM | POA: Diagnosis not present

## 2019-09-06 DIAGNOSIS — D508 Other iron deficiency anemias: Secondary | ICD-10-CM

## 2019-09-06 LAB — POCT HEMOGLOBIN-HEMACUE: Hemoglobin: 10.9 g/dL — ABNORMAL LOW (ref 12.0–15.0)

## 2019-09-06 MED ORDER — EPOETIN ALFA-EPBX 40000 UNIT/ML IJ SOLN
INTRAMUSCULAR | Status: AC
  Administered 2019-09-06: 40000 [IU] via SUBCUTANEOUS
  Filled 2019-09-06: qty 1

## 2019-09-06 MED ORDER — EPOETIN ALFA-EPBX 40000 UNIT/ML IJ SOLN: 40000.0000 [IU] | INTRAMUSCULAR | Status: DC

## 2019-09-06 NOTE — Progress Notes (Deleted)
Office Visit Note  Patient: [name]             Date of Birth: 05-04-1946           MRN: [id number]             PCP: [name], MD Referring: [name], MD Visit Date: 09/07/2019 Occupation: @GUAROCC @  Subjective:  No chief complaint on file.   History of Present Illness: [name] is a 74 y.o. female ***   Activities of Daily Living:  Patient reports morning stiffness for *** {minute/hour:19697}.   Patient {ACTIONS;DENIES/REPORTS:21021675::"Denies"} nocturnal pain.  Difficulty dressing/grooming: {ACTIONS;DENIES/REPORTS:21021675::"Denies"} Difficulty climbing stairs: {ACTIONS;DENIES/REPORTS:21021675::"Denies"} Difficulty getting out of chair: {ACTIONS;DENIES/REPORTS:21021675::"Denies"} Difficulty using hands for taps, buttons, cutlery, and/or writing: {ACTIONS;DENIES/REPORTS:21021675::"Denies"}  No Rheumatology ROS completed.   PMFS History:  Patient Active Problem List   Diagnosis Date Noted  . Left breast mass 06/02/2019  . Stress 06/02/2019  . PAD (peripheral artery disease) ([place]) 07/22/2018  . Wound of lower extremity, right, initial encounter 06/24/2018  . Dyspnea 03/23/2018  . Impaired functional mobility, balance, gait, and endurance 03/02/2018    Class: Diagnosis of  . Abnormality of gait 02/26/2018  . Iron deficiency anemia 03/19/2017  . Vision disturbance 03/20/2015  . Memory difficulty 03/20/2015  . At high risk for falls 11/10/2012  . DIABETIC  RETINOPATHY 03/17/2007  . DIABETIC PERIPHERAL NEUROPATHY 03/17/2007  . Hypothyroidism 10/16/2006  . Type 2 diabetes mellitus ([place]) 10/16/2006  . OBESITY, NOS 10/16/2006  . HYPERTENSION, BENIGN SYSTEMIC 10/16/2006  . GASTROESOPHAGEAL REFLUX, NO ESOPHAGITIS 10/16/2006  . Rheumatoid arthritis ([place]) 10/16/2006  . OSTEOPENIA 10/16/2006  . INCONTINENCE, URGE 10/16/2006    Past Medical History:  Diagnosis Date  . Bilateral lower extremity edema 06/06/2014  . Cataracts, bilateral  02/13/2011   Seen on eye exam 02/07/11. F/u in 12 months   . Diabetes mellitus age 31  . Diabetic ulcer of toe ([place]) 12/12/2015  . Hyperlipidemia   . Hypertension   . Retinal detachment, old, partial    left  . Retinopathy due to secondary diabetes mellitus ([place])    L>R, laser 3/07  . Schizotypal personality disorder ([place])   . Thyroid disease   . Weight loss 01/22/2012    Family History  Problem Relation Age of Onset  . Heart disease Mother   . Hypertension Mother   . Heart attack Mother   . Colon cancer Brother   . Colon cancer Maternal Grandmother   . Pulmonary fibrosis Father   . Diabetes Daughter   . Liver disease Sister        transplant, liver  . Liver cancer Brother   . Breast cancer Niece    Past Surgical History:  Procedure Laterality Date  . ABDOMINAL AORTOGRAM W/LOWER EXTREMITY N/A 07/23/2018   Procedure: ABDOMINAL AORTOGRAM W/LOWER EXTREMITY;  Surgeon: Marty Heck, MD;  Location: [facility];  Service: Cardiovascular;  Laterality: N/A;  . BREAST EXCISIONAL BIOPSY Bilateral   . BREAST SURGERY    . CATARACT EXTRACTION     left   . ROTATOR CUFF REPAIR  1990's   left  . Thyroid radiation ablation     for Graves Disease  . TRANSTHORACIC ECHOCARDIOGRAM      EF55-65%, nml - 12/14/2004   Social History   Social History Narrative   3 daughters.  Takes care of 8 grandchildren at home.  Lots of stress.  no tobacco, no etoh.  Father is Cristy Hilts- deceased Nov 28, 2003. Sister -  Debra [place].         Health Care POA:    Emergency Contact: daughter, Janett Billow (c) [phone]   End of Life Plan:    Who lives with you: self   Any pets: none   Diet: Pt has a varied diet of protein starch and vegetables. Pt reports eating a lot of potatoes and starches.  Does not follow diabetic diet.    Exercise: Pt does exercises 2x a week at [employer].    Seatbelts: Pt reports wearing seatbelt when in vehicles.    Sun Exposure/Protection: Pt reports not using sun protection.    Hobbies:  Attends Estate manager/land agent through [employer] Monday through Friday 9-5pm.  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      Current Social History  03/06/2017   Who lives at home: Lives alone in one level home; grandson stays occasionally 03/06/2017    Transportation: Bus or taxi currently as car is not working 03/06/2017   Important Relationships & Pets: "Everybody I meet." No pets 03/06/2017    Current Stressors: Transportation, bus line 03/06/2017   Work / Education:  Retired/ 12 th grade 03/06/2017   Religious / Personal Beliefs: "I believe in God, [place], and the resurrection." 03/06/2017   Interests / Fun: crafts, yard work, going to [employer] 03/06/2017   L. Ducatte, RN, BSN                                                                                                          Immunization History  Administered Date(s) Administered  . Influenza Split 05/14/2012  . Influenza Whole 07/10/2007, 05/11/2008, 05/13/2008  . Influenza,inj,Quad PF,6+ Mos 06/20/2017, 06/02/2019  . Influenza-Unspecified 06/30/2018  . Pneumococcal Conjugate-13 01/14/2017  . Pneumococcal Polysaccharide-23 07/19/1994, 02/17/2018  . Td 10/22/2004  . Tdap 02/24/2018     Objective: Vital Signs: There were no vitals taken for this visit.   Physical Exam   Musculoskeletal Exam: ***  CDAI Exam: CDAI Score: -- Patient Global: --; Provider Global: -- Swollen: --; Tender: -- Joint Exam 09/07/2019   No joint exam has been documented for this visit   There is currently no information documented on the homunculus. Go to the Rheumatology activity and complete the homunculus joint exam.  Investigation: No additional findings.  Imaging: No results found.  Recent Labs: Lab Results  Component Value Date   WBC WILL FOLLOW 06/02/2019   HGB 10.6 (L) 08/23/2019   PLT WILL FOLLOW 06/02/2019   NA 140 07/23/2018   K 4.5 07/23/2018   CL 104 07/23/2018   CO2 28 06/24/2018   GLUCOSE 110 (H) 07/23/2018    BUN 37 (H) 07/23/2018   CREATININE 1.40 (H) 02/25/2019   BILITOT 0.3 11/18/2017   ALKPHOS 91 11/18/2017   AST 10 11/18/2017   ALT 7 11/18/2017   PROT 9.8 (H) 11/18/2017   ALBUMIN 3.2 (L) 11/18/2017   CALCIUM 9.2 06/24/2018   GFRAA 41 (L) 06/24/2018    Speciality Comments: No specialty comments available.  Procedures:  No procedures performed Allergies: Feraheme [ferumoxytol] and  Rosiglitazone maleate   Assessment / Plan:     Visit Diagnoses: No diagnosis found.  Orders: No orders of the defined types were placed in this encounter.  No orders of the defined types were placed in this encounter.   Face-to-face time spent with patient was *** minutes. Greater than 50% of time was spent in counseling and coordination of care.  Follow-Up Instructions: No follow-ups on file.   Bo Merino, MD  Note - This record has been created using Editor, commissioning.  Chart creation errors have been sought, but may not always  have been located. Such creation errors do not reflect on  the standard of medical care.

## 2019-09-07 ENCOUNTER — Ambulatory Visit: Payer: Medicare Other | Admitting: Rheumatology

## 2019-09-15 ENCOUNTER — Ambulatory Visit: Payer: Medicare Other | Admitting: Podiatry

## 2019-09-20 ENCOUNTER — Inpatient Hospital Stay (HOSPITAL_COMMUNITY): Admission: RE | Admit: 2019-09-20 | Payer: Medicare Other | Source: Ambulatory Visit

## 2019-09-21 ENCOUNTER — Telehealth: Payer: Self-pay | Admitting: *Deleted

## 2019-09-21 NOTE — Telephone Encounter (Signed)
Patient unable to have labs performed at this time. Patient is having wound care issues and cannot walk. Patient will come for labs and schedule f/u appt when she is able.

## 2019-09-23 ENCOUNTER — Inpatient Hospital Stay (HOSPITAL_COMMUNITY)
Admission: EM | Admit: 2019-09-23 | Discharge: 2019-10-02 | DRG: 240 | Disposition: A | Discharge status: Skilled Nursing Facility | Payer: Medicare Other | Attending: Family Medicine | Admitting: Family Medicine

## 2019-09-23 ENCOUNTER — Encounter (HOSPITAL_BASED_OUTPATIENT_CLINIC_OR_DEPARTMENT_OTHER): Payer: Medicare Other | Attending: Internal Medicine | Admitting: Internal Medicine

## 2019-09-23 ENCOUNTER — Emergency Department (HOSPITAL_COMMUNITY): Payer: Medicare Other

## 2019-09-23 ENCOUNTER — Other Ambulatory Visit: Payer: Self-pay

## 2019-09-23 ENCOUNTER — Encounter (HOSPITAL_COMMUNITY): Payer: Self-pay | Admitting: Emergency Medicine

## 2019-09-23 DIAGNOSIS — M8580 Other specified disorders of bone density and structure, unspecified site: Secondary | ICD-10-CM | POA: Diagnosis present

## 2019-09-23 DIAGNOSIS — S81802A Unspecified open wound, left lower leg, initial encounter: Secondary | ICD-10-CM | POA: Diagnosis not present

## 2019-09-23 DIAGNOSIS — S92001A Unspecified fracture of right calcaneus, initial encounter for closed fracture: Secondary | ICD-10-CM | POA: Diagnosis present

## 2019-09-23 DIAGNOSIS — E1169 Type 2 diabetes mellitus with other specified complication: Secondary | ICD-10-CM | POA: Diagnosis not present

## 2019-09-23 DIAGNOSIS — L97919 Non-pressure chronic ulcer of unspecified part of right lower leg with unspecified severity: Secondary | ICD-10-CM | POA: Diagnosis present

## 2019-09-23 DIAGNOSIS — I70235 Atherosclerosis of native arteries of right leg with ulceration of other part of foot: Secondary | ICD-10-CM | POA: Diagnosis not present

## 2019-09-23 DIAGNOSIS — Z681 Body mass index (BMI) 19 or less, adult: Secondary | ICD-10-CM | POA: Diagnosis not present

## 2019-09-23 DIAGNOSIS — E1136 Type 2 diabetes mellitus with diabetic cataract: Secondary | ICD-10-CM | POA: Diagnosis present

## 2019-09-23 DIAGNOSIS — E039 Hypothyroidism, unspecified: Secondary | ICD-10-CM | POA: Diagnosis not present

## 2019-09-23 DIAGNOSIS — Z7401 Bed confinement status: Secondary | ICD-10-CM | POA: Diagnosis not present

## 2019-09-23 DIAGNOSIS — Z743 Need for continuous supervision: Secondary | ICD-10-CM | POA: Diagnosis not present

## 2019-09-23 DIAGNOSIS — I739 Peripheral vascular disease, unspecified: Secondary | ICD-10-CM | POA: Diagnosis not present

## 2019-09-23 DIAGNOSIS — I998 Other disorder of circulatory system: Secondary | ICD-10-CM | POA: Diagnosis not present

## 2019-09-23 DIAGNOSIS — N6320 Unspecified lump in the left breast, unspecified quadrant: Secondary | ICD-10-CM | POA: Diagnosis not present

## 2019-09-23 DIAGNOSIS — S81801A Unspecified open wound, right lower leg, initial encounter: Secondary | ICD-10-CM | POA: Diagnosis not present

## 2019-09-23 DIAGNOSIS — R197 Diarrhea, unspecified: Secondary | ICD-10-CM | POA: Diagnosis not present

## 2019-09-23 DIAGNOSIS — Z79899 Other long term (current) drug therapy: Secondary | ICD-10-CM

## 2019-09-23 DIAGNOSIS — L089 Local infection of the skin and subcutaneous tissue, unspecified: Secondary | ICD-10-CM

## 2019-09-23 DIAGNOSIS — Z888 Allergy status to other drugs, medicaments and biological substances status: Secondary | ICD-10-CM

## 2019-09-23 DIAGNOSIS — I5030 Unspecified diastolic (congestive) heart failure: Secondary | ICD-10-CM | POA: Diagnosis present

## 2019-09-23 DIAGNOSIS — Z89421 Acquired absence of other right toe(s): Secondary | ICD-10-CM

## 2019-09-23 DIAGNOSIS — R634 Abnormal weight loss: Secondary | ICD-10-CM | POA: Diagnosis present

## 2019-09-23 DIAGNOSIS — M069 Rheumatoid arthritis, unspecified: Secondary | ICD-10-CM | POA: Diagnosis not present

## 2019-09-23 DIAGNOSIS — I70229 Atherosclerosis of native arteries of extremities with rest pain, unspecified extremity: Secondary | ICD-10-CM

## 2019-09-23 DIAGNOSIS — D509 Iron deficiency anemia, unspecified: Secondary | ICD-10-CM | POA: Diagnosis present

## 2019-09-23 DIAGNOSIS — E440 Moderate protein-calorie malnutrition: Secondary | ICD-10-CM | POA: Diagnosis not present

## 2019-09-23 DIAGNOSIS — M6281 Muscle weakness (generalized): Secondary | ICD-10-CM | POA: Diagnosis not present

## 2019-09-23 DIAGNOSIS — Z89611 Acquired absence of right leg above knee: Secondary | ICD-10-CM | POA: Diagnosis not present

## 2019-09-23 DIAGNOSIS — L97529 Non-pressure chronic ulcer of other part of left foot with unspecified severity: Secondary | ICD-10-CM | POA: Diagnosis not present

## 2019-09-23 DIAGNOSIS — Z803 Family history of malignant neoplasm of breast: Secondary | ICD-10-CM

## 2019-09-23 DIAGNOSIS — E1142 Type 2 diabetes mellitus with diabetic polyneuropathy: Secondary | ICD-10-CM | POA: Diagnosis not present

## 2019-09-23 DIAGNOSIS — R7881 Bacteremia: Secondary | ICD-10-CM | POA: Diagnosis present

## 2019-09-23 DIAGNOSIS — S78111D Complete traumatic amputation at level between right hip and knee, subsequent encounter: Secondary | ICD-10-CM | POA: Diagnosis not present

## 2019-09-23 DIAGNOSIS — I70263 Atherosclerosis of native arteries of extremities with gangrene, bilateral legs: Secondary | ICD-10-CM | POA: Diagnosis not present

## 2019-09-23 DIAGNOSIS — I509 Heart failure, unspecified: Secondary | ICD-10-CM | POA: Diagnosis not present

## 2019-09-23 DIAGNOSIS — Z833 Family history of diabetes mellitus: Secondary | ICD-10-CM

## 2019-09-23 DIAGNOSIS — M7989 Other specified soft tissue disorders: Secondary | ICD-10-CM | POA: Diagnosis not present

## 2019-09-23 DIAGNOSIS — Z79890 Hormone replacement therapy (postmenopausal): Secondary | ICD-10-CM

## 2019-09-23 DIAGNOSIS — Z20822 Contact with and (suspected) exposure to covid-19: Secondary | ICD-10-CM | POA: Diagnosis present

## 2019-09-23 DIAGNOSIS — M2550 Pain in unspecified joint: Secondary | ICD-10-CM | POA: Diagnosis not present

## 2019-09-23 DIAGNOSIS — F21 Schizotypal disorder: Secondary | ICD-10-CM | POA: Diagnosis present

## 2019-09-23 DIAGNOSIS — Z800 Family history of malignant neoplasm of digestive organs: Secondary | ICD-10-CM

## 2019-09-23 DIAGNOSIS — I110 Hypertensive heart disease with heart failure: Secondary | ICD-10-CM | POA: Diagnosis not present

## 2019-09-23 DIAGNOSIS — D649 Anemia, unspecified: Secondary | ICD-10-CM | POA: Diagnosis not present

## 2019-09-23 DIAGNOSIS — M869 Osteomyelitis, unspecified: Secondary | ICD-10-CM | POA: Diagnosis not present

## 2019-09-23 DIAGNOSIS — Z4781 Encounter for orthopedic aftercare following surgical amputation: Secondary | ICD-10-CM | POA: Diagnosis not present

## 2019-09-23 DIAGNOSIS — R0600 Dyspnea, unspecified: Secondary | ICD-10-CM | POA: Diagnosis present

## 2019-09-23 DIAGNOSIS — D62 Acute posthemorrhagic anemia: Secondary | ICD-10-CM | POA: Diagnosis not present

## 2019-09-23 DIAGNOSIS — M1611 Unilateral primary osteoarthritis, right hip: Secondary | ICD-10-CM | POA: Diagnosis not present

## 2019-09-23 DIAGNOSIS — K219 Gastro-esophageal reflux disease without esophagitis: Secondary | ICD-10-CM | POA: Diagnosis not present

## 2019-09-23 DIAGNOSIS — E1152 Type 2 diabetes mellitus with diabetic peripheral angiopathy with gangrene: Secondary | ICD-10-CM | POA: Diagnosis not present

## 2019-09-23 DIAGNOSIS — E11621 Type 2 diabetes mellitus with foot ulcer: Secondary | ICD-10-CM | POA: Diagnosis not present

## 2019-09-23 DIAGNOSIS — L97519 Non-pressure chronic ulcer of other part of right foot with unspecified severity: Secondary | ICD-10-CM | POA: Diagnosis not present

## 2019-09-23 DIAGNOSIS — M1711 Unilateral primary osteoarthritis, right knee: Secondary | ICD-10-CM | POA: Diagnosis present

## 2019-09-23 DIAGNOSIS — Z8249 Family history of ischemic heart disease and other diseases of the circulatory system: Secondary | ICD-10-CM

## 2019-09-23 DIAGNOSIS — Z03818 Encounter for observation for suspected exposure to other biological agents ruled out: Secondary | ICD-10-CM | POA: Diagnosis not present

## 2019-09-23 DIAGNOSIS — R918 Other nonspecific abnormal finding of lung field: Secondary | ICD-10-CM | POA: Diagnosis not present

## 2019-09-23 DIAGNOSIS — F05 Delirium due to known physiological condition: Secondary | ICD-10-CM | POA: Diagnosis not present

## 2019-09-23 DIAGNOSIS — I70234 Atherosclerosis of native arteries of right leg with ulceration of heel and midfoot: Secondary | ICD-10-CM | POA: Diagnosis not present

## 2019-09-23 DIAGNOSIS — Z89612 Acquired absence of left leg above knee: Secondary | ICD-10-CM | POA: Diagnosis not present

## 2019-09-23 DIAGNOSIS — T148XXA Other injury of unspecified body region, initial encounter: Secondary | ICD-10-CM

## 2019-09-23 DIAGNOSIS — S91301A Unspecified open wound, right foot, initial encounter: Secondary | ICD-10-CM | POA: Diagnosis not present

## 2019-09-23 DIAGNOSIS — I70245 Atherosclerosis of native arteries of left leg with ulceration of other part of foot: Secondary | ICD-10-CM | POA: Diagnosis not present

## 2019-09-23 DIAGNOSIS — I70244 Atherosclerosis of native arteries of left leg with ulceration of heel and midfoot: Secondary | ICD-10-CM | POA: Diagnosis not present

## 2019-09-23 DIAGNOSIS — E1121 Type 2 diabetes mellitus with diabetic nephropathy: Secondary | ICD-10-CM | POA: Diagnosis not present

## 2019-09-23 DIAGNOSIS — R110 Nausea: Secondary | ICD-10-CM | POA: Diagnosis not present

## 2019-09-23 DIAGNOSIS — I70201 Unspecified atherosclerosis of native arteries of extremities, right leg: Secondary | ICD-10-CM | POA: Diagnosis not present

## 2019-09-23 DIAGNOSIS — S78112D Complete traumatic amputation at level between left hip and knee, subsequent encounter: Secondary | ICD-10-CM | POA: Diagnosis not present

## 2019-09-23 DIAGNOSIS — I70202 Unspecified atherosclerosis of native arteries of extremities, left leg: Secondary | ICD-10-CM | POA: Diagnosis not present

## 2019-09-23 DIAGNOSIS — I10 Essential (primary) hypertension: Secondary | ICD-10-CM | POA: Diagnosis not present

## 2019-09-23 DIAGNOSIS — N189 Chronic kidney disease, unspecified: Secondary | ICD-10-CM | POA: Diagnosis not present

## 2019-09-23 DIAGNOSIS — E785 Hyperlipidemia, unspecified: Secondary | ICD-10-CM | POA: Diagnosis not present

## 2019-09-23 DIAGNOSIS — B9689 Other specified bacterial agents as the cause of diseases classified elsewhere: Secondary | ICD-10-CM | POA: Diagnosis present

## 2019-09-23 DIAGNOSIS — W19XXXA Unspecified fall, initial encounter: Secondary | ICD-10-CM

## 2019-09-23 LAB — COMPREHENSIVE METABOLIC PANEL
ALT: 10 U/L (ref 0–44)
AST: 12 U/L — ABNORMAL LOW (ref 15–41)
Albumin: 2.5 g/dL — ABNORMAL LOW (ref 3.5–5.0)
Alkaline Phosphatase: 101 U/L (ref 38–126)
Anion gap: 10 (ref 5–15)
BUN: 33 mg/dL — ABNORMAL HIGH (ref 8–23)
CO2: 26 mmol/L (ref 22–32)
Calcium: 8.8 mg/dL — ABNORMAL LOW (ref 8.9–10.3)
Chloride: 101 mmol/L (ref 98–111)
Creatinine, Ser: 1.47 mg/dL — ABNORMAL HIGH (ref 0.44–1.00)
GFR calc Af Amer: 41 mL/min — ABNORMAL LOW (ref >60)
GFR calc non Af Amer: 35 mL/min — ABNORMAL LOW (ref >60)
Glucose, Bld: 109 mg/dL — ABNORMAL HIGH (ref 70–99)
Potassium: 4.8 mmol/L (ref 3.5–5.1)
Sodium: 137 mmol/L (ref 135–145)
Total Bilirubin: 1 mg/dL (ref 0.3–1.2)
Total Protein: 9 g/dL — ABNORMAL HIGH (ref 6.5–8.1)

## 2019-09-23 LAB — CBC WITH DIFFERENTIAL/PLATELET
Abs Immature Granulocytes: 0.07 10*3/uL (ref 0.00–0.07)
Basophils Absolute: 0 10*3/uL (ref 0.0–0.1)
Basophils Relative: 0 %
Eosinophils Absolute: 0 10*3/uL (ref 0.0–0.5)
Eosinophils Relative: 0 %
HCT: 38.4 % (ref 36.0–46.0)
Hemoglobin: 11.1 g/dL — ABNORMAL LOW (ref 12.0–15.0)
Immature Granulocytes: 1 %
Lymphocytes Relative: 9 %
Lymphs Abs: 1.1 10*3/uL (ref 0.7–4.0)
MCH: 26 pg (ref 26.0–34.0)
MCHC: 28.9 g/dL — ABNORMAL LOW (ref 30.0–36.0)
MCV: 89.9 fL (ref 80.0–100.0)
Monocytes Absolute: 0.5 10*3/uL (ref 0.1–1.0)
Monocytes Relative: 4 %
Neutro Abs: 10.6 10*3/uL — ABNORMAL HIGH (ref 1.7–7.7)
Neutrophils Relative %: 86 %
Platelets: 344 10*3/uL (ref 150–400)
RBC: 4.27 MIL/uL (ref 3.87–5.11)
RDW: 18.3 % — ABNORMAL HIGH (ref 11.5–15.5)
WBC: 12.3 10*3/uL — ABNORMAL HIGH (ref 4.0–10.5)
nRBC: 0 % (ref 0.0–0.2)

## 2019-09-23 LAB — I-STAT CHEM 8, ED
BUN: 42 mg/dL — ABNORMAL HIGH (ref 8–23)
Calcium, Ion: 1.03 mmol/L — ABNORMAL LOW (ref 1.15–1.40)
Chloride: 104 mmol/L (ref 98–111)
Creatinine, Ser: 1.4 mg/dL — ABNORMAL HIGH (ref 0.44–1.00)
Glucose, Bld: 95 mg/dL (ref 70–99)
HCT: 38 % (ref 36.0–46.0)
Hemoglobin: 12.9 g/dL (ref 12.0–15.0)
Potassium: 5.7 mmol/L — ABNORMAL HIGH (ref 3.5–5.1)
Sodium: 133 mmol/L — ABNORMAL LOW (ref 135–145)
TCO2: 26 mmol/L (ref 22–32)

## 2019-09-23 LAB — RESPIRATORY PANEL BY RT PCR (FLU A&B, COVID)
Influenza A by PCR: NEGATIVE
Influenza B by PCR: NEGATIVE
SARS Coronavirus 2 by RT PCR: NEGATIVE

## 2019-09-23 LAB — LACTIC ACID, PLASMA
Lactic Acid, Venous: 1.6 mmol/L (ref 0.5–1.9)
Lactic Acid, Venous: 2.1 mmol/L (ref 0.5–1.9)

## 2019-09-23 MED ORDER — ROSUVASTATIN CALCIUM 20 MG PO TABS
20.0000 mg | ORAL_TABLET | Freq: Every day | ORAL | Status: DC
  Administered 2019-09-24 – 2019-10-02 (×9): 20 mg via ORAL
  Filled 2019-09-23 (×9): qty 1

## 2019-09-23 MED ORDER — FERROUS SULFATE 325 (65 FE) MG PO TABS
325.0000 mg | ORAL_TABLET | ORAL | Status: DC
  Administered 2019-09-24 – 2019-10-02 (×6): 325 mg via ORAL
  Filled 2019-09-23 (×6): qty 1

## 2019-09-23 MED ORDER — LEVOTHYROXINE SODIUM 75 MCG PO TABS
150.0000 ug | ORAL_TABLET | Freq: Every day | ORAL | Status: DC
  Administered 2019-09-24 – 2019-10-02 (×9): 150 ug via ORAL
  Filled 2019-09-23 (×9): qty 2

## 2019-09-23 MED ORDER — METOLAZONE 5 MG PO TABS: 5.0000 mg | ORAL_TABLET | ORAL | Status: DC

## 2019-09-23 MED ORDER — PIPERACILLIN-TAZOBACTAM 3.375 G IVPB 30 MIN
3.3750 g | Freq: Once | INTRAVENOUS | Status: AC
  Administered 2019-09-23: 3.375 g via INTRAVENOUS
  Filled 2019-09-23: qty 50

## 2019-09-23 MED ORDER — FENTANYL CITRATE (PF) 100 MCG/2ML IJ SOLN
25.0000 ug | Freq: Once | INTRAMUSCULAR | Status: AC
  Administered 2019-09-23: 22:00:00 25 ug via INTRAVENOUS
  Filled 2019-09-23: qty 2

## 2019-09-23 MED ORDER — FENTANYL CITRATE (PF) 100 MCG/2ML IJ SOLN
25.0000 ug | Freq: Once | INTRAMUSCULAR | Status: AC
  Administered 2019-09-23: 18:00:00 25 ug via INTRAVENOUS
  Filled 2019-09-23: qty 2

## 2019-09-23 MED ORDER — SODIUM CHLORIDE 0.9 % IV BOLUS
1000.0000 mL | Freq: Once | INTRAVENOUS | Status: AC
  Administered 2019-09-23: 1000 mL via INTRAVENOUS

## 2019-09-23 MED ORDER — SODIUM CHLORIDE 0.9 % IV SOLN: INTRAVENOUS | Status: DC

## 2019-09-23 MED ORDER — FUROSEMIDE 10 MG/ML IJ SOLN: 80.0000 mg | Freq: Two times a day (BID) | INTRAMUSCULAR | Status: DC

## 2019-09-23 MED ORDER — FENTANYL CITRATE (PF) 100 MCG/2ML IJ SOLN: 50.0000 ug | Freq: Once | INTRAMUSCULAR | Status: DC

## 2019-09-23 MED ORDER — ALBUTEROL SULFATE (2.5 MG/3ML) 0.083% IN NEBU: 3.0000 mL | INHALATION_SOLUTION | Freq: Four times a day (QID) | RESPIRATORY_TRACT | Status: DC | PRN

## 2019-09-23 MED ORDER — SODIUM CHLORIDE 0.9 % IV BOLUS: 500.0000 mL | Freq: Once | INTRAVENOUS | Status: DC

## 2019-09-23 NOTE — Progress Notes (Addendum)
A consult was received from an ED provider for piperacillin/tazobactam per pharmacy dosing.  The patient's profile has been reviewed for ht/wt/allergies/indication/available labs.  No wt in chart, placed order for ht/wt.   A one time order has been placed for piperacillin/tazobactam 3.375 g IV once.    Further antibiotics/pharmacy consults should be ordered by admitting physician if indicated.                       Thank you, Lenis Noon, PharmD 09/23/2019  4:08 PM  Continue zosyn 3.375 gm IV q8 hours

## 2019-09-23 NOTE — Progress Notes (Signed)
[name], [name] ([id number]) Visit Report for 09/23/2019 Arrival Information Details Patient Name: Date of Service: [name], [name] 09/23/2019 1:15 PM Medical Record [id number] Patient Account Number: [id number] Date of Birth/Sex: Treating RN: [birth year] (74 y.o. Clearnce Sorrel Primary Care Selah Zelman: Chrisandra Netters Other Clinician: Referring Montanna Mcbain: Treating Silveria Botz/Extender:Robson, Nonda Lou, Ivan Anchors in Treatment: 9 Visit Information History Since Last Visit Pain Present Now: Yes Patient Arrived: Wheel Chair Arrival Time: 14:08 Accompanied By: grandson Transfer Assistance: Manual Patient Identification Verified: Yes Secondary Verification Process Yes Completed: Patient Requires Transmission- No Based Precautions: Patient Has Alerts: Yes Patient Alerts: right non compressable Electronic Signature(s) Signed: 09/23/2019 5:44:01 PM By: Kela Millin Entered By: Kela Millin on 09/23/2019 14:10:24 -------------------------------------------------------------------------------- Clinic Level of Care Assessment Details Patient Name: Date of Service: [name], [name] 09/23/2019 1:15 PM Medical Record [id number] Patient Account Number: [id number] Date of Birth/Sex: Treating RN: [birth year] (74 y.o. [name] Primary Care Mellie Buccellato: Chrisandra Netters Other Clinician: Referring Debrah Granderson: Treating Keeven Matty/Extender:Robson, Nonda Lou, Ivan Anchors in Treatment: 9 Clinic Level of Care Assessment Items TOOL 2 Quantity Score X - Use when only an EandM is performed on the INITIAL visit 1 0 ASSESSMENTS - Nursing Assessment / Reassessment X - General Physical Exam (combine w/ comprehensive assessment (listed just below) X - General Physical Exam (combine w/ comprehensive assessment (listed just below) 1 20 when performed on new pt. evals) X - Comprehensive Assessment (HX, ROS, Risk Assessments, Wounds Hx, etc.) 1 25 ASSESSMENTS -  Wound and Skin Assessment / Reassessment []  - Simple Wound Assessment / Reassessment - one wound 0 []  - Complex Wound Assessment / Reassessment - multiple wounds 0 X - Dermatologic / Skin Assessment (not related to wound area) 1 10 ASSESSMENTS - Ostomy and/or Continence Assessment and Care []  - Incontinence Assessment and Management 0 []  - Ostomy Care Assessment and Management (repouching, etc.) 0 PROCESS - Coordination of Care []  - Simple Patient / Family Education for ongoing care 0 X - Complex (extensive) Patient / Family Education for ongoing care 1 20 X - Staff obtains Programmer, systems, Records, Test Results / Process Orders 1 10 []  - Staff telephones HHA, Nursing Homes / Clarify orders / etc 0 []  - Routine Transfer to another Facility (non-emergent condition) 0 []  - Routine Hospital Admission (non-emergent condition) 0 X - New Admissions / Biomedical engineer / Ordering NPWT, Apligraf, etc. 1 15 X - Emergency Hospital Admission (emergent condition) 1 20 []  - Simple Discharge Coordination 0 X - Complex (extensive) Discharge Coordination 1 15 PROCESS - Special Needs []  - Pediatric / Minor Patient Management 0 []  - Isolation Patient Management 0 []  - Hearing / Language / Visual special needs 0 []  - Assessment of Community assistance (transportation, D/C planning, etc.) 0 []  - Additional assistance / Altered mentation 0 []  - Support Surface(s) Assessment (bed, cushion, seat, etc.) 0 INTERVENTIONS - Wound Cleansing / Measurement []  - Wound Imaging (photographs - any number of wounds) 0 []  - Wound Tracing (instead of photographs) 0 []  - Simple Wound Measurement - one wound 0 []  - Complex Wound Measurement - multiple wounds 0 []  - Simple Wound Cleansing - one wound 0 []  - Complex Wound Cleansing - multiple wounds 0 INTERVENTIONS - Wound Dressings []  - Small Wound Dressing one or multiple wounds 0 []  - Medium Wound Dressing one or multiple wounds 0 X - Large Wound Dressing one or  multiple wounds 2 20 []  - Application of Medications - injection 0 INTERVENTIONS - Miscellaneous []  - External ear exam  0 []  - Specimen Collection (cultures, biopsies, blood, body fluids, etc.) 0 []  - Specimen(s) / Culture(s) sent or taken to Lab for analysis 0 []  - Patient Transfer (multiple staff / Harrel Lemon Lift / Similar devices) 0 []  - Simple Staple / Suture removal (25 or less) 0 []  - Complex Staple / Suture removal (26 or more) 0 []  - Hypo / Hyperglycemic Management (close monitor of Blood Glucose) 0 []  - Ankle / Brachial Index (ABI) - do not check if billed separately 0 Has the patient been seen at the hospital within the last three years: Yes Total Score: 175 Level Of Care: New/Established - Level 5 Electronic Signature(s) Signed: 09/23/2019 5:56:18 PM By: Deon Pilling Entered By: Deon Pilling on 09/23/2019 14:25:24 -------------------------------------------------------------------------------- Encounter Discharge Information Details Patient Name: Date of Service: [name] 09/23/2019 1:15 PM Medical Record [id number] Patient Account Number: [id number] Date of Birth/Sex: Treating RN: 04-08-1946 (74 y.o. [name] Primary Care Levon Penning: Chrisandra Netters Other Clinician: Referring Laquinda Moller: Treating Oakley Kossman/Extender:Robson, Nonda Lou, Ivan Anchors in Treatment: 9 Encounter Discharge Information Items Discharge Condition: Stable Ambulatory Status: Wheelchair Discharge Destination: Emergency Room Telephoned: Yes Spoke With: MD spoke with charge nurse Orders Sent: Yes Transportation: Other grandson and EMT from wound Accompanied By: center to ED. Schedule Follow-up No Appointment: Clinical Summary of Care: Notes abd pad and kerlix applied to bilateral feet and legs. patient sent to ED via wheelchair from wound center to ED accompanied by EMT and grandson. Electronic Signature(s) Signed: 09/23/2019 5:56:18 PM By: Deon Pilling Entered By:  Deon Pilling on 09/23/2019 15:50:31 -------------------------------------------------------------------------------- [place] Details Patient Name: Date of Service: [name], [name] 09/23/2019 1:15 PM Medical Record [id number] Patient Account Number: [id number] Date of Birth/Sex: Treating RN: Jan 19, 1946 (74 y.o. [name] Primary Care Jaylei Fuerte: Chrisandra Netters Other Clinician: Referring Kortney Potvin: Treating Nawal Burling/Extender:Robson, Nonda Lou, Ivan Anchors in Treatment: 9 Active Inactive Electronic Signature(s) Signed: 09/23/2019 5:56:18 PM By: Deon Pilling Entered By: Deon Pilling on 09/23/2019 14:24:01 -------------------------------------------------------------------------------- Patient/Caregiver Education Details Patient Name: Date of Service: [name] 2/4/2021andnbsp1:15 PM Medical Record [id number] Patient Account Number: [id number] Date of Birth/Gender: 10/14/1945 (73 y.o. F) Treating RN: Deon Pilling Primary Care Physician: Chrisandra Netters Other Clinician: Referring Physician: Treating Physician/Extender:Robson, Nonda Lou, Ivan Anchors in Treatment: 9 Education Assessment Education Provided To: Patient and Caregiver Education Topics Provided Infection: Handouts: CDC antimicrobial patient education_English, Infection Prevention and Management Methods: Explain/Verbal, Printed Responses: Reinforcements needed Electronic Signature(s) Signed: 09/23/2019 5:56:18 PM By: Deon Pilling Entered By: Deon Pilling on 09/23/2019 14:24:25 -------------------------------------------------------------------------------- Vitals Details Patient Name: Date of Service: [name] 09/23/2019 1:15 PM Medical Record [id number] Patient Account Number: [id number] Date of Birth/Sex: Treating RN: [birth year] (74 y.o. Clearnce Sorrel Primary Care Selisa Tensley: Chrisandra Netters Other Clinician: Referring  Sanvi Ehler: Treating Marga Gramajo/Extender:Robson, Nonda Lou, Ivan Anchors in Treatment: 9 Vital Signs Time Taken: 14:00 Temperature (F): 96.9 Height (in): 62 Pulse (bpm): 79 Weight (lbs): 140 Respiratory Rate (breaths/min): 19 Body Mass Index (BMI): 25.6 Blood Pressure (mmHg): 142/73 Reference Range: 80 - 120 mg / dl Notes patient presented to clinic with grocery bags on bilateral feet, right foot noted to have soaked/pooling blood in bag. Patient complains of chills, fatigue, pain, and weakness. Right foot noted to have 6 wounds with necrosis, foul odor, purulent drainage. Left foot noted to have 4 wounds with necrosis, foul odor with purulent drainage. Bilateral legs have circumferential wounds with scattered blisters, scattered necrosis, with edema and redness. Attempted temperature check x3 and was able to get 96.9  axillary. MD to assess and evaluate. Electronic Signature(s) Signed: 09/23/2019 5:44:01 PM By: Kela Millin Entered By: Kela Millin on 09/23/2019 14:08:02

## 2019-09-23 NOTE — ED Provider Notes (Signed)
[facility] DEPT Provider Note   CSN: [id number] Arrival date & time: 09/23/19  1506     History Chief Complaint  Patient presents with  . wounds  . vascular issue    [name] is a 74 y.o. female with a past medical history of hypertension, hyperlipidemia, diabetes presenting to the ED for concerns for wound infection.  Patient has had these wounds to bilateral lower extremities for the past several weeks.  However she was discharged from wound care center because of improvement in her wounds in December.  She presented back to the wound care center today and was sent to the ED for concerns for worsening wounds and vascular issues.  Reports generalized pain throughout her lower extremities.  She denies any fever, injuries or falls, changes to sensation. She remains ambulatory with pain 2/2 her wounds. Denies abdominal pain, constipation, vomiting, shortness of breath or cough.  HPI     Past Medical History:  Diagnosis Date  . Bilateral lower extremity edema 06/06/2014  . Cataracts, bilateral 02/13/2011   Seen on eye exam 02/07/11. F/u in 12 months   . Diabetes mellitus age 61  . Diabetic ulcer of toe ([place]) 12/12/2015  . Hyperlipidemia   . Hypertension   . Retinal detachment, old, partial    left  . Retinopathy due to secondary diabetes mellitus ([place])    L>R, laser 3/07  . Schizotypal personality disorder ([place])   . Thyroid disease   . Weight loss 01/22/2012    Patient Active Problem List   Diagnosis Date Noted  . Left breast mass 06/02/2019  . Stress 06/02/2019  . PAD (peripheral artery disease) ([place]) 07/22/2018  . Wound of lower extremity, right, initial encounter 06/24/2018  . Dyspnea 03/23/2018  . Impaired functional mobility, balance, gait, and endurance 03/02/2018    Class: Diagnosis of  . Abnormality of gait 02/26/2018  . Iron deficiency anemia 03/19/2017  . Vision disturbance 03/20/2015  . Memory difficulty 03/20/2015  . At high  risk for falls 11/10/2012  . DIABETIC  RETINOPATHY 03/17/2007  . DIABETIC PERIPHERAL NEUROPATHY 03/17/2007  . Hypothyroidism 10/16/2006  . Type 2 diabetes mellitus ([place]) 10/16/2006  . OBESITY, NOS 10/16/2006  . HYPERTENSION, BENIGN SYSTEMIC 10/16/2006  . GASTROESOPHAGEAL REFLUX, NO ESOPHAGITIS 10/16/2006  . Rheumatoid arthritis ([place]) 10/16/2006  . OSTEOPENIA 10/16/2006  . INCONTINENCE, URGE 10/16/2006    Past Surgical History:  Procedure Laterality Date  . ABDOMINAL AORTOGRAM W/LOWER EXTREMITY N/A 07/23/2018   Procedure: ABDOMINAL AORTOGRAM W/LOWER EXTREMITY;  Surgeon: Marty Heck, MD;  Location: [facility];  Service: Cardiovascular;  Laterality: N/A;  . BREAST EXCISIONAL BIOPSY Bilateral   . BREAST SURGERY    . CATARACT EXTRACTION     left   . ROTATOR CUFF REPAIR  1990's   left  . Thyroid radiation ablation     for Graves Disease  . TRANSTHORACIC ECHOCARDIOGRAM      EF55-65%, nml - 12/14/2004     OB History   No obstetric history on file.     Family History  Problem Relation Age of Onset  . Heart disease Mother   . Hypertension Mother   . Heart attack Mother   . Colon cancer Brother   . Colon cancer Maternal Grandmother   . Pulmonary fibrosis Father   . Diabetes Daughter   . Liver disease Sister        transplant, liver  . Liver cancer Brother   . Breast cancer Niece  Social History   Tobacco Use  . Smoking status: Never Smoker  . Smokeless tobacco: Never Used  Substance Use Topics  . Alcohol use: No  . Drug use: No    Home Medications Prior to Admission medications   Medication Sig Start Date End Date Taking? Authorizing Provider  ferrous sulfate 325 (65 FE) MG tablet Take 1 tablet (325 mg total) by mouth every other day. 08/31/18  Yes [name], MD  furosemide (LASIX) 80 MG tablet Take 160 mg by mouth 2 (two) times a day.    Yes [provider]  levothyroxine (SYNTHROID) 150 MCG tablet Take 1 tablet (150 mcg  total) by mouth daily. 07/23/19  Yes [name], MD  metolazone (ZAROXOLYN) 5 MG tablet Take 5 mg by mouth 2 (two) times a week. 07/16/19  Yes [provider]  PROAIR HFA 108 (90 Base) MCG/ACT inhaler INHALE 2 PUFFS INTO THE LUNGS EVERY 6 HOURS AS NEEDED FOR WHEEZING OR SHORTNESS OF BREATH 08/31/18  Yes [name], MD  rosuvastatin (CRESTOR) 20 MG tablet Take 1 tablet (20 mg total) by mouth daily. 03/16/19  Yes [name], MD  Blood Glucose Monitoring Suppl [facility] VERIO) w/Device KIT Check sugar 3 times per day 03/16/19   [name], MD  glucose blood [facility] VERIO) test strip Check blood sugar daily 01/02/18   [name], MD  Lancet Devices (MICROLET NEXT LANCING DEVICE) MISC 1 each by Does not apply route 3 (three) times daily. Check blood sugar 3 times daily 12/05/16   Virginia Crews, MD  [facility] DELICA LANCETS 37J MISC Check blood sugar once daily 08/31/18   [name], MD    Allergies    Feraheme [ferumoxytol] and Rosiglitazone maleate  Review of Systems   Review of Systems  Constitutional: Negative for appetite change, chills and fever.  HENT: Negative for ear pain, rhinorrhea, sneezing and sore throat.   Eyes: Negative for photophobia and visual disturbance.  Respiratory: Negative for cough, chest tightness, shortness of breath and wheezing.   Cardiovascular: Negative for chest pain and palpitations.  Gastrointestinal: Negative for abdominal pain, blood in stool, constipation, diarrhea, nausea and vomiting.  Genitourinary: Negative for dysuria, hematuria and urgency.  Musculoskeletal: Negative for myalgias.  Skin: Positive for wound. Negative for rash.  Neurological: Negative for dizziness, weakness and light-headedness.    Physical Exam Updated Vital Signs BP 113/69   Pulse 93   Temp 98.4 F (36.9 C) (Oral)   Resp 18   SpO2 100%   Physical Exam Vitals and nursing note reviewed.  Constitutional:       General: She is not in acute distress.    Appearance: She is well-developed.  HENT:     Head: Normocephalic and atraumatic.     Nose: Nose normal.  Eyes:     General: No scleral icterus.       Left eye: No discharge.     Conjunctiva/sclera: Conjunctivae normal.  Cardiovascular:     Rate and Rhythm: Normal rate and regular rhythm.     Heart sounds: Normal heart sounds. No murmur. No friction rub. No gallop.   Pulmonary:     Effort: Pulmonary effort is normal. No respiratory distress.     Breath sounds: Normal breath sounds.  Abdominal:     General: Bowel sounds are normal. There is no distension.     Palpations: Abdomen is soft.     Tenderness: There is no abdominal tenderness. There is no guarding.  Musculoskeletal:        General: Normal range of motion.     Cervical back: Normal range of motion and neck supple.     Right lower leg: Edema present.     Left lower leg: Edema present.  Skin:    General: Skin is warm and dry.     Findings: Erythema and wound present. No rash.     Comments: DP pulses noted with Doppler.  Physical exam findings noted in images below.  There are several draining and open wounds noted to bilateral lower extremities with associated edema, erythema and warmth.  Neurological:     Mental Status: She is alert.     Motor: No abnormal muscle tone.     Coordination: Coordination normal.             ED Results / Procedures / Treatments   Labs (all labs ordered are listed, but only abnormal results are displayed) Labs Reviewed  COMPREHENSIVE METABOLIC PANEL - Abnormal; Notable for the following components:      Result Value   Glucose, Bld 109 (*)    BUN 33 (*)    Creatinine, Ser 1.47 (*)    Calcium 8.8 (*)    Total Protein 9.0 (*)    Albumin 2.5 (*)    AST 12 (*)    GFR calc non Af Amer 35 (*)    GFR calc Af Amer 41 (*)    All other components within normal limits  CBC WITH DIFFERENTIAL/PLATELET - Abnormal; Notable for the following  components:   WBC 12.3 (*)    Hemoglobin 11.1 (*)    MCHC 28.9 (*)    RDW 18.3 (*)    Neutro Abs 10.6 (*)    All other components within normal limits  LACTIC ACID, PLASMA - Abnormal; Notable for the following components:   Lactic Acid, Venous 2.1 (*)    All other components within normal limits  I-STAT CHEM 8, ED - Abnormal; Notable for the following components:   Sodium 133 (*)    Potassium 5.7 (*)    BUN 42 (*)    Creatinine, Ser 1.40 (*)    Calcium, Ion 1.03 (*)    All other components within normal limits  CULTURE, BLOOD (ROUTINE X 2)  CULTURE, BLOOD (ROUTINE X 2)  RESPIRATORY PANEL BY RT PCR (FLU A&B, COVID)  LACTIC ACID, PLASMA    EKG None  Radiology DG Chest 2 View  Result Date: 09/23/2019 CLINICAL DATA:  No pulses in bilateral feet. EXAM: CHEST - 2 VIEW COMPARISON:  None. FINDINGS: Moderate severity infiltrate is seen within the mid and lower left lung. Increased fibrotic changes are also suspected within the left lung base. There is no evidence of a pleural effusion or pneumothorax. The cardiac silhouette is borderline in size. There is moderate severity calcification of the aortic arch the visualized skeletal structures are unremarkable. Is single dilated small bowel loop is seen within the left upper quadrant. IMPRESSION: 1. Moderate severity infiltrate within the mid and lower left lung with increased fibrotic changes also suspected within the left lung base. 2. Single dilated small bowel loop within the left upper quadrant. Correlation with abdomen pelvis plain films is recommended if a small bowel obstruction or ileus is of clinical concern. Electronically Signed   By: Virgina Norfolk M.D.   On: 09/23/2019 17:50   DG Tibia/Fibula Left  Result Date: 09/23/2019 CLINICAL DATA:  No pulses in bilateral feet. EXAM: LEFT TIBIA AND FIBULA - 2  VIEW COMPARISON:  None. FINDINGS: There is no evidence of fracture or other focal bone lesions. Moderate severity diffuse soft tissue  swelling is seen with mild to moderate severity vascular calcification. IMPRESSION: Diffuse soft tissue swelling without evidence of an acute osseous abnormality. Electronically Signed   By: Virgina Norfolk M.D.   On: 09/23/2019 17:35   DG Tibia/Fibula Right  Result Date: 09/23/2019 CLINICAL DATA:  Wound care. Right upper thigh pain. EXAM: RIGHT TIBIA AND FIBULA - 2 VIEW COMPARISON:  None. FINDINGS: The bones are diffusely osteopenic. Marked lateral compartment osteoarthritis is identified. Vascular calcifications. No fracture or dislocation identified. Mild diffuse soft tissue swelling. A comminuted fracture deformity involving the right calcaneus is identified. The fracture lines appear distinct suggesting acute fracture. IMPRESSION: 1. Comminuted fracture deformity involves the right calcaneus. 2. Right knee osteoarthritis. 3. Osteopenia Electronically Signed   By: Kerby Moors M.D.   On: 09/23/2019 17:47   DG Foot Complete Left  Result Date: 09/23/2019 CLINICAL DATA:  No pulses in bilateral feet. EXAM: LEFT FOOT - COMPLETE 3+ VIEW COMPARISON:  None. FINDINGS: Mild to moderate severity degenerative changes seen involving the mid left foot, involving the visualized subtalar, talonavicular, calcaneocuboid, tarsal and tarsometatarsal articulations. Chronic and degenerative changes are seen along the base of the first left metatarsal. Chronic changes are also seen along the distal aspect of the fifth left metatarsal. Mild to moderate severity degenerative changes are seen involving the metatarsophalangeal joint of the left great toe. Normal tibial and fibular sesamoid bones. Normal interphalangeal joint of the left great toe. Normal phalanges of the left great toe. Normal second through fifth metatarsophalangeal joints. Normal interphalangeal joints of the lesser toes. Normal phalanges of the lesser toes. Moderate severity diffuse soft tissue swelling is seen. A 2.5 cm superficial soft tissue ulceration is  seen along the plantar aspect of the proximal left foot. IMPRESSION: 1. Chronic and degenerative changes without evidence of acute osseous abnormality. 2. Area of soft tissue ulceration along the plantar aspect of the proximal left foot. Electronically Signed   By: Virgina Norfolk M.D.   On: 09/23/2019 17:48   DG Foot Complete Right  Result Date: 09/23/2019 CLINICAL DATA:  Bilateral feet wound issues EXAM: RIGHT FOOT COMPLETE - 3+ VIEW COMPARISON:  None. FINDINGS: There is diffuse osteopenia which somewhat limits evaluation. The patient is status post trans phalangeal amputation of the distal first digit. The amputation site appears to be intact. There appears to be cystic lucency seen at the fifth metatarsal head, which could represent degenerative changes. Periosteal reaction seen in the second through fourth metatarsal shafts. Diffuse dorsal soft tissue swelling is seen. Arthropathy seen at the midfoot with joint space loss and osteophyte formation. Dense vascular calcifications are seen. IMPRESSION: Status post amputation of the first distal toe. No acute fracture seen. Somewhat limited evaluation, however no definite evidence of acute osteomyelitis. Electronically Signed   By: Prudencio Pair M.D.   On: 09/23/2019 17:48    Procedures .Critical Care Performed by: Delia Heady, PA-C Authorized by: Delia Heady, PA-C   Critical care provider statement:    Critical care time (minutes):  35   Critical care was necessary to treat or prevent imminent or life-threatening deterioration of the following conditions:  Cardiac failure, circulatory failure, sepsis and CNS failure or compromise   Critical care was time spent personally by me on the following activities:  Development of treatment plan with patient or surrogate, discussions with consultants, evaluation of patient's response to treatment, examination of  patient, obtaining history from patient or surrogate, ordering and performing treatments and  interventions, ordering and review of laboratory studies, ordering and review of radiographic studies, re-evaluation of patient's condition, pulse oximetry and review of old charts   I assumed direction of critical care for this patient from another provider in my specialty: no     (including critical care time)  Medications Ordered in ED Medications  piperacillin-tazobactam (ZOSYN) IVPB 3.375 g (0 g Intravenous Stopped 09/23/19 1850)  sodium chloride 0.9 % bolus 1,000 mL (0 mLs Intravenous Stopped 09/23/19 1850)  fentaNYL (SUBLIMAZE) injection 25 mcg (25 mcg Intravenous Given 09/23/19 1756)    ED Course  I have reviewed the triage vital signs and the nursing notes.  Pertinent labs & imaging results that were available during my care of the patient were reviewed by me and considered in my medical decision making (see chart for details).    MDM Rules/Calculators/A&P                      74 year old female with medical history of hypertension, diabetes, peripheral vascular disease presenting to the ED with a chief complaint of wound infection.  She was sent over from the wound care center due to severity of her wounds as well as difficulty finding a pulse in bilateral feet.  Patient is afebrile here, she is not hypotensive.  Physical exam findings noted above with multiple chronic wounds that do appear to be actively draining.  I was unable to palpate a DP pulse but was able to find bilaterally with Dopplers.  He is able to move her toes but has pain throughout her feet.  Sensation intact to light touch.  Work-up here significant for leukocytosis of 12, lactic acid slightly elevated at 2.1.  Imaging shows calcaneal fracture which I feel is most likely from her constant ambulation and her wounds.  Attempted to obtain an ABI however the vascular tech states that she had one done about 1 year ago that showed not compressible structures as well as 1 2 years ago which was similar.  She also had an aortogram  that showed just small vessel disease without any interventions needed.  I spoke to Dr. Lyla Glassing of orthopedics who recommends consulting vascular and potential transfer to [facility].  If they need to consult orthopedics, Dr. Sharol Given.  I spoke to Dr. Gwenlyn Saran of vascular surgery who will evaluate patient in consult and asked that we transfer to [facility].  I will attempt to admit to her family medicine service as she is a family practice clinic patient.  Final Clinical Impression(s) / ED Diagnoses Final diagnoses:  Wound infection  Peripheral vascular disease ([place])    Rx / DC Orders ED Discharge Orders    None      Portions of this note were generated with Dragon dictation software. Dictation errors may occur despite best attempts at proofreading.    Delia Heady, PA-C 09/23/19 2040    Drenda Freeze, MD 09/24/19 [phone]

## 2019-09-23 NOTE — ED Notes (Signed)
Attempted to call report to [facility] 4E, no answer

## 2019-09-23 NOTE — ED Triage Notes (Signed)
Pt brought over from wound center for no pulses in bilat feet and vascular issues. Pt also c.o right upper thigh pains esp with movements.

## 2019-09-23 NOTE — Progress Notes (Signed)
Acknowledging that we received a page about potentially transferring this patient to [facility].  Per report from the emergency department her leg wounds require vascular surgery who does not normally staff at [facility] long.  We are told that vascular has agreed to do an initial assessment at [facility] long but then the patient would need to be transferred here for care.  One of the family medicine residency attendings is PCP for this patient so we are willing to assume care once she arrives to [facility].  For safety reasons, we cannot assume care for this patient until we know that she is transported because we cannot perform assessments and manage her appropriately from a distance.  We are not currently the primary attending for this patient but are willing to assume care once her transport has delivered her to [facility].  FAMILY MEDICINE TEACHING SERVICE Patient - Please contact intern pager [phone] or check [facility] residency on via website [email or web address] (login: [email or web address]) for questions regarding care. Text pages welcome. DO NOT page listed attending provider unless there is no answer from the number above.   Dr. Criss Rosales

## 2019-09-23 NOTE — ED Notes (Signed)
[facility] called for transport. 

## 2019-09-23 NOTE — ED Notes (Addendum)
Date and time results received: 09/23/19 1946 (use smartphrase ".now" to insert current time)  Test: lactic acid Critical Value: 2.1  Name of Provider Notified: yao md  Orders Received? Or Actions Taken?: waiting on orders

## 2019-09-23 NOTE — H&P (Addendum)
[facility] Admission History and Physical Service Pager: [phone]  Patient name: [name] Medical record number: [id number] Date of birth: [birth year] Age: 74 y.o. Gender: female  Primary Care Provider: [name], MD Consultants: Vascular surgery, orthosurgery Code Status: Full Preferred Emergency Contact: Minerva Fester - Daughter - [phone]  Chief Complaint: Wound infection/bilateral lower extremity ulceration  Assessment and Plan: [name] is a 74 y.o. female presenting with concern for wound infection with bilateral lower extremity ulcerations secondary to PAD. PMH is significant for PAD, hypothyroid, hypertension, hyperlipidemia, diabetes, HFpEF, rheumatoid arthritis  Wound infection/bilateral lower extremity ulceration secondary to PAD In the emergency department labs were drawn which showed BUN of 33, creatinine 1.47 (baseline appears to be 1.10-1.40), albumin reduced at 2.5, total protein elevated at 9.0.  Patient had a lactic acid initially 1.6 which increased to 2.1 as well as white blood cell count slightly elevated at 12.3.  Patient had an initial mild anemia of 11.1, this was 12.9 on recheck.  Chest x-ray in the ED showed moderate severity infiltrate within the mid and lower left lung with increased fibrotic changes in the left lung base as well as a single dilated small bowel loop in the left upper quadrant.  Left foot x-ray showing chronic and degenerative changes without evidence of acute osseous abnormality and area of soft tissue ulceration along the plantar aspect of the proximal left foot.  Right foot x-ray showing status post amputation of the first digit toe with no acute fractures and no definitive evidence of acute osteomyelitis.  Left tibia/fibula x-ray showing diffuse soft tissue swelling without evidence of acute osseous abnormality.  Right tibia/fibula x-ray showing commuted fracture deformity involving the right  calcaneus, right knee osteoarthritis and osteopenia.  Vascular surgery was consulted and patient was started on Zosyn. -Admit to [facility], attending Dr. Andria Frames -Vascular surgery on board, appreciate recommendation -Per vascular's note patient likely needs bilateral above-knee amputation  -Patient n.p.o. at midnight -Follow blood cultures -Palliative consult -IV fluid normal saline at 75 cc/hr while patient is n.p.o. -PT/OT -SCDs for PPx -Continue Zosyn -Tylenol 650 every 6 hours scheduled.  Can consider oxycodone 5 mg if needed for pain. -Vitals per floor  Right calcaneal fracture Patient with reported significant pain in the right heel while walking.  X-ray showing a comminuted fracture involving the right calcaneus. -Pain relief as above  Right hip pain Patient states she fell few days ago and since then has had significant right hip pain.  On physical exam patient jumps when the lateral aspect of her hip joint is palpated. -Hip x-ray -Pain relief as above  Hypothyroidism  Last TSH 07/19/2019 showing 7.360. Home medications of Synthroid was increased from 125 MCG to 150 mcg at that time.  Home medications currently include Synthroid 150 MCG. -Continue home medication  Hypertension Blood pressure since admission with systolic range [id number].  Most recent blood pressure 123/65.  Home medications include metolazone 5 mg twice daily. -Continue home medication  Hyperlipidemia Home medication include Crestor 20 mg daily.  Most recent cholesterol in November 2020 showed total cholesterol 101, HDL 47, LDL 42. -Continue home statin  Lower extremity edema/HFpEF Most recent echo 04/03/2018 showed ejection fraction 60-65% with grade 1 diastolic dysfunction.  Home medications include Lasix 160 mg twice daily,  metolazone 5 mg twice weekly. -Continue home medications  Weight loss Patient with 35 pound weight loss in the past 2.5 months.  December 2 patient weighing 140 pounds at wound care  clinic, today patient weighing 115 pounds.  Possible etiology for weight loss can include nutritional deficiencies, lack of hunger drive, inaccurate scale measurements during one recording or the other, malignancy, malabsorption. -Palliative consult as above -Nutrition consult -Recommend outpatient follow-up for etiology weight loss  Type 2 diabetes Patient not taking any home medications.  Last A1c 7.5 on 11/2017.  Blood glucose in the emergency department of 109. -Continue to monitor  History of dyspnea - ?COPD vs asthma Home medications include albuterol inhaler as needed.  Per chart review it appears the patient mostly has difficulty when it is hot outside and uses albuterol inhaler approximately 1-2 times per month for this.  Patient does not have a diagnosed history of asthma or COPD per chart review.  Non-smoker. -Continue as needed albuterol  Rheumatoid arthritis Per chart review patient previously followed by rheumatology and re-referred to rheumatology in October of last year.  Patient had virtual visit with rheumatology on 07/26/2019 at which time orders were placed for rheumatologic labs.  Per that note it seems the patient was previously on methotrexate but stopped in 2013.  Patient currently on no home medications. -Continue to monitor  FEN/GI: N.p.o. Prophylaxis: SCDs  Disposition: Admit to MedSurg  History of Present Illness:  [name] is a 74 y.o. female presenting with likely wound infection with bilateral lower extremity ulceration. Patient states she has had her chronic wounds in her feet for months.  Patient was apparently at wound care earlier and was recommended to go to the emergency department due to the state of her wounds.    She states that she did have a fall with right-sided injury last week and since then her hip has bothered her with pain in the area of the joint.  Patient initially seen in emergency department at [facility].  At that time she has had  chest x-ray which showed moderate severity infiltrate with mid and lower left lung.  Patient also had lower extremity x-rays which showed right-sided calcaneal fracture.Left foot x-ray showing chronic and degenerative changes without evidence of acute osseous abnormality and area of soft tissue ulceration along the plantar aspect of the proximal left foot.  Right foot x-ray showing status post amputation of the first digit toe with no acute fractures and no definitive evidence of acute osteomyelitis.  Vascular surgery was consulted.  Patient transferred to [facility] on recommendation of vascular surgery for potential bilateral above-knee amputation.  Upon further speaking with the patient she is sad at hearing that she picked she is sad at hearing that she needs amputation and is hopeful there might be some way to save her legs.   Gets iron shots regularly   No tobacco/alcohol/illicit drugs  Would like daughter to be primary decision maker if needed, full code  Review Of Systems: Per HPI with the following additions   Review of Systems  Constitutional: Positive for weight loss.  HENT: Negative for sore throat.   Eyes:       No vision changes in past few days, (had cataract surgery 2 weeks ago)  Respiratory: Negative for shortness of breath.   Cardiovascular: Negative for chest pain.  Gastrointestinal: Negative for abdominal pain and diarrhea.  Genitourinary: Negative for dysuria.  Musculoskeletal: Positive for falls and joint pain (right hip).  Neurological: Negative for headaches.    Patient Active Problem List   Diagnosis Date Noted  . Osteomyelitis ([place]) 09/23/2019  . Left breast mass 06/02/2019  . Stress 06/02/2019  . PAD (peripheral  artery disease) ([place]) 07/22/2018  . Wound of lower extremity, right, initial encounter 06/24/2018  . Dyspnea 03/23/2018  . Impaired functional mobility, balance, gait, and endurance 03/02/2018    Class: Diagnosis of  . Abnormality of gait  02/26/2018  . Iron deficiency anemia 03/19/2017  . Vision disturbance 03/20/2015  . Memory difficulty 03/20/2015  . At high risk for falls 11/10/2012  . DIABETIC  RETINOPATHY 03/17/2007  . DIABETIC PERIPHERAL NEUROPATHY 03/17/2007  . Hypothyroidism 10/16/2006  . Type 2 diabetes mellitus ([place]) 10/16/2006  . OBESITY, NOS 10/16/2006  . HYPERTENSION, BENIGN SYSTEMIC 10/16/2006  . GASTROESOPHAGEAL REFLUX, NO ESOPHAGITIS 10/16/2006  . Rheumatoid arthritis ([place]) 10/16/2006  . OSTEOPENIA 10/16/2006  . INCONTINENCE, URGE 10/16/2006    Past Medical History: Past Medical History:  Diagnosis Date  . Bilateral lower extremity edema 06/06/2014  . Cataracts, bilateral 02/13/2011   Seen on eye exam 02/07/11. F/u in 12 months   . Diabetes mellitus age 53  . Diabetic ulcer of toe ([place]) 12/12/2015  . Hyperlipidemia   . Hypertension   . Retinal detachment, old, partial    left  . Retinopathy due to secondary diabetes mellitus ([place])    L>R, laser 3/07  . Schizotypal personality disorder ([place])   . Thyroid disease   . Weight loss 01/22/2012    Past Surgical History: Past Surgical History:  Procedure Laterality Date  . ABDOMINAL AORTOGRAM W/LOWER EXTREMITY N/A 07/23/2018   Procedure: ABDOMINAL AORTOGRAM W/LOWER EXTREMITY;  Surgeon: Marty Heck, MD;  Location: [facility];  Service: Cardiovascular;  Laterality: N/A;  . BREAST EXCISIONAL BIOPSY Bilateral   . BREAST SURGERY    . CATARACT EXTRACTION     left   . ROTATOR CUFF REPAIR  1990's   left  . Thyroid radiation ablation     for Graves Disease  . TRANSTHORACIC ECHOCARDIOGRAM      EF55-65%, nml - 12/14/2004    Social History: Social History   Tobacco Use  . Smoking status: Never Smoker  . Smokeless tobacco: Never Used  Substance Use Topics  . Alcohol use: No  . Drug use: No   Additional social history: None Please also refer to relevant sections of EMR.  Family History: Family History  Problem Relation Age of  Onset  . Heart disease Mother   . Hypertension Mother   . Heart attack Mother   . Colon cancer Brother   . Colon cancer Maternal Grandmother   . Pulmonary fibrosis Father   . Diabetes Daughter   . Liver disease Sister        transplant, liver  . Liver cancer Brother   . Breast cancer Niece    Allergies and Medications: Allergies  Allergen Reactions  . Feraheme [Ferumoxytol] Itching  . Rosiglitazone Maleate Other (See Comments)    REACTION: Difficulty walking, Fatigue, shortness of breath   No current facility-administered medications on file prior to encounter.   Current Outpatient Medications on File Prior to Encounter  Medication Sig Dispense Refill  . ferrous sulfate 325 (65 FE) MG tablet Take 1 tablet (325 mg total) by mouth every other day. 45 tablet 3  . furosemide (LASIX) 80 MG tablet Take 160 mg by mouth 2 (two) times a day.     . levothyroxine (SYNTHROID) 150 MCG tablet Take 1 tablet (150 mcg total) by mouth daily. 90 tablet 0  . metolazone (ZAROXOLYN) 5 MG tablet Take 5 mg by mouth 2 (two) times a week.    Marland Kitchen PROAIR HFA 108 (90  Base) MCG/ACT inhaler INHALE 2 PUFFS INTO THE LUNGS EVERY 6 HOURS AS NEEDED FOR WHEEZING OR SHORTNESS OF BREATH 8.5 Inhaler 0  . rosuvastatin (CRESTOR) 20 MG tablet Take 1 tablet (20 mg total) by mouth daily. 90 tablet 3  . Blood Glucose Monitoring Suppl (ONETOUCH VERIO) w/Device KIT Check sugar 3 times per day 1 kit 0  . glucose blood (ONETOUCH VERIO) test strip Check blood sugar daily 100 each 3  . Lancet Devices (MICROLET NEXT LANCING DEVICE) MISC 1 each by Does not apply route 3 (three) times daily. Check blood sugar 3 times daily 1 each 0  . ONETOUCH DELICA LANCETS 41J MISC Check blood sugar once daily 100 each 3    Objective: BP 123/65   Pulse 94   Temp 98.4 F (36.9 C) (Oral)   Resp 17   SpO2 96%  Exam: General: Alert and oriented, no apparent distress  ENTM: No pharyengeal erythema Cardiovascular: RRR with no murmurs  noted Respiratory: Left upper lobe with some crackles, right lung sounds clear  Gastrointestinal: Bowel sounds present. No abdominal pain MSK/Derm: Right hip with exquisite tenderness to palpation in the joint.  Bilateral edema of both lower extremities with multiple significant ulceration of both feet including heels.  Right great toe with necrotic changes.  Neuro: Alert and oriented to person, place, time. Psych: Behavior and speech appropriate to situation  Labs and Imaging: CBC BMET  Recent Labs  Lab 09/23/19 1548 09/23/19 1548 09/23/19 1820  WBC 12.3*  --   --   HGB 11.1*   < > 12.9  HCT 38.4   < > 38.0  PLT 344  --   --    < > = values in this interval not displayed.   Recent Labs  Lab 09/23/19 1548 09/23/19 1548 09/23/19 1820  NA 137   < > 133*  K 4.8   < > 5.7*  CL 101   < > 104  CO2 26  --   --   BUN 33*   < > 42*  CREATININE 1.47*   < > 1.40*  GLUCOSE 109*   < > 95  CALCIUM 8.8*  --   --    < > = values in this interval not displayed.      Lurline Del, DO 09/23/2019, 9:59 PM PGY-1, [place] Intern pager: [phone], text pages welcome   FPTS Upper-Level Resident Addendum   I have independently interviewed and examined the patient. I have discussed the above with the original author and agree with their documentation. My edits for correction/addition/clarification are in blue. Please see also any attending notes.    Sherene Sires, DO PGY-3, [facility] 09/24/2019 1:13 AM  FPTS Service pager: [phone] (text pages welcome through [facility])

## 2019-09-23 NOTE — Consult Note (Signed)
Hospital Consult    Reason for Consult:  Bilateral leg wounds Referring Physician:  Delia Heady, [place]  MRN #:  [id number]  History of Present Illness: This is a 74 y.o. female with history or leg wounds. She has previously undergone angiogram of her right lower extremity which demonstrated only small vessel disease no intervention was undertaken. She cannot give me adequate history of the bilateral lower extremity wounds but last visit to the wound center was in December she was later seen in our office referred back. She has had for the wounds are today for evaluation of progressive bilateral heel wounds as well as concern for cellulitis and toe ulceration with significant toe ulceration of the right great toe as well as the first and second toes on the left. She states that she has significant pain particularly in the right heel with walking and also she has pain in both legs with her restless legs. She has not had fever. She does not take blood thinners.  Past Medical History:  Diagnosis Date  . Bilateral lower extremity edema 06/06/2014  . Cataracts, bilateral 02/13/2011   Seen on eye exam 02/07/11. F/u in 12 months   . Diabetes mellitus age 38  . Diabetic ulcer of toe ([place]) 12/12/2015  . Hyperlipidemia   . Hypertension   . Retinal detachment, old, partial    left  . Retinopathy due to secondary diabetes mellitus ([place])    L>R, laser 3/07  . Schizotypal personality disorder ([place])   . Thyroid disease   . Weight loss 01/22/2012    Past Surgical History:  Procedure Laterality Date  . ABDOMINAL AORTOGRAM W/LOWER EXTREMITY N/A 07/23/2018   Procedure: ABDOMINAL AORTOGRAM W/LOWER EXTREMITY;  Surgeon: Marty Heck, MD;  Location: [facility];  Service: Cardiovascular;  Laterality: N/A;  . BREAST EXCISIONAL BIOPSY Bilateral   . BREAST SURGERY    . CATARACT EXTRACTION     left   . ROTATOR CUFF REPAIR  1990's   left  . Thyroid radiation ablation     for Graves Disease  .  TRANSTHORACIC ECHOCARDIOGRAM      EF55-65%, nml - 12/14/2004    Allergies  Allergen Reactions  . Feraheme [Ferumoxytol] Itching  . Rosiglitazone Maleate Other (See Comments)    REACTION: Difficulty walking, Fatigue, shortness of breath    Prior to Admission medications   Medication Sig Start Date End Date Taking? Authorizing Provider  ferrous sulfate 325 (65 FE) MG tablet Take 1 tablet (325 mg total) by mouth every other day. 08/31/18  Yes [name], MD  furosemide (LASIX) 80 MG tablet Take 160 mg by mouth 2 (two) times a day.    Yes [provider]  levothyroxine (SYNTHROID) 150 MCG tablet Take 1 tablet (150 mcg total) by mouth daily. 07/23/19  Yes [name], MD  metolazone (ZAROXOLYN) 5 MG tablet Take 5 mg by mouth 2 (two) times a week. 07/16/19  Yes [provider]  PROAIR HFA 108 (90 Base) MCG/ACT inhaler INHALE 2 PUFFS INTO THE LUNGS EVERY 6 HOURS AS NEEDED FOR WHEEZING OR SHORTNESS OF BREATH 08/31/18  Yes [name], MD  rosuvastatin (CRESTOR) 20 MG tablet Take 1 tablet (20 mg total) by mouth daily. 03/16/19  Yes [name], MD  Blood Glucose Monitoring Suppl [facility] VERIO) w/Device KIT Check sugar 3 times per day 03/16/19   [name], MD  glucose blood [facility] VERIO) test strip Check blood sugar daily 01/02/18   [name] [place]  J, MD  Lancet Devices (MICROLET NEXT LANCING DEVICE) MISC 1 each by Does not apply route 3 (three) times daily. Check blood sugar 3 times daily 12/05/16   Virginia Crews, MD  [facility] DELICA LANCETS 77O MISC Check blood sugar once daily 08/31/18   [name], MD    Social History   Socioeconomic History  . Marital status: Widowed    Spouse name: Not on file  . Number of children: 3  . Years of education: 68  . Highest education level: Not on file  Occupational History  . Occupation: RetiredSoftware engineer   Tobacco Use  . Smoking status: Never Smoker  . Smokeless  tobacco: Never Used  Substance and Sexual Activity  . Alcohol use: No  . Drug use: No  . Sexual activity: Not Currently  Other Topics Concern  . Not on file  Social History Narrative   3 daughters.  Takes care of 8 grandchildren at home.  Lots of stress.  no tobacco, no etoh.  Father is Cristy Hilts- deceased Oct 31, 2003. Sister - Casimer Leek.         Health Care POA:    Emergency Contact: daughter, Janett Billow (c) [phone]   End of Life Plan:    Who lives with you: self   Any pets: none   Diet: Pt has a varied diet of protein starch and vegetables. Pt reports eating a lot of potatoes and starches.  Does not follow diabetic diet.    Exercise: Pt does exercises 2x a week at [employer].    Seatbelts: Pt reports wearing seatbelt when in vehicles.    Sun Exposure/Protection: Pt reports not using sun protection.   Hobbies:  Attends Estate manager/land agent through [employer] October 30, 2022 through Friday 9-5pm.  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      Current Social History  03/06/2017   Who lives at home: Lives alone in one level home; grandson stays occasionally 03/06/2017    Transportation: Bus or taxi currently as car is not working 03/06/2017   Important Relationships & Pets: "Everybody I meet." No pets 03/06/2017    Current Stressors: Transportation, bus line 03/06/2017   Work / Education:  Retired/ 12 th grade 03/06/2017   Religious / Personal Beliefs: "I believe in God, [place], and the resurrection." 03/06/2017   Interests / Fun: crafts, yard work, going to [employer] 03/06/2017   L. [place], RN, BSN                                                                                                          Social Determinants of Health   Financial Resource Strain:   . Difficulty of Paying Living Expenses: Not on file  Food Insecurity:   . Worried About Charity fundraiser in the Last Year: Not on file  . Ran Out of Food in the Last Year: Not on file  Transportation Needs:   . Lack of Transportation  (Medical): Not on file  . Lack of Transportation (Non-Medical): Not on file  Physical Activity:   .  Days of Exercise per Week: Not on file  . Minutes of Exercise per Session: Not on file  Stress:   . Feeling of Stress : Not on file  Social Connections:   . Frequency of Communication with Friends and Family: Not on file  . Frequency of Social Gatherings with Friends and Family: Not on file  . Attends Religious Services: Not on file  . Active Member of Clubs or Organizations: Not on file  . Attends Archivist Meetings: Not on file  . Marital Status: Not on file  Intimate Partner Violence:   . Fear of Current or Ex-Partner: Not on file  . Emotionally Abused: Not on file  . Physically Abused: Not on file  . Sexually Abused: Not on file     Family History  Problem Relation Age of Onset  . Heart disease Mother   . Hypertension Mother   . Heart attack Mother   . Colon cancer Brother   . Colon cancer Maternal Grandmother   . Pulmonary fibrosis Father   . Diabetes Daughter   . Liver disease Sister        transplant, liver  . Liver cancer Brother   . Breast cancer Niece     ROS: Cardiovascular: _0  chest pain/pressure _1  palpitations _2  SOB lying flat _3  DOE _4  pain in legs while walking _5  pain in legs at rest _6  pain in legs at night _7  non-healing ulcers _8  hx of DVT _9  swelling in legs  Pulmonary: _10  productive cough _11  asthma/wheezing _12  home O2  Neurologic: _13  weakness in _14  arms _15  legs _16  numbness in _17  arms _18  legs _19  hx of CVA _20  mini stroke _21 difficulty speaking or slurred speech _22  temporary loss of vision in one eye _23  dizziness  Hematologic: _24  hx of cancer _25  bleeding problems _26  problems with blood clotting easily  Endocrine:   _27  diabetes _28  thyroid disease  GI _29  vomiting blood _30  blood in stool  GU: _31  CKD/renal failure _32  HD--_33  M/W/F or _34  T/T/S _35  burning with urination _36  blood in urine  Psychiatric: _37   anxiety _38  depression  Musculoskeletal: _39  arthritis _40  joint pain  Integumentary: _41  rashes _42  ulcers  Constitutional: _43  fever _44  chills   Physical Examination  Vitals:   09/23/19 1928 09/23/19 2000  BP: (!) 118/56 (!) 113/58  Pulse: 96 96  Resp: 17 16  Temp:    SpO2: 99% 100%   There is no height or weight on file to calculate BMI.  General:  WDWN in NAD Gait: Not observed HENT: WNL, normocephalic Pulmonary: normal non-labored breathing Cardiac: There are palpable femoral pulses bilaterally. Right dorsalis pedis posterior tibial artery with biphasic signal Left DP is palpable with strong posterior tibial signal Abdomen:  soft, NT/ND, no masses Extremities: Wounds are documented in ED provider note are consistent with right heel soft tissue infection with 3 cm area of eschar. Right great toe appears to have exposed bone. Left foot has plantar heel ulceration measuring approximately 2 and half centimeters in the first and second toes have superficial ulceration Neurologic: A&O X 3  CBC    Component Value Date/Time   WBC 12.3 (H) 09/23/2019 1548   RBC 4.27 09/23/2019 1548   HGB 12.9 09/23/2019 1820   HGB WILL FOLLOW 06/02/2019 1343   HCT 38.0 09/23/2019 1820   HCT WILL FOLLOW 06/02/2019 1343   PLT 344 09/23/2019 1548   PLT WILL FOLLOW 06/02/2019 1343   MCV 89.9 09/23/2019 1548   MCV WILL  FOLLOW 06/02/2019 1343   MCH 26.0 09/23/2019 1548   MCHC 28.9 (L) 09/23/2019 1548   RDW 18.3 (H) 09/23/2019 1548   RDW WILL FOLLOW 06/02/2019 1343   LYMPHSABS 1.1 09/23/2019 1548   LYMPHSABS WILL FOLLOW 06/02/2019 1343   MONOABS 0.5 09/23/2019 1548   EOSABS 0.0 09/23/2019 1548   EOSABS WILL FOLLOW 06/02/2019 1343   BASOSABS 0.0 09/23/2019 1548   BASOSABS WILL FOLLOW 06/02/2019 1343    BMET    Component Value Date/Time   NA 133 (L) 09/23/2019 1820   NA 136 06/24/2018 1230   K 5.7 (H) 09/23/2019 1820   CL 104 09/23/2019 1820   CO2 26 09/23/2019 1548   GLUCOSE 95  09/23/2019 1820   BUN 42 (H) 09/23/2019 1820   BUN 34 (H) 06/24/2018 1230   CREATININE 1.40 (H) 09/23/2019 1820   CREATININE 0.82 07/05/2014 1606   CALCIUM 8.8 (L) 09/23/2019 1548   GFRNONAA 35 (L) 09/23/2019 1548   GFRAA 41 (L) 09/23/2019 1548    COAGS: No results found for: INR, PROTIME    Vascular Imaging:   Previous ABI's non compressible  EXAM: RIGHT TIBIA AND FIBULA - 2 VIEW  COMPARISON:  None.  FINDINGS: The bones are diffusely osteopenic. Marked lateral compartment osteoarthritis is identified. Vascular calcifications. No fracture or dislocation identified. Mild diffuse soft tissue swelling. A comminuted fracture deformity involving the right calcaneus is identified. The fracture lines appear distinct suggesting acute fracture.  IMPRESSION: 1. Comminuted fracture deformity involves the right calcaneus. 2. Right knee osteoarthritis. 3. Osteopenia  I reviewed her previous angiography which demonstrated inline flow in the right lower extremity. Aorta and iliac segments were patent bilaterally. Left lower extremity was not evaluated.  ASSESSMENT/PLAN: This is a 74 y.o. female unfortunate presents with advanced ulceration for bilateral lower extremities mostly confined to the feet. I discussed with her that certainly the right lower extremity is nonsalvageable with strong signals at the ankle although I cannot palpate pulses there and reviewing her previous angiography she had no large vessel disease and she does have strong signals by exam today. Either way she has extensive soft tissue necrosis of the heel with exposed bone of the right great toe and also has a calcaneal fracture. She would not be able to heal below-knee amputation given the unhealthy skin there. Regarding the left lower extremity she has a palpable dorsalis pedis pulse also has extensive heel ulceration with extensive pain. In total, I think she would be best served with bilateral above-knee amputations.  She is quite upset by this and this is completely understandable. I will discuss with her further tomorrow. She may also benefit from palliative care discussion prior to consideration of bilateral amputations.  Delisa Finck C. Donzetta Matters, MD [facility] Office: [phone] Pager: [phone]

## 2019-09-23 NOTE — Progress Notes (Addendum)
[name], [name] ([id number]) Visit Report for 09/23/2019 Chief Complaint Document Details Patient Name: Date of Service: [name], [name] 09/23/2019 1:15 PM Medical Record [id number] Patient Account Number: [id number] Date of Birth/Sex: Treating RN: 12-17-1945 (74 y.o. F) Primary Care Provider: Chrisandra Netters Other Clinician: Referring Provider: Treating Provider/Extender:Mayerly Kaman, Nonda Lou, Ivan Anchors in Treatment: 0 Information Obtained from: Patient Chief Complaint Patients presents for treatment of an open diabetic ulcer to the left foot which she's had for about 2 weeks now 09/19/17; patient is here for review of blistered areas on the right leg 2 and on the left leg 1 which have been present for the last week 07/03/2018; patient comes back to clinic for review of wounds on her bilateral lower extremities 09/23/2019; the patient comes back to clinic with large necrotic wounds on her bilateral feet swelling in her legs and marked pain Electronic Signature(s) Signed: 09/23/2019 5:50:02 PM By: Linton Ham MD Entered By: Linton Ham on 09/23/2019 14:33:40 -------------------------------------------------------------------------------- HPI Details Patient Name: Date of Service: [name] 09/23/2019 1:15 PM Medical Record [id number] Patient Account Number: [id number] Date of Birth/Sex: Treating RN: [birth year] (74 y.o. F) Primary Care Provider: Chrisandra Netters Other Clinician: Referring Provider: Treating Provider/Extender:Laelah Siravo, Nonda Lou, Ivan Anchors in Treatment: 0 History of Present Illness HPI Description: 74 year old patient who is known to our practice from May of last year was fully worked up with a venous and arterial duplex study and was referred to the vascular surgeon for follow-up. The patient says she has seen the vascular surgeons and has an appointment back in April. No procedure was done. She has developed a blister on the  left big toe and forefoot which has been fairly large and draining fluid and she self-referred herself to [place]. The patient was recently seen in the ER on 10/06/2016, and was treated with clindamycin and asked to see the podiatrist and PCP. She saw the podiatrist Dr. Felisa Bonier on 10/11/2016 who I understand did an x-ray but no report is available. Of note last year when I saw her, I had referred her to do Dr. Ruta Hinds -- he saw on 02/04/2016. he reviewed and noted that she had evidence of peripheral arterial disease and recommended a follow-up in 6 months for repeat noninvasive arterial exam. Wounds worsened he would consider an angiogram. He saw her back on 03/14/2016, and continued conservative treatment and asked her to come back for noninvasive studies in 6 months. 12/03/2016 -- was seen in the office on 11/28/2016 and note is made of the fact that venous duplex examination on 01/02/2016 showed no evidence of reflux. Did have evidence of peripheral arterial disease and was asked to follow- up in 6 months time. Recent data on 11/28/2016 showed right noncompressible with monophasic waveforms left noncompressible with monophasic waveforms. The thought was she has medial calcifications of the arteries and had bilateral toe brachial indices which were normal. Right toe brachial index was 0.74 and the left toe brachial index was 0.80 She would return for a vascular study in 6 months when her ABI would be checked again. 7/6-Patient is back at 1 week visit to the clinic, home health is attending to the wound once a week, we have been using silver alginate, MRI scheduled this week on Thursday for the left foot. We are seeing her for the left foot plantar ulcer and the right great toe ulcer 12/24/2016 -- the left plantar heel had a large callus which came off during her evaluation and there was an open ulcer  at the base ==== Old notes She was recently seen by her PCP for an ulcer of the left toe  which was treated at the urgent care by giving her ciprofloxacin which she has been taking. She has uncontrolled diabetes and A1c was 13.7 done last week. Past medical history significant for poorly controlled diabetes mellitus type 2, obesity, osteopenia, GERD, diabetic retinopathy, diabetic neuropathy, reported arthritis, hypothyroidism, hypertension. No x-rays were done recently. 12/27/2015 -- had an x-ray of the left foot --IMPRESSION:Juxta-articular erosions at the IP joint great toe and diffusely in the MTP joints greatest at first, suggesting an inflammatory arthropathy such as rheumatoid arthritis or gout.While septic arthritis can cause juxta-articular erosions, the multiplicity of joints involved makes this less likely. Significant soft tissue swelling with single tiny questionable focus of soft tissue gas medial to the first metatarsal head. If patient has persistent symptoms or persistent clinical concern for osteomyelitis, recommend MR imaging of the LEFT foot (with and without contrast if renal function permits). 01/03/2016 -- the lower extremity venous duplex reflex evaluation showed no evidence of deep or superficial thrombus or reflux. The arterial study done and showed the resting right ABI is greater than 1.3 and the left ABI was greater than 1.3 indicating arterial medial calcification. The right TBI was less than 0.64 and the left ABI was less than 0.64 both of which are abnormal. except for the left posterior tibial which is triphasic on the flow is biphasic. With the above results due to the fact that she has diabetes mellitus and would recommend she sees the vascular surgeon to see if any further intervention or procedure needs to be planned. ====== READMISSION 09/19/17;this is a 74 year old woman that we've had in our clinic previously for wounds on predominantly her left foot left toe and left heel. She is a diabetic. She has had lower extremity workups for both venous  reflux disease and arterial disease. She has known noncompressible vessels bilaterally however in May 2017 as noted above her TBI was 0.64 and the left TBI was less than 0.64. When she was last in clinicin May 2017 the left heel wound had closed. She was faithfully wearing her juxta light stockings. She tells me 2-3 weeks ago she developed increasing edema in her legs. She was seen by Dr. Valentino Saxon of nephrology who apparently increased her Lasix from 40-80 mg twice a day. The patient has not noticed any improvement in the edema. She does not weigh herself. About a week ago she noted blisters on her legs on the right 2 on the left 1 since then she has not been wearing the juxta light stockings. She has not had any pain no shortness of breath 09/26/17;; the patient has a stable wound as a result of blistering on the left anterior calf and 2 on the right anterior and right medial. There is another denuded blister on the left anterior more superior. This does not have any current fluid I did not remove the skin today. We've been using silver alginate under compression 10/03/17; the patient's right leg is healed. The area on the left anterior leg all wound sites look better. We've been using silver alginate under compression. In questioning the patient apparently has a juxta light stockings however she has not been using these. We asked her to find them lubricate her skin every night and used a juxta light stocking on the right. I would like to see this next time 10/10/17; both the patient's areas on the left anterior leg  2 are closed the area on the look right posterior medial calf remains closed. She is been using a juxta light stocking on the right she has one for the left Readmission [name] is now a 74 year old woman we have had in this clinic at least 3 times before. Most recently she was here in February 2019 for 2 visits with wounds on her bilateral lower extremities because of blisters  these healed fairly quickly and we discharged her in juxta lite stockings. She was previously here in 2018 with a wound on her left foot and in 2017 she was sent to see Dr. Oneida Alar for follow-up of PAD. Indeed she seems to have had follow-up noninvasive vascular studies every 6 months. Her current problems began at the end of October she apparently suffered a fall with a laceration on her right anterior leg. She had this sutured on 06/23/2018. Sometime in the same timeframe she developed blistering over a large area of the left anterior calf, the right great toe. The blisters have spontaneously ruptured and she has large areas of the epithelial loss. She also has non-open blisters on the medial part of the left great toe. There is also an open area on the right second toe. I do not believe that she was using her compression stockings [not juxta lites] because the swelling in her legs had gotten too large for her to wrap. She tells me she is recently been to her nephrologist and had her Lasix increased which is helped somewhat with the swelling. The patient did not have arterial studies done in our clinic. Her most recent noninvasive studies were in August. These showed ABIs noncompressible bilaterally. She had TBI's on the right at 0.54 and on the left at 0.60 biphasic waveforms at the PTA and DP bilaterally. She was felt to have bilateral ABIs that were unchanged from her previous study in October 2018. However it was noted her TBI's were decreased. Past medical history includes type 2 diabetes with peripheral neuropathy with a recent hemoglobin A1c of 7. She is apparently off her diabetes medications, she has stage III chronic renal failure, hyperlipidemia, hypertension, cataracts, retinopathy, rheumatoid arthritis, iron deficiency and apparently is receiving IV iron, urge incontinence 07/10/2018; she arrives today with everything looking quite a bit better. The large area of blistered  denuded epithelium on the left calf has healed over. The area on the right lateral tibial area is also mostly healed still with a linear open area where her laceration was. The area on her left medial toe is just about healed. She has a dark thick black eschar over the tip of her right first toe. I do not remember this from last week this feels almost like the eschar you would see with ischemia yet her toes are warm and her peripheral pulses are palpable. She does have some degree of PAD but I do not think that was sufficient enough to have caused this. 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 Patient originally came here with a laceration injury of her right lateral mid calf. She also had an area of denuded skin on the left which is since closed over. She had a small area on the tip of her right great toe as well. She arrives today with what looks to be dry gangrene on the right great toe as well as discoloration of the tips of her second and third toes. She is not in any pain. She also had  purulent drainage coming out open area on her left midfoot which was new this week. Culture was obtained The patient is known to vascular surgery having last been seen on 04/02/2018. They follow her for chronic arterial insufficiency. Last arterial studies were in August 2017 at which time her ABIs were noncompressible however her TBI was 0.54 on the right and 0.60 on the left. She was noted to have bilateral ankle arteries remain calcified. Waveforms biphasic. Slight decline in bilateral TBI's 07/24/2018; the patient was graciously seen urgently by vein and vascular with regards to critical limb ischemia, she had a CO2 angiogram and then contrast to look at the lower extremity arteries. Surprisingly no significant vascular disease was noted in the aortic iliac set segment, patent common femoral and profunda arteries patent popliteal and three-vessel runoff with all 3  of the tibials present. Her dorsalis pedis and plantar arch vessels also filled. Notable for the fact that it was felt that she had small vessel disease near her toes but this was not amenable for intervention. The abscess that I unroofed last week on the plantar left foot grew Stenotrophomonas Maltophilia. This is not a usual true wound pathogen. However it had abundant organisms and given the fact that is an abscess I went ahead and treated this with Levaquin 250 daily for 7 days. I had her on doxycycline last week 07/31/2018; I put 3 layer compression on the right leg last week. She arrives in today with almost no edema in the right leg and out of concern for the this in the gangrenous toes I am going to reduce this back to Kerlix and Coban which is what we had her on at the beginning. We are using silver alginate to the traumatic wound on her right anterior leg and this looks a lot better. The area on the left foot is healed and the patient is using her own wraparound compression stocking here. She still has the dry gangrene-like changes predominantly involving the right first toe, tip of her right second and third toes. We are using Betadine here. The exact etiology of this was not completely clear 08/07/18; we've been using 2 layer compression on the right leg. She still has the gangrenous toes for second and third on the right and the original laceration injury. She does not have any significant vascular issues to the level of the ankle. She does have small vessel disease in her toes.The left mid foot wound is closed 08/14/18; the patient arrives in clinic today with improvement in the original area on her right lateral leg. The 3 dry gangrenous toes first second and third on the right all look stable except for the second. This was obviously separating. It was removed fortunately there is healthy-looking surface tissue underneath this. She arrives in clinic with a reopening on the left  midfoot. This was previously a small abscess that had closed. She has Charcot feet bilaterally 08/24/2018; right lateral leg wound is smaller. The 3 dry gangrenous first second and third toes are a bit changed. The third toe was separating I remove the eschar and subcutaneous tissue to reveal a small open area at the tip of the toe just at the base at the head of the nail. The first toe is unchanged. Left plantar foot is larger. This is in the Charcot foot area. I removed nonviable circumference and surface from the wound using silver alginate 08/31/2018; right lateral leg wound continues to contract also the area on her plantar foot. First  great toe is beginning to separate and the nail will soon I think fall off.. She sees vein and vascular tomorrow. 1/20; right lateral leg wound continues to contract and is almost closed first great toe is beginning to separate. The area on her left plantar foot still has rolled edges around the wound and some debris in the surface but generally smaller. We have been using silver alginate to the 2 remaining wound areas and Betadine to the left great toe. The patient saw vein and vascular on 1/14. They did not add anything here. Noted that the patient has microvascular disease but no major macrovascular issues. No intervention was performed during her CO2 aortogram on 07/23/2018. 2/3; 2-week follow-up. Right leg wound which is her original wound and coming here is healed. Second and third right toes have healed. She still has dry eschared/dry gangrene over the right first toe. I thought this might begin to separate but it really is not making great headway with that. The area on the left midfoot reopened 2 to 3 weeks ago and this is made no progress. Finally she has new blisters on the left first toe laterally and the left second toe dorsally over the DIP at the base of the nail 2/17; 2-week follow-up. Right leg wound which is her original wound is healed. Right  second and third toes are still healed. She still has the black ischemic eschar over the first toe which has not really begun to separate. The area on the left midfoot is unfortunately worse with large amounts of denuded skin around this. She has blisters on the medial part of her first toe and the dorsal part of the second toe on the left 2/24; she still has the black ischemic eschar over the first toe which is not really separating. The area on the left midfoot seems stable from last week. The blisters on the medial part of her first toe and dorsal second toe had hardened and I removed both of these there are small open areas underneath. 3/2; left first and second toes are healed. The area on the left plantar foot looks better. Finally the right great toe area is beginning to separate. 3/9; left first and second toes remain healed. Left plantar foot continues to look excellent smaller with a healthy base. Finally the right great toe had separated further. I went ahead and remove the eschar here. There is still a fairly sizable wound here but this will give [place] a better chance to address this. 3/16-Left first and second toes mostly healed although the left first toe appears to have an open area Right great toe deep ulcer with exposed bone, greenish debris with necrotic tissue, right second toe wound with clean edges and base. Patient had silver alginate dressing to all the toes especially on the right. SHe has been offloading with inserts and open toe shoes 3/23; since I last saw this patient 2 weeks ago there is been quite a bit of deterioration. The left plantar foot continues to look about the same. She now has an area on the left lateral first toe. On the right foot the first toe has a fair amount of exposed bone. X-ray that we did last week did not show osteomyelitis nevertheless that would have to be a concern. She also has an area on the tip of the right second toe that is new 3/30; some  improvement in the left plantar foot. Bone I took out of the right first toe last week  did not show osteomyelitis under pathology however culture did grow rare staph aureus and rare Citrobacter. We have been using silver alginate to all her wounds 4/6; left plantar foot wound about the same although this does not look ominous. The right first toe which is been very problematic does not have exposed bone aggressive debridement of the surface of this. She does have grams of epithelialization. We have been using silver alginate on both wounds 4/13; left plantar foot wound about the same. Removed skin and subcutaneous tissue from the wound circumference. The right great toe has underlying osteomyelitis I have extended her Levaquin 500 mg for another 2 weeks. She does not complain of any overt side effects. 4/23;. The patient has wounds on the left plantar foot, left lateral great toe and a new area on the dorsal part of the right second toe. Her large area over the tip of her right great toe. She is on Levaquin I will need to check on when we want to finish this. This is for underlying osteomyelitis of the right great toe 5/4; the patient has or should be completing her Levaquin at this point. This is for osteomyelitis in the right great toe. She also has wounds on her left plantar foot and the dorsal part of her right second toe. We have been using silver alginate 5/11; patient arrives today with her right great toe generally looking better. However the area on the plantar left foot had considerable undermining and discomfort around the wound and some erythema. We have been using silver alginate 5/18-Patient returns at 1 week the right great toe is much more macerated around the edges of the wound, the left foot wounds are considerably better especially the left great toe and left second toe. We have been using silver alginate to all wounds and juxta lite to the right leg and 2 layer compression to  the left. THere is now a fluid filled bleb on the posterior calf of RLE 5/26; right great toe continues to improve. The underlying osteomyelitis was treated empirically in this clinic. Left midfoot wound also looks better than the last time I saw this 2 weeks ago. Apparently last week she had a blister on the right posterior calf that was largely. This was not specifically addressed. It is open since last time she has a large area on the right posterior mid calf with skin attached to a large amount of the circumference. 6/1; we continue to have true improvements in both of the wound areas on the right great toe and left plantar foot. The large blister on the posterior right calf is just about fully epithelialized although there is still some weeping edema in this area 6/8; we continued to have been improvements in both areas currently on the right great toe and left plantar foot. The large blister on the posterior right calf is fully healed. 6/15; we we continue to have improvements in the right great toe. The left plantar foot which was a small wound without undermining last week has decided to expand into the lateral foot. There is some swelling in this area. She is not systemically unwell 6/22; the culture of her left foot that I did last week grew Enterobacter I had her on doxycycline. There was also clearly some purulent drainage coming from this today through the original wound with the swelling on the lateral part of her foot. She is not systemically unwell. 6/25 the patient is on Levaquin for the Enterobacter cultured out of  her left foot. She now has an extensive wound area in the medial left foot we are using silver alginate The patient complains of pain in the foot going up towards the metatarsal head. There is also swelling inferiorly to this area that we identified last week but this is not tender 6/29; the patient is completing her Levaquin for the Enterobacter. We are using  silver alginate to the extensive wound on the left plantar foot as well as to the right great toe 7/14; the right great toe is healed today. The area on the left foot looks better. The MRI of the left foot did not show osteomyelitis in the area of the wound however it did show an effusion in the tibiotalar joint. The patient has rheumatoid arthritis 7/20; right great toe remains healed. The area on the left foot continues to look improved. 7/27 right great toe remains healed. The area on the left foot continues to contract. Healthy granulation tissue she is only offloading this in surgical shoes. 03/24/2019 on evaluation today patient appears to be doing very well with regard to her plantar foot ulcer. She has been tolerating the dressing changes without complication. She has a healthy granulation surface. There is no signs of active infection at this time and her swelling seems to be under good control. 8/17; the areas on the left medial midfoot. Probably subluxed bone in this area. Wounds are superficial. It looks as though there was some blistering skin at one point 8/24; the area on the left medial midfoot. This is completely resolved at this point although it still looks somewhat vulnerable. She probably has subluxed bone underneath this area. She has pes planus deformity 9/14; apparently this wound reopened in the 3 weeks since I saw her. She was seen last week she is given new healing sandals. Been using calcium alginate with felt offloading. This is not surprising given the area looks somewhat vulnerable. 9/28; left plantar foot in the setting of diabetic neuropathy probably some degree of Charcot deformity. She also has abrasions on the left leg which she says are due to the Curlex co-band that was put on by home health. She is also having a lot of pain in the left calf finds the wrap too tight 10/5; left plantar foot has 2 superficial open areas. The abrasions on the left leg from last  week are healed. She still has some swelling and tenderness however. Duplex ultrasound of the leg tomorrow tomorrow. 10/13; patient arrived with a deterioration in the left foot from last week. Undermining wound probably connecting the 2 superficial areas from last week. She wears a surgical shoe with felt offloading. Duplex ultrasound she had last week showed a ruptured Baker's cyst. Her leg looks a lot better 10/20; 2 remanent open areas in the left foot. This looks better than last time. We are using silver alginate. She wears a surgical shoe for offloading there is not a more aggressive option 10/26; the patient does not have any open on the left foot we are using silver alginate to the medial aspect of the foot. She wears a surgical shoe on both feet. Juxta lite stocking on the right, she has 1 for the left. This is been a long haul for this patient. Initially coming in with a wound on her right lateral calf. Then her right great toe. More recently areas on the left medial foot 06/30/2019 on evaluation today this is a patient that I regularly do not see but unfortunately she  has a wound on the plantar aspect of her foot which I have seen before that appeared to likely be healed last week although it reopened before she even got to the end of the week. This is mainly on the plantar aspect of her foot. She is really not been able to find any sandals that Dr. Dellia Nims was talking to her about as far as going forward what she should be wearing. Fortunately there is no evidence of active infection at this time which is good news. No fevers, chills, nausea, vomiting, or diarrhea. 07/07/2019 on evaluation today patient appears to be doing very well in regard to her foot ulcer from the standpoint of the original wound opening. Unfortunately the biggest issue I see right now she does seem to have a sinus tract along with a blister off the medial portion of her foot which is going to be giving her  trouble at this point. We will continue to clean this away in order to allow this to heal appropriately. It does appear that this may actually be open underneath the blister. 07/21/2019 on evaluation today patient actually appears to be completely healed and is doing excellent with regard to her foot ulcer. The patient is very pleased this is doing well. READMISSION 09/23/2019 This is a 74 year old woman we have had in this clinic on 3 separate occasions in the past. Most recently she was here for a protracted period from 07/07/2018 through 07/21/2019 with a wound on her right lateral calf then subsequently her right great toe and then her left medial foot. She was healed out from all of these on 07/21/2019 and was discharged. She became aware late last week that something was hurting in her legs she was unaware that she had any wounds. She called our clinic on Monday to request pain medications. She was told that nothing could be prescribed as she was not currently our patient. She had an appointment roughly 10 days from now but was moved into the day. She arrives in clinic today with plastic bags around her feet. The patient has gross necrotic wounds on the right heel, right medial foot and right great toe. Similarly on the left midfoot left heel and left toes 1 2 and 3. These are weeping especially on the right heel grossly malodorous. The patient is a type II diabetic. She looks as though she has lost weight. Apparently fecal material staining her clothes.. This is a lot different from what we are used to seeing in this meticulous elderly woman. 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 She did have a CO2 angiogram on 07/23/2018 this was not felt to show any significant stenosis. The use contrast to evaluate the tibial spine runoff and she had patent patent  tibial trifurcation with patent anterior peroneal and posterior tibial arteries. She did not have any interventions. Electronic Signature(s) Signed: 09/23/2019 5:50:02 PM By: Linton Ham MD Entered By: Linton Ham on 09/23/2019 14:40:35 -------------------------------------------------------------------------------- Physical Exam Details Patient Name: Date of Service: [name] 09/23/2019 1:15 PM Medical Record [id number] Patient Account Number: [id number] Date of Birth/Sex: Treating RN: 10/06/1945 (74 y.o. F) Primary Care Provider: Other Clinician: Chrisandra Netters Referring Provider: Treating Provider/Extender:Lamona Eimer, Nonda Lou, Ivan Anchors in Treatment: 0 Constitutional Sitting or standing Blood Pressure is within target range for patient.. Pulse regular and within target range for patient.Marland Kitchen Respirations regular, non-labored  and within target range.. Temperature is normal and within the target range for the patient.. The patient has deteriorated. She is looks as though she is lost a lot of weight. Grossly malodorous. Respiratory Above normal respiratory effort noted. Respiratory rate elveaated. Inspiratory crackles coarse. Cardiovascular Heart rhythm and rate regular, without murmur or gallop.. Popliteal pulses are nonpalpable but both femoral pulses are palpable. Pedal pulses absent bilaterally. Feet are cold. Edema present in both extremities.. Integumentary (Hair, Skin) No primary skin issue was seen. Psychiatric Patient is awake conversational. Notes Wound exam Grossly necrotic wounds in the right heel, right medial foot and right great toe. Also necrotic wounds in the left midfoot left heel and toes left 1 2 and 3. These look ischemic, likely infected Electronic Signature(s) Signed: 09/23/2019 5:50:02 PM By: Linton Ham MD Entered By: Linton Ham on 09/23/2019  14:42:40 -------------------------------------------------------------------------------- Physician Orders Details Patient Name: Date of Service: [name] 09/23/2019 1:15 PM Medical Record [id number] Patient Account Number: [id number] Date of Birth/Sex: Treating RN: [birth year] (74 y.o. [name], Tammi Klippel Primary Care Provider: Chrisandra Netters Other Clinician: Referring Provider: Treating Provider/Extender:Renella Steig, Nonda Lou, Ivan Anchors in Treatment: 0 Verbal / Phone Orders: No Diagnosis Coding ICD-10 Coding Code Description E11.42 Type 2 diabetes mellitus with diabetic polyneuropathy L97.518 Non-pressure chronic ulcer of other part of right foot with other specified severity L97.521 Non-pressure chronic ulcer of other part of left foot limited to breakdown of skin L02.612 Cutaneous abscess of left foot S80.812D Abrasion, left lower leg, subsequent encounter Discharge From [facility] Services Discharge from [place] - Patient to go to the Emergency Department related to bilateral feet and leg wounds. Secondary Dressing Kerlix/Rolled Gauze - both feet and legs. ABD pad - both feet and legs. Electronic Signature(s) Signed: 09/23/2019 5:50:02 PM By: Linton Ham MD Signed: 09/23/2019 5:56:18 PM By: Deon Pilling Entered By: Deon Pilling on 09/23/2019 14:28:57 -------------------------------------------------------------------------------- Problem List Details Patient Name: Date of Service: [name] 09/23/2019 1:15 PM Medical Record [id number] Patient Account Number: [id number] Date of Birth/Sex: Treating RN: 1945/08/30 (74 y.o. [name], Meta.Reding Primary Care Provider: Chrisandra Netters Other Clinician: Referring Provider: Treating Provider/Extender:Shaolin Armas, Nonda Lou, Ivan Anchors in Treatment: 0 Active Problems ICD-10 Evaluated Encounter Code Description Active Date Today Diagnosis E11.42 Type 2 diabetes mellitus with diabetic  polyneuropathy 07/03/2018 No Yes L97.518 Non-pressure chronic ulcer of other part of right foot 07/20/2018 No Yes with other specified severity L97.528 Non-pressure chronic ulcer of other part of left foot 09/23/2019 No Yes with other specified severity E11.52 Type 2 diabetes mellitus with diabetic peripheral 09/23/2019 No Yes angiopathy with gangrene Inactive Problems ICD-10 Code Description Active Date Inactive Date L97.511 Non-pressure chronic ulcer of other part of right foot limited to 07/03/2018 07/03/2018 breakdown of skin E11.52 Type 2 diabetes mellitus with diabetic peripheral angiopathy 07/20/2018 07/20/2018 with gangrene Resolved Problems ICD-10 Code Description Active Date Resolved Date S81.811D Laceration without foreign body, right lower leg, subsequent 07/03/2018 07/03/2018 encounter L97.821 Non-pressure chronic ulcer of other part of left lower leg limited 07/03/2018 07/03/2018 to breakdown of skin L97.211 Non-pressure chronic ulcer of right calf limited to breakdown of 01/12/2019 01/12/2019 skin Electronic Signature(s) Signed: 09/23/2019 5:50:02 PM By: Linton Ham MD Entered By: Linton Ham on 09/23/2019 14:33:02 -------------------------------------------------------------------------------- Progress Note Details Patient Name: Date of Service: [name] 09/23/2019 1:15 PM Medical Record [id number] Patient Account Number: [id number] Date of Birth/Sex: Treating RN: 1946-02-21 (74 y.o. F) Primary Care Provider: Chrisandra Netters Other Clinician: Referring Provider: Treating Provider/Extender:Lupita Rosales, Nonda Lou, Ivan Anchors  in Treatment: 0 Subjective Chief Complaint Information obtained from Patient Patients presents for treatment of an open diabetic ulcer to the left foot which she's had for about 2 weeks now 09/19/17; patient is here for review of blistered areas on the right leg o2 and on the left leg o1 which have been present for the last  week 07/03/2018; patient comes back to clinic for review of wounds on her bilateral lower extremities 09/23/2019; the patient comes back to clinic with large necrotic wounds on her bilateral feet swelling in her legs and marked pain History of Present Illness (HPI) 74 year old patient who is known to our practice from May of last year was fully worked up with a venous and arterial duplex study and was referred to the vascular surgeon for follow-up. The patient says she has seen the vascular surgeons and has an appointment back in April. No procedure was done. She has developed a blister on the left big toe and forefoot which has been fairly large and draining fluid and she self-referred herself to [place]. The patient was recently seen in the ER on 10/06/2016, and was treated with clindamycin and asked to see the podiatrist and PCP. She saw the podiatrist Dr. Felisa Bonier on 10/11/2016 who I understand did an x-ray but no report is available. Of note last year when I saw her, I had referred her to do Dr. Ruta Hinds -- he saw on 02/04/2016. he reviewed and noted that she had evidence of peripheral arterial disease and recommended a follow-up in 6 months for repeat noninvasive arterial exam. Wounds worsened he would consider an angiogram. He saw her back on 03/14/2016, and continued conservative treatment and asked her to come back for noninvasive studies in 6 months. 12/03/2016 -- was seen in the office on 11/28/2016 and note is made of the fact that venous duplex examination on 01/02/2016 showed no evidence of reflux. Did have evidence of peripheral arterial disease and was asked to follow- up in 6 months time. Recent data on 11/28/2016 showed right noncompressible with monophasic waveforms left noncompressible with monophasic waveforms. The thought was she has medial calcifications of the arteries and had bilateral toe brachial indices which were normal. Right toe brachial index was 0.74 and the left  toe brachial index was 0.80 She would return for a vascular study in 6 months when her ABI would be checked again. 7/6-Patient is back at 1 week visit to the clinic, home health is attending to the wound once a week, we have been using silver alginate, MRI scheduled this week on Thursday for the left foot. We are seeing her for the left foot plantar ulcer and the right great toe ulcer 12/24/2016 -- the left plantar heel had a large callus which came off during her evaluation and there was an open ulcer at the base ==== Old notes She was recently seen by her PCP for an ulcer of the left toe which was treated at the urgent care by giving her ciprofloxacin which she has been taking. She has uncontrolled diabetes and A1c was 13.7 done last week. Past medical history significant for poorly controlled diabetes mellitus type 2, obesity, osteopenia, GERD, diabetic retinopathy, diabetic neuropathy, reported arthritis, hypothyroidism, hypertension. No x-rays were done recently. 12/27/2015 -- had an x-ray of the left foot --IMPRESSION:Juxta-articular erosions at the IP joint great toe and diffusely in the MTP joints greatest at first, suggesting an inflammatory arthropathy such as rheumatoid arthritis or gout.While septic arthritis can cause juxta-articular erosions, the multiplicity  of joints involved makes this less likely. Significant soft tissue swelling with single tiny questionable focus of soft tissue gas medial to the first metatarsal head. If patient has persistent symptoms or persistent clinical concern for osteomyelitis, recommend MR imaging of the LEFT foot (with and without contrast if renal function permits). 01/03/2016 -- the lower extremity venous duplex reflex evaluation showed no evidence of deep or superficial thrombus or reflux. The arterial study done and showed the resting right ABI is greater than 1.3 and the left ABI was greater than 1.3 indicating arterial medial calcification.  The right TBI was less than 0.64 and the left ABI was less than 0.64 both of which are abnormal. except for the left posterior tibial which is triphasic on the flow is biphasic. With the above results due to the fact that she has diabetes mellitus and would recommend she sees the vascular surgeon to see if any further intervention or procedure needs to be planned. ====== READMISSION 09/19/17;this is a 74 year old woman that we've had in our clinic previously for wounds on predominantly her left foot left toe and left heel. She is a diabetic. She has had lower extremity workups for both venous reflux disease and arterial disease. She has known noncompressible vessels bilaterally however in May 2017 as noted above her TBI was 0.64 and the left TBI was less than 0.64. When she was last in clinicin May 2017 the left heel wound had closed. She was faithfully wearing her juxta light stockings. She tells me 2-3 weeks ago she developed increasing edema in her legs. She was seen by Dr. Valentino Saxon of nephrology who apparently increased her Lasix from 40-80 mg twice a day. The patient has not noticed any improvement in the edema. She does not weigh herself. About a week ago she noted blisters on her legs on the right o2 on the left o1 since then she has not been wearing the juxta light stockings. She has not had any pain no shortness of breath 09/26/17;; the patient has a stable wound as a result of blistering on the left anterior calf and 2 on the right anterior and right medial. There is another denuded blister on the left anterior more superior. This does not have any current fluid I did not remove the skin today. We've been using silver alginate under compression 10/03/17; the patient's right leg is healed. The area on the left anterior leg all wound sites look better. We've been using silver alginate under compression. In questioning the patient apparently has a juxta light stockings however she has not  been using these. We asked her to find them lubricate her skin every night and used a juxta light stocking on the right. I would like to see this next time 10/10/17; both the patient's areas on the left anterior leg o2 are closed the area on the look right posterior medial calf remains closed. She is been using a juxta light stocking on the right she has one for the left Readmission [name] is now a 74 year old woman we have had in this clinic at least 3 times before. Most recently she was here in February 2019 for 2 visits with wounds on her bilateral lower extremities because of blisters these healed fairly quickly and we discharged her in juxta lite stockings. She was previously here in 2018 with a wound on her left foot and in 2017 she was sent to see Dr. Oneida Alar for follow-up of PAD. Indeed she seems to have had follow-up noninvasive  vascular studies every 6 months. Her current problems began at the end of October she apparently suffered a fall with a laceration on her right anterior leg. She had this sutured on 06/23/2018. Sometime in the same timeframe she developed blistering over a large area of the left anterior calf, the right great toe. The blisters have spontaneously ruptured and she has large areas of the epithelial loss. She also has non-open blisters on the medial part of the left great toe. There is also an open area on the right second toe. I do not believe that she was using her compression stockings [not juxta lites] because the swelling in her legs had gotten too large for her to wrap. She tells me she is recently been to her nephrologist and had her Lasix increased which is helped somewhat with the swelling. The patient did not have arterial studies done in our clinic. Her most recent noninvasive studies were in August. These showed ABIs noncompressible bilaterally. She had TBI's on the right at 0.54 and on the left at 0.60 biphasic waveforms at the PTA and DP bilaterally.  She was felt to have bilateral ABIs that were unchanged from her previous study in October 2018. However it was noted her TBI's were decreased. Past medical history includes type 2 diabetes with peripheral neuropathy with a recent hemoglobin A1c of 7. She is apparently off her diabetes medications, she has stage III chronic renal failure, hyperlipidemia, hypertension, cataracts, retinopathy, rheumatoid arthritis, iron deficiency and apparently is receiving IV iron, urge incontinence 07/10/2018; she arrives today with everything looking quite a bit better. The large area of blistered denuded epithelium on the left calf has healed over. The area on the right lateral tibial area is also mostly healed still with a linear open area where her laceration was. The area on her left medial toe is just about healed. She has a dark thick black eschar over the tip of her right first toe. I do not remember this from last week this feels almost like the eschar you would see with ischemia yet her toes are warm and her peripheral pulses are palpable. She does have some degree of PAD but I do not think that was sufficient enough to have caused this. 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 Patient originally came here with a laceration injury of her right lateral mid calf. She also had an area of denuded skin on the left which is since closed over. She had a small area on the tip of her right great toe as well. She arrives today with what looks to be dry gangrene on the right great toe as well as discoloration of the tips of her second and third toes. She is not in any pain. She also had purulent drainage coming out open area on her left midfoot which was new this week. Culture was obtained The patient is known to vascular surgery having last been seen on 04/02/2018. They follow her for chronic arterial insufficiency. Last arterial studies were in August 2017 at which time  her ABIs were noncompressible however her TBI was 0.54 on the right and 0.60 on the left. She was noted to have bilateral ankle arteries remain calcified. Waveforms biphasic. Slight decline in bilateral TBI's 07/24/2018; the patient was graciously seen urgently by vein and vascular with regards to critical limb ischemia, she had a CO2 angiogram and then contrast to look at the lower extremity arteries. Surprisingly no significant vascular  disease was noted in the aortic iliac set segment, patent common femoral and profunda arteries patent popliteal and three-vessel runoff with all 3 of the tibials present. Her dorsalis pedis and plantar arch vessels also filled. Notable for the fact that it was felt that she had small vessel disease near her toes but this was not amenable for intervention. The abscess that I unroofed last week on the plantar left foot grew Stenotrophomonas Maltophilia. This is not a usual true wound pathogen. However it had abundant organisms and given the fact that is an abscess I went ahead and treated this with Levaquin 250 daily for 7 days. I had her on doxycycline last week 07/31/2018; I put 3 layer compression on the right leg last week. She arrives in today with almost no edema in the right leg and out of concern for the this in the gangrenous toes I am going to reduce this back to Kerlix and Coban which is what we had her on at the beginning. We are using silver alginate to the traumatic wound on her right anterior leg and this looks a lot better. The area on the left foot is healed and the patient is using her own wraparound compression stocking here. She still has the dry gangrene-like changes predominantly involving the right first toe, tip of her right second and third toes. We are using Betadine here. The exact etiology of this was not completely clear 08/07/18; we've been using 2 layer compression on the right leg. She still has the gangrenous toes for second  and third on the right and the original laceration injury. She does not have any significant vascular issues to the level of the ankle. She does have small vessel disease in her toes.The left mid foot wound is closed 08/14/18; the patient arrives in clinic today with improvement in the original area on her right lateral leg. The 3 dry gangrenous toes first second and third on the right all look stable except for the second. This was obviously separating. It was removed fortunately there is healthy-looking surface tissue underneath this. ooShe arrives in clinic with a reopening on the left midfoot. This was previously a small abscess that had closed. She has Charcot feet bilaterally 08/24/2018; right lateral leg wound is smaller. The 3 dry gangrenous first second and third toes are a bit changed. The third toe was separating I remove the eschar and subcutaneous tissue to reveal a small open area at the tip of the toe just at the base at the head of the nail. The first toe is unchanged. ooLeft plantar foot is larger. This is in the Charcot foot area. I removed nonviable circumference and surface from the wound using silver alginate 08/31/2018; right lateral leg wound continues to contract also the area on her plantar foot. First great toe is beginning to separate and the nail will soon I think fall off.. She sees vein and vascular tomorrow. 1/20; right lateral leg wound continues to contract and is almost closed first great toe is beginning to separate. The area on her left plantar foot still has rolled edges around the wound and some debris in the surface but generally smaller. We have been using silver alginate to the 2 remaining wound areas and Betadine to the left great toe. The patient saw vein and vascular on 1/14. They did not add anything here. Noted that the patient has microvascular disease but no major macrovascular issues. No intervention was performed during her CO2 aortogram on  07/23/2018.  2/3; 2-week follow-up. Right leg wound which is her original wound and coming here is healed. Second and third right toes have healed. She still has dry eschared/dry gangrene over the right first toe. I thought this might begin to separate but it really is not making great headway with that. The area on the left midfoot reopened 2 to 3 weeks ago and this is made no progress. Finally she has new blisters on the left first toe laterally and the left second toe dorsally over the DIP at the base of the nail 2/17; 2-week follow-up. Right leg wound which is her original wound is healed. Right second and third toes are still healed. She still has the black ischemic eschar over the first toe which has not really begun to separate. The area on the left midfoot is unfortunately worse with large amounts of denuded skin around this. She has blisters on the medial part of her first toe and the dorsal part of the second toe on the left 2/24; she still has the black ischemic eschar over the first toe which is not really separating. The area on the left midfoot seems stable from last week. The blisters on the medial part of her first toe and dorsal second toe had hardened and I removed both of these there are small open areas underneath. 3/2; left first and second toes are healed. The area on the left plantar foot looks better. Finally the right great toe area is beginning to separate. 3/9; left first and second toes remain healed. Left plantar foot continues to look excellent smaller with a healthy base. Finally the right great toe had separated further. I went ahead and remove the eschar here. There is still a fairly sizable wound here but this will give [place] a better chance to address this. 3/16-Left first and second toes mostly healed although the left first toe appears to have an open area Right great toe deep ulcer with exposed bone, greenish debris with necrotic tissue, right second toe wound  with clean edges and base. Patient had silver alginate dressing to all the toes especially on the right. SHe has been offloading with inserts and open toe shoes 3/23; since I last saw this patient 2 weeks ago there is been quite a bit of deterioration. The left plantar foot continues to look about the same. She now has an area on the left lateral first toe. On the right foot the first toe has a fair amount of exposed bone. X-ray that we did last week did not show osteomyelitis nevertheless that would have to be a concern. She also has an area on the tip of the right second toe that is new 3/30; some improvement in the left plantar foot. Bone I took out of the right first toe last week did not show osteomyelitis under pathology however culture did grow rare staph aureus and rare Citrobacter. We have been using silver alginate to all her wounds 4/6; left plantar foot wound about the same although this does not look ominous. The right first toe which is been very problematic does not have exposed bone aggressive debridement of the surface of this. She does have grams of epithelialization. We have been using silver alginate on both wounds 4/13; left plantar foot wound about the same. Removed skin and subcutaneous tissue from the wound circumference. ooThe right great toe has underlying osteomyelitis I have extended her Levaquin 500 mg for another 2 weeks. She does not complain of any overt side  effects. 4/23;. The patient has wounds on the left plantar foot, left lateral great toe and a new area on the dorsal part of the right second toe. Her large area over the tip of her right great toe. She is on Levaquin I will need to check on when we want to finish this. This is for underlying osteomyelitis of the right great toe 5/4; the patient has or should be completing her Levaquin at this point. This is for osteomyelitis in the right great toe. She also has wounds on her left plantar foot and the  dorsal part of her right second toe. We have been using silver alginate 5/11; patient arrives today with her right great toe generally looking better. However the area on the plantar left foot had considerable undermining and discomfort around the wound and some erythema. We have been using silver alginate 5/18-Patient returns at 1 week the right great toe is much more macerated around the edges of the wound, the left foot wounds are considerably better especially the left great toe and left second toe. We have been using silver alginate to all wounds and juxta lite to the right leg and 2 layer compression to the left. THere is now a fluid filled bleb on the posterior calf of RLE 5/26; right great toe continues to improve. The underlying osteomyelitis was treated empirically in this clinic. ooLeft midfoot wound also looks better than the last time I saw this 2 weeks ago. ooApparently last week she had a blister on the right posterior calf that was largely. This was not specifically addressed. It is open since last time she has a large area on the right posterior mid calf with skin attached to a large amount of the circumference. 6/1; we continue to have true improvements in both of the wound areas on the right great toe and left plantar foot. The large blister on the posterior right calf is just about fully epithelialized although there is still some weeping edema in this area 6/8; we continued to have been improvements in both areas currently on the right great toe and left plantar foot. The large blister on the posterior right calf is fully healed. 6/15; we we continue to have improvements in the right great toe. The left plantar foot which was a small wound without undermining last week has decided to expand into the lateral foot. There is some swelling in this area. She is not systemically unwell 6/22; the culture of her left foot that I did last week grew Enterobacter I had her on  doxycycline. There was also clearly some purulent drainage coming from this today through the original wound with the swelling on the lateral part of her foot. She is not systemically unwell. 6/25 the patient is on Levaquin for the Enterobacter cultured out of her left foot. She now has an extensive wound area in the medial left foot we are using silver alginate The patient complains of pain in the foot going up towards the metatarsal head. There is also swelling inferiorly to this area that we identified last week but this is not tender 6/29; the patient is completing her Levaquin for the Enterobacter. We are using silver alginate to the extensive wound on the left plantar foot as well as to the right great toe 7/14; the right great toe is healed today. The area on the left foot looks better. The MRI of the left foot did not show osteomyelitis in the area of the wound however it  did show an effusion in the tibiotalar joint. The patient has rheumatoid arthritis 7/20; right great toe remains healed. The area on the left foot continues to look improved. 7/27 right great toe remains healed. The area on the left foot continues to contract. Healthy granulation tissue she is only offloading this in surgical shoes. 03/24/2019 on evaluation today patient appears to be doing very well with regard to her plantar foot ulcer. She has been tolerating the dressing changes without complication. She has a healthy granulation surface. There is no signs of active infection at this time and her swelling seems to be under good control. 8/17; the areas on the left medial midfoot. Probably subluxed bone in this area. Wounds are superficial. It looks as though there was some blistering skin at one point 8/24; the area on the left medial midfoot. This is completely resolved at this point although it still looks somewhat vulnerable. She probably has subluxed bone underneath this area. She has pes planus deformity 9/14;  apparently this wound reopened in the 3 weeks since I saw her. She was seen last week she is given new healing sandals. Been using calcium alginate with felt offloading. This is not surprising given the area looks somewhat vulnerable. 9/28; left plantar foot in the setting of diabetic neuropathy probably some degree of Charcot deformity. She also has abrasions on the left leg which she says are due to the Curlex co-band that was put on by home health. She is also having a lot of pain in the left calf finds the wrap too tight 10/5; left plantar foot has 2 superficial open areas. The abrasions on the left leg from last week are healed. She still has some swelling and tenderness however. Duplex ultrasound of the leg tomorrow tomorrow. 10/13; patient arrived with a deterioration in the left foot from last week. Undermining wound probably connecting the 2 superficial areas from last week. She wears a surgical shoe with felt offloading. Duplex ultrasound she had last week showed a ruptured Baker's cyst. Her leg looks a lot better 10/20; 2 remanent open areas in the left foot. This looks better than last time. We are using silver alginate. She wears a surgical shoe for offloading there is not a more aggressive option 10/26; the patient does not have any open on the left foot we are using silver alginate to the medial aspect of the foot. She wears a surgical shoe on both feet. Juxta lite stocking on the right, she has 1 for the left. This is been a long haul for this patient. Initially coming in with a wound on her right lateral calf. Then her right great toe. More recently areas on the left medial foot 06/30/2019 on evaluation today this is a patient that I regularly do not see but unfortunately she has a wound on the plantar aspect of her foot which I have seen before that appeared to likely be healed last week although it reopened before she even got to the end of the week. This is mainly on the  plantar aspect of her foot. She is really not been able to find any sandals that Dr. Dellia Nims was talking to her about as far as going forward what she should be wearing. Fortunately there is no evidence of active infection at this time which is good news. No fevers, chills, nausea, vomiting, or diarrhea. 07/07/2019 on evaluation today patient appears to be doing very well in regard to her foot ulcer from the standpoint of the  original wound opening. Unfortunately the biggest issue I see right now she does seem to have a sinus tract along with a blister off the medial portion of her foot which is going to be giving her trouble at this point. We will continue to clean this away in order to allow this to heal appropriately. It does appear that this may actually be open underneath the blister. 07/21/2019 on evaluation today patient actually appears to be completely healed and is doing excellent with regard to her foot ulcer. The patient is very pleased this is doing well. READMISSION 09/23/2019 This is a 74 year old woman we have had in this clinic on 3 separate occasions in the past. Most recently she was here for a protracted period from 07/07/2018 through 07/21/2019 with a wound on her right lateral calf then subsequently her right great toe and then her left medial foot. She was healed out from all of these on 07/21/2019 and was discharged. She became aware late last week that something was hurting in her legs she was unaware that she had any wounds. She called our clinic on Monday to request pain medications. She was told that nothing could be prescribed as she was not currently our patient. She had an appointment roughly 10 days from now but was moved into the day. She arrives in clinic today with plastic bags around her feet. The patient has gross necrotic wounds on the right heel, right medial foot and right great toe. Similarly on the left midfoot left heel and left toes 1 2 and 3. These are  weeping especially on the right heel grossly malodorous. The patient is a type II diabetic. She looks as though she has lost weight. Apparently fecal material staining her clothes.. This is a lot different from what we are used to seeing in this meticulous elderly woman. 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 She did have a CO2 angiogram on 07/23/2018 this was not felt to show any significant stenosis. The use contrast to evaluate the tibial spine runoff and she had patent patent tibial trifurcation with patent anterior peroneal and posterior tibial arteries. She did not have any interventions. Objective Constitutional Sitting or standing Blood Pressure is within target range for patient.. Pulse regular and within target range for patient.Marland Kitchen Respirations regular, non-labored and within target range.. Temperature is normal and within the target range for the patient.. The patient has deteriorated. She is looks as though she is lost a lot of weight. Grossly malodorous. Vitals Time Taken: 2:00 PM, Height: 62 in, Weight: 140 lbs, BMI: 25.6, Temperature: 96.9 F, Pulse: 79 bpm, Respiratory Rate: 19 breaths/min, Blood Pressure: 142/73 mmHg. General Notes: patient presented to clinic with grocery bags on bilateral feet, right foot noted to have soaked/pooling blood in bag. Patient complains of chills, fatigue, pain, and weakness. Right foot noted to have 6 wounds with necrosis, foul odor, purulent drainage. Left foot noted to have 4 wounds with necrosis, foul odor with purulent drainage. Bilateral legs have circumferential wounds with scattered blisters, scattered necrosis, with edema and redness. Attempted temperature check x3 and was able to get 96.9 axillary. MD to assess and evaluate. Respiratory Above normal respiratory effort noted. Respiratory rate elveaated. Inspiratory  crackles coarse. Cardiovascular Heart rhythm and rate regular, without murmur or gallop.. Popliteal pulses are nonpalpable but both femoral pulses are palpable. Pedal pulses absent bilaterally. Feet  are cold. Edema present in both extremities.Marland Kitchen Psychiatric Patient is awake conversational. General Notes: Wound exam ooGrossly necrotic wounds in the right heel, right medial foot and right great toe. Also necrotic wounds in the left midfoot left heel and toes left 1 2 and 3. These look ischemic, likely infected Integumentary (Hair, Skin) No primary skin issue was seen. Assessment Active Problems ICD-10 Type 2 diabetes mellitus with diabetic polyneuropathy Non-pressure chronic ulcer of other part of right foot with other specified severity Non-pressure chronic ulcer of other part of left foot with other specified severity Type 2 diabetes mellitus with diabetic peripheral angiopathy with gangrene Plan Discharge From [facility] Services: Discharge from [facility] - Patient to go to the Emergency Department related to bilateral feet and leg wounds. Secondary Dressing: Kerlix/Rolled Gauze - both feet and legs. ABD pad - both feet and legs. 1. Grossly necrotic wounds in her right greater than the left feet 2. Clinical exam suggests at least a component of possibly acute ischemia. No doubt there is coexistent infection. 3. The patient looks as though she has deteriorated in the 2 to 3 months since I have seen her. Weight loss, disheveled. 4. I am really not certain that anything can be done to save her lower extremities. She is going to need to see vascular, imaging and broad-spectrum antibiotics. 5. Towards that end I am sending her to the emergency room. This is not something that can be managed as an outpatient. Electronic Signature(s) Signed: 09/23/2019 5:50:02 PM By: Linton Ham MD Entered By: Linton Ham on 09/23/2019  14:44:25 -------------------------------------------------------------------------------- SuperBill Details Patient Name: Date of Service: [name] 09/23/2019 Medical Record [id number] Patient Account Number: [id number] Date of Birth/Sex: Treating RN: 1945/08/21 (74 y.o. F) Primary Care Provider: Chrisandra Netters Other Clinician: Referring Provider: Treating Provider/Extender:Latonya Knight, Nonda Lou, Ivan Anchors in Treatment: 0 Diagnosis Coding ICD-10 Codes Code Description E11.42 Type 2 diabetes mellitus with diabetic polyneuropathy L97.518 Non-pressure chronic ulcer of other part of right foot with other specified severity L97.528 Non-pressure chronic ulcer of other part of left foot with other specified severity E11.52 Type 2 diabetes mellitus with diabetic peripheral angiopathy with gangrene Facility Procedures The patient participates with Medicare or their insurance follows the Medicare Facility Guidelines: CPT4 Code Description Modifier Quantity [id number] [phone] - WOUND CARE VISIT-LEV 5 EST PT 1 Physician Procedures CPT4 Code Description: [id number] - WC PHYS LEVEL 4 - EST PT ICD-10 Diagnosis Description E11.52 Type 2 diabetes mellitus with diabetic peripheral angiop L97.518 Non-pressure chronic ulcer of other part of right foot w L97.528 Non-pressure chronic  ulcer of other part of left foot wi Modifier: athy with gangren ith other specifi th other specifie Quantity: 1 e ed severity d severity Electronic Signature(s) Signed: 09/27/2019 1:35:44 PM By: Maye Hides Signed: 09/27/2019 5:47:38 PM By: Deon Pilling Signed: 09/27/2019 6:01:37 PM By: Linton Ham MD Previous Signature: 09/23/2019 5:50:02 PM Version By: Linton Ham MD Previous Signature: 09/23/2019 5:56:18 PM Version By: Deon Pilling Entered By: Maye Hides on 09/27/2019 13:35:44

## 2019-09-24 ENCOUNTER — Encounter (HOSPITAL_BASED_OUTPATIENT_CLINIC_OR_DEPARTMENT_OTHER): Payer: Medicare Other | Admitting: Internal Medicine

## 2019-09-24 ENCOUNTER — Inpatient Hospital Stay (HOSPITAL_COMMUNITY): Payer: Medicare Other

## 2019-09-24 DIAGNOSIS — L089 Local infection of the skin and subcutaneous tissue, unspecified: Secondary | ICD-10-CM

## 2019-09-24 DIAGNOSIS — I70229 Atherosclerosis of native arteries of extremities with rest pain, unspecified extremity: Secondary | ICD-10-CM

## 2019-09-24 DIAGNOSIS — I70263 Atherosclerosis of native arteries of extremities with gangrene, bilateral legs: Secondary | ICD-10-CM

## 2019-09-24 DIAGNOSIS — I998 Other disorder of circulatory system: Secondary | ICD-10-CM

## 2019-09-24 DIAGNOSIS — E440 Moderate protein-calorie malnutrition: Secondary | ICD-10-CM

## 2019-09-24 LAB — BLOOD CULTURE ID PANEL (REFLEXED)

## 2019-09-24 LAB — BASIC METABOLIC PANEL
Anion gap: 12 (ref 5–15)
BUN: 26 mg/dL — ABNORMAL HIGH (ref 8–23)
CO2: 22 mmol/L (ref 22–32)
Calcium: 8.1 mg/dL — ABNORMAL LOW (ref 8.9–10.3)
Chloride: 103 mmol/L (ref 98–111)
Creatinine, Ser: 1.27 mg/dL — ABNORMAL HIGH (ref 0.44–1.00)
GFR calc Af Amer: 48 mL/min — ABNORMAL LOW (ref >60)
GFR calc non Af Amer: 42 mL/min — ABNORMAL LOW (ref >60)
Glucose, Bld: 79 mg/dL (ref 70–99)
Potassium: 4.4 mmol/L (ref 3.5–5.1)
Sodium: 137 mmol/L (ref 135–145)

## 2019-09-24 LAB — CBC
HCT: 31.9 % — ABNORMAL LOW (ref 36.0–46.0)
Hemoglobin: 9.4 g/dL — ABNORMAL LOW (ref 12.0–15.0)
MCH: 26.5 pg (ref 26.0–34.0)
MCHC: 29.5 g/dL — ABNORMAL LOW (ref 30.0–36.0)
MCV: 89.9 fL (ref 80.0–100.0)
Platelets: 308 10*3/uL (ref 150–400)
RBC: 3.55 MIL/uL — ABNORMAL LOW (ref 3.87–5.11)
RDW: 17.9 % — ABNORMAL HIGH (ref 11.5–15.5)
WBC: 37.7 10*3/uL — ABNORMAL HIGH (ref 4.0–10.5)
nRBC: 0 % (ref 0.0–0.2)

## 2019-09-24 LAB — BRAIN NATRIURETIC PEPTIDE: B Natriuretic Peptide: 701.7 pg/mL — ABNORMAL HIGH (ref 0.0–100.0)

## 2019-09-24 MED ORDER — SODIUM CHLORIDE 0.9 % IV SOLN: INTRAVENOUS | Status: DC

## 2019-09-24 MED ORDER — OCUVITE-LUTEIN PO CAPS
1.0000 | ORAL_CAPSULE | Freq: Every day | ORAL | Status: DC
  Filled 2019-09-24 (×2): qty 1

## 2019-09-24 MED ORDER — HEPARIN SODIUM (PORCINE) 5000 UNIT/ML IJ SOLN
5000.0000 [IU] | Freq: Three times a day (TID) | INTRAMUSCULAR | Status: DC
  Administered 2019-09-24 – 2019-09-28 (×10): 5000 [IU] via SUBCUTANEOUS
  Filled 2019-09-24 (×11): qty 1

## 2019-09-24 MED ORDER — PIPERACILLIN-TAZOBACTAM 3.375 G IVPB
3.3750 g | Freq: Three times a day (TID) | INTRAVENOUS | Status: DC
  Administered 2019-09-24 – 2019-09-26 (×8): 3.375 g via INTRAVENOUS
  Filled 2019-09-24 (×9): qty 50

## 2019-09-24 MED ORDER — ACETAMINOPHEN 325 MG PO TABS
650.0000 mg | ORAL_TABLET | Freq: Four times a day (QID) | ORAL | Status: DC
  Administered 2019-09-24 – 2019-10-02 (×29): 650 mg via ORAL
  Filled 2019-09-24 (×31): qty 2

## 2019-09-24 MED ORDER — PRO-STAT SUGAR FREE PO LIQD
30.0000 mL | Freq: Two times a day (BID) | ORAL | Status: DC
  Administered 2019-09-24 – 2019-09-30 (×10): 30 mL via ORAL
  Filled 2019-09-24 (×9): qty 30

## 2019-09-24 MED ORDER — PROSIGHT PO TABS
1.0000 | ORAL_TABLET | Freq: Every day | ORAL | Status: DC
  Administered 2019-09-24 – 2019-09-30 (×7): 1 via ORAL
  Filled 2019-09-24 (×6): qty 1

## 2019-09-24 NOTE — Progress Notes (Signed)
PHARMACY - PHYSICIAN COMMUNICATION CRITICAL VALUE ALERT - BLOOD CULTURE IDENTIFICATION (BCID)  [name] is an 74 y.o. female who presented to [facility] on 09/23/2019 with a chief complaint of wound infection in bilateral lower extremities  Assessment:  Two sets of blood cultures positive for GNR.  No targets detected on BCID  Name of physician (or Provider) Contacted: FPTS pager notified  Current antibiotics: Zosyn  Changes to prescribed antibiotics recommended: Reasonable to continue with Zosyn for now   Results for orders placed or performed during the hospital encounter of 09/23/19  Blood Culture ID Panel (Reflexed) (Collected: 09/23/2019  4:13 PM)  Result Value Ref Range   Enterococcus species NOT DETECTED NOT DETECTED   Listeria monocytogenes NOT DETECTED NOT DETECTED   Staphylococcus species NOT DETECTED NOT DETECTED   Staphylococcus aureus (BCID) NOT DETECTED NOT DETECTED   Streptococcus species NOT DETECTED NOT DETECTED   Streptococcus agalactiae NOT DETECTED NOT DETECTED   Streptococcus pneumoniae NOT DETECTED NOT DETECTED   Streptococcus pyogenes NOT DETECTED NOT DETECTED   Acinetobacter baumannii NOT DETECTED NOT DETECTED   Enterobacteriaceae species NOT DETECTED NOT DETECTED   Enterobacter cloacae complex NOT DETECTED NOT DETECTED   Escherichia coli NOT DETECTED NOT DETECTED   Klebsiella oxytoca NOT DETECTED NOT DETECTED   Klebsiella pneumoniae NOT DETECTED NOT DETECTED   Proteus species NOT DETECTED NOT DETECTED   Serratia marcescens NOT DETECTED NOT DETECTED   Haemophilus influenzae NOT DETECTED NOT DETECTED   Neisseria meningitidis NOT DETECTED NOT DETECTED   Pseudomonas aeruginosa NOT DETECTED NOT DETECTED   Candida albicans NOT DETECTED NOT DETECTED   Candida glabrata NOT DETECTED NOT DETECTED   Candida krusei NOT DETECTED NOT DETECTED   Candida parapsilosis NOT DETECTED NOT DETECTED   Candida tropicalis NOT DETECTED NOT DETECTED    Candie Mile 09/24/2019  8:58 AM

## 2019-09-24 NOTE — Progress Notes (Signed)
Initial Nutrition Assessment  DOCUMENTATION CODES:   Not applicable  INTERVENTION:  -Liberalize diet -Magic cup TID with meals, each supplement provides 290 kcal and 9 grams of protein -Pro-stat 30 ml po BID, each supplement provides 100 kcal and 15 grams of protein  -Ocuvite-Lutein daily, provides 200 mg vit C, 40 mg zinc, 55 mcg selenium, 2 mg copper, 2 mg lutein to promote wound healing   NUTRITION DIAGNOSIS:   Increased nutrient needs related to wound healing as evidenced by estimated needs.    GOAL:   Patient will meet greater than or equal to 90% of their needs    MONITOR:   PO intake, Weight trends, Supplement acceptance, I & O's, Labs, Skin, Diet advancement  REASON FOR ASSESSMENT:   Consult Other (Comment)(recent wt loss)  ASSESSMENT:  RD working remotely.   74 year old female with past medical history of PAD, hypothyroidism, HTN, HLD, DM, HFpEF, and RA presenting with concerns of wound infection and reports 35 lb unintentional wt loss in the past 2.5 months. Patient admitted with bilateral severe leg ischemia secondary to PAD  Vascular consulted for recommendations. Per notes, right heel is necrotic with underlying calcaneal fx; left foot has necrotic ulcer on the heel. Need for bilateral AKA discussed with patient who is unwilling to proceed at this time.   Unable to obtain nutrition history at this time. Patient in diagnostic radiology per chart review. Diet advanced to Renal/CM/1200 ml this morning. Current diet order restricts protein and limits patient menu options. Recommend regular diet given patient does not have history of ESRD and is not on HD, noted hyperkalemia which is now resolved. Patient with h/o DM, blood glucose < 100 this admission, last A1c 5.9 in October 2020.   Mild pitting BLE edema noted per RN assessment.  Current wt 115.28 lbs, suspect actual weight is less given fluid status. Noted 14.52 lb (11.2%) wt loss in the past 5 months which is  severe for time frame. Given wt losses, history of present illness, and advanced age highly suspect malnutrition, however unable to identify at this time.   Medications reviewed and include: Ferrous sulfate, Zosyn  Labs: BG 79,95 x 24 hrs, BUN 26 (H), Cr 1.27 (H), BNP 701 (H), WBC 37.7 (H), Hgb 9.4 (L)  Lab Results  Component Value Date   HGBA1C 5.9 06/02/2019    NUTRITION - FOCUSED PHYSICAL EXAM: Unable to complete at this time, RD working remotely.    Diet Order:   Diet Order            Diet renal/carb modified with fluid restriction Diet-HS Snack? Nothing; Fluid restriction: 1200 mL Fluid; Room service appropriate? Yes; Fluid consistency: Thin  Diet effective now              EDUCATION NEEDS:   No education needs have been identified at this time  Skin:  Skin Assessment: Skin Integrity Issues: Skin Integrity Issues:: Unstageable, Diabetic Ulcer, Other (Comment) Unstageable: Right; big toe Diabetic Ulcer: Venous stasis ulcer; Right;Left; heel Other: Cellulitis;Weeping;BLE  Last BM:  2/4  Height:   Ht Readings from Last 1 Encounters:  09/24/19 5\' 2"  (1.575 m)    Weight:   Wt Readings from Last 1 Encounters:  09/23/19 52.4 kg    Ideal Body Weight:  50 kg  BMI:  Body mass index is 21.13 kg/m.  Estimated Nutritional Needs:   Kcal:  1600-1800  Protein:  80-90  Fluid:  1.2 L/day per MD   Lajuan Lines, RD, LDN  Clinical Nutrition [facility] Telephone [phone] After Hours/Weekend Pager: [phone]

## 2019-09-24 NOTE — Evaluation (Addendum)
Physical Therapy Evaluation Patient Details Name: [name] MRN: [id number] DOB: Oct 27, 1945 Today's Date: 09/24/2019   History of Present Illness  Patient is a 74 y/o female who presents from wound center with BLE ulceration/infections with a necrotic ulcer on the left heel with foul smelling drainage and the right heel is frankly necrotic with underlying calcaneal fracture with a very foul smell at the tip of the toe appears to have bone exposed. Vascular is recommending bilateral AKAs. PMH includes Schizotypal personality disorder, HTN, HLD, DM, retinol detachment.  Clinical Impression  Patient presents with generalized weakness, pain, open wounds BLEs, impaired cognition and impaired mobility s/p above. Pt not the best historian. Reports she lives alone and son stays with her intermittently, doing all IADLs. Pt reports walking with rollator and caring for self PTA. Today, pt requires Max A for bed mobility, Mod A of 2 for standing and Max A of 2 for SPT. Limited by pain and weakness as well as worsened bleeding in left foot with weight bearing. Encouraged there ex in bed. Not sure pt understands extent of injury regarding BLEs as she is refusing amputations. Pt not safe to be home alone, high fall risk. Would benefit from SNF to maximize independence and mobility prior to return home. Will follow acutely.    Follow Up Recommendations SNF;Supervision for mobility/OOB;Supervision/Assistance - 24 hour    Equipment Recommendations  Wheelchair cushion (measurements PT);Wheelchair (measurements PT)    Recommendations for Other Services       Precautions / Restrictions Precautions Precautions: Fall Restrictions Weight Bearing Restrictions: No Other Position/Activity Restrictions: ulcers all over both feet, exposed bone, open sores      Mobility  Bed Mobility Overal bed mobility: Needs Assistance Bed Mobility: Supine to Sit;Sit to Supine     Supine to sit: Max assist;HOB elevated Sit  to supine: Mod assist   General bed mobility comments: Assist to bring LEs to EOB and with trunk; increased time. sharp pain in right groin area when coming up. assist to bring LEs into bed to return to supine.  Transfers Overall transfer level: Needs assistance Equipment used: 2 person hand held assist Transfers: Sit to/from [facility] Sit to Stand: Mod assist Stand pivot transfers: Max assist       General transfer comment: Assist of 2 to stand from EOB with pt placing little to no weight on RLE, Max A for SPT from bed to/from [facility]. Worsened bleeding on plantar surface of left foot, removed sock and elevated on pillow.  Ambulation/Gait             General Gait Details: Unable  Stairs            Wheelchair Mobility    Modified Rankin (Stroke Patients Only)       Balance Overall balance assessment: Needs assistance Sitting-balance support: Feet unsupported;No upper extremity supported Sitting balance-Leahy Scale: Fair Sitting balance - Comments: supervision for safety.   Standing balance support: During functional activity Standing balance-Leahy Scale: Poor Standing balance comment: Requires external assist for standing.                             Pertinent Vitals/Pain Pain Assessment: Faces Faces Pain Scale: Hurts little more Pain Location: right hip with movement. Pain Descriptors / Indicators: Tender;Sharp Pain Intervention(s): Limited activity within patient's tolerance;Repositioned    Home Living Family/patient expects to be discharged to:: Private residence Living Arrangements: Children(son stays with her sometimes) Available  Help at Discharge: Family;Available PRN/intermittently Type of Home: House Home Access: Stairs to enter   Entrance Stairs-Number of Steps: 1 Home Layout: One level Home Equipment: Walker - 4 wheels;Bedside commode      Prior Function Level of Independence: Needs assistance   Gait / Transfers  Assistance Needed: Uses rollator for ambulation  ADL's / Homemaking Assistance Needed: Does her own ADLs. Does sink baths  Comments: SOn has been cooking/cleaning, going to the grocery store. Pt is not the best historian.     Hand Dominance        Extremity/Trunk Assessment   Upper Extremity Assessment Upper Extremity Assessment: Defer to OT evaluation    Lower Extremity Assessment Lower Extremity Assessment: Generalized weakness;RLE deficits/detail;LLE deficits/detail RLE Deficits / Details: Exposed bone at heel, open ulcers everywhere, dusky darkened color. RLE Sensation: decreased light touch LLE Deficits / Details: Ulcers throughout distal LE and foot, bleeding. LLE Sensation: decreased light touch    Cervical / Trunk Assessment Cervical / Trunk Assessment: Kyphotic  Communication   Communication: No difficulties  Cognition Arousal/Alertness: Awake/alert Behavior During Therapy: WFL for tasks assessed/performed Overall Cognitive Status: Impaired/Different from baseline Area of Impairment: Memory;Following commands;Safety/judgement;Awareness                     Memory: Decreased short-term memory Following Commands: Follows one step commands with increased time Safety/Judgement: Decreased awareness of safety;Decreased awareness of deficits Awareness: Intellectual   General Comments: Pt with poor awareness/understanding of state of LEs. "this all just came in 1 day."      General Comments General comments (skin integrity, edema, etc.): Ulcers and open wounds in distal BLEs and into feet, exposed bone noted. Darkened, dusky like color distal RLE.    Exercises     Assessment/Plan    PT Assessment Patient needs continued PT services  PT Problem List Decreased strength;Decreased mobility;Decreased safety awareness;Pain;Impaired sensation;Decreased balance;Decreased activity tolerance;Decreased cognition;Decreased skin integrity;Decreased range of motion        PT Treatment Interventions Therapeutic activities;Gait training;Therapeutic exercise;Patient/family education;Wheelchair mobility training;Balance training;Functional mobility training;DME instruction    PT Goals (Current goals can be found in the Care Plan section)  Acute Rehab PT Goals Patient Stated Goal: to keep my legs PT Goal Formulation: With patient Time For Goal Achievement: 10/08/19 Potential to Achieve Goals: Fair    Frequency Min 3X/week   Barriers to discharge Decreased caregiver support lives alone with son intermittently available per report?    Co-evaluation               AM-PAC PT "6 Clicks" Mobility  Outcome Measure Help needed turning from your back to your side while in a flat bed without using bedrails?: A Little Help needed moving from lying on your back to sitting on the side of a flat bed without using bedrails?: Total Help needed moving to and from a bed to a chair (including a wheelchair)?: Total Help needed standing up from a chair using your arms (e.g., wheelchair or bedside chair)?: A Lot Help needed to walk in hospital room?: Total Help needed climbing 3-5 steps with a railing? : Total 6 Click Score: 9    End of Session Equipment Utilized During Treatment: Gait belt Activity Tolerance: Patient limited by pain Patient left: in bed;with call bell/phone within reach;with bed alarm set Nurse Communication: Mobility status;Other (comment)(tech notified of recommendation not to use BSC at this time and need for purewick) PT Visit Diagnosis: Pain;Difficulty in walking, not elsewhere classified (R26.2);Muscle weakness (generalized) (M62.81)  Pain - Right/Left: Right Pain - part of body: Hip    Time: 4270-6237 PT Time Calculation (min) (ACUTE ONLY): 33 min   Charges:   PT Evaluation $PT Eval Moderate Complexity: 1 Mod PT Treatments $Therapeutic Activity: 8-22 mins        Marisa Severin, PT, DPT Acute Rehabilitation Services Pager  [phone] Office [phone]      Marguarite Arbour A Gola Bribiesca 09/24/2019, 1:15 PM

## 2019-09-24 NOTE — Evaluation (Signed)
Occupational Therapy Evaluation Patient Details Name: [name] MRN: [id number] DOB: 05/07/1946 Today's Date: 09/24/2019    History of Present Illness Patient is a 74 y/o female who presents from wound center with BLE ulceration/infections with a necrotic ulcer on the left heel with foul smelling drainage and the right heel is frankly necrotic with underlying calcaneal fracture with a very foul smell at the tip of the toe appears to have bone exposed. Vascular is recommending bilateral AKAs. PMH includes Schizotypal personality disorder, HTN, HLD, DM, retinol detachment.   Clinical Impression   Pt admitted with osteomyelitis. Pt currently with functional limitations due to the deficits listed below (see OT Problem List).  Pt will benefit from skilled OT to increase their safety and independence with ADL and functional mobility for ADL to facilitate discharge to venue listed below.      Follow Up Recommendations  SNF    Equipment Recommendations  None recommended by OT    Recommendations for Other Services       Precautions / Restrictions Precautions Precautions: Fall Restrictions Weight Bearing Restrictions: No Other Position/Activity Restrictions: ulcers all over both feet, exposed bone, open sores      Mobility Bed Mobility Overal bed mobility: Needs Assistance Bed Mobility: Supine to Sit;Sit to Supine     Supine to sit: Max assist;HOB elevated Sit to supine: Mod assist   General bed mobility comments: Assist to bring LEs to EOB and with trunk; increased time. sharp pain in right groin area when coming up. assist to bring LEs into bed to return to supine.  Transfers Overall transfer level: Needs assistance Equipment used: 2 person hand held assist Transfers: Sit to/from [facility] Sit to Stand: Mod assist Stand pivot transfers: Max assist       General transfer comment: pt did not stand with OT    Balance Overall balance assessment: Needs  assistance Sitting-balance support: Feet unsupported;No upper extremity supported Sitting balance-Leahy Scale: Fair Sitting balance - Comments: supervision for safety.   Standing balance support: During functional activity Standing balance-Leahy Scale: Poor Standing balance comment: Requires external assist for standing.                           ADL either performed or assessed with clinical judgement   ADL Overall ADL's : Needs assistance/impaired     Grooming: Wash/dry hands;Wash/dry face;Minimal assistance;Bed level Grooming Details (indicate cue type and reason): HOB raised.  Attempted to perform grooming sitting EOB but pt not able to toleerate BUE in sitting position                               General ADL Comments: Limited ADL eval as pt not able to sit EOB with OT.  Pts legs with foul smell.  Upon sitting pt needed to return to supine due to pain     Vision Patient Visual Report: No change from baseline              Pertinent Vitals/Pain Pain Assessment: Faces Pain Score: 6  Faces Pain Scale: Hurts even more Pain Location: BLE with transiitoing to sitting EOB Pain Descriptors / Indicators: Tender;Sharp Pain Intervention(s): Limited activity within patient's tolerance;Repositioned     Hand Dominance     Extremity/Trunk Assessment Upper Extremity Assessment Upper Extremity Assessment: Generalized weakness(hands with noted arthritis.)   Lower Extremity Assessment Lower Extremity Assessment: Generalized weakness;RLE deficits/detail;LLE deficits/detail RLE  Deficits / Details: Exposed bone at heel, open ulcers everywhere, dusky darkened color. RLE Sensation: decreased light touch LLE Deficits / Details: Ulcers throughout distal LE and foot, bleeding. LLE Sensation: decreased light touch   Cervical / Trunk Assessment Cervical / Trunk Assessment: Kyphotic   Communication Communication Communication: No difficulties   Cognition  Arousal/Alertness: Awake/alert Behavior During Therapy: WFL for tasks assessed/performed Overall Cognitive Status: Impaired/Different from baseline Area of Impairment: Memory;Following commands;Safety/judgement;Awareness                     Memory: Decreased short-term memory Following Commands: Follows one step commands with increased time Safety/Judgement: Decreased awareness of safety;Decreased awareness of deficits Awareness: Intellectual   General Comments: Pt with poor awareness/understanding of state of LEs. "this all just came in 1 day."   General Comments  Ulcers and open wounds in distal BLEs and into feet, exposed bone noted. Darkened, dusky like color distal RLE.            Home Living Family/patient expects to be discharged to:: Private residence Living Arrangements: Children(son stays with her sometimes) Available Help at Discharge: Family;Available PRN/intermittently Type of Home: House Home Access: Stairs to enter [employer] of Steps: 1   Home Layout: One level     Bathroom Shower/Tub: Tub/shower unit         Home Equipment: Environmental consultant - 4 wheels;Bedside commode          Prior Functioning/Environment Level of Independence: Needs assistance  Gait / Transfers Assistance Needed: Uses rollator for ambulation ADL's / Homemaking Assistance Needed: Does her own ADLs. Does sink baths   Comments: SOn has been cooking/cleaning, going to the grocery store. Pt is not the best historian.        OT Problem List: Decreased strength;Decreased range of motion;Decreased activity tolerance;Impaired balance (sitting and/or standing);Decreased safety awareness;Decreased knowledge of use of DME or AE;Decreased knowledge of precautions      OT Treatment/Interventions: Self-care/ADL training;DME and/or AE instruction;Patient/family education;Therapeutic activities    OT Goals(Current goals can be found in the care plan section) Acute Rehab OT  Goals Patient Stated Goal: to keep my legs OT Goal Formulation: With patient Time For Goal Achievement: 10/08/19 Potential to Achieve Goals: Good  OT Frequency: Min 2X/week   Barriers to D/C: Decreased caregiver support             AM-PAC OT "6 Clicks" Daily Activity     Outcome Measure Help from another person eating meals?: Total Help from another person taking care of personal grooming?: A Little   Help from another person bathing (including washing, rinsing, drying)?: A Lot Help from another person to put on and taking off regular upper body clothing?: A Little Help from another person to put on and taking off regular lower body clothing?: Total 6 Click Score: 10   End of Session Nurse Communication: Mobility status  Activity Tolerance: Patient limited by pain Patient left: in bed;with call bell/phone within reach;with bed alarm set  OT Visit Diagnosis: Unsteadiness on feet (R26.81);Other abnormalities of gait and mobility (R26.89);Repeated falls (R29.6);Muscle weakness (generalized) (M62.81);History of falling (Z91.81)                Time: 9024-0973 OT Time Calculation (min): 20 min Charges:  OT General Charges $OT Visit: 1 Visit OT Evaluation $OT Eval Moderate Complexity: 1 Mod  Kari Baars, OT Acute Rehabilitation Services [phone] Office- [phone], Edwena Felty D 09/24/2019, 3:54 PM

## 2019-09-24 NOTE — Progress Notes (Signed)
Patient was admitted to [facility] alert and oriented,from [facility] accompanied by [facility] staff.Orientted to room and staff,V/S checked,attached to cardiac monitoring and CCMD notified.Patient has multiple wound in bilateral lower extremities,no complain of pain.Awaiting for admitting Dr.Will continue to monitor.

## 2019-09-24 NOTE — Progress Notes (Signed)
  Progress Note    09/24/2019 8:09 AM * No surgery found *  Subjective: She is having pain in her right greater than left foot  Vitals:   09/23/19 2238 09/24/19 0443  BP: 123/62 (!) 116/52  Pulse: 95 78  Resp: 14 14  Temp: 97.8 F (36.6 C) 98.2 F (36.8 C)  SpO2: 100% 99%    Physical Exam: She is awake and alert Nonlabored respirations Wounds on feet are stable with very foul smell Palpable left DP  CBC    Component Value Date/Time   WBC 37.7 (H) 09/24/2019 0210   RBC 3.55 (L) 09/24/2019 0210   HGB 9.4 (L) 09/24/2019 0210   HGB WILL FOLLOW 06/02/2019 1343   HCT 31.9 (L) 09/24/2019 0210   HCT WILL FOLLOW 06/02/2019 1343   PLT 308 09/24/2019 0210   PLT WILL FOLLOW 06/02/2019 1343   MCV 89.9 09/24/2019 0210   MCV WILL FOLLOW 06/02/2019 1343   MCH 26.5 09/24/2019 0210   MCHC 29.5 (L) 09/24/2019 0210   RDW 17.9 (H) 09/24/2019 0210   RDW WILL FOLLOW 06/02/2019 1343   LYMPHSABS 1.1 09/23/2019 1548   LYMPHSABS WILL FOLLOW 06/02/2019 1343   MONOABS 0.5 09/23/2019 1548   EOSABS 0.0 09/23/2019 1548   EOSABS WILL FOLLOW 06/02/2019 1343   BASOSABS 0.0 09/23/2019 1548   BASOSABS WILL FOLLOW 06/02/2019 1343    BMET    Component Value Date/Time   NA 137 09/24/2019 0210   NA 136 06/24/2018 1230   K 4.4 09/24/2019 0210   CL 103 09/24/2019 0210   CO2 22 09/24/2019 0210   GLUCOSE 79 09/24/2019 0210   BUN 26 (H) 09/24/2019 0210   BUN 34 (H) 06/24/2018 1230   CREATININE 1.27 (H) 09/24/2019 0210   CREATININE 0.82 07/05/2014 1606   CALCIUM 8.1 (L) 09/24/2019 0210   GFRNONAA 42 (L) 09/24/2019 0210   GFRAA 48 (L) 09/24/2019 0210    INR No results found for: INR   Intake/Output Summary (Last 24 hours) at 09/24/2019 0809 Last data filed at 09/24/2019 8127 Gross per 24 hour  Intake 520.07 ml  Output -  Net 520.07 ml     Assessment/plan:  74 y.o. female is here with gangrenous changes of both feet with what appears to be secondary to small vessel disease given normal  angiogram on the right in the past and current palpable left dorsalis pedis pulse.  The right heel is frankly necrotic with underlying calcaneal fracture and very foul smell in the tip of the toe appears to have bone exposed.  The left foot has a necrotic ulcer on the heel that I am not sure is worth salvaging given that it also has very foul-smelling drainage.  I discussed with her the need for bilateral above-knee amputations at this time she is unwilling to proceed.    Isel Skufca C. Donzetta Matters, MD [facility] Office: [phone] Pager: [phone]  09/24/2019 8:09 AM

## 2019-09-24 NOTE — Discharge Summary (Signed)
[facility] Discharge Summary  Patient name: [name] Medical record number: [id number] Date of birth: [birth year] Age: 74 y.o. Gender: female Date of Admission: 09/23/2019  Date of Discharge: 10/02/2019 Admitting Physician: Zenia Resides, MD  Primary Care Provider: [name], MD Consultants: Vascular surgery  Indication for Hospitalization: Wound infection/bilateral lower extremity ulceration  Discharge Diagnoses/Problem List:  Active Problems:   Osteomyelitis [facility])  Disposition: SNF  Discharge Condition: Stable  Discharge Exam:   Gen: Alert and Oriented to person and place but not time CV: RRR, no murmurs, normal S1, S2 split Resp: CTAB, no wheezing, rales, or rhonchi, comfortable work of breathing Abd: non-distended, non-tender, soft, +bs in all four quadrants Ext: no clubbing, cyanosis, or edema Skin: warm, dry, intact, no rashes Extremities: Bandages clean and dry from prior bilateral AKA's  Brief Hospital Course:  Patient admitted to the emergency department on recommendation of wound care due to chronic bilateral lower extremity wounds with worsening infection.  In the emergency department vascular surgery was consulted and determined the patient would be appropriate for bilateral above-knee amputation.  Patient was started on Zosyn and transition to cefepime.  Emergency department patient also received x-rays which showed a right calcaneus fracture.  Patient also complained of right hip pain after a recent fall and had a hip x-ray which showed no fracture. Blood culture grew Provindencia Rettgeri. Patient's surgery was performed.complication on 12/24/2618 with bilateral above-knee amputations.  After the surgery vascular recommended continuing cefepime for total of 48 additional hours.  After the surgery patient did endorse some phantom limb pain was started on gabapentin to help with this.  Patient did have a drop in her hemoglobin the  day after surgery and required 2 units packed RBCs with this bring her hemoglobin up to 10.  Weight loss Patient with apparent 35 pound weight loss over the past 2.5 months per chart review prior to her surgery.  During admission nutrition was consulted.  An SPEP and UPEP were ordered for concern of multiple myeloma.  However the SPEP showed polyclonal increase in gamma globulin.  Was placed on soft diet partly due to the fact that her dentures had a hole in them making it difficult for the patient to eat some foods.  Bacteremia Blood cultures from 2/4 growing Providencia Rettgeri.  Initially started on Zosyn which was switched to cefepime for total 7 days antibiotic coverage.  Blood cultures drawn on 2/7 were negative for 5days at time of discharge.  Remained afebrile for the entire duration of her hospitalization.  Patient completed her entire course of antibiotics prior to discharge.  Mental Status Patient demonstrated waxing and waning mental status while in the hospital. She would become disoriented at night time. Most likely this is due to a multitude of factors with need of opoid use following bilateral AKA, hospital delirium, and early onset dementia. Her baseline mental status will vary and often times she is not oriented to place or time.   Issues for Follow Up:  1. PCP-follow-up on UPEP for concern of multiple myeloma with weight loss and increased serum protein with decreased albumin. 2. PCP recommend follow-up on other causes of weight loss, the condition of patient's dentures may also play a role. 3. Changed to Lasix 79m once per day on discharge 4. Repeat chest x ray and TSH 4-6 weeks 5. Discontinued Metolazone on discharge   Significant Procedures:   Bilateral above-knee amputations-09/26/2018  Significant Labs and Imaging:  Recent Labs  Lab  09/30/19 0251 10/01/19 0210 10/02/19 0311  WBC 9.8 12.5* 11.8*  HGB 10.2* 10.5* 9.7*  HCT 32.5* 33.8* 30.9*  PLT 187 247 296    Recent Labs  Lab 09/28/19 0222 09/28/19 0222 09/29/19 0301 09/29/19 0301 09/30/19 0251 09/30/19 0251 10/01/19 0210 10/02/19 0311  NA 133*  --  135  --  137  --  135 134*  K 4.2   < > 4.3   < > 4.2   < > 4.3 3.9  CL 103  --  107  --  113*  --  110 104  CO2 22  --  22  --  20*  --  19* 23  GLUCOSE 103*  --  112*  --  75  --  89 120*  BUN 27*  --  34*  --  26*  --  26* 22  CREATININE 1.13*  --  1.42*  --  1.19*  --  1.07* 1.13*  CALCIUM 7.8*  --  8.0*  --  7.8*  --  7.6* 7.6*   < > = values in this interval not displayed.    DG Chest 2 View  Result Date: 09/23/2019 CLINICAL DATA:  No pulses in bilateral feet. EXAM: CHEST - 2 VIEW COMPARISON:  None. FINDINGS: Moderate severity infiltrate is seen within the mid and lower left lung. Increased fibrotic changes are also suspected within the left lung base. There is no evidence of a pleural effusion or pneumothorax. The cardiac silhouette is borderline in size. There is moderate severity calcification of the aortic arch the visualized skeletal structures are unremarkable. Is single dilated small bowel loop is seen within the left upper quadrant. IMPRESSION: 1. Moderate severity infiltrate within the mid and lower left lung with increased fibrotic changes also suspected within the left lung base. 2. Single dilated small bowel loop within the left upper quadrant. Correlation with abdomen pelvis plain films is recommended if a small bowel obstruction or ileus is of clinical concern. Electronically Signed   By: Virgina Norfolk M.D.   On: 09/23/2019 17:50   DG Tibia/Fibula Left  Result Date: 09/23/2019 CLINICAL DATA:  No pulses in bilateral feet. EXAM: LEFT TIBIA AND FIBULA - 2 VIEW COMPARISON:  None. FINDINGS: There is no evidence of fracture or other focal bone lesions. Moderate severity diffuse soft tissue swelling is seen with mild to moderate severity vascular calcification. IMPRESSION: Diffuse soft tissue swelling without evidence of an acute  osseous abnormality. Electronically Signed   By: Virgina Norfolk M.D.   On: 09/23/2019 17:35   DG Tibia/Fibula Right  Result Date: 09/23/2019 CLINICAL DATA:  Wound care. Right upper thigh pain. EXAM: RIGHT TIBIA AND FIBULA - 2 VIEW COMPARISON:  None. FINDINGS: The bones are diffusely osteopenic. Marked lateral compartment osteoarthritis is identified. Vascular calcifications. No fracture or dislocation identified. Mild diffuse soft tissue swelling. A comminuted fracture deformity involving the right calcaneus is identified. The fracture lines appear distinct suggesting acute fracture. IMPRESSION: 1. Comminuted fracture deformity involves the right calcaneus. 2. Right knee osteoarthritis. 3. Osteopenia Electronically Signed   By: Kerby Moors M.D.   On: 09/23/2019 17:47   DG Foot Complete Left  Result Date: 09/23/2019 CLINICAL DATA:  No pulses in bilateral feet. EXAM: LEFT FOOT - COMPLETE 3+ VIEW COMPARISON:  None. FINDINGS: Mild to moderate severity degenerative changes seen involving the mid left foot, involving the visualized subtalar, talonavicular, calcaneocuboid, tarsal and tarsometatarsal articulations. Chronic and degenerative changes are seen along the base of the first left  metatarsal. Chronic changes are also seen along the distal aspect of the fifth left metatarsal. Mild to moderate severity degenerative changes are seen involving the metatarsophalangeal joint of the left great toe. Normal tibial and fibular sesamoid bones. Normal interphalangeal joint of the left great toe. Normal phalanges of the left great toe. Normal second through fifth metatarsophalangeal joints. Normal interphalangeal joints of the lesser toes. Normal phalanges of the lesser toes. Moderate severity diffuse soft tissue swelling is seen. A 2.5 cm superficial soft tissue ulceration is seen along the plantar aspect of the proximal left foot. IMPRESSION: 1. Chronic and degenerative changes without evidence of acute osseous  abnormality. 2. Area of soft tissue ulceration along the plantar aspect of the proximal left foot. Electronically Signed   By: Virgina Norfolk M.D.   On: 09/23/2019 17:48   DG Foot Complete Right  Result Date: 09/23/2019 CLINICAL DATA:  Bilateral feet wound issues EXAM: RIGHT FOOT COMPLETE - 3+ VIEW COMPARISON:  None. FINDINGS: There is diffuse osteopenia which somewhat limits evaluation. The patient is status post trans phalangeal amputation of the distal first digit. The amputation site appears to be intact. There appears to be cystic lucency seen at the fifth metatarsal head, which could represent degenerative changes. Periosteal reaction seen in the second through fourth metatarsal shafts. Diffuse dorsal soft tissue swelling is seen. Arthropathy seen at the midfoot with joint space loss and osteophyte formation. Dense vascular calcifications are seen. IMPRESSION: Status post amputation of the first distal toe. No acute fracture seen. Somewhat limited evaluation, however no definite evidence of acute osteomyelitis. Electronically Signed   By: Prudencio Pair M.D.   On: 09/23/2019 17:48   DG HIP UNILAT WITH PELVIS 2-3 VIEWS RIGHT  Result Date: 09/24/2019 CLINICAL DATA:  Pain in the right hip. EXAM: DG HIP (WITH OR WITHOUT PELVIS) 2-3V RIGHT COMPARISON:  06/16/18 FINDINGS: There is no evidence of hip fracture or dislocation. Moderate degenerative joint disease is noted involving both hip joints including subchondral sclerosis, marginal spur formation and joint space narrowing. Vascular calcifications noted. IMPRESSION: 1. No acute findings. 2. Moderate bilateral hip osteoarthritis. Electronically Signed   By: Kerby Moors M.D.   On: 09/24/2019 08:39    Results/Tests Pending at Time of Discharge: None   Discharge Medications:  Allergies as of 10/02/2019      Reactions   Feraheme [ferumoxytol] Itching   Rosiglitazone Maleate Other (See Comments)   REACTION: Difficulty walking, Fatigue, shortness of  breath      Medication List    STOP taking these medications   metolazone 5 MG tablet Commonly known as: ZAROXOLYN     TAKE these medications   acetaminophen 325 MG tablet Commonly known as: TYLENOL Take 2 tablets (650 mg total) by mouth every 6 (six) hours.   feeding supplement (ENSURE ENLIVE) Liqd Take 237 mLs by mouth 3 (three) times daily between meals.   ferrous sulfate 325 (65 FE) MG tablet Take 1 tablet (325 mg total) by mouth every other day.   furosemide 40 MG tablet Commonly known as: LASIX Take 1 tablet (40 mg total) by mouth daily. Start taking on: October 03, 2019 What changed:   medication strength  how much to take  when to take this   gabapentin 100 MG capsule Commonly known as: NEURONTIN Take 1 capsule (100 mg total) by mouth every 8 (eight) hours.   glucose blood test strip Commonly known as: OneTouch Verio Check blood sugar daily   levothyroxine 150 MCG tablet Commonly known as:  SYNTHROID Take 1 tablet (150 mcg total) by mouth daily.   Microlet Next Lancing Device Misc 1 each by Does not apply route 3 (three) times daily. Check blood sugar 3 times daily   OneTouch Delica Lancets 84Y Misc Check blood sugar once daily   OneTouch Verio w/Device Kit Check sugar 3 times per day   ProAir HFA 108 (90 Base) MCG/ACT inhaler Generic drug: albuterol INHALE 2 PUFFS INTO THE LUNGS EVERY 6 HOURS AS NEEDED FOR WHEEZING OR SHORTNESS OF BREATH   rosuvastatin 20 MG tablet Commonly known as: CRESTOR Take 1 tablet (20 mg total) by mouth daily.       Discharge Instructions: Please refer to Patient Instructions section of EMR for full details.  Patient was counseled important signs and symptoms that should prompt return to medical care, changes in medications, dietary instructions, activity restrictions, and follow up appointments.   Follow-Up Appointments:   Nuala Alpha, DO 10/02/2019, 10:57 AM PGY-3, [place]

## 2019-09-24 NOTE — Progress Notes (Signed)
Family Medicine Teaching Service Daily Progress Note Intern Pager: [phone]  Patient name: [name] Medical record number: [id number] Date of birth: March 09, 1946 Age: 74 y.o. Gender: female  Primary Care Provider: [name], MD Consultants: Vascular surgery Code Status: FULL  Pt Overview and Major Events to Date:  2/4 Admitted, Vasc. surg consulted  Assessment and Plan: [name] is a 74 y.o. female presenting with concern for wound infection with bilateral lower extremity ulcerations secondary to PAD. PMH is significant for PAD, hypothyroid, hypertension, hyperlipidemia, diabetes, HFpEF, rheumatoid arthritis  Wound infection/bilateral lower extremity ulceration secondary to PAD In the emergency department labs were drawn which showed BUN of 33, creatinine 1.47 (baseline appears to be 1.10-1.40), albumin reduced at 2.5, total protein elevated at 9.0.  Patient had a lactic acid initially 1.6 which increased to 2.1 as well as white blood cell count slightly elevated at 12.3.  Patient had an initial mild anemia of 11.1, this was 12.9 on recheck.  Chest x-ray in the ED showed moderate severity infiltrate within the mid and lower left lung with increased fibrotic changes in the left lung base as well as a single dilated small bowel loop in the left upper quadrant.  Left foot x-ray showing chronic and degenerative changes without evidence of acute osseous abnormality and area of soft tissue ulceration along the plantar aspect of the proximal left foot.  Right foot x-ray showing status post amputation of the first digit toe with no acute fractures and no definitive evidence of acute osteomyelitis.  Left tibia/fibula x-ray showing diffuse soft tissue swelling without evidence of acute osseous abnormality.  Right tibia/fibula x-ray showing commuted fracture deformity involving the right calcaneus, right knee osteoarthritis and osteopenia.  Vascular surgery was consulted and patient was  started on Zosyn. BCx w/ GNR.  -Vascular surgery on board, appreciate recommendation -Per vascular's note patient needs bilateral above-knee amputation  -Patient NPO -Palliative consult -IV fluid normal saline at 75 cc/hr while patient is NPO -PT/OT -SCDs for PPx? -Continue Zosyn -Tylenol 650 every 6 hours scheduled.  Can consider oxycodone 5 mg if needed for pain. -Vitals per floor  Right calcaneal fracture Patient with reported significant pain in the right heel while walking.  X-ray showing a comminuted fracture involving the right calcaneus. -Pain relief as above  Right hip pain Patient states she fell few days ago and since then has had significant right hip pain.  On physical exam patient jumps when the lateral aspect of her hip joint is palpated. Hip x-ray w/o fracture. -Pain relief as above  Hypothyroidism  Last TSH 07/19/2019 showing 7.360. Home medications of Synthroid was increased from 125 MCG to 150 mcg at that time.  Home medications currently include Synthroid 150 MCG. -Continue home medication  Hypertension Blood pressure since admission with systolic range 694-854.  Most recent blood pressure 123/65.  Home medications include metolazone 5 mg twice daily. -Continue home medication  Hyperlipidemia Home medication include Crestor 20 mg daily.  Most recent cholesterol in November 2020 showed total cholesterol 101, HDL 47, LDL 42. -Continue home statin  Lower extremity edema/HFpEF Most recent echo 04/03/2018 showed ejection fraction 60-65% with grade 1 diastolic dysfunction.  Home medications include Lasix 160 mg twice daily,  metolazone 5 mg twice weekly. -Discontinue home medications  Weight loss Patient with 35 pound weight loss in the past 2.5 months.  December 2 patient weighing 140 pounds at wound care clinic, today patient weighing 115 pounds.  Possible etiology for weight loss can  include nutritional deficiencies, lack of hunger drive, inaccurate scale  measurements during one recording or the other, malignancy, malabsorption. -Palliative consult as above -Nutrition consult -Recommend outpatient follow-up for etiology weight loss  Type 2 diabetes Patient not taking any home medications.  Last A1c 7.5 on 11/2017.  Blood glucose in the emergency department of 109. -Continue to monitor  History of dyspnea - ?COPD vs asthma Home medications include albuterol inhaler as needed.  Per chart review it appears the patient mostly has difficulty when it is hot outside and uses albuterol inhaler approximately 1-2 times per month for this.  Patient does not have a diagnosed history of asthma or COPD per chart review.  Non-smoker. -Continue as needed albuterol  Rheumatoid arthritis Per chart review patient previously followed by rheumatology and re-referred to rheumatology in October of last year.  Patient had virtual visit with rheumatology on 07/26/2019 at which time orders were placed for rheumatologic labs.  Per that note it seems the patient was previously on methotrexate but stopped in 2013.  Patient currently on no home medications. -Continue to monitor  FEN/GI: NPO. Prophylaxis: SCDs?  Disposition: Med-Surg  Subjective:  She feels ok. Continues to have some right hip discomfort. Walks on her feet and feels everything. Would like to think further about having the surgery.  Objective: Temp:  [97.8 F (36.6 C)-98.4 F (36.9 C)] 98.2 F (36.8 C) (02/05 0443) Pulse Rate:  [78-96] 78 (02/05 0443) Resp:  [14-18] 14 (02/05 0443) BP: (111-144)/(52-90) 116/52 (02/05 0443) SpO2:  [96 %-100 %] 99 % (02/05 0443) Weight:  [52.4 kg] 52.4 kg (02/04 2238)   Physical Exam:  General: Appears well, no acute distress. Age appropriate. Lying in bed.  Cardiac: RRR, normal heart sounds, no murmurs Respiratory: CTAB, normal effort Extremities: LE edema w/ multiple uclerations (see pictures below) Skin: Warm and dry. See pictures below. Neuro: alert  and oriented, no focal deficits Psych: normal affect  Media Information   Document Information  Photos    09/23/2019 15:39  Attached To:  Hospital Encounter on 09/23/19  Source Information  Delia Heady, [place]  [facility]   Media Information    Media Information   Document Information   Media Information   Document Information     Laboratory: Recent Labs  Lab 09/23/19 1548 09/23/19 1820 09/24/19 0210  WBC 12.3*  --  37.7*  HGB 11.1* 12.9 9.4*  HCT 38.4 38.0 31.9*  PLT 344  --  308   Recent Labs  Lab 09/23/19 1548 09/23/19 1820 09/24/19 0210  NA 137 133* 137  K 4.8 5.7* 4.4  CL 101 104 103  CO2 26  --  22  BUN 33* 42* 26*  CREATININE 1.47* 1.40* 1.27*  CALCIUM 8.8*  --  8.1*  PROT 9.0*  --   --   BILITOT 1.0  --   --   ALKPHOS 101  --   --   ALT 10  --   --   AST 12*  --   --   GLUCOSE 109* 95 79    Imaging/Diagnostic Tests: LEFT TIBIA AND FIBULA - 2 VIEW COMPARISON:  None. IMPRESSION: Diffuse soft tissue swelling without evidence of an acute osseous Abnormality.  RIGHT TIBIA AND FIBULA - 2 VIEW COMPARISON:  None. IMPRESSION: 1. Comminuted fracture deformity involves the right calcaneus. 2. Right knee osteoarthritis. 3. Osteopenia  EXAM: RIGHT FOOT COMPLETE - 3+ VIEW COMPARISON:  None. IMPRESSION: Status post amputation of the first distal toe. No acute fracture  seen. Somewhat limited evaluation, however no definite evidence of acute Osteomyelitis.  LEFT FOOT - COMPLETE 3+ VIEW COMPARISON:  None. IMPRESSION: 1. Chronic and degenerative changes without evidence of acute osseous abnormality. 2. Area of soft tissue ulceration along the plantar aspect of the proximal left foot.  CHEST - 2 VIEW COMPARISON:  None. IMPRESSION: 1. Moderate severity infiltrate within the mid and lower left lung with increased fibrotic changes also suspected within the left lung base. 2. Single dilated small bowel loop within the left  upper quadrant. Correlation with abdomen pelvis plain films is recommended if a small bowel obstruction or ileus is of clinical concern.  Gerlene Fee, DO 09/24/2019, 11:14 AM PGY-1, [place] Intern pager: [phone], text pages welcome

## 2019-09-25 DIAGNOSIS — M869 Osteomyelitis, unspecified: Secondary | ICD-10-CM

## 2019-09-25 LAB — BASIC METABOLIC PANEL
Anion gap: 8 (ref 5–15)
BUN: 22 mg/dL (ref 8–23)
CO2: 21 mmol/L — ABNORMAL LOW (ref 22–32)
Calcium: 8 mg/dL — ABNORMAL LOW (ref 8.9–10.3)
Chloride: 106 mmol/L (ref 98–111)
Creatinine, Ser: 1.1 mg/dL — ABNORMAL HIGH (ref 0.44–1.00)
GFR calc Af Amer: 58 mL/min — ABNORMAL LOW (ref >60)
GFR calc non Af Amer: 50 mL/min — ABNORMAL LOW (ref >60)
Glucose, Bld: 92 mg/dL (ref 70–99)
Potassium: 4.1 mmol/L (ref 3.5–5.1)
Sodium: 135 mmol/L (ref 135–145)

## 2019-09-25 LAB — CBC
HCT: 34.3 % — ABNORMAL LOW (ref 36.0–46.0)
Hemoglobin: 10.1 g/dL — ABNORMAL LOW (ref 12.0–15.0)
MCH: 26.2 pg (ref 26.0–34.0)
MCHC: 29.4 g/dL — ABNORMAL LOW (ref 30.0–36.0)
MCV: 88.9 fL (ref 80.0–100.0)
Platelets: 279 10*3/uL (ref 150–400)
RBC: 3.86 MIL/uL — ABNORMAL LOW (ref 3.87–5.11)
RDW: 18.2 % — ABNORMAL HIGH (ref 11.5–15.5)
WBC: 22.8 10*3/uL — ABNORMAL HIGH (ref 4.0–10.5)
nRBC: 0 % (ref 0.0–0.2)

## 2019-09-25 NOTE — Progress Notes (Signed)
FPTS Interim Progress Note Spoke with daughter Mariel Aloe [phone]). Gave her an update on her mother's condition. Her and her husband have great surgical questions that I cannot answer at this time but would be beneficial for Vascular surgery to give them a call before surgery 09/27/2019.   Gerlene Fee, DO 09/25/2019, 4:12 PM PGY-1, [place] Medicine Service pager [phone]

## 2019-09-25 NOTE — Progress Notes (Signed)
Spoke briefly with infectious disease doctor today regarding patient's Providencia infection.  Recommended continuing Zosyn, for 7 to 10-day course total.  As well as repeat blood cultures 72 hours after first starting antibiotics.  This will be roughly 2/7 at 6 PM.

## 2019-09-25 NOTE — Progress Notes (Addendum)
Family Medicine Teaching Service Daily Progress Note Intern Pager: [phone]  Patient name: [name] Medical record number: [id number] Date of birth: [birth year] Age: 74 y.o. Gender: female  Primary Care Provider: [name], MD Consultants: Vascular surgery Code Status: FULL  Pt Overview and Major Events to Date:  2/4 Admitted, Vasc. surg consulted  Assessment and Plan: [name] is a 74 y.o. female presenting with concern for wound infection with bilateral lower extremity ulcerations secondary to PAD. PMH is significant for PAD, hypothyroid, hypertension, hyperlipidemia, diabetes, HFpEF, rheumatoid arthritis  Wound infection/bilateral lower extremity ulceration secondary to PAD Ms. Warf continues to have a hard time making a decision about the double AKA surgery and understandably so.   Left foot x-ray showing chronic and degenerative changes without evidence of acute osseous abnormality and area of soft tissue ulceration along the plantar aspect of the proximal left foot.  Right foot x-ray showing status post amputation of the first digit toe with no acute fractures and no definitive evidence of acute osteomyelitis.  Left tibia/fibula x-ray showing diffuse soft tissue swelling without evidence of acute osseous abnormality.  Right tibia/fibula x-ray showing commuted fracture deformity involving the right calcaneus, right knee osteoarthritis and osteopenia. Vascular surgery was consulted and patient was started on Zosyn. BCx w/ GNR and Providencia Rettgeri.  -Vascular surgery on board, appreciate recommendation: needs bilateral above-knee amputation -Consider ID consult  -Palliative consult -PT/OT -Continue Zosyn -Tylenol 650 every 6 hours scheduled.  Can consider oxycodone 5 mg if needed for pain. -Vitals per floor  Right calcaneal fracture Patient with reported significant pain in the right heel while walking.  X-ray showing a comminuted fracture involving the right  calcaneus. -Pain relief as above  Right hip pain Patient states she fell few days ago and since then has had significant right hip pain. Hip x-ray w/o fracture. -Pain relief as above  Hypothyroidism  Last TSH 07/19/2019 showing 7.360. Home medications of Synthroid was increased from 125 MCG to 150 mcg at that time.  Home medications currently include Synthroid 150 MCG. -Continue home medication  Hypertension Blood pressure since admission with systolic range 626-948.  Most recent blood pressure 121/62.  Home medications include metolazone 5 mg twice daily and lasix 160mg  BID.  -Hold home medication at this time  Hyperlipidemia Home medication include Crestor 20 mg daily.  Most recent cholesterol in November 2020 showed total cholesterol 101, HDL 47, LDL 42. -Continue home statin  Lower extremity edema/HFpEF Most recent echo 04/03/2018 showed ejection fraction 60-65% with grade 1 diastolic dysfunction.  Home medications include Lasix 160 mg twice daily,  metolazone 5 mg twice weekly. -Hold home medications  Weight loss Patient with 35 pound weight loss in the past 2.5 months.  December 2 patient weighing 140 pounds at wound care clinic, today patient weighing 115 pounds.  Possible etiology for weight loss can include nutritional deficiencies, lack of hunger drive, inaccurate scale measurements during one recording or the other, malignancy, malabsorption. -Palliative consult as above -sPEP and upep pending; concern for MM -Nutrition consult -Recommend outpatient follow-up for etiology weight loss  Type 2 diabetes Patient not taking any home medications.  Last A1c 7.5 on 11/2017.  Blood glucose of 79 (2/5). -Continue to monitor  History of dyspnea - ?COPD vs asthma Home medications include albuterol inhaler as needed.  Per chart review it appears the patient mostly has difficulty when it is hot outside and uses albuterol inhaler approximately 1-2 times per month for this.   Patient  does not have a diagnosed history of asthma or COPD per chart review.  Non-smoker. -Continue as needed albuterol  Rheumatoid arthritis Per chart review patient previously followed by rheumatology and re-referred to rheumatology in October of last year.  Patient had virtual visit with rheumatology on 07/26/2019 at which time orders were placed for rheumatologic labs.  Per that note it seems the patient was previously on methotrexate but stopped in 2013.  Patient currently on no home medications. -Continue to monitor  FEN/GI: carb modified. Prophylaxis: Heparin subQ  Disposition: Med-Surg, bilateral AKA pending  Subjective:  She is having a hard time making a decision. Wants to know what recovery from the surgery will be like. She was walking before this and cannot imaging not walking after surgery. She want a more clear plan as to if she does not agree to the surgery what could be done to "treat" her legs.  Objective: Temp:  [97.6 F (36.4 C)-97.8 F (36.6 C)] 97.8 F (36.6 C) (02/06 0425) Pulse Rate:  [62-76] 62 (02/06 0425) Resp:  [9-16] 9 (02/06 0425) BP: (108-121)/(60-90) 121/62 (02/06 0425) SpO2:  [99 %-100 %] 100 % (02/06 0425)   Physical Exam:  General: Appears well, no acute distress. Age appropriate. Lying in bed.  Cardiac: RRR, normal heart sounds, no murmurs Respiratory: CTAB, normal effort Extremities: BLE edema w/ multiple necrotic wounds (look at media tab) Skin: Warm and dry, venous stasis rashes Neuro: alert and oriented, no focal deficits Psych: normal affect  Laboratory Recent Labs  Lab 09/23/19 1548 09/23/19 1820 09/24/19 0210  WBC 12.3*  --  37.7*  HGB 11.1* 12.9 9.4*  HCT 38.4 38.0 31.9*  PLT 344  --  308   Recent Labs  Lab 09/23/19 1548 09/23/19 1820 09/24/19 0210  NA 137 133* 137  K 4.8 5.7* 4.4  CL 101 104 103  CO2 26  --  22  BUN 33* 42* 26*  CREATININE 1.47* 1.40* 1.27*  CALCIUM 8.8*  --  8.1*  PROT 9.0*  --   --   BILITOT  1.0  --   --   ALKPHOS 101  --   --   ALT 10  --   --   AST 12*  --   --   GLUCOSE 109* 95 79    Imaging/Diagnostic Tests: No new imaging.  Gerlene Fee, DO 09/25/2019, 9:33 AM PGY-1, [place] Intern pager: [phone], text pages welcome

## 2019-09-25 NOTE — Progress Notes (Addendum)
[facility]  Subjective  - No new complaints   Objective 121/62 62 97.8 F (36.6 C) (Oral) (!) 9 100%  Intake/Output Summary (Last 24 hours) at 09/25/2019 4098 Last data filed at 09/25/2019 1191 Gross per 24 hour  Intake --  Output 300 ml  Net -300 ml    Bilateral LE non healing wounds malodor not healing  Lungs non labored breathing A & O Gen NAD   Assessment/Planning: Dr. Donzetta Matters has recommended B AKA.    She has small vessel disease given normal angiogram on the right in the past and current palpable left dorsalis pedis pulse.  We will schedule her for B AKA Monday 09/27/19  Roxy Horseman 09/25/2019 9:37 AM --  Pt now consenting to bilateral AKA On OR schedule for Dr Scot Dock on Monday  Ruta Hinds, MD [facility] [place] Office: [phone]  Laboratory Lab Results: Recent Labs    09/24/19 0210 09/25/19 0820  WBC 37.7* 22.8*  HGB 9.4* 10.1*  HCT 31.9* 34.3*  PLT 308 279   BMET Recent Labs    09/24/19 0210 09/25/19 0820  NA 137 135  K 4.4 4.1  CL 103 106  CO2 22 21*  GLUCOSE 79 92  BUN 26* 22  CREATININE 1.27* 1.10*  CALCIUM 8.1* 8.0*    COAG No results found for: INR, PROTIME No results found for: PTT

## 2019-09-26 LAB — BASIC METABOLIC PANEL
Anion gap: 5 (ref 5–15)
BUN: 23 mg/dL (ref 8–23)
CO2: 22 mmol/L (ref 22–32)
Calcium: 7.9 mg/dL — ABNORMAL LOW (ref 8.9–10.3)
Chloride: 108 mmol/L (ref 98–111)
Creatinine, Ser: 1.21 mg/dL — ABNORMAL HIGH (ref 0.44–1.00)
GFR calc Af Amer: 51 mL/min — ABNORMAL LOW (ref >60)
GFR calc non Af Amer: 44 mL/min — ABNORMAL LOW (ref >60)
Glucose, Bld: 82 mg/dL (ref 70–99)
Potassium: 4.1 mmol/L (ref 3.5–5.1)
Sodium: 135 mmol/L (ref 135–145)

## 2019-09-26 LAB — CULTURE, BLOOD (ROUTINE X 2)

## 2019-09-26 LAB — MRSA PCR SCREENING: MRSA by PCR: NEGATIVE

## 2019-09-26 MED ORDER — SODIUM CHLORIDE 0.9 % IV SOLN
1.5000 g | INTRAVENOUS | Status: AC
  Administered 2019-09-27: 1.5 g via INTRAVENOUS
  Filled 2019-09-26: qty 1.5

## 2019-09-26 MED ORDER — SODIUM CHLORIDE 0.9 % IV SOLN
2.0000 g | Freq: Two times a day (BID) | INTRAVENOUS | Status: DC
  Administered 2019-09-26 – 2019-09-30 (×8): 2 g via INTRAVENOUS
  Filled 2019-09-26 (×8): qty 2

## 2019-09-26 NOTE — Progress Notes (Signed)
Pharmacy Antibiotic Note  [name] is a 74 y.o. female with PMH of PAD, hypothyroid, hypertension, hyperlipidemia, diabetes, HFpEF, rheumatoid arthritis who was admitted on 09/23/2019 with concern for wound infection with bilateral lower extremity ulcerations secondary to PAD.  Blood cultures are demonstrating growth of providencia rettgeri. There are reports of inducible amp-c beta lactamase resistance with the use of Zosyn and ceftriaxone with this organism. Therapy will be switched to cefepime per discussion with Dr. Garlan Fillers. Pharmacy has been consulted to dose cefepime.  WBC is improving from 33.7 to 22.8. Pt is now afebrile and vital signs are stable. Renal function is stable with CrCl around 30-35 mL/min.  Plan: Stop IV Zosyn Change to Cefepime 2g IV q12 hours Monitor WBC, Temp, vital signs, cultures, and clinical status Repeat blood cultures today F/U LOT  Height: 5\' 2"  (157.5 cm) Weight: 115 lb 8.3 oz (52.4 kg) IBW/kg (Calculated) : 50.1  Temp (24hrs), Avg:97.7 F (36.5 C), Min:97.2 F (36.2 C), Max:98 F (36.7 C)  Recent Labs  Lab 09/23/19 1548 09/23/19 1615 09/23/19 1749 09/23/19 1820 09/24/19 0210 09/25/19 0820 09/26/19 0636  WBC 12.3*  --   --   --  37.7* 22.8*  --   CREATININE 1.47*  --   --  1.40* 1.27* 1.10* 1.21*  LATICACIDVEN  --  1.6 2.1*  --   --   --   --     Estimated Creatinine Clearance: 32.8 mL/min (A) (by C-G formula based on SCr of 1.21 mg/dL (H)).    Allergies  Allergen Reactions  . Feraheme [Ferumoxytol] Itching  . Rosiglitazone Maleate Other (See Comments)    REACTION: Difficulty walking, Fatigue, shortness of breath    Antimicrobials this admission: Zosyn 2/4>>  Microbiology results: 2/4 BCx >> Providencia Rettgeri (R to amp and cefazolin) 2/4 COVID/Influenza: negative  Thank you for allowing pharmacy to be a part of this patient's care.  Sherren Kerns, PharmD PGY1 Acute Care Pharmacy Resident 09/26/2019 11:29 AM

## 2019-09-26 NOTE — Progress Notes (Signed)
Discussed with Dr. Garlan Fillers.  Consult was put in on 2/4 when patient was very hesitant about having bilateral AKA.  Now she has had time to process and is much more confident about the surgery.    Will defer consult for now.   PMT will remain available.    Please reconsult [place] after surgery if needed.  Florentina Jenny, PA-C Palliative Medicine Office:  [phone]

## 2019-09-26 NOTE — Progress Notes (Signed)
FPTS Interim Progress Note  S: Patient doing well, no complaints of fevers, chills, nausea, or vomiting.  O: BP (!) 113/56 (BP Location: Right Arm)   Pulse 64   Temp 98 F (36.7 C) (Oral)   Resp 16   Ht 5\' 2"  (1.575 m)   Wt 52.4 kg   SpO2 100%   BMI 21.13 kg/m     A/P: Received call from pharmacy that although her BCx sensitivities did show the Providencia Rettgeri was susceptible to Zosyn there have been instances where a resistance develops over course of treatment. Recommending switching to Cefepime and I agree.   Continue for total of 7 day course of IV Abx. We are getting repeat BCx today at 6pm. Will follow those results.  Nuala Alpha, DO 09/26/2019, 11:28 AM PGY-3, [place] Medicine Service pager [phone]

## 2019-09-26 NOTE — H&P (View-Only) (Signed)
[facility]  Subjective  - No new complaints, comfortable.   Objective (!) 113/56 64 98 F (36.7 C) (Oral) 16 100%  Intake/Output Summary (Last 24 hours) at 09/26/2019 0935 Last data filed at 09/26/2019 0630 Gross per 24 hour  Intake 421.78 ml  Output 270 ml  Net 151.78 ml    B LE gangrene non healing wounds. Lungs non labored breathing Gen NAD  Assessment/Planning: Plan B AK tomorrow 09/27/19  NPO past MN Patient in agreement  [name] 09/26/2019 9:35 AM --  Agree with above Bilateral AKA Dr Scot Dock tomorrow  Ruta Hinds, MD [facility] [place] Office: [phone]  Laboratory Lab Results: Recent Labs    09/24/19 0210 09/25/19 0820  WBC 37.7* 22.8*  HGB 9.4* 10.1*  HCT 31.9* 34.3*  PLT 308 279   BMET Recent Labs    09/25/19 0820 09/26/19 0636  NA 135 135  K 4.1 4.1  CL 106 108  CO2 21* 22  GLUCOSE 92 82  BUN 22 23  CREATININE 1.10* 1.21*  CALCIUM 8.0* 7.9*    COAG No results found for: INR, PROTIME No results found for: PTT

## 2019-09-26 NOTE — Progress Notes (Signed)
Family Medicine Teaching Service Daily Progress Note Intern Pager: [phone]  Patient name: [name] Medical record number: [id number] Date of birth: 05/26/1946 Age: 74 y.o. Gender: female  Primary Care Provider: [name], MD Consultants: Vascular surgery Code Status: FULL  Pt Overview and Major Events to Date:  2/4 Admitted, Vasc. surg consulted  Assessment and Plan: [name] is a 74 y.o. female presenting with concern for wound infection with bilateral lower extremity ulcerations secondary to PAD. PMH is significant for PAD, hypothyroid, hypertension, hyperlipidemia, diabetes, HFpEF, rheumatoid arthritis  Wound infection/bilateral lower extremity ulceration secondary to PAD Acute on Chronic. Patient agreeable to bilateral AKA and is aware she is scheduled for tomorrow. Stable. Left foot x-ray showing chronic and degenerative changes without evidence of acute osseous abnormality and area of soft tissue ulceration along the plantar aspect of the proximal left foot.  Right foot x-ray showing status post amputation of the first digit toe with no acute fractures and no definitive evidence of acute osteomyelitis.  Left tibia/fibula x-ray showing diffuse soft tissue swelling without evidence of acute osseous abnormality.  Right tibia/fibula x-ray showing commuted fracture deformity involving the right calcaneus, right knee osteoarthritis and osteopenia. -Vascular surgery on board, appreciate recommendation: needs bilateral above-knee amputation on schedule for 2/8 with Dr. Bobette Mo - ID recs: Cont Zosyn 7-10 day course and repeat BCx in 48hours; will obtain repeat BCx today at 6pm -Palliative consult; awaiting recs -PT/OT: SNF, Wheelchair, supervision for mobility -Tylenol 650 every 6 hours scheduled.  Can consider oxycodone 5 mg if needed for pain. -Vitals per floor  Right calcaneal fracture Patient with reported significant pain in the right heel while walking.  X-ray  showing a comminuted fracture involving the right calcaneus. -Pain relief as above  Right hip pain Patient states she fell few days ago and since then has had significant right hip pain. Hip x-ray w/o fracture. -Pain relief as above  Hypothyroidism  Last TSH 07/19/2019 showing 7.360. Home medications of Synthroid was increased from 125 MCG to 150 mcg at that time.  Home medications currently include Synthroid 150 MCG. -Continue home medication  Hypertension Chronic, stable. SBP range of 113-130 and DBP range of 56-65 in past 24 hours. Home medications include metolazone 5 mg twice daily and lasix 160mg  BID.  - Cont to hold home metolazone 5mg  and Lasix 160mg  BID - Cont to monitor fluid status and UOP  Hyperlipidemia Chronic, stable. Home medication include Crestor 20 mg daily.  Most recent cholesterol in November 2020 showed total cholesterol 101, HDL 47, LDL 42. -Continue home statin  Lower extremity edema/HFpEF Most recent echo 04/03/2018 showed ejection fraction 60-65% with grade 1 diastolic dysfunction.  Home medications include Lasix 160 mg twice daily,  metolazone 5 mg twice weekly. -Hold home medications  Weight loss Patient with 35 pound weight loss in the past 2.5 months.  December 2 patient weighing 140 pounds at wound care clinic, today patient weighing 115 pounds.  Possible etiology for weight loss can include nutritional deficiencies, lack of hunger drive, inaccurate scale measurements during one recording or the other, malignancy, malabsorption. -Palliative consult as above -sPEP and upep pending; concern for MM -Nutrition consult -Recommend outpatient follow-up for etiology weight loss  Type 2 diabetes Patient not taking any home medications.  Last A1c 7.5 on 11/2017.  Blood glucose of 79 (2/5). -Continue to monitor  History of dyspnea - ?COPD vs asthma Home medications include albuterol inhaler as needed.  Per chart review it appears the patient  mostly has  difficulty when it is hot outside and uses albuterol inhaler approximately 1-2 times per month for this.  Patient does not have a diagnosed history of asthma or COPD per chart review.  Non-smoker. -Continue as needed albuterol  Rheumatoid arthritis Per chart review patient previously followed by rheumatology and re-referred to rheumatology in October of last year.  Patient had virtual visit with rheumatology on 07/26/2019 at which time orders were placed for rheumatologic labs.  Per that note it seems the patient was previously on methotrexate but stopped in 2013.  Patient currently on no home medications. -Continue to monitor  FEN/GI: carb modified. Prophylaxis: Heparin subQ  Disposition: Med-Surg, bilateral AKA pending  Subjective:  She is awake, alert, and still agreeable to surgery this am. She wanted to know if it was for sure they would be amputated and I did inform her that both of her legs would be removed, the surgery was not exploratory. She is still agreeable to proceed with surgery tomorrow. No other complaints at this time.  Objective: Temp:  [97.2 F (36.2 C)-98 F (36.7 C)] 98 F (36.7 C) (02/07 0743) Pulse Rate:  [63-68] 64 (02/07 0743) Resp:  [12-18] 16 (02/07 0743) BP: (113-130)/(56-65) 113/56 (02/07 0743) SpO2:  [100 %] 100 % (02/07 0743)   Physical Exam:  Gen: Alert and Oriented x 3, NAD CV: RRR, no murmurs, normal S1, S2 split Resp: CTAB, no wheezing, rales, or rhonchi, comfortable work of breathing Abd: non-distended, non-tender, soft, +bs in all four quadrants Ext: no clubbing, cyanosis, or edema  Laboratory Recent Labs  Lab 09/23/19 1548 09/23/19 1548 09/23/19 1820 09/24/19 0210 09/25/19 0820  WBC 12.3*  --   --  37.7* 22.8*  HGB 11.1*   < > 12.9 9.4* 10.1*  HCT 38.4   < > 38.0 31.9* 34.3*  PLT 344  --   --  308 279   < > = values in this interval not displayed.   Recent Labs  Lab 09/23/19 1548 09/23/19 1820 09/24/19 0210 09/25/19 0820  09/26/19 0636  NA 137   < > 137 135 135  K 4.8   < > 4.4 4.1 4.1  CL 101   < > 103 106 108  CO2 26   < > 22 21* 22  BUN 33*   < > 26* 22 23  CREATININE 1.47*   < > 1.27* 1.10* 1.21*  CALCIUM 8.8*   < > 8.1* 8.0* 7.9*  PROT 9.0*  --   --   --   --   BILITOT 1.0  --   --   --   --   ALKPHOS 101  --   --   --   --   ALT 10  --   --   --   --   AST 12*  --   --   --   --   GLUCOSE 109*   < > 79 92 82   < > = values in this interval not displayed.    Imaging/Diagnostic Tests: No new imaging.  Nuala Alpha, DO 09/26/2019, 8:06 AM PGY-3, [place] Intern pager: [phone], text pages welcome

## 2019-09-26 NOTE — Progress Notes (Addendum)
[facility]  Subjective  - No new complaints, comfortable.   Objective (!) 113/56 64 98 F (36.7 C) (Oral) 16 100%  Intake/Output Summary (Last 24 hours) at 09/26/2019 0935 Last data filed at 09/26/2019 0630 Gross per 24 hour  Intake 421.78 ml  Output 270 ml  Net 151.78 ml    B LE gangrene non healing wounds. Lungs non labored breathing Gen NAD  Assessment/Planning: Plan B AK tomorrow 09/27/19  NPO past MN Patient in agreement  Roxy Horseman 09/26/2019 9:35 AM --  Agree with above Bilateral AKA Dr Scot Dock tomorrow  Ruta Hinds, MD [facility] [place] Office: [phone]  Laboratory Lab Results: Recent Labs    09/24/19 0210 09/25/19 0820  WBC 37.7* 22.8*  HGB 9.4* 10.1*  HCT 31.9* 34.3*  PLT 308 279   BMET Recent Labs    09/25/19 0820 09/26/19 0636  NA 135 135  K 4.1 4.1  CL 106 108  CO2 21* 22  GLUCOSE 92 82  BUN 22 23  CREATININE 1.10* 1.21*  CALCIUM 8.0* 7.9*    COAG No results found for: INR, PROTIME No results found for: PTT

## 2019-09-26 NOTE — TOC Initial Note (Signed)
Transition of Care [facility]) - Initial/Assessment Note    Patient Details  Name: [name] MRN: [id number] Date of Birth: 1946-06-20  [facility] CM/SW Contact:    Kari Castilla, LCSW Phone Number: [phone] 09/26/2019, 11:54 AM  Clinical Narrative:                 CSW met with patient to discuss PT recommendation of a SNF. Patient was aware of recommendation and in agreement with going to a [facility] SNF. CSW discussed the SNF process.CSW provided patient with medicare.gov rating list. Patient informed CSW that her grandson stays with her sometimes. Patient stated that she had not been a SNF before. Patient gave CSW permission to fax referrals out to local facilities.CSW answered questions about the SNF process and the next steps in the process. Patient asked CSW to follow up with her daughter [name] to update her bout patient's discharge plans.  CSW spoke with daughter [name] and explained the process and answered all of her questions. CSW provided [name] with medicare.gov website to review ratings. Daughter stated that her mother was able to make her own decisions and would like to be included in the process of choosing the facility.  TOC team will continue to follow for discharge planning needs.     Expected Discharge Plan: Skilled Nursing Facility Barriers to Discharge: Continued Medical Work up, SNF Pending bed offer   Patient Goals and CMS Choice Patient states their goals for this hospitalization and ongoing recovery are:: To be able to walk better CMS Medicare.gov Compare Post Acute Care list provided to:: Patient Choice offered to / list presented to : Patient  Expected Discharge Plan and Services Expected Discharge Plan: [place] arrangements for the past 2 months: Single Family Home                                      Prior Living Arrangements/Services Living arrangements for the past 2 months: Single Family  Home Lives with:: Self, Relatives(grandson sometimes) Patient language and need for interpreter reviewed:: Yes Do you feel safe going back to the place where you live?: Yes        Care giver support system in place?: Yes (comment)      Activities of Daily Living      Permission Sought/Granted   Permission granted to share information with : Yes, Verbal Permission Granted  Share Information with NAME: [name]  Permission granted to share info w AGENCY: SNFs  Permission granted to share info w Relationship: Daughter  Permission granted to share info w Contact Information: [phone]  Emotional Assessment Appearance:: Appears stated age Attitude/Demeanor/Rapport: Engaged Affect (typically observed): Accepting, Adaptable Orientation: : Oriented to Self, Oriented to Place, Oriented to  Time, Oriented to Situation      Admission diagnosis:  Peripheral vascular disease ([place]) [I73.9] Osteomyelitis ([place]) [M86.9] Wound infection [T14.8XXA, L08.9] Patient Active Problem List   Diagnosis Date Noted  . Wound infection   . Critical lower limb ischemia   . Moderate protein-calorie malnutrition ([place])   . Osteomyelitis ([place]) 09/23/2019  . Left breast mass 06/02/2019  . Stress 06/02/2019  . Peripheral vascular disease ([place]) 07/22/2018  . Wound of lower extremity, right, initial encounter 06/24/2018  . Dyspnea 03/23/2018  . Impaired functional mobility, balance, gait, and endurance 03/02/2018    Class: Diagnosis of  .  Abnormality of gait 02/26/2018  . Iron deficiency anemia 03/19/2017  . Vision disturbance 03/20/2015  . Memory difficulty 03/20/2015  . At high risk for falls 11/10/2012  . DIABETIC  RETINOPATHY 03/17/2007  . DIABETIC PERIPHERAL NEUROPATHY 03/17/2007  . Hypothyroidism 10/16/2006  . Type 2 diabetes mellitus ([place]) 10/16/2006  . OBESITY, NOS 10/16/2006  . HYPERTENSION, BENIGN SYSTEMIC 10/16/2006  . GASTROESOPHAGEAL REFLUX, NO ESOPHAGITIS 10/16/2006  . Rheumatoid  arthritis ([place]) 10/16/2006  . OSTEOPENIA 10/16/2006  . INCONTINENCE, URGE 10/16/2006   PCP:  [name], MD Pharmacy:   [id number] [id number]: [phone]: [phone]    Social Determinants of Health (SDOH) Interventions    Readmission Risk Interventions No flowsheet data found.

## 2019-09-27 ENCOUNTER — Encounter (HOSPITAL_COMMUNITY)
Admission: EM | Disposition: A | Discharge status: Skilled Nursing Facility | Payer: Self-pay | Source: Home / Self Care | Attending: Family Medicine

## 2019-09-27 ENCOUNTER — Encounter (HOSPITAL_COMMUNITY): Payer: Self-pay | Admitting: Family Medicine

## 2019-09-27 ENCOUNTER — Inpatient Hospital Stay (HOSPITAL_COMMUNITY): Payer: Medicare Other | Admitting: Certified Registered Nurse Anesthetist

## 2019-09-27 HISTORY — PX: AMPUTATION: SHX166

## 2019-09-27 HISTORY — PX: ABOVE KNEE LEG AMPUTATION: SUR20

## 2019-09-27 LAB — GLUCOSE, CAPILLARY
Glucose-Capillary: 71 mg/dL (ref 70–99)
Glucose-Capillary: 70 mg/dL (ref 70–99)
Glucose-Capillary: 109 mg/dL — ABNORMAL HIGH (ref 70–99)
Glucose-Capillary: 59 mg/dL — ABNORMAL LOW (ref 70–99)
Glucose-Capillary: 83 mg/dL (ref 70–99)

## 2019-09-27 LAB — PROTEIN ELECTROPHORESIS, SERUM
A/G Ratio: 0.4 — ABNORMAL LOW (ref 0.7–1.7)
Albumin ELP: 1.8 g/dL — ABNORMAL LOW (ref 2.9–4.4)
Alpha-1-Globulin: 0.4 g/dL (ref 0.0–0.4)
Alpha-2-Globulin: 0.8 g/dL (ref 0.4–1.0)
Beta Globulin: 0.8 g/dL (ref 0.7–1.3)
Gamma Globulin: 2.7 g/dL — ABNORMAL HIGH (ref 0.4–1.8)
Globulin, Total: 4.7 g/dL — ABNORMAL HIGH (ref 2.2–3.9)
Total Protein ELP: 6.5 g/dL (ref 6.0–8.5)

## 2019-09-27 SURGERY — AMPUTATION, ABOVE KNEE
Anesthesia: General | Site: Knee | Laterality: Bilateral

## 2019-09-27 MED ORDER — BISACODYL 5 MG PO TBEC: 5.0000 mg | DELAYED_RELEASE_TABLET | Freq: Every day | ORAL | Status: DC | PRN

## 2019-09-27 MED ORDER — PROPOFOL 10 MG/ML IV BOLUS
INTRAVENOUS | Status: AC
  Filled 2019-09-27: qty 20

## 2019-09-27 MED ORDER — MAGNESIUM SULFATE 2 GM/50ML IV SOLN: 2.0000 g | Freq: Every day | INTRAVENOUS | Status: DC | PRN

## 2019-09-27 MED ORDER — ACETAMINOPHEN 10 MG/ML IV SOLN: 1000.0000 mg | Freq: Once | INTRAVENOUS | Status: DC | PRN

## 2019-09-27 MED ORDER — SUGAMMADEX SODIUM 200 MG/2ML IV SOLN
INTRAVENOUS | Status: DC | PRN
  Administered 2019-09-27: 100 mg via INTRAVENOUS

## 2019-09-27 MED ORDER — LACTATED RINGERS IV SOLN: INTRAVENOUS | Status: DC

## 2019-09-27 MED ORDER — ONDANSETRON HCL 4 MG/2ML IJ SOLN
INTRAMUSCULAR | Status: DC | PRN
  Administered 2019-09-27: 4 mg via INTRAVENOUS

## 2019-09-27 MED ORDER — DEXTROSE 50 % IV SOLN
INTRAVENOUS | Status: AC
  Administered 2019-09-27: 25 mL via INTRAVENOUS
  Filled 2019-09-27: qty 50

## 2019-09-27 MED ORDER — ROCURONIUM BROMIDE 100 MG/10ML IV SOLN
INTRAVENOUS | Status: DC | PRN
  Administered 2019-09-27: 40 mg via INTRAVENOUS

## 2019-09-27 MED ORDER — PANTOPRAZOLE SODIUM 40 MG PO TBEC
40.0000 mg | DELAYED_RELEASE_TABLET | Freq: Every day | ORAL | Status: DC
  Administered 2019-09-27 – 2019-10-02 (×6): 40 mg via ORAL
  Filled 2019-09-27 (×6): qty 1

## 2019-09-27 MED ORDER — DEXAMETHASONE SODIUM PHOSPHATE 10 MG/ML IJ SOLN
INTRAMUSCULAR | Status: DC | PRN
  Administered 2019-09-27: 4 mg via INTRAVENOUS

## 2019-09-27 MED ORDER — MIDAZOLAM HCL 2 MG/2ML IJ SOLN
INTRAMUSCULAR | Status: AC
  Filled 2019-09-27: qty 2

## 2019-09-27 MED ORDER — OXYCODONE HCL 5 MG/5ML PO SOLN: 5.0000 mg | Freq: Once | ORAL | Status: DC | PRN

## 2019-09-27 MED ORDER — ACETAMINOPHEN 325 MG RE SUPP: 325.0000 mg | RECTAL | Status: DC | PRN

## 2019-09-27 MED ORDER — ALUM & MAG HYDROXIDE-SIMETH 200-200-20 MG/5ML PO SUSP: 15.0000 mL | ORAL | Status: DC | PRN

## 2019-09-27 MED ORDER — BACITRACIN ZINC 500 UNIT/GM EX OINT
TOPICAL_OINTMENT | CUTANEOUS | Status: AC
  Filled 2019-09-27: qty 28.35

## 2019-09-27 MED ORDER — 0.9 % SODIUM CHLORIDE (POUR BTL) OPTIME
TOPICAL | Status: DC | PRN
  Administered 2019-09-27: 1000 mL

## 2019-09-27 MED ORDER — DOCUSATE SODIUM 100 MG PO CAPS
100.0000 mg | ORAL_CAPSULE | Freq: Every day | ORAL | Status: DC
  Administered 2019-09-28 – 2019-10-02 (×4): 100 mg via ORAL
  Filled 2019-09-27 (×4): qty 1

## 2019-09-27 MED ORDER — ACETAMINOPHEN 500 MG PO TABS: 1000.0000 mg | ORAL_TABLET | Freq: Once | ORAL | Status: DC | PRN

## 2019-09-27 MED ORDER — METOPROLOL TARTRATE 5 MG/5ML IV SOLN: 2.0000 mg | INTRAVENOUS | Status: DC | PRN

## 2019-09-27 MED ORDER — HYDRALAZINE HCL 20 MG/ML IJ SOLN: 5.0000 mg | INTRAMUSCULAR | Status: DC | PRN

## 2019-09-27 MED ORDER — DEXAMETHASONE SODIUM PHOSPHATE 10 MG/ML IJ SOLN
INTRAMUSCULAR | Status: AC
  Filled 2019-09-27: qty 1

## 2019-09-27 MED ORDER — PHENYLEPHRINE 40 MCG/ML (10ML) SYRINGE FOR IV PUSH (FOR BLOOD PRESSURE SUPPORT)
PREFILLED_SYRINGE | INTRAVENOUS | Status: DC | PRN
  Administered 2019-09-27: 80 ug via INTRAVENOUS
  Administered 2019-09-27: 40 ug via INTRAVENOUS

## 2019-09-27 MED ORDER — OXYCODONE HCL 5 MG PO TABS
5.0000 mg | ORAL_TABLET | ORAL | Status: DC | PRN
  Administered 2019-09-27 – 2019-09-28 (×3): 5 mg via ORAL
  Administered 2019-09-28: 10 mg via ORAL
  Filled 2019-09-27: qty 2
  Filled 2019-09-27 (×3): qty 1

## 2019-09-27 MED ORDER — FENTANYL CITRATE (PF) 100 MCG/2ML IJ SOLN
INTRAMUSCULAR | Status: DC | PRN
  Administered 2019-09-27 (×3): 25 ug via INTRAVENOUS
  Administered 2019-09-27: 50 ug via INTRAVENOUS
  Administered 2019-09-27: 75 ug via INTRAVENOUS
  Administered 2019-09-27: 50 ug via INTRAVENOUS

## 2019-09-27 MED ORDER — BACITRACIN ZINC 500 UNIT/GM EX OINT
TOPICAL_OINTMENT | CUTANEOUS | Status: DC | PRN
  Administered 2019-09-27: 1 via TOPICAL

## 2019-09-27 MED ORDER — OXYCODONE HCL 5 MG PO TABS: 5.0000 mg | ORAL_TABLET | Freq: Once | ORAL | Status: DC | PRN

## 2019-09-27 MED ORDER — FENTANYL CITRATE (PF) 100 MCG/2ML IJ SOLN: 25.0000 ug | INTRAMUSCULAR | Status: DC | PRN

## 2019-09-27 MED ORDER — ONDANSETRON HCL 4 MG/2ML IJ SOLN
4.0000 mg | Freq: Four times a day (QID) | INTRAMUSCULAR | Status: DC | PRN
  Administered 2019-09-28 – 2019-09-29 (×3): 4 mg via INTRAVENOUS
  Filled 2019-09-27 (×3): qty 2

## 2019-09-27 MED ORDER — FENTANYL CITRATE (PF) 250 MCG/5ML IJ SOLN
INTRAMUSCULAR | Status: AC
  Filled 2019-09-27: qty 5

## 2019-09-27 MED ORDER — SENNOSIDES-DOCUSATE SODIUM 8.6-50 MG PO TABS: 1.0000 | ORAL_TABLET | Freq: Every evening | ORAL | Status: DC | PRN

## 2019-09-27 MED ORDER — LIDOCAINE 2% (20 MG/ML) 5 ML SYRINGE
INTRAMUSCULAR | Status: DC | PRN
  Administered 2019-09-27: 40 mg via INTRAVENOUS

## 2019-09-27 MED ORDER — HYDROMORPHONE HCL 1 MG/ML IJ SOLN
0.5000 mg | INTRAMUSCULAR | Status: DC | PRN
  Administered 2019-09-28 (×2): 1 mg via INTRAVENOUS
  Filled 2019-09-27 (×2): qty 1

## 2019-09-27 MED ORDER — PHENOL 1.4 % MT LIQD: 1.0000 | OROMUCOSAL | Status: DC | PRN

## 2019-09-27 MED ORDER — GUAIFENESIN-DM 100-10 MG/5ML PO SYRP: 15.0000 mL | ORAL_SOLUTION | ORAL | Status: DC | PRN

## 2019-09-27 MED ORDER — LABETALOL HCL 5 MG/ML IV SOLN: 10.0000 mg | INTRAVENOUS | Status: DC | PRN

## 2019-09-27 MED ORDER — ROCURONIUM BROMIDE 10 MG/ML (PF) SYRINGE
PREFILLED_SYRINGE | INTRAVENOUS | Status: AC
  Filled 2019-09-27: qty 10

## 2019-09-27 MED ORDER — POTASSIUM CHLORIDE CRYS ER 20 MEQ PO TBCR: 20.0000 meq | EXTENDED_RELEASE_TABLET | Freq: Every day | ORAL | Status: DC | PRN

## 2019-09-27 MED ORDER — ONDANSETRON HCL 4 MG/2ML IJ SOLN
INTRAMUSCULAR | Status: AC
  Filled 2019-09-27: qty 2

## 2019-09-27 MED ORDER — LIDOCAINE 2% (20 MG/ML) 5 ML SYRINGE
INTRAMUSCULAR | Status: AC
  Filled 2019-09-27: qty 10

## 2019-09-27 MED ORDER — DEXTROSE 50 % IV SOLN
25.0000 mL | Freq: Once | INTRAVENOUS | Status: AC
  Filled 2019-09-27: qty 50

## 2019-09-27 MED ORDER — FLEET ENEMA 7-19 GM/118ML RE ENEM: 1.0000 | ENEMA | Freq: Once | RECTAL | Status: DC | PRN

## 2019-09-27 MED ORDER — ACETAMINOPHEN 325 MG PO TABS: 325.0000 mg | ORAL_TABLET | ORAL | Status: DC | PRN

## 2019-09-27 MED ORDER — SODIUM CHLORIDE 0.9 % IV SOLN: INTRAVENOUS | Status: DC

## 2019-09-27 MED ORDER — PROPOFOL 10 MG/ML IV BOLUS
INTRAVENOUS | Status: DC | PRN
  Administered 2019-09-27: 60 mg via INTRAVENOUS

## 2019-09-27 MED ORDER — ACETAMINOPHEN 160 MG/5ML PO SOLN: 1000.0000 mg | Freq: Once | ORAL | Status: DC | PRN

## 2019-09-27 SURGICAL SUPPLY — 63 items
BANDAGE ESMARK 6X9 LF (GAUZE/BANDAGES/DRESSINGS) ×1 IMPLANT
BLADE SAW RECIP 87.9 MT (BLADE) ×2 IMPLANT
BLADE SURG 10 STRL SS (BLADE) ×1 IMPLANT
BNDG CMPR 9X6 STRL LF SNTH (GAUZE/BANDAGES/DRESSINGS) ×1
BNDG COHESIVE 6X5 TAN STRL LF (GAUZE/BANDAGES/DRESSINGS) ×3 IMPLANT
BNDG ELASTIC 4X5.8 VLCR STR LF (GAUZE/BANDAGES/DRESSINGS) ×4 IMPLANT
BNDG ESMARK 6X9 LF (GAUZE/BANDAGES/DRESSINGS) ×2
BNDG GAUZE ELAST 4 BULKY (GAUZE/BANDAGES/DRESSINGS) ×3 IMPLANT
CANISTER SUCT 3000ML PPV (MISCELLANEOUS) ×2 IMPLANT
CLIP VESOCCLUDE MED 6/CT (CLIP) ×1 IMPLANT
COVER SURGICAL LIGHT HANDLE (MISCELLANEOUS) ×2 IMPLANT
COVER WAND RF STERILE (DRAPES) ×2 IMPLANT
CUFF TOURN SGL QUICK 18X4 (TOURNIQUET CUFF) IMPLANT
CUFF TOURN SGL QUICK 24 (TOURNIQUET CUFF) ×2
CUFF TOURN SGL QUICK 34 (TOURNIQUET CUFF)
CUFF TOURN SGL QUICK 42 (TOURNIQUET CUFF) IMPLANT
CUFF TRNQT CYL 24X4X16.5-23 (TOURNIQUET CUFF) IMPLANT
CUFF TRNQT CYL 34X4.125X (TOURNIQUET CUFF) IMPLANT
DRAIN CHANNEL 19F RND (DRAIN) IMPLANT
DRAPE HALF SHEET 40X57 (DRAPES) ×2 IMPLANT
DRAPE ORTHO SPLIT 77X108 STRL (DRAPES) ×4
DRAPE SURG ORHT 6 SPLT 77X108 (DRAPES) ×2 IMPLANT
DRAPE U-SHAPE 47X51 STRL (DRAPES) ×2 IMPLANT
DRSG ADAPTIC 3X8 NADH LF (GAUZE/BANDAGES/DRESSINGS) ×3 IMPLANT
ELECT CAUTERY BLADE 6.4 (BLADE) ×2 IMPLANT
ELECT REM PT RETURN 9FT ADLT (ELECTROSURGICAL) ×2
ELECTRODE REM PT RTRN 9FT ADLT (ELECTROSURGICAL) ×1 IMPLANT
EVACUATOR SILICONE 100CC (DRAIN) IMPLANT
GAUZE SPONGE 4X4 12PLY STRL (GAUZE/BANDAGES/DRESSINGS) ×3 IMPLANT
GAUZE SPONGE 4X4 12PLY STRL LF (GAUZE/BANDAGES/DRESSINGS) ×2 IMPLANT
GLOVE BIO SURGEON STRL SZ7.5 (GLOVE) ×2 IMPLANT
GLOVE BIOGEL PI IND STRL 6.5 (GLOVE) IMPLANT
GLOVE BIOGEL PI IND STRL 7.5 (GLOVE) IMPLANT
GLOVE BIOGEL PI IND STRL 8 (GLOVE) ×1 IMPLANT
GLOVE BIOGEL PI INDICATOR 6.5 (GLOVE) ×1
GLOVE BIOGEL PI INDICATOR 7.5 (GLOVE) ×2
GLOVE BIOGEL PI INDICATOR 8 (GLOVE) ×1
GLOVE ECLIPSE 6.5 STRL STRAW (GLOVE) ×2 IMPLANT
GLOVE ECLIPSE 7.0 STRL STRAW (GLOVE) ×1 IMPLANT
GOWN STRL REUS W/ TWL LRG LVL3 (GOWN DISPOSABLE) ×3 IMPLANT
GOWN STRL REUS W/TWL LRG LVL3 (GOWN DISPOSABLE) ×6
KIT BASIN OR (CUSTOM PROCEDURE TRAY) ×2 IMPLANT
KIT TURNOVER KIT B (KITS) ×2 IMPLANT
NS IRRIG 1000ML POUR BTL (IV SOLUTION) ×2 IMPLANT
PACK GENERAL/GYN (CUSTOM PROCEDURE TRAY) ×2 IMPLANT
PAD ARMBOARD 7.5X6 YLW CONV (MISCELLANEOUS) ×4 IMPLANT
RASP HELIOCORDIAL MED (MISCELLANEOUS) ×1 IMPLANT
SHEET MEDIUM DRAPE 40X70 STRL (DRAPES) ×1 IMPLANT
SPONGE LAP 18X18 X RAY DECT (DISPOSABLE) ×1 IMPLANT
STAPLER VISISTAT (STAPLE) ×3 IMPLANT
STOCKINETTE IMPERVIOUS LG (DRAPES) ×3 IMPLANT
SUT ETHILON 3 0 PS 1 (SUTURE) IMPLANT
SUT SILK 0 TIES 10X30 (SUTURE) ×2 IMPLANT
SUT SILK 2 0 (SUTURE) ×2
SUT SILK 2 0 SH CR/8 (SUTURE) ×2 IMPLANT
SUT SILK 2-0 18XBRD TIE 12 (SUTURE) ×1 IMPLANT
SUT SILK 3 0 (SUTURE) ×2
SUT SILK 3-0 18XBRD TIE 12 (SUTURE) ×1 IMPLANT
SUT VIC AB 2-0 CT1 18 (SUTURE) ×4 IMPLANT
TOWEL GREEN STERILE (TOWEL DISPOSABLE) ×4 IMPLANT
TOWEL GREEN STERILE FF (TOWEL DISPOSABLE) ×2 IMPLANT
UNDERPAD 30X30 (UNDERPADS AND DIAPERS) ×2 IMPLANT
WATER STERILE IRR 1000ML POUR (IV SOLUTION) ×2 IMPLANT

## 2019-09-27 NOTE — Interval H&P Note (Signed)
History and Physical Interval Note:  09/27/2019 8:41 AM  [name]  has presented today for surgery, with the diagnosis of wound infection.  The various methods of treatment have been discussed with the patient and family. After consideration of risks, benefits and other options for treatment, the patient has consented to  Procedure(s): AMPUTATION ABOVE KNEE (Bilateral) as a surgical intervention.  The patient's history has been reviewed, patient examined, no change in status, stable for surgery.  I have reviewed the patient's chart and labs.  Questions were answered to the patient's satisfaction.     Deitra Mayo

## 2019-09-27 NOTE — Anesthesia Preprocedure Evaluation (Addendum)
Anesthesia Evaluation  Patient identified by MRN, date of birth, ID band Patient awake    Reviewed: Allergy & Precautions, NPO status , Patient's Chart, lab work & pertinent test results  History of Anesthesia Complications Negative for: history of anesthetic complications  Airway Mallampati: III  TM Distance: >3 FB Neck ROM: Full    Dental  (+) Dental Advisory Given   Pulmonary shortness of breath,    breath sounds clear to auscultation       Cardiovascular hypertension, + Peripheral Vascular Disease   Rhythm:Regular  - Left ventricle: The cavity size was normal. Wall thickness was  increased in a pattern of moderate LVH. Systolic function was  normal. The estimated ejection fraction was in the range of 60%  to 65%. Wall motion was normal; there were no regional wall  motion abnormalities. Doppler parameters are consistent with  abnormal left ventricular relaxation (grade 1 diastolic  dysfunction).  - Aortic valve: Trileaflet; moderately thickened, mildly calcified  leaflets.  - Mitral valve: Severely calcified annulus. Moderately thickened,  severely calcified leaflets . Mobility was mildly restricted. The  findings are consistent with mild stenosis. Mean gradient (D): 6  mm Hg. Valve area by pressure half-time: 2.22 cm^2. Valve area by  continuity equation (using LVOT flow): 0.92 cm^2.   Neuro/Psych PSYCHIATRIC DISORDERS negative neurological ROS     GI/Hepatic GERD  ,  Endo/Other  diabetesHypothyroidism   Renal/GU CRFRenal disease     Musculoskeletal  (+) Arthritis ,   Abdominal   Peds  Hematology  (+) anemia ,   Anesthesia Other Findings   Reproductive/Obstetrics                            Anesthesia Physical Anesthesia Plan  ASA: III  Anesthesia Plan: General   Post-op Pain Management:    Induction: Intravenous  PONV Risk Score and Plan: 3 and  Ondansetron and Dexamethasone  Airway Management Planned: Oral ETT and LMA  Additional Equipment: None  Intra-op Plan:   Post-operative Plan: Extubation in OR  Informed Consent: I have reviewed the patients History and Physical, chart, labs and discussed the procedure including the risks, benefits and alternatives for the proposed anesthesia with the patient or authorized representative who has indicated his/her understanding and acceptance.     Dental advisory given  Plan Discussed with: CRNA and Surgeon  Anesthesia Plan Comments:         Anesthesia Quick Evaluation

## 2019-09-27 NOTE — Op Note (Signed)
    NAME: [name]    MRN: [id number] DOB: 03-31-1946    DATE OF OPERATION: 09/27/2019  PREOP DIAGNOSIS:    Gangrene bilateral lower extremities  POSTOP DIAGNOSIS:    Same  PROCEDURE:    Bilateral above-the-knee amputations  SURGEON: Judeth Cornfield. Scot Dock, MD  ASSIST: Laurence Slate, PA  ANESTHESIA: General  EBL: 100 cc  INDICATIONS:    [name] is a 74 y.o. female who presented with gangrene of both heels.  Her limbs were not salvageable and above-the-knee amputation was recommended bilaterally.  FINDINGS:   There was good bleeding at the above-knee level bilaterally with no signs of infection.  TECHNIQUE:   The patient was taken to the operating room and received a general anesthetic.  Both legs and groins were prepped and draped in usual sterile fashion.  On the left side a tourniquet was placed on the upper thigh.  A fishmouth incision was marked above the level of the patella.  The leg was exsanguinated with an Esmarch bandage and the tourniquet inflated to 300 mmHg.  Under tourniquet control, the incision was carried down through the skin, subcutaneous tissue, fascia, muscle, to the femur which was dissected free circumferentially.  The periosteum was elevated proximal to the level of skin division.  The bone was divided using a saw.  The femoral arteries and veins were individually suture ligated with 2-0 silk ties.  The tourniquet was then released.  The great saphenous vein was ligated with a 2-0 silk tie.  Additional hemostasis was obtained using electrocautery.  The wound was irrigated.  The edges of the bone were rasped.  The fascial layer was closed with interrupted 2-0 Vicryl's.  The skin was closed with staples.  Next attention was turned to the right side.  On the right side, a tourniquet was placed on the upper thigh.  A fishmouth incision was marked above the level of the patella.  The leg was exsanguinated with an Esmarch bandage and the tourniquet  inflated to 300 mmHg.  Under tourniquet control, the incision was made through the previously marked area and the dissection carried down through the subcutaneous tissue, muscle, fascia to the femur which was dissected free circumferentially.  The periosteum was elevated proximal to the level of skin division.  The bone was divided.  The femoral artery and vein were individually suture ligated with 2-0 silk ties.  The saphenous vein was ligated with a 2-0 silk tie.  The tourniquet was then released.  Additional hemostasis obtained using electrocautery.  The edges of the bone were rasped.  The wound was irrigated.  The fascial layer was closed with interrupted 2-0 Vicryl's.  The skin was closed with staples.  Sterile dressings were applied bilaterally.  The patient tolerated the procedure well was transferred to the recovery room in stable condition.  All needle and sponge counts were correct.  Deitra Mayo, MD, FACS [facility]  DATE OF DICTATION:   09/27/2019

## 2019-09-27 NOTE — Transfer of Care (Signed)
Immediate Anesthesia Transfer of Care Note  Patient: [name]  Procedure(s) Performed: AMPUTATION ABOVE KNEE (Bilateral Knee)  Patient Location: PACU  Anesthesia Type:General  Level of Consciousness: awake, alert  and oriented  Airway & Oxygen Therapy: Patient Spontanous Breathing and Patient connected to face mask oxygen  Post-op Assessment: Report given to RN and Post -op Vital signs reviewed and stable  Post vital signs: Reviewed and stable  Last Vitals:  Vitals Value Taken Time  BP 147/65 09/27/19 1109  Temp    Pulse 72 09/27/19 1112  Resp 25 09/27/19 1112  SpO2 100 % 09/27/19 1112  Vitals shown include unvalidated device data.  Last Pain:  Vitals:   09/27/19 0802  TempSrc:   PainSc: 2       Patients Stated Pain Goal: 0 (18/28/83 3744)  Complications: No apparent anesthesia complications

## 2019-09-27 NOTE — Progress Notes (Signed)
Family Medicine Teaching Service Daily Progress Note Intern Pager: [phone]  Patient name: [name] Medical record number: [id number] Date of birth: Jul 14, 1946 Age: 74 y.o. Gender: female  Primary Care Provider: [name], MD Consultants: Vascular surgery Code Status: FULL  Pt Overview and Major Events to Date:  2/4 Admitted, Vasc. surg consulted  Assessment and Plan: [name] is a 74 y.o. female presenting with concern for wound infection with bilateral lower extremity ulcerations secondary to PAD. PMH is significant for PAD, hypothyroid, hypertension, hyperlipidemia, diabetes, HFpEF, rheumatoid arthritis  Wound infection/bilateral lower extremity ulceration secondary to PAD Plan for bilateral above-knee amputation today.  Left foot x-ray showing chronic and degenerative changes without evidence of acute osseous abnormality and area of soft tissue ulceration along the plantar aspect of the proximal left foot.  Right foot x-ray showing status post amputation of the first digit toe with no acute fractures and no definitive evidence of acute osteomyelitis.  Left tibia/fibula x-ray showing diffuse soft tissue swelling without evidence of acute osseous abnormality.  Right tibia/fibula x-ray showing commuted fracture deformity involving the right calcaneus, right knee osteoarthritis and osteopenia. -Vascular surgery on board, appreciate recommendation: needs bilateral above-knee amputation on schedule for 2/8 with Dr. Bobette Mo - ID recs: Cont Zosyn 7-10 day course and repeat BCx in 48hours; will obtain repeat BCx today at 6pm.  Switched Zosyn for cefepime for continuation of total of 7 days antibiotics. -Palliative consult; awaiting recs -PT/OT: SNF, Wheelchair, supervision for mobility -Tylenol 650 every 6 hours scheduled.  Can consider oxycodone 5 mg if needed for pain. -Vitals per floor  Bacteremia Blood cultures from 2/4 showing Providencia Rettgeri.  Initially started  on Zosyn which has been switched for cefepime for total of 7 days antibiotic coverage.  Repeat blood cultures drawn 2/7 showed no growth less than 24 hours.  Patient remains afebrile. -Continue cefepime for total 7-day course of antibiotics.  Right calcaneal fracture Patient with reported significant pain in the right heel while walking.  X-ray showing a comminuted fracture involving the right calcaneus. -Pain relief as above  Right hip pain Patient states she fell few days ago and since then has had significant right hip pain. Hip x-ray w/o fracture. -Pain relief as above  Hypothyroidism  Last TSH 07/19/2019 showing 7.360. Home medications of Synthroid was increased from 125 MCG to 150 mcg at that time.  Home medications currently include Synthroid 150 MCG. -Continue home medication  Hypertension Chronic, stable. SBP range of 113-146 and DBP range of 56-74 in past 24 hours. Home medications include metolazone 5 mg twice daily and lasix 160mg  BID.  - Cont to hold home metolazone 5mg  and Lasix 160mg  BID - Cont to monitor fluid status and UOP  Hyperlipidemia Chronic, stable. Home medication include Crestor 20 mg daily.  Most recent cholesterol in November 2020 showed total cholesterol 101, HDL 47, LDL 42. -Continue home statin  Lower extremity edema/HFpEF Most recent echo 04/03/2018 showed ejection fraction 60-65% with grade 1 diastolic dysfunction.  Home medications include Lasix 160 mg twice daily,  metolazone 5 mg twice weekly. -Hold home medications  Weight loss Patient with 35 pound weight loss in the past 2.5 months.  December 2 patient weighing 140 pounds at wound care clinic, today patient weighing 115 pounds.  Possible etiology for weight loss can include nutritional deficiencies, lack of hunger drive, inaccurate scale measurements during one recording or the other, malignancy, malabsorption. -Palliative consult as above -sPEP and upep pending; concern for MM -Nutrition  consult -Recommend outpatient follow-up for etiology weight loss  Type 2 diabetes Patient not taking any home medications.  Last A1c 7.5 on 11/2017.  Blood glucose of 82 (2/8). -Continue to monitor  History of dyspnea - ?COPD vs asthma Home medications include albuterol inhaler as needed.  Per chart review it appears the patient mostly has difficulty when it is hot outside and uses albuterol inhaler approximately 1-2 times per month for this.  Patient does not have a diagnosed history of asthma or COPD per chart review.  Non-smoker. -Continue as needed albuterol  Rheumatoid arthritis Per chart review patient previously followed by rheumatology and re-referred to rheumatology in October of last year.  Patient had virtual visit with rheumatology on 07/26/2019 at which time orders were placed for rheumatologic labs.  Per that note it seems the patient was previously on methotrexate but stopped in 2013.  Patient currently on no home medications. -Continue to monitor  FEN/GI:  N.p.o. Prophylaxis: Heparin subQ  Disposition: Med-Surg, bilateral AKA pending  Subjective:  Patient without complaints this morning.  Preparing for surgery later today.  Objective: Temp:  [97.9 F (36.6 C)-98 F (36.7 C)] 97.9 F (36.6 C) (02/08 0431) Pulse Rate:  [64-67] 67 (02/08 0431) Resp:  [16-17] 16 (02/08 0431) BP: (113-146)/(56-74) 146/74 (02/08 0431) SpO2:  [99 %-100 %] 99 % (02/08 0431)   Physical Exam:  General: Alert and oriented in no apparent distress Heart: Regular rate and rhythm with no murmurs appreciated Lungs: CTA bilaterally, no wheezing Abdomen: Bowel sounds present, no abdominal pain Skin: Warm and dry Extremities: Lower limbs with some edema and multiple ulcers on bilateral feet.  Laboratory Recent Labs  Lab 09/23/19 1548 09/23/19 1548 09/23/19 1820 09/24/19 0210 09/25/19 0820  WBC 12.3*  --   --  37.7* 22.8*  HGB 11.1*   < > 12.9 9.4* 10.1*  HCT 38.4   < > 38.0 31.9*  34.3*  PLT 344  --   --  308 279   < > = values in this interval not displayed.   Recent Labs  Lab 09/23/19 1548 09/23/19 1820 09/24/19 0210 09/25/19 0820 09/26/19 0636  NA 137   < > 137 135 135  K 4.8   < > 4.4 4.1 4.1  CL 101   < > 103 106 108  CO2 26   < > 22 21* 22  BUN 33*   < > 26* 22 23  CREATININE 1.47*   < > 1.27* 1.10* 1.21*  CALCIUM 8.8*   < > 8.1* 8.0* 7.9*  PROT 9.0*  --   --   --   --   BILITOT 1.0  --   --   --   --   ALKPHOS 101  --   --   --   --   ALT 10  --   --   --   --   AST 12*  --   --   --   --   GLUCOSE 109*   < > 79 92 82   < > = values in this interval not displayed.    Imaging/Diagnostic Tests: No new imaging.  Lurline Del, DO 09/27/2019, 6:24 AM PGY-3, [place] Intern pager: [phone], text pages welcome

## 2019-09-27 NOTE — Interval H&P Note (Signed)
History and Physical Interval Note:  09/27/2019 8:43 AM  [name]  has presented today for surgery, with the diagnosis of wound infection.  The various methods of treatment have been discussed with the patient and family. After consideration of risks, benefits and other options for treatment, the patient has consented to  Procedure(s): AMPUTATION ABOVE KNEE (Bilateral) as a surgical intervention.  The patient's history has been reviewed, patient examined, no change in status, stable for surgery.  I have reviewed the patient's chart and labs.  Questions were answered to the patient's satisfaction.     Deitra Mayo

## 2019-09-27 NOTE — Anesthesia Procedure Notes (Signed)
Procedure Name: Intubation Performed by: Candis Shine, CRNA Pre-anesthesia Checklist: Patient identified, Emergency Drugs available, Suction available and Patient being monitored Patient Re-evaluated:Patient Re-evaluated prior to induction Oxygen Delivery Method: Circle System Utilized Preoxygenation: Pre-oxygenation with 100% oxygen Induction Type: IV induction Ventilation: Mask ventilation without difficulty Laryngoscope Size: Miller and 2 Grade View: Grade I Tube type: Oral Tube size: 7.0 mm Number of attempts: 1 Airway Equipment and Method: Stylet Placement Confirmation: ETT inserted through vocal cords under direct vision,  positive ETCO2 and breath sounds checked- equal and bilateral Secured at: 22 cm Tube secured with: Tape Dental Injury: Teeth and Oropharynx as per pre-operative assessment

## 2019-09-27 NOTE — Progress Notes (Signed)
PT Cancellation Note  Patient Details Name: [name] MRN: [id number] DOB: 14-Dec-1945   Cancelled Treatment:    Reason Eval/Treat Not Completed: Patient at procedure or test/unavailable per chart review, patient receiving B AKA surgery today. Will continue to follow acutely.    Windell Norfolk, DPT, PN1   Supplemental Physical Therapist [facility]    Pager [phone] Acute Rehab Office [phone]

## 2019-09-27 NOTE — Progress Notes (Signed)
Hypoglycemic Event  CBG: 59  Treatment: D50 25 mL (12.5 gm)  Symptoms: Pale and weakness  Follow-up CBG: Time: 0915 CBG Result:109  Possible Reasons for Event: Other: NPO  Comments/MD notified: Dr. Barrington Ellison, Unice Cobble

## 2019-09-28 ENCOUNTER — Encounter (HOSPITAL_COMMUNITY): Payer: Self-pay | Admitting: Family Medicine

## 2019-09-28 ENCOUNTER — Telehealth (HOSPITAL_COMMUNITY): Payer: Self-pay

## 2019-09-28 LAB — CBC
HCT: 33 % — ABNORMAL LOW (ref 36.0–46.0)
Hemoglobin: 9.6 g/dL — ABNORMAL LOW (ref 12.0–15.0)
MCH: 25.9 pg — ABNORMAL LOW (ref 26.0–34.0)
MCHC: 29.1 g/dL — ABNORMAL LOW (ref 30.0–36.0)
MCV: 88.9 fL (ref 80.0–100.0)
Platelets: 239 10*3/uL (ref 150–400)
RBC: 3.71 MIL/uL — ABNORMAL LOW (ref 3.87–5.11)
RDW: 18 % — ABNORMAL HIGH (ref 11.5–15.5)
WBC: 14.9 10*3/uL — ABNORMAL HIGH (ref 4.0–10.5)
nRBC: 0 % (ref 0.0–0.2)

## 2019-09-28 LAB — BASIC METABOLIC PANEL
Anion gap: 8 (ref 5–15)
BUN: 27 mg/dL — ABNORMAL HIGH (ref 8–23)
CO2: 22 mmol/L (ref 22–32)
Calcium: 7.8 mg/dL — ABNORMAL LOW (ref 8.9–10.3)
Chloride: 103 mmol/L (ref 98–111)
Creatinine, Ser: 1.13 mg/dL — ABNORMAL HIGH (ref 0.44–1.00)
GFR calc Af Amer: 56 mL/min — ABNORMAL LOW (ref >60)
GFR calc non Af Amer: 48 mL/min — ABNORMAL LOW (ref >60)
Glucose, Bld: 103 mg/dL — ABNORMAL HIGH (ref 70–99)
Potassium: 4.2 mmol/L (ref 3.5–5.1)
Sodium: 133 mmol/L — ABNORMAL LOW (ref 135–145)

## 2019-09-28 LAB — GLUCOSE, CAPILLARY
Glucose-Capillary: 125 mg/dL — ABNORMAL HIGH (ref 70–99)
Glucose-Capillary: 91 mg/dL (ref 70–99)
Glucose-Capillary: 81 mg/dL (ref 70–99)
Glucose-Capillary: 93 mg/dL (ref 70–99)
Glucose-Capillary: 88 mg/dL (ref 70–99)
Glucose-Capillary: 128 mg/dL — ABNORMAL HIGH (ref 70–99)

## 2019-09-28 MED ORDER — OXYCODONE HCL 5 MG PO TABS
5.0000 mg | ORAL_TABLET | ORAL | Status: DC | PRN
  Administered 2019-09-29 – 2019-09-30 (×4): 5 mg via ORAL
  Filled 2019-09-28 (×4): qty 1

## 2019-09-28 MED ORDER — OXYCODONE HCL 5 MG PO TABS
10.0000 mg | ORAL_TABLET | ORAL | Status: DC | PRN
  Administered 2019-09-28: 10 mg via ORAL
  Filled 2019-09-28: qty 2

## 2019-09-28 MED ORDER — SODIUM CHLORIDE 0.9 % IV SOLN: INTRAVENOUS | Status: AC

## 2019-09-28 MED ORDER — ENOXAPARIN SODIUM 40 MG/0.4ML ~~LOC~~ SOLN
40.0000 mg | SUBCUTANEOUS | Status: DC
  Administered 2019-09-28: 40 mg via SUBCUTANEOUS
  Filled 2019-09-28: qty 0.4

## 2019-09-28 MED ORDER — HYDROMORPHONE HCL 1 MG/ML IJ SOLN: 0.5000 mg | INTRAMUSCULAR | Status: DC | PRN

## 2019-09-28 MED ORDER — GABAPENTIN 100 MG PO CAPS
100.0000 mg | ORAL_CAPSULE | Freq: Two times a day (BID) | ORAL | Status: DC
  Administered 2019-09-28: 100 mg via ORAL
  Filled 2019-09-28: qty 1

## 2019-09-28 MED ORDER — OXYCODONE HCL 5 MG PO TABS: 5.0000 mg | ORAL_TABLET | ORAL | Status: DC | PRN

## 2019-09-28 MED ORDER — HEPARIN SODIUM (PORCINE) 5000 UNIT/ML IJ SOLN: 5000.0000 [IU] | Freq: Three times a day (TID) | INTRAMUSCULAR | Status: DC

## 2019-09-28 MED ORDER — HYDROMORPHONE HCL 1 MG/ML IJ SOLN
1.0000 mg | Freq: Once | INTRAMUSCULAR | Status: AC
  Administered 2019-09-28: 1 mg via INTRAVENOUS
  Filled 2019-09-28: qty 1

## 2019-09-28 NOTE — Progress Notes (Signed)
Inpatient Rehabilitation Admissions Coordinator  Inpatient rehab consult received. I await PT and OT evals to assist with planning dispo options.  Danne Baxter, RN, MSN Rehab Admissions Coordinator [phone] 09/28/2019 10:20 AM

## 2019-09-28 NOTE — Evaluation (Signed)
Occupational Therapy Evaluation Patient Details Name: [name] MRN: [id number] DOB: Mar 15, 1946 Today's Date: 09/28/2019    History of Present Illness B AKAs. Patient is a 74 y/o female who presents from wound center with BLE ulceration/infections with a necrotic ulcer on the left heel with foul smelling drainage and the right heel is frankly necrotic with underlying calcaneal fracture with a very foul smell at the tip of the toe appears to have bone exposed.  PMH includes Schizotypal personality disorder, HTN, HLD, DM, retinol detachment.   Clinical Impression   Pt with decline in function and safety with ADLs and ADL mobility with impaired strength, balance and endurance with cognitive impairments. Pt is a poor historian and uncertain of info provided is accurate. Pt reports that she lives at home with her son/grandson and that she was independent with bathing, dressing (sponge bathes), toileting, difficulty at time self feeding due to RA in B hands and used a rollater for mobility. Pt states that he son/grandson cooks, cleans and gets groceries. Pt currently requires max - mod A with bed mobility, pt very distracted by pain/anticipatory pain. Tangential speech at times and required redirection multiple times, mod - total A with LB ADLs,, total assist with toileting, and unable to attempt later scoots or AP transfer due to pain. Pt would benefit from acute OT services to address impairments to maximize level of function and safety     Follow Up Recommendations  CIR(will need SNF if unable to go to CIR)    Equipment Recommendations  Other (comment)(TBD at next venue of care)    Recommendations for Other Services       Precautions / Restrictions Precautions Precautions: Fall Restrictions Weight Bearing Restrictions: Yes RLE Weight Bearing: Non weight bearing LLE Weight Bearing: Non weight bearing Other Position/Activity Restrictions: B AKA      Mobility Bed Mobility Overal bed  mobility: Needs Assistance Bed Mobility: Rolling;Sidelying to Sit;Supine to Sit;Sit to Supine Rolling: Mod assist Sidelying to sit: Max assist Supine to sit: Max assist Sit to supine: Mod assist   General bed mobility comments: assist with rolling to sidelying, elevation of trunk  Transfers Overall transfer level: Needs assistance               General transfer comment: unable to attempt lateral scoots or AP due to pain    Balance                                           ADL either performed or assessed with clinical judgement   ADL Overall ADL's : Needs assistance/impaired   Eating/Feeding Details (indicate cue type and reason): pt reports that she has difficulty with self feeding at baseline due to RA in both hands, did not simulate Grooming: Wash/dry hands;Wash/dry face;Minimal assistance;Bed level Grooming Details (indicate cue type and reason): HOB raised Upper Body Bathing: Minimal assistance;Bed level   Lower Body Bathing: Moderate assistance;Bed level   Upper Body Dressing : Minimal assistance;Bed level   Lower Body Dressing: Total assistance;Bed level     Toilet Transfer Details (indicate cue type and reason): unable Toileting- Clothing Manipulation and Hygiene: Total assistance;Bed level         General ADL Comments: limited due to pain, anticipation of pain with movment. Pt unable to tolerate sitting up in bed/EOB >1 minute     Vision Patient Visual Report: No change from  baseline       Perception     Praxis      Pertinent Vitals/Pain Pain Assessment: Faces Faces Pain Scale: Hurts whole lot Pain Descriptors / Indicators: Crying;Moaning;Shooting;Sore;Aching;Burning Pain Intervention(s): Limited activity within patient's tolerance;Monitored during session;Premedicated before session;Repositioned     Hand Dominance Right   Extremity/Trunk Assessment Upper Extremity Assessment Upper Extremity Assessment: Generalized  weakness;RUE deficits/detail;LUE deficits/detail RUE Deficits / Details: RA joint deformities RUE Coordination: decreased fine motor;decreased gross motor LUE Deficits / Details: RA joint deformities LUE Coordination: decreased fine motor;decreased gross motor   Lower Extremity Assessment Lower Extremity Assessment: Defer to PT evaluation       Communication Communication Communication: No difficulties   Cognition Arousal/Alertness: Awake/alert Behavior During Therapy: WFL for tasks assessed/performed Overall Cognitive Status: Impaired/Different from baseline Area of Impairment: Memory;Following commands;Safety/judgement;Awareness                     Memory: Decreased short-term memory Following Commands: Follows one step commands with increased time Safety/Judgement: Decreased awareness of safety;Decreased awareness of deficits   Problem Solving: Decreased initiation;Requires verbal cues;Requires tactile cues General Comments: pt very distracted by pain/anticipatory pain. Tangential speech at times and required redirection multiple times   General Comments       Exercises     Shoulder Instructions      Home Living Family/patient expects to be discharged to:: Skilled nursing facility Living Arrangements: Other relatives Available Help at Discharge: Family;Available PRN/intermittently Type of Home: House Home Access: Stairs to enter [employer] of Steps: 1   Home Layout: One level     Bathroom Shower/Tub: Tub/shower unit         Home Equipment: Environmental consultant - 4 wheels;Bedside commode          Prior Functioning/Environment Level of Independence: Needs assistance  Gait / Transfers Assistance Needed: Uses rollator for ambulation ADL's / Homemaking Assistance Needed: was able to bathe and dress herself (spong bathes), difficulty feeding self at times, due to RA in hands   Comments: son/grandson cooks, cleans, get groceries        OT Problem  List: Decreased strength;Decreased range of motion;Decreased activity tolerance;Impaired balance (sitting and/or standing);Decreased safety awareness;Decreased knowledge of use of DME or AE;Decreased knowledge of precautions      OT Treatment/Interventions:      OT Goals(Current goals can be found in the care plan section) Acute Rehab OT Goals Patient Stated Goal: get well, hurt less OT Goal Formulation: With patient Time For Goal Achievement: 10/12/19 Potential to Achieve Goals: Good ADL Goals Pt Will Perform Grooming: with min guard assist;with supervision;with set-up;sitting Pt Will Perform Upper Body Bathing: with min assist;with min guard assist;sitting Pt Will Perform Lower Body Bathing: with min assist;with min guard assist;sitting/lateral leans Pt Will Perform Upper Body Dressing: with min assist;with min guard assist;sitting Pt Will Transfer to Toilet: with max assist;with mod assist;anterior/posterior transfer(lateral scoots, slide board) Additional ADL Goal #1: Pt will complete bed mobility mod - min A to sit EOB for grooming and ADL task  OT Frequency:     Barriers to D/C:            Co-evaluation              AM-PAC OT "6 Clicks" Daily Activity     Outcome Measure Help from another person eating meals?: A Lot Help from another person taking care of personal grooming?: A Little Help from another person toileting, which includes using toliet, bedpan, or urinal?: Total Help  from another person bathing (including washing, rinsing, drying)?: A Lot Help from another person to put on and taking off regular upper body clothing?: A Little Help from another person to put on and taking off regular lower body clothing?: Total 6 Click Score: 12   End of Session    Activity Tolerance: Patient limited by pain Patient left: in bed;with call bell/phone within reach;with bed alarm set  OT Visit Diagnosis: Other abnormalities of gait and mobility (R26.89);Muscle weakness  (generalized) (M62.81);Pain;Other symptoms and signs involving cognitive function Pain - Right/Left: (bilaterally) Pain - part of body: Leg(B residual limbs)                Time: 1029-1050 OT Time Calculation (min): 21 min Charges:  OT Evaluation $OT Re-eval: 1 Re-eval    Britt Bottom 09/28/2019, 1:01 PM

## 2019-09-28 NOTE — Consult Note (Signed)
PV Consult acknowledged and chart/photos reviewed in [facility].  73 y/o female admitted 09/23/19 referred by wound clinic for BLE wound infection, osteomyelitis. She is s/p bilateral AKA 09/27/19.  PMH significant for PVD, DM 2, HLD, HTN, Hypothyroid disease, R great toe amputation.   She lived at home with her grandson who cooks, cleans and runs errands. She was independent with ADL's. Since her amputation surgery, OT notes  impaired strength, balance and cognitive impairments and will require max to mod assistance to improve function and safety. She is not a candidate for CIR due to she is unable to meet requirements of intensive inpatient therapy. OT recommendation is for SNF placement.  Attempted to speak with daughter Minerva Fester by phone [phone]. No answer, left her a voicemail with contact information and call back number.  Will follow. Thank you,  Cletis Media RN BSN [place] [phone]

## 2019-09-28 NOTE — NC FL2 (Signed)
[place] MEDICAID FL2 LEVEL OF CARE SCREENING TOOL     IDENTIFICATION  Patient Name: [name] Birthdate: 1946/04/28 Sex: female Admission Date (Current Location): 09/23/2019  [facility] and [place] Number:  Herbalist and Address:  [facility], [place] [address] [place], [place] [address]      Provider Number: [id number]  Attending Physician Name and Address:  Zenia Resides, MD  Relative Name and Phone Number:  [name] [phone]    Current Level of Care: Hospital Recommended Level of Care: [place] Prior Approval Number:    Date Approved/Denied:   PASRR Number: [id number]  Discharge Plan: SNF    Current Diagnoses: Patient Active Problem List   Diagnosis Date Noted  . Wound infection   . Critical lower limb ischemia   . Moderate protein-calorie malnutrition ([place])   . Osteomyelitis ([place]) 09/23/2019  . Left breast mass 06/02/2019  . Stress 06/02/2019  . Peripheral vascular disease ([place]) 07/22/2018  . Wound of lower extremity, right, initial encounter 06/24/2018  . Dyspnea 03/23/2018  . Impaired functional mobility, balance, gait, and endurance 03/02/2018  . Abnormality of gait 02/26/2018  . Iron deficiency anemia 03/19/2017  . Vision disturbance 03/20/2015  . Memory difficulty 03/20/2015  . At high risk for falls 11/10/2012  . DIABETIC  RETINOPATHY 03/17/2007  . DIABETIC PERIPHERAL NEUROPATHY 03/17/2007  . Hypothyroidism 10/16/2006  . Type 2 diabetes mellitus ([place]) 10/16/2006  . OBESITY, NOS 10/16/2006  . HYPERTENSION, BENIGN SYSTEMIC 10/16/2006  . GASTROESOPHAGEAL REFLUX, NO ESOPHAGITIS 10/16/2006  . Rheumatoid arthritis ([place]) 10/16/2006  . OSTEOPENIA 10/16/2006  . INCONTINENCE, URGE 10/16/2006    Orientation RESPIRATION BLADDER Height & Weight     Self, Time, Place, Situation  Normal Incontinent Weight: 115 lb 8.3 oz (52.4 kg) Height:  5\' 2"  (157.5 cm)  BEHAVIORAL SYMPTOMS/MOOD NEUROLOGICAL BOWEL  NUTRITION STATUS      Incontinent Diet(see discharge summary)  AMBULATORY STATUS COMMUNICATION OF NEEDS Skin   Extensive Assist Verbally Surgical wounds, Other (Comment)(wound care: Venous stasis ulcer left and right toe heel, leg bilateral cellulitis; surgical wounds, bilateral legs, compression wrap dressing)                       Personal Care Assistance Level of Assistance  Bathing, Feeding, Dressing Bathing Assistance: Maximum assistance Feeding assistance: Limited assistance Dressing Assistance: Maximum assistance     Functional Limitations Info  Sight, Hearing, Speech Sight Info: Impaired Hearing Info: Adequate Speech Info: Adequate    SPECIAL CARE FACTORS FREQUENCY  PT (By licensed PT), OT (By licensed OT)     PT Frequency: 5x per week OT Frequency: 5x per week            Contractures Contractures Info: Not present    Additional Factors Info  Code Status, Allergies Code Status Info: Full Allergies Info: Feraheme , osiglitazone Maleate           Current Medications (09/28/2019):  This is the current hospital active medication list Current Facility-Administered Medications  Medication Dose Route Frequency Provider Last Rate Last Admin  . 0.9 %  sodium chloride infusion   Intravenous Continuous Ulyses Amor, PA-C 10 mL/hr at 09/27/19 1515 New Bag at 09/27/19 1515  . acetaminophen (TYLENOL) tablet 650 mg  650 mg Oral Q6H Ulyses Amor, PA-C   650 mg at 09/28/19 1129  . albuterol (PROVENTIL) (2.5 MG/3ML) 0.083% nebulizer solution 3 mL  3 mL Inhalation Q6H PRN Collins,  Susette Racer, PA-C      . ceFEPIme (MAXIPIME) 2 g in sodium chloride 0.9 % 100 mL IVPB  2 g Intravenous Q12H Laurence Slate M, PA-C 200 mL/hr at 09/28/19 0549 2 g at 09/28/19 0549  . docusate sodium (COLACE) capsule 100 mg  100 mg Oral Daily Laurence Slate M, PA-C   100 mg at 09/28/19 8811  . enoxaparin (LOVENOX) injection 40 mg  40 mg Subcutaneous Q24H Welborn, Ryan, DO   40 mg at 09/28/19 1130  .  feeding supplement (PRO-STAT SUGAR FREE 64) liquid 30 mL  30 mL Oral BID Laurence Slate M, PA-C   30 mL at 09/28/19 0315  . ferrous sulfate tablet 325 mg  325 mg Oral Renold Don, PA-C   325 mg at 09/28/19 0841  . gabapentin (NEURONTIN) capsule 100 mg  100 mg Oral BID Dickie La, MD   100 mg at 09/28/19 1501  . guaiFENesin-dextromethorphan (ROBITUSSIN DM) 100-10 MG/5ML syrup 15 mL  15 mL Oral Q4H PRN Ulyses Amor, PA-C      . HYDROmorphone (DILAUDID) injection 0.5-1 mg  0.5-1 mg Intravenous Q2H PRN Laurence Slate M, PA-C   1 mg at 09/28/19 1442  . HYDROmorphone (DILAUDID) injection 1 mg  1 mg Intravenous Once Dickie La, MD      . levothyroxine (SYNTHROID) tablet 150 mcg  150 mcg Oral Q0600 Ulyses Amor, PA-C   150 mcg at 09/28/19 9458  . magnesium sulfate IVPB 2 g 50 mL  2 g Intravenous Daily PRN Laurence Slate M, PA-C      . multivitamin (PROSIGHT) tablet 1 tablet  1 tablet Oral Daily Ulyses Amor, PA-C   1 tablet at 09/28/19 [phone]  . ondansetron (ZOFRAN) injection 4 mg  4 mg Intravenous Q6H PRN Ulyses Amor, PA-C   4 mg at 09/28/19 0757  . oxyCODONE (Oxy IR/ROXICODONE) immediate release tablet 10 mg  10 mg Oral Q3H PRN Dickie La, MD   10 mg at 09/28/19 1501  . pantoprazole (PROTONIX) EC tablet 40 mg  40 mg Oral Daily Laurence Slate M, PA-C   40 mg at 09/28/19 0841  . phenol (CHLORASEPTIC) mouth spray 1 spray  1 spray Mouth/Throat PRN Laurence Slate M, PA-C      . potassium chloride SA (KLOR-CON) CR tablet 20-40 mEq  20-40 mEq Oral Daily PRN Laurence Slate M, PA-C      . rosuvastatin (CRESTOR) tablet 20 mg  20 mg Oral Daily Laurence Slate M, PA-C   20 mg at 09/28/19 0840  . senna-docusate (Senokot-S) tablet 1 tablet  1 tablet Oral QHS PRN Laurence Slate M, PA-C      . sodium phosphate (FLEET) 7-19 GM/118ML enema 1 enema  1 enema Rectal Once PRN Ulyses Amor, PA-C         Discharge Medications: Please see discharge summary for a list of discharge medications.  Relevant  Imaging Results:  Relevant Lab Results:   Additional Information SS#: [id number]  Geralynn Ochs, LCSW

## 2019-09-28 NOTE — TOC Progression Note (Signed)
Transition of Care [facility]) - Progression Note    Patient Details  Name: [name] MRN: [id number] Date of Birth: Mar 06, 1946  Transition of Care [facility]) CM/SW [place], [place] Phone Number: 09/28/2019, 4:05 PM  Clinical Narrative:   CSW met with patient to discuss SNF placement, but patient was in severe pain, crying and screaming out during discussion. CSW brought up recommendation for SNF and patient said she was in agreement. Patient was distracted by talking about how she could still feel her feet and her knees even though they weren't there, did not really participate in conversation. Patient appeared anxious, brought up how she was [facility] and believed in [place]. CSW asked if patient would like someone to come by and pray with her, and patient said she would like that very much. CSW touched base with nurse on patient's pain level and to ask for a spiritual care consult for someone to come and pray with the patient. CSW to fax out referral for SNF and will follow.    Expected Discharge Plan: [place] Barriers to Discharge: Continued Medical Work up, SNF Pending bed offer  Expected Discharge Plan and Services Expected Discharge Plan: [place] arrangements for the past 2 months: Single Family Home                                       Social Determinants of Health (SDOH) Interventions    Readmission Risk Interventions No flowsheet data found.

## 2019-09-28 NOTE — Progress Notes (Addendum)
[facility]  VASCULAR SURGERY ASSESSMENT & PLAN:   POD 1 -BILATERAL ABOVE-THE-KNEE AMPUTATIONS: Her dressings have minimal drainage.  She is due for a dressing change tomorrow.   DISPOSITION: PT and CIR have been consulted.  DVT PROPHYLAXIS: She is on subcu Lovenox.  ID: She is currently on intravenous Maxipime.  From my standpoint this can be discontinued 48 hours postop.  Deitra Mayo, MD Office: [phone]   Subjective  - Comfortable without new complaints.   Objective (!) 107/59 67 98 F (36.7 C) (Oral) 18 100%  Intake/Output Summary (Last 24 hours) at 09/28/2019 0711 Last data filed at 09/28/2019 0313 Gross per 24 hour  Intake 1261.49 ml  Output 275 ml  Net 986.49 ml    B AKA dressing intact, minimal drainage on left. Alert NAD Lungs non labored breathing  Assessment/Planning: POD # 1 B AKA  Plan to change dressings tomorrow 09/29/19 PT/OT eval and consider CIR  Roxy Horseman 09/28/2019 7:11 AM --  Laboratory Lab Results: Recent Labs    09/25/19 0820 09/28/19 0222  WBC 22.8* 14.9*  HGB 10.1* 9.6*  HCT 34.3* 33.0*  PLT 279 239   BMET Recent Labs    09/26/19 0636 09/28/19 0222  NA 135 133*  K 4.1 4.2  CL 108 103  CO2 22 22  GLUCOSE 82 103*  BUN 23 27*  CREATININE 1.21* 1.13*  CALCIUM 7.9* 7.8*    COAG No results found for: INR, PROTIME No results found for: PTT

## 2019-09-28 NOTE — Progress Notes (Signed)
Chaplain engaged in initial visit with [name].  [name] expressed wanting to hear the words of prayer.  Chaplain and [name] prayed together. She shared that she has been in a lot of pain and compared that to the suffering Jesus experienced on the cross.  [name] also expressed that she still feels like her leg and feet are attached and that, that is where the pain is coming from. [name] has been holding fast to her faith to get her through this journey of being a double amputee.  She also values hymns to offer her comfort.  She expressed that even if you don't know the words of the Bible, being able to sing a song or know the words of a song can help you make it through.  (It may be helpful for prayers and hymns to be played in her hospital room). During visit, [name] kept apologizing but chaplain worked to affirm her pain and need to express that through her moaning and groaning aloud.  Chaplain worked to offer [name] support and let her know that she is seen and heard.  [name] thanked chaplain for listening to her and offering her prayer.  [name] seems to value presence.   Chaplain will have unit chaplain follow-up.

## 2019-09-28 NOTE — Progress Notes (Signed)
Inpatient Rehabilitation Admissions Coordinator  Inpatient rehab consult received. Reviewed OT eval postop. Patient not yet at the level to tolerate the intensity of an inpt rehab admit. I will follow her progress. Likely will need SNF rehab.  Danne Baxter, RN, MSN Rehab Admissions Coordinator [phone] 09/28/2019 2:31 PM

## 2019-09-28 NOTE — Progress Notes (Signed)
Unable to get 24 hour urine. Patient has pure wick and sometimes it leaks. Sometimes she has voided on bed pan with B.M. M.D. may need to consider foley to get 24 hour urine.

## 2019-09-28 NOTE — Progress Notes (Addendum)
FPTS Interim Progress Note  S: Visited [name]'s room due to increased sleepiness. She last received PRN medications 1mg  dilaudid x2 @1442  & 1643 due to worsening pain earlier today. She also received 10mg  of OxyIR @1501 . She has been very sleepy and taking shallow breaths with occasional deep breathing. Respirations improve when she opens her eyes temporarily. She responds by temporarily opening her eyes and can voice that she knows she is in the hospital. When RN proceeds to ask what for, she has already fallen asleep again.   O: BP (!) 98/52 (BP Location: Right Arm)   Pulse 74   Temp (!) 97.5 F (36.4 C) (Oral)   Resp (!) 8   Ht 5\' 2"  (1.575 m)   Wt 52.4 kg   SpO2 95%   BMI 21.13 kg/m    General: Appears very sleepy, no acute distress. Age appropriate. Lying in bed with HOB elevated.  Respiratory: normal effort, intermittently shallow breathing with some deep breaths. Neuro: Oriented but w/ decreased alertness.   A/P: -Start NS IVF @ 50mL/h -Modify OxyIR to 5mg  q3h PRN for moderate to severe pain -Modify Dilaudid to 0.5-1mg  q4h PRN for breakthrough pain -Continue to monitor vital signs -Maintain MAP of > or = 60   [name], Kari Barbee, DO 09/28/2019, 10:47 PM PGY-1, [place] Medicine Service pager [phone]

## 2019-09-28 NOTE — Progress Notes (Signed)
Checked on patient at start of PM shift, sleeping w/ HR in 80s, satting 98%  -Dr. Criss Rosales

## 2019-09-28 NOTE — Progress Notes (Addendum)
Family Medicine Teaching Service Daily Progress Note Intern Pager: [phone]  Patient name: [name] Medical record number: [id number] Date of birth: 01-Apr-1946 Age: 74 y.o. Gender: female  Primary Care Provider: [name], MD Consultants: Vascular surgery Code Status: FULL  Pt Overview and Major Events to Date:  2/4 Admitted, Vasc. surg consulted 2/8-bilateral above-knee amputation  Assessment and Plan: [name] is a 74 y.o. female presenting with concern for wound infection with bilateral lower extremity ulcerations secondary to PAD. PMH is significant for PAD, hypothyroid, hypertension, hyperlipidemia, diabetes, HFpEF, rheumatoid arthritis  Wound infection/bilateral lower extremity ulceration secondary to PAD Patient status post bilateral above-knee amputation completed 2/8.  -Vascular surgery on board, appreciate recommendations - ID recs: Cont Zosyn 7-10 day course and repeat BCx in 48hours; will obtain repeat BCx today at 6pm.  Switched Zosyn for cefepime for continuation of total of 7 days antibiotics. -Palliative consult; awaiting recs -PT/OT: SNF, Wheelchair, supervision for mobility -Tylenol 650 every 6 hours scheduled.  Can consider oxycodone 5 mg if needed for pain. -Vitals per floor  Bacteremia Blood cultures from 2/4 showing Providencia Rettgeri.  Initially started on Zosyn which has been switched for cefepime for total of 7 days antibiotic coverage.  Repeat blood cultures drawn 2/7 pending.  Patient remains afebrile.  WBC 14.9. -Continue cefepime for total 7-day course of antibiotics.  Right hip pain Patient states she fell few days ago and since then has had significant right hip pain. Hip x-ray w/o fracture. -Pain relief as above  Hypothyroidism  Last TSH 07/19/2019 showing 7.360. Home medications of Synthroid was increased from 125 MCG to 150 mcg at that time.  Home medications currently include Synthroid 150 MCG. -Continue home  medication  Hypertension Chronic, stable. SBP range of 107-128 and DBP range of 59-80 in past 24 hours. Home medications include metolazone 5 mg twice daily and lasix 160mg  BID.  - Cont to hold home metolazone 5mg  and Lasix 160mg  BID - Cont to monitor fluid status and UOP  Hyperlipidemia Chronic, stable. Home medication include Crestor 20 mg daily.  Most recent cholesterol in November 2020 showed total cholesterol 101, HDL 47, LDL 42. -Continue home statin  Lower extremity edema/HFpEF Most recent echo 04/03/2018 showed ejection fraction 60-65% with grade 1 diastolic dysfunction.  Home medications include Lasix 160 mg twice daily,  metolazone 5 mg twice weekly. -Hold home medications  Weight loss Patient with 35 pound weight loss in the past 2.5 months.  December 2 patient weighing 140 pounds at wound care clinic, today patient weighing 115 pounds.  Possible etiology for weight loss can include nutritional deficiencies, lack of hunger drive, inaccurate scale measurements during one recording or the other, malignancy, malabsorption. -Palliative consult as above -sPEP and upep pending; concern for MM -Nutrition consult -Recommend outpatient follow-up for etiology weight loss  Type 2 diabetes Patient not taking any home medications.  Last A1c 7.5 on 11/2017.  Blood glucose of 82 (2/8). -Continue to monitor  History of dyspnea - ?COPD vs asthma Home medications include albuterol inhaler as needed.  Per chart review it appears the patient mostly has difficulty when it is hot outside and uses albuterol inhaler approximately 1-2 times per month for this.  Patient does not have a diagnosed history of asthma or COPD per chart review.  Non-smoker. -Continue as needed albuterol  Rheumatoid arthritis Per chart review patient previously followed by rheumatology and re-referred to rheumatology in October of last year.  Patient had virtual visit with rheumatology on  07/26/2019 at which time orders  were placed for rheumatologic labs.  Per that note it seems the patient was previously on methotrexate but stopped in 2013.  Patient currently on no home medications. -Continue to monitor  FEN/GI:  Soft diet Prophylaxis:  Lovenox  Disposition: Med-Surg, bilateral AKA pending  Subjective:  Patient states she has some nausea this morning as well as 7 out of 10 pain.  I informed the patient that she does have medications that she can ask for for both nausea and for pain and we spoke with the nurse to initiate both of these for the patient.  Patient otherwise appears to be in good spirits, continues to be alert and oriented to person, place, time.  Objective: Temp:  [97.6 F (36.4 C)-98.6 F (37 C)] 98 F (36.7 C) (02/09 0437) Pulse Rate:  [45-85] 67 (02/09 0437) Resp:  [7-24] 18 (02/09 0437) BP: (104-147)/(56-80) 107/59 (02/09 0437) SpO2:  [88 %-100 %] 100 % (02/09 0437)   Physical Exam: General: Alert and oriented to person, place, time in no apparent distress Heart: Regular rate and rhythm with no murmurs appreciated Lungs: CTA  Abdomen: Bowel sounds present, no abdominal pain Skin: Warm and dry Extremities: Clean/dry surgical bandages present on both limbs post bilateral AKA yesterday  Laboratory Recent Labs  Lab 09/24/19 0210 09/25/19 0820 09/28/19 0222  WBC 37.7* 22.8* 14.9*  HGB 9.4* 10.1* 9.6*  HCT 31.9* 34.3* 33.0*  PLT 308 279 239   Recent Labs  Lab 09/23/19 1548 09/23/19 1820 09/25/19 0820 09/26/19 0636 09/28/19 0222  NA 137   < > 135 135 133*  K 4.8   < > 4.1 4.1 4.2  CL 101   < > 106 108 103  CO2 26   < > 21* 22 22  BUN 33*   < > 22 23 27*  CREATININE 1.47*   < > 1.10* 1.21* 1.13*  CALCIUM 8.8*   < > 8.0* 7.9* 7.8*  PROT 9.0*  --   --   --   --   BILITOT 1.0  --   --   --   --   ALKPHOS 101  --   --   --   --   ALT 10  --   --   --   --   AST 12*  --   --   --   --   GLUCOSE 109*   < > 92 82 103*   < > = values in this interval not displayed.     Imaging/Diagnostic Tests: No new imaging.  Lurline Del, DO 09/28/2019, 6:27 AM PGY-1, [place] Intern pager: [phone], text pages welcome

## 2019-09-28 NOTE — Significant Event (Signed)
Rapid Response Event Note  Overview: Called regarding lethargy and BP-89/46. Pt s/p surgery and has received multiple doses of pain medications t/o day. Per RN, MD on their way to see pt.  Time Called: 2211 Arrival Time: 2300(MD on way to see pt) Event Type: Neurologic  Initial Focused Assessment: Pt laying in bed with eyes closed. Pt will awaken very briefly and follow commands. Pt then goes back to sleep. Skin warm and dry. HR-76, BP-86/46, RR-15, SpO2-96% on RA  Pt received: 100mg  Neurotin-1501 1mg  dilaudid-1442.1635,1643 10mg  Oxy IR-0842, 1303, 1501  Interventions: Dr. Janus Molder to bedside PTA RRT and ordered: NS @ 75cc/hr and reduced dose/frequency of dilaudid and oxy IR, and to continue to monitor pt.   Plan of Care (if not transferred): Continue to monitor pt closely. Call RRT if further assistance needed.  Event Summary: Name of Physician Notified: Dr. Janus Molder at (PTA RRT)    at    Outcome: Stayed in room and stabalized  Event End Time: 2315  [place]

## 2019-09-28 NOTE — Telephone Encounter (Signed)
Left a voicemail on daughter's mobile phone with return call back phone number.

## 2019-09-28 NOTE — Progress Notes (Signed)
CALL PAGER [phone] for any questions or notifications regarding this patient  FMTS Attending Note: Dorcas Mcmurray MD Patient having significant phantom type pain. Will give dilaudid now and change frequency of oxycodone IR to q 3 hours.. Will also start 100 mg gabapentin bid. Kidney function GFR 48. Nursing aware.

## 2019-09-28 NOTE — Progress Notes (Signed)
Physical Therapy Re- Evaluation Patient Details Name: [name] MRN: [id number] DOB: Jan 03, 1946 Today's Date: 09/28/2019   History of Present Illness  Patient is a 74 y/o female who presents from wound center with BLE ulceration/infections with a necrotic ulcer on the left heel and the right heel is frankly necrotic with underlying calcaneal fracture. Patient now s/p B AKA and is NWB. PMH includes Schizotypal personality disorder, HTN, HLD, DM, retinol detachment.  Clinical Impression  Pt seen for re-evaluation this session secondary to now s/p B AKA. Pt limited today secondary to nausea and pain in B LEs. Pt willing to try sitting EOB and motivated to learn to move following amputations. Pt required mod assist for rolling in both directions, max assist for sitting<>supine with up to mod-max assist for sitting balance with episode of vomiting in sitting. Educ on precautions, positioning, therex, and importance of mobility. Pt would benefit from continued acute PT services to maximize functional mobility. Recommend CIR for intensive therapies follow up in order to progress OOB transfers to w/c, progress sitting balance and work on independence from w/c level.     Follow Up Recommendations CIR    Equipment Recommendations  Wheelchair cushion (measurements PT);Wheelchair (measurements PT)(18x18)    Recommendations for Other Services       Precautions / Restrictions Precautions Precautions: Fall Restrictions Weight Bearing Restrictions: Yes RLE Weight Bearing: Non weight bearing LLE Weight Bearing: Non weight bearing Other Position/Activity Restrictions: B AKA      Mobility  Bed Mobility Overal bed mobility: Needs Assistance Bed Mobility: Rolling;Sidelying to Sit;Supine to Sit;Sit to Supine Rolling: Mod assist Sidelying to sit: Max assist Supine to sit: Max assist Sit to supine: Max assist   General bed mobility comments: assist with rolling to sidelying, assist for elevation of  trunk an scooting hips to EOB  Transfers Overall transfer level: (unable today 2/2 nausea, pain and B AKA)               General transfer comment: unable to attempt lateral scoots or AP due to pain      Balance Overall balance assessment: Needs assistance Sitting-balance support: Feet unsupported;Bilateral upper extremity supported Sitting balance-Leahy Scale: Poor Sitting balance - Comments: pt with posterior lean in sitting requiring up to max assist to maintain sitting balance. Pt also with episode of vomitting once in sitting. Postural control: Posterior lean Standing balance support: (N/A now s/p B AKA)                                 Pertinent Vitals/Pain Pain Assessment: Faces Faces Pain Scale: Hurts even more Pain Location: B LE residual limbs, pt reports R LE more painful than L. Pain Descriptors / Indicators: Moaning;Sore;Aching;Burning Pain Intervention(s): Limited activity within patient's tolerance;Monitored during session;Premedicated before session;Repositioned    Home Living Family/patient expects to be discharged to:: Skilled nursing facility Living Arrangements: Other relatives Available Help at Discharge: Family;Available PRN/intermittently Type of Home: House Home Access: Stairs to enter   Entrance Stairs-Number of Steps: 1 Home Layout: One level Home Equipment: Walker - 4 wheels;Bedside commode      Prior Function Level of Independence: Needs assistance   Gait / Transfers Assistance Needed: Uses rollator for ambulation  ADL's / Homemaking Assistance Needed: was able to bathe and dress herself (spong bathes), difficulty feeding self at times, due to RA in hands  Comments: son/grandson cooks, cleans, get groceries     Hand Dominance  Dominant Hand: Right    Extremity/Trunk Assessment   Upper Extremity Assessment Upper Extremity Assessment: Generalized weakness;RUE deficits/detail;LUE deficits/detail RUE Deficits / Details:  RA joint deformities RUE Coordination: decreased fine motor;decreased gross motor LUE Deficits / Details: RA joint deformities LUE Coordination: decreased fine motor;decreased gross motor    Lower Extremity Assessment Lower Extremity Assessment: Defer to PT evaluation       Communication   Communication: No difficulties  Cognition Arousal/Alertness: Awake/alert Behavior During Therapy: WFL for tasks assessed/performed Overall Cognitive Status: Impaired/Different from baseline Area of Impairment: Memory;Following commands;Safety/judgement;Awareness                     Memory: Decreased short-term memory Following Commands: Follows one step commands consistently Safety/Judgement: Decreased awareness of safety;Decreased awareness of deficits Awareness: Intellectual Problem Solving: Decreased initiation;Requires verbal cues;Requires tactile cues General Comments: Pt very nauseous today and reports significant pain in B residual lower limbs. Oriented to time/place however pt with decreased awareness of deificts and talking about walking with her rollator again.      General Comments General comments (skin integrity, edema, etc.): B AKA pt with ace wraps for limb wrapping donned    Exercises Other Exercises Other Exercises: Pt performed active assisted R LE hip flexion/extension and hip abudction this session x 5, and able to perform actively without assist L LE hip flexion/extension and hip abduction x 5                PT Goals (Current goals can be found in the Care Plan section)  Acute Rehab PT Goals Patient Stated Goal: "learn to move, this if what I need" PT Goal Formulation: With patient Time For Goal Achievement: 10/12/19 Potential to Achieve Goals: Fair    Frequency Min 3X/week   Barriers to discharge        Co-evaluation               AM-PAC PT "6 Clicks" Mobility  Outcome Measure Help needed turning from your back to your side while in a flat  bed without using bedrails?: A Lot Help needed moving from lying on your back to sitting on the side of a flat bed without using bedrails?: A Lot Help needed moving to and from a bed to a chair (including a wheelchair)?: Total Help needed standing up from a chair using your arms (e.g., wheelchair or bedside chair)?: Total Help needed to walk in hospital room?: Total Help needed climbing 3-5 steps with a railing? : Total 6 Click Score: 8    End of Session   Activity Tolerance: Patient limited by pain;Other (comment)(nausea) Patient left: in bed;with call bell/phone within reach;with bed alarm set Nurse Communication: Mobility status;Precautions PT Visit Diagnosis: Pain;Difficulty in walking, not elsewhere classified (R26.2);Muscle weakness (generalized) (M62.81) Pain - Right/Left: Right Pain - part of body: Hip    Time: 0937-1000 PT Time Calculation (min) (ACUTE ONLY): 23 min   Charges:   PT Evaluation $PT Re-evaluation: 1 Re-eval          Netta Corrigan, PT, DPT, CSRS Acute Rehab Office [place] 09/28/2019, 3:19 PM

## 2019-09-29 DIAGNOSIS — D649 Anemia, unspecified: Secondary | ICD-10-CM

## 2019-09-29 LAB — GLUCOSE, CAPILLARY
Glucose-Capillary: 91 mg/dL (ref 70–99)
Glucose-Capillary: 85 mg/dL (ref 70–99)
Glucose-Capillary: 65 mg/dL — ABNORMAL LOW (ref 70–99)
Glucose-Capillary: 76 mg/dL (ref 70–99)

## 2019-09-29 LAB — CBC
HCT: 26.1 % — ABNORMAL LOW (ref 36.0–46.0)
HCT: 23.5 % — ABNORMAL LOW (ref 36.0–46.0)
Hemoglobin: 7.6 g/dL — ABNORMAL LOW (ref 12.0–15.0)
Hemoglobin: 6.8 g/dL — CL (ref 12.0–15.0)
MCH: 26.6 pg (ref 26.0–34.0)
MCH: 26.3 pg (ref 26.0–34.0)
MCHC: 29.1 g/dL — ABNORMAL LOW (ref 30.0–36.0)
MCHC: 28.9 g/dL — ABNORMAL LOW (ref 30.0–36.0)
MCV: 91.3 fL (ref 80.0–100.0)
MCV: 90.7 fL (ref 80.0–100.0)
Platelets: 257 10*3/uL (ref 150–400)
Platelets: 236 10*3/uL (ref 150–400)
RBC: 2.86 MIL/uL — ABNORMAL LOW (ref 3.87–5.11)
RBC: 2.59 MIL/uL — ABNORMAL LOW (ref 3.87–5.11)
RDW: 18 % — ABNORMAL HIGH (ref 11.5–15.5)
RDW: 18.1 % — ABNORMAL HIGH (ref 11.5–15.5)
WBC: 11.1 10*3/uL — ABNORMAL HIGH (ref 4.0–10.5)
WBC: 7.9 10*3/uL (ref 4.0–10.5)
nRBC: 0 % (ref 0.0–0.2)
nRBC: 0 % (ref 0.0–0.2)

## 2019-09-29 LAB — BASIC METABOLIC PANEL
Anion gap: 6 (ref 5–15)
BUN: 34 mg/dL — ABNORMAL HIGH (ref 8–23)
CO2: 22 mmol/L (ref 22–32)
Calcium: 8 mg/dL — ABNORMAL LOW (ref 8.9–10.3)
Chloride: 107 mmol/L (ref 98–111)
Creatinine, Ser: 1.42 mg/dL — ABNORMAL HIGH (ref 0.44–1.00)
GFR calc Af Amer: 42 mL/min — ABNORMAL LOW (ref >60)
GFR calc non Af Amer: 37 mL/min — ABNORMAL LOW (ref >60)
Glucose, Bld: 112 mg/dL — ABNORMAL HIGH (ref 70–99)
Potassium: 4.3 mmol/L (ref 3.5–5.1)
Sodium: 135 mmol/L (ref 135–145)

## 2019-09-29 LAB — PREPARE RBC (CROSSMATCH)

## 2019-09-29 LAB — SURGICAL PATHOLOGY

## 2019-09-29 LAB — ABO/RH: ABO/RH(D): A POS

## 2019-09-29 MED ORDER — SODIUM CHLORIDE 0.9 % IV SOLN: INTRAVENOUS | Status: DC

## 2019-09-29 MED ORDER — ENOXAPARIN SODIUM 30 MG/0.3ML ~~LOC~~ SOLN
30.0000 mg | SUBCUTANEOUS | Status: DC
  Administered 2019-09-29: 30 mg via SUBCUTANEOUS
  Filled 2019-09-29: qty 0.3

## 2019-09-29 MED ORDER — SODIUM CHLORIDE 0.9% IV SOLUTION: Freq: Once | INTRAVENOUS | Status: DC

## 2019-09-29 MED ORDER — GABAPENTIN 100 MG PO CAPS
100.0000 mg | ORAL_CAPSULE | Freq: Three times a day (TID) | ORAL | Status: DC
  Administered 2019-09-29 – 2019-10-02 (×7): 100 mg via ORAL
  Filled 2019-09-29 (×7): qty 1

## 2019-09-29 NOTE — TOC Progression Note (Signed)
Transition of Care [facility]) - Progression Note    Patient Details  Name: [name] MRN: [id number] Date of Birth: 25-Mar-1946  Transition of Care [facility]) CM/SW [place], [place] Phone Number: 09/29/2019, 3:11 PM  Clinical Narrative:     Called left voice message with patient;s daughter to return call.  Thurmond Butts, MSW, [place] Clinical Social Worker   Expected Discharge Plan: Skilled Nursing Facility Barriers to Discharge: Continued Medical Work up, SNF Pending bed offer  Expected Discharge Plan and Services Expected Discharge Plan: [place] arrangements for the past 2 months: Single Family Home                                       Social Determinants of Health (SDOH) Interventions    Readmission Risk Interventions No flowsheet data found.

## 2019-09-29 NOTE — Progress Notes (Signed)
Patient remains very sleepy will open eyes and closes them and will not talk much . Speech is difficult to understand at times. Will not take anything by mouth. B.P. running on low side and resp. Also low resp. Even and unlab. Sleep apnea resp. 6-14 Strong congested non productive cough.Sats 100 % S.R. 76.. No urine output . called Mindy R.N. to discuss the above. Will call M.D. on call and let him. Dr. Criss Rosales on call and made aware. Of the above and will come see patient.

## 2019-09-29 NOTE — Care Management Important Message (Signed)
Important Message  Patient Details  Name: [name] MRN: [id number] Date of Birth: 11-01-1945   Medicare Important Message Given:  Yes     Shelda Altes 09/29/2019, 2:07 PM

## 2019-09-29 NOTE — Progress Notes (Addendum)
Family Medicine Teaching Service Daily Progress Note Intern Pager: [phone]  Patient name: [name] Medical record number: [id number] Date of birth: October 09, 1945 Age: 74 y.o. Gender: female  Primary Care Provider: [name], MD Consultants: Vascular surgery Code Status: FULL  Pt Overview and Major Events to Date:  2/4 Admitted, Vasc. surg consulted 2/8-bilateral above-knee amputation  Assessment and Plan: [name] is a 74 y.o. female presenting with concern for wound infection with bilateral lower extremity ulcerations secondary to PAD. PMH is significant for PAD, hypothyroid, hypertension, hyperlipidemia, diabetes, HFpEF, rheumatoid arthritis  Wound infection/bilateral lower extremity ulceration s/p bilateral above-knee amputation Patient status post bilateral above-knee amputation completed 2/8.  -Vascular surgery on board, appreciate recommendations - ID recs: Switched Zosyn for cefepime for continuation of total of 7 days antibiotics. -Palliative consult; awaiting recs -PT/OT: SNF, Wheelchair, supervision for mobility -Tylenol 650 every 6 hours scheduled. Oxycodone 5 mg every 3 hours for moderate pain, Dilaudid 0.5-1 mg IV every 4 hours as needed for breakthrough.. -Vitals per floor  Bacteremia Blood cultures from 2/4 showing Providencia Rettgeri.  Initially started on Zosyn which has been switched for cefepime for total of 7 days antibiotic coverage.  Repeat blood cultures drawn 2/7 pending.  Patient remains afebrile.  WBC improved at 11.1.  Per vascular surgery can discontinue after today's last dose with no fevers. -Continue cefepime through 2/10  Right hip pain Patient states she fell few days ago and since then has had significant right hip pain. Hip x-ray w/o fracture. -Pain relief as above  Hypothyroidism  Last TSH 07/19/2019 showing 7.360. Home medications of Synthroid was increased from 125 MCG to 150 mcg at that time.  Home medications currently  include Synthroid 150 MCG. -Continue home medication  Hypertension Chronic, stable. SBP range of 88-119 and DBP range of 52-61 in past 24 hours. Home medications include metolazone 5 mg twice daily and lasix 160mg  BID.  - Cont to hold home metolazone 5mg  and Lasix 160mg  BID - Cont to monitor fluid status and UOP  Hyperlipidemia Chronic, stable. Home medication include Crestor 20 mg daily.  Most recent cholesterol in November 2020 showed total cholesterol 101, HDL 47, LDL 42. -Continue home statin  Lower extremity edema/HFpEF Most recent echo 04/03/2018 showed ejection fraction 60-65% with grade 1 diastolic dysfunction.  Home medications include Lasix 160 mg twice daily,  metolazone 5 mg twice weekly. -Hold home medications  Weight loss Patient with 35 pound weight loss in the past 2.5 months.  December 2 patient weighing 140 pounds at wound care clinic, today patient weighing 115 pounds.  Possible etiology for weight loss can include nutritional deficiencies, lack of hunger drive, inaccurate scale measurements during one recording or the other, malignancy, malabsorption.  SPEP pattern reflects a polyclonal increase in gamma globulin which can be found in wide variety of infectious/noninfectious/autoimmune disease states. -Palliative consult as above -Upep pending; concern for MM, can be completed in the outpatient setting if needed -Nutrition consult -Recommend outpatient follow-up for etiology weight loss  Type 2 diabetes Patient not taking any home medications.  Last A1c 7.5 on 11/2017.  Blood glucose of 112. -Continue to monitor  History of dyspnea - ?COPD vs asthma Home medications include albuterol inhaler as needed.  Per chart review it appears the patient mostly has difficulty when it is hot outside and uses albuterol inhaler approximately 1-2 times per month for this.  Patient does not have a diagnosed history of asthma or COPD per chart review.  Non-smoker. -Continue  as  needed albuterol  Rheumatoid arthritis Per chart review patient previously followed by rheumatology and re-referred to rheumatology in October of last year.  Patient had virtual visit with rheumatology on 07/26/2019 at which time orders were placed for rheumatologic labs.  Per that note it seems the patient was previously on methotrexate but stopped in 2013.  Patient currently on no home medications. -Continue to monitor  Post rounds addendum: We will recheck hemoglobin this afternoon.  Should hemoglobin still be between 7 and 8 we will give 1 unit packed red blood cells.  Also increasing gabapentin to 100 3 times daily.  FEN/GI:  Soft diet Prophylaxis:  Lovenox  Disposition: Med-Surg, bilateral AKA pending  Subjective:  Patient states she is still having some pain from after her surgery.  She states she is having a little bit of diarrhea as well.   Objective: Temp:  [97.4 F (36.3 C)-98.1 F (36.7 C)] 97.4 F (36.3 C) (02/10 0401) Pulse Rate:  [65-88] 67 (02/10 0401) Resp:  [0-20] 9 (02/10 0401) BP: (86-127)/(46-90) 119/61 (02/10 0401) SpO2:  [94 %-100 %] 100 % (02/10 0401)   Physical Exam: General: Alert and oriented in no apparent distress Heart: Regular rate and rhythm with no murmurs appreciated Lungs: CTA bilaterally Abdomen: Bowel sounds present, no abdominal pain Skin: Warm and dry Extremities: Clean/dry surgical bandages present on both lower limbs   Laboratory Recent Labs  Lab 09/25/19 0820 09/28/19 0222 09/29/19 0301  WBC 22.8* 14.9* 11.1*  HGB 10.1* 9.6* 7.6*  HCT 34.3* 33.0* 26.1*  PLT 279 239 257   Recent Labs  Lab 09/23/19 1548 09/23/19 1820 09/26/19 0636 09/28/19 0222 09/29/19 0301  NA 137   < > 135 133* 135  K 4.8   < > 4.1 4.2 4.3  CL 101   < > 108 103 107  CO2 26   < > 22 22 22   BUN 33*   < > 23 27* 34*  CREATININE 1.47*   < > 1.21* 1.13* 1.42*  CALCIUM 8.8*   < > 7.9* 7.8* 8.0*  PROT 9.0*  --   --   --   --   BILITOT 1.0  --   --   --    --   ALKPHOS 101  --   --   --   --   ALT 10  --   --   --   --   AST 12*  --   --   --   --   GLUCOSE 109*   < > 82 103* 112*   < > = values in this interval not displayed.    Imaging/Diagnostic Tests: No new imaging.  Lurline Del, DO 09/29/2019, 6:18 AM PGY-1, [place] Intern pager: [phone], text pages welcome

## 2019-09-29 NOTE — Progress Notes (Signed)
   VASCULAR SURGERY ASSESSMENT & PLAN:   POD 2 -BILATERAL ABOVE-THE-KNEE AMPUTATIONS: I changed her dressing this morning.  Both AKA's look fine.  DISPOSITION: PT and CIR following.  DVT PROPHYLAXIS: She is on subcu Lovenox.  ID: She is currently on intravenous Maxipime.  From my standpoint this can be discontinued after today's last dose.   SUBJECTIVE:   Complains of some nausea this morning.  PHYSICAL EXAM:   Vitals:   09/29/19 0200 09/29/19 0300 09/29/19 0400 09/29/19 0401  BP: (!) 96/57 (!) 98/59 119/61 119/61  Pulse:  66 66 67  Resp: 10 12 12  (!) 9  Temp:    (!) 97.4 F (36.3 C)  TempSrc:    Oral  SpO2: 100% 100% 100% 100%  Weight:      Height:       I changed the dressing on both of her AKA's and these look fine.  There was a small amount of bloody drainage on the right.  LABS:   Lab Results  Component Value Date   WBC 11.1 (H) 09/29/2019   HGB 7.6 (L) 09/29/2019   HCT 26.1 (L) 09/29/2019   MCV 91.3 09/29/2019   PLT 257 09/29/2019   Lab Results  Component Value Date   CREATININE 1.42 (H) 09/29/2019    CBG (last 3)  Recent Labs    09/28/19 1639 09/28/19 2126 09/29/19 0622  GLUCAP 88 128* 91    PROBLEM LIST:    Active Problems:   Peripheral vascular disease (HCC)   Osteomyelitis (HCC)   Wound infection   Critical lower limb ischemia   Moderate protein-calorie malnutrition (HCC)   CURRENT MEDS:   . acetaminophen  650 mg Oral Q6H  . docusate sodium  100 mg Oral Daily  . enoxaparin (LOVENOX) injection  30 mg Subcutaneous Q24H  . feeding supplement (PRO-STAT SUGAR FREE 64)  30 mL Oral BID  . ferrous sulfate  325 mg Oral QODAY  . gabapentin  100 mg Oral BID  . levothyroxine  150 mcg Oral Q0600  . multivitamin  1 tablet Oral Daily  . pantoprazole  40 mg Oral Daily  . rosuvastatin  20 mg Oral Daily    Deitra Mayo Office: [phone] 09/29/2019

## 2019-09-29 NOTE — Progress Notes (Signed)
Pharmacy Antibiotic Note  [name] is a 74 y.o. female with PMH of PAD, hypothyroid, hypertension, hyperlipidemia, diabetes, HFpEF, rheumatoid arthritis who was admitted on 09/23/2019 with concern for wound infection with bilateral lower extremity ulcerations secondary to PAD.  Blood cultures from 2/4 grew providencia rettgeri. Repeat blood cultures have no growth to date. Due to reports of inducible amp-c beta lactamase resistance with the use of Zosyn and ceftriaxone with this organism, therapy was switched to cefepime per discussion with Dr. Garlan Fillers. Pharmacy has been consulted to dose cefepime.  WBC has improved from 33.7 to 11.1. Pt remains afebrile and vital signs are stable. She is s/p bilateral AKA on 2/8 with good source control. Renal function acutely worsened since yesterday with a near 30% increase in Scr near the level she was admitted with. Calculating her CrCl without using weight d/t recent bAKA, CrCl = 14mL/min. GFR =42. Since calculated CrCl remains 30-28mL/min, will continue current dosing regimen.  Plan: Continue Cefepime 2g IV q12h Plan is to treat for 7 days; End date added for 2/11   Height: 5\' 2"  (157.5 cm) Weight: 115 lb 8.3 oz (52.4 kg) IBW/kg (Calculated) : 50.1  Temp (24hrs), Avg:97.7 F (36.5 C), Min:97.4 F (36.3 C), Max:98.1 F (36.7 C)  Recent Labs  Lab 09/23/19 1548 09/23/19 1615 09/23/19 1749 09/23/19 1820 09/24/19 0210 09/25/19 0820 09/26/19 0636 09/28/19 0222 09/29/19 0301  WBC 12.3*  --   --   --  37.7* 22.8*  --  14.9* 11.1*  CREATININE 1.47*  --   --    < > 1.27* 1.10* 1.21* 1.13* 1.42*  LATICACIDVEN  --  1.6 2.1*  --   --   --   --   --   --    < > = values in this interval not displayed.    Estimated Creatinine Clearance: 27.9 mL/min (A) (by C-G formula based on SCr of 1.42 mg/dL (H)).    Allergies  Allergen Reactions  . Feraheme [Ferumoxytol] Itching  . Rosiglitazone Maleate Other (See Comments)    REACTION: Difficulty walking,  Fatigue, shortness of breath    Antimicrobials this admission: Zosyn 2/4>>2/7 Cefepime 2/7 >>  Microbiology results: 2/7 Repeat Bcx: no growth to date 2/7 MRSA PCR: negative 2/4 BCx >> Providencia Rettgeri (R to amp and cefazolin) 2/4 COVID/Influenza: negative  Thank you for allowing pharmacy to be a part of this patient's care.  Kennon Holter, PharmD PGY1 Ambulatory Care Pharmacy Resident 09/29/2019 6:38 AM

## 2019-09-30 ENCOUNTER — Telehealth (HOSPITAL_COMMUNITY): Payer: Self-pay

## 2019-09-30 DIAGNOSIS — R7881 Bacteremia: Secondary | ICD-10-CM

## 2019-09-30 LAB — TYPE AND SCREEN
ABO/RH(D): A POS
Antibody Screen: NEGATIVE
Unit division: 0
Unit division: 0

## 2019-09-30 LAB — CBC
HCT: 32.5 % — ABNORMAL LOW (ref 36.0–46.0)
Hemoglobin: 10.2 g/dL — ABNORMAL LOW (ref 12.0–15.0)
MCH: 27.8 pg (ref 26.0–34.0)
MCHC: 31.4 g/dL (ref 30.0–36.0)
MCV: 88.6 fL (ref 80.0–100.0)
Platelets: 187 10*3/uL (ref 150–400)
RBC: 3.67 MIL/uL — ABNORMAL LOW (ref 3.87–5.11)
RDW: 15.8 % — ABNORMAL HIGH (ref 11.5–15.5)
WBC: 9.8 10*3/uL (ref 4.0–10.5)
nRBC: 0 % (ref 0.0–0.2)

## 2019-09-30 LAB — BASIC METABOLIC PANEL
Anion gap: 4 — ABNORMAL LOW (ref 5–15)
BUN: 26 mg/dL — ABNORMAL HIGH (ref 8–23)
CO2: 20 mmol/L — ABNORMAL LOW (ref 22–32)
Calcium: 7.8 mg/dL — ABNORMAL LOW (ref 8.9–10.3)
Chloride: 113 mmol/L — ABNORMAL HIGH (ref 98–111)
Creatinine, Ser: 1.19 mg/dL — ABNORMAL HIGH (ref 0.44–1.00)
GFR calc Af Amer: 52 mL/min — ABNORMAL LOW (ref >60)
GFR calc non Af Amer: 45 mL/min — ABNORMAL LOW (ref >60)
Glucose, Bld: 75 mg/dL (ref 70–99)
Potassium: 4.2 mmol/L (ref 3.5–5.1)
Sodium: 137 mmol/L (ref 135–145)

## 2019-09-30 LAB — BPAM RBC
Blood Product Expiration Date: 202103102359
Blood Product Expiration Date: 202103102359
ISSUE DATE / TIME: 202102102019
ISSUE DATE / TIME: 202102102227
Unit Type and Rh: 6200
Unit Type and Rh: 6200

## 2019-09-30 LAB — GLUCOSE, CAPILLARY
Glucose-Capillary: 80 mg/dL (ref 70–99)
Glucose-Capillary: 136 mg/dL — ABNORMAL HIGH (ref 70–99)
Glucose-Capillary: 135 mg/dL — ABNORMAL HIGH (ref 70–99)

## 2019-09-30 MED ORDER — ENOXAPARIN SODIUM 300 MG/3ML IJ SOLN
20.0000 mg | INTRAMUSCULAR | Status: DC
  Administered 2019-09-30 – 2019-10-02 (×3): 20 mg via SUBCUTANEOUS
  Filled 2019-09-30 (×3): qty 0.2

## 2019-09-30 MED ORDER — ENOXAPARIN SODIUM 30 MG/0.3ML ~~LOC~~ SOLN: 30.0000 mg | SUBCUTANEOUS | Status: DC

## 2019-09-30 MED ORDER — ENSURE ENLIVE PO LIQD
237.0000 mL | Freq: Three times a day (TID) | ORAL | Status: DC
  Administered 2019-09-30 – 2019-10-01 (×2): 237 mL via ORAL

## 2019-09-30 MED ORDER — OXYCODONE HCL 5 MG PO TABS: 5.0000 mg | ORAL_TABLET | Freq: Four times a day (QID) | ORAL | Status: DC | PRN

## 2019-09-30 MED ORDER — ADULT MULTIVITAMIN W/MINERALS CH
1.0000 | ORAL_TABLET | Freq: Every day | ORAL | Status: DC
  Administered 2019-10-01 – 2019-10-02 (×2): 1 via ORAL
  Filled 2019-09-30 (×2): qty 1

## 2019-09-30 NOTE — TOC Progression Note (Signed)
Transition of Care [facility]) - Progression Note    Patient Details  Name: [name] MRN: [id number] Date of Birth: May 14, 1946  Transition of Care [facility]) CM/SW [place], [place] Phone Number: 09/30/2019, 1:22 PM  Clinical Narrative:     CSW provided patient's daughter with SNF bed offers. She will review and contact CSW with choice.  Thurmond Butts, MSW, [place] Clinical Social Worker   Expected Discharge Plan: Skilled Nursing Facility Barriers to Discharge: Continued Medical Work up, SNF Pending bed offer  Expected Discharge Plan and Services Expected Discharge Plan: [place] arrangements for the past 2 months: Single Family Home                                       Social Determinants of Health (SDOH) Interventions    Readmission Risk Interventions No flowsheet data found.

## 2019-09-30 NOTE — Progress Notes (Signed)
Physical Therapy Treatment Patient Details Name: [name] MRN: [id number] DOB: 03-14-1946 Today's Date: 09/30/2019    History of Present Illness Patient is a 74 y/o female admitted from wound center on 09/23/19 with BLE ulceration/infections, L heel necrotic ulcer, R heel necrotic ulcer with underlying calcaneal fx. Patient now s/p B AKA on 09/27/19. PMH includes schizotypal personality disorder, HTN, HLD, DM, retinal detachment.   PT Comments    Pt slowly progressing with mobility. Able to transfer to recliner, requiring maxA+2 to perform lateral scoot transfer. Pt with poor trunk control and UE strength, requiring maxA to maintain sitting balance. Pt pleasant and agreeable, although confused with poor attention, requiring frequent redirection to task and conversation. If pt continues to demonstrate improvements in activity tolerance, may benefit from intensive CIR-level therapies. If not, will require SNF.    Follow Up Recommendations  CIR;Supervision/Assistance - 24 hour(vs. SNF pending tolerance)     Equipment Recommendations  Wheelchair cushion (measurements PT);Wheelchair (measurements PT)(18x18)    Recommendations for Other Services       Precautions / Restrictions Precautions Precautions: Fall Restrictions Weight Bearing Restrictions: Yes RLE Weight Bearing: Non weight bearing LLE Weight Bearing: Non weight bearing Other Position/Activity Restrictions: Bilateral AKA    Mobility  Bed Mobility Overal bed mobility: Needs Assistance Bed Mobility: Supine to Sit     Supine to sit: Max assist;HOB elevated;+2 for safety/equipment     General bed mobility comments: MaxA to assist trunk elevation and scoot hips to EOB, pt able to assist with BUEs on rail support with cues  Transfers Overall transfer level: Needs assistance Equipment used: 1 person hand held assist Transfers: Lateral/Scoot Transfers          Lateral/Scoot Transfers: Max assist;+2 physical assistance;+2  safety/equipment General transfer comment: Lateral scoot transfer towards R-side to drop arm recliner; pt fearful of falling requiring max encouragement and cues for sequencing; maxA+2 to scoot with use of bed pad, pt minimally assisting with UEs when cued.  Ambulation/Gait                 Stairs             Wheelchair Mobility    Modified Rankin (Stroke Patients Only)       Balance Overall balance assessment: Needs assistance Sitting-balance support: Feet unsupported;Bilateral upper extremity supported Sitting balance-Leahy Scale: Poor Sitting balance - Comments: Able to briefly hold static sitting balance with forward flexed posture and BUE support on EOB, easily fatigued, ultimately requiring maxA for sitting balance due to posterior lean; fearful of falling Postural control: Posterior lean                                  Cognition Arousal/Alertness: Awake/alert Behavior During Therapy: WFL for tasks assessed/performed;Anxious Overall Cognitive Status: Impaired/Different from baseline Area of Impairment: Attention;Memory;Following commands;Safety/judgement;Awareness;Problem solving;Orientation                 Orientation Level: Disoriented to;Situation Current Attention Level: Sustained;Selective Memory: Decreased short-term memory;Decreased recall of precautions Following Commands: Follows one step commands with increased time Safety/Judgement: Decreased awareness of safety;Decreased awareness of deficits Awareness: Intellectual Problem Solving: Decreased initiation;Requires verbal cues;Requires tactile cues General Comments: Anxious requiring fearful of falling. Tangential with speech with some emotional lability, requiring frequent redirection to task      Exercises Other Exercises Other Exercises: Reps of anterior weight translation sitting EOB and in recliner with BUE support to pull, able  to hold <5 sec before leaning back to  rest    General Comments General comments (skin integrity, edema, etc.): RN and nursing student present to observe transfer; educ on recommendation for transfers with nursing staff (dependent lateral scoot with pad/blanket from reclined chair if needed due to fatigue)      Pertinent Vitals/Pain Pain Assessment: Faces Faces Pain Scale: Hurts a little bit Pain Location: Bilateral residual limbs Pain Descriptors / Indicators: Guarding;Discomfort Pain Intervention(s): Monitored during session;Other (comment)(educ on phantom limb pain and techniques to overcome)    Home Living                      Prior Function            PT Goals (current goals can now be found in the care plan section) Progress towards PT goals: Progressing toward goals    Frequency    Min 3X/week      PT Plan Current plan remains appropriate    Co-evaluation              AM-PAC PT "6 Clicks" Mobility   Outcome Measure  Help needed turning from your back to your side while in a flat bed without using bedrails?: A Lot Help needed moving from lying on your back to sitting on the side of a flat bed without using bedrails?: A Lot Help needed moving to and from a bed to a chair (including a wheelchair)?: Total Help needed standing up from a chair using your arms (e.g., wheelchair or bedside chair)?: Total Help needed to walk in hospital room?: Total Help needed climbing 3-5 steps with a railing? : Total 6 Click Score: 8    End of Session   Activity Tolerance: Patient tolerated treatment well;Patient limited by fatigue Patient left: in chair;with call bell/phone within reach;with chair alarm set;with nursing/sitter in room Nurse Communication: Mobility status PT Visit Diagnosis: Pain;Difficulty in walking, not elsewhere classified (R26.2);Muscle weakness (generalized) (M62.81) Pain - Right/Left: Right Pain - part of body: Hip     Time: 5573-2202 PT Time Calculation (min) (ACUTE ONLY):  24 min  Charges:  $Therapeutic Activity: 23-37 mins                     Mabeline Caras, PT, DPT Acute Rehabilitation Services  Pager [phone] Office [place] 09/30/2019, 12:46 PM

## 2019-09-30 NOTE — Progress Notes (Signed)
Nutrition Follow up  DOCUMENTATION CODES:   Not applicable  INTERVENTION:    Ensure Enlive po TID, each supplement provides 350 kcal and 20 grams of protein  Magic cup BID with meals, each supplement provides 290 kcal and 9 grams of protein  MVI daily   NUTRITION DIAGNOSIS:   Increased nutrient needs related to wound healing as evidenced by estimated needs.  Ongoing  GOAL:   Patient will meet greater than or equal to 90% of their needs  Progressing  MONITOR:   PO intake, Weight trends, Supplement acceptance, I & O's, Labs, Skin, Diet advancement  REASON FOR ASSESSMENT:   Consult Other (Comment)(recent wt loss)  ASSESSMENT:  RD working remotely.   74 year old female with past medical history of PAD, hypothyroidism, HTN, HLD, DM, HFpEF, and RA presenting with concerns of wound infection and reports 35 lb unintentional wt loss in the past 2.5 months. Patient admitted with bilateral severe leg ischemia secondary to PAD   2/8- s/p bilateral AKAs  Pt reports appetite is off/on. Meal completions charted as 10-25% for her last three meals. RD observed oatmeal with a couple bites taken. Pt endorses having swallowing issues with drier foods. Currently on GI soft diet. RD to change to [facility] for preferred texture. Discussed the importance of protein intake for preservation of lean body mass and to promote post-op healing. Pt loves Ensure and wants them to be sent.    Admission weight: 52.4 kg- need recent weight  Medications: colace, ferrous sulfate  Labs: CBG 65-91  Diet Order:   Diet Order            DIET SOFT Room service appropriate? No; Fluid consistency: Thin  Diet effective now              EDUCATION NEEDS:   No education needs have been identified at this time  Skin:  Skin Assessment: Skin Integrity Issues: Skin Integrity Issues:: Incisions Unstageable: n/a Diabetic Ulcer: n/a Incisions: s/p bilateral AKAs Other: n/a  Last BM:  2/9  Height:   Ht  Readings from Last 1 Encounters:  09/24/19 5\' 2"  (1.575 m)    Weight:   Wt Readings from Last 1 Encounters:  09/23/19 52.4 kg    Ideal Body Weight:  42 kg(adjusted bilateral AKA)  BMI:  Body mass index is 21.13 kg/m.  Estimated Nutritional Needs:   Kcal:  1600-1800  Protein:  80-90  Fluid:  1.2 L/day per MD  Mariana Single RD, LDN Clinical Nutrition Pager listed in [place]

## 2019-09-30 NOTE — Progress Notes (Signed)
Inpatient Rehabilitation Admissions Coordinator  We will sign off at this time. Recommend SNF.  Danne Baxter, RN, MSN Rehab Admissions Coordinator [phone] 09/30/2019 2:30 PM

## 2019-09-30 NOTE — Progress Notes (Signed)
  Progress Note    09/30/2019 8:51 AM 3 Days Post-Op  Subjective:  Doing well this morning. States she is "great as great can be" Eating breakfast when I entered the room. Denies any nausea this morning   Vitals:   09/29/19 2355 09/30/19 0638  BP: (!) 136/59 138/68  Pulse: 73 74  Resp: 14 (!) 9  Temp: 98.1 F (36.7 C) 98.4 F (36.9 C)  SpO2: 100% 97%   Physical Exam: General: well appearing, not in any acute discomfort Lungs:  Non labored Incisions:  Right aka stump intact, clean and dry. No active drainage. Mildly edematous. No obvious fluid collections. Mildly tender. Left aka stump intact, clean and dry. No active bleeding or drainage. No fluid collections. Non tender Extremities:  Left femoral pulse not palpable, right femoral pulse 1+ Abdomen:  Soft, non tender Neurologic: Alert and oriented  CBC    Component Value Date/Time   WBC 9.8 09/30/2019 0251   RBC 3.67 (L) 09/30/2019 0251   HGB 10.2 (L) 09/30/2019 0251   HGB WILL FOLLOW 06/02/2019 1343   HCT 32.5 (L) 09/30/2019 0251   HCT WILL FOLLOW 06/02/2019 1343   PLT 187 09/30/2019 0251   PLT WILL FOLLOW 06/02/2019 1343   MCV 88.6 09/30/2019 0251   MCV WILL FOLLOW 06/02/2019 1343   MCH 27.8 09/30/2019 0251   MCHC 31.4 09/30/2019 0251   RDW 15.8 (H) 09/30/2019 0251   RDW WILL FOLLOW 06/02/2019 1343   LYMPHSABS 1.1 09/23/2019 1548   LYMPHSABS WILL FOLLOW 06/02/2019 1343   MONOABS 0.5 09/23/2019 1548   EOSABS 0.0 09/23/2019 1548   EOSABS WILL FOLLOW 06/02/2019 1343   BASOSABS 0.0 09/23/2019 1548   BASOSABS WILL FOLLOW 06/02/2019 1343    BMET    Component Value Date/Time   NA 137 09/30/2019 0251   NA 136 06/24/2018 1230   K 4.2 09/30/2019 0251   CL 113 (H) 09/30/2019 0251   CO2 20 (L) 09/30/2019 0251   GLUCOSE 75 09/30/2019 0251   BUN 26 (H) 09/30/2019 0251   BUN 34 (H) 06/24/2018 1230   CREATININE 1.19 (H) 09/30/2019 0251   CREATININE 0.82 07/05/2014 1606   CALCIUM 7.8 (L) 09/30/2019 0251   GFRNONAA  45 (L) 09/30/2019 0251   GFRAA 52 (L) 09/30/2019 0251    INR No results found for: INR   Intake/Output Summary (Last 24 hours) at 09/30/2019 0851 Last data filed at 09/30/2019 0600 Gross per 24 hour  Intake 2261.36 ml  Output --  Net 2261.36 ml     Assessment/Plan:  74 y.o. female is s/p bilateral above knee amputations 3 Days Post-Op. Healing well. Continue PT. Pending d/c to SNF when medically optimized  DVT prophylaxis: SQ lovenox   Kari Caldwell, PA-C [facility] [phone] 09/30/2019 8:51 AM

## 2019-09-30 NOTE — Progress Notes (Addendum)
Family Medicine Teaching Service Daily Progress Note Intern Pager: [phone]  Patient name: [name] Medical record number: [id number] Date of birth: 1946-02-26 Age: 74 y.o. Gender: female  Primary Care Provider: [name], MD Consultants: Vascular surgery Code Status: FULL  Pt Overview and Major Events to Date:  2/4 Admitted, Vasc. surg consulted 2/8-bilateral above-knee amputation  Assessment and Plan: [name] is a 74 y.o. female presenting with concern for wound infection with bilateral lower extremity ulcerations secondary to PAD. PMH is significant for PAD, hypothyroid, hypertension, hyperlipidemia, diabetes, HFpEF, rheumatoid arthritis  Wound infection/bilateral lower extremity ulceration s/p bilateral above-knee amputation Patient status post bilateral above-knee amputation completed 2/8.  Patient status post Zosyn and cefepime. -Vascular surgery on board, appreciate recommendations -Palliative consult; awaiting recs -PT/OT: SNF, Wheelchair, supervision for mobility -Tylenol 650 every 6 hours scheduled. Oxycodone 5 mg every 3 hours for moderate pain, Dilaudid 0.5-1 mg IV every 4 hours as needed for breakthrough.. -Vitals per floor -Gabapentin 100 mg every 8 hours for phantom leg pain -Bladder scans every shift -Delirium precautions  Anemia Patient's hemoglobin trended down from 9.6-6.8 from 2/9-2/10.  Patient status post 2 units packed red blood cells.  Current hemoglobin 10.2. -Continue to monitor daily CBC  Bacteremia Blood cultures from 2/4 showing Providencia Rettgeri.  Initially started on Zosyn which has been switched for cefepime for total of 7 days antibiotic coverage.  Repeat blood cultures drawn 2/7 showed no growth at 3 days.  Patient remains afebrile.  WBC improved at 9.8.  Antibiotics completed yesterday, 2/10  Right hip pain Patient states she fell few days ago and since then has had significant right hip pain. Hip x-ray w/o  fracture. -Pain relief as above  Hypothyroidism  Last TSH 07/19/2019 showing 7.360. Home medications of Synthroid was increased from 125 MCG to 150 mcg at that time.  Home medications currently include Synthroid 150 MCG. -Continue home medication  Hypertension Chronic, stable. SBP range of 131-142 and DBP range of 59-66 in past 24 hours. Home medications include metolazone 5 mg twice daily and lasix 160mg  BID.  - Cont to hold home metolazone 5mg  and Lasix 160mg  BID - Cont to monitor fluid status and UOP  Hyperlipidemia Chronic, stable. Home medication include Crestor 20 mg daily.  Most recent cholesterol in November 2020 showed total cholesterol 101, HDL 47, LDL 42. -Continue home statin  HFpEF Most recent echo 04/03/2018 showed ejection fraction 60-65% with grade 1 diastolic dysfunction.  Home medications include Lasix 160 mg twice daily,  metolazone 5 mg twice weekly. -Hold home medications  Weight loss Patient with 35 pound weight loss in the past 2.5 months.  December 2 patient weighing 140 pounds at wound care clinic, today patient weighing 115 pounds.  Possible etiology for weight loss can include nutritional deficiencies, lack of hunger drive, inaccurate scale measurements during one recording or the other, malignancy, malabsorption.  SPEP pattern reflects a polyclonal increase in gamma globulin which can be found in wide variety of infectious/noninfectious/autoimmune disease states. -Palliative consult as above -Upep pending; concern for MM, can be completed in the outpatient setting if needed -Nutrition consult -Recommend outpatient follow-up for etiology weight loss  Type 2 diabetes Patient not taking any home medications.  Last A1c 7.5 on 11/2017.  Blood glucose of 75. -Continue to monitor  History of dyspnea - ?COPD vs asthma Home medications include albuterol inhaler as needed.  Per chart review it appears the patient mostly has difficulty when it is hot outside and  uses albuterol inhaler approximately 1-2 times per month for this.  Patient does not have a diagnosed history of asthma or COPD per chart review.  Non-smoker. -Continue as needed albuterol  Rheumatoid arthritis Per chart review patient previously followed by rheumatology and re-referred to rheumatology in October of last year.  Patient had virtual visit with rheumatology on 07/26/2019 at which time orders were placed for rheumatologic labs.  Per that note it seems the patient was previously on methotrexate but stopped in 2013.  Patient currently on no home medications. -Continue to monitor  FEN/GI:  Soft diet Prophylaxis:  Lovenox  Disposition: Med-Surg, bilateral AKA pending  Subjective:  Patient continues with phantom leg pain greater in the right than the left.  She denies other complaints at this time.  Objective: Temp:  [97.7 F (36.5 C)-98.7 F (37.1 C)] 98.1 F (36.7 C) (02/10 2355) Pulse Rate:  [73-82] 73 (02/10 2355) Resp:  [11-21] 14 (02/10 2355) BP: (107-142)/(54-66) 136/59 (02/10 2355) SpO2:  [100 %] 100 % (02/10 2355)   Physical Exam: General: Alert and oriented in no apparent distress Heart: S1, S2 with no murmurs appreciated Lungs: CTA bilaterally, no wheezing Abdomen: Bowel sounds present, no abdominal pain Skin: Warm and dry Extremities: Lower limb bandages dry    Laboratory Recent Labs  Lab 09/29/19 0301 09/29/19 1604 09/30/19 0251  WBC 11.1* 7.9 9.8  HGB 7.6* 6.8* 10.2*  HCT 26.1* 23.5* 32.5*  PLT 257 236 187   Recent Labs  Lab 09/23/19 1548 09/23/19 1820 09/28/19 0222 09/29/19 0301 09/30/19 0251  NA 137   < > 133* 135 137  K 4.8   < > 4.2 4.3 4.2  CL 101   < > 103 107 113*  CO2 26   < > 22 22 20*  BUN 33*   < > 27* 34* 26*  CREATININE 1.47*   < > 1.13* 1.42* 1.19*  CALCIUM 8.8*   < > 7.8* 8.0* 7.8*  PROT 9.0*  --   --   --   --   BILITOT 1.0  --   --   --   --   ALKPHOS 101  --   --   --   --   ALT 10  --   --   --   --   AST 12*  --    --   --   --   GLUCOSE 109*   < > 103* 112* 75   < > = values in this interval not displayed.    Imaging/Diagnostic Tests: No new imaging.  Lurline Del, DO 09/30/2019, 6:21 AM PGY-1, [place] Intern pager: [phone], text pages welcome

## 2019-09-30 NOTE — Progress Notes (Signed)
   VASCULAR SURGERY ASSESSMENT & PLAN:   POD 3 -BILATERAL ABOVE-THE-KNEE AMPUTATIONS: Her dressings are dry.  I have ordered daily dressing changes to begin today.  DISPOSITION: PT and CIR following.  DVT PROPHYLAXIS: She is on subcu Lovenox.  SUBJECTIVE:   No specific complaints.  PHYSICAL EXAM:   Vitals:   09/29/19 2252 09/29/19 2300 09/29/19 2355 09/30/19 0638  BP: (!) 142/66 135/66 (!) 136/59 138/68  Pulse: 78 75 73 74  Resp: 16 18 14  (!) 9  Temp: 98.6 F (37 C)  98.1 F (36.7 C) 98.4 F (36.9 C)  TempSrc:   Oral   SpO2:  100% 100% 97%  Weight:      Height:       The dressings on both AKA's are dry.  LABS:   Lab Results  Component Value Date   WBC 9.8 09/30/2019   HGB 10.2 (L) 09/30/2019   HCT 32.5 (L) 09/30/2019   MCV 88.6 09/30/2019   PLT 187 09/30/2019   Lab Results  Component Value Date   CREATININE 1.19 (H) 09/30/2019   No results found for: INR, PROTIME CBG (last 3)  Recent Labs    09/29/19 1624 09/29/19 1745 09/30/19 0635  GLUCAP 65* 76 80    PROBLEM LIST:    Active Problems:   Peripheral vascular disease (HCC)   Osteomyelitis (HCC)   Wound infection   Critical lower limb ischemia   Moderate protein-calorie malnutrition (HCC)   CURRENT MEDS:   . sodium chloride   Intravenous Once  . acetaminophen  650 mg Oral Q6H  . docusate sodium  100 mg Oral Daily  . enoxaparin (LOVENOX) injection  30 mg Subcutaneous Q24H  . feeding supplement (PRO-STAT SUGAR FREE 64)  30 mL Oral BID  . ferrous sulfate  325 mg Oral QODAY  . gabapentin  100 mg Oral Q8H  . levothyroxine  150 mcg Oral Q0600  . multivitamin  1 tablet Oral Daily  . pantoprazole  40 mg Oral Daily  . rosuvastatin  20 mg Oral Daily    Deitra Mayo Office: [phone] 09/30/2019

## 2019-09-30 NOTE — Progress Notes (Signed)
Bladder scan done per q shift order, pt with 429ml. Pt with no discomfort or distention. MD paged, continue to monitor of discomfort and repeat in a few hours. Will continue to monitor.  Amanda Cockayne, RN

## 2019-09-30 NOTE — Anesthesia Postprocedure Evaluation (Signed)
Anesthesia Post Note  Patient: [name]  Procedure(s) Performed: AMPUTATION ABOVE KNEE (Bilateral Knee)     Patient location during evaluation: PACU Anesthesia Type: General Level of consciousness: awake and patient cooperative Pain management: pain level controlled Vital Signs Assessment: post-procedure vital signs reviewed and stable Respiratory status: spontaneous breathing, nonlabored ventilation, respiratory function stable and patient connected to nasal cannula oxygen Cardiovascular status: blood pressure returned to baseline and stable Postop Assessment: no apparent nausea or vomiting Anesthetic complications: no    Last Vitals:  Vitals:   09/30/19 0638 09/30/19 1000  BP: 138/68   Pulse: 74 72  Resp: (!) 9 13  Temp: 36.9 C   SpO2: 97% 100%    Last Pain:  Vitals:   09/30/19 1000  TempSrc:   PainSc: 0-No pain                 Carleton [place]

## 2019-09-30 NOTE — Telephone Encounter (Signed)
Call received from daughter- Mariel Aloe. Able to answer questions regarding patient's current plan of care per [facility] notes. Daughter states that they are willing for patient to discharge to a SNF if that is the recommendation of rehab team. Told daughter that I would document a note for team to read. No other questions voiced at this time. Daughter has contact information should questions arise.  Cletis Media RN BSN CWS [place]

## 2019-10-01 DIAGNOSIS — E785 Hyperlipidemia, unspecified: Secondary | ICD-10-CM

## 2019-10-01 DIAGNOSIS — Z89611 Acquired absence of right leg above knee: Secondary | ICD-10-CM

## 2019-10-01 DIAGNOSIS — Z89612 Acquired absence of left leg above knee: Secondary | ICD-10-CM

## 2019-10-01 LAB — BASIC METABOLIC PANEL
Anion gap: 6 (ref 5–15)
BUN: 26 mg/dL — ABNORMAL HIGH (ref 8–23)
CO2: 19 mmol/L — ABNORMAL LOW (ref 22–32)
Calcium: 7.6 mg/dL — ABNORMAL LOW (ref 8.9–10.3)
Chloride: 110 mmol/L (ref 98–111)
Creatinine, Ser: 1.07 mg/dL — ABNORMAL HIGH (ref 0.44–1.00)
GFR calc Af Amer: 60 mL/min — ABNORMAL LOW (ref >60)
GFR calc non Af Amer: 51 mL/min — ABNORMAL LOW (ref >60)
Glucose, Bld: 89 mg/dL (ref 70–99)
Potassium: 4.3 mmol/L (ref 3.5–5.1)
Sodium: 135 mmol/L (ref 135–145)

## 2019-10-01 LAB — CULTURE, BLOOD (ROUTINE X 2)
Culture: NO GROWTH
Culture: NO GROWTH
Special Requests: ADEQUATE

## 2019-10-01 LAB — CBC
HCT: 33.8 % — ABNORMAL LOW (ref 36.0–46.0)
Hemoglobin: 10.5 g/dL — ABNORMAL LOW (ref 12.0–15.0)
MCH: 27.8 pg (ref 26.0–34.0)
MCHC: 31.1 g/dL (ref 30.0–36.0)
MCV: 89.4 fL (ref 80.0–100.0)
Platelets: 247 10*3/uL (ref 150–400)
RBC: 3.78 MIL/uL — ABNORMAL LOW (ref 3.87–5.11)
RDW: 16.1 % — ABNORMAL HIGH (ref 11.5–15.5)
WBC: 12.5 10*3/uL — ABNORMAL HIGH (ref 4.0–10.5)
nRBC: 0 % (ref 0.0–0.2)

## 2019-10-01 LAB — GLUCOSE, CAPILLARY
Glucose-Capillary: 115 mg/dL — ABNORMAL HIGH (ref 70–99)
Glucose-Capillary: 143 mg/dL — ABNORMAL HIGH (ref 70–99)
Glucose-Capillary: 193 mg/dL — ABNORMAL HIGH (ref 70–99)
Glucose-Capillary: 145 mg/dL — ABNORMAL HIGH (ref 70–99)

## 2019-10-01 LAB — SARS CORONAVIRUS 2 (TAT 6-24 HRS): SARS Coronavirus 2: NEGATIVE

## 2019-10-01 MED ORDER — ONDANSETRON HCL 4 MG PO TABS
4.0000 mg | ORAL_TABLET | Freq: Once | ORAL | Status: DC
  Filled 2019-10-01: qty 1

## 2019-10-01 MED ORDER — FUROSEMIDE 40 MG PO TABS
40.0000 mg | ORAL_TABLET | Freq: Every day | ORAL | Status: DC
  Administered 2019-10-01 – 2019-10-02 (×2): 40 mg via ORAL
  Filled 2019-10-01 (×2): qty 1

## 2019-10-01 NOTE — Progress Notes (Addendum)
  Progress Note  VASCULAR SURGERY ASSESSMENT & PLAN:   POD 4 - BILAT AKA's: Both AKA's are healing nicely.   Awaiting SNF bed.  I will check back Monday. If any problems over the weekend, Dr. Carlis Abbott is on call.   Deitra Mayo, MD Office: [phone]   10/01/2019 7:54 AM 4 Days Post-Op  Subjective:  Doing well over night. Some pain in Right aka    Vitals:   09/30/19 2047 10/01/19 0424  BP: (!) 125/98 (!) 153/66  Pulse: 68 72  Resp: 16 15  Temp: 98 F (36.7 C) 98.2 F (36.8 C)  SpO2: 100% 100%   Physical Exam: General: pleasant, not in any acute discomfort Lungs:  Non labored Incisions:  Stumps are clean and intact. very minimal bleeding from medial staple line of left aka stump. No active drainage. No erythema, necrosis of staple line, or fluid collections. Right aka tender with movement. New dressings re applied Extremities:  2+ bilateral femoral pulses Abdomen:  Soft, non tender Neurologic: alert and oriented  CBC    Component Value Date/Time   WBC 12.5 (H) 10/01/2019 0210   RBC 3.78 (L) 10/01/2019 0210   HGB 10.5 (L) 10/01/2019 0210   HGB WILL FOLLOW 06/02/2019 1343   HCT 33.8 (L) 10/01/2019 0210   HCT WILL FOLLOW 06/02/2019 1343   PLT 247 10/01/2019 0210   PLT WILL FOLLOW 06/02/2019 1343   MCV 89.4 10/01/2019 0210   MCV WILL FOLLOW 06/02/2019 1343   MCH 27.8 10/01/2019 0210   MCHC 31.1 10/01/2019 0210   RDW 16.1 (H) 10/01/2019 0210   RDW WILL FOLLOW 06/02/2019 1343   LYMPHSABS 1.1 09/23/2019 1548   LYMPHSABS WILL FOLLOW 06/02/2019 1343   MONOABS 0.5 09/23/2019 1548   EOSABS 0.0 09/23/2019 1548   EOSABS WILL FOLLOW 06/02/2019 1343   BASOSABS 0.0 09/23/2019 1548   BASOSABS WILL FOLLOW 06/02/2019 1343    BMET    Component Value Date/Time   NA 135 10/01/2019 0210   NA 136 06/24/2018 1230   K 4.3 10/01/2019 0210   CL 110 10/01/2019 0210   CO2 19 (L) 10/01/2019 0210   GLUCOSE 89 10/01/2019 0210   BUN 26 (H) 10/01/2019 0210   BUN 34 (H)  06/24/2018 1230   CREATININE 1.07 (H) 10/01/2019 0210   CREATININE 0.82 07/05/2014 1606   CALCIUM 7.6 (L) 10/01/2019 0210   GFRNONAA 51 (L) 10/01/2019 0210   GFRAA 60 (L) 10/01/2019 0210    INR No results found for: INR   Intake/Output Summary (Last 24 hours) at 10/01/2019 0754 Last data filed at 09/30/2019 1500 Gross per 24 hour  Intake 560 ml  Output --  Net 560 ml     Assessment/Plan:  74 y.o. female is s/p bilateral above knee amputations 4 Days Post-Op. Doing well post op. Continue daily dressing changes.Continue PT. CIR recommending SNF  DVT prophylaxis: SQ lovenox   Karoline Caldwell, PA-C [facility] [phone] 10/01/2019 7:54 AM

## 2019-10-01 NOTE — TOC Progression Note (Signed)
Transition of Care [facility]) - Progression Note    Patient Details  Name: [name] MRN: [id number] Date of Birth: 18-Oct-1945  Transition [facility] CM/SW [place], [place] Phone Number: 10/01/2019, 11:05 AM  Clinical Narrative:     Patient's daughter decided on [place] for  ST rehab at [facility] [place] has been updated.  CSW updated RN and requested covid. Patient can discharge over the weekend to [facility] once covid test results arte back.  Thurmond Butts, MSW, [place] Clinical Social Worker    Expected Discharge Plan: Skilled Nursing Facility Barriers to Discharge: Continued Medical Work up, SNF Pending bed offer  Expected Discharge Plan and Services Expected Discharge Plan: [place] arrangements for the past 2 months: Single Family Home                                       Social Determinants of Health (SDOH) Interventions    Readmission Risk Interventions No flowsheet data found.

## 2019-10-01 NOTE — Progress Notes (Addendum)
Family Medicine Teaching Service Daily Progress Note Intern Pager: [phone]  Patient name: [name] Medical record number: [id number] Date of birth: 01/06/1946 Age: 74 y.o. Gender: female  Primary Care Provider: [name], MD Consultants: Vascular surgery Code Status: FULL  Pt Overview and Major Events to Date:  2/4 Admitted, Vasc. surg consulted 2/8-bilateral above-knee amputation  Assessment and Plan: Alexandr JAKITA DUTKIEWICZ is a 74 y.o. female presenting with concern for wound infection with bilateral lower extremity ulcerations secondary to PAD. PMH is significant for PAD, hypothyroid, hypertension, hyperlipidemia, diabetes, HFpEF, rheumatoid arthritis  Wound infection/bilateral lower extremity ulceration s/p bilateral above-knee amputation Patient status post bilateral above-knee amputation completed 2/8.  Patient status post Zosyn and cefepime. -Vascular surgery on board, appreciate recommendations -Palliative consult; appreciate recs -PT/OT: SNF, Wheelchair, supervision for mobility -Tylenol 650 every 6 hours scheduled. Oxycodone 5 mg every 3 hours for moderate pain PRN -Vitals per floor -Gabapentin 100 mg every 8 hours for phantom leg pain -Bladder scans every shift -Delirium precautions -Patient now stable for discharge to SNF  Anemia-resolved Current hemoglobin 10.5.   -Continue to monitor daily CBC  Bacteremia-resolved Blood cultures from 2/4 showing Providencia Rettgeri.  Initially started on Zosyn which has been switched for cefepime for total of 7 days antibiotic coverage.  Repeat blood cultures drawn 2/7 showed no growth at 3 days.  Patient remains afebrile.  WBC improved at 9.8.  Antibiotics completed 2/10  Right hip pain Patient states she fell few days ago and since then has had significant right hip pain. Hip x-ray w/o fracture. -Pain relief as above  Hypothyroidism  Last TSH 07/19/2019 showing 7.360. Home medications of Synthroid was increased from  125 MCG to 150 mcg at that time.  Home medications currently include Synthroid 150 MCG. -Continue home medication  Hypertension Chronic, stable.  Most recent blood pressure 153/66.  SBP range of 109-153 and DBP range of 48-98 in past 24 hours. Home medications include metolazone 5 mg twice daily and lasix 160mg  BID.  - Cont to hold home metolazone 5mg  and Lasix 160mg  BID - Cont to monitor fluid status and UOP  Hyperlipidemia Chronic, stable. Home medication include Crestor 20 mg daily.  Most recent cholesterol in November 2020 showed total cholesterol 101, HDL 47, LDL 42. -Continue home statin  HFpEF Most recent echo 04/03/2018 showed ejection fraction 60-65% with grade 1 diastolic dysfunction.  Home medications include Lasix 160 mg twice daily,  metolazone 5 mg twice weekly. -Restart Lasix at 40, one time per day  Weight loss Patient with 35 pound weight loss in the past 2.5 months.  December 2 patient weighing 140 pounds at wound care clinic, today patient weighing 115 pounds.  Possible etiology for weight loss can include nutritional deficiencies, lack of hunger drive, inaccurate scale measurements during one recording or the other, malignancy, malabsorption.  SPEP pattern reflects a polyclonal increase in gamma globulin which can be found in wide variety of infectious/noninfectious/autoimmune disease states. -Palliative consult as above -Upep pending; concern for MM, can be completed in the outpatient setting if needed -Nutrition consult -Recommend outpatient follow-up for etiology weight loss  Type 2 diabetes Patient not taking any home medications.  Last A1c 7.5 on 11/2017.  Blood glucose of 89. -Continue to monitor  History of dyspnea - ?COPD vs asthma Home medications include albuterol inhaler as needed.  Per chart review it appears the patient mostly has difficulty when it is hot outside and uses albuterol inhaler approximately 1-2 times per month for this.  Patient does not  have a diagnosed history of asthma or COPD per chart review.  Non-smoker. -Continue as needed albuterol  Rheumatoid arthritis Per chart review patient previously followed by rheumatology and re-referred to rheumatology in October of last year.  Patient had virtual visit with rheumatology on 07/26/2019 at which time orders were placed for rheumatologic labs.  Per that note it seems the patient was previously on methotrexate but stopped in 2013.  Patient currently on no home medications. -Continue to monitor  FEN/GI: Soft diet Prophylaxis:  Lovenox  Disposition: Stable for discharge to SNF  Subjective:  Patient alert and oriented to person, place, time.  States her pain is somewhat better today.  Denies other complaints at this time.  Objective: Temp:  [97.3 F (36.3 C)-98.7 F (37.1 C)] 98.2 F (36.8 C) (02/12 0424) Pulse Rate:  [67-74] 72 (02/12 0424) Resp:  [9-16] 15 (02/12 0424) BP: (109-153)/(48-98) 153/66 (02/12 0424) SpO2:  [97 %-100 %] 100 % (02/12 0424) Weight:  [49.1 kg] 49.1 kg (02/11 1648)   Physical Exam: General: Alert and oriented person, place, time and in no apparent distress. Heart: Regular rate and rhythm with no murmurs appreciated Lungs: CTA bilaterally, no wheezing Abdomen: Bowel sounds present, no abdominal pain Skin: Warm and dry Extremities: Bandages clean and dry from prior bilateral AKA's   Laboratory Recent Labs  Lab 09/29/19 1604 09/30/19 0251 10/01/19 0210  WBC 7.9 9.8 12.5*  HGB 6.8* 10.2* 10.5*  HCT 23.5* 32.5* 33.8*  PLT 236 187 247   Recent Labs  Lab 09/29/19 0301 09/30/19 0251 10/01/19 0210  NA 135 137 135  K 4.3 4.2 4.3  CL 107 113* 110  CO2 22 20* 19*  BUN 34* 26* 26*  CREATININE 1.42* 1.19* 1.07*  CALCIUM 8.0* 7.8* 7.6*  GLUCOSE 112* 75 89    Imaging/Diagnostic Tests: No new imaging.  Lurline Del, DO 10/01/2019, 6:10 AM PGY-1, [place] Intern pager: [phone], text pages welcome

## 2019-10-01 NOTE — Progress Notes (Signed)
Physical Therapy Treatment Patient Details Name: [name] MRN: [id number] DOB: Aug 12, 1946 Today's Date: 10/01/2019    History of Present Illness Patient is a 74 y/o female admitted from wound center on 09/23/19 with BLE ulceration/infections, L heel necrotic ulcer, R heel necrotic ulcer with underlying calcaneal fx. Patient now s/p B AKA on 09/27/19. PMH includes schizotypal personality disorder, HTN, HLD, DM, retinal detachment.    PT Comments    Patient progressing slowly towards PT goals. Requires Max A to come into long sitting and to pivot LEs to prepare for AP transfer. Tolerated AP transfer with Max A of 2 with cues for sequencing to assist with BUEs. Requires multiple scoots to get into chair. Pt continues to demonstrate cognitive deficits relating to attention, problem solving, awareness, and safety. Instructed pt in there ex for LEs. Discharge recommendation updated to SNF as pt denied CIR. Will follow.   Follow Up Recommendations  Supervision/Assistance - 24 hour;SNF     Equipment Recommendations  Wheelchair cushion (measurements PT);Wheelchair (measurements PT)(18x18)    Recommendations for Other Services       Precautions / Restrictions Precautions Precautions: Fall Restrictions Weight Bearing Restrictions: Yes RLE Weight Bearing: Non weight bearing LLE Weight Bearing: Non weight bearing Other Position/Activity Restrictions: Bilateral AKA    Mobility  Bed Mobility Overal bed mobility: Needs Assistance Bed Mobility: Supine to Sit     Supine to sit: HOB elevated;Max assist     General bed mobility comments: Able to come into long sitting with HOB elevated with Max A and cues to reach for rails, able to initiate moving BLEs to prepare for AP transfer.  Transfers Overall transfer level: Needs assistance Equipment used: None Transfers: Comptroller transfers: Max assist;+2 physical assistance   General transfer  comment: AP transfer into recliner with pt assisting with mini scoots using UEs and PT helping with pad. Cues for sequencing and technique.  Ambulation/Gait                 Stairs             Wheelchair Mobility    Modified Rankin (Stroke Patients Only)       Balance Overall balance assessment: Needs assistance Sitting-balance support: Bilateral upper extremity supported;Feet unsupported Sitting balance-Leahy Scale: Zero Sitting balance - Comments: Requires Mod-Max A for static sitting balance in bed, posterior bias/lean. Postural control: Posterior lean                                  Cognition Arousal/Alertness: Awake/alert Behavior During Therapy: WFL for tasks assessed/performed Overall Cognitive Status: Impaired/Different from baseline Area of Impairment: Attention;Memory;Following commands;Safety/judgement;Awareness;Problem solving;Orientation                 Orientation Level: Disoriented to;Situation Current Attention Level: Sustained;Selective Memory: Decreased short-term memory;Decreased recall of precautions Following Commands: Follows one step commands with increased time Safety/Judgement: Decreased awareness of safety;Decreased awareness of deficits Awareness: Intellectual Problem Solving: Decreased initiation;Requires verbal cues;Requires tactile cues General Comments: Tangential with speech, needs frequent cues for redirection and to attend to task.      Exercises Amputee Exercises Hip Extension: Both;5 reps;Seated Hip Flexion/Marching: Both;5 reps;Seated    General Comments General comments (skin integrity, edema, etc.): VSS.      Pertinent Vitals/Pain Faces Pain Scale: No hurt    Home Living  Prior Function            PT Goals (current goals can now be found in the care plan section) Progress towards PT goals: Progressing toward goals    Frequency    Min 3X/week      PT  Plan Discharge plan needs to be updated    Co-evaluation              AM-PAC PT "6 Clicks" Mobility   Outcome Measure  Help needed turning from your back to your side while in a flat bed without using bedrails?: A Lot Help needed moving from lying on your back to sitting on the side of a flat bed without using bedrails?: A Lot Help needed moving to and from a bed to a chair (including a wheelchair)?: Total Help needed standing up from a chair using your arms (e.g., wheelchair or bedside chair)?: Total Help needed to walk in hospital room?: Total Help needed climbing 3-5 steps with a railing? : Total 6 Click Score: 8    End of Session   Activity Tolerance: Patient tolerated treatment well Patient left: in chair;with call bell/phone within reach;with chair alarm set Nurse Communication: Mobility status PT Visit Diagnosis: Difficulty in walking, not elsewhere classified (R26.2);Muscle weakness (generalized) (M62.81)     Time: 9924-2683 PT Time Calculation (min) (ACUTE ONLY): 21 min  Charges:  $Therapeutic Activity: 8-22 mins                     Marisa Severin, PT, DPT Acute Rehabilitation Services Pager [phone] Office [phone]       [name] 10/01/2019, 10:01 AM

## 2019-10-02 DIAGNOSIS — R7881 Bacteremia: Secondary | ICD-10-CM | POA: Diagnosis not present

## 2019-10-02 DIAGNOSIS — N189 Chronic kidney disease, unspecified: Secondary | ICD-10-CM | POA: Diagnosis not present

## 2019-10-02 DIAGNOSIS — L97119 Non-pressure chronic ulcer of right thigh with unspecified severity: Secondary | ICD-10-CM | POA: Diagnosis not present

## 2019-10-02 DIAGNOSIS — E1142 Type 2 diabetes mellitus with diabetic polyneuropathy: Secondary | ICD-10-CM | POA: Diagnosis not present

## 2019-10-02 DIAGNOSIS — D509 Iron deficiency anemia, unspecified: Secondary | ICD-10-CM | POA: Diagnosis not present

## 2019-10-02 DIAGNOSIS — E440 Moderate protein-calorie malnutrition: Secondary | ICD-10-CM | POA: Diagnosis not present

## 2019-10-02 DIAGNOSIS — D62 Acute posthemorrhagic anemia: Secondary | ICD-10-CM

## 2019-10-02 DIAGNOSIS — I998 Other disorder of circulatory system: Secondary | ICD-10-CM | POA: Diagnosis not present

## 2019-10-02 DIAGNOSIS — R5381 Other malaise: Secondary | ICD-10-CM | POA: Diagnosis not present

## 2019-10-02 DIAGNOSIS — L89153 Pressure ulcer of sacral region, stage 3: Secondary | ICD-10-CM | POA: Diagnosis not present

## 2019-10-02 DIAGNOSIS — I739 Peripheral vascular disease, unspecified: Secondary | ICD-10-CM | POA: Diagnosis not present

## 2019-10-02 DIAGNOSIS — Z4781 Encounter for orthopedic aftercare following surgical amputation: Secondary | ICD-10-CM | POA: Diagnosis not present

## 2019-10-02 DIAGNOSIS — E039 Hypothyroidism, unspecified: Secondary | ICD-10-CM | POA: Diagnosis not present

## 2019-10-02 DIAGNOSIS — E785 Hyperlipidemia, unspecified: Secondary | ICD-10-CM | POA: Diagnosis not present

## 2019-10-02 DIAGNOSIS — M869 Osteomyelitis, unspecified: Secondary | ICD-10-CM | POA: Diagnosis not present

## 2019-10-02 DIAGNOSIS — M6281 Muscle weakness (generalized): Secondary | ICD-10-CM | POA: Diagnosis not present

## 2019-10-02 DIAGNOSIS — I5032 Chronic diastolic (congestive) heart failure: Secondary | ICD-10-CM | POA: Diagnosis not present

## 2019-10-02 DIAGNOSIS — Z743 Need for continuous supervision: Secondary | ICD-10-CM | POA: Diagnosis not present

## 2019-10-02 DIAGNOSIS — S78111D Complete traumatic amputation at level between right hip and knee, subsequent encounter: Secondary | ICD-10-CM | POA: Diagnosis not present

## 2019-10-02 DIAGNOSIS — S78112D Complete traumatic amputation at level between left hip and knee, subsequent encounter: Secondary | ICD-10-CM | POA: Diagnosis not present

## 2019-10-02 DIAGNOSIS — E1121 Type 2 diabetes mellitus with diabetic nephropathy: Secondary | ICD-10-CM | POA: Diagnosis not present

## 2019-10-02 DIAGNOSIS — R2689 Other abnormalities of gait and mobility: Secondary | ICD-10-CM | POA: Diagnosis not present

## 2019-10-02 DIAGNOSIS — I10 Essential (primary) hypertension: Secondary | ICD-10-CM | POA: Diagnosis not present

## 2019-10-02 DIAGNOSIS — I509 Heart failure, unspecified: Secondary | ICD-10-CM | POA: Diagnosis not present

## 2019-10-02 DIAGNOSIS — M2550 Pain in unspecified joint: Secondary | ICD-10-CM | POA: Diagnosis not present

## 2019-10-02 DIAGNOSIS — Z89611 Acquired absence of right leg above knee: Secondary | ICD-10-CM | POA: Diagnosis not present

## 2019-10-02 DIAGNOSIS — Z7401 Bed confinement status: Secondary | ICD-10-CM | POA: Diagnosis not present

## 2019-10-02 LAB — BASIC METABOLIC PANEL
Anion gap: 7 (ref 5–15)
BUN: 22 mg/dL (ref 8–23)
CO2: 23 mmol/L (ref 22–32)
Calcium: 7.6 mg/dL — ABNORMAL LOW (ref 8.9–10.3)
Chloride: 104 mmol/L (ref 98–111)
Creatinine, Ser: 1.13 mg/dL — ABNORMAL HIGH (ref 0.44–1.00)
GFR calc Af Amer: 56 mL/min — ABNORMAL LOW (ref >60)
GFR calc non Af Amer: 48 mL/min — ABNORMAL LOW (ref >60)
Glucose, Bld: 120 mg/dL — ABNORMAL HIGH (ref 70–99)
Potassium: 3.9 mmol/L (ref 3.5–5.1)
Sodium: 134 mmol/L — ABNORMAL LOW (ref 135–145)

## 2019-10-02 LAB — CBC
HCT: 30.9 % — ABNORMAL LOW (ref 36.0–46.0)
Hemoglobin: 9.7 g/dL — ABNORMAL LOW (ref 12.0–15.0)
MCH: 27.8 pg (ref 26.0–34.0)
MCHC: 31.4 g/dL (ref 30.0–36.0)
MCV: 88.5 fL (ref 80.0–100.0)
Platelets: 296 10*3/uL (ref 150–400)
RBC: 3.49 MIL/uL — ABNORMAL LOW (ref 3.87–5.11)
RDW: 16 % — ABNORMAL HIGH (ref 11.5–15.5)
WBC: 11.8 10*3/uL — ABNORMAL HIGH (ref 4.0–10.5)
nRBC: 0 % (ref 0.0–0.2)

## 2019-10-02 LAB — GLUCOSE, CAPILLARY
Glucose-Capillary: 116 mg/dL — ABNORMAL HIGH (ref 70–99)
Glucose-Capillary: 135 mg/dL — ABNORMAL HIGH (ref 70–99)

## 2019-10-02 MED ORDER — FUROSEMIDE 40 MG PO TABS: 40.0000 mg | ORAL_TABLET | Freq: Every day | ORAL | Status: DC

## 2019-10-02 MED ORDER — GABAPENTIN 100 MG PO CAPS: 100.0000 mg | ORAL_CAPSULE | Freq: Three times a day (TID) | ORAL | Status: DC

## 2019-10-02 MED ORDER — ACETAMINOPHEN 325 MG PO TABS: 650.0000 mg | ORAL_TABLET | Freq: Four times a day (QID) | ORAL | 0 refills | Status: DC

## 2019-10-02 MED ORDER — ENSURE ENLIVE PO LIQD: 237.0000 mL | Freq: Three times a day (TID) | ORAL | 12 refills | Status: DC

## 2019-10-02 NOTE — Progress Notes (Signed)
Family Medicine Teaching Service Daily Progress Note Intern Pager: [phone]  Patient name: [name] Medical record number: [id number] Date of birth: 02/17/1946 Age: 74 y.o. Gender: female  Primary Care Provider: [name], MD Consultants: Vascular surgery Code Status: FULL  Pt Overview and Major Events to Date:  2/4 Admitted, Vasc. surg consulted 2/8-bilateral above-knee amputation  Assessment and Plan: [name] is a 74 y.o. female presenting with concern for wound infection with bilateral lower extremity ulcerations secondary to PAD. PMH is significant for PAD, hypothyroid, hypertension, hyperlipidemia, diabetes, HFpEF, rheumatoid arthritis  Wound infection/bilateral lower extremity ulceration s/p bilateral above-knee amputation Stable, doing well. Pain is controlled with oral pain medications. She has not required any opioids in over 24 hours. Patient status post bilateral above-knee amputation completed 2/8.  Patient status post Zosyn and cefepime. - Vascular surgery following: regular changing of dressings.  - PT/OT: SNF, Wheelchair, supervision for mobility - Cont Tylenol 650 every 6 hours scheduled. Oxycodone 5 mg every 3 hours for moderate pain PRN - Vitals per floor - Cont Gabapentin 100 mg every 8 hours for phantom leg pain - Bladder scans every shift; last scan had PVR of around 280 - Delirium precautions -Patient now stable for discharge to SNF  Anemia.  Stable. Baseline appears to be between 11 and 10. Current hemoglobin 9.7, down from 10.5 - Cont to monitor with daily CBC  Bacteremia-resolved Blood cultures from 2/4 showing Providencia Rettgeri.  Initially started on Zosyn which has been switched for cefepime for total of 7 days antibiotic coverage.  Repeat blood cultures drawn 2/7 showed no growth at 5 days.  Patient remains afebrile.   - Antibiotic course of Zosyn then switched to Cefepime completed 2/10  Right hip pain Patient states she fell  few days ago and since then has had significant right hip pain. Hip x-ray w/o fracture. -Pain relief as above  Hypothyroidism  Last TSH 07/19/2019 showing 7.360. Home medications of Synthroid was increased from 125 MCG to 150 mcg at that time.  Home medications currently include Synthroid 150 MCG. - Continue home medication  Hypertension Chronic, stable.  Most recent blood pressure 150/53.  SBP range of 111-150 and DBP range of 53-70 in past 24 hours. Home medications include metolazone 5 mg twice daily and lasix 160mg  BID.  - Cont Lasix 40mg  daily, holding home metolazone - Cont to monitor kidney function with daily BMP; Scr stable at 1.13 - Cont to monitor fluid status and UOP  Hyperlipidemia Chronic, stable. Home medication include Crestor 20 mg daily.  Most recent cholesterol in November 2020 showed total cholesterol 101, HDL 47, LDL 42. -Continue home statin  HFpEF Most recent echo 04/03/2018 showed ejection fraction 60-65% with grade 1 diastolic dysfunction.  Home medications include Lasix 160 mg twice daily,  metolazone 5 mg twice weekly. -Restart Lasix at 40, one time per day  Weight loss Patient with 35 pound weight loss in the past 2.5 months.  December 2 patient weighing 140 pounds at wound care clinic, today patient weighing 115 pounds.  Possible etiology for weight loss can include nutritional deficiencies, lack of hunger drive, inaccurate scale measurements during one recording or the other, malignancy, malabsorption.  SPEP pattern reflects a polyclonal increase in gamma globulin which can be found in wide variety of infectious/noninfectious/autoimmune disease states. -Upep cancelled inpatient; concern for MM, can be completed in the outpatient setting if needed -Nutrition recommends 1600-1800 kcals per day with 80-90g of protein and 1.2L/day fluid intake which has  been adjusted for her AKA. -Recommend outpatient follow-up for etiology weight loss  Type 2  diabetes Chronic, stable. Patient not taking any home medications.  Last A1c 7.5 on 11/2017. Fasting blood glucose trend of 80, 115, and 116 over past three days. No random glucose checks over 120. - Continue to monitor  History of dyspnea - ?COPD vs asthma Home medications include albuterol inhaler as needed.  Per chart review it appears the patient mostly has difficulty when it is hot outside and uses albuterol inhaler approximately 1-2 times per month for this.  Patient does not have a diagnosed history of asthma or COPD per chart review.  Non-smoker. -Continue as needed albuterol  Rheumatoid arthritis Per chart review patient previously followed by rheumatology and re-referred to rheumatology in October of last year.  Patient had virtual visit with rheumatology on 07/26/2019 at which time orders were placed for rheumatologic labs.  Per that note it seems the patient was previously on methotrexate but stopped in 2013.  Patient currently on no home medications. -Continue to monitor  FEN/GI: Soft diet Prophylaxis:  Lovenox  Disposition: Stable for discharge to SNF  Subjective:  Patient states she feels good this morning. She responds to her name and knows she is at [facility] but is unaware of date. She has no complaints and is sitting up in bed comfortably eating breakfast.  Objective: Temp:  [98.1 F (36.7 C)-99 F (37.2 C)] 98.1 F (36.7 C) (02/13 0345) Pulse Rate:  [67-78] 67 (02/13 0345) Resp:  [15-18] 18 (02/12 2043) BP: (111-150)/(53-70) 150/53 (02/13 0345) SpO2:  [100 %] 100 % (02/13 0345)   Physical Exam: Gen: Alert and Oriented to person and place but not time CV: RRR, no murmurs, normal S1, S2 split Resp: CTAB, no wheezing, rales, or rhonchi, comfortable work of breathing Abd: non-distended, non-tender, soft, +bs in all four quadrants Ext: no clubbing, cyanosis, or edema Skin: warm, dry, intact, no rashes Extremities: Bandages clean and dry from prior bilateral  AKA's   Laboratory Recent Labs  Lab 09/30/19 0251 10/01/19 0210 10/02/19 0311  WBC 9.8 12.5* 11.8*  HGB 10.2* 10.5* 9.7*  HCT 32.5* 33.8* 30.9*  PLT 187 247 296   Recent Labs  Lab 09/30/19 0251 10/01/19 0210 10/02/19 0311  NA 137 135 134*  K 4.2 4.3 3.9  CL 113* 110 104  CO2 20* 19* 23  BUN 26* 26* 22  CREATININE 1.19* 1.07* 1.13*  CALCIUM 7.8* 7.6* 7.6*  GLUCOSE 75 89 120*    Imaging/Diagnostic Tests: No new imaging.  Nuala Alpha, DO 10/02/2019, 7:57 AM PGY-3, [place] Intern pager: [phone], text pages welcome

## 2019-10-02 NOTE — TOC Transition Note (Signed)
Transition of Care [facility]) - CM/SW Discharge Note   Patient Details  Name: [name] MRN: [id number] Date of Birth: 1946-07-20  Transition of [facility]) CM/SW Contact:  Bary Castilla, LCSW Phone Number:[phone] 10/02/2019, 11:10 AM   Clinical Narrative:     Patient will DC to:?[place] date:?10/02/19 Family notified:?Elritta Transport HY:HOOI   Per MD patient ready for DC to [facility] RN, patient, patient's family, and facility notified of DC. Discharge Summary sent to facility. RN given number for report  [phone] room 101a . DC packet on chart. Ambulance transport requested for patient.   CSW signing off.   [place], [place] [phone]  Final next level of care: Skilled Nursing Facility Barriers to Discharge: No Barriers Identified   Patient Goals and CMS Choice Patient states their goals for this hospitalization and ongoing recovery are:: To be able to walk better CMS Medicare.gov Compare Post Acute Care list provided to:: Patient Choice offered to / list presented to : Patient  Discharge Placement              Patient [facility] bed at: [facility] Patient to be transferred to facility by: [place] Name of family member notified: Elritta Patient and family notified of of transfer: 10/09/19  Discharge Plan and Services                                     Social Determinants of Health (SDOH) Interventions     Readmission Risk Interventions No flowsheet data found.

## 2019-10-02 NOTE — Progress Notes (Signed)
Discharge patient to [facility] .  Patient transported by EMS.  IV discontinued.  V/S taken.   Report called in to [facility].  Spoke to [employer].

## 2019-10-02 NOTE — Discharge Instructions (Signed)
Leg Amputation, Care After This sheet gives you information about how to care for yourself after your procedure. Your health care provider may also give you more specific instructions. If you have problems or questions, contact your health care provider. What can I expect after the procedure? After the procedure, it is common to have:  A little blood or fluid coming from your incision.  Pain from your incision.  Pain that feels like it is coming from the leg that has been removed (phantom pain). This can last for a year or longer.  Skin breakdown on your stump (residual limb).  Feelings of depression, anxiety, and fear. Follow these instructions at home: Medicines  Take over-the-counter and prescription medicines only as told by your health care provider.  If you were prescribed an antibiotic medicine, take it as told by your health care provider. Do not stop taking the antibiotic even if you start to feel better. Bathing  Do not take baths, swim, use a hot tub, or get your residual limb wet until your health care provider approves. You may only be allowed to take sponge baths.  Ask your health care provider when you may start taking showers. After taking a shower, make sure to rinse and dry your residual limb carefully. Incision care   Check your residual limb, especially your incision area, every day. Check for: ? More redness, swelling, or pain. ? More fluid or blood. ? Warmth. ? Pus or a bad smell. ? Blisters. ? Scrapes.  Follow instructions from your health care provider about how to take care of your incision. Make sure you: ? Wash your hands with soap and water before you change your bandage (dressing). If soap and water are not available, use hand sanitizer. ? Change your dressing as told by your health care provider. ? Leave stitches (sutures), skin glue, or adhesive strips in place. These skin closures may need to stay in place for 2 weeks or longer. If adhesive strip  edges start to loosen and curl up, you may trim the loose edges. Do not remove adhesive strips completely unless your health care provider tells you to do that. Activity  Return to your normal activities as told by your health care provider. Ask your health care provider what activities are safe for you.  Do physical therapy exercises as told by your health care provider.  If you have been fitted with an artificial leg (prosthesis) or have been given crutches, use them as told by your health care provider. Eating and drinking  Eat a healthy diet that includes whole grains, fruits and vegetables, low-fat dairy products, and lean proteins.  Drink enough fluid to keep your urine pale yellow. Driving  Work with an occupational therapist to learn new strategies for safe driving with an amputation.  Do not drive or use heavy equipment while taking prescription pain medicine. General instructions  To prevent or treat constipation while you are taking prescription pain medicine, your health care provider may recommend that you: ? Drink enough fluid to keep your urine pale yellow. ? Take over-the-counter or prescription medicines. ? Eat foods that are high in fiber, such as fresh fruits and vegetables, whole grains, and beans. ? Limit foods that are high in fat and processed sugars, such as fried and sweet foods.  Do not use oils, lotion, cream, or rubbing alcohol on the remaining part of your leg.  Wear compression stockings as told by your health care provider.  If you have trouble coping   with your amputation, contact your health care provider. Some feelings of depression, anxiety, or fear are normal after an amputation, but if you struggle with these feelings or if they get overwhelming, your provider may be able to recommend a therapist or support group to help you.  Do not use any products that contain nicotine or tobacco, such as cigarettes and e-cigarettes. These can delay bone healing.  If you need help quitting, ask your health care provider.  Keep all follow-up visits as told by your health care provider. This is important. Contact a health care provider if:  You have a fever.  You have more tenderness in your residual limb.  You have a rash or itchy skin.  You have a cough or chills and you feel achy and weak.  You have trouble coping with your amputation.  You have blisters or scrapes on your residual limb. Get help right away if:  You have severe pain in your residual limb.  You have more redness, swelling, or pain around your incision.  You have more fluid or blood coming from your incision.  Your incision feels warm to the touch, tender, and painful.  You have pus or a bad smell coming from your incision.  You feel light-headed and have shortness of breath.  You have blood-soaked bandages.  You cough up blood.  You have chest pain or pain when taking a deep breath or coughing. If you have these symptoms, do not drive yourself to the hospital. Call emergency services right away. If you ever feel like you may hurt yourself or others, or have thoughts about taking your own life, get help right away. You can go to your nearest emergency department or call:  Your local emergency services (911 in the [place]  A suicide crisis helpline, such as the National Suicide Prevention Lifeline at 1-800-273-8255. This is open 24 hours a day. Summary  After a leg amputation, you may have pain that feels like it is coming from the leg that was removed (phantom pain). This can last for a year or longer.  Follow instructions from your health care provider about how to take care of your incision.  Check your residual limb, especially your incision area, every day. More redness, swelling, or pain may be a sign of infection.  Contact your health care provider if you have trouble coping with your amputation. This information is not intended to replace advice given to  you by your health care provider. Make sure you discuss any questions you have with your health care provider. Document Revised: 11/13/2016 Document Reviewed: 11/13/2016 Elsevier Patient Education  2020 Elsevier Inc.  

## 2019-10-02 NOTE — Plan of Care (Signed)
  Problem: Education: Goal: Knowledge of General Education information will improve Description: Including pain rating scale, medication(s)/side effects and non-pharmacologic comfort measures Outcome: Adequate for Discharge   Problem: Health Behavior/Discharge Planning: Goal: Ability to manage health-related needs will improve Outcome: Adequate for Discharge   Problem: Clinical Measurements: Goal: Ability to maintain clinical measurements within normal limits will improve Outcome: Adequate for Discharge Goal: Will remain free from infection Outcome: Adequate for Discharge Goal: Diagnostic test results will improve Outcome: Adequate for Discharge Goal: Respiratory complications will improve Outcome: Adequate for Discharge Goal: Cardiovascular complication will be avoided Outcome: Adequate for Discharge   Problem: Activity: Goal: Risk for activity intolerance will decrease Outcome: Adequate for Discharge   Problem: Nutrition: Goal: Adequate nutrition will be maintained Outcome: Adequate for Discharge   Problem: Coping: Goal: Level of anxiety will decrease Outcome: Adequate for Discharge   Problem: Elimination: Goal: Will not experience complications related to bowel motility Outcome: Adequate for Discharge Goal: Will not experience complications related to urinary retention Outcome: Adequate for Discharge   Problem: Pain Managment: Goal: General experience of comfort will improve Outcome: Adequate for Discharge   Problem: Safety: Goal: Ability to remain free from injury will improve Outcome: Adequate for Discharge   Problem: Skin Integrity: Goal: Risk for impaired skin integrity will decrease Outcome: Adequate for Discharge   Problem: Acute Rehab PT Goals(only PT should resolve) Goal: Pt Will Go Supine/Side To Sit Outcome: Adequate for Discharge Goal: Pt Will Go Sit To Supine/Side Outcome: Adequate for Discharge Goal: Pt Will Transfer Bed To Chair/Chair To  Bed Outcome: Adequate for Discharge   Problem: Increased Nutrient Needs (NI-5.1) Goal: Food and/or nutrient delivery Description: Individualized approach for food/nutrient provision. Outcome: Adequate for Discharge   Problem: Acute Rehab OT Goals (only OT should resolve) Goal: Pt. Will Perform Grooming Outcome: Adequate for Discharge Goal: Pt. Will Perform Lower Body Dressing Outcome: Adequate for Discharge Goal: Pt. Will Transfer To Toilet Outcome: Adequate for Discharge Goal: Pt. Will Perform Toileting-Clothing Manipulation Outcome: Adequate for Discharge   Problem: Acute Rehab OT Goals (only OT should resolve) Goal: Pt. Will Perform Grooming Outcome: Adequate for Discharge Goal: Pt. Will Perform Upper Body Bathing Outcome: Adequate for Discharge Goal: Pt. Will Perform Lower Body Bathing Outcome: Adequate for Discharge Goal: Pt. Will Perform Upper Body Dressing Outcome: Adequate for Discharge Goal: Pt. Will Transfer To Toilet Outcome: Adequate for Discharge Goal: OT Additional ADL Goal #1 Outcome: Adequate for Discharge

## 2019-10-02 NOTE — TOC Progression Note (Signed)
Transition of Care [facility]) - Progression Note    Patient Details  Name: [name] MRN: [id number] Date of Birth: 06/15/1946  Transition of Care [facility]) CM/SW [place],  Phone Number: [phone] 10/02/2019, 9:09 AM  Clinical Narrative:     CSW called [facility] to verify bed readiness and had to leave a message. CSW is awaiting a call back.   CSW will continue to follow for discharge planning needs. Expected Discharge Plan: [place] Barriers to Discharge: Continued Medical Work up, SNF Pending bed offer  Expected Discharge Plan and Services Expected Discharge Plan: [place] arrangements for the past 2 months: Single Family Home                                       Social Determinants of Health (SDOH) Interventions    Readmission Risk Interventions No flowsheet data found.

## 2019-10-04 ENCOUNTER — Encounter (HOSPITAL_COMMUNITY): Payer: Medicare Other

## 2019-10-04 ENCOUNTER — Encounter (HOSPITAL_BASED_OUTPATIENT_CLINIC_OR_DEPARTMENT_OTHER): Payer: Medicare Other | Admitting: Internal Medicine

## 2019-10-05 DIAGNOSIS — E1142 Type 2 diabetes mellitus with diabetic polyneuropathy: Secondary | ICD-10-CM | POA: Diagnosis not present

## 2019-10-05 DIAGNOSIS — R7881 Bacteremia: Secondary | ICD-10-CM | POA: Diagnosis not present

## 2019-10-05 DIAGNOSIS — Z89611 Acquired absence of right leg above knee: Secondary | ICD-10-CM | POA: Diagnosis not present

## 2019-10-05 DIAGNOSIS — I10 Essential (primary) hypertension: Secondary | ICD-10-CM | POA: Diagnosis not present

## 2019-10-05 DIAGNOSIS — I739 Peripheral vascular disease, unspecified: Secondary | ICD-10-CM | POA: Diagnosis not present

## 2019-10-07 DIAGNOSIS — L97119 Non-pressure chronic ulcer of right thigh with unspecified severity: Secondary | ICD-10-CM | POA: Diagnosis not present

## 2019-10-07 DIAGNOSIS — R2689 Other abnormalities of gait and mobility: Secondary | ICD-10-CM | POA: Diagnosis not present

## 2019-10-07 DIAGNOSIS — M6281 Muscle weakness (generalized): Secondary | ICD-10-CM | POA: Diagnosis not present

## 2019-10-07 DIAGNOSIS — R5381 Other malaise: Secondary | ICD-10-CM | POA: Diagnosis not present

## 2019-10-12 DIAGNOSIS — R5381 Other malaise: Secondary | ICD-10-CM | POA: Diagnosis not present

## 2019-10-12 DIAGNOSIS — M6281 Muscle weakness (generalized): Secondary | ICD-10-CM | POA: Diagnosis not present

## 2019-10-12 DIAGNOSIS — R2689 Other abnormalities of gait and mobility: Secondary | ICD-10-CM | POA: Diagnosis not present

## 2019-10-14 DIAGNOSIS — R2689 Other abnormalities of gait and mobility: Secondary | ICD-10-CM | POA: Diagnosis not present

## 2019-10-14 DIAGNOSIS — M6281 Muscle weakness (generalized): Secondary | ICD-10-CM | POA: Diagnosis not present

## 2019-10-14 DIAGNOSIS — R5381 Other malaise: Secondary | ICD-10-CM | POA: Diagnosis not present

## 2019-10-14 DIAGNOSIS — L97119 Non-pressure chronic ulcer of right thigh with unspecified severity: Secondary | ICD-10-CM | POA: Diagnosis not present

## 2019-10-21 DIAGNOSIS — L97119 Non-pressure chronic ulcer of right thigh with unspecified severity: Secondary | ICD-10-CM | POA: Diagnosis not present

## 2019-10-29 DIAGNOSIS — M869 Osteomyelitis, unspecified: Secondary | ICD-10-CM | POA: Diagnosis not present

## 2019-10-29 DIAGNOSIS — D509 Iron deficiency anemia, unspecified: Secondary | ICD-10-CM | POA: Diagnosis not present

## 2019-10-29 DIAGNOSIS — I739 Peripheral vascular disease, unspecified: Secondary | ICD-10-CM | POA: Diagnosis not present

## 2019-10-29 DIAGNOSIS — E440 Moderate protein-calorie malnutrition: Secondary | ICD-10-CM | POA: Diagnosis not present

## 2019-10-29 DIAGNOSIS — I10 Essential (primary) hypertension: Secondary | ICD-10-CM | POA: Diagnosis not present

## 2019-10-29 DIAGNOSIS — L97119 Non-pressure chronic ulcer of right thigh with unspecified severity: Secondary | ICD-10-CM | POA: Diagnosis not present

## 2019-11-02 DIAGNOSIS — E440 Moderate protein-calorie malnutrition: Secondary | ICD-10-CM | POA: Diagnosis not present

## 2019-11-02 DIAGNOSIS — Z89611 Acquired absence of right leg above knee: Secondary | ICD-10-CM | POA: Diagnosis not present

## 2019-11-02 DIAGNOSIS — Z591 Inadequate housing: Secondary | ICD-10-CM | POA: Diagnosis not present

## 2019-11-02 DIAGNOSIS — N189 Chronic kidney disease, unspecified: Secondary | ICD-10-CM | POA: Diagnosis not present

## 2019-11-02 DIAGNOSIS — G546 Phantom limb syndrome with pain: Secondary | ICD-10-CM | POA: Diagnosis not present

## 2019-11-02 DIAGNOSIS — I130 Hypertensive heart and chronic kidney disease with heart failure and stage 1 through stage 4 chronic kidney disease, or unspecified chronic kidney disease: Secondary | ICD-10-CM | POA: Diagnosis not present

## 2019-11-02 DIAGNOSIS — Z8739 Personal history of other diseases of the musculoskeletal system and connective tissue: Secondary | ICD-10-CM | POA: Diagnosis not present

## 2019-11-02 DIAGNOSIS — E039 Hypothyroidism, unspecified: Secondary | ICD-10-CM | POA: Diagnosis not present

## 2019-11-02 DIAGNOSIS — Z4781 Encounter for orthopedic aftercare following surgical amputation: Secondary | ICD-10-CM | POA: Diagnosis not present

## 2019-11-02 DIAGNOSIS — Z89612 Acquired absence of left leg above knee: Secondary | ICD-10-CM | POA: Diagnosis not present

## 2019-11-02 DIAGNOSIS — Z602 Problems related to living alone: Secondary | ICD-10-CM | POA: Diagnosis not present

## 2019-11-02 DIAGNOSIS — E1122 Type 2 diabetes mellitus with diabetic chronic kidney disease: Secondary | ICD-10-CM | POA: Diagnosis not present

## 2019-11-02 DIAGNOSIS — Z7951 Long term (current) use of inhaled steroids: Secondary | ICD-10-CM | POA: Diagnosis not present

## 2019-11-02 DIAGNOSIS — M069 Rheumatoid arthritis, unspecified: Secondary | ICD-10-CM | POA: Diagnosis not present

## 2019-11-02 DIAGNOSIS — Z4801 Encounter for change or removal of surgical wound dressing: Secondary | ICD-10-CM | POA: Diagnosis not present

## 2019-11-02 DIAGNOSIS — Z8631 Personal history of diabetic foot ulcer: Secondary | ICD-10-CM | POA: Diagnosis not present

## 2019-11-02 DIAGNOSIS — E1151 Type 2 diabetes mellitus with diabetic peripheral angiopathy without gangrene: Secondary | ICD-10-CM | POA: Diagnosis not present

## 2019-11-02 DIAGNOSIS — Z993 Dependence on wheelchair: Secondary | ICD-10-CM | POA: Diagnosis not present

## 2019-11-02 DIAGNOSIS — E1142 Type 2 diabetes mellitus with diabetic polyneuropathy: Secondary | ICD-10-CM | POA: Diagnosis not present

## 2019-11-02 DIAGNOSIS — M8580 Other specified disorders of bone density and structure, unspecified site: Secondary | ICD-10-CM | POA: Diagnosis not present

## 2019-11-02 DIAGNOSIS — I509 Heart failure, unspecified: Secondary | ICD-10-CM | POA: Diagnosis not present

## 2019-11-04 ENCOUNTER — Ambulatory Visit: Payer: Medicare Other

## 2019-11-04 ENCOUNTER — Other Ambulatory Visit: Payer: Self-pay

## 2019-11-04 NOTE — Chronic Care Management (AMB) (Signed)
  Care Management   Outreach Note  11/04/2019 Name: [name] MRN: [id number] DOB: 20-Jul-1946  Referred by: [name], MD Reason for referral : Care Coordination (Care Manager RNCM EMMI RED)   [place] on EMMI Alert:    Yesterday's Red Flags: 2    GENERAL DISCHARGE FOR FOLLOW-UP [facility] - SNF - [facility]   Encounter ID: [id number] Patient ID: [id number]   Series Access Code: [id number] Enrolled Date: 11/02/19    Call Language: English Start Date: 11/03/19    Details Wed 11/03/19     [phone] Day # 1     PATIENT GENERAL DISCHARGE FOR FOLLOW-UP: DISCHARGE, FOLLOW-UP, MEDS, WOUND CARE     Time of Last Call Attempt 10:10 AM     Who reached Patient     Got discharge papers? No     Know who to call about changes in condition? No     Emmi Program: PATIENT SATISFACTION: AFTER DISCHARGE EXPECTATIONS Issued      An unsuccessful telephone outreach was attempted today. The patient was referred to the case management team for assistance with care management and care coordination.   Follow Up Plan: A HIPPA compliant phone message was left for the patient providing contact information and requesting a return call.  The care management team will reach out to the patient again over the next 3 days.   [name] RN, BSN, [facility] Care Management Coordinator [place] Phone: [phone] Fax: [phone]

## 2019-11-05 ENCOUNTER — Ambulatory Visit: Payer: Medicare Other | Admitting: Family Medicine

## 2019-11-08 ENCOUNTER — Other Ambulatory Visit: Payer: Self-pay

## 2019-11-08 ENCOUNTER — Ambulatory Visit: Payer: Medicare Other

## 2019-11-08 NOTE — Chronic Care Management (AMB) (Signed)
  Care Management     Care Management Outreach Note  11/08/2019 Name: [name] MRN: [id number] DOB: 1946-05-05   EMMI-  Outreach attempt # 2  to patient   Name: [name] DOB: [birth year]; Age 74 Yesterday's Red Flags: 1     GENERAL DISCHARGE FOR FOLLOW-UP [facility] - SNF - [place]    Encounter ID: [id number] Patient ID: [id number]    Series Access Code: [id number] Enrolled Date: 11/02/19     Call Language: English Start Date: 11/03/19     Details Sat 11/06/19 Wed 11/03/19     [phone] Day # 4 Day # 1     PATIENT GENERAL DISCHARGE FOR FOLLOW-UP: [place] GENERAL DISCHARGE FOR FOLLOW-UP: DISCHARGE, FOLLOW-UP, MEDS, WOUND CARE     Time of Last Call Attempt 4:23 PM 10:10 AM     Who reached Patient Patient     Got discharge papers?   No     Know who to call about changes in condition?   No     Other questions/problems? Yes       Emmi Program: PATIENT SATISFACTION: AFTER DISCHARGE EXPECTATIONS   Issued    RN Case Manager reached out to the patient and introduced myself and explained the call.  The patient stated that she remembered receiving the calls but was confused. I explained that she may receive more calls and if the answers are abnormal I may have to call back. She stated she understood. She said she does know who to call if she has any questions or problems.  She states that she does not have any discharge papers because she went straight to the [place] [facility] from the hospital.  Discussed with the patient about her appointment at the office tomorrow and she stated that she knows she has one and plans to comes.  She will bring all of her medications with her.  She was unable to go over them with me on thee phone because she could not remember them all and her son had put them in a place she could not reach. She wants to discuss about transportation. Since she is a bilateral below the knee amputee and in a wheel chair she said she does not know how she will be able to get  around to appointments.  I will send a message to [place] for them to discuss options.      Follow Up Plan:I will schedule a follow up plan for the patient in 2 weeks   [name] RN, BSN, [place] Phone: [phone] Fax: [phone]

## 2019-11-09 ENCOUNTER — Ambulatory Visit: Payer: Self-pay | Admitting: Licensed Clinical Social Worker

## 2019-11-09 ENCOUNTER — Ambulatory Visit: Payer: Medicare Other | Admitting: Family Medicine

## 2019-11-09 NOTE — Chronic Care Management (AMB) (Signed)
  Social Work Care Management  Unsuccessful Phone Outreach   11/09/2019 Name: [name] MRN: [id number] DOB: 06-25-1946  Referred by: [name], MD,  Reason for referral : Care Coordination (transportation needs)   [name] is a 74 y.o. year old female who sees [name], Delorse Limber, MD for primary care.  LCSW received referral to from [facility] RN care manager to contact patient reference transportation concerns.  Called patient to assess needs and barriers reference the above referral. Telephone outreach was unsuccessful. A HIPPA compliant phone message was left for the patient providing contact information and requesting a return call.  Plan:  If no return call is received. LCSW will call again in 3 to 7 days.  [name], LCSW Clinical Social Worker [place] / [place]   [phone] 1:58 PM

## 2019-11-10 ENCOUNTER — Ambulatory Visit (INDEPENDENT_AMBULATORY_CARE_PROVIDER_SITE_OTHER): Payer: Medicare Other | Admitting: Family Medicine

## 2019-11-10 ENCOUNTER — Other Ambulatory Visit: Payer: Self-pay

## 2019-11-10 ENCOUNTER — Ambulatory Visit: Payer: Medicare Other | Admitting: Licensed Clinical Social Worker

## 2019-11-10 ENCOUNTER — Encounter: Payer: Self-pay | Admitting: Family Medicine

## 2019-11-10 ENCOUNTER — Ambulatory Visit: Payer: Medicare Other

## 2019-11-10 ENCOUNTER — Encounter: Payer: Self-pay | Admitting: Licensed Clinical Social Worker

## 2019-11-10 VITALS — BP 122/64 | HR 80

## 2019-11-10 DIAGNOSIS — Z7189 Other specified counseling: Secondary | ICD-10-CM

## 2019-11-10 DIAGNOSIS — Z89611 Acquired absence of right leg above knee: Secondary | ICD-10-CM | POA: Diagnosis not present

## 2019-11-10 DIAGNOSIS — Z139 Encounter for screening, unspecified: Secondary | ICD-10-CM

## 2019-11-10 DIAGNOSIS — E1165 Type 2 diabetes mellitus with hyperglycemia: Secondary | ICD-10-CM

## 2019-11-10 DIAGNOSIS — Z594 Lack of adequate food and safe drinking water: Secondary | ICD-10-CM

## 2019-11-10 DIAGNOSIS — Z89612 Acquired absence of left leg above knee: Secondary | ICD-10-CM

## 2019-11-10 DIAGNOSIS — Z5941 Food insecurity: Secondary | ICD-10-CM

## 2019-11-10 LAB — POCT GLYCOSYLATED HEMOGLOBIN (HGB A1C): HbA1c, POC (controlled diabetic range): 6 % (ref 0.0–7.0)

## 2019-11-10 NOTE — Patient Instructions (Signed)
Licensed Clinical Social Worker Visit Information Ms. Degraaf  it was nice speaking with you. Please call me directly if you have questions [phone] Goals we discussed today:  Goals Addressed            This Visit's Progress   . transportation       [place] (see longitudinal plan of care for additional care plan information) Current Barriers:  . Patient with HTN and memory concerns  needs community resources for transportation,  . Acknowledges deficits and needs support, education and care coordination in order to meet this unmet need  . Lacks knowledge of community resource:   . Patient reports recently calling [facility] for transportation to medical appointments (in the past she paid for taxi as she did not know the insurance provided this service) Clinical Goal(s)  . Over the next 30 days, patient will have all transportation needs met for medical and other appointment by work with LCSW to complete SCAT applications Interventions provided by LCSW:  . Assessed patient's care coordination needs and discussed ongoing care management follow up  . Provided patient with information about SCAT program . Collaborated with CCM RN care team members regarding patient needs Patient Self Care Activities & Deficits:  . Patient is unable to independently navigate community resource options without care coordination support  . Acknowledges deficits and is motivated to resolve concern  . Patient is unable to contact SCAT to complete application without assistance   Initial goal documentation     Materials provided: Verbal education about transportation and assessed other needs provided by phone Ms. Gena Fray received Care Management services today:  1. Care Management services include personalized support from designated clinical staff supervised by her physician, including individualized plan of care and coordination with other care providers 2. 24/7 contact [phone] for assistance for urgent  and routine care needs. 3. Care Management are voluntary services and be declined at any time by calling the office.  Patient  verbally agreed to assistance and services provided by embedded care coordination/care management team today.   Patient verbalizes understanding of instructions provided today.  Follow up plan:  SW will follow up with patient by phone over the next week  Maurine Cane, LCSW

## 2019-11-10 NOTE — Chronic Care Management (AMB) (Signed)
Care Management  Clinical Social Work Psychosocial assessment    11/10/2019 Name: [name] MRN: [id number] DOB: 22-Sep-1945  [name] is a 74 y.o. year old female who sees [name], Delorse Limber, MD for primary care.  Referred by Dr. Nori Riis during office visit for assessing psychosocial needs, support and barriers. (Look under patient history for updates). Patient's interverview was a collaborative with CCM RN in the office with patient and CCM SW on the speaker phone.  Patient presents confused at time but is pleasant, engaged in conversation and thankful for assistance from [facility] team.   Assessment: Patient recently discharged from SNF. HH came to her home and contacted Adult Protective Services since she does not have central heat, hot water or food in her frig.  Patient reports central heat and hot water have not worked in over 2 years and she has managed fine. It will cost 3K to replace each. Patient states someone spoke to her about going to an assisted living but she can't remember who is was.  Patient's grandsons lives with her and is providing food.  However since she was in SNF they ate out and did not buy food for the frig.  Patient reports having plenty of can food. Patient also having difficulty with incontinent briefs as they do not fit properly. Other options for patient are to live with her grandson or go to assisted living.  She has not made a decision at this time.  Recommendation: After talking with patient and collaboration with CCM RN.  Dr. Nori Riis and APS worker Samule Ohm it is determined that patient may benefit from continued CCM support for care coordination of ongoing needs until APS assessment is complete. APS worker confirmed that [facility] will not turn to patient's home due to the current conditions. They have given patient 30 days to bring home to standard before the will return.  In exploring options for patient it may be in patient's best interest to goes to live with her  grandson so that home health can come to care for her. APS worker will discuss this option with patient.   Interventions: CCM team provided patient with [facility] food box, incontinent briefs and SCAT application. Discussed meals on wheels and patient provided permission for LCSW to make referral. LCSW called [facility] with Samule Ohm [place] worker [phone].  She received report on patient for self neglect. Reeves Forth has spoken to patient and will scheduled a home visit this week.  SDOH (Social Determinants of Health) assessments performed: Yes:  SDOH Interventions     Most Recent Value  SDOH Interventions  SDOH Interventions for the Following Domains  Transportation, Food Insecurity  Food Insecurity Interventions  Other (Comment) [provided [facility] foodbox]  Transportation Interventions  SCAT (Specialized Community Area Transporation)      Goals Addressed            This Visit's Progress   . transportation       [place] (see longitudinal plan of care for additional care plan information) Current Barriers:  . Patient with HTN and memory concerns  needs community resources for transportation,  . Acknowledges deficits and needs support, education and care coordination in order to meet this unmet need  . Lacks knowledge of community resource:   . Patient reports recently calling [facility] for transportation to medical appointments (in the past she paid for taxi as she did not know the insurance provided this service) Clinical Goal(s)  . Over the next 30  days, patient will have all transportation needs met for medical and other appointment by work with LCSW to complete SCAT applications Interventions provided by LCSW:  . Assessed patient's care coordination needs and discussed ongoing care management follow up  . Provided patient with information about SCAT program . Collaborated with CCM RN care team members regarding patient needs Patient Self Care Activities & Deficits:   . Patient is unable to independently navigate community resource options without care coordination support  . Acknowledges deficits and is motivated to resolve concern  . Patient is unable to contact SCAT to complete application without assistance   Initial goal documentation      Advance Directive Status: Not assessed during this encounter.  Review of patient status, including review of consultants reports, relevant laboratory and other test results, and collaboration with appropriate care team members and the patient's provider was performed as part of comprehensive patient evaluation and provision of chronic care management services.   Outpatient Encounter Medications as of 11/10/2019  Medication Sig  . acetaminophen (TYLENOL) 325 MG tablet Take 2 tablets (650 mg total) by mouth every 6 (six) hours.  . Blood Glucose Monitoring Suppl (ONETOUCH VERIO) w/Device KIT Check sugar 3 times per day  . feeding supplement, ENSURE ENLIVE, (ENSURE ENLIVE) LIQD Take 237 mLs by mouth 3 (three) times daily between meals.  . ferrous sulfate 325 (65 FE) MG tablet Take 1 tablet (325 mg total) by mouth every other day.  . furosemide (LASIX) 40 MG tablet Take 1 tablet (40 mg total) by mouth daily.  Marland Kitchen gabapentin (NEURONTIN) 100 MG capsule Take 1 capsule (100 mg total) by mouth every 8 (eight) hours.  Marland Kitchen glucose blood (ONETOUCH VERIO) test strip Check blood sugar daily  . Lancet Devices (MICROLET NEXT LANCING DEVICE) MISC 1 each by Does not apply route 3 (three) times daily. Check blood sugar 3 times daily  . levothyroxine (SYNTHROID) 150 MCG tablet Take 1 tablet (150 mcg total) by mouth daily.  Glory Rosebush DELICA LANCETS 42R MISC Check blood sugar once daily  . PROAIR HFA 108 (90 Base) MCG/ACT inhaler INHALE 2 PUFFS INTO THE LUNGS EVERY 6 HOURS AS NEEDED FOR WHEEZING OR SHORTNESS OF BREATH  . rosuvastatin (CRESTOR) 20 MG tablet Take 1 tablet (20 mg total) by mouth daily.   No facility-administered encounter  medications on file as of 11/10/2019.   Patient agreed to services provided today and verbal consent obtained.   Plan:   1. APS worker will contact LCSW after she completes the assessment 2. CCM team will F/U with patient in 3 to 5 days 3. LCSW will assist patient with SCAT application  [name], LCSW Clinical Social Worker [place] / [place]   [phone] 2:11 PM

## 2019-11-12 DIAGNOSIS — Z5941 Food insecurity: Secondary | ICD-10-CM | POA: Insufficient documentation

## 2019-11-12 DIAGNOSIS — Z89611 Acquired absence of right leg above knee: Secondary | ICD-10-CM | POA: Insufficient documentation

## 2019-11-12 DIAGNOSIS — Z594 Lack of adequate food and safe drinking water: Secondary | ICD-10-CM | POA: Insufficient documentation

## 2019-11-12 DIAGNOSIS — Z89612 Acquired absence of left leg above knee: Secondary | ICD-10-CM | POA: Insufficient documentation

## 2019-11-12 DIAGNOSIS — E1165 Type 2 diabetes mellitus with hyperglycemia: Secondary | ICD-10-CM | POA: Insufficient documentation

## 2019-11-12 NOTE — Assessment & Plan Note (Signed)
We gave her a food box today both the case manager and the social worker spoke with her today while she was in my office and are trying to make sure she is hooked up with all appropriate services.

## 2019-11-12 NOTE — Assessment & Plan Note (Signed)
Her A1c is much improved today.  We will continue current medication regimen.  I am not sure how much she has been eating so I am not 867% certain that where where we need to be with this.  I will have her follow-up in 1 week.

## 2019-11-12 NOTE — Progress Notes (Signed)
    CHIEF COMPLAINT / HPI: #1.  Here for follow-up diabetes mellitus.  She came home from the hospital, her family had not restock her cupboards so she has had some difficulty with food insecurity. 2.  Unclear what the plan was for her wound care when she got out of the skilled nursing facility.  Evidently a home health nurse came out to see her once but did not come back.  She tells me she did not come back secondary to lack of hot water in her house.  Mrs. Marian Sorrow says she is not had hot water and her furnace has not worked for the last 1 to 2 years.  She uses space heaters for heat.  She says her sons come to check on her daily and they have been over last night to help her get some food. 3.  Recent bilateral above-the-knee amputations.  She still has her staples in.  She says they did not do any rehab with her and that she would like to go somewhere where there are "people like me without legs" so that she could learn to use prosthesis. #4.  Hypertension: Taking medications without issue.   PERTINENT  PMH / PSH: I have reviewed the patient's medications, allergies, past medical and surgical history, smoking status and updated in the EMR as appropriate. Bilateral AKA September 27, 2019  OBJECTIVE:  BP 122/64   Pulse 80   SpO2 100%  GENERAL: No acute distress EXTREMITY: Bilateral AKA incisions are without any sign of infection.  There is no fluctuance.  All of her staples are still intact.  The stumps do not appear to be warm or tender particularly.  Other than the staples still being in the incision, it looks like this is healing pretty well.  ASSESSMENT / PLAN:   Type 2 diabetes mellitus (HCC) Her A1c is much improved today.  We will continue current medication regimen.  I am not sure how much she has been eating so I am not 703% certain that where where we need to be with this.  I will have her follow-up in 1 week.  S/P AKA (above knee amputation) bilateral (HCC) We called the vascular  surgeon's office and talk to them about the staples still being in despite her surgery being several weeks ago.  They said she had a follow-up appointment on the 30th and that would be fine to take them out then.  I dressed them with nonstick pads and some gauze dressing to hold those in place.  Once these are out, we need to get her set up for some physical therapy.  I am not sure what was done, if anything at her skilled nursing facility.  I do not have a discharge summary from there.  Food insecurity We gave her a food box today both the case manager and the social worker spoke with her today while she was in my office and are trying to make sure she is hooked up with all appropriate services.   Dorcas Mcmurray MD

## 2019-11-12 NOTE — Assessment & Plan Note (Signed)
We called the vascular surgeon's office and talk to them about the staples still being in despite her surgery being several weeks ago.  They said she had a follow-up appointment on the 30th and that would be fine to take them out then.  I dressed them with nonstick pads and some gauze dressing to hold those in place.  Once these are out, we need to get her set up for some physical therapy.  I am not sure what was done, if anything at her skilled nursing facility.  I do not have a discharge summary from there.

## 2019-11-15 ENCOUNTER — Other Ambulatory Visit: Payer: Self-pay

## 2019-11-15 ENCOUNTER — Ambulatory Visit: Payer: Medicare Other

## 2019-11-15 NOTE — Patient Instructions (Signed)
Visit Information  Goals Addressed            This Visit's Progress   . " I am confused with having my legs amputatated and trying to cope" (pt-stated)       CARE PLAN ENTRY (see longtitudinal plan of care for additional care plan information)  Current Barriers:  . Chronic Disease Management support and education needs/crisis intervention in patient with bilateral below the knee amputation, Iron deficiency anemia, and community resources/urgent housing crisis  . Knowledge Deficits related to imbalanced nutrition less than body requirements as evidenced by Moderate protein-calorie malnutrition  Nurse Case Manager Clinical Goal(s):  Marland Kitchen Over the next 30 days, patient will verbalize understanding of plan  Interventions:  . Evaluation of current treatment plan related patient's adherence to plan as established by provider. . Provided education to patient re: nutrition and wound healing . Collaborated with SW regarding transportation . Discussed plans with patient for ongoing care management follow up and provided patient with direct contact information for care management team . Patient stated that [facility] came to her home and she did not have hot water or heat.  They called APS and she had 30 days to fix the problem.The patient states that she has lived in the home and has not had hot water or heat for over 2 years and done well.  She has spoken to people to fix the problem and it will cost 6k.  She has her grandsons that are staying with her.  They provide food for her.  They are mainly eating out.  She states that she does have canned food in the home but nothing in the frig.  She states that she is confused with all of the people she has spoken with and can't remember names.  Social Worker [name] was on the phone by speaker in the room talking with the patient . Patient was given depends for incontinence,  a box of food and a scat applications for transportation. I will f/u with the patient  on 11/15/19  Patient Self Care Activities:  . Patient verbalizes understanding of plan  . Attends all scheduled provider appointments . Calls provider office for new concerns or questions . Unable to independently self manage community resources  Initial goal documentation        [name] was given information about Care Management services today including:  1. Care Management services include personalized support from designated clinical staff supervised by her physician, including individualized plan of care and coordination with other care providers 2. 24/7 contact phone numbers for assistance for urgent and routine care needs. 3. The patient may stop CCM services at any time (effective at the end of the month) by phone call to the office staff.  Patient agreed to services and verbal consent obtained.   The patient verbalized understanding of instructions provided today and declined a print copy of patient instruction materials.   The care management team will reach out to the patient again over the next 5 days.  The patient has been provided with contact information for the care management team and has been advised to call with any health related questions or concerns.   [name] RN, BSN, [facility] Care Management Coordinator [place] Phone: [phone] Fax: [phone]

## 2019-11-15 NOTE — Chronic Care Management (AMB) (Signed)
Care Management   Initial Visit Note  11/15/2019 Name: [name] MRN: [id number] DOB: Sep 12, 1945   Assessment: [name] is a 74 y.o. year old female who sees [name], Delorse Limber, MD for primary care. The care management team was consulted for assistance with care management and care coordination needs related to Disease Management Educational Needs and Care Coordination for crisis intervention.   Review of patient status, including review of consultants reports, relevant laboratory and other test results, and collaboration with appropriate care team members and the patient's provider was performed as part of comprehensive patient evaluation and provision of care management services.    SDOH (Social Determinants of Health) assessments performed: No See Care Plan activities for detailed interventions related to [facility])     Outpatient Encounter Medications as of 11/10/2019  Medication Sig  . acetaminophen (TYLENOL) 325 MG tablet Take 2 tablets (650 mg total) by mouth every 6 (six) hours.  . Blood Glucose Monitoring Suppl (ONETOUCH VERIO) w/Device KIT Check sugar 3 times per day  . feeding supplement, ENSURE ENLIVE, (ENSURE ENLIVE) LIQD Take 237 mLs by mouth 3 (three) times daily between meals.  . furosemide (LASIX) 40 MG tablet Take 1 tablet (40 mg total) by mouth daily.  Marland Kitchen gabapentin (NEURONTIN) 100 MG capsule Take 1 capsule (100 mg total) by mouth every 8 (eight) hours.  Marland Kitchen glucose blood (ONETOUCH VERIO) test strip Check blood sugar daily  . Lancet Devices (MICROLET NEXT LANCING DEVICE) MISC 1 each by Does not apply route 3 (three) times daily. Check blood sugar 3 times daily  . levothyroxine (SYNTHROID) 150 MCG tablet Take 1 tablet (150 mcg total) by mouth daily.  Glory Rosebush DELICA LANCETS 09Q MISC Check blood sugar once daily  . PROAIR HFA 108 (90 Base) MCG/ACT inhaler INHALE 2 PUFFS INTO THE LUNGS EVERY 6 HOURS AS NEEDED FOR WHEEZING OR SHORTNESS OF BREATH  . rosuvastatin (CRESTOR)  20 MG tablet Take 1 tablet (20 mg total) by mouth daily.  . [DISCONTINUED] ferrous sulfate 325 (65 FE) MG tablet Take 1 tablet (325 mg total) by mouth every other day.   No facility-administered encounter medications on file as of 11/10/2019.    Goals Addressed            This Visit's Progress   . " I am confused with having my legs amputatated and trying to cope" (pt-stated)       CARE PLAN ENTRY (see longtitudinal plan of care for additional care plan information)  Current Barriers:  . Chronic Disease Management support and education needs/crisis intervention in patient with bilateral below the knee amputation, Iron deficiency anemia, and community resources/urgent housing crisis  . Knowledge Deficits related to imbalanced nutrition less than body requirements as evidenced by Moderate protein-calorie malnutrition  Nurse Case Manager Clinical Goal(s):  Marland Kitchen Over the next 30 days, patient will verbalize understanding of plan  Interventions:  . Evaluation of current treatment plan related patient's adherence to plan as established by provider. . Provided education to patient re: nutrition and wound healing . Collaborated with SW regarding transportation . Discussed plans with patient for ongoing care management follow up and provided patient with direct contact information for care management team . Patient stated that [facility] came to her home and she did not have hot water or heat.  They called APS and she had 30 days to fix the problem.The patient states that she has lived in the home and has not had hot water or heat for over  2 years and done well.  She has spoken to people to fix the problem and it will cost 6k.  She has her grandsons that are staying with her.  They provide food for her.  They are mainly eating out.  She states that she does have canned food in the home but nothing in the frig.  She states that she is confused with all of the people she has spoken with and can't remember names.   Social Worker [name] was on the phone by speaker in the room talking with the patient . Patient was given depends for incontinence,  a box of food and a scat applications for transportation. I will f/u with the patient on 11/15/19  Patient Self Care Activities:  . Patient verbalizes understanding of plan  . Attends all scheduled provider appointments . Calls provider office for new concerns or questions . Unable to independently self manage community resources  Initial goal documentation         Follow up plan:  The care management team will reach out to the patient again over the next 5 days.  The patient has been provided with contact information for the care management team and has been advised to call with any health related questions or concerns.   [name] was given information about Care Management services today including:  1. Care Management services include personalized support from designated clinical staff supervised by a physician, including individualized plan of care and coordination with other care providers 2. 24/7 contact phone numbers for assistance for urgent and routine care needs. 3. The patient may stop Care Management services at any time (effective at the end of the month) by phone call to the office staff.  Patient agreed to services and verbal consent obtained.  [name] RN, BSN, [facility] Care Management Coordinator [place] Phone: [phone] Fax: [phone]

## 2019-11-16 ENCOUNTER — Telehealth (HOSPITAL_COMMUNITY): Payer: Self-pay

## 2019-11-16 NOTE — Telephone Encounter (Signed)

## 2019-11-17 ENCOUNTER — Other Ambulatory Visit: Payer: Self-pay

## 2019-11-17 ENCOUNTER — Ambulatory Visit: Payer: Self-pay | Admitting: Licensed Clinical Social Worker

## 2019-11-17 ENCOUNTER — Ambulatory Visit (INDEPENDENT_AMBULATORY_CARE_PROVIDER_SITE_OTHER): Payer: Self-pay | Admitting: Physician Assistant

## 2019-11-17 VITALS — BP 142/79 | HR 69 | Temp 97.7°F

## 2019-11-17 DIAGNOSIS — R6889 Other general symptoms and signs: Secondary | ICD-10-CM | POA: Diagnosis not present

## 2019-11-17 DIAGNOSIS — Z89611 Acquired absence of right leg above knee: Secondary | ICD-10-CM

## 2019-11-17 DIAGNOSIS — Z139 Encounter for screening, unspecified: Secondary | ICD-10-CM

## 2019-11-17 DIAGNOSIS — Z89612 Acquired absence of left leg above knee: Secondary | ICD-10-CM

## 2019-11-17 NOTE — Progress Notes (Signed)
  POST OPERATIVE OFFICE NOTE    CC:  F/u for surgery  HPI:  This is a 74 y.o. female who is s/p Bilateral AKA by Dr. Scot Dock on 09/27/2019.  She was discharged to SNF.  She saw her PCP on 3/26 and at that time, her staples were still in and given her appt was today, she kept this appt.    She states that she had been at [facility] rehab and went home recently.  Her grandson continues to help her.  She hasn't had any trouble with her stumps.  Allergies  Allergen Reactions  . Feraheme [Ferumoxytol] Itching  . Rosiglitazone Maleate Other (See Comments)    REACTION: Difficulty walking, Fatigue, shortness of breath    Current Outpatient Medications  Medication Sig Dispense Refill  . acetaminophen (TYLENOL) 325 MG tablet Take 2 tablets (650 mg total) by mouth every 6 (six) hours. 30 tablet 0  . Blood Glucose Monitoring Suppl (ONETOUCH VERIO) w/Device KIT Check sugar 3 times per day 1 kit 0  . feeding supplement, ENSURE ENLIVE, (ENSURE ENLIVE) LIQD Take 237 mLs by mouth 3 (three) times daily between meals. 237 mL 12  . furosemide (LASIX) 40 MG tablet Take 1 tablet (40 mg total) by mouth daily. 30 tablet   . gabapentin (NEURONTIN) 100 MG capsule Take 1 capsule (100 mg total) by mouth every 8 (eight) hours. 30 capsule   . glucose blood (ONETOUCH VERIO) test strip Check blood sugar daily 100 each 3  . Lancet Devices (MICROLET NEXT LANCING DEVICE) MISC 1 each by Does not apply route 3 (three) times daily. Check blood sugar 3 times daily 1 each 0  . levothyroxine (SYNTHROID) 150 MCG tablet Take 1 tablet (150 mcg total) by mouth daily. 90 tablet 0  . ONETOUCH DELICA LANCETS 54Y MISC Check blood sugar once daily 100 each 3  . PROAIR HFA 108 (90 Base) MCG/ACT inhaler INHALE 2 PUFFS INTO THE LUNGS EVERY 6 HOURS AS NEEDED FOR WHEEZING OR SHORTNESS OF BREATH 8.5 Inhaler 0  . rosuvastatin (CRESTOR) 20 MG tablet Take 1 tablet (20 mg total) by mouth daily. 90 tablet 3   No current facility-administered  medications for this visit.     ROS:  See HPI  Physical Exam:  Today's Vitals   11/17/19 0821  BP: (!) 142/79  Pulse: 69  Temp: 97.7 F (36.5 C)  SpO2: 98%     Incision:  Clean and dry with staples in tact. Extremities:  Moving both stumps without difficulty   Assessment/Plan:  This is a 74 y.o. female who is s/p: Bilateral AKA by Dr. Scot Dock on 09/27/2019  -staples removed today and incisions have healed nicely. -she will f/u as needed.    Leontine Locket, [facility] [phone]  Clinic MD:  Scot Dock

## 2019-11-17 NOTE — Patient Instructions (Signed)
Licensed Clinical Social Worker Visit Information [name]  it was nice speaking with you. Please call me directly if you have questions [phone] Goals we discussed today:  Goals Addressed            This Visit's Progress   . transportation   On track    [place] (see longitudinal plan of care for additional care plan information) Current Barriers & Progress:  . Patient with HTN and memory concerns  needs community resources for transportation,  . Acknowledges deficits and needs support, education and care coordination in order to meet this unmet need  . Lacks knowledge of community resource:   . Patient has established transportation with [facility] for all medical appointments Clinical Goal(s)  . Over the next 30 days, patient will have all transportation needs met for medical and other appointment by work with LCSW to complete SCAT applications Interventions provided by LCSW:  . Assessed patient's care coordination needs and discussed ongoing care management follow up  . Provided patient with information about SCAT program . Assisted patient with completing part A of SCAT application placed part B in mailbox for PCP . Collaborated with CCM RN care team members regarding patient needs Patient Self Care Activities & Deficits:  . Patient is unable to independently navigate community resource options without care coordination support  . Acknowledges deficits and is motivated to resolve concern  Please see past updates related to this goal by clicking on the "Past Updates" button in the selected goal       Materials provided:  [name] received Care Management services today:  1. Care Management services include personalized support from designated clinical staff supervised by her physician, including individualized plan of care and coordination with other care providers 2. 24/7 contact [phone] for assistance for urgent and routine care needs. 3. Care Management are voluntary  services and be declined at any time by calling the office.  Patient  verbally agreed to assistance and services provided by embedded care coordination/care management team today.   Patient verbalizes understanding of instructions provided today.  Follow up plan: SW will follow up with patient during next office visit  Maurine Cane, LCSW

## 2019-11-17 NOTE — Chronic Care Management (AMB) (Signed)
Care Management   Clinical Social Work Follow Up   11/17/2019 Name: [name] MRN: [id number] DOB: Jul 29, 1946  Referred by: [name], MD  Reason for referral : Care Coordination (transportation)  [name] is a 74 y.o. year old female who is a primary care patient of [name], MD.  Reason for follow-up: Phone encounter with patient today for ongoing assessment and brief interventions to assist with care coordination needs.    Assessment: Patient is making progress towards goal. Has transportation in place for medical appointments and progressing with SCAT transportation to non medical locations.  Review of patient status, including review of consultants reports, relevant laboratory and other test results, and collaboration with appropriate care team members and the patient's provider was performed as part of comprehensive patient evaluation and provision of care management services.    Advance Directive Status: Not addressed during this encounter. SDOH (Social Determinants of Health) assessments performed: No new needs identified   Goals Addressed            This Visit's Progress   . transportation   On track    [place] (see longitudinal plan of care for additional care plan information) Current Barriers & Progress:  . Patient with HTN and memory concerns  needs community resources for transportation,  . Acknowledges deficits and needs support, education and care coordination in order to meet this unmet need  . Lacks knowledge of community resource:   . Patient has established transportation with [facility] for all medical appointments Clinical Goal(s)  . Over the next 30 days, patient will have all transportation needs met for medical and other appointment by work with LCSW to complete SCAT applications Interventions provided by LCSW:  . Assessed patient's care coordination needs and discussed ongoing care management follow up  . Provided patient  with information about SCAT program . Assisted patient with completing part A of SCAT application placed part B in mailbox for PCP . Collaborated with CCM RN care team members regarding patient needs Patient Self Care Activities & Deficits:  . Patient is unable to independently navigate community resource options without care coordination support  . Acknowledges deficits and is motivated to resolve concern  Please see past updates related to this goal by clicking on the "Past Updates" button in the selected goal           Outpatient Encounter Medications as of 11/17/2019  Medication Sig  . acetaminophen (TYLENOL) 325 MG tablet Take 2 tablets (650 mg total) by mouth every 6 (six) hours.  . Blood Glucose Monitoring Suppl (ONETOUCH VERIO) w/Device KIT Check sugar 3 times per day  . feeding supplement, ENSURE ENLIVE, (ENSURE ENLIVE) LIQD Take 237 mLs by mouth 3 (three) times daily between meals.  . furosemide (LASIX) 40 MG tablet Take 1 tablet (40 mg total) by mouth daily.  Marland Kitchen gabapentin (NEURONTIN) 100 MG capsule Take 1 capsule (100 mg total) by mouth every 8 (eight) hours.  Marland Kitchen glucose blood (ONETOUCH VERIO) test strip Check blood sugar daily  . Lancet Devices (MICROLET NEXT LANCING DEVICE) MISC 1 each by Does not apply route 3 (three) times daily. Check blood sugar 3 times daily  . levothyroxine (SYNTHROID) 150 MCG tablet Take 1 tablet (150 mcg total) by mouth daily.  [name] MISC Check blood sugar once daily  . PROAIR HFA 108 (90 Base) MCG/ACT inhaler INHALE 2 PUFFS INTO THE LUNGS EVERY 6 HOURS AS NEEDED FOR WHEEZING OR SHORTNESS  OF BREATH  . rosuvastatin (CRESTOR) 20 MG tablet Take 1 tablet (20 mg total) by mouth daily.   No facility-administered encounter medications on file as of 11/17/2019.   Plan: LCSW will touch base with patient during office visit tomorrow.  [name], LCSW Clinical Social Worker [place]     [phone] 3:48 PM

## 2019-11-18 ENCOUNTER — Ambulatory Visit: Payer: Medicare Other | Admitting: Licensed Clinical Social Worker

## 2019-11-18 ENCOUNTER — Ambulatory Visit (INDEPENDENT_AMBULATORY_CARE_PROVIDER_SITE_OTHER): Payer: Medicare Other | Admitting: Family Medicine

## 2019-11-18 ENCOUNTER — Other Ambulatory Visit: Payer: Self-pay

## 2019-11-18 VITALS — BP 138/72 | HR 73

## 2019-11-18 DIAGNOSIS — Z89611 Acquired absence of right leg above knee: Secondary | ICD-10-CM

## 2019-11-18 DIAGNOSIS — M059 Rheumatoid arthritis with rheumatoid factor, unspecified: Secondary | ICD-10-CM

## 2019-11-18 DIAGNOSIS — Z139 Encounter for screening, unspecified: Secondary | ICD-10-CM

## 2019-11-18 DIAGNOSIS — D649 Anemia, unspecified: Secondary | ICD-10-CM

## 2019-11-18 DIAGNOSIS — E039 Hypothyroidism, unspecified: Secondary | ICD-10-CM

## 2019-11-18 DIAGNOSIS — Z594 Lack of adequate food and safe drinking water: Secondary | ICD-10-CM

## 2019-11-18 DIAGNOSIS — I10 Essential (primary) hypertension: Secondary | ICD-10-CM | POA: Diagnosis not present

## 2019-11-18 DIAGNOSIS — Z5941 Food insecurity: Secondary | ICD-10-CM

## 2019-11-18 DIAGNOSIS — D508 Other iron deficiency anemias: Secondary | ICD-10-CM

## 2019-11-18 DIAGNOSIS — M949 Disorder of cartilage, unspecified: Secondary | ICD-10-CM

## 2019-11-18 DIAGNOSIS — Z89612 Acquired absence of left leg above knee: Secondary | ICD-10-CM

## 2019-11-18 DIAGNOSIS — M899 Disorder of bone, unspecified: Secondary | ICD-10-CM

## 2019-11-18 MED ORDER — FERROUS SULFATE 324 MG PO TBEC: 324.0000 mg | DELAYED_RELEASE_TABLET | Freq: Every day | ORAL | 3 refills | Status: DC

## 2019-11-18 NOTE — Assessment & Plan Note (Signed)
Taking iron (ferrous sulfate once per day in medication bag), repeat CBC today.

## 2019-11-18 NOTE — Assessment & Plan Note (Signed)
Feels she has had decline in UE strength at home--- reports her grandsons typically just lift her now. Home health PT to work on transfers at home and see if additional devices are needed.

## 2019-11-18 NOTE — Assessment & Plan Note (Signed)
Last DEXA in 2008--consider repeat in future.

## 2019-11-18 NOTE — Patient Instructions (Signed)
Licensed Clinical Social Worker Visit Information Ms. Novakowski  it was nice speaking with you. Please call me directly if you have questions [phone] Goals we discussed today:  Goals Addressed            This Visit's Progress   . transportation   On track    [place] (see longitudinal plan of care for additional care plan information) Current Barriers & Progress:  . Patient with HTN and memory concerns  needs community resources for transportation,  . Acknowledges deficits and needs support, education and care coordination in order to meet this unmet need  . Lacks knowledge of community resource:   . Patient has established transportation with [facility] for all medical appointments . SCAT application completed and signed by patient and PCP Clinical Goal(s)  . Over the next 30 days, patient will have all transportation needs met for medical and other appointment by work with LCSW to complete SCAT applications Interventions provided by LCSW:  . Assessed patient's care coordination needs and discussed ongoing care management follow up  . Provided patient with information about SCAT program . Assisted patient with completing part A of SCAT application placed part B in mailbox for PCP . SCAT application faxed for patient.  Copy given to patient . Collaborated with CCM RN care team members regarding patient needs Patient Self Care Activities & Deficits:  . Patient is unable to independently navigate community resource options without care coordination support  . Acknowledges deficits and is motivated to resolve concern  Please see past updates related to this goal by clicking on the "Past Updates" button in the selected goal       Materials provided: Ms. Holdren received Care Management services today:  1. Care Management services include personalized support from designated clinical staff supervised by her physician, including individualized plan of care and coordination with other care  providers 2. 24/7 contact [phone] for assistance for urgent and routine care needs. 3. Care Management are voluntary services and be declined at any time by calling the office. Patient  verbally agreed to assistance and services provided by embedded care coordination/care management team today.   Patient verbalizes understanding of instructions provided today.  Follow up plan:  SW will follow up with patient by phone over the next 1 to 2 weeks  Maurine Cane, LCSW

## 2019-11-18 NOTE — Patient Instructions (Signed)
1. I will send an order for Home Physical Therapy to help you get strong again 2. I will order a wheel chair cushion   Please call medicaid to see if you qualify---the pamphlet is in the front of your bag  Ms. Laurance Flatten will be reaching out to check in   I will call you with results  It was wonderful to see you today.  Please bring ALL of your medications with you to every visit.   Thank you for choosing [facility].   Please call [phone] with any questions about today's appointment.  Please be sure to schedule follow up at the front  desk before you leave today.   Dorris Singh, MD  Family Medicine

## 2019-11-18 NOTE — Assessment & Plan Note (Signed)
Repeat TSH today

## 2019-11-18 NOTE — Assessment & Plan Note (Signed)
Improved, but still some concern. Has food stamps. Box of Glucerna given today (she reports she loves Ensure and Glucerna).  [name], LCSW also saw patient, will try to establish meals on wheels.

## 2019-11-18 NOTE — Chronic Care Management (AMB) (Signed)
Care Management   Clinical Social Work Follow Up   11/18/2019 Name: [name] MRN: [id number] DOB: [birth year]  Referred by: Kari Rio, MD  Reason for referral : Care Coordination (transporation )  [name] is a 74 y.o. year old female who is a primary care patient of Kari Mems Delorse Limber, MD.  Reason for follow-up: Collaborative office visit with patient today for ongoing assessment and brief interventions to assist with care coordination needs.   Assessment: Patient is pleasant and engaged in conversation. Informed LCSW the APS worker has closed her case.  Called [name] APS worker [phone] for update, she confirmed the APC case was closed effective 11/17/19. ( patient now has a new hot water heater, grandsons live with her, they are working to get a new furnace and have made improvements in the home.)  APS worker will call [name] agency to see if they will return to assist patient. LCSW also provided patient with incontinent samples and Ensure samples( approved by Dr. Owens Shark) during this visit.   Review of patient status, including review of consultants reports, relevant laboratory and other test results, and collaboration with appropriate care team members and the patient's provider was performed as part of comprehensive patient evaluation and provision of care management services.    Advance Directive Status: Not addressed during this encounter       SDOH (Social Determinants of Health) assessments performed: Yes   Goals Addressed            This Visit's Progress   . transportation   On track    [name] (see longitudinal plan of care for additional care plan information) Current Barriers & Progress:  . Patient with HTN and memory concerns  needs community resources for transportation,  . Acknowledges deficits and needs support, education and care coordination in order to meet this unmet need  . Lacks knowledge of community resource:    . Patient has established transportation with [facility] for all medical appointments . SCAT application completed and signed by patient and PCP Clinical Goal(s)  . Over the next 30 days, patient will have all transportation needs met for medical and other appointment by work with LCSW to complete SCAT applications Interventions provided by LCSW:  . Assessed patient's care coordination needs and discussed ongoing care management follow up  . Provided patient with information about SCAT program . Assisted patient with completing part A of SCAT application placed part B in mailbox for PCP . SCAT application faxed for patient.  Copy given to patient . Collaborated with CCM RN care team members regarding patient needs Patient Self Care Activities & Deficits:  . Patient is unable to independently navigate community resource options without care coordination support  . Acknowledges deficits and is motivated to resolve concern  Please see past updates related to this goal by clicking on the "Past Updates" button in the selected goal           Outpatient Encounter Medications as of 11/18/2019  Medication Sig  . acetaminophen (TYLENOL) 325 MG tablet Take 2 tablets (650 mg total) by mouth every 6 (six) hours.  . Blood Glucose Monitoring Suppl (ONETOUCH VERIO) w/Device KIT Check sugar 3 times per day  . feeding supplement, ENSURE ENLIVE, (ENSURE ENLIVE) LIQD Take 237 mLs by mouth 3 (three) times daily between meals.  . furosemide (LASIX) 40 MG tablet Take 1 tablet (40 mg total) by mouth daily.  Marland Kitchen gabapentin (NEURONTIN) 100 MG capsule Take 1  capsule (100 mg total) by mouth every 8 (eight) hours.  Marland Kitchen glucose blood (ONETOUCH VERIO) test strip Check blood sugar daily  . Lancet Devices (MICROLET NEXT LANCING DEVICE) MISC 1 each by Does not apply route 3 (three) times daily. Check blood sugar 3 times daily  . levothyroxine (SYNTHROID) 150 MCG tablet Take 1 tablet (150 mcg total) by mouth daily.  [name] DELICA  LANCETS 61Y MISC Check blood sugar once daily  . PROAIR HFA 108 (90 Base) MCG/ACT inhaler INHALE 2 PUFFS INTO THE LUNGS EVERY 6 HOURS AS NEEDED FOR WHEEZING OR SHORTNESS OF BREATH  . rosuvastatin (CRESTOR) 20 MG tablet Take 1 tablet (20 mg total) by mouth daily.   No facility-administered encounter medications on file as of 11/18/2019.   Plan: LCSW will follow up with patient in the next 7 to 10 days for ongoing assessment of needs   [name], [place] / [place]   [phone] 2:14 PM

## 2019-11-18 NOTE — Progress Notes (Signed)
    SUBJECTIVE:   CHIEF COMPLAINT / HPI:   [name] is a pleasant 74 year old with history of PVD, type 2 diabetes, hypothyroidism, and rheumatoid arthritis presenting today for check in after a prolonged hospital -> SNF stay. She was seen last week and was having some issues with obtaining food. Since that time she reports she has return of hot water. She reports she has access to enough food. Living with two adult grandsons. Both have young children and spend time with their own families. They help with her transfers. She is able to bathe, dress, clean herself. She feels she is having more difficulty transferring, particularly to toilet. She feels her UE strength and skill with transfer has declined since hospital stay.   Yesterday, she saw vascular surgery and had her staples removed.   Today, she has no concerns. She has refills on all of her medications. Medications reviewed and updated. Denies difficulty eating, chest pain, dyspnea, or difficulty with bowels.    PERTINENT  PMH / PSH/Family/Social History : PVD, type 2 diabetes (A1C 6.0), dyslipidemia, bilateral AKA   OBJECTIVE:   BP 138/72   Pulse 73   SpO2 99%   HEENT: Sclera anicteric. Dentition is poor. Appears well hydrated. Neck: Supple Cardiac: Regular rate and rhythm. Normal S1/S2. Lungs: Coarse bilateral breath sounds  Extremities:bilateral AKA, clean, dry, intact, no staples, no wound breakdown  Skin: Warm, dry, slight xerosis of left AKA stump  Psych: Pleasant and appropriate   ASSESSMENT/PLAN:   Hypothyroidism Repeat TSH today.   Disorder of bone and cartilage Last DEXA in 2008--consider repeat in future.   S/P AKA (above knee amputation) bilateral (HCC) Feels she has had decline in UE strength at home--- reports her grandsons typically just lift her now. Home health PT to work on transfers at home and see if additional devices are needed. No recent falls.  Referral placed to Home Health PT   Iron  deficiency anemia Taking iron (ferrous sulfate once per day in medication bag), repeat CBC today.   Food insecurity Improved, but still some concern. Has food stamps. Box of Glucerna given today (she reports she loves Ensure and Glucerna).  [name], LCSW also saw patient, will try to establish meals on wheels.  Care Coordination Scheduled virtual check in with PCP in 3 weeks Encouraged patient to apply for Medicaid--she may require assistance with this--discussed with grandson  Dorris Singh, MD  [place]

## 2019-11-19 ENCOUNTER — Telehealth: Payer: Self-pay | Admitting: Family Medicine

## 2019-11-19 LAB — CBC
Hematocrit: 29 % — ABNORMAL LOW (ref 34.0–46.6)
Hemoglobin: 9.2 g/dL — ABNORMAL LOW (ref 11.1–15.9)
MCH: 28.2 pg (ref 26.6–33.0)
MCHC: 31.7 g/dL (ref 31.5–35.7)
MCV: 89 fL (ref 79–97)
Platelets: 382 10*3/uL (ref 150–450)
RBC: 3.26 x10E6/uL — ABNORMAL LOW (ref 3.77–5.28)
RDW: 13.5 % (ref 11.7–15.4)
WBC: 11 10*3/uL — ABNORMAL HIGH (ref 3.4–10.8)

## 2019-11-19 LAB — BASIC METABOLIC PANEL
BUN/Creatinine Ratio: 25 (ref 12–28)
BUN: 22 mg/dL (ref 8–27)
CO2: 19 mmol/L — ABNORMAL LOW (ref 20–29)
Calcium: 9.2 mg/dL (ref 8.7–10.3)
Chloride: 103 mmol/L (ref 96–106)
Creatinine, Ser: 0.88 mg/dL (ref 0.57–1.00)
GFR calc Af Amer: 75 mL/min/{1.73_m2} (ref >59)
GFR calc non Af Amer: 65 mL/min/{1.73_m2} (ref >59)
Glucose: 104 mg/dL — ABNORMAL HIGH (ref 65–99)
Potassium: 4.3 mmol/L (ref 3.5–5.2)
Sodium: 141 mmol/L (ref 134–144)

## 2019-11-19 LAB — TSH: TSH: 0.815 u[IU]/mL (ref 0.450–4.500)

## 2019-11-19 MED ORDER — LEVOTHYROXINE SODIUM 150 MCG PO TABS: 150.0000 ug | ORAL_TABLET | Freq: Every day | ORAL | 3 refills | Status: DC

## 2019-11-19 NOTE — Telephone Encounter (Signed)
Called patient with results.   TSH normal, refilled levothyroxine for 1 year.  BMP relatively normal, mild non gapped acidosis, monitor. Anemia, normocytic, at baseline. Reviewed prior labs---history of markedly elevated ferritin with low saturation, suggestive of mixed ACD and IDA---already on oral iron therapy. Consider evaluation for blood loss in future (colonoscopy) pending patient's goals and wishes. Discussed above results. She is going to continue to iron for now, all questions answered.  Dorris Singh, MD  Family Medicine Teaching Service

## 2019-11-20 NOTE — Patient Instructions (Signed)
Visit Information  Goals Addressed            This Visit's Progress   . " I am confused with having my legs amputatated and trying to cope" (pt-stated)       CARE PLAN ENTRY (see longtitudinal plan of care for additional care plan information)  Current Barriers:  . Chronic Disease Management support and education needs/crisis intervention in patient with bilateral below the knee amputation, Iron deficiency anemia, and community resources/urgent housing crisis  . Knowledge Deficits related to imbalanced nutrition less than body requirements as evidenced by Moderate protein-calorie malnutrition  Nurse Case Manager Clinical Goal(s):  Marland Kitchen Over the next 30 days, patient will verbalize understanding of plan  Interventions:  . Evaluation of current treatment plan related patient's adherence to plan as established by provider. . Provided education to patient re: nutrition and wound healing . Collaborated with SW regarding transportation . Discussed plans with patient for ongoing care management follow up and provided patient with direct contact information for care management team . Patient stated that [facility] came to her home and she did not have hot water or heat.  They called APS and she had 30 days to fix the problem.The patient states that she has lived in the home and has not had hot water or heat for over 2 years and done well.  She has spoken to people to fix the problem and it will cost 6k.  She has her grandsons that are staying with her.  They provide food for her.  They are mainly eating out.  She states that she does have canned food in the home but nothing in the frig.  She states that she is confused with all of the people she has spoken with and can't remember names.  Social Worker [name] was on the phone by speaker in the room talking with the patient . Patient was given depends for incontinence,  a box of food and a scat applications for transportation. I will f/u with the patient  on 11/15/19 . 11/15/19 . Spoke with Mrs Aurich and she states that APS came to the home on 11/11/19.  She said that she thinks the workers name was Camile.  The patient states that she was asked how long it would take for her to leave the home.  The patient was talking very low and it was hard to understand what she was saying. . She stated that her son bought her a water heater and someone was there putting it in.  I asked to speak with him.  His name was Cecilie Lowers and he was putting in a new water heater.  He stated he worked with [employer] home and office. . Patient states that her sons are still buying her meals . Marland Kitchen We again discussed importance of eating and wound healing.  I am sending her information on nutrition. . The patient states that she needs to look into getting new dentures.  She states that hers have a crack in them and they were helping her when she was in the SNF but she doesn't know what happened since she left   . Patient Self Care Activities:  . Patient verbalizes understanding of plan  . Attends all scheduled provider appointments . Calls provider office for new concerns or questions . Unable to independently self manage community resources  Please see past updates related to this goal by clicking on the "Past Updates" button in the selected goal  Ms. Paradiso was given information about Care Management services today including:  1. Care Management services include personalized support from designated clinical staff supervised by her physician, including individualized plan of care and coordination with other care providers 2. 24/7 contact phone numbers for assistance for urgent and routine care needs. 3. The patient may stop CCM services at any time (effective at the end of the month) by phone call to the office staff.  Patient agreed to services and verbal consent obtained.   The patient verbalized understanding of instructions provided today and declined a print copy  of patient instruction materials.   The care management team will reach out to the patient again over the next 14 days.  The patient has been provided with contact information for the care management team and has been advised to call with any health related questions or concerns.   [name] RN, BSN, [facility] Care Management Coordinator [place] Phone: [phone] Fax: [phone]

## 2019-11-20 NOTE — Chronic Care Management (AMB) (Signed)
Care Management   Follow Up Note   11/20/2019 Name: [name] MRN: [id number] DOB: 1945-10-30  Referred by: [name], MD Reason for referral : Care Coordination (Care Management RNCM Crisis intervention F/U)   [name] is a 74 y.o. year old female who is a primary care patient of [name], MD. The care management team was consulted for assistance with care management and care coordination needs.    Review of patient status, including review of consultants reports, relevant laboratory and other test results, and collaboration with appropriate care team members and the patient's provider was performed as part of comprehensive patient evaluation and provision of chronic care management services.    SDOH (Social Determinants of Health) assessments performed: No See Care Plan activities for detailed interventions related to [facility])     Advanced Directives: See Care Plan and Vynca application for related entries.   Goals Addressed            This Visit's Progress   . " I am confused with having my legs amputatated and trying to cope" (pt-stated)       CARE PLAN ENTRY (see longtitudinal plan of care for additional care plan information)  Current Barriers:  . Chronic Disease Management support and education needs/crisis intervention in patient with bilateral below the knee amputation, Iron deficiency anemia, and community resources/urgent housing crisis  . Knowledge Deficits related to imbalanced nutrition less than body requirements as evidenced by Moderate protein-calorie malnutrition  Nurse Case Manager Clinical Goal(s):  Marland Kitchen Over the next 30 days, patient will verbalize understanding of plan  Interventions:  . Evaluation of current treatment plan related patient's adherence to plan as established by provider. . Provided education to patient re: nutrition and wound healing . Collaborated with SW regarding transportation . Discussed plans with patient  for ongoing care management follow up and provided patient with direct contact information for care management team . Patient stated that [facility] came to her home and she did not have hot water or heat.  They called APS and she had 30 days to fix the problem.The patient states that she has lived in the home and has not had hot water or heat for over 2 years and done well.  She has spoken to people to fix the problem and it will cost 6k.  She has her grandsons that are staying with her.  They provide food for her.  They are mainly eating out.  She states that she does have canned food in the home but nothing in the frig.  She states that she is confused with all of the people she has spoken with and can't remember names.  Social Worker [name] was on the phone by speaker in the room talking with the patient . Patient was given depends for incontinence,  a box of food and a scat applications for transportation. I will f/u with the patient on 11/15/19 . 11/15/19 . Spoke with Mrs Weider and she states that APS came to the home on 11/11/19.  She said that she thinks the workers name was Camile.  The patient states that she was asked how long it would take for her to leave the home.  The patient was talking very low and it was hard to understand what she was saying. . She stated that her son bought her a water heater and someone was there putting it in.  I asked to speak with him.  His name was Cecilie Lowers  and he was putting in a new water heater.  He stated he worked with [employer] home and office. . Patient states that her sons are still buying her meals . Marland Kitchen We again discussed importance of eating and wound healing.  I am sending her information on nutrition. . The patient states that she needs to look into getting new dentures.  She states that hers have a crack in them and they were helping her when she was in the SNF but she doesn't know what happened since she left   . Patient Self Care Activities:  . Patient  verbalizes understanding of plan  . Attends all scheduled provider appointments . Calls provider office for new concerns or questions . Unable to independently self manage community resources  Please see past updates related to this goal by clicking on the "Past Updates" button in the selected goal          The care management team will reach out to the patient again over the next 14 days.  The patient has been provided with contact information for the care management team and has been advised to call with any health related questions or concerns.   [name] RN, BSN, [facility] Care Management Coordinator [place] Phone: [phone] Fax: [phone]

## 2019-11-23 ENCOUNTER — Telehealth: Payer: Medicare Other

## 2019-11-26 ENCOUNTER — Other Ambulatory Visit: Payer: Self-pay

## 2019-11-26 ENCOUNTER — Ambulatory Visit: Payer: Medicare Other

## 2019-11-26 ENCOUNTER — Ambulatory Visit: Payer: Self-pay | Admitting: Licensed Clinical Social Worker

## 2019-11-26 NOTE — Chronic Care Management (AMB) (Signed)
Care Management   Follow Up Note   11/26/2019 Name: [name] MRN: [id number] DOB: 07-21-1946  Referred by: [name], MD Reason for referral : Care Coordination (Care Management RNCM F/U )   [name] is a 74 y.o. year old female who is a primary care patient of [name], MD. The care management team was consulted for assistance with care management and care coordination needs.    Review of patient status, including review of consultants reports, relevant laboratory and other test results, and collaboration with appropriate care team members and the patient's provider was performed as part of comprehensive patient evaluation and provision of chronic care management services.    SDOH (Social Determinants of Health) assessments performed: No See Care Plan activities for detailed interventions related to [name])     Advanced Directives: See Care Plan and Vynca application for related entries.   Goals Addressed            This Visit's Progress   . " I am confused with having my legs amputatated and trying to cope" (pt-stated)       CARE PLAN ENTRY (see longtitudinal plan of care for additional care plan information)  Current Barriers:  . Chronic Disease Management support and education needs/crisis intervention in patient with bilateral below the knee amputation, Iron deficiency anemia, and community resources/urgent housing crisis  . Knowledge Deficits related to imbalanced nutrition less than body requirements as evidenced by Moderate protein-calorie malnutrition  Nurse Case Manager Clinical Goal(s):  Marland Kitchen Over the next 30 days, patient will verbalize understanding of plan  Interventions:  . Evaluation of current treatment plan related patient's adherence to plan as established by provider. . Provided education to patient re: nutrition and wound healing . Collaborated with SW regarding transportation . Discussed plans with patient for ongoing care  management follow up and provided patient with direct contact information for care management team . Patient stated that [facility] came to her home and she did not have hot water or heat.  They called APS and she had 30 days to fix the problem.The patient states that she has lived in the home and has not had hot water or heat for over 2 years and done well.  She has spoken to people to fix the problem and it will cost 6k.  She has her grandsons that are staying with her.  They provide food for her.  They are mainly eating out.  She states that she does have canned food in the home but nothing in the frig.  She states that she is confused with all of the people she has spoken with and can't remember names.  Social Worker [name] was on the phone by speaker in the room talking with the patient . Patient was given depends for incontinence,  a box of food and a scat applications for transportation. I will f/u with the patient on 11/15/19 . 11/15/19 . Spoke with Mrs Caprio and she states that APS came to the home on 11/11/19.  She said that she thinks the workers name was Camile.  The patient states that she was asked how long it would take for her to leave the home.  The patient was talking very low and it was hard to understand what she was saying. . She stated that her son bought her a water heater and someone was there putting it in.  I asked to speak with him.  His name was [name] and  he was putting in a new water heater.  He stated he worked with [employer] home and office. . Patient states that her sons are still buying her meals . Marland Kitchen We again discussed importance of eating and wound healing.  I am sending her information on nutrition. . The patient states that she needs to look into getting new dentures.  She states that hers have a crack in them and they were helping her when she was in the SNF but she doesn't know what happened since she left . 11/26/19 . Spoke with Mrs. Hughson and she states that she is doing   well.  She states that APS worker has released her.  She has not heard anything from [facility]. . I followed up with [name] LCSW and APS did release the patient.  I called [facility] to see if they were notified by APS.  The receptionist was unsure of the information and left a message for the case manager to give me a call back. . The patient was also concerned about her moderna vaccine that she received while at the [facility] center.  I called and left a message for Mrs. Fonnie Mu at [phone] ext [phone].  She called me back and left a message that the patient had her shot on 10/27/19 given by [facility] and she could fax the information to me.  I called back and asked her to fax the information to [phone]. . The patient was called and notified of the information  . She states that she has all of her medications and taking them . Her grandson [name] is in the home and they have food and eat out   . Patient Self Care Activities:  . Patient verbalizes understanding of plan  . Attends all scheduled provider appointments . Calls provider office for new concerns or questions . Unable to independently self manage community resources  Please see past updates related to this goal by clicking on the "Past Updates" button in the selected goal          The care management team will reach out to the patient again over the next 7 days.  The patient has been provided with contact information for the care management team and has been advised to call with any health related questions or concerns.   [name] RN, BSN, [facility] Care Management Coordinator [place] Phone: [phone] Fax: [phone]

## 2019-11-26 NOTE — Chronic Care Management (AMB) (Signed)
    Clinical Social Work  Care Management Outreach   11/26/2019 Name: LEIANNA BARGA MRN: [id number] DOB: [birth year]  Aubriella SUMIRE HALBLEIB is a 74 y.o. year old female who is a primary care patient of [name], MD .   LCSW reached out to [name] today by phone to discuss meals on wheels.  Outreach was unsuccessful.  A HIPPA compliant phone message was left for the patient providing contact information and requesting a return call.   Review of patient status, including review of consultants reports, relevant laboratory and other test results, and collaboration with appropriate care team members and the patient's provider was performed as part of comprehensive patient evaluation and provision of care management services.    Follow Up Plan: LCSW will call again in 3 to 5 days if no return call is received.  [name], LCSW Clinical Social Worker [place] / [place]   [phone] 3:45 PM

## 2019-11-26 NOTE — Patient Instructions (Signed)
Visit Information  Goals Addressed            This Visit's Progress   . " I am confused with having my legs amputatated and trying to cope" (pt-stated)       CARE PLAN ENTRY (see longtitudinal plan of care for additional care plan information)  Current Barriers:  . Chronic Disease Management support and education needs/crisis intervention in patient with bilateral below the knee amputation, Iron deficiency anemia, and community resources/urgent housing crisis  . Knowledge Deficits related to imbalanced nutrition less than body requirements as evidenced by Moderate protein-calorie malnutrition  Nurse Case Manager Clinical Goal(s):  Marland Kitchen Over the next 30 days, patient will verbalize understanding of plan  Interventions:  . Evaluation of current treatment plan related patient's adherence to plan as established by provider. . Provided education to patient re: nutrition and wound healing . Collaborated with SW regarding transportation . Discussed plans with patient for ongoing care management follow up and provided patient with direct contact information for care management team . Patient stated that [facility] came to her home and she did not have hot water or heat.  They called APS and she had 30 days to fix the problem.The patient states that she has lived in the home and has not had hot water or heat for over 2 years and done well.  She has spoken to people to fix the problem and it will cost 6k.  She has her grandsons that are staying with her.  They provide food for her.  They are mainly eating out.  She states that she does have canned food in the home but nothing in the frig.  She states that she is confused with all of the people she has spoken with and can't remember names.  Social Worker [name] was on the phone by speaker in the room talking with the patient . Patient was given depends for incontinence,  a box of food and a scat applications for transportation. I will f/u with the patient  on 11/15/19 . 11/15/19 . Spoke with Mrs Girten and she states that APS came to the home on 11/11/19.  She said that she thinks the workers name was Camile.  The patient states that she was asked how long it would take for her to leave the home.  The patient was talking very low and it was hard to understand what she was saying. . She stated that her son bought her a water heater and someone was there putting it in.  I asked to speak with him.  His name was Cecilie Lowers and he was putting in a new water heater.  He stated he worked with [employer] home and office. . Patient states that her sons are still buying her meals . Marland Kitchen We again discussed importance of eating and wound healing.  I am sending her information on nutrition. . The patient states that she needs to look into getting new dentures.  She states that hers have a crack in them and they were helping her when she was in the SNF but she doesn't know what happened since she left . 11/26/19 . Spoke with Mrs. Calmes and she states that she is doing  well.  She states that APS worker has released her.  She has not heard anything from [place]. . I followed up with [name] LCSW and APS did release the patient.  I called [facility] to see if they were notified by APS.  The receptionist was unsure of the information and left a message for the case manager to give me a call back. . The patient was also concerned about her moderna vaccine that she received while at the [place] center.  I called and left a message for Mrs. Fonnie Mu at [phone].  She called me back and left a message that the patient had her shot on 10/27/19 given by [facility] and she could fax the information to me.  I called back and asked her to fax the information to [phone]. . The patient was called and notified of the information  . She states that she has all of her medications and taking them . Her grandson Rosalio Macadamia is in the home and they have food and eat  out   . Patient Self Care Activities:  . Patient verbalizes understanding of plan  . Attends all scheduled provider appointments . Calls provider office for new concerns or questions . Unable to independently self manage community resources  Please see past updates related to this goal by clicking on the "Past Updates" button in the selected goal         Ms. Tibbits was given information about Care Management services today including:  1. Care Management services include personalized support from designated clinical staff supervised by her physician, including individualized plan of care and coordination with other care providers 2. 24/7 contact phone numbers for assistance for urgent and routine care needs. 3. The patient may stop CCM services at any time (effective at the end of the month) by phone call to the office staff.  Patient agreed to services and verbal consent obtained.   The patient verbalized understanding of instructions provided today and declined a print copy of patient instruction materials.   The care management team will reach out to the patient again over the next 7 days.  The patient has been provided with contact information for the care management team and has been advised to call with any health related questions or concerns.   [name] RN, BSN, [facility] Care Management Coordinator [place] Phone: [phone] Fax: [phone]

## 2019-11-30 ENCOUNTER — Ambulatory Visit: Payer: Self-pay | Admitting: Licensed Clinical Social Worker

## 2019-11-30 ENCOUNTER — Ambulatory Visit: Payer: Medicare Other | Admitting: Licensed Clinical Social Worker

## 2019-11-30 DIAGNOSIS — Z7189 Other specified counseling: Secondary | ICD-10-CM

## 2019-11-30 DIAGNOSIS — Z139 Encounter for screening, unspecified: Secondary | ICD-10-CM

## 2019-11-30 NOTE — Chronic Care Management (AMB) (Signed)
   Social Work Care Management  2nd Unsuccessful  Phone Outreach   11/30/2019 Name: [name] MRN: [id number] DOB: Sep 12, 1945  Referred by: [name], MD ,  Reason for referral : Care Coordination (F/U call)   [name] is a 74 y.o. year old female who sees [name], Delorse Limber, MD for primary care. 2nd unsuccessful telephone outreach attempt to [name] today for assessment of ongoing needs for meals on wheels program.  A HIPPA compliant phone message was left for the patient providing contact information and requesting a return call.  Plan: LCSW will wait for return call, if no return call is received, will reach out to [name] again over the next 7 days.  [name], [place] / [place]   [phone] 2:21 PM

## 2019-11-30 NOTE — Patient Instructions (Signed)
Licensed Clinical Social Worker Visit Information Ms. Venti  it was nice speaking with you. Please call me directly if you have questions [phone] Goals we discussed today:  Goals Addressed            This Visit's Progress   . I need a wheelchair ramp       CARE PLAN ENTRY (see longitudinal plan of care for additional care plan information)  Current Barriers:  . Patient needs community resources to assist with wheelchair ramp  . Acknowledges deficits and needs support, education and care coordination in order to meet this unmet need  Clinical Goal(s)  . Over the next 160 days, patient will work with LCSW to address concerns related to wheelchair ramp Interventions provided by LCSW:  . Assessed patient's care coordination needs  . Provided patient with information about agencies that assist with home repair and wheelchair ramps  . Provided patient with contact information for wheelchair ramps with [employer] and [employer] also received consent to submit referral for both via [employer] Patient Self Care Activities & Deficits:  . Patient is unable to independently navigate community resource options without care coordination support  . Acknowledges deficits and is motivated to resolve concern  . Patient will also call to follow up on referral Initial goal documentation      . meals on wheels       CARE PLAN ENTRY (see longitudinal plan of care for additional care plan information)  Current Barriers:  . Patient needs community resources for meals on wheels . Acknowledges deficits and needs support, education and care coordination in order to meet this unmet need  . Patient is unable to prepare meals and rely's on grandson to bring her fast food Clinical Goal(s)  . Over the next 120 days patient will have food needs met by meals on wheels Interventions provided by LCSW:  . Assessed patient's care coordination needs  . Provided patient with  information about meals on wheels program . Obtained consent from patient to submit meals on wheels referral via [employer] . Collaborated with appropriate clinical care team members regarding patient needs Patient Self Care Activities & Deficits:  . Patient is unable to independently navigate community resource options without care coordination support  . Acknowledges deficits and is motivated to resolve concern  Initial goal documentation        . transportation   On track    [place] (see longitudinal plan of care for additional care plan information) Current Barriers & Progress:  . Patient with HTN and memory concerns  needs community resources for transportation,  . Acknowledges deficits and needs support, education and care coordination in order to meet this unmet need  . Lacks knowledge of community resource:   . Patient has established transportation with [facility] for all medical appointments . SCAT application completed and signed by patient and PCP Clinical Goal(s)  . Over the next 30 days, patient will have all transportation needs met for medical and other appointment by work with LCSW to complete SCAT applications Interventions provided by LCSW:  . Assessed patient's care coordination needs and discussed ongoing care management follow up  . Provided patient with information about SCAT program . Assisted patient with completing part A of SCAT application placed part B in mailbox for PCP . SCAT application faxed for patient.  Copy given to patient . Collaborated with CCM RN care team members regarding patient needs Patient Self Care Activities & Deficits:  .  Patient is unable to independently navigate community resource options without care coordination support  . Acknowledges deficits and is motivated to resolve concern  Please see past updates related to this goal by clicking on the "Past Updates" button in the selected goal        Materials provided: Verbal education  about resources and referrals  provided by phone Ms. Hannan received Care Management services today:  1. Care Management services include personalized support from designated clinical staff supervised by her physician, including individualized plan of care and coordination with other care providers 2. 24/7 contact [phone] for assistance for urgent and routine care needs. 3. Care Management are voluntary services and be declined at any time by calling the office.  Patient  verbally agreed to assistance and services provided by embedded care coordination/care management team today.   Patient verbalizes understanding of instructions provided today.  Follow up plan:  SW will follow up with patient by phone over the next 2 weeks  Maurine Cane, LCSW

## 2019-11-30 NOTE — Chronic Care Management (AMB) (Signed)
Care Management   Clinical Social Work Follow Up   11/30/2019 Name: [name] MRN: [id number] DOB: 01/02/1946  Referred by: McIntyre, Brittany J, MD  Reason for referral : Care Coordination (SDOH F/U)  [name] is a 74 y.o. year old female who is a primary care patient of McIntyre, Brittany J, MD.  Reason for follow-up: Phone encounter with patient today for ongoing assessment and brief interventions to assist with care coordination needs.   Assessment: Patient continues to experience experience difficulty with locating a site for her 2nd Covid vaccine. LCSW check locations for Moderna vaccine with no availability in [place]. Patient's family and CCM RN will continue to assist with scheduling an appointment.    LCSW also assessed additional needs Recommendation: Patient may benefit from, and is in agreement to be referred via [employer] to Meals on Wheels to meet  Food needs, community housing solutions and and [facility] for a wheelchair ramp. Intervention:  Review of patient status, including review of consultants reports, relevant laboratory and other test results, and collaboration with appropriate care team members and the patient's provider was performed as part of comprehensive patient evaluation and provision of care management services.    SDOH (Social Determinants of Health) assessments performed: Yes  Goals Addressed            This Visit's Progress   . I need a wheelchair ramp       CARE PLAN ENTRY (see longitudinal plan of care for additional care plan information)  Current Barriers:  . Patient needs community resources to assist with wheelchair ramp  . Acknowledges deficits and needs support, education and care coordination in order to meet this unmet need  Clinical Goal(s)  . Over the next 160 days, patient will work with LCSW to address concerns related to wheelchair ramp Interventions provided by LCSW:  . Assessed patient's care  coordination needs  . Provided patient with information about agencies that assist with home repair and wheelchair ramps  . Provided patient with contact information for wheelchair ramps with [employer] and [facility] also received consent to submit referral for both via [employer] Patient Self Care Activities & Deficits:  . Patient is unable to independently navigate community resource options without care coordination support  . Acknowledges deficits and is motivated to resolve concern  . Patient will also call to follow up on referral Initial goal documentation     . meals on wheels       CARE PLAN ENTRY (see longitudinal plan of care for additional care plan information)  Current Barriers:  . Patient needs community resources for meals on wheels . Acknowledges deficits and needs support, education and care coordination in order to meet this unmet need  . Patient is unable to prepare meals and rely's on grandson to bring her fast food Clinical Goal(s)  . Over the next 120 days patient will have food needs met by meals on wheels Interventions provided by LCSW:  . Assessed patient's care coordination needs  . Provided patient with information about meals on wheels program . Obtained consent from patient to submit meals on wheels referral via [employer] . Collaborated with appropriate clinical care team members regarding patient needs Patient Self Care Activities & Deficits:  . Patient is unable to independently navigate community resource options without care coordination support  . Acknowledges deficits and is motivated to resolve concern  Initial goal documentation     . transportation     On track    CARE PLAN ENTRY (see longitudinal plan of care for additional care plan information) Current Barriers & Progress:  . Patient with HTN and memory concerns  needs community resources for transportation,  . Acknowledges deficits and needs support, education  and care coordination in order to meet this unmet need  . Lacks knowledge of community resource:   . Patient has established transportation with [employer] for all medical appointments . SCAT application completed and signed by patient and PCP Clinical Goal(s)  . Over the next 30 days, patient will have all transportation needs met for medical and other appointment by work with LCSW to complete SCAT applications Interventions provided by LCSW:  . Assessed patient's care coordination needs and discussed ongoing care management follow up  . Provided patient with information about SCAT program . Assisted patient with completing part A of SCAT application placed part B in mailbox for PCP . SCAT application faxed for patient.  Copy given to patient . Collaborated with CCM RN care team members regarding patient needs Patient Self Care Activities & Deficits:  . Patient is unable to independently navigate community resource options without care coordination support  . Acknowledges deficits and is motivated to resolve concern  Please see past updates related to this goal by clicking on the "Past Updates" button in the selected goal        Outpatient Encounter Medications as of 11/30/2019  Medication Sig  . acetaminophen (TYLENOL) 325 MG tablet Take 2 tablets (650 mg total) by mouth every 6 (six) hours.  . Blood Glucose Monitoring Suppl (ONETOUCH VERIO) w/Device KIT Check sugar 3 times per day  . feeding supplement, ENSURE ENLIVE, (ENSURE ENLIVE) LIQD Take 237 mLs by mouth 3 (three) times daily between meals.  . ferrous sulfate 324 MG TBEC Take 1 tablet (324 mg total) by mouth daily with breakfast.  . furosemide (LASIX) 40 MG tablet Take 1 tablet (40 mg total) by mouth daily.  . gabapentin (NEURONTIN) 100 MG capsule Take 1 capsule (100 mg total) by mouth every 8 (eight) hours.  . glucose blood (ONETOUCH VERIO) test strip Check blood sugar daily  . Lancet Devices (MICROLET NEXT LANCING DEVICE) MISC 1 each by  Does not apply route 3 (three) times daily. Check blood sugar 3 times daily  . levothyroxine (SYNTHROID) 150 MCG tablet Take 1 tablet (150 mcg total) by mouth daily.  . ONETOUCH DELICA LANCETS 33G MISC Check blood sugar once daily  . PROAIR HFA 108 (90 Base) MCG/ACT inhaler INHALE 2 PUFFS INTO THE LUNGS EVERY 6 HOURS AS NEEDED FOR WHEEZING OR SHORTNESS OF BREATH  . rosuvastatin (CRESTOR) 20 MG tablet Take 1 tablet (20 mg total) by mouth daily.   No facility-administered encounter medications on file as of 11/30/2019.   Plan: LCSW will F/U with patient in 2 weeks   , LCSW Chronic Care Coordination  [facility]   [phone] 3:52 PM 

## 2019-12-01 ENCOUNTER — Ambulatory Visit: Payer: Self-pay

## 2019-12-01 ENCOUNTER — Other Ambulatory Visit: Payer: Self-pay | Admitting: Family Medicine

## 2019-12-02 DIAGNOSIS — S78111D Complete traumatic amputation at level between right hip and knee, subsequent encounter: Secondary | ICD-10-CM | POA: Diagnosis not present

## 2019-12-02 DIAGNOSIS — M6281 Muscle weakness (generalized): Secondary | ICD-10-CM | POA: Diagnosis not present

## 2019-12-02 DIAGNOSIS — I5032 Chronic diastolic (congestive) heart failure: Secondary | ICD-10-CM | POA: Diagnosis not present

## 2019-12-02 DIAGNOSIS — S78112D Complete traumatic amputation at level between left hip and knee, subsequent encounter: Secondary | ICD-10-CM | POA: Diagnosis not present

## 2019-12-03 ENCOUNTER — Telehealth: Payer: Medicare Other

## 2019-12-03 ENCOUNTER — Other Ambulatory Visit: Payer: Self-pay

## 2019-12-03 NOTE — Chronic Care Management (AMB) (Signed)
  Care Management   Follow Up Note   12/03/2019 Name: [name] MRN: [id number] DOB: [birth year]  Referred by: [name], MD Reason for referral : Care Coordination (CCM RNCM Second Moderna Shot)   [name] is a 74 y.o. year old female who is a primary care patient of Kari Mems Delorse Limber, MD. The care management team was consulted for assistance with care management and care coordination needs.    Review of patient status, including review of consultants reports, relevant laboratory and other test results, and collaboration with appropriate care team members and the patient's provider was performed as part of comprehensive patient evaluation and provision of chronic care management services.    SDOH (Social Determinants of Health) assessments performed: No See Care Plan activities for detailed interventions related to [facility])     Advanced Directives: See Care Plan and [employer] application for related entries.   Goals Addressed            This Visit's Progress   . Patient [name] (see longitudinal plan of care for additional care plan information)  Unable to acquire vaccine-patient had her 1st vaccine at [facility] and did not have any information about her shot.  She asked me to call for her. I was able to call and get a paper faxed to the office with the date of her shot. 10/27/19   Current Barriers:  . Transportation barriers . Difficulty obtaining provider   Nurse Case Manager Clinical Goal(s):  Marland Kitchen Over the next 10 days, patient will verbalize understanding of plan   Interventions:   . Advised patient to the patient that I have called to [name] and they will be able to accommodate her with a shot Collaborated with [name]  regarding Immunization . Discussed plans with patient for ongoing care management follow up and provided patient with direct contact information for care management team . Provided  patient and/or caregiver with information on appointment Tuesday 20th @ 115 pm for her 2nd shot of Moderna.  This will give her enough time to call [facility] for transportation to the site.  I gave her the address [address] in [place] and the Phone # [phone] if she has any problems.Advice worker).  Patient verbalized understanding . I will call her back on Wednesday to confirm she had her immunization.   Patient Self Care Activities:  . Patient verbalizes understanding of plan  . Unable to independently self managing scheduling vaccine administration  Initial goal documentation         The care management team will reach out to the patient again over the next 10 days.  The patient has been provided with contact information for the care management team and has been advised to call with any health related questions or concerns.   [name] RN, BSN, [facility] Care Management Coordinator [place] Phone: [phone] Fax: [phone]

## 2019-12-03 NOTE — Patient Instructions (Signed)
Visit Information  Goals Addressed            This Visit's Progress   . Patient [name] (see longitudinal plan of care for additional care plan information)  Unable to acquire vaccine-patient had her 1st vaccine at [facility] and did not have any information about her shot.  She asked me to call for her. I was able to call and get a paper faxed to the office with the date of her shot. 10/27/19   Current Barriers:  . Transportation barriers . Difficulty obtaining provider   Nurse Case Manager Clinical Goal(s):  Marland Kitchen Over the next 10 days, patient will verbalize understanding of plan   Interventions:   . Advised patient to the patient that I have called to [name] and they will be able to accommodate her with a shot Collaborated with [name]  regarding Immunization . Discussed plans with patient for ongoing care management follow up and provided patient with direct contact information for care management team . Provided patient and/or caregiver with information on appointment Tuesday 20th @ 115 pm for her 2nd shot of Moderna.  This will give her enough time to call [facility] for transportation to the site.  I gave her the address [place] in [place] and the Phone # [phone] if she has any problems.Advice worker).  Patient verbalized understanding . I will call her back on Wednesday to confirm she had her immunization.   Patient Self Care Activities:  . Patient verbalizes understanding of plan  . Unable to independently self managing scheduling vaccine administration  Initial goal documentation        [name] was given information about Care Management services today including:  1. Care Management services include personalized support from designated clinical staff supervised by her physician, including individualized plan of care and coordination with other care providers 2. 24/7 contact phone numbers for  assistance for urgent and routine care needs. 3. The patient may stop CCM services at any time (effective at the end of the month) by phone call to the office staff.  Patient agreed to services and verbal consent obtained.   The patient verbalized understanding of instructions provided today and declined a print copy of patient instruction materials.   The care management team will reach out to the patient again over the next 10 days.  The patient has been provided with contact information for the care management team and has been advised to call with any health related questions or concerns.   [name] RN, BSN, [facility] Care Management Coordinator [place] Phone: [phone] Fax: [phone]

## 2019-12-06 ENCOUNTER — Ambulatory Visit: Payer: Self-pay

## 2019-12-06 ENCOUNTER — Other Ambulatory Visit: Payer: Self-pay | Admitting: Family Medicine

## 2019-12-06 DIAGNOSIS — Z899 Acquired absence of limb, unspecified: Secondary | ICD-10-CM

## 2019-12-06 NOTE — Progress Notes (Signed)
re

## 2019-12-06 NOTE — Chronic Care Management (AMB) (Signed)
  Care Management   Follow Up Note   12/06/2019 Name: [name] MRN: [id number] DOB: 11-30-1945  Referred by: Kari Rio, MD Reason for referral : Care Coordination (Care Manangement RNCM )   [name] is a 74 y.o. year old female who is a primary care patient of Kari Mems Delorse Limber, MD. The care management team was consulted for assistance with care management and care coordination needs.    Review of patient status, including review of consultants reports, relevant laboratory and other test results, and collaboration with appropriate care team members and the patient's provider was performed as part of comprehensive patient evaluation and provision of chronic care management services.    SDOH (Social Determinants of Health) assessments performed: No See Care Plan activities for detailed interventions related to [name])     Advanced Directives: See Care Plan and Vynca application for related entries.   Goals Addressed            This Visit's Progress   . "I need my second vaccine       CARE PLAN ENTRY (see longitudinal plan of care for additional care plan information)  Unable to acquire vaccine-patient had her 1st vaccine at [name] and did not have any information about her shot.  She asked me to call for her. I was able to call and get a paper faxed to the office with the date of her shot. 10/27/19   Current Barriers:  . Transportation barriers . Difficulty obtaining provider   Nurse Case Manager Clinical Goal(s):  Marland Kitchen Over the next 10 days, patient will verbalize understanding of plan   Interventions:   . Advised patient to the patient that I have called to [name] and they will be able to accommodate her with a shot Collaborated with [name]  regarding Immunization . Discussed plans with patient for ongoing care management follow up and provided patient with direct contact information for care management team . Provided  patient and/or caregiver with information on appointment Tuesday 20th @ 115 pm for her 2nd shot of Moderna.  This will give her enough time to call [name] for transportation to the site.  I gave her the address [name] in [place] and the Phone # [phone] if she has any problems.Advice worker).  Patient verbalized understanding . I will call her back on Wednesday to confirm she had her immunization. . 12/06/19 . Called and spoke with the patient as a reminder that she is scheduled to have her second vaccination on the 20th. . Made sure that she has transportation to the site. She stated that she does and she confirmed it.  I will give her a call back to see how everything went for her   Patient Self Care Activities:  . Patient verbalizes understanding of plan  . Unable to independently self managing scheduling vaccine administration  Initial goal documentation         The care management team will reach out to the patient again over the next 10 days.  The patient has been provided with contact information for the care management team and has been advised to call with any health related questions or concerns.   [name] RN, BSN, [facility] Care Management Coordinator [name] Phone: [phone] Fax: [phone]

## 2019-12-06 NOTE — Patient Instructions (Signed)
Visit Information  Goals Addressed            This Visit's Progress   . "I need my second vaccine       CARE PLAN ENTRY (see longitudinal plan of care for additional care plan information)  Unable to acquire vaccine-patient had her 1st vaccine at [facility] and did not have any information about her shot.  She asked me to call for her. I was able to call and get a paper faxed to the office with the date of her shot. 10/27/19   Current Barriers:  . Transportation barriers . Difficulty obtaining provider   Nurse Case Manager Clinical Goal(s):  Marland Kitchen Over the next 10 days, patient will verbalize understanding of plan   Interventions:   . Advised patient to the patient that I have called to [name] and they will be able to accommodate her with a shot Collaborated with [name]  regarding Immunization . Discussed plans with patient for ongoing care management follow up and provided patient with direct contact information for care management team . Provided patient and/or caregiver with information on appointment Tuesday 20th @ 115 pm for her 2nd shot of Moderna.  This will give her enough time to call [facility] for transportation to the site.  I gave her the address [place] in [place] and the Phone # [phone] if she has any problems.Advice worker).  Patient verbalized understanding . I will call her back on Wednesday to confirm she had her immunization. . 12/06/19 . Called and spoke with the patient as a reminder that she is scheduled to have her second vaccination on the 20th. . Made sure that she has transportation to the site. She stated that she does and she confirmed it.  I will give her a call back to see how everything went for her   Patient Self Care Activities:  . Patient verbalizes understanding of plan  . Unable to independently self managing scheduling vaccine administration  Initial goal documentation        Ms. Gravlin was  given information about Care Management services today including:  1. Care Management services include personalized support from designated clinical staff supervised by her physician, including individualized plan of care and coordination with other care providers 2. 24/7 contact phone numbers for assistance for urgent and routine care needs. 3. The patient may stop CCM services at any time (effective at the end of the month) by phone call to the office staff.  Patient agreed to services and verbal consent obtained.   The patient verbalized understanding of instructions provided today and declined a print copy of patient instruction materials.   The care management team will reach out to the patient again over the next 10 days.  The patient has been provided with contact information for the care management team and has been advised to call with any health related questions or concerns.   [name] RN, BSN, [facility] Care Management Coordinator [place] Phone: [phone] Fax: [phone]

## 2019-12-07 ENCOUNTER — Telehealth (INDEPENDENT_AMBULATORY_CARE_PROVIDER_SITE_OTHER): Payer: Medicare Other | Admitting: Family Medicine

## 2019-12-07 ENCOUNTER — Other Ambulatory Visit: Payer: Self-pay

## 2019-12-07 ENCOUNTER — Ambulatory Visit: Payer: Medicare Other | Attending: Family

## 2019-12-07 DIAGNOSIS — E1152 Type 2 diabetes mellitus with diabetic peripheral angiopathy with gangrene: Secondary | ICD-10-CM

## 2019-12-07 DIAGNOSIS — R6889 Other general symptoms and signs: Secondary | ICD-10-CM | POA: Diagnosis not present

## 2019-12-07 DIAGNOSIS — Z1211 Encounter for screening for malignant neoplasm of colon: Secondary | ICD-10-CM

## 2019-12-07 DIAGNOSIS — Z23 Encounter for immunization: Secondary | ICD-10-CM

## 2019-12-07 DIAGNOSIS — E1140 Type 2 diabetes mellitus with diabetic neuropathy, unspecified: Secondary | ICD-10-CM

## 2019-12-07 NOTE — Progress Notes (Signed)
[place] Telemedicine Visit  Patient consented to have virtual visit and was identified by name and date of birth. Method of visit: Telephone  Encounter participants: Patient: [name] - located at home Provider: Chrisandra Netters - located at office Others (if applicable): n/a  Chief Complaint: follow up  HPI:  Patient seen via telemedicine visit today for routine follow up. Patient has no video equipment.  Bruise on her bottom - reports having a bruise on her bottom that is sore whenever she sits for long periods of time. Is not sure if it is actually opened up. Had been prescribed a cream to use while she was in the hospital, but does not seem to have that now.   Diabetes - last A1c 6.0. not on diabetes medications. Not checking sugars, as no monitor (lost hers). Hydrating well, urinating well on lasix 40mg  daily.  Neuropathy - taking gabapentin 100mg  q8h. This is helping well with her pain.    ROS: per HPI  Exam:  Respiratory: Patient speaking normally in full sentences throughout the encounter, without any respiratory distress evident.  Psych: affect bright and happy  Assessment/Plan:  Health maintenance: - refer to GI for colonoscopy, patient agreeable. Should get colonoscopy rather than cologuard given her chronic anemia - requested records from [facility] for eye exam - getting second COVID vaccination today at [facility] [employer]  Type 2 diabetes mellitus with diabetic neuropathy, unspecified ([place]) Doing well on gabapentin, continue Will send in new glucometer so she has one on hand   "Bruise" on bottom - suspect early stage sacral decubitus wound. Obviously not able to assess well over the phone. Patient needs at least 3 day notice before appts so she can arrange transportation. My next appointment is 9 days away, next Thursday. She thinks this will be fine. Scheduled her then. Given warning signs/return precautions including fever, drainage  from wound, worsening.  Time spent during visit with patient: 13 minutes

## 2019-12-07 NOTE — Progress Notes (Signed)
   Covid-19 Vaccination Clinic  Name:  TRENT THEISEN    MRN: [id number] DOB: May 20, 1946  12/07/2019  Ms. Ekdahl was observed post Covid-19 immunization for 15 minutes without incident. She was provided with Vaccine Information Sheet and instruction to access the V-Safe system.   Ms. Hollinsworth was instructed to call 911 with any severe reactions post vaccine: Marland Kitchen Difficulty breathing  . Swelling of face and throat  . A fast heartbeat  . A bad rash all over body  . Dizziness and weakness   Immunizations Administered    Name Date Dose VIS Date Route   Moderna COVID-19 Vaccine 12/07/2019 11:44 AM 0.5 mL 07/2019 Intramuscular   Manufacturer: Moderna   Lot: [id number]   [place]: 87199-412-90

## 2019-12-07 NOTE — Assessment & Plan Note (Addendum)
Doing well on gabapentin, continue Will send in new glucometer so she has one on hand

## 2019-12-08 ENCOUNTER — Other Ambulatory Visit: Payer: Self-pay

## 2019-12-08 ENCOUNTER — Ambulatory Visit: Payer: Medicare Other

## 2019-12-08 NOTE — Patient Instructions (Signed)
Visit Information  Goals Addressed            This Visit's Progress   . COMPLETED: "I need my second vaccine       CARE PLAN ENTRY (see longitudinal plan of care for additional care plan information)  Unable to acquire vaccine-patient had her 1st vaccine at [facility] and did not have any information about her shot.  She asked me to call for her. I was able to call and get a paper faxed to the office with the date of her shot. 10/27/19   Current Barriers:  . Transportation barriers . Difficulty obtaining provider   Nurse Case Manager Clinical Goal(s):  Marland Kitchen Over the next 10 days, patient will verbalize understanding of plan   Interventions:   . Advised patient to the patient that I have called to [name] and they will be able to accommodate her with a shot Collaborated with [name]  regarding Immunization . Discussed plans with patient for ongoing care management follow up and provided patient with direct contact information for care management team . Provided patient and/or caregiver with information on appointment Tuesday 20th @ 115 pm for her 2nd shot of Moderna.  This will give her enough time to call [facility] for transportation to the site.  I gave her the address [place] in [place] and the Phone # [phone] if she has any problems.Advice worker).  Patient verbalized understanding . I will call her back on Wednesday to confirm she had her immunization. . 12/06/19 . Called and spoke with the patient as a reminder that she is scheduled to have her second vaccination on the 20th. . Made sure that she has transportation to the site. She stated that she does and she confirmed it.  I will give her a call back to see how everything went for her . 12/08/19 . Patient states that she received her second dose of the vaccine.  She was given a laminated card.  I also notified her that a [place] referral was sent in for service to be  started.   Patient Self Care Activities:  . Patient verbalizes understanding of plan  . Unable to independently self managing scheduling vaccine administration  Please see past updates related to this goal by clicking on the "Past Updates" button in the selected goal         [name] was given information about Care Management services today including:  1. Care Management services include personalized support from designated clinical staff supervised by her physician, including individualized plan of care and coordination with other care providers 2. 24/7 contact phone numbers for assistance for urgent and routine care needs. 3. The patient may stop CCM services at any time (effective at the end of the month) by phone call to the office staff.  Patient agreed to services and verbal consent obtained.   The patient verbalized understanding of instructions provided today and declined a print copy of patient instruction materials.   The care management team will reach out to the patient again over the next 14 days.  The patient has been provided with contact information for the care management team and has been advised to call with any health related questions or concerns.   [name] RN, BSN, [facility] Care Management Coordinator [place] Phone: [phone] Fax: [phone]

## 2019-12-08 NOTE — Chronic Care Management (AMB) (Signed)
Care Management   Follow Up Note   12/08/2019 Name: [name] MRN: [id number] DOB: 1946/06/29  Referred by: [name], MD Reason for referral : Care Coordination (Care Management RNCM F/U Vaccine )   Kari SEMAJA Keller is a 74 y.o. year old female who is a primary care patient of [name], MD. The care management team was consulted for assistance with care management and care coordination needs.    Review of patient status, including review of consultants reports, relevant laboratory and other test results, and collaboration with appropriate care team members and the patient's provider was performed as part of comprehensive patient evaluation and provision of chronic care management services.    SDOH (Social Determinants of Health) assessments performed: No See Care Plan activities for detailed interventions related to [facility])     Advanced Directives: See Care Plan and Vynca application for related entries.   Goals Addressed            This Visit's Progress   . COMPLETED: "I need my second vaccine       CARE PLAN ENTRY (see longitudinal plan of care for additional care plan information)  Unable to acquire vaccine-patient had her 1st vaccine at [facility] and did not have any information about her shot.  She asked me to call for her. I was able to call and get a paper faxed to the office with the date of her shot. 10/27/19   Current Barriers:  . Transportation barriers . Difficulty obtaining provider   Nurse Case Manager Clinical Goal(s):  Marland Kitchen Over the next 10 days, patient will verbalize understanding of plan   Interventions:   . Advised patient to the patient that I have called to [name] and they will be able to accommodate her with a shot Collaborated with [name]  regarding Immunization . Discussed plans with patient for ongoing care management follow up and provided patient with direct contact information for care  management team . Provided patient and/or caregiver with information on appointment Tuesday 20th @ 115 pm for her 2nd shot of Moderna.  This will give her enough time to call [facility] for transportation to the site.  I gave her the address [place] in [place] and the Phone # [phone] if she has any problems.Advice worker).  Patient verbalized understanding . I will call her back on Wednesday to confirm she had her immunization. . 12/06/19 . Called and spoke with the patient as a reminder that she is scheduled to have her second vaccination on the 20th. . Made sure that she has transportation to the site. She stated that she does and she confirmed it.  I will give her a call back to see how everything went for her . 12/08/19 . Patient states that she received her second dose of the vaccine.  She was given a laminated card.  I also notified her that a [place] referral was sent in for service to be started.   Patient Self Care Activities:  . Patient verbalizes understanding of plan  . Unable to independently self managing scheduling vaccine administration  Please see past updates related to this goal by clicking on the "Past Updates" button in the selected goal          The care management team will reach out to the patient again over the next 14 days.  The patient has been provided with contact information for the care management team and has  been advised to call with any health related questions or concerns.   [name] RN, BSN, [facility] Care Management Coordinator [place] Phone: [phone] Fax: [phone]

## 2019-12-09 ENCOUNTER — Encounter: Payer: Self-pay | Admitting: Internal Medicine

## 2019-12-11 DIAGNOSIS — Z591 Inadequate housing: Secondary | ICD-10-CM | POA: Diagnosis not present

## 2019-12-11 DIAGNOSIS — E1142 Type 2 diabetes mellitus with diabetic polyneuropathy: Secondary | ICD-10-CM | POA: Diagnosis not present

## 2019-12-11 DIAGNOSIS — E039 Hypothyroidism, unspecified: Secondary | ICD-10-CM | POA: Diagnosis not present

## 2019-12-11 DIAGNOSIS — Z89611 Acquired absence of right leg above knee: Secondary | ICD-10-CM | POA: Diagnosis not present

## 2019-12-11 DIAGNOSIS — Z8631 Personal history of diabetic foot ulcer: Secondary | ICD-10-CM | POA: Diagnosis not present

## 2019-12-11 DIAGNOSIS — I509 Heart failure, unspecified: Secondary | ICD-10-CM | POA: Diagnosis not present

## 2019-12-11 DIAGNOSIS — N189 Chronic kidney disease, unspecified: Secondary | ICD-10-CM | POA: Diagnosis not present

## 2019-12-11 DIAGNOSIS — E1151 Type 2 diabetes mellitus with diabetic peripheral angiopathy without gangrene: Secondary | ICD-10-CM | POA: Diagnosis not present

## 2019-12-11 DIAGNOSIS — Z4781 Encounter for orthopedic aftercare following surgical amputation: Secondary | ICD-10-CM | POA: Diagnosis not present

## 2019-12-11 DIAGNOSIS — E1122 Type 2 diabetes mellitus with diabetic chronic kidney disease: Secondary | ICD-10-CM | POA: Diagnosis not present

## 2019-12-11 DIAGNOSIS — Z89612 Acquired absence of left leg above knee: Secondary | ICD-10-CM | POA: Diagnosis not present

## 2019-12-11 DIAGNOSIS — M069 Rheumatoid arthritis, unspecified: Secondary | ICD-10-CM | POA: Diagnosis not present

## 2019-12-11 DIAGNOSIS — Z8739 Personal history of other diseases of the musculoskeletal system and connective tissue: Secondary | ICD-10-CM | POA: Diagnosis not present

## 2019-12-11 DIAGNOSIS — G546 Phantom limb syndrome with pain: Secondary | ICD-10-CM | POA: Diagnosis not present

## 2019-12-11 DIAGNOSIS — I130 Hypertensive heart and chronic kidney disease with heart failure and stage 1 through stage 4 chronic kidney disease, or unspecified chronic kidney disease: Secondary | ICD-10-CM | POA: Diagnosis not present

## 2019-12-11 DIAGNOSIS — Z7951 Long term (current) use of inhaled steroids: Secondary | ICD-10-CM | POA: Diagnosis not present

## 2019-12-11 DIAGNOSIS — Z4801 Encounter for change or removal of surgical wound dressing: Secondary | ICD-10-CM | POA: Diagnosis not present

## 2019-12-11 DIAGNOSIS — Z602 Problems related to living alone: Secondary | ICD-10-CM | POA: Diagnosis not present

## 2019-12-11 DIAGNOSIS — E440 Moderate protein-calorie malnutrition: Secondary | ICD-10-CM | POA: Diagnosis not present

## 2019-12-11 DIAGNOSIS — M8580 Other specified disorders of bone density and structure, unspecified site: Secondary | ICD-10-CM | POA: Diagnosis not present

## 2019-12-11 DIAGNOSIS — Z993 Dependence on wheelchair: Secondary | ICD-10-CM | POA: Diagnosis not present

## 2019-12-15 DIAGNOSIS — I130 Hypertensive heart and chronic kidney disease with heart failure and stage 1 through stage 4 chronic kidney disease, or unspecified chronic kidney disease: Secondary | ICD-10-CM | POA: Diagnosis not present

## 2019-12-15 DIAGNOSIS — Z591 Inadequate housing: Secondary | ICD-10-CM | POA: Diagnosis not present

## 2019-12-15 DIAGNOSIS — E039 Hypothyroidism, unspecified: Secondary | ICD-10-CM | POA: Diagnosis not present

## 2019-12-15 DIAGNOSIS — N189 Chronic kidney disease, unspecified: Secondary | ICD-10-CM | POA: Diagnosis not present

## 2019-12-15 DIAGNOSIS — Z993 Dependence on wheelchair: Secondary | ICD-10-CM | POA: Diagnosis not present

## 2019-12-15 DIAGNOSIS — I509 Heart failure, unspecified: Secondary | ICD-10-CM | POA: Diagnosis not present

## 2019-12-15 DIAGNOSIS — E440 Moderate protein-calorie malnutrition: Secondary | ICD-10-CM | POA: Diagnosis not present

## 2019-12-15 DIAGNOSIS — Z4781 Encounter for orthopedic aftercare following surgical amputation: Secondary | ICD-10-CM | POA: Diagnosis not present

## 2019-12-15 DIAGNOSIS — M8580 Other specified disorders of bone density and structure, unspecified site: Secondary | ICD-10-CM | POA: Diagnosis not present

## 2019-12-15 DIAGNOSIS — E1151 Type 2 diabetes mellitus with diabetic peripheral angiopathy without gangrene: Secondary | ICD-10-CM | POA: Diagnosis not present

## 2019-12-15 DIAGNOSIS — E1122 Type 2 diabetes mellitus with diabetic chronic kidney disease: Secondary | ICD-10-CM | POA: Diagnosis not present

## 2019-12-15 DIAGNOSIS — G546 Phantom limb syndrome with pain: Secondary | ICD-10-CM | POA: Diagnosis not present

## 2019-12-15 DIAGNOSIS — Z602 Problems related to living alone: Secondary | ICD-10-CM | POA: Diagnosis not present

## 2019-12-15 DIAGNOSIS — Z7951 Long term (current) use of inhaled steroids: Secondary | ICD-10-CM | POA: Diagnosis not present

## 2019-12-15 DIAGNOSIS — Z8739 Personal history of other diseases of the musculoskeletal system and connective tissue: Secondary | ICD-10-CM | POA: Diagnosis not present

## 2019-12-15 DIAGNOSIS — Z4801 Encounter for change or removal of surgical wound dressing: Secondary | ICD-10-CM | POA: Diagnosis not present

## 2019-12-15 DIAGNOSIS — Z89612 Acquired absence of left leg above knee: Secondary | ICD-10-CM | POA: Diagnosis not present

## 2019-12-15 DIAGNOSIS — M069 Rheumatoid arthritis, unspecified: Secondary | ICD-10-CM | POA: Diagnosis not present

## 2019-12-15 DIAGNOSIS — Z89611 Acquired absence of right leg above knee: Secondary | ICD-10-CM | POA: Diagnosis not present

## 2019-12-15 DIAGNOSIS — E1142 Type 2 diabetes mellitus with diabetic polyneuropathy: Secondary | ICD-10-CM | POA: Diagnosis not present

## 2019-12-15 DIAGNOSIS — Z8631 Personal history of diabetic foot ulcer: Secondary | ICD-10-CM | POA: Diagnosis not present

## 2019-12-16 ENCOUNTER — Encounter: Payer: Self-pay | Admitting: Family Medicine

## 2019-12-16 ENCOUNTER — Ambulatory Visit (INDEPENDENT_AMBULATORY_CARE_PROVIDER_SITE_OTHER): Payer: Medicare Other | Admitting: Family Medicine

## 2019-12-16 ENCOUNTER — Other Ambulatory Visit: Payer: Self-pay

## 2019-12-16 ENCOUNTER — Ambulatory Visit: Payer: Self-pay | Admitting: Licensed Clinical Social Worker

## 2019-12-16 DIAGNOSIS — Z9189 Other specified personal risk factors, not elsewhere classified: Secondary | ICD-10-CM | POA: Insufficient documentation

## 2019-12-16 DIAGNOSIS — Z89612 Acquired absence of left leg above knee: Secondary | ICD-10-CM

## 2019-12-16 DIAGNOSIS — Z89611 Acquired absence of right leg above knee: Secondary | ICD-10-CM

## 2019-12-16 DIAGNOSIS — M059 Rheumatoid arthritis with rheumatoid factor, unspecified: Secondary | ICD-10-CM | POA: Diagnosis not present

## 2019-12-16 NOTE — Progress Notes (Signed)
  Date of Visit: 12/16/2019   SUBJECTIVE:   HPI:  [name] presents today for follow up.   RA - has ulnar deviation of bilateral hands, limiting her ability to do ADLs like washing her hands. Saw rheumatology Dr. Estanislado Pandy by telemedicine back in December and was supposed to get lots of rheum labs drawn, but was not able to get these drawn as she was dealing with severe foot wounds and eventually ended up having bilateral above the knee amputations.   Amputations - interested in getting prosthetic legs but has not gotten any information on this possibility. Is getting [facility] nursing and physical therapy services, which has been helpful. Sits in wheelchair or lies in bed most of the day.   Pressure sores - has noted soreness in her bottom from sitting all day. Had her [facility] RN and physical therapist look at it. They advised she turn multiple times per day to help prevent sores.  OBJECTIVE:   BP 125/80   Pulse 80   SpO2 100%  Gen: no acute distress, pleasant, cooperative HEENT: normocephalic, atraumatic  Lungs: normal work of breathing  Neuro: alert speech normal, grossly nonfocal Ext: bilateral AKAs, stumps well healed. Bilateral hands with ulnar deviation. Skin: small area of bruising on lower sacrum, no skin breakdown, drainage, or erythema  ASSESSMENT/PLAN:   Rheumatoid arthritis ([place]) Unfortunately we were not able to draw her rheum labs today as they were ordered by rheumatologist through [facility], not [facility], and the labs were very specialized - I do not want to reorder them and inadvertently order the incorrect test.    Attempted to schedule patient a visit with rheumatology for labs today, but was told by Rheum receptionist that she can actually just walk in for labs. Provided patient with phone number and lab hours, so she can go and get her rheum labs. Patient appreciative.  At high risk for pressure injury of skin No current signs of infection Provided patient with handout on  preventing pressure sores Advised to rotate how she is lying to prevent sores  S/P AKA (above knee amputation) bilateral ([place]) Continue home health physical therapy Will message vascular team to determine whether patient could be a candidate for prostheses, and who they would recommend she contact to get these.  Assisted patient with washing her hands in our sink, as her RA has prevented her from being able to wash her hands well independently at home.  FOLLOW UP: Follow up in 3 months with me for chronic medical issues, sooner if needed  [place] J. [name], [place] than 30 minutes were spent on this encounter on the day of service, including pre-visit planning, actual face to face time, coordination of care, and documentation of visit.

## 2019-12-16 NOTE — Patient Instructions (Addendum)
It was great to see you again today!  Turn on your side frequently and alternate how you are sitting/laying to prevent bed sores  I will check on whether you can get evaluated for prosthetic legs.  Follow up with me in 3 months, sooner if needed  Be well, Dr. [name]    Preventing Pressure Injuries  A pressure injury, sometimes called a bedsore or a pressure ulcer, is an injury to the skin and underlying tissue caused by pressure. A pressure injury can happen when your skin presses against a surface, such as a mattress or wheelchair seat, for too long. The pressure on the blood vessels causes reduced blood flow to your skin. This can eventually cause the skin tissue to die and break down into a wound. Pressure injuries usually develop:  Over bony parts of the body, such as the tailbone, shoulders, elbows, hips, and heels.  Under medical devices, such as respiratory equipment, stockings, tubes, and splints. How can this condition affect me? Pressure injuries are caused by a lack of blood supply to an area of skin. These injuries begin as a reddened area on the skin and can become an open sore. They can result from intense pressure over a short period of time or from less pressure over a long period of time. Pressure injuries can vary in severity. They can cause pain, muscle damage, and infection. What can increase my risk? This condition is more likely to develop in people who:  Are in the hospital or an extended care facility.  Are bedridden or in a wheelchair.  Have an injury or disease that keeps them from: ? Moving normally. ? Feeling pain or pressure. ? Communicating if they feel pain or pressure.  Have a condition that: ? Makes them sleepy or less alert. ? Causes poor blood flow.  Need to wear a medical device.  Have poor control of their bladder or bowel functions (incontinence).  Have poor nutrition (malnutrition).  Have had this condition before.  Are of  certain ethnicities. People of African American, Latino, or Hispanic descent are at higher risk compared to other ethnic groups. What actions can I take to prevent pressure injuries? Reducing and redistributing pressure  Do not lie or sit in one position for a long time. Move or change position: ? Every hour when out of bed in a chair. ? Every two hours when in bed. ? As often as told by your health care provider.  Use pillows, wedges, or cushions to redistribute pressure. Ask your health care provider to recommend a mattress, cushions, or pads for you.  Use medical devices that do not rub your skin. Tell your health care provider if one of your medical devices is causing pain or irritation. Skin care If you are in the hospital, your health care providers:  Will inspect your skin, including areas under or around medical devices, at least twice a day.  May recommend that you use certain types of bedding to help prevent pressure injuries. These may include a pad, mattress, or chair cushion that is filled with gel, air, water, or foam.  Will evaluate your nutrition and consult a dietitian if needed.  Will inspect and change any wound dressings regularly.  May help you move into different positions every few hours.  Will adjust any medical devices and braces as needed to limit pressure on your skin.  Will keep your skin clean and dry.  May use gentle cleansers and skin protectants if you are incontinent.  Will moisturize any dry skin. In general, at home:  Keep your skin clean and dry. Gently pat your skin dry.  Do not rub or massage bony areas of your skin.  Moisturize dry skin.  Use gentle cleansers and skin protectants routinely if you are incontinent.  Check your skin at least once a day for any changes in color and for any new blisters or sores. Make sure to check under and around any medical devices and between skin folds. Have a caregiver do this for you if you are not  able.  Lifestyle  Be as active as you can every day. Ask your health care provider to suggest safe exercises or activities.  Do not abuse drugs or alcohol.  Do not use any products that contain nicotine or tobacco, such as cigarettes, e-cigarettes, and chewing tobacco. If you need help quitting, ask your health care provider. General instructions   Take over-the-counter and prescription medicines only as told by your health care provider.  Work with your health care provider to manage any chronic health conditions.  Eat a healthy diet that includes protein, vitamins, and minerals. Ask your health care provider what types of food you should eat.  Drink enough fluid to keep your urine pale yellow.  Keep all follow-up visits as told by your health care provider. This is important. Contact a health care provider if you:  Feel or see any changes in your skin. Summary  A pressure injury, sometimes called a bedsore or a pressure ulcer, is an injury to the skin and underlying tissue caused by pressure.  Do not lie or sit in one position for a long time.  Check your skin at least once a day for any changes in color and for any new blisters or sores.  Make sure to check under and around any medical devices and between skin folds. Have a caregiver do this for you if you are not able.  Eat a healthy diet that includes protein, vitamins, and minerals. Ask your health care provider what types of food you should eat. This information is not intended to replace advice given to you by your health care provider. Make sure you discuss any questions you have with your health care provider. Document Revised: 11/27/2018 Document Reviewed: 04/28/2018 Elsevier Patient Education  [place].

## 2019-12-16 NOTE — Assessment & Plan Note (Signed)
Continue home health physical therapy Will message vascular team to determine whether patient could be a candidate for prostheses, and who they would recommend she contact to get these.

## 2019-12-16 NOTE — Assessment & Plan Note (Signed)
Unfortunately we were not able to draw her rheum labs today as they were ordered by rheumatologist through [facility], not [facility], and the labs were very specialized - I do not want to reorder them and inadvertently order the incorrect test.    Attempted to schedule patient a visit with rheumatology for labs today, but was told by Rheum receptionist that she can actually just walk in for labs. Provided patient with phone number and lab hours, so she can go and get her rheum labs. Patient appreciative.

## 2019-12-16 NOTE — Chronic Care Management (AMB) (Signed)
   Social Work  Care Management Collaboration 12/16/2019 Name: [name] MRN: [id number] DOB: [birth year]  [name] is a 74 y.o. year old female who sees [name], Delorse Limber, MD for primary care. LCSW was consulted to assistance patient with  Care coordination needs. Patient was not interviewed or contacted during this encounter.   Intervention: LCSW collaborated with [employer] and [employer]. Review of patient status, including review of consultants reports, relevant laboratory and other test results, and collaboration with appropriate care team members and the patient's provider was performed as part of comprehensive patient evaluation and provision of chronic care management services.   Goals Addressed            This Visit's Progress   . I need a wheelchair ramp   On track    [place] (see longitudinal plan of care for additional care plan information)  Current Barriers & progress:  . Patient needs community resources to assist with wheelchair ramp  . Acknowledges deficits and needs support, education and care coordination in order to meet this unmet need  Clinical Goal(s)  . Over the next 160 days, patient will work with LCSW to address concerns related to wheelchair ramp Interventions provided by LCSW:  . Assessed patient's care coordination needs  . Provided patient with information about agencies that assist with home repair and wheelchair ramps  . Provided patient with contact information for wheelchair ramps with [employer] and [employer] also received consent to submit referral for both via [facility] . [place] [facility] referral to [employer] accepted, LCSW called [phone] for update on referral. Left voice message Patient Self Care Activities & Deficits:  . Patient is unable to independently navigate community resource options without care coordination support  . Acknowledges deficits and is  motivated to resolve concern  . Patient will also call to follow up on referral Please see past updates related to this goal by clicking on the "Past Updates" button in the selected goal       . meals on wheels   On track    [place] (see longitudinal plan of care for additional care plan information)  Current Barriers & Progrss:  . Patient needs community resources for meals on wheels . Acknowledges deficits and needs support, education and care coordination in order to meet this unmet need  . Patient is unable to prepare meals and rely's on grandson to bring her fast food Clinical Goal(s)  . Over the next 120 days patient will have food needs met by meals on wheels Interventions provided by LCSW:  . Assessed patient's care coordination needs  . Provided patient with information about meals on wheels program . F/U call to [facility] to check wait list status, patient on wait list effective April 13th  Patient Self Care Activities & Deficits:  . Patient is unable to independently navigate community resource options without care coordination support  . Acknowledges deficits and is motivated to resolve concern  Please see past updates related to this goal by clicking on the "Past Updates" button in the selected goal      Plan:   1. LCSW will F/U with patient in 1 to 2 weeks 2. CCM RN has appointment with patient next week  [name], [place] / [place]   [phone] 1:24 PM

## 2019-12-16 NOTE — Assessment & Plan Note (Signed)
No current signs of infection Provided patient with handout on preventing pressure sores Advised to rotate how she is lying to prevent sores

## 2019-12-17 DIAGNOSIS — I509 Heart failure, unspecified: Secondary | ICD-10-CM | POA: Diagnosis not present

## 2019-12-17 DIAGNOSIS — M069 Rheumatoid arthritis, unspecified: Secondary | ICD-10-CM | POA: Diagnosis not present

## 2019-12-17 DIAGNOSIS — Z7951 Long term (current) use of inhaled steroids: Secondary | ICD-10-CM | POA: Diagnosis not present

## 2019-12-17 DIAGNOSIS — Z89611 Acquired absence of right leg above knee: Secondary | ICD-10-CM | POA: Diagnosis not present

## 2019-12-17 DIAGNOSIS — Z4781 Encounter for orthopedic aftercare following surgical amputation: Secondary | ICD-10-CM | POA: Diagnosis not present

## 2019-12-17 DIAGNOSIS — Z89612 Acquired absence of left leg above knee: Secondary | ICD-10-CM | POA: Diagnosis not present

## 2019-12-17 DIAGNOSIS — E440 Moderate protein-calorie malnutrition: Secondary | ICD-10-CM | POA: Diagnosis not present

## 2019-12-17 DIAGNOSIS — E039 Hypothyroidism, unspecified: Secondary | ICD-10-CM | POA: Diagnosis not present

## 2019-12-17 DIAGNOSIS — Z993 Dependence on wheelchair: Secondary | ICD-10-CM | POA: Diagnosis not present

## 2019-12-17 DIAGNOSIS — Z8631 Personal history of diabetic foot ulcer: Secondary | ICD-10-CM | POA: Diagnosis not present

## 2019-12-17 DIAGNOSIS — E1142 Type 2 diabetes mellitus with diabetic polyneuropathy: Secondary | ICD-10-CM | POA: Diagnosis not present

## 2019-12-17 DIAGNOSIS — Z4801 Encounter for change or removal of surgical wound dressing: Secondary | ICD-10-CM | POA: Diagnosis not present

## 2019-12-17 DIAGNOSIS — E1122 Type 2 diabetes mellitus with diabetic chronic kidney disease: Secondary | ICD-10-CM | POA: Diagnosis not present

## 2019-12-17 DIAGNOSIS — Z602 Problems related to living alone: Secondary | ICD-10-CM | POA: Diagnosis not present

## 2019-12-17 DIAGNOSIS — I130 Hypertensive heart and chronic kidney disease with heart failure and stage 1 through stage 4 chronic kidney disease, or unspecified chronic kidney disease: Secondary | ICD-10-CM | POA: Diagnosis not present

## 2019-12-17 DIAGNOSIS — N189 Chronic kidney disease, unspecified: Secondary | ICD-10-CM | POA: Diagnosis not present

## 2019-12-17 DIAGNOSIS — Z8739 Personal history of other diseases of the musculoskeletal system and connective tissue: Secondary | ICD-10-CM | POA: Diagnosis not present

## 2019-12-17 DIAGNOSIS — E1151 Type 2 diabetes mellitus with diabetic peripheral angiopathy without gangrene: Secondary | ICD-10-CM | POA: Diagnosis not present

## 2019-12-17 DIAGNOSIS — Z591 Inadequate housing: Secondary | ICD-10-CM | POA: Diagnosis not present

## 2019-12-17 DIAGNOSIS — G546 Phantom limb syndrome with pain: Secondary | ICD-10-CM | POA: Diagnosis not present

## 2019-12-17 DIAGNOSIS — M8580 Other specified disorders of bone density and structure, unspecified site: Secondary | ICD-10-CM | POA: Diagnosis not present

## 2019-12-21 ENCOUNTER — Telehealth: Payer: Self-pay | Admitting: Family Medicine

## 2019-12-21 DIAGNOSIS — I509 Heart failure, unspecified: Secondary | ICD-10-CM | POA: Diagnosis not present

## 2019-12-21 DIAGNOSIS — E039 Hypothyroidism, unspecified: Secondary | ICD-10-CM | POA: Diagnosis not present

## 2019-12-21 DIAGNOSIS — Z89611 Acquired absence of right leg above knee: Secondary | ICD-10-CM

## 2019-12-21 DIAGNOSIS — Z8631 Personal history of diabetic foot ulcer: Secondary | ICD-10-CM | POA: Diagnosis not present

## 2019-12-21 DIAGNOSIS — Z993 Dependence on wheelchair: Secondary | ICD-10-CM | POA: Diagnosis not present

## 2019-12-21 DIAGNOSIS — Z4801 Encounter for change or removal of surgical wound dressing: Secondary | ICD-10-CM | POA: Diagnosis not present

## 2019-12-21 DIAGNOSIS — M8580 Other specified disorders of bone density and structure, unspecified site: Secondary | ICD-10-CM | POA: Diagnosis not present

## 2019-12-21 DIAGNOSIS — E1122 Type 2 diabetes mellitus with diabetic chronic kidney disease: Secondary | ICD-10-CM | POA: Diagnosis not present

## 2019-12-21 DIAGNOSIS — M069 Rheumatoid arthritis, unspecified: Secondary | ICD-10-CM | POA: Diagnosis not present

## 2019-12-21 DIAGNOSIS — L89151 Pressure ulcer of sacral region, stage 1: Secondary | ICD-10-CM | POA: Diagnosis not present

## 2019-12-21 DIAGNOSIS — G546 Phantom limb syndrome with pain: Secondary | ICD-10-CM | POA: Diagnosis not present

## 2019-12-21 DIAGNOSIS — Z8739 Personal history of other diseases of the musculoskeletal system and connective tissue: Secondary | ICD-10-CM | POA: Diagnosis not present

## 2019-12-21 DIAGNOSIS — I130 Hypertensive heart and chronic kidney disease with heart failure and stage 1 through stage 4 chronic kidney disease, or unspecified chronic kidney disease: Secondary | ICD-10-CM | POA: Diagnosis not present

## 2019-12-21 DIAGNOSIS — Z7951 Long term (current) use of inhaled steroids: Secondary | ICD-10-CM | POA: Diagnosis not present

## 2019-12-21 DIAGNOSIS — Z602 Problems related to living alone: Secondary | ICD-10-CM | POA: Diagnosis not present

## 2019-12-21 DIAGNOSIS — Z4781 Encounter for orthopedic aftercare following surgical amputation: Secondary | ICD-10-CM | POA: Diagnosis not present

## 2019-12-21 DIAGNOSIS — Z591 Inadequate housing: Secondary | ICD-10-CM | POA: Diagnosis not present

## 2019-12-21 DIAGNOSIS — N189 Chronic kidney disease, unspecified: Secondary | ICD-10-CM | POA: Diagnosis not present

## 2019-12-21 DIAGNOSIS — E440 Moderate protein-calorie malnutrition: Secondary | ICD-10-CM | POA: Diagnosis not present

## 2019-12-21 DIAGNOSIS — Z89612 Acquired absence of left leg above knee: Secondary | ICD-10-CM | POA: Diagnosis not present

## 2019-12-21 DIAGNOSIS — E1151 Type 2 diabetes mellitus with diabetic peripheral angiopathy without gangrene: Secondary | ICD-10-CM | POA: Diagnosis not present

## 2019-12-21 DIAGNOSIS — E1142 Type 2 diabetes mellitus with diabetic polyneuropathy: Secondary | ICD-10-CM | POA: Diagnosis not present

## 2019-12-21 NOTE — Telephone Encounter (Signed)
Red team,  Please let patient know that I spoke with her vascular surgeon and he recommends she be evaluated at the [facility] to see if she is a candidate for prosthetic legs. I have placed this referral and she should get a call to schedule.  If she has questions I am happy to speak with her.  Thanks! [name], MD

## 2019-12-22 NOTE — Telephone Encounter (Signed)
Informed patient that she should expect a call from Outpatient Rehab to evaluate her for prosthetic legs.  Patient appreciative and verbalized understanding.   [name], [place]

## 2019-12-23 ENCOUNTER — Other Ambulatory Visit: Payer: Self-pay

## 2019-12-23 ENCOUNTER — Ambulatory Visit: Payer: Medicare Other

## 2019-12-23 DIAGNOSIS — Z591 Inadequate housing: Secondary | ICD-10-CM | POA: Diagnosis not present

## 2019-12-23 DIAGNOSIS — M8580 Other specified disorders of bone density and structure, unspecified site: Secondary | ICD-10-CM | POA: Diagnosis not present

## 2019-12-23 DIAGNOSIS — E1142 Type 2 diabetes mellitus with diabetic polyneuropathy: Secondary | ICD-10-CM | POA: Diagnosis not present

## 2019-12-23 DIAGNOSIS — I130 Hypertensive heart and chronic kidney disease with heart failure and stage 1 through stage 4 chronic kidney disease, or unspecified chronic kidney disease: Secondary | ICD-10-CM | POA: Diagnosis not present

## 2019-12-23 DIAGNOSIS — I509 Heart failure, unspecified: Secondary | ICD-10-CM | POA: Diagnosis not present

## 2019-12-23 DIAGNOSIS — Z993 Dependence on wheelchair: Secondary | ICD-10-CM | POA: Diagnosis not present

## 2019-12-23 DIAGNOSIS — E039 Hypothyroidism, unspecified: Secondary | ICD-10-CM | POA: Diagnosis not present

## 2019-12-23 DIAGNOSIS — E440 Moderate protein-calorie malnutrition: Secondary | ICD-10-CM | POA: Diagnosis not present

## 2019-12-23 DIAGNOSIS — Z602 Problems related to living alone: Secondary | ICD-10-CM | POA: Diagnosis not present

## 2019-12-23 DIAGNOSIS — E1151 Type 2 diabetes mellitus with diabetic peripheral angiopathy without gangrene: Secondary | ICD-10-CM | POA: Diagnosis not present

## 2019-12-23 DIAGNOSIS — Z7951 Long term (current) use of inhaled steroids: Secondary | ICD-10-CM | POA: Diagnosis not present

## 2019-12-23 DIAGNOSIS — L89151 Pressure ulcer of sacral region, stage 1: Secondary | ICD-10-CM | POA: Diagnosis not present

## 2019-12-23 DIAGNOSIS — Z89612 Acquired absence of left leg above knee: Secondary | ICD-10-CM | POA: Diagnosis not present

## 2019-12-23 DIAGNOSIS — Z4801 Encounter for change or removal of surgical wound dressing: Secondary | ICD-10-CM | POA: Diagnosis not present

## 2019-12-23 DIAGNOSIS — Z89611 Acquired absence of right leg above knee: Secondary | ICD-10-CM | POA: Diagnosis not present

## 2019-12-23 DIAGNOSIS — Z4781 Encounter for orthopedic aftercare following surgical amputation: Secondary | ICD-10-CM | POA: Diagnosis not present

## 2019-12-23 DIAGNOSIS — G546 Phantom limb syndrome with pain: Secondary | ICD-10-CM | POA: Diagnosis not present

## 2019-12-23 DIAGNOSIS — Z8739 Personal history of other diseases of the musculoskeletal system and connective tissue: Secondary | ICD-10-CM | POA: Diagnosis not present

## 2019-12-23 DIAGNOSIS — E1122 Type 2 diabetes mellitus with diabetic chronic kidney disease: Secondary | ICD-10-CM | POA: Diagnosis not present

## 2019-12-23 DIAGNOSIS — N189 Chronic kidney disease, unspecified: Secondary | ICD-10-CM | POA: Diagnosis not present

## 2019-12-23 DIAGNOSIS — Z8631 Personal history of diabetic foot ulcer: Secondary | ICD-10-CM | POA: Diagnosis not present

## 2019-12-23 DIAGNOSIS — M069 Rheumatoid arthritis, unspecified: Secondary | ICD-10-CM | POA: Diagnosis not present

## 2019-12-24 DIAGNOSIS — E1122 Type 2 diabetes mellitus with diabetic chronic kidney disease: Secondary | ICD-10-CM | POA: Diagnosis not present

## 2019-12-24 DIAGNOSIS — Z993 Dependence on wheelchair: Secondary | ICD-10-CM | POA: Diagnosis not present

## 2019-12-24 DIAGNOSIS — E440 Moderate protein-calorie malnutrition: Secondary | ICD-10-CM | POA: Diagnosis not present

## 2019-12-24 DIAGNOSIS — Z8631 Personal history of diabetic foot ulcer: Secondary | ICD-10-CM | POA: Diagnosis not present

## 2019-12-24 DIAGNOSIS — G546 Phantom limb syndrome with pain: Secondary | ICD-10-CM | POA: Diagnosis not present

## 2019-12-24 DIAGNOSIS — E1142 Type 2 diabetes mellitus with diabetic polyneuropathy: Secondary | ICD-10-CM | POA: Diagnosis not present

## 2019-12-24 DIAGNOSIS — E039 Hypothyroidism, unspecified: Secondary | ICD-10-CM | POA: Diagnosis not present

## 2019-12-24 DIAGNOSIS — Z7951 Long term (current) use of inhaled steroids: Secondary | ICD-10-CM | POA: Diagnosis not present

## 2019-12-24 DIAGNOSIS — Z4781 Encounter for orthopedic aftercare following surgical amputation: Secondary | ICD-10-CM | POA: Diagnosis not present

## 2019-12-24 DIAGNOSIS — M069 Rheumatoid arthritis, unspecified: Secondary | ICD-10-CM | POA: Diagnosis not present

## 2019-12-24 DIAGNOSIS — Z591 Inadequate housing: Secondary | ICD-10-CM | POA: Diagnosis not present

## 2019-12-24 DIAGNOSIS — Z602 Problems related to living alone: Secondary | ICD-10-CM | POA: Diagnosis not present

## 2019-12-24 DIAGNOSIS — M8580 Other specified disorders of bone density and structure, unspecified site: Secondary | ICD-10-CM | POA: Diagnosis not present

## 2019-12-24 DIAGNOSIS — L89151 Pressure ulcer of sacral region, stage 1: Secondary | ICD-10-CM | POA: Diagnosis not present

## 2019-12-24 DIAGNOSIS — Z89611 Acquired absence of right leg above knee: Secondary | ICD-10-CM | POA: Diagnosis not present

## 2019-12-24 DIAGNOSIS — Z89612 Acquired absence of left leg above knee: Secondary | ICD-10-CM | POA: Diagnosis not present

## 2019-12-24 DIAGNOSIS — I509 Heart failure, unspecified: Secondary | ICD-10-CM | POA: Diagnosis not present

## 2019-12-24 DIAGNOSIS — Z4801 Encounter for change or removal of surgical wound dressing: Secondary | ICD-10-CM | POA: Diagnosis not present

## 2019-12-24 DIAGNOSIS — N189 Chronic kidney disease, unspecified: Secondary | ICD-10-CM | POA: Diagnosis not present

## 2019-12-24 DIAGNOSIS — E1151 Type 2 diabetes mellitus with diabetic peripheral angiopathy without gangrene: Secondary | ICD-10-CM | POA: Diagnosis not present

## 2019-12-24 DIAGNOSIS — I130 Hypertensive heart and chronic kidney disease with heart failure and stage 1 through stage 4 chronic kidney disease, or unspecified chronic kidney disease: Secondary | ICD-10-CM | POA: Diagnosis not present

## 2019-12-24 DIAGNOSIS — Z8739 Personal history of other diseases of the musculoskeletal system and connective tissue: Secondary | ICD-10-CM | POA: Diagnosis not present

## 2019-12-24 NOTE — Chronic Care Management (AMB) (Signed)
Care Management   Follow Up Note   12/24/2019 Name: [name] MRN: [id number] DOB: 04/27/1946  Referred by: Kari Rio, MD Reason for referral : Care Coordination (Care Management RNCM f/u Home Health)   [name] is a 74 y.o. year old female who is a primary care patient of Kari Mems Delorse Limber, MD. The care management team was consulted for assistance with care management and care coordination needs.    Review of patient status, including review of consultants reports, relevant laboratory and other test results, and collaboration with appropriate care team members and the patient's provider was performed as part of comprehensive patient evaluation and provision of chronic care management services.    SDOH (Social Determinants of Health) assessments performed: No See Care Plan activities for detailed interventions related to [facility])     Advanced Directives: See Care Plan and [employer] application for related entries.   Goals Addressed            This Visit's Progress   . " I am confused with having my legs amputatated and trying to cope" (pt-stated)       CARE PLAN ENTRY (see longtitudinal plan of care for additional care plan information)  Current Barriers:  . Chronic Disease Management support and education needs/crisis intervention in patient with bilateral below the knee amputation, Iron deficiency anemia, and community resources/urgent housing crisis  . Knowledge Deficits related to imbalanced nutrition less than body requirements as evidenced by Moderate protein-calorie malnutrition  Nurse Case Manager Clinical Goal(s):  Marland Kitchen Over the next 30 days, patient will verbalize understanding of plan  Interventions:  . Evaluation of current treatment plan related patient's adherence to plan as established by provider. . Provided education to patient re: nutrition and wound healing . Collaborated with SW regarding transportation . Discussed plans with patient for  ongoing care management follow up and provided patient with direct contact information for care management team . Patient stated that [facility] came to her home and she did not have hot water or heat.  They called APS and she had 30 days to fix the problem.The patient states that she has lived in the home and has not had hot water or heat for over 2 years and done well.  She has spoken to people to fix the problem and it will cost 6k.  She has her grandsons that are staying with her.  They provide food for her.  They are mainly eating out.  She states that she does have canned food in the home but nothing in the frig.  She states that she is confused with all of the people she has spoken with and can't remember names.  Social Worker [name] was on the phone by speaker in the room talking with the patient . Patient was given depends for incontinence,  a box of food and a scat applications for transportation. I will f/u with the patient on 11/15/19 . 11/15/19 . Spoke with Mrs [name] and she states that APS came to the home on 11/11/19.  She said that she thinks the workers name was [name].  The patient states that she was asked how long it would take for her to leave the home.  The patient was talking very low and it was hard to understand what she was saying. . She stated that her son bought her a water heater and someone was there putting it in.  I asked to speak with him.  His name was [name]  and he was putting in a new water heater.  He stated he worked with [name]. . Patient states that her sons are still buying her meals . Marland Kitchen We again discussed importance of eating and wound healing.  I am sending her information on nutrition. . The patient states that she needs to look into getting new dentures.  She states that hers have a crack in them and they were helping her when she was in the SNF but she doesn't know what happened since she left . 11/26/19 . Spoke with [name] and she states that she  is doing  well.  She states that APS worker has released her.  She has not heard anything from [name]. . I followed up with Kari Lanius LCSW and APS did release the patient.  I called [name] to see if they were notified by APS.  The receptionist was unsure of the information and left a message for the case manager to give me a call back. . The patient was also concerned about her moderna vaccine that she received while at the [facility] center.  I called and left a message for [name] at [phone] ext [phone].  She called me back and left a message that the patient had her shot on 10/27/19 given by [facility] and she could fax the information to me.  I called back and asked her to fax the information to [phone]. . The patient was called and notified of the information  . She states that she has all of her medications and taking them . Her grandson [name] is in the home and they have food and eat out . 12/23/19 . Patient states that home health has started in the home nurse is coming today . Physical therapy came on Monday and will be coming 2 day/week . Patient states that Sw is scheduled to come to the home on Monday . Patient states that someone called in regards to a ramp and left a message.  Advised the patient to make sure that she returns the call.   . Patient Self Care Activities:  . Patient verbalizes understanding of plan  . Attends all scheduled provider appointments . Calls provider office for new concerns or questions . Unable to independently self manage community resources  Please see past updates related to this goal by clicking on the "Past Updates" button in the selected goal          The care management team will reach out to the patient again over the next 14 days.  The patient has been provided with contact information for the care management team and has been advised to call with any health related questions or concerns.   Kari Arms RN,  BSN, [facility] Care Management Coordinator [place] Phone: [phone] Fax: [phone]

## 2019-12-24 NOTE — Patient Instructions (Signed)
Visit Information  Goals Addressed            This Visit's Progress   . " I am confused with having my legs amputatated and trying to cope" (pt-stated)       CARE PLAN ENTRY (see longtitudinal plan of care for additional care plan information)  Current Barriers:  . Chronic Disease Management support and education needs/crisis intervention in patient with bilateral below the knee amputation, Iron deficiency anemia, and community resources/urgent housing crisis  . Knowledge Deficits related to imbalanced nutrition less than body requirements as evidenced by Moderate protein-calorie malnutrition  Nurse Case Manager Clinical Goal(s):  Marland Kitchen Over the next 30 days, patient will verbalize understanding of plan  Interventions:  . Evaluation of current treatment plan related patient's adherence to plan as established by provider. . Provided education to patient re: nutrition and wound healing . Collaborated with SW regarding transportation . Discussed plans with patient for ongoing care management follow up and provided patient with direct contact information for care management team . Patient stated that [facility] came to her home and she did not have hot water or heat.  They called APS and she had 30 days to fix the problem.The patient states that she has lived in the home and has not had hot water or heat for over 2 years and done well.  She has spoken to people to fix the problem and it will cost 6k.  She has her grandsons that are staying with her.  They provide food for her.  They are mainly eating out.  She states that she does have canned food in the home but nothing in the frig.  She states that she is confused with all of the people she has spoken with and can't remember names.  Social Worker [name] was on the phone by speaker in the room talking with the patient . Patient was given depends for incontinence,  a box of food and a scat applications for transportation. I will f/u with the patient  on 11/15/19 . 11/15/19 . Spoke with Mrs Busser and she states that APS came to the home on 11/11/19.  She said that she thinks the workers name was Camile.  The patient states that she was asked how long it would take for her to leave the home.  The patient was talking very low and it was hard to understand what she was saying. . She stated that her son bought her a water heater and someone was there putting it in.  I asked to speak with him.  His name was Cecilie Lowers and he was putting in a new water heater.  He stated he worked with [employer] home and office. . Patient states that her sons are still buying her meals . Marland Kitchen We again discussed importance of eating and wound healing.  I am sending her information on nutrition. . The patient states that she needs to look into getting new dentures.  She states that hers have a crack in them and they were helping her when she was in the SNF but she doesn't know what happened since she left . 11/26/19 . Spoke with Mrs. Batt and she states that she is doing  well.  She states that APS worker has released her.  She has not heard anything from [place]. . I followed up with [name] LCSW and APS did release the patient.  I called [facility] to see if they were notified by APS.  The receptionist was unsure of the information and left a message for the case manager to give me a call back. . The patient was also concerned about her moderna vaccine that she received while at the [facility] center.  I called and left a message for Mrs. Fonnie Mu at [phone] ext [phone].  She called me back and left a message that the patient had her shot [facility] and she could fax the information to me.  I called back and asked her to fax the information to [phone]. . The patient was called and notified of the information  . She states that she has all of her medications and taking them . Her grandson Rosalio Macadamia is in the home and they have food and eat  out . 12/23/19 . Patient states that home health has started in the home nurse is coming today . Physical therapy came on Monday and will be coming 2 day/week . Patient states that Sw is scheduled to come to the home on Monday . Patient states that someone called in regards to a ramp and left a message.  Advised the patient to make sure that she returns the call.   . Patient Self Care Activities:  . Patient verbalizes understanding of plan  . Attends all scheduled provider appointments . Calls provider office for new concerns or questions . Unable to independently self manage community resources  Please see past updates related to this goal by clicking on the "Past Updates" button in the selected goal         Ms. Klingerman was given information about Care Management services today including:  1. Care Management services include personalized support from designated clinical staff supervised by her physician, including individualized plan of care and coordination with other care providers 2. 24/7 contact phone numbers for assistance for urgent and routine care needs. 3. The patient may stop CCM services at any time (effective at the end of the month) by phone call to the office staff.  Patient agreed to services and verbal consent obtained.   The patient verbalized understanding of instructions provided today and declined a print copy of patient instruction materials.   The care management team will reach out to the patient again over the next 14 days.  The patient has been provided with contact information for the care management team and has been advised to call with any health related questions or concerns.   [name] RN, BSN, [facility] Care Management Coordinator [place] Phone: [phone] Fax: [phone]

## 2019-12-27 DIAGNOSIS — Z993 Dependence on wheelchair: Secondary | ICD-10-CM | POA: Diagnosis not present

## 2019-12-27 DIAGNOSIS — I509 Heart failure, unspecified: Secondary | ICD-10-CM | POA: Diagnosis not present

## 2019-12-27 DIAGNOSIS — E440 Moderate protein-calorie malnutrition: Secondary | ICD-10-CM | POA: Diagnosis not present

## 2019-12-27 DIAGNOSIS — Z602 Problems related to living alone: Secondary | ICD-10-CM | POA: Diagnosis not present

## 2019-12-27 DIAGNOSIS — E1142 Type 2 diabetes mellitus with diabetic polyneuropathy: Secondary | ICD-10-CM | POA: Diagnosis not present

## 2019-12-27 DIAGNOSIS — I130 Hypertensive heart and chronic kidney disease with heart failure and stage 1 through stage 4 chronic kidney disease, or unspecified chronic kidney disease: Secondary | ICD-10-CM | POA: Diagnosis not present

## 2019-12-27 DIAGNOSIS — Z4801 Encounter for change or removal of surgical wound dressing: Secondary | ICD-10-CM | POA: Diagnosis not present

## 2019-12-27 DIAGNOSIS — N189 Chronic kidney disease, unspecified: Secondary | ICD-10-CM | POA: Diagnosis not present

## 2019-12-27 DIAGNOSIS — Z89611 Acquired absence of right leg above knee: Secondary | ICD-10-CM | POA: Diagnosis not present

## 2019-12-27 DIAGNOSIS — Z89612 Acquired absence of left leg above knee: Secondary | ICD-10-CM | POA: Diagnosis not present

## 2019-12-27 DIAGNOSIS — Z8631 Personal history of diabetic foot ulcer: Secondary | ICD-10-CM | POA: Diagnosis not present

## 2019-12-27 DIAGNOSIS — Z4781 Encounter for orthopedic aftercare following surgical amputation: Secondary | ICD-10-CM | POA: Diagnosis not present

## 2019-12-27 DIAGNOSIS — Z7951 Long term (current) use of inhaled steroids: Secondary | ICD-10-CM | POA: Diagnosis not present

## 2019-12-27 DIAGNOSIS — L89151 Pressure ulcer of sacral region, stage 1: Secondary | ICD-10-CM | POA: Diagnosis not present

## 2019-12-27 DIAGNOSIS — M069 Rheumatoid arthritis, unspecified: Secondary | ICD-10-CM | POA: Diagnosis not present

## 2019-12-27 DIAGNOSIS — E1151 Type 2 diabetes mellitus with diabetic peripheral angiopathy without gangrene: Secondary | ICD-10-CM | POA: Diagnosis not present

## 2019-12-27 DIAGNOSIS — Z591 Inadequate housing: Secondary | ICD-10-CM | POA: Diagnosis not present

## 2019-12-27 DIAGNOSIS — Z8739 Personal history of other diseases of the musculoskeletal system and connective tissue: Secondary | ICD-10-CM | POA: Diagnosis not present

## 2019-12-27 DIAGNOSIS — G546 Phantom limb syndrome with pain: Secondary | ICD-10-CM | POA: Diagnosis not present

## 2019-12-27 DIAGNOSIS — E039 Hypothyroidism, unspecified: Secondary | ICD-10-CM | POA: Diagnosis not present

## 2019-12-27 DIAGNOSIS — E1122 Type 2 diabetes mellitus with diabetic chronic kidney disease: Secondary | ICD-10-CM | POA: Diagnosis not present

## 2019-12-27 DIAGNOSIS — M8580 Other specified disorders of bone density and structure, unspecified site: Secondary | ICD-10-CM | POA: Diagnosis not present

## 2019-12-28 ENCOUNTER — Ambulatory Visit: Payer: Medicare Other

## 2019-12-28 DIAGNOSIS — L89151 Pressure ulcer of sacral region, stage 1: Secondary | ICD-10-CM | POA: Diagnosis not present

## 2019-12-28 DIAGNOSIS — Z993 Dependence on wheelchair: Secondary | ICD-10-CM | POA: Diagnosis not present

## 2019-12-28 DIAGNOSIS — M069 Rheumatoid arthritis, unspecified: Secondary | ICD-10-CM | POA: Diagnosis not present

## 2019-12-28 DIAGNOSIS — Z4801 Encounter for change or removal of surgical wound dressing: Secondary | ICD-10-CM | POA: Diagnosis not present

## 2019-12-28 DIAGNOSIS — Z8631 Personal history of diabetic foot ulcer: Secondary | ICD-10-CM | POA: Diagnosis not present

## 2019-12-28 DIAGNOSIS — Z7951 Long term (current) use of inhaled steroids: Secondary | ICD-10-CM | POA: Diagnosis not present

## 2019-12-28 DIAGNOSIS — G546 Phantom limb syndrome with pain: Secondary | ICD-10-CM | POA: Diagnosis not present

## 2019-12-28 DIAGNOSIS — Z591 Inadequate housing: Secondary | ICD-10-CM | POA: Diagnosis not present

## 2019-12-28 DIAGNOSIS — Z89612 Acquired absence of left leg above knee: Secondary | ICD-10-CM | POA: Diagnosis not present

## 2019-12-28 DIAGNOSIS — Z602 Problems related to living alone: Secondary | ICD-10-CM | POA: Diagnosis not present

## 2019-12-28 DIAGNOSIS — E1151 Type 2 diabetes mellitus with diabetic peripheral angiopathy without gangrene: Secondary | ICD-10-CM | POA: Diagnosis not present

## 2019-12-28 DIAGNOSIS — I509 Heart failure, unspecified: Secondary | ICD-10-CM | POA: Diagnosis not present

## 2019-12-28 DIAGNOSIS — E039 Hypothyroidism, unspecified: Secondary | ICD-10-CM | POA: Diagnosis not present

## 2019-12-28 DIAGNOSIS — E1122 Type 2 diabetes mellitus with diabetic chronic kidney disease: Secondary | ICD-10-CM | POA: Diagnosis not present

## 2019-12-28 DIAGNOSIS — Z139 Encounter for screening, unspecified: Secondary | ICD-10-CM

## 2019-12-28 DIAGNOSIS — Z89611 Acquired absence of right leg above knee: Secondary | ICD-10-CM | POA: Diagnosis not present

## 2019-12-28 DIAGNOSIS — I130 Hypertensive heart and chronic kidney disease with heart failure and stage 1 through stage 4 chronic kidney disease, or unspecified chronic kidney disease: Secondary | ICD-10-CM | POA: Diagnosis not present

## 2019-12-28 DIAGNOSIS — Z8739 Personal history of other diseases of the musculoskeletal system and connective tissue: Secondary | ICD-10-CM | POA: Diagnosis not present

## 2019-12-28 DIAGNOSIS — E440 Moderate protein-calorie malnutrition: Secondary | ICD-10-CM | POA: Diagnosis not present

## 2019-12-28 DIAGNOSIS — Z7189 Other specified counseling: Secondary | ICD-10-CM

## 2019-12-28 DIAGNOSIS — M8580 Other specified disorders of bone density and structure, unspecified site: Secondary | ICD-10-CM | POA: Diagnosis not present

## 2019-12-28 DIAGNOSIS — N189 Chronic kidney disease, unspecified: Secondary | ICD-10-CM | POA: Diagnosis not present

## 2019-12-28 DIAGNOSIS — Z4781 Encounter for orthopedic aftercare following surgical amputation: Secondary | ICD-10-CM | POA: Diagnosis not present

## 2019-12-28 DIAGNOSIS — E1142 Type 2 diabetes mellitus with diabetic polyneuropathy: Secondary | ICD-10-CM | POA: Diagnosis not present

## 2019-12-28 NOTE — Patient Instructions (Signed)
Licensed Clinical Social Worker Visit Information [name]  it was nice speaking with you. Please call me directly if you have questions [phone] Goals we discussed today:  Goals Addressed            This Visit's Progress   . I need a wheelchair ramp   Not on track    [place] (see longitudinal plan of care for additional care plan information)  Current Barriers & progress:  . Patient needs community resources to assist with wheelchair ramp  . Acknowledges deficits and needs support, education and care coordination in order to meet this unmet need  . Patient received voice message about ramp from agency, has called several times but unable to speak to anyone Clinical Goal(s)  . Over the next 160 days, patient will work with LCSW to address concerns related to wheelchair ramp Interventions provided by LCSW:  . Assessed patient's care coordination needs with barriers to connecting with agency to assist with ramp . Provided patient with information about agencies that assist with home repair and wheelchair ramps  . Provided patient with contact information for wheelchair ramps with [employer] and [employer] also received consent to submit referral for both via NC360 . [place] 360 referral to [employer] accepted, LCSW called [phone] for update on referral. Left voice message.  No return phone call . Collaborated with [facility] ageing Optician, dispensing for update on referral. Patient Self Care Activities & Deficits:  . Patient is unable to independently navigate community resource options without care coordination support  . Acknowledges deficits and is motivated to resolve concern  . Patient will continue to follow up on referral Please see past updates related to this goal by clicking on the "Past Updates" button in the selected goal      . meals on wheels   On track    [place] (see longitudinal plan of care  for additional care plan information)  Current Barriers & Progress:  . Patient needs community resources for meals on wheels . Acknowledges deficits and needs support, education and care coordination in order to meet this unmet need  . Patient is unable to prepare meals and rely's on grandson to bring her fast food . Patient is on wait list for mobile meals Clinical Goal(s)  . Over the next 120 days patient will have food needs met by meals on wheels Interventions provided by LCSW:  . Assessed patient's care coordination needs related to healthy food . patient on wait list effective April 13th for mobile meals . Provided patient with information about mom's meal program to bridge gap while on wait list for mobile meals . Application faxed to [facility] to assist with starting meals Patient Self Care Activities & Deficits:  . Patient is unable to independently navigate community resource options without care coordination support  . Acknowledges deficits and is motivated to resolve concern  Please see past updates related to this goal by clicking on the "Past Updates" button in the selected goal     . COMPLETED: transportation       [place] (see longitudinal plan of care for additional care plan information) Current Barriers & Progress:  . Patient with HTN and memory concerns  needs community resources for transportation,  . Acknowledges deficits and needs support, education and care coordination in order to meet this unmet need  . Lacks knowledge of community resource:   . Patient has established transportation with  [employer] for all medical appointments . SCAT transportation approved  Clinical Goal(s)  . Over the next 30 days, patient will have all transportation needs met for medical and other appointment by work with LCSW to complete SCAT applications Interventions provided by LCSW:  . Assessed patient's care coordination needs and barriers with SCAT . Contacted SCAT office for update  on SCAT application . Provided patient with phone number to schedule SCAT rides [phone] . Collaborated with CCM RN care team members regarding patient needs Patient Self Care Activities & Deficits:  . Patient is unable to independently navigate community resource options without care coordination support  . Acknowledges deficits and is motivated to resolve concern  . Patient will call SCAT to schedule rides when needed Please see past updates related to this goal by clicking on the "Past Updates" button in the selected goal       Materials provided: Verbal education about meal program provided by phone [name] received Care Management services today:  1. Care Management services include personalized support from designated clinical staff supervised by her physician, including individualized plan of care and coordination with other care providers 2. 24/7 contact [phone] for assistance for urgent and routine care needs. 3. Care Management are voluntary services and be declined at any time by calling the office.  Patient verbally agreed to assistance and services provided by embedded care coordination/care management team today.   Patient verbalizes understanding of instructions provided today.  Follow up plan:  SW will follow up with patient by phone over the next 4 weeks  Maurine Cane, LCSW

## 2019-12-28 NOTE — Chronic Care Management (AMB) (Signed)
Care Management   Clinical Social Work Follow Up   12/28/2019 Name: [name] MRN: [id number] DOB: 10-12-1945  Referred by: [name], MD  Reason for referral : Care Coordination (community support)  [name] is a 74 y.o. year old female who is a primary care patient of [name], MD.  Reason for follow-up: Phone encounter with patient today for ongoing assessment and brief interventions to assist with care coordination needs for transportation, meals and ramp.   Assessment: Patient continues to experience need additional community support as she adjust to her new normal.  She has a home health social worker assisting her with concerns with Medicaid and food stamps as well as PT and OT with with Home Health. See care plan below for interventions from LCSW.  Review of patient status, including review of consultants reports, relevant laboratory and other test results, and collaboration with appropriate care team members and the patient's provider was performed as part of comprehensive patient evaluation and provision of care management services.    Advance Directive Status: N See Care Plan and Vynca application for related entries. SDOH (Social Determinants of Health) assessments performed: Yes   Goals Addressed            This Visit's Progress   . I need a wheelchair ramp   Not on track    [place] (see longitudinal plan of care for additional care plan information)  Current Barriers & progress:  . Patient needs community resources to assist with wheelchair ramp  . Acknowledges deficits and needs support, education and care coordination in order to meet this unmet need  . Patient received voice message about ramp from agency, has called several times but unable to speak to anyone Clinical Goal(s)  . Over the next 160 days, patient will work with LCSW to address concerns related to wheelchair ramp Interventions provided by LCSW:  . Assessed  patient's care coordination needs with barriers to connecting with agency to assist with ramp . Provided patient with information about agencies that assist with home repair and wheelchair ramps  . Provided patient with contact information for wheelchair ramps with [employer] and [employer] also received consent to submit referral for both via NC360 . [place] 360 referral to [employer] accepted, LCSW called [phone] for update on referral. Left voice message.  No return phone call . Collaborated with [facility] ageing Optician, dispensing for update on referral. Patient Self Care Activities & Deficits:  . Patient is unable to independently navigate community resource options without care coordination support  . Acknowledges deficits and is motivated to resolve concern  . Patient will continue to follow up on referral Please see past updates related to this goal by clicking on the "Past Updates" button in the selected goal      . meals on wheels   On track    [place] (see longitudinal plan of care for additional care plan information)  Current Barriers & Progress:  . Patient needs community resources for meals on wheels . Acknowledges deficits and needs support, education and care coordination in order to meet this unmet need  . Patient is unable to prepare meals and rely's on grandson to bring her fast food . Patient is on wait list for mobile meals Clinical Goal(s)  . Over the next 120 days patient will have food needs met by meals on wheels Interventions provided by LCSW:  . Assessed patient's  care coordination needs related to healthy food . patient on wait list effective April 13th for mobile meals . Provided patient with information about mom's meal program to bridge gap while on wait list for mobile meals . Application faxed to [facility] to assist with starting meals Patient Self Care Activities & Deficits:  . Patient is  unable to independently navigate community resource options without care coordination support  . Acknowledges deficits and is motivated to resolve concern  Please see past updates related to this goal by clicking on the "Past Updates" button in the selected goal     . COMPLETED: transportation       [place] (see longitudinal plan of care for additional care plan information) Current Barriers & Progress:  . Patient with HTN and memory concerns  needs community resources for transportation,  . Acknowledges deficits and needs support, education and care coordination in order to meet this unmet need  . Lacks knowledge of community resource:   . Patient has established transportation with [facility] for all medical appointments . SCAT transportation approved  Clinical Goal(s)  . Over the next 30 days, patient will have all transportation needs met for medical and other appointment by work with LCSW to complete SCAT applications Interventions provided by LCSW:  . Assessed patient's care coordination needs and barriers with SCAT . Contacted SCAT office for update on SCAT application . Provided patient with phone number to schedule SCAT rides [phone] . Collaborated with CCM RN care team members regarding patient needs Patient Self Care Activities & Deficits:  . Patient is unable to independently navigate community resource options without care coordination support  . Acknowledges deficits and is motivated to resolve concern  . Patient will call SCAT to schedule rides when needed Please see past updates related to this goal by clicking on the "Past Updates" button in the selected goal        Outpatient Encounter Medications as of 12/28/2019  Medication Sig  . acetaminophen (TYLENOL) 325 MG tablet Take 2 tablets (650 mg total) by mouth every 6 (six) hours.  . Blood Glucose Monitoring Suppl (ONETOUCH VERIO) w/Device KIT Check sugar 3 times per day  . feeding supplement, ENSURE ENLIVE, (ENSURE  ENLIVE) LIQD Take 237 mLs by mouth 3 (three) times daily between meals.  . ferrous sulfate 324 MG TBEC Take 1 tablet (324 mg total) by mouth daily with breakfast.  . furosemide (LASIX) 40 MG tablet Take 1 tablet (40 mg total) by mouth daily.  Marland Kitchen gabapentin (NEURONTIN) 100 MG capsule Take 1 capsule (100 mg total) by mouth every 8 (eight) hours.  Marland Kitchen glucose blood (ONETOUCH VERIO) test strip Check blood sugar daily  . Lancet Devices (MICROLET NEXT LANCING DEVICE) MISC 1 each by Does not apply route 3 (three) times daily. Check blood sugar 3 times daily  . levothyroxine (SYNTHROID) 150 MCG tablet Take 1 tablet (150 mcg total) by mouth daily.  Glory Rosebush DELICA LANCETS 81L MISC Check blood sugar once daily  . PROAIR HFA 108 (90 Base) MCG/ACT inhaler INHALE 2 PUFFS INTO THE LUNGS EVERY 6 HOURS AS NEEDED FOR WHEEZING OR SHORTNESS OF BREATH  . rosuvastatin (CRESTOR) 20 MG tablet Take 1 tablet (20 mg total) by mouth daily.   No facility-administered encounter medications on file as of 12/28/2019.   Plan: LCSW will F/U with patient in 3 to 4 weeks 1. Meals will be delivers in 3 to 5 days 2. SCAT application approved patient can start using service 3. Aging  gracefully agency will F/U with patient this week   [name], [place] / [place]   [phone] 1:23 PM [name], [place] / [place]   [phone] 12:24 PM

## 2019-12-30 DIAGNOSIS — E1151 Type 2 diabetes mellitus with diabetic peripheral angiopathy without gangrene: Secondary | ICD-10-CM | POA: Diagnosis not present

## 2019-12-30 DIAGNOSIS — Z602 Problems related to living alone: Secondary | ICD-10-CM | POA: Diagnosis not present

## 2019-12-30 DIAGNOSIS — E1142 Type 2 diabetes mellitus with diabetic polyneuropathy: Secondary | ICD-10-CM | POA: Diagnosis not present

## 2019-12-30 DIAGNOSIS — G546 Phantom limb syndrome with pain: Secondary | ICD-10-CM | POA: Diagnosis not present

## 2019-12-30 DIAGNOSIS — I509 Heart failure, unspecified: Secondary | ICD-10-CM | POA: Diagnosis not present

## 2019-12-30 DIAGNOSIS — Z4781 Encounter for orthopedic aftercare following surgical amputation: Secondary | ICD-10-CM | POA: Diagnosis not present

## 2019-12-30 DIAGNOSIS — Z591 Inadequate housing: Secondary | ICD-10-CM | POA: Diagnosis not present

## 2019-12-30 DIAGNOSIS — Z8739 Personal history of other diseases of the musculoskeletal system and connective tissue: Secondary | ICD-10-CM | POA: Diagnosis not present

## 2019-12-30 DIAGNOSIS — Z89612 Acquired absence of left leg above knee: Secondary | ICD-10-CM | POA: Diagnosis not present

## 2019-12-30 DIAGNOSIS — E440 Moderate protein-calorie malnutrition: Secondary | ICD-10-CM | POA: Diagnosis not present

## 2019-12-30 DIAGNOSIS — M8580 Other specified disorders of bone density and structure, unspecified site: Secondary | ICD-10-CM | POA: Diagnosis not present

## 2019-12-30 DIAGNOSIS — Z89611 Acquired absence of right leg above knee: Secondary | ICD-10-CM | POA: Diagnosis not present

## 2019-12-30 DIAGNOSIS — L89151 Pressure ulcer of sacral region, stage 1: Secondary | ICD-10-CM | POA: Diagnosis not present

## 2019-12-30 DIAGNOSIS — E039 Hypothyroidism, unspecified: Secondary | ICD-10-CM | POA: Diagnosis not present

## 2019-12-30 DIAGNOSIS — N189 Chronic kidney disease, unspecified: Secondary | ICD-10-CM | POA: Diagnosis not present

## 2019-12-30 DIAGNOSIS — Z8631 Personal history of diabetic foot ulcer: Secondary | ICD-10-CM | POA: Diagnosis not present

## 2019-12-30 DIAGNOSIS — E1122 Type 2 diabetes mellitus with diabetic chronic kidney disease: Secondary | ICD-10-CM | POA: Diagnosis not present

## 2019-12-30 DIAGNOSIS — Z4801 Encounter for change or removal of surgical wound dressing: Secondary | ICD-10-CM | POA: Diagnosis not present

## 2019-12-30 DIAGNOSIS — M069 Rheumatoid arthritis, unspecified: Secondary | ICD-10-CM | POA: Diagnosis not present

## 2019-12-30 DIAGNOSIS — Z993 Dependence on wheelchair: Secondary | ICD-10-CM | POA: Diagnosis not present

## 2019-12-30 DIAGNOSIS — Z7951 Long term (current) use of inhaled steroids: Secondary | ICD-10-CM | POA: Diagnosis not present

## 2019-12-30 DIAGNOSIS — I130 Hypertensive heart and chronic kidney disease with heart failure and stage 1 through stage 4 chronic kidney disease, or unspecified chronic kidney disease: Secondary | ICD-10-CM | POA: Diagnosis not present

## 2020-01-01 DIAGNOSIS — S78111D Complete traumatic amputation at level between right hip and knee, subsequent encounter: Secondary | ICD-10-CM | POA: Diagnosis not present

## 2020-01-01 DIAGNOSIS — S78112D Complete traumatic amputation at level between left hip and knee, subsequent encounter: Secondary | ICD-10-CM | POA: Diagnosis not present

## 2020-01-01 DIAGNOSIS — M6281 Muscle weakness (generalized): Secondary | ICD-10-CM | POA: Diagnosis not present

## 2020-01-01 DIAGNOSIS — I5032 Chronic diastolic (congestive) heart failure: Secondary | ICD-10-CM | POA: Diagnosis not present

## 2020-01-03 ENCOUNTER — Other Ambulatory Visit: Payer: Self-pay

## 2020-01-03 DIAGNOSIS — Z4801 Encounter for change or removal of surgical wound dressing: Secondary | ICD-10-CM | POA: Diagnosis not present

## 2020-01-03 DIAGNOSIS — E1142 Type 2 diabetes mellitus with diabetic polyneuropathy: Secondary | ICD-10-CM | POA: Diagnosis not present

## 2020-01-03 DIAGNOSIS — Z4781 Encounter for orthopedic aftercare following surgical amputation: Secondary | ICD-10-CM | POA: Diagnosis not present

## 2020-01-03 DIAGNOSIS — E039 Hypothyroidism, unspecified: Secondary | ICD-10-CM | POA: Diagnosis not present

## 2020-01-03 DIAGNOSIS — Z8739 Personal history of other diseases of the musculoskeletal system and connective tissue: Secondary | ICD-10-CM | POA: Diagnosis not present

## 2020-01-03 DIAGNOSIS — Z602 Problems related to living alone: Secondary | ICD-10-CM | POA: Diagnosis not present

## 2020-01-03 DIAGNOSIS — I130 Hypertensive heart and chronic kidney disease with heart failure and stage 1 through stage 4 chronic kidney disease, or unspecified chronic kidney disease: Secondary | ICD-10-CM | POA: Diagnosis not present

## 2020-01-03 DIAGNOSIS — Z89611 Acquired absence of right leg above knee: Secondary | ICD-10-CM | POA: Diagnosis not present

## 2020-01-03 DIAGNOSIS — E1151 Type 2 diabetes mellitus with diabetic peripheral angiopathy without gangrene: Secondary | ICD-10-CM | POA: Diagnosis not present

## 2020-01-03 DIAGNOSIS — E1122 Type 2 diabetes mellitus with diabetic chronic kidney disease: Secondary | ICD-10-CM | POA: Diagnosis not present

## 2020-01-03 DIAGNOSIS — I509 Heart failure, unspecified: Secondary | ICD-10-CM | POA: Diagnosis not present

## 2020-01-03 DIAGNOSIS — M069 Rheumatoid arthritis, unspecified: Secondary | ICD-10-CM | POA: Diagnosis not present

## 2020-01-03 DIAGNOSIS — D508 Other iron deficiency anemias: Secondary | ICD-10-CM

## 2020-01-03 DIAGNOSIS — Z993 Dependence on wheelchair: Secondary | ICD-10-CM | POA: Diagnosis not present

## 2020-01-03 DIAGNOSIS — D649 Anemia, unspecified: Secondary | ICD-10-CM

## 2020-01-03 DIAGNOSIS — Z7951 Long term (current) use of inhaled steroids: Secondary | ICD-10-CM | POA: Diagnosis not present

## 2020-01-03 DIAGNOSIS — L89151 Pressure ulcer of sacral region, stage 1: Secondary | ICD-10-CM | POA: Diagnosis not present

## 2020-01-03 DIAGNOSIS — Z8631 Personal history of diabetic foot ulcer: Secondary | ICD-10-CM | POA: Diagnosis not present

## 2020-01-03 DIAGNOSIS — Z89612 Acquired absence of left leg above knee: Secondary | ICD-10-CM | POA: Diagnosis not present

## 2020-01-03 DIAGNOSIS — E440 Moderate protein-calorie malnutrition: Secondary | ICD-10-CM | POA: Diagnosis not present

## 2020-01-03 DIAGNOSIS — M8580 Other specified disorders of bone density and structure, unspecified site: Secondary | ICD-10-CM | POA: Diagnosis not present

## 2020-01-03 DIAGNOSIS — Z591 Inadequate housing: Secondary | ICD-10-CM | POA: Diagnosis not present

## 2020-01-03 DIAGNOSIS — G546 Phantom limb syndrome with pain: Secondary | ICD-10-CM | POA: Diagnosis not present

## 2020-01-03 DIAGNOSIS — N189 Chronic kidney disease, unspecified: Secondary | ICD-10-CM | POA: Diagnosis not present

## 2020-01-04 DIAGNOSIS — L89151 Pressure ulcer of sacral region, stage 1: Secondary | ICD-10-CM | POA: Diagnosis not present

## 2020-01-04 DIAGNOSIS — Z89612 Acquired absence of left leg above knee: Secondary | ICD-10-CM | POA: Diagnosis not present

## 2020-01-04 DIAGNOSIS — Z993 Dependence on wheelchair: Secondary | ICD-10-CM | POA: Diagnosis not present

## 2020-01-04 DIAGNOSIS — N189 Chronic kidney disease, unspecified: Secondary | ICD-10-CM | POA: Diagnosis not present

## 2020-01-04 DIAGNOSIS — E039 Hypothyroidism, unspecified: Secondary | ICD-10-CM | POA: Diagnosis not present

## 2020-01-04 DIAGNOSIS — Z89611 Acquired absence of right leg above knee: Secondary | ICD-10-CM | POA: Diagnosis not present

## 2020-01-04 DIAGNOSIS — Z8631 Personal history of diabetic foot ulcer: Secondary | ICD-10-CM | POA: Diagnosis not present

## 2020-01-04 DIAGNOSIS — I130 Hypertensive heart and chronic kidney disease with heart failure and stage 1 through stage 4 chronic kidney disease, or unspecified chronic kidney disease: Secondary | ICD-10-CM | POA: Diagnosis not present

## 2020-01-04 DIAGNOSIS — E440 Moderate protein-calorie malnutrition: Secondary | ICD-10-CM | POA: Diagnosis not present

## 2020-01-04 DIAGNOSIS — G546 Phantom limb syndrome with pain: Secondary | ICD-10-CM | POA: Diagnosis not present

## 2020-01-04 DIAGNOSIS — Z602 Problems related to living alone: Secondary | ICD-10-CM | POA: Diagnosis not present

## 2020-01-04 DIAGNOSIS — Z591 Inadequate housing: Secondary | ICD-10-CM | POA: Diagnosis not present

## 2020-01-04 DIAGNOSIS — M8580 Other specified disorders of bone density and structure, unspecified site: Secondary | ICD-10-CM | POA: Diagnosis not present

## 2020-01-04 DIAGNOSIS — E1122 Type 2 diabetes mellitus with diabetic chronic kidney disease: Secondary | ICD-10-CM | POA: Diagnosis not present

## 2020-01-04 DIAGNOSIS — I509 Heart failure, unspecified: Secondary | ICD-10-CM | POA: Diagnosis not present

## 2020-01-04 DIAGNOSIS — M069 Rheumatoid arthritis, unspecified: Secondary | ICD-10-CM | POA: Diagnosis not present

## 2020-01-04 DIAGNOSIS — E1151 Type 2 diabetes mellitus with diabetic peripheral angiopathy without gangrene: Secondary | ICD-10-CM | POA: Diagnosis not present

## 2020-01-04 DIAGNOSIS — Z8739 Personal history of other diseases of the musculoskeletal system and connective tissue: Secondary | ICD-10-CM | POA: Diagnosis not present

## 2020-01-04 DIAGNOSIS — Z4781 Encounter for orthopedic aftercare following surgical amputation: Secondary | ICD-10-CM | POA: Diagnosis not present

## 2020-01-04 DIAGNOSIS — Z4801 Encounter for change or removal of surgical wound dressing: Secondary | ICD-10-CM | POA: Diagnosis not present

## 2020-01-04 DIAGNOSIS — E1142 Type 2 diabetes mellitus with diabetic polyneuropathy: Secondary | ICD-10-CM | POA: Diagnosis not present

## 2020-01-04 DIAGNOSIS — Z7951 Long term (current) use of inhaled steroids: Secondary | ICD-10-CM | POA: Diagnosis not present

## 2020-01-05 MED ORDER — FUROSEMIDE 40 MG PO TABS: 40.0000 mg | ORAL_TABLET | Freq: Every day | ORAL | 1 refills | Status: DC

## 2020-01-05 MED ORDER — LEVOTHYROXINE SODIUM 150 MCG PO TABS: 150.0000 ug | ORAL_TABLET | Freq: Every day | ORAL | 3 refills | Status: DC

## 2020-01-05 MED ORDER — FERROUS SULFATE 324 MG PO TBEC: 324.0000 mg | DELAYED_RELEASE_TABLET | Freq: Every day | ORAL | 1 refills | Status: DC

## 2020-01-05 MED ORDER — ROSUVASTATIN CALCIUM 20 MG PO TABS: 20.0000 mg | ORAL_TABLET | Freq: Every day | ORAL | 3 refills | Status: DC

## 2020-01-05 MED ORDER — GABAPENTIN 100 MG PO CAPS: 100.0000 mg | ORAL_CAPSULE | Freq: Three times a day (TID) | ORAL | 1 refills | Status: DC

## 2020-01-06 ENCOUNTER — Telehealth: Payer: Self-pay | Admitting: Family Medicine

## 2020-01-06 ENCOUNTER — Telehealth: Payer: Medicare Other

## 2020-01-06 ENCOUNTER — Other Ambulatory Visit: Payer: Self-pay

## 2020-01-06 DIAGNOSIS — I130 Hypertensive heart and chronic kidney disease with heart failure and stage 1 through stage 4 chronic kidney disease, or unspecified chronic kidney disease: Secondary | ICD-10-CM | POA: Diagnosis not present

## 2020-01-06 DIAGNOSIS — N189 Chronic kidney disease, unspecified: Secondary | ICD-10-CM | POA: Diagnosis not present

## 2020-01-06 DIAGNOSIS — Z4801 Encounter for change or removal of surgical wound dressing: Secondary | ICD-10-CM | POA: Diagnosis not present

## 2020-01-06 DIAGNOSIS — G546 Phantom limb syndrome with pain: Secondary | ICD-10-CM | POA: Diagnosis not present

## 2020-01-06 DIAGNOSIS — I509 Heart failure, unspecified: Secondary | ICD-10-CM | POA: Diagnosis not present

## 2020-01-06 DIAGNOSIS — Z993 Dependence on wheelchair: Secondary | ICD-10-CM | POA: Diagnosis not present

## 2020-01-06 DIAGNOSIS — Z89611 Acquired absence of right leg above knee: Secondary | ICD-10-CM | POA: Diagnosis not present

## 2020-01-06 DIAGNOSIS — Z4781 Encounter for orthopedic aftercare following surgical amputation: Secondary | ICD-10-CM | POA: Diagnosis not present

## 2020-01-06 DIAGNOSIS — L89151 Pressure ulcer of sacral region, stage 1: Secondary | ICD-10-CM | POA: Diagnosis not present

## 2020-01-06 DIAGNOSIS — Z602 Problems related to living alone: Secondary | ICD-10-CM | POA: Diagnosis not present

## 2020-01-06 DIAGNOSIS — Z89612 Acquired absence of left leg above knee: Secondary | ICD-10-CM | POA: Diagnosis not present

## 2020-01-06 DIAGNOSIS — Z7951 Long term (current) use of inhaled steroids: Secondary | ICD-10-CM | POA: Diagnosis not present

## 2020-01-06 DIAGNOSIS — Z8631 Personal history of diabetic foot ulcer: Secondary | ICD-10-CM | POA: Diagnosis not present

## 2020-01-06 DIAGNOSIS — E1122 Type 2 diabetes mellitus with diabetic chronic kidney disease: Secondary | ICD-10-CM | POA: Diagnosis not present

## 2020-01-06 DIAGNOSIS — M069 Rheumatoid arthritis, unspecified: Secondary | ICD-10-CM | POA: Diagnosis not present

## 2020-01-06 DIAGNOSIS — E039 Hypothyroidism, unspecified: Secondary | ICD-10-CM | POA: Diagnosis not present

## 2020-01-06 DIAGNOSIS — M8580 Other specified disorders of bone density and structure, unspecified site: Secondary | ICD-10-CM | POA: Diagnosis not present

## 2020-01-06 DIAGNOSIS — E440 Moderate protein-calorie malnutrition: Secondary | ICD-10-CM | POA: Diagnosis not present

## 2020-01-06 DIAGNOSIS — E1151 Type 2 diabetes mellitus with diabetic peripheral angiopathy without gangrene: Secondary | ICD-10-CM | POA: Diagnosis not present

## 2020-01-06 DIAGNOSIS — Z591 Inadequate housing: Secondary | ICD-10-CM | POA: Diagnosis not present

## 2020-01-06 DIAGNOSIS — E1142 Type 2 diabetes mellitus with diabetic polyneuropathy: Secondary | ICD-10-CM | POA: Diagnosis not present

## 2020-01-06 DIAGNOSIS — Z8739 Personal history of other diseases of the musculoskeletal system and connective tissue: Secondary | ICD-10-CM | POA: Diagnosis not present

## 2020-01-06 NOTE — Chronic Care Management (AMB) (Signed)
  Care Management   Note  01/06/2020 Name: [name] MRN: [id number] DOB: [birth year]  [name] is a 74 y.o. year old female who is a primary care patient of [name], MD and is actively engaged with the care management team. I reached out to [name] by phone today to assist with re-scheduling a follow up visit with the RN Case Manager.  Follow up plan: Telephone appointment with care management team member scheduled for:01/07/2020  [place], [place] Management  [place], [place] [address] Direct Dial: [email or web address] Website: [email or web address]

## 2020-01-07 ENCOUNTER — Ambulatory Visit: Payer: Medicare Other

## 2020-01-07 ENCOUNTER — Ambulatory Visit: Payer: Self-pay | Admitting: Licensed Clinical Social Worker

## 2020-01-07 ENCOUNTER — Other Ambulatory Visit: Payer: Self-pay

## 2020-01-07 NOTE — Chronic Care Management (AMB) (Signed)
Social Work  Care Management Collaboration 01/07/2020 Name: [name] MRN: [id number] DOB: 1945/11/02 [name] is a 74 y.o. year old female who sees [name], Delorse Limber, MD for primary care.  Intervention: Patient was not interviewed or contacted during this encounter.  LCSW collaborated with CCM RN for ongoing needs . Review of patient status, including review of consultants reports, relevant laboratory and other test results, and collaboration with appropriate care team members and the patient's provider was performed as part of comprehensive patient evaluation and provision of chronic care management services.   Goals Addressed            This Visit's Progress   . I need a wheelchair ramp   On track    [place] (see longitudinal plan of care for additional care plan information)  Current Barriers & progress:  . Patient needs community resources to assist with wheelchair ramp  . Acknowledges deficits and needs support, education and care coordination in order to meet this unmet need  . Patient received voice message about ramp from agency, has called several times but unable to speak to anyone . Patient has spoken with Butch Penny from [employer] about her ramp. Informed patient what she needs to provide to move forward Clinical Goal(s)  . Over the next 160 days, patient will work with LCSW to address concerns related to wheelchair ramp Interventions provided by LCSW:  . Assessed patient's care coordination needs with barriers to connecting with agency to assist with ramp . Provided patient with information about agencies that assist with home repair and wheelchair ramps  . Provided patient with contact information for wheelchair ramps with [employer] and [employer] also received consent to submit referral for both via NC360 . Collaborated with CCM RN for patient's needs Patient Self Care Activities & Deficits:  . Patient is  unable to independently navigate community resource options without care coordination support  . Acknowledges deficits and is motivated to resolve concern  . Patient will provided needed information  Please see past updates related to this goal by clicking on the "Past Updates" button in the selected goal      . COMPLETED: meals on wheels       CARE PLAN ENTRY (see longitudinal plan of care for additional care plan information)  Current Barriers & Progress:  . Patient needs community resources for meals on wheels . Acknowledges deficits and needs support, education and care coordination in order to meet this unmet need  . Patient is unable to prepare meals and rely's on grandson to bring her fast food . Patient is on wait list for mobile meals . Patient is also receiving Moms meals until approved for Meals on wheels Clinical Goal(s)  . Over the next 120 days patient will have food needs met by meals on wheels Interventions provided by LCSW:  . Assessed patient's care coordination needs related to healthy food . patient on wait list effective April 13th for mobile meals . Provided patient with information about mom's meal program to bridge gap while on wait list for mobile meals . Collaborated with CCM RN for ongoing patient needs Patient Self Care Activities & Deficits:  . Patient is unable to independently navigate community resource options without care coordination support  . Acknowledges deficits and is motivated to resolve concern  Please see past updates related to this goal by clicking on the "Past Updates" button in the selected goal  Plan: CCM team will continue to collaborate and follow patient for ongoing needs.  [name], [place] / [place]   [phone] 11:32 AM

## 2020-01-10 DIAGNOSIS — M069 Rheumatoid arthritis, unspecified: Secondary | ICD-10-CM | POA: Diagnosis not present

## 2020-01-10 DIAGNOSIS — E1142 Type 2 diabetes mellitus with diabetic polyneuropathy: Secondary | ICD-10-CM | POA: Diagnosis not present

## 2020-01-10 DIAGNOSIS — Z4801 Encounter for change or removal of surgical wound dressing: Secondary | ICD-10-CM | POA: Diagnosis not present

## 2020-01-10 DIAGNOSIS — I509 Heart failure, unspecified: Secondary | ICD-10-CM | POA: Diagnosis not present

## 2020-01-10 DIAGNOSIS — L89151 Pressure ulcer of sacral region, stage 1: Secondary | ICD-10-CM | POA: Diagnosis not present

## 2020-01-10 DIAGNOSIS — Z591 Inadequate housing: Secondary | ICD-10-CM | POA: Diagnosis not present

## 2020-01-10 DIAGNOSIS — Z8631 Personal history of diabetic foot ulcer: Secondary | ICD-10-CM | POA: Diagnosis not present

## 2020-01-10 DIAGNOSIS — Z8739 Personal history of other diseases of the musculoskeletal system and connective tissue: Secondary | ICD-10-CM | POA: Diagnosis not present

## 2020-01-10 DIAGNOSIS — N189 Chronic kidney disease, unspecified: Secondary | ICD-10-CM | POA: Diagnosis not present

## 2020-01-10 DIAGNOSIS — Z4781 Encounter for orthopedic aftercare following surgical amputation: Secondary | ICD-10-CM | POA: Diagnosis not present

## 2020-01-10 DIAGNOSIS — M8580 Other specified disorders of bone density and structure, unspecified site: Secondary | ICD-10-CM | POA: Diagnosis not present

## 2020-01-10 DIAGNOSIS — Z89612 Acquired absence of left leg above knee: Secondary | ICD-10-CM | POA: Diagnosis not present

## 2020-01-10 DIAGNOSIS — Z89611 Acquired absence of right leg above knee: Secondary | ICD-10-CM | POA: Diagnosis not present

## 2020-01-10 DIAGNOSIS — Z7951 Long term (current) use of inhaled steroids: Secondary | ICD-10-CM | POA: Diagnosis not present

## 2020-01-10 DIAGNOSIS — Z602 Problems related to living alone: Secondary | ICD-10-CM | POA: Diagnosis not present

## 2020-01-10 DIAGNOSIS — E1151 Type 2 diabetes mellitus with diabetic peripheral angiopathy without gangrene: Secondary | ICD-10-CM | POA: Diagnosis not present

## 2020-01-10 DIAGNOSIS — I130 Hypertensive heart and chronic kidney disease with heart failure and stage 1 through stage 4 chronic kidney disease, or unspecified chronic kidney disease: Secondary | ICD-10-CM | POA: Diagnosis not present

## 2020-01-10 DIAGNOSIS — G546 Phantom limb syndrome with pain: Secondary | ICD-10-CM | POA: Diagnosis not present

## 2020-01-10 DIAGNOSIS — Z993 Dependence on wheelchair: Secondary | ICD-10-CM | POA: Diagnosis not present

## 2020-01-10 DIAGNOSIS — E440 Moderate protein-calorie malnutrition: Secondary | ICD-10-CM | POA: Diagnosis not present

## 2020-01-10 DIAGNOSIS — E039 Hypothyroidism, unspecified: Secondary | ICD-10-CM | POA: Diagnosis not present

## 2020-01-10 DIAGNOSIS — E1122 Type 2 diabetes mellitus with diabetic chronic kidney disease: Secondary | ICD-10-CM | POA: Diagnosis not present

## 2020-01-11 DIAGNOSIS — Z8739 Personal history of other diseases of the musculoskeletal system and connective tissue: Secondary | ICD-10-CM | POA: Diagnosis not present

## 2020-01-11 DIAGNOSIS — Z89611 Acquired absence of right leg above knee: Secondary | ICD-10-CM | POA: Diagnosis not present

## 2020-01-11 DIAGNOSIS — Z993 Dependence on wheelchair: Secondary | ICD-10-CM | POA: Diagnosis not present

## 2020-01-11 DIAGNOSIS — L89151 Pressure ulcer of sacral region, stage 1: Secondary | ICD-10-CM | POA: Diagnosis not present

## 2020-01-11 DIAGNOSIS — M069 Rheumatoid arthritis, unspecified: Secondary | ICD-10-CM | POA: Diagnosis not present

## 2020-01-11 DIAGNOSIS — Z591 Inadequate housing: Secondary | ICD-10-CM | POA: Diagnosis not present

## 2020-01-11 DIAGNOSIS — Z4801 Encounter for change or removal of surgical wound dressing: Secondary | ICD-10-CM | POA: Diagnosis not present

## 2020-01-11 DIAGNOSIS — G546 Phantom limb syndrome with pain: Secondary | ICD-10-CM | POA: Diagnosis not present

## 2020-01-11 DIAGNOSIS — I130 Hypertensive heart and chronic kidney disease with heart failure and stage 1 through stage 4 chronic kidney disease, or unspecified chronic kidney disease: Secondary | ICD-10-CM | POA: Diagnosis not present

## 2020-01-11 DIAGNOSIS — Z89612 Acquired absence of left leg above knee: Secondary | ICD-10-CM | POA: Diagnosis not present

## 2020-01-11 DIAGNOSIS — Z7951 Long term (current) use of inhaled steroids: Secondary | ICD-10-CM | POA: Diagnosis not present

## 2020-01-11 DIAGNOSIS — E440 Moderate protein-calorie malnutrition: Secondary | ICD-10-CM | POA: Diagnosis not present

## 2020-01-11 DIAGNOSIS — E1142 Type 2 diabetes mellitus with diabetic polyneuropathy: Secondary | ICD-10-CM | POA: Diagnosis not present

## 2020-01-11 DIAGNOSIS — N189 Chronic kidney disease, unspecified: Secondary | ICD-10-CM | POA: Diagnosis not present

## 2020-01-11 DIAGNOSIS — Z4781 Encounter for orthopedic aftercare following surgical amputation: Secondary | ICD-10-CM | POA: Diagnosis not present

## 2020-01-11 DIAGNOSIS — Z8631 Personal history of diabetic foot ulcer: Secondary | ICD-10-CM | POA: Diagnosis not present

## 2020-01-11 DIAGNOSIS — E1122 Type 2 diabetes mellitus with diabetic chronic kidney disease: Secondary | ICD-10-CM | POA: Diagnosis not present

## 2020-01-11 DIAGNOSIS — E1151 Type 2 diabetes mellitus with diabetic peripheral angiopathy without gangrene: Secondary | ICD-10-CM | POA: Diagnosis not present

## 2020-01-11 DIAGNOSIS — Z602 Problems related to living alone: Secondary | ICD-10-CM | POA: Diagnosis not present

## 2020-01-11 DIAGNOSIS — M8580 Other specified disorders of bone density and structure, unspecified site: Secondary | ICD-10-CM | POA: Diagnosis not present

## 2020-01-11 DIAGNOSIS — I509 Heart failure, unspecified: Secondary | ICD-10-CM | POA: Diagnosis not present

## 2020-01-11 DIAGNOSIS — E039 Hypothyroidism, unspecified: Secondary | ICD-10-CM | POA: Diagnosis not present

## 2020-01-13 ENCOUNTER — Telehealth: Payer: Self-pay

## 2020-01-13 DIAGNOSIS — I130 Hypertensive heart and chronic kidney disease with heart failure and stage 1 through stage 4 chronic kidney disease, or unspecified chronic kidney disease: Secondary | ICD-10-CM | POA: Diagnosis not present

## 2020-01-13 DIAGNOSIS — Z993 Dependence on wheelchair: Secondary | ICD-10-CM | POA: Diagnosis not present

## 2020-01-13 DIAGNOSIS — Z8631 Personal history of diabetic foot ulcer: Secondary | ICD-10-CM | POA: Diagnosis not present

## 2020-01-13 DIAGNOSIS — L89151 Pressure ulcer of sacral region, stage 1: Secondary | ICD-10-CM | POA: Diagnosis not present

## 2020-01-13 DIAGNOSIS — E440 Moderate protein-calorie malnutrition: Secondary | ICD-10-CM | POA: Diagnosis not present

## 2020-01-13 DIAGNOSIS — I509 Heart failure, unspecified: Secondary | ICD-10-CM | POA: Diagnosis not present

## 2020-01-13 DIAGNOSIS — E1142 Type 2 diabetes mellitus with diabetic polyneuropathy: Secondary | ICD-10-CM | POA: Diagnosis not present

## 2020-01-13 DIAGNOSIS — G546 Phantom limb syndrome with pain: Secondary | ICD-10-CM | POA: Diagnosis not present

## 2020-01-13 DIAGNOSIS — E1151 Type 2 diabetes mellitus with diabetic peripheral angiopathy without gangrene: Secondary | ICD-10-CM | POA: Diagnosis not present

## 2020-01-13 DIAGNOSIS — Z8739 Personal history of other diseases of the musculoskeletal system and connective tissue: Secondary | ICD-10-CM | POA: Diagnosis not present

## 2020-01-13 DIAGNOSIS — M8580 Other specified disorders of bone density and structure, unspecified site: Secondary | ICD-10-CM | POA: Diagnosis not present

## 2020-01-13 DIAGNOSIS — Z591 Inadequate housing: Secondary | ICD-10-CM | POA: Diagnosis not present

## 2020-01-13 DIAGNOSIS — E1122 Type 2 diabetes mellitus with diabetic chronic kidney disease: Secondary | ICD-10-CM | POA: Diagnosis not present

## 2020-01-13 DIAGNOSIS — Z4781 Encounter for orthopedic aftercare following surgical amputation: Secondary | ICD-10-CM | POA: Diagnosis not present

## 2020-01-13 DIAGNOSIS — Z89611 Acquired absence of right leg above knee: Secondary | ICD-10-CM | POA: Diagnosis not present

## 2020-01-13 DIAGNOSIS — E039 Hypothyroidism, unspecified: Secondary | ICD-10-CM | POA: Diagnosis not present

## 2020-01-13 DIAGNOSIS — Z89612 Acquired absence of left leg above knee: Secondary | ICD-10-CM | POA: Diagnosis not present

## 2020-01-13 DIAGNOSIS — Z602 Problems related to living alone: Secondary | ICD-10-CM | POA: Diagnosis not present

## 2020-01-13 DIAGNOSIS — Z7951 Long term (current) use of inhaled steroids: Secondary | ICD-10-CM | POA: Diagnosis not present

## 2020-01-13 DIAGNOSIS — N189 Chronic kidney disease, unspecified: Secondary | ICD-10-CM | POA: Diagnosis not present

## 2020-01-13 DIAGNOSIS — M069 Rheumatoid arthritis, unspecified: Secondary | ICD-10-CM | POA: Diagnosis not present

## 2020-01-13 DIAGNOSIS — Z4801 Encounter for change or removal of surgical wound dressing: Secondary | ICD-10-CM | POA: Diagnosis not present

## 2020-01-13 NOTE — Telephone Encounter (Signed)
Caryl Pina, home health nurse, calls nurse line to report abnormal findings. Caryl Pina reports crackling in both lungs, no cough or SOB. Caryl Pina reports she has been following this for a few weeks and originally started with crackling in lower right lobe. Caryl Pina states she is unsure if fluid is building up, patient is unable to weigh. Caryl Pina stated Dody was out of lasix for 2 weeks, has since started taking again as of 3 days ago. Caryl Pina does not notice any change. Caryl Pina plans to go back on Monday. I advised an apt, however the patient can not come into the office, per [place]. Will forward to PCP to see if a virtual would be appropriate or any orders she would like done Monday.

## 2020-01-14 ENCOUNTER — Ambulatory Visit: Payer: Medicare Other | Admitting: Gastroenterology

## 2020-01-14 NOTE — Telephone Encounter (Signed)
If [facility] is concerned, patient should be seen in office Hopefully will improve as she has restarted her lasix If patient has any shortness of breath needs to contact us directly  Thanks [name], MD

## 2020-01-17 DIAGNOSIS — G546 Phantom limb syndrome with pain: Secondary | ICD-10-CM | POA: Diagnosis not present

## 2020-01-17 DIAGNOSIS — M069 Rheumatoid arthritis, unspecified: Secondary | ICD-10-CM | POA: Diagnosis not present

## 2020-01-17 DIAGNOSIS — I509 Heart failure, unspecified: Secondary | ICD-10-CM | POA: Diagnosis not present

## 2020-01-17 DIAGNOSIS — Z602 Problems related to living alone: Secondary | ICD-10-CM | POA: Diagnosis not present

## 2020-01-17 DIAGNOSIS — Z89612 Acquired absence of left leg above knee: Secondary | ICD-10-CM | POA: Diagnosis not present

## 2020-01-17 DIAGNOSIS — Z993 Dependence on wheelchair: Secondary | ICD-10-CM | POA: Diagnosis not present

## 2020-01-17 DIAGNOSIS — Z7951 Long term (current) use of inhaled steroids: Secondary | ICD-10-CM | POA: Diagnosis not present

## 2020-01-17 DIAGNOSIS — E440 Moderate protein-calorie malnutrition: Secondary | ICD-10-CM | POA: Diagnosis not present

## 2020-01-17 DIAGNOSIS — Z4781 Encounter for orthopedic aftercare following surgical amputation: Secondary | ICD-10-CM | POA: Diagnosis not present

## 2020-01-17 DIAGNOSIS — E1151 Type 2 diabetes mellitus with diabetic peripheral angiopathy without gangrene: Secondary | ICD-10-CM | POA: Diagnosis not present

## 2020-01-17 DIAGNOSIS — N189 Chronic kidney disease, unspecified: Secondary | ICD-10-CM | POA: Diagnosis not present

## 2020-01-17 DIAGNOSIS — Z8631 Personal history of diabetic foot ulcer: Secondary | ICD-10-CM | POA: Diagnosis not present

## 2020-01-17 DIAGNOSIS — Z591 Inadequate housing: Secondary | ICD-10-CM | POA: Diagnosis not present

## 2020-01-17 DIAGNOSIS — I130 Hypertensive heart and chronic kidney disease with heart failure and stage 1 through stage 4 chronic kidney disease, or unspecified chronic kidney disease: Secondary | ICD-10-CM | POA: Diagnosis not present

## 2020-01-17 DIAGNOSIS — E1122 Type 2 diabetes mellitus with diabetic chronic kidney disease: Secondary | ICD-10-CM | POA: Diagnosis not present

## 2020-01-17 DIAGNOSIS — Z4801 Encounter for change or removal of surgical wound dressing: Secondary | ICD-10-CM | POA: Diagnosis not present

## 2020-01-17 DIAGNOSIS — E1142 Type 2 diabetes mellitus with diabetic polyneuropathy: Secondary | ICD-10-CM | POA: Diagnosis not present

## 2020-01-17 DIAGNOSIS — Z89611 Acquired absence of right leg above knee: Secondary | ICD-10-CM | POA: Diagnosis not present

## 2020-01-17 DIAGNOSIS — M8580 Other specified disorders of bone density and structure, unspecified site: Secondary | ICD-10-CM | POA: Diagnosis not present

## 2020-01-17 DIAGNOSIS — L89151 Pressure ulcer of sacral region, stage 1: Secondary | ICD-10-CM | POA: Diagnosis not present

## 2020-01-17 DIAGNOSIS — Z8739 Personal history of other diseases of the musculoskeletal system and connective tissue: Secondary | ICD-10-CM | POA: Diagnosis not present

## 2020-01-17 DIAGNOSIS — E039 Hypothyroidism, unspecified: Secondary | ICD-10-CM | POA: Diagnosis not present

## 2020-01-18 DIAGNOSIS — Z4801 Encounter for change or removal of surgical wound dressing: Secondary | ICD-10-CM | POA: Diagnosis not present

## 2020-01-18 DIAGNOSIS — E039 Hypothyroidism, unspecified: Secondary | ICD-10-CM | POA: Diagnosis not present

## 2020-01-18 DIAGNOSIS — Z4781 Encounter for orthopedic aftercare following surgical amputation: Secondary | ICD-10-CM | POA: Diagnosis not present

## 2020-01-18 DIAGNOSIS — Z89612 Acquired absence of left leg above knee: Secondary | ICD-10-CM | POA: Diagnosis not present

## 2020-01-18 DIAGNOSIS — N189 Chronic kidney disease, unspecified: Secondary | ICD-10-CM | POA: Diagnosis not present

## 2020-01-18 DIAGNOSIS — M8580 Other specified disorders of bone density and structure, unspecified site: Secondary | ICD-10-CM | POA: Diagnosis not present

## 2020-01-18 DIAGNOSIS — E1151 Type 2 diabetes mellitus with diabetic peripheral angiopathy without gangrene: Secondary | ICD-10-CM | POA: Diagnosis not present

## 2020-01-18 DIAGNOSIS — Z993 Dependence on wheelchair: Secondary | ICD-10-CM | POA: Diagnosis not present

## 2020-01-18 DIAGNOSIS — E440 Moderate protein-calorie malnutrition: Secondary | ICD-10-CM | POA: Diagnosis not present

## 2020-01-18 DIAGNOSIS — I509 Heart failure, unspecified: Secondary | ICD-10-CM | POA: Diagnosis not present

## 2020-01-18 DIAGNOSIS — E1142 Type 2 diabetes mellitus with diabetic polyneuropathy: Secondary | ICD-10-CM | POA: Diagnosis not present

## 2020-01-18 DIAGNOSIS — Z7951 Long term (current) use of inhaled steroids: Secondary | ICD-10-CM | POA: Diagnosis not present

## 2020-01-18 DIAGNOSIS — Z602 Problems related to living alone: Secondary | ICD-10-CM | POA: Diagnosis not present

## 2020-01-18 DIAGNOSIS — M069 Rheumatoid arthritis, unspecified: Secondary | ICD-10-CM | POA: Diagnosis not present

## 2020-01-18 DIAGNOSIS — L89151 Pressure ulcer of sacral region, stage 1: Secondary | ICD-10-CM | POA: Diagnosis not present

## 2020-01-18 DIAGNOSIS — Z8631 Personal history of diabetic foot ulcer: Secondary | ICD-10-CM | POA: Diagnosis not present

## 2020-01-18 DIAGNOSIS — I130 Hypertensive heart and chronic kidney disease with heart failure and stage 1 through stage 4 chronic kidney disease, or unspecified chronic kidney disease: Secondary | ICD-10-CM | POA: Diagnosis not present

## 2020-01-18 DIAGNOSIS — Z591 Inadequate housing: Secondary | ICD-10-CM | POA: Diagnosis not present

## 2020-01-18 DIAGNOSIS — G546 Phantom limb syndrome with pain: Secondary | ICD-10-CM | POA: Diagnosis not present

## 2020-01-18 DIAGNOSIS — Z8739 Personal history of other diseases of the musculoskeletal system and connective tissue: Secondary | ICD-10-CM | POA: Diagnosis not present

## 2020-01-18 DIAGNOSIS — E1122 Type 2 diabetes mellitus with diabetic chronic kidney disease: Secondary | ICD-10-CM | POA: Diagnosis not present

## 2020-01-18 DIAGNOSIS — Z89611 Acquired absence of right leg above knee: Secondary | ICD-10-CM | POA: Diagnosis not present

## 2020-01-19 ENCOUNTER — Telehealth: Payer: Self-pay

## 2020-01-19 NOTE — Telephone Encounter (Signed)
Called and left voice mail message for patient to call office if she needed to be seen.  [name], [place]

## 2020-01-20 DIAGNOSIS — Z4801 Encounter for change or removal of surgical wound dressing: Secondary | ICD-10-CM | POA: Diagnosis not present

## 2020-01-20 DIAGNOSIS — Z591 Inadequate housing: Secondary | ICD-10-CM | POA: Diagnosis not present

## 2020-01-20 DIAGNOSIS — I509 Heart failure, unspecified: Secondary | ICD-10-CM | POA: Diagnosis not present

## 2020-01-20 DIAGNOSIS — Z4781 Encounter for orthopedic aftercare following surgical amputation: Secondary | ICD-10-CM | POA: Diagnosis not present

## 2020-01-20 DIAGNOSIS — E039 Hypothyroidism, unspecified: Secondary | ICD-10-CM | POA: Diagnosis not present

## 2020-01-20 DIAGNOSIS — Z993 Dependence on wheelchair: Secondary | ICD-10-CM | POA: Diagnosis not present

## 2020-01-20 DIAGNOSIS — Z8631 Personal history of diabetic foot ulcer: Secondary | ICD-10-CM | POA: Diagnosis not present

## 2020-01-20 DIAGNOSIS — N189 Chronic kidney disease, unspecified: Secondary | ICD-10-CM | POA: Diagnosis not present

## 2020-01-20 DIAGNOSIS — Z89611 Acquired absence of right leg above knee: Secondary | ICD-10-CM | POA: Diagnosis not present

## 2020-01-20 DIAGNOSIS — G546 Phantom limb syndrome with pain: Secondary | ICD-10-CM | POA: Diagnosis not present

## 2020-01-20 DIAGNOSIS — L89151 Pressure ulcer of sacral region, stage 1: Secondary | ICD-10-CM | POA: Diagnosis not present

## 2020-01-20 DIAGNOSIS — E1122 Type 2 diabetes mellitus with diabetic chronic kidney disease: Secondary | ICD-10-CM | POA: Diagnosis not present

## 2020-01-20 DIAGNOSIS — Z602 Problems related to living alone: Secondary | ICD-10-CM | POA: Diagnosis not present

## 2020-01-20 DIAGNOSIS — E1151 Type 2 diabetes mellitus with diabetic peripheral angiopathy without gangrene: Secondary | ICD-10-CM | POA: Diagnosis not present

## 2020-01-20 DIAGNOSIS — I130 Hypertensive heart and chronic kidney disease with heart failure and stage 1 through stage 4 chronic kidney disease, or unspecified chronic kidney disease: Secondary | ICD-10-CM | POA: Diagnosis not present

## 2020-01-20 DIAGNOSIS — Z8739 Personal history of other diseases of the musculoskeletal system and connective tissue: Secondary | ICD-10-CM | POA: Diagnosis not present

## 2020-01-20 DIAGNOSIS — E440 Moderate protein-calorie malnutrition: Secondary | ICD-10-CM | POA: Diagnosis not present

## 2020-01-20 DIAGNOSIS — Z89612 Acquired absence of left leg above knee: Secondary | ICD-10-CM | POA: Diagnosis not present

## 2020-01-20 DIAGNOSIS — E1142 Type 2 diabetes mellitus with diabetic polyneuropathy: Secondary | ICD-10-CM | POA: Diagnosis not present

## 2020-01-20 DIAGNOSIS — M069 Rheumatoid arthritis, unspecified: Secondary | ICD-10-CM | POA: Diagnosis not present

## 2020-01-20 DIAGNOSIS — Z7951 Long term (current) use of inhaled steroids: Secondary | ICD-10-CM | POA: Diagnosis not present

## 2020-01-20 DIAGNOSIS — M8580 Other specified disorders of bone density and structure, unspecified site: Secondary | ICD-10-CM | POA: Diagnosis not present

## 2020-01-21 ENCOUNTER — Ambulatory Visit: Payer: Medicare Other | Admitting: Licensed Clinical Social Worker

## 2020-01-21 DIAGNOSIS — I130 Hypertensive heart and chronic kidney disease with heart failure and stage 1 through stage 4 chronic kidney disease, or unspecified chronic kidney disease: Secondary | ICD-10-CM | POA: Diagnosis not present

## 2020-01-21 DIAGNOSIS — Z591 Inadequate housing: Secondary | ICD-10-CM | POA: Diagnosis not present

## 2020-01-21 DIAGNOSIS — Z89611 Acquired absence of right leg above knee: Secondary | ICD-10-CM | POA: Diagnosis not present

## 2020-01-21 DIAGNOSIS — Z4781 Encounter for orthopedic aftercare following surgical amputation: Secondary | ICD-10-CM | POA: Diagnosis not present

## 2020-01-21 DIAGNOSIS — M8580 Other specified disorders of bone density and structure, unspecified site: Secondary | ICD-10-CM | POA: Diagnosis not present

## 2020-01-21 DIAGNOSIS — E1151 Type 2 diabetes mellitus with diabetic peripheral angiopathy without gangrene: Secondary | ICD-10-CM | POA: Diagnosis not present

## 2020-01-21 DIAGNOSIS — G546 Phantom limb syndrome with pain: Secondary | ICD-10-CM | POA: Diagnosis not present

## 2020-01-21 DIAGNOSIS — Z7951 Long term (current) use of inhaled steroids: Secondary | ICD-10-CM | POA: Diagnosis not present

## 2020-01-21 DIAGNOSIS — Z8631 Personal history of diabetic foot ulcer: Secondary | ICD-10-CM | POA: Diagnosis not present

## 2020-01-21 DIAGNOSIS — Z993 Dependence on wheelchair: Secondary | ICD-10-CM | POA: Diagnosis not present

## 2020-01-21 DIAGNOSIS — E1122 Type 2 diabetes mellitus with diabetic chronic kidney disease: Secondary | ICD-10-CM | POA: Diagnosis not present

## 2020-01-21 DIAGNOSIS — Z602 Problems related to living alone: Secondary | ICD-10-CM | POA: Diagnosis not present

## 2020-01-21 DIAGNOSIS — Z8739 Personal history of other diseases of the musculoskeletal system and connective tissue: Secondary | ICD-10-CM | POA: Diagnosis not present

## 2020-01-21 DIAGNOSIS — M069 Rheumatoid arthritis, unspecified: Secondary | ICD-10-CM | POA: Diagnosis not present

## 2020-01-21 DIAGNOSIS — Z4801 Encounter for change or removal of surgical wound dressing: Secondary | ICD-10-CM | POA: Diagnosis not present

## 2020-01-21 DIAGNOSIS — E039 Hypothyroidism, unspecified: Secondary | ICD-10-CM | POA: Diagnosis not present

## 2020-01-21 DIAGNOSIS — E1142 Type 2 diabetes mellitus with diabetic polyneuropathy: Secondary | ICD-10-CM | POA: Diagnosis not present

## 2020-01-21 DIAGNOSIS — E440 Moderate protein-calorie malnutrition: Secondary | ICD-10-CM | POA: Diagnosis not present

## 2020-01-21 DIAGNOSIS — I509 Heart failure, unspecified: Secondary | ICD-10-CM | POA: Diagnosis not present

## 2020-01-21 DIAGNOSIS — L89151 Pressure ulcer of sacral region, stage 1: Secondary | ICD-10-CM | POA: Diagnosis not present

## 2020-01-21 DIAGNOSIS — N189 Chronic kidney disease, unspecified: Secondary | ICD-10-CM | POA: Diagnosis not present

## 2020-01-21 DIAGNOSIS — Z89612 Acquired absence of left leg above knee: Secondary | ICD-10-CM | POA: Diagnosis not present

## 2020-01-21 DIAGNOSIS — Z7189 Other specified counseling: Secondary | ICD-10-CM

## 2020-01-21 NOTE — Patient Instructions (Signed)
Licensed Clinical Social Worker Visit Information Ms. Reali  it was nice speaking with you. Please call me directly if you have questions [phone] Goals we discussed today:  Goals Addressed            This Visit's Progress   . advance directives       CARE PLAN ENTRY (see longitudinal plan of care for additional care plan information)  Current Barriers:  . Patient  does not have an Forensic scientist . Acknowledges deficits, education and support in order to complete this document . Limited education about the importance of naming a healthcare power of attorney Clinical Social Work Goal(s):  Marland Kitchen Over the next 20 days, the patient will review mailed EMMI education on Advance Directive as evidenced by patient self report of review . Over the next 30 days, with assistance of LCSW, the patient will complete mailed Advance Directive packet  . Over the next 45 days, the patient will  work with care management team on completion of Advance Directive, notarize and provide a copy to provider office Interventions provided by LCSW: . A voluntary discussion about advanced care planning including importance of advanced directives, healthcare proxy and living will was discussed with the patient.  . Mailed the patient an EMMI educational information on Advance Directives as well as an Emergency planning/management officer . Advised patient to review information mailed by LCSW Patient Self Care Activities:  . Is able to complete documentation independently . Able to identify next of kin or [place] of Attorney/Health Care Agent Initial goal documentation      . I need a wheelchair ramp   Not on track    [place] (see longitudinal plan of care for additional care plan information)  Current Barriers & progress:  . Patient needs community resources to assist with wheelchair ramp  . Acknowledges deficits and needs support, education and care coordination in order to meet this unmet need   . Patient received voice message about ramp from agency, has called several times but unable to speak to anyone . Patient has spoken with Butch Penny from [employer] about her ramp. Informed patient what she needs to provide to move forward . Patient understands what is needed will mail information in enveloped provided by agency  Clinical Goal(s)  . Over the next 160 days, patient will work with LCSW to address concerns related to wheelchair ramp Interventions provided by LCSW:  . Assessed patient's care coordination needs with barriers to connecting with ramp and answered all questions . Provided patient with contact information for aging gracefully program  Patient Self Care Activities & Deficits:  . Patient is unable to independently navigate community resource options without care coordination support  . Acknowledges deficits and is motivated to resolve concern  . Patient will provided needed information  Please see past updates related to this goal by clicking on the "Past Updates" button in the selected goal       Materials provided: Yes: advance directive Ms. Napierkowski received Care Management services today:  1. Care Management services include personalized support from designated clinical staff supervised by her physician, including individualized plan of care and coordination with other care providers 2. 24/7 contact [phone] for assistance for urgent and routine care needs. 3. Care Management are voluntary services and be declined at any time by calling the office.  Patient agreed to services and verbal consent obtained.    Patient verbalizes understanding of instructions provided today.  Follow up plan:  SW will follow up with patient by phone over the next 2 to 3 weeks  Maurine Cane, LCSW

## 2020-01-21 NOTE — Chronic Care Management (AMB) (Signed)
Care Management   Clinical Social Work Follow Up   01/21/2020 Name: [name] MRN: [id number] DOB: 11/04/1945 Referred by: Kari Rio, MD  Reason for referral : Care Coordination (ramp & advance directives) [name] is a 74 y.o. year old female who is a primary care patient of Kari Mems Delorse Limber, MD.   Reason for follow-up: Phone encounter with patient today for ongoing assessment of needs.   Assessment: Patient is pleasant and thankful for assistance from CCM tea. Patient is making progress towards goal.  States she is connected to program for her ramp and will be providing them with the information they requested.  Patient is eating healthier due to the meals we had delivered and has cut down on eating fast food. Reports how much she enjoys the meals.  Intervention:A voluntary and extensive discussion about advanced care planning including explanation and discussion of advanced was undertaken with the patient. Explanation regarding healthcare proxy and living will was reviewed and packet with forms with explanation of how to fill them out was given. Other interventions are task centered and   Solution-Focused Strategies  Plan: LCSW will F/U with patient in 3 weeks to review advance directive packet mailed today  Review of patient status, including review of consultants reports, relevant laboratory and other test results, and collaboration with appropriate care team members and the patient's provider was performed as part of comprehensive patient evaluation and provision of care management services.    Advance Directive Status: N See Care Plan and Vynca application for related entries. SDOH (Social Determinants of Health) assessments performed:No needs identified   Goals Addressed            This Visit's Progress   . advance directives       CARE PLAN ENTRY (see longitudinal plan of care for additional care plan information)  Current Barriers:  . Patient  does  not have an Forensic scientist . Acknowledges deficits, education and support in order to complete this document . Limited education about the importance of naming a healthcare power of attorney Clinical Social Work Goal(s):  [name] Kitchen Over the next 20 days, the patient will review mailed EMMI education on Advance Directive as evidenced by patient self report of review . Over the next 30 days, with assistance of LCSW, the patient will complete mailed Advance Directive packet  . Over the next 45 days, the patient will  work with care management team on completion of Advance Directive, notarize and provide a copy to provider office Interventions provided by LCSW: . A voluntary discussion about advanced care planning including importance of advanced directives, healthcare proxy and living will was discussed with the patient.  . Mailed the patient an EMMI educational information on Advance Directives as well as an Emergency planning/management officer . Advised patient to review information mailed by LCSW Patient Self Care Activities:  . Is able to complete documentation independently . Able to identify next of kin or [place] of Attorney/Health Care Agent Initial goal documentation     . I need a wheelchair ramp   Not on track    [place] (see longitudinal plan of care for additional care plan information)  Current Barriers & progress:  . Patient needs community resources to assist with wheelchair ramp  . Acknowledges deficits and needs support, education and care coordination in order to meet this unmet need  . Patient received voice message about ramp from agency, has called several times but unable to  speak to anyone . Patient has spoken with [name] from [employer] about her ramp. Informed patient what she needs to provide to move forward . Patient understands what is needed will mail information in enveloped provided by agency  Clinical Goal(s)  . Over the next 160 days, patient will work  with LCSW to address concerns related to wheelchair ramp Interventions provided by LCSW:  . Assessed patient's care coordination needs with barriers to connecting with ramp and answered all questions . Provided patient with contact information for aging gracefully program  Patient Self Care Activities & Deficits:  . Patient is unable to independently navigate community resource options without care coordination support  . Acknowledges deficits and is motivated to resolve concern  . Patient will provided needed information  Please see past updates related to this goal by clicking on the "Past Updates" button in the selected goal        Outpatient Encounter Medications as of 01/21/2020  Medication Sig  . acetaminophen (TYLENOL) 325 MG tablet Take 2 tablets (650 mg total) by mouth every 6 (six) hours.  . Blood Glucose Monitoring Suppl (ONETOUCH VERIO) w/Device KIT Check sugar 3 times per day  . feeding supplement, ENSURE ENLIVE, (ENSURE ENLIVE) LIQD Take 237 mLs by mouth 3 (three) times daily between meals.  . ferrous sulfate 324 MG TBEC Take 1 tablet (324 mg total) by mouth daily with breakfast.  . furosemide (LASIX) 40 MG tablet Take 1 tablet (40 mg total) by mouth daily.  [name] Kitchen gabapentin (NEURONTIN) 100 MG capsule Take 1 capsule (100 mg total) by mouth every 8 (eight) hours.  [name] Kitchen glucose blood (ONETOUCH VERIO) test strip Check blood sugar daily  . Lancet Devices (MICROLET NEXT LANCING DEVICE) MISC 1 each by Does not apply route 3 (three) times daily. Check blood sugar 3 times daily  . levothyroxine (SYNTHROID) 150 MCG tablet Take 1 tablet (150 mcg total) by mouth daily.  [name] DELICA LANCETS 69P MISC Check blood sugar once daily  . PROAIR HFA 108 (90 Base) MCG/ACT inhaler INHALE 2 PUFFS INTO THE LUNGS EVERY 6 HOURS AS NEEDED FOR WHEEZING OR SHORTNESS OF BREATH  . rosuvastatin (CRESTOR) 20 MG tablet Take 1 tablet (20 mg total) by mouth daily.   No facility-administered encounter medications on  file as of 01/21/2020.    [name], [place] / [place]   [phone] 11:58 AM

## 2020-01-23 NOTE — Patient Instructions (Signed)
Visit Information  Goals Addressed            This Visit's Progress   . " I am confused with having my legs amputatated and trying to cope" (pt-stated)       CARE PLAN ENTRY (see longtitudinal plan of care for additional care plan information)  Current Barriers:  . Chronic Disease Management support and education needs/crisis intervention in patient with bilateral below the knee amputation, Iron deficiency anemia, and community resources/urgent housing crisis  . Knowledge Deficits related to imbalanced nutrition less than body requirements as evidenced by Moderate protein-calorie malnutrition  Nurse Case Manager Clinical Goal(s):  Marland Kitchen Over the next 30 days, patient will verbalize understanding of plan  Interventions:  . Evaluation of current treatment plan related patient's adherence to plan as established by provider. . Provided education to patient re: nutrition and wound healing . Collaborated with SW regarding transportation . Discussed plans with patient for ongoing care management follow up and provided patient with direct contact information for care management team . Patient stated that [facility] came to her home and she did not have hot water or heat.  They called APS and she had 30 days to fix the problem.The patient states that she has lived in the home and has not had hot water or heat for over 2 years and done well.  She has spoken to people to fix the problem and it will cost 6k.  She has her grandsons that are staying with her.  They provide food for her.  They are mainly eating out.  She states that she does have canned food in the home but nothing in the frig.  She states that she is confused with all of the people she has spoken with and can't remember names.  Social Worker [name] was on the phone by speaker in the room talking with the patient . Patient was given depends for incontinence,  a box of food and a scat applications for transportation. I will f/u with the patient  on 11/15/19  . 12/2119 . Patient states that home health has started in the home nurse is coming and helps her with her meds . Patient states that PT  is coming and having to stretches to strengthen her arms  . OT will be coming on the next session. . Patient states that someone called in regards to a ramp and left a message.  Advised the patient to make sure that she returns the call and give them the information that they need to help her to get her ramp built.  She verbalized understanding.    . Patient Self Care Activities:  . Patient verbalizes understanding of plan  . Attends all scheduled provider appointments . Calls provider office for new concerns or questions . Unable to independently self manage community resources  Please see past updates related to this goal by clicking on the "Past Updates" button in the selected goal         [name] was given information about Care Management services today including:  1. Care Management services include personalized support from designated clinical staff supervised by her physician, including individualized plan of care and coordination with other care providers 2. 24/7 contact phone numbers for assistance for urgent and routine care needs. 3. The patient may stop CCM services at any time (effective at the end of the month) by phone call to the office staff.  Patient agreed to services and verbal consent obtained.  The patient verbalized understanding of instructions provided today and declined a print copy of patient instruction materials.   RNCM will follow up with the patient within the next month of June  Kari Jian RN, BSN, [facility] Care Management Coordinator [place] Phone: [phone] Fax: [phone]

## 2020-01-23 NOTE — Chronic Care Management (AMB) (Signed)
Care Management   Follow Up Note   01/23/2020 Name: [name] MRN: [id number] DOB: 01-20-1946  Referred by: [name], MD Reason for referral : Care Coordination (Care Management RNCM DMII/HTN )   [name] is a 74 y.o. year old female who is a primary care patient of Kari Mems Delorse Limber, MD. The care management team was consulted for assistance with care management and care coordination needs.    Review of patient status, including review of consultants reports, relevant laboratory and other test results, and collaboration with appropriate care team members and the patient's provider was performed as part of comprehensive patient evaluation and provision of chronic care management services.    SDOH (Social Determinants of Health) assessments performed: No See Care Plan activities for detailed interventions related to [facility])     Advanced Directives: See Care Plan and Vynca application for related entries.   Goals Addressed            This Visit's Progress   . " I am confused with having my legs amputatated and trying to cope" (pt-stated)       CARE PLAN ENTRY (see longtitudinal plan of care for additional care plan information)  Current Barriers:  . Chronic Disease Management support and education needs/crisis intervention in patient with bilateral below the knee amputation, Iron deficiency anemia, and community resources/urgent housing crisis  . Knowledge Deficits related to imbalanced nutrition less than body requirements as evidenced by Moderate protein-calorie malnutrition  Nurse Case Manager Clinical Goal(s):  Marland Kitchen Over the next 30 days, patient will verbalize understanding of plan  Interventions:  . Evaluation of current treatment plan related patient's adherence to plan as established by provider. . Provided education to patient re: nutrition and wound healing . Collaborated with SW regarding transportation . Discussed plans with patient for ongoing care  management follow up and provided patient with direct contact information for care management team . Patient stated that [facility] came to her home and she did not have hot water or heat.  They called APS and she had 30 days to fix the problem.The patient states that she has lived in the home and has not had hot water or heat for over 2 years and done well.  She has spoken to people to fix the problem and it will cost 6k.  She has her grandsons that are staying with her.  They provide food for her.  They are mainly eating out.  She states that she does have canned food in the home but nothing in the frig.  She states that she is confused with all of the people she has spoken with and can't remember names.  Social Worker [name] was on the phone by speaker in the room talking with the patient . Patient was given depends for incontinence,  a box of food and a scat applications for transportation. I will f/u with the patient on 11/15/19  . 12/2119 . Patient states that home health has started in the home nurse is coming and helps her with her meds . Patient states that PT  is coming and having to stretches to strengthen her arms  . OT will be coming on the next session. . Patient states that someone called in regards to a ramp and left a message.  Advised the patient to make sure that she returns the call and give them the information that they need to help her to get her ramp built.  She verbalized understanding.    Marland Kitchen  Patient Self Care Activities:  . Patient verbalizes understanding of plan  . Attends all scheduled provider appointments . Calls provider office for new concerns or questions . Unable to independently self manage community resources  Please see past updates related to this goal by clicking on the "Past Updates" button in the selected goal          RNCM will follow up with the patient within the next month of June   Tangia Pinard RN, BSN, [place] Phone: [phone] Fax: [phone]

## 2020-01-24 DIAGNOSIS — Z89611 Acquired absence of right leg above knee: Secondary | ICD-10-CM | POA: Diagnosis not present

## 2020-01-24 DIAGNOSIS — M069 Rheumatoid arthritis, unspecified: Secondary | ICD-10-CM | POA: Diagnosis not present

## 2020-01-24 DIAGNOSIS — E1142 Type 2 diabetes mellitus with diabetic polyneuropathy: Secondary | ICD-10-CM | POA: Diagnosis not present

## 2020-01-24 DIAGNOSIS — Z602 Problems related to living alone: Secondary | ICD-10-CM | POA: Diagnosis not present

## 2020-01-24 DIAGNOSIS — Z8631 Personal history of diabetic foot ulcer: Secondary | ICD-10-CM | POA: Diagnosis not present

## 2020-01-24 DIAGNOSIS — L89151 Pressure ulcer of sacral region, stage 1: Secondary | ICD-10-CM | POA: Diagnosis not present

## 2020-01-24 DIAGNOSIS — M8580 Other specified disorders of bone density and structure, unspecified site: Secondary | ICD-10-CM | POA: Diagnosis not present

## 2020-01-24 DIAGNOSIS — I130 Hypertensive heart and chronic kidney disease with heart failure and stage 1 through stage 4 chronic kidney disease, or unspecified chronic kidney disease: Secondary | ICD-10-CM | POA: Diagnosis not present

## 2020-01-24 DIAGNOSIS — E1151 Type 2 diabetes mellitus with diabetic peripheral angiopathy without gangrene: Secondary | ICD-10-CM | POA: Diagnosis not present

## 2020-01-24 DIAGNOSIS — E1122 Type 2 diabetes mellitus with diabetic chronic kidney disease: Secondary | ICD-10-CM | POA: Diagnosis not present

## 2020-01-24 DIAGNOSIS — Z89612 Acquired absence of left leg above knee: Secondary | ICD-10-CM | POA: Diagnosis not present

## 2020-01-24 DIAGNOSIS — Z993 Dependence on wheelchair: Secondary | ICD-10-CM | POA: Diagnosis not present

## 2020-01-24 DIAGNOSIS — Z591 Inadequate housing: Secondary | ICD-10-CM | POA: Diagnosis not present

## 2020-01-24 DIAGNOSIS — Z4801 Encounter for change or removal of surgical wound dressing: Secondary | ICD-10-CM | POA: Diagnosis not present

## 2020-01-24 DIAGNOSIS — I509 Heart failure, unspecified: Secondary | ICD-10-CM | POA: Diagnosis not present

## 2020-01-24 DIAGNOSIS — E440 Moderate protein-calorie malnutrition: Secondary | ICD-10-CM | POA: Diagnosis not present

## 2020-01-24 DIAGNOSIS — Z7951 Long term (current) use of inhaled steroids: Secondary | ICD-10-CM | POA: Diagnosis not present

## 2020-01-24 DIAGNOSIS — N189 Chronic kidney disease, unspecified: Secondary | ICD-10-CM | POA: Diagnosis not present

## 2020-01-24 DIAGNOSIS — E039 Hypothyroidism, unspecified: Secondary | ICD-10-CM | POA: Diagnosis not present

## 2020-01-24 DIAGNOSIS — Z8739 Personal history of other diseases of the musculoskeletal system and connective tissue: Secondary | ICD-10-CM | POA: Diagnosis not present

## 2020-01-24 DIAGNOSIS — Z4781 Encounter for orthopedic aftercare following surgical amputation: Secondary | ICD-10-CM | POA: Diagnosis not present

## 2020-01-24 DIAGNOSIS — G546 Phantom limb syndrome with pain: Secondary | ICD-10-CM | POA: Diagnosis not present

## 2020-01-25 ENCOUNTER — Telehealth: Payer: Self-pay

## 2020-01-25 ENCOUNTER — Ambulatory Visit (INDEPENDENT_AMBULATORY_CARE_PROVIDER_SITE_OTHER): Payer: Medicare Other | Admitting: Family Medicine

## 2020-01-25 ENCOUNTER — Other Ambulatory Visit: Payer: Self-pay

## 2020-01-25 ENCOUNTER — Encounter: Payer: Medicare Other | Admitting: Internal Medicine

## 2020-01-25 ENCOUNTER — Encounter: Payer: Self-pay | Admitting: Family Medicine

## 2020-01-25 VITALS — BP 162/80 | HR 84 | Wt 87.4 lb

## 2020-01-25 DIAGNOSIS — Z89612 Acquired absence of left leg above knee: Secondary | ICD-10-CM

## 2020-01-25 DIAGNOSIS — Z7951 Long term (current) use of inhaled steroids: Secondary | ICD-10-CM | POA: Diagnosis not present

## 2020-01-25 DIAGNOSIS — L89151 Pressure ulcer of sacral region, stage 1: Secondary | ICD-10-CM | POA: Diagnosis not present

## 2020-01-25 DIAGNOSIS — Z591 Inadequate housing: Secondary | ICD-10-CM | POA: Diagnosis not present

## 2020-01-25 DIAGNOSIS — M8580 Other specified disorders of bone density and structure, unspecified site: Secondary | ICD-10-CM | POA: Diagnosis not present

## 2020-01-25 DIAGNOSIS — R053 Chronic cough: Secondary | ICD-10-CM

## 2020-01-25 DIAGNOSIS — E039 Hypothyroidism, unspecified: Secondary | ICD-10-CM | POA: Diagnosis not present

## 2020-01-25 DIAGNOSIS — I509 Heart failure, unspecified: Secondary | ICD-10-CM | POA: Diagnosis not present

## 2020-01-25 DIAGNOSIS — Z8631 Personal history of diabetic foot ulcer: Secondary | ICD-10-CM | POA: Diagnosis not present

## 2020-01-25 DIAGNOSIS — R05 Cough: Secondary | ICD-10-CM

## 2020-01-25 DIAGNOSIS — G546 Phantom limb syndrome with pain: Secondary | ICD-10-CM | POA: Diagnosis not present

## 2020-01-25 DIAGNOSIS — Z89611 Acquired absence of right leg above knee: Secondary | ICD-10-CM | POA: Diagnosis not present

## 2020-01-25 DIAGNOSIS — E440 Moderate protein-calorie malnutrition: Secondary | ICD-10-CM | POA: Diagnosis not present

## 2020-01-25 DIAGNOSIS — E1122 Type 2 diabetes mellitus with diabetic chronic kidney disease: Secondary | ICD-10-CM | POA: Diagnosis not present

## 2020-01-25 DIAGNOSIS — N189 Chronic kidney disease, unspecified: Secondary | ICD-10-CM | POA: Diagnosis not present

## 2020-01-25 DIAGNOSIS — Z4801 Encounter for change or removal of surgical wound dressing: Secondary | ICD-10-CM | POA: Diagnosis not present

## 2020-01-25 DIAGNOSIS — Z8739 Personal history of other diseases of the musculoskeletal system and connective tissue: Secondary | ICD-10-CM | POA: Diagnosis not present

## 2020-01-25 DIAGNOSIS — I130 Hypertensive heart and chronic kidney disease with heart failure and stage 1 through stage 4 chronic kidney disease, or unspecified chronic kidney disease: Secondary | ICD-10-CM | POA: Diagnosis not present

## 2020-01-25 DIAGNOSIS — E1142 Type 2 diabetes mellitus with diabetic polyneuropathy: Secondary | ICD-10-CM | POA: Diagnosis not present

## 2020-01-25 DIAGNOSIS — Z993 Dependence on wheelchair: Secondary | ICD-10-CM | POA: Diagnosis not present

## 2020-01-25 DIAGNOSIS — I10 Essential (primary) hypertension: Secondary | ICD-10-CM

## 2020-01-25 DIAGNOSIS — Z602 Problems related to living alone: Secondary | ICD-10-CM | POA: Diagnosis not present

## 2020-01-25 DIAGNOSIS — Z4781 Encounter for orthopedic aftercare following surgical amputation: Secondary | ICD-10-CM | POA: Diagnosis not present

## 2020-01-25 DIAGNOSIS — M069 Rheumatoid arthritis, unspecified: Secondary | ICD-10-CM | POA: Diagnosis not present

## 2020-01-25 DIAGNOSIS — E1151 Type 2 diabetes mellitus with diabetic peripheral angiopathy without gangrene: Secondary | ICD-10-CM | POA: Diagnosis not present

## 2020-01-25 MED ORDER — LIDOCAINE 5 % EX PTCH: MEDICATED_PATCH | CUTANEOUS | 0 refills | Status: DC

## 2020-01-25 NOTE — Assessment & Plan Note (Signed)
Presenting with phantom pain this morning. As discussed with her, she has been taking less than the recommended dose for her pain. She is advised to take Gabapentin 100 mg TID instead of daily and Tylenol as needed for pain. She will f/u in 1-2 weeks with her PCP for reassessment and dose adjustment. She and her grandson verbalized understanding and agreed with the plan.

## 2020-01-25 NOTE — Progress Notes (Addendum)
SUBJECTIVE:   CHIEF COMPLAINT / HPI:   Phantom pain:C/O B/L LL pain since she had B/L AKA. She describes the pain as a burning sensation over her amputated LL. She has been doing well without severe pain until last night. She suspects this was triggered by her drinking coca-cola before bedtime yesterday. She takes Gabapentin 100 mg qd.  Crackles: Patient here to f/u for her lung crackles as instructed by her visiting RN. She endorses chronic cough on and off since her most recent hospitalization in February. No sputum production. No SOB. No Chest. No fever. She feels well and at her baseline otherwise.  HTN:She did not take her Lasix this morning since she is yet to have her breakfast.  PERTINENT  PMH / PSH: PMX reviewed.  OBJECTIVE:   Vitals:   01/25/20 0842 01/25/20 0901  BP: (!) 172/84 (!) 162/80  Pulse: 84   SpO2: 98%   Weight:  87 lb 6.4 oz (39.6 kg)    Physical Exam Vitals and nursing note reviewed.  Cardiovascular:     Rate and Rhythm: Normal rate and regular rhythm.     Heart sounds: Normal heart sounds. No murmur.  Pulmonary:     Effort: Pulmonary effort is normal.     Breath sounds: Normal breath sounds. No wheezing.     Comments: + coarse crackles B/L in all lobes Abdominal:     General: Abdomen is flat. Bowel sounds are normal.     Palpations: Abdomen is soft.     Tenderness: There is no abdominal tenderness.  Musculoskeletal:     Comments: B/L AKA      ASSESSMENT/PLAN:   Chronic cough/Crackles: The patient did not cough throughout her exam today. Lung crackle is chronic and documented on file as far back as 2019. She had a recent chest x-ray during her hospitalization in February, showing infiltrate and lung fibrosis. She was adequately treated in the hospital then with A/B for sepsis. ?? Interstitial lung disease given fibrosis on chest Xray. This could present with crackles on the exam as well. Documentation of Diastolic CHF on file, although  not listed on her problem list. This might also contribute to her lung crackles. She is compliant with Lasix for ?? Diastolic CHF. She is euvolemic and weighs 87 lbs which decreased from her weight of 108 in February pre-AKA. Repeat chest x-ray given hx of cough for the past few months to r/o PNA. I will contact her with her chest x-ray report. F/U with PCP to re-evaluate and treat for CHF/ILD (again, she is stable from that standpoint). Return precaution discussed.    S/P AKA (above knee amputation) bilateral ([place]) Presenting with phantom pain this morning. I reviewed her record (Hospitalization for AKA and med list) As discussed with her, she has been taking less than the recommended dose for her pain. She is advised to take Gabapentin 100 mg TID instead of daily and Tylenol as needed for pain. She will f/u in 1-2 weeks with her PCP for reassessment and dose adjustment. She and her grandson verbalized understanding and agreed with the plan.  Addendum:Call from [place] requesting something for phantom pain. I checked in with her PCP and she did not recommend Opioid. May continue Gabapentin as instructed + Tylenol as needed. Trial of Lidoderm patch, although unclear how effective this is regarding phantom pain. Lidoderm patch escribed.   HYPERTENSION, BENIGN SYSTEMIC BP elevated today. Likely related to her pain and the fact that she is yet to  take her Lasix dose this morning. Repeat BP checked by me improved just a bit. She is advised to take her meds as soon as she gets home.     Andrena Mews, MD [place]

## 2020-01-25 NOTE — Telephone Encounter (Signed)
Patient called and informed of below.   Talbot Grumbling, RN

## 2020-01-25 NOTE — Assessment & Plan Note (Signed)
BP elevated today. Likely related to her pain and the fact that she is yet to take her Lasix dose this morning. Repeat BP checked by me improved just a bit. She is advised to take her meds as soon as she gets home.

## 2020-01-25 NOTE — Patient Instructions (Signed)

## 2020-01-25 NOTE — Telephone Encounter (Signed)
I did check with her PCP and she is not in support of starting her on opioid for this. Please advise taking her Gabapentin as instructed with Tylenol/Ibuprofen as needed.  I will send in Lidoderm patch to see if this will help in addition to the above regimen.  F/U with PCP as scheduled.

## 2020-01-25 NOTE — Telephone Encounter (Signed)
Shanon Brow, PT with [facility], calls to report severe pain and neuropathy associated with phantom limb pain. Patient is rating pain at 10/10. Patient was seen in clinic this AM by Dr. Gwendlyn Deutscher and was told she could increase her gabapentin to three times daily. Patient's last dose of gabapentin and tylenol was around 0930.   Called and spoke with patient. Advised patient that I would contact PCP and Dr. Gwendlyn Deutscher and see if there was anything additional we should be doing. Patient verbalized understanding.   ED precautions given  Talbot Grumbling, RN

## 2020-01-25 NOTE — Addendum Note (Signed)
Addended by: Andrena Mews T on: 01/25/2020 01:56 PM   Modules accepted: Orders

## 2020-01-26 ENCOUNTER — Ambulatory Visit: Payer: Medicare Other

## 2020-01-26 DIAGNOSIS — E1122 Type 2 diabetes mellitus with diabetic chronic kidney disease: Secondary | ICD-10-CM | POA: Diagnosis not present

## 2020-01-26 DIAGNOSIS — I509 Heart failure, unspecified: Secondary | ICD-10-CM | POA: Diagnosis not present

## 2020-01-26 DIAGNOSIS — Z4801 Encounter for change or removal of surgical wound dressing: Secondary | ICD-10-CM | POA: Diagnosis not present

## 2020-01-26 DIAGNOSIS — Z89611 Acquired absence of right leg above knee: Secondary | ICD-10-CM | POA: Diagnosis not present

## 2020-01-26 DIAGNOSIS — Z89612 Acquired absence of left leg above knee: Secondary | ICD-10-CM | POA: Diagnosis not present

## 2020-01-26 DIAGNOSIS — G546 Phantom limb syndrome with pain: Secondary | ICD-10-CM | POA: Diagnosis not present

## 2020-01-26 DIAGNOSIS — M069 Rheumatoid arthritis, unspecified: Secondary | ICD-10-CM | POA: Diagnosis not present

## 2020-01-26 DIAGNOSIS — Z602 Problems related to living alone: Secondary | ICD-10-CM | POA: Diagnosis not present

## 2020-01-26 DIAGNOSIS — Z4781 Encounter for orthopedic aftercare following surgical amputation: Secondary | ICD-10-CM | POA: Diagnosis not present

## 2020-01-26 DIAGNOSIS — Z993 Dependence on wheelchair: Secondary | ICD-10-CM | POA: Diagnosis not present

## 2020-01-26 DIAGNOSIS — L89151 Pressure ulcer of sacral region, stage 1: Secondary | ICD-10-CM | POA: Diagnosis not present

## 2020-01-26 DIAGNOSIS — Z7951 Long term (current) use of inhaled steroids: Secondary | ICD-10-CM | POA: Diagnosis not present

## 2020-01-26 DIAGNOSIS — I130 Hypertensive heart and chronic kidney disease with heart failure and stage 1 through stage 4 chronic kidney disease, or unspecified chronic kidney disease: Secondary | ICD-10-CM | POA: Diagnosis not present

## 2020-01-26 DIAGNOSIS — N189 Chronic kidney disease, unspecified: Secondary | ICD-10-CM | POA: Diagnosis not present

## 2020-01-26 DIAGNOSIS — E1151 Type 2 diabetes mellitus with diabetic peripheral angiopathy without gangrene: Secondary | ICD-10-CM | POA: Diagnosis not present

## 2020-01-26 DIAGNOSIS — E440 Moderate protein-calorie malnutrition: Secondary | ICD-10-CM | POA: Diagnosis not present

## 2020-01-26 DIAGNOSIS — E039 Hypothyroidism, unspecified: Secondary | ICD-10-CM | POA: Diagnosis not present

## 2020-01-26 DIAGNOSIS — Z8739 Personal history of other diseases of the musculoskeletal system and connective tissue: Secondary | ICD-10-CM | POA: Diagnosis not present

## 2020-01-26 DIAGNOSIS — Z591 Inadequate housing: Secondary | ICD-10-CM | POA: Diagnosis not present

## 2020-01-26 DIAGNOSIS — E1142 Type 2 diabetes mellitus with diabetic polyneuropathy: Secondary | ICD-10-CM | POA: Diagnosis not present

## 2020-01-26 DIAGNOSIS — Z8631 Personal history of diabetic foot ulcer: Secondary | ICD-10-CM | POA: Diagnosis not present

## 2020-01-26 DIAGNOSIS — M8580 Other specified disorders of bone density and structure, unspecified site: Secondary | ICD-10-CM | POA: Diagnosis not present

## 2020-01-26 NOTE — Patient Instructions (Signed)
Visit Information  Goals Addressed            This Visit's Progress   . " I am confused with having my legs amputatated and trying to cope" (pt-stated)       CARE PLAN ENTRY (see longtitudinal plan of care for additional care plan information)  Current Barriers:  . Chronic Disease Management support and education needs/crisis intervention in patient with bilateral below the knee amputation, Iron deficiency anemia, and community resources/urgent housing crisis  . Knowledge Deficits related to imbalanced nutrition less than body requirements as evidenced by Moderate protein-calorie malnutrition  Nurse Case Manager Clinical Goal(s):  Marland Kitchen Over the next 30 days, patient will verbalize understanding of plan  Interventions:  . Evaluation of current treatment plan related patient's adherence to plan as established by provider. . Provided education to patient re: nutrition and wound healing . Collaborated with SW regarding transportation . Discussed plans with patient for ongoing care management follow up and provided patient with direct contact information for care management team . Patient stated that [facility] came to her home and she did not have hot water or heat.  They called APS and she had 30 days to fix the problem.The patient states that she has lived in the home and has not had hot water or heat for over 2 years and done well.  She has spoken to people to fix the problem and it will cost 6k.  She has her grandsons that are staying with her.  They provide food for her.  They are mainly eating out.  She states that she does have canned food in the home but nothing in the frig.  She states that she is confused with all of the people she has spoken with and can't remember names.  Social Worker [name] was on the phone by speaker in the room talking with the patient . Patient was given depends for incontinence,  a box of food and a scat applications for transportation. I will f/u with the patient  on 11/15/19  . 01/26/20 . Patient states that she was seen at the doctors yesterday and she was having phatom pain in her leg.  She was given medication but has not picked it up yet at at the pharmacy. . She rates the pain at a 7/10  . She states that the OT came today and they checked her BP and stated that it was low but she does not remember the number . PT OT and nursing come to the home 2 times per week . She is still receiving the meals and enjoying them.  Her appetite is good. . She states that she has a message on her phone that some gentleman called her about the ramp for her phone.  I explained that she needs to call him back because we have no way of knowing who left her the message .  She can call us and give information if we need to help.   . Patient Self Care Activities:  . Patient verbalizes understanding of plan  . Attends all scheduled provider appointments . Calls provider office for new concerns or questions . Unable to independently self manage community resources  Please see past updates related to this goal by clicking on the "Past Updates" button in the selected goal         Ms. Rosasco was given information about Care Management services today including:  1. Care Management services include personalized support from designated clinical staff  supervised by her physician, including individualized plan of care and coordination with other care providers 2. 24/7 contact phone numbers for assistance for urgent and routine care needs. 3. The patient may stop CCM services at any time (effective at the end of the month) by phone call to the office staff.  Patient agreed to services and verbal consent obtained.   The patient verbalized understanding of instructions provided today and declined a print copy of patient instruction materials.   The care management team will reach out to the patient again over the next 14 days.  The patient has been provided with contact  information for the care management team and has been advised to call with any health related questions or concerns.   [name] RN, BSN, [facility] Care Management Coordinator [place] Phone: [phone] Fax: [phone]

## 2020-01-26 NOTE — Chronic Care Management (AMB) (Signed)
Care Management   Follow Up Note   01/26/2020 Name: [name] MRN: [id number] DOB: Aug 08, 1946  Referred by: [name], MD Reason for referral : Care Coordination (Care Management RNCM HTN )   [name] is a 74 y.o. year old female who is a primary care patient of [name], MD. The care management team was consulted for assistance with care management and care coordination needs.    Review of patient status, including review of consultants reports, relevant laboratory and other test results, and collaboration with appropriate care team members and the patient's provider was performed as part of comprehensive patient evaluation and provision of chronic care management services.    SDOH (Social Determinants of Health) assessments performed: No See Care Plan activities for detailed interventions related to [facility])     Advanced Directives: See Care Plan and Vynca application for related entries.   Goals Addressed            This Visit's Progress   . " I am confused with having my legs amputatated and trying to cope" (pt-stated)       CARE PLAN ENTRY (see longtitudinal plan of care for additional care plan information)  Current Barriers:  . Chronic Disease Management support and education needs/crisis intervention in patient with bilateral below the knee amputation, Iron deficiency anemia, and community resources/urgent housing crisis  . Knowledge Deficits related to imbalanced nutrition less than body requirements as evidenced by Moderate protein-calorie malnutrition  Nurse Case Manager Clinical Goal(s):  Marland Kitchen Over the next 30 days, patient will verbalize understanding of plan  Interventions:  . Evaluation of current treatment plan related patient's adherence to plan as established by provider. . Provided education to patient re: nutrition and wound healing . Collaborated with SW regarding transportation . Discussed plans with patient for ongoing care  management follow up and provided patient with direct contact information for care management team . Patient stated that [facility] came to her home and she did not have hot water or heat.  They called APS and she had 30 days to fix the problem.The patient states that she has lived in the home and has not had hot water or heat for over 2 years and done well.  She has spoken to people to fix the problem and it will cost 6k.  She has her grandsons that are staying with her.  They provide food for her.  They are mainly eating out.  She states that she does have canned food in the home but nothing in the frig.  She states that she is confused with all of the people she has spoken with and can't remember names.  Social Worker [name] was on the phone by speaker in the room talking with the patient . Patient was given depends for incontinence,  a box of food and a scat applications for transportation. I will f/u with the patient on 11/15/19  . 01/26/20 . Patient states that she was seen at the doctors yesterday and she was having phatom pain in her leg.  She was given medication but has not picked it up yet at at the pharmacy. . She rates the pain at a 7/10  . She states that the OT came today and they checked her BP and stated that it was low but she does not remember the number . PT OT and nursing come to the home 2 times per week . She is still receiving the meals and enjoying  them.  Her appetite is good. . She states that she has a message on her phone that some gentleman called her about the ramp for her phone.  I explained that she needs to call him back because we have no way of knowing who left her the message .  She can call us and give information if we need to help.   . Patient Self Care Activities:  . Patient verbalizes understanding of plan  . Attends all scheduled provider appointments . Calls provider office for new concerns or questions . Unable to independently self manage community  resources  Please see past updates related to this goal by clicking on the "Past Updates" button in the selected goal          The care management team will reach out to the patient again over the next 14 days.  The patient has been provided with contact information for the care management team and has been advised to call with any health related questions or concerns.   [name] RN, BSN, [facility] Care Management Coordinator [place] Phone: [phone] Fax: [phone]

## 2020-01-28 DIAGNOSIS — Z993 Dependence on wheelchair: Secondary | ICD-10-CM | POA: Diagnosis not present

## 2020-01-28 DIAGNOSIS — I509 Heart failure, unspecified: Secondary | ICD-10-CM | POA: Diagnosis not present

## 2020-01-28 DIAGNOSIS — E440 Moderate protein-calorie malnutrition: Secondary | ICD-10-CM | POA: Diagnosis not present

## 2020-01-28 DIAGNOSIS — Z7951 Long term (current) use of inhaled steroids: Secondary | ICD-10-CM | POA: Diagnosis not present

## 2020-01-28 DIAGNOSIS — Z89612 Acquired absence of left leg above knee: Secondary | ICD-10-CM | POA: Diagnosis not present

## 2020-01-28 DIAGNOSIS — I130 Hypertensive heart and chronic kidney disease with heart failure and stage 1 through stage 4 chronic kidney disease, or unspecified chronic kidney disease: Secondary | ICD-10-CM | POA: Diagnosis not present

## 2020-01-28 DIAGNOSIS — L89151 Pressure ulcer of sacral region, stage 1: Secondary | ICD-10-CM | POA: Diagnosis not present

## 2020-01-28 DIAGNOSIS — N189 Chronic kidney disease, unspecified: Secondary | ICD-10-CM | POA: Diagnosis not present

## 2020-01-28 DIAGNOSIS — Z591 Inadequate housing: Secondary | ICD-10-CM | POA: Diagnosis not present

## 2020-01-28 DIAGNOSIS — Z4781 Encounter for orthopedic aftercare following surgical amputation: Secondary | ICD-10-CM | POA: Diagnosis not present

## 2020-01-28 DIAGNOSIS — E039 Hypothyroidism, unspecified: Secondary | ICD-10-CM | POA: Diagnosis not present

## 2020-01-28 DIAGNOSIS — M8580 Other specified disorders of bone density and structure, unspecified site: Secondary | ICD-10-CM | POA: Diagnosis not present

## 2020-01-28 DIAGNOSIS — Z602 Problems related to living alone: Secondary | ICD-10-CM | POA: Diagnosis not present

## 2020-01-28 DIAGNOSIS — E1142 Type 2 diabetes mellitus with diabetic polyneuropathy: Secondary | ICD-10-CM | POA: Diagnosis not present

## 2020-01-28 DIAGNOSIS — Z8631 Personal history of diabetic foot ulcer: Secondary | ICD-10-CM | POA: Diagnosis not present

## 2020-01-28 DIAGNOSIS — E1151 Type 2 diabetes mellitus with diabetic peripheral angiopathy without gangrene: Secondary | ICD-10-CM | POA: Diagnosis not present

## 2020-01-28 DIAGNOSIS — Z4801 Encounter for change or removal of surgical wound dressing: Secondary | ICD-10-CM | POA: Diagnosis not present

## 2020-01-28 DIAGNOSIS — Z89611 Acquired absence of right leg above knee: Secondary | ICD-10-CM | POA: Diagnosis not present

## 2020-01-28 DIAGNOSIS — Z8739 Personal history of other diseases of the musculoskeletal system and connective tissue: Secondary | ICD-10-CM | POA: Diagnosis not present

## 2020-01-28 DIAGNOSIS — M069 Rheumatoid arthritis, unspecified: Secondary | ICD-10-CM | POA: Diagnosis not present

## 2020-01-28 DIAGNOSIS — E1122 Type 2 diabetes mellitus with diabetic chronic kidney disease: Secondary | ICD-10-CM | POA: Diagnosis not present

## 2020-01-28 DIAGNOSIS — G546 Phantom limb syndrome with pain: Secondary | ICD-10-CM | POA: Diagnosis not present

## 2020-01-31 ENCOUNTER — Ambulatory Visit: Payer: Medicare Other | Admitting: Physical Therapy

## 2020-01-31 ENCOUNTER — Encounter: Payer: Medicare Other | Admitting: Physical Therapy

## 2020-01-31 DIAGNOSIS — Z602 Problems related to living alone: Secondary | ICD-10-CM | POA: Diagnosis not present

## 2020-01-31 DIAGNOSIS — Z591 Inadequate housing: Secondary | ICD-10-CM | POA: Diagnosis not present

## 2020-01-31 DIAGNOSIS — E039 Hypothyroidism, unspecified: Secondary | ICD-10-CM | POA: Diagnosis not present

## 2020-01-31 DIAGNOSIS — Z8631 Personal history of diabetic foot ulcer: Secondary | ICD-10-CM | POA: Diagnosis not present

## 2020-01-31 DIAGNOSIS — E440 Moderate protein-calorie malnutrition: Secondary | ICD-10-CM | POA: Diagnosis not present

## 2020-01-31 DIAGNOSIS — Z8739 Personal history of other diseases of the musculoskeletal system and connective tissue: Secondary | ICD-10-CM | POA: Diagnosis not present

## 2020-01-31 DIAGNOSIS — I509 Heart failure, unspecified: Secondary | ICD-10-CM | POA: Diagnosis not present

## 2020-01-31 DIAGNOSIS — Z89611 Acquired absence of right leg above knee: Secondary | ICD-10-CM | POA: Diagnosis not present

## 2020-01-31 DIAGNOSIS — E1122 Type 2 diabetes mellitus with diabetic chronic kidney disease: Secondary | ICD-10-CM | POA: Diagnosis not present

## 2020-01-31 DIAGNOSIS — E1142 Type 2 diabetes mellitus with diabetic polyneuropathy: Secondary | ICD-10-CM | POA: Diagnosis not present

## 2020-01-31 DIAGNOSIS — I130 Hypertensive heart and chronic kidney disease with heart failure and stage 1 through stage 4 chronic kidney disease, or unspecified chronic kidney disease: Secondary | ICD-10-CM | POA: Diagnosis not present

## 2020-01-31 DIAGNOSIS — G546 Phantom limb syndrome with pain: Secondary | ICD-10-CM | POA: Diagnosis not present

## 2020-01-31 DIAGNOSIS — Z7951 Long term (current) use of inhaled steroids: Secondary | ICD-10-CM | POA: Diagnosis not present

## 2020-01-31 DIAGNOSIS — L89151 Pressure ulcer of sacral region, stage 1: Secondary | ICD-10-CM | POA: Diagnosis not present

## 2020-01-31 DIAGNOSIS — Z4801 Encounter for change or removal of surgical wound dressing: Secondary | ICD-10-CM | POA: Diagnosis not present

## 2020-01-31 DIAGNOSIS — N189 Chronic kidney disease, unspecified: Secondary | ICD-10-CM | POA: Diagnosis not present

## 2020-01-31 DIAGNOSIS — Z993 Dependence on wheelchair: Secondary | ICD-10-CM | POA: Diagnosis not present

## 2020-01-31 DIAGNOSIS — M8580 Other specified disorders of bone density and structure, unspecified site: Secondary | ICD-10-CM | POA: Diagnosis not present

## 2020-01-31 DIAGNOSIS — M069 Rheumatoid arthritis, unspecified: Secondary | ICD-10-CM | POA: Diagnosis not present

## 2020-01-31 DIAGNOSIS — Z89612 Acquired absence of left leg above knee: Secondary | ICD-10-CM | POA: Diagnosis not present

## 2020-01-31 DIAGNOSIS — Z4781 Encounter for orthopedic aftercare following surgical amputation: Secondary | ICD-10-CM | POA: Diagnosis not present

## 2020-01-31 DIAGNOSIS — E1151 Type 2 diabetes mellitus with diabetic peripheral angiopathy without gangrene: Secondary | ICD-10-CM | POA: Diagnosis not present

## 2020-02-01 ENCOUNTER — Encounter (INDEPENDENT_AMBULATORY_CARE_PROVIDER_SITE_OTHER): Payer: Medicare Other | Admitting: Ophthalmology

## 2020-02-01 DIAGNOSIS — M6281 Muscle weakness (generalized): Secondary | ICD-10-CM | POA: Diagnosis not present

## 2020-02-01 DIAGNOSIS — S78112D Complete traumatic amputation at level between left hip and knee, subsequent encounter: Secondary | ICD-10-CM | POA: Diagnosis not present

## 2020-02-01 DIAGNOSIS — S78111D Complete traumatic amputation at level between right hip and knee, subsequent encounter: Secondary | ICD-10-CM | POA: Diagnosis not present

## 2020-02-01 DIAGNOSIS — I5032 Chronic diastolic (congestive) heart failure: Secondary | ICD-10-CM | POA: Diagnosis not present

## 2020-02-02 DIAGNOSIS — E1142 Type 2 diabetes mellitus with diabetic polyneuropathy: Secondary | ICD-10-CM | POA: Diagnosis not present

## 2020-02-02 DIAGNOSIS — L89151 Pressure ulcer of sacral region, stage 1: Secondary | ICD-10-CM | POA: Diagnosis not present

## 2020-02-02 DIAGNOSIS — I130 Hypertensive heart and chronic kidney disease with heart failure and stage 1 through stage 4 chronic kidney disease, or unspecified chronic kidney disease: Secondary | ICD-10-CM | POA: Diagnosis not present

## 2020-02-02 DIAGNOSIS — E1151 Type 2 diabetes mellitus with diabetic peripheral angiopathy without gangrene: Secondary | ICD-10-CM | POA: Diagnosis not present

## 2020-02-02 DIAGNOSIS — Z7951 Long term (current) use of inhaled steroids: Secondary | ICD-10-CM | POA: Diagnosis not present

## 2020-02-02 DIAGNOSIS — Z602 Problems related to living alone: Secondary | ICD-10-CM | POA: Diagnosis not present

## 2020-02-02 DIAGNOSIS — M069 Rheumatoid arthritis, unspecified: Secondary | ICD-10-CM | POA: Diagnosis not present

## 2020-02-02 DIAGNOSIS — Z8739 Personal history of other diseases of the musculoskeletal system and connective tissue: Secondary | ICD-10-CM | POA: Diagnosis not present

## 2020-02-02 DIAGNOSIS — Z8631 Personal history of diabetic foot ulcer: Secondary | ICD-10-CM | POA: Diagnosis not present

## 2020-02-02 DIAGNOSIS — Z89612 Acquired absence of left leg above knee: Secondary | ICD-10-CM | POA: Diagnosis not present

## 2020-02-02 DIAGNOSIS — N189 Chronic kidney disease, unspecified: Secondary | ICD-10-CM | POA: Diagnosis not present

## 2020-02-02 DIAGNOSIS — E039 Hypothyroidism, unspecified: Secondary | ICD-10-CM | POA: Diagnosis not present

## 2020-02-02 DIAGNOSIS — Z4801 Encounter for change or removal of surgical wound dressing: Secondary | ICD-10-CM | POA: Diagnosis not present

## 2020-02-02 DIAGNOSIS — G546 Phantom limb syndrome with pain: Secondary | ICD-10-CM | POA: Diagnosis not present

## 2020-02-02 DIAGNOSIS — Z993 Dependence on wheelchair: Secondary | ICD-10-CM | POA: Diagnosis not present

## 2020-02-02 DIAGNOSIS — E1122 Type 2 diabetes mellitus with diabetic chronic kidney disease: Secondary | ICD-10-CM | POA: Diagnosis not present

## 2020-02-02 DIAGNOSIS — E440 Moderate protein-calorie malnutrition: Secondary | ICD-10-CM | POA: Diagnosis not present

## 2020-02-02 DIAGNOSIS — Z591 Inadequate housing: Secondary | ICD-10-CM | POA: Diagnosis not present

## 2020-02-02 DIAGNOSIS — Z4781 Encounter for orthopedic aftercare following surgical amputation: Secondary | ICD-10-CM | POA: Diagnosis not present

## 2020-02-02 DIAGNOSIS — M8580 Other specified disorders of bone density and structure, unspecified site: Secondary | ICD-10-CM | POA: Diagnosis not present

## 2020-02-02 DIAGNOSIS — I509 Heart failure, unspecified: Secondary | ICD-10-CM | POA: Diagnosis not present

## 2020-02-02 DIAGNOSIS — Z89611 Acquired absence of right leg above knee: Secondary | ICD-10-CM | POA: Diagnosis not present

## 2020-02-03 DIAGNOSIS — Z4801 Encounter for change or removal of surgical wound dressing: Secondary | ICD-10-CM | POA: Diagnosis not present

## 2020-02-03 DIAGNOSIS — Z602 Problems related to living alone: Secondary | ICD-10-CM | POA: Diagnosis not present

## 2020-02-03 DIAGNOSIS — N189 Chronic kidney disease, unspecified: Secondary | ICD-10-CM | POA: Diagnosis not present

## 2020-02-03 DIAGNOSIS — L89151 Pressure ulcer of sacral region, stage 1: Secondary | ICD-10-CM | POA: Diagnosis not present

## 2020-02-03 DIAGNOSIS — E1142 Type 2 diabetes mellitus with diabetic polyneuropathy: Secondary | ICD-10-CM | POA: Diagnosis not present

## 2020-02-03 DIAGNOSIS — E1122 Type 2 diabetes mellitus with diabetic chronic kidney disease: Secondary | ICD-10-CM | POA: Diagnosis not present

## 2020-02-03 DIAGNOSIS — Z4781 Encounter for orthopedic aftercare following surgical amputation: Secondary | ICD-10-CM | POA: Diagnosis not present

## 2020-02-03 DIAGNOSIS — Z8631 Personal history of diabetic foot ulcer: Secondary | ICD-10-CM | POA: Diagnosis not present

## 2020-02-03 DIAGNOSIS — E039 Hypothyroidism, unspecified: Secondary | ICD-10-CM | POA: Diagnosis not present

## 2020-02-03 DIAGNOSIS — Z591 Inadequate housing: Secondary | ICD-10-CM | POA: Diagnosis not present

## 2020-02-03 DIAGNOSIS — M069 Rheumatoid arthritis, unspecified: Secondary | ICD-10-CM | POA: Diagnosis not present

## 2020-02-03 DIAGNOSIS — Z993 Dependence on wheelchair: Secondary | ICD-10-CM | POA: Diagnosis not present

## 2020-02-03 DIAGNOSIS — G546 Phantom limb syndrome with pain: Secondary | ICD-10-CM | POA: Diagnosis not present

## 2020-02-03 DIAGNOSIS — E440 Moderate protein-calorie malnutrition: Secondary | ICD-10-CM | POA: Diagnosis not present

## 2020-02-03 DIAGNOSIS — Z8739 Personal history of other diseases of the musculoskeletal system and connective tissue: Secondary | ICD-10-CM | POA: Diagnosis not present

## 2020-02-03 DIAGNOSIS — Z89611 Acquired absence of right leg above knee: Secondary | ICD-10-CM | POA: Diagnosis not present

## 2020-02-03 DIAGNOSIS — I130 Hypertensive heart and chronic kidney disease with heart failure and stage 1 through stage 4 chronic kidney disease, or unspecified chronic kidney disease: Secondary | ICD-10-CM | POA: Diagnosis not present

## 2020-02-03 DIAGNOSIS — E1151 Type 2 diabetes mellitus with diabetic peripheral angiopathy without gangrene: Secondary | ICD-10-CM | POA: Diagnosis not present

## 2020-02-03 DIAGNOSIS — Z7951 Long term (current) use of inhaled steroids: Secondary | ICD-10-CM | POA: Diagnosis not present

## 2020-02-03 DIAGNOSIS — M8580 Other specified disorders of bone density and structure, unspecified site: Secondary | ICD-10-CM | POA: Diagnosis not present

## 2020-02-03 DIAGNOSIS — Z89612 Acquired absence of left leg above knee: Secondary | ICD-10-CM | POA: Diagnosis not present

## 2020-02-03 DIAGNOSIS — I509 Heart failure, unspecified: Secondary | ICD-10-CM | POA: Diagnosis not present

## 2020-02-04 DIAGNOSIS — G546 Phantom limb syndrome with pain: Secondary | ICD-10-CM | POA: Diagnosis not present

## 2020-02-04 DIAGNOSIS — E039 Hypothyroidism, unspecified: Secondary | ICD-10-CM | POA: Diagnosis not present

## 2020-02-04 DIAGNOSIS — Z591 Inadequate housing: Secondary | ICD-10-CM | POA: Diagnosis not present

## 2020-02-04 DIAGNOSIS — I130 Hypertensive heart and chronic kidney disease with heart failure and stage 1 through stage 4 chronic kidney disease, or unspecified chronic kidney disease: Secondary | ICD-10-CM | POA: Diagnosis not present

## 2020-02-04 DIAGNOSIS — E1142 Type 2 diabetes mellitus with diabetic polyneuropathy: Secondary | ICD-10-CM | POA: Diagnosis not present

## 2020-02-04 DIAGNOSIS — Z89612 Acquired absence of left leg above knee: Secondary | ICD-10-CM | POA: Diagnosis not present

## 2020-02-04 DIAGNOSIS — E1122 Type 2 diabetes mellitus with diabetic chronic kidney disease: Secondary | ICD-10-CM | POA: Diagnosis not present

## 2020-02-04 DIAGNOSIS — E1151 Type 2 diabetes mellitus with diabetic peripheral angiopathy without gangrene: Secondary | ICD-10-CM | POA: Diagnosis not present

## 2020-02-04 DIAGNOSIS — I509 Heart failure, unspecified: Secondary | ICD-10-CM | POA: Diagnosis not present

## 2020-02-04 DIAGNOSIS — Z993 Dependence on wheelchair: Secondary | ICD-10-CM | POA: Diagnosis not present

## 2020-02-04 DIAGNOSIS — Z4801 Encounter for change or removal of surgical wound dressing: Secondary | ICD-10-CM | POA: Diagnosis not present

## 2020-02-04 DIAGNOSIS — Z8739 Personal history of other diseases of the musculoskeletal system and connective tissue: Secondary | ICD-10-CM | POA: Diagnosis not present

## 2020-02-04 DIAGNOSIS — E440 Moderate protein-calorie malnutrition: Secondary | ICD-10-CM | POA: Diagnosis not present

## 2020-02-04 DIAGNOSIS — M8580 Other specified disorders of bone density and structure, unspecified site: Secondary | ICD-10-CM | POA: Diagnosis not present

## 2020-02-04 DIAGNOSIS — Z4781 Encounter for orthopedic aftercare following surgical amputation: Secondary | ICD-10-CM | POA: Diagnosis not present

## 2020-02-04 DIAGNOSIS — Z8631 Personal history of diabetic foot ulcer: Secondary | ICD-10-CM | POA: Diagnosis not present

## 2020-02-04 DIAGNOSIS — L89151 Pressure ulcer of sacral region, stage 1: Secondary | ICD-10-CM | POA: Diagnosis not present

## 2020-02-04 DIAGNOSIS — Z89611 Acquired absence of right leg above knee: Secondary | ICD-10-CM | POA: Diagnosis not present

## 2020-02-04 DIAGNOSIS — Z602 Problems related to living alone: Secondary | ICD-10-CM | POA: Diagnosis not present

## 2020-02-04 DIAGNOSIS — M069 Rheumatoid arthritis, unspecified: Secondary | ICD-10-CM | POA: Diagnosis not present

## 2020-02-04 DIAGNOSIS — N189 Chronic kidney disease, unspecified: Secondary | ICD-10-CM | POA: Diagnosis not present

## 2020-02-04 DIAGNOSIS — Z7951 Long term (current) use of inhaled steroids: Secondary | ICD-10-CM | POA: Diagnosis not present

## 2020-02-07 DIAGNOSIS — Z4801 Encounter for change or removal of surgical wound dressing: Secondary | ICD-10-CM | POA: Diagnosis not present

## 2020-02-07 DIAGNOSIS — I130 Hypertensive heart and chronic kidney disease with heart failure and stage 1 through stage 4 chronic kidney disease, or unspecified chronic kidney disease: Secondary | ICD-10-CM | POA: Diagnosis not present

## 2020-02-07 DIAGNOSIS — Z591 Inadequate housing: Secondary | ICD-10-CM | POA: Diagnosis not present

## 2020-02-07 DIAGNOSIS — Z993 Dependence on wheelchair: Secondary | ICD-10-CM | POA: Diagnosis not present

## 2020-02-07 DIAGNOSIS — L89151 Pressure ulcer of sacral region, stage 1: Secondary | ICD-10-CM | POA: Diagnosis not present

## 2020-02-07 DIAGNOSIS — G546 Phantom limb syndrome with pain: Secondary | ICD-10-CM | POA: Diagnosis not present

## 2020-02-07 DIAGNOSIS — E440 Moderate protein-calorie malnutrition: Secondary | ICD-10-CM | POA: Diagnosis not present

## 2020-02-07 DIAGNOSIS — E039 Hypothyroidism, unspecified: Secondary | ICD-10-CM | POA: Diagnosis not present

## 2020-02-07 DIAGNOSIS — Z89611 Acquired absence of right leg above knee: Secondary | ICD-10-CM | POA: Diagnosis not present

## 2020-02-07 DIAGNOSIS — Z602 Problems related to living alone: Secondary | ICD-10-CM | POA: Diagnosis not present

## 2020-02-07 DIAGNOSIS — M069 Rheumatoid arthritis, unspecified: Secondary | ICD-10-CM | POA: Diagnosis not present

## 2020-02-07 DIAGNOSIS — Z89612 Acquired absence of left leg above knee: Secondary | ICD-10-CM | POA: Diagnosis not present

## 2020-02-07 DIAGNOSIS — M8580 Other specified disorders of bone density and structure, unspecified site: Secondary | ICD-10-CM | POA: Diagnosis not present

## 2020-02-07 DIAGNOSIS — N189 Chronic kidney disease, unspecified: Secondary | ICD-10-CM | POA: Diagnosis not present

## 2020-02-07 DIAGNOSIS — E1142 Type 2 diabetes mellitus with diabetic polyneuropathy: Secondary | ICD-10-CM | POA: Diagnosis not present

## 2020-02-07 DIAGNOSIS — Z8739 Personal history of other diseases of the musculoskeletal system and connective tissue: Secondary | ICD-10-CM | POA: Diagnosis not present

## 2020-02-07 DIAGNOSIS — E1151 Type 2 diabetes mellitus with diabetic peripheral angiopathy without gangrene: Secondary | ICD-10-CM | POA: Diagnosis not present

## 2020-02-07 DIAGNOSIS — Z4781 Encounter for orthopedic aftercare following surgical amputation: Secondary | ICD-10-CM | POA: Diagnosis not present

## 2020-02-07 DIAGNOSIS — I509 Heart failure, unspecified: Secondary | ICD-10-CM | POA: Diagnosis not present

## 2020-02-07 DIAGNOSIS — Z7951 Long term (current) use of inhaled steroids: Secondary | ICD-10-CM | POA: Diagnosis not present

## 2020-02-07 DIAGNOSIS — Z8631 Personal history of diabetic foot ulcer: Secondary | ICD-10-CM | POA: Diagnosis not present

## 2020-02-07 DIAGNOSIS — E1122 Type 2 diabetes mellitus with diabetic chronic kidney disease: Secondary | ICD-10-CM | POA: Diagnosis not present

## 2020-02-08 ENCOUNTER — Ambulatory Visit: Payer: Medicare Other | Admitting: Family Medicine

## 2020-02-09 DIAGNOSIS — Z4781 Encounter for orthopedic aftercare following surgical amputation: Secondary | ICD-10-CM | POA: Diagnosis not present

## 2020-02-09 DIAGNOSIS — I130 Hypertensive heart and chronic kidney disease with heart failure and stage 1 through stage 4 chronic kidney disease, or unspecified chronic kidney disease: Secondary | ICD-10-CM | POA: Diagnosis not present

## 2020-02-09 DIAGNOSIS — Z993 Dependence on wheelchair: Secondary | ICD-10-CM | POA: Diagnosis not present

## 2020-02-09 DIAGNOSIS — Z7951 Long term (current) use of inhaled steroids: Secondary | ICD-10-CM | POA: Diagnosis not present

## 2020-02-09 DIAGNOSIS — M069 Rheumatoid arthritis, unspecified: Secondary | ICD-10-CM | POA: Diagnosis not present

## 2020-02-09 DIAGNOSIS — Z602 Problems related to living alone: Secondary | ICD-10-CM | POA: Diagnosis not present

## 2020-02-09 DIAGNOSIS — E1151 Type 2 diabetes mellitus with diabetic peripheral angiopathy without gangrene: Secondary | ICD-10-CM | POA: Diagnosis not present

## 2020-02-09 DIAGNOSIS — E039 Hypothyroidism, unspecified: Secondary | ICD-10-CM | POA: Diagnosis not present

## 2020-02-09 DIAGNOSIS — Z89612 Acquired absence of left leg above knee: Secondary | ICD-10-CM | POA: Diagnosis not present

## 2020-02-09 DIAGNOSIS — I509 Heart failure, unspecified: Secondary | ICD-10-CM | POA: Diagnosis not present

## 2020-02-09 DIAGNOSIS — E1122 Type 2 diabetes mellitus with diabetic chronic kidney disease: Secondary | ICD-10-CM | POA: Diagnosis not present

## 2020-02-09 DIAGNOSIS — Z8631 Personal history of diabetic foot ulcer: Secondary | ICD-10-CM | POA: Diagnosis not present

## 2020-02-09 DIAGNOSIS — Z8739 Personal history of other diseases of the musculoskeletal system and connective tissue: Secondary | ICD-10-CM | POA: Diagnosis not present

## 2020-02-09 DIAGNOSIS — L89151 Pressure ulcer of sacral region, stage 1: Secondary | ICD-10-CM | POA: Diagnosis not present

## 2020-02-09 DIAGNOSIS — Z591 Inadequate housing: Secondary | ICD-10-CM | POA: Diagnosis not present

## 2020-02-09 DIAGNOSIS — G546 Phantom limb syndrome with pain: Secondary | ICD-10-CM | POA: Diagnosis not present

## 2020-02-09 DIAGNOSIS — N189 Chronic kidney disease, unspecified: Secondary | ICD-10-CM | POA: Diagnosis not present

## 2020-02-09 DIAGNOSIS — Z89611 Acquired absence of right leg above knee: Secondary | ICD-10-CM | POA: Diagnosis not present

## 2020-02-09 DIAGNOSIS — E1142 Type 2 diabetes mellitus with diabetic polyneuropathy: Secondary | ICD-10-CM | POA: Diagnosis not present

## 2020-02-09 DIAGNOSIS — Z4801 Encounter for change or removal of surgical wound dressing: Secondary | ICD-10-CM | POA: Diagnosis not present

## 2020-02-09 DIAGNOSIS — M8580 Other specified disorders of bone density and structure, unspecified site: Secondary | ICD-10-CM | POA: Diagnosis not present

## 2020-02-09 DIAGNOSIS — E440 Moderate protein-calorie malnutrition: Secondary | ICD-10-CM | POA: Diagnosis not present

## 2020-02-10 ENCOUNTER — Ambulatory Visit (INDEPENDENT_AMBULATORY_CARE_PROVIDER_SITE_OTHER): Payer: Medicare Other | Admitting: Physical Therapy

## 2020-02-10 ENCOUNTER — Encounter: Payer: Self-pay | Admitting: Physical Therapy

## 2020-02-10 ENCOUNTER — Other Ambulatory Visit: Payer: Self-pay

## 2020-02-10 DIAGNOSIS — Z9181 History of falling: Secondary | ICD-10-CM | POA: Diagnosis not present

## 2020-02-10 DIAGNOSIS — Z8739 Personal history of other diseases of the musculoskeletal system and connective tissue: Secondary | ICD-10-CM | POA: Diagnosis not present

## 2020-02-10 DIAGNOSIS — E1151 Type 2 diabetes mellitus with diabetic peripheral angiopathy without gangrene: Secondary | ICD-10-CM | POA: Diagnosis not present

## 2020-02-10 DIAGNOSIS — R293 Abnormal posture: Secondary | ICD-10-CM | POA: Diagnosis not present

## 2020-02-10 DIAGNOSIS — Z602 Problems related to living alone: Secondary | ICD-10-CM | POA: Diagnosis not present

## 2020-02-10 DIAGNOSIS — M6281 Muscle weakness (generalized): Secondary | ICD-10-CM

## 2020-02-10 DIAGNOSIS — G546 Phantom limb syndrome with pain: Secondary | ICD-10-CM | POA: Diagnosis not present

## 2020-02-10 DIAGNOSIS — M25651 Stiffness of right hip, not elsewhere classified: Secondary | ICD-10-CM

## 2020-02-10 DIAGNOSIS — Z993 Dependence on wheelchair: Secondary | ICD-10-CM | POA: Diagnosis not present

## 2020-02-10 DIAGNOSIS — Z4781 Encounter for orthopedic aftercare following surgical amputation: Secondary | ICD-10-CM | POA: Diagnosis not present

## 2020-02-10 DIAGNOSIS — M25652 Stiffness of left hip, not elsewhere classified: Secondary | ICD-10-CM

## 2020-02-10 DIAGNOSIS — N189 Chronic kidney disease, unspecified: Secondary | ICD-10-CM | POA: Diagnosis not present

## 2020-02-10 DIAGNOSIS — I130 Hypertensive heart and chronic kidney disease with heart failure and stage 1 through stage 4 chronic kidney disease, or unspecified chronic kidney disease: Secondary | ICD-10-CM | POA: Diagnosis not present

## 2020-02-10 DIAGNOSIS — M8580 Other specified disorders of bone density and structure, unspecified site: Secondary | ICD-10-CM | POA: Diagnosis not present

## 2020-02-10 DIAGNOSIS — E1142 Type 2 diabetes mellitus with diabetic polyneuropathy: Secondary | ICD-10-CM | POA: Diagnosis not present

## 2020-02-10 DIAGNOSIS — E039 Hypothyroidism, unspecified: Secondary | ICD-10-CM | POA: Diagnosis not present

## 2020-02-10 DIAGNOSIS — I509 Heart failure, unspecified: Secondary | ICD-10-CM | POA: Diagnosis not present

## 2020-02-10 DIAGNOSIS — Z89612 Acquired absence of left leg above knee: Secondary | ICD-10-CM | POA: Diagnosis not present

## 2020-02-10 DIAGNOSIS — Z4801 Encounter for change or removal of surgical wound dressing: Secondary | ICD-10-CM | POA: Diagnosis not present

## 2020-02-10 DIAGNOSIS — M069 Rheumatoid arthritis, unspecified: Secondary | ICD-10-CM | POA: Diagnosis not present

## 2020-02-10 DIAGNOSIS — Z7409 Other reduced mobility: Secondary | ICD-10-CM

## 2020-02-10 DIAGNOSIS — Z89611 Acquired absence of right leg above knee: Secondary | ICD-10-CM | POA: Diagnosis not present

## 2020-02-10 DIAGNOSIS — L89151 Pressure ulcer of sacral region, stage 1: Secondary | ICD-10-CM | POA: Diagnosis not present

## 2020-02-10 DIAGNOSIS — Z7951 Long term (current) use of inhaled steroids: Secondary | ICD-10-CM | POA: Diagnosis not present

## 2020-02-10 DIAGNOSIS — E1122 Type 2 diabetes mellitus with diabetic chronic kidney disease: Secondary | ICD-10-CM | POA: Diagnosis not present

## 2020-02-10 DIAGNOSIS — Z591 Inadequate housing: Secondary | ICD-10-CM | POA: Diagnosis not present

## 2020-02-10 DIAGNOSIS — Z8631 Personal history of diabetic foot ulcer: Secondary | ICD-10-CM | POA: Diagnosis not present

## 2020-02-10 DIAGNOSIS — E440 Moderate protein-calorie malnutrition: Secondary | ICD-10-CM | POA: Diagnosis not present

## 2020-02-10 NOTE — Therapy (Signed)
[facility] Physical Therapy [address] [place], [place], [address] Phone: [phone]   Fax:  [phone]  Physical Therapy Evaluation  Patient Details  Name: [name] MRN: [id number] Date of Birth: 1946-06-06 Referring Provider (PT): Chrisandra Netters, MD   Encounter Date: 02/10/2020   PT End of Session - 02/10/20 1757    Visit Number 1    Authorization Type UHC Medicare    PT Start Time 0825    PT Stop Time 0905    PT Time Calculation (min) 40 min    Activity Tolerance Patient tolerated treatment well    Behavior During Therapy [facility] for tasks assessed/performed           Past Medical History:  Diagnosis Date   Bilateral lower extremity edema 06/06/2014   Cataracts, bilateral 02/13/2011   Seen on eye exam 02/07/11. F/u in 12 months    Diabetes mellitus age 77   Diabetic ulcer of toe ([place]) 12/12/2015   Hyperlipidemia    Hypertension    Retinal detachment, old, partial    left   Retinopathy due to secondary diabetes mellitus ([place])    L>R, laser 3/07   Schizotypal personality disorder ([place])    Thyroid disease    Weight loss 01/22/2012    Past Surgical History:  Procedure Laterality Date   ABDOMINAL AORTOGRAM W/LOWER EXTREMITY N/A 07/23/2018   Procedure: ABDOMINAL AORTOGRAM W/LOWER EXTREMITY;  Surgeon: Marty Heck, MD;  Location: [place] [facility];  Service: Cardiovascular;  Laterality: N/A;   ABOVE KNEE LEG AMPUTATION Bilateral 09/27/2019   AMPUTATION Bilateral 09/27/2019   Procedure: AMPUTATION ABOVE KNEE;  Surgeon: Angelia Mould, MD;  Location: [facility];  Service: Vascular;  Laterality: Bilateral;   BREAST EXCISIONAL BIOPSY Bilateral    BREAST SURGERY     CATARACT EXTRACTION     left    ROTATOR CUFF REPAIR  1990's   left   Thyroid radiation ablation     for Graves Disease   TRANSTHORACIC ECHOCARDIOGRAM      EF55-65%, nml - 12/14/2004    There were no vitals filed for this visit.    Subjective  Assessment - 02/10/20 0831    Subjective This 74yo female was referred on 12/21/2019 s/p bilateral AKA by Chrisandra Netters, MD to determine if she is candidate for prostheses. Bilateral Transfemoral Ampuations on 09/27/2019.    Pertinent History bil. TFAs, PAD, dyspnea, DM2, retinopathy, neuropathy, obesity, RA, osteopenia, Schizotypal personality disorder, lung fibrosis, CHF,    Patient Stated Goals wants to see if she could get prostheses    Currently in Pain? Yes    Pain Score 7     Pain Location Leg   residual limb   Pain Orientation Left;Anterior;Lateral    Pain Descriptors / Indicators Sharp    Pain Type Chronic pain    Pain Onset More than a month ago    Pain Frequency Intermittent    Aggravating Factors  touching spot,    Pain Relieving Factors medications    Multiple Pain Sites Yes    Pain Score 0   In last week worst 10/10 (2 nights ago then for >36hrs)   Pain Location Other (Comment)   phantom pains both LEs   Pain Orientation Right;Left    Pain Descriptors / Indicators Stabbing    Pain Type Phantom pain    Pain Onset More than a month ago    Pain Frequency Intermittent    Aggravating Factors  worse at night, burping decreasing pain  Pain Relieving Factors pressing posterior limb into cold water bottle or orange, medications              [facility] PT Assessment - 02/10/20 0825      Assessment   Medical Diagnosis Bilateral Transfemoral Prostheses     Referring Provider (PT) Chrisandra Netters, MD    Onset Date/Surgical Date 12/21/19   MD referral to PT   Hand Dominance Right    Prior [facility] & HH[facility] still seeing her      Precautions   Precautions Fall      Balance Screen   Has the patient fallen in the past 6 months Yes    How many times? 1   fell asleep in w/c & fell forward   Has the patient had a decrease in activity level because of a fear of falling?  Yes    Is the patient reluctant to leave their home because of a fear of falling?  Yes        Home Environment   Living Environment Private residence    Living Arrangements Other relatives   2 adult grandsons   Available Help at Discharge Family    Type of [place] to enter    Entrance Stairs-Number of Steps 1    [place] One level    Home Equipment Wheelchair - manual;Walker - 4 wheels;Bedside commode;Hospital bed      Prior Function   Level of Independence Independent with community mobility with device   used rollator walker for ~2 years, in house used furniture   Vocation Retired      Art therapist   Posture/Postural Control Postural limitations    Postural Limitations Rounded Shoulders;Forward head;Decreased lumbar lordosis    Posture Comments seated posture      ROM / Strength   AROM / PROM / Strength PROM;Strength      PROM   Overall PROM  Deficits    Right Hip Extension -20   Supine Thomas position   Left Hip Extension -20   Supine Thomas position     Strength   Overall Strength Deficits    Right Shoulder Flexion 3-/5    Right Shoulder ABduction 3-/5    Left Shoulder Flexion 3-/5    Left Shoulder ABduction 3-/5    Right Elbow Flexion 3+/5    Right Elbow Extension 3-/5    Left Elbow Flexion 3+/5    Left Elbow Extension 3-/5    Right Hand Gross Grasp Impaired    Left Hand Gross Grasp Impaired    Right Hip Flexion 3-/5    Right Hip Extension 2/5    Right Hip ABduction 2+/5    Left Hip Flexion 3-/5    Left Hip Extension 2/5    Left Hip ABduction 2+/5      Bed Mobility   Bed Mobility Supine to Sit;Sit to Supine;Sitting - Scoot to Edge of Bed    Supine to Sit Independent with assistive device   pulls on PT arm as simulated bedrail   Sitting - Scoot to Edge of Bed Independent with assistive device    Sit to Supine Independent with assistive device   pulls on PT arm as simulated bedrail     Transfers   Transfers Anterior-Posterior Transfer    Anterior-Posterior Transfer 5:  Supervision;4: Min assist   Mat table 2" lower than w/c supervision to mat, minA to w/c  Ambulation/Gait   Ambulation/Gait No      Press photographer Both upper extremities    Distance 50' on flat indoor smooth surfaces      Balance   Balance Assessed Yes      Static Sitting Balance   Static Sitting - Balance Support No upper extremity supported    Static Sitting - Level of Assistance 5: Stand by assistance    Static Sitting - Comment/# of Minutes 2 minutes      Dynamic Sitting Balance   Dynamic Sitting - Balance Support Right upper extremity supported    Dynamic Sitting - Level of Assistance 5: Stand by assistance    Reach (Patient is able to reach ___ inches to right, left, forward, back) 2" anteriorly & laterally    Dynamic Sitting - Balance Activities Reaching for objects    Dynamic Sitting balance - Comments scans right / left & up / down with small cervical motions only            Prosthetics Assessment - 02/10/20 0825      Prosthetics   Edema none noted    Residual limb condition  bilateral:  no open areas, dry skin, cylinderical shape, normal color & temperature,  Left LE tenderness lateral to mid-Femur    K code/activity level with prosthetic use  K0 not a candidate at this time                     Objective measurements completed on examination: See above findings.               PT Education - 02/10/20 0900    Education Details Need to increase BUE & BLE strength to improve her potential to use prostheses    Person(s) Educated Patient    Methods Explanation;Verbal cues    Comprehension Verbalized understanding                       Plan - 02/10/20 1759    Clinical Impression Statement This 74yo female was referred to this PT specializing in prosthetic rehabilitation to evaluate for potential to use prostheses for her bilateral Transfemoral Amputations. She has multiple  co-morbidities limiting her activity tolerance.  She has significant weakness in bilateral upper & lower extremities. She has decreased hip extension range bilaterally. She requires assistance for transfers wtih 2" height difference. She does not appear to have potential to use prostheses due to weakness, activity tolerance, dependency in unlevel transfers and bed mobility (requires hospital bed).    Personal Factors and Comorbidities Comorbidity 3+;Fitness;Time since onset of injury/illness/exacerbation    Comorbidities bil. TFAs, PAD, dyspnea, DM2, retinopathy, neuropathy, obesity, RA, osteopenia, Schizotypal personality disorder, lung fibrosis, CHF,    Examination-Activity Limitations Bed Mobility;Sit;Transfers    PT Frequency One time visit    PT Next Visit Plan Patient is not a candidate at this time for bilateral Transfemoral Amputation prostheses.  If she improves her strength, then PT can reassess in ~6 months.    Recommended Other Services Patient may benefit from a Power w/c evaluation.    Consulted and Agree with Plan of Care Patient           Patient will benefit from skilled therapeutic intervention in order to improve the following deficits and impairments:  Decreased activity tolerance, Decreased balance, Decreased mobility, Decreased range of motion, Decreased strength, Postural dysfunction, Pain  Visit Diagnosis: Stiffness of left  hip, not elsewhere classified  Muscle weakness (generalized)  Stiffness of right hip, not elsewhere classified  Abnormal posture  History of fall  Decreased independence with transfers     Problem List Patient Active Problem List   Diagnosis Date Noted   At high risk for pressure injury of skin 12/16/2019   Uncontrolled type 2 diabetes mellitus with hyperglycemia ([place]) 11/12/2019   S/P AKA (above knee amputation) bilateral ([place]) 11/12/2019   Food insecurity 11/12/2019   Wound infection    Critical lower limb ischemia     Moderate protein-calorie malnutrition (H[facility])    Osteomyelitis ([place]) 09/23/2019   Left breast mass 06/02/2019   Stress 06/02/2019   Peripheral vascular disease ([place]) 07/22/2018   Dyspnea 03/23/2018   Impaired functional mobility, balance, gait, and endurance 03/02/2018    Class: Diagnosis of   Abnormality of gait 02/26/2018   Iron deficiency anemia 03/19/2017   Vision disturbance 03/20/2015   Memory difficulty 03/20/2015   At high risk for falls 11/10/2012   DIABETIC  RETINOPATHY 03/17/2007   Type 2 diabetes mellitus with diabetic neuropathy, unspecified ([place]) 03/17/2007   Hypothyroidism 10/16/2006   Type 2 diabetes mellitus ([place]) 10/16/2006   OBESITY, NOS 10/16/2006   HYPERTENSION, BENIGN SYSTEMIC 10/16/2006   GASTROESOPHAGEAL REFLUX, NO ESOPHAGITIS 10/16/2006   Rheumatoid arthritis ([place]) 10/16/2006   Disorder of bone and cartilage 10/16/2006   INCONTINENCE, URGE 10/16/2006    Jamey Reas PT, DPT 02/10/2020, 6:09 PM  [facility] Physical Therapy [address] [place], [place], [address] Phone: [phone]   Fax:  [phone]  Name: [name] MRN: [id number] Date of Birth: 1946/04/22

## 2020-02-11 ENCOUNTER — Telehealth: Payer: Medicare Other

## 2020-02-14 ENCOUNTER — Other Ambulatory Visit: Payer: Self-pay

## 2020-02-14 ENCOUNTER — Ambulatory Visit: Payer: Medicare Other

## 2020-02-14 ENCOUNTER — Telehealth: Payer: Medicare Other

## 2020-02-14 DIAGNOSIS — E440 Moderate protein-calorie malnutrition: Secondary | ICD-10-CM | POA: Diagnosis not present

## 2020-02-14 DIAGNOSIS — Z8739 Personal history of other diseases of the musculoskeletal system and connective tissue: Secondary | ICD-10-CM | POA: Diagnosis not present

## 2020-02-14 DIAGNOSIS — Z8631 Personal history of diabetic foot ulcer: Secondary | ICD-10-CM | POA: Diagnosis not present

## 2020-02-14 DIAGNOSIS — I130 Hypertensive heart and chronic kidney disease with heart failure and stage 1 through stage 4 chronic kidney disease, or unspecified chronic kidney disease: Secondary | ICD-10-CM | POA: Diagnosis not present

## 2020-02-14 DIAGNOSIS — E1122 Type 2 diabetes mellitus with diabetic chronic kidney disease: Secondary | ICD-10-CM | POA: Diagnosis not present

## 2020-02-14 DIAGNOSIS — Z89612 Acquired absence of left leg above knee: Secondary | ICD-10-CM | POA: Diagnosis not present

## 2020-02-14 DIAGNOSIS — G546 Phantom limb syndrome with pain: Secondary | ICD-10-CM | POA: Diagnosis not present

## 2020-02-14 DIAGNOSIS — M8580 Other specified disorders of bone density and structure, unspecified site: Secondary | ICD-10-CM | POA: Diagnosis not present

## 2020-02-14 DIAGNOSIS — Z591 Inadequate housing: Secondary | ICD-10-CM | POA: Diagnosis not present

## 2020-02-14 DIAGNOSIS — I509 Heart failure, unspecified: Secondary | ICD-10-CM | POA: Diagnosis not present

## 2020-02-14 DIAGNOSIS — L89151 Pressure ulcer of sacral region, stage 1: Secondary | ICD-10-CM | POA: Diagnosis not present

## 2020-02-14 DIAGNOSIS — Z89611 Acquired absence of right leg above knee: Secondary | ICD-10-CM | POA: Diagnosis not present

## 2020-02-14 DIAGNOSIS — E1151 Type 2 diabetes mellitus with diabetic peripheral angiopathy without gangrene: Secondary | ICD-10-CM | POA: Diagnosis not present

## 2020-02-14 DIAGNOSIS — Z7951 Long term (current) use of inhaled steroids: Secondary | ICD-10-CM | POA: Diagnosis not present

## 2020-02-14 DIAGNOSIS — Z602 Problems related to living alone: Secondary | ICD-10-CM | POA: Diagnosis not present

## 2020-02-14 DIAGNOSIS — N189 Chronic kidney disease, unspecified: Secondary | ICD-10-CM | POA: Diagnosis not present

## 2020-02-14 DIAGNOSIS — M069 Rheumatoid arthritis, unspecified: Secondary | ICD-10-CM | POA: Diagnosis not present

## 2020-02-14 DIAGNOSIS — Z4801 Encounter for change or removal of surgical wound dressing: Secondary | ICD-10-CM | POA: Diagnosis not present

## 2020-02-14 DIAGNOSIS — Z4781 Encounter for orthopedic aftercare following surgical amputation: Secondary | ICD-10-CM | POA: Diagnosis not present

## 2020-02-14 DIAGNOSIS — Z993 Dependence on wheelchair: Secondary | ICD-10-CM | POA: Diagnosis not present

## 2020-02-14 DIAGNOSIS — E039 Hypothyroidism, unspecified: Secondary | ICD-10-CM | POA: Diagnosis not present

## 2020-02-14 DIAGNOSIS — E1142 Type 2 diabetes mellitus with diabetic polyneuropathy: Secondary | ICD-10-CM | POA: Diagnosis not present

## 2020-02-15 ENCOUNTER — Ambulatory Visit: Payer: Medicare Other | Admitting: Licensed Clinical Social Worker

## 2020-02-15 DIAGNOSIS — Z7189 Other specified counseling: Secondary | ICD-10-CM

## 2020-02-15 NOTE — Patient Instructions (Signed)
Visit Information  Goals Addressed              This Visit's Progress   .  " I am confused with having my legs amputatated and trying to cope" (pt-stated)        CARE PLAN ENTRY (see longtitudinal plan of care for additional care plan information)  Current Barriers:  . Chronic Disease Management support and education needs/crisis intervention in patient with bilateral below the knee amputation, Iron deficiency anemia, and community resources/urgent housing crisis  . Knowledge Deficits related to imbalanced nutrition less than body requirements as evidenced by Moderate protein-calorie malnutrition  Nurse Case Manager Clinical Goal(s):  Marland Kitchen Over the next 30 days, patient will verbalize understanding of plan  Interventions:  . Evaluation of current treatment plan related patient's adherence to plan as established by provider. . Provided education to patient re: nutrition and wound healing . Collaborated with SW regarding transportation . Discussed plans with patient for ongoing care management follow up and provided patient with direct contact information for care management team . Patient stated that [facility] came to her home and she did not have hot water or heat.  They called APS and she had 30 days to fix the problem.The patient states that she has lived in the home and has not had hot water or heat for over 2 years and done well.  She has spoken to people to fix the problem and it will cost 6k.  She has her grandsons that are staying with her.  They provide food for her.  They are mainly eating out.  She states that she does have canned food in the home but nothing in the frig.  She states that she is confused with all of the people she has spoken with and can't remember names.  Social Worker [name] was on the phone by speaker in the room talking with the patient . Patient was given depends for incontinence,  a box of food and a scat applications for transportation. I will f/u with the  patient on 11/15/19  . 02/14/20 . Patient states states that she is doing well . She states that OT last day was yesterday but they are working trying to help her get an Radiation protection practitioner . PT will be coming until July .  The nurse is still coming once a week . She states the Moms meals stopped coming for about 2 weeks.  I advised her I would inform D. Laurance Flatten so that she could check into it. . She also stated that John  at [phone] had called about the ramp and she needed to send in more information by July 5th  I advised her to make sure to send them the information in that they require. She stated that they needed her bills, Picture ID     . Patient Self Care Activities:  . Patient verbalizes understanding of plan  . Attends all scheduled provider appointments . Calls provider office for new concerns or questions . Unable to independently self manage community resources  Please see past updates related to this goal by clicking on the "Past Updates" button in the selected goal         Ms. Finkle was given information about Care Management services today including:  1. Care Management services include personalized support from designated clinical staff supervised by her physician, including individualized plan of care and coordination with other care providers 2. 24/7 contact phone numbers for assistance for urgent and  routine care needs. 3. The patient may stop CCM services at any time (effective at the end of the month) by phone call to the office staff.  Patient agreed to services and verbal consent obtained.   The patient verbalized understanding of instructions provided today and declined a print copy of patient instruction materials.   The care management team will reach out to the patient again over the next 14 days.  The patient has been provided with contact information for the care management team and has been advised to call with any health related questions or concerns.    [name] RN, BSN, [facility] Care Management Coordinator [place] Phone: [phone] Fax: [phone]

## 2020-02-15 NOTE — Chronic Care Management (AMB) (Signed)
Care Management   Follow Up Note   02/15/2020 Name: JAMAL HASKIN MRN: [id number] DOB: 1946/03/15  Referred by: [name], MD Reason for referral : Care Coordination (Care Management RNCM HTN)   [place] DESHANA ROMINGER is a 74 y.o. year old female who is a primary care patient of [name], MD. The care management team was consulted for assistance with care management and care coordination needs.    Review of patient status, including review of consultants reports, relevant laboratory and other test results, and collaboration with appropriate care team members and the patient's provider was performed as part of comprehensive patient evaluation and provision of chronic care management services.    SDOH (Social Determinants of Health) assessments performed: No See Care Plan activities for detailed interventions related to [facility])     Advanced Directives: See Care Plan and Vynca application for related entries.   Goals Addressed              This Visit's Progress   .  " I am confused with having my legs amputatated and trying to cope" (pt-stated)        CARE PLAN ENTRY (see longtitudinal plan of care for additional care plan information)  Current Barriers:  . Chronic Disease Management support and education needs/crisis intervention in patient with bilateral below the knee amputation, Iron deficiency anemia, and community resources/urgent housing crisis  . Knowledge Deficits related to imbalanced nutrition less than body requirements as evidenced by Moderate protein-calorie malnutrition  Nurse Case Manager Clinical Goal(s):  Marland Kitchen Over the next 30 days, patient will verbalize understanding of plan  Interventions:  . Evaluation of current treatment plan related patient's adherence to plan as established by provider. . Provided education to patient re: nutrition and wound healing . Collaborated with SW regarding transportation . Discussed plans with patient for ongoing care  management follow up and provided patient with direct contact information for care management team . Patient stated that [facility] came to her home and she did not have hot water or heat.  They called APS and she had 30 days to fix the problem.The patient states that she has lived in the home and has not had hot water or heat for over 2 years and done well.  She has spoken to people to fix the problem and it will cost 6k.  She has her grandsons that are staying with her.  They provide food for her.  They are mainly eating out.  She states that she does have canned food in the home but nothing in the frig.  She states that she is confused with all of the people she has spoken with and can't remember names.  Social Worker [name] was on the phone by speaker in the room talking with the patient . Patient was given depends for incontinence,  a box of food and a scat applications for transportation. I will f/u with the patient on 11/15/19  . 02/14/20 . Patient states states that she is doing well . She states that OT last day was yesterday but they are working trying to help her get an Radiation protection practitioner . PT will be coming until July .  The nurse is still coming once a week . She states the Moms meals stopped coming for about 2 weeks.  I advised her I would inform D. Laurance Flatten so that she could check into it. . She also stated that John  at [phone] had called about the ramp  and she needed to send in more information by July 5th  I advised her to make sure to send them the information in that they require. She stated that they needed her bills, Picture ID     . Patient Self Care Activities:  . Patient verbalizes understanding of plan  . Attends all scheduled provider appointments . Calls provider office for new concerns or questions . Unable to independently self manage community resources  Please see past updates related to this goal by clicking on the "Past Updates" button in the selected goal            The care management team will reach out to the patient again over the next 14 days.  The patient has been provided with contact information for the care management team and has been advised to call with any health related questions or concerns.   [name] RN, BSN, [facility] Care Management Coordinator [place] Phone: [phone] Fax: [phone]

## 2020-02-15 NOTE — Chronic Care Management (AMB) (Signed)
Care Management   Clinical Social Work Follow Up   02/15/2020 Name: [name] MRN: [id number] DOB: May 16, 1946 Referred by: Kari Rio, MD  Reason for referral : Care Coordination (F/U call)  [name] is a 74 y.o. year old female who is a primary care patient of Kari Mems Delorse Limber, MD.  Reason for follow-up: assess for barriers and progress with advance directive. see care plan for goals .   Plan:  1. Patient will contact LCSW as needed  2.  LCSW will F/U with patient based on collaboration with CCM RN Advance Directive Status: N See Care Plan for related entries.   SDOH (Social Determinants of Health) assessments performed: Yes ; No needs identified   Goals Addressed            This Visit's Progress    advance directives   Not on track    [place] (see longitudinal plan of care for additional care plan information)  Current Barriers & Progress:   Patient  does not have an Advance Directive  Acknowledges deficits, education and support in order to complete this document  Limited education about the importance of naming a healthcare power of attorney  Patient has received advance directive packet has not completed Clinical Social Work Goal(s):   Over the next 45 days, the patient will complete Advance Directive, notarize and provide a copy to provider office Interventions provided by LCSW:  A voluntary discussion about advanced care planning including importance of advanced directives, healthcare proxy and living will was discussed with the patient.   Advised patient to review information mailed by LCSW Patient Self Care Activities:   Is able to complete documentation independently  Able to identify next of kin or [place] of Attorney/Health Care Agent  Patient will review information and will call LCSW when she is ready to complete Please see past updates related to this goal by clicking on the "Past Updates" button in the selected  goal      I need a wheelchair ramp   Not on track    [place] (see longitudinal plan of care for additional care plan information)  Current Barriers & progress:   Patient needs community resources to assist with wheelchair ramp   Acknowledges deficits and needs support, education and care coordination in order to meet this unmet need   Patient received voice message about ramp from agency, has called several times but unable to speak to anyone  Patient has spoken with [name] from [employer] about her ramp. Informed patient what she needs to provide to move forward  Patient provided requested information [employer] has asked for additional information.  Patient understands she has until July 5th to get all information returned  Clinical Goal(s)   Over the next 160 days, patient will work with LCSW to address concerns related to wheelchair ramp Interventions provided by LCSW:   Assessed patient's barriers with to connecting to [employer]( patient understands what she needs to do and her grandson will help her) Patient Self Care Activities & Deficits:   Patient is unable to independently navigate community resource options without care coordination support   Acknowledges deficits and is motivated to resolve concern   Patient will provided needed information with help from family Please see past updates related to this goal by clicking on the "Past Updates" button in the selected goal       meals on wheels   On track  CARE PLAN ENTRY (see longitudinal plan of care for additional care plan information)  Current Barriers & Progress:   Patient needs community resources for meals on wheels  Acknowledges deficits and needs support, education and care coordination in order to meet this unmet need   Patient is unable to prepare meals and rely's on grandson to bring her fast food  Patient is on wait list for mobile meals  Informed patient she  will receive [employer] for 6 months with delivery once per month Clinical Goal(s)   Over the next 120 days patient will have food needs met by meals on wheels Interventions provided by LCSW:   Assessed patient's care coordination needs related to healthy food  patient on wait list effective April 13th for mobile meals  Collaborated with CCM RN for ongoing patient needs  [employer] for update on meal delivery (next meal delivery July 9th) Patient Self Care Activities & Deficits:   Patient is unable to independently navigate community resource options without care coordination support   Acknowledges deficits and is motivated to resolve concern  Please see past updates related to this goal by clicking on the "Past Updates" button in the selected goal        Outpatient Encounter Medications as of 02/15/2020  Medication Sig   acetaminophen (TYLENOL) 325 MG tablet Take 2 tablets (650 mg total) by mouth every 6 (six) hours. (Patient not taking: Reported on 02/10/2020)   Blood Glucose Monitoring Suppl (ONETOUCH VERIO) w/Device KIT Check sugar 3 times per day   feeding supplement, ENSURE ENLIVE, (ENSURE ENLIVE) LIQD Take 237 mLs by mouth 3 (three) times daily between meals.   ferrous sulfate 324 MG TBEC Take 1 tablet (324 mg total) by mouth daily with breakfast.   furosemide (LASIX) 40 MG tablet Take 1 tablet (40 mg total) by mouth daily.   gabapentin (NEURONTIN) 100 MG capsule Take 1 capsule (100 mg total) by mouth every 8 (eight) hours.   glucose blood (ONETOUCH VERIO) test strip Check blood sugar daily   Lancet Devices (MICROLET NEXT LANCING DEVICE) MISC 1 each by Does not apply route 3 (three) times daily. Check blood sugar 3 times daily   levothyroxine (SYNTHROID) 150 MCG tablet Take 1 tablet (150 mcg total) by mouth daily.   lidocaine (LIDODERM) 5 % Apply one patch to each lower limbs for 12 hours a day as needed for phantom pain. Remove after 12 hours or as directed  by MD   [facility] DELICA LANCETS 01B MISC Check blood sugar once daily   PROAIR HFA 108 (90 Base) MCG/ACT inhaler INHALE 2 PUFFS INTO THE LUNGS EVERY 6 HOURS AS NEEDED FOR WHEEZING OR SHORTNESS OF BREATH   rosuvastatin (CRESTOR) 20 MG tablet Take 1 tablet (20 mg total) by mouth daily.   No facility-administered encounter medications on file as of 02/15/2020.    Review of patient status, including review of consultants reports, relevant laboratory and other test results, and collaboration with appropriate care team members and the patient's provider was performed as part of comprehensive patient evaluation and provision of care management services.    [name], [place] / [place]   [phone] 2:15 PM

## 2020-02-16 DIAGNOSIS — E1122 Type 2 diabetes mellitus with diabetic chronic kidney disease: Secondary | ICD-10-CM | POA: Diagnosis not present

## 2020-02-16 DIAGNOSIS — Z89612 Acquired absence of left leg above knee: Secondary | ICD-10-CM | POA: Diagnosis not present

## 2020-02-16 DIAGNOSIS — Z4801 Encounter for change or removal of surgical wound dressing: Secondary | ICD-10-CM | POA: Diagnosis not present

## 2020-02-16 DIAGNOSIS — Z8631 Personal history of diabetic foot ulcer: Secondary | ICD-10-CM | POA: Diagnosis not present

## 2020-02-16 DIAGNOSIS — E1142 Type 2 diabetes mellitus with diabetic polyneuropathy: Secondary | ICD-10-CM | POA: Diagnosis not present

## 2020-02-16 DIAGNOSIS — L89151 Pressure ulcer of sacral region, stage 1: Secondary | ICD-10-CM | POA: Diagnosis not present

## 2020-02-16 DIAGNOSIS — G546 Phantom limb syndrome with pain: Secondary | ICD-10-CM | POA: Diagnosis not present

## 2020-02-16 DIAGNOSIS — I509 Heart failure, unspecified: Secondary | ICD-10-CM | POA: Diagnosis not present

## 2020-02-16 DIAGNOSIS — Z602 Problems related to living alone: Secondary | ICD-10-CM | POA: Diagnosis not present

## 2020-02-16 DIAGNOSIS — M069 Rheumatoid arthritis, unspecified: Secondary | ICD-10-CM | POA: Diagnosis not present

## 2020-02-16 DIAGNOSIS — E440 Moderate protein-calorie malnutrition: Secondary | ICD-10-CM | POA: Diagnosis not present

## 2020-02-16 DIAGNOSIS — I130 Hypertensive heart and chronic kidney disease with heart failure and stage 1 through stage 4 chronic kidney disease, or unspecified chronic kidney disease: Secondary | ICD-10-CM | POA: Diagnosis not present

## 2020-02-16 DIAGNOSIS — Z993 Dependence on wheelchair: Secondary | ICD-10-CM | POA: Diagnosis not present

## 2020-02-16 DIAGNOSIS — Z89611 Acquired absence of right leg above knee: Secondary | ICD-10-CM | POA: Diagnosis not present

## 2020-02-16 DIAGNOSIS — E039 Hypothyroidism, unspecified: Secondary | ICD-10-CM | POA: Diagnosis not present

## 2020-02-16 DIAGNOSIS — Z7951 Long term (current) use of inhaled steroids: Secondary | ICD-10-CM | POA: Diagnosis not present

## 2020-02-16 DIAGNOSIS — Z591 Inadequate housing: Secondary | ICD-10-CM | POA: Diagnosis not present

## 2020-02-16 DIAGNOSIS — E1151 Type 2 diabetes mellitus with diabetic peripheral angiopathy without gangrene: Secondary | ICD-10-CM | POA: Diagnosis not present

## 2020-02-16 DIAGNOSIS — N189 Chronic kidney disease, unspecified: Secondary | ICD-10-CM | POA: Diagnosis not present

## 2020-02-16 DIAGNOSIS — M8580 Other specified disorders of bone density and structure, unspecified site: Secondary | ICD-10-CM | POA: Diagnosis not present

## 2020-02-16 DIAGNOSIS — Z8739 Personal history of other diseases of the musculoskeletal system and connective tissue: Secondary | ICD-10-CM | POA: Diagnosis not present

## 2020-02-16 DIAGNOSIS — Z4781 Encounter for orthopedic aftercare following surgical amputation: Secondary | ICD-10-CM | POA: Diagnosis not present

## 2020-02-17 DIAGNOSIS — Z591 Inadequate housing: Secondary | ICD-10-CM | POA: Diagnosis not present

## 2020-02-17 DIAGNOSIS — Z7951 Long term (current) use of inhaled steroids: Secondary | ICD-10-CM | POA: Diagnosis not present

## 2020-02-17 DIAGNOSIS — M8580 Other specified disorders of bone density and structure, unspecified site: Secondary | ICD-10-CM | POA: Diagnosis not present

## 2020-02-17 DIAGNOSIS — L89151 Pressure ulcer of sacral region, stage 1: Secondary | ICD-10-CM | POA: Diagnosis not present

## 2020-02-17 DIAGNOSIS — E1122 Type 2 diabetes mellitus with diabetic chronic kidney disease: Secondary | ICD-10-CM | POA: Diagnosis not present

## 2020-02-17 DIAGNOSIS — Z602 Problems related to living alone: Secondary | ICD-10-CM | POA: Diagnosis not present

## 2020-02-17 DIAGNOSIS — Z4801 Encounter for change or removal of surgical wound dressing: Secondary | ICD-10-CM | POA: Diagnosis not present

## 2020-02-17 DIAGNOSIS — E1151 Type 2 diabetes mellitus with diabetic peripheral angiopathy without gangrene: Secondary | ICD-10-CM | POA: Diagnosis not present

## 2020-02-17 DIAGNOSIS — I509 Heart failure, unspecified: Secondary | ICD-10-CM | POA: Diagnosis not present

## 2020-02-17 DIAGNOSIS — M069 Rheumatoid arthritis, unspecified: Secondary | ICD-10-CM | POA: Diagnosis not present

## 2020-02-17 DIAGNOSIS — G546 Phantom limb syndrome with pain: Secondary | ICD-10-CM | POA: Diagnosis not present

## 2020-02-17 DIAGNOSIS — E1142 Type 2 diabetes mellitus with diabetic polyneuropathy: Secondary | ICD-10-CM | POA: Diagnosis not present

## 2020-02-17 DIAGNOSIS — Z89611 Acquired absence of right leg above knee: Secondary | ICD-10-CM | POA: Diagnosis not present

## 2020-02-17 DIAGNOSIS — E039 Hypothyroidism, unspecified: Secondary | ICD-10-CM | POA: Diagnosis not present

## 2020-02-17 DIAGNOSIS — Z8631 Personal history of diabetic foot ulcer: Secondary | ICD-10-CM | POA: Diagnosis not present

## 2020-02-17 DIAGNOSIS — N189 Chronic kidney disease, unspecified: Secondary | ICD-10-CM | POA: Diagnosis not present

## 2020-02-17 DIAGNOSIS — E440 Moderate protein-calorie malnutrition: Secondary | ICD-10-CM | POA: Diagnosis not present

## 2020-02-17 DIAGNOSIS — Z4781 Encounter for orthopedic aftercare following surgical amputation: Secondary | ICD-10-CM | POA: Diagnosis not present

## 2020-02-17 DIAGNOSIS — I130 Hypertensive heart and chronic kidney disease with heart failure and stage 1 through stage 4 chronic kidney disease, or unspecified chronic kidney disease: Secondary | ICD-10-CM | POA: Diagnosis not present

## 2020-02-17 DIAGNOSIS — Z89612 Acquired absence of left leg above knee: Secondary | ICD-10-CM | POA: Diagnosis not present

## 2020-02-17 DIAGNOSIS — Z993 Dependence on wheelchair: Secondary | ICD-10-CM | POA: Diagnosis not present

## 2020-02-17 DIAGNOSIS — Z8739 Personal history of other diseases of the musculoskeletal system and connective tissue: Secondary | ICD-10-CM | POA: Diagnosis not present

## 2020-02-18 ENCOUNTER — Ambulatory Visit
Admission: RE | Admit: 2020-02-18 | Discharge: 2020-02-18 | Disposition: A | Discharge status: Home / Self Care | Payer: Medicare Other | Source: Ambulatory Visit | Attending: Family Medicine | Admitting: Family Medicine

## 2020-02-18 DIAGNOSIS — R053 Chronic cough: Secondary | ICD-10-CM

## 2020-02-18 DIAGNOSIS — R05 Cough: Secondary | ICD-10-CM | POA: Diagnosis not present

## 2020-02-22 DIAGNOSIS — L89151 Pressure ulcer of sacral region, stage 1: Secondary | ICD-10-CM | POA: Diagnosis not present

## 2020-02-22 DIAGNOSIS — Z4801 Encounter for change or removal of surgical wound dressing: Secondary | ICD-10-CM | POA: Diagnosis not present

## 2020-02-22 DIAGNOSIS — E039 Hypothyroidism, unspecified: Secondary | ICD-10-CM | POA: Diagnosis not present

## 2020-02-22 DIAGNOSIS — Z602 Problems related to living alone: Secondary | ICD-10-CM | POA: Diagnosis not present

## 2020-02-22 DIAGNOSIS — Z89612 Acquired absence of left leg above knee: Secondary | ICD-10-CM | POA: Diagnosis not present

## 2020-02-22 DIAGNOSIS — E1142 Type 2 diabetes mellitus with diabetic polyneuropathy: Secondary | ICD-10-CM | POA: Diagnosis not present

## 2020-02-22 DIAGNOSIS — Z8739 Personal history of other diseases of the musculoskeletal system and connective tissue: Secondary | ICD-10-CM | POA: Diagnosis not present

## 2020-02-22 DIAGNOSIS — Z8631 Personal history of diabetic foot ulcer: Secondary | ICD-10-CM | POA: Diagnosis not present

## 2020-02-22 DIAGNOSIS — Z4781 Encounter for orthopedic aftercare following surgical amputation: Secondary | ICD-10-CM | POA: Diagnosis not present

## 2020-02-22 DIAGNOSIS — M069 Rheumatoid arthritis, unspecified: Secondary | ICD-10-CM | POA: Diagnosis not present

## 2020-02-22 DIAGNOSIS — M8580 Other specified disorders of bone density and structure, unspecified site: Secondary | ICD-10-CM | POA: Diagnosis not present

## 2020-02-22 DIAGNOSIS — Z591 Inadequate housing: Secondary | ICD-10-CM | POA: Diagnosis not present

## 2020-02-22 DIAGNOSIS — E440 Moderate protein-calorie malnutrition: Secondary | ICD-10-CM | POA: Diagnosis not present

## 2020-02-22 DIAGNOSIS — I509 Heart failure, unspecified: Secondary | ICD-10-CM | POA: Diagnosis not present

## 2020-02-22 DIAGNOSIS — E1122 Type 2 diabetes mellitus with diabetic chronic kidney disease: Secondary | ICD-10-CM | POA: Diagnosis not present

## 2020-02-22 DIAGNOSIS — Z993 Dependence on wheelchair: Secondary | ICD-10-CM | POA: Diagnosis not present

## 2020-02-22 DIAGNOSIS — N189 Chronic kidney disease, unspecified: Secondary | ICD-10-CM | POA: Diagnosis not present

## 2020-02-22 DIAGNOSIS — Z7951 Long term (current) use of inhaled steroids: Secondary | ICD-10-CM | POA: Diagnosis not present

## 2020-02-22 DIAGNOSIS — E1151 Type 2 diabetes mellitus with diabetic peripheral angiopathy without gangrene: Secondary | ICD-10-CM | POA: Diagnosis not present

## 2020-02-22 DIAGNOSIS — Z89611 Acquired absence of right leg above knee: Secondary | ICD-10-CM | POA: Diagnosis not present

## 2020-02-22 DIAGNOSIS — I130 Hypertensive heart and chronic kidney disease with heart failure and stage 1 through stage 4 chronic kidney disease, or unspecified chronic kidney disease: Secondary | ICD-10-CM | POA: Diagnosis not present

## 2020-02-22 DIAGNOSIS — G546 Phantom limb syndrome with pain: Secondary | ICD-10-CM | POA: Diagnosis not present

## 2020-02-25 DIAGNOSIS — G546 Phantom limb syndrome with pain: Secondary | ICD-10-CM | POA: Diagnosis not present

## 2020-02-25 DIAGNOSIS — Z4801 Encounter for change or removal of surgical wound dressing: Secondary | ICD-10-CM | POA: Diagnosis not present

## 2020-02-25 DIAGNOSIS — Z602 Problems related to living alone: Secondary | ICD-10-CM | POA: Diagnosis not present

## 2020-02-25 DIAGNOSIS — E1142 Type 2 diabetes mellitus with diabetic polyneuropathy: Secondary | ICD-10-CM | POA: Diagnosis not present

## 2020-02-25 DIAGNOSIS — E1122 Type 2 diabetes mellitus with diabetic chronic kidney disease: Secondary | ICD-10-CM | POA: Diagnosis not present

## 2020-02-25 DIAGNOSIS — E039 Hypothyroidism, unspecified: Secondary | ICD-10-CM | POA: Diagnosis not present

## 2020-02-25 DIAGNOSIS — Z7951 Long term (current) use of inhaled steroids: Secondary | ICD-10-CM | POA: Diagnosis not present

## 2020-02-25 DIAGNOSIS — Z8631 Personal history of diabetic foot ulcer: Secondary | ICD-10-CM | POA: Diagnosis not present

## 2020-02-25 DIAGNOSIS — Z8739 Personal history of other diseases of the musculoskeletal system and connective tissue: Secondary | ICD-10-CM | POA: Diagnosis not present

## 2020-02-25 DIAGNOSIS — I509 Heart failure, unspecified: Secondary | ICD-10-CM | POA: Diagnosis not present

## 2020-02-25 DIAGNOSIS — Z89612 Acquired absence of left leg above knee: Secondary | ICD-10-CM | POA: Diagnosis not present

## 2020-02-25 DIAGNOSIS — M069 Rheumatoid arthritis, unspecified: Secondary | ICD-10-CM | POA: Diagnosis not present

## 2020-02-25 DIAGNOSIS — Z993 Dependence on wheelchair: Secondary | ICD-10-CM | POA: Diagnosis not present

## 2020-02-25 DIAGNOSIS — L89151 Pressure ulcer of sacral region, stage 1: Secondary | ICD-10-CM | POA: Diagnosis not present

## 2020-02-25 DIAGNOSIS — N189 Chronic kidney disease, unspecified: Secondary | ICD-10-CM | POA: Diagnosis not present

## 2020-02-25 DIAGNOSIS — Z4781 Encounter for orthopedic aftercare following surgical amputation: Secondary | ICD-10-CM | POA: Diagnosis not present

## 2020-02-25 DIAGNOSIS — E440 Moderate protein-calorie malnutrition: Secondary | ICD-10-CM | POA: Diagnosis not present

## 2020-02-25 DIAGNOSIS — E1151 Type 2 diabetes mellitus with diabetic peripheral angiopathy without gangrene: Secondary | ICD-10-CM | POA: Diagnosis not present

## 2020-02-25 DIAGNOSIS — M8580 Other specified disorders of bone density and structure, unspecified site: Secondary | ICD-10-CM | POA: Diagnosis not present

## 2020-02-25 DIAGNOSIS — Z89611 Acquired absence of right leg above knee: Secondary | ICD-10-CM | POA: Diagnosis not present

## 2020-02-25 DIAGNOSIS — Z591 Inadequate housing: Secondary | ICD-10-CM | POA: Diagnosis not present

## 2020-02-25 DIAGNOSIS — I130 Hypertensive heart and chronic kidney disease with heart failure and stage 1 through stage 4 chronic kidney disease, or unspecified chronic kidney disease: Secondary | ICD-10-CM | POA: Diagnosis not present

## 2020-02-28 ENCOUNTER — Other Ambulatory Visit: Payer: Self-pay

## 2020-02-28 ENCOUNTER — Ambulatory Visit: Payer: Medicare Other

## 2020-02-28 DIAGNOSIS — M069 Rheumatoid arthritis, unspecified: Secondary | ICD-10-CM | POA: Diagnosis not present

## 2020-02-28 DIAGNOSIS — I130 Hypertensive heart and chronic kidney disease with heart failure and stage 1 through stage 4 chronic kidney disease, or unspecified chronic kidney disease: Secondary | ICD-10-CM | POA: Diagnosis not present

## 2020-02-28 DIAGNOSIS — E039 Hypothyroidism, unspecified: Secondary | ICD-10-CM | POA: Diagnosis not present

## 2020-02-28 DIAGNOSIS — N189 Chronic kidney disease, unspecified: Secondary | ICD-10-CM | POA: Diagnosis not present

## 2020-02-28 DIAGNOSIS — E1142 Type 2 diabetes mellitus with diabetic polyneuropathy: Secondary | ICD-10-CM | POA: Diagnosis not present

## 2020-02-28 DIAGNOSIS — Z89611 Acquired absence of right leg above knee: Secondary | ICD-10-CM | POA: Diagnosis not present

## 2020-02-28 DIAGNOSIS — Z591 Inadequate housing: Secondary | ICD-10-CM | POA: Diagnosis not present

## 2020-02-28 DIAGNOSIS — Z8631 Personal history of diabetic foot ulcer: Secondary | ICD-10-CM | POA: Diagnosis not present

## 2020-02-28 DIAGNOSIS — L89151 Pressure ulcer of sacral region, stage 1: Secondary | ICD-10-CM | POA: Diagnosis not present

## 2020-02-28 DIAGNOSIS — Z89612 Acquired absence of left leg above knee: Secondary | ICD-10-CM | POA: Diagnosis not present

## 2020-02-28 DIAGNOSIS — I509 Heart failure, unspecified: Secondary | ICD-10-CM | POA: Diagnosis not present

## 2020-02-28 DIAGNOSIS — Z8739 Personal history of other diseases of the musculoskeletal system and connective tissue: Secondary | ICD-10-CM | POA: Diagnosis not present

## 2020-02-28 DIAGNOSIS — M8580 Other specified disorders of bone density and structure, unspecified site: Secondary | ICD-10-CM | POA: Diagnosis not present

## 2020-02-28 DIAGNOSIS — E1151 Type 2 diabetes mellitus with diabetic peripheral angiopathy without gangrene: Secondary | ICD-10-CM | POA: Diagnosis not present

## 2020-02-28 DIAGNOSIS — Z602 Problems related to living alone: Secondary | ICD-10-CM | POA: Diagnosis not present

## 2020-02-28 DIAGNOSIS — Z4781 Encounter for orthopedic aftercare following surgical amputation: Secondary | ICD-10-CM | POA: Diagnosis not present

## 2020-02-28 DIAGNOSIS — G546 Phantom limb syndrome with pain: Secondary | ICD-10-CM | POA: Diagnosis not present

## 2020-02-28 DIAGNOSIS — Z7951 Long term (current) use of inhaled steroids: Secondary | ICD-10-CM | POA: Diagnosis not present

## 2020-02-28 DIAGNOSIS — Z4801 Encounter for change or removal of surgical wound dressing: Secondary | ICD-10-CM | POA: Diagnosis not present

## 2020-02-28 DIAGNOSIS — E440 Moderate protein-calorie malnutrition: Secondary | ICD-10-CM | POA: Diagnosis not present

## 2020-02-28 DIAGNOSIS — E1122 Type 2 diabetes mellitus with diabetic chronic kidney disease: Secondary | ICD-10-CM | POA: Diagnosis not present

## 2020-02-28 DIAGNOSIS — Z993 Dependence on wheelchair: Secondary | ICD-10-CM | POA: Diagnosis not present

## 2020-02-28 NOTE — Chronic Care Management (AMB) (Signed)
Care Management   Follow Up Note   02/28/2020 Name: [name] MRN: [id number] DOB: [birth year]  Referred by: Kari Rio, MD Reason for referral : Chronic Care Management (Home Health)   [name] is a 74 y.o. year old female who is a primary care patient of Kari Mems Delorse Limber, MD. The care management team was consulted for assistance with care management and care coordination needs.    Review of patient status, including review of consultants reports, relevant laboratory and other test results, and collaboration with appropriate care team members and the patient's provider was performed as part of comprehensive patient evaluation and provision of chronic care management services.    SDOH (Social Determinants of Health) assessments performed: No See Care Plan activities for detailed interventions related to [facility])     Advanced Directives: See Care Plan and Vynca application for related entries.   Goals Addressed              This Visit's Progress   .  " I am confused with having my legs amputatated and trying to cope" (pt-stated)        CARE PLAN ENTRY (see longtitudinal plan of care for additional care plan information)  Current Barriers:  . Chronic Disease Management support and education needs/crisis intervention in patient with bilateral below the knee amputation, Iron deficiency anemia, and community resources/urgent housing crisis  . Knowledge Deficits related to imbalanced nutrition less than body requirements as evidenced by Moderate protein-calorie malnutrition  Nurse Case Manager Clinical Goal(s):  Marland Kitchen Over the next 30 days, patient will verbalize understanding of plan  Interventions:  . Evaluation of current treatment plan related patient's adherence to plan as established by provider. . Provided education to patient re: nutrition and wound healing . Collaborated with SW regarding transportation . Discussed plans with patient for ongoing care  management follow up and provided patient with direct contact information for care management team . Patient stated that [facility] came to her home and she did not have hot water or heat.  They called APS and she had 30 days to fix the problem.The patient states that she has lived in the home and has not had hot water or heat for over 2 years and done well.  She has spoken to people to fix the problem and it will cost 6k.  She has her grandsons that are staying with her.  They provide food for her.  They are mainly eating out.  She states that she does have canned food in the home but nothing in the frig.  She states that she is confused with all of the people she has spoken with and can't remember names.  Social Worker [name] was on the phone by speaker in the room talking with the patient . Patient was given depends for incontinence,  a box of food and a scat applications for transportation. I will f/u with the patient on 11/15/19  . 02/28/20 . Spoke with the patient on the phone and she states that she is doing well  . PT finished last week.  They worked with her on arm strength, transferring from the bed to chair and from bed to the bedside commode. She said they also want to help with a motorized wheelchair. Advised the patient that she has to have an appointment with the doctor to be seen and transferred up to the front to make an appointment after the conversation. . The nurse is scheduled to come today.  She states her blood pressure is doing well.  She was unable to give me numbers .  Her blood sugars have remained low.  She states the nurse does not feel that she is a diabetic but she wants a glucometer to be able to check for herself.  She states that she has some areas on her bottom that look good that she keeps clean and dry and puts ointment on them.  She knows that she needs to turn not to rest in a certain position for long. . She states that she received moms meals on the 10th.  But she seems  to remember that the lady that called her telling her that as long as she received the meals on or before the 10th they should be ok but if after they would not be good.  I advised her if she was not sure to call them and ask.  She verbalized understanding.  . Patient Self Care Activities:  . Patient verbalizes understanding of plan  . Attends all scheduled provider appointments . Calls provider office for new concerns or questions . Unable to independently self manage community resources  Please see past updates related to this goal by clicking on the "Past Updates" button in the selected goal          The care management team will reach out to the patient again over the next 14 days.   [name] RN, BSN, [facility] Care Management Coordinator [place] Phone: [phone] Fax: [phone]

## 2020-02-28 NOTE — Patient Instructions (Signed)
Visit Information  Goals Addressed              This Visit's Progress   .  " I am confused with having my legs amputatated and trying to cope" (pt-stated)        CARE PLAN ENTRY (see longtitudinal plan of care for additional care plan information)  Current Barriers:  . Chronic Disease Management support and education needs/crisis intervention in patient with bilateral below the knee amputation, Iron deficiency anemia, and community resources/urgent housing crisis  . Knowledge Deficits related to imbalanced nutrition less than body requirements as evidenced by Moderate protein-calorie malnutrition  Nurse Case Manager Clinical Goal(s):  Marland Kitchen Over the next 30 days, patient will verbalize understanding of plan  Interventions:  . Evaluation of current treatment plan related patient's adherence to plan as established by provider. . Provided education to patient re: nutrition and wound healing . Collaborated with SW regarding transportation . Discussed plans with patient for ongoing care management follow up and provided patient with direct contact information for care management team . Patient stated that [facility] came to her home and she did not have hot water or heat.  They called APS and she had 30 days to fix the problem.The patient states that she has lived in the home and has not had hot water or heat for over 2 years and done well.  She has spoken to people to fix the problem and it will cost 6k.  She has her grandsons that are staying with her.  They provide food for her.  They are mainly eating out.  She states that she does have canned food in the home but nothing in the frig.  She states that she is confused with all of the people she has spoken with and can't remember names.  Social Worker [name] was on the phone by speaker in the room talking with the patient . Patient was given depends for incontinence,  a box of food and a scat applications for transportation. I will f/u with the  patient on 11/15/19  . 02/28/20 . Spoke with the patient on the phone and she states that she is doing well  . PT finished last week.  They worked with her on arm strength, transferring from the bed to chair and from bed to the bedside commode. She said they also want to help with a motorized wheelchair. Advised the patient that she has to have an appointment with the doctor to be seen and transferred up to the front to make an appointment after the conversation. . The nurse is scheduled to come today.  She states her blood pressure is doing well.  She was unable to give me numbers .  Her blood sugars have remained low.  She states the nurse does not feel that she is a diabetic but she wants a glucometer to be able to check for herself.  She states that she has some areas on her bottom that look good that she keeps clean and dry and puts ointment on them.  She knows that she needs to turn not to rest in a certain position for long. . She states that she received moms meals on the 10th.  But she seems to remember that the lady that called her telling her that as long as she received the meals on or before the 10th they should be ok but if after they would not be good.  I advised her if she was not sure  to call them and ask.  She verbalized understanding.  . Patient Self Care Activities:  . Patient verbalizes understanding of plan  . Attends all scheduled provider appointments . Calls provider office for new concerns or questions . Unable to independently self manage community resources  Please see past updates related to this goal by clicking on the "Past Updates" button in the selected goal         Ms. Baumgardner was given information about Care Management services today including:  1. Care Management services include personalized support from designated clinical staff supervised by her physician, including individualized plan of care and coordination with other care providers 2. 24/7 contact phone  numbers for assistance for urgent and routine care needs. 3. The patient may stop CCM services at any time (effective at the end of the month) by phone call to the office staff.  Patient agreed to services and verbal consent obtained.   The patient verbalized understanding of instructions provided today and declined a print copy of patient instruction materials.   The care management team will reach out to the patient again over the next 14 days.   [name] RN, BSN, [facility] Care Management Coordinator [place] Phone: [phone] Fax: [phone]

## 2020-03-02 DIAGNOSIS — S78111D Complete traumatic amputation at level between right hip and knee, subsequent encounter: Secondary | ICD-10-CM | POA: Diagnosis not present

## 2020-03-02 DIAGNOSIS — M6281 Muscle weakness (generalized): Secondary | ICD-10-CM | POA: Diagnosis not present

## 2020-03-02 DIAGNOSIS — I5032 Chronic diastolic (congestive) heart failure: Secondary | ICD-10-CM | POA: Diagnosis not present

## 2020-03-02 DIAGNOSIS — S78112D Complete traumatic amputation at level between left hip and knee, subsequent encounter: Secondary | ICD-10-CM | POA: Diagnosis not present

## 2020-03-08 ENCOUNTER — Other Ambulatory Visit: Payer: Self-pay

## 2020-03-08 NOTE — Patient Outreach (Signed)
Aging Gracefully Program  03/08/2020  [name] Feb 21, 1946 [id number]  [facility] Evaluation Interviewer made contact with patient. Aging Gracefully initial referral scheduled for 03/15/20 at 44:62 am.  [place] Management Assistant [phone]

## 2020-03-14 ENCOUNTER — Other Ambulatory Visit: Payer: Self-pay

## 2020-03-14 ENCOUNTER — Ambulatory Visit: Payer: Medicare Other

## 2020-03-14 NOTE — Chronic Care Management (AMB) (Signed)
Care Management   Follow Up Note   03/14/2020 Name: [name] MRN: [id number] DOB: 12/25/1945  Referred by: Kari Rio, MD Reason for referral : Chronic Care Management (RNCM F/U [facility] Ramp )   [name] is a 74 y.o. year old female who is a primary care patient of Kari Mems Delorse Limber, MD. The care management team was consulted for assistance with care management and care coordination needs.    Review of patient status, including review of consultants reports, relevant laboratory and other test results, and collaboration with appropriate care team members and the patient's provider was performed as part of comprehensive patient evaluation and provision of chronic care management services.    SDOH (Social Determinants of Health) assessments performed: No See Care Plan activities for detailed interventions related to [name])     Advanced Directives: See Care Plan and Vynca application for related entries.   Goals Addressed              This Visit's Progress   .  " I am confused with having my legs amputatated and trying to cope" (pt-stated)        CARE PLAN ENTRY (see longtitudinal plan of care for additional care plan information)  Current Barriers:  . Chronic Disease Management support and education needs/crisis intervention in patient with bilateral below the knee amputation, Iron deficiency anemia, and community resources/urgent housing crisis  . Knowledge Deficits related to imbalanced nutrition less than body requirements as evidenced by Moderate protein-calorie malnutrition  Nurse Case Manager Clinical Goal(s):  Marland Kitchen Over the next 30 days, patient will verbalize understanding of plan  Interventions:  . Evaluation of current treatment plan related patient's adherence to plan as established by provider. . Provided education to patient re: nutrition and wound healing . Collaborated with SW regarding transportation . Discussed plans with patient for ongoing  care management follow up and provided patient with direct contact information for care management team . Patient stated that [name] came to her home and she did not have hot water or heat.  They called APS and she had 30 days to fix the problem.The patient states that she has lived in the home and has not had hot water or heat for over 2 years and done well.  She has spoken to people to fix the problem and it will cost 6k.  She has her grandsons that are staying with her.  They provide food for her.  They are mainly eating out.  She states that she does have canned food in the home but nothing in the frig.  She states that she is confused with all of the people she has spoken with and can't remember names.  Social Worker [name] was on the phone by speaker in the room talking with the patient . Patient was given depends for incontinence,  a box of food and a scat applications for transportation. I will f/u with the patient on 11/15/19  . 727/21 . Spoke with the patient and she is doing well. She states that she spoke with [name] and has a phone call with her tomorrow interview for the Aging gracefully program. . She states that [name] and PT has stopped in the home.  She spoke with some one at [name] but could not tell me the name that said she need to continue at least 3-6 more weeks  but it should be with PT in home.  She states she did not understand what  they plan to do.  I advised the patient to call them back and ask them what there plan wa for her physical therapy.  She verbalized understanding. . She states that she is receiving [name] but just getting them once a month now. . She states that she has and appointment with her PCP on 03-23-20 to discuss her electric wheelchair. I advised her tht she only needed to deal with one company because things can become confusing when trying to order her chair. . Patient is advised to keep up with names and numbers of the people that she talks with if  she needs [place] to help follow up.  She verbalized understanding  . Patient Self Care Activities:  . Patient verbalizes understanding of plan  . Attends all scheduled provider appointments . Calls provider office for new concerns or questions . Unable to independently self manage community resources  Please see past updates related to this goal by clicking on the "Past Updates" button in the selected goal          The care management team will reach out to the patient again over the next 14 days.   Kari Arms RN, BSN, [facility] Care Management Coordinator [place] Phone: [phone] Fax: [phone]

## 2020-03-15 ENCOUNTER — Other Ambulatory Visit: Payer: Self-pay

## 2020-03-15 NOTE — Patient Outreach (Signed)
Aging Gracefully Program  03/15/2020  [name] [birth year] [id number]  [facility] Evaluation Interviewer made contact with patient. Aging Gracefully survey completed.   Interviewer will send referral to Tomasa Rand, RN and OT for follow up.   [place] Management Assistant [phone]

## 2020-03-16 NOTE — Telephone Encounter (Signed)
This encounter was created in error - please disregard.

## 2020-03-21 ENCOUNTER — Telehealth: Payer: Self-pay | Admitting: *Deleted

## 2020-03-21 NOTE — Chronic Care Management (AMB) (Signed)
   Care Management   Note  03/21/2020 Name: [name] MRN: [id number] DOB: 05-20-1946  [name] is a 74 y.o. year old female who is a primary care patient of [name], MD and is actively engaged with the care management team. I reached out to [name] by phone today to assist with re-scheduling a follow up visit with the RN Case Manager.  Follow up plan: Unsuccessful telephone outreach attempt made. A HIPPA compliant phone message was left for the patient providing contact information and requesting a return call. The care management team will reach out to the patient again over the next 7 days. If patient returns call to provider office, please advise to call [place] at [place], [place] Management  [place], [place] [address] Direct Dial: [email or web address] Website: [email or web address]

## 2020-03-23 ENCOUNTER — Encounter: Payer: Self-pay | Admitting: Family Medicine

## 2020-03-23 ENCOUNTER — Other Ambulatory Visit: Payer: Self-pay

## 2020-03-23 ENCOUNTER — Telehealth: Payer: Self-pay

## 2020-03-23 ENCOUNTER — Ambulatory Visit (INDEPENDENT_AMBULATORY_CARE_PROVIDER_SITE_OTHER): Payer: Medicare Other | Admitting: Family Medicine

## 2020-03-23 VITALS — BP 130/70 | HR 73

## 2020-03-23 DIAGNOSIS — Z7409 Other reduced mobility: Secondary | ICD-10-CM

## 2020-03-23 DIAGNOSIS — R059 Cough, unspecified: Secondary | ICD-10-CM

## 2020-03-23 DIAGNOSIS — E1152 Type 2 diabetes mellitus with diabetic peripheral angiopathy with gangrene: Secondary | ICD-10-CM

## 2020-03-23 DIAGNOSIS — R05 Cough: Secondary | ICD-10-CM

## 2020-03-23 DIAGNOSIS — Z1211 Encounter for screening for malignant neoplasm of colon: Secondary | ICD-10-CM

## 2020-03-23 LAB — POCT GLYCOSYLATED HEMOGLOBIN (HGB A1C): HbA1c, POC (prediabetic range): 5.8 % (ref 5.7–6.4)

## 2020-03-23 IMAGING — DX LEFT FOOT - COMPLETE 3+ VIEW
3 series · 3 of 3 positions shown · non-contrast
Comparison: 10/05/2018, 02/17/2018

CLINICAL DATA: Nonhealing ulcer

EXAM:
LEFT FOOT - COMPLETE 3+ VIEW

[foot ap]
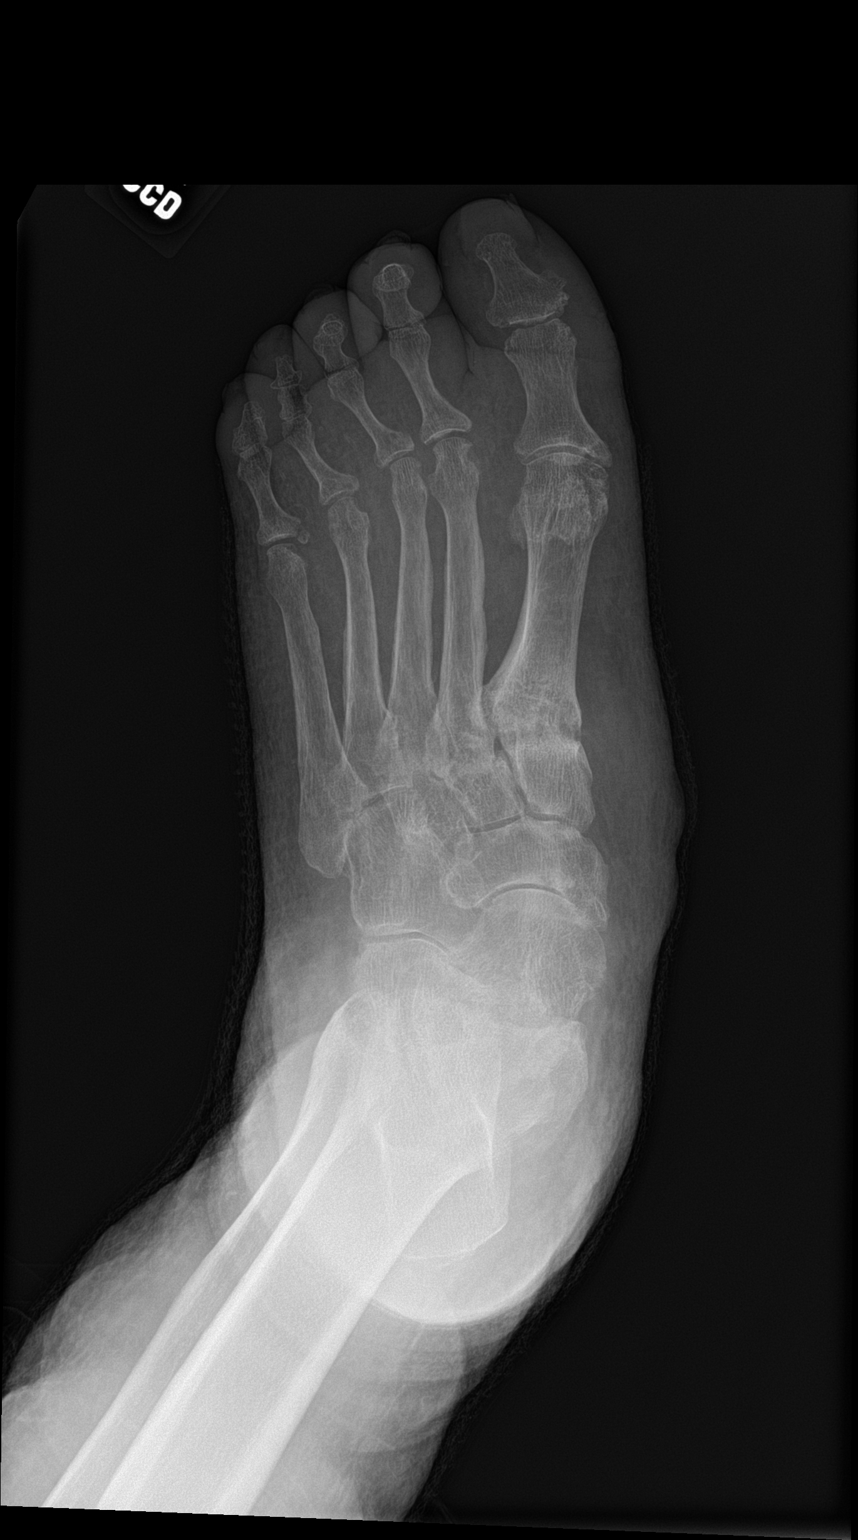

[foot obl]
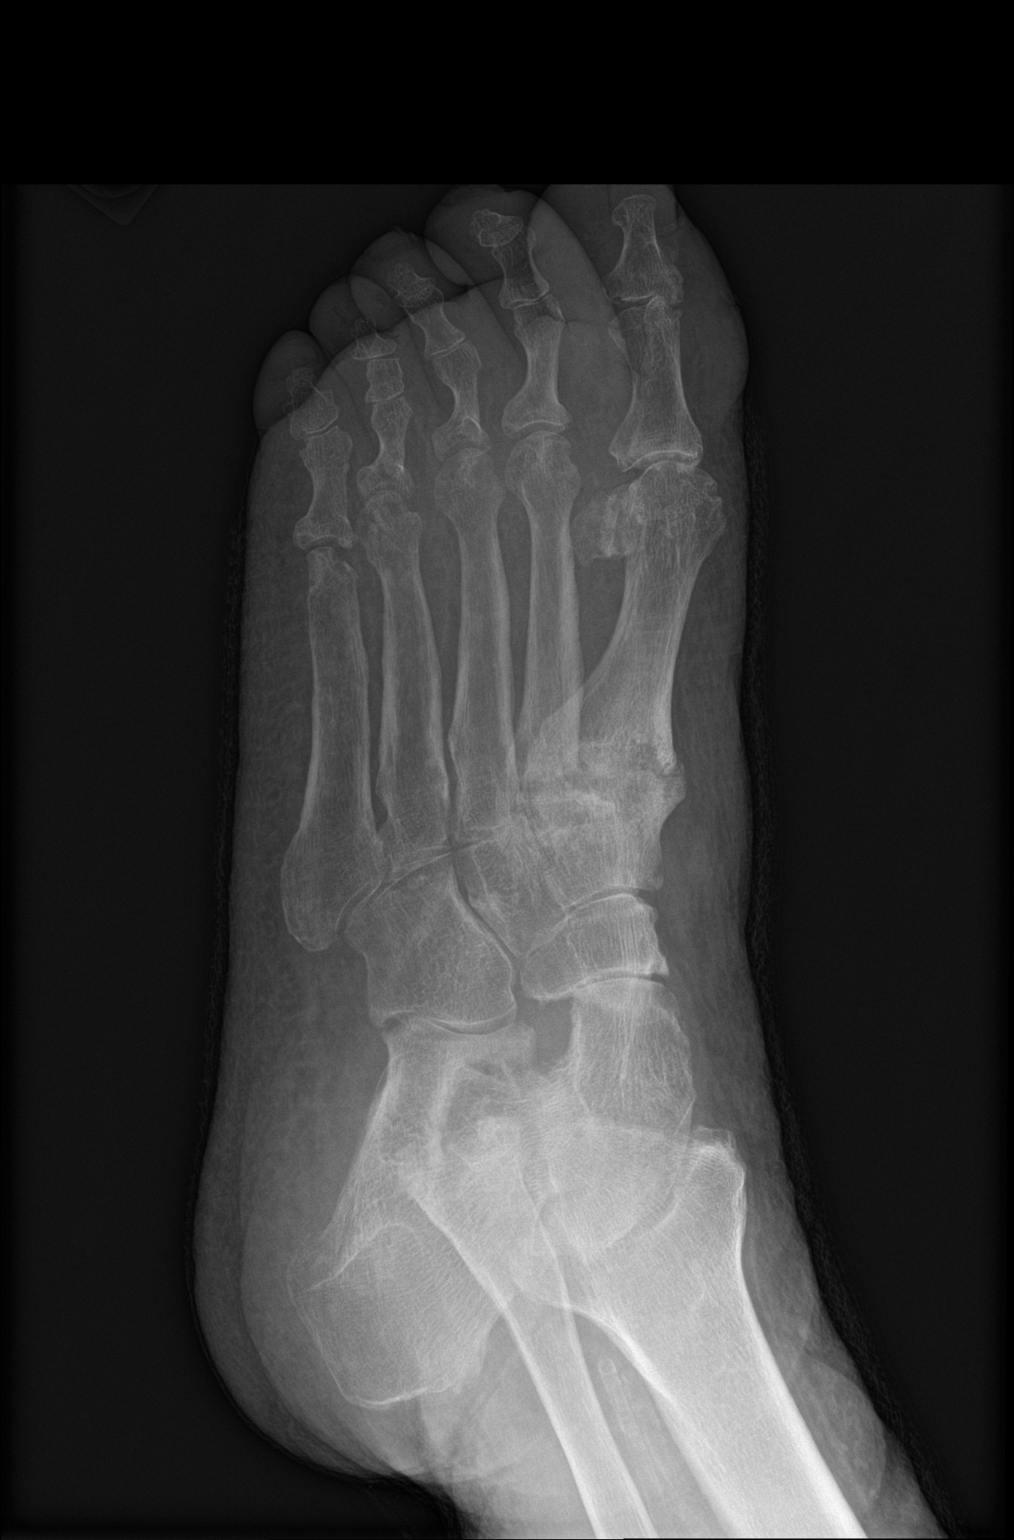

[foot lat]
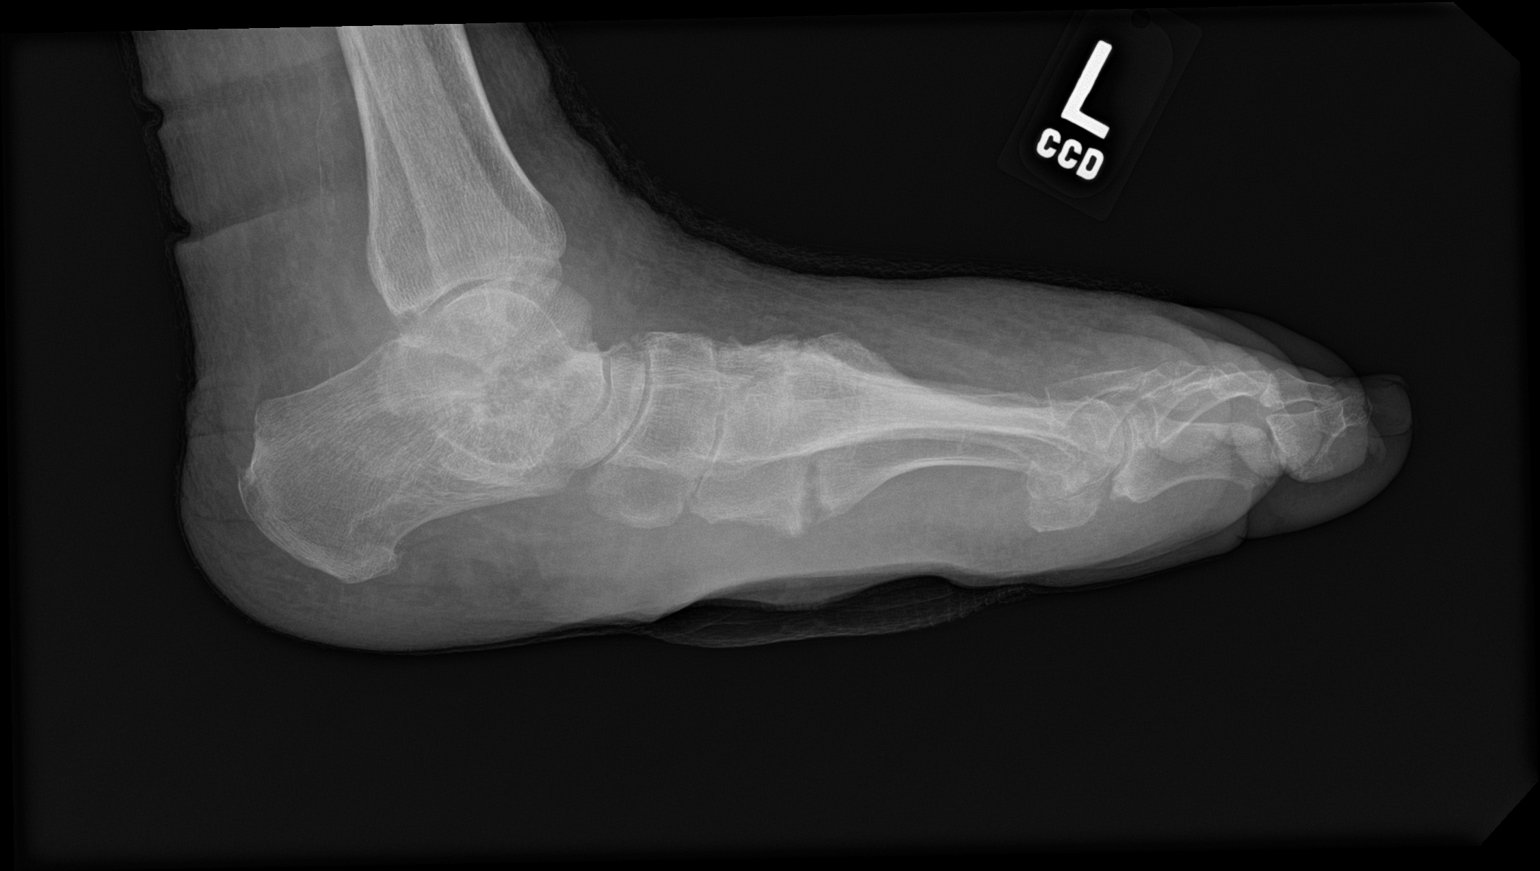

[3 of 3 positions shown; findings below may reference images not displayed]

FINDINGS: No fracture or malalignment. Large plantar ulcer. Stable chronic
erosive change at the first through fifth metatarsal heads and at
the first IP joint. Advanced arthropathy first and second TMT
joints.
IMPRESSION: 1. No acute osseous abnormality.
2. Large plantar ulcer
3. Stable chronic erosions as described above.

## 2020-03-23 MED ORDER — INCONTINENCE BRIEF MEDIUM MISC: 3 refills | Status: DC

## 2020-03-23 NOTE — Patient Instructions (Signed)
It was great to see you again today!  Sent in prescription for depends for you. Referring to GI for colonoscopy Go see physical therapy about wheelchair assessment  Getting CT scan of your chest  Follow up with me after the wheelchair assessment.  Be well, Dr. [name]

## 2020-03-23 NOTE — Telephone Encounter (Signed)
Called patient to remind her of scheduling a colonoscopy.  Patient states that she is aware and will need to arrange transportation.  Ozella Almond, [place]

## 2020-03-23 NOTE — Progress Notes (Signed)
2121 

## 2020-03-23 NOTE — Progress Notes (Signed)
    SUBJECTIVE:   HPI:   Patient presents for follow up.  Phantom Pain--Patient reports pain has significantly improved on gabapentin 100 mg TID and PRN 5% lidoderm patch.  Crackles--Patient has had chronic, intermittent, non-productive cough since bilateral AKA. She takes Lasix 40 mg daily. The most recent Chest x-ray last month showed chronic opacities with no evidence of acute abnormality. Patient reports requiring use of Proair HFA 108 90-base inhaler three times a week, occurring notably when pulling wheelchair; and endorses this frequency is stable for her. Denies fevers.    Desires Electric Wheelchair--Patient is s/p Bilateral AKA, and desires an IT trainer wheelchair for improved mobility.    Urinary Incontinence--Patient is wondering whether a home catheter would be adviseable given increased urination resulting from Lasix medication; in light of AKAs.   Health Maintenance--Patient missed previous appointment for ophthalmology exam and for colonoscopy due to scheduling issues (grandson was working).   PERTINENT  PMH / PSH: S/p Bilateral AKA, HTN, Hypothyroid, Rheumatoid arthritis, urge incontinence, T2DM in remission   OBJECTIVE:   BP 130/70   Pulse 73   SpO2 100%    Physical Exam Constitutional:      General: She is not in acute distress.    Appearance: She is well-groomed. She is not diaphoretic.  Cardiovascular:     Rate and Rhythm: Normal rate and regular rhythm.  Pulmonary:     Effort: Pulmonary effort is normal.     Comments: + B/L Diffuse crackles especially prominent in lung bases Musculoskeletal:     Right Lower Extremity: Right leg is amputated above knee.     Left Lower Extremity: Left leg is amputated above knee.  Neurological:     Mental Status: She is alert.     ASSESSMENT/PLAN:   Health maintenance -She will reschedule opthalmology exam -referral to GI for colonoscopy  Cough  Lung exam continues to show crackles. CXR was unremarkable. Given  chronic cough and known autoimmune illness (RA) will check CT chest to eval for ILD or pulmonary fibrosis.   Phantom pain Well controlled. Continue gabapentin 100 mg TID and PRN 5% lidoderm patch.  Electric Wheelchair Patient to see PT for wheelchair assessment. F/u after assessment.   Urinary Incontinence Advised patient against home catheter, emphasizing increased UTI risk. Prescribed incontinence briefs.   [place]   Patient seen along with MS3 student Romeo Apple. I personally evaluated this patient along with the student, and verified all aspects of the history, physical exam, and medical decision making as documented by the student. I agree with the student's documentation and have made all necessary edits.  Chrisandra Netters, MD  [place]

## 2020-03-27 ENCOUNTER — Encounter: Payer: Medicare Other | Admitting: Physical Therapy

## 2020-03-28 ENCOUNTER — Telehealth: Payer: Medicare Other

## 2020-03-31 NOTE — Chronic Care Management (AMB) (Signed)
  Care Management   Note  03/31/2020 Name: [name] MRN: [id number] DOB: [birth year]  [name] is a 74 y.o. year old female who is a primary care patient of [name], MD and is actively engaged with the care management team. I reached out to [name] by phone today to assist with re-scheduling a follow up visit with the RN Case Manager.  Follow up plan: Telephone appointment with care management team member scheduled for:04/11/2020  [place], [place] [facility]  [place], [place] [address] Direct Dial: [email or web address] Website: [email or web address]

## 2020-04-02 DIAGNOSIS — M6281 Muscle weakness (generalized): Secondary | ICD-10-CM | POA: Diagnosis not present

## 2020-04-02 DIAGNOSIS — S78111D Complete traumatic amputation at level between right hip and knee, subsequent encounter: Secondary | ICD-10-CM | POA: Diagnosis not present

## 2020-04-02 DIAGNOSIS — I5032 Chronic diastolic (congestive) heart failure: Secondary | ICD-10-CM | POA: Diagnosis not present

## 2020-04-02 DIAGNOSIS — S78112D Complete traumatic amputation at level between left hip and knee, subsequent encounter: Secondary | ICD-10-CM | POA: Diagnosis not present

## 2020-04-07 ENCOUNTER — Ambulatory Visit (HOSPITAL_COMMUNITY): Payer: Medicare Other | Attending: Family Medicine

## 2020-04-10 ENCOUNTER — Ambulatory Visit: Payer: Medicare Other

## 2020-04-10 ENCOUNTER — Other Ambulatory Visit: Payer: Self-pay

## 2020-04-10 NOTE — Chronic Care Management (AMB) (Signed)
Care Management   Follow Up Note   04/10/2020 Name: [name] MRN: [id number] DOB: 1946/03/23  Referred by: [name], MD Reason for referral : Appointment (Followup)   [name] is a 74 y.o. year old female who is a primary care patient of [name], MD. The care management team was consulted for assistance with care management and care coordination needs.    Review of patient status, including review of consultants reports, relevant laboratory and other test results, and collaboration with appropriate care team members and the patient's provider was performed as part of comprehensive patient evaluation and provision of chronic care management services.    SDOH (Social Determinants of Health) assessments performed: No See Care Plan activities for detailed interventions related to [facility])     Advanced Directives: See Care Plan and Vynca application for related entries.   Goals Addressed              This Visit's Progress   .  " I am confused with having my legs amputatated and trying to cope" (pt-stated)        CARE PLAN ENTRY (see longtitudinal plan of care for additional care plan information)  Current Barriers:  . Chronic Disease Management support and education needs/crisis intervention in patient with bilateral below the knee amputation, Iron deficiency anemia, and community resources/urgent housing crisis  . Knowledge Deficits related to imbalanced nutrition less than body requirements as evidenced by Moderate protein-calorie malnutrition  Nurse Case Manager Clinical Goal(s):  Marland Kitchen Over the next 30 days, patient will verbalize understanding of plan  Interventions:  . Evaluation of current treatment plan related patient's adherence to plan as established by provider. . Provided education to patient re: nutrition and wound healing . Collaborated with SW regarding transportation . Discussed plans with patient for ongoing care management follow up  and provided patient with direct contact information for care management team . Patient stated that [facility] came to her home and she did not have hot water or heat.  They called APS and she had 30 days to fix the problem.The patient states that she has lived in the home and has not had hot water or heat for over 2 years and done well.  She has spoken to people to fix the problem and it will cost 6k.  She has her grandsons that are staying with her.  They provide food for her.  They are mainly eating out.  She states that she does have canned food in the home but nothing in the frig.  She states that she is confused with all of the people she has spoken with and can't remember names.  Social Worker [name] was on the phone by speaker in the room talking with the patient . Patient was given depends for incontinence,  a box of food and a scat applications for transportation. I will f/u with the patient on 11/15/19  . 04/10/20 . Spoke with the patient and she is doing fair. She states she is waiting for someone to contact her regarding her motorized wheelchair. The patient stated that she is confused about the process.  RNCM talked with the PCP and she explained that the PCP has made a referral to [facility] for the patient to have an evaluation for her electric wheelchair.  She is currently in a wait list and the office will contact her.   . She also states that she has not heard anything from aging gracefully but has  given them all of the requested information.  I reassured her that they a reputable service that has done a lot of great things to help many people.  I advised her that I would call and check on the status for her with Aging gracefully. . Sent a message to [facility] and she included me on Email with others that are involved with the program to update me. .  .  .   . Patient Self Care Activities:  . Patient verbalizes understanding of plan  . Attends all scheduled provider appointments . Calls provider  office for new concerns or questions . Unable to independently self manage community resources  Please see past updates related to this goal by clicking on the "Past Updates" button in the selected goal          The care management team will reach out to the patient again over the next 14 days.   [name] RN, BSN, [facility] Care Management Coordinator [place] Phone: [phone] Fax: [phone]

## 2020-04-10 NOTE — Patient Instructions (Signed)
Visit Information  Goals Addressed              This Visit's Progress   .  " I am confused with having my legs amputatated and trying to cope" (pt-stated)        CARE PLAN ENTRY (see longtitudinal plan of care for additional care plan information)  Current Barriers:  . Chronic Disease Management support and education needs/crisis intervention in patient with bilateral below the knee amputation, Iron deficiency anemia, and community resources/urgent housing crisis  . Knowledge Deficits related to imbalanced nutrition less than body requirements as evidenced by Moderate protein-calorie malnutrition  Nurse Case Manager Clinical Goal(s):  Marland Kitchen Over the next 30 days, patient will verbalize understanding of plan  Interventions:  . Evaluation of current treatment plan related patient's adherence to plan as established by provider. . Provided education to patient re: nutrition and wound healing . Collaborated with SW regarding transportation . Discussed plans with patient for ongoing care management follow up and provided patient with direct contact information for care management team . Patient stated that [name] came to her home and she did not have hot water or heat.  They called APS and she had 30 days to fix the problem.The patient states that she has lived in the home and has not had hot water or heat for over 2 years and done well.  She has spoken to people to fix the problem and it will cost 6k.  She has her grandsons that are staying with her.  They provide food for her.  They are mainly eating out.  She states that she does have canned food in the home but nothing in the frig.  She states that she is confused with all of the people she has spoken with and can't remember names.  Social Worker [name] was on the phone by speaker in the room talking with the patient . Patient was given depends for incontinence,  a box of food and a scat applications for transportation. I will f/u with the  patient on 11/15/19  . 04/10/20 . Spoke with the patient and she is doing fair. She states she is waiting for someone to contact her regarding her motorized wheelchair. The patient stated that she is confused about the process.  RNCM talked with the PCP and she explained that the PCP has made a referral to [facility] for the patient to have an evaluation for her electric wheelchair.  She is currently in a wait list and the office will contact her.   . She also states that she has not heard anything from aging gracefully but has given them all of the requested information.  I reassured her that they a reputable service that has done a lot of great things to help many people.  I advised her that I would call and check on the status for her with Aging gracefully. . Sent a message to [facility] and she included me on Email with others that are involved with the program to update me. .  .  .   . Patient Self Care Activities:  . Patient verbalizes understanding of plan  . Attends all scheduled provider appointments . Calls provider office for new concerns or questions . Unable to independently self manage community resources  Please see past updates related to this goal by clicking on the "Past Updates" button in the selected goal         [name] was given information about Care Management  services today including:  1. Care Management services include personalized support from designated clinical staff supervised by her physician, including individualized plan of care and coordination with other care providers 2. 24/7 contact phone numbers for assistance for urgent and routine care needs. 3. The patient may stop CCM services at any time (effective at the end of the month) by phone call to the office staff.  Patient agreed to services and verbal consent obtained.   The patient verbalized understanding of instructions provided today and declined a print copy of patient instruction materials.   The care  management team will reach out to the patient again over the next 14 days.   [name] RN, BSN, [facility] Care Management Coordinator [place] Phone: [phone] Fax: [phone]

## 2020-04-11 ENCOUNTER — Telehealth: Payer: Medicare Other

## 2020-04-19 ENCOUNTER — Telehealth: Payer: Self-pay | Admitting: Family Medicine

## 2020-04-19 NOTE — Telephone Encounter (Signed)
Patient was referred to outpatient rehab for a wheelchair assessment. That is what is pending. Once I have that back I will sign the forms from [employer].  [name], MD

## 2020-04-19 NOTE — Telephone Encounter (Signed)
[employer] called earlier this morning pertaining to this patients need for a wheelchair. They have faxed request to [place] 2 times over the past 3 weeks and have not received it back. Told [employer] it looks like we have processed a medical records request for this and faxed it, but fax cannot be found.   I have placed this request in PCP's box due to the records from wheelchair discussion needing to be signed by [name] per [employer] request.

## 2020-04-25 ENCOUNTER — Ambulatory Visit: Payer: Medicare Other

## 2020-04-25 NOTE — Chronic Care Management (AMB) (Signed)
Care Management   Follow Up Note   04/25/2020 Name: [name]: [id number] DOB: 11-19-1945  Referred by: Kari Rio, MD Reason for referral : Chronic Care Management (Wheelchair Ramp)   [name] is a 74 y.o. year old female who is a primary care patient of Kari Mems Delorse Limber, MD. The care management team was consulted for assistance with care management and care coordination needs.    Review of patient status, including review of consultants reports, relevant laboratory and other test results, and collaboration with appropriate care team members and the patient's provider was performed as part of comprehensive patient evaluation and provision of chronic care management services.    SDOH (Social Determinants of Health) assessments performed: No See Care Plan activities for detailed interventions related to [facility])     Advanced Directives: See Care Plan and Vynca application for related entries.   Goals Addressed              This Visit's Progress   .  " I am confused with having my legs amputatated and trying to cope" (pt-stated)        CARE PLAN ENTRY (see longtitudinal plan of care for additional care plan information)  Current Barriers:  . Chronic Disease Management support and education needs/crisis intervention in patient with bilateral below the knee amputation, Iron deficiency anemia, and community resources/urgent housing crisis  . Knowledge Deficits related to imbalanced nutrition less than body requirements as evidenced by Moderate protein-calorie malnutrition  Nurse Case Manager Clinical Goal(s):  Marland Kitchen Over the next 30 days, patient will verbalize understanding of plan  Interventions:  . Evaluation of current treatment plan related patient's adherence to plan as established by provider. . Provided education to patient re: nutrition and wound healing . Collaborated with SW regarding transportation . Discussed plans with patient for ongoing care  management follow up and provided patient with direct contact information for care management team . Called the patient this morning to inform her that [place] with Aging grace[facility] called me to let me know that she was sick and could not make it to the appointment yesterday .  She tried to call [name] and was unable t get in touch with her.  After she has her Covid test she will call and schedule an appointment. . The patient states that she is doing well except that she still has bed sores that she is dealing with.  She is changing position at advised and using her cream but states that mattress that she has hurts her bottom and the mattress that she was given for the sores when put on her bed is to high for her to get in the bed.  I advised her to take her regular top mattress off and put the mattress on for the bed sores.  She verbalized understanding. . She also states that the cushion she has in her wheelchair hurts as well.  She gave me the number to [facility] supply  [phone].  I spoke with [name] and he states that she has a gel cushion they provided in March and would not be able to get another for 5 years.  The only other type is a Medical laboratory scientific officer.  She could have  the doctor write an order send notes also and they would have to get it approved by the insurance.   The physician will be notified.  Patient has been notified of the information. .   . Patient Self Care  Activities:  . Patient verbalizes understanding of plan  . Attends all scheduled provider appointments . Calls provider office for new concerns or questions . Unable to independently self manage community resources  Please see past updates related to this goal by clicking on the "Past Updates" button in the selected goal          The care management team will reach out to the patient again over the next 14 days.   Kari Arms RN, BSN, [facility] Care Management Coordinator [place] Phone:  [phone] Fax: [phone]

## 2020-04-25 NOTE — Patient Instructions (Signed)
Visit Information  Goals Addressed              This Visit's Progress   .  " I am confused with having my legs amputatated and trying to cope" (pt-stated)        CARE PLAN ENTRY (see longtitudinal plan of care for additional care plan information)  Current Barriers:  . Chronic Disease Management support and education needs/crisis intervention in patient with bilateral below the knee amputation, Iron deficiency anemia, and community resources/urgent housing crisis  . Knowledge Deficits related to imbalanced nutrition less than body requirements as evidenced by Moderate protein-calorie malnutrition  Nurse Case Manager Clinical Goal(s):  Marland Kitchen Over the next 30 days, patient will verbalize understanding of plan  Interventions:  . Evaluation of current treatment plan related patient's adherence to plan as established by provider. . Provided education to patient re: nutrition and wound healing . Collaborated with SW regarding transportation . Discussed plans with patient for ongoing care management follow up and provided patient with direct contact information for care management team . Called the patient this morning to inform her that [place] with [facility] called me to let me know that she was sick and could not make it to the appointment yesterday .  She tried to call [name] and was unable t get in touch with her.  After she has her Covid test she will call and schedule an appointment. . The patient states that she is doing well except that she still has bed sores that she is dealing with.  She is changing position at advised and using her cream but states that mattress that she has hurts her bottom and the mattress that she was given for the sores when put on her bed is to high for her to get in the bed.  I advised her to take her regular top mattress off and put the mattress on for the bed sores.  She verbalized understanding. . She also states that the cushion she has in her  wheelchair hurts as well.  She gave me the number to [facility] supply  [phone].  I spoke with [name] and he states that she has a gel cushion they provided in March and would not be able to get another for 5 years.  The only other type is a Medical laboratory scientific officer.  She could have  the doctor write an order send notes also and they would have to get it approved by the insurance.   The physician will be notified.  Patient has been notified of the information. .   . Patient Self Care Activities:  . Patient verbalizes understanding of plan  . Attends all scheduled provider appointments . Calls provider office for new concerns or questions . Unable to independently self manage community resources  Please see past updates related to this goal by clicking on the "Past Updates" button in the selected goal         [name] was given information about Care Management services today including:  1. Care Management services include personalized support from designated clinical staff supervised by her physician, including individualized plan of care and coordination with other care providers 2. 24/7 contact phone numbers for assistance for urgent and routine care needs. 3. The patient may stop CCM services at any time (effective at the end of the month) by phone call to the office staff.  Patient agreed to services and verbal consent obtained.   The patient verbalized understanding of  instructions provided today and declined a print copy of patient instruction materials.   The care management team will reach out to the patient again over the next 14 days.   [name] RN, BSN, [facility] Care Management Coordinator [place] Phone: [phone] Fax: [phone]

## 2020-04-26 ENCOUNTER — Telehealth: Payer: Medicare Other

## 2020-05-03 DIAGNOSIS — M6281 Muscle weakness (generalized): Secondary | ICD-10-CM | POA: Diagnosis not present

## 2020-05-03 DIAGNOSIS — S78112D Complete traumatic amputation at level between left hip and knee, subsequent encounter: Secondary | ICD-10-CM | POA: Diagnosis not present

## 2020-05-03 DIAGNOSIS — S78111D Complete traumatic amputation at level between right hip and knee, subsequent encounter: Secondary | ICD-10-CM | POA: Diagnosis not present

## 2020-05-03 DIAGNOSIS — I5032 Chronic diastolic (congestive) heart failure: Secondary | ICD-10-CM | POA: Diagnosis not present

## 2020-05-09 ENCOUNTER — Ambulatory Visit: Payer: Medicare Other

## 2020-05-09 NOTE — Chronic Care Management (AMB) (Signed)
  Care Management   Follow Up Note   05/09/2020 Name: RONNELL MAKAREWICZ MRN: [id number] DOB: [birth year]  Referred by: [name], MD Reason for referral : Appointment (Follow up)   Ravin BAYLEY YARBOROUGH is a 74 y.o. year old female who is a primary care patient of [name], MD. The care management team was consulted for assistance with care management and care coordination needs.    Review of patient status, including review of consultants reports, relevant laboratory and other test results, and collaboration with appropriate care team members and the patient's provider was performed as part of comprehensive patient evaluation and provision of chronic care management services.    SDOH (Social Determinants of Health) assessments performed: No See Care Plan activities for detailed interventions related to [facility])     Advanced Directives: See Care Plan and Vynca application for related entries.   Goals Addressed              This Visit's Progress   .  " I am confused with having my legs amputatated and trying to cope" (pt-stated)        CARE PLAN ENTRY (see longtitudinal plan of care for additional care plan information)  Current Barriers:  . Chronic Disease Management support and education needs/crisis intervention in patient with bilateral below the knee amputation, Iron deficiency anemia, and community resources/urgent housing crisis  . Knowledge Deficits related to imbalanced nutrition less than body requirements as evidenced by Moderate protein-calorie malnutrition  Nurse Case Manager Clinical Goal(s):  Marland Kitchen Over the next 30 days, patient will verbalize understanding of plan  Interventions:  . Evaluation of current treatment plan related patient's adherence to plan as established by provider. . Provided education to patient re: nutrition and wound healing . Collaborated with SW regarding transportation . Discussed plans with patient for ongoing care management follow up  and provided patient with direct contact information for care management team . Called the patient this morning She states that she is doing well.  She has not had a call from [place] [facility] to reschedule her visit.  RNCM called and left a message for Curtis Sites to reschedule the appointment for Mrs Luckman. . She states that she is still waiting to hear from someone to call her regarding the electric wheelchair.  She seems to be confused about who she has been in touch with.  I have asked her to write down names and numbers so I can help call the people that she has been in contact with. . I also explained to her again about the cushion in her seat .  I asked her to take the pillow from under the cushion and put it on top of the cushion to see if  that may help feel better.  . Patient Self Care Activities:  . Patient verbalizes understanding of plan  . Attends all scheduled provider appointments . Calls provider office for new concerns or questions . Unable to independently self manage community resources  Please see past updates related to this goal by clicking on the "Past Updates" button in the selected goal          The care management team will reach out to the patient again over the next 10 days.   [name] RN, BSN, [facility] Care Management Coordinator [place] Phone: [phone] Fax: [phone]

## 2020-05-09 NOTE — Patient Instructions (Signed)
Visit Information  Goals Addressed              This Visit's Progress   .  " I am confused with having my legs amputatated and trying to cope" (pt-stated)        CARE PLAN ENTRY (see longtitudinal plan of care for additional care plan information)  Current Barriers:  . Chronic Disease Management support and education needs/crisis intervention in patient with bilateral below the knee amputation, Iron deficiency anemia, and community resources/urgent housing crisis  . Knowledge Deficits related to imbalanced nutrition less than body requirements as evidenced by Moderate protein-calorie malnutrition  Nurse Case Manager Clinical Goal(s):  Marland Kitchen Over the next 30 days, patient will verbalize understanding of plan  Interventions:  . Evaluation of current treatment plan related patient's adherence to plan as established by provider. . Provided education to patient re: nutrition and wound healing . Collaborated with SW regarding transportation . Discussed plans with patient for ongoing care management follow up and provided patient with direct contact information for care management team . Called the patient this morning She states that she is doing well.  She has not had a call from [facility] OT to reschedule her visit.  RNCM called and left a message for Curtis Sites to reschedule the appointment for Mrs Kreft. . She states that she is still waiting to hear from someone to call her regarding the electric wheelchair.  She seems to be confused about who she has been in touch with.  I have asked her to write down names and numbers so I can help call the people that she has been in contact with. . I also explained to her again about the cushion in her seat .  I asked her to take the pillow from under the cushion and put it on top of the cushion to see if  that may help feel better.  . Patient Self Care Activities:  . Patient verbalizes understanding of plan  . Attends all scheduled provider appointments . Calls  provider office for new concerns or questions . Unable to independently self manage community resources  Please see past updates related to this goal by clicking on the "Past Updates" button in the selected goal         Ms. Fendley was given information about Care Management services today including:  1. Care Management services include personalized support from designated clinical staff supervised by her physician, including individualized plan of care and coordination with other care providers 2. 24/7 contact phone numbers for assistance for urgent and routine care needs. 3. The patient may stop CCM services at any time (effective at the end of the month) by phone call to the office staff.  Patient agreed to services and verbal consent obtained.   The patient verbalized understanding of instructions provided today and declined a print copy of patient instruction materials.   The care management team will reach out to the patient again over the next 10 days.   [name] RN, BSN, [facility] Care Management Coordinator [place] Phone: [phone] Fax: [phone]

## 2020-05-17 ENCOUNTER — Telehealth: Payer: Medicare Other

## 2020-05-17 ENCOUNTER — Telehealth: Payer: Self-pay

## 2020-05-17 NOTE — Telephone Encounter (Signed)
  Care Management   Outreach Note  05/17/2020 Name: [name] MRN: [id number] DOB: 10/20/1945  Referred by: [name], MD Reason for referral : Appointment (Wheelchair f/u)   An unsuccessful telephone outreach was attempted today. The patient was referred to the case management team for assistance with care management and care coordination.   Unable to leave a message mailbox full. Follow Up Plan: The care management team will reach out to the patient again over the next 7-14 days.   [name] RN, BSN, [facility] Care Management Coordinator [place] Phone: [phone] Fax: [phone]

## 2020-05-23 ENCOUNTER — Ambulatory Visit: Payer: Medicare Other

## 2020-05-29 NOTE — Chronic Care Management (AMB) (Signed)
Care Management   Follow Up Note   05/29/2020 Late Entry Name: [name] MRN: [id number] DOB: 1946-04-29  Referred by: Kari Rio, MD Reason for referral : Appointment (Follow up visit fro Wheel chair appointment)   [name] is a 74 y.o. year old female who is a primary care patient of Kari Mems Delorse Limber, MD. The care management team was consulted for assistance with care management and care coordination needs.    Review of patient status, including review of consultants reports, relevant laboratory and other test results, and collaboration with appropriate care team members and the patient's provider was performed as part of comprehensive patient evaluation and provision of chronic care management services.    SDOH (Social Determinants of Health) assessments performed: No See Care Plan activities for detailed interventions related to [facility])     Advanced Directives: See Care Plan and Vynca application for related entries.   Goals Addressed              This Visit's Progress   .  " I am confused with having my legs amputatated and trying to cope" (pt-stated)        CARE PLAN ENTRY (see longtitudinal plan of care for additional care plan information)  Current Barriers:  . Chronic Disease Management support and education needs/crisis intervention in patient with bilateral below the knee amputation, Iron deficiency anemia, and community resources/urgent housing crisis  . Knowledge Deficits related to imbalanced nutrition less than body requirements as evidenced by Moderate protein-calorie malnutrition  Nurse Case Manager Clinical Goal(s):  Marland Kitchen Over the next 30 days, patient will verbalize understanding of plan  Interventions:  . Evaluation of current treatment plan related patient's adherence to plan as established by provider. . Provided education to patient re: nutrition and wound healing . Collaborated with SW regarding transportation . Discussed plans with  patient for ongoing care management follow up and provided patient with direct contact information for care management team . Called the patient this morning She states that she is doing well.  She has not had a call from [facility] OT to reschedule her visit.  RNCM call Received a message from [name] and she will be taking over [name]'s case .  I had a 3-way call so she could talk with [name] and set up an appointment time to go out and see. [name] is OT with [facility] [phone] . She states that she is still waiting to hear from someone to call her regarding the electric wheelchair.  She seems to be confused about who she has been in touch with.  She wrote down a number this time and I called and spoke with [name] at the neurology center [phone].  She stated that they just need for [name] to pick a company that she wants to use for her service for the wheelchair.  Once she does that they will call her for an appointment.  That will not happen till next month she is on a waiting list.    I have given all of this information to [name] and told her to call me if she has any other question. She verbalized understanding.   . Patient Self Care Activities:  . Patient verbalizes understanding of plan  . Attends all scheduled provider appointments . Calls provider office for new concerns or questions . Unable to independently self manage community resources  Please see past updates related to this goal by clicking on the "Past Updates"  button in the selected goal          The care management team will reach out to the patient again over the next 14 days.   Kari Arms RN, BSN, [facility] Care Management Coordinator [place] Phone: [phone] Fax: [phone]

## 2020-05-29 NOTE — Patient Instructions (Signed)
Visit Information  Goals Addressed              This Visit's Progress   .  " I am confused with having my legs amputatated and trying to cope" (pt-stated)        CARE PLAN ENTRY (see longtitudinal plan of care for additional care plan information)  Current Barriers:  . Chronic Disease Management support and education needs/crisis intervention in patient with bilateral below the knee amputation, Iron deficiency anemia, and community resources/urgent housing crisis  . Knowledge Deficits related to imbalanced nutrition less than body requirements as evidenced by Moderate protein-calorie malnutrition  Nurse Case Manager Clinical Goal(s):  [name] Over the next 30 days, patient will verbalize understanding of plan  Interventions:  . Evaluation of current treatment plan related patient's adherence to plan as established by provider. . Provided education to patient re: nutrition and wound healing . Collaborated with SW regarding transportation . Discussed plans with patient for ongoing care management follow up and provided patient with direct contact information for care management team . Called the patient this morning She states that she is doing well.  She has not had a call from [place] OT to reschedule her visit.  RNCM call Received a message from [name] and she will be taking over [name]'s case .  I had a 3-way call so she could talk with [name] and set up an appointment time to go out and see. [name] is OT with [facility] [phone] . She states that she is still waiting to hear from someone to call her regarding the electric wheelchair.  She seems to be confused about who she has been in touch with.  She wrote down a number this time and I called and spoke with [name] at the neurology center [phone].  She stated that they just need for [name] to pick a company that she wants to use for her service for the wheelchair.  Once she does that they will call her for an  appointment.  That will not happen till next month she is on a waiting list.    I have given all of this information to [name] and told her to call me if she has any other question. She verbalized understanding.   . Patient Self Care Activities:  . Patient verbalizes understanding of plan  . Attends all scheduled provider appointments . Calls provider office for new concerns or questions . Unable to independently self manage community resources  Please see past updates related to this goal by clicking on the "Past Updates" button in the selected goal         [name] was given information about Care Management services today including:  1. Care Management services include personalized support from designated clinical staff supervised by her physician, including individualized plan of care and coordination with other care providers 2. 24/7 contact phone numbers for assistance for urgent and routine care needs. 3. The patient may stop CCM services at any time (effective at the end of the month) by phone call to the office staff.  Patient agreed to services and verbal consent obtained.   The patient verbalized understanding of instructions provided today and declined a print copy of patient instruction materials.   The care management team will reach out to the patient again over the next 14 days.   Kari Arms RN, BSN, [facility] Care Management Coordinator [place] Phone: [phone] Fax: [phone]

## 2020-05-31 ENCOUNTER — Other Ambulatory Visit: Payer: Self-pay

## 2020-05-31 ENCOUNTER — Other Ambulatory Visit: Payer: Self-pay | Admitting: Occupational Therapy

## 2020-05-31 ENCOUNTER — Ambulatory Visit: Payer: Medicare Other

## 2020-05-31 NOTE — Patient Outreach (Signed)
[facility]  OT Initial Visit  05/31/2020  [name] 08/03/1946 [id number]  Visit:  1- Initial Visit  Start Time:  1100 End Time:  8182 Total Minutes:  105  CCAP: Typical Daily Routine: Typical Daily Routine:: Gets up, changes Depends in bed or on toilet, washes up then or later, eats breakfast, watches TV, lays in bed, wheels around in her W/C--looks out door. What Types Of Care Problems Are You Having Throughout The Day?: Can only sponge bathe but its not the easiest, can manage getting my food fixed but not the easiest, can't get out of my house on my own What Kind Of Help Do You Receive?: Grandsons take her to some appointments, they pick up food for her Do You Think You Need Other Types Of Help?: cooked meals, help with bathing and dressing, aid for around the house What Do You Think Would Make Everyday Life Easier For You?: A ramp and side walk where I can get out of my house by myself, a functional bathroom wheelchair accessible, new floor What Is A Good Day Like?: When I get to get out of the house What Is A Bad Day Like?: Today, because I feel night before last and could not get help for 7 hours. I laid on the cold hard floor all that time Do You Have Time For Yourself?: too much Patient Reported Equipment: Patient Reported Equipment Currently Used: Bedside Commode, Reacher, Rollator, Side Rails On Bed, Wheelchair Functional Mobility-Walking Indoors/Getting Around [employer]:  uses a wheelchair Functional Mobility-Walk A Block: Walk A Block: Unable To Do Functional Mobility-Maintain Balance While Showering: Maintaining Balance While Showering: N/A Other Comments:: sponge baths W/C or bed Functional Mobility-Stooping, Crouching, Kneeling To Retreive Item: Stooping, Crouching, or Kneeling To Retrieve Item: Unable To Do Functional Mobility-Bending From Standing Position To Pick Up Clothing Off The Floor: Bending Over From Standing Position To Pick Up Clothing  Off The Floor: Unable To Do Functional Mobility-Reaching For Items Above Shoulder Level: Reaching For Items Above Shoulder Level: Moderate Difficulty Other Comments:: Can reach up, but not sustain Functional Mobility-Climb 1 Flight Of Stairs: Climb 1 Flight Of Stairs: Unable To Do Functional Mobility-Move In And Out Of Chair: Move In and Out Of A Chair: Unable To Do Other Comments:: family lifts her in and out of cars, has tried to use transfer board but does not work Nurse, learning disability In And Out Of Bed: Move In and Out Of Bed: A Little Difficulty Other Comments:: manages with bedrail, and anterior/posterior transfers with increased time Functional Mobility-Move In And Out Of Bath/Shower: Move In And Out Of A Bath/Shower: N/A Other Comments:: sponge baths Functional Mobility-Get On And Off Toilet: Getting Up From The Floor: Unable To Do Functional Mobility-Into And Out Of Car, Not Including Driving: Into  And Out Of Car, Not Including Driving: Unable To Do Other Comments:: family lifts her in and out Functional Mobility-Other Mobility Difficulty:      Activities of Daily Living-Bathing/Showering: ADL-Bathing/Showering: A Little Difficulty Other Comments:: sponge baths Activities of Daily Living-Personal Hygiene and Grooming: Personal Hygiene and Grooming: No Difficulty Activities of Daily Living-Toilet Hygiene: Toilet Hygiene: A Little Difficulty Other Comments:: manages on 3n1 Activities of Daily Living-Put On And Take Off Undergarments (Incl. Fasteners): Put On And Take Off Undergarments (Incl. Fasteners): N/A Other Comments:: doesn't wear Activities of Daily Living-Put On And Take Off Shirt/Dress/Coat (Incl. Fasteners): Put On And Take Off Shirt/Dress/Coat (Incl. Fasteners): A Little Difficulty Other Comments:: has to take her time  due to shoulders Activities of Daily Living-Put On And Take Off Socks And Shoes: Put On And Take Off Socks And  Shoes: N/A Activities of  Daily Living-Feed Self: Feed Self: N/A Activities of Daily Living-Rest And Sleep: Rest and Sleep: No Difficulty Activities of Daily Living-Sexual Activity: Sexual  Activity: N/A  Instrumental Activities of Daily Living-Light Homemaking (Laundry, Straightening Up, Vacuuming):  Do Light Homemaking (Laundry, Straightening Up, Vacuuming): A Lot of Difficulty Other Comments:: sweeps from W/C level, straigtens up from w/c level Instrumental Activities of Daily Living-Making A Bed: Making a Bed: Unable To Do Other Comments:: grandson does this for her Instrumental Activities of Daily Living-Washing Dishes By Hand While Standing At The Sink: Washing Dishes By Hand While Standing At The Sink: N/A Other Comments:: uses throw away plates/utensils mostly Instrumental Activities of Daily Living-Grocery Shopping: Do Grocery Shopping: Unable To Do Other Comments:: grandsons do Instrumental Activities of Daily Living-Use Telephone: Use Telephone: No Difficulty Instrumental Activities of Daily Living-Financial Management: Financial Management: No Difficulty Instrumental Activities of Daily Living-Medications: Take Medications: No Difficulty Instrumental Activities of Daily Living-Health Management And Maintenance: Health Management & Maintenance: A Little Difficulty Other Comments:: getting to appointements sometimes due to transportation issues Instrumental Activities of Daily Living-Meal Preparation and Clean-Up: Meal Preparation and Clean-Up: A Little Difficulty Other Comments:: W/C level Instrumental Activities of Daily Living-Provide Care For Others/Pets: Care For Others/Pets: N/A Instrumental Activities of Daily Living-Take Part In Organized Social Activities: Take Part In Organized Social Activities: Unable To Do Instrumental Activities of Daily Living-Leisure Participation: Leisure Participation: Unable To Do Instrumental Activities of Daily Living-Employment/Volunteer  Activities: Employment/Volunteer Activities: Unable To Do   Readiness To Change Score:  Readiness to Change Score: 10   Goals: Goals Addressed            This Visit's Progress   . Patient Stated       She wants to be able to get in and out of her house on her own safely (new flooring (vinyl plank)on the inside of house, ramp and sidewalk would help with this)    . Patient Stated       She would like to have a functional bathroom she can access by wheelchair. (wider door at bathroom, working toilet--regular height round, walk or perhaps roll in shower, grab bars, hand held shower)    . Patient Stated       She would like to be able to do her own laundry at home (instead of her grandsons having to go to laundry mat for her). Need washer and dryer looked at to see what is needed to make this happen.    . Patient Stated       She would like to feel safe bending forward to pick things up from her wheelchair (seat belt, reacher, dressing stick       Post Clinical Reasoning: Clinician View Of Client Situation:: [name] had a double AKA Feb 2021, went to rehab and then home in a wheelchair. Prior to returning home she reports a family member felt like she would be better off if her parquet floors where removed since she would be in a wheelchair. So now her floors in living room (which is now her bedroom), kitchen, hallway, and bathroom are concrete slab. Due to the parquet being removed this leaves a gap at her front door bottom threshold where insects can enter the house (she has roach traps set around--I did not see any roaches, but I did see several flies).  There is alot of growth in the yard which also concerns me for snakes (which could get under this door as well). She has a microwave and an air fryer she uses to cook in the living room. Her stove does not work--even if it did I would be afraid for her to use it due to her being in a W/C (back controls to stove) and she has trouble  keeping her arms at or above shoulder height for any period of time. She also has a small refrigerator/freezer in the living room (due to her standard size refrigerator is hard for her to open and close (I tried and it is)). She cannot get through her bathroom door with her wheelchair so she takes sponge baths in her W/C or bed by using a big bowl she puts on her rollator that she then fills at the sink cup buy cup and then pushes rollator as she pushes her W/C into the living room to wash up if going to do from bed or stays in kitchen and baths from W/C. She also toilets on a 3n1 next to her bed (again because she cannot get into her bathroom to use the toilet). She dresses in the bed or W/C with most of the time wearing gowns and Depends. She does anterior/posterior transfers to/from surfaces. She eats mostly out of styrofoam take out containers that she uses to heat up food in the microwave in and then places them on her lap (states she puts a towel on her lap before placing the food tray on it and takes the food container to a table that her tv is on, She uses mainly pop top cans (which are hard for her to get started due to OA in her hands-uses pointed scissors to start them--unsafe); also uses pointed scissors to take her Depends off of her while she is in W/C or on toilet-- (again unsafe). She has ramp at the front door that is coming away from house and is giving in when stepped on as well--she can ger herself out of the house due to this, nor can she reach her front door knob from her W/C. Given all the obstacles she has had to overcome she does well for her environment. Client View Of His/Her Situation:: Overall she is thankful to be in her house but wishes they had not taken her parquet floors up, wishes she could get in and out of her house by herself, wishes she had a functional bathroom she could get into, wishes she had a working washer and dryer in her house.

## 2020-05-31 NOTE — Patient Instructions (Signed)
Goals Addressed            This Visit's Progress    Patient Stated       She wants to be able to get in and out of her house on her own safely (new flooring (vinyl plank)on the inside of house, ramp and sidewalk would help with this)     Patient Stated       She would like to have a functional bathroom she can access by wheelchair. (wider door at bathroom, working toilet--regular height round, walk or perhaps roll in shower, grab bars, hand held shower)     Patient Stated       She would like to be able to do her own laundry at home (instead of her grandsons having to go to laundry mat for her). Need washer and dryer looked at to see what is needed to make this happen.     Patient Stated       She would like to feel safe bending forward to pick things up from her wheelchair (seat belt, reacher, dressing stick

## 2020-05-31 NOTE — Chronic Care Management (AMB) (Signed)
   RNCM Care Management Collaboration 05/31/2020 Name: [name] MRN: [id number] DOB: Jul 28, 1946   [name] is a 74 y.o. year old female who sees [name], Delorse Limber, MD for primary care. RNCM was consulted by Wyline Mood to assistance patient with  Care Coordination.    Assessment: Wyline Mood with [facility] was at the home of the patient states that Monday at 12 midnight [name] fell out of bed and hit the floor which is made of concrete.  She used her medical alert and it did not work at the time per the patient until 5 am she finally received some help.  Per patient EMS came to the home and wanted to take her to the  hospital but she refused.  She did not want to go due to covid. She also states that she would not have a way to get back into her home.  She states that " I have pain in my right nub area"  The pain is a 10/10 when I move but no pain if I am still.  She has some pain if she coughs .  No shortness of breath.  [name] states that she is breathing fine.  [name] said that her grandson can bring her to an appointment in the mornings.  RNCM asked schedulers to make first available appointment here 06/02/20 920 am.  [name] will write the information down for the patient.    [name] was given information to contact Neurology center regarding wheelchair for Mrs Keller and she will call them regarding the dimensions in her home to make sure that her motorized wheelchair will be able to cut the corners in her home.  Intervention:  Review of patient status, including review of consultants reports, relevant laboratory and other test results, and collaboration with appropriate care team members and the patient's provider was performed as part of comprehensive patient evaluation and provision of chronic care management services.      Plan:  RNCM will follow up with the patient at the next scheduled interval.    [name] RN, BSN, [facility] Care Management Coordinator [place] Phone: [phone] Fax: [phone]

## 2020-06-01 ENCOUNTER — Ambulatory Visit: Payer: Medicare Other

## 2020-06-01 NOTE — Chronic Care Management (AMB) (Signed)
   RNCM Care Management Collaboration 06/01/2020 Name: ALISSA PHARR MRN: [id number] DOB: 17-Dec-1945   Kenady NEVAYAH FAUST is a 74 y.o. year old female who sees [name], Delorse Limber, MD for primary care. RNCM was consulted by Golden Circle from [facility] to assistance patient with  Care Coordination.    Assessment: Tye Maryland called RNCM after her visit with the patient yesterday she feels that the patient may benefit from another session with a nurse, physical therapy, and a bather.  She would also would like to see if we could help the patient with meals on wheel.  The patient has food in the home but she has to use a can opener to open it and she has a hard time with the arthritis in her hands.  Intervention: Patient was not interviewed or contacted during this encounter. Review of patient status, including review of consultants reports, relevant laboratory and other test results, and collaboration with appropriate care team members and the patient's provider was performed as part of comprehensive patient evaluation and provision of chronic care management services.      Plan:  1. RNCM collaborated with [employer] and she states that the patient is on a wait list for Meals on Wheels.  She will check on it for the patient.  Message sent 2. RNCM will send the PCP a message requesting Home health.  Message sent. 3. Golden Circle will be communicating with her team regarding help with the home.   [name] RN, BSN, [facility] Care Management Coordinator [place] Phone: [phone] Fax: [phone]

## 2020-06-02 ENCOUNTER — Ambulatory Visit: Payer: Medicare Other

## 2020-06-02 ENCOUNTER — Ambulatory Visit: Payer: Medicare Other | Admitting: Licensed Clinical Social Worker

## 2020-06-02 DIAGNOSIS — I5032 Chronic diastolic (congestive) heart failure: Secondary | ICD-10-CM | POA: Diagnosis not present

## 2020-06-02 DIAGNOSIS — M6281 Muscle weakness (generalized): Secondary | ICD-10-CM | POA: Diagnosis not present

## 2020-06-02 DIAGNOSIS — S78111D Complete traumatic amputation at level between right hip and knee, subsequent encounter: Secondary | ICD-10-CM | POA: Diagnosis not present

## 2020-06-02 DIAGNOSIS — Z789 Other specified health status: Secondary | ICD-10-CM

## 2020-06-02 DIAGNOSIS — S78112D Complete traumatic amputation at level between left hip and knee, subsequent encounter: Secondary | ICD-10-CM | POA: Diagnosis not present

## 2020-06-02 NOTE — Chronic Care Management (AMB) (Signed)
Care Management   Clinical Social Work Follow Up   06/02/2020 Name: [name] MRN: [id number] DOB: 02-21-1946 Referred by: [name], MD  Reason for referral : Care Coordination (collaboration )  [name] is a 74 y.o. year old female who is a primary care patient of [name], MD.  Reason for follow-up: assess for barriers and progress with prepared meal delivery .    Assessment: Patient continues to experience difficulty with preparing her own meals.  Unable to reach patient during this encounter however was able to collaborate with meals on wheels, mom's meals and CCM RN.   Plan: LCSW will continue collaboration on patient's behalf, wait for return call from meals on wheel and reach out to patient again in 7 to 10 days   Goals Addressed            This Visit's Progress   . meals on wheels   Not on track    [place] (see longitudinal plan of care for additional care plan information)  Current Barriers & Progress:  . Patient needs community resources for meals on wheels . Acknowledges deficits and needs support, education and care coordination in order to meet this unmet need  . Patient is unable to prepare meals and rely's on grandson to bring her fast food . on wait list for mobile meals effective April 13th  . Continues to receive Moms Meals with delivery once per month Clinical Goal(s) Over the next 120 days assist patient will food needs with pre cooked meals Interventions provided by LCSW:  . Collaborated with CCM RN for ongoing patient needs . Contacted Melissa with [facility] /[employer] for update on meal delivery extension options . Contacted meals on wheels for an update on wait list status  spoke to [name]( waiting on social worker to return call) Patient Self Care Activities & Deficits:  . Patient is unable to independently navigate community resource options without care coordination support  . Acknowledges deficits and is motivated to  resolve concern  Please see past updates related to this goal by clicking on the "Past Updates" button in the selected goal        Outpatient Encounter Medications as of 06/02/2020  Medication Sig  . acetaminophen (TYLENOL) 325 MG tablet Take 2 tablets (650 mg total) by mouth every 6 (six) hours.  . Blood Glucose Monitoring Suppl (ONETOUCH VERIO) w/Device KIT Check sugar 3 times per day  . feeding supplement, ENSURE ENLIVE, (ENSURE ENLIVE) LIQD Take 237 mLs by mouth 3 (three) times daily between meals.  . ferrous sulfate 324 MG TBEC Take 1 tablet (324 mg total) by mouth daily with breakfast.  . furosemide (LASIX) 40 MG tablet Take 1 tablet (40 mg total) by mouth daily.  Marland Kitchen gabapentin (NEURONTIN) 100 MG capsule Take 1 capsule (100 mg total) by mouth every 8 (eight) hours.  Marland Kitchen glucose blood (ONETOUCH VERIO) test strip Check blood sugar daily  . Incontinence Supply Disposable (INCONTINENCE BRIEF MEDIUM) MISC Wear as needed for incontinence  . Lancet Devices (MICROLET NEXT LANCING DEVICE) MISC 1 each by Does not apply route 3 (three) times daily. Check blood sugar 3 times daily  . levothyroxine (SYNTHROID) 150 MCG tablet Take 1 tablet (150 mcg total) by mouth daily.  Marland Kitchen lidocaine (LIDODERM) 5 % Apply one patch to each lower limbs for 12 hours a day as needed for phantom pain. Remove after 12 hours or as directed by MD  . Jonetta Speak LANCETS  33G MISC Check blood sugar once daily  . PROAIR HFA 108 (90 Base) MCG/ACT inhaler INHALE 2 PUFFS INTO THE LUNGS EVERY 6 HOURS AS NEEDED FOR WHEEZING OR SHORTNESS OF BREATH  . rosuvastatin (CRESTOR) 20 MG tablet Take 1 tablet (20 mg total) by mouth daily.   No facility-administered encounter medications on file as of 06/02/2020.   Review of patient status, including review of consultants reports, relevant laboratory and other test results, and collaboration with appropriate care team members and the patient's provider was performed as part of comprehensive  patient evaluation and provision of care management services.   [name], [place] / [place]   [phone] 4:17 PM

## 2020-06-06 ENCOUNTER — Ambulatory Visit: Payer: Medicare Other | Admitting: Licensed Clinical Social Worker

## 2020-06-06 DIAGNOSIS — Z7189 Other specified counseling: Secondary | ICD-10-CM

## 2020-06-06 DIAGNOSIS — Z789 Other specified health status: Secondary | ICD-10-CM

## 2020-06-06 NOTE — Chronic Care Management (AMB) (Signed)
Care Management   Clinical Social Work Follow Up   06/06/2020 Name: [name] MRN: [id number] DOB: 1946-05-05 Referred by: [name], MD  Reason for referral : Care Coordination (f/u)  [name] is a 74 y.o. year old female who is a primary care patient of [name], MD.  Reason for follow-up: assess for barriers and progress with meals on wheels and advance directive    Assessment: Patient continues to experience difficulty with meeting ADL's and has limited support in her home.   Current Barriers:  Lacks knowledge of community resources.  Recommendation: Patient may benefit from, and is in agreement to allow LCSW to make a referral to the PACE program.  Clinical Social Work Goal(s): Over the next  60 days, patient will connect and explore community support options Interventions provided by LCSW:   Assessment of needs, barriers, and progress    Provided patient with information about PACE program . Referral place to the PACE Advance Directive Status: See Care Plan for related entries. ;  SDOH (Social Determinants of Health) assessments performed: No new needs identified   Plan: Patient would like continued follow-up. LCSW will f/u with in 2 to 3 weeks  Goals Addressed            This Visit's Progress   . advance directives   Not on track    [place] (see longitudinal plan of care for additional care plan information)  Current Barriers & Progress:  . Patient  does not have an Forensic scientist . Acknowledges deficits, education and support in order to complete this document . Limited education about the importance of naming a healthcare power of attorney . Patient has advance directive packet but has not completed Clinical Social Work Goal(s):  Marland Kitchen Over the next 45 days, the patient will complete Advance Directive, notarize and provide a copy to provider office Interventions provided by LCSW: . A voluntary discussion about advanced care  planning including importance of advanced directives, healthcare proxy and living will was discussed with the patient.  . Advised patient to think about who she would like as her health care agent Patient Self Care Activities:  . Is able to complete documentation independently . Able to identify next of kin or [place] . Patient will think about conversation advance directive and will had another discussion in 2 weeks Please see past updates related to this goal by clicking on the "Past Updates" button in the selected goal     . meals on wheels   On track    [place] (see longitudinal plan of care for additional care plan information)  Current Barriers & Progress:  . Patient needs community resources for meals on wheels . Acknowledges deficits and needs support, education and care coordination in order to meet this unmet need  . Patient is unable to prepare meals and rely's on grandson to bring her fast food . on wait list for mobile meals effective April 13th  . Continues to receive Moms Meals with delivery once per month Clinical Goal(s) Over the next 120 days assist patient will food needs with pre cooked meals Interventions provided by LCSW:  . Collaborated with CCM RN for ongoing patient needs . Contacted Melissa with [facility] /[employer]oms Meals for update on meal delivery extension options as well as update on meals on wheels . Contacted meals on wheels spoke Baker Janus, social worker [phone] ext [phone] ( she will complete patient's  meals on wheels application and notify LCSW when meals start) Patient Self Care Activities & Deficits:  . Patient is unable to independently navigate community resource options without care coordination support  . Acknowledges deficits and is motivated to resolve concern  Please see past updates related to this goal by clicking on the "Past Updates" button in the selected goal      . PACE program       Thank you for being  willing to explore options with the PACE program  . Colletta Maryland with PACE will call to provide you more information . Please review the brochures I provided about the program        Outpatient Encounter Medications as of 06/06/2020  Medication Sig  . acetaminophen (TYLENOL) 325 MG tablet Take 2 tablets (650 mg total) by mouth every 6 (six) hours.  . Blood Glucose Monitoring Suppl (ONETOUCH VERIO) w/Device KIT Check sugar 3 times per day  . feeding supplement, ENSURE ENLIVE, (ENSURE ENLIVE) LIQD Take 237 mLs by mouth 3 (three) times daily between meals.  . ferrous sulfate 324 MG TBEC Take 1 tablet (324 mg total) by mouth daily with breakfast.  . furosemide (LASIX) 40 MG tablet Take 1 tablet (40 mg total) by mouth daily.  Marland Kitchen gabapentin (NEURONTIN) 100 MG capsule Take 1 capsule (100 mg total) by mouth every 8 (eight) hours.  Marland Kitchen glucose blood (ONETOUCH VERIO) test strip Check blood sugar daily  . Incontinence Supply Disposable (INCONTINENCE BRIEF MEDIUM) MISC Wear as needed for incontinence  . Lancet Devices (MICROLET NEXT LANCING DEVICE) MISC 1 each by Does not apply route 3 (three) times daily. Check blood sugar 3 times daily  . levothyroxine (SYNTHROID) 150 MCG tablet Take 1 tablet (150 mcg total) by mouth daily.  Marland Kitchen lidocaine (LIDODERM) 5 % Apply one patch to each lower limbs for 12 hours a day as needed for phantom pain. Remove after 12 hours or as directed by MD  . Jonetta Speak LANCETS 97Q MISC Check blood sugar once daily  . PROAIR HFA 108 (90 Base) MCG/ACT inhaler INHALE 2 PUFFS INTO THE LUNGS EVERY 6 HOURS AS NEEDED FOR WHEEZING OR SHORTNESS OF BREATH  . rosuvastatin (CRESTOR) 20 MG tablet Take 1 tablet (20 mg total) by mouth daily.   No facility-administered encounter medications on file as of 06/06/2020.   Review of patient status, including review of consultants reports, relevant laboratory and other test results, and collaboration with appropriate care team members and the patient's  provider was performed as part of comprehensive patient evaluation and provision of care management services.   [name], [place] / [place]   [phone] 4:17 PM

## 2020-06-07 ENCOUNTER — Encounter: Payer: Self-pay | Admitting: Family Medicine

## 2020-06-07 ENCOUNTER — Ambulatory Visit (INDEPENDENT_AMBULATORY_CARE_PROVIDER_SITE_OTHER): Payer: Medicare Other | Admitting: Family Medicine

## 2020-06-07 ENCOUNTER — Other Ambulatory Visit: Payer: Self-pay

## 2020-06-07 VITALS — BP 128/74 | HR 78

## 2020-06-07 DIAGNOSIS — S20212A Contusion of left front wall of thorax, initial encounter: Secondary | ICD-10-CM

## 2020-06-07 NOTE — Progress Notes (Signed)
    CHIEF COMPLAINT / HPI:   Low back pain.  Patient fell out of her wheelchair earlier this week.  She wears a emergency alert necklace and she was able to use that and get someone to help her back into her chair.  Her low back on the left side has been sore particularly if she tries to twist when she is transferring.  It is improving.  Pain today's may be 2 or 3 out of 10 if she is twisting, at rest she is not having any pain.  PERTINENT  PMH / PSH: I have reviewed the patient's medications, allergies, past medical and surgical history, smoking status and updated in the EMR as appropriate.   OBJECTIVE:  BP 128/74   Pulse 78   SpO2 98%  GENERAL: Thin female no acute distress. EXTREMITY: Bilateral AKA.  Stump sites appear well-healed.  Internal and external rotation of the hips is full and painless. Back: Tender to Palpation across the Area of the Lower Posterior Left Ribs.  I Feel No Defect. Lungs: Clear to auscultation bilaterally.  She has air movement in all lung fields. PSYCH: AxOx4. Good eye contact.. No psychomotor retardation or agitation. Appropriate speech fluency and content. Asks and answers questions appropriately. Mood is congruent.   ASSESSMENT / PLAN: Fall out of her wheelchair.  I think she was fortunate in that the only thing I can find is some mild tenderness to palpation of her ribs.  She probably has some bruising here.  Both her PCP, Dr. [name] and our chronic care management team including Shayne Alken also stopped into see her today.  She will follow-up with her PCP.  We did briefly talk about PACE.  No problem-specific Assessment & Plan notes found for this encounter.   Dorcas Mcmurray MD

## 2020-06-07 NOTE — Patient Instructions (Signed)
I think you have some bruised ribs I suspect they will heal in time. We talked about PACE and they might be a great resource for you!

## 2020-06-08 ENCOUNTER — Ambulatory Visit: Payer: Medicare Other

## 2020-06-09 NOTE — Patient Instructions (Signed)
Visit Information  Goals Addressed              This Visit's Progress   .  " I am confused with having my legs amputatated and trying to cope" (pt-stated)        CARE PLAN ENTRY (see longtitudinal plan of care for additional care plan information)  Current Barriers:  . Chronic Disease Management support and education needs/crisis intervention in patient with bilateral below the knee amputation, Iron deficiency anemia, and community resources/urgent housing crisis  . Knowledge Deficits related to imbalanced nutrition less than body requirements as evidenced by Moderate protein-calorie malnutrition  Nurse Case Manager Clinical Goal(s):  Marland Kitchen Over the next 30 days, patient will verbalize understanding of plan  Interventions:  . Evaluation of current treatment plan related patient's adherence to plan as established by provider. . Provided education to patient re: nutrition and wound healing . Collaborated with SW regarding transportation . Discussed plans with patient for ongoing care management follow up and provided patient with direct contact information for care management team . Patient gave me a return call yesterday and she seemed in very good spirits.  She said a lot of good things were happening to her.  She said that someone from Meals [employer] Wheels called and they would start on 06/09/20.  Gene Brown from [facility] came out and put a temporary ramp on her home. At the visit in the office yesterday she received a some goodies from the office. The patient states that she is waiting to receive a call from someone to talk to her about the pace program.  . Patient Self Care Activities:  . Patient verbalizes understanding of plan  . Attends all scheduled provider appointments . Calls provider office for new concerns or questions . Unable to independently self manage community resources  Please see past updates related to this goal by clicking on the "Past Updates" button in the  selected goal         Ms. Kloosterman was given information about Care Management services today including:  1. Care Management services include personalized support from designated clinical staff supervised by her physician, including individualized plan of care and coordination with other care providers 2. 24/7 contact phone numbers for assistance for urgent and routine care needs. 3. The patient may stop CCM services at any time (effective at the end of the month) by phone call to the office staff.  Patient agreed to services and verbal consent obtained.   The patient verbalized understanding of instructions provided today and declined a print copy of patient instruction materials.    The care management team will reach out to the patient again over the next 14 days.   [name] RN, BSN, [facility] Care Management Coordinator [place] Phone: [phone] Fax: [phone]

## 2020-06-09 NOTE — Chronic Care Management (AMB) (Signed)
  Care Management   Follow Up Note   06/09/2020 Name: [name] MRN: [id number] DOB: 04-18-1946  Referred by: [name], MD Reason for referral : Appointment (Follow up)   [name] is a 74 y.o. year old female who is a primary care patient of [name], MD. The care management team was consulted for assistance with care management and care coordination needs.    Review of patient status, including review of consultants reports, relevant laboratory and other test results, and collaboration with appropriate care team members and the patient's provider was performed as part of comprehensive patient evaluation and provision of chronic care management services.    SDOH (Social Determinants of Health) assessments performed: No See Care Plan activities for detailed interventions related to [facility])     Advanced Directives: See Care Plan and Vynca application for related entries.   Goals Addressed              This Visit's Progress   .  " I am confused with having my legs amputatated and trying to cope" (pt-stated)        CARE PLAN ENTRY (see longtitudinal plan of care for additional care plan information)  Current Barriers:  . Chronic Disease Management support and education needs/crisis intervention in patient with bilateral below the knee amputation, Iron deficiency anemia, and community resources/urgent housing crisis  . Knowledge Deficits related to imbalanced nutrition less than body requirements as evidenced by Moderate protein-calorie malnutrition  Nurse Case Manager Clinical Goal(s):  Marland Kitchen Over the next 30 days, patient will verbalize understanding of plan  Interventions:  . Evaluation of current treatment plan related patient's adherence to plan as established by provider. . Provided education to patient re: nutrition and wound healing . Collaborated with SW regarding transportation . Discussed plans with patient for ongoing care management follow  up and provided patient with direct contact information for care management team . Patient gave me a return call yesterday and she seemed in very good spirits.  She said a lot of good things were happening to her.  She said that someone from [employer] Whe[employer] called and they would start on 06/09/20.  Gene Brown from [facility] came out and put a temporary ramp on her home. At the visit in the office yesterday she received a some goodies from the office. The patient states that she is waiting to receive a call from someone to talk to her about the pace program.  . Patient Self Care Activities:  . Patient verbalizes understanding of plan  . Attends all scheduled provider appointments . Calls provider office for new concerns or questions . Unable to independently self manage community resources  Please see past updates related to this goal by clicking on the "Past Updates" button in the selected goal          The care management team will reach out to the patient again over the next 14 days.   [name] RN, BSN, [facility] Care Management Coordinator [place] Phone: [phone] Fax: [phone]

## 2020-06-10 ENCOUNTER — Other Ambulatory Visit: Payer: Self-pay | Admitting: Family Medicine

## 2020-06-10 DIAGNOSIS — Z89611 Acquired absence of right leg above knee: Secondary | ICD-10-CM

## 2020-06-14 ENCOUNTER — Other Ambulatory Visit: Payer: Self-pay

## 2020-06-14 NOTE — Patient Outreach (Signed)
[facility]  06/14/2020  [name] Jan 07, 1946 [id number]   Care Coordination:  Placed call to patient to schedule first RN home visit. No answer. Left a message requesting a call back.  Tomasa Rand, RN, BSN, CEN [facility] [name] [phone]

## 2020-06-15 ENCOUNTER — Other Ambulatory Visit: Payer: Self-pay

## 2020-06-15 NOTE — Patient Outreach (Signed)
[facility]  06/15/2020  [name] 08-12-1946 [id number]   Care coordination:  Phone call to patient with assist of Gene with [employer].  Offered home visit for tomorrow---06/16/2020 and patient has accepted.  PLAN: RN home visit 06/16/2020.  Tomasa Rand, RN, BSN, CEN [facility] [name] [phone]

## 2020-06-16 ENCOUNTER — Other Ambulatory Visit: Payer: Self-pay

## 2020-06-16 ENCOUNTER — Ambulatory Visit: Payer: Medicare Other | Admitting: Licensed Clinical Social Worker

## 2020-06-16 DIAGNOSIS — Z789 Other specified health status: Secondary | ICD-10-CM

## 2020-06-16 DIAGNOSIS — Z741 Need for assistance with personal care: Secondary | ICD-10-CM

## 2020-06-16 NOTE — Patient Instructions (Addendum)
  Ms. Arseneau  it was nice speaking with you. Please call me directly [phone] if you have questions about the goals we discussed. Goals Addressed            This Visit's Progress   . COMPLETED: meals on wheels       Congratulations on your meals on wheel starting. As discussed today we will discontinue the Mom's Meals.    Marland Kitchen PACE program   Not on track    Patient Activities Thank you for being willing to explore options with the PACE program  . Colletta Maryland with PACE informed me she will send you more information as per your request . I have notified her that you are now ready to move forward.       Ms. Thaw received Care Management services today:  1. Care Management services include personalized support from designated clinical staff supervised by her physician, including individualized plan of care and coordination with other care providers 2. 24/7 contact [phone] for assistance for urgent and routine care needs. 3. Care Management are voluntary services and be declined at any time by calling the office.  Patient verbalizes understanding of instructions provided today.  Follow up plan: SW will follow up with patient by phone over the next 30 days  Maurine Cane, LCSW

## 2020-06-16 NOTE — Chronic Care Management (AMB) (Signed)
Care Management   Clinical Social Work Follow Up   06/16/2020 Name: [name] MRN: [id number] DOB: 1946/04/29 Referred by: [name], MD  Reason for referral : Care Coordination (ongoing needs)   [name] is a 74 y.o. year old female who is a primary care patient of [name], MD.  Reason for follow-up: assess for barriers and progress with PACE and Meals on Wheels.  Assessment: Patient is now receiving meals on wheels.  She continues to be ambivlent on going to PACE or staying with current practice.  Recommendation: Patient may benefit from, and is in agreement to move forward with the PACE program Interventions: Collaborated with CCM RN, PACE program and Melissa to stop the [employer]. Plan: LCSW will f/u with patient as needed in the next 30 days.   Advance Directive Status:  not addressed during this encounter.  SDOH (Social Determinants of Health) assessments performed: No new needs identified   Patient Care Plan: Community Support to manage Health Care needs  Problem Identified: Limited support   Long-Range Goal: meet activites of daily living   Start Date: 06/02/2020  Expected End Date: 08/18/2020  This Visit's Progress: On track  Priority: High    Current Barriers:   . Patient having difficulty meeting activities of daily living and has limited support.  Needs assistance and support in order to meet this unmet need Clinical Goals: Over the next 60 days, patient will explore options with the PACE program  Interventions : . Assessed needs, level of care concerns, basic eligibility and provided education on PACE  . Referral placed and collaboration with Colletta Maryland, Education officer, museum at the [employer]  . [place] and emotional support provided Patient Activities Thank you for being willing to explore options with the PACE program  . Colletta Maryland with PACE informed me she will send you more information as per your request . I have  notified her that you are now ready to move forward.       Outpatient Encounter Medications as of 06/16/2020  Medication Sig  . acetaminophen (TYLENOL) 325 MG tablet Take 2 tablets (650 mg total) by mouth every 6 (six) hours.  . Blood Glucose Monitoring Suppl (ONETOUCH VERIO) w/Device KIT Check sugar 3 times per day (Patient not taking: Reported on 06/16/2020)  . feeding supplement, ENSURE ENLIVE, (ENSURE ENLIVE) LIQD Take 237 mLs by mouth 3 (three) times daily between meals.  . ferrous sulfate 324 MG TBEC Take 1 tablet (324 mg total) by mouth daily with breakfast.  . furosemide (LASIX) 40 MG tablet Take 1 tablet (40 mg total) by mouth daily.  Marland Kitchen gabapentin (NEURONTIN) 100 MG capsule Take 1 capsule (100 mg total) by mouth every 8 (eight) hours. (Patient not taking: Reported on 06/16/2020)  . glucose blood (ONETOUCH VERIO) test strip Check blood sugar daily (Patient not taking: Reported on 06/16/2020)  . Incontinence Supply Disposable (INCONTINENCE BRIEF MEDIUM) MISC Wear as needed for incontinence  . Lancet Devices (MICROLET NEXT LANCING DEVICE) MISC 1 each by Does not apply route 3 (three) times daily. Check blood sugar 3 times daily (Patient not taking: Reported on 06/16/2020)  . levothyroxine (SYNTHROID) 150 MCG tablet Take 1 tablet (150 mcg total) by mouth daily.  Marland Kitchen lidocaine (LIDODERM) 5 % Apply one patch to each lower limbs for 12 hours a day as needed for phantom pain. Remove after 12 hours or as directed by MD  . Jonetta Speak LANCETS 47M MISC Check blood sugar once daily (Patient  not taking: Reported on 06/16/2020)  . PROAIR HFA 108 (90 Base) MCG/ACT inhaler INHALE 2 PUFFS INTO THE LUNGS EVERY 6 HOURS AS NEEDED FOR WHEEZING OR SHORTNESS OF BREATH  . rosuvastatin (CRESTOR) 20 MG tablet Take 1 tablet (20 mg total) by mouth daily.   No facility-administered encounter medications on file as of 06/16/2020.   Review of patient status, including review of consultants reports, relevant  laboratory and other test results, and collaboration with appropriate care team members and the patient's provider was performed as part of comprehensive patient evaluation and provision of care management services.   [name], [place] / [place]   [phone] 4:37 PM

## 2020-06-19 NOTE — Patient Outreach (Signed)
Aging Gracefully Program  RN Visit  06/19/2020  [name] 1945/10/12 [id number]  Visit:   RN Visit #1  Start Time:   1250 End Time:   1430  Minutes:   100 minutes   Readiness To Change Score:     Universal RN Interventions: Calendar Distribution: Yes Exercise Review: Yes Medications: Yes Medication Changes: Yes Mood: Yes Pain: Yes PCP Advocacy/Support: Yes Fall Prevention: Yes Incontinence: Yes Clinician View Of Client Situation: Patient living in 2 rooms of her home due  to wheelchair accessibility. Home clutter and in disrepair. Patient sitting in wheelchair. has billateraly above the knee amputations.  Cushion in wheelchair. hospital bed in living area.In good spirits . Patient noted to be self propelling herself with her arms. ( attemptimg to clean).  Door standing open. Temporary ramp at front door. Client View Of His/Her Situation: Patient reports to me that she is not planning to go to any facility, Reports she is able to self manage well. Reports she has meals on wheels, SCAT and support of her family.  1 recent fall after leaning over in her wheelchair. Paitent has stong faith and is in good spiritis.  Healthcare Provider Communication: Did Higher education careers adviser With [employer] Provider?: Yes Method Of Communication: Other: Healthcare Provider Response According to RN: email to embedded case manager.  Clinician View of Client Situation: Clinician View Of Client Situation: Patient living in 2 rooms of her home due  to wheelchair accessibility. Home clutter and in disrepair. Patient sitting in wheelchair. has billateraly above the knee amputations.  Cushion in wheelchair. hospital bed in living area.In good spirits . Patient noted to be self propelling herself with her arms. ( attemptimg to clean).  Door standing open. Temporary ramp at front door. Client's View of His/Her Situation: Client View Of His/Her Situation: Patient reports to me that she is not planning to go  to any facility, Reports she is able to self manage well. Reports she has meals on wheels, SCAT and support of her family.  1 recent fall after leaning over in her wheelchair. Paitent has stong faith and is in good spiritis.  Medication Assessment: Do You Have Any Problems Paying For Medications?: No Where Does Client Store Medications?: Other: (night stand) Can Client Read Pill Bottles?: Yes Does Client Use A Pillbox?: No Does Anyone Assist Client In Taking Medications?: Yes Name Of Person Assisting With Medications: grandsons Do You Take Vitamin D?: No Does Client Have Any Questions Or Concerns About Medictions?: No Is Client Complaining Of Any Symptoms That Could Be Side Effects To Medications?: No Any Possible Changes In Medication Regimen?: No   Session Summary: Spent time reviewed family support and patients long term wishes.  Patient has no desire or wishes to leave her home. She has family support with grandsons .  Currently lives alone and able to self manage well. Will plan to work with [place] case manager and social worker to assist with day time activities and care.  Communicated to Gene with [employer] solutions about assessment. Will continue to move forward with care plan and interventions to assist with renovations thar are planned.   Goals    .  " I am confused with having my legs amputatated and trying to cope" (pt-stated)      CARE PLAN ENTRY (see longtitudinal plan of care for additional care plan information)  Current Barriers:  . Chronic Disease Management support and education needs/crisis intervention in patient with bilateral below the knee amputation,  Iron deficiency anemia, and community resources/urgent housing crisis  . Knowledge Deficits related to imbalanced nutrition less than body requirements as evidenced by Moderate protein-calorie malnutrition  Nurse Case Manager Clinical Goal(s):  Marland Kitchen Over the next 30 days, patient will verbalize  understanding of plan  Interventions:  . Evaluation of current treatment plan related patient's adherence to plan as established by provider. . Provided education to patient re: nutrition and wound healing . Collaborated with SW regarding transportation . Discussed plans with patient for ongoing care management follow up and provided patient with direct contact information for care management team . Patient gave me a return call yesterday and she seemed in very good spirits.  She said a lot of good things were happening to her.  She said that someone from [employer] Whe[employer] called and they would start on 06/09/20.  Gene Brown from Aging gracefully came out and put a temporary ramp on her home. At the visit in the office yesterday she received a some goodies from the office. The patient states that she is waiting to receive a call from someone to talk to her about the pace program.  . Patient Self Care Activities:  . Patient verbalizes understanding of plan  . Attends all scheduled provider appointments . Calls provider office for new concerns or questions . Unable to independently self manage community resources  Please see past updates related to this goal by clicking on the "Past Updates" button in the selected goal      .  advance directives      CARE PLAN ENTRY (see longitudinal plan of care for additional care plan information)  Current Barriers & Progress:  . Patient  does not have an Forensic scientist . Acknowledges deficits, education and support in order to complete this document . Limited education about the importance of naming a healthcare power of attorney . Patient has advance directive packet but has not completed Clinical Social Work Goal(s):  Marland Kitchen Over the next 45 days, the patient will complete Advance Directive, notarize and provide a copy to provider office Interventions provided by LCSW: . A voluntary discussion about advanced care planning including importance of advanced  directives, healthcare proxy and living will was discussed with the patient.  . Advised patient to think about who she would like as her health care agent Patient Self Care Activities:  . Is able to complete documentation independently . Able to identify next of kin or [place] . Patient will think about conversation advance directive and will had another discussion in 2 weeks Please see past updates related to this goal by clicking on the "Past Updates" button in the selected goal     .  falls (pt-stated)      Aging Gracefully:  Patient will report not falling from wheelchair in the next 3 months.   Interventions: Reviewed with patient the importance of safety. Reviewed use of life alert. Will followup with OT about seat belt or anti- tip options.  Tomasa Rand, RN, BSN, CEN [facility] [name] [phone]     .  HEMOGLOBIN A1C < 7.0    .  I need a wheelchair ramp      CARE PLAN ENTRY (see longitudinal plan of care for additional care plan information)  Current Barriers & progress:  . Patient needs community resources to assist with wheelchair ramp  . Acknowledges deficits and needs support, education and care coordination in order to meet this unmet need  .  Patient received voice message about ramp from agency, has called several times but unable to speak to anyone . Patient has spoken with Butch Penny from [employer] about her ramp. Informed patient what she needs to provide to move forward . Patient provided requested information [employer] has asked for additional information.  Patient understands she has until July 5th to get all information returned  Clinical Goal(s)  . Over the next 160 days, patient will work with LCSW to address concerns related to wheelchair ramp Interventions provided by LCSW:  . Assessed patient's barriers with to connecting to [employer]( patient understands what she needs to do  and her grandson will help her) Patient Self Care Activities & Deficits:  . Patient is unable to independently navigate community resource options without care coordination support  . Acknowledges deficits and is motivated to resolve concern  . Patient will provided needed information with help from family Please see past updates related to this goal by clicking on the "Past Updates" button in the selected goal      .  PACE program      Patient Activities Thank you for being willing to explore options with the PACE program  . Colletta Maryland with PACE informed me she will send you more information as per your request . I have notified her that you are now ready to move forward.      .  Patient Stated      She wants to be able to get in and out of her house on her own safely (new flooring (vinyl plank)on the inside of house, ramp and sidewalk would help with this)    .  Patient Stated      She would like to have a functional bathroom she can access by wheelchair. (wider door at bathroom, working toilet--regular height round, walk or perhaps roll in shower, grab bars, hand held shower)    .  Patient Stated      She would like to be able to do her own laundry at home (instead of her grandsons having to go to laundry mat for her). Need washer and dryer looked at to see what is needed to make this happen.    .  Patient Stated      She would like to feel safe bending forward to pick things up from her wheelchair (seat belt, reacher, dressing stick    .  Patient Stated      Aging Gracefully: Patient states she wants to be able to use a pill planner for her medications.  Interventions: Will plan to take a pill planner to next home visit and reviewed medications with patient.  Tomasa Rand, RN, BSN, CEN [place] Coordinator [phone]       Tomasa Rand, RN, BSN, CEN [facility] [name] [phone]

## 2020-06-20 ENCOUNTER — Ambulatory Visit: Payer: Medicare Other

## 2020-06-20 ENCOUNTER — Ambulatory Visit: Payer: Medicare Other | Admitting: Licensed Clinical Social Worker

## 2020-06-20 DIAGNOSIS — Z741 Need for assistance with personal care: Secondary | ICD-10-CM

## 2020-06-20 DIAGNOSIS — Z7189 Other specified counseling: Secondary | ICD-10-CM

## 2020-06-20 DIAGNOSIS — Z789 Other specified health status: Secondary | ICD-10-CM

## 2020-06-20 NOTE — Chronic Care Management (AMB) (Signed)
   Social Work  Care Management Collaboration 06/20/2020 Name: [name] MRN: [id number] DOB: May 27, 1946 [name] is a 74 y.o. year old female who sees [name], Delorse Limber, MD for primary care.   Recommendation: After collaboration with CCM RN care manager, and Anthony with PACE it is determined that they will contact patient and move forward with the intake process for PACE. Marland Kitchen   Intervention: Patient was not interviewed or contacted during this encounter.  LCSW collaborated with CCM RN, and PACe program. Also left voice message for Tomasa Rand CCM RN with aging gracefully.  Review of patient status, including review of consultants reports, relevant laboratory and other test results, and collaboration with appropriate care team members and the patient's provider was performed as part of comprehensive patient evaluation and provision of chronic care management services.    Plan: no f/u scheduled with patient at this time will continue to collaborate with CCM team and PACE.  Will f/u with patient as needed     Patient Care Plan: Community Support to manage Health Care needs  Problem Identified: Limited support in the home   Priority: High  Long-Range Goal: meet activites of daily living   Start Date: 06/02/2020  Expected End Date: 08/18/2020  This Visit's Progress: On track  Recent Progress: On track  Priority: High  Note:   Current Barriers:   . Patient having difficulty meeting activities of daily living and has limited support.  Needs assistance and support in order to meet this unmet need Clinical Goals: Over the next 60 days, patient will explore options with the PACE program  Interventions : . Assessed needs, level of care concerns, basic eligibility and provided education on PACE  . Referral placed and collaboration with Colletta Maryland, Education officer, museum at the [place] with PACE program about moving forward with intake progress and getting forms for patient to  sign . PACE intake worker in [employer] Patient Activities . Sign required forms to start intake process with PACE   [name], [place] / [place]   [phone] 11:33 AM

## 2020-06-20 NOTE — Chronic Care Management (AMB) (Signed)
   RNCM Care Management Collaboration 06/20/2020 Name: [name] MRN: [id number] DOB: Jan 02, 1946   [name] is a 74 y.o. year old female who sees [name], Delorse Limber, MD for primary care. RNCM was contacted by Raquel Sarna representative with [employer] to assistance patient with  Care Coordination with electric wheelchair .     Intervention: Patient was not interviewed or contacted during this encounter. Review of patient status, including review of consultants reports, relevant laboratory and other test results, and collaboration with appropriate care team members and the patient's provider was performed as part of comprehensive patient evaluation and provision of chronic care management services.  Raquel Sarna from [place] called to let me know that they had seen the patient back on July 7th and completed everything needed for her wheelchair and all they needed was a signature from the PCP. RNCM called and spoke with Richrd Prime at [facility] [phone] and the patient  has an appointment with [employer] set for 11/18 to have an assessment done.    Plan:  1. RNCM has sent a staff message to the PCP asking for guidance and then we will proceed.  [name] RN, BSN, [facility] Care Management Coordinator [place] Phone: [phone] Fax: [phone]

## 2020-06-20 NOTE — Patient Instructions (Signed)
   Ms. Coggeshall received Care Management services today:  1. Care Management services include personalized support from designated clinical staff supervised by her physician, including individualized plan of care and coordination with other care providers 2. 24/7 contact [phone] for assistance for urgent and routine care needs. 3. Care Management are voluntary services and be declined at any time by calling the office.  Follow up plan: SW will follow up with patient by phone over the next 30 to 45 days or as needed  Maurine Cane, LCSW

## 2020-06-22 ENCOUNTER — Other Ambulatory Visit: Payer: Self-pay

## 2020-06-22 ENCOUNTER — Ambulatory Visit: Payer: Medicare Other

## 2020-06-22 NOTE — Chronic Care Management (AMB) (Signed)
Care Management   Follow Up Note   06/22/2020 Name: [name] MRN: [id number] DOB: 02-26-1946  Referred by: [name], MD Reason for referral : Chronic Care Management (Follow up resources)   [name] is a 74 y.o. year old female who is a primary care patient of Kari Mems Delorse Limber, MD. The care management team was consulted for assistance with care management and care coordination needs.    Review of patient status, including review of consultants reports, relevant laboratory and other test results, and collaboration with appropriate care team members and the patient's provider was performed as part of comprehensive patient evaluation and provision of chronic care management services.    SDOH (Social Determinants of Health) assessments performed: No See Care Plan activities for detailed interventions related to [name])     Advanced Directives: See Care Plan and [employer]yn[employer] application for related entries.   Goals Addressed              This Visit's Progress   .  " I am confused with having my legs amputatated and trying to cope" (pt-stated)        CARE PLAN ENTRY (see longtitudinal plan of care for additional care plan information)  Current Barriers:  . Chronic Disease Management support and education needs/crisis intervention in patient with bilateral below the knee amputation, Iron deficiency anemia, and community resources/urgent housing crisis  . Knowledge Deficits related to imbalanced nutrition less than body requirements as evidenced by Moderate protein-calorie malnutrition  Nurse Case Manager Clinical Goal(s):  Marland Kitchen Over the next 30 days, patient will verbalize understanding of plan  Interventions:  . Evaluation of current treatment plan related patient's adherence to plan as established by provider. . Provided education to patient re: nutrition and wound healing . Collaborated with SW regarding transportation . Discussed plans with patient for ongoing  care management follow up and provided patient with direct contact information for care management team . Spoke with the patient today and she is excited about the renovations that will happen to her home.  She said the family will come and help her start the process of throwing things away and putting things up that she wants to keep. . She said that she has had two offers  one from her niece and one from her grandson in [name] that she can come and stay with.  She thinks she will stay with the niece. . She states that she was told that they will get her a pod to put the things that she wants to keep in.  I advised her that the time is coming soon so she should get started and try to move things day by day instead of leaving things to the last minute.  She agreed. . She stated that her grandson came and took her to cookout last night.  It had been a long time since she had been out during the night time.  It was so refreshing to her.  . Patient Self Care Activities:  . Patient verbalizes understanding of plan  . Attends all scheduled provider appointments . Calls provider office for new concerns or questions . Unable to independently self manage community resources  Please see past updates related to this goal by clicking on the "Past Updates" button in the selected goal          The care management team will reach out to the patient again over the next 14 days.   [name] RN, BSN, [facility]  Care Management Coordinator [name] Phone: [phone] Fax: [phone]

## 2020-06-22 NOTE — Patient Instructions (Signed)
Visit Information  Goals Addressed              This Visit's Progress   .  " I am confused with having my legs amputatated and trying to cope" (pt-stated)        CARE PLAN ENTRY (see longtitudinal plan of care for additional care plan information)  Current Barriers:  . Chronic Disease Management support and education needs/crisis intervention in patient with bilateral below the knee amputation, Iron deficiency anemia, and community resources/urgent housing crisis  . Knowledge Deficits related to imbalanced nutrition less than body requirements as evidenced by Moderate protein-calorie malnutrition  Nurse Case Manager Clinical Goal(s):  Marland Kitchen Over the next 30 days, patient will verbalize understanding of plan  Interventions:  . Evaluation of current treatment plan related patient's adherence to plan as established by provider. . Provided education to patient re: nutrition and wound healing . Collaborated with SW regarding transportation . Discussed plans with patient for ongoing care management follow up and provided patient with direct contact information for care management team . Spoke with the patient today and she is excited about the renovations that will happen to her home.  She said the family will come and help her start the process of throwing things away and putting things up that she wants to keep. . She said that she has had two offers  one from her niece and one from her grandson in [place] that she can come and stay with.  She thinks she will stay with the niece. . She states that she was told that they will get her a pod to put the things that she wants to keep in.  I advised her that the time is coming soon so she should get started and try to move things day by day instead of leaving things to the last minute.  She agreed. . She stated that her grandson came and took her to cookout last night.  It had been a long time since she had been out during the night time.  It was so  refreshing to her.  . Patient Self Care Activities:  . Patient verbalizes understanding of plan  . Attends all scheduled provider appointments . Calls provider office for new concerns or questions . Unable to independently self manage community resources  Please see past updates related to this goal by clicking on the "Past Updates" button in the selected goal         Ms. Manville was given information about Care Management services today including:  1. Care Management services include personalized support from designated clinical staff supervised by her physician, including individualized plan of care and coordination with other care providers 2. 24/7 contact phone numbers for assistance for urgent and routine care needs. 3. The patient may stop CCM services at any time (effective at the end of the month) by phone call to the office staff.  Patient agreed to services and verbal consent obtained.   The patient verbalized understanding of instructions provided today and declined a print copy of patient instruction materials.   The care management team will reach out to the patient again over the next 14 days.   [name] RN, BSN, [facility] Care Management Coordinator [place] Phone: [phone] Fax: [phone]

## 2020-07-03 DIAGNOSIS — S78112D Complete traumatic amputation at level between left hip and knee, subsequent encounter: Secondary | ICD-10-CM | POA: Diagnosis not present

## 2020-07-03 DIAGNOSIS — I5032 Chronic diastolic (congestive) heart failure: Secondary | ICD-10-CM | POA: Diagnosis not present

## 2020-07-03 DIAGNOSIS — S78111D Complete traumatic amputation at level between right hip and knee, subsequent encounter: Secondary | ICD-10-CM | POA: Diagnosis not present

## 2020-07-03 DIAGNOSIS — M6281 Muscle weakness (generalized): Secondary | ICD-10-CM | POA: Diagnosis not present

## 2020-07-05 ENCOUNTER — Other Ambulatory Visit: Payer: Self-pay | Admitting: Occupational Therapy

## 2020-07-05 ENCOUNTER — Other Ambulatory Visit: Payer: Self-pay

## 2020-07-05 NOTE — Patient Instructions (Addendum)
he would like to feel safe bending forward to pick things up from her wheelchair (seat belt, reacher,  dressing stick)  ACTION PLANNING - CUSTOM  Target Problem Area: Keep from falling on the floor  Why Problem May Occur: Client has both legs amputated about the knees so leaning forward from her wheelchair really increases the chance of falling out of her wheelchair because she does not have a seatbelt on her manual wheelchair.     Target Goal: No falls out of wheelchair   STRATEGIES Modifying your home environment and making it safe: DO: DON'T:  Always use your seat belt on your wheelchair when you are sitting in it. Lean forward in your wheelchair without seatbelt on.  Use your reacher to pick up items if you can instead of bending over.             Practice It is important to practice the strategies so we can determine if they will be effective in helping to reach your goal. Follow these specific recommendations: 1. Use your seat belt at all times unless transfer from surface to surface; especially if you are going down an incline or over a bump. 2. Use your reacher to pick up things off floor instead of bending over to get them if at all possible. If a strategy does not work the first time, try it again and again (and maybe again). We may make some changes over the next few sessions, based on how they work.   [place], OTR/L     07/05/2020    Goals Addressed            This Visit's Progress   . Patient Stated   On track    She would like to feel safe bending forward to pick things up from her wheelchair (seat belt, reacher, dressing stick)

## 2020-07-05 NOTE — Patient Outreach (Signed)
[facility] Program  OT Follow-Up Visit  07/05/2020  [name] May 26, 1946 [id number]  Visit:  2- Second Visit  Start Time:  0905 End Time:  0950 Total Minutes:  25    Readiness to Change Score :  Readiness to Change Score: 10    Durable Medical Equipment: Adaptive Equipment: Other (seatbelt for her manual wheelchair) Adaptive Equipment Distribution Date: 07/05/20   Goals:  Goals Addressed            This Visit's Progress   . Patient Stated   On track    She would like to feel safe bending forward to pick things up from her wheelchair (seat belt, reacher, dressing stick)       Post Clinical Reasoning: Client Action (Goal) Four Interventions: Client provided with a seat belt for her wheelchair so that when she leans forward she will not nor have the fear of falling out of her wheelchair Did Client Try?: Yes Targeted Problem Area Status: A Lot Better Clinician View Of Client Situation:: Ms Losurdo is currently staying at her niece's house while her house is being renovated and modiifications made for safety and accessiblity. She is happy staying with her niece and her niece is happy to have her there. She has the most postive attitude for all that she has been through medically and personally, She was very excited that her niece had given her a coat to wear and a new blanket for her bed. I provided her with a seat belt for her manual wheelchair due to the first time I went to her home she had fallen out of her wheelchair the day before and layed on the hard concrete slab floor for 7 hours. The seatbelt worked great on her manual wheelchair. She was able to undo it and put it back on (D-ring with velcro closure). Client View Of His/Her Situation:: She was happy to get the seat belt so she won't fear falling out of her wheelchair again. She stated over and over that she felt so blessed by all the help she is getting and shared with me a couple of very uplifting songs (one  she sang). She is looking forward to getting back to her house. Next Visit Plan:: After she returns to her home where all the modifications are being done, will look at shower transfers.

## 2020-07-06 ENCOUNTER — Ambulatory Visit: Payer: Medicare Other | Admitting: Licensed Clinical Social Worker

## 2020-07-06 ENCOUNTER — Ambulatory Visit: Payer: Medicare Other | Attending: Family Medicine | Admitting: Physical Therapy

## 2020-07-06 ENCOUNTER — Telehealth: Payer: Self-pay

## 2020-07-06 ENCOUNTER — Ambulatory Visit: Payer: Medicare Other

## 2020-07-06 DIAGNOSIS — M6281 Muscle weakness (generalized): Secondary | ICD-10-CM | POA: Diagnosis not present

## 2020-07-06 DIAGNOSIS — R2689 Other abnormalities of gait and mobility: Secondary | ICD-10-CM

## 2020-07-06 NOTE — Telephone Encounter (Signed)
  Care Management     Care Management Outreach Note  07/06/2020 Name: [name] MRN: [id number] DOB: 1946-06-27  [name] is a 74 y.o. year old female who is a primary care patient of [name], MD . The Care Management team was consulted for assistance with . Care Coordination  RN Care Manager was called by [name] today by phone to ask for advise the patient received summons for jury duty and she said she needs a note that she will not be able to attend.  RNCM asked her to bring the note to the office and drop it off at the front desk so that it can be given to the appropriate person to write her a letter.    Follow Up Plan: RNCM will call the patient next week to see if she had the letter dropped off at the office   [name] RN, BSN, [facility] Care Management Coordinator [place] Phone: [phone] Fax: [phone]

## 2020-07-06 NOTE — Chronic Care Management (AMB) (Signed)
   Social Work  Care Management Collaboration 07/06/2020 Name: CARLIYAH COTTERMAN MRN: [id number] DOB: [birth year] Zaylee KYSA CALAIS is a 74 y.o. year old female who sees [name], Delorse Limber, MD for primary care.  LCSW was consulted by [employer] on [employer]. They went to deliver meal to patient and unable to reach her. Expressed concerns.  Intervention:.  LCSW collaborated with CCM RN and confirmed patient is temp staying with a family member while [facility] is working on her home.  CCM RN spoke with patient this morning and informed her to call [employer] to give them her new address.   LCSW returned call to Judson Roch [phone] ext [phone] with [employer] asked her to contact patient for her temp address.    Review of patient status, including review of consultants reports, relevant laboratory and other test results, and collaboration with appropriate care team members and the patient's provider was performed as part of comprehensive patient evaluation and provision of chronic care management services.    Plan: LCSW will continue to follow patient and collaborate with CCM RN as needed      [name], [place] / [place]   [phone] 9:48 AM

## 2020-07-07 ENCOUNTER — Encounter: Payer: Self-pay | Admitting: Physical Therapy

## 2020-07-07 NOTE — Therapy (Signed)
[facility] [address] [place], [place], [address] Phone: [phone]   Fax:  [phone]  Physical Therapy Evaluation  Patient Details  Name: [name] MRN: [id number] Date of Birth: October 02, 1945 Referring Provider (PT): Chrisandra Netters, MD   Encounter Date: 07/06/2020   PT End of Session - 07/07/20 1329    Visit Number 1    Authorization Type UHC Medicare    Authorization Time Period 07-06-20 - 08-05-20    PT Start Time 1100    PT Stop Time 1216    PT Time Calculation (min) 76 min    Activity Tolerance Patient tolerated treatment well    Behavior During Therapy [employer] for tasks assessed/performed           Past Medical History:  Diagnosis Date  . Bilateral lower extremity edema 06/06/2014  . Cataracts, bilateral 02/13/2011   Seen on eye exam 02/07/11. F/u in 12 months   . Diabetes mellitus age 89  . Diabetic ulcer of toe ([place]) 12/12/2015  . Hyperlipidemia   . Hypertension   . Retinal detachment, old, partial    left  . Retinopathy due to secondary diabetes mellitus ([place])    L>R, laser 3/07  . Schizotypal personality disorder ([place])   . Thyroid disease   . Weight loss 01/22/2012    Past Surgical History:  Procedure Laterality Date  . ABDOMINAL AORTOGRAM W/LOWER EXTREMITY N/A 07/23/2018   Procedure: ABDOMINAL AORTOGRAM W/LOWER EXTREMITY;  Surgeon: Marty Heck, MD;  Location: [facility];  Service: Cardiovascular;  Laterality: N/A;  . ABOVE KNEE LEG AMPUTATION Bilateral 09/27/2019  . AMPUTATION Bilateral 09/27/2019   Procedure: AMPUTATION ABOVE KNEE;  Surgeon: Angelia Mould, MD;  Location: [facility];  Service: Vascular;  Laterality: Bilateral;  . BREAST EXCISIONAL BIOPSY Bilateral   . BREAST SURGERY    . CATARACT EXTRACTION     left   . EYE SURGERY    . ROTATOR CUFF REPAIR  1990's   left  . Thyroid radiation ablation     for Graves Disease  . TRANSTHORACIC ECHOCARDIOGRAM       EF55-65%, nml - 12/14/2004    There were no vitals filed for this visit.    Subjective Assessment - 07/07/20 1324    Subjective Pt presents for power w/c eval in a manual w/c - accompanied by her niece; pt s/p bil. AKA on 09-27-19 due to osteomyelitis; pt is not a candidate for prostheses    Patient Stated Goals obtain power w/c for independence with mobility in her home    Currently in Pain? No/denies              [facility] PT Assessment - 07/07/20 0001      Assessment   Medical Diagnosis s/p bil. AKA due to osteomyelitis     Referring Provider (PT) Chrisandra Netters, MD    Onset Date/Surgical Date 09/27/19      Precautions   Precautions Fall      Balance Screen   Has the patient fallen in the past 6 months Yes    How many times? 1    Has the patient had a decrease in activity level because of a fear of falling?  Yes    Is the patient reluctant to leave their home because of a fear of falling?  Yes      Prior Function   Level of Independence Needs assistance with transfers;Needs assistance with ADLs  Objective measurements completed on examination: See above findings.           LMN for power w/c to be completed when quote received from vendor ([employer])                  Plan - 07/07/20 1332    Clinical Impression Statement Pt evaluated for power wheelchair with vendor Josh Cadle, ATP with [facility]; recommend [employer] power w/c with power tilt for independence with pressure relief    Personal Factors and Comorbidities Comorbidity 1;Fitness;Finances;Transportation    Comorbidities s/p bil. AKA (Feb. 2021)    Examination-Activity Limitations Locomotion Level;Transfers;Reach Overhead;Hygiene/Grooming;Toileting;Stairs;Stand    Examination-Participation Restrictions Meal Prep;Cleaning;Laundry;Shop    Stability/Clinical Decision Making Stable/Uncomplicated    Clinical Decision Making Low    PT Frequency One time visit      PT Treatment/Interventions Other (comment)   w/c management   Recommended Other Services power w/c from [facility] and Agree with Plan of Care Patient;Family member/caregiver    Family Member Consulted niece           Patient will benefit from skilled therapeutic intervention in order to improve the following deficits and impairments:  Difficulty walking, Decreased balance, Decreased strength, Decreased activity tolerance  Visit Diagnosis: Muscle weakness (generalized) - Plan: PT plan of care cert/re-cert  Other abnormalities of gait and mobility - Plan: PT plan of care cert/re-cert     Problem List Patient Active Problem List   Diagnosis Date Noted  . At high risk for pressure injury of skin 12/16/2019  . Uncontrolled type 2 diabetes mellitus with hyperglycemia ([place]) 11/12/2019  . S/P AKA (above knee amputation) bilateral ([place]) 11/12/2019  . Food insecurity 11/12/2019  . Wound infection   . Critical lower limb ischemia ([place])   . Moderate protein-calorie malnutrition ([place])   . Osteomyelitis ([place]) 09/23/2019  . Left breast mass 06/02/2019  . Stress 06/02/2019  . Peripheral vascular disease ([place]) 07/22/2018  . Dyspnea 03/23/2018  . Impaired functional mobility, balance, gait, and endurance 03/02/2018    Class: Diagnosis of  . Abnormality of gait 02/26/2018  . Iron deficiency anemia 03/19/2017  . Vision disturbance 03/20/2015  . Memory difficulty 03/20/2015  . At high risk for falls 11/10/2012  . DIABETIC  RETINOPATHY 03/17/2007  . Type 2 diabetes mellitus with diabetic neuropathy, unspecified ([place]) 03/17/2007  . Hypothyroidism 10/16/2006  . Type 2 diabetes mellitus ([place]) 10/16/2006  . OBESITY, NOS 10/16/2006  . HYPERTENSION, BENIGN SYSTEMIC 10/16/2006  . GASTROESOPHAGEAL REFLUX, NO ESOPHAGITIS 10/16/2006  . Rheumatoid arthritis ([place]) 10/16/2006  . Disorder of bone and cartilage 10/16/2006  . INCONTINENCE, URGE 10/16/2006    Alda Lea,  PT 07/07/2020, 1:42 PM  [facility] [address] [place], [place], [address] Phone: [phone]   Fax:  [phone]  Name: [name] MRN: [id number] Date of Birth: July 26, 1946

## 2020-07-09 NOTE — Chronic Care Management (AMB) (Signed)
   RN  Care Management   Follow Up Note   07/09/2020 Name: [name] MRN: [id number] DOB: [birth year]  Reason for referral : Chronic Care Management (Follow up)   [name] is a 74 y.o. year old female who is a primary care patient of [name], MD. The care management team was consulted for assistance with care management and care coordination needs.      Assessment: Called to follow up with the patient and she states that she is doing well.  Patient Care Plan: RN Case Manager  Problem Identified: Quality of Life  Maintained   Priority: Medium  Onset Date: 07/05/2020  Note:   Current Barriers:  . Chronic Disease Management support and education needs/crisis intervention in patient with bilateral below the knee amputation, Iron deficiency anemia, and community resources/urgent housing crisis  . Knowledge Deficits related to imbalanced nutrition less than body requirements as evidenced by Moderate protein-calorie malnutrition  Nurse Case Manager Clinical Goal(s):  Marland Kitchen Over the next 30 days, patient will verbalize understanding of plan  Interventions:  . Evaluation of current treatment plan related patient's adherence to plan as established by provider. . Provided education to patient re: nutrition and wound healing . Collaborated with SW regarding transportation . Discussed plans with patient for ongoing care management follow up and provided patient with direct contact information for care management team . Spoke with the patient today and she is doing well.   She is living with her niece.  She states that she had her electric wheel chair assessment.  She also stated that she had her first ride on scat.  She said that she is experiencing a  lot of new things that she has never done before and she is very grateful for all of the help and kindness show to her. . expression of thoughts about present/future encouraged . independence in all possible areas promoted . life  review by storytelling encouraged . patient strengths promoted . social relationships promoted . wellness behaviors promoted  Patient Goals/Self Care Activities:  . Patient verbalizes understanding of plan  . Attends all scheduled provider appointments . Calls provider office for new concerns or questions . Encouraged patient to write down information when she has appointments for follow up . tell my story and reason for my visit . make a list of questions . ask questions . repeat what I heard to make sure I understand . bring a list of my medicines to the visit . speak up when I don't understand  Follow up Plan: The care management team will reach out to the patient again over the next 14 days.       Review of patient status, including review of consultants reports, relevant laboratory and other test results, and collaboration with appropriate care team members and the patient's provider was performed as part of comprehensive patient evaluation and provision of chronic care management services.    SDOH (Social Determinants of Health) assessments performed: No See Care Plan activities for detailed interventions related to SDOH)    [place], BSN, [place] Management Coordinator [place] Phone: [phone] Fax: [phone]

## 2020-07-09 NOTE — Patient Instructions (Signed)
Visit Information  Goals Addressed              This Visit's Progress   .  " I am confused with having my legs amputatated and trying to cope" (pt-stated)        Patient Goals/Self Care Activities:  . Patient verbalizes understanding of plan  . Attends all scheduled provider appointments . Calls provider office for new concerns or questions . Encouraged patient to write down information when she has appointments for follow up . tell my story and reason for my visit . make a list of questions . ask questions . repeat what I heard to make sure I understand . bring a list of my medicines to the visit . speak up when I don't understand       Ms. Gilbertson was given information about Care Management services today including:  1. Care Management services include personalized support from designated clinical staff supervised by her physician, including individualized plan of care and coordination with other care providers 2. 24/7 contact phone numbers for assistance for urgent and routine care needs. 3. The patient may stop CCM services at any time (effective at the end of the month) by phone call to the office staff.  Patient agreed to services and verbal consent obtained.   The patient verbalized understanding of instructions, educational materials, and care plan provided today and declined offer to receive copy of patient instructions, educational materials, and care plan.   The care management team will reach out to the patient again over the next 14 days.   [name] RN, BSN, [facility] Care Management Coordinator [place] Phone: [phone] I Fax: [phone]

## 2020-07-11 ENCOUNTER — Ambulatory Visit: Payer: Medicare Other

## 2020-07-11 NOTE — Chronic Care Management (AMB) (Signed)
  Chronic Care Management   Note  07/11/2020 Name: [name] MRN: [id number] DOB: 03/15/1946  RNCM reached out to the patient today to let her know that the summons that she had brought to the office was received and put in her PCP's mailbox.  Follow up plan: RNCM will follow up with the patient at he next scheduled interval.  [name] RN, BSN, [place] Phone: [phone] I Fax: [phone]

## 2020-07-12 ENCOUNTER — Telehealth: Payer: Self-pay | Admitting: Family Medicine

## 2020-07-12 NOTE — Telephone Encounter (Signed)
Received note saying patient needs jury duty excuse letter written. Letter written, will place at front desk. Red team, please inform patient it is ready.  [name], MD

## 2020-07-17 NOTE — Telephone Encounter (Signed)
Called and informed patient.  [name], [place]

## 2020-08-02 DIAGNOSIS — S78111D Complete traumatic amputation at level between right hip and knee, subsequent encounter: Secondary | ICD-10-CM | POA: Diagnosis not present

## 2020-08-02 DIAGNOSIS — I5032 Chronic diastolic (congestive) heart failure: Secondary | ICD-10-CM | POA: Diagnosis not present

## 2020-08-02 DIAGNOSIS — M6281 Muscle weakness (generalized): Secondary | ICD-10-CM | POA: Diagnosis not present

## 2020-08-02 DIAGNOSIS — S78112D Complete traumatic amputation at level between left hip and knee, subsequent encounter: Secondary | ICD-10-CM | POA: Diagnosis not present

## 2020-08-04 ENCOUNTER — Ambulatory Visit: Payer: Medicare Other

## 2020-08-04 DIAGNOSIS — H04123 Dry eye syndrome of bilateral lacrimal glands: Secondary | ICD-10-CM | POA: Diagnosis not present

## 2020-08-04 DIAGNOSIS — H524 Presbyopia: Secondary | ICD-10-CM | POA: Diagnosis not present

## 2020-08-04 DIAGNOSIS — E113593 Type 2 diabetes mellitus with proliferative diabetic retinopathy without macular edema, bilateral: Secondary | ICD-10-CM | POA: Diagnosis not present

## 2020-08-04 NOTE — Chronic Care Management (AMB) (Signed)
Care Management   RN Case Manager Follow UpNote  08/04/2020 Name: [name] MRN: [id number] DOB: July 09, 1946 [name] is a 74 y.o. year old female who sees [name], Delorse Limber, MD for primary care.  Patient is enrolled in a Managed Medicaid plan: No.  The Care Management team was consulted to assist the patient with Disease Management, Educational Needs and Care Coordination.   RNCM engaged with [name] today by telephone for follow up  in response to provider referral for RN case management and/or care coordination services. See care plan below for details during this encounter.  Follow up Plan: The care management team will reach out to the patient again over the next 14 days.   Advanced Directives Status:Not addressed in this encounter.     SDOH (Social Determinants of Health) assessments performed: No     Patient Care Plan: RN Case Manager  Problem Identified: Quality of Life  Maintained   Priority: Medium  Onset Date: 07/05/2020  Note:   Current Barriers:  . Chronic Disease Management support and education needs/crisis intervention in patient with bilateral below the knee amputation, Iron deficiency anemia, and community resources/urgent housing crisis  . Knowledge Deficits related to imbalanced nutrition less than body requirements as evidenced by Moderate protein-calorie malnutrition  Nurse Case Manager Clinical Goal(s):  Marland Kitchen Over the next 90 days, patient will verbalize understanding of plan  Interventions:  . Evaluation of current treatment plan related patient's adherence to plan as established by provider. . Provided education to patient re: nutrition and wound healing . Collaborated with SW regarding transportation . Discussed plans with patient for ongoing care management follow up and provided patient with direct contact information for care management team . Spoke with the patient today and she is doing well.   She is still living with her niece.  She  states that she will be transitioning back into her home on the 23rd of December. She states that she is very excited to go back into her home.  She also states that she received the letter for jury duty and she is excused for life. Marland Kitchen expression of thoughts about present/future encouraged . independence in all possible areas promoted . life review by storytelling encouraged . patient strengths promoted . social relationships promoted . wellness behaviors promoted- Today the patient was at her ey doctor and had an exam and will be getting a prescrition for dry eye and on Monday she will be seeing Dr Deloria Lair her retina specialist for a check up.  Patient Goals/Self Care Activities:  . Patient verbalizes understanding of plan  . Attends all scheduled provider appointments . Calls provider office for new concerns or questions . Encouraged patient to write down information when she has appointments for follow up . tell my story and reason for my visit . make a list of questions . ask questions . repeat what I heard to make sure I understand . bring a list of my medicines to the visit . speak up when I don't understand         Outpatient Encounter Medications as of 08/04/2020  Medication Sig  . acetaminophen (TYLENOL) 325 MG tablet Take 2 tablets (650 mg total) by mouth every 6 (six) hours.  . Blood Glucose Monitoring Suppl (ONETOUCH VERIO) w/Device KIT Check sugar 3 times per day (Patient not taking: Reported on 06/16/2020)  . feeding supplement, ENSURE ENLIVE, (ENSURE ENLIVE) LIQD Take 237 mLs by mouth 3 (three) times daily between meals.  Marland Kitchen  ferrous sulfate 324 MG TBEC Take 1 tablet (324 mg total) by mouth daily with breakfast.  . furosemide (LASIX) 40 MG tablet Take 1 tablet (40 mg total) by mouth daily.  Marland Kitchen gabapentin (NEURONTIN) 100 MG capsule Take 1 capsule (100 mg total) by mouth every 8 (eight) hours. (Patient not taking: Reported on 06/16/2020)  . glucose blood (ONETOUCH VERIO)  test strip Check blood sugar daily (Patient not taking: Reported on 06/16/2020)  . Incontinence Supply Disposable (INCONTINENCE BRIEF MEDIUM) MISC Wear as needed for incontinence  . Lancet Devices (MICROLET NEXT LANCING DEVICE) MISC 1 each by Does not apply route 3 (three) times daily. Check blood sugar 3 times daily (Patient not taking: Reported on 06/16/2020)  . levothyroxine (SYNTHROID) 150 MCG tablet Take 1 tablet (150 mcg total) by mouth daily.  Marland Kitchen lidocaine (LIDODERM) 5 % Apply one patch to each lower limbs for 12 hours a day as needed for phantom pain. Remove after 12 hours or as directed by MD  . Jonetta Speak LANCETS 25J MISC Check blood sugar once daily (Patient not taking: Reported on 06/16/2020)  . PROAIR HFA 108 (90 Base) MCG/ACT inhaler INHALE 2 PUFFS INTO THE LUNGS EVERY 6 HOURS AS NEEDED FOR WHEEZING OR SHORTNESS OF BREATH  . rosuvastatin (CRESTOR) 20 MG tablet Take 1 tablet (20 mg total) by mouth daily.   No facility-administered encounter medications on file as of 08/04/2020.    Review of patient status, including review of consultants reports, relevant laboratory and other test results, and collaboration with appropriate care team members and the patient's provider was performed as part of comprehensive patient evaluation and provision of chronic care management services.       Information about Care Management services was shared with [name] today including:  1. Care Management services include personalized support from designated clinical staff supervised by her physician, including individualized plan of care and coordination with other care providers 2. Remind patient of 24/7 contact phone numbers to provider's office for assistance with urgent and routine care needs. 3. Care Management services are voluntary and patient may stop at any time .   Patient agreed to services provided today and verbal consent obtained.

## 2020-08-04 NOTE — Patient Instructions (Signed)
°  [name]  it was nice speaking with you. Please call me directly [phone] if you have questions about the goals we discussed.  Patient Goals/Self Care Activities:   Patient verbalizes understanding of plan   Attends all scheduled provider appointments  Calls provider office for new concerns or questions  Encouraged patient to write down information when she has appointments for follow up  tell my story and reason for my visit  make a list of questions  ask questions  repeat what I heard to make sure I understand  bring a list of my medicines to the visit  speak up when I don't understand   Patient Care Plan: RN Case Manager  Problem Identified: Quality of Life  Maintained   Priority: Medium  Onset Date: 07/05/2020  Note:   Current Barriers:   Chronic Disease Management support and education needs/crisis intervention in patient with bilateral below the knee amputation, Iron deficiency anemia, and community resources/urgent housing crisis   Knowledge Deficits related to imbalanced nutrition less than body requirements as evidenced by Moderate protein-calorie malnutrition  Nurse Case Manager Clinical Goal(s):   Over the next 90 days, patient will verbalize understanding of plan  Interventions:   Evaluation of current treatment plan related patient's adherence to plan as established by provider.  Provided education to patient re: nutrition and wound healing  Collaborated with SW regarding transportation  Discussed plans with patient for ongoing care management follow up and provided patient with direct contact information for care management team  Spoke with the patient today and she is doing well.   She is still living with her niece.  She states that she will be transitioning back into her home on the 23rd of December. She states that she is very excited to go back into her home.  She also states that she received the letter for jury duty and she is excused for  life.  expression of thoughts about present/future encouraged  independence in all possible areas promoted  life review by storytelling encouraged  patient strengths promoted  social relationships promoted  wellness behaviors promoted- Today the patient was at her ey doctor and had an exam and will be getting a prescrition for dry eye and on Monday she will be seeing Dr Deloria Lair her retina specialist for a check up.   Follow up Plan: The care management team will reach out to the patient again over the next 14 days.        [name] received Care Management services today:  1. Care Management services include personalized support from designated clinical staff supervised by her physician, including individualized plan of care and coordination with other care providers 2. 24/7 contact [phone] for assistance for urgent and routine care needs. 3. Care Management are voluntary services and be declined at any time by calling the office.  The patient verbalized understanding of instructions provided today and declined a print copy of patient instruction materials.     [name], RN

## 2020-08-07 ENCOUNTER — Other Ambulatory Visit: Payer: Self-pay

## 2020-08-07 ENCOUNTER — Ambulatory Visit (INDEPENDENT_AMBULATORY_CARE_PROVIDER_SITE_OTHER): Payer: Medicare Other | Admitting: Ophthalmology

## 2020-08-07 ENCOUNTER — Encounter (INDEPENDENT_AMBULATORY_CARE_PROVIDER_SITE_OTHER): Payer: Self-pay | Admitting: Ophthalmology

## 2020-08-07 DIAGNOSIS — E113553 Type 2 diabetes mellitus with stable proliferative diabetic retinopathy, bilateral: Secondary | ICD-10-CM | POA: Diagnosis not present

## 2020-08-07 DIAGNOSIS — Z794 Long term (current) use of insulin: Secondary | ICD-10-CM

## 2020-08-07 DIAGNOSIS — Z961 Presence of intraocular lens: Secondary | ICD-10-CM | POA: Insufficient documentation

## 2020-08-07 LAB — HM DIABETES EYE EXAM

## 2020-08-07 NOTE — Assessment & Plan Note (Signed)
The nature of regressed proliferative diabetic retinopathy was discussed with the patient. The patient was advised to maintain good glucose, blood pressure, monitor kidney function and serum lipid control as advised by personal physician. Rare risk for reactivation of progression exist with untreated severe anemia, untreated renal failure, untreated heart failure, and smoking. Complete avoidance of smoking was recommended. The chance of recurrent proliferative diabetic retinopathy was discussed as well as the chance of vitreous hemorrhage for which further treatments may be necessary.   Explained to the patient that the quiescent  proliferative diabetic retinopathy disease is unlikely to ever worsen.  Worsening factors would include however severe anemia, hypertension out-of-control or impending renal failure.  The nature of regressed diabetic macular edema was discussed with the patient as well as the possibility of recurrence. The importance of the tight glucose, blood pressure, and serum lipid control were emphasized. The importance of compliance with follow up was emphasized. The patient was advised to avoid smoking.  Currently the swelling in macula is controlled, and improved from prior visits.

## 2020-08-07 NOTE — Assessment & Plan Note (Signed)
Visual acuity OD has improved dramatically since last visit nearly 2 years previous.

## 2020-08-07 NOTE — Progress Notes (Signed)
08/07/2020     CHIEF COMPLAINT Patient presents for Retina Follow Up   HISTORY OF PRESENT ILLNESS: [name] is a 74 y.o. female who presents to the clinic today for:   HPI    Retina Follow Up    Patient presents with  Diabetic Retinopathy.  In both eyes.  This started 1 year ago.  Duration of 1 year.          Comments    1 YR FU OU, Cat SX OD 04/2020   Pt reports vision stable OD, OS blurry especially reading, no new f/f, no pain or pressure.     Last A1C: per Dr. [name] no longer diabetic     Last BS: unsure due to losing monitor, but in June at the hospital was in the 100's.         Last edited by Nichola Sizer D on 08/07/2020  1:28 PM. (History)      Referring physician: [name], MD [place],  [place] [id number]  HISTORICAL INFORMATION:   Selected notes from the MEDICAL RECORD NUMBER    Lab Results  Component Value Date   HGBA1C 5.8 03/23/2020     CURRENT MEDICATIONS: No current outpatient medications on file. (Ophthalmic Drugs)   No current facility-administered medications for this visit. (Ophthalmic Drugs)   Current Outpatient Medications (Other)  Medication Sig  . acetaminophen (TYLENOL) 325 MG tablet Take 2 tablets (650 mg total) by mouth every 6 (six) hours.  . Blood Glucose Monitoring Suppl (ONETOUCH VERIO) w/Device KIT Check sugar 3 times per day (Patient not taking: Reported on 06/16/2020)  . feeding supplement, ENSURE ENLIVE, (ENSURE ENLIVE) LIQD Take 237 mLs by mouth 3 (three) times daily between meals.  . ferrous sulfate 324 MG TBEC Take 1 tablet (324 mg total) by mouth daily with breakfast.  . furosemide (LASIX) 40 MG tablet Take 1 tablet (40 mg total) by mouth daily.  Marland Kitchen gabapentin (NEURONTIN) 100 MG capsule Take 1 capsule (100 mg total) by mouth every 8 (eight) hours. (Patient not taking: Reported on 06/16/2020)  . glucose blood (ONETOUCH VERIO) test strip Check blood sugar daily (Patient not  taking: Reported on 06/16/2020)  . Incontinence Supply Disposable (INCONTINENCE BRIEF MEDIUM) MISC Wear as needed for incontinence  . Lancet Devices (MICROLET NEXT LANCING DEVICE) MISC 1 each by Does not apply route 3 (three) times daily. Check blood sugar 3 times daily (Patient not taking: Reported on 06/16/2020)  . levothyroxine (SYNTHROID) 150 MCG tablet Take 1 tablet (150 mcg total) by mouth daily.  Marland Kitchen lidocaine (LIDODERM) 5 % Apply one patch to each lower limbs for 12 hours a day as needed for phantom pain. Remove after 12 hours or as directed by MD  . Jonetta Speak LANCETS 52W MISC Check blood sugar once daily (Patient not taking: Reported on 06/16/2020)  . PROAIR HFA 108 (90 Base) MCG/ACT inhaler INHALE 2 PUFFS INTO THE LUNGS EVERY 6 HOURS AS NEEDED FOR WHEEZING OR SHORTNESS OF BREATH  . rosuvastatin (CRESTOR) 20 MG tablet Take 1 tablet (20 mg total) by mouth daily.   No current facility-administered medications for this visit. (Other)      REVIEW OF SYSTEMS:    ALLERGIES Allergies  Allergen Reactions  . Feraheme [Ferumoxytol] Itching  . Rosiglitazone Maleate Other (See Comments)    REACTION: Difficulty walking, Fatigue, shortness of breath    PAST MEDICAL HISTORY Past Medical History:  Diagnosis Date  . Bilateral lower extremity edema 06/06/2014  .  Cataracts, bilateral 02/13/2011   Seen on eye exam 02/07/11. F/u in 12 months   . Diabetes mellitus age 46  . Diabetic ulcer of toe ([place]) 12/12/2015  . Hyperlipidemia   . Hypertension   . Retinal detachment, old, partial    left  . Retinopathy due to secondary diabetes mellitus ([place])    L>R, laser 3/07  . Schizotypal personality disorder ([place])   . Thyroid disease   . Weight loss 01/22/2012   Past Surgical History:  Procedure Laterality Date  . ABDOMINAL AORTOGRAM W/LOWER EXTREMITY N/A 07/23/2018   Procedure: ABDOMINAL AORTOGRAM W/LOWER EXTREMITY;  Surgeon: Marty Heck, MD;  Location: [facility];  Service:  Cardiovascular;  Laterality: N/A;  . ABOVE KNEE LEG AMPUTATION Bilateral 09/27/2019  . AMPUTATION Bilateral 09/27/2019   Procedure: AMPUTATION ABOVE KNEE;  Surgeon: Angelia Mould, MD;  Location: [facility];  Service: Vascular;  Laterality: Bilateral;  . BREAST EXCISIONAL BIOPSY Bilateral   . BREAST SURGERY    . CATARACT EXTRACTION     left   . EYE SURGERY    . ROTATOR CUFF REPAIR  1990's   left  . Thyroid radiation ablation     for Graves Disease  . TRANSTHORACIC ECHOCARDIOGRAM      EF55-65%, nml - 12/14/2004    FAMILY HISTORY Family History  Problem Relation Age of Onset  . Heart disease Mother   . Hypertension Mother   . Heart attack Mother   . Colon cancer Brother   . Colon cancer Maternal Grandmother   . Pulmonary fibrosis Father   . Diabetes Daughter   . Liver disease Sister        transplant, liver  . Liver cancer Brother   . Breast cancer Niece     SOCIAL HISTORY Social History   Tobacco Use  . Smoking status: Never Smoker  . Smokeless tobacco: Never Used  Vaping Use  . Vaping Use: Never used  Substance Use Topics  . Alcohol use: No  . Drug use: No         OPHTHALMIC EXAM:  Base Eye Exam    Visual Acuity (ETDRS)      Right Left   Dist [place]  20/70 +1   Dist cc 20/50 +1    Dist ph [place]  20/60 -2   Dist ph cc NI    Correction: Glasses       Tonometry (Tonopen, 1:40 PM)      Right Left   Pressure 14 15       Pupils      Dark Light Shape React APD   Right 3 2 Round Sluggish None   Left 3 2 Round Sluggish None       Visual Fields (Counting fingers)      Left Right     Full   Restrictions Partial outer inferior temporal deficiency        Extraocular Movement      Right Left    Full Full       Neuro/Psych    Oriented x3: Yes        Slit Lamp and Fundus Exam    External Exam      Right Left   External Normal Normal       Slit Lamp Exam      Right Left   Lids/Lashes Normal Normal   Conjunctiva/Sclera White and quiet White and  quiet   Cornea Clear Clear   Anterior Chamber Deep and quiet Deep and  quiet   Iris Round and reactive Round and reactive   Lens Open posterior capsule, Centered posterior chamber intraocular lens Open posterior capsule, Centered posterior chamber intraocular lens   Anterior Vitreous Normal Normal       Fundus Exam      Right Left   Posterior Vitreous Normal Normal   Disc Normal Old fibrous proliferations on the nerve, stable   C/D Ratio 0.5 0.5   Macula Focal laser scars Focal laser scars   Vessels PDR-quiet PDR-quiet   Periphery Laser scars, good PRP, no active N/V Laser scars, good PRP, no active N/V          IMAGING AND PROCEDURES  Imaging and Procedures for 08/07/20  OCT, Retina - OU - Both Eyes       Right Eye Quality was good. Scan locations included subfoveal. Central Foveal Thickness: 169. Progression has been stable.   Left Eye Quality was poor.   Notes OD, diffuse macular atrophy temporally, no active CSME, stable will observe  OS no views patient positioning Difficulties                ASSESSMENT/PLAN:  Controlled type 2 diabetes mellitus with stable proliferative retinopathy of both eyes, with long-term current use of insulin (HCC) The nature of regressed proliferative diabetic retinopathy was discussed with the patient. The patient was advised to maintain good glucose, blood pressure, monitor kidney function and serum lipid control as advised by personal physician. Rare risk for reactivation of progression exist with untreated severe anemia, untreated renal failure, untreated heart failure, and smoking. Complete avoidance of smoking was recommended. The chance of recurrent proliferative diabetic retinopathy was discussed as well as the chance of vitreous hemorrhage for which further treatments may be necessary.   Explained to the patient that the quiescent  proliferative diabetic retinopathy disease is unlikely to ever worsen.  Worsening factors would  include however severe anemia, hypertension out-of-control or impending renal failure.  The nature of regressed diabetic macular edema was discussed with the patient as well as the possibility of recurrence. The importance of the tight glucose, blood pressure, and serum lipid control were emphasized. The importance of compliance with follow up was emphasized. The patient was advised to avoid smoking.  Currently the swelling in macula is controlled, and improved from prior visits.  Pseudophakia of both eyes Visual acuity OD has improved dramatically since last visit nearly 2 years previous.      ICD-10-CM   1. Controlled type 2 diabetes mellitus with stable proliferative retinopathy of both eyes, with long-term current use of insulin (HCC)  E11.3553 OCT, Retina - OU - Both Eyes   Z79.4   2. Pseudophakia of both eyes  Z96.1     1.  Delighted to see patient's visual acuity status improved from count fingers and now 20/50 in the right eye post cataract extraction with intraocular lens placement some 1 to 1.5 years previous.  No progression of diabetic retinopathy in either eye  2.  Condition still stable in each eye.  Will observe  3.  Ophthalmic Meds Ordered this visit:  No orders of the defined types were placed in this encounter.      Return in about 6 months (around 02/05/2021) for DILATE OU, OCT.  There are no Patient Instructions on file for this visit.   Explained the diagnoses, plan, and follow up with the patient and they expressed understanding.  Patient expressed understanding of the importance of proper follow up care.   Dominica Severin  Dorina Hoyer M.D. Diseases & Surgery of the Retina and Vitreous [facility] [place] 08/07/20     Abbreviations: M myopia (nearsighted); A astigmatism; H hyperopia (farsighted); P presbyopia; Mrx spectacle prescription;  CTL contact lenses; OD right eye; OS left eye; OU both eyes  XT exotropia; ET esotropia; PEK punctate epithelial  keratitis; PEE punctate epithelial erosions; DES dry eye syndrome; MGD meibomian gland dysfunction; ATs artificial tears; PFAT's preservative free artificial tears; [place] nuclear sclerotic cataract; PSC posterior subcapsular cataract; ERM epi-retinal membrane; PVD posterior vitreous detachment; RD retinal detachment; DM diabetes mellitus; DR diabetic retinopathy; NPDR non-proliferative diabetic retinopathy; PDR proliferative diabetic retinopathy; CSME clinically significant macular edema; DME diabetic macular edema; dbh dot blot hemorrhages; CWS cotton wool spot; POAG primary open angle glaucoma; C/D cup-to-disc ratio; HVF humphrey visual field; GVF goldmann visual field; OCT optical coherence tomography; IOP intraocular pressure; BRVO Branch retinal vein occlusion; CRVO central retinal vein occlusion; CRAO central retinal artery occlusion; BRAO branch retinal artery occlusion; RT retinal tear; SB scleral buckle; PPV pars plana vitrectomy; VH Vitreous hemorrhage; PRP panretinal laser photocoagulation; IVK intravitreal kenalog; VMT vitreomacular traction; MH Macular hole;  NVD neovascularization of the disc; NVE neovascularization elsewhere; AREDS age related eye disease study; ARMD age related macular degeneration; POAG primary open angle glaucoma; EBMD epithelial/anterior basement membrane dystrophy; ACIOL anterior chamber intraocular lens; IOL intraocular lens; PCIOL posterior chamber intraocular lens; Phaco/IOL phacoemulsification with intraocular lens placement; [place] photorefractive keratectomy; LASIK laser assisted in situ keratomileusis; HTN hypertension; DM diabetes mellitus; COPD chronic obstructive pulmonary disease

## 2020-08-09 ENCOUNTER — Encounter: Payer: Self-pay | Admitting: Occupational Therapy

## 2020-08-10 ENCOUNTER — Encounter: Payer: Self-pay | Admitting: Occupational Therapy

## 2020-08-17 ENCOUNTER — Ambulatory Visit: Payer: Medicare Other

## 2020-08-17 NOTE — Patient Instructions (Signed)
  Ms. Ornstein  it was nice speaking with you. Please call me directly [phone] if you have questions about the goals we discussed.  Patient Goals/Self Care Activities:  . Patient verbalizes understanding of plan  . Attends all scheduled provider appointments . Calls provider office for new concerns or questions . Encouraged patient to write down information when she has appointments for follow up . tell my story and reason for my visit . make a list of questions . ask questions . repeat what I heard to make sure I understand . bring a list of my medicines to the visit . speak up when I don't understand   Patient Care Plan: RN Case Manager    Problem Identified: Quality of Life  Maintained   Priority: Medium  Onset Date: 07/05/2020  Current Barriers:  . Chronic Disease Management support and education needs/crisis intervention in patient with bilateral below the knee amputation, Iron deficiency anemia, and community resources/urgent housing crisis  . Knowledge Deficits related to imbalanced nutrition less than body requirements as evidenced by Moderate protein-calorie malnutrition  Nurse Case Manager Clinical Goal(s):  Marland Kitchen Over the next 90 days, patient will verbalize understanding of plan  Interventions:  . Evaluation of current treatment plan related patient's adherence to plan as established by provider. . Provided education to patient re: nutrition and wound healing . Collaborated with SW regarding transportation . Discussed plans with patient for ongoing care management follow up and provided patient with direct contact information for care management team . Spoke with the patient today and she is doing fair.   She is still living with her niece.  She states that she wis not back in her home yet.  They still  needed to do some more work to her home.  She is planning to go back into the home on 08-22-20. Marland Kitchen expression of thoughts about present/future encouraged . independence in all  possible areas promoted . life review by storytelling encouraged . patient strengths promoted . social relationships promoted . wellness behaviors promoted- She is tryig to stay more independent is all areas in her life. . The patient stated her youngest daughter has passed and she went to the funeral today. The daught had been sick for a while but she is doing ok.  I gave her my condolences.    Follow up Plan: The care management team will reach out to the patient again over the next 14 days.        Ms. Devera received Care Management services today:  1. Care Management services include personalized support from designated clinical staff supervised by her physician, including individualized plan of care and coordination with other care providers 2. 24/7 contact [phone] for assistance for urgent and routine care needs. 3. Care Management are voluntary services and be declined at any time by calling the office.  The patient verbalized understanding of instructions provided today and declined a print copy of patient instruction materials.    [name], RN

## 2020-08-17 NOTE — Chronic Care Management (AMB) (Signed)
Care Management   RN Case Manager Follow Up Note  08/17/2020 Name: [name] MRN: [id number] DOB: September 28, 1945 [name] is a 73 y.o. year old female who sees [name], Kari Limber, MD for primary care.  Patient is enrolled in a Managed Medicaid plan: No.  The Care Management team was consulted by PCP to assist the patient with Care Coordination.   RNCM engaged with [name] today Engaged with patient by telephone  in response to provider referral for RN case management and/or care coordination services. See care plan below for details during this encounter.  Follow up Plan:The care management team will reach out to the patient again over the next 14 days.   Advanced Directives Status:Not addressed in this encounter.     SDOH (Social Determinants of Health) assessments performed: No     Patient Care Plan: RN Case Manager    Problem Identified: Quality of Life  Maintained   Priority: Medium  Onset Date: 07/05/2020  Current Barriers:  . Chronic Disease Management support and education needs/crisis intervention in patient with bilateral below the knee amputation, Iron deficiency anemia, and community resources/urgent housing crisis  . Knowledge Deficits related to imbalanced nutrition less than body requirements as evidenced by Moderate protein-calorie malnutrition  Nurse Case Manager Clinical Goal(s):  Marland Kitchen Over the next 90 days, patient will verbalize understanding of plan  Interventions:  . Evaluation of current treatment plan related patient's adherence to plan as established by provider. . Provided education to patient re: nutrition and wound healing . Collaborated with SW regarding transportation . Discussed plans with patient for ongoing care management follow up and provided patient with direct contact information for care management team . Spoke with the patient today and she is doing fair.   She is still living with her niece.  She states that she wis not back in her  home yet.  They still  needed to do some more work to her home.  She is planning to go back into the home on 08-22-20. Marland Kitchen expression of thoughts about present/future encouraged . independence in all possible areas promoted . life review by storytelling encouraged . patient strengths promoted . social relationships promoted . wellness behaviors promoted- She is tryig to stay more independent is all areas in her life. . The patient stated her youngest daughter has passed and she went to the funeral today. The daught had been sick for a while but she is doing ok.  I gave her my condolences.   Patient Goals/Self Care Activities:  . Patient verbalizes understanding of plan  . Attends all scheduled provider appointments . Calls provider office for new concerns or questions . Encouraged patient to write down information when she has appointments for follow up . tell my story and reason for my visit . make a list of questions . ask questions . repeat what I heard to make sure I understand . bring a list of my medicines to the visit . speak up when I don't understand       Outpatient Encounter Medications as of 08/17/2020  Medication Sig  . acetaminophen (TYLENOL) 325 MG tablet Take 2 tablets (650 mg total) by mouth every 6 (six) hours.  . Blood Glucose Monitoring Suppl (ONETOUCH VERIO) w/Device KIT Check sugar 3 times per day (Patient not taking: Reported on 06/16/2020)  . feeding supplement, ENSURE ENLIVE, (ENSURE ENLIVE) LIQD Take 237 mLs by mouth 3 (three) times daily between meals.  . ferrous sulfate 324 MG  TBEC Take 1 tablet (324 mg total) by mouth daily with breakfast.  . furosemide (LASIX) 40 MG tablet Take 1 tablet (40 mg total) by mouth daily.  Marland Kitchen gabapentin (NEURONTIN) 100 MG capsule Take 1 capsule (100 mg total) by mouth every 8 (eight) hours. (Patient not taking: Reported on 06/16/2020)  . glucose blood (ONETOUCH VERIO) test strip Check blood sugar daily (Patient not taking: Reported  on 06/16/2020)  . Incontinence Supply Disposable (INCONTINENCE BRIEF MEDIUM) MISC Wear as needed for incontinence  . Lancet Devices (MICROLET NEXT LANCING DEVICE) MISC 1 each by Does not apply route 3 (three) times daily. Check blood sugar 3 times daily (Patient not taking: Reported on 06/16/2020)  . levothyroxine (SYNTHROID) 150 MCG tablet Take 1 tablet (150 mcg total) by mouth daily.  Marland Kitchen lidocaine (LIDODERM) 5 % Apply one patch to each lower limbs for 12 hours a day as needed for phantom pain. Remove after 12 hours or as directed by MD  . Jonetta Speak LANCETS 42A MISC Check blood sugar once daily (Patient not taking: Reported on 06/16/2020)  . PROAIR HFA 108 (90 Base) MCG/ACT inhaler INHALE 2 PUFFS INTO THE LUNGS EVERY 6 HOURS AS NEEDED FOR WHEEZING OR SHORTNESS OF BREATH  . rosuvastatin (CRESTOR) 20 MG tablet Take 1 tablet (20 mg total) by mouth daily.   No facility-administered encounter medications on file as of 08/17/2020.    Review of patient status, including review of consultants reports, relevant laboratory and other test results, and collaboration with appropriate care team members and the patient's provider was performed as part of comprehensive patient evaluation and provision of chronic care management services.       Information about Care Management services was shared with [name] today including:  1. Care Management services include personalized support from designated clinical staff supervised by her physician, including individualized plan of care and coordination with other care providers 2. Remind patient of 24/7 contact phone numbers to provider's office for assistance with urgent and routine care needs. 3. Care Management services are voluntary and patient may stop at any time .   Patient agreed to services provided today and verbal consent obtained.

## 2020-08-22 ENCOUNTER — Other Ambulatory Visit: Payer: Self-pay | Admitting: Occupational Therapy

## 2020-08-23 ENCOUNTER — Telehealth: Payer: Self-pay

## 2020-08-23 NOTE — Telephone Encounter (Signed)
  Care Management     RN Outreach Note  08/23/2020 Name: [name] MRN: [id number] DOB: [birth year]  [name] is a 75 y.o. year old female who is a primary care patient of [name], Delorse Limber, [place] received a call from Tomasa Rand RN regarding  DYAN LABARBERA today by phone to discuss if  has made a decision about the [place] discussed the information with my co-worker [name] LCSW and she will call Estill Bamberg and review the information.  She also inquired about information about Mrs. Winbush's motorized wheelchair.  RNCM Contacted Andria Rhein by staff message with [employer] and she states that she will reached out to her team requesting internal operations be performed and once completed she will have forms to fax for MD signature. Once they are signed we will submit paperwork to patient's insurance for prior authorization.         Follow Up Plan: RNCM will follow up with the patient at scheduled interval.  [name] RN, BSN, [facility] Care Management Coordinator [place] Phone: [phone] I Fax: [phone]

## 2020-08-23 NOTE — Patient Instructions (Signed)
Goals Addressed            This Visit's Progress   . Patient Stated   On track    She wants to be able to get in and out of her house on her own safely (new flooring (vinyl plank)on the inside of house, ramp and sidewalk would help with this)    . Patient Stated   On track    She would like to have a functional bathroom she can access by wheelchair. (wider door at bathroom, working toilet--regular height round, walk or perhaps roll in shower, grab bars, hand held shower)    . Patient Stated   On track    She would like to be able to do her own laundry at home (instead of her grandsons having to go to laundry mat for her). Need washer and dryer looked at to see what is needed to make this happen.    . Patient Stated   On track    She would like to feel safe bending forward to pick things up from her wheelchair (seat belt, reacher,  dressing stick)  ACTION PLANNING - CUSTOM  Target Problem Area: Keep from falling on the floor  Why Problem May Occur: Client has both legs amputated about the knees so leaning forward from her wheelchair really increases the chance of falling out of her wheelchair because she does not have a seatbelt on her manual wheelchair.     Target Goal: No falls out of wheelchair   STRATEGIES Modifying your home environment and making it safe: DO: DON'T:  Always use your seat belt on your wheelchair when you are sitting in it. Lean forward in your wheelchair without seatbelt on.  Use your reacher to pick up items if you can instead of bending over.             Practice It is important to practice the strategies so we can determine if they will be effective in helping to reach your goal. Follow these specific recommendations: 1. Use your seat belt at all times unless transfer from surface to surface; especially if you are going down an incline or over a bump. 2. Use your reacher to pick up things off floor instead of bending over to get them if at all  possible. If a strategy does not work the first time, try it again and again (and maybe again). We may make some changes over the next few sessions, based on how they work.   Golden Circle, OTR/L     07/05/2020

## 2020-08-23 NOTE — Patient Outreach (Signed)
Aging Gracefully Program  OT Initial Visit  08/23/2020  CODA FILLER 10-12-1945 [id number]  Visit:  3- Third Visit  Start Time:  1330 End Time:  7124 Total Minutes:  120   Readiness To Change Score:  Readiness to Change Score: 10  Patient Education: Education Provided: Yes Education Details: shower transfer (initiated,still in process), use of stove (initiated, still in process), use of washing machine (initiated, still in process), toilet transfers in bathroom (initated, still in process) Person(s) Educated: Patient Comprehension: Verbal Cues Required,Need Further Instruction  Goals: Goals Addressed            This Visit's Progress   . Patient Stated   On track    She wants to be able to get in and out of her house on her own safely (new flooring (vinyl plank)on the inside of house, ramp and sidewalk would help with this)    . Patient Stated   On track    She would like to have a functional bathroom she can access by wheelchair. (wider door at bathroom, working toilet--regular height round, walk or perhaps roll in shower, grab bars, hand held shower)    . Patient Stated   On track    She would like to be able to do her own laundry at home (instead of her grandsons having to go to laundry mat for her). Need washer and dryer looked at to see what is needed to make this happen.    . Patient Stated   On track    She would like to feel safe bending forward to pick things up from her wheelchair (seat belt, reacher,  dressing stick)  ACTION PLANNING - CUSTOM  Target Problem Area: Keep from falling on the floor  Why Problem May Occur: Client has both legs amputated about the knees so leaning forward from her wheelchair really increases the chance of falling out of her wheelchair because she does not have a seatbelt on her manual wheelchair.     Target Goal: No falls out of wheelchair   STRATEGIES Modifying your home environment and making it safe: DO: DON'T:  Always  use your seat belt on your wheelchair when you are sitting in it. Lean forward in your wheelchair without seatbelt on.  Use your reacher to pick up items if you can instead of bending over.             Practice It is important to practice the strategies so we can determine if they will be effective in helping to reach your goal. Follow these specific recommendations: 1. Use your seat belt at all times unless transfer from surface to surface; especially if you are going down an incline or over a bump. 2. Use your reacher to pick up things off floor instead of bending over to get them if at all possible. If a strategy does not work the first time, try it again and again (and maybe again). We may make some changes over the next few sessions, based on how they work.   Golden Circle, OTR/L     07/05/2020         Post Clinical Reasoning: Client Action (Goal) One Interventions: Getting in and out of her house. Pt practiced some of this, but needs to continue Did Client Try?: Yes Targeted Problem Area Status: A Lot Better Client Action (Goal) Two Interventions: Functional bathroom--sink is functional, working on toilet and shower. Did Client Try?: Yes Targeted Problem Area Status: A Little Better Client  Action (Goal) Three Interventions: Laundry--has widened laundry room door and new washing machine--needs new dryer. Did Client Try?: No Reason Client Did Not Try?: Other (waiting on dryer) Clinician View Of Client Situation:: Ms. Lish came back to her house today with all home modifications completed. There are alot of new things for her to learn and a few other modifications that need to be taken care of based off of practicing some tasks today. She was able to get on and off the shower bench; but cannot access the water from there (current recommendation is for her to have a family member that can turn water on and off for her). WIth new oven/stove top she is having trouble seeing the marker  on the knobs to know where to turn them (the whole knob is black)--will have to use contrasting color to mark this for her. Also she is having some diffculty getting the knobs to turn due to her hands--will further look into this. In addition it is somehat difficult for her to push buttons on stove--will look into alternate ways. Client View Of His/Her Situation:: Ms. Dufner was so excited to get back to her home and was amazed at how it looked compared to when she left it. It truly was a whole house transformation. She just kept saying she could not believe it and that we were all her angels. Next Visit Plan:: Continue to work on shower transfer and other home tasks with her.

## 2020-08-24 ENCOUNTER — Other Ambulatory Visit: Payer: Self-pay

## 2020-08-24 ENCOUNTER — Other Ambulatory Visit: Payer: Self-pay | Admitting: Occupational Therapy

## 2020-08-24 NOTE — Patient Outreach (Signed)
Aging Gracefully Program  08/24/2020  [name] 27-Oct-1945 [id number]   Care Coordination:  Spoke with [name] ( LCSW) with MD office regarding PACE, PCS and available resources for in home care.  Reviewed with [name] that patient has moved back into her home and is in need for home assistance.  I was informed that patient has decided she is no longer interested in PACE.    PLAN: will plan to see patient for home visit next week.   Tomasa Rand, RN, BSN, CEN [facility] [name] [phone]

## 2020-08-24 NOTE — Patient Outreach (Signed)
[place]  OT Follow-Up Visit  08/24/2020  [name] 1946-01-15 [id number]  Visit:  4- Fourth Visit  Start Time:  1615 End Time:  4580 Total Minutes:  125    Readiness to Change Score :  Readiness to Change Score: 10   Patient Education: Education Provided: Yes Education Details: shower transfer and showering, use oven and Soil scientist) Educated: Patient Comprehension: Need Further Instruction  Goals:  Goals Addressed            This Visit's Progress   . Patient Stated   On track    She would like to have a functional bathroom she can access by wheelchair. (wider door at bathroom, working toilet--regular height round, walk or perhaps roll in shower, grab bars, hand held shower)       Post Clinical Reasoning: Client Action (Goal) Two Interventions: Pt did a shower transfer from her wheelchair to the built in shower seat with min guard A for safety, safety belt applied (needed A) to make sure pt did not slide off shower seat--need to look at possibility of finding a way to have safety belt at chest height instead of hip height (this makes it hard for her to wash her peri areass), pt able to shower with S-min guard A (managed hand held shower, washcloth and soap--needed assist for shampoo bottle. Needs A for turning water on and off (due to where controls are), she can manage hand held shower once handed to her. Did Client Try?: Yes Targeted Problem Area Status: A Little Better Clinician View Of Client Situation:: [name] is happy to be back in her home and is still amazed at how it looks. She is managing to get around in the kitchen, livingroom (her bed room), extra room closet to living room, and bathroom. We need to continue to work on pt's safety with showering--and she agrees not to shower unless someone is helping her. We looked at the toilet situtation and putting her 3n1 over the toilet and she agrees this is not the best thing for her and her  grandsons because they will have to move the toilet seat anytime they have to use the bathroom and put it back for her as well as it will have to be moved anytime she gets in the shower. She is okay with having her 3n1 set up in the extra bedroom nearest to the livingroom. We went over the use of the stove top again and I marked the knobs with white appliance paint so she can see where to turn them, I also did this with toaster oven and toaster. She demonstrated she could use toaster oven with --need to continue to practice. Client View Of His/Her Situation:: Kari. Keller is happy to be back home and is getting use to her new layout. She is excited about all the modifications. She realizes there are still things she needs to learn about the house, practice more and needs A with. Her grandsons did set up a tv for her and they made a place at the window where she can sit and look out. Next Visit Plan:: Continue to work on shower safety/independence, kitchen safety/independence.

## 2020-08-25 ENCOUNTER — Ambulatory Visit: Payer: Medicare Other | Admitting: Licensed Clinical Social Worker

## 2020-08-25 DIAGNOSIS — Z7189 Other specified counseling: Secondary | ICD-10-CM

## 2020-08-25 NOTE — Chronic Care Management (AMB) (Signed)
Care Management   Clinical Social Work Follow UpNote  08/25/2020 Name: [name] MRN: [id number] DOB: 01/30/1946 [name] is a 75 y.o. year old female who sees [name], Kari Limber, MD for primary care.  Patient is enrolled in a Managed Medicaid plan: No.  Engaged with patient by telephone today in response to provider referral for social work case management and/or care coordination services. See care plan below for details during this encounter. Follow up Plan:CCM will f/u with patient in 1 week CCM RN and 2 weeks CCM SW  Advanced Directives Status:Not addressed in this encounter.     SDOH (Social Determinants of Health) assessments performed: Yes ; She continues to receive Meals on Wheels    Patient Care Plan: Clinical Social Work  Problem Identified: Limited support in the home   Long-Range Goal: patient will explore options with the PACE program for additional support in the home   Start Date: 06/02/2020  Expected End Date: 08/18/2020  This Visit's Progress: Not on track  Priority: High  Assessment, progress & current barriers: Patient is excited to be back in her home.  Previously did not want to move forward with PACE as she did not want to leave provider's office.  Today states she will look into PACE after she gets her electric wheelchair. . Patient unable to consistently perform activities of daily living . needs additional assistance in the home in order to meet this unmet need . Wheelchair bound . Limited support at home Interventions : . Assessed needs, level of care concerns, basic eligibility and provided education on PACE . Motivational interviewing to address ambivalence of moving forward with PACE  . Referral previously placed and collaboration with social worker at the [place] with PACE program about moving forward with intake progress and getting forms for patient to sign . Collaborated with [name] on PACE progress and patient's  reservations about moving forward.   Marland Kitchen PACE intake worker in [place] [phone]  called [name] to get  an updated on PACE application ( report patient has not returned any of her calls asked if she would reach out to patient again) . Provided patient with contact number to call [name] to discuss PACE and benefits of participating in the program . Collaborated with CCM RN for ongoing need . Other interventions provided: Motivational Interviewing, Solution-Focused Strategies, Emotional/Supportive Counseling, and Problem Solving Patient Goals/Self-Care Activities: Over the next 14 days . Call [name] [phone] to discuss PACE program . Sign required forms to start intake process with PACE Follow Up Plan: CCM team will f/u in 1 to 2 weeks      Outpatient Encounter Medications as of 08/25/2020  Medication Sig  . acetaminophen (TYLENOL) 325 MG tablet Take 2 tablets (650 mg total) by mouth every 6 (six) hours.  . Blood Glucose Monitoring Suppl (ONETOUCH VERIO) w/Device KIT Check sugar 3 times per day (Patient not taking: Reported on 06/16/2020)  . feeding supplement, ENSURE ENLIVE, (ENSURE ENLIVE) LIQD Take 237 mLs by mouth 3 (three) times daily between meals.  . ferrous sulfate 324 MG TBEC Take 1 tablet (324 mg total) by mouth daily with breakfast.  . furosemide (LASIX) 40 MG tablet Take 1 tablet (40 mg total) by mouth daily.  Marland Kitchen gabapentin (NEURONTIN) 100 MG capsule Take 1 capsule (100 mg total) by mouth every 8 (eight) hours. (Patient not taking: Reported on 06/16/2020)  . glucose blood (ONETOUCH VERIO) test strip Check blood sugar daily (Patient not  taking: Reported on 06/16/2020)  . Incontinence Supply Disposable (INCONTINENCE BRIEF MEDIUM) MISC Wear as needed for incontinence  . Lancet Devices (MICROLET NEXT LANCING DEVICE) MISC 1 each by Does not apply route 3 (three) times daily. Check blood sugar 3 times daily (Patient not taking: Reported on 06/16/2020)  . levothyroxine  (SYNTHROID) 150 MCG tablet Take 1 tablet (150 mcg total) by mouth daily.  Marland Kitchen lidocaine (LIDODERM) 5 % Apply one patch to each lower limbs for 12 hours a day as needed for phantom pain. Remove after 12 hours or as directed by MD  . Jonetta Speak LANCETS 09T MISC Check blood sugar once daily (Patient not taking: Reported on 06/16/2020)  . PROAIR HFA 108 (90 Base) MCG/ACT inhaler INHALE 2 PUFFS INTO THE LUNGS EVERY 6 HOURS AS NEEDED FOR WHEEZING OR SHORTNESS OF BREATH  . rosuvastatin (CRESTOR) 20 MG tablet Take 1 tablet (20 mg total) by mouth daily.   No facility-administered encounter medications on file as of 08/25/2020.   Review of patient status, including review of consultants reports, relevant laboratory and other test results, and collaboration with appropriate care team members and the patient's provider was performed as part of comprehensive patient evaluation and provision of chronic care management services.       Information about Care Management services was shared with [name] today including:  1. Care Management services include personalized support from designated clinical staff supervised by her physician, including individualized plan of care and coordination with other care providers 2. Remind patient of 24/7 contact phone numbers to provider's office for assistance with urgent and routine care needs. 3. Care Management services are voluntary and patient may stop at any time .   Patient agreed to services provided today and verbal consent obtained.     [name], [place] / [place]   [phone] 11:21 AM

## 2020-08-25 NOTE — Chronic Care Management (AMB) (Signed)
   Social Work  Care Management Collaboration 08/25/2020 Name: [name] MRN: [id number] DOB: 18-Oct-1945 [name] is a 75 y.o. year old female who sees [name], Delorse Limber, MD for primary care.  LCSW was contacted by [facility] PACE Worker to provide an update after talking with patient today. Per Noemi  she discussed options and patient is interested in PACE, monthly fee will be around $300  per month. The last time PACE worker spoke with patient did not want to change her [place] or leave PCP's office. Patient shared with PACE worker that she is in the process of getting a powered Wheel Chair and does not want to change until she gets her chair.  Recommendation: After talking with PACE worker it is determined that patient may benefit by continuing with [facility] until she gets the power chair. Per Noemi often a change in insurance could slow down the process or prevent the patient from getting their equipment.    Intervention: Patient was not interviewed or contacted during this encounter.  LCSW collaborated with [employer].  Review of patient status, including review of consultants reports, relevant laboratory and other test results, and collaboration with appropriate care team members and the patient's provider was performed as part of comprehensive patient evaluation and provision of chronic care management services.    Plan: PACE Worker Noemi [phone] will f/U with patient.  LCSW will continue to collaborate with CCM RN for updates on the wheelchair and ongoing support.       [name], [place] / [place]   [phone] 2:23 PM

## 2020-08-25 NOTE — Patient Instructions (Signed)
  Ms. Elsbury  it was nice speaking with you. Please call me directly [phone] if you have questions about the goals we discussed. Goals Addressed            This Visit's Progress   . Additional Support in the Home       Patient Goals/Self-Care Activities:Thank you for being willing to explore options with the PACE program  . Call Johney Frame [phone] to discuss PACE program . Sign required forms to start intake process with PACE      Ms. Plasencia received Care Management services today:  1. Care Management services include personalized support from designated clinical staff supervised by her physician, including individualized plan of care and coordination with other care providers 2. 24/7 contact [phone] for assistance for urgent and routine care needs. 3. Care Management are voluntary services and be declined at any time by calling the office.  Patient verbalizes understanding of instructions provided today.    Follow up plan: SW will follow up with patient by phone over the next 2 weeks  Maurine Cane, LCSW

## 2020-08-30 ENCOUNTER — Ambulatory Visit: Payer: Medicare Other

## 2020-08-30 NOTE — Chronic Care Management (AMB) (Signed)
Care Management    RN Visit Note  08/30/2020 Name: [name] MRN: [id number] DOB: June 24, 1946  Subjective: [name] is a 75 y.o. year old female who is a primary care patient of [name], MD. The care management team was consulted for assistance with disease management and care coordination needs.    Engaged with patient by telephone for follow up visit in response to provider referral for case management and/or care coordination services.   Consent to Services:   Ms. Skolnick was given information about Care Management services today including:  1. Care Management services includes personalized support from designated clinical staff supervised by her physician, including individualized plan of care and coordination with other care providers 2. 24/7 contact phone numbers for assistance for urgent and routine care needs. 3. The patient may stop case management services at any time by phone call to the office staff.  Patient agreed to services and consent obtained.   Assessment/Interventions: Review of patient past medical history, allergies, medications, health status, including review of consultants reports, laboratory and other test data, was performed as part of comprehensive evaluation and provision of chronic care management services.   SDOH (Social Determinants of Health) assessments and interventions performed:    Care Plan  Allergies  Allergen Reactions  . Feraheme [Ferumoxytol] Itching  . Rosiglitazone Maleate Other (See Comments)    REACTION: Difficulty walking, Fatigue, shortness of breath    Outpatient Encounter Medications as of 08/30/2020  Medication Sig  . acetaminophen (TYLENOL) 325 MG tablet Take 2 tablets (650 mg total) by mouth every 6 (six) hours.  . feeding supplement, ENSURE ENLIVE, (ENSURE ENLIVE) LIQD Take 237 mLs by mouth 3 (three) times daily between meals.  . ferrous sulfate 324 MG TBEC Take 1 tablet (324 mg total) by mouth daily with  breakfast.  . furosemide (LASIX) 40 MG tablet Take 1 tablet (40 mg total) by mouth daily.  Marland Kitchen gabapentin (NEURONTIN) 100 MG capsule Take 1 capsule (100 mg total) by mouth every 8 (eight) hours.  . Incontinence Supply Disposable (INCONTINENCE BRIEF MEDIUM) MISC Wear as needed for incontinence  . levothyroxine (SYNTHROID) 150 MCG tablet Take 1 tablet (150 mcg total) by mouth daily.  Marland Kitchen lidocaine (LIDODERM) 5 % Apply one patch to each lower limbs for 12 hours a day as needed for phantom pain. Remove after 12 hours or as directed by MD  . PROAIR HFA 108 (90 Base) MCG/ACT inhaler INHALE 2 PUFFS INTO THE LUNGS EVERY 6 HOURS AS NEEDED FOR WHEEZING OR SHORTNESS OF BREATH  . rosuvastatin (CRESTOR) 20 MG tablet Take 1 tablet (20 mg total) by mouth daily.  . Blood Glucose Monitoring Suppl (ONETOUCH VERIO) w/Device KIT Check sugar 3 times per day (Patient not taking: Reported on 06/16/2020)  . glucose blood (ONETOUCH VERIO) test strip Check blood sugar daily (Patient not taking: Reported on 06/16/2020)  . Lancet Devices (MICROLET NEXT LANCING DEVICE) MISC 1 each by Does not apply route 3 (three) times daily. Check blood sugar 3 times daily (Patient not taking: Reported on 06/16/2020)  . ONETOUCH DELICA LANCETS 63O MISC Check blood sugar once daily (Patient not taking: Reported on 06/16/2020)   No facility-administered encounter medications on file as of 08/30/2020.    Patient Active Problem List   Diagnosis Date Noted  . Controlled type 2 diabetes mellitus with stable proliferative retinopathy of both eyes, with long-term current use of insulin ([place]) 08/07/2020  . Pseudophakia of both eyes 08/07/2020  . At high  risk for pressure injury of skin 12/16/2019  . Uncontrolled type 2 diabetes mellitus with hyperglycemia ([place]) 11/12/2019  . S/P AKA (above knee amputation) bilateral ([place]) 11/12/2019  . Food insecurity 11/12/2019  . Wound infection   . Critical lower limb ischemia ([place])   . Moderate protein-calorie  malnutrition ([place])   . Osteomyelitis ([place]) 09/23/2019  . Left breast mass 06/02/2019  . Stress 06/02/2019  . Peripheral vascular disease ([place]) 07/22/2018  . Dyspnea 03/23/2018  . Impaired functional mobility, balance, gait, and endurance 03/02/2018    Class: Diagnosis of  . Abnormality of gait 02/26/2018  . Iron deficiency anemia 03/19/2017  . Vision disturbance 03/20/2015  . Memory difficulty 03/20/2015  . At high risk for falls 11/10/2012  . DIABETIC  RETINOPATHY 03/17/2007  . Type 2 diabetes mellitus with diabetic neuropathy, unspecified ([place]) 03/17/2007  . Hypothyroidism 10/16/2006  . Type 2 diabetes mellitus ([place]) 10/16/2006  . OBESITY, NOS 10/16/2006  . HYPERTENSION, BENIGN SYSTEMIC 10/16/2006  . GASTROESOPHAGEAL REFLUX, NO ESOPHAGITIS 10/16/2006  . Rheumatoid arthritis ([place]) 10/16/2006  . Disorder of bone and cartilage 10/16/2006  . INCONTINENCE, URGE 10/16/2006    Conditions to be addressed/monitored: bilateral below the knee amputation, Iron deficiency anemia,  Care Plan : RN Case Manager  Updates made by [name], RN since 08/30/2020 12:00 AM  Problem: Quality of Life  Maintained   Priority: Medium  Onset Date: 07/05/2020  Current Barriers:  . Chronic Disease Management support and education needs/crisis intervention in patient with bilateral below the knee amputation, Iron deficiency anemia, and community resources/urgent housing crisis  . Knowledge Deficits related to imbalanced nutrition less than body requirements as evidenced by Moderate protein-calorie malnutrition  Nurse Case Manager Clinical Goal(s):  Marland Kitchen Over the next 90 days, patient will verbalize understanding of plan   Interventions:  . Evaluation of current treatment plan related patient's adherence to plan as established by provider. . Provided education to patient re: nutrition and wound healing . Collaborated with SW regarding transportation- patient states that she has scat transportation   . Discussed plans with patient for ongoing care management follow up and provided patient with direct contact information for care management team . Spoke with the patient today and she is doing well.  She is back in the home.  She has her 2 grandsons living with her.  She states that the grandsons help her with getting her groceries, taking her laundry to the laundry,and taking the trash out.  The patient states that she has the nurse coming to the home 1x/week and PT/OT will start back soon. Marland Kitchen expression of thoughts about present/future encouraged . independence in all possible areas promoted . life review by storytelling encouraged . patient strengths promoted . social relationships promoted . wellness behaviors promoted- She is trying to stay more independent is all areas in her life. . The patient states that  Etta Quill from [place]  spoke with her and she would be able to have an electric wheelchair with them for no cost.  RNCM will cal and check into this .  The Patient also has an account with [employer] for an electric wheelchair at this time as will. . The patient is calling to renew her medications and check on a Rx for Verio glucometer and Briefs that was sent in and will let me know.  Patient Goals/Self Care Activities:  . Patient verbalizes understanding of plan  . Attends all scheduled provider appointments . Calls provider office for new concerns or  questions . Encouraged patient to write down information when she has appointments for follow up . tell my story and reason for my visit . make a list of questions . ask questions . repeat what I heard to make sure I understand . bring a list of my medicines to the visit . speak up when I don't understand  Follow up Plan: The care management team will reach out to the patient again over the next 14 days and the patient agreed to follow up.        [name] RN, BSN, [facility] Care Management Coordinator [place] Phone: [phone] Fax: [phone]

## 2020-08-30 NOTE — Patient Instructions (Signed)
[name]  it was nice speaking with you. Please call me directly [phone] if you have questions about the goals we discussed.  Goals Addressed              This Visit's Progress   .  " I am confused with having my legs amputatated and trying to cope" (pt-stated)         Timeframe:  Long-Range Goal Priority:  Medium Start Date:       11/13/19                      Expected End Date:       12/16/20                 Patient Goals/Self Care Activities:  . Patient verbalizes understanding of plan  . Attends all scheduled provider appointments . Calls provider office for new concerns or questions . Encouraged patient to write down information when she has appointments for follow up . tell my story and reason for my visit . make a list of questions . ask questions . repeat what I heard to make sure I understand . bring a list of my medicines to the visit . speak up when I don't understand       Patient Care Plan: RN Case Manager  Problem Identified: Quality of Life  Maintained   Priority: Medium  Onset Date: 07/05/2020  Current Barriers:  . Chronic Disease Management support and education needs/crisis intervention in patient with bilateral below the knee amputation, Iron deficiency anemia, and community resources/urgent housing crisis  . Knowledge Deficits related to imbalanced nutrition less than body requirements as evidenced by Moderate protein-calorie malnutrition  Nurse Case Manager Clinical Goal(s):  Marland Kitchen Over the next 90 days, patient will verbalize understanding of plan   Interventions:  . Evaluation of current treatment plan related patient's adherence to plan as established by provider. . Provided education to patient re: nutrition and wound healing . Collaborated with SW regarding transportation- patient states that she has scat transportation  . Discussed plans with patient for ongoing care management follow up and provided patient with direct contact  information for care management team . Spoke with the patient today and she is doing well.  She is back in the home.  She has her 2 grandsons living with her.  She states that the grandsons help her with getting her groceries, taking her laundry to the laundry,and taking the trash out.  The patient states that she has the nurse coming to the home 1x/week and PT/OT will start back soon. Marland Kitchen expression of thoughts about present/future encouraged . independence in all possible areas promoted . life review by storytelling encouraged . patient strengths promoted . social relationships promoted . wellness behaviors promoted- She is trying to stay more independent is all areas in her life. . The patient states that  [name] from [place]  spoke with her and she would be able to have an electric wheelchair with them for no cost.  RNCM will cal and check into this .  The Patient also has an account with [employer] for an electric wheelchair at this time as will. . The patient is calling to renew her medications and check on a Rx for Verio glucometer and Briefs that was sent in and will let me know.  Patient Goals/Self Care Activities:  . Patient verbalizes understanding of plan  . Attends all scheduled provider appointments . Calls  provider office for new concerns or questions . Encouraged patient to write down information when she has appointments for follow up . tell my story and reason for my visit . make a list of questions . ask questions . repeat what I heard to make sure I understand . bring a list of my medicines to the visit . speak up when I don't understand  Follow up Plan: The care management team will reach out to the patient again over the next 14 days and patient agreed to follow up..        [name] received Care Management services today:  1. Care Management services include personalized support from designated clinical staff supervised by her physician, including  individualized plan of care and coordination with other care providers 2. 24/7 contact [phone] for assistance for urgent and routine care needs. 3. Care Management are voluntary services and be declined at any time by calling the office.  The patient verbalized understanding of instructions provided today and declined a print copy of patient instruction materials.     [name], RN

## 2020-09-01 ENCOUNTER — Other Ambulatory Visit: Payer: Self-pay

## 2020-09-01 NOTE — Patient Outreach (Signed)
Aging Gracefully Program  RN Visit  09/01/2020  [name] 1946-06-17 [id number]  Visit:   RN home visit #2  Start Time:   1100 End Time:   1200 Total Minutes:   60  Readiness To Change Score:     Universal RN Interventions: Calendar Distribution: Yes Exercise Review: Yes Medications: Yes Medication Changes: Yes Mood: Yes Pain: Yes PCP Advocacy/Support: Yes Fall Prevention: Yes Incontinence: Yes Clinician View Of Client Situation: Patient now moved back into renovated home.  Grandson home during visit. New puppy. Patient is sitting in her chair upon my arrival in [employer]. no seat belt in use for wheelchair. Patient unable to lock lever lock for front door. Able to lock and unlock deadbolt.  Provided medication pill planner boxes. Patient able to open pill boxes.  Ramps noted in front of home. Client View Of His/Her Situation: the door frame and puling herself in to use the bathroom.  Reports she is very happy with all modifications in the home. Reports she has been able to clean with water and paper towels. reports swiffer is too heavy with a bottle.  Reports she has been able to Jaegar Croft some and use her kitchen sink.  Reports difficutly with front door lock.  Reports not able to turn lock in lever. Able to turn deadbolt.  Healthcare Provider Communication: Did Higher education careers adviser With [employer] Provider?: Yes Method Of Communication: Telephone Healthcare Provider Response According to RN: spoke with [facility]  Clinician View of Client Situation: Clinician View Of Client Situation: Patient now moved back into renovated home.  Grandson home during visit. New puppy. Patient is sitting in her chair upon my arrival in [employer]. no seat belt in use for wheelchair. Patient unable to lock lever lock for front door. Able to lock and unlock deadbolt.  Provided medication pill planner boxes. Patient able to open pill boxes.  Ramps noted in front of home. Client's View of His/Her  Situation: Client View Of His/Her Situation: the door frame and puling herself in to use the bathroom.  Reports she is very happy with all modifications in the home. Reports she has been able to clean with water and paper towels. reports swiffer is too heavy with a bottle.  Reports she has been able to Debbora Ang some and use her kitchen sink.  Reports difficutly with front door lock.  Reports not able to turn lock in lever. Able to turn deadbolt.  Medication Assessment: no changes in medications. Provided 2 different pill planner boxes for patient use. Patient able to self demonstrate opening and closing of boxes.     OT Update: Reviewed additional concerns with Occupational therapist via phone.    Session Summary: Doing very well with home modifications. Home exercises provided and demonstrated with patient. Written copies of amputee exercises and AG exercises. Cross out exercises patient is not able to participate in.   Goals Addressed              This Visit's Progress   .  falls (pt-stated)        Aging Gracefully:  Patient will report not falling from wheelchair in the next 3 months.   Interventions: Reviewed with patient the importance of safety. Reviewed use of life alert. Will followup with OT about seat belt or anti- tip options.  Tomasa Rand, RN, BSN, CEN [facility] [phone]   09/01/2020  In home visit. Moved back into home. No falls.  Reviewed need to use seatbelt in wheelchair. Demonstrated  AG exercises and provided written AG exercises and above the knee amputee exercises.  Patient able to demonstrate exercises.   [place]       Next home visit planned for 09/11/2020.    During home visit [facility] called and introduced program. Able to verify with patient it was an honest call to assist her with care. She accepted home visit from [facility] on 09/06/2020. From a nurse named Maudie Mercury.   Placed call to Gene with [employer] to discuss  front door handle lock and leaking toilet.   Reviewed and updated assigned occupational therapist.   Tomasa Rand, RN, BSN, CEN [place] Coordinator [phone]

## 2020-09-02 DIAGNOSIS — I5032 Chronic diastolic (congestive) heart failure: Secondary | ICD-10-CM | POA: Diagnosis not present

## 2020-09-02 DIAGNOSIS — M6281 Muscle weakness (generalized): Secondary | ICD-10-CM | POA: Diagnosis not present

## 2020-09-02 DIAGNOSIS — S78111D Complete traumatic amputation at level between right hip and knee, subsequent encounter: Secondary | ICD-10-CM | POA: Diagnosis not present

## 2020-09-02 DIAGNOSIS — S78112D Complete traumatic amputation at level between left hip and knee, subsequent encounter: Secondary | ICD-10-CM | POA: Diagnosis not present

## 2020-09-05 ENCOUNTER — Other Ambulatory Visit: Payer: Self-pay

## 2020-09-05 ENCOUNTER — Other Ambulatory Visit: Payer: Self-pay | Admitting: Occupational Therapy

## 2020-09-05 ENCOUNTER — Telehealth: Payer: Medicare Other

## 2020-09-05 NOTE — Patient Instructions (Signed)
ACTION PLANNING - MEAL PREP  Target Problem Area: Difficulty with food prep (including opening containers, using appliances, and transporting food)  Why Problem May Occur: Client in wheelchair, new appliances, decreased use of hands (arthritis)  Target Goal: Independence with small meal/snack prep   STRATEGIES Saving Your Energy: DO: DON'T:  Take time to move slowly and deliberately [place]   Modifying your home environment and making it safe: DO: DON'T:  Keep frequently used items in easy to reach places Avoid placing items on high shelves or excessively low surfaces  Keep items accessible with hooks    Provide adequate lighting Use dim lights or lights that cast a lot of shadows  Use wheelchair tray table Don't put hot food on your lap   Simplifying the way you set up tasks or daily routines: DO: DON'T:  Use a rolling cart or caddie to transport items in the kitchen Carry heavy bags, pots/pans, plates across the kitchen  Use new can openers (pop top and electric) Use manual force  Use lightweight items (1/2 gallons of liquids) No full gallons   Practice It is important to practice the strategies so we can determine if they will be effective in helping to reach your goal. Follow these specific recommendations: 1. Use one of the can openers to open cans to make it easier on your hands. 2.Use wheelchair tray anytime you are transporting food. (don't put too much on it)  If a strategy does not work the first time, try it again and again (and maybe again). We may make some changes over the next few sessions, based on how they work.  [place], OTR/L      09/05/2020

## 2020-09-06 ENCOUNTER — Ambulatory Visit: Payer: Medicare Other

## 2020-09-07 NOTE — Patient Instructions (Addendum)
Visit Information  Goals Addressed              This Visit's Progress   .  " I am confused with having my legs amputatated and trying to cope" (pt-stated)         Timeframe:  Long-Range Goal Priority:  Medium Start Date:       11/13/19                      Expected End Date:       12/16/20                Patient Goals/Self Care Activities:  . Patient verbalizes understanding of plan  . Attends all scheduled provider appointments . Calls provider office for new concerns or questions . tell my story and reason for my visit . make a list of questions . ask questions . repeat what I heard to make sure I understand . bring a list of my medicines to the visit . speak up when I don't understand       The patient verbalized understanding of instructions, educational materials, and care plan provided today and declined offer to receive copy of patient instructions, educational materials, and care plan.   Follow up Plan: Patient would like continued follow-up.  CCM RNCM will outreach the patient within the next 21 days. Patient will call office if needed prior to next encounter   [name] RN, BSN, [facility] Care Management Coordinator [place] Phone: [phone] I Fax: [phone]

## 2020-09-07 NOTE — Chronic Care Management (AMB) (Signed)
Care Management    RN Visit Note  09/07/2020 Name: [name] MRN: [id number] DOB: [birth year]  Subjective: [name] is a 75 y.o. year old female who is a primary care patient of Kari Mems Delorse Limber, MD. The care management team was consulted for assistance with disease management and care coordination needs.    Engaged with patient by telephone for follow up visit in response to provider referral for case management and/or care coordination services.   Consent to Services:   [name] was given information about Care Management services today including:  1. Care Management services includes personalized support from designated clinical staff supervised by her physician, including individualized plan of care and coordination with other care providers 2. 24/7 contact phone numbers for assistance for urgent and routine care needs. 3. The patient may stop case management services at any time by phone call to the office staff.  Patient agreed to services and consent obtained.    Assessment: Patient is making progress with her new in enviorment in the home and will be contact ssoon about her electic wheelchair. . See Care Plan below for interventions and patient self-care actives. Follow up Plan: Patient would like continued follow-up.  CCM RNCM  will outreach the patient within the next 21 day. Patient will call office if needed prior to next encounter  Review of patient past medical history, allergies, medications, health status, including review of consultants reports, laboratory and other test data, was performed as part of comprehensive evaluation and provision of chronic care management services.   SDOH (Social Determinants of Health) assessments and interventions performed:    Care Plan  Allergies  Allergen Reactions  . Feraheme [Ferumoxytol] Itching  . Rosiglitazone Maleate Other (See Comments)    REACTION: Difficulty walking, Fatigue, shortness of breath    Outpatient  Encounter Medications as of 09/06/2020  Medication Sig  . acetaminophen (TYLENOL) 325 MG tablet Take 2 tablets (650 mg total) by mouth every 6 (six) hours.  . Blood Glucose Monitoring Suppl (ONETOUCH VERIO) w/Device KIT Check sugar 3 times per day (Patient not taking: Reported on 06/16/2020)  . feeding supplement, ENSURE ENLIVE, (ENSURE ENLIVE) LIQD Take 237 mLs by mouth 3 (three) times daily between meals.  . ferrous sulfate 324 MG TBEC Take 1 tablet (324 mg total) by mouth daily with breakfast.  . furosemide (LASIX) 40 MG tablet Take 1 tablet (40 mg total) by mouth daily.  Marland Kitchen gabapentin (NEURONTIN) 100 MG capsule Take 1 capsule (100 mg total) by mouth every 8 (eight) hours.  Marland Kitchen glucose blood (ONETOUCH VERIO) test strip Check blood sugar daily (Patient not taking: Reported on 06/16/2020)  . Incontinence Supply Disposable (INCONTINENCE BRIEF MEDIUM) MISC Wear as needed for incontinence  . Lancet Devices (MICROLET NEXT LANCING DEVICE) MISC 1 each by Does not apply route 3 (three) times daily. Check blood sugar 3 times daily (Patient not taking: Reported on 06/16/2020)  . levothyroxine (SYNTHROID) 150 MCG tablet Take 1 tablet (150 mcg total) by mouth daily.  Marland Kitchen lidocaine (LIDODERM) 5 % Apply one patch to each lower limbs for 12 hours a day as needed for phantom pain. Remove after 12 hours or as directed by MD  . Jonetta Speak LANCETS 40J MISC Check blood sugar once daily (Patient not taking: Reported on 06/16/2020)  . PROAIR HFA 108 (90 Base) MCG/ACT inhaler INHALE 2 PUFFS INTO THE LUNGS EVERY 6 HOURS AS NEEDED FOR WHEEZING OR SHORTNESS OF BREATH  . rosuvastatin (CRESTOR) 20 MG tablet  Take 1 tablet (20 mg total) by mouth daily.   No facility-administered encounter medications on file as of 09/06/2020.    Patient Active Problem List   Diagnosis Date Noted  . Controlled type 2 diabetes mellitus with stable proliferative retinopathy of both eyes, with long-term current use of insulin ([place]) 08/07/2020   . Pseudophakia of both eyes 08/07/2020  . At high risk for pressure injury of skin 12/16/2019  . Uncontrolled type 2 diabetes mellitus with hyperglycemia ([place]) 11/12/2019  . S/P AKA (above knee amputation) bilateral ([place]) 11/12/2019  . Food insecurity 11/12/2019  . Wound infection   . Critical lower limb ischemia ([place])   . Moderate protein-calorie malnutrition ([place])   . Osteomyelitis ([place]) 09/23/2019  . Left breast mass 06/02/2019  . Stress 06/02/2019  . Peripheral vascular disease ([place]) 07/22/2018  . Dyspnea 03/23/2018  . Impaired functional mobility, balance, gait, and endurance 03/02/2018    Class: Diagnosis of  . Abnormality of gait 02/26/2018  . Iron deficiency anemia 03/19/2017  . Vision disturbance 03/20/2015  . Memory difficulty 03/20/2015  . At high risk for falls 11/10/2012  . DIABETIC  RETINOPATHY 03/17/2007  . Type 2 diabetes mellitus with diabetic neuropathy, unspecified ([place]) 03/17/2007  . Hypothyroidism 10/16/2006  . Type 2 diabetes mellitus ([place]) 10/16/2006  . OBESITY, NOS 10/16/2006  . HYPERTENSION, BENIGN SYSTEMIC 10/16/2006  . GASTROESOPHAGEAL REFLUX, NO ESOPHAGITIS 10/16/2006  . Rheumatoid arthritis ([place]) 10/16/2006  . Disorder of bone and cartilage 10/16/2006  . INCONTINENCE, URGE 10/16/2006    Conditions to be addressed/monitored: bilateral below the knee amputation, Iron deficiency anemia, and community resources/urgent housing crisis   Care Plan : RN Case Manager  Updates made by [name], RN since 09/07/2020 12:00 AM  Problem: Quality of Life  Maintained   Priority: Medium  Onset Date: 07/05/2020  Current Barriers:  . Chronic Disease Management support and education needs/crisis intervention in patient with bilateral below the knee amputation, Iron deficiency anemia, and community resources/urgent housing crisis  . Knowledge Deficits related to imbalanced nutrition less than body requirements as evidenced by Moderate protein-calorie  malnutrition  Nurse Case Manager Clinical Goal(s):  Marland Kitchen Over the next 90 days, patient will verbalize understanding of plan   Interventions:  . Evaluation of current treatment plan related patient's adherence to plan as established by provider. . Provided education to patient re: nutrition and wound healing . Collaborated with SW regarding transportation- patient states that she has scat transportation  . Discussed plans with patient for ongoing care management follow up and provided patient with direct contact information for care management team . expression of thoughts about present/future encouraged . independence in all possible areas promoted . life review by storytelling encouraged . patient strengths promoted . social relationships promoted . wellness behaviors promoted- Spoke with the patient today and she states that she is doing well.  Informed the patient that Dr Kari Mems contacted me to let me know that she had received the paperwork from [employer] for her electric wheelchair and it has been sign and faxed back.  Someone should be contacting her soon. RNCM advised if she has any questions or concerns to contact me.    Patient Goals/Self Care Activities:  . Patient verbalizes understanding of plan  . Attends all scheduled provider appointments . Calls provider office for new concerns or questions . Encouraged patient to write down information when she has appointments for follow up . tell my story and reason for my visit . make a list of questions .  ask questions . repeat what I heard to make sure I understand . bring a list of my medicines to the visit . speak up when I don't understand          [name] RN, BSN, [facility] Care Management Coordinator [place] Phone: [phone] I Fax: [phone]

## 2020-09-12 DIAGNOSIS — Z89611 Acquired absence of right leg above knee: Secondary | ICD-10-CM | POA: Diagnosis not present

## 2020-09-12 DIAGNOSIS — Z89612 Acquired absence of left leg above knee: Secondary | ICD-10-CM | POA: Diagnosis not present

## 2020-09-12 DIAGNOSIS — I10 Essential (primary) hypertension: Secondary | ICD-10-CM | POA: Diagnosis not present

## 2020-09-12 DIAGNOSIS — M6281 Muscle weakness (generalized): Secondary | ICD-10-CM | POA: Diagnosis not present

## 2020-09-12 DIAGNOSIS — E039 Hypothyroidism, unspecified: Secondary | ICD-10-CM | POA: Diagnosis not present

## 2020-09-12 DIAGNOSIS — M06841 Other specified rheumatoid arthritis, right hand: Secondary | ICD-10-CM | POA: Diagnosis not present

## 2020-09-12 DIAGNOSIS — E785 Hyperlipidemia, unspecified: Secondary | ICD-10-CM | POA: Diagnosis not present

## 2020-09-12 DIAGNOSIS — M06842 Other specified rheumatoid arthritis, left hand: Secondary | ICD-10-CM | POA: Diagnosis not present

## 2020-09-12 DIAGNOSIS — E1165 Type 2 diabetes mellitus with hyperglycemia: Secondary | ICD-10-CM | POA: Diagnosis not present

## 2020-09-13 ENCOUNTER — Telehealth: Payer: Self-pay

## 2020-09-13 DIAGNOSIS — E119 Type 2 diabetes mellitus without complications: Secondary | ICD-10-CM | POA: Diagnosis not present

## 2020-09-13 DIAGNOSIS — Z89612 Acquired absence of left leg above knee: Secondary | ICD-10-CM | POA: Diagnosis not present

## 2020-09-13 DIAGNOSIS — Z0000 Encounter for general adult medical examination without abnormal findings: Secondary | ICD-10-CM | POA: Diagnosis not present

## 2020-09-13 NOTE — Telephone Encounter (Signed)
  Care Management     RN Outreach Note  09/13/2020  Late Entry Name: MURREL FREET MRN: [id number] DOB: [birth year]  [name] is a 75 y.o. year old female who is a primary care patient of [name], Delorse Limber, [place] received a call from  NYEMA HACHEY today by phone to discuss a visit from [facility].  The patient called very confused she had forgotten that she had spoken with the Nurse Practitioner with [facility] on 09-01-20 with Tomasa Rand via virtual visit.  The Physical Therapist had been ordered by the Nurse Practitioner.  While on the speaker phone RNCM spoke with Elmyra from [facility] who was there to evaluate the patient. She stated that she will be sending information about the evaluation to our office and confirmed our fax number. Mrs Goeser felt better after knowing that I was able to confirm information regarding [facility] and PT.    Called [facility] and Caryl Pina clarified that [facility] did referr [facility] for physical Therapy for Mrs Broers.    Follow Up Plan: RNCM will contact Tomasa Rand RN sending a copy of this note.  RNCM will follow up at next scheduled interval.   [name] RN, BSN, [place] Phone: [phone] I Fax: [phone]

## 2020-09-18 DIAGNOSIS — I10 Essential (primary) hypertension: Secondary | ICD-10-CM | POA: Diagnosis not present

## 2020-09-18 DIAGNOSIS — Z89611 Acquired absence of right leg above knee: Secondary | ICD-10-CM | POA: Diagnosis not present

## 2020-09-18 DIAGNOSIS — Z89612 Acquired absence of left leg above knee: Secondary | ICD-10-CM | POA: Diagnosis not present

## 2020-09-18 DIAGNOSIS — E785 Hyperlipidemia, unspecified: Secondary | ICD-10-CM | POA: Diagnosis not present

## 2020-09-18 DIAGNOSIS — M06842 Other specified rheumatoid arthritis, left hand: Secondary | ICD-10-CM | POA: Diagnosis not present

## 2020-09-18 DIAGNOSIS — M06841 Other specified rheumatoid arthritis, right hand: Secondary | ICD-10-CM | POA: Diagnosis not present

## 2020-09-18 DIAGNOSIS — E039 Hypothyroidism, unspecified: Secondary | ICD-10-CM | POA: Diagnosis not present

## 2020-09-18 DIAGNOSIS — M6281 Muscle weakness (generalized): Secondary | ICD-10-CM | POA: Diagnosis not present

## 2020-09-18 DIAGNOSIS — E1165 Type 2 diabetes mellitus with hyperglycemia: Secondary | ICD-10-CM | POA: Diagnosis not present

## 2020-09-22 ENCOUNTER — Ambulatory Visit: Payer: Medicare Other

## 2020-09-23 NOTE — Chronic Care Management (AMB) (Signed)
Care Management    RN Visit Note  09/23/2020 Name: [name] MRN: [id number] DOB: 1946-06-23  Subjective: [name] is a 75 y.o. year old female who is a primary care patient of Kari Mems Delorse Limber, MD. The care management team was consulted for assistance with disease management and care coordination needs.    Engaged with patient by telephone for follow up visit in response to provider referral for case management and/or care coordination services.   Consent to Services:   [name] was given information about Care Management services today including:  1. Care Management services includes personalized support from designated clinical staff supervised by her physician, including individualized plan of care and coordination with other care providers 2. 24/7 contact phone numbers for assistance for urgent and routine care needs. 3. The patient may stop case management services at any time by phone call to the office staff.  Patient agreed to services and consent obtained.    Assessment: Patient is making progress with her new home but is having some confusion with changing of services . See Care Plan below for interventions and patient self-care actives. Follow up Plan: Patient would like continued follow-up.  CCM RNCM  will outreach the patient within the next 7 days.. Patient will call office if needed prior to next encounter Review of patient past medical history, allergies, medications, health status, including review of consultants reports, laboratory and other test data, was performed as part of comprehensive evaluation and provision of chronic care management services.   SDOH (Social Determinants of Health) assessments and interventions performed:    Care Plan  Allergies  Allergen Reactions  . Feraheme [Ferumoxytol] Itching  . Rosiglitazone Maleate Other (See Comments)    REACTION: Difficulty walking, Fatigue, shortness of breath    Outpatient Encounter Medications as  of 09/22/2020  Medication Sig  . acetaminophen (TYLENOL) 325 MG tablet Take 2 tablets (650 mg total) by mouth every 6 (six) hours.  . Blood Glucose Monitoring Suppl (ONETOUCH VERIO) w/Device KIT Check sugar 3 times per day (Patient not taking: Reported on 06/16/2020)  . feeding supplement, ENSURE ENLIVE, (ENSURE ENLIVE) LIQD Take 237 mLs by mouth 3 (three) times daily between meals.  . ferrous sulfate 324 MG TBEC Take 1 tablet (324 mg total) by mouth daily with breakfast.  . furosemide (LASIX) 40 MG tablet Take 1 tablet (40 mg total) by mouth daily.  Marland Kitchen gabapentin (NEURONTIN) 100 MG capsule Take 1 capsule (100 mg total) by mouth every 8 (eight) hours.  Marland Kitchen glucose blood (ONETOUCH VERIO) test strip Check blood sugar daily (Patient not taking: Reported on 06/16/2020)  . Incontinence Supply Disposable (INCONTINENCE BRIEF MEDIUM) MISC Wear as needed for incontinence  . Lancet Devices (MICROLET NEXT LANCING DEVICE) MISC 1 each by Does not apply route 3 (three) times daily. Check blood sugar 3 times daily (Patient not taking: Reported on 06/16/2020)  . levothyroxine (SYNTHROID) 150 MCG tablet Take 1 tablet (150 mcg total) by mouth daily.  Marland Kitchen lidocaine (LIDODERM) 5 % Apply one patch to each lower limbs for 12 hours a day as needed for phantom pain. Remove after 12 hours or as directed by MD  . Jonetta Speak LANCETS 65Y MISC Check blood sugar once daily (Patient not taking: Reported on 06/16/2020)  . PROAIR HFA 108 (90 Base) MCG/ACT inhaler INHALE 2 PUFFS INTO THE LUNGS EVERY 6 HOURS AS NEEDED FOR WHEEZING OR SHORTNESS OF BREATH  . rosuvastatin (CRESTOR) 20 MG tablet Take 1 tablet (20 mg  total) by mouth daily.   No facility-administered encounter medications on file as of 09/22/2020.    Patient Active Problem List   Diagnosis Date Noted  . Controlled type 2 diabetes mellitus with stable proliferative retinopathy of both eyes, with long-term current use of insulin ([place]) 08/07/2020  . Pseudophakia of both  eyes 08/07/2020  . At high risk for pressure injury of skin 12/16/2019  . Uncontrolled type 2 diabetes mellitus with hyperglycemia ([place]) 11/12/2019  . S/P AKA (above knee amputation) bilateral ([place]) 11/12/2019  . Food insecurity 11/12/2019  . Wound infection   . Critical lower limb ischemia ([place])   . Moderate protein-calorie malnutrition ([place])   . Osteomyelitis ([place]) 09/23/2019  . Left breast mass 06/02/2019  . Stress 06/02/2019  . Peripheral vascular disease ([place]) 07/22/2018  . Dyspnea 03/23/2018  . Impaired functional mobility, balance, gait, and endurance 03/02/2018    Class: Diagnosis of  . Abnormality of gait 02/26/2018  . Iron deficiency anemia 03/19/2017  . Vision disturbance 03/20/2015  . Memory difficulty 03/20/2015  . At high risk for falls 11/10/2012  . DIABETIC  RETINOPATHY 03/17/2007  . Type 2 diabetes mellitus with diabetic neuropathy, unspecified ([place]) 03/17/2007  . Hypothyroidism 10/16/2006  . Type 2 diabetes mellitus ([place]) 10/16/2006  . OBESITY, NOS 10/16/2006  . HYPERTENSION, BENIGN SYSTEMIC 10/16/2006  . GASTROESOPHAGEAL REFLUX, NO ESOPHAGITIS 10/16/2006  . Rheumatoid arthritis ([place]) 10/16/2006  . Disorder of bone and cartilage 10/16/2006  . INCONTINENCE, URGE 10/16/2006    Conditions to be addressed/monitored: [place] of Life Maintained  Care Plan : RN Case Manager  Updates made by [name], RN since 09/23/2020 12:00 AM  Problem: Quality of Life  Maintained   Priority: Medium  Onset Date: 07/05/2020  Current Barriers:  . Chronic Disease Management support and education needs/crisis intervention in patient with bilateral below the knee amputation, Iron deficiency anemia, and community resources/urgent housing crisis  . Knowledge Deficits related to imbalanced nutrition less than body requirements as evidenced by Moderate protein-calorie malnutrition  Nurse Case Manager Clinical Goal(s):  Marland Kitchen Over the next 90 days, patient will verbalize understanding of  plan   Interventions:  . Evaluation of current treatment plan related patient's adherence to plan as established by provider. . Provided education to patient re: nutrition and wound healing . Collaborated with SW regarding transportation- patient states that she has scat transportation  . Discussed plans with patient for ongoing care management follow up and provided patient with direct contact information for care management team . expression of thoughts about present/future encouraged . independence in all possible areas promoted . life review by storytelling encouraged . patient strengths promoted . social relationships promoted . wellness behaviors promoted- Spoke with the patient today and informed her that all of the paper work for her wheelchair has ben completed and [employer] department is making sure they have everything that is needed before they submit it to insurance.  They will be contacting her soon about the chair. . The patient still has some confusion about who will continue to provide her PT and OT.  RNCM made calls to [facility] to help her understand.    Patient Goals/Self Care Activities:  . Patient verbalizes understanding of plan  . Attends all scheduled provider appointments . Calls provider office for new concerns or questions . Encouraged patient to write down information when she has appointments for follow up . tell my story and reason for my visit . make a list of questions . ask questions .  repeat what I heard to make sure I understand . bring a list of my medicines to the visit . speak up when I don't understand         [name] RN, BSN, [facility] Care Management Coordinator [place] Phone: [phone] I Fax: [phone]

## 2020-09-23 NOTE — Patient Instructions (Signed)
Visit Information  Ms. Jocelyn  it was nice speaking with you. Please call me directly [phone] if you have questions about the goals we discussed.  Goals Addressed              This Visit's Progress   .  " I am confused with having my legs amputatated and trying to cope" (pt-stated)         Timeframe:  Long-Range Goal Priority:  Medium Start Date:       11/13/19                      Expected End Date:       12/15/20                Patient Goals/Self Care Activities:  . Patient verbalizes understanding of plan  . Attends all scheduled provider appointments . Calls provider office for new concerns or questions . tell my story and reason for my visit . make a list of questions . ask questions . repeat what I heard to make sure I understand . speak up when I don't understand       The patient verbalized understanding of instructions, educational materials, and care plan provided today and declined offer to receive copy of patient instructions, educational materials, and care plan.   Follow up Plan: Patient would like continued follow-up.  CCM RNCM . will outreach the patient within the next 7 days. Patient will call office if needed prior to next encounter  [name], RN

## 2020-09-25 ENCOUNTER — Telehealth: Payer: Self-pay

## 2020-09-25 ENCOUNTER — Other Ambulatory Visit: Payer: Self-pay

## 2020-09-25 DIAGNOSIS — Z89612 Acquired absence of left leg above knee: Secondary | ICD-10-CM | POA: Diagnosis not present

## 2020-09-25 DIAGNOSIS — E039 Hypothyroidism, unspecified: Secondary | ICD-10-CM | POA: Diagnosis not present

## 2020-09-25 DIAGNOSIS — I10 Essential (primary) hypertension: Secondary | ICD-10-CM | POA: Diagnosis not present

## 2020-09-25 DIAGNOSIS — E1165 Type 2 diabetes mellitus with hyperglycemia: Secondary | ICD-10-CM | POA: Diagnosis not present

## 2020-09-25 DIAGNOSIS — Z89611 Acquired absence of right leg above knee: Secondary | ICD-10-CM | POA: Diagnosis not present

## 2020-09-25 DIAGNOSIS — E785 Hyperlipidemia, unspecified: Secondary | ICD-10-CM | POA: Diagnosis not present

## 2020-09-25 DIAGNOSIS — M06841 Other specified rheumatoid arthritis, right hand: Secondary | ICD-10-CM | POA: Diagnosis not present

## 2020-09-25 DIAGNOSIS — M6281 Muscle weakness (generalized): Secondary | ICD-10-CM | POA: Diagnosis not present

## 2020-09-25 DIAGNOSIS — M06842 Other specified rheumatoid arthritis, left hand: Secondary | ICD-10-CM | POA: Diagnosis not present

## 2020-09-25 NOTE — Patient Outreach (Signed)
Aging Gracefully Program  09/25/2020  [name] [birth year] [id number]   Care coordination:  Placed call to patient to reschedule missed home visit.  Home visit schedule for 09/27/2020 at [place], RN, BSN, [place] Coordinator [phone]

## 2020-09-25 NOTE — Telephone Encounter (Signed)
  Care Management     RN Outreach Note  09/25/2020 Name: [name] MRN: [id number] DOB: 10-29-1945  [name] is a 75 y.o. year old female who is a primary care patient of [name], Delorse Limber, [place] received a call from [place] with [facility] today by phone to clear up any confusion of duplication of services.  [facility] is going to the home of the patient and providing Physical therapy and Occupational Therapy services. There is no duplication of services.  I spoke with Barnetta Chapel from [employer] and she will be going to the home to see the patient for one more visit but there will not be any conflict of services.  I have given Catherine's cell number to Occupational Therapy at [facility] at her request.     Follow Up Plan: RNCM will follow up with the patient at the next scheduled interval  [name] RN, BSN, [facility] Care Management Coordinator [place] Phone: [phone] I Fax: [phone]

## 2020-09-27 ENCOUNTER — Other Ambulatory Visit: Payer: Self-pay

## 2020-09-27 NOTE — Patient Instructions (Signed)
Goals Addressed              This Visit's Progress   .  falls (pt-stated)        Aging Gracefully:  Patient will report not falling from wheelchair in the next 3 months.   Interventions: Reviewed with patient the importance of safety. Reviewed use of life alert. Will followup with OT about seat belt or anti- tip options.  Tomasa Rand, RN, BSN, CEN [facility] [phone]   09/01/2020  In home visit. Moved back into home. No falls.  Reviewed need to use seatbelt in wheelchair. Demonstrated AG exercises and provided written AG exercises and above the knee amputee exercises.  Patient able to demonstrate exercises.    Interventions:  reviewed home exercise plan and encouraged daily exercises.   09/27/2020   Assessment: doing home exercises daily. Now active with PT. Denies any falls.  Interventions:  Reviewed importance of using seat belt. Encouraged patient to continue to do home exercises. Reviewed safety in the home.   Tomasa Rand, RN, BSN, CEN [facility] [name] [phone]

## 2020-09-27 NOTE — Patient Outreach (Signed)
Aging Gracefully Program  RN Visit  09/27/2020  [name] 05/02/1946 [id number]  Visit:   RN home visit #3  Start Time:   1200 End Time:   1245 Total Minutes:   72 Readiness To Change Score:     Universal RN Interventions: Calendar Distribution: Yes Exercise Review: Yes Medications: Yes Medication Changes: Yes Mood: Yes Pain: Yes PCP Advocacy/Support: Yes Fall Prevention: Yes Incontinence: No Clinician View Of Client Situation: Arrived to find patient sitting in wheelchair. Patient able to answer door herself successfully. Client View Of His/Her Situation: Reports doing well with no falls. Reports cooking for self.  Denies any new problems or concerns today.  Healthcare Provider Communication: Did Higher education careers adviser With [employer] Provider?: Yes (spoke with Kari Arms RN from MD office about update on power chair and PT visits.)  Clinician View of Client Situation: Clinician View Of Client Situation: Arrived to find patient sitting in wheelchair. Patient able to answer door herself successfully. Client's View of His/Her Situation: Client View Of His/Her Situation: Reports doing well with no falls. Reports cooking for self.  Denies any new problems or concerns today.  Medication Assessment:no changes . Using pill planner    OT Update: pending home interview on 10/09/2020, Media consent signed.  Next home visit planned for 10/09/2020.  Session Summary: doing well in remodeled home environment. No new falls.  Goals Addressed              This Visit's Progress   .  falls (pt-stated)        Aging Gracefully:  Patient will report not falling from wheelchair in the next 3 months.   Interventions: Reviewed with patient the importance of safety. Reviewed use of life alert. Will followup with OT about seat belt or anti- tip options.  Kari Rand, RN, BSN, CEN [name] [name] [phone]   09/01/2020  In home visit. Moved back into home. No falls.   Reviewed need to use seatbelt in wheelchair. Demonstrated AG exercises and provided written AG exercises and above the knee amputee exercises.  Patient able to demonstrate exercises.    Interventions:  reviewed home exercise plan and encouraged daily exercises.   09/27/2020   Assessment: doing home exercises daily. Now active with PT. Denies any falls.  Interventions:  Reviewed importance of using seat belt. Encouraged patient to continue to do home exercises. Reviewed safety in the home.   Kari Rand, RN, BSN, CEN [name] [phone]         Kari Rand, RN, BSN, CEN [name] [name] [phone]

## 2020-09-28 DIAGNOSIS — M6281 Muscle weakness (generalized): Secondary | ICD-10-CM | POA: Diagnosis not present

## 2020-09-28 DIAGNOSIS — E039 Hypothyroidism, unspecified: Secondary | ICD-10-CM | POA: Diagnosis not present

## 2020-09-28 DIAGNOSIS — M06841 Other specified rheumatoid arthritis, right hand: Secondary | ICD-10-CM | POA: Diagnosis not present

## 2020-09-28 DIAGNOSIS — E1165 Type 2 diabetes mellitus with hyperglycemia: Secondary | ICD-10-CM | POA: Diagnosis not present

## 2020-09-28 DIAGNOSIS — I10 Essential (primary) hypertension: Secondary | ICD-10-CM | POA: Diagnosis not present

## 2020-09-28 DIAGNOSIS — Z89612 Acquired absence of left leg above knee: Secondary | ICD-10-CM | POA: Diagnosis not present

## 2020-09-28 DIAGNOSIS — E785 Hyperlipidemia, unspecified: Secondary | ICD-10-CM | POA: Diagnosis not present

## 2020-09-28 DIAGNOSIS — Z89611 Acquired absence of right leg above knee: Secondary | ICD-10-CM | POA: Diagnosis not present

## 2020-09-28 DIAGNOSIS — M06842 Other specified rheumatoid arthritis, left hand: Secondary | ICD-10-CM | POA: Diagnosis not present

## 2020-09-29 ENCOUNTER — Ambulatory Visit: Payer: Medicare Other

## 2020-09-30 NOTE — Patient Instructions (Signed)
Visit Information  [name]  it was nice speaking with you. Please call me directly [phone] if you have questions about the goals we discussed.  Goals Addressed              This Visit's Progress   .  " I am confused with having my legs amputatated and trying to cope" (pt-stated)         Timeframe:  Long-Range Goal Priority:  Medium Start Date:       11/13/19                      Expected End Date: 01/16/21       Patient Goals/Self Care Activities:  . Patient verbalizes understanding of plan  . Attends all scheduled provider appointments . Calls provider office for new concerns or questions . tell my story and reason for my visit . make a list of questions . ask questions . repeat what I heard to make sure I understand . speak up when I don't understand       The patient verbalized understanding of instructions, educational materials, and care plan provided today and declined offer to receive copy of patient instructions, educational materials, and care plan.   Follow up Plan: Patient would like continued follow-up.  CCM RNCM will outreach th epatient within the next 14 days. Patient will call office if needed prior to next encounter  [name], RN

## 2020-09-30 NOTE — Chronic Care Management (AMB) (Signed)
Care Management    RN Visit Note  09/30/2020 Name: [name] MRN: [id number] DOB: [birth year]  Subjective: [name] is a 75 y.o. year old female who is a primary care patient of Kari Mems Delorse Limber, MD. The care management team was consulted for assistance with disease management and care coordination needs.    Engaged with patient by telephone for follow up visit in response to provider referral for case management and/or care coordination services.   Consent to Services:   [name] was given information about Care Management services today including:  1. Care Management services includes personalized support from designated clinical staff supervised by her physician, including individualized plan of care and coordination with other care providers 2. 24/7 contact phone numbers for assistance for urgent and routine care needs. 3. The patient may stop case management services at any time by phone call to the office staff.  Patient agreed to services and consent obtained.    Assessment: Patient still has some confusion with agencies that are working with her.. See Care Plan below for interventions and patient self-care actives. Follow up Plan: Patient would like continued follow-up.  CCM RNCM  will will outreach th epatient within the next 14 days.. Patient will call office if needed prior to next encounter  Review of patient past medical history, allergies, medications, health status, including review of consultants reports, laboratory and other test data, was performed as part of comprehensive evaluation and provision of chronic care management services.   SDOH (Social Determinants of Health) assessments and interventions performed:    Care Plan  Allergies  Allergen Reactions  . Feraheme [Ferumoxytol] Itching  . Rosiglitazone Maleate Other (See Comments)    REACTION: Difficulty walking, Fatigue, shortness of breath    Outpatient Encounter Medications as of 09/29/2020   Medication Sig  . acetaminophen (TYLENOL) 325 MG tablet Take 2 tablets (650 mg total) by mouth every 6 (six) hours.  . Blood Glucose Monitoring Suppl (ONETOUCH VERIO) w/Device KIT Check sugar 3 times per day (Patient not taking: Reported on 06/16/2020)  . feeding supplement, ENSURE ENLIVE, (ENSURE ENLIVE) LIQD Take 237 mLs by mouth 3 (three) times daily between meals. (Patient not taking: Reported on 09/27/2020)  . ferrous sulfate 324 MG TBEC Take 1 tablet (324 mg total) by mouth daily with breakfast.  . furosemide (LASIX) 40 MG tablet Take 1 tablet (40 mg total) by mouth daily.  Marland Kitchen gabapentin (NEURONTIN) 100 MG capsule Take 1 capsule (100 mg total) by mouth every 8 (eight) hours.  Marland Kitchen glucose blood (ONETOUCH VERIO) test strip Check blood sugar daily (Patient not taking: Reported on 06/16/2020)  . Incontinence Supply Disposable (INCONTINENCE BRIEF MEDIUM) MISC Wear as needed for incontinence  . Lancet Devices (MICROLET NEXT LANCING DEVICE) MISC 1 each by Does not apply route 3 (three) times daily. Check blood sugar 3 times daily (Patient not taking: Reported on 06/16/2020)  . levothyroxine (SYNTHROID) 150 MCG tablet Take 1 tablet (150 mcg total) by mouth daily.  Marland Kitchen lidocaine (LIDODERM) 5 % Apply one patch to each lower limbs for 12 hours a day as needed for phantom pain. Remove after 12 hours or as directed by MD  . Jonetta Speak LANCETS 28B MISC Check blood sugar once daily (Patient not taking: Reported on 06/16/2020)  . PROAIR HFA 108 (90 Base) MCG/ACT inhaler INHALE 2 PUFFS INTO THE LUNGS EVERY 6 HOURS AS NEEDED FOR WHEEZING OR SHORTNESS OF BREATH  . rosuvastatin (CRESTOR) 20 MG tablet Take 1 tablet (  20 mg total) by mouth daily.   No facility-administered encounter medications on file as of 09/29/2020.    Patient Active Problem List   Diagnosis Date Noted  . Controlled type 2 diabetes mellitus with stable proliferative retinopathy of both eyes, with long-term current use of insulin ([place])  08/07/2020  . Pseudophakia of both eyes 08/07/2020  . At high risk for pressure injury of skin 12/16/2019  . Uncontrolled type 2 diabetes mellitus with hyperglycemia ([place]) 11/12/2019  . S/P AKA (above knee amputation) bilateral ([place]) 11/12/2019  . Food insecurity 11/12/2019  . Wound infection   . Critical lower limb ischemia ([place])   . Moderate protein-calorie malnutrition ([place])   . Osteomyelitis ([place]) 09/23/2019  . Left breast mass 06/02/2019  . Stress 06/02/2019  . Peripheral vascular disease ([place]) 07/22/2018  . Dyspnea 03/23/2018  . Impaired functional mobility, balance, gait, and endurance 03/02/2018    Class: Diagnosis of  . Abnormality of gait 02/26/2018  . Iron deficiency anemia 03/19/2017  . Vision disturbance 03/20/2015  . Memory difficulty 03/20/2015  . At high risk for falls 11/10/2012  . DIABETIC  RETINOPATHY 03/17/2007  . Type 2 diabetes mellitus with diabetic neuropathy, unspecified ([place]) 03/17/2007  . Hypothyroidism 10/16/2006  . Type 2 diabetes mellitus ([place]) 10/16/2006  . OBESITY, NOS 10/16/2006  . HYPERTENSION, BENIGN SYSTEMIC 10/16/2006  . GASTROESOPHAGEAL REFLUX, NO ESOPHAGITIS 10/16/2006  . Rheumatoid arthritis ([place]) 10/16/2006  . Disorder of bone and cartilage 10/16/2006  . INCONTINENCE, URGE 10/16/2006    Conditions to be addressed/monitored: Follow up Quality of life  Care Plan : RN Case Manager  Updates made by [name], RN since 09/30/2020 12:00 AM  Problem: Quality of Life  Maintained   Priority: Medium  Onset Date: 07/05/2020  Note:   Current Barriers:  . Chronic Disease Management support and education needs/crisis intervention in patient with bilateral below the knee amputation, Iron deficiency anemia, and community resources/urgent housing crisis  . Knowledge Deficits related to imbalanced nutrition less than body requirements as evidenced by Moderate protein-calorie malnutrition  Nurse Case Manager Clinical Goal(s):  Marland Kitchen Over the next 90  days, patient will verbalize understanding of plan   Interventions:  . Evaluation of current treatment plan related patient's adherence to plan as established by provider. . Provided education to patient re: nutrition and wound healing . Collaborated with SW regarding transportation- patient states that she has scat transportation  . Discussed plans with patient for ongoing care management follow up and provided patient with direct contact information for care management team . expression of thoughts about present/future encouraged . independence in all possible areas promoted . life review by storytelling encouraged . patient strengths promoted . social relationships promoted . Spoke with the patient today and she has an understanding of who is involved with her OT/PT.  She now describes  that someone is talking with her about a wheelchair.  I advised that [employer] has submitted her paperwork to the insurance company and we should be hearing something soon. The patient reports that she would like for me to speak with the agency that is trying to help with a wheelchair.   Patient Goals/Self Care Activities:  . Patient verbalizes understanding of plan  . Attends all scheduled provider appointments . Calls provider office for new concerns or questions . Encouraged patient to write down information when she has appointments for follow up . tell my story and reason for my visit . make a list of questions . ask questions . repeat  what I heard to make sure I understand . bring a list of my medicines to the visit . speak up when I don't understand         [name] RN, BSN, [facility] Care Management Coordinator [place] Phone: [phone] I Fax: [phone]

## 2020-10-02 DIAGNOSIS — E039 Hypothyroidism, unspecified: Secondary | ICD-10-CM | POA: Diagnosis not present

## 2020-10-02 DIAGNOSIS — M06841 Other specified rheumatoid arthritis, right hand: Secondary | ICD-10-CM | POA: Diagnosis not present

## 2020-10-02 DIAGNOSIS — E1165 Type 2 diabetes mellitus with hyperglycemia: Secondary | ICD-10-CM | POA: Diagnosis not present

## 2020-10-02 DIAGNOSIS — M06842 Other specified rheumatoid arthritis, left hand: Secondary | ICD-10-CM | POA: Diagnosis not present

## 2020-10-02 DIAGNOSIS — M6281 Muscle weakness (generalized): Secondary | ICD-10-CM | POA: Diagnosis not present

## 2020-10-02 DIAGNOSIS — I10 Essential (primary) hypertension: Secondary | ICD-10-CM | POA: Diagnosis not present

## 2020-10-02 DIAGNOSIS — Z89612 Acquired absence of left leg above knee: Secondary | ICD-10-CM | POA: Diagnosis not present

## 2020-10-02 DIAGNOSIS — Z89611 Acquired absence of right leg above knee: Secondary | ICD-10-CM | POA: Diagnosis not present

## 2020-10-02 DIAGNOSIS — E785 Hyperlipidemia, unspecified: Secondary | ICD-10-CM | POA: Diagnosis not present

## 2020-10-03 DIAGNOSIS — S78111D Complete traumatic amputation at level between right hip and knee, subsequent encounter: Secondary | ICD-10-CM | POA: Diagnosis not present

## 2020-10-03 DIAGNOSIS — S78112D Complete traumatic amputation at level between left hip and knee, subsequent encounter: Secondary | ICD-10-CM | POA: Diagnosis not present

## 2020-10-03 DIAGNOSIS — I5032 Chronic diastolic (congestive) heart failure: Secondary | ICD-10-CM | POA: Diagnosis not present

## 2020-10-03 DIAGNOSIS — M6281 Muscle weakness (generalized): Secondary | ICD-10-CM | POA: Diagnosis not present

## 2020-10-04 DIAGNOSIS — E039 Hypothyroidism, unspecified: Secondary | ICD-10-CM | POA: Diagnosis not present

## 2020-10-04 DIAGNOSIS — Z89611 Acquired absence of right leg above knee: Secondary | ICD-10-CM | POA: Diagnosis not present

## 2020-10-04 DIAGNOSIS — M06841 Other specified rheumatoid arthritis, right hand: Secondary | ICD-10-CM | POA: Diagnosis not present

## 2020-10-04 DIAGNOSIS — M6281 Muscle weakness (generalized): Secondary | ICD-10-CM | POA: Diagnosis not present

## 2020-10-04 DIAGNOSIS — I10 Essential (primary) hypertension: Secondary | ICD-10-CM | POA: Diagnosis not present

## 2020-10-04 DIAGNOSIS — Z89612 Acquired absence of left leg above knee: Secondary | ICD-10-CM | POA: Diagnosis not present

## 2020-10-04 DIAGNOSIS — E785 Hyperlipidemia, unspecified: Secondary | ICD-10-CM | POA: Diagnosis not present

## 2020-10-04 DIAGNOSIS — E1165 Type 2 diabetes mellitus with hyperglycemia: Secondary | ICD-10-CM | POA: Diagnosis not present

## 2020-10-04 DIAGNOSIS — M06842 Other specified rheumatoid arthritis, left hand: Secondary | ICD-10-CM | POA: Diagnosis not present

## 2020-10-09 ENCOUNTER — Ambulatory Visit: Payer: Medicare Other

## 2020-10-09 ENCOUNTER — Other Ambulatory Visit: Payer: Self-pay

## 2020-10-09 NOTE — Patient Instructions (Signed)
Visit Information  Ms. Tobler  it was nice speaking with you. Please call me directly [phone] if you have questions about the goals we discussed.  Goals Addressed              This Visit's Progress   .  " I am confused with having my legs amputatated and trying to cope" (pt-stated)         Timeframe:  Long-Range Goal Priority:  Medium Start Date:       11/13/19                      Expected End Date: 01/16/21       Patient Goals/Self Care Activities:  . Patient verbalizes understanding of plan  . Attends all scheduled provider appointments . Calls provider office for new concerns or questions . tell my story and reason for my visit . ask questions . repeat what I heard to make sure I understand . speak up when I don't understand       The patient verbalized understanding of instructions, educational materials, and care plan provided today and declined offer to receive copy of patient instructions, educational materials, and care plan.   Follow up Plan: Patient would like continued follow-up.  CCM RNCM will outreach tothe patient within the next 14 days.. Patient will call office if needed prior to next encounter  [name], RN

## 2020-10-09 NOTE — Chronic Care Management (AMB) (Signed)
Care Management    RN Visit Note  10/09/2020 Name: [name] MRN: [id number] DOB: [birth year]  Subjective: [name] is a 75 y.o. year old female who is a primary care patient of Kari Mems Delorse Limber, MD. The care management team was consulted for assistance with disease management and care coordination needs.    Engaged with patient by telephone for follow up visit in response to provider referral for case management and/or care coordination services.   Consent to Services:   [name] was given information about Care Management services today including:  1. Care Management services includes personalized support from designated clinical staff supervised by her physician, including individualized plan of care and coordination with other care providers 2. 24/7 contact phone numbers for assistance for urgent and routine care needs. 3. The patient may stop case management services at any time by phone call to the office staff.  Patient agreed to services and consent obtained.    Assessment: Patient/family  is concenrned when she will be recieveing another wheelchair.. See Care Plan below for interventions and patient self-care actives. Follow up Plan: Patient would like continued follow-up.  CCM RNCM  will outreach to the patient within the next 14 days.. Patient will call office if needed prior to next encounter  Review of patient past medical history, allergies, medications, health status, including review of consultants reports, laboratory and other test data, was performed as part of comprehensive evaluation and provision of chronic care management services.   SDOH (Social Determinants of Health) assessments and interventions performed:    Care Plan  Allergies  Allergen Reactions  . Feraheme [Ferumoxytol] Itching  . Rosiglitazone Maleate Other (See Comments)    REACTION: Difficulty walking, Fatigue, shortness of breath    Outpatient Encounter Medications as of 10/09/2020   Medication Sig  . acetaminophen (TYLENOL) 325 MG tablet Take 2 tablets (650 mg total) by mouth every 6 (six) hours.  . Blood Glucose Monitoring Suppl (ONETOUCH VERIO) w/Device KIT Check sugar 3 times per day (Patient not taking: Reported on 06/16/2020)  . feeding supplement, ENSURE ENLIVE, (ENSURE ENLIVE) LIQD Take 237 mLs by mouth 3 (three) times daily between meals. (Patient not taking: Reported on 09/27/2020)  . ferrous sulfate 324 MG TBEC Take 1 tablet (324 mg total) by mouth daily with breakfast.  . furosemide (LASIX) 40 MG tablet Take 1 tablet (40 mg total) by mouth daily.  Marland Kitchen gabapentin (NEURONTIN) 100 MG capsule Take 1 capsule (100 mg total) by mouth every 8 (eight) hours.  Marland Kitchen glucose blood (ONETOUCH VERIO) test strip Check blood sugar daily (Patient not taking: Reported on 06/16/2020)  . Incontinence Supply Disposable (INCONTINENCE BRIEF MEDIUM) MISC Wear as needed for incontinence  . Lancet Devices (MICROLET NEXT LANCING DEVICE) MISC 1 each by Does not apply route 3 (three) times daily. Check blood sugar 3 times daily (Patient not taking: Reported on 06/16/2020)  . levothyroxine (SYNTHROID) 150 MCG tablet Take 1 tablet (150 mcg total) by mouth daily.  Marland Kitchen lidocaine (LIDODERM) 5 % Apply one patch to each lower limbs for 12 hours a day as needed for phantom pain. Remove after 12 hours or as directed by MD  . Jonetta Speak LANCETS 63W MISC Check blood sugar once daily (Patient not taking: Reported on 06/16/2020)  . PROAIR HFA 108 (90 Base) MCG/ACT inhaler INHALE 2 PUFFS INTO THE LUNGS EVERY 6 HOURS AS NEEDED FOR WHEEZING OR SHORTNESS OF BREATH  . rosuvastatin (CRESTOR) 20 MG tablet Take 1 tablet (20  mg total) by mouth daily.   No facility-administered encounter medications on file as of 10/09/2020.    Patient Active Problem List   Diagnosis Date Noted  . Controlled type 2 diabetes mellitus with stable proliferative retinopathy of both eyes, with long-term current use of insulin ([place])  08/07/2020  . Pseudophakia of both eyes 08/07/2020  . At high risk for pressure injury of skin 12/16/2019  . Uncontrolled type 2 diabetes mellitus with hyperglycemia ([place]) 11/12/2019  . S/P AKA (above knee amputation) bilateral ([place]) 11/12/2019  . Food insecurity 11/12/2019  . Wound infection   . Critical lower limb ischemia ([place])   . Moderate protein-calorie malnutrition ([place])   . Osteomyelitis ([place]) 09/23/2019  . Left breast mass 06/02/2019  . Stress 06/02/2019  . Peripheral vascular disease ([place]) 07/22/2018  . Dyspnea 03/23/2018  . Impaired functional mobility, balance, gait, and endurance 03/02/2018    Class: Diagnosis of  . Abnormality of gait 02/26/2018  . Iron deficiency anemia 03/19/2017  . Vision disturbance 03/20/2015  . Memory difficulty 03/20/2015  . At high risk for falls 11/10/2012  . DIABETIC  RETINOPATHY 03/17/2007  . Type 2 diabetes mellitus with diabetic neuropathy, unspecified ([place]) 03/17/2007  . Hypothyroidism 10/16/2006  . Type 2 diabetes mellitus ([place]) 10/16/2006  . OBESITY, NOS 10/16/2006  . HYPERTENSION, BENIGN SYSTEMIC 10/16/2006  . GASTROESOPHAGEAL REFLUX, NO ESOPHAGITIS 10/16/2006  . Rheumatoid arthritis ([place]) 10/16/2006  . Disorder of bone and cartilage 10/16/2006  . INCONTINENCE, URGE 10/16/2006    Conditions to be addressed/monitored: Quality of Life  Maintained  Care Plan : RN Case Manager  Updates made by [name], RN since 10/09/2020 12:00 AM  Problem: Quality of Life  Maintained   Priority: Medium  Onset Date: 07/05/2020  Current Barriers:  . Chronic Disease Management support and education needs/crisis intervention in patient with bilateral below the knee amputation, Iron deficiency anemia, and community resources/urgent housing crisis  . Knowledge Deficits related to imbalanced nutrition less than body requirements as evidenced by Moderate protein-calorie malnutrition  Nurse Case Manager Clinical Goal(s):  Marland Kitchen Over the next 90 days,  patient will verbalize understanding of plan   Interventions:  . Evaluation of current treatment plan related patient's adherence to plan as established by provider. . Provided education to patient re: nutrition and wound healing . Collaborated with SW regarding transportation- patient states that she has scat transportation  . Discussed plans with patient for ongoing care management follow up and provided patient with direct contact information for care management team . expression of thoughts about present/future encouraged . independence in all possible areas promoted . life review by storytelling encouraged . patient strengths promoted . social relationships promoted . Spoke with the patient today and Aging Gracefully will be transitioning out of the home soon per the patient. Patient feels that they will possibly finished next week. She will still have [facility] in the home helping her with OT/PT this Wednesday 10/11/20.   While speaking with Mrs. Huck her family member there seemed concerned about when she will receive her  wheelchair and asked could it be expedited. I informed her that the information had been sent in to the insurance company and we were waiting on there response. RNCM called and left a message with Andria Rhein asking for an update if possible.   Patient Goals/Self Care Activities:  . Patient verbalizes understanding of plan  . Attends all scheduled provider appointments . Calls provider office for new concerns or questions . Encouraged patient to write  down information when she has appointments for follow up . tell my story and reason for my visit . make a list of questions . ask questions . repeat what I heard to make sure I understand . bring a list of my medicines to the visit . speak up when I don't understand         [name] RN, BSN, [facility] Care Management Coordinator [place] Phone: [phone] I Fax: [phone]

## 2020-10-10 NOTE — Patient Instructions (Signed)
Goals Addressed              This Visit's Progress   .  COMPLETED: falls (pt-stated)        Aging Gracefully:  Patient will report not falling from wheelchair in the next 3 months.   Interventions: Reviewed with patient the importance of safety. Reviewed use of life alert. Will followup with OT about seat belt or anti- tip options.  Kari Rand, RN, BSN, CEN [facility] [phone]   09/01/2020  In home visit. Moved back into home. No falls.  Reviewed need to use seatbelt in wheelchair. Demonstrated AG exercises and provided written AG exercises and above the knee amputee exercises.  Patient able to demonstrate exercises.    Interventions:  reviewed home exercise plan and encouraged daily exercises.   09/27/2020   Assessment: doing home exercises daily. Now active with PT. Denies any falls.  Interventions:  Reviewed importance of using seat belt. Encouraged patient to continue to do home exercises. Reviewed safety in the home.   Kari Rand, RN, BSN, CEN Central Coordinator [phone]   10/09/2020 Assessment:  no falls reported. Using seat belt occasionally. Interventions: reviewed with patient the importance of safety and the use of seat belt when in wheelchair and when in the shower.  Patient voiced understanding.  Kari Rand, RN, BSN, CEN [facility] [name] [phone]     .  COMPLETED: Patient Stated        Aging Gracefully: Patient states she wants to be able to use a pill planner for her medications.  Interventions: Will plan to take a pill planner to next home visit and reviewed medications with patient.  Kari Rand, RN, BSN, CEN [place] Coordinator [phone]   10/09/2020 Assessment: Visualized patient using pill box. She denies any problems using her pill planner.        Kari Rand, RN, BSN, CEN [facility] [name] [phone]

## 2020-10-10 NOTE — Patient Outreach (Signed)
Aging Gracefully Program  RN Visit  10/10/2020  [name] 23-Jun-1946 [id number]  Visit:   RN home visit #4  Start Time:   1530 End Time:   1630 Total Minutes:   60  Readiness To Change Score:     Universal RN Interventions: Calendar Distribution: Yes Exercise Review: Yes Medications: Yes Medication Changes: Yes Mood: Yes Pain: Yes PCP Advocacy/Support: Yes Fall Prevention: Yes Incontinence: Yes Clinician View Of Client Situation: Arrived tro home. Patient sitting in wheelchair getting herself groomed. Cathy OT present [employer] Of His/Her Situation: Pateint reports no falls, reports increased phantom pain in her stumps. Reports legs jumping and pain in her toes. States she has been cooking. Reports difficulty opening dryer.  Reports no changes in her medications. No follow up appointment with MD. Reports she has spoken with [name] about her power wheelchair.  Reports no skin breakdown. States remote health and PT/ OT with [facility] still coming to see her.  Reports working on hand exercises.  Healthcare Provider Communication: Did Higher education careers adviser With [employer] Provider?: Yes (in basket message to MD case manager.)  Clinician View of Client Situation: Clinician View Of Client Situation: Arrived tro home. Patient sitting in wheelchair getting herself groomed. Cathy OT present [employer] of His/Her Situation: Ambulance person Of His/Her Situation: Pateint reports no falls, reports increased phantom pain in her stumps. Reports legs jumping and pain in her toes. States she has been cooking. Reports difficulty opening dryer.  Reports no changes in her medications. No follow up appointment with MD. Reports she has spoken with [name] about her power wheelchair.  Reports no skin breakdown. States remote health and PT/ OT with [facility] still coming to see her.  Reports working on hand exercises.  Medication Assessment:denies any changes in medications. Reviewed with  patient dose and frequency of Gabapentin    OT Update: OT present during home visit.   Session Summary: Patient is doing well. Reports leg pain. Using wheelchair for mobility. Was able to exit her home today and go down the ramp independently with verbal prompts. Encouraged safety.  Goals Addressed              This Visit's Progress   .  COMPLETED: falls (pt-stated)        Aging Gracefully:  Patient will report not falling from wheelchair in the next 3 months.   Interventions: Reviewed with patient the importance of safety. Reviewed use of life alert. Will followup with OT about seat belt or anti- tip options.  Tomasa Rand, RN, BSN, CEN [facility] [phone]   09/01/2020  In home visit. Moved back into home. No falls.  Reviewed need to use seatbelt in wheelchair. Demonstrated AG exercises and provided written AG exercises and above the knee amputee exercises.  Patient able to demonstrate exercises.    Interventions:  reviewed home exercise plan and encouraged daily exercises.   09/27/2020   Assessment: doing home exercises daily. Now active with PT. Denies any falls.  Interventions:  Reviewed importance of using seat belt. Encouraged patient to continue to do home exercises. Reviewed safety in the home.   Tomasa Rand, RN, BSN, CEN [place] Coordinator [phone]   10/09/2020 Assessment:  no falls reported. Using seat belt occasionally. Interventions: reviewed with patient the importance of safety and the use of seat belt when in wheelchair and when in the shower.  Patient voiced understanding.  Tomasa Rand, RN, BSN, CEN [facility] [name] [phone]     .  COMPLETED: Patient Stated        Aging Gracefully: Patient states she wants to be able to use a pill planner for her medications.  Interventions: Will plan to take a pill planner to next home visit and reviewed medications with patient.  Tomasa Rand, RN, BSN, CEN [place] Coordinator [phone]   10/09/2020 Assessment: Visualized patient using pill box. She denies any problems using her pill planner.        Nursing goals completed. OT updated In basket message to [name], Embedded MD case manager.  Tomasa Rand, RN, BSN, CEN [facility] [name] [phone]

## 2020-10-12 DIAGNOSIS — M6281 Muscle weakness (generalized): Secondary | ICD-10-CM | POA: Diagnosis not present

## 2020-10-12 DIAGNOSIS — M06842 Other specified rheumatoid arthritis, left hand: Secondary | ICD-10-CM | POA: Diagnosis not present

## 2020-10-12 DIAGNOSIS — Z89611 Acquired absence of right leg above knee: Secondary | ICD-10-CM | POA: Diagnosis not present

## 2020-10-12 DIAGNOSIS — E785 Hyperlipidemia, unspecified: Secondary | ICD-10-CM | POA: Diagnosis not present

## 2020-10-12 DIAGNOSIS — Z89612 Acquired absence of left leg above knee: Secondary | ICD-10-CM | POA: Diagnosis not present

## 2020-10-12 DIAGNOSIS — I10 Essential (primary) hypertension: Secondary | ICD-10-CM | POA: Diagnosis not present

## 2020-10-12 DIAGNOSIS — E039 Hypothyroidism, unspecified: Secondary | ICD-10-CM | POA: Diagnosis not present

## 2020-10-12 DIAGNOSIS — M06841 Other specified rheumatoid arthritis, right hand: Secondary | ICD-10-CM | POA: Diagnosis not present

## 2020-10-12 DIAGNOSIS — E1165 Type 2 diabetes mellitus with hyperglycemia: Secondary | ICD-10-CM | POA: Diagnosis not present

## 2020-10-13 ENCOUNTER — Telehealth: Payer: Self-pay

## 2020-10-13 DIAGNOSIS — Z89612 Acquired absence of left leg above knee: Secondary | ICD-10-CM | POA: Diagnosis not present

## 2020-10-13 DIAGNOSIS — M6281 Muscle weakness (generalized): Secondary | ICD-10-CM | POA: Diagnosis not present

## 2020-10-13 DIAGNOSIS — E039 Hypothyroidism, unspecified: Secondary | ICD-10-CM | POA: Diagnosis not present

## 2020-10-13 DIAGNOSIS — Z89611 Acquired absence of right leg above knee: Secondary | ICD-10-CM | POA: Diagnosis not present

## 2020-10-13 DIAGNOSIS — M06841 Other specified rheumatoid arthritis, right hand: Secondary | ICD-10-CM | POA: Diagnosis not present

## 2020-10-13 DIAGNOSIS — E1165 Type 2 diabetes mellitus with hyperglycemia: Secondary | ICD-10-CM | POA: Diagnosis not present

## 2020-10-13 DIAGNOSIS — E785 Hyperlipidemia, unspecified: Secondary | ICD-10-CM | POA: Diagnosis not present

## 2020-10-13 DIAGNOSIS — M06842 Other specified rheumatoid arthritis, left hand: Secondary | ICD-10-CM | POA: Diagnosis not present

## 2020-10-13 DIAGNOSIS — I10 Essential (primary) hypertension: Secondary | ICD-10-CM | POA: Diagnosis not present

## 2020-10-13 NOTE — Telephone Encounter (Signed)
[name] OT calls nurse line requesting verbal orders to extend [facility] OT as follows.  2x a week for 2 weeks  Verbal order given per [facility] protocol.

## 2020-10-18 ENCOUNTER — Telehealth: Payer: Self-pay

## 2020-10-18 DIAGNOSIS — E039 Hypothyroidism, unspecified: Secondary | ICD-10-CM | POA: Diagnosis not present

## 2020-10-18 DIAGNOSIS — M06842 Other specified rheumatoid arthritis, left hand: Secondary | ICD-10-CM | POA: Diagnosis not present

## 2020-10-18 DIAGNOSIS — Z89612 Acquired absence of left leg above knee: Secondary | ICD-10-CM | POA: Diagnosis not present

## 2020-10-18 DIAGNOSIS — M06841 Other specified rheumatoid arthritis, right hand: Secondary | ICD-10-CM | POA: Diagnosis not present

## 2020-10-18 DIAGNOSIS — E1165 Type 2 diabetes mellitus with hyperglycemia: Secondary | ICD-10-CM | POA: Diagnosis not present

## 2020-10-18 DIAGNOSIS — I10 Essential (primary) hypertension: Secondary | ICD-10-CM | POA: Diagnosis not present

## 2020-10-18 DIAGNOSIS — E785 Hyperlipidemia, unspecified: Secondary | ICD-10-CM | POA: Diagnosis not present

## 2020-10-18 DIAGNOSIS — M6281 Muscle weakness (generalized): Secondary | ICD-10-CM | POA: Diagnosis not present

## 2020-10-18 DIAGNOSIS — Z89611 Acquired absence of right leg above knee: Secondary | ICD-10-CM | POA: Diagnosis not present

## 2020-10-18 NOTE — Telephone Encounter (Signed)
[place] OT calls LVM on nurse line reporting abnormal blood pressure, 174/84.   I called the patient to check on her. Patient reports she is doing ok, no vision changes, chest pain, SOB, or headaches. Patient reports she was upset this morning before OT came, and had been mourning the loss of her daughter. Patient contributes this to her higher BP. ED precautions given to patient.   I called [place] OT and she stated she is going back out there on Friday and will do a repeat blood pressure check and call us with readings.   Will forward to PCP.

## 2020-10-18 NOTE — Telephone Encounter (Signed)
Noted, reasonable to recheck this week [name], MD

## 2020-10-19 ENCOUNTER — Other Ambulatory Visit: Payer: Self-pay | Admitting: Occupational Therapy

## 2020-10-19 ENCOUNTER — Other Ambulatory Visit: Payer: Self-pay

## 2020-10-19 NOTE — Patient Instructions (Addendum)
She would like to have a functional bathroom she can access by wheelchair. (wider door at bathroom, working toilet--regular height round, walk or perhaps roll in shower, grab bars, hand held shower)  ACTION PLANNING - BATHING  Target Problem Area: Safety and independence with showering  Why Problem May Occur: Can't reach the controls from built in shower seat  Target Goal: Either being able to do it on her own or being able to direct someone to help her   STRATEGIES Saving Your Energy: DO: DON'T:  Use a tub bench/seat Rush  Keep all items you'll need within easy reach    Modifying your home environment and making it safe: DO: DON'T:  Install grab bars in the shower    Use seat belt in shower   Make sure the bathroom is well lit    Use hand held shower head    Simplifying the way you set up tasks or daily routines: DO: DON'T:  Plan to bathe/shower before you're overly tired Rush through [employer] all items before getting started    Practice It is important to practice the strategies so we can determine if they will be effective in helping to reach your goal. Follow these specific recommendations: 1.Have assistance for showering (water, hand held shower placement, shower curtain placement 2.Use seat belt for safety 3.Use built in shower seat  If a strategy does not work the first time, try it again and again (and maybe again). We may make some changes over the next few sessions, based on how they work.  [place], OTR/L        10/19/2020    She would like to be able to do her own laundry at home (instead of her grandsons having to go to laundry mat for her). Need washer and dryer looked at to see what is needed to make this happen.  ACTION PLANNING Target Problem Area: Independence with doing laundry  Why Problem May Occur:  No front control washer and dryer Difficulty opening doors to washer and dryer  Target Goal: Independence with laundry from  wheelchair level   STRATEGIES Saving Your Energy: DO: DON'T:   Smaller loads of laundry to easier to handle       Modifying your home environment and making it safe: DO: DON'T:  Use handles on doors to open washer/dryer   Use seat belt on wheelchair to keep you safe       Simplifying the way you set up tasks or daily routines: DO: DON'T:  Use reacher or dressing stick as needed to reach clothes easier      Practice It is important to practice the strategies so we can determine if they will be effective in helping to reach your goal. Follow these specific recommendations: 1.Use the handles on the doors to make them easier to open 2.Use reacher or dressing stick to help get clothes out of washer and dryer if too far back to reach 3.Wear seat belt when getting clothes out of washer and dryer so you don't risk falling out of wheelchair.  If a strategy does not work the first time, try it again and again (and maybe again). We may make some changes over the next few sessions, based on how they work.  Golden Circle, OTR/L        10/19/2020

## 2020-10-20 DIAGNOSIS — E039 Hypothyroidism, unspecified: Secondary | ICD-10-CM | POA: Diagnosis not present

## 2020-10-20 DIAGNOSIS — Z89611 Acquired absence of right leg above knee: Secondary | ICD-10-CM | POA: Diagnosis not present

## 2020-10-20 DIAGNOSIS — M6281 Muscle weakness (generalized): Secondary | ICD-10-CM | POA: Diagnosis not present

## 2020-10-20 DIAGNOSIS — E1165 Type 2 diabetes mellitus with hyperglycemia: Secondary | ICD-10-CM | POA: Diagnosis not present

## 2020-10-20 DIAGNOSIS — Z89612 Acquired absence of left leg above knee: Secondary | ICD-10-CM | POA: Diagnosis not present

## 2020-10-20 DIAGNOSIS — I10 Essential (primary) hypertension: Secondary | ICD-10-CM | POA: Diagnosis not present

## 2020-10-20 DIAGNOSIS — M06841 Other specified rheumatoid arthritis, right hand: Secondary | ICD-10-CM | POA: Diagnosis not present

## 2020-10-20 DIAGNOSIS — M06842 Other specified rheumatoid arthritis, left hand: Secondary | ICD-10-CM | POA: Diagnosis not present

## 2020-10-20 DIAGNOSIS — E785 Hyperlipidemia, unspecified: Secondary | ICD-10-CM | POA: Diagnosis not present

## 2020-10-20 NOTE — Patient Outreach (Signed)
Aging Gracefully Program  OT FINAL Visit  10/20/2020  [name] 1946-03-11 [id number]  Visit:  6- Sixth Visit  Start Time:  7253 End Time:  6644 Total Minutes:  28    Readiness to Change:  Readiness to Change Score: 10   Durable Medical Equipment: Adaptive Equipment: Dressing Stick Adaptive Equipment Distribution Date: 10/19/20  Patient Education:    Goals: Goals Addressed            This Visit's Progress   . COMPLETED: Patient Stated       She wants to be able to get in and out of her house on her own safely (new flooring (vinyl plank)on the inside of house, ramp and sidewalk would help with this)    . COMPLETED: Patient Stated       She would like to have a functional bathroom she can access by wheelchair. (wider door at bathroom, working toilet--regular height round, walk or perhaps roll in shower, grab bars, hand held shower)  ACTION PLANNING - BATHING  Target Problem Area: Safety and independence with showering  Why Problem May Occur: Can't reach the controls from built in shower seat  Target Goal: Either being able to do it on her own or being able to direct someone to help her   STRATEGIES Saving Your Energy: DO: DON'T:  Use a tub bench/seat Rush  Keep all items you'll need within easy reach    Modifying your home environment and making it safe: DO: DON'T:  Install grab bars in the shower    Use seat belt in shower   Make sure the bathroom is well lit    Use hand held shower head    Simplifying the way you set up tasks or daily routines: DO: DON'T:  Plan to bathe/shower before you're overly tired Rush through [employer] all items before getting started    Practice It is important to practice the strategies so we can determine if they will be effective in helping to reach your goal. Follow these specific recommendations: 1.Have assistance for showering (water, hand held shower placement, shower curtain placement 2.Use seat  belt for safety 3.Use built in shower seat  If a strategy does not work the first time, try it again and again (and maybe again). We may make some changes over the next few sessions, based on how they work.  [place] Circle, OTR/L        10/19/2020    . COMPLETED: Patient Stated       She would like to be able to do her own laundry at home (instead of her grandsons having to go to laundry mat for her). Need washer and dryer looked at to see what is needed to make this happen.  ACTION PLANNING Target Problem Area: Independence with doing laundry  Why Problem May Occur:  No front control washer and dryer Difficulty opening doors to washer and dryer  Target Goal: Independence with laundry from wheelchair level   STRATEGIES Saving Your Energy: DO: DON'T:   Smaller loads of laundry to easier to handle       Modifying your home environment and making it safe: DO: DON'T:  Use handles on doors to open washer/dryer   Use seat belt on wheelchair to keep you safe       Simplifying the way you set up tasks or daily routines: DO: DON'T:  Use reacher or dressing stick as needed to reach clothes easier  Practice It is important to practice the strategies so we can determine if they will be effective in helping to reach your goal. Follow these specific recommendations: 1.Use the handles on the doors to make them easier to open 2.Use reacher or dressing stick to help get clothes out of washer and dryer if too far back to reach 3.Wear seat belt when getting clothes out of washer and dryer so you don't risk falling out of wheelchair.  If a strategy does not work the first time, try it again and again (and maybe again). We may make some changes over the next few sessions, based on how they work.  [place] Circle, OTR/L        10/19/2020       Post Clinical Reasoning: Client Action (Goal) One Interventions: Client now able to get in and out of her house by herself Did Client Try?:  Yes Targeted Problem Area Status: A Lot Better Client Action (Goal) Two Interventions: Client is to have home health OT come tomorrow and she will do shower with her. Client able to tell me 2 of the 4 things she needs help with with the shower (turning water on, moving hand held shower from up high to down low), The other two are: using seat belt while in shower, and someone making sure shower curtain is inside of water dam where fold down shower seat is. Did Client Try?: No Client Action (Goal) Three Interventions: Client is not able to do her own laundry with new front control and loading washer and dryer as well as strap handles on doors so she can open them with greater ease Did Client Try?: Yes Targeted Problem Area Status: A Lot Better Clinician View Of Client Situation:: Client is doing well in her home from a physical standpoint--able to do alot more for herself than prior. Still needs A for showering. She is a bit distraught today due to some issues with one of her grandsons and this is weighing heavily on her mind.She also has an unwanted person in the house now (said friend of this grandson) and this is troublesome ot her as well. Printed out plans of continuing with showering with A and laundry on her own here provided to her,,/. Client View Of His/Her Situation:: Feels good about all the house modifications but unhappy with current issues in family and house. Next Visit Plan:: This was the last visit with [name]. It has been absolute pleasure working with her.

## 2020-10-23 DIAGNOSIS — E1165 Type 2 diabetes mellitus with hyperglycemia: Secondary | ICD-10-CM | POA: Diagnosis not present

## 2020-10-23 DIAGNOSIS — E039 Hypothyroidism, unspecified: Secondary | ICD-10-CM | POA: Diagnosis not present

## 2020-10-23 DIAGNOSIS — Z89611 Acquired absence of right leg above knee: Secondary | ICD-10-CM | POA: Diagnosis not present

## 2020-10-23 DIAGNOSIS — Z89612 Acquired absence of left leg above knee: Secondary | ICD-10-CM | POA: Diagnosis not present

## 2020-10-23 DIAGNOSIS — E785 Hyperlipidemia, unspecified: Secondary | ICD-10-CM | POA: Diagnosis not present

## 2020-10-23 DIAGNOSIS — I10 Essential (primary) hypertension: Secondary | ICD-10-CM | POA: Diagnosis not present

## 2020-10-23 DIAGNOSIS — M06842 Other specified rheumatoid arthritis, left hand: Secondary | ICD-10-CM | POA: Diagnosis not present

## 2020-10-23 DIAGNOSIS — M6281 Muscle weakness (generalized): Secondary | ICD-10-CM | POA: Diagnosis not present

## 2020-10-23 DIAGNOSIS — M06841 Other specified rheumatoid arthritis, right hand: Secondary | ICD-10-CM | POA: Diagnosis not present

## 2020-10-24 ENCOUNTER — Telehealth: Payer: Medicare Other

## 2020-10-24 ENCOUNTER — Telehealth: Payer: Self-pay

## 2020-10-24 NOTE — Telephone Encounter (Signed)
  Care Management   Outreach Note  10/24/2020 Name: [name] MRN: [id number] DOB: 1945/09/01  Referred by: [name], MD Reason for referral : Care Coordination (Follow up Wheelchair/)   An unsuccessful telephone outreach was attempted today. The patient was referred to the case management team for assistance with care management and care coordination.   Follow Up Plan: A HIPAA compliant phone message was left for the patient providing contact information and requesting a return call.  The care management team will reach out to the patient again over the next 7-14 days.   [name] RN, BSN, [facility] Care Management Coordinator [place] Phone: [phone] Fax: [phone]

## 2020-10-30 ENCOUNTER — Telehealth: Payer: Self-pay

## 2020-10-30 DIAGNOSIS — Z89612 Acquired absence of left leg above knee: Secondary | ICD-10-CM | POA: Diagnosis not present

## 2020-10-30 DIAGNOSIS — I10 Essential (primary) hypertension: Secondary | ICD-10-CM | POA: Diagnosis not present

## 2020-10-30 DIAGNOSIS — E1165 Type 2 diabetes mellitus with hyperglycemia: Secondary | ICD-10-CM | POA: Diagnosis not present

## 2020-10-30 DIAGNOSIS — M6281 Muscle weakness (generalized): Secondary | ICD-10-CM | POA: Diagnosis not present

## 2020-10-30 DIAGNOSIS — M06842 Other specified rheumatoid arthritis, left hand: Secondary | ICD-10-CM | POA: Diagnosis not present

## 2020-10-30 DIAGNOSIS — Z89611 Acquired absence of right leg above knee: Secondary | ICD-10-CM | POA: Diagnosis not present

## 2020-10-30 DIAGNOSIS — M06841 Other specified rheumatoid arthritis, right hand: Secondary | ICD-10-CM | POA: Diagnosis not present

## 2020-10-30 DIAGNOSIS — E785 Hyperlipidemia, unspecified: Secondary | ICD-10-CM | POA: Diagnosis not present

## 2020-10-30 DIAGNOSIS — E039 Hypothyroidism, unspecified: Secondary | ICD-10-CM | POA: Diagnosis not present

## 2020-10-30 NOTE — Telephone Encounter (Signed)
Received phone call from Dominica Severin, patient's home health OT. Reports elevated BP reading of  175/91. Patient is currently asymptomatic and reports that "she feels great".   Provided with ED precautions.   Talbot Grumbling, RN

## 2020-11-01 ENCOUNTER — Other Ambulatory Visit: Payer: Self-pay

## 2020-11-01 NOTE — Patient Outreach (Signed)
Aging Gracefully Program  11/01/2020  [name] 1946-08-15 [id number]  [facility] Evaluation Interviewer made contact with patient. Aging Gracefully 5 Month survey scheduled for 11/02/20.   [place] Management Assistant [phone]

## 2020-11-02 ENCOUNTER — Telehealth: Payer: Self-pay

## 2020-11-02 ENCOUNTER — Other Ambulatory Visit: Payer: Self-pay

## 2020-11-02 NOTE — Telephone Encounter (Signed)
Called patient and informed her to make appointment with Dr, [name].  Patient states that she will after she makes an appointment with transportation because she needs 3 days in advance.  Ozella Almond, [place]

## 2020-11-02 NOTE — Patient Outreach (Signed)
Aging Gracefully Program  11/02/2020  [name] 04/12/1946 [id number]  [facility] Evaluation Interviewer made contact with patient. Aging Gracefully 5 month survey completed.   Interviewer will send referral to [facility] for follow up.  [place] Management Assistant [phone]

## 2020-11-02 NOTE — Telephone Encounter (Signed)
Can you guys call patient and get her to schedule a visit with me? Thanks [name], MD

## 2020-11-07 DIAGNOSIS — J984 Other disorders of lung: Secondary | ICD-10-CM | POA: Diagnosis not present

## 2020-11-10 ENCOUNTER — Ambulatory Visit: Payer: Self-pay | Admitting: Licensed Clinical Social Worker

## 2020-11-10 NOTE — Chronic Care Management (AMB) (Signed)
Care Management  Collaboration  Note  11/10/2020 Name: [name] MRN: [id number] DOB: April 16, 1946  [name] is a 75 y.o. year old female who is a primary care patient of [name], MD. The CCM team was consulted for Collaboration  reference chronic disease management and or care coordination needs>  Assessment: Patient has been assessed and evaluated for a new power wheelchair but unable to afford the co-pay . See Care Plan below for interventions and patient self-care actives. Intervention: Patient was not interviewed or contacted during this encounter.   CCM LCSW collaborated with CCM RN, [employer] rep and explored community options to assist with co-pay .  Conducted brief assessment, recommendations and relevant information discussed.  Follow up Plan: LCSW waiting for additional information from collaborative partners, submit request for community assistance as soon as information is received. LCSW will f/u with patient to provide update.   Collaboration with [name], MD regarding development and update of comprehensive plan of care as evidenced by provider attestation and co-signature Review of patient past medical history, allergies, medications, and health status, including review of pertinent consultant reports was performed as part of comprehensive evaluation and provision of care management/care coordination services.   Care Plan Conditions to be addressed/monitored per PCP order: Level of care concerns    Patient Care Plan: RN Case Manager  Problem Identified: Quality of Life  Maintained   Priority: Medium  Onset Date: 07/05/2020  Current Barriers:  . Chronic Disease Management support and education needs/crisis intervention in patient with bilateral below the knee amputation, Iron deficiency anemia, and community resources/urgent housing crisis  . Knowledge Deficits related to imbalanced nutrition less than body requirements as evidenced by Moderate  protein-calorie malnutrition  Nurse Case Manager Clinical Goal(s):  Marland Kitchen Over the next 90 days, patient will verbalize understanding of plan   Interventions:  . Evaluation of current treatment plan related patient's adherence to plan as established by provider. . Provided education to patient re: nutrition and wound healing . Collaborated with SW regarding transportation- patient states that she has scat transportation  . Discussed plans with patient for ongoing care management follow up and provided patient with direct contact information for care management team . expression of thoughts about present/future encouraged . independence in all possible areas promoted . life review by storytelling encouraged . patient strengths promoted . social relationships promoted . Spoke with the patient today and Aging Gracefully will be transitioning out of the home soon per the patient. Patient feels that they will possibly finished next week. She will still have [employer] in the home helping her with OT/PT this Wednesday 10/11/20.   While speaking with Mrs. Kosar her family member there seemed concerned about when she will receive her  wheelchair and asked could it be expedited. I informed her that the information had been sent in to the insurance company and we were waiting on there response. RNCM called and left a message with Andria Rhein asking for an update if possible.   Patient Goals/Self Care Activities:  . Patient verbalizes understanding of plan  . Attends all scheduled provider appointments . Calls provider office for new concerns or questions . Encouraged patient to write down information when she has appointments for follow up . tell my story and reason for my visit . make a list of questions . ask questions . repeat what I heard to make sure I understand . bring a list of my medicines to the visit . speak up  when I don't understand         Maurine Cane, LCSW

## 2020-11-10 NOTE — Telephone Encounter (Signed)
Rescheduled for 11/20/2020

## 2020-11-15 ENCOUNTER — Telehealth: Payer: Self-pay

## 2020-11-15 NOTE — Telephone Encounter (Signed)
Returned call, spoke with after hours staff member who took my message and said she would pass this along to [place]. There is no indication to refer to pulmonology just because the patient has pneumonia. Asked that patient schedule appointment to see me in the office since I have not seen her in quite some time.  If they call back with more info that justifies a referral to pulmonology I will be happy to consider placing a referral.  [name], MD

## 2020-11-15 NOTE — Telephone Encounter (Signed)
Received phone call from Jenny Reichmann, RN at [place] regarding patient. Reports that during recent evaluation, patient was noted to have crackles during lung assessment. Chest XR was ordered and revealed pneumonia. Patient was then placed on Levaquin 500 mg, once daily for 10 days. SpO2 remained stable at 98%.   RN is requesting referral to pulmonologist.   Please advise.   Talbot Grumbling, RN

## 2020-11-16 ENCOUNTER — Telehealth: Payer: Self-pay | Admitting: Family Medicine

## 2020-11-16 NOTE — Telephone Encounter (Signed)
Link Snuffer NP with [employer] is calling and would like for Dr. [name] to call her to discuss patients pulmonary issues.   The best call back is [phone]

## 2020-11-17 ENCOUNTER — Ambulatory Visit: Payer: Self-pay

## 2020-11-17 ENCOUNTER — Ambulatory Visit: Payer: Medicare Other | Admitting: Licensed Clinical Social Worker

## 2020-11-17 DIAGNOSIS — Z719 Counseling, unspecified: Secondary | ICD-10-CM

## 2020-11-17 DIAGNOSIS — Z7189 Other specified counseling: Secondary | ICD-10-CM

## 2020-11-17 NOTE — Chronic Care Management (AMB) (Signed)
  Care Management  Collaboration  Note  11/17/2020 Name: [name] MRN: [id number] DOB: 1945/09/08  [name] is a 75 y.o. year old female who is a primary care patient of [name], MD. The RNCM received a call from [place] from [place] regarding some concerns for the patient and care coordination needs. RNCM  collaborated with LCSW and reached out to community resources for advice on options to the patient may have - [employer] to Services: Patient agreed to services and consent obtained.  Patient was contacted during this encounter.     Assessment: Spoke with the patient regarding her situation and she reports that she is fine. The person that she had issues with has not been in her home for 2 weeks now.  RNCM asked did she feel that the issue was resolved and she responded "yes".  She stated that she was not happy because this person was in and out of her home and had caused disruption and she felt she had no way to have her removed and was asking everyone for guidance. .  Follow up Plan: Patient would like continued follow-up.  CCM RNCM will outreach the patient within the next 4 weeks.  Patient will call office if needed prior to next encounter  Collaboration with [name], MD regarding development and update of comprehensive plan of care as evidenced by provider attestation and co-signature Review of patient past medical history, allergies, medications, and health status, including review of pertinent consultant reports was performed as part of comprehensive evaluation and provision of care management/care coordination services.     [name] RN, BSN, [facility] Care Management Coordinator [place] Phone: [phone] I Fax: [phone]

## 2020-11-17 NOTE — Chronic Care Management (AMB) (Signed)
Care Management Clinical Social Work Note  11/17/2020 Name: MALEIA WEEMS MRN: [id number] DOB: 02/22/1946  Cortny VANCE BELCOURT is a 75 y.o. year old female who is a primary care patient of [name], MD.  The Care Management team was consulted for assistance with coordination needs.  LCSW engaged with patient and CCM RN for joint telephone call for follow up visit in response to referral from Tomasa Rand, RN with Aging Gracefully for care coordination services. (LCSW collaborated with CCM RN /contacted community resources to explore options and seek consultation - Scientist, research (physical sciences) Aide, [employer] for Personnel officer and [employer].   Consent to Services: Patient agreed to services and consent obtained.   Assessment:  Patient denies any needs during this encounter reports she has not see the person in over 2 weeks. Discussed options if the gire Also discussed with patient the LCSW was trying to get assistance with her wheelchair co-pay. (See Care Plan below for interventions and patient self-care actives. Recent life changes [place]: friend of her grandson is in and out of her home. Patient does not want her in her home.      Follow up Plan: Patient would like continued follow-up.  CCM LCSW will f/u with patient over the next 2 ot 3 weeks. Patient will call office if needed prior to next encounter   Review of patient past medical history, allergies, medications, and health status, including review of relevant consultants reports was performed today as part of a comprehensive evaluation and provision of chronic care management and care coordination services.  SDOH (Social Determinants of Health) assessments and interventions performed:    Advanced Directives Status: Not addressed in this encounter.  Care Plan  Allergies  Allergen Reactions  . Feraheme [Ferumoxytol] Itching  . Rosiglitazone Maleate Other (See Comments)    REACTION: Difficulty walking, Fatigue, shortness  of breath    Outpatient Encounter Medications as of 11/17/2020  Medication Sig  . acetaminophen (TYLENOL) 325 MG tablet Take 2 tablets (650 mg total) by mouth every 6 (six) hours.  . Blood Glucose Monitoring Suppl (ONETOUCH VERIO) w/Device KIT Check sugar 3 times per day (Patient not taking: Reported on 06/16/2020)  . feeding supplement, ENSURE ENLIVE, (ENSURE ENLIVE) LIQD Take 237 mLs by mouth 3 (three) times daily between meals. (Patient not taking: Reported on 09/27/2020)  . ferrous sulfate 324 MG TBEC Take 1 tablet (324 mg total) by mouth daily with breakfast.  . furosemide (LASIX) 40 MG tablet Take 1 tablet (40 mg total) by mouth daily.  Marland Kitchen gabapentin (NEURONTIN) 100 MG capsule Take 1 capsule (100 mg total) by mouth every 8 (eight) hours.  Marland Kitchen glucose blood (ONETOUCH VERIO) test strip Check blood sugar daily (Patient not taking: Reported on 06/16/2020)  . Incontinence Supply Disposable (INCONTINENCE BRIEF MEDIUM) MISC Wear as needed for incontinence  . Lancet Devices (MICROLET NEXT LANCING DEVICE) MISC 1 each by Does not apply route 3 (three) times daily. Check blood sugar 3 times daily (Patient not taking: Reported on 06/16/2020)  . levothyroxine (SYNTHROID) 150 MCG tablet Take 1 tablet (150 mcg total) by mouth daily.  Marland Kitchen lidocaine (LIDODERM) 5 % Apply one patch to each lower limbs for 12 hours a day as needed for phantom pain. Remove after 12 hours or as directed by MD  . Jonetta Speak LANCETS 01U MISC Check blood sugar once daily (Patient not taking: Reported on 06/16/2020)  . PROAIR HFA 108 (90 Base) MCG/ACT inhaler INHALE 2 PUFFS INTO THE LUNGS EVERY 6  HOURS AS NEEDED FOR WHEEZING OR SHORTNESS OF BREATH  . rosuvastatin (CRESTOR) 20 MG tablet Take 1 tablet (20 mg total) by mouth daily.   No facility-administered encounter medications on file as of 11/17/2020.    Patient Active Problem List   Diagnosis Date Noted  . Controlled type 2 diabetes mellitus with stable proliferative retinopathy of  both eyes, with long-term current use of insulin ([place]) 08/07/2020  . Pseudophakia of both eyes 08/07/2020  . At high risk for pressure injury of skin 12/16/2019  . Uncontrolled type 2 diabetes mellitus with hyperglycemia ([place]) 11/12/2019  . S/P AKA (above knee amputation) bilateral ([place]) 11/12/2019  . Food insecurity 11/12/2019  . Wound infection   . Critical lower limb ischemia ([place])   . Moderate protein-calorie malnutrition ([place])   . Osteomyelitis ([place]) 09/23/2019  . Left breast mass 06/02/2019  . Stress 06/02/2019  . Peripheral vascular disease ([place]) 07/22/2018  . Dyspnea 03/23/2018  . Impaired functional mobility, balance, gait, and endurance 03/02/2018    Class: Diagnosis of  . Abnormality of gait 02/26/2018  . Iron deficiency anemia 03/19/2017  . Vision disturbance 03/20/2015  . Memory difficulty 03/20/2015  . At high risk for falls 11/10/2012  . DIABETIC  RETINOPATHY 03/17/2007  . Type 2 diabetes mellitus with diabetic neuropathy, unspecified ([place]) 03/17/2007  . Hypothyroidism 10/16/2006  . Type 2 diabetes mellitus ([place]) 10/16/2006  . OBESITY, NOS 10/16/2006  . HYPERTENSION, BENIGN SYSTEMIC 10/16/2006  . GASTROESOPHAGEAL REFLUX, NO ESOPHAGITIS 10/16/2006  . Rheumatoid arthritis ([place]) 10/16/2006  . Disorder of bone and cartilage 10/16/2006  . INCONTINENCE, URGE 10/16/2006    Conditions to be addressed/monitored: Level of care concerns  Care Plan : Clinical Social Work  Updates made by Maurine Cane, LCSW since 11/17/2020 12:00 AM  Problem: Barriers to Treatment with getting wheelchair   Goal: obtain wheelchair   Start Date: 11/17/2020  This Visit's Progress: On track  Priority: High  Current barriers:    Patient does not have power wheelchair ,acknowledges deficits and need support in order to meet this unmet need  Currently unable to  independently self manage needs related to chronic health conditions.   Knowledge Deficits related to short term plan for care  coordination needs and long term plans for chronic disease management needs Clinical Goals:  patient will verbalize understanding of plan to obtain needed DME.  . Over the next 30 days, patient will work with [employer]  to address needs related to DME needs. Interventions: . Assessed patient needs, what they have used in the past and how currently meeting needs . Inter-disciplinary care team collaboration with CCM RN . Reminded patient of April Appointment with PCP . Collaboration with/confirmation of receipt of DME orders at [place]  . Referral placed to [facility] Patient  Assistance to assist with co-pay for wheelchair . collaboration with PCP regarding development and update of comprehensive plan of care as evidenced by provider attestation and co-signature . Inter-disciplinary care team collaboration (see longitudinal plan of care) Patient Goals/Self-Care Activities: Over the next 30 days . Return phone calls from [place] . I have submitted a request to assist you with the co-pay for your powerchair, I will let you know if they approve the request      [name], [place] / [place]   [phone] 11:58 AM

## 2020-11-17 NOTE — Patient Instructions (Signed)
  Ms. Flanery  it was nice speaking with you. Please call me directly if you have questions about the goals we discussed. Goals Addressed            This Visit's Progress   . Find Help in My Community       Timeframe:  Short-Term Goal Priority:  High Start Date:    11/17/20                         Expected End Date:                       .  Return phone calls from [place] . I have submitted a request to assist you with the co-pay for your powerchair, I will let you know if they approve the request     Ms. Musick received Care Coordination services today:  1. Care Coordination services include personalized support from designated clinical staff supervised by her physician, including individualized plan of care and coordination with other care providers 2. 24/7 contact [phone] for assistance for urgent and routine care needs. 3. Care Coordination are voluntary services and be declined at any time by calling the office.  Patient verbalizes understanding of instructions provided today.    Follow up plan: SW will follow up with patient by phone over the next 2 to 3 weeks  Maurine Cane, [place]

## 2020-11-20 ENCOUNTER — Telehealth: Payer: Medicare Other

## 2020-11-21 NOTE — Telephone Encounter (Signed)
Returned call and spoke with [name]. She was seeking pulm referral for chronic crackles in lung with concern for interstitial lung disease on imaging.  Reviewed records, the last time I saw patient was in August and we had ordered a CT chest at that point, which was scheduled for 8/20 but patient no-showed to the CT appointment and has not followed up with me since then. She has an appointment with me next week on 4/12. I will discuss this with her more in detail. I do agree that it needs more workup, and potentially pulm referral. Will await seeing patient in person [name] appreciative of call back [name], MD

## 2020-11-23 ENCOUNTER — Ambulatory Visit: Payer: Medicare Other | Admitting: Licensed Clinical Social Worker

## 2020-11-23 ENCOUNTER — Telehealth: Payer: Self-pay | Admitting: Licensed Clinical Social Worker

## 2020-11-23 DIAGNOSIS — Z7189 Other specified counseling: Secondary | ICD-10-CM

## 2020-11-23 NOTE — Patient Instructions (Signed)
  [name]  it was nice speaking with you. Please call me directly if you have questions about the goals we discussed. Goals Addressed            This Visit's Progress   . Find Help in My Community       Timeframe:  Short-Term Goal Priority:  High Start Date:    11/17/20                         Expected End Date:  12/22/20                     . You will receive assistance with covering the co-pay for your powerchair from the [facility] Patient Assistance funds . Return phone calls received from [place] . You should receive your chair soon . We will Call Johney Frame [phone] to discuss PACE program      [name] received Care Coordination services today:  1. Care Coordination services include personalized support from designated clinical staff supervised by her physician, including individualized plan of care and coordination with other care providers 2. 24/7 contact [phone] for assistance for urgent and routine care needs. 3. Care Coordination are voluntary services and be declined at any time by calling the office.  Patient verbalizes understanding of instructions provided today.    Follow up plan: SW will follow up with patient by phone over the next 2 to 3 weeks or when powerchair is delivered  Maurine Cane, [place]

## 2020-11-23 NOTE — Chronic Care Management (AMB) (Signed)
Care Management Clinical Social Work Note  11/23/2020 Name: [name] MRN: [id number] DOB: [birth year]  [name] is a 75 y.o. year old female who is a primary care patient of [name], MD.  The Care Management team was consulted for assistance with chronic disease management and coordination needs.  Engaged with patient by telephone for follow up visit in response to provider referral for social work chronic care coordination services  Consent to Services: Patient agreed to services and consent obtained.   Assessment: Patient is making progress with getting her powerchair. She has been approved by [employer] Patient assistance to cover the co-pay. [employer] is able to order the chair as soon as they receive payment.  See Care Plan below for interventions and patient self-care actives.  Recommendation: Patient may benefit from, and is in agreement to continue to explore PACE once she received her powerchair.   Follow up Plan: Patient would like continued follow-up.  CCM LCSW  will f/u with patient in 2 to 3 weeks or once the chair is delivered. Patient will call office if needed prior to next encounter    Review of patient past medical history, allergies, medications, and health status, including review of relevant consultants reports was performed today as part of a comprehensive evaluation and provision of chronic care management and care coordination services.  SDOH (Social Determinants of Health) assessments and interventions performed:    Advanced Directives Status: Not addressed in this encounter.  Care Plan  Allergies  Allergen Reactions  . Feraheme [Ferumoxytol] Itching  . Rosiglitazone Maleate Other (See Comments)    REACTION: Difficulty walking, Fatigue, shortness of breath    Outpatient Encounter Medications as of 11/23/2020  Medication Sig  . acetaminophen (TYLENOL) 325 MG tablet Take 2 tablets (650 mg total) by mouth every 6 (six) hours.  . Blood Glucose  Monitoring Suppl (ONETOUCH VERIO) w/Device KIT Check sugar 3 times per day (Patient not taking: Reported on 06/16/2020)  . feeding supplement, ENSURE ENLIVE, (ENSURE ENLIVE) LIQD Take 237 mLs by mouth 3 (three) times daily between meals. (Patient not taking: Reported on 09/27/2020)  . ferrous sulfate 324 MG TBEC Take 1 tablet (324 mg total) by mouth daily with breakfast.  . furosemide (LASIX) 40 MG tablet Take 1 tablet (40 mg total) by mouth daily.  Marland Kitchen gabapentin (NEURONTIN) 100 MG capsule Take 1 capsule (100 mg total) by mouth every 8 (eight) hours.  Marland Kitchen glucose blood (ONETOUCH VERIO) test strip Check blood sugar daily (Patient not taking: Reported on 06/16/2020)  . Incontinence Supply Disposable (INCONTINENCE BRIEF MEDIUM) MISC Wear as needed for incontinence  . Lancet Devices (MICROLET NEXT LANCING DEVICE) MISC 1 each by Does not apply route 3 (three) times daily. Check blood sugar 3 times daily (Patient not taking: Reported on 06/16/2020)  . levothyroxine (SYNTHROID) 150 MCG tablet Take 1 tablet (150 mcg total) by mouth daily.  Marland Kitchen lidocaine (LIDODERM) 5 % Apply one patch to each lower limbs for 12 hours a day as needed for phantom pain. Remove after 12 hours or as directed by MD  . Jonetta Speak LANCETS 30Q MISC Check blood sugar once daily (Patient not taking: Reported on 06/16/2020)  . PROAIR HFA 108 (90 Base) MCG/ACT inhaler INHALE 2 PUFFS INTO THE LUNGS EVERY 6 HOURS AS NEEDED FOR WHEEZING OR SHORTNESS OF BREATH  . rosuvastatin (CRESTOR) 20 MG tablet Take 1 tablet (20 mg total) by mouth daily.   No facility-administered encounter medications on file as of 11/23/2020.  Patient Active Problem List   Diagnosis Date Noted  . Controlled type 2 diabetes mellitus with stable proliferative retinopathy of both eyes, with long-term current use of insulin ([place]) 08/07/2020  . Pseudophakia of both eyes 08/07/2020  . At high risk for pressure injury of skin 12/16/2019  . Uncontrolled type 2 diabetes  mellitus with hyperglycemia ([place]) 11/12/2019  . S/P AKA (above knee amputation) bilateral ([place]) 11/12/2019  . Food insecurity 11/12/2019  . Wound infection   . Critical lower limb ischemia ([place])   . Moderate protein-calorie malnutrition ([place])   . Osteomyelitis ([place]) 09/23/2019  . Left breast mass 06/02/2019  . Stress 06/02/2019  . Peripheral vascular disease ([place]) 07/22/2018  . Dyspnea 03/23/2018  . Impaired functional mobility, balance, gait, and endurance 03/02/2018    Class: Diagnosis of  . Abnormality of gait 02/26/2018  . Iron deficiency anemia 03/19/2017  . Vision disturbance 03/20/2015  . Memory difficulty 03/20/2015  . At high risk for falls 11/10/2012  . DIABETIC  RETINOPATHY 03/17/2007  . Type 2 diabetes mellitus with diabetic neuropathy, unspecified ([place]) 03/17/2007  . Hypothyroidism 10/16/2006  . Type 2 diabetes mellitus ([place]) 10/16/2006  . OBESITY, NOS 10/16/2006  . HYPERTENSION, BENIGN SYSTEMIC 10/16/2006  . GASTROESOPHAGEAL REFLUX, NO ESOPHAGITIS 10/16/2006  . Rheumatoid arthritis ([place]) 10/16/2006  . Disorder of bone and cartilage 10/16/2006  . INCONTINENCE, URGE 10/16/2006    Conditions to be addressed/monitored:  Level of care concerns and powerchair  Care Plan : Clinical Social Work  Updates made by Maurine Cane, LCSW since 11/23/2020 12:00 AM  Problem: Limited support in the home   Priority: High  Long-Range Goal: patient will explore options with the PACE program for additional support in the home   Start Date: 06/02/2020  Expected End Date: 08/18/2020  This Visit's Progress: On track  Recent Progress: Not on track  Priority: High  Assessment, progress & current barriers:  . Patient still open to look into PACE after she gets her electric wheelchair. . Patient unable to consistently perform activities of daily living . needs additional assistance in the home in order to meet this unmet need . Wheelchair bound . Limited support at  home Interventions : . Assessed needs, level of care concerns, basic eligibility and provided education on PACE . Motivational interviewing to address ambivalence of moving forward with PACE  . Discussed PACE with patient will start process with patient and intake worker in 2 to 3 weeks or after powerchair is delivered  . PACE intake worker in [place] [phone]  called Noemi to get  an updated on PACE application ( report patient has not returned any of her calls asked if she would reach out to patient again) . Provided patient with contact number to call Noemi to discuss PACE and benefits of participating in the program . Collaborated with CCM RN for ongoing need . Other interventions provided: Motivational Interviewing, Solution-Focused Strategies, Emotional/Supportive Counseling, and Problem Solving Patient Goals/Self-Care Activities: Over the next 30 days . We will Call Johney Frame [phone] to discuss PACE program   Problem: Barriers to Treatment with getting wheelchair   Goal: obtain wheelchair   Start Date: 11/17/2020  Expected End Date: 12/22/2020  This Visit's Progress: On track  Recent Progress: On track  Priority: High  Current barriers:    Patient does not have power wheelchair ,acknowledges deficits and need support in order to meet this unmet need  Currently unable to  independently self manage needs related to chronic health conditions.  Knowledge Deficits related to short term plan for care coordination needs and long term plans for chronic disease management needs Clinical Goals:  patient will verbalize understanding of plan to obtain needed DME.  . Over the next 30 days, patient will work with [employer]  to address needs related to DME needs. Interventions: . Inter-disciplinary care team collaboration with CCM RN . Reminded patient of April 12th Appointment with PCP . Collaboration with [employer] ;  Joya Gaskins Patient  Assistance to assist with co-pay for  wheelchair . collaboration with PCP regarding development and update of comprehensive plan of care as evidenced by provider attestation and co-signature . Inter-disciplinary care team collaboration (see longitudinal plan of care) Patient Goals/Self-Care Activities: Over the next 30 days . You will receive assistance with covering the co-pay for your powerchair from the [facility] Patient Assistance funds . Return phone calls from [place] . You should receive your chair soon      [name], [place] / [place]   [phone] 9:01 AM

## 2020-11-23 NOTE — Chronic Care Management (AMB) (Signed)
    Clinical Social Work  Care Management  Unsuccessful Phone Outreach    11/23/2020 Name: JIMI GIZA MRN: [id number] DOB: [birth year]  Journee KEYARRA RENDALL is a 75 y.o. year old female who is a primary care patient of [name], MD .   F/U phone call today to assess needs, and progress with care plan goals and to provide update on powerchair.  Telephone outreach was unsuccessful A HIPPA compliant phone message was left for the patient providing contact information and requesting a return call.   Plan:LCSW will wait for return call. Will reach out to patient again in the next 5 to 7 days.  Review of patient status, including review of consultants reports, relevant laboratory and other test results, and collaboration with appropriate care team members and the patient's provider was performed as part of comprehensive patient evaluation and provision of care management services.     [name], [place] / [place]   [phone] 8:26 AM

## 2020-11-28 ENCOUNTER — Encounter: Payer: Self-pay | Admitting: Family Medicine

## 2020-11-28 ENCOUNTER — Ambulatory Visit (INDEPENDENT_AMBULATORY_CARE_PROVIDER_SITE_OTHER): Payer: Medicare Other

## 2020-11-28 ENCOUNTER — Ambulatory Visit (INDEPENDENT_AMBULATORY_CARE_PROVIDER_SITE_OTHER): Payer: Medicare Other | Admitting: Family Medicine

## 2020-11-28 ENCOUNTER — Other Ambulatory Visit: Payer: Self-pay

## 2020-11-28 VITALS — BP 140/80 | HR 76

## 2020-11-28 DIAGNOSIS — Z89611 Acquired absence of right leg above knee: Secondary | ICD-10-CM | POA: Diagnosis not present

## 2020-11-28 DIAGNOSIS — Z794 Long term (current) use of insulin: Secondary | ICD-10-CM | POA: Diagnosis not present

## 2020-11-28 DIAGNOSIS — I10 Essential (primary) hypertension: Secondary | ICD-10-CM | POA: Diagnosis not present

## 2020-11-28 DIAGNOSIS — J849 Interstitial pulmonary disease, unspecified: Secondary | ICD-10-CM | POA: Insufficient documentation

## 2020-11-28 DIAGNOSIS — M059 Rheumatoid arthritis with rheumatoid factor, unspecified: Secondary | ICD-10-CM | POA: Diagnosis not present

## 2020-11-28 DIAGNOSIS — Z862 Personal history of diseases of the blood and blood-forming organs and certain disorders involving the immune mechanism: Secondary | ICD-10-CM | POA: Diagnosis not present

## 2020-11-28 DIAGNOSIS — D509 Iron deficiency anemia, unspecified: Secondary | ICD-10-CM

## 2020-11-28 DIAGNOSIS — R0989 Other specified symptoms and signs involving the circulatory and respiratory systems: Secondary | ICD-10-CM

## 2020-11-28 DIAGNOSIS — E1140 Type 2 diabetes mellitus with diabetic neuropathy, unspecified: Secondary | ICD-10-CM

## 2020-11-28 DIAGNOSIS — Z89612 Acquired absence of left leg above knee: Secondary | ICD-10-CM | POA: Diagnosis not present

## 2020-11-28 DIAGNOSIS — E113553 Type 2 diabetes mellitus with stable proliferative diabetic retinopathy, bilateral: Secondary | ICD-10-CM

## 2020-11-28 DIAGNOSIS — Z23 Encounter for immunization: Secondary | ICD-10-CM | POA: Diagnosis not present

## 2020-11-28 DIAGNOSIS — E039 Hypothyroidism, unspecified: Secondary | ICD-10-CM

## 2020-11-28 LAB — POCT GLYCOSYLATED HEMOGLOBIN (HGB A1C): HbA1c, POC (controlled diabetic range): 6 % (ref 0.0–7.0)

## 2020-11-28 NOTE — Assessment & Plan Note (Addendum)
At goal, not on medications currently other than lasix

## 2020-11-28 NOTE — Assessment & Plan Note (Signed)
Not on any rheum medications presently, with continued deformities of her hands from untreated RA. She is interested in following up with rheumatology. Provided phone # for her to call and schedule with Dr. Estanislado Pandy, who she saw previously.

## 2020-11-28 NOTE — Assessment & Plan Note (Signed)
Update TSH today, titrate synthroid as needed based on results

## 2020-11-28 NOTE — Assessment & Plan Note (Signed)
Chronic crackles, question of ILD. Patient never showed for prior CT scan appointment. Rescheduled CT scan appointment, will also refer to pulm

## 2020-11-28 NOTE — Assessment & Plan Note (Signed)
Update CBC, iron studies today

## 2020-11-28 NOTE — Assessment & Plan Note (Signed)
A1c today 6.0 - excellent control without any medications. Continue to monitor A1cs at future visits

## 2020-11-28 NOTE — Progress Notes (Signed)
   Covid-19 Vaccination Clinic  Name:  SHEQUILA NEGLIA    MRN: [id number] DOB: Jul 21, 1946  11/28/2020  Ms. Bogus was observed post Covid-19 immunization for 15 minutes without incident. She was provided with Vaccine Information Sheet and instruction to access the V-Safe system.   Ms. Koral was instructed to call 911 with any severe reactions post vaccine: Marland Kitchen Difficulty breathing  . Swelling of face and throat  . A fast heartbeat  . A bad rash all over body  . Dizziness and weakness

## 2020-11-28 NOTE — Patient Instructions (Addendum)
It was great to see you again today!  For your colonoscopy, call [facility]: [phone]   Call rheumatology office at [phone] to schedule an appointment   COVID booster today.  Getting CT scan of your chest and referring to lung doctor.  Follow up with me in 3 months, sooner if needed.  Be well, Dr. [name]

## 2020-11-28 NOTE — Assessment & Plan Note (Signed)
Will hopefully be getting her motorized wheelchair soon in the next few weeks This will greatly improve her quality of life.

## 2020-11-28 NOTE — Progress Notes (Signed)
  Date of Visit: 11/28/2020   SUBJECTIVE:   HPI:  [name] presents today for routine follow up. It has been a while since I saw her. She did not bring her medications today and does not know the names of them, so our medication reconciliation was based on her best memory.  Anemia - taking iron 325mg  daily. Tolerating well. Has not had colonoscopy.  Hyperlipidemia - taking crestor 20mg  daily. Due for lipids.  Impaired mobility - is s/p bilateral AKAs. Has been working with [place] and CCM to get electric motorized wheelchair and anticipates she will have this in the next few weeks. Is contemplating switching her care to PACE.  RA - not on any rheum medications presently. Desires to follow up with rheum. Has not seen them in some time.  Chronic crackles - seen by [facility] NP and had crackles noted and underwent CXR which was concerning for pneumonia. She is taking levaquin 500mg  twice daily for 10 days. She denies respiratory symptoms (no cough, shortness of breath, trouble breathing). Uses albuterol inhaler occasionally  OBJECTIVE:   BP 140/80   Pulse 76   SpO2 98%  Gen: no acute distress, pleasant, cooperative HEENT: normocephalic, atraumatic  Heart: regular rate and rhythm, no murmur Lungs: bibasilar crackles midway up lung fields Neuro: alert, grossly nonfocal, speech normal Ext: s/p bilateral lower extremity AKA. Hands with deformation of RA - ulnar deviation bilaterally  ASSESSMENT/PLAN:   Health maintenance:  -gave phone # for [facility] so she can call and schedule colonoscopy consult -COVID booster given today  Type 2 diabetes mellitus ([place]) A1c today 6.0 - excellent control without any medications. Continue to monitor A1cs at future visits  S/P AKA (above knee amputation) bilateral ([facility]) Will hopefully be getting her motorized wheelchair soon in the next few weeks This will greatly improve her quality of life.  Hypothyroidism Update TSH today, titrate  synthroid as needed based on results  HYPERTENSION, BENIGN SYSTEMIC At goal, not on medications currently other than lasix  Iron deficiency anemia Update CBC, iron studies today  Rheumatoid arthritis ([place]) Not on any rheum medications presently, with continued deformities of her hands from untreated RA. She is interested in following up with rheumatology. Provided phone # for her to call and schedule with Dr. Estanislado Pandy, who she saw previously.  Bibasilar crackles Chronic crackles, question of ILD. Patient never showed for prior CT scan appointment. Rescheduled CT scan appointment, will also refer to pulm  FOLLOW UP: Follow up in 3 mos for above issues, sooner if needed  Note - advised patient that I think PACE would be a great program for her, given their thorough wraparound care and her ability to socialize with other patients. She is contemplating making this change.  [place]. [name], [place]

## 2020-11-29 LAB — CMP14+EGFR
ALT: 10 IU/L (ref 0–32)
AST: 10 IU/L (ref 0–40)
Albumin/Globulin Ratio: 0.4 — ABNORMAL LOW (ref 1.2–2.2)
Albumin: 2.9 g/dL — ABNORMAL LOW (ref 3.7–4.7)
Alkaline Phosphatase: 75 IU/L (ref 44–121)
BUN/Creatinine Ratio: 27 (ref 12–28)
BUN: 35 mg/dL — ABNORMAL HIGH (ref 8–27)
Bilirubin Total: 0.3 mg/dL (ref 0.0–1.2)
CO2: 19 mmol/L — ABNORMAL LOW (ref 20–29)
Calcium: 9.2 mg/dL (ref 8.7–10.3)
Chloride: 105 mmol/L (ref 96–106)
Creatinine, Ser: 1.29 mg/dL — ABNORMAL HIGH (ref 0.57–1.00)
Globulin, Total: 6.6 g/dL — ABNORMAL HIGH (ref 1.5–4.5)
Glucose: 76 mg/dL (ref 65–99)
Potassium: 4.4 mmol/L (ref 3.5–5.2)
Sodium: 138 mmol/L (ref 134–144)
Total Protein: 9.5 g/dL — ABNORMAL HIGH (ref 6.0–8.5)
eGFR: 43 mL/min/{1.73_m2} — ABNORMAL LOW (ref >59)

## 2020-11-29 LAB — LIPID PANEL
Chol/HDL Ratio: 2.1 ratio (ref 0.0–4.4)
Cholesterol, Total: 89 mg/dL — ABNORMAL LOW (ref 100–199)
HDL: 42 mg/dL (ref >39)
LDL Chol Calc (NIH): 33 mg/dL (ref 0–99)
Triglycerides: 59 mg/dL (ref 0–149)
VLDL Cholesterol Cal: 14 mg/dL (ref 5–40)

## 2020-11-29 LAB — TSH: TSH: 2.23 u[IU]/mL (ref 0.450–4.500)

## 2020-11-29 LAB — CBC
Hematocrit: 27.4 % — ABNORMAL LOW (ref 34.0–46.6)
Hemoglobin: 8.7 g/dL — ABNORMAL LOW (ref 11.1–15.9)
MCH: 28.4 pg (ref 26.6–33.0)
MCHC: 31.8 g/dL (ref 31.5–35.7)
MCV: 90 fL (ref 79–97)
Platelets: 256 10*3/uL (ref 150–450)
RBC: 3.06 x10E6/uL — ABNORMAL LOW (ref 3.77–5.28)
RDW: 11.8 % (ref 11.7–15.4)
WBC: 6.2 10*3/uL (ref 3.4–10.8)

## 2020-11-29 LAB — IRON AND TIBC
Iron Saturation: 26 % (ref 15–55)
Iron: 37 ug/dL (ref 27–139)
Total Iron Binding Capacity: 141 ug/dL — ABNORMAL LOW (ref 250–450)
UIBC: 104 ug/dL — ABNORMAL LOW (ref 118–369)

## 2020-11-29 LAB — FERRITIN: Ferritin: 1362 ng/mL — ABNORMAL HIGH (ref 15–150)

## 2020-12-04 ENCOUNTER — Telehealth: Payer: Self-pay | Admitting: Family Medicine

## 2020-12-04 DIAGNOSIS — Z862 Personal history of diseases of the blood and blood-forming organs and certain disorders involving the immune mechanism: Secondary | ICD-10-CM

## 2020-12-04 NOTE — Telephone Encounter (Signed)
Attempted to reach patient via phone to discuss her lab results.  Her lipids overall look good. TSH is normal.  Renal function is down slightly from the last time it was checked about a year ago.  I will plan to recheck this when I see her next in 3 months.  Continues to be anemic with a hemoglobin of 8.7.  She is taking iron every day.  Her iron studies are suggestive of anemia of chronic disease, with a markedly elevated ferritin likely related to her rheumatoid arthritis.  She has had low iron saturations in the past consistent with iron deficiency anemia, but I suspect the iron deficiency part of her anemia is currently well treated given her current normal iron sat.  She also continues to have an elevated protein gap.  In the past we did an immunofixation evaluation to work this up, which was unremarkable and just suggestive of systemic inflammation.  Taken together, I think I would like to have her see hematology for recommendations on whether any further work-up is required.  Patient did not answer the phone when I called her.  I left a HIPAA compliant voicemail asking that she call back at her convenience.  I am on vacation this week, and Dr. Owens Shark is covering for me. When patient calls back, please send me a message and either I or Dr. Owens Shark will call her back.  [name], MD

## 2020-12-05 ENCOUNTER — Telehealth: Payer: Self-pay | Admitting: Internal Medicine

## 2020-12-05 NOTE — Telephone Encounter (Signed)
Received a new hem referral from Dr. [name] for anemia. [name] has been cld and scheduled to see Dr. Julien Nordmann on 4/26 at 11:45am w/labs at 11:15am. Pt aware to arrive 15 minutes early.

## 2020-12-05 NOTE — Telephone Encounter (Signed)
Late entry, spoke with patient yesterday 4/18. Patient agreeable to hematology referral. Referral placed.  [name], MD

## 2020-12-08 ENCOUNTER — Other Ambulatory Visit: Payer: Self-pay

## 2020-12-08 ENCOUNTER — Encounter (HOSPITAL_COMMUNITY): Payer: Self-pay

## 2020-12-08 ENCOUNTER — Ambulatory Visit (HOSPITAL_COMMUNITY)
Admission: RE | Admit: 2020-12-08 | Discharge: 2020-12-08 | Disposition: A | Discharge status: Home / Self Care | Payer: Medicare Other | Source: Ambulatory Visit | Attending: Family Medicine | Admitting: Family Medicine

## 2020-12-08 DIAGNOSIS — R0989 Other specified symptoms and signs involving the circulatory and respiratory systems: Secondary | ICD-10-CM | POA: Insufficient documentation

## 2020-12-08 DIAGNOSIS — M4854XA Collapsed vertebra, not elsewhere classified, thoracic region, initial encounter for fracture: Secondary | ICD-10-CM | POA: Diagnosis not present

## 2020-12-08 DIAGNOSIS — I2510 Atherosclerotic heart disease of native coronary artery without angina pectoris: Secondary | ICD-10-CM | POA: Diagnosis not present

## 2020-12-08 DIAGNOSIS — J479 Bronchiectasis, uncomplicated: Secondary | ICD-10-CM | POA: Diagnosis not present

## 2020-12-10 ENCOUNTER — Emergency Department (HOSPITAL_COMMUNITY)
Admission: EM | Admit: 2020-12-10 | Discharge: 2020-12-10 | Disposition: A | Discharge status: Home / Self Care | Payer: Medicare Other | Attending: Emergency Medicine | Admitting: Emergency Medicine

## 2020-12-10 ENCOUNTER — Encounter (HOSPITAL_COMMUNITY): Payer: Self-pay | Admitting: Emergency Medicine

## 2020-12-10 ENCOUNTER — Other Ambulatory Visit: Payer: Self-pay

## 2020-12-10 DIAGNOSIS — Z23 Encounter for immunization: Secondary | ICD-10-CM | POA: Insufficient documentation

## 2020-12-10 DIAGNOSIS — T24232A Burn of second degree of left lower leg, initial encounter: Secondary | ICD-10-CM | POA: Diagnosis not present

## 2020-12-10 DIAGNOSIS — E113553 Type 2 diabetes mellitus with stable proliferative diabetic retinopathy, bilateral: Secondary | ICD-10-CM | POA: Diagnosis not present

## 2020-12-10 DIAGNOSIS — U071 COVID-19: Secondary | ICD-10-CM | POA: Diagnosis not present

## 2020-12-10 DIAGNOSIS — T2127XA Burn of second degree of female genital region, initial encounter: Secondary | ICD-10-CM | POA: Insufficient documentation

## 2020-12-10 DIAGNOSIS — I10 Essential (primary) hypertension: Secondary | ICD-10-CM | POA: Insufficient documentation

## 2020-12-10 DIAGNOSIS — T24231A Burn of second degree of right lower leg, initial encounter: Secondary | ICD-10-CM | POA: Diagnosis not present

## 2020-12-10 DIAGNOSIS — T24211A Burn of second degree of right thigh, initial encounter: Secondary | ICD-10-CM | POA: Diagnosis not present

## 2020-12-10 DIAGNOSIS — E039 Hypothyroidism, unspecified: Secondary | ICD-10-CM | POA: Insufficient documentation

## 2020-12-10 DIAGNOSIS — X110XXA Contact with hot water in bath or tub, initial encounter: Secondary | ICD-10-CM | POA: Insufficient documentation

## 2020-12-10 DIAGNOSIS — T310 Burns involving less than 10% of body surface: Secondary | ICD-10-CM | POA: Diagnosis not present

## 2020-12-10 DIAGNOSIS — T300 Burn of unspecified body region, unspecified degree: Secondary | ICD-10-CM

## 2020-12-10 DIAGNOSIS — Z79899 Other long term (current) drug therapy: Secondary | ICD-10-CM | POA: Diagnosis not present

## 2020-12-10 DIAGNOSIS — T2125XA Burn of second degree of buttock, initial encounter: Secondary | ICD-10-CM | POA: Diagnosis not present

## 2020-12-10 DIAGNOSIS — Y272XXA Contact with hot fluids, undetermined intent, initial encounter: Secondary | ICD-10-CM | POA: Diagnosis not present

## 2020-12-10 LAB — CBC WITH DIFFERENTIAL/PLATELET
Abs Immature Granulocytes: 0.01 10*3/uL (ref 0.00–0.07)
Basophils Absolute: 0 10*3/uL (ref 0.0–0.1)
Basophils Relative: 0 %
Eosinophils Absolute: 0.2 10*3/uL (ref 0.0–0.5)
Eosinophils Relative: 4 %
HCT: 30.6 % — ABNORMAL LOW (ref 36.0–46.0)
Hemoglobin: 9.2 g/dL — ABNORMAL LOW (ref 12.0–15.0)
Immature Granulocytes: 0 %
Lymphocytes Relative: 39 %
Lymphs Abs: 1.9 10*3/uL (ref 0.7–4.0)
MCH: 28 pg (ref 26.0–34.0)
MCHC: 30.1 g/dL (ref 30.0–36.0)
MCV: 93.3 fL (ref 80.0–100.0)
Monocytes Absolute: 0.3 10*3/uL (ref 0.1–1.0)
Monocytes Relative: 6 %
Neutro Abs: 2.4 10*3/uL (ref 1.7–7.7)
Neutrophils Relative %: 51 %
Platelets: 214 10*3/uL (ref 150–400)
RBC: 3.28 MIL/uL — ABNORMAL LOW (ref 3.87–5.11)
RDW: 13.2 % (ref 11.5–15.5)
WBC: 4.8 10*3/uL (ref 4.0–10.5)
nRBC: 0 % (ref 0.0–0.2)

## 2020-12-10 LAB — BASIC METABOLIC PANEL
Anion gap: 5 (ref 5–15)
BUN: 35 mg/dL — ABNORMAL HIGH (ref 8–23)
CO2: 24 mmol/L (ref 22–32)
Calcium: 8.5 mg/dL — ABNORMAL LOW (ref 8.9–10.3)
Chloride: 106 mmol/L (ref 98–111)
Creatinine, Ser: 1.06 mg/dL — ABNORMAL HIGH (ref 0.44–1.00)
GFR, Estimated: 55 mL/min — ABNORMAL LOW (ref >60)
Glucose, Bld: 110 mg/dL — ABNORMAL HIGH (ref 70–99)
Potassium: 4.5 mmol/L (ref 3.5–5.1)
Sodium: 135 mmol/L (ref 135–145)

## 2020-12-10 MED ORDER — LACTATED RINGERS IV BOLUS
1000.0000 mL | Freq: Once | INTRAVENOUS | Status: AC
  Administered 2020-12-10: 1000 mL via INTRAVENOUS

## 2020-12-10 MED ORDER — ONDANSETRON HCL 4 MG/2ML IJ SOLN
4.0000 mg | Freq: Once | INTRAMUSCULAR | Status: AC
  Administered 2020-12-10: 4 mg via INTRAVENOUS
  Filled 2020-12-10: qty 2

## 2020-12-10 MED ORDER — TETANUS-DIPHTH-ACELL PERTUSSIS 5-2.5-18.5 LF-MCG/0.5 IM SUSY
0.5000 mL | PREFILLED_SYRINGE | Freq: Once | INTRAMUSCULAR | Status: AC
  Administered 2020-12-10: 0.5 mL via INTRAMUSCULAR
  Filled 2020-12-10: qty 0.5

## 2020-12-10 MED ORDER — MORPHINE SULFATE (PF) 4 MG/ML IV SOLN
4.0000 mg | Freq: Once | INTRAVENOUS | Status: AC
  Administered 2020-12-10: 4 mg via INTRAVENOUS
  Filled 2020-12-10: qty 1

## 2020-12-10 NOTE — ED Notes (Signed)
Made aware by provider that pt would be transferred to [facility] at this time

## 2020-12-10 NOTE — ED Notes (Signed)
Provider at bedside at this time

## 2020-12-10 NOTE — ED Notes (Signed)
purewick placed at this time 
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
## 2020-12-10 NOTE — ED Notes (Signed)
Received verbal report from [place] at this time

## 2020-12-11 ENCOUNTER — Telehealth (HOSPITAL_COMMUNITY): Payer: Self-pay

## 2020-12-11 ENCOUNTER — Other Ambulatory Visit: Payer: Self-pay | Admitting: Physician Assistant

## 2020-12-11 DIAGNOSIS — D649 Anemia, unspecified: Secondary | ICD-10-CM

## 2020-12-11 LAB — RESP PANEL BY RT-PCR (FLU A&B, COVID) ARPGX2
Influenza A by PCR: NEGATIVE
Influenza B by PCR: NEGATIVE
SARS Coronavirus 2 by RT PCR: POSITIVE — AB

## 2020-12-12 ENCOUNTER — Inpatient Hospital Stay: Payer: Medicare Other

## 2020-12-12 ENCOUNTER — Inpatient Hospital Stay: Payer: Medicare Other | Attending: Internal Medicine | Admitting: Internal Medicine

## 2020-12-13 ENCOUNTER — Ambulatory Visit: Payer: Self-pay

## 2020-12-13 ENCOUNTER — Other Ambulatory Visit: Payer: Self-pay | Admitting: Family Medicine

## 2020-12-13 DIAGNOSIS — D508 Other iron deficiency anemias: Secondary | ICD-10-CM

## 2020-12-13 DIAGNOSIS — D649 Anemia, unspecified: Secondary | ICD-10-CM

## 2020-12-13 DIAGNOSIS — Z139 Encounter for screening, unspecified: Secondary | ICD-10-CM
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
## 2020-12-14 ENCOUNTER — Telehealth (HOSPITAL_COMMUNITY): Payer: Self-pay
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
## 2020-12-14 NOTE — Patient Instructions (Signed)
Visit Information  [name]  it was nice speaking with you. Please call me directly [phone] if you have questions about the goals we discussed.  Goals Addressed              This Visit's Progress   .  I need renewed transportation form (pt-stated)        Timeframe:  Short-Term Goal Priority:  High Start Date:     12/13/20                        Expected End Date:    01/16/21                   Patient will self administer medications as prescribed Patient will attend all scheduled provider appointments Patient will call pharmacy for medication refills Patient will call provider office for new concerns or questions       The patient verbalized understanding of instructions, educational materials, and care plan provided today and declined offer to receive copy of patient instructions, educational materials, and care plan.   Follow up Plan: Patient would like continued follow-up.  CCM RNCM will outreach the patient within the next 2 weeks.  Patient will call office if needed prior to next encounter  [name], RN  [phone]

## 2020-12-15 ENCOUNTER — Telehealth: Payer: Self-pay

## 2020-12-15 ENCOUNTER — Other Ambulatory Visit: Payer: Self-pay | Admitting: Family Medicine

## 2020-12-15 MED ORDER — MISC. DEVICES MISC: 1 refills | Status: DC

## 2020-12-15 MED ORDER — MISC. DEVICES MISC: 0 refills | Status: DC
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
## 2020-12-18 ENCOUNTER — Ambulatory Visit: Payer: Medicare Other

## 2020-12-18 NOTE — Patient Instructions (Signed)
Visit Information  [name]  it was nice speaking with you. Please call me directly [phone] if you have questions about the goals we discussed.  Goals Addressed              This Visit's Progress   .  Take care of my wounds (pt-stated)        Timeframe:  Short-Term Goal Priority:  High Start Date:       12/13/20                      Expected End Date:       03/16/21                Patient Goals/Self care activities: Patient will self administer medications as prescribed Patient will attend all scheduled provider appointments Patient will call pharmacy for medication refills Patient will call provider office for new concerns or questions Patient will follow instructions for wound care Patient will call Wake Burn units for any Questions        The patient verbalized understanding of instructions, educational materials, and care plan provided today and declined offer to receive copy of patient instructions, educational materials, and care plan.   Follow up Plan: Patient would like continued follow-up.  CCM RNCM will outreach the patient within the next 7 days.  Patient will call office if needed prior to next encounter  [name], RN  [phone]
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
## 2020-12-19 ENCOUNTER — Telehealth: Payer: Self-pay | Admitting: *Deleted
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
## 2020-12-19 NOTE — Telephone Encounter (Signed)
   Telephone encounter was:  Successful.  12/19/2020 Name: [name] MRN: [id number] DOB: 12-02-1945  [name] is a 75 y.o. year old female who is a primary care patient of [name], MD . The community resource team was consulted for assistance with Transportation Needs   Care guide performed the following interventions: Patient provided with information about care guide support team and interviewed to confirm resource needs.  Follow Up Plan:  No further follow up planned at this time. The patient has been provided with needed resources. Patient is double amputee and needs the doctor or social woker to complete part B of the application to gso access so the patient can recertify    [place], Care Management  [phone] [address] , [place] [address] Email : [email or web address]

## 2020-12-21 ENCOUNTER — Other Ambulatory Visit: Payer: Self-pay

## 2020-12-26 DIAGNOSIS — M069 Rheumatoid arthritis, unspecified: Secondary | ICD-10-CM | POA: Diagnosis not present

## 2020-12-26 DIAGNOSIS — Z749 Problem related to care provider dependency, unspecified: Secondary | ICD-10-CM | POA: Diagnosis not present

## 2020-12-26 DIAGNOSIS — R3589 Other polyuria: Secondary | ICD-10-CM | POA: Diagnosis not present

## 2020-12-27 ENCOUNTER — Ambulatory Visit: Payer: Medicare Other | Admitting: Licensed Clinical Social Worker

## 2020-12-27 MED ORDER — ROSUVASTATIN CALCIUM 20 MG PO TABS: 20.0000 mg | ORAL_TABLET | Freq: Every day | ORAL | 3 refills | Status: DC

## 2020-12-27 MED ORDER — LEVOTHYROXINE SODIUM 150 MCG PO TABS: 150.0000 ug | ORAL_TABLET | Freq: Every day | ORAL | 3 refills | Status: DC

## 2020-12-27 NOTE — Patient Instructions (Signed)
Visit Information  Goals Addressed            This Visit's Progress   . Additional Support in the Home   On track    Patient Goals/Self-Care Activities: . We will Call [name] [phone] to discuss PACE program         and arrange a site visit for you after SCAT is approved    . Find Help in My Community       Timeframe:  Short-Term Goal Priority:  High Start Date:    11/17/20                         Expected End Date:  12/22/20                     . You will receive assistance with covering the co-pay for your powerchair from the [facility] Patient Assistance funds . Congratulations on getting your wheelchair      . transportation       Patient Goals/Self-Care Activities: Over the next 30 days . I will coordinate with your doctor to assist with getting the SCAT application . SCAT application has been faxed they will contact you to schedule the assessment      Patient verbalizes understanding of instructions provided today and agrees to view in [place].   I will follow up with you in 2 to 3 weeks  [name], [place]

## 2020-12-27 NOTE — Chronic Care Management (AMB) (Signed)
Care Management   Clinical Social Work Note  12/27/2020 Name: [name] MRN: [id number] DOB: 06/19/1946  [name] is a 75 y.o. year old female who is a primary care patient of [name], MD. The CCM team was consulted to assist the patient with chronic disease management and/or care coordination needs related to: Transportation Needs  and Level of Care Concerns.   Engaged with patient by telephone for follow up visit in response to provider referral for social work chronic care management and care coordination services.   Consent to Services:  The patient was given information about Chronic Care Management services, agreed to services, and gave verbal consent prior to initiation of services.  Please see initial visit note for detailed documentation.   Patient agreed to services and consent obtained.   Assessment: Patient is making progress with and has received her wheelchair , but continues to have difficulty with transporation and needs door to door service from SCAT.Marland Kitchen See Care Plan below for interventions and patient self-care actives. Recent life changes /stressors: electric wheelchair delivered 12/26/20 Recommendation: Patient may benefit from, and is in agreement to continue to explore PACE program for additional support.  Will make a decision once she is able to go for a site visit once the SCAT is approved.  Follow up Plan: Patient would like continued follow-up.  CCM LCSW will follow up with patient in 2 to 3 weeks. Patient will call office if needed prior to next encounter.    Review of patient past medical history, allergies, medications, and health status, including review of relevant consultants reports was performed today as part of a comprehensive evaluation and provision of chronic care management and care coordination services.     SDOH (Social Determinants of Health) assessments and interventions performed:    Advanced Directives Status: Not addressed in  this encounter.  CCM Care Plan  Allergies  Allergen Reactions  . Feraheme [Ferumoxytol] Itching  . Rosiglitazone Maleate Other (See Comments)    REACTION: Difficulty walking, Fatigue, shortness of breath    Outpatient Encounter Medications as of 12/27/2020  Medication Sig  . acetaminophen (TYLENOL) 325 MG tablet Take 2 tablets (650 mg total) by mouth every 6 (six) hours.  . Blood Glucose Monitoring Suppl (ONETOUCH VERIO) w/Device KIT Check sugar 3 times per day (Patient not taking: Reported on 06/16/2020)  . feeding supplement, ENSURE ENLIVE, (ENSURE ENLIVE) LIQD Take 237 mLs by mouth 3 (three) times daily between meals. (Patient not taking: Reported on 09/27/2020)  . ferrous sulfate 324 (65 Fe) MG TBEC TAKE 1 TABLET BY MOUTH DAILY WITH BREAKFAST  . furosemide (LASIX) 40 MG tablet TAKE 1 TABLET BY MOUTH EVERY DAY  . gabapentin (NEURONTIN) 100 MG capsule TAKE 1 CAPSULE (100 MG TOTAL) BY MOUTH EVERY 8 (EIGHT) HOURS.  . glucose blood (ONETOUCH VERIO) test strip Check blood sugar daily (Patient not taking: Reported on 06/16/2020)  . Incontinence Supply Disposable (INCONTINENCE BRIEF MEDIUM) MISC Wear as needed for incontinence  . Lancet Devices (MICROLET NEXT LANCING DEVICE) MISC 1 each by Does not apply route 3 (three) times daily. Check blood sugar 3 times daily (Patient not taking: Reported on 06/16/2020)  . levothyroxine (SYNTHROID) 150 MCG tablet Take 1 tablet (150 mcg total) by mouth daily.  Marland Kitchen lidocaine (LIDODERM) 5 % Apply one patch to each lower limbs for 12 hours a day as needed for phantom pain. Remove after 12 hours or as directed by MD  . Misc. Devices MISC Mepilex silver  sulfate-foam bandage 6x6" change dressing weekly  . ONETOUCH DELICA LANCETS 31S MISC Check blood sugar once daily (Patient not taking: Reported on 06/16/2020)  . PROAIR HFA 108 (90 Base) MCG/ACT inhaler INHALE 2 PUFFS INTO THE LUNGS EVERY 6 HOURS AS NEEDED FOR WHEEZING OR SHORTNESS OF BREATH  . rosuvastatin (CRESTOR)  20 MG tablet Take 1 tablet (20 mg total) by mouth daily.   No facility-administered encounter medications on file as of 12/27/2020.    Patient Active Problem List   Diagnosis Date Noted  . Bibasilar crackles 11/28/2020  . Controlled type 2 diabetes mellitus with stable proliferative retinopathy of both eyes, with long-term current use of insulin ([place]) 08/07/2020  . Pseudophakia of both eyes 08/07/2020  . At high risk for pressure injury of skin 12/16/2019  . Uncontrolled type 2 diabetes mellitus with hyperglycemia ([place]) 11/12/2019  . S/P AKA (above knee amputation) bilateral ([place]) 11/12/2019  . Food insecurity 11/12/2019  . Wound infection   . Critical lower limb ischemia ([place])   . Moderate protein-calorie malnutrition ([place])   . Left breast mass 06/02/2019  . Stress 06/02/2019  . Peripheral vascular disease ([place]) 07/22/2018  . Dyspnea 03/23/2018  . Impaired functional mobility, balance, gait, and endurance 03/02/2018    Class: Diagnosis of  . Abnormality of gait 02/26/2018  . Iron deficiency anemia 03/19/2017  . Vision disturbance 03/20/2015  . Memory difficulty 03/20/2015  . At high risk for falls 11/10/2012  . DIABETIC  RETINOPATHY 03/17/2007  . Type 2 diabetes mellitus with diabetic neuropathy, unspecified ([place]) 03/17/2007  . Hypothyroidism 10/16/2006  . Type 2 diabetes mellitus ([place]) 10/16/2006  . OBESITY, NOS 10/16/2006  . HYPERTENSION, BENIGN SYSTEMIC 10/16/2006  . GASTROESOPHAGEAL REFLUX, NO ESOPHAGITIS 10/16/2006  . Rheumatoid arthritis ([place]) 10/16/2006  . Disorder of bone and cartilage 10/16/2006  . INCONTINENCE, URGE 10/16/2006    Conditions to be addressed/monitored: Transportation and Level of care concerns  Care Plan : Clinical Social Work  Updates made by Maurine Cane, LCSW since 12/27/2020 12:00 AM  Problem: Limited support in the home   Priority: High  Long-Range Goal: patient will explore options with the PACE program for additional support in the home    Start Date: 06/02/2020  Expected End Date: 08/18/2021  Recent Progress: On track  Priority: High  Assessment, progress & current barriers:  . Patient still open to look into PACE after she gets her electric wheelchair. . Patient unable to consistently perform activities of daily living . needs additional assistance in the home in order to meet this unmet need . Wheelchair bound . Limited support at home Interventions : . Assessed needs, level of care concerns, basic eligibility and provided education on PACE . Discussed PACE program with patient, she is willing to explore this process after she is approved for SCAT door to door  . Patient would like to go visit PACE before making a decision, states monthly fee will be approx $400 per  . PACE intake worker is Johney Frame [phone]   . Provided patient with contact number to call Noemi to discuss PACE and benefits of participating in the program . Collaborated with CCM RN for ongoing need . Other interventions provided: Solution-Focused Strategies, Emotional/Supportive Counseling, and Problem Solving Patient Goals/Self-Care Activities: Over the next 30 to 45 days . We will Call Johney Frame [phone] to discuss PACE program         and arrange a site visit for you after SCAT is approved   Problem: Barriers to  Treatment with getting wheelchair   Goal: obtain wheelchair Completed 12/27/2020  Start Date: 11/17/2020  Expected End Date: 12/22/2020  Recent Progress: On track  Priority: High  Current barriers:  Goal complete wheelchair was delivered 12/26/2020  Patient does not have power wheelchair ,acknowledges deficits and need support in order to meet this unmet need Clinical Goals:  patient will verbalize understanding of plan to obtain needed DME.  . Over the next 30 days, patient will work with [employer]  to address needs related to DME needs. Interventions: . Inter-disciplinary care team collaboration with CCM RN . Collaboration  with [employer] ;  Joya Gaskins Patient  Assistance to assist with co-pay for wheelchair . collaboration with PCP regarding development and update of comprehensive plan of care as evidenced by provider attestation and co-signature . Inter-disciplinary care team collaboration (see longitudinal plan of care) Patient Goals/Self-Care Activities: Over the next 30 days . You will receive assistance with covering the co-pay for your powerchair from the [facility] Patient Assistance funds . Congratulations on getting your wheelchair   Problem: Barriers to Treatment with Transporation   Goal: Get approved for SCAT door to door service   Start Date: 12/27/2020  This Visit's Progress: On track  Priority: High  Current barriers:   .  Transportation and ADL IADL limitations Clinical Goals: Patient will work with LCSW to address needs related to SCAT Clinical Interventions:  . Collaboration with [name], MD regarding development and update of comprehensive plan of care as evidenced by provider attestation and co-signature . Inter-disciplinary care team collaboration (see longitudinal plan of care) . Assessment of needs, barriers , agencies contacted, as well as how impacting  . Review various resources, discussed options and provided patient information about SCAT transportation . Collaborated with PCP and SCAT for patient's SCAT application ( Part B of application completed by PCP 12/27/2020) . Assisted patient with completing application received verbal approval via phone to assist with application . Part A & B of SCAT application faxed by LCSW 12/27/2020 . Other interventions provided:Solution-Focused Strategies and Problem Solving [place]  Patient Goals/Self-Care Activities: Over the next 30 days . SCAT application has been faxed they will contact you to schedule the assessment     [name], [place] / [place]    [phone] 11:40 AM

## 2020-12-28 ENCOUNTER — Ambulatory Visit: Payer: Medicare Other

## 2020-12-29 NOTE — Patient Instructions (Signed)
Visit Information  [name]  it was nice speaking with you. Please call me directly [phone] if you have questions about the goals we discussed.  Goals Addressed              This Visit's Progress   .  Take care of my wounds (pt-stated)        Timeframe:  Short-Term Goal Priority:  High Start Date:       12/13/20                      Expected End Date:    03/16/21                Patient Goals/Self care activities: Patient will self administer medications as prescribed Patient will attend all scheduled provider appointments Patient will call pharmacy for medication refills Patient will call provider office for new concerns or questions Patient will follow instructions for wound care Patient will call Wake Burn units for any Questions        The patient verbalized understanding of instructions, educational materials, and care plan provided today and declined offer to receive copy of patient instructions, educational materials, and care plan.   Follow up Plan: Patient would like continued follow-up.  CCM RNCM will outreach the patient within the next 8 weeks.  Patient will call office if needed prior to next encounter  [name], RN  [phone]
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
## 2021-01-01 DIAGNOSIS — T310 Burns involving less than 10% of body surface: Secondary | ICD-10-CM | POA: Diagnosis not present

## 2021-01-01 DIAGNOSIS — T2125XD Burn of second degree of buttock, subsequent encounter: Secondary | ICD-10-CM | POA: Diagnosis not present

## 2021-01-01 DIAGNOSIS — Z888 Allergy status to other drugs, medicaments and biological substances status: Secondary | ICD-10-CM | POA: Diagnosis not present

## 2021-01-01 DIAGNOSIS — T24201D Burn of second degree of unspecified site of right lower limb, except ankle and foot, subsequent encounter: Secondary | ICD-10-CM | POA: Diagnosis not present

## 2021-01-18 ENCOUNTER — Ambulatory Visit: Payer: Medicare Other | Admitting: Licensed Clinical Social Worker

## 2021-01-18 DIAGNOSIS — Z139 Encounter for screening, unspecified: Secondary | ICD-10-CM

## 2021-01-18 DIAGNOSIS — Z7189 Other specified counseling: Secondary | ICD-10-CM

## 2021-01-18 NOTE — Patient Instructions (Signed)
Visit Information  Goals Addressed            This Visit's Progress   . Additional Support in the Home   Not on track    Patient Goals/Self-Care Activities: . We will Call Johney Frame [phone] to discuss PACE program         and arrange a site visit for you when you are ready    . COMPLETED: Find Help in My Community       Timeframe:  Short-Term Goal Priority:  High Start Date:    11/17/20                         Expected End Date:  12/22/20                     . You will receive assistance with covering the co-pay for your powerchair from the [facility] Patient Assistance funds . Congratulations on getting your wheelchair     . COMPLETED: transportation       Patient Goals/Self-Care Activities:  . SCAT door to door has been approved      Patient verbalizes understanding of instructions provided today and agrees to view in [place].   The care management team will reach out to the patient again over the next 30 days.   [name], LCSW Care Management & Coordination  [phone]

## 2021-01-18 NOTE — Chronic Care Management (AMB) (Signed)
Care Management   Clinical Social Work Note  01/18/2021 Name: JAQUELIN MEANEY MRN: [id number] DOB: February 13, 1946  Kaliope MCCALL WILL is a 75 y.o. year old female who is a primary care patient of [name], MD. The CCM team was consulted to assist the patient with chronic disease management and/or care coordination needs related to: Transportation Needs  and Level of Care Concerns.   Engaged with patient by telephone for follow up visit in response to provider referral for social work chronic care management and care coordination services.   Consent to Services:  The patient was given information about Chronic Care Management services, agreed to services, and gave verbal consent prior to initiation of services.  Please see initial visit note for detailed documentation.   Patient agreed to services and consent obtained.   Assessment: Patient is making progress with getting transportation needs met , but continues to experience ambivalence as it relates to moving forward with PACE. Patient willing to explore PACE after her speciality appointments this month. See Care Plan below for interventions and patient self-care actives.  Follow up Plan: Patient would like continued follow-up.  CCM LCSW will follow up with patient over the next 30 days. Patient will call office if needed prior to next encounter.   : Review of patient past medical history, allergies, medications, and health status, including review of relevant consultants reports was performed today as part of a comprehensive evaluation and provision of chronic care management and care coordination services.     SDOH (Social Determinants of Health) assessments and interventions performed:    Advanced Directives Status: Not addressed in this encounter.  CCM Care Plan  Allergies  Allergen Reactions  . Feraheme [Ferumoxytol] Itching  . Rosiglitazone Maleate Other (See Comments)    REACTION: Difficulty walking, Fatigue, shortness of  breath    Outpatient Encounter Medications as of 01/18/2021  Medication Sig  . acetaminophen (TYLENOL) 325 MG tablet Take 2 tablets (650 mg total) by mouth every 6 (six) hours.  . Blood Glucose Monitoring Suppl (ONETOUCH VERIO) w/Device KIT Check sugar 3 times per day (Patient not taking: Reported on 06/16/2020)  . feeding supplement, ENSURE ENLIVE, (ENSURE ENLIVE) LIQD Take 237 mLs by mouth 3 (three) times daily between meals. (Patient not taking: Reported on 09/27/2020)  . ferrous sulfate 324 (65 Fe) MG TBEC TAKE 1 TABLET BY MOUTH DAILY WITH BREAKFAST  . furosemide (LASIX) 40 MG tablet TAKE 1 TABLET BY MOUTH EVERY DAY  . gabapentin (NEURONTIN) 100 MG capsule TAKE 1 CAPSULE (100 MG TOTAL) BY MOUTH EVERY 8 (EIGHT) HOURS.  . glucose blood (ONETOUCH VERIO) test strip Check blood sugar daily (Patient not taking: Reported on 06/16/2020)  . Incontinence Supply Disposable (INCONTINENCE BRIEF MEDIUM) MISC Wear as needed for incontinence  . Lancet Devices (MICROLET NEXT LANCING DEVICE) MISC 1 each by Does not apply route 3 (three) times daily. Check blood sugar 3 times daily (Patient not taking: Reported on 06/16/2020)  . levothyroxine (SYNTHROID) 150 MCG tablet Take 1 tablet (150 mcg total) by mouth daily.  Marland Kitchen lidocaine (LIDODERM) 5 % Apply one patch to each lower limbs for 12 hours a day as needed for phantom pain. Remove after 12 hours or as directed by MD  . Misc. Devices MISC Mepilex silver sulfate-foam bandage 6x6" change dressing weekly  . ONETOUCH DELICA LANCETS 08M MISC Check blood sugar once daily (Patient not taking: Reported on 06/16/2020)  . PROAIR HFA 108 (90 Base) MCG/ACT inhaler INHALE 2 PUFFS INTO THE  LUNGS EVERY 6 HOURS AS NEEDED FOR WHEEZING OR SHORTNESS OF BREATH  . rosuvastatin (CRESTOR) 20 MG tablet Take 1 tablet (20 mg total) by mouth daily.   No facility-administered encounter medications on file as of 01/18/2021.    Patient Active Problem List   Diagnosis Date Noted  . Bibasilar  crackles 11/28/2020  . Controlled type 2 diabetes mellitus with stable proliferative retinopathy of both eyes, with long-term current use of insulin ([place]) 08/07/2020  . Pseudophakia of both eyes 08/07/2020  . At high risk for pressure injury of skin 12/16/2019  . Uncontrolled type 2 diabetes mellitus with hyperglycemia ([place]) 11/12/2019  . S/P AKA (above knee amputation) bilateral ([place]) 11/12/2019  . Food insecurity 11/12/2019  . Wound infection   . Critical lower limb ischemia ([place])   . Moderate protein-calorie malnutrition ([place])   . Left breast mass 06/02/2019  . Stress 06/02/2019  . Peripheral vascular disease ([place]) 07/22/2018  . Dyspnea 03/23/2018  . Impaired functional mobility, balance, gait, and endurance 03/02/2018    Class: Diagnosis of  . Abnormality of gait 02/26/2018  . Iron deficiency anemia 03/19/2017  . Vision disturbance 03/20/2015  . Memory difficulty 03/20/2015  . At high risk for falls 11/10/2012  . DIABETIC  RETINOPATHY 03/17/2007  . Type 2 diabetes mellitus with diabetic neuropathy, unspecified ([place]) 03/17/2007  . Hypothyroidism 10/16/2006  . Type 2 diabetes mellitus ([place]) 10/16/2006  . OBESITY, NOS 10/16/2006  . HYPERTENSION, BENIGN SYSTEMIC 10/16/2006  . GASTROESOPHAGEAL REFLUX, NO ESOPHAGITIS 10/16/2006  . Rheumatoid arthritis ([place]) 10/16/2006  . Disorder of bone and cartilage 10/16/2006  . INCONTINENCE, URGE 10/16/2006    Conditions to be addressed/monitored: Transportation and Level of care concerns  Care Plan : Clinical Social Work  Updates made by Maurine Cane, LCSW since 01/18/2021 12:00 AM  Problem: Limited support in the home   Priority: High  Long-Range Goal: patient will explore options with the PACE program for additional support in the home   Start Date: 06/02/2020  Expected End Date: 08/18/2021  This Visit's Progress: Not on track  Recent Progress: On track  Priority: High  Assessment, progress & current barriers:  . Patient would  like to keep appointment with specialist before changing care to PACE . Willing to explore PACE after she is approved for SCAT door to door ( door to door approved) . Patient is open to look into PACE after she gets her electric wheelchair. ( has wheelchair) . Patient unable to consistently perform activities of daily living . needs additional assistance in the home in order to meet this unmet need . Wheelchair bound; Limited support at home Interventions : . Assessed needs, level of care concerns, progress and barriers . Discussed PACE program with patient, she isOther interventions provided:Motivational Interviewing . Patient would like to go visit PACE before making a decision, states monthly fee will be approx $400 per  . PACE intake worker is Johney Frame [phone]   . Provided patient with contact number to call Noemi to discuss PACE and benefits of participating in the program . Collaborated with CCM RN for ongoing need . Other interventions provided: Solution-Focused Strategies, Emotional/Supportive Counseling, and Problem Solving Patient Goals/Self-Care Activities: Over the next 30 to 45 days . We will Call Johney Frame [phone] to discuss PACE program         and arrange a site visit for you when you are ready.   Problem: Barriers to Treatment with Transporation   Goal: Get approved for SCAT door to  door service Completed 01/18/2021  Start Date: 12/27/2020  Recent Progress: On track  Priority: High  Current barriers:  Goal completed .  Transportation and ADL IADL limitations Clinical Goals: Patient will work with LCSW to address needs related to SCAT Clinical Interventions:  . Inter-disciplinary care team collaboration (see longitudinal plan of care) . Assessment of needs, barriers and progress  . Collaborated with SCAT for update on application, patient has been approved for door to door . Collaborated with PCP and SCAT for patient's SCAT application ( Part B of  application completed by PCP 12/27/2020) . Assisted patient with completing application received verbal approval via phone to assist with application . Part A & B of SCAT application faxed by LCSW 12/27/2020 . Other interventions provided:Solution-Focused Strategies and Problem Solving [place]  Patient Goals/Self-Care Activities: . SCAT door to door has been approved     [name], [place] / [place]   [phone] 3:07 PM

## 2021-01-22 ENCOUNTER — Other Ambulatory Visit: Payer: Self-pay

## 2021-01-22 ENCOUNTER — Ambulatory Visit (INDEPENDENT_AMBULATORY_CARE_PROVIDER_SITE_OTHER): Payer: Medicare Other | Admitting: Pulmonary Disease

## 2021-01-22 ENCOUNTER — Encounter: Payer: Self-pay | Admitting: Pulmonary Disease

## 2021-01-22 DIAGNOSIS — M05742 Rheumatoid arthritis with rheumatoid factor of left hand without organ or systems involvement: Secondary | ICD-10-CM | POA: Diagnosis not present

## 2021-01-22 DIAGNOSIS — M8008XA Age-related osteoporosis with current pathological fracture, vertebra(e), initial encounter for fracture: Secondary | ICD-10-CM | POA: Diagnosis not present

## 2021-01-22 DIAGNOSIS — M05741 Rheumatoid arthritis with rheumatoid factor of right hand without organ or systems involvement: Secondary | ICD-10-CM | POA: Diagnosis not present

## 2021-01-22 DIAGNOSIS — J849 Interstitial pulmonary disease, unspecified: Secondary | ICD-10-CM

## 2021-01-22 NOTE — Progress Notes (Signed)
Subjective:    Patient ID: [name], female    DOB: [birth year], 75 y.o.   MRN: [id number]  HPI 75 year old never smoker referred for evaluation of abnormal CT scan and bibasilar crackles Patient is a poor historian and not much aware of her medical issues  Reviewed PCP notes from 11/28/2020 She was seen by remote health NP and given Levaquin for 10 days in April  She underwent bilateral AKA in February 2021, was getting around in a wheelchair and just obtained electric scooter 2 weeks ago.  She denies shortness of breath or cough.  She denies any problems swallowing. Reviewed rheumatology evaluation from 07/2019/Dr. Deveshwar who intended to start on immunosuppressants but she was lost to follow-up.  She has seen Dr. Charlestine Night in her 75s , was on methotrexate and low-dose prednisone for some time  PMH -rheumatoid arthritis Bilateral AKA  Significant tests/ events reviewed  CT chest wo con 11/2020 >> Extensive patchy honeycombing and traction bronchiectasis at the left greater than right lung bases, suggestive of fibrotic interstitial lung disease with a UIP pattern - patulous debris-filled esophagus - Patchy consolidation in the dependent left greater than right lower lobes and lingula ? Aspiration pneumonia - nodular 2.2 cm focus of consolidation in the posterior left upper lobe - T4 and mild superior L1 vertebral compression fractures  Past Medical History:  Diagnosis Date  . Bilateral lower extremity edema 06/06/2014  . Cataracts, bilateral 02/13/2011   Seen on eye exam 02/07/11. F/u in 12 months   . Diabetes mellitus age 75  . Diabetic ulcer of toe ([place]) 12/12/2015  . Hyperlipidemia   . Hypertension   . Retinal detachment, old, partial    left  . Retinopathy due to secondary diabetes mellitus ([place])    L>R, laser 3/07  . Schizotypal personality disorder ([place])   . Thyroid disease   . Weight loss 01/22/2012   Past Surgical History:  Procedure Laterality Date  . ABDOMINAL  AORTOGRAM W/LOWER EXTREMITY N/A 07/23/2018   Procedure: ABDOMINAL AORTOGRAM W/LOWER EXTREMITY;  Surgeon: Marty Heck, MD;  Location: [facility];  Service: Cardiovascular;  Laterality: N/A;  . ABOVE KNEE LEG AMPUTATION Bilateral 09/27/2019  . AMPUTATION Bilateral 09/27/2019   Procedure: AMPUTATION ABOVE KNEE;  Surgeon: Angelia Mould, MD;  Location: [facility];  Service: Vascular;  Laterality: Bilateral;  . BREAST EXCISIONAL BIOPSY Bilateral   . BREAST SURGERY    . CATARACT EXTRACTION     left   . EYE SURGERY    . ROTATOR CUFF REPAIR  1990's   left  . Thyroid radiation ablation     for Graves Disease  . TRANSTHORACIC ECHOCARDIOGRAM      EF55-65%, nml - 12/14/2004    Allergies  Allergen Reactions  . Feraheme [Ferumoxytol] Itching  . Rosiglitazone Maleate Other (See Comments)    REACTION: Difficulty walking, Fatigue, shortness of breath    Social History   Socioeconomic History  . Marital status: Widowed    Spouse name: Not on file  . Number of children: 3  . Years of education: 58  . Highest education level: Not on file  Occupational History  . Occupation: RetiredSoftware engineer   Tobacco Use  . Smoking status: Never Smoker  . Smokeless tobacco: Never Used  Vaping Use  . Vaping Use: Never used  Substance and Sexual Activity  . Alcohol use: No  . Drug use: No  . Sexual activity: Not Currently  Other Topics Concern  . Not on  file  Social History Narrative      Current Social History  06/16/2020   Who lives at home: Lives alone in one level home; grandson rotate and stay with her    Transportation: taxi. [employer] for medical appointments. 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 Heart disease Mother   . Hypertension Mother   . Heart attack Mother   . Colon cancer Brother   . Colon cancer Maternal Grandmother   . Pulmonary fibrosis Father   . Diabetes Daughter   . Liver disease Sister        transplant, liver  . Liver cancer Brother   . Breast cancer Niece     Review of Systems Complaints of joint swelling in both hands, no pain  Constitutional: negative for anorexia, fevers and sweats  Eyes: negative for irritation, redness and visual disturbance  Ears, nose, mouth, throat, and face: negative for earaches, epistaxis, nasal congestion and sore throat  Respiratory: negative for cough, dyspnea on exertion, sputum and wheezing  Cardiovascular: negative for chest pain, dyspnea, lower extremity edema, orthopnea, palpitations and syncope  Gastrointestinal: negative for abdominal pain, constipation, diarrhea, melena, nausea and vomiting  Genitourinary:negative for dysuria, frequency and hematuria  Hematologic/lymphatic: negative for bleeding, easy bruising and lymphadenopathy  Musculoskeletal:negative for arthralgias, muscle weakness  Neurological: negative for coordination problems, gait problems, headaches and weakness  Endocrine: negative for diabetic symptoms including polydipsia, polyuria and weight loss     Objective:   Physical Exam  Gen. Pleasant, elderly, thinwoman,, in no distress, normal affect , motorized wheelchair ENT - no pallor,icterus, no  post nasal drip Neck: No JVD, no thyromegaly, no carotid bruits Lungs: no use of accessory muscles, no dullness to percussion, bibasal dry rales , mild kyphosis Cardiovascular: Rhythm regular, heart sounds  normal, no murmurs or gallops, no peripheral edema Abdomen: soft and non-tender, no hepatosplenomegaly, BS normal. Musculoskeletal: BL AKA, no cyanosis or clubbing Neuro:  alert, non focal       Assessment & Plan:

## 2021-01-22 NOTE — Assessment & Plan Note (Addendum)
Has been on methotrexate in the past.  Seen by Dr. Estanislado Pandy, found to have erosive RA, we will arrange for rheumatology follow-up.  She does seem to have active disease and would likely need immunosuppressive therapy given lung involvement Due to patulous esophagus noted on CT, question of scleroderma has been raised but she does not seem to have any skin involvement on questioning, again await repeat rheumatology evaluation

## 2021-01-22 NOTE — Assessment & Plan Note (Signed)
CT chest shows UIP pattern .  This would be consistent with rheumatoid lung, other differential includes IPF but we would not make that diagnosis in someone with rheumatoid arthritis.  We will schedule PFTs to establish baseline. We will follow-up with high-resolution CT of the chest in 3 months which would also enable [place] to follow-up on the consolidation noted on previous CT -she does not have any symptoms of active pneumonia.  I think the reason she has been asymptomatic so far has because of her limited mobility and using her motorized wheelchair.  I do expect to find significant restriction on PFTs

## 2021-01-22 NOTE — Patient Instructions (Signed)
Get back with rheumatologist - dr deveshwar  Schedule PFTs HRCT chest in end July

## 2021-01-22 NOTE — Assessment & Plan Note (Signed)
Noted on CT.  Defer to rheumatology for treatment of osteoporosis. Not a candidate for steroids long-term due to diabetes and osteoporosis

## 2021-02-05 ENCOUNTER — Ambulatory Visit (INDEPENDENT_AMBULATORY_CARE_PROVIDER_SITE_OTHER): Payer: Medicare Other | Admitting: Ophthalmology

## 2021-02-05 DIAGNOSIS — T310 Burns involving less than 10% of body surface: Secondary | ICD-10-CM | POA: Diagnosis not present

## 2021-02-05 DIAGNOSIS — T2125XD Burn of second degree of buttock, subsequent encounter: Secondary | ICD-10-CM | POA: Diagnosis not present

## 2021-02-05 DIAGNOSIS — T24201D Burn of second degree of unspecified site of right lower limb, except ankle and foot, subsequent encounter: Secondary | ICD-10-CM | POA: Diagnosis not present

## 2021-02-15 ENCOUNTER — Ambulatory Visit: Payer: Medicare Other | Admitting: Licensed Clinical Social Worker

## 2021-02-15 ENCOUNTER — Telehealth: Payer: Medicare Other

## 2021-02-15 DIAGNOSIS — Z741 Need for assistance with personal care: Secondary | ICD-10-CM

## 2021-02-15 DIAGNOSIS — Z7189 Other specified counseling: Secondary | ICD-10-CM

## 2021-02-15 NOTE — Patient Instructions (Signed)
Visit Information   Goals Addressed             This Visit's Progress    Additional Support in the Home   On track    Patient Goals/Self-Care Activities: I will fax referral to intake work at [facility]  They will contact you to discuss site visit      Patient verbalizes understanding of instructions provided today and agrees to view in [place].   Telephone follow up appointment with care management team member scheduled for: Aug 2022  [name], [place] Management & Coordination  [phone]

## 2021-02-15 NOTE — Chronic Care Management (AMB) (Signed)
Care Management   Clinical Social Work Note  02/15/2021 Name: [name] MRN: [id number] DOB: 26-Sep-1945  [name] is a 75 y.o. year old female who is a primary care patient of Kari Mems Delorse Limber, MD. The CCM team was consulted to assist the patient with chronic disease management and/or care coordination needs related to: Level of Care Concerns.   Engaged with patient by telephone for follow up visit in response to provider referral for social work chronic care management and care coordination services.   Consent to Services: Patient agreed to services and consent obtained.   Assessment: F/U phone appointment scheduled with CCM RN today to assess needs, and progress with care plan goals.  CCM LCSW providing coverage.  Patient reports no immediate needs or concerns for CCM RN today.  Reminded patient of all upcoming appointments.   Patient is engaged in conversation, continues to maintain positive progress with care plan goals.She is ready to move forward with exploring the PACE program. States she needs to start getting out of the house more.  See Care Plan below for interventions and patient self-care actives.  Follow up Plan: Patient would like continued follow-up from [name] and CCM LCSW .  Follow up scheduled in Aug for CCM RN. Patient will call office if needed prior to next encounter.  CM LCSW will fax referral to [name] per request from [name].    Review of patient past medical history, allergies, medications, and health status, including review of relevant consultants reports was performed today as part of a comprehensive evaluation and provision of chronic care management and care coordination services.     SDOH (Social Determinants of Health) assessments and interventions performed:    Advanced Directives Status: Not addressed in this encounter.  CCM Care Plan  Allergies  Allergen Reactions   Feraheme [Ferumoxytol] Itching    Rosiglitazone Maleate Other (See Comments)    REACTION: Difficulty walking, Fatigue, shortness of breath    Outpatient Encounter Medications as of 02/15/2021  Medication Sig   acetaminophen (TYLENOL) 325 MG tablet Take 2 tablets (650 mg total) by mouth every 6 (six) hours.   ferrous sulfate 324 (65 Fe) MG TBEC TAKE 1 TABLET BY MOUTH DAILY WITH BREAKFAST   furosemide (LASIX) 40 MG tablet TAKE 1 TABLET BY MOUTH EVERY DAY   gabapentin (NEURONTIN) 100 MG capsule TAKE 1 CAPSULE (100 MG TOTAL) BY MOUTH EVERY 8 (EIGHT) HOURS.   Incontinence Supply Disposable (INCONTINENCE BRIEF MEDIUM) MISC Wear as needed for incontinence   levothyroxine (SYNTHROID) 150 MCG tablet Take 1 tablet (150 mcg total) by mouth daily.   lidocaine (LIDODERM) 5 % Apply one patch to each lower limbs for 12 hours a day as needed for phantom pain. Remove after 12 hours or as directed by MD   Misc. Devices MISC Mepilex silver sulfate-foam bandage 6x6" change dressing weekly   PROAIR HFA 108 (90 Base) MCG/ACT inhaler INHALE 2 PUFFS INTO THE LUNGS EVERY 6 HOURS AS NEEDED FOR WHEEZING OR SHORTNESS OF BREATH   rosuvastatin (CRESTOR) 20 MG tablet Take 1 tablet (20 mg total) by mouth daily.   No facility-administered encounter medications on file as of 02/15/2021.    Patient Active Problem List   Diagnosis Date Noted   Vertebral fracture, osteoporotic ([place]) 01/22/2021   ILD (interstitial lung disease) ([place]) 11/28/2020   Controlled type 2 diabetes mellitus with stable proliferative retinopathy of both eyes, with long-term current use of insulin ([place]) 08/07/2020  Pseudophakia of both eyes 08/07/2020   At high risk for pressure injury of skin 12/16/2019   Uncontrolled type 2 diabetes mellitus with hyperglycemia ([place]) 11/12/2019   S/P AKA (above knee amputation) bilateral ([place]) 11/12/2019   Food insecurity 11/12/2019   Wound infection    Critical lower limb ischemia (HCC)    Moderate protein-calorie malnutrition (HCC)    Left breast  mass 06/02/2019   Stress 06/02/2019   Peripheral vascular disease ([place]) 07/22/2018   Dyspnea 03/23/2018   Impaired functional mobility, balance, gait, and endurance 03/02/2018    Class: Diagnosis of   Abnormality of gait 02/26/2018   Iron deficiency anemia 03/19/2017   Vision disturbance 03/20/2015   Memory difficulty 03/20/2015   At high risk for falls 11/10/2012   DIABETIC  RETINOPATHY 03/17/2007   Type 2 diabetes mellitus with diabetic neuropathy, unspecified ([place]) 03/17/2007   Hypothyroidism 10/16/2006   Type 2 diabetes mellitus ([place]) 10/16/2006   OBESITY, NOS 10/16/2006   HYPERTENSION, BENIGN SYSTEMIC 10/16/2006   GASTROESOPHAGEAL REFLUX, NO ESOPHAGITIS 10/16/2006   Rheumatoid arthritis ([place]) 10/16/2006   Disorder of bone and cartilage 10/16/2006   INCONTINENCE, URGE 10/16/2006    Conditions to be addressed/monitored:  Level of care concerns  Care Plan : Clinical Social Work  Updates made by Maurine Cane, LCSW since 02/15/2021 12:00 AM   Problem: Limited support in the home   Priority: High   Long-Range Goal: patient will explore options with the PACE program for additional support in the home   Start Date: 06/02/2020  Expected End Date: 08/18/2021  This Visit's Progress: On track  Recent Progress: Not on track  Priority: High  Assessment, progress & current barriers:  Willing to explore PACE she is approved for SCAT door to door  Patient has her electric wheelchair now willing to explore PACE  Patient unable to consistently perform activities of daily living needs additional assistance in the home in order to meet this unmet need Wheelchair bound; Limited support at home Interventions : Assessed needs, level of care concerns, progress and barriers Discussed PACE program with patient, she would like to go visit PACE before making a decision, states monthly fee will be approx $400 per  LCSW called [facility] intake worker [phone]  left voice message to contact LCSW  or [name] to set up site visit Received return call from [facility]; requested formal referral for [name]: will fax referral [phone] Phone appointment rescheduled with CCM RN for ongoing need Other interventions provided: Solution-Focused Strategies, Emotional/Supportive Counseling, and Problem Solving Patient Goals/Self-Care Activities: Over the next 30 days We will call intake work at [facility] to discuss site visit         [name], [place] / [place]   [phone] 1:58 PM

## 2021-02-27 ENCOUNTER — Other Ambulatory Visit: Payer: Self-pay

## 2021-02-27 NOTE — Patient Outreach (Signed)
Aging Gracefully Program  02/27/2021  [name] 1946/05/04 [id number]  [facility] Evaluation Interviewer attempted to call patient on today regarding Aging Gracefully referral. No answer from patient after multiple rings. CMA left confidential voicemail for patient to return call.  Will attempt to call back within 1 week.   [place] Management Assistant [phone]

## 2021-03-01 ENCOUNTER — Ambulatory Visit: Payer: Self-pay | Admitting: Licensed Clinical Social Worker

## 2021-03-01 DIAGNOSIS — Z789 Other specified health status: Secondary | ICD-10-CM

## 2021-03-01 NOTE — Chronic Care Management (AMB) (Signed)
  Care Management  Collaboration  Note  03/01/2021 Name: [name] MRN: [id number] DOB: [birth year]  [place] [name] is a 75 y.o. year old female who is a primary care patient of [name], Kari Limber, MD. The CCM team was consulted reference care coordination needs for Level of Care Concerns and connect with PACE program  Assessment: Patient was not interviewed or contacted during this encounter. See Care Plan or interventions for patient self-care actives.   Intervention:Conducted brief assessment, recommendations and relevant information discussed. CCM LCSW collaborated with PACE program ref progress to get site visit scheduled for patient to visit PACE center .    Follow up Plan: No follow up scheduled with CCM team at this time. Will follow up with patient in 2 weeks as needed or continue to collaborate with PACE team .   Review of patient past medical history, allergies, medications, and health status, including review of pertinent consultant reports was performed as part of comprehensive evaluation and provision of care management/care coordination services.   Care Plan Conditions to be addressed/monitored per PCP order:  Level of care concerns   Care Plan : Clinical Social Work  Updates made by Kari Cane, LCSW since 03/01/2021 12:00 AM   Problem: Limited support in the home   Priority: High   Long-Range Goal: patient will explore options with the PACE program for additional support in the home   Start Date: 06/02/2020  Expected End Date: 08/18/2021  This Visit's Progress: On track  Recent Progress: On track  Priority: High  Assessment, progress & current barriers:  Willing to explore PACE she is approved for SC[facility] door to door  Patient has her electric wheelchair now willing to explore PACE  Patient unable to consistently perform activities of daily living needs additional assistance in the home in order to meet this unmet need Wheelchair bound; Limited support at  home Interventions : Assessed needs, level of care concerns, progress and barriers Discussed PACE program with patient, she would like to go visit PACE before making a decision, states monthly fee will be approx $400 per  LCSW called [facility]ACE intake worker [phone]  left voice message to contact LCSW or [name] to set up site visit Received return call from [facility]; requested formal referral for [name]: will fax referral [phone] Phone appointment rescheduled with CCM RN for ongoing need Other interventions provided: Solution-Focused Strategies, Emotional/Supportive Counseling, and Problem Solving Patient Goals/Self-Care Activities: Over the next 30 days I have fax referral to [facility]  They will contact you to discuss a site visit   [name], [place] / [place]   [phone] 11:14 AM

## 2021-03-01 NOTE — Patient Instructions (Signed)
Visit Information   Goals Addressed             This Visit's Progress    tour PACE program       Patient Goals/Self-Care Activities: I have fax referral to [facility]ACE [phone]  They will contact you to discuss a site visit       I will follow up with you in 1 to 2 weeks and continue to collaborate with the PACE team  [name], [place]

## 2021-03-05 ENCOUNTER — Encounter (INDEPENDENT_AMBULATORY_CARE_PROVIDER_SITE_OTHER): Payer: Medicare Other | Admitting: Ophthalmology

## 2021-03-06 ENCOUNTER — Ambulatory Visit
Admission: RE | Admit: 2021-03-06 | Discharge: 2021-03-06 | Disposition: A | Discharge status: Home / Self Care | Payer: Medicare Other | Source: Ambulatory Visit | Attending: Pulmonary Disease | Admitting: Pulmonary Disease

## 2021-03-06 DIAGNOSIS — J849 Interstitial pulmonary disease, unspecified: Secondary | ICD-10-CM

## 2021-03-06 DIAGNOSIS — J479 Bronchiectasis, uncomplicated: Secondary | ICD-10-CM | POA: Diagnosis not present

## 2021-03-06 DIAGNOSIS — I700 Atherosclerosis of aorta: Secondary | ICD-10-CM | POA: Diagnosis not present

## 2021-03-07 ENCOUNTER — Ambulatory Visit: Payer: Self-pay | Admitting: Licensed Clinical Social Worker

## 2021-03-07 DIAGNOSIS — Z7689 Persons encountering health services in other specified circumstances: Secondary | ICD-10-CM

## 2021-03-07 NOTE — Chronic Care Management (AMB) (Signed)
  Care Management  Collaboration  Note  03/07/2021 Name: [name] MRN: [id number] DOB: 05-04-1946  [name] is a 75 y.o. year old female who is a primary care patient of [name], Kari Limber, MD. The CCM team was consulted reference care coordination needs for Level of Care Concerns.  Assessment: Patient was not interviewed or contacted during this encounter. See Care Plan or interventions for patient self-care actives.   Intervention:Conducted brief assessment, recommendations and relevant information discussed. CCM LCSW collaborated with CCM RN and PACE Team.  PACE has contacted patient, she has requested they follow up with her in Aug. To schedule a home visit and the agency site visit.  Follow up Plan: follow up phone appointment scheduled with CCM RN  in Aug. LCSW will follow up with patient after visit with PACE .   Review of patient past medical history, allergies, medications, and health status, including review of pertinent consultant reports was performed as part of comprehensive evaluation and provision of care management/care coordination services.   Care Plan Conditions to be addressed/monitored per PCP order:  Level of care concerns   Care Plan : Clinical Social Work  Updates made by Kari Cane, LCSW since 03/07/2021 12:00 AM   Problem: Limited support in the home   Priority: High   Long-Range Goal: patient will explore options with the PACE program for additional support in the home   Start Date: 06/02/2020  Expected End Date: 08/18/2021  This Visit's Progress: On track  Recent Progress: On track  Priority: High  Assessment, progress & current barriers:  Willing to explore PACE she is approved for SCAT door to door  Patient has her electric wheelchair now willing to explore PACE  Patient unable to consistently perform activities of daily living needs additional assistance in the home in order to meet this unmet need Wheelchair bound; Limited support at  home Interventions : Assessed needs, level of care concerns, progress and barriers Collaborated with PACE team Discussed PACE program with patient, she would like to go visit PACE before making a decision, states monthly fee will be approx $400 per  LCSW called [facility]ACE intake worker [phone]  left voice message to contact LCSW or [name] to set up site visit Received return call from [facility]; requested formal referral for [name]: will fax referral [phone] Phone appointment rescheduled with CCM RN for ongoing need Other interventions provided: Solution-Focused Strategies, Emotional/Supportive Counseling, and Problem Solving Patient Goals/Self-Care Activities: Over the next 30 days [facility]ACE will contact you to discuss a site visit    [name], [place] / [place]   [phone] 12:32 PM

## 2021-03-07 NOTE — Patient Instructions (Signed)
Visit Information   Goals Addressed             This Visit's Progress    tour PACE program   On track    Patient Goals/Self-Care Activities: [facility]ACE will contact you to discuss a site visit       Patient verbalizes understanding of instructions provided today and agrees to view in [place].    F/u scheduled with CCM RN, LCSW will continue to collaborate with CCM RN and PACE team.  [name], [place] Management & Coordination  [phone]

## 2021-03-08 ENCOUNTER — Other Ambulatory Visit: Payer: Self-pay

## 2021-03-08 NOTE — Patient Outreach (Signed)
Aging Gracefully Program  03/08/2021  [name] Jan 10, 1946 [id number]  [facility] Evaluation Interviewer made contact with patient. Aging Gracefully final survey completed.   Interviewer will send referral to Sharol Given from [facility] for follow up.  [place] Management Assistant  [phone]

## 2021-03-19 ENCOUNTER — Ambulatory Visit: Payer: Medicare Other
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
## 2021-03-19 NOTE — Patient Instructions (Signed)
Visit Information  Ms. Gitto  it was nice speaking with you. Please call me directly [phone] if you have questions about the goals we discussed.   Goals Addressed               This Visit's Progress     " I am confused with having my legs amputatated and trying to cope" (pt-stated)         Timeframe:  Long-Range Goal Priority:  Medium Start Date:       11/13/19                      Expected End Date: 05/18/21       Patient Goals/Self Care Activities:  Patient verbalizes understanding of plan  Attends all scheduled provider appointments Calls provider office for new concerns or questions tell my story and reason for my visit ask questions repeat what I heard to make sure I understand speak up when I don't understand      COMPLETED: Take care of my wounds (pt-stated)        Timeframe:  Short-Term Goal Priority:  High Start Date:       12/13/20                      Expected End Date:    03/16/21                Patient Goals/Self care activities: Patient will self administer medications as prescribed Patient will attend all scheduled provider appointments Patient will call pharmacy for medication refills Patient will call provider office for new concerns or questions Patient will follow instructions for wound care Patient will call [facility] for any Questions         The patient verbalized understanding of instructions, educational materials, and care plan provided today and declined offer to receive copy of patient instructions, educational materials, and care plan.   Follow up Plan: Patient would like continued follow-up.  CCM RNCM will outreach the patient within the next 4 weeks  Patient will call office if needed prior to next encounter  [name], RN  [phone]

## 2021-03-21 ENCOUNTER — Ambulatory Visit: Payer: Self-pay | Admitting: Licensed Clinical Social Worker

## 2021-03-21 DIAGNOSIS — Z7189 Other specified counseling: Secondary | ICD-10-CM

## 2021-03-21 NOTE — Patient Instructions (Signed)
Visit Information   Goals Addressed             This Visit's Progress    tour [facility] program   On track    Patient Goals/Self-Care Activities: [facility] will contact you to discuss a site visit      I will follow up with you after your visit to [place], [place] Management & Coordination  [phone]

## 2021-03-21 NOTE — Chronic Care Management (AMB) (Signed)
  Care Management  Collaboration  Note  03/21/2021 Name: SHAKIRA LOS MRN: [id number] DOB: [birth year]  [place] MAGNOLIA MATTILA is a 75 y.o. year old female who is a primary care patient of [name], Delorse Limber, MD. The CCM team was consulted reference care coordination needs for Level of Care Concerns and [facility] program.  Assessment: Patient was not interviewed or contacted during this encounter. See Care Plan or interventions for patient self-care actives.   Intervention:Conducted brief assessment, recommendations and relevant information discussed. CCM LCSW collaborated with CCM RN and [facility] representative .    Follow up Plan: No follow up scheduled with CCM team at this time. Will follow up with patient in 2 to 3 weeks or after site visit at [facility] .   Review of patient past medical history, allergies, medications, and health status, including review of pertinent consultant reports was performed as part of comprehensive evaluation and provision of care management/care coordination services.   Care Plan Conditions to be addressed/monitored per PCP order:  Level of care concerns   Care Plan : Clinical Social Work  Updates made by Maurine Cane, LCSW since 03/21/2021 12:00 AM     Problem: Limited support in the home   Priority: High     Long-Range Goal: patient will explore options with the [facility]ACE program for additional support in the home   Start Date: 06/02/2020  Expected End Date: 08/18/2021  This Visit's Progress: On track  Recent Progress: On track  Priority: High  Note:   Assessment, progress & current barriers:  Patient not ready to move forward until Aug. 2022 She would like to go visit [facility] before making a decision, states monthly fee will be approx $400 per  Patient unable to consistently perform activities of daily living needs additional assistance in the home in order to meet this unmet need Wheelchair bound; Limited support at home Interventions : Assessed needs, level of care  concerns, progress and barriers Collaborated with CCM RN for ongoing need: Per patient she is now ready to schedule site visit to [place] with [facility] team; per [place] she will contact patient this week to schedule site visit LCSW called [facility]ACE intake worker [phone]  left voice message to contact LCSW or Ms Galgano to set up site visit Received return call from [facility]; requested formal referral for Ms Hoch: referral faxed [phone] Other interventions provided: Solution-Focused Strategies, Emotional/Supportive Counseling, and Problem Solving Patient Goals/Self-Care Activities: Over the next 30 days [facility]ACE will contact you to discuss a site visit    [name], [place] / [place]   [phone] 2:02 PM

## 2021-03-23 ENCOUNTER — Ambulatory Visit: Payer: Medicare Other | Admitting: Family Medicine

## 2021-03-28 ENCOUNTER — Ambulatory Visit: Payer: Self-pay | Admitting: Licensed Clinical Social Worker

## 2021-03-28 DIAGNOSIS — Z7189 Other specified counseling: Secondary | ICD-10-CM

## 2021-03-28 NOTE — Chronic Care Management (AMB) (Signed)
Care Management Clinical Social Work Note  03/28/2021 Name: CYNTHIE GARMON MRN: [id number] DOB: 1946/02/06  Chontel LUCELIA LACEY is a 75 y.o. year old female who is a primary care patient of [name], MD.  The Care Management team was consulted for assistance with chronic disease management and coordination needs.  Consent to Services:  The patient was given information about Care Management services, agreed to services, and gave verbal consent prior to initiation of services.  Please see initial visit note for detailed documentation.   Patient agreed to services today and consent obtained.   Assessment: Engaged with patient by phone in response to provider referral for social work care coordination services: Level of Care Concerns.   Patient is making progress with [facility]. She and grandson [name] met with program rep [place].  See Care Plan below for interventions and patient self-care actives.  Recommendation: Patient may benefit from, and is in agreement ask grandson to assist with getting bank statement for PACE application .   Follow up Plan: No follow up scheduled with CCM LCSW at this time. Will follow up with patient in 1 week .Patient will call office if needed prior to next encounter.    : Review of patient past medical history, allergies, medications, and health status, including review of relevant consultants reports was performed today as part of a comprehensive evaluation and provision of chronic care management and care coordination services.  SDOH (Social Determinants of Health) assessments and interventions performed:    Advanced Directives Status: Not addressed in this encounter.  Care Plan  Allergies  Allergen Reactions   Feraheme [Ferumoxytol] Itching   Rosiglitazone Maleate Other (See Comments)    REACTION: Difficulty walking, Fatigue, shortness of breath    Outpatient Encounter Medications as of 03/28/2021  Medication Sig   acetaminophen (TYLENOL) 325 MG  tablet Take 2 tablets (650 mg total) by mouth every 6 (six) hours.   ferrous sulfate 324 (65 Fe) MG TBEC TAKE 1 TABLET BY MOUTH DAILY WITH BREAKFAST   furosemide (LASIX) 40 MG tablet TAKE 1 TABLET BY MOUTH EVERY DAY   gabapentin (NEURONTIN) 100 MG capsule TAKE 1 CAPSULE (100 MG TOTAL) BY MOUTH EVERY 8 (EIGHT) HOURS.   Incontinence Supply Disposable (INCONTINENCE BRIEF MEDIUM) MISC Wear as needed for incontinence   levothyroxine (SYNTHROID) 150 MCG tablet Take 1 tablet (150 mcg total) by mouth daily.   lidocaine (LIDODERM) 5 % Apply one patch to each lower limbs for 12 hours a day as needed for phantom pain. Remove after 12 hours or as directed by MD   Misc. Devices MISC Mepilex silver sulfate-foam bandage 6x6" change dressing weekly   PROAIR HFA 108 (90 Base) MCG/ACT inhaler INHALE 2 PUFFS INTO THE LUNGS EVERY 6 HOURS AS NEEDED FOR WHEEZING OR SHORTNESS OF BREATH   rosuvastatin (CRESTOR) 20 MG tablet Take 1 tablet (20 mg total) by mouth daily.   No facility-administered encounter medications on file as of 03/28/2021.    Patient Active Problem List   Diagnosis Date Noted   Vertebral fracture, osteoporotic ([place]) 01/22/2021   ILD (interstitial lung disease) ([place]) 11/28/2020   Controlled type 2 diabetes mellitus with stable proliferative retinopathy of both eyes, with long-term current use of insulin ([place]) 08/07/2020   Pseudophakia of both eyes 08/07/2020   At high risk for pressure injury of skin 12/16/2019   Uncontrolled type 2 diabetes mellitus with hyperglycemia ([place]) 11/12/2019   S/P AKA (above knee amputation) bilateral ([place]) 11/12/2019   Food insecurity 11/12/2019  Wound infection    Critical lower limb ischemia (HCC)    Moderate protein-calorie malnutrition (HCC)    Left breast mass 06/02/2019   Stress 06/02/2019   Peripheral vascular disease ([place]) 07/22/2018   Dyspnea 03/23/2018   Impaired functional mobility, balance, gait, and endurance 03/02/2018    Class: Diagnosis of    Abnormality of gait 02/26/2018   Iron deficiency anemia 03/19/2017   Vision disturbance 03/20/2015   Memory difficulty 03/20/2015   At high risk for falls 11/10/2012   DIABETIC  RETINOPATHY 03/17/2007   Type 2 diabetes mellitus with diabetic neuropathy, unspecified ([place]) 03/17/2007   Hypothyroidism 10/16/2006   Type 2 diabetes mellitus ([place]) 10/16/2006   OBESITY, NOS 10/16/2006   HYPERTENSION, BENIGN SYSTEMIC 10/16/2006   GASTROESOPHAGEAL REFLUX, NO ESOPHAGITIS 10/16/2006   Rheumatoid arthritis ([place]) 10/16/2006   Disorder of bone and cartilage 10/16/2006   INCONTINENCE, URGE 10/16/2006    Conditions to be addressed/monitored:  Level of care concerns  Care Plan : Clinical Social Work  Updates made by Maurine Cane, LCSW since 03/28/2021 12:00 AM     Problem: Limited support in the home   Priority: High     Long-Range Goal: patient will explore options with the [facility]ACE program for additional support in the home   Start Date: 06/02/2020  Expected End Date: 08/18/2021  This Visit's Progress: On track  Recent Progress: On track  Priority: High  Note:   Assessment, progress & current barriers:  She would like to go visit [facility] before making a decision, states monthly fee will be approx $400 per  Patient unable to consistently perform activities of daily living needs additional assistance in the home in order to meet this unmet need Wheelchair bound; Limited support at home Interventions : Assessed needs, level of care concerns, progress and barriers Collaborated with CCM RN for ongoing need:  Collaborated with PACE rep.  [place] [phone]; She net with patient and grandson to explain PACE program They will think about it and get back with her ( they need to provide 3 months bank statements and copy of life insurance policy) Other interventions provided: Solution-Focused Strategies, Emotional/Supportive Counseling, and Problem Solving Patient Goals/Self-Care Activities: Over the  next 30 days [facility] will need 3 months bank statements before starting your application  Please talk with your grandson to help with getting the information      [name], [place] / [place]   [phone] 4:29 PM

## 2021-03-28 NOTE — Patient Instructions (Signed)
Visit Information   Goals Addressed             This Visit's Progress    tour [facility] program   On track    Patient Goals/Self-Care Activities: [facility] will need 3 months bank statements before starting your application  Please talk with your grandson to help with getting the information        Patient verbalizes understanding of instructions provided today.   Will follow up with you in one week.  Please call the office prior to our next encounter if needs arise   [name], [place] Management & Coordination  [phone]

## 2021-04-04 ENCOUNTER — Ambulatory Visit: Payer: Medicare Other | Admitting: Licensed Clinical Social Worker

## 2021-04-04 ENCOUNTER — Other Ambulatory Visit: Payer: Self-pay | Admitting: Family Medicine

## 2021-04-04 DIAGNOSIS — Z719 Counseling, unspecified: Secondary | ICD-10-CM

## 2021-04-04 DIAGNOSIS — Z7189 Other specified counseling: Secondary | ICD-10-CM

## 2021-04-04 NOTE — Chronic Care Management (AMB) (Signed)
Care Management Clinical Social Work Note  04/04/2021 Name: CALENA SALEM MRN: [id number] DOB: November 27, 1945  Genette EYVONNE BURCHFIELD is a 75 y.o. year old female who is a primary care patient of [name], MD.  The Care Management team was consulted for assistance with coordination needs.  Consent to Services:  The patient was given information about Care Management services, agreed to services, and gave verbal consent prior to initiation of services.  Please see initial visit note for detailed documentation.   Patient agreed to services and consent obtained.   Assessment: Engaged with patient's grandson Jimmie Molly by phone in response to provider referral for social work care coordination services: Level of Care Concerns.    Patient continues to experience difficulty with getting information needed for [facility] application.  She called the office provided phone number and requested LCSW contact grandson for collaboration .Marland Kitchen See Care Plan below for interventions and patient self-care actives.  Follow up Plan: Patient would like continued follow-up from CCM LCSW .  per patient's request will follow up in grandson in one week to check on progress with getting statements.  Will call office if needed prior to next encounter.   Review of patient past medical history, allergies, medications, and health status, including review of relevant consultants reports was performed today as part of a comprehensive evaluation and provision of chronic care management and care coordination services.  SDOH (Social Determinants of Health) assessments and interventions performed:    Advanced Directives Status: Not addressed in this encounter.  Care Plan  Allergies  Allergen Reactions   Feraheme [Ferumoxytol] Itching   Rosiglitazone Maleate Other (See Comments)    REACTION: Difficulty walking, Fatigue, shortness of breath    Outpatient Encounter Medications as of 04/04/2021  Medication Sig   acetaminophen  (TYLENOL) 325 MG tablet Take 2 tablets (650 mg total) by mouth every 6 (six) hours.   ferrous sulfate 324 (65 Fe) MG TBEC TAKE 1 TABLET BY MOUTH DAILY WITH BREAKFAST   furosemide (LASIX) 40 MG tablet TAKE 1 TABLET BY MOUTH EVERY DAY   gabapentin (NEURONTIN) 100 MG capsule TAKE 1 CAPSULE (100 MG TOTAL) BY MOUTH EVERY 8 (EIGHT) HOURS.   Incontinence Supply Disposable (INCONTINENCE BRIEF MEDIUM) MISC Wear as needed for incontinence   levothyroxine (SYNTHROID) 150 MCG tablet TAKE 1 TABLET BY MOUTH EVERY DAY   lidocaine (LIDODERM) 5 % Apply one patch to each lower limbs for 12 hours a day as needed for phantom pain. Remove after 12 hours or as directed by MD   Misc. Devices MISC Mepilex silver sulfate-foam bandage 6x6" change dressing weekly   PROAIR HFA 108 (90 Base) MCG/ACT inhaler INHALE 2 PUFFS INTO THE LUNGS EVERY 6 HOURS AS NEEDED FOR WHEEZING OR SHORTNESS OF BREATH   rosuvastatin (CRESTOR) 20 MG tablet Take 1 tablet (20 mg total) by mouth daily.   No facility-administered encounter medications on file as of 04/04/2021.    Patient Active Problem List   Diagnosis Date Noted   Vertebral fracture, osteoporotic ([place]) 01/22/2021   ILD (interstitial lung disease) ([place]) 11/28/2020   Controlled type 2 diabetes mellitus with stable proliferative retinopathy of both eyes, with long-term current use of insulin ([place]) 08/07/2020   Pseudophakia of both eyes 08/07/2020   At high risk for pressure injury of skin 12/16/2019   Uncontrolled type 2 diabetes mellitus with hyperglycemia ([place]) 11/12/2019   S/P AKA (above knee amputation) bilateral ([place]) 11/12/2019   Food insecurity 11/12/2019   Wound infection    Critical  lower limb ischemia (HCC)    Moderate protein-calorie malnutrition ([place])    Left breast mass 06/02/2019   Stress 06/02/2019   Peripheral vascular disease ([place]) 07/22/2018   Dyspnea 03/23/2018   Impaired functional mobility, balance, gait, and endurance 03/02/2018    Class: Diagnosis of    Abnormality of gait 02/26/2018   Iron deficiency anemia 03/19/2017   Vision disturbance 03/20/2015   Memory difficulty 03/20/2015   At high risk for falls 11/10/2012   DIABETIC  RETINOPATHY 03/17/2007   Type 2 diabetes mellitus with diabetic neuropathy, unspecified ([place]) 03/17/2007   Hypothyroidism 10/16/2006   Type 2 diabetes mellitus ([place]) 10/16/2006   OBESITY, NOS 10/16/2006   HYPERTENSION, BENIGN SYSTEMIC 10/16/2006   GASTROESOPHAGEAL REFLUX, NO ESOPHAGITIS 10/16/2006   Rheumatoid arthritis ([place]) 10/16/2006   Disorder of bone and cartilage 10/16/2006   INCONTINENCE, URGE 10/16/2006    Conditions to be addressed/monitored: ; Level of care concerns  Care Plan : Clinical Social Work  Updates made by Maurine Cane, LCSW since 04/04/2021 12:00 AM     Problem: Limited support in the home   Priority: High     Long-Range Goal: patient will explore options with the PACE program for additional support in the home   Start Date: 06/02/2020  Expected End Date: 08/18/2021  This Visit's Progress: On track  Recent Progress: On track  Priority: High  Note:   Assessment, progress & current barriers:  Patient's id is expired and needs to get new id before going to the bank to ask for statements( grandson will help with process) She would like to go visit PACE before making a decision, states monthly fee will be approx $400 per  Patient unable to consistently perform activities of daily living needs additional assistance in the home in order to meet this unmet need Wheelchair bound; Limited support at home Interventions : Assessed needs, level of care concerns, progress and barriers Collaborated with patient's grandson Jimmie Molly to assist with getting bank statements for PACE application Collaborated with PACE rep.  Nilda Simmer [phone]; to provide an update: She net with patient and grandson to explain PACE program They will think about it and get back with her ( they need to provide 3  months bank statements and copy of life insurance policy) Other interventions provided: Solution-Focused Strategies, Emotional/Supportive Counseling, and Problem Solving Patient Goals/Self-Care Activities: Over the next 30 days PACE will need 3 months bank statements before starting your application  Please talk with your grandson to help with getting the information       [name], [place] / [place]   [phone] 12:17 PM

## 2021-04-04 NOTE — Patient Instructions (Signed)
Visit Information   Goals Addressed             This Visit's Progress    tour PACE program   On track    Patient Goals/Self-Care Activities: PACE will need 3 months bank statements before starting your application  Please talk with your grandson to help with getting the information        Patient's grandson verbalizes understanding of instructions provided today.   Will follow up with you in one week.  Please call the office prior to our next encounter if needs arise   [name], [place] Management & Coordination  [phone]

## 2021-04-11 ENCOUNTER — Ambulatory Visit: Payer: Self-pay | Admitting: Licensed Clinical Social Worker

## 2021-04-11 DIAGNOSIS — Z789 Other specified health status: Secondary | ICD-10-CM

## 2021-04-11 NOTE — Chronic Care Management (AMB) (Signed)
  Care Management  Collaboration  Note  04/11/2021 Name: [name] MRN: [id number] DOB: 1945/10/15  [name] is a 75 y.o. year old female who is a primary care patient of [name], Kari Limber, MD. The CCM team was consulted reference care coordination needs for Level of Care Concerns.  Assessment: Patient was not interviewed or contacted during this encounter. See Care Plan or interventions for patient self-care actives.   Intervention:Conducted brief assessment, recommendations and relevant information discussed. CCM LCSW collaborated with patient's grandson Furniture conservator/restorer and PACE program rep .    Follow up Plan: No follow up scheduled with CCM team at this time. Will follow up with patient in 30 days or sooner if needed .Patient will call office if needed prior to next encounter. CCM LCSW will continue to collaborate with PACE in order to meet patient's needs .   Review of patient past medical history, allergies, medications, and health status, including review of pertinent consultant reports was performed as part of comprehensive evaluation and provision of care management/care coordination services.   Care Plan Conditions to be addressed/monitored per PCP order: , Level of care concerns   Care Plan : Clinical Social Work  Updates made by Kari Cane, LCSW since 04/11/2021 12:00 AM     Problem: Limited support in the home   Priority: High     Long-Range Goal: patient will explore options with the PACE program for additional support in the home   Start Date: 06/02/2020  Expected End Date: 08/18/2021  This Visit's Progress: On track  Recent Progress: On track  Priority: High  Note:   Assessment, progress & current barriers:  Patient's id is expired and needs to get new id before going to the bank to ask for statements( grandson will help with process) She would like to go visit PACE before making a decision, states monthly fee will be approx $400 per  Patient unable to  consistently perform activities of daily living needs additional assistance in the home in order to meet this unmet need Wheelchair bound; Limited support at home Interventions : Assessed needs, level of care concerns, progress and barriers Collaborated with patient's grandson [name]: he has bank statements for PACE application Collaborated with PACE rep.  [name] [phone]; She will contact patient and grandson to schedule site visit to PACE PACE will also assist with Medicaid application  Other interventions provided: Solution-Focused Strategies, Emotional/Supportive Counseling, and Problem Solving Patient Goals/Self-Care Activities: Over the next 30 days Site visit at [place], [place] / [place]   [phone] 4:20 PM

## 2021-04-11 NOTE — Patient Instructions (Signed)
   Patient was not contacted during this encounter.  LCSW collaborated with care team to accomplish patient's care plan goal   Frazer [place], LCSW Care Management & Coordination  [phone]  

## 2021-04-16 ENCOUNTER — Encounter (INDEPENDENT_AMBULATORY_CARE_PROVIDER_SITE_OTHER): Payer: Medicare Other | Admitting: Ophthalmology

## 2021-04-16 ENCOUNTER — Ambulatory Visit: Payer: Medicare Other

## 2021-04-16 NOTE — Chronic Care Management (AMB) (Signed)
Chronic Care Management   CCM RN Visit Note  04/16/2021 Name: [name] MRN: [id number] DOB: 1946-03-21  Subjective: [name] is a 75 y.o. year old female who is a primary care patient of [name], MD. The care management team was consulted for assistance with disease management and care coordination needs.    Engaged with patient by telephone for follow up visit in response to provider referral for case management and/or care coordination services.   Consent to Services:  The patient was given information about Chronic Care Management services, agreed to services, and gave verbal consent prior to initiation of services.  Please see initial visit note for detailed documentation.   Patient agreed to services and verbal consent obtained.    Assessment:  The patient explained that a representative from pace will come to home tomorrow to talk about signing her up for the program . See Care Plan below for interventions and patient self-care actives. Follow up Plan: Patient would like continued follow-up.  CCM RNCM will outreach the patient within the next 4 weeks  Patient will call office if needed prior to next encounter t: Review of patient past medical history, allergies, medications, health status, including review of consultants reports, laboratory and other test data, was performed as part of comprehensive evaluation and provision of chronic care management services.   SDOH (Social Determinants of Health) assessments and interventions performed:  No  CCM Care Plan  Allergies  Allergen Reactions   Feraheme [Ferumoxytol] Itching   Rosiglitazone Maleate Other (See Comments)    REACTION: Difficulty walking, Fatigue, shortness of breath    Outpatient Encounter Medications as of 04/16/2021  Medication Sig   acetaminophen (TYLENOL) 325 MG tablet Take 2 tablets (650 mg total) by mouth every 6 (six) hours.   ferrous sulfate 324 (65 Fe) MG TBEC TAKE 1 TABLET BY MOUTH  DAILY WITH BREAKFAST   furosemide (LASIX) 40 MG tablet TAKE 1 TABLET BY MOUTH EVERY DAY   gabapentin (NEURONTIN) 100 MG capsule TAKE 1 CAPSULE (100 MG TOTAL) BY MOUTH EVERY 8 (EIGHT) HOURS.   Incontinence Supply Disposable (INCONTINENCE BRIEF MEDIUM) MISC Wear as needed for incontinence   levothyroxine (SYNTHROID) 150 MCG tablet TAKE 1 TABLET BY MOUTH EVERY DAY   lidocaine (LIDODERM) 5 % Apply one patch to each lower limbs for 12 hours a day as needed for phantom pain. Remove after 12 hours or as directed by MD   Misc. Devices MISC Mepilex silver sulfate-foam bandage 6x6" change dressing weekly   PROAIR HFA 108 (90 Base) MCG/ACT inhaler INHALE 2 PUFFS INTO THE LUNGS EVERY 6 HOURS AS NEEDED FOR WHEEZING OR SHORTNESS OF BREATH   rosuvastatin (CRESTOR) 20 MG tablet Take 1 tablet (20 mg total) by mouth daily.   No facility-administered encounter medications on file as of 04/16/2021.    Patient Active Problem List   Diagnosis Date Noted   Vertebral fracture, osteoporotic ([place]) 01/22/2021   ILD (interstitial lung disease) ([place]) 11/28/2020   Controlled type 2 diabetes mellitus with stable proliferative retinopathy of both eyes, with long-term current use of insulin ([place]) 08/07/2020   Pseudophakia of both eyes 08/07/2020   At high risk for pressure injury of skin 12/16/2019   Uncontrolled type 2 diabetes mellitus with hyperglycemia ([place]) 11/12/2019   S/P AKA (above knee amputation) bilateral ([place]) 11/12/2019   Food insecurity 11/12/2019   Wound infection    Critical lower limb ischemia (HCC)    Moderate protein-calorie malnutrition ([place])    Left  breast mass 06/02/2019   Stress 06/02/2019   Peripheral vascular disease ([place]) 07/22/2018   Dyspnea 03/23/2018   Impaired functional mobility, balance, gait, and endurance 03/02/2018    Class: Diagnosis of   Abnormality of gait 02/26/2018   Iron deficiency anemia 03/19/2017   Vision disturbance 03/20/2015   Memory difficulty 03/20/2015   At high  risk for falls 11/10/2012   DIABETIC  RETINOPATHY 03/17/2007   Type 2 diabetes mellitus with diabetic neuropathy, unspecified ([place]) 03/17/2007   Hypothyroidism 10/16/2006   Type 2 diabetes mellitus ([place]) 10/16/2006   OBESITY, NOS 10/16/2006   HYPERTENSION, BENIGN SYSTEMIC 10/16/2006   GASTROESOPHAGEAL REFLUX, NO ESOPHAGITIS 10/16/2006   Rheumatoid arthritis ([place]) 10/16/2006   Disorder of bone and cartilage 10/16/2006   INCONTINENCE, URGE 10/16/2006    Conditions to be addressed/monitored: Quality of life  Care Plan : RN Case Manager  Updates made by [name], RN since 04/16/2021 12:00 AM     Problem: Quality of Life  Maintained   Priority: Medium  Onset Date: 07/05/2020  Note:   Current Barriers:  Chronic Disease Management support and education needs/crisis intervention in patient with bilateral below the knee amputation, Iron deficiency anemia, and community resources/urgent housing crisis  Knowledge Deficits related to imbalanced nutrition less than body requirements as evidenced by Moderate protein-calorie malnutrition  Nurse Case Manager Clinical Goal(s):  Over the next 90 days, patient will verbalize understanding of plan   Interventions:  Evaluation of current treatment plan related patient's adherence to plan as established by provider. Provided education to patient re: nutrition and wound healing Collaborated with SW regarding transportation- patient states that she has scat transportation  Discussed plans with patient for ongoing care management follow up and provided patient with direct contact information for care management team expression of thoughts about present/future encouraged independence in all possible areas promoted life review by storytelling encouraged patient strengths promoted social relationships promoted 04/16/21: Spoke with the patient today and she spent some time today talking about her son's girlfriend has moved back into the home.  We went  over the options that have been discussed before.  She states that she may have to go to the magistrate's office to have this person removed from her home. 04/16/21:  She also states that a representative from [employer] is coming to her home to talk with her tomorrow to help her sign her up.  She is a little weary because it is something new.  I explained that it will help her with more resources.  She will be able to socialize with people her age. CCM RN informed LCSW Laurance Flatten of the information.   Patient Goals/Self Care Activities:  Patient verbalizes understanding of plan  Attends all scheduled provider appointments Calls provider office for new concerns or questions Encouraged patient to write down information when she has appointments for follow up tell my story and reason for my visit make a list of questions ask questions repeat what I heard to make sure I understand bring a list of my medicines to the visit speak up when I don't understand         [name] RN, BSN, [place] Phone: [phone] I Fax: [phone]

## 2021-04-16 NOTE — Patient Instructions (Signed)
Visit Information  [name]  it was nice speaking with you. Please call me directly [phone] if you have questions about the goals we discussed.   Goals Addressed               This Visit's Progress     " I am trying to cope with changes in my life" (pt-stated)         Timeframe:  Long-Range Goal Priority:  Medium Start Date:       11/13/19                      Expected End Date: 06/18/21       Patient Goals/Self Care Activities:  Patient verbalizes understanding of plan  Attends all scheduled provider appointments Calls provider office for new concerns or questions tell my story and reason for my visit ask questions repeat what I heard to make sure I understand speak up when I don't understand       The patient verbalizes understanding of the information and instructions discussed today.  Our next appointment is scheduled for  05/14/21.   Please feel free to call me or the office if you have any questions or concerns.  [name] RN, BSN, [facility] Care Management Coordinator [place] Phone: [phone] I Fax: [phone]

## 2021-04-18 ENCOUNTER — Ambulatory Visit: Payer: Self-pay | Admitting: Licensed Clinical Social Worker

## 2021-04-18 NOTE — Patient Instructions (Signed)
   Patient was not contacted during this encounter.  LCSW collaborated with care team to accomplish patient's care plan goal   Jonnae Fonseca, LCSW Care Management & Coordination  [phone]  

## 2021-04-18 NOTE — Chronic Care Management (AMB) (Signed)
  Care Management  Collaboration  Note  04/18/2021 Name: [name] MRN: [id number] DOB: Nov 29, 1945  [name] is a 75 y.o. year old female who is a primary care patient of [name], Delorse Limber, MD. The CCM team was consulted reference care coordination needs for Level of Care Concerns.  Assessment: Patient was not interviewed or contacted during this encounter. See Care Plan or interventions for patient self-care actives.   Intervention:Conducted brief assessment, recommendations and relevant information discussed. CCM LCSW collaborated with CCM RN and Quar[place] manager  .    Follow up Plan: CCM LCSW will continue to collaborate with CCM RN and PACE Rep in order to meet patient's needs .   Review of patient past medical history, allergies, medications, and health status, including review of pertinent consultant reports was performed as part of comprehensive evaluation and provision of care management/care coordination services.   Care Plan Conditions to be addressed/monitored per PCP order: , Level of care concerns   Care Plan : Clinical Social Work  Updates made by Maurine Cane, LCSW since 04/18/2021 12:00 AM     Problem: Limited support in the home   Priority: High     Long-Range Goal: patient will explore options with the PACE program for additional support in the home   Start Date: 06/02/2020  Expected End Date: 08/18/2021  This Visit's Progress: On track  Recent Progress: On track  Priority: High  Note:   Assessment, progress & current barriers:  Patient's id is expired and needs to get new id before going to the bank to ask for statements( grandson will help with process) She would like to go visit PACE before making a decision, states monthly fee will be approx $400 per  Patient unable to consistently perform activities of daily living needs additional assistance in the home in order to meet this unmet need Wheelchair bound; Limited support at  home Interventions : Assessed needs, level of care concerns, progress and barriers Collaborated with CCM RN for patient's ongoing needs Collaborated with PACE rep.  Nilda Simmer [phone] during encounter for update on PACE  Medicaid completed during home visit this week; will f/u with patient and family next week  Other interventions provided: Solution-Focused Strategies, Emotional/Supportive Counseling, and Problem Solving Patient Goals/Self-Care Activities: Over the next 30 days Site visit at [place], [place] / [place]   [phone] 9:01 AM

## 2021-04-19 ENCOUNTER — Ambulatory Visit (INDEPENDENT_AMBULATORY_CARE_PROVIDER_SITE_OTHER): Payer: Medicare Other | Admitting: Family Medicine

## 2021-04-19 ENCOUNTER — Other Ambulatory Visit: Payer: Self-pay

## 2021-04-19 ENCOUNTER — Encounter: Payer: Self-pay | Admitting: Family Medicine

## 2021-04-19 VITALS — BP 160/90 | HR 86

## 2021-04-19 DIAGNOSIS — I700 Atherosclerosis of aorta: Secondary | ICD-10-CM | POA: Diagnosis not present

## 2021-04-19 DIAGNOSIS — I10 Essential (primary) hypertension: Secondary | ICD-10-CM | POA: Diagnosis not present

## 2021-04-19 DIAGNOSIS — Z862 Personal history of diseases of the blood and blood-forming organs and certain disorders involving the immune mechanism: Secondary | ICD-10-CM

## 2021-04-19 DIAGNOSIS — Z609 Problem related to social environment, unspecified: Secondary | ICD-10-CM | POA: Diagnosis not present

## 2021-04-19 DIAGNOSIS — I2510 Atherosclerotic heart disease of native coronary artery without angina pectoris: Secondary | ICD-10-CM | POA: Diagnosis not present

## 2021-04-19 DIAGNOSIS — M05742 Rheumatoid arthritis with rheumatoid factor of left hand without organ or systems involvement: Secondary | ICD-10-CM | POA: Diagnosis not present

## 2021-04-19 DIAGNOSIS — J849 Interstitial pulmonary disease, unspecified: Secondary | ICD-10-CM | POA: Diagnosis not present

## 2021-04-19 DIAGNOSIS — M05741 Rheumatoid arthritis with rheumatoid factor of right hand without organ or systems involvement: Secondary | ICD-10-CM | POA: Diagnosis not present

## 2021-04-19 DIAGNOSIS — D509 Iron deficiency anemia, unspecified: Secondary | ICD-10-CM | POA: Diagnosis not present

## 2021-04-19 DIAGNOSIS — E1140 Type 2 diabetes mellitus with diabetic neuropathy, unspecified: Secondary | ICD-10-CM | POA: Diagnosis not present

## 2021-04-19 LAB — POCT GLYCOSYLATED HEMOGLOBIN (HGB A1C): HbA1c, POC (controlled diabetic range): 6.2 % (ref 0.0–7.0)

## 2021-04-19 MED ORDER — AMLODIPINE BESYLATE 10 MG PO TABS: 10.0000 mg | ORAL_TABLET | Freq: Every day | ORAL | 3 refills | Status: DC

## 2021-04-19 MED ORDER — ASPIRIN EC 81 MG PO TBEC: 81.0000 mg | DELAYED_RELEASE_TABLET | Freq: Every day | ORAL | 11 refills | Status: DC
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
## 2021-04-20 ENCOUNTER — Encounter: Payer: Self-pay | Admitting: Family Medicine

## 2021-04-20 LAB — BASIC METABOLIC PANEL
BUN/Creatinine Ratio: 23 (ref 12–28)
BUN: 25 mg/dL (ref 8–27)
CO2: 22 mmol/L (ref 20–29)
Calcium: 9.5 mg/dL (ref 8.7–10.3)
Chloride: 103 mmol/L (ref 96–106)
Creatinine, Ser: 1.11 mg/dL — ABNORMAL HIGH (ref 0.57–1.00)
Glucose: 82 mg/dL (ref 65–99)
Potassium: 4.3 mmol/L (ref 3.5–5.2)
Sodium: 137 mmol/L (ref 134–144)
eGFR: 52 mL/min/{1.73_m2} — ABNORMAL LOW (ref >59)

## 2021-04-20 LAB — CBC
Hematocrit: 28.2 % — ABNORMAL LOW (ref 34.0–46.6)
Hemoglobin: 9.5 g/dL — ABNORMAL LOW (ref 11.1–15.9)
MCH: 29.1 pg (ref 26.6–33.0)
MCHC: 33.7 g/dL (ref 31.5–35.7)
MCV: 87 fL (ref 79–97)
Platelets: 335 10*3/uL (ref 150–450)
RBC: 3.26 x10E6/uL — ABNORMAL LOW (ref 3.77–5.28)
RDW: 11.9 % (ref 11.7–15.4)
WBC: 5 10*3/uL (ref 3.4–10.8)

## 2021-04-25 DIAGNOSIS — Z609 Problem related to social environment, unspecified: Secondary | ICD-10-CM | POA: Insufficient documentation

## 2021-04-25 DIAGNOSIS — I2510 Atherosclerotic heart disease of native coronary artery without angina pectoris: Secondary | ICD-10-CM | POA: Insufficient documentation

## 2021-04-25 DIAGNOSIS — I700 Atherosclerosis of aorta: Secondary | ICD-10-CM | POA: Insufficient documentation

## 2021-04-25 NOTE — Assessment & Plan Note (Signed)
Advised to schedule follow up with pulmonology, provided phone # for this

## 2021-04-25 NOTE — Assessment & Plan Note (Signed)
Noted on CT lung, along with aortic atherosclerosis. Add aspirin 81mg  daily.

## 2021-04-25 NOTE — Assessment & Plan Note (Signed)
Update CBC today. Continues on iron. Referring back to GI for colonoscopy.

## 2021-04-25 NOTE — Assessment & Plan Note (Signed)
Refer back to rheumatology. Ideally should be on medications for her RA.
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
## 2021-04-25 NOTE — Assessment & Plan Note (Signed)
Blood pressure elevated today. Start amlodipine 10mg  daily. Follow up in 1 month to recheck

## 2021-04-25 NOTE — Assessment & Plan Note (Signed)
A1c today is 6.2 - at goal despite no diabetes medications Continue to monitor q6 months

## 2021-04-27 ENCOUNTER — Other Ambulatory Visit: Payer: Self-pay

## 2021-04-27 ENCOUNTER — Encounter: Payer: Self-pay | Admitting: Rheumatology

## 2021-04-27 ENCOUNTER — Ambulatory Visit (INDEPENDENT_AMBULATORY_CARE_PROVIDER_SITE_OTHER): Payer: Medicare Other | Admitting: Rheumatology

## 2021-04-27 VITALS — BP 153/75 | HR 75

## 2021-04-27 DIAGNOSIS — J84112 Idiopathic pulmonary fibrosis: Secondary | ICD-10-CM | POA: Diagnosis not present

## 2021-04-27 DIAGNOSIS — Z8639 Personal history of other endocrine, nutritional and metabolic disease: Secondary | ICD-10-CM

## 2021-04-27 DIAGNOSIS — Z8719 Personal history of other diseases of the digestive system: Secondary | ICD-10-CM

## 2021-04-27 DIAGNOSIS — M79641 Pain in right hand: Secondary | ICD-10-CM | POA: Diagnosis not present

## 2021-04-27 DIAGNOSIS — I10 Essential (primary) hypertension: Secondary | ICD-10-CM

## 2021-04-27 DIAGNOSIS — M542 Cervicalgia: Secondary | ICD-10-CM

## 2021-04-27 DIAGNOSIS — Z862 Personal history of diseases of the blood and blood-forming organs and certain disorders involving the immune mechanism: Secondary | ICD-10-CM | POA: Diagnosis not present

## 2021-04-27 DIAGNOSIS — Z79899 Other long term (current) drug therapy: Secondary | ICD-10-CM

## 2021-04-27 DIAGNOSIS — Z89611 Acquired absence of right leg above knee: Secondary | ICD-10-CM | POA: Diagnosis not present

## 2021-04-27 DIAGNOSIS — I739 Peripheral vascular disease, unspecified: Secondary | ICD-10-CM

## 2021-04-27 DIAGNOSIS — Z89612 Acquired absence of left leg above knee: Secondary | ICD-10-CM

## 2021-04-27 DIAGNOSIS — M79642 Pain in left hand: Secondary | ICD-10-CM | POA: Diagnosis not present

## 2021-04-27 DIAGNOSIS — M069 Rheumatoid arthritis, unspecified: Secondary | ICD-10-CM | POA: Diagnosis not present

## 2021-04-27 NOTE — Progress Notes (Signed)
Office Visit Note  Patient: [name]             Date of Birth: July 12, 1946           MRN: [id number]             PCP: [name], MD Referring: [name], MD Visit Date: 04/27/2021 Occupation: @GUAROCC @  Subjective:  Other (Neck pain )   History of Present Illness: [name] is a 75 y.o. female with a history of erosive rheumatoid arthritis diagnosed by Dr.Truslow several years ago.  She was treated with methotrexate and prednisone for 2 years from 2011-2013.  She has been off medication since then.  She was having discomfort in her joints at one point.  She states that February 2021 she underwent bilateral above-the-knee amputation.  She states her neck has been hurting recently.  She states her hands are deformed but she does not have any pain in her hands.  Activities of Daily Living:  Patient reports morning stiffness for all day. Patient Reports nocturnal pain.  Difficulty dressing/grooming: Reports Difficulty climbing stairs: Reports Difficulty getting out of chair: Reports Difficulty using hands for taps, buttons, cutlery, and/or writing: Reports  Review of Systems  Constitutional:  Negative for fatigue.  HENT:  Negative for mouth sores, mouth dryness and nose dryness.   Eyes:  Negative for pain, itching and dryness.  Respiratory:  Positive for shortness of breath. Negative for difficulty breathing.   Cardiovascular:  Negative for chest pain and palpitations.  Gastrointestinal:  Negative for blood in stool, constipation and diarrhea.  Endocrine: Negative for increased urination.  Genitourinary:  Negative for painful urination.  Musculoskeletal:  Positive for joint pain, joint pain and morning stiffness. Negative for joint swelling, myalgias, muscle tenderness and myalgias.  Skin:  Negative for color change, rash and redness.  Allergic/Immunologic: Negative for susceptible to infections.  Neurological:  Positive for dizziness and headaches.  Negative for numbness, memory loss and weakness.  Hematological:  Negative for bruising/bleeding tendency.  Psychiatric/Behavioral:  Negative for confusion.    PMFS History:  Patient Active Problem List   Diagnosis Date Noted   High risk social situation 04/25/2021   CAD (coronary artery disease) 04/25/2021   Aortic atherosclerosis ([place]) 04/25/2021   Vertebral fracture, osteoporotic ([place]) 01/22/2021   ILD (interstitial lung disease) ([place]) 11/28/2020   Controlled type 2 diabetes mellitus with stable proliferative retinopathy of both eyes, with long-term current use of insulin ([place]) 08/07/2020   Pseudophakia of both eyes 08/07/2020   At high risk for pressure injury of skin 12/16/2019   Uncontrolled type 2 diabetes mellitus with hyperglycemia ([place]) 11/12/2019   S/P AKA (above knee amputation) bilateral ([place]) 11/12/2019   Food insecurity 11/12/2019   Wound infection    Moderate protein-calorie malnutrition (H[facility])    Left breast mass 06/02/2019   Stress 06/02/2019   Peripheral vascular disease ([place]) 07/22/2018   Dyspnea 03/23/2018   Impaired functional mobility, balance, gait, and endurance 03/02/2018    Class: Diagnosis of   Abnormality of gait 02/26/2018   Iron deficiency anemia 03/19/2017   Vision disturbance 03/20/2015   Memory difficulty 03/20/2015   At high risk for falls 11/10/2012   DIABETIC  RETINOPATHY 03/17/2007   Type 2 diabetes mellitus with diabetic neuropathy, unspecified ([place]) 03/17/2007   Hypothyroidism 10/16/2006   Type 2 diabetes mellitus ([place]) 10/16/2006   OBESITY, NOS 10/16/2006   HYPERTENSION, BENIGN SYSTEMIC 10/16/2006   GASTROESOPHAGEAL REFLUX, NO ESOPHAGITIS 10/16/2006   Rheumatoid arthritis (  [place]) 10/16/2006   Disorder of bone and cartilage 10/16/2006   INCONTINENCE, URGE 10/16/2006    Past Medical History:  Diagnosis Date   Bilateral lower extremity edema 06/06/2014   Cataracts, bilateral 02/13/2011   Seen on eye exam 02/07/11. F/u in 12 months     Diabetes mellitus age 1   Diabetic ulcer of toe ([place]) 12/12/2015   Hyperlipidemia    Hypertension    Retinal detachment, old, partial    left   Retinopathy due to secondary diabetes mellitus ([place])    L>R, laser 3/07   Schizotypal personality disorder ([place])    Thyroid disease    Weight loss 01/22/2012    Family History  Problem Relation Age of Onset   Heart disease Mother    Hypertension Mother    Heart attack Mother    Pulmonary fibrosis Father    Liver disease Sister        transplant, liver   Colon cancer Brother    Liver cancer Brother    Colon cancer Maternal Grandmother    Diabetes Daughter    Breast cancer Niece    Past Surgical History:  Procedure Laterality Date   ABDOMINAL AORTOGRAM W/LOWER EXTREMITY N/A 07/23/2018   Procedure: ABDOMINAL AORTOGRAM W/LOWER EXTREMITY;  Surgeon: Marty Heck, MD;  Location: [facility];  Service: Cardiovascular;  Laterality: N/A;   ABOVE KNEE LEG AMPUTATION Bilateral 09/27/2019   AMPUTATION Bilateral 09/27/2019   Procedure: AMPUTATION ABOVE KNEE;  Surgeon: Angelia Mould, MD;  Location: [facility];  Service: Vascular;  Laterality: Bilateral;   BREAST EXCISIONAL BIOPSY Bilateral    BREAST SURGERY     CATARACT EXTRACTION     left    EYE SURGERY     ROTATOR CUFF REPAIR  1990's   left   Thyroid radiation ablation     for Graves Disease   TRANSTHORACIC ECHOCARDIOGRAM      EF55-65%, nml - 12/14/2004   Social History   Social History Narrative      Current Social History  06/16/2020   Who lives at home: Lives alone in one level home; grandson rotate and stay with her    Transportation: taxi. [employer] for medical appointments. [employer]    Important Relationships & Pets: "Everybody I meet." No pets 06/16/2020   Work / Education:  Retired/ 12 th grade 03/06/2017   Religious / Personal Beliefs: "I believe in God, [place], and the resurrection." 03/06/2017   Interests / Fun: crafts, yard work, going to [employer] 03/06/2017    Current stressors: furnace and hot water heater is not working ( has not worked in over 2 1/2 years)                                                                                                         Immunization History  Administered Date(s) Administered   Influenza Split 05/14/2012   Influenza Whole 07/10/2007, 05/11/2008, 05/13/2008   Influenza,inj,Quad PF,6+ Mos 06/20/2017, 06/02/2019   Influenza-Unspecified 06/30/2018   Moderna Sars-Covid-2 Vaccination 10/27/2019, 12/07/2019   PFIZER Comirnaty([place])Covid-19 Tri-Sucrose  Vaccine 11/28/2020   Pneumococcal Conjugate-13 01/14/2017   Pneumococcal Polysaccharide-23 07/19/1994, 02/17/2018   Td 10/22/2004   Tdap 02/24/2018, 12/10/2020     Objective: Vital Signs: BP (!) 153/75 (BP Location: Right Arm, Patient Position: Sitting, Cuff Size: Normal)   Pulse 75    Physical Exam Vitals and nursing note reviewed.  Constitutional:      Appearance: She is well-developed.  HENT:     Head: Normocephalic and atraumatic.  Eyes:     Conjunctiva/sclera: Conjunctivae normal.  Cardiovascular:     Rate and Rhythm: Normal rate and regular rhythm.     Heart sounds: Normal heart sounds.  Pulmonary:     Effort: Pulmonary effort is normal.     Breath sounds: Normal breath sounds.     Comments: Crackles audible in bilateral lung fields. Abdominal:     General: Bowel sounds are normal.     Palpations: Abdomen is soft.  Musculoskeletal:     Cervical back: Normal range of motion.     Comments: Bilateral AKA  Lymphadenopathy:     Cervical: No cervical adenopathy.  Skin:    General: Skin is warm and dry.     Capillary Refill: Capillary refill takes less than 2 seconds.  Neurological:     Mental Status: She is alert and oriented to person, place, and time.  Psychiatric:        Behavior: Behavior normal.     Musculoskeletal Exam: Patient had limited lateral rotation and flexion and extension of the cervical spine.  She had thoracic  kyphosis.  She had good range of motion of bilateral shoulder joints without discomfort.  Elbows were in good range of motion.  She has synovial thickening over right wrist joint and MCP joints.  She had bilateral 3rd nerve deviation.  She had no tenderness on palpation of her MCPs.  Hip joints were difficult to assess in the sitting position on a wheelchair.  She had AKA bilaterally.  CDAI Exam: CDAI Score: 3.2  Patient Global: 4 mm; Provider Global: 8 mm Swollen: 2 ; Tender: 1  Joint Exam 04/27/2021      Right  Left  Wrist  Swollen      MCP 2  Swollen      Cervical Spine   Tender        Investigation: No additional findings.  Imaging: No results found.  Recent Labs: Lab Results  Component Value Date   WBC 5.0 04/19/2021   HGB 9.5 (L) 04/19/2021   PLT 335 04/19/2021   NA 137 04/19/2021   K 4.3 04/19/2021   CL 103 04/19/2021   CO2 22 04/19/2021   GLUCOSE 82 04/19/2021   BUN 25 04/19/2021   CREATININE 1.11 (H) 04/19/2021   BILITOT 0.3 11/28/2020   ALKPHOS 75 11/28/2020   AST 10 11/28/2020   ALT 10 11/28/2020   PROT 9.5 (H) 11/28/2020   ALBUMIN 2.9 (L) 11/28/2020   CALCIUM 9.5 04/19/2021   GFRAA 75 11/18/2019    Speciality Comments: No specialty comments available.  Procedures:  No procedures performed Allergies: Feraheme [ferumoxytol] and Rosiglitazone maleate   Assessment / Plan:     Visit Diagnoses: Rheumatoid arthritis involving multiple sites, unspecified whether rheumatoid factor present ([place]) - Diagnosed by Dr. Charlestine Night when she was in 5s.  She was treated with methotrexate and prednisone.  She has been off medication since 2013.  Erosive disease was noted in the feet x-ray from the past.  Patient denies any joint pain or swelling.  She  states that she has a stiffness in her hands.  She also notices deformities in her hands.  I do not have any previous x-rays to review.  She has synovial thickening and mild synovitis in her right wrist joint.  She had good  range of motion of her shoulders.  Hip joints were difficult to assess.  I detailed discussion with the patient regarding possible use of Imuran with a history of UIP.  Indications side effects contraindications were discussed.  Patient is concerned about side effects especially infection.  She does not want to take any medications which will suppress her immune system and increase the risk of infection.  Based on her joint examination I do not see necessity to put her on an immunosuppressive agent.  The risk of causing infection with her limited mobility and increased risk of decubitus ulcer is higher than the benefit from the immunosuppressive agent as far as arthritis is concerned.  I would like input from Dr. Elsworth Soho if he would like to put her on immunosuppressive agent based on the ILD.  I have sent him a message.  I would wait for his response.  High risk medication use -(treated with methotrexate 25 mg p.o. weekly, folic acid, prednisone 5 mg p.o. daily from 2011 -2013 by Dr. Charlestine Night.)  I would not obtain any labs today as she is uncertain about future treatment.  She states that she plans to [facility] and would prefer to stay under care of physician so she does not have to come for follow-up visits.  I decided not to obtain any lab work today as patient is uncertain about the immunosuppressive therapy.  Neck pain -she has limited range of motion of cervical spine.  She has limited lateral rotation and flexion and extension.  I will obtain x-rays today.  Plan: DG Cervical Spine With Flex & Extend, DG Cervical Spine 2 or 3 views.  I will call patient with the x-ray results when available.  Pain in both hands -she has some discomfort in her hands and some synovial thickening and deformities.  Plan: DG Hand 2 View Left, DG Hand 2 View Right.  I will call patient with x-ray results when available.  S/P AKA (above knee amputation) bilateral ([place]) - 09/2019.  History of recurrent diabetic foot ulcer and  gangrene.  UIP-I reviewed her previous high-resolution CT and records of Dr. Elsworth Soho.  She was diagnosed on the basis of high-resolution CT.  She is followed by Dr. Elsworth Soho.  I am uncertain if he wants to put her on immunosuppression based on the risk versus benefits.  Patient states she has noticed some shortness of breath.  Her mobility is limited due to bilateral AKA.  Essential hypertension-her systolic blood pressure is slightly elevated today.  Other medical problems are listed as follows:  History of type 2 diabetes mellitus  PAD (peripheral artery disease) ([place])  History of gastroesophageal reflux (GERD)  History of hypothyroidism  Hx of iron deficiency anemia  Orders: Orders Placed This Encounter  Procedures   DG Cervical Spine With Flex & Extend   DG Cervical Spine 2 or 3 views   DG Hand 2 View Left   DG Hand 2 View Right    No orders of the defined types were placed in this encounter.    Follow-Up Instructions: Return if symptoms worsen or fail to improve, for Rheumatoid arthritis.   Bo Merino, MD  Note - This record has been created using Editor, commissioning.  Chart  creation errors have been sought, but may not always  have been located. Such creation errors do not reflect on  the standard of medical care.

## 2021-05-01 ENCOUNTER — Ambulatory Visit (INDEPENDENT_AMBULATORY_CARE_PROVIDER_SITE_OTHER): Payer: Medicare Other | Admitting: Ophthalmology

## 2021-05-01 ENCOUNTER — Other Ambulatory Visit: Payer: Self-pay

## 2021-05-01 ENCOUNTER — Encounter (INDEPENDENT_AMBULATORY_CARE_PROVIDER_SITE_OTHER): Payer: Self-pay | Admitting: Ophthalmology

## 2021-05-01 DIAGNOSIS — H353221 Exudative age-related macular degeneration, left eye, with active choroidal neovascularization: Secondary | ICD-10-CM

## 2021-05-01 DIAGNOSIS — Z794 Long term (current) use of insulin: Secondary | ICD-10-CM | POA: Diagnosis not present

## 2021-05-01 DIAGNOSIS — H3562 Retinal hemorrhage, left eye: Secondary | ICD-10-CM | POA: Diagnosis not present

## 2021-05-01 DIAGNOSIS — E113553 Type 2 diabetes mellitus with stable proliferative diabetic retinopathy, bilateral: Secondary | ICD-10-CM | POA: Diagnosis not present

## 2021-05-01 LAB — HM DIABETES EYE EXAM

## 2021-05-01 MED ORDER — BEVACIZUMAB 2.5 MG/0.1ML IZ SOSY
2.5000 mg | PREFILLED_SYRINGE | INTRAVITREAL | Status: AC | PRN
  Administered 2021-05-01: 2.5 mg via INTRAVITREAL

## 2021-05-01 NOTE — Assessment & Plan Note (Addendum)
Noted on this date with large subfoveal pigment epithelial detachment and apparent extension towards the temporal aspect of the nerve we will commence with therapy with antivegF, Avastin intravitreal today as we hope that the hemorrhage seen in a foveal location is emanating from the peripapillary CNVM with extension of hemorrhage to this region.

## 2021-05-01 NOTE — Assessment & Plan Note (Signed)
Wet ARMD, subretinal, subfoveal hemorrhage yet with good acuity

## 2021-05-01 NOTE — Progress Notes (Signed)
05/01/2021     CHIEF COMPLAINT Patient presents for  Chief Complaint  Patient presents with   Retina Follow Up      HISTORY OF PRESENT ILLNESS: [name] is a 75 y.o. female who presents to the clinic today for:   HPI     Retina Follow Up   Patient presents with  Diabetic Retinopathy.  In both eyes.  This started 6 months ago.  Duration of 6 months.        Comments   6 mos fu ou oct. Patient states vision is stable and unchanged since last visit. Denies any new floaters or FOL. A1C: 6.0 per patient       Last edited by Laurin Coder on 05/01/2021 10:22 AM.      Referring physician: [name], MD [place],  [place] [id number]  HISTORICAL INFORMATION:   Selected notes from the MEDICAL RECORD NUMBER    Lab Results  Component Value Date   HGBA1C 6.2 04/19/2021     CURRENT MEDICATIONS: No current outpatient medications on file. (Ophthalmic Drugs)   No current facility-administered medications for this visit. (Ophthalmic Drugs)   Current Outpatient Medications (Other)  Medication Sig   acetaminophen (TYLENOL) 325 MG tablet Take 2 tablets (650 mg total) by mouth every 6 (six) hours.   amLODipine (NORVASC) 10 MG tablet Take 1 tablet (10 mg total) by mouth at bedtime.   aspirin EC 81 MG tablet Take 1 tablet (81 mg total) by mouth daily. Swallow whole.   ferrous sulfate 324 (65 Fe) MG TBEC TAKE 1 TABLET BY MOUTH DAILY WITH BREAKFAST   furosemide (LASIX) 40 MG tablet TAKE 1 TABLET BY MOUTH EVERY DAY   gabapentin (NEURONTIN) 100 MG capsule TAKE 1 CAPSULE (100 MG TOTAL) BY MOUTH EVERY 8 (EIGHT) HOURS.   Incontinence Supply Disposable (INCONTINENCE BRIEF MEDIUM) MISC Wear as needed for incontinence   levothyroxine (SYNTHROID) 150 MCG tablet TAKE 1 TABLET BY MOUTH EVERY DAY   lidocaine (LIDODERM) 5 % Apply one patch to each lower limbs for 12 hours a day as needed for phantom pain. Remove after 12 hours or as directed by MD    Misc. Devices MISC Mepilex silver sulfate-foam bandage 6x6" change dressing weekly   PROAIR HFA 108 (90 Base) MCG/ACT inhaler INHALE 2 PUFFS INTO THE LUNGS EVERY 6 HOURS AS NEEDED FOR WHEEZING OR SHORTNESS OF BREATH   rosuvastatin (CRESTOR) 20 MG tablet Take 1 tablet (20 mg total) by mouth daily.   No current facility-administered medications for this visit. (Other)      REVIEW OF SYSTEMS:    ALLERGIES Allergies  Allergen Reactions   Feraheme [Ferumoxytol] Itching   Rosiglitazone Maleate Other (See Comments)    REACTION: Difficulty walking, Fatigue, shortness of breath    PAST MEDICAL HISTORY Past Medical History:  Diagnosis Date   Bilateral lower extremity edema 06/06/2014   Cataracts, bilateral 02/13/2011   Seen on eye exam 02/07/11. F/u in 12 months    Diabetes mellitus age 83   Diabetic ulcer of toe ([place]) 12/12/2015   Hyperlipidemia    Hypertension    Retinal detachment, old, partial    left   Retinopathy due to secondary diabetes mellitus ([place])    L>R, laser 3/07   Schizotypal personality disorder ([place])    Thyroid disease    Weight loss 01/22/2012   Past Surgical History:  Procedure Laterality Date   ABDOMINAL AORTOGRAM W/LOWER EXTREMITY N/A 07/23/2018   Procedure: ABDOMINAL  AORTOGRAM W/LOWER EXTREMITY;  Surgeon: Marty Heck, MD;  Location: [facility];  Service: Cardiovascular;  Laterality: N/A;   ABOVE KNEE LEG AMPUTATION Bilateral 09/27/2019   AMPUTATION Bilateral 09/27/2019   Procedure: AMPUTATION ABOVE KNEE;  Surgeon: Angelia Mould, MD;  Location: [facility] OR;  Service: Vascular;  Laterality: Bilateral;   BREAST EXCISIONAL BIOPSY Bilateral    BREAST SURGERY     CATARACT EXTRACTION     left    EYE SURGERY     ROTATOR CUFF REPAIR  1990's   left   Thyroid radiation ablation     for Graves Disease   TRANSTHORACIC ECHOCARDIOGRAM      EF55-65%, nml - 12/14/2004    FAMILY HISTORY Family History  Problem Relation Age of Onset   Heart disease  Mother    Hypertension Mother    Heart attack Mother    Pulmonary fibrosis Father    Liver disease Sister        transplant, liver   Colon cancer Brother    Liver cancer Brother    Colon cancer Maternal Grandmother    Diabetes Daughter    Breast cancer Niece     SOCIAL HISTORY Social History   Tobacco Use   Smoking status: Never   Smokeless tobacco: Never  Vaping Use   Vaping Use: Never used  Substance Use Topics   Alcohol use: No   Drug use: No         OPHTHALMIC EXAM:  Base Eye Exam     Visual Acuity (ETDRS)       Right Left   Dist [place]  20/30 -1   Dist cc 20/60 -2    Dist ph cc NI          Tonometry (Tonopen, 10:30 AM)       Right Left   Pressure 13 unab         Pupils       Pupils Dark Shape React APD   Right PERRL 4 Round Sluggish None   Left PERRL 4 Round Sluggish None         Extraocular Movement       Right Left    Full Full         Neuro/Psych     Oriented x3: Yes   Mood/Affect: Normal         Dilation     Both eyes: 1.0% Mydriacyl, 2.5% Phenylephrine @ 10:30 AM           Slit Lamp and Fundus Exam     External Exam       Right Left   External Normal Normal         Slit Lamp Exam       Right Left   Lids/Lashes Normal Normal   Conjunctiva/Sclera White and quiet White and quiet   Cornea Clear Clear   Anterior Chamber Deep and quiet Deep and quiet   Iris Round and reactive Round and reactive   Lens Open posterior capsule, Centered posterior chamber intraocular lens Open posterior capsule, Centered posterior chamber intraocular lens   Anterior Vitreous Normal Normal         Fundus Exam       Right Left   Posterior Vitreous Normal Normal   Disc Normal Old fibrous proliferations on the nerve, stable   C/D Ratio 0.5 0.5   Macula Focal laser scars Focal laser scars,,,,new onset Subretinal hemorrhage 1 disc area size subfoveal   Vessels PDR-quiet PDR-quiet  Periphery Laser scars, good PRP, no active N/V  Laser scars, good PRP, no active N/V            IMAGING AND PROCEDURES  Imaging and Procedures for 05/01/21  OCT, Retina - OU - Both Eyes       Right Eye Quality was good. Scan locations included subfoveal. Central Foveal Thickness: 169. Progression has been stable.   Left Eye Quality was poor. Central Foveal Thickness: 369. Findings include abnormal foveal contour, choroidal neovascular membrane.   Notes OD, diffuse macular atrophy temporally, no active CSME, stable will observe  New onset large subfoveal pigment epithelial detachment left eye which clinically is associated with hemorrhage which could be a peripapillary CNVM.       Intravitreal Injection, Pharmacologic Agent - OS - Left Eye       Time Out 05/01/2021. 11:15 AM. Confirmed correct patient, procedure, site, and patient consented.   Anesthesia Topical anesthesia was used. Anesthetic medications included Akten 3.5%.   Procedure Preparation included 5% betadine to ocular surface, 10% betadine to eyelids, Ofloxacin . A 30 gauge needle was used.   Injection: 2.5 mg bevacizumab 2.5 MG/0.1ML   Route: Intravitreal, Site: Left Eye   NDC: [phone], Lot: [id number]   Post-op Post injection exam found visual acuity of at least counting fingers. The patient tolerated the procedure well. There were no complications. The patient received written and verbal post procedure care education. Post injection medications included ocuflox.              ASSESSMENT/PLAN:  Exudative age-related macular degeneration of left eye with active choroidal neovascularization (HCC) Noted on this date with large subfoveal pigment epithelial detachment and apparent extension towards the temporal aspect of the nerve we will commence with therapy with antivegF, Avastin intravitreal today as we hope that the hemorrhage seen in a foveal location is emanating from the peripapillary CNVM with extension of hemorrhage to this  region.  Deep retinal hemorrhage, left Wet ARMD, subretinal, subfoveal hemorrhage yet with good acuity     ICD-10-CM   1. Exudative age-related macular degeneration of left eye with active choroidal neovascularization (HCC)  H35.3221 Intravitreal Injection, Pharmacologic Agent - OS - Left Eye    bevacizumab (AVASTIN) SOSY 2.5 mg    2. Controlled type 2 diabetes mellitus with stable proliferative retinopathy of both eyes, with long-term current use of insulin (HCC)  E11.3553 OCT, Retina - OU - Both Eyes   Z79.4     3. Deep retinal hemorrhage, left  H35.62       1.  OS with good visual acuity yet with new onset subfoveal 1 disc area deep retinal hemorrhage associated with PED.  OCT confirms likely this is from a peripapillary CNVM on the temporal aspect of the nerve.  I explained the patient that even with good visual acuity permanent vision loss is at risk if no therapy is undertaken and that went therapy stabilization of condition and best chance of preservation of vision occurs.  We will commence with intravitreal Avastin OS today and examination next in 5 to 6 weeks  2.  3.  Ophthalmic Meds Ordered this visit:  Meds ordered this encounter  Medications   bevacizumab (AVASTIN) SOSY 2.5 mg       Return in about 6 weeks (around 06/12/2021) for dilate, OS, AVASTIN OCT.  There are no Patient Instructions on file for this visit.   Explained the diagnoses, plan, and follow up with the patient and they expressed understanding.  Patient  expressed understanding of the importance of proper follow up care.   Clent Demark Tamaria Dunleavy M.D. Diseases & Surgery of the Retina and Vitreous Retina & Diabetic [place] 05/01/21     Abbreviations: M myopia (nearsighted); A astigmatism; H hyperopia (farsighted); P presbyopia; Mrx spectacle prescription;  CTL contact lenses; OD right eye; OS left eye; OU both eyes  XT exotropia; ET esotropia; PEK punctate epithelial keratitis; PEE punctate epithelial  erosions; DES dry eye syndrome; MGD meibomian gland dysfunction; ATs artificial tears; PFAT's preservative free artificial tears; [place] nuclear sclerotic cataract; PSC posterior subcapsular cataract; ERM epi-retinal membrane; PVD posterior vitreous detachment; RD retinal detachment; DM diabetes mellitus; DR diabetic retinopathy; NPDR non-proliferative diabetic retinopathy; PDR proliferative diabetic retinopathy; CSME clinically significant macular edema; DME diabetic macular edema; dbh dot blot hemorrhages; CWS cotton wool spot; POAG primary open angle glaucoma; C/D cup-to-disc ratio; HVF humphrey visual field; GVF goldmann visual field; OCT optical coherence tomography; IOP intraocular pressure; BRVO Branch retinal vein occlusion; CRVO central retinal vein occlusion; CRAO central retinal artery occlusion; BRAO branch retinal artery occlusion; RT retinal tear; SB scleral buckle; PPV pars plana vitrectomy; VH Vitreous hemorrhage; PRP panretinal laser photocoagulation; IVK intravitreal kenalog; VMT vitreomacular traction; MH Macular hole;  NVD neovascularization of the disc; NVE neovascularization elsewhere; AREDS age related eye disease study; ARMD age related macular degeneration; POAG primary open angle glaucoma; EBMD epithelial/anterior basement membrane dystrophy; ACIOL anterior chamber intraocular lens; IOL intraocular lens; PCIOL posterior chamber intraocular lens; Phaco/IOL phacoemulsification with intraocular lens placement; [place] photorefractive keratectomy; LASIK laser assisted in situ keratomileusis; HTN hypertension; DM diabetes mellitus; COPD chronic obstructive pulmonary disease

## 2021-05-07 ENCOUNTER — Encounter: Payer: Self-pay | Admitting: Family Medicine

## 2021-05-07 ENCOUNTER — Other Ambulatory Visit: Payer: Self-pay | Admitting: Family Medicine

## 2021-05-07 DIAGNOSIS — D649 Anemia, unspecified: Secondary | ICD-10-CM

## 2021-05-07 DIAGNOSIS — D508 Other iron deficiency anemias: Secondary | ICD-10-CM

## 2021-05-14 ENCOUNTER — Telehealth: Payer: Self-pay

## 2021-05-14 ENCOUNTER — Telehealth: Payer: Medicare Other

## 2021-05-14 NOTE — Telephone Encounter (Signed)
   RN Case Manager Care Management   Phone Outreach    05/14/2021 Name: [name] MRN: [id number] DOB: 08/15/1946  [name] is a 75 y.o. year old female who is a primary care patient of [name], MD .   Telephone outreach was unsuccessful A HIPPA compliant phone message was left for the patient providing contact information and requesting a return call.   Plan:Will route chart to Care Guide to see if patient would like to reschedule phone appointment   Review of patient status, including review of consultants reports, relevant laboratory and other test results, and collaboration with appropriate care team members and the patient's provider was performed as part of comprehensive patient evaluation and provision of care management services.    [name] RN, BSN, [facility] Care Management Coordinator [place] Phone: [phone] I Fax: [phone]

## 2021-05-16 ENCOUNTER — Telehealth: Payer: Self-pay | Admitting: *Deleted

## 2021-05-16 NOTE — Chronic Care Management (AMB) (Signed)
  Care Management   Note  05/16/2021 Name: BIBI ECONOMOS MRN: [id number] DOB: 1945-10-04  Shayra JOYANNE EDDINGER is a 75 y.o. year old female who is a primary care patient of [name], MD and is actively engaged with the care management team. I reached out to [name] by phone today to assist with re-scheduling a follow up visit with the RN Case Manager  Follow up plan: Unsuccessful telephone outreach attempt made. A HIPAA compliant phone message was left for the patient providing contact information and requesting a return call.  The care management team will reach out to the patient again over the next 7 days.  If patient returns call to provider office, please advise to call [place]  at [phone].  [place] Management  Direct Dial: [phone]

## 2021-05-22 ENCOUNTER — Ambulatory Visit: Payer: Self-pay | Admitting: Licensed Clinical Social Worker

## 2021-05-22 NOTE — Chronic Care Management (AMB) (Signed)
  Care Management   Note  05/22/2021 Name: ANGELIKA JERRETT MRN: [id number] DOB: 06/04/1946  Shanitha YOLANDER GOODIE is a 75 y.o. year old female who is a primary care patient of [name], MD and is actively engaged with the care management team. I reached out to [name] by phone today to assist with re-scheduling a follow up visit with the RN Case Manager  Follow up plan: Patient is now working with [facility]. The care management team is available to follow up with the patient after provider conversation with the patient regarding recommendation for care management engagement and subsequent re-referral to the care management team.   [place] Management  Direct Dial: [phone]

## 2021-05-22 NOTE — Patient Instructions (Signed)
Visit Information   Goals Addressed             This Visit's Progress    COMPLETED: Start [facility] program       Patient Goals/Self-Care Activities: Congratulations on starting the [facility] program       Patient was not contacted during this encounter.  LCSW collaborated with care team to accomplish patient's care plan goal   [name], [place]

## 2021-05-22 NOTE — Progress Notes (Signed)
Patient is now working with [facility] per [name] LCSW. Will close telephone encounter.    [place] Management  Direct Dial: [phone]

## 2021-05-22 NOTE — Chronic Care Management (AMB) (Signed)
  Care Management  Collaboration  Note  05/22/2021 Name: [name] MRN: [id number] DOB: 1946-03-19  [name] is a 75 y.o. year old female who is a primary care patient of [name], Delorse Limber, MD. The CCM team was consulted reference care coordination needs for Level of Care Concerns.for [facility] Program  Assessment: Patient was not interviewed or contacted during this encounter. Patient is officially enrolled in the [facility] program and will receive all care with [facility].See Care Plan or interventions for patient self-care actives.   Intervention:Conducted brief assessment, recommendations and relevant information discussed. CCM LCSW collaborated with [place], and CCM RN . Update shared with PCP via message.  Follow up Plan: All care plan goals have been met. Will disconnect from care team   Review of patient past medical history, allergies, medications, and health status, including review of pertinent consultant reports was performed as part of comprehensive evaluation and provision of care management/care coordination services.   Care Plan Conditions to be addressed/monitored per PCP order: , Level of care concerns   Care Plan : Clinical Social Work  Updates made by Maurine Cane, LCSW since 05/22/2021 12:00 AM     Problem: Limited support in the home   Priority: High     Long-Range Goal: patient will explore options with the [facility] program for additional support in the home Completed 05/22/2021  Start Date: 06/02/2020  Expected End Date: 05/22/2021  This Visit's Progress: On track  Recent Progress: On track  Priority: High  Note:   Assessment, progress & current barriers:  She would like to go visit [facility] before making a decision, states monthly fee will be approx $400 per  Patient unable to consistently perform activities of daily living needs additional assistance in the home in order to meet this unmet need Wheelchair bound; Limited support at home Interventions  : Assessed needs, level of care concerns, progress and barriers Collaborated with CCM RN for patient's ongoing needs Collaborated with [facility] rep.  [name] [phone] during encounter for update on [facility]  Medicaid completed during home visit  Patient is enrolled in [facility] program Reviewed current appointments with [facility] for continuum of care/ [facility] will collaborate with providers for transition Other interventions provided: Solution-Focused Strategies, Emotional/Supportive Counseling, and Problem Solving Patient Goals/Self-Care Activities Congratulations on starting the [facility] program     [name], [place] / [place]   [phone]

## 2021-06-01 ENCOUNTER — Other Ambulatory Visit: Payer: Self-pay | Admitting: Family Medicine

## 2021-06-01 ENCOUNTER — Ambulatory Visit
Admission: RE | Admit: 2021-06-01 | Discharge: 2021-06-01 | Disposition: A | Discharge status: Home / Self Care | Payer: Medicare (Managed Care) | Source: Ambulatory Visit | Attending: Family Medicine | Admitting: Family Medicine

## 2021-06-01 ENCOUNTER — Encounter (HOSPITAL_COMMUNITY): Payer: Self-pay

## 2021-06-01 DIAGNOSIS — M810 Age-related osteoporosis without current pathological fracture: Secondary | ICD-10-CM

## 2021-06-12 ENCOUNTER — Encounter: Payer: Self-pay | Admitting: Pulmonary Disease

## 2021-06-12 ENCOUNTER — Ambulatory Visit (INDEPENDENT_AMBULATORY_CARE_PROVIDER_SITE_OTHER): Payer: Medicare (Managed Care) | Admitting: Pulmonary Disease

## 2021-06-12 ENCOUNTER — Other Ambulatory Visit: Payer: Self-pay

## 2021-06-12 DIAGNOSIS — M05741 Rheumatoid arthritis with rheumatoid factor of right hand without organ or systems involvement: Secondary | ICD-10-CM | POA: Diagnosis not present

## 2021-06-12 DIAGNOSIS — J849 Interstitial pulmonary disease, unspecified: Secondary | ICD-10-CM

## 2021-06-12 DIAGNOSIS — M05742 Rheumatoid arthritis with rheumatoid factor of left hand without organ or systems involvement: Secondary | ICD-10-CM

## 2021-06-12 NOTE — Assessment & Plan Note (Signed)
Defer to rheumatology regarding need for immunosuppressants but frankly do not think she is a great candidate

## 2021-06-12 NOTE — Assessment & Plan Note (Signed)
HRCT shows mild progression, degree of bronchiectasis is much worse than degree of ILD.  Regardless this seems to be UIP pattern related to rheumatoid arthritis rather than primary IPF. She seems confused about her conversation about immunosuppressants with rheumatology. Regardless I do not think that she is a good candidate for treatment of her lung condition.  I do not think she would tolerate antifibrotic's.  Given her immobility and wheelchair-bound status, I do not think preserving lung function is the most important consideration here.  Currently she is struggling with more important issues including loss of vision and C-spine issues which need to be evaluated. I had a brief conversation about end-of-life care and she desires life support should she get into respiratory trouble
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
## 2021-06-12 NOTE — Patient Instructions (Signed)
We discussed medications for rheumatoid arthritis

## 2021-06-14 ENCOUNTER — Encounter (INDEPENDENT_AMBULATORY_CARE_PROVIDER_SITE_OTHER): Payer: Self-pay | Admitting: Ophthalmology

## 2021-06-14 ENCOUNTER — Ambulatory Visit (INDEPENDENT_AMBULATORY_CARE_PROVIDER_SITE_OTHER): Payer: Medicare (Managed Care) | Admitting: Ophthalmology

## 2021-06-14 ENCOUNTER — Other Ambulatory Visit: Payer: Self-pay

## 2021-06-14 DIAGNOSIS — H353221 Exudative age-related macular degeneration, left eye, with active choroidal neovascularization: Secondary | ICD-10-CM

## 2021-06-14 DIAGNOSIS — Z794 Long term (current) use of insulin: Secondary | ICD-10-CM | POA: Diagnosis not present

## 2021-06-14 DIAGNOSIS — E113553 Type 2 diabetes mellitus with stable proliferative diabetic retinopathy, bilateral: Secondary | ICD-10-CM

## 2021-06-14 MED ORDER — BEVACIZUMAB 2.5 MG/0.1ML IZ SOSY
2.5000 mg | PREFILLED_SYRINGE | INTRAVITREAL | Status: AC | PRN
  Administered 2021-06-14: 2.5 mg via INTRAVITREAL

## 2021-06-14 NOTE — Progress Notes (Signed)
06/14/2021     CHIEF COMPLAINT Patient presents for  Chief Complaint  Patient presents with   Retina Follow Up      HISTORY OF PRESENT ILLNESS: [name] is a 75 y.o. female who presents to the clinic today for:   HPI     Retina Follow Up   Patient presents with  Wet AMD.  In left eye.  This started 6 weeks ago.  Duration of 6 weeks.  Since onset it is stable.        Comments   6 week f/u OS with OCT and possible Avastin injection        Last edited by Reather Littler, COA on 06/14/2021 10:39 AM.      Referring physician: [name], [address] [place],  [place] [address]  HISTORICAL INFORMATION:   Selected notes from the MEDICAL RECORD NUMBER    Lab Results  Component Value Date   HGBA1C 6.2 04/19/2021     CURRENT MEDICATIONS: No current outpatient medications on file. (Ophthalmic Drugs)   No current facility-administered medications for this visit. (Ophthalmic Drugs)   Current Outpatient Medications (Other)  Medication Sig   acetaminophen (TYLENOL) 325 MG tablet Take 2 tablets (650 mg total) by mouth every 6 (six) hours.   amLODipine (NORVASC) 10 MG tablet Take 1 tablet (10 mg total) by mouth at bedtime.   aspirin EC 81 MG tablet Take 1 tablet (81 mg total) by mouth daily. Swallow whole.   ferrous sulfate 324 (65 Fe) MG TBEC TAKE 1 TABLET BY MOUTH EVERY DAY WITH BREAKFAST   furosemide (LASIX) 40 MG tablet TAKE 1 TABLET BY MOUTH EVERY DAY   gabapentin (NEURONTIN) 100 MG capsule TAKE 1 CAPSULE (100 MG TOTAL) BY MOUTH EVERY 8 (EIGHT) HOURS   Incontinence Supply Disposable (INCONTINENCE BRIEF MEDIUM) MISC Wear as needed for incontinence   levothyroxine (SYNTHROID) 150 MCG tablet TAKE 1 TABLET BY MOUTH EVERY DAY   lidocaine (LIDODERM) 5 % Apply one patch to each lower limbs for 12 hours a day as needed for phantom pain. Remove after 12 hours or as directed by MD   Misc. Devices MISC Mepilex silver sulfate-foam bandage 6x6"  change dressing weekly   PROAIR HFA 108 (90 Base) MCG/ACT inhaler INHALE 2 PUFFS INTO THE LUNGS EVERY 6 HOURS AS NEEDED FOR WHEEZING OR SHORTNESS OF BREATH   rosuvastatin (CRESTOR) 20 MG tablet Take 1 tablet (20 mg total) by mouth daily.   No current facility-administered medications for this visit. (Other)      REVIEW OF SYSTEMS:    ALLERGIES Allergies  Allergen Reactions   Feraheme [Ferumoxytol] Itching   Rosiglitazone Maleate Other (See Comments)    REACTION: Difficulty walking, Fatigue, shortness of breath    PAST MEDICAL HISTORY Past Medical History:  Diagnosis Date   Bilateral lower extremity edema 06/06/2014   Cataracts, bilateral 02/13/2011   Seen on eye exam 02/07/11. F/u in 12 months    Diabetes mellitus age 30   Diabetic ulcer of toe ([place]) 12/12/2015   Hyperlipidemia    Hypertension    Retinal detachment, old, partial    left   Retinopathy due to secondary diabetes mellitus ([place])    L>R, laser 3/07   Schizotypal personality disorder ([place])    Thyroid disease    Weight loss 01/22/2012   Past Surgical History:  Procedure Laterality Date   ABDOMINAL AORTOGRAM W/LOWER EXTREMITY N/A 07/23/2018   Procedure: ABDOMINAL AORTOGRAM W/LOWER EXTREMITY;  Surgeon: Carlis Abbott,  Gwenyth Allegra, MD;  Location: [facility];  Service: Cardiovascular;  Laterality: N/A;   ABOVE KNEE LEG AMPUTATION Bilateral 09/27/2019   AMPUTATION Bilateral 09/27/2019   Procedure: AMPUTATION ABOVE KNEE;  Surgeon: Angelia Mould, MD;  Location: [facility] OR;  Service: Vascular;  Laterality: Bilateral;   BREAST EXCISIONAL BIOPSY Bilateral    BREAST SURGERY     CATARACT EXTRACTION     left    EYE SURGERY     ROTATOR CUFF REPAIR  1990's   left   Thyroid radiation ablation     for Graves Disease   TRANSTHORACIC ECHOCARDIOGRAM      EF55-65%, nml - 12/14/2004    FAMILY HISTORY Family History  Problem Relation Age of Onset   Heart disease Mother    Hypertension Mother    Heart attack Mother     Pulmonary fibrosis Father    Liver disease Sister        transplant, liver   Colon cancer Brother    Liver cancer Brother    Colon cancer Maternal Grandmother    Diabetes Daughter    Breast cancer Niece     SOCIAL HISTORY Social History   Tobacco Use   Smoking status: Never   Smokeless tobacco: Never  Vaping Use   Vaping Use: Never used  Substance Use Topics   Alcohol use: No   Drug use: No         OPHTHALMIC EXAM:  Base Eye Exam     Visual Acuity (ETDRS)       Right Left   Dist [place] 20/50 -2 20/30 -2   Dist ph [place] NI NI         Tonometry (Tonopen, 10:49 AM)       Right Left   Pressure 20 20         Pupils       Pupils Dark Shape React APD   Right PERRL 4 Round Sluggish None   Left PERRL 4 Round Sluggish None         Visual Fields (Counting fingers)       Left Right    Full Full         Extraocular Movement       Right Left    Full, Ortho Full, Ortho         Neuro/Psych     Oriented x3: Yes   Mood/Affect: Normal         Dilation     Left eye: 1.0% Mydriacyl, 2.5% Phenylephrine @ 10:49 AM           Slit Lamp and Fundus Exam     External Exam       Right Left   External Normal Normal         Slit Lamp Exam       Right Left   Lids/Lashes Normal Normal   Conjunctiva/Sclera White and quiet White and quiet   Cornea Clear Clear   Anterior Chamber Deep and quiet Deep and quiet   Iris Round and reactive Round and reactive   Lens Open posterior capsule, Centered posterior chamber intraocular lens Open posterior capsule, Centered posterior chamber intraocular lens   Anterior Vitreous Normal Normal         Fundus Exam       Right Left   Posterior Vitreous Normal Normal   Disc Normal Old fibrous proliferations on the nerve, stable   C/D Ratio 0.5 0.5   Macula Focal laser scars Focal  laser scars,,,,new onset Sub RPE subfoveal hemorrhage 1 disc area size subfoveal   Vessels PDR-quiet PDR-quiet   Periphery Laser  scars, good PRP, no active N/V Laser scars, good PRP, no active N/V            IMAGING AND PROCEDURES  Imaging and Procedures for 06/14/21  Intravitreal Injection, Pharmacologic Agent - OS - Left Eye       Time Out 06/14/2021. 11:21 AM. Confirmed correct patient, procedure, site, and patient consented.   Anesthesia Topical anesthesia was used. Anesthetic medications included Lidocaine 4%.   Procedure Preparation included 5% betadine to ocular surface, 10% betadine to eyelids, Ofloxacin . A 30 gauge needle was used.   Injection: 2.5 mg bevacizumab 2.5 MG/0.1ML   Route: Intravitreal, Site: Left Eye   NDC: [phone], Lot: [id number]   Post-op Post injection exam found visual acuity of at least counting fingers. The patient tolerated the procedure well. There were no complications. The patient received written and verbal post procedure care education. Post injection medications included ocuflox.              ASSESSMENT/PLAN:  Exudative age-related macular degeneration of left eye with active choroidal neovascularization (HCC) New onset peripapillary CNVM with set large subfoveal hemorrhagic pigment epithelial detachment to the center of the fovea, post injection #1 Avastin stable condition and preserved acuity will repeat injection today and follow-up again in 7 weeks     ICD-10-CM   1. Exudative age-related macular degeneration of left eye with active choroidal neovascularization (HCC)  H35.3221 Intravitreal Injection, Pharmacologic Agent - OS - Left Eye    bevacizumab (AVASTIN) SOSY 2.5 mg    2. Controlled type 2 diabetes mellitus with stable proliferative retinopathy of both eyes, with long-term current use of insulin (HCC)  E11.3553 OCT, Retina - OU - Both Eyes   Z79.4       1.  OS with subfoveal sub-RPE hemorrhagic detachment from CNVM yet with preserved acuity post Avastin No. 1 with less thickening.  We will repeat injection today and examination next 7  weeks  2.  3.  Ophthalmic Meds Ordered this visit:  Meds ordered this encounter  Medications   bevacizumab (AVASTIN) SOSY 2.5 mg       Return in about 7 weeks (around 08/02/2021) for OS, AVASTIN OCT.  There are no Patient Instructions on file for this visit.   Explained the diagnoses, plan, and follow up with the patient and they expressed understanding.  Patient expressed understanding of the importance of proper follow up care.   Clent Demark Feleica Fulmore M.D. Diseases & Surgery of the Retina and Vitreous Retina & Diabetic [place] 06/14/21     Abbreviations: M myopia (nearsighted); A astigmatism; H hyperopia (farsighted); P presbyopia; Mrx spectacle prescription;  CTL contact lenses; OD right eye; OS left eye; OU both eyes  XT exotropia; ET esotropia; PEK punctate epithelial keratitis; PEE punctate epithelial erosions; DES dry eye syndrome; MGD meibomian gland dysfunction; ATs artificial tears; PFAT's preservative free artificial tears; [place] nuclear sclerotic cataract; PSC posterior subcapsular cataract; ERM epi-retinal membrane; PVD posterior vitreous detachment; RD retinal detachment; DM diabetes mellitus; DR diabetic retinopathy; NPDR non-proliferative diabetic retinopathy; PDR proliferative diabetic retinopathy; CSME clinically significant macular edema; DME diabetic macular edema; dbh dot blot hemorrhages; CWS cotton wool spot; POAG primary open angle glaucoma; C/D cup-to-disc ratio; HVF humphrey visual field; GVF goldmann visual field; OCT optical coherence tomography; IOP intraocular pressure; BRVO Branch retinal vein occlusion; CRVO central retinal vein occlusion; CRAO central retinal  artery occlusion; BRAO branch retinal artery occlusion; RT retinal tear; SB scleral buckle; PPV pars plana vitrectomy; VH Vitreous hemorrhage; PRP panretinal laser photocoagulation; IVK intravitreal kenalog; VMT vitreomacular traction; MH Macular hole;  NVD neovascularization of the disc; NVE  neovascularization elsewhere; AREDS age related eye disease study; ARMD age related macular degeneration; POAG primary open angle glaucoma; EBMD epithelial/anterior basement membrane dystrophy; ACIOL anterior chamber intraocular lens; IOL intraocular lens; PCIOL posterior chamber intraocular lens; Phaco/IOL phacoemulsification with intraocular lens placement; [place] photorefractive keratectomy; LASIK laser assisted in situ keratomileusis; HTN hypertension; DM diabetes mellitus; COPD chronic obstructive pulmonary disease

## 2021-06-14 NOTE — Assessment & Plan Note (Signed)
New onset peripapillary CNVM with set large subfoveal hemorrhagic pigment epithelial detachment to the center of the fovea, post injection #1 Avastin stable condition and preserved acuity will repeat injection today and follow-up again in 7 weeks

## 2021-07-03 ENCOUNTER — Other Ambulatory Visit: Payer: Self-pay | Admitting: Family Medicine

## 2021-07-04 NOTE — Telephone Encounter (Signed)
My understanding is that patient is now enrolled with [facility], so they have assumed all prescribing of her medications. I am going to decline the rx, asking the pharmacy to contact [facility]  Thanks [name], MD

## 2021-07-18 ENCOUNTER — Ambulatory Visit: Payer: Medicare (Managed Care) | Admitting: Rheumatology

## 2021-08-02 ENCOUNTER — Other Ambulatory Visit: Payer: Self-pay

## 2021-08-02 ENCOUNTER — Encounter (INDEPENDENT_AMBULATORY_CARE_PROVIDER_SITE_OTHER): Payer: Self-pay | Admitting: Ophthalmology

## 2021-08-02 ENCOUNTER — Ambulatory Visit (INDEPENDENT_AMBULATORY_CARE_PROVIDER_SITE_OTHER): Payer: Medicare (Managed Care) | Admitting: Ophthalmology

## 2021-08-02 DIAGNOSIS — E113553 Type 2 diabetes mellitus with stable proliferative diabetic retinopathy, bilateral: Secondary | ICD-10-CM

## 2021-08-02 DIAGNOSIS — Z794 Long term (current) use of insulin: Secondary | ICD-10-CM

## 2021-08-02 DIAGNOSIS — H353221 Exudative age-related macular degeneration, left eye, with active choroidal neovascularization: Secondary | ICD-10-CM | POA: Diagnosis not present

## 2021-08-02 MED ORDER — BEVACIZUMAB 2.5 MG/0.1ML IZ SOSY
2.5000 mg | PREFILLED_SYRINGE | INTRAVITREAL | Status: AC | PRN
  Administered 2021-08-02: 2.5 mg via INTRAVITREAL

## 2021-08-02 NOTE — Progress Notes (Signed)
08/02/2021     CHIEF COMPLAINT Patient presents for  Chief Complaint  Patient presents with   Retina Follow Up      HISTORY OF PRESENT ILLNESS: [name] is a 75 y.o. female who presents to the clinic today for:   HPI     Retina Follow Up           Diagnosis: Wet AMD   Laterality: left eye   Onset: 7 weeks ago   Severity: mild   Duration: 7 weeks   Course: stable         Comments   7 week fu OS oct avastin OS. Patient states vision is stable and unchanged since last visit. Denies any new floaters or FOL.       Last edited by Laurin Coder on 08/02/2021 10:53 AM.      Referring physician: [name], [address] [place],  [place] [address]  HISTORICAL INFORMATION:   Selected notes from the MEDICAL RECORD NUMBER    Lab Results  Component Value Date   HGBA1C 6.2 04/19/2021     CURRENT MEDICATIONS: No current outpatient medications on file. (Ophthalmic Drugs)   No current facility-administered medications for this visit. (Ophthalmic Drugs)   Current Outpatient Medications (Other)  Medication Sig   acetaminophen (TYLENOL) 325 MG tablet Take 2 tablets (650 mg total) by mouth every 6 (six) hours.   amLODipine (NORVASC) 10 MG tablet Take 1 tablet (10 mg total) by mouth at bedtime.   aspirin EC 81 MG tablet Take 1 tablet (81 mg total) by mouth daily. Swallow whole.   ferrous sulfate 324 (65 Fe) MG TBEC TAKE 1 TABLET BY MOUTH EVERY DAY WITH BREAKFAST   furosemide (LASIX) 40 MG tablet TAKE 1 TABLET BY MOUTH EVERY DAY   gabapentin (NEURONTIN) 100 MG capsule TAKE 1 CAPSULE (100 MG TOTAL) BY MOUTH EVERY 8 (EIGHT) HOURS   Incontinence Supply Disposable (INCONTINENCE BRIEF MEDIUM) MISC Wear as needed for incontinence   levothyroxine (SYNTHROID) 150 MCG tablet TAKE 1 TABLET BY MOUTH EVERY DAY   lidocaine (LIDODERM) 5 % Apply one patch to each lower limbs for 12 hours a day as needed for phantom pain. Remove after 12 hours or as  directed by MD   Misc. Devices MISC Mepilex silver sulfate-foam bandage 6x6" change dressing weekly   PROAIR HFA 108 (90 Base) MCG/ACT inhaler INHALE 2 PUFFS INTO THE LUNGS EVERY 6 HOURS AS NEEDED FOR WHEEZING OR SHORTNESS OF BREATH   rosuvastatin (CRESTOR) 20 MG tablet Take 1 tablet (20 mg total) by mouth daily.   No current facility-administered medications for this visit. (Other)      REVIEW OF SYSTEMS:    ALLERGIES Allergies  Allergen Reactions   Feraheme [Ferumoxytol] Itching   Rosiglitazone Maleate Other (See Comments)    REACTION: Difficulty walking, Fatigue, shortness of breath    PAST MEDICAL HISTORY Past Medical History:  Diagnosis Date   Bilateral lower extremity edema 06/06/2014   Cataracts, bilateral 02/13/2011   Seen on eye exam 02/07/11. F/u in 12 months    Diabetes mellitus age 88   Diabetic ulcer of toe ([place]) 12/12/2015   Hyperlipidemia    Hypertension    Retinal detachment, old, partial    left   Retinopathy due to secondary diabetes mellitus ([place])    L>R, laser 3/07   Schizotypal personality disorder ([place])    Thyroid disease    Weight loss 01/22/2012   Past Surgical History:  Procedure  Laterality Date   ABDOMINAL AORTOGRAM W/LOWER EXTREMITY N/A 07/23/2018   Procedure: ABDOMINAL AORTOGRAM W/LOWER EXTREMITY;  Surgeon: Marty Heck, MD;  Location: [facility];  Service: Cardiovascular;  Laterality: N/A;   ABOVE KNEE LEG AMPUTATION Bilateral 09/27/2019   AMPUTATION Bilateral 09/27/2019   Procedure: AMPUTATION ABOVE KNEE;  Surgeon: Angelia Mould, MD;  Location: [facility] OR;  Service: Vascular;  Laterality: Bilateral;   BREAST EXCISIONAL BIOPSY Bilateral    BREAST SURGERY     CATARACT EXTRACTION     left    EYE SURGERY     ROTATOR CUFF REPAIR  1990's   left   Thyroid radiation ablation     for Graves Disease   TRANSTHORACIC ECHOCARDIOGRAM      EF55-65%, nml - 12/14/2004    FAMILY HISTORY Family History  Problem Relation Age of Onset    Heart disease Mother    Hypertension Mother    Heart attack Mother    Pulmonary fibrosis Father    Liver disease Sister        transplant, liver   Colon cancer Brother    Liver cancer Brother    Colon cancer Maternal Grandmother    Diabetes Daughter    Breast cancer Niece     SOCIAL HISTORY Social History   Tobacco Use   Smoking status: Never   Smokeless tobacco: Never  Vaping Use   Vaping Use: Never used  Substance Use Topics   Alcohol use: No   Drug use: No         OPHTHALMIC EXAM:  Base Eye Exam     Visual Acuity (ETDRS)       Right Left   Dist [place] 20/60 -2 20/30 -1   Dist ph [place] NI NI         Tonometry (Tonopen, 10:59 AM)       Right Left   Pressure 22 18         Pupils       Pupils Dark React APD   Right PERRL 4 Sluggish None   Left PERRL 4 Sluggish None         Neuro/Psych     Oriented x3: Yes   Mood/Affect: Normal         Dilation     Left eye: 1.0% Mydriacyl, 2.5% Phenylephrine @ 10:59 AM           Slit Lamp and Fundus Exam     External Exam       Right Left   External Normal Normal         Slit Lamp Exam       Right Left   Lids/Lashes Normal Normal   Conjunctiva/Sclera White and quiet White and quiet   Cornea Clear Clear   Anterior Chamber Deep and quiet Deep and quiet   Iris Round and reactive Round and reactive   Lens Open posterior capsule, Centered posterior chamber intraocular lens Open posterior capsule, Centered posterior chamber intraocular lens   Anterior Vitreous Normal Normal         Fundus Exam       Right Left   Posterior Vitreous  Normal   Disc  Old fibrous proliferations on the nerve, stable   C/D Ratio  0.5   Macula  Focal laser scars, Sub RPE subfoveal hemorrhage 1 disc area size subfoveal, slightly less extensive   Vessels  PDR-quiet   Periphery  Laser scars, good PRP, no active N/V  IMAGING AND PROCEDURES  Imaging and Procedures for 08/02/21  Intravitreal  Injection, Pharmacologic Agent - OS - Left Eye       Time Out 08/02/2021. 11:30 AM. Confirmed correct patient, procedure, site, and patient consented.   Anesthesia Topical anesthesia was used. Anesthetic medications included Lidocaine 4%.   Procedure Preparation included 5% betadine to ocular surface, 10% betadine to eyelids, Ofloxacin . A 30 gauge needle was used.   Injection: 2.5 mg bevacizumab 2.5 MG/0.1ML   Route: Intravitreal, Site: Left Eye   NDC: [phone], Lot: [id number]   Post-op Post injection exam found visual acuity of at least counting fingers. The patient tolerated the procedure well. There were no complications. The patient received written and verbal post procedure care education. Post injection medications included ocuflox.              ASSESSMENT/PLAN:  Exudative age-related macular degeneration of left eye with active choroidal neovascularization ([place]) With deep subretinal hemorrhage in the fovea now 1 disc diameter in size.  Stabilized with stable acuity and notable subfoveal pigment epithelial detachment.  Post Avastin.  Repeat injection OS today     ICD-10-CM   1. Controlled type 2 diabetes mellitus with stable proliferative retinopathy of both eyes, with long-term current use of insulin (HCC)  S50.5397 CANCELED: OCT, Retina - OU - Both Eyes   Z79.4     2. Exudative age-related macular degeneration of left eye with active choroidal neovascularization (HCC)  H35.3221 Intravitreal Injection, Pharmacologic Agent - OS - Left Eye    bevacizumab (AVASTIN) SOSY 2.5 mg    CANCELED: OCT, Retina - OU - Both Eyes      1.  Clinical examination confirms subfoveal RPE hemorrhage from CNVM OS no enlargement, stable acuity.  At 7-week follow-up interval.  2.  Repeat Avastin OS today and examination again next in 8 weeks  3.  Examination and delivery of therapy while patient in wheelchair, hampers ability to obtain imaging of the retina  Ophthalmic Meds Ordered  this visit:  Meds ordered this encounter  Medications   bevacizumab (AVASTIN) SOSY 2.5 mg       Return in about 8 weeks (around 09/27/2021) for dilate, OS, AVASTIN OCT.  There are no Patient Instructions on file for this visit.   Explained the diagnoses, plan, and follow up with the patient and they expressed understanding.  Patient expressed understanding of the importance of proper follow up care.   Clent Demark Eathen Budreau M.D. Diseases & Surgery of the Retina and Vitreous Retina & Diabetic [place] 08/02/21     Abbreviations: M myopia (nearsighted); A astigmatism; H hyperopia (farsighted); P presbyopia; Mrx spectacle prescription;  CTL contact lenses; OD right eye; OS left eye; OU both eyes  XT exotropia; ET esotropia; PEK punctate epithelial keratitis; PEE punctate epithelial erosions; DES dry eye syndrome; MGD meibomian gland dysfunction; ATs artificial tears; PFAT's preservative free artificial tears; [place] nuclear sclerotic cataract; PSC posterior subcapsular cataract; ERM epi-retinal membrane; PVD posterior vitreous detachment; RD retinal detachment; DM diabetes mellitus; DR diabetic retinopathy; NPDR non-proliferative diabetic retinopathy; PDR proliferative diabetic retinopathy; CSME clinically significant macular edema; DME diabetic macular edema; dbh dot blot hemorrhages; CWS cotton wool spot; POAG primary open angle glaucoma; C/D cup-to-disc ratio; HVF humphrey visual field; GVF goldmann visual field; OCT optical coherence tomography; IOP intraocular pressure; BRVO Branch retinal vein occlusion; CRVO central retinal vein occlusion; CRAO central retinal artery occlusion; BRAO branch retinal artery occlusion; RT retinal tear; SB scleral buckle; PPV pars plana vitrectomy; VH Vitreous  hemorrhage; PRP panretinal laser photocoagulation; IVK intravitreal kenalog; VMT vitreomacular traction; MH Macular hole;  NVD neovascularization of the disc; NVE neovascularization elsewhere; AREDS age related eye  disease study; ARMD age related macular degeneration; POAG primary open angle glaucoma; EBMD epithelial/anterior basement membrane dystrophy; ACIOL anterior chamber intraocular lens; IOL intraocular lens; PCIOL posterior chamber intraocular lens; Phaco/IOL phacoemulsification with intraocular lens placement; [employer] photorefractive keratectomy; LASIK laser assisted in situ keratomileusis; HTN hypertension; DM diabetes mellitus; COPD chronic obstructive pulmonary disease

## 2021-08-02 NOTE — Assessment & Plan Note (Addendum)
With deep subretinal hemorrhage in the fovea now 1 disc diameter in size.  Stabilized with stable acuity and notable subfoveal pigment epithelial detachment.  Post Avastin.  Repeat injection OS today

## 2021-09-04 ENCOUNTER — Other Ambulatory Visit: Payer: Self-pay

## 2021-09-04 ENCOUNTER — Emergency Department (HOSPITAL_COMMUNITY)
Admission: EM | Admit: 2021-09-04 | Discharge: 2021-09-04 | Disposition: A | Discharge status: Home / Self Care | Payer: Medicare (Managed Care) | Attending: Emergency Medicine | Admitting: Emergency Medicine

## 2021-09-04 ENCOUNTER — Emergency Department (HOSPITAL_COMMUNITY): Payer: Medicare (Managed Care)

## 2021-09-04 DIAGNOSIS — Z79899 Other long term (current) drug therapy: Secondary | ICD-10-CM | POA: Insufficient documentation

## 2021-09-04 DIAGNOSIS — M542 Cervicalgia: Secondary | ICD-10-CM | POA: Insufficient documentation

## 2021-09-04 DIAGNOSIS — Z7982 Long term (current) use of aspirin: Secondary | ICD-10-CM | POA: Insufficient documentation

## 2021-09-04 MED ORDER — ACETAMINOPHEN 500 MG PO TABS
1000.0000 mg | ORAL_TABLET | Freq: Once | ORAL | Status: AC
  Administered 2021-09-04: 1000 mg via ORAL
  Filled 2021-09-04: qty 2

## 2021-09-04 NOTE — ED Triage Notes (Signed)
BIB [facility] for neck pain starting a few minutes before EMS arrived. Pt's grandson reports patient was in wheelchair with head rest bent too far forward. Left sided neck pain and headache. Pt is bilateral AKA hx diabetes. Alert and Oriented x4.

## 2021-09-04 NOTE — ED Provider Notes (Signed)
[facility] EMERGENCY DEPARTMENT Provider Note   CSN: [id number] Arrival date & time: 09/04/21  0017     History  Chief Complaint  Patient presents with   Neck Injury    Neck pain    [name] is a 76 y.o. female.  The history is provided by the patient and medical records.  Neck Injury  76 year old female presenting to the ED with left-sided neck pain.  Apparently today she was found in her wheelchair with head rest bent too far forward, unsure how long she was in this position.  Began complaining of neck pain and 10 minutes later grandson called EMS.  States she generally takes Tylenol for pain but ran out of this at home today's was not had any medications.  She denies any new falls or trauma from her chair.  Does have history of RA.  Home Medications Prior to Admission medications   Medication Sig Start Date End Date Taking? Authorizing Provider  acetaminophen (TYLENOL) 325 MG tablet Take 2 tablets (650 mg total) by mouth every 6 (six) hours. 10/02/19   Nuala Alpha, MD  amLODipine (NORVASC) 10 MG tablet Take 1 tablet (10 mg total) by mouth at bedtime. 04/19/21   [name], MD  aspirin EC 81 MG tablet Take 1 tablet (81 mg total) by mouth daily. Swallow whole. 04/19/21   [name], MD  ferrous sulfate 324 (65 Fe) MG TBEC TAKE 1 TABLET BY MOUTH EVERY DAY WITH BREAKFAST 05/08/21   [name], MD  furosemide (LASIX) 40 MG tablet TAKE 1 TABLET BY MOUTH EVERY DAY 05/08/21   [name], MD  gabapentin (NEURONTIN) 100 MG capsule TAKE 1 CAPSULE (100 MG TOTAL) BY MOUTH EVERY 8 (EIGHT) HOURS 05/08/21   [name], MD  Incontinence Supply Disposable (INCONTINENCE BRIEF MEDIUM) MISC Wear as needed for incontinence 03/23/20   [name], MD  levothyroxine (SYNTHROID) 150 MCG tablet TAKE 1 TABLET BY MOUTH EVERY DAY 04/04/21   [name], MD  lidocaine (LIDODERM) 5 % Apply one patch to each lower limbs for 12  hours a day as needed for phantom pain. Remove after 12 hours or as directed by MD 01/25/20   Kinnie Feil, MD  Misc. Devices MISC Mepilex silver sulfate-foam bandage 6x6" change dressing weekly 12/15/20   [name], MD  PROAIR HFA 108 [phone] Base) MCG/ACT inhaler INHALE 2 PUFFS INTO THE LUNGS EVERY 6 HOURS AS NEEDED FOR WHEEZING OR SHORTNESS OF BREATH 08/31/18   [name], MD  rosuvastatin (CRESTOR) 20 MG tablet Take 1 tablet (20 mg total) by mouth daily. 12/27/20   [name], MD      Allergies    Feraheme [ferumoxytol] and Rosiglitazone maleate    Review of Systems   Review of Systems  Musculoskeletal:  Positive for neck pain.  All other systems reviewed and are negative.  Physical Exam Updated Vital Signs BP (!) 165/85    Pulse 89    Temp 98.2 F (36.8 C)    Resp 17    SpO2 100%   Physical Exam Vitals and nursing note reviewed.  Constitutional:      Appearance: She is well-developed.  HENT:     Head: Normocephalic and atraumatic.  Eyes:     Conjunctiva/sclera: Conjunctivae normal.     Pupils: Pupils are equal, round, and reactive to light.  Cardiovascular:     Rate and Rhythm: Normal rate and regular rhythm.  Heart sounds: Normal heart sounds.  Pulmonary:     Effort: Pulmonary effort is normal.     Breath sounds: Normal breath sounds.  Abdominal:     General: Bowel sounds are normal.     Palpations: Abdomen is soft.  Musculoskeletal:        General: Normal range of motion.     Cervical back: Normal range of motion.     Comments: Neck is held in flexed position with chin nearly on chest, tender along left side without noted deformity, no swelling, no color change of the skin Bilateral AKA's  Skin:    General: Skin is warm and dry.  Neurological:     Mental Status: She is alert and oriented to person, place, and time.     Comments: Awake, alert, oriented, moving arms well, speech is clear and goal oriented    ED Results / Procedures /  Treatments   Labs (all labs ordered are listed, but only abnormal results are displayed) Labs Reviewed - No data to display  EKG None  Radiology CT Cervical Spine Wo Contrast  Result Date: 09/04/2021 CLINICAL DATA:  Initial evaluation for acute left-sided neck pain. EXAM: CT CERVICAL SPINE WITHOUT CONTRAST TECHNIQUE: Multidetector CT imaging of the cervical spine was performed without intravenous contrast. Multiplanar CT image reconstructions were also generated. RADIATION DOSE REDUCTION: This exam was performed according to the departmental dose-optimization program which includes automated exposure control, adjustment of the mA and/or kV according to patient size and/or use of iterative reconstruction technique. COMPARISON:  Prior CT from 05/12/2017. FINDINGS: Alignment: Examination technically limited due to positioning, with the patient's head and neck rotated to the right. Smooth reversal of the normal cervical lordosis. Trace anterolisthesis of C5 on C6, favored to be chronic and degenerative. Skull base and vertebrae: Visualized skull base intact. Occipital condyles grossly intact. There is an osseous defect extending through the left posterior arch of C1 (series 10, image 26). Additionally, there is a fracture defect extending through the left lateral mass of C2 (series 9, image 39). There appears to be involvement of the left transverse process of C2 (series 10, image 28). Associated rotary subluxation of C1 on C2, with the left lateral mass of C1 subluxed laterally relative to C2. These fractures are somewhat age indeterminate, but appear to be new as compared to 2018, and could be acute to subacute in nature. Distally, mild compression deformity noted involving the superior endplate of T3, favored to be chronic. Otherwise, vertebral body height maintained. No other visible acute fracture. No discrete or worrisome osseous lesions. Soft tissues and spinal canal: No definite acute soft tissue  abnormality within the neck, although evaluation limited by positioning. Scattered atheromatous disease noted about the carotid bifurcations. Disc levels: Moderate degenerative intervertebral disc space narrowing present at C5-6. No visible significant spinal stenosis. Upper chest: Visualized upper chest demonstrates no acute finding. Partially visualized lungs are clear. Other: None. IMPRESSION: 1. Technically limited exam due to patient positioning. 2. Osseous/fracture defects involving the left posterior arch of C1 and left lateral mass of C2, with associated impaction of the left lateral mass of C1 on C2 and rotatory subluxation. These fractures are somewhat age indeterminate, but appear to be new as compared to 2018, and could potentially be acute to subacute in nature. The fracture defect at C2 appears to involve the adjacent left transverse foramen, and further evaluation with dedicated CTA to ensure no underlying vascular injury is present suggested as clinically warranted. Additionally, neuro surgical consultation  recommended. 3. Mild compression deformity involving the superior endplate of T3, favored to be chronic. If there is pain at this location, correlation with dedicated MRI could be performed for exact age determination as warranted. 4. Moderate degenerative spondylolysis at C5-6. Critical Value/emergent results were called by telephone at the time of interpretation on 09/04/2021 at 2:15 am to provider [facility] , who verbally acknowledged these results. Electronically Signed   By: Jeannine Boga M.D.   On: 09/04/2021 02:30    Procedures Procedures    Medications Ordered in ED Medications  acetaminophen (TYLENOL) tablet 1,000 mg (1,000 mg Oral Given 09/04/21 0144)    ED Course/ Medical Decision Making/ A&P                           Medical Decision Making  76 y.o. F presenting to the ED with left sided neck pain after headrest on her wheelchair was propped too far forward  flexing her neck.  Unclear how long she was in this position.  On exam, neck is flexed forward with chin nearly on the chest.  She does have some tenderness along left side of neck without appreciable deformity.  She is moving her arms well during my exam.  She has had prior x-rays of the neck without clear definition of her vertebrae due to known RA.  Will get CT cervical spine.  CT ordered and reviewed, results discussed with radiology-- does have fracture fragments of C1 and C2 (somewhat impacted) that are new from 2018 but may actually be subacute.  Recommended neurosurgery consultation, may need MRI.    3:28 AM Discussed with on call neurosurgery who has reviewed images-- does not recommend further imaging/testing as she will not be a surgical candidate so unlikely to change her management.  If she can tolerate aspen collar, can wear that, otherwise will just need to continue to have good neck support in wheelchair.  Patient is stable for discharge home with close OP follow-up with PCP.  Can return here for new concerns.  Final Clinical Impression(s) / ED Diagnoses Final diagnoses:  Neck pain    Rx / DC Orders ED Discharge Orders     None         Larene Pickett, PA-C 09/04/21 0400    Merryl Hacker, MD 09/04/21 [phone]

## 2021-09-04 NOTE — ED Notes (Signed)
Transported to CT 

## 2021-09-04 NOTE — ED Notes (Addendum)
Pt incontinent   Pt was changed

## 2021-09-04 NOTE — Discharge Instructions (Signed)
Wear cervical collar if you can tolerate, if not, try to keep neck supported with neck pillow or similar. Follow-up with your primary care doctor. Return here for new concerns.

## 2021-09-19 ENCOUNTER — Other Ambulatory Visit: Payer: Self-pay

## 2021-09-19 ENCOUNTER — Encounter: Payer: Self-pay | Admitting: Surgery

## 2021-09-19 ENCOUNTER — Ambulatory Visit (INDEPENDENT_AMBULATORY_CARE_PROVIDER_SITE_OTHER): Payer: Medicare (Managed Care) | Admitting: Surgery

## 2021-09-19 VITALS — BP 145/86 | HR 77

## 2021-09-19 DIAGNOSIS — M542 Cervicalgia: Secondary | ICD-10-CM

## 2021-09-19 NOTE — Progress Notes (Signed)
Office Visit Note   Patient: [name]           Date of Birth: 07/17/1946           MRN: [id number] Visit Date: 09/19/2021              Requested by: Janifer Adie, MD [address] [place],  [place] [address] PCP: Janifer Adie, MD   Assessment & Plan: Visit Diagnoses:  1. Bilateral posterior neck pain    Osseous/fracture defects involving the left posterior arch of C1 and left lateral mass of C2, with associated impaction of the left lateral mass of C1 on C2 and rotatory subluxation. These fractures are somewhat age indeterminate, but appear to be new as compared to 2018, and could potentially be acute to subacute in nature. The fracture defect at C2 appears to involve the adjacent left transverse foramen  Mild compression deformity involving the superior endplate of T3, favored to be chronic. If there is pain at this location, correlation with dedicated MRI could be performed for exact age determination as warranted.  Moderate degenerative spondylolysis at C5-6.   Plan: I reviewed CT scan with both of my attendings Dr. Lorin Mercy and Dr. Louanne Skye today.  Dr. Louanne Skye thinks that patient needs neuro surgery referral and this was put in as an urgent referral.  Patient was advised that they should be contacting her hopefully soon to give their recommendations.  I did schedule patient a follow-up appointment with Dr. Lorin Mercy in 3 weeks for recheck but patient also advised to cancel that appointment if neurosurgery has seen her.  All questions answered.  Follow-Up Instructions: Return in about 3 weeks (around 10/10/2021) for with dr yates recheck neck pain.   Orders:  Orders Placed This Encounter  Procedures   Ambulatory referral to Neurosurgery   No orders of the defined types were placed in this encounter.     Procedures: No procedures performed   Clinical Data: No additional findings.   Subjective: Chief Complaint  Patient presents with   Neck - New Patient  (Initial Visit)    HPI 76 year old black female who is new patient clinic comes in today with neck pain.  Patient has had chronic neck pain over the years but states that this may have been somewhat worse over the last few bouts.  No injury.  She was seen in the ED September 04, 2021 and CT cervical spine was ordered.  That study showed:  CLINICAL DATA:  Initial evaluation for acute left-sided neck pain.   EXAM: CT CERVICAL SPINE WITHOUT CONTRAST   TECHNIQUE: Multidetector CT imaging of the cervical spine was performed without intravenous contrast. Multiplanar CT image reconstructions were also generated.   RADIATION DOSE REDUCTION: This exam was performed according to the departmental dose-optimization program which includes automated exposure control, adjustment of the mA and/or kV according to patient size and/or use of iterative reconstruction technique.   COMPARISON:  Prior CT from 05/12/2017.   FINDINGS: Alignment: Examination technically limited due to positioning, with the patient's head and neck rotated to the right.   Smooth reversal of the normal cervical lordosis. Trace anterolisthesis of C5 on C6, favored to be chronic and degenerative.   Skull base and vertebrae: Visualized skull base intact. Occipital condyles grossly intact. There is an osseous defect extending through the left posterior arch of C1 (series 10, image 26). Additionally, there is a fracture defect extending through the left lateral mass of C2 (series 9, image 39). There  appears to be involvement of the left transverse process of C2 (series 10, image 28). Associated rotary subluxation of C1 on C2, with the left lateral mass of C1 subluxed laterally relative to C2. These fractures are somewhat age indeterminate, but appear to be new as compared to 2018, and could be acute to subacute in nature.   Distally, mild compression deformity noted involving the superior endplate of T3, favored to be chronic.  Otherwise, vertebral body height maintained. No other visible acute fracture. No discrete or worrisome osseous lesions.   Soft tissues and spinal canal: No definite acute soft tissue abnormality within the neck, although evaluation limited by positioning. Scattered atheromatous disease noted about the carotid bifurcations.   Disc levels: Moderate degenerative intervertebral disc space narrowing present at C5-6. No visible significant spinal stenosis.   Upper chest: Visualized upper chest demonstrates no acute finding. Partially visualized lungs are clear.   Other: None.   IMPRESSION: 1. Technically limited exam due to patient positioning. 2. Osseous/fracture defects involving the left posterior arch of C1 and left lateral mass of C2, with associated impaction of the left lateral mass of C1 on C2 and rotatory subluxation. These fractures are somewhat age indeterminate, but appear to be new as compared to 2018, and could potentially be acute to subacute in nature. The fracture defect at C2 appears to involve the adjacent left transverse foramen, and further evaluation with dedicated CTA to ensure no underlying vascular injury is present suggested as clinically warranted. Additionally, neuro surgical consultation recommended. 3. Mild compression deformity involving the superior endplate of T3, favored to be chronic. If there is pain at this location, correlation with dedicated MRI could be performed for exact age determination as warranted. 4. Moderate degenerative spondylolysis at C5-6.   Critical Value/emergent results were called by telephone at the time of interpretation on 09/04/2021 at 2:15 am to provider [facility] , who verbally acknowledged these results.     Electronically Signed   By: Jeannine Boga M.D.   On: 09/04/2021 02:30  Patient states that her pain is mostly in the base of her neck with some extension into around her bilateral trapezius muscles.   Nothing into the arms.  Patient is also had bilateral above-the-knee amputations February 2021 by Dr. Deitra Mayo And uses a motorized wheelchair. Review of Systems No current complaints of cardiac pulmonary GI GU issues  Objective: Vital Signs: BP (!) 145/86 (BP Location: Right Arm, Patient Position: Sitting, Cuff Size: Large)    Pulse 77   Physical Exam Pulmonary:     Effort: No respiratory distress.  Musculoskeletal:     Comments: Patient sitting in her wheelchair with her neck flexed chin to chest.  States that she has a hard time picking up her head.  Posterior neck is tender to palpation and this extends to the bilateral trapezius muscles.  Neurological:     Mental Status: She is oriented to person, place, and time.  Psychiatric:        Mood and Affect: Mood normal.    Ortho Exam  Specialty Comments:  No specialty comments available.  Imaging: No results found.   PMFS History: Patient Active Problem List   Diagnosis Date Noted   Exudative age-related macular degeneration of left eye with active choroidal neovascularization ([place]) 05/01/2021   Deep retinal hemorrhage, left 05/01/2021   High risk social situation 04/25/2021   CAD (coronary artery disease) 04/25/2021   Aortic atherosclerosis ([place]) 04/25/2021   Vertebral fracture, osteoporotic ([place]) 01/22/2021  ILD (interstitial lung disease) ([place]) 11/28/2020   Controlled type 2 diabetes mellitus with stable proliferative retinopathy of both eyes, with long-term current use of insulin ([place]) 08/07/2020   Pseudophakia of both eyes 08/07/2020   At high risk for pressure injury of skin 12/16/2019   Uncontrolled type 2 diabetes mellitus with hyperglycemia ([place]) 11/12/2019   S/P AKA (above knee amputation) bilateral ([place]) 11/12/2019   Food insecurity 11/12/2019   Wound infection    Moderate protein-calorie malnutrition ([place])    Left breast mass 06/02/2019   Stress 06/02/2019   Peripheral vascular disease ([place])  07/22/2018   Dyspnea 03/23/2018   Impaired functional mobility, balance, gait, and endurance 03/02/2018    Class: Diagnosis of   Abnormality of gait 02/26/2018   Iron deficiency anemia 03/19/2017   Vision disturbance 03/20/2015   Memory difficulty 03/20/2015   At high risk for falls 11/10/2012   DIABETIC  RETINOPATHY 03/17/2007   Type 2 diabetes mellitus with diabetic neuropathy, unspecified ([place]) 03/17/2007   Hypothyroidism 10/16/2006   Type 2 diabetes mellitus ([place]) 10/16/2006   OBESITY, NOS 10/16/2006   HYPERTENSION, BENIGN SYSTEMIC 10/16/2006   GASTROESOPHAGEAL REFLUX, NO ESOPHAGITIS 10/16/2006   Rheumatoid arthritis ([place]) 10/16/2006   Disorder of bone and cartilage 10/16/2006   INCONTINENCE, URGE 10/16/2006   Past Medical History:  Diagnosis Date   Bilateral lower extremity edema 06/06/2014   Cataracts, bilateral 02/13/2011   Seen on eye exam 02/07/11. F/u in 12 months    Diabetes mellitus age 34   Diabetic ulcer of toe ([place]) 12/12/2015   Hyperlipidemia    Hypertension    Retinal detachment, old, partial    left   Retinopathy due to secondary diabetes mellitus ([place])    L>R, laser 3/07   Schizotypal personality disorder ([place])    Thyroid disease    Weight loss 01/22/2012    Family History  Problem Relation Age of Onset   Heart disease Mother    Hypertension Mother    Heart attack Mother    Pulmonary fibrosis Father    Liver disease Sister        transplant, liver   Colon cancer Brother    Liver cancer Brother    Colon cancer Maternal Grandmother    Diabetes Daughter    Breast cancer Niece     Past Surgical History:  Procedure Laterality Date   ABDOMINAL AORTOGRAM W/LOWER EXTREMITY N/A 07/23/2018   Procedure: ABDOMINAL AORTOGRAM W/LOWER EXTREMITY;  Surgeon: Marty Heck, MD;  Location: [facility];  Service: Cardiovascular;  Laterality: N/A;   ABOVE KNEE LEG AMPUTATION Bilateral 09/27/2019   AMPUTATION Bilateral 09/27/2019   Procedure: AMPUTATION  ABOVE KNEE;  Surgeon: Angelia Mould, MD;  Location: [facility];  Service: Vascular;  Laterality: Bilateral;   BREAST EXCISIONAL BIOPSY Bilateral    BREAST SURGERY     CATARACT EXTRACTION     left    EYE SURGERY     ROTATOR CUFF REPAIR  1990's   left   Thyroid radiation ablation     for Graves Disease   TRANSTHORACIC ECHOCARDIOGRAM      EF55-65%, nml - 12/14/2004   Social History   Occupational History   Occupation: Retired- Immunologist   Tobacco Use   Smoking status: Never   Smokeless tobacco: Never  Vaping Use   Vaping Use: Never used  Substance and Sexual Activity   Alcohol use: No   Drug use: No   Sexual activity: Not Currently

## 2021-09-25 ENCOUNTER — Encounter (INDEPENDENT_AMBULATORY_CARE_PROVIDER_SITE_OTHER): Payer: Medicare (Managed Care) | Admitting: Ophthalmology

## 2021-09-25 ENCOUNTER — Other Ambulatory Visit: Payer: Self-pay

## 2021-09-25 ENCOUNTER — Encounter (INDEPENDENT_AMBULATORY_CARE_PROVIDER_SITE_OTHER): Payer: Self-pay | Admitting: Ophthalmology

## 2021-09-25 ENCOUNTER — Ambulatory Visit (INDEPENDENT_AMBULATORY_CARE_PROVIDER_SITE_OTHER): Payer: Medicare (Managed Care) | Admitting: Ophthalmology

## 2021-09-25 DIAGNOSIS — H353221 Exudative age-related macular degeneration, left eye, with active choroidal neovascularization: Secondary | ICD-10-CM

## 2021-09-25 DIAGNOSIS — Z794 Long term (current) use of insulin: Secondary | ICD-10-CM | POA: Diagnosis not present

## 2021-09-25 DIAGNOSIS — E113553 Type 2 diabetes mellitus with stable proliferative diabetic retinopathy, bilateral: Secondary | ICD-10-CM | POA: Diagnosis not present

## 2021-09-25 MED ORDER — BEVACIZUMAB 2.5 MG/0.1ML IZ SOSY
2.5000 mg | PREFILLED_SYRINGE | INTRAVITREAL | Status: AC | PRN
  Administered 2021-09-25: 2.5 mg via INTRAVITREAL

## 2021-09-25 NOTE — Assessment & Plan Note (Signed)
Old PDR OS with old fibrous disease, noted significant traction on the macular region good PRP peripherally OS particularly stable not active

## 2021-09-25 NOTE — Progress Notes (Signed)
09/25/2021     CHIEF COMPLAINT Patient presents for  Chief Complaint  Patient presents with   Retina Follow Up      HISTORY OF PRESENT ILLNESS: [name] is a 76 y.o. female who presents to the clinic today for:   History of PDR quiet but also with subfoveal exudative RPE detachment with CNVM OS, currently at 8-week follow-up interval today with preservation of acuity OS HPI     Retina Follow Up           Diagnosis: Diabetic Retinopathy   Laterality: left eye   Onset: 8 weeks ago   Severity: mild   Duration: 8 weeks         Comments   8 weeks dilate OS, Avastin OS, oct. Pt states she notices a pain in the left side of her face like above her left eye where she gets the injection. Pt states vision is the same and has no FOL or floaters.      Last edited by Laurin Coder on 09/25/2021  2:36 PM.      Referring physician: [name], MD [place],  [place] [id number]  HISTORICAL INFORMATION:   Selected notes from the MEDICAL RECORD NUMBER    Lab Results  Component Value Date   HGBA1C 6.2 04/19/2021     CURRENT MEDICATIONS: No current outpatient medications on file. (Ophthalmic Drugs)   No current facility-administered medications for this visit. (Ophthalmic Drugs)   Current Outpatient Medications (Other)  Medication Sig   acetaminophen (TYLENOL) 325 MG tablet Take 2 tablets (650 mg total) by mouth every 6 (six) hours.   amLODipine (NORVASC) 10 MG tablet Take 1 tablet (10 mg total) by mouth at bedtime.   aspirin EC 81 MG tablet Take 1 tablet (81 mg total) by mouth daily. Swallow whole.   ferrous sulfate 324 (65 Fe) MG TBEC TAKE 1 TABLET BY MOUTH EVERY DAY WITH BREAKFAST   furosemide (LASIX) 40 MG tablet TAKE 1 TABLET BY MOUTH EVERY DAY   gabapentin (NEURONTIN) 100 MG capsule TAKE 1 CAPSULE (100 MG TOTAL) BY MOUTH EVERY 8 (EIGHT) HOURS   Incontinence Supply Disposable (INCONTINENCE BRIEF MEDIUM) MISC Wear as needed for  incontinence   levothyroxine (SYNTHROID) 150 MCG tablet TAKE 1 TABLET BY MOUTH EVERY DAY   lidocaine (LIDODERM) 5 % Apply one patch to each lower limbs for 12 hours a day as needed for phantom pain. Remove after 12 hours or as directed by MD   Misc. Devices MISC Mepilex silver sulfate-foam bandage 6x6" change dressing weekly   PROAIR HFA 108 (90 Base) MCG/ACT inhaler INHALE 2 PUFFS INTO THE LUNGS EVERY 6 HOURS AS NEEDED FOR WHEEZING OR SHORTNESS OF BREATH   rosuvastatin (CRESTOR) 20 MG tablet Take 1 tablet (20 mg total) by mouth daily.   No current facility-administered medications for this visit. (Other)      REVIEW OF SYSTEMS:    ALLERGIES Allergies  Allergen Reactions   Feraheme [Ferumoxytol] Itching   Rosiglitazone Maleate Other (See Comments)    REACTION: Difficulty walking, Fatigue, shortness of breath    PAST MEDICAL HISTORY Past Medical History:  Diagnosis Date   Bilateral lower extremity edema 06/06/2014   Cataracts, bilateral 02/13/2011   Seen on eye exam 02/07/11. F/u in 12 months    Diabetes mellitus age 78   Diabetic ulcer of toe ([place]) 12/12/2015   Hyperlipidemia    Hypertension    Retinal detachment, old, partial  left   Retinopathy due to secondary diabetes mellitus ([place])    L>R, laser 3/07   Schizotypal personality disorder ([place])    Thyroid disease    Weight loss 01/22/2012   Past Surgical History:  Procedure Laterality Date   ABDOMINAL AORTOGRAM W/LOWER EXTREMITY N/A 07/23/2018   Procedure: ABDOMINAL AORTOGRAM W/LOWER EXTREMITY;  Surgeon: Marty Heck, MD;  Location: [facility];  Service: Cardiovascular;  Laterality: N/A;   ABOVE KNEE LEG AMPUTATION Bilateral 09/27/2019   AMPUTATION Bilateral 09/27/2019   Procedure: AMPUTATION ABOVE KNEE;  Surgeon: Angelia Mould, MD;  Location: [facility];  Service: Vascular;  Laterality: Bilateral;   BREAST EXCISIONAL BIOPSY Bilateral    BREAST SURGERY     CATARACT EXTRACTION     left    EYE SURGERY      ROTATOR CUFF REPAIR  1990's   left   Thyroid radiation ablation     for Graves Disease   TRANSTHORACIC ECHOCARDIOGRAM      EF55-65%, nml - 12/14/2004    FAMILY HISTORY Family History  Problem Relation Age of Onset   Heart disease Mother    Hypertension Mother    Heart attack Mother    Pulmonary fibrosis Father    Liver disease Sister        transplant, liver   Colon cancer Brother    Liver cancer Brother    Colon cancer Maternal Grandmother    Diabetes Daughter    Breast cancer Niece     SOCIAL HISTORY Social History   Tobacco Use   Smoking status: Never   Smokeless tobacco: Never  Vaping Use   Vaping Use: Never used  Substance Use Topics   Alcohol use: No   Drug use: No         OPHTHALMIC EXAM:  Base Eye Exam     Visual Acuity (ETDRS)       Right Left   Dist [place] 20/70 -1+2 20/30 -1   Dist ph [place] NI          Tonometry (Tonopen, 2:42 PM)       Right Left   Pressure 15 12         Pupils       Dark Light Shape React APD   Right 4 4 Round Sluggish None   Left 4 4 Round Sluggish None         Visual Fields       Left Right   Restrictions Partial outer superior temporal, inferior temporal, superior nasal, inferior nasal deficiencies Partial outer superior temporal, inferior temporal, superior nasal, inferior nasal deficiencies         Extraocular Movement       Right Left    Full Full         Neuro/Psych     Oriented x3: Yes   Mood/Affect: Normal         Dilation     Left eye: 1.0% Mydriacyl, 2.5% Phenylephrine @ 2:42 PM           Slit Lamp and Fundus Exam     External Exam       Right Left   External Normal Normal         Slit Lamp Exam       Right Left   Lids/Lashes Normal Normal   Conjunctiva/Sclera White and quiet White and quiet   Cornea Clear Clear   Anterior Chamber Deep and quiet Deep and quiet   Iris Round and reactive  Round and reactive   Lens Open posterior capsule, Centered posterior  chamber intraocular lens Open posterior capsule, Centered posterior chamber intraocular lens   Anterior Vitreous Normal Normal         Fundus Exam       Right Left   Posterior Vitreous  Normal   Disc  Old fibrous proliferations on the nerve, stable   C/D Ratio  0.5   Macula  Focal laser scars, Sub RPE subfoveal hemorrhage 1 disc area size subfoveal, slightly less extensive, much less redness much less thickened   Vessels  PDR-quiet   Periphery  Laser scars, good PRP, no active N/V            IMAGING AND PROCEDURES  Imaging and Procedures for 09/25/21  Intravitreal Injection, Pharmacologic Agent - OS - Left Eye       Time Out 09/25/2021. 3:18 PM. Confirmed correct patient, procedure, site, and patient consented.   Anesthesia Topical anesthesia was used. Anesthetic medications included Lidocaine 4%.   Procedure Preparation included 5% betadine to ocular surface, 10% betadine to eyelids, Ofloxacin . A 30 gauge needle was used.   Injection: 2.5 mg bevacizumab 2.5 MG/0.1ML   Route: Intravitreal, Site: Left Eye   NDC: [phone], Lot: [id number]   Post-op Post injection exam found visual acuity of at least counting fingers. The patient tolerated the procedure well. There were no complications. The patient received written and verbal post procedure care education. Post injection medications included ocuflox.              ASSESSMENT/PLAN:  Exudative age-related macular degeneration of left eye with active choroidal neovascularization (HCC) Large subfoveal pigment epithelial detachment with associated subretinal subfoveal hemorrhage yet still with good acuity left eye currently at 8-week follow-up today post Avastin.  Patient has noted no interval change in acuity.  We will maintain today and extend interval next examination to 10 weeks  Controlled type 2 diabetes mellitus with stable proliferative retinopathy of both eyes, with long-term current use of insulin  (HCC) Old PDR OS with old fibrous disease, noted significant traction on the macular region good PRP peripherally OS particularly stable not active     ICD-10-CM   1. Controlled type 2 diabetes mellitus with stable proliferative retinopathy of both eyes, with long-term current use of insulin (HCC)  Y78.2956 Intravitreal Injection, Pharmacologic Agent - OS - Left Eye   Z79.4 bevacizumab (AVASTIN) SOSY 2.5 mg    CANCELED: OCT, Retina - OU - Both Eyes    2. Exudative age-related macular degeneration of left eye with active choroidal neovascularization ([place])  H35.3221       1.  No OCT or other visualization can be done because of patient positioning and posturing with kyphosis and being wheelchair-bound.  Nonetheless clinically clearly present that there is much less subretinal fibrosis with preservation acuity post injection Avastin at 8-week interval.  OS  2.  Repeat Avastin today and evaluate OU in 10 weeks  3.  Ophthalmic Meds Ordered this visit:  Meds ordered this encounter  Medications   bevacizumab (AVASTIN) SOSY 2.5 mg       Return in about 10 weeks (around 12/04/2021) for DILATE OU, OS, AVASTIN OCT.  There are no Patient Instructions on file for this visit.   Explained the diagnoses, plan, and follow up with the patient and they expressed understanding.  Patient expressed understanding of the importance of proper follow up care.   Clent Demark Syncere Eble M.D. Diseases & Surgery of the Retina and  Vitreous Retina & Diabetic [place] 09/25/21     Abbreviations: M myopia (nearsighted); A astigmatism; H hyperopia (farsighted); P presbyopia; Mrx spectacle prescription;  CTL contact lenses; OD right eye; OS left eye; OU both eyes  XT exotropia; ET esotropia; PEK punctate epithelial keratitis; PEE punctate epithelial erosions; DES dry eye syndrome; MGD meibomian gland dysfunction; ATs artificial tears; PFAT's preservative free artificial tears; [place] nuclear sclerotic cataract; PSC  posterior subcapsular cataract; ERM epi-retinal membrane; PVD posterior vitreous detachment; RD retinal detachment; DM diabetes mellitus; DR diabetic retinopathy; NPDR non-proliferative diabetic retinopathy; PDR proliferative diabetic retinopathy; CSME clinically significant macular edema; DME diabetic macular edema; dbh dot blot hemorrhages; CWS cotton wool spot; POAG primary open angle glaucoma; C/D cup-to-disc ratio; HVF humphrey visual field; GVF goldmann visual field; OCT optical coherence tomography; IOP intraocular pressure; BRVO Branch retinal vein occlusion; CRVO central retinal vein occlusion; CRAO central retinal artery occlusion; BRAO branch retinal artery occlusion; RT retinal tear; SB scleral buckle; PPV pars plana vitrectomy; VH Vitreous hemorrhage; PRP panretinal laser photocoagulation; IVK intravitreal kenalog; VMT vitreomacular traction; MH Macular hole;  NVD neovascularization of the disc; NVE neovascularization elsewhere; AREDS age related eye disease study; ARMD age related macular degeneration; POAG primary open angle glaucoma; EBMD epithelial/anterior basement membrane dystrophy; ACIOL anterior chamber intraocular lens; IOL intraocular lens; PCIOL posterior chamber intraocular lens; Phaco/IOL phacoemulsification with intraocular lens placement; [place] photorefractive keratectomy; LASIK laser assisted in situ keratomileusis; HTN hypertension; DM diabetes mellitus; COPD chronic obstructive pulmonary disease

## 2021-09-25 NOTE — Assessment & Plan Note (Signed)
Large subfoveal pigment epithelial detachment with associated subretinal subfoveal hemorrhage yet still with good acuity left eye currently at 8-week follow-up today post Avastin.  Patient has noted no interval change in acuity.  We will maintain today and extend interval next examination to 10 weeks

## 2021-09-27 ENCOUNTER — Encounter (INDEPENDENT_AMBULATORY_CARE_PROVIDER_SITE_OTHER): Payer: Medicare (Managed Care) | Admitting: Ophthalmology

## 2021-10-17 ENCOUNTER — Ambulatory Visit: Payer: Medicare (Managed Care) | Admitting: Orthopaedic Surgery

## 2021-11-05 ENCOUNTER — Encounter: Payer: Self-pay | Admitting: *Deleted

## 2021-11-06 ENCOUNTER — Ambulatory Visit (INDEPENDENT_AMBULATORY_CARE_PROVIDER_SITE_OTHER): Payer: Medicare (Managed Care) | Admitting: Orthopaedic Surgery

## 2021-11-06 ENCOUNTER — Encounter: Payer: Self-pay | Admitting: Orthopaedic Surgery

## 2021-11-06 ENCOUNTER — Other Ambulatory Visit: Payer: Self-pay

## 2021-11-06 DIAGNOSIS — Z91198 Patient's noncompliance with other medical treatment and regimen for other reason: Secondary | ICD-10-CM

## 2021-11-09 DIAGNOSIS — Z91198 Patient's noncompliance with other medical treatment and regimen for other reason: Secondary | ICD-10-CM | POA: Insufficient documentation

## 2021-11-09 NOTE — Progress Notes (Signed)
Cancelled.  

## 2021-12-04 ENCOUNTER — Encounter (INDEPENDENT_AMBULATORY_CARE_PROVIDER_SITE_OTHER): Payer: Medicare (Managed Care) | Admitting: Ophthalmology

## 2021-12-10 ENCOUNTER — Encounter (INDEPENDENT_AMBULATORY_CARE_PROVIDER_SITE_OTHER): Payer: Medicare (Managed Care) | Admitting: Ophthalmology

## 2021-12-11 ENCOUNTER — Encounter: Payer: Self-pay | Admitting: Primary Care

## 2021-12-11 ENCOUNTER — Ambulatory Visit (INDEPENDENT_AMBULATORY_CARE_PROVIDER_SITE_OTHER): Payer: Medicare (Managed Care) | Admitting: Primary Care

## 2021-12-11 DIAGNOSIS — J849 Interstitial pulmonary disease, unspecified: Secondary | ICD-10-CM

## 2021-12-11 MED ORDER — FLUTICASONE PROPIONATE 50 MCG/ACT NA SUSP: 1.0000 | Freq: Every day | NASAL | 2 refills | Status: DC

## 2021-12-11 MED ORDER — GUAIFENESIN-DM 100-10 MG/5ML PO SYRP: 5.0000 mL | ORAL_SOLUTION | ORAL | 0 refills | Status: DC | PRN

## 2021-12-11 MED ORDER — DOXYCYCLINE HYCLATE 100 MG PO TABS: 100.0000 mg | ORAL_TABLET | Freq: Two times a day (BID) | ORAL | 0 refills | Status: DC

## 2021-12-11 NOTE — Patient Instructions (Addendum)
We do not recommend medication for lung fibrosis, anti-fibrotic's are generally poorly tolerated and would likely diminish your quality of life. We will continue to follow you in office and if respiratory symptoms worsen we will repeat any necessary testing  ? ?Recommendations: ?Take Doxycycline which is an antibiotic for acute bronchitis ?Take robitussin every 4-6 hours as needed for cough  ?Start Flonase nasal spray as needed for post nasal drip ?Please discuss immunosuppressant medication with rheumatology  ? ?Follow-up: ?4-6 months with [name] (or his first available)  ?
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
## 2021-12-18 NOTE — Progress Notes (Signed)
? ?Office Visit Note ? ?Patient: [name]             ?Date of Birth: 29-Nov-1945           ?MRN: [id number]             ?PCP: Janifer Adie, MD ?Referring: Janifer Adie, MD ?Visit Date: 01/01/2022 ?Occupation: '@GUAROCC'$ @ ? ?Subjective:  ?Pain in joints ? ?History of Present Illness: [name] is a 76 y.o. female with history of seropositive rheumatoid arthritis and interstitial lung disease.  She returns today after her initial visit in September 2022.  We had a detailed discussion during that visit about the immunosuppressive agents and she opted not to go on those medications due to the immunosuppression and increased risk of infection.  She was also evaluated by Dr. Elsworth Soho for ILD and he felt that the patient would not be able to tolerate the antifibrotic medications. ? ?Activities of Daily Living:  ?Patient reports morning stiffness for a few minutes.   ?Patient Denies nocturnal pain.  ?Difficulty dressing/grooming: Reports ?Difficulty climbing stairs: Reports ?Difficulty getting out of chair: Reports ?Difficulty using hands for taps, buttons, cutlery, and/or writing: Reports ? ?Review of Systems  ?Constitutional:  Negative for fatigue.  ?HENT:  Negative for mouth sores, mouth dryness and nose dryness.   ?Eyes:  Negative for pain, itching and dryness.  ?Respiratory:  Positive for shortness of breath. Negative for difficulty breathing.   ?Cardiovascular:  Negative for chest pain and palpitations.  ?Gastrointestinal:  Negative for blood in stool, constipation and diarrhea.  ?Endocrine: Positive for increased urination.  ?Genitourinary:  Negative for difficulty urinating.  ?Musculoskeletal:  Positive for joint pain, joint pain, joint swelling, myalgias, morning stiffness, muscle tenderness and myalgias.  ?Skin:  Negative for color change, rash and redness.  ?Allergic/Immunologic: Negative for susceptible to infections.  ?Neurological:  Positive for headaches. Negative for dizziness, numbness, memory  loss and weakness.  ?Hematological:  Negative for bruising/bleeding tendency.  ?Psychiatric/Behavioral:  Negative for confusion.   ? ?PMFS History:  ?Patient Active Problem List  ? Diagnosis Date Noted  ? Failure to attend appointment with reason given 11/09/2021  ? Exudative age-related macular degeneration of left eye with active choroidal neovascularization ([place]) 05/01/2021  ? Deep retinal hemorrhage, left 05/01/2021  ? High risk social situation 04/25/2021  ? CAD (coronary artery disease) 04/25/2021  ? Aortic atherosclerosis ([place]) 04/25/2021  ? Vertebral fracture, osteoporotic ([place]) 01/22/2021  ? ILD (interstitial lung disease) ([place]) 11/28/2020  ? Controlled type 2 diabetes mellitus with stable proliferative retinopathy of both eyes, with long-term current use of insulin ([place]) 08/07/2020  ? Pseudophakia of both eyes 08/07/2020  ? At high risk for pressure injury of skin 12/16/2019  ? Uncontrolled type 2 diabetes mellitus with hyperglycemia ([place]) 11/12/2019  ? S/P AKA (above knee amputation) bilateral ([place]) 11/12/2019  ? Food insecurity 11/12/2019  ? Wound infection   ? Moderate protein-calorie malnutrition ([place])   ? Left breast mass 06/02/2019  ? Stress 06/02/2019  ? Peripheral vascular disease ([place]) 07/22/2018  ? Dyspnea 03/23/2018  ? Impaired functional mobility, balance, gait, and endurance 03/02/2018  ?  Class: Diagnosis of  ? Abnormality of gait 02/26/2018  ? Iron deficiency anemia 03/19/2017  ? Vision disturbance 03/20/2015  ? Memory difficulty 03/20/2015  ? At high risk for falls 11/10/2012  ? DIABETIC  RETINOPATHY 03/17/2007  ? Type 2 diabetes mellitus with diabetic neuropathy, unspecified ([place]) 03/17/2007  ? Hypothyroidism 10/16/2006  ? Type 2 diabetes mellitus (  [place]) 10/16/2006  ? OBESITY, NOS 10/16/2006  ? HYPERTENSION, BENIGN SYSTEMIC 10/16/2006  ? GASTROESOPHAGEAL REFLUX, NO ESOPHAGITIS 10/16/2006  ? Rheumatoid arthritis ([place]) 10/16/2006  ? Disorder of bone and cartilage 10/16/2006  ? INCONTINENCE,  URGE 10/16/2006  ?  ?Past Medical History:  ?Diagnosis Date  ? Bilateral lower extremity edema 06/06/2014  ? Cataracts, bilateral 02/13/2011  ? Seen on eye exam 02/07/11. F/u in 12 months   ? Diabetes mellitus age 72  ? Diabetic ulcer of toe ([place]) 12/12/2015  ? Hyperlipidemia   ? Hypertension   ? Retinal detachment, old, partial   ? left  ? Retinopathy due to secondary diabetes mellitus ([place])   ? L>R, laser 3/07  ? Schizotypal personality disorder ([place])   ? Thyroid disease   ? Weight loss 01/22/2012  ?  ?Family History  ?Problem Relation Age of Onset  ? Heart disease Mother   ? Hypertension Mother   ? Heart attack Mother   ? Pulmonary fibrosis Father   ? Liver disease Sister   ?     transplant, liver  ? Colon cancer Brother   ? Liver cancer Brother   ? Colon cancer Maternal Grandmother   ? Diabetes Daughter   ? Breast cancer Niece   ? ?Past Surgical History:  ?Procedure Laterality Date  ? ABDOMINAL AORTOGRAM W/LOWER EXTREMITY N/A 07/23/2018  ? Procedure: ABDOMINAL AORTOGRAM W/LOWER EXTREMITY;  Surgeon: Marty Heck, MD;  Location: [facility];  Service: Cardiovascular;  Laterality: N/A;  ? ABOVE KNEE LEG AMPUTATION Bilateral 09/27/2019  ? AMPUTATION Bilateral 09/27/2019  ? Procedure: AMPUTATION ABOVE KNEE;  Surgeon: Angelia Mould, MD;  Location: [facility];  Service: Vascular;  Laterality: Bilateral;  ? BREAST EXCISIONAL BIOPSY Bilateral   ? BREAST SURGERY    ? CATARACT EXTRACTION    ? left   ? EYE SURGERY    ? ROTATOR CUFF REPAIR  1990's  ? left  ? Thyroid radiation ablation    ? for Salote Weidmann Disease  ? TRANSTHORACIC ECHOCARDIOGRAM    ?  EF55-65%, nml - 12/14/2004  ? ?Social History  ? ?Social History Narrative  ?   ? Current Social History  06/16/2020  ? Who lives at home: Lives alone in one level home; grandson rotate and stay with her   ? Transportation: taxi. [employer] for medical appointments. [facility]   ? Important Relationships & Pets: "Everybody I meet." No pets 06/16/2020  ? Work / Education:  Retired/ 12 th  grade 03/06/2017  ? Religious / Personal Beliefs: "I believe in God, [place], and the resurrection." 03/06/2017  ? Interests / Fun: crafts, yard work, going to [employer] 03/06/2017  ? Current stressors: furnace and hot water heater is not working ( has not worked in over 2 1/2 years)  ?   ?   ?                                                                                                ? ?Immunization History  ?Administered Date(s) Administered  ? Influenza Split 05/14/2012  ? Influenza Whole 07/10/2007, 05/11/2008, 05/13/2008  ?  Influenza,inj,Quad PF,6+ Mos 06/20/2017, 06/02/2019  ? Influenza-Unspecified 06/30/2018  ? Moderna Sars-Covid-2 Vaccination 10/27/2019, 12/07/2019  ? PFIZER Comirnaty([place])Covid-19 Tri-Sucrose Vaccine 11/28/2020  ? Pneumococcal Conjugate-13 01/14/2017  ? Pneumococcal Polysaccharide-23 07/19/1994, 02/17/2018  ? Td 10/22/2004  ? Tdap 02/24/2018, 12/10/2020  ?  ? ?Objective: ?Vital Signs: BP 115/69 (BP Location: Right Arm, Patient Position: Sitting, Cuff Size: Normal)   Pulse 75   ? ?Physical Exam ?Vitals and nursing note reviewed.  ?Constitutional:   ?   Appearance: She is well-developed.  ?HENT:  ?   Head: Normocephalic and atraumatic.  ?Eyes:  ?   Conjunctiva/sclera: Conjunctivae normal.  ?Cardiovascular:  ?   Rate and Rhythm: Normal rate and regular rhythm.  ?   Heart sounds: Normal heart sounds.  ?Pulmonary:  ?   Effort: Pulmonary effort is normal.  ?   Breath sounds: Normal breath sounds.  ?Abdominal:  ?   General: Bowel sounds are normal.  ?   Palpations: Abdomen is soft.  ?Musculoskeletal:  ?   Cervical back: Normal range of motion.  ?Lymphadenopathy:  ?   Cervical: No cervical adenopathy.  ?Skin: ?   General: Skin is warm and dry.  ?   Capillary Refill: Capillary refill takes less than 2 seconds.  ?Neurological:  ?   Mental Status: She is alert and oriented to person, place, and time.  ?Psychiatric:     ?   Behavior: Behavior normal.  ?  ? ?Musculoskeletal Exam: Patient is  wheelchair-bound due to bilateral AKA.  She had limited range of motion of her cervical spine.  Shoulder joint abduction was about 100 degrees bilaterally.  Elbow joints with good range of motion with

## 2021-12-19 ENCOUNTER — Ambulatory Visit (INDEPENDENT_AMBULATORY_CARE_PROVIDER_SITE_OTHER): Payer: Medicare (Managed Care) | Admitting: Ophthalmology

## 2021-12-19 ENCOUNTER — Encounter (INDEPENDENT_AMBULATORY_CARE_PROVIDER_SITE_OTHER): Payer: Self-pay | Admitting: Ophthalmology

## 2021-12-19 DIAGNOSIS — H353221 Exudative age-related macular degeneration, left eye, with active choroidal neovascularization: Secondary | ICD-10-CM

## 2021-12-19 DIAGNOSIS — E113553 Type 2 diabetes mellitus with stable proliferative diabetic retinopathy, bilateral: Secondary | ICD-10-CM | POA: Diagnosis not present

## 2021-12-19 DIAGNOSIS — Z794 Long term (current) use of insulin: Secondary | ICD-10-CM

## 2021-12-19 MED ORDER — BEVACIZUMAB 2.5 MG/0.1ML IZ SOSY
2.5000 mg | PREFILLED_SYRINGE | INTRAVITREAL | Status: AC | PRN
  Administered 2021-12-19: 2.5 mg via INTRAVITREAL

## 2021-12-19 NOTE — Assessment & Plan Note (Signed)
Stable OU no signs of active  PDR ?

## 2021-12-19 NOTE — Progress Notes (Signed)
? ? ?12/19/2021 ? ?  ? ?CHIEF COMPLAINT ?Patient presents for  ?Chief Complaint  ?Patient presents with  ? Retina Follow Up  ? ? ? ? ?HISTORY OF PRESENT ILLNESS: ?[name] is a 76 y.o. female who presents to the clinic today for:  ? ?HPI   ? ? Retina Follow Up   ? ?      ? Diagnosis: Diabetic Retinopathy  ? Laterality: both eyes  ? Severity: moderate  ? Course: stable  ? ?  ?  ? ? Comments   ?12 weeks for DILATE OU, AVASTIN OCT. ?Pt stated no changes in vision. ?Pt denies floaters and FOL. ? ? ?  ?  ?Last edited by Silvestre Moment on 12/19/2021  9:14 AM.  ?  ? ? ?Referring physician: ?Janifer Adie, MD ?[address] ?[place],  [place] [id number] ? ?HISTORICAL INFORMATION:  ? ?Selected notes from the [facility] ?  ? ?Lab Results  ?Component Value Date  ? HGBA1C 6.2 04/19/2021  ?  ? ?CURRENT MEDICATIONS: ?No current outpatient medications on file. (Ophthalmic Drugs)  ? ?No current facility-administered medications for this visit. (Ophthalmic Drugs)  ? ?Current Outpatient Medications (Other)  ?Medication Sig  ? acetaminophen (TYLENOL) 325 MG tablet Take 2 tablets (650 mg total) by mouth every 6 (six) hours.  ? amLODipine (NORVASC) 10 MG tablet Take 1 tablet (10 mg total) by mouth at bedtime.  ? aspirin EC 81 MG tablet Take 1 tablet (81 mg total) by mouth daily. Swallow whole.  ? doxycycline (VIBRA-TABS) 100 MG tablet Take 1 tablet (100 mg total) by mouth 2 (two) times daily.  ? ferrous sulfate 324 (65 Fe) MG TBEC TAKE 1 TABLET BY MOUTH EVERY DAY WITH BREAKFAST  ? fluticasone (FLONASE) 50 MCG/ACT nasal spray Place 1 spray into both nostrils daily.  ? furosemide (LASIX) 40 MG tablet TAKE 1 TABLET BY MOUTH EVERY DAY  ? gabapentin (NEURONTIN) 100 MG capsule TAKE 1 CAPSULE (100 MG TOTAL) BY MOUTH EVERY 8 (EIGHT) HOURS  ? GNP VITAMIN D MAXIMUM STRENGTH 50 MCG (2000 UT) TABS Take 1 tablet by mouth daily. (Patient not taking: Reported on 12/11/2021)  ? guaiFENesin-dextromethorphan (ROBITUSSIN DM) 100-10 MG/5ML syrup Take 5 mLs  by mouth every 4 (four) hours as needed for cough.  ? Incontinence Supply Disposable (INCONTINENCE BRIEF MEDIUM) MISC Wear as needed for incontinence  ? levothyroxine (SYNTHROID) 150 MCG tablet TAKE 1 TABLET BY MOUTH EVERY DAY  ? lidocaine (LIDODERM) 5 % Apply one patch to each lower limbs for 12 hours a day as needed for phantom pain. Remove after 12 hours or as directed by MD  ? Misc. Devices MISC Mepilex silver sulfate-foam bandage 6x6" change dressing weekly (Patient not taking: Reported on 12/11/2021)  ? PROAIR HFA 108 (90 Base) MCG/ACT inhaler INHALE 2 PUFFS INTO THE LUNGS EVERY 6 HOURS AS NEEDED FOR WHEEZING OR SHORTNESS OF BREATH  ? rosuvastatin (CRESTOR) 20 MG tablet Take 1 tablet (20 mg total) by mouth daily.  ? ?No current facility-administered medications for this visit. (Other)  ? ? ? ? ?REVIEW OF SYSTEMS: ?ROS   ?Negative for: Constitutional, Gastrointestinal, Neurological, Skin, Genitourinary, Musculoskeletal, HENT, Endocrine, Cardiovascular, Eyes, Respiratory, Psychiatric, Allergic/Imm, Heme/Lymph ?Last edited by Silvestre Moment on 12/19/2021  9:14 AM.  ?  ? ? ? ?ALLERGIES ?Allergies  ?Allergen Reactions  ? Feraheme [Ferumoxytol] Itching  ? Rosiglitazone Maleate Other (See Comments)  ?  REACTION: Difficulty walking, Fatigue, shortness of breath  ? ? ?PAST MEDICAL HISTORY ?  Past Medical History:  ?Diagnosis Date  ? Bilateral lower extremity edema 06/06/2014  ? Cataracts, bilateral 02/13/2011  ? Seen on eye exam 02/07/11. F/u in 12 months   ? Diabetes mellitus age 58  ? Diabetic ulcer of toe ([place]) 12/12/2015  ? Hyperlipidemia   ? Hypertension   ? Retinal detachment, old, partial   ? left  ? Retinopathy due to secondary diabetes mellitus ([place])   ? L>R, laser 3/07  ? Schizotypal personality disorder ([place])   ? Thyroid disease   ? Weight loss 01/22/2012  ? ?Past Surgical History:  ?Procedure Laterality Date  ? ABDOMINAL AORTOGRAM W/LOWER EXTREMITY N/A 07/23/2018  ? Procedure: ABDOMINAL AORTOGRAM W/LOWER EXTREMITY;  Surgeon:  Marty Heck, MD;  Location: [facility];  Service: Cardiovascular;  Laterality: N/A;  ? ABOVE KNEE LEG AMPUTATION Bilateral 09/27/2019  ? AMPUTATION Bilateral 09/27/2019  ? Procedure: AMPUTATION ABOVE KNEE;  Surgeon: Angelia Mould, MD;  [facility]: [facility];  Service: Vascular;  Laterality: Bilateral;  ? BREAST EXCISIONAL BIOPSY Bilateral   ? BREAST SURGERY    ? CATARACT EXTRACTION    ? left   ? EYE SURGERY    ? ROTATOR CUFF REPAIR  1990's  ? left  ? Thyroid radiation ablation    ? for Graves Disease  ? TRANSTHORACIC ECHOCARDIOGRAM    ?  EF55-65%, nml - 12/14/2004  ? ? ?FAMILY HISTORY ?Family History  ?Problem Relation Age of Onset  ? Heart disease Mother   ? Hypertension Mother   ? Heart attack Mother   ? Pulmonary fibrosis Father   ? Liver disease Sister   ?     transplant, liver  ? Colon cancer Brother   ? Liver cancer Brother   ? Colon cancer Maternal Grandmother   ? Diabetes Daughter   ? Breast cancer Niece   ? ? ?SOCIAL HISTORY ?Social History  ? ?Tobacco Use  ? Smoking status: Never  ? Smokeless tobacco: Never  ?Vaping Use  ? Vaping Use: Never used  ?Substance Use Topics  ? Alcohol use: No  ? Drug use: No  ? ?  ? ?  ? ?OPHTHALMIC EXAM: ? ?Base Eye Exam   ? ? Visual Acuity (ETDRS)   ? ?   Right Left  ? Dist [place] 20/100 -2 20/50 -2  ? Dist ph [place] NI NI  ? ?  ?  ? ? Tonometry (Tonopen, 9:23 AM)   ? ?   Right Left  ? Pressure 14 15  ? ?  ?  ? ? Pupils   ? ?   Pupils APD  ? Right PERRL None  ? Left PERRL None  ? ?  ?  ? ? Visual Fields   ? ?   Left Right  ? Restrictions Partial outer superior temporal, inferior temporal, superior nasal, inferior nasal deficiencies Partial outer superior temporal, inferior temporal, superior nasal, inferior nasal deficiencies  ? ?  ?  ? ? Neuro/Psych   ? ? Oriented x3: Yes  ? Mood/Affect: Normal  ? ?  ?  ? ? Dilation   ? ? Both eyes: 1.0% Mydriacyl, 2.5% Phenylephrine @ 9:23 AM  ? ?  ?  ? ?  ? ?Slit Lamp and Fundus Exam   ? ? External Exam   ? ?   Right Left  ?  External Normal Normal  ? ?  ?  ? ? Slit Lamp Exam   ? ?   Right Left  ? Lids/Lashes Normal Normal  ?  Conjunctiva/Sclera White and quiet White and quiet  ? Cornea Clear Clear  ? Anterior Chamber Deep and quiet Deep and quiet  ? Iris Round and reactive Round and reactive  ? Lens Open posterior capsule, Centered posterior chamber intraocular lens Open posterior capsule, Centered posterior chamber intraocular lens  ? Anterior Vitreous Normal Normal  ? ?  ?  ? ? Fundus Exam   ? ?   Right Left  ? Posterior Vitreous Normal Normal  ? Disc Normal Old fibrous proliferations on the nerve, stable  ? C/D Ratio 0.5 0.5  ? Macula Focal laser scars Focal laser scars, Sub RPE subfoveal hemorrhage 1 disc area size subfoveal, slightly less extensive, much less redness much less thickened  ? Vessels PDR-quiet PDR-quiet  ? Periphery Laser scars, good PRP, no active N/V Laser scars, good PRP, no active N/V  ? ?  ?  ? ?  ? ? ?IMAGING AND PROCEDURES  ?Imaging and Procedures for 12/19/21 ? ?OCT, Retina - OU - Both Eyes   ? ?   ?Right Eye ?Quality was good. Scan locations included subfoveal. Central Foveal Thickness: 164. Progression has been stable.  ? ?Left Eye ?Quality was poor. Central Foveal Thickness: 180. Progression has improved. Findings include abnormal foveal contour.  ? ?Notes ?OD, diffuse macular atrophy temporally, no active CSME, stable will observe ? ?Prior large subfoveal pigment epithelial detachment at onset of therapy May 01, 2021, and even after follow-up from 06-14-2021.  Large subfoveal PED completely resolved on antivegF therapy with preservation of acuity.  And maintained interval of 3 months currently. ? ? ?Repeat injection today and this monocular patient in order to preserve ?Romie Minus ? ? ?  ? ?Intravitreal Injection, Pharmacologic Agent - OS - Left Eye   ? ?   ?Time Out ?12/19/2021. 10:17 AM. Confirmed correct patient, procedure, site, and patient consented.  ? ?Anesthesia ?Topical anesthesia was used.  Anesthetic medications included Lidocaine 4%.  ? ?Procedure ?Preparation included 5% betadine to ocular surface, 10% betadine to eyelids, Ofloxacin . A 30 gauge needle was used.  ? ?Injection: ?2.5 mg bevacizumab

## 2021-12-19 NOTE — Assessment & Plan Note (Signed)
Large subfoveal lesion with PED likely peripapillary CNVM with PED subfoveal onset September 2022, remarkably at 3 months post most recent injection February 2023, the lesion has completely resolved as compared to findings of 05-2021 ? ?At 12-week interval today repeat injection today to maintain this monocular patient's acuity reevaluate again in 13 to 14 weeks ?

## 2022-01-01 ENCOUNTER — Encounter: Payer: Self-pay | Admitting: Rheumatology

## 2022-01-01 ENCOUNTER — Ambulatory Visit (INDEPENDENT_AMBULATORY_CARE_PROVIDER_SITE_OTHER): Payer: Medicare (Managed Care) | Admitting: Rheumatology

## 2022-01-01 VITALS — BP 115/69 | HR 75

## 2022-01-01 DIAGNOSIS — M79641 Pain in right hand: Secondary | ICD-10-CM

## 2022-01-01 DIAGNOSIS — J84112 Idiopathic pulmonary fibrosis: Secondary | ICD-10-CM

## 2022-01-01 DIAGNOSIS — I739 Peripheral vascular disease, unspecified: Secondary | ICD-10-CM

## 2022-01-01 DIAGNOSIS — M069 Rheumatoid arthritis, unspecified: Secondary | ICD-10-CM | POA: Diagnosis not present

## 2022-01-01 DIAGNOSIS — I10 Essential (primary) hypertension: Secondary | ICD-10-CM

## 2022-01-01 DIAGNOSIS — Z89611 Acquired absence of right leg above knee: Secondary | ICD-10-CM

## 2022-01-01 DIAGNOSIS — Z79899 Other long term (current) drug therapy: Secondary | ICD-10-CM | POA: Diagnosis not present

## 2022-01-01 DIAGNOSIS — M542 Cervicalgia: Secondary | ICD-10-CM | POA: Diagnosis not present

## 2022-01-01 DIAGNOSIS — Z5181 Encounter for therapeutic drug level monitoring: Secondary | ICD-10-CM

## 2022-01-01 DIAGNOSIS — M79642 Pain in left hand: Secondary | ICD-10-CM

## 2022-01-01 DIAGNOSIS — Z89612 Acquired absence of left leg above knee: Secondary | ICD-10-CM

## 2022-01-01 DIAGNOSIS — Z8719 Personal history of other diseases of the digestive system: Secondary | ICD-10-CM

## 2022-01-01 DIAGNOSIS — Z8639 Personal history of other endocrine, nutritional and metabolic disease: Secondary | ICD-10-CM

## 2022-01-01 DIAGNOSIS — Z862 Personal history of diseases of the blood and blood-forming organs and certain disorders involving the immune mechanism: Secondary | ICD-10-CM

## 2022-01-01 NOTE — Progress Notes (Signed)
Pharmacy Note ? ?Subjective: ?Patient presents today to [facility] for follow up office visit. Patient seen by the pharmacist for counseling on azathioprine (Imuran) and minocycline for rheumatoid arthritis. She has ILD and elevated creatinine.  ? ?Objective: ?CMP  ?   ?Component Value Date/Time  ? NA 137 04/19/2021 1223  ? K 4.3 04/19/2021 1223  ? CL 103 04/19/2021 1223  ? CO2 22 04/19/2021 1223  ? GLUCOSE 82 04/19/2021 1223  ? GLUCOSE 110 (H) 12/10/2020 1857  ? BUN 25 04/19/2021 1223  ? CREATININE 1.11 (H) 04/19/2021 1223  ? CREATININE 0.82 07/05/2014 1606  ? CALCIUM 9.5 04/19/2021 1223  ? PROT 9.5 (H) 11/28/2020 3220  ? ALBUMIN 2.9 (L) 11/28/2020 2542  ? AST 10 11/28/2020 0917  ? ALT 10 11/28/2020 0917  ? ALKPHOS 75 11/28/2020 0917  ? BILITOT 0.3 11/28/2020 0917  ? GFRNONAA 55 (L) 12/10/2020 1857  ? GFRAA 75 11/18/2019 1353  ? ? ?CBC ?   ?Component Value Date/Time  ? WBC 5.0 04/19/2021 1223  ? WBC 4.8 12/10/2020 1857  ? RBC 3.26 (L) 04/19/2021 1223  ? RBC 3.28 (L) 12/10/2020 1857  ? HGB 9.5 (L) 04/19/2021 1223  ? HCT 28.2 (L) 04/19/2021 1223  ? PLT 335 04/19/2021 1223  ? MCV 87 04/19/2021 1223  ? MCH 29.1 04/19/2021 1223  ? MCH 28.0 12/10/2020 1857  ? MCHC 33.7 04/19/2021 1223  ? MCHC 30.1 12/10/2020 1857  ? RDW 11.9 04/19/2021 1223  ? LYMPHSABS 1.9 12/10/2020 1857  ? LYMPHSABS WILL FOLLOW 06/02/2019 1343  ? MONOABS 0.3 12/10/2020 1857  ? EOSABS 0.2 12/10/2020 1857  ? EOSABS WILL FOLLOW 06/02/2019 1343  ? BASOSABS 0.0 12/10/2020 1857  ? BASOSABS WILL FOLLOW 06/02/2019 1343  ? ? ?Baseline Immunosuppressant Therapy Labs ?TB GOLD ?  ?Hepatitis Panel ?  ?HIV ?No results found for: HIV ?Immunoglobulins ? ?  Latest Ref Rng & Units 01/21/2017  ?  9:40 AM  ?Immunoglobulin Electrophoresis  ?IgG 700 - 1,600 mg/dL 4,198    ?IgM 26 - 217 mg/dL 210    ? ?SPEP ? ?  Latest Ref Rng & Units 11/28/2020  ?  9:17 AM  ?Serum Protein Electrophoresis  ?Total Protein 6.0 - 8.5 g/dL 9.5    ? ?G6PD ?No results found for:  G6PDH ?TPMT ?No results found for: TPMT  ? ?Chest x-ray: 03/06/21 - Mild progression of disease in the lungs, as detailed above, once again categorized as compatible with usual interstitial pneumonia (UIP) per current ATS guidelines. ? ?Assessment/Plan: ?Methotrexate and leflunomide are not good options for patient due to ILD and elevated Scr (for MTX). SSZ and hydroxychloroquine are not good options for pt due to elevated creatinine. She is at increased risk for infections ? ?We reviewed azathioprine and minocycline as treatment options. ? ?Patient was counseled on the purpose, proper use, and adverse effects of azathioprine including risk of infection, nausea, rash, and hair loss. Also informed that medication can cause discoloration of urine, sweat and tears. Reviewed risk of cancer after long term use.  Discussed risk of myelosupression and reviewed importance of frequent lab work to monitor blood counts.  No drug-drug interactions with current med list. Provided patient with educational materials on azathioprine and answered all questions. ? ?We reviewed purpose, proper use, and adverse effects of minocycline. Reviewed that it is not immunosuppressive and will not place her at risk for infections. Reviewed risk for GI side effects and to take medication with food. Reviewed importance of wearing sunscreen  with any time spent directly in sun. She states her porch is shaded and she likes to sit on porch in summer. Reviewed importance of taking medication with food. There is drug interaction with iron pills that she takes in morning. She will need to space ferrous sulfate from minocycline. Reviewd that she should take ferrous sulfate 2 hours before or 4 hours after minocycline. ? ?After review of both medications, patient would like to trial minocycline.   ? ?Her dose of minocycline will be '100mg'$  twice daily. ? ?Patient consented to minocycline.  Will upload consent into the media tab.  ? ?Knox Saliva, PharmD, MPH,  BCPS, CPP ?Clinical Pharmacist (Rheumatology and Pulmonology) ?

## 2022-01-01 NOTE — Patient Instructions (Addendum)
We will call you with labs and let you know when minocycline can be started. ? ?Start minocycline 191m twice daily (take with food) ? ?Take your iron pills 2 hours before or 3 hours after minocycline ? ?Wear sunscreen or protective clothing if you will be going outside in sun ? ? ?Minocycline Capsules or Tablets ?What is this medication? ?MINOCYCLINE (mi noe SYE kleen) treats infections caused by bacteria. It may also be used to treat acne. It belongs to a group of medications called tetracycline antibiotics. It will not treat colds, the flu, or infections caused by viruses. ?This medicine may be used for other purposes; ask your health care provider or pharmacist if you have questions. ?COMMON BRAND NAME(S): Cleeravue-M, Dynacin, Minocin, Minocin Kit, Minocin PAC, Myrac ?What should I tell my care team before I take this medication? ?They need to know if you have any of these conditions: ?Kidney disease ?Liver disease ?An unusual or allergic reaction to minocycline, tetracycline antibiotics, other medications, foods, dyes, or preservatives ?Pregnant or trying to get pregnant ?Breast-feeding ?How should I use this medication? ?Take this medication by mouth with a full glass of water. Follow the directions on the prescription label. You can take it with or without food. If it upsets your stomach, take it with food. Take your medication at regular intervals. Do not take your medication more often than directed. Take all of your medication as directed even if you think you are better. Do not skip doses or stop your medication early. ?Talk to your care team about the use of this medication in children. While this medication may be prescribed for children as young as 8 years for selected conditions, precautions do apply. ?Overdosage: If you think you have taken too much of this medicine contact a poison control center or emergency room at once. ?NOTE: This medicine is only for you. Do not share this medicine with  others. ?What if I miss a dose? ?If you miss a dose, take it as soon as you can. If it is almost time for your next dose, take only that dose. Do not take double or extra doses. ?What may interact with this medication? ?Do not take this medication with any of the following: ?Acitretin ?This medication may also interact with the following: ?Antacids ?Birth control pills ?Certain medications that treat or prevent blood clots like warfarin ?Ergot alkaloids like dihydroergotamine, ergonovine, ergotamine, methylergonovine ?Iron supplements ?Isotretinoin ?Methoxyflurane ?Other antibiotics like penicillin ?This list may not describe all possible interactions. Give your health care provider a list of all the medicines, herbs, non-prescription drugs, or dietary supplements you use. Also tell them if you smoke, drink alcohol, or use illegal drugs. Some items may interact with your medicine. ?What should I watch for while using this medication? ?Tell your care team if your symptoms do not start to get better or if they get worse. ?This medication may cause serious skin reactions. They can happen weeks to months after starting the medication. Contact your care team right away if you notice fevers or flu-like symptoms with a rash. The rash may be red or purple and then turn into blisters or peeling of the skin. Or, you might notice a red rash with swelling of the face, lips or lymph nodes in your neck or under your arms. ?Do not treat diarrhea with over the counter products. Contact your care team if you have diarrhea that lasts more than 2 days or if it is severe and watery. ?This medication can make  you more sensitive to the sun. Keep out of the sun. If you cannot avoid being in the sun, wear protective clothing and use sunscreen. Do not use sun lamps or tanning beds/booths. ?Tell your care team right away if you have any change in your eyesight. ?Birth control pills may not work properly while you are taking this medication.  Talk to your care team about using an extra method of birth control. ?You may get drowsy or dizzy. Do not drive, use machinery, or do anything that needs mental alertness until you know how this medication affects you. Do not stand or sit up quickly, especially if you are an older patient. This reduces the risk of dizzy or fainting spells. ?If you are being treated for a sexually transmitted infection, avoid sexual contact until you have finished your treatment. Your sexual partner may also need treatment. ?What side effects may I notice from receiving this medication? ?Side effects that you should report to your care team as soon as possible: ?Allergic reactions--skin rash, itching, hives, swelling of the face, lips, tongue, or throat ?Increased pressure around the brain--severe headache, blurry vision, change in vision, nausea, vomiting ?Joint pain ?Liver injury--right upper belly pain, loss of appetite, nausea, light-colored stool, dark yellow or brown urine, yellowing skin or eyes, unusual weakness or fatigue ?Pain or trouble swallowing ?Rash, fever, and swollen lymph nodes ?Redness, blistering, peeling, or loosening of the skin, including inside the mouth ?Severe diarrhea, fever ?Unusual vaginal discharge, itching, or odor ?Side effects that usually do not require medical attention (report to your care team if they continue or are bothersome): ?Change in tooth color ?Diarrhea ?Dizziness ?Fatigue ?Headache ?This list may not describe all possible side effects. Call your doctor for medical advice about side effects. You may report side effects to FDA at 1-800-FDA-1088. ?Where should I keep my medication? ?Keep out of the reach of children. ?Store at room temperature between 20 and 25 degrees C (68 and 77 degrees F). Throw away any unused medication after the expiration date. ?NOTE: This sheet is a summary. It may not cover all possible information. If you have questions about this medicine, talk to your doctor,  pharmacist, or health care provider. ?? 2023 Elsevier/Gold Standard (2020-10-13 00:00:00) ? ?

## 2022-01-03 ENCOUNTER — Telehealth: Payer: Self-pay | Admitting: Pharmacist

## 2022-01-03 NOTE — Telephone Encounter (Signed)
Plan was to start patient on minocycline at her office visit on 01/01/22. However based on increase creatinine and patient's increased baseline risk for infections, Dr. Estanislado Pandy recommends low-dose prednisone '5mg'$  daily for maintenance as safest option for patient's rheumatoid arthritis.  PCP can manage prednisone rx per Dr. Estanislado Pandy since she will not be adding any immunosuppressive therapy or minocycline for rheumatoid arthritis management  ATC patient x 2 - phone rings then leads to busy tone  Knox Saliva, PharmD, MPH, BCPS, CPP Clinical Pharmacist (Rheumatology and Pulmonology)

## 2022-01-04 LAB — CBC WITH DIFFERENTIAL/PLATELET
Absolute Monocytes: 340 cells/uL (ref 200–950)
Basophils Absolute: 9 cells/uL (ref 0–200)
Basophils Relative: 0.2 %
Eosinophils Absolute: 99 cells/uL (ref 15–500)
Eosinophils Relative: 2.3 %
HCT: 26 % — ABNORMAL LOW (ref 35.0–45.0)
Hemoglobin: 8 g/dL — ABNORMAL LOW (ref 11.7–15.5)
Lymphs Abs: 1892 cells/uL (ref 850–3900)
MCH: 28.4 pg (ref 27.0–33.0)
MCHC: 30.8 g/dL — ABNORMAL LOW (ref 32.0–36.0)
MCV: 92.2 fL (ref 80.0–100.0)
MPV: 11 fL (ref 7.5–12.5)
Monocytes Relative: 7.9 %
Neutro Abs: 1961 cells/uL (ref 1500–7800)
Neutrophils Relative %: 45.6 %
Platelets: 243 10*3/uL (ref 140–400)
RBC: 2.82 10*6/uL — ABNORMAL LOW (ref 3.80–5.10)
RDW: 12.4 % (ref 11.0–15.0)
Total Lymphocyte: 44 %
WBC: 4.3 10*3/uL (ref 3.8–10.8)

## 2022-01-04 LAB — COMPLETE METABOLIC PANEL WITH GFR
AG Ratio: 0.6 (calc) — ABNORMAL LOW (ref 1.0–2.5)
ALT: 4 U/L — ABNORMAL LOW (ref 6–29)
AST: 8 U/L — ABNORMAL LOW (ref 10–35)
Albumin: 3.1 g/dL — ABNORMAL LOW (ref 3.6–5.1)
Alkaline phosphatase (APISO): 63 U/L (ref 37–153)
BUN/Creatinine Ratio: 30 (calc) — ABNORMAL HIGH (ref 6–22)
BUN: 47 mg/dL — ABNORMAL HIGH (ref 7–25)
CO2: 28 mmol/L (ref 20–32)
Calcium: 9.3 mg/dL (ref 8.6–10.4)
Chloride: 100 mmol/L (ref 98–110)
Creat: 1.55 mg/dL — ABNORMAL HIGH (ref 0.60–1.00)
Globulin: 5.5 g/dL (calc) — ABNORMAL HIGH (ref 1.9–3.7)
Glucose, Bld: 77 mg/dL (ref 65–99)
Potassium: 4.8 mmol/L (ref 3.5–5.3)
Sodium: 137 mmol/L (ref 135–146)
Total Bilirubin: 0.3 mg/dL (ref 0.2–1.2)
Total Protein: 8.6 g/dL — ABNORMAL HIGH (ref 6.1–8.1)
eGFR: 35 mL/min/{1.73_m2} — ABNORMAL LOW (ref >=60)

## 2022-01-04 LAB — SEDIMENTATION RATE: Sed Rate: 11 mm/h (ref 0–30)

## 2022-01-04 LAB — RHEUMATOID FACTOR: Rheumatoid fact SerPl-aCnc: 732 IU/mL — ABNORMAL HIGH (ref <14)

## 2022-01-04 LAB — ANTI-NUCLEAR AB-TITER (ANA TITER): ANA Titer 1: 1:1280 {titer} — ABNORMAL HIGH

## 2022-01-04 LAB — ANA: Anti Nuclear Antibody (ANA): POSITIVE — AB

## 2022-01-04 LAB — MAGNESIUM: Magnesium: 2 mg/dL (ref 1.5–2.5)

## 2022-01-04 LAB — CYCLIC CITRUL PEPTIDE ANTIBODY, IGG: Cyclic Citrullin Peptide Ab: >250 UNITS — ABNORMAL HIGH

## 2022-01-06 NOTE — Progress Notes (Signed)
Please add ENA, C3 and C4 if possible

## 2022-01-07 NOTE — Progress Notes (Signed)
Please advise patient to either come in the office or go a local lab for ENA, C3, C4, beta-2, anticardiolipin, urine protein creatinine ratio.

## 2022-01-08 ENCOUNTER — Other Ambulatory Visit: Payer: Self-pay | Admitting: *Deleted

## 2022-01-08 DIAGNOSIS — I10 Essential (primary) hypertension: Secondary | ICD-10-CM

## 2022-01-08 DIAGNOSIS — Z79899 Other long term (current) drug therapy: Secondary | ICD-10-CM

## 2022-01-08 DIAGNOSIS — M069 Rheumatoid arthritis, unspecified: Secondary | ICD-10-CM

## 2022-01-08 DIAGNOSIS — Z862 Personal history of diseases of the blood and blood-forming organs and certain disorders involving the immune mechanism: Secondary | ICD-10-CM

## 2022-01-08 DIAGNOSIS — J84112 Idiopathic pulmonary fibrosis: Secondary | ICD-10-CM

## 2022-01-08 DIAGNOSIS — Z5181 Encounter for therapeutic drug level monitoring: Secondary | ICD-10-CM

## 2022-01-08 DIAGNOSIS — Z8639 Personal history of other endocrine, nutritional and metabolic disease: Secondary | ICD-10-CM

## 2022-01-08 DIAGNOSIS — I739 Peripheral vascular disease, unspecified: Secondary | ICD-10-CM

## 2022-01-08 NOTE — Telephone Encounter (Signed)
I called patient, patient verbalized understanding, phone number has been updated, message faxed to PCP Dr. Barney Drain.

## 2022-01-14 NOTE — Assessment & Plan Note (Addendum)
-   Would likely not tolerate anti-fibrotics. Not candidate for steroids long term d/t diabetes and osteoporosis. Defer immunosuppressant medication to rheumatology.  - Breathing is currently very stable. No significant dyspnea symptoms. Occasional productive cough with recent episode of wheezing. Sending in RX Doxycycline '100mg'$  BID x 7 days for acute bronchitis symptoms. She is wheelchair bound. She remains active and involved in her community. - We will continue to monitor respiratory symptoms, will repeat testing as necessary   Recommendations: Take Doxycycline which is an antibiotic for acute bronchitis Take robitussin every 4-6 hours as needed for cough  Start Flonase nasal spray as needed for post nasal drip Please discuss immunosuppressant medication with rheumatology   Follow-up: 4-6 months with Dr. Elsworth Soho (or his first available)

## 2022-02-21 NOTE — Progress Notes (Signed)
Office Visit Note  Patient: [name]             Date of Birth: 1946/05/28           MRN: [id number]             PCP: Janifer Adie, MD Referring: Janifer Adie, MD Visit Date: 03/06/2022 Occupation: @GUAROCC @  Subjective:  Discuss lab work   History of Present Illness: [name] is a 76 y.o. female with history of seropositive rheumatoid arthritis, UIP, and positive ANA.  Patient is not currently taking any immunosuppressive agents.  Patient presents today to discuss lab results from 01/01/2022.  She is open to getting updated lab work today at her appointment.  She has been taking Tylenol as needed for pain relief.  She denies any increased joint pain or joint swelling since her last office visit.  Patient reports that she remains under the care of [facility].   Activities of Daily Living:  Patient reports morning stiffness for 0 minutes.   Patient Denies nocturnal pain.  Difficulty dressing/grooming: Denies Difficulty climbing stairs: Reports Difficulty getting out of chair: Reports Difficulty using hands for taps, buttons, cutlery, and/or writing: Reports  Review of Systems  Constitutional:  Negative for fatigue.  HENT:  Negative for mouth sores, mouth dryness and nose dryness.   Eyes:  Negative for pain, visual disturbance and dryness.  Respiratory:  Negative for cough, hemoptysis, shortness of breath and difficulty breathing.   Cardiovascular:  Negative for chest pain, palpitations, hypertension and swelling in legs/feet.  Gastrointestinal:  Positive for diarrhea. Negative for blood in stool and constipation.  Endocrine: Negative for increased urination.  Genitourinary:  Negative for painful urination and involuntary urination.  Musculoskeletal:  Negative for joint pain, joint pain, joint swelling, myalgias, muscle weakness, morning stiffness, muscle tenderness and myalgias.  Skin:  Negative for color change, pallor, rash, hair loss, nodules/bumps, skin tightness,  ulcers and sensitivity to sunlight.  Allergic/Immunologic: Negative for susceptible to infections.  Neurological:  Positive for headaches. Negative for dizziness, numbness and weakness.  Hematological:  Negative for swollen glands.  Psychiatric/Behavioral:  Negative for depressed mood and sleep disturbance. The patient is not nervous/anxious.     PMFS History:  Patient Active Problem List   Diagnosis Date Noted   Failure to attend appointment with reason given 11/09/2021   Exudative age-related macular degeneration of left eye with active choroidal neovascularization ([place]) 05/01/2021   Deep retinal hemorrhage, left 05/01/2021   High risk social situation 04/25/2021   CAD (coronary artery disease) 04/25/2021   Aortic atherosclerosis ([place]) 04/25/2021   Vertebral fracture, osteoporotic ([place]) 01/22/2021   ILD (interstitial lung disease) ([place]) 11/28/2020   Controlled type 2 diabetes mellitus with stable proliferative retinopathy of both eyes, with long-term current use of insulin ([place]) 08/07/2020   Pseudophakia of both eyes 08/07/2020   At high risk for pressure injury of skin 12/16/2019   Uncontrolled type 2 diabetes mellitus with hyperglycemia ([place]) 11/12/2019   S/P AKA (above knee amputation) bilateral ([place]) 11/12/2019   Food insecurity 11/12/2019   Wound infection    Moderate protein-calorie malnutrition ([place])    Left breast mass 06/02/2019   Stress 06/02/2019   Peripheral vascular disease ([place]) 07/22/2018   Dyspnea 03/23/2018   Impaired functional mobility, balance, gait, and endurance 03/02/2018    Class: Diagnosis of   Abnormality of gait 02/26/2018   Iron deficiency anemia 03/19/2017   Vision disturbance 03/20/2015   Memory difficulty 03/20/2015   At  high risk for falls 11/10/2012   DIABETIC  RETINOPATHY 03/17/2007   Type 2 diabetes mellitus with diabetic neuropathy, unspecified ([place]) 03/17/2007   Hypothyroidism 10/16/2006   Type 2 diabetes mellitus ([place]) 10/16/2006    OBESITY, NOS 10/16/2006   HYPERTENSION, BENIGN SYSTEMIC 10/16/2006   GASTROESOPHAGEAL REFLUX, NO ESOPHAGITIS 10/16/2006   Rheumatoid arthritis ([place]) 10/16/2006   Disorder of bone and cartilage 10/16/2006   INCONTINENCE, URGE 10/16/2006    Past Medical History:  Diagnosis Date   Bilateral lower extremity edema 06/06/2014   Cataracts, bilateral 02/13/2011   Seen on eye exam 02/07/11. F/u in 12 months    Diabetes mellitus age 42   Diabetic ulcer of toe ([place]) 12/12/2015   Hyperlipidemia    Hypertension    Retinal detachment, old, partial    left   Retinopathy due to secondary diabetes mellitus ([place])    L>R, laser 3/07   Schizotypal personality disorder ([place])    Thyroid disease    Weight loss 01/22/2012    Family History  Problem Relation Age of Onset   Heart disease Mother    Hypertension Mother    Heart attack Mother    Pulmonary fibrosis Father    Liver disease Sister        transplant, liver   Colon cancer Brother    Liver cancer Brother    Colon cancer Maternal Grandmother    Diabetes Daughter    Breast cancer Niece    Past Surgical History:  Procedure Laterality Date   ABDOMINAL AORTOGRAM W/LOWER EXTREMITY N/A 07/23/2018   Procedure: ABDOMINAL AORTOGRAM W/LOWER EXTREMITY;  Surgeon: Marty Heck, MD;  Location: [facility];  Service: Cardiovascular;  Laterality: N/A;   ABOVE KNEE LEG AMPUTATION Bilateral 09/27/2019   AMPUTATION Bilateral 09/27/2019   Procedure: AMPUTATION ABOVE KNEE;  Surgeon: Angelia Mould, MD;  Location[facility];  Service: Vascular;  Laterality: Bilateral;   BREAST EXCISIONAL BIOPSY Bilateral    BREAST SURGERY     CATARACT EXTRACTION     left    EYE SURGERY     ROTATOR CUFF REPAIR  1990's   left   Thyroid radiation ablation     for Graves Disease   TRANSTHORACIC ECHOCARDIOGRAM      EF55-65%, nml - 12/14/2004   Social History   Social History Narrative      Current Social History  06/16/2020   Who lives at home: Lives alone  in one level home; grandson rotate and stay with her    Transportation: taxi. [employer] for medical appointments. [employer]    Important Relationships & Pets: "Everybody I meet." No pets 06/16/2020   Work / Education:  Retired/ 12 th grade 03/06/2017   Religious / Personal Beliefs: "I believe in God, [place], and the resurrection." 03/06/2017   Interests / Fun: crafts, yard work, going to [employer] 03/06/2017   Current stressors: furnace and hot water heater is not working ( has not worked in over 2 1/2 years)  Immunization History  Administered Date(s) Administered   Influenza Split 05/14/2012   Influenza Whole 07/10/2007, 05/11/2008, 05/13/2008   Influenza,inj,Quad PF,6+ Mos 06/20/2017, 06/02/2019   Influenza-Unspecified 06/30/2018   Moderna Sars-Covid-2 Vaccination 10/27/2019, 12/07/2019   PFIZER Comirnaty(Gray [place])Covid-19 Tri-Sucrose Vaccine 11/28/2020   Pneumococcal Conjugate-13 01/14/2017   Pneumococcal Polysaccharide-23 07/19/1994, 02/17/2018   Td 10/22/2004   Tdap 02/24/2018, 12/10/2020     Objective: Vital Signs: BP 124/72 (BP Location: Right Arm, Patient Position: Sitting, Cuff Size: Normal)   Pulse 80    Physical Exam Vitals and nursing note reviewed.  Constitutional:      Appearance: She is well-developed.  HENT:     Head: Normocephalic and atraumatic.  Eyes:     Conjunctiva/sclera: Conjunctivae normal.  Cardiovascular:     Rate and Rhythm: Normal rate and regular rhythm.     Heart sounds: Normal heart sounds.  Pulmonary:     Effort: Pulmonary effort is normal.     Breath sounds: Normal breath sounds.     Comments: Crackles  Abdominal:     General: Bowel sounds are normal.     Palpations: Abdomen is soft.  Musculoskeletal:     Cervical back: Normal range of motion.  Skin:    General: Skin is warm and dry.     Capillary Refill: Capillary refill takes less  than 2 seconds.  Neurological:     Mental Status: She is alert and oriented to person, place, and time.  Psychiatric:        Behavior: Behavior normal.      Musculoskeletal Exam: Patient is wheelchair-bound due to bilateral above-the-knee amputations.  C-spine has limited range of motion.  Shoulder abduction to about 100 degrees bilaterally.  Elbow joints have good range of motion.  Rheumatoid nodules palpable on the extensor surface of both elbows.  Synovitis of the right wrist and right MCP joints noted.  Synovial thickening of the left wrist and over MCP joints.  Ulnar deviation noted bilaterally.  Hip joints difficult to assess in wheelchair.  CDAI Exam: CDAI Score: 0.6  Patient Global: 3 mm; Provider Global: 3 mm Swollen: 0 ; Tender: 0  Joint Exam 03/06/2022   No joint exam has been documented for this visit   There is currently no information documented on the homunculus. Go to the Rheumatology activity and complete the homunculus joint exam.  Investigation: No additional findings.  Imaging: No results found.  Recent Labs: Lab Results  Component Value Date   WBC 4.3 01/01/2022   HGB 8.0 (L) 01/01/2022   PLT 243 01/01/2022   NA 137 01/01/2022   K 4.8 01/01/2022   CL 100 01/01/2022   CO2 28 01/01/2022   GLUCOSE 77 01/01/2022   BUN 47 (H) 01/01/2022   CREATININE 1.55 (H) 01/01/2022   BILITOT 0.3 01/01/2022   ALKPHOS 75 11/28/2020   AST 8 (L) 01/01/2022   ALT 4 (L) 01/01/2022   PROT 8.6 (H) 01/01/2022   ALBUMIN 2.9 (L) 11/28/2020   CALCIUM 9.3 01/01/2022   GFRAA 75 11/18/2019    Speciality Comments: No specialty comments available.  Procedures:  No procedures performed Allergies: Feraheme [ferumoxytol] and Rosiglitazone maleate   Assessment / Plan:     Visit Diagnoses: Rheumatoid arthritis involving multiple sites with positive rheumatoid factor ([place]) - Diagnosed by Dr. Charlestine Night when she was in 27s.  She was treated with methotrexate and prednisone.  She has  been off medication since 2013.  ESR 11, RF 732, and anti-CCP>250 on 01/01/22: She has ongoing  synovitis in multiple joints, most severe in her right wrist and over several MCP joints.  Rheumatoid nodules noted on the extensor surface of both elbows.  She remains wheelchair-bound due to having bilateral above-the-knee amputations.  She is not currently taking any immunosuppressive agents.  She is not a good candidate for methotrexate due to history of ILD and elevated creatinine.  We are hesitant to try Imuran due to the risk for immunosuppression.  Minocycline could be a possibility but there is concern for the elevation in creatinine.  We do not plan on proceeding with any immunosuppressive agents at this time. The plan is to keep her on low-dose prednisone 5 mg daily which can be managed by her PCP.  She will follow-up in our office as needed.  High risk medication use - She is not a good candidate for immunosuppression at this time.  Previously treated with methotrexate 25 mg once weekly and prednisone 5 mg daily from 2011-2013 prescribed by Dr. Charlestine Night.  Positive ANA (antinuclear antibody): ANA 1:1280 cytoplasmic, fibrillar filamentous.  The patient has no clinical features of systemic lupus at this time.  The following lab work will be obtained today for further evaluation.  We will call her with these lab results.- Plan: Anti-DNA antibody, double-stranded, Anti-scleroderma antibody, Anti-Smith antibody, RNP Antibody, Sjogrens syndrome-B extractable nuclear antibody, Sjogrens syndrome-A extractable nuclear antibody, Protein / creatinine ratio, urine, Cardiolipin antibodies, IgG, IgM, IgA, C3 and C4, Beta-2 glycoprotein antibodies  UIP (usual interstitial pneumonitis) ([facility]) - She was evaluated by Dr. Elsworth Soho in the past.  He did not recommend any treatment due to increased risk of side effects.   S/P AKA (above knee amputation) bilateral ([place]) - 09/2019.  History of recurrent diabetic foot ulcer and  gangrene.  Patient remains wheelchair-bound.  Essential hypertension: BP was 124/72 today in the office.   Other medical conditions are listed as follows:   History of type 2 diabetes mellitus   PAD (peripheral artery disease) ([place])   Hx of iron deficiency anemia  History of hypothyroidism   History of gastroesophageal reflux (GERD)  Orders: No orders of the defined types were placed in this encounter.  No orders of the defined types were placed in this encounter.     Follow-Up Instructions: Return if symptoms worsen or fail to improve, for Rheumatoid arthritis.   Ofilia Neas, PA-C  Note - This record has been created using Dragon software.  Chart creation errors have been sought, but may not always  have been located. Such creation errors do not reflect on  the standard of medical care.

## 2022-02-26 ENCOUNTER — Encounter: Payer: Self-pay | Admitting: *Deleted

## 2022-02-26 NOTE — Progress Notes (Signed)
Treatment options are limited for this patient.  We are unable to obtain labs despite multiple reminders.  Please send discharge letter to the patient.

## 2022-03-06 ENCOUNTER — Ambulatory Visit (INDEPENDENT_AMBULATORY_CARE_PROVIDER_SITE_OTHER): Payer: Medicare (Managed Care) | Admitting: Physician Assistant

## 2022-03-06 ENCOUNTER — Encounter: Payer: Self-pay | Admitting: Physician Assistant

## 2022-03-06 VITALS — BP 124/72 | HR 80

## 2022-03-06 DIAGNOSIS — Z8719 Personal history of other diseases of the digestive system: Secondary | ICD-10-CM

## 2022-03-06 DIAGNOSIS — J84112 Idiopathic pulmonary fibrosis: Secondary | ICD-10-CM

## 2022-03-06 DIAGNOSIS — Z89611 Acquired absence of right leg above knee: Secondary | ICD-10-CM | POA: Diagnosis not present

## 2022-03-06 DIAGNOSIS — Z8639 Personal history of other endocrine, nutritional and metabolic disease: Secondary | ICD-10-CM

## 2022-03-06 DIAGNOSIS — R7689 Other specified abnormal immunological findings in serum: Secondary | ICD-10-CM

## 2022-03-06 DIAGNOSIS — I10 Essential (primary) hypertension: Secondary | ICD-10-CM

## 2022-03-06 DIAGNOSIS — M069 Rheumatoid arthritis, unspecified: Secondary | ICD-10-CM

## 2022-03-06 DIAGNOSIS — Z79899 Other long term (current) drug therapy: Secondary | ICD-10-CM

## 2022-03-06 DIAGNOSIS — Z89612 Acquired absence of left leg above knee: Secondary | ICD-10-CM

## 2022-03-06 DIAGNOSIS — M0579 Rheumatoid arthritis with rheumatoid factor of multiple sites without organ or systems involvement: Secondary | ICD-10-CM

## 2022-03-06 DIAGNOSIS — Z862 Personal history of diseases of the blood and blood-forming organs and certain disorders involving the immune mechanism: Secondary | ICD-10-CM

## 2022-03-06 DIAGNOSIS — R768 Other specified abnormal immunological findings in serum: Secondary | ICD-10-CM

## 2022-03-06 DIAGNOSIS — I739 Peripheral vascular disease, unspecified: Secondary | ICD-10-CM

## 2022-03-06 DIAGNOSIS — Z5181 Encounter for therapeutic drug level monitoring: Secondary | ICD-10-CM

## 2022-03-07 NOTE — Progress Notes (Signed)
Patient had appt 03/06/2022 and labs, Dr. Estanislado Pandy will review.

## 2022-03-12 LAB — ANTI-SMITH ANTIBODY: ENA SM Ab Ser-aCnc: <1 AI

## 2022-03-12 LAB — BETA-2 GLYCOPROTEIN ANTIBODIES
Beta-2 Glyco 1 IgA: <2 U/mL
Beta-2 Glyco 1 IgM: <2 U/mL
Beta-2 Glyco I IgG: <2 U/mL

## 2022-03-12 LAB — C3 AND C4
C3 Complement: 56 mg/dL — ABNORMAL LOW (ref 83–193)
C4 Complement: 9 mg/dL — ABNORMAL LOW (ref 15–57)

## 2022-03-12 LAB — CARDIOLIPIN ANTIBODIES, IGG, IGM, IGA
Anticardiolipin IgA: <2 [APL'U]/mL
Anticardiolipin IgG: <2 [GPL'U]/mL
Anticardiolipin IgM: <2 [MPL'U]/mL

## 2022-03-12 LAB — ANTI-DNA ANTIBODY, DOUBLE-STRANDED: ds DNA Ab: <1 IU/mL

## 2022-03-12 LAB — SJOGRENS SYNDROME-B EXTRACTABLE NUCLEAR ANTIBODY: SSB (La) (ENA) Antibody, IgG: <1 AI

## 2022-03-12 LAB — ANTI-SCLERODERMA ANTIBODY: Scleroderma (Scl-70) (ENA) Antibody, IgG: <1 AI

## 2022-03-12 LAB — RNP ANTIBODY: Ribonucleic Protein(ENA) Antibody, IgG: <1 AI

## 2022-03-12 LAB — SJOGRENS SYNDROME-A EXTRACTABLE NUCLEAR ANTIBODY: SSA (Ro) (ENA) Antibody, IgG: 7.5 AI — AB

## 2022-03-13 NOTE — Progress Notes (Signed)
Antibody for Sjogren's are positive.  Complements are low.  All other autoimmune work-up was negative.  She had no symptoms of lupus or Sjogren's.  She may start prednisone 5 mg p.o. daily through her PCP if needed for rheumatoid arthritis.  Even prednisone will increase her risk of infection.  Please forward my note and the labs to her PCP.

## 2022-03-20 ENCOUNTER — Encounter: Payer: Self-pay | Admitting: *Deleted

## 2022-03-20 ENCOUNTER — Ambulatory Visit (INDEPENDENT_AMBULATORY_CARE_PROVIDER_SITE_OTHER): Payer: Medicare (Managed Care) | Admitting: Ophthalmology

## 2022-03-20 ENCOUNTER — Encounter (INDEPENDENT_AMBULATORY_CARE_PROVIDER_SITE_OTHER): Payer: Self-pay | Admitting: Ophthalmology

## 2022-03-20 DIAGNOSIS — Z794 Long term (current) use of insulin: Secondary | ICD-10-CM

## 2022-03-20 DIAGNOSIS — E113553 Type 2 diabetes mellitus with stable proliferative diabetic retinopathy, bilateral: Secondary | ICD-10-CM | POA: Diagnosis not present

## 2022-03-20 DIAGNOSIS — H353221 Exudative age-related macular degeneration, left eye, with active choroidal neovascularization: Secondary | ICD-10-CM

## 2022-03-20 MED ORDER — BEVACIZUMAB CHEMO INJECTION 1.25MG/0.05ML SYRINGE FOR KALEIDOSCOPE
1.2500 mg | INTRAVITREAL | Status: AC | PRN
  Administered 2022-03-20: 1.25 mg via INTRAVITREAL

## 2022-03-20 NOTE — Assessment & Plan Note (Signed)

## 2022-03-20 NOTE — Assessment & Plan Note (Signed)
OS, completely resolved as of OCT May 2023, also with no active diabetic maculopathy

## 2022-03-20 NOTE — Progress Notes (Signed)
03/20/2022     CHIEF COMPLAINT Patient presents for  Chief Complaint  Patient presents with   Macular Degeneration   Diabetic Retinopathy without Macular Edema      HISTORY OF PRESENT ILLNESS: [name] is a 76 y.o. female who presents to the clinic today for:   HPI   Onset of peripapillary CNVM with large subfoveal pigment epithelial detachment obviously vascularized September 2022.  Improved as of October and nearly resolved and completely resolved as of May 2023 OCT OS.  Today positioning very difficult patient examined in wheelchair is a bilateral amputee and up in a kyphotic position bent over  Pt states her vision has been stable  Pt denies any floaters or FOL  Last edited by Hurman Horn, MD on 03/20/2022  9:37 AM.      Referring physician: Janifer Adie, MD [place],  [place] [id number]  HISTORICAL INFORMATION:   Selected notes from the MEDICAL RECORD NUMBER    Lab Results  Component Value Date   HGBA1C 6.2 04/19/2021     CURRENT MEDICATIONS: No current outpatient medications on file. (Ophthalmic Drugs)   No current facility-administered medications for this visit. (Ophthalmic Drugs)   Current Outpatient Medications (Other)  Medication Sig   acetaminophen (TYLENOL) 325 MG tablet Take 2 tablets (650 mg total) by mouth every 6 (six) hours. (Patient taking differently: Take 650 mg by mouth as needed.)   amLODipine (NORVASC) 10 MG tablet Take 1 tablet (10 mg total) by mouth at bedtime.   aspirin EC 81 MG tablet Take 1 tablet (81 mg total) by mouth daily. Swallow whole.   doxycycline (VIBRA-TABS) 100 MG tablet Take 1 tablet (100 mg total) by mouth 2 (two) times daily. (Patient not taking: Reported on 01/01/2022)   ferrous sulfate 324 (65 Fe) MG TBEC TAKE 1 TABLET BY MOUTH EVERY DAY WITH BREAKFAST   fluticasone (FLONASE) 50 MCG/ACT nasal spray Place 1 spray into both nostrils daily. (Patient taking differently: Place 1 spray into both nostrils  as needed.)   furosemide (LASIX) 40 MG tablet TAKE 1 TABLET BY MOUTH EVERY DAY   gabapentin (NEURONTIN) 100 MG capsule TAKE 1 CAPSULE (100 MG TOTAL) BY MOUTH EVERY 8 (EIGHT) HOURS   GNP VITAMIN D MAXIMUM STRENGTH 50 MCG (2000 UT) TABS Take 1 tablet by mouth daily. (Patient not taking: Reported on 12/11/2021)   guaiFENesin-dextromethorphan (ROBITUSSIN DM) 100-10 MG/5ML syrup Take 5 mLs by mouth every 4 (four) hours as needed for cough. (Patient not taking: Reported on 03/06/2022)   Incontinence Supply Disposable (INCONTINENCE BRIEF MEDIUM) MISC Wear as needed for incontinence   levothyroxine (SYNTHROID) 150 MCG tablet TAKE 1 TABLET BY MOUTH EVERY DAY   lidocaine (LIDODERM) 5 % Apply one patch to each lower limbs for 12 hours a day as needed for phantom pain. Remove after 12 hours or as directed by MD   Misc. Devices MISC Mepilex silver sulfate-foam bandage 6x6" change dressing weekly (Patient not taking: Reported on 03/06/2022)   PROAIR HFA 108 (90 Base) MCG/ACT inhaler INHALE 2 PUFFS INTO THE LUNGS EVERY 6 HOURS AS NEEDED FOR WHEEZING OR SHORTNESS OF BREATH (Patient not taking: Reported on 03/06/2022)   rosuvastatin (CRESTOR) 20 MG tablet Take 1 tablet (20 mg total) by mouth daily.   No current facility-administered medications for this visit. (Other)      REVIEW OF SYSTEMS: ROS   Negative for: Constitutional, Gastrointestinal, Neurological, Skin, Genitourinary, Musculoskeletal, HENT, Endocrine, Cardiovascular, Eyes, Respiratory, Psychiatric, Allergic/Imm, Heme/Lymph  Last edited by Orene Desanctis D, CMA on 03/20/2022  8:59 AM.       ALLERGIES Allergies  Allergen Reactions   Feraheme [Ferumoxytol] Itching   Rosiglitazone Maleate Other (See Comments)    REACTION: Difficulty walking, Fatigue, shortness of breath    PAST MEDICAL HISTORY Past Medical History:  Diagnosis Date   Bilateral lower extremity edema 06/06/2014   Cataracts, bilateral 02/13/2011   Seen on eye exam 02/07/11. F/u in 12  months    Diabetes mellitus age 33   Diabetic ulcer of toe ([place]) 12/12/2015   Hyperlipidemia    Hypertension    Retinal detachment, old, partial    left   Retinopathy due to secondary diabetes mellitus ([place])    L>R, laser 3/07   Schizotypal personality disorder ([place])    Thyroid disease    Weight loss 01/22/2012   Past Surgical History:  Procedure Laterality Date   ABDOMINAL AORTOGRAM W/LOWER EXTREMITY N/A 07/23/2018   Procedure: ABDOMINAL AORTOGRAM W/LOWER EXTREMITY;  Surgeon: Marty Heck, MD;  Location: [facility];  Service: Cardiovascular;  Laterality: N/A;   ABOVE KNEE LEG AMPUTATION Bilateral 09/27/2019   AMPUTATION Bilateral 09/27/2019   Procedure: AMPUTATION ABOVE KNEE;  Surgeon: Angelia Mould, MD;  Location: [facility] OR;  Service: Vascular;  Laterality: Bilateral;   BREAST EXCISIONAL BIOPSY Bilateral    BREAST SURGERY     CATARACT EXTRACTION     left    EYE SURGERY     ROTATOR CUFF REPAIR  1990's   left   Thyroid radiation ablation     for Graves Disease   TRANSTHORACIC ECHOCARDIOGRAM      EF55-65%, nml - 12/14/2004    FAMILY HISTORY Family History  Problem Relation Age of Onset   Heart disease Mother    Hypertension Mother    Heart attack Mother    Pulmonary fibrosis Father    Liver disease Sister        transplant, liver   Colon cancer Brother    Liver cancer Brother    Colon cancer Maternal Grandmother    Diabetes Daughter    Breast cancer Niece     SOCIAL HISTORY Social History   Tobacco Use   Smoking status: Never    Passive exposure: Never   Smokeless tobacco: Never  Vaping Use   Vaping Use: Never used  Substance Use Topics   Alcohol use: No   Drug use: No         OPHTHALMIC EXAM:  Base Eye Exam     Visual Acuity (ETDRS)       Right Left   Dist [place] 20/30 20/30         Tonometry (Tonopen, 9:14 AM)       Right Left   Pressure 12 20         Neuro/Psych     Oriented x3: Yes   Mood/Affect: Normal          Dilation     Both eyes: 2.5% Phenylephrine, 1.0% Mydriacyl @ 9:14 AM           Slit Lamp and Fundus Exam     External Exam       Right Left   External Normal Normal         Slit Lamp Exam       Right Left   Lids/Lashes Normal Normal   Conjunctiva/Sclera White and quiet White and quiet   Cornea Clear Clear   Anterior Chamber Deep and quiet Deep  and quiet   Iris Round and reactive Round and reactive   Lens Open posterior capsule, Centered posterior chamber intraocular lens Open posterior capsule, Centered posterior chamber intraocular lens   Anterior Vitreous Normal Normal         Fundus Exam       Right Left   Posterior Vitreous Normal Normal   Disc Normal Old fibrous proliferations on the nerve, stable   C/D Ratio 0.5 0.5   Macula Focal laser scars Focal laser scars, no remaining macular thickening the remaining hemorrhage   Vessels PDR-quiet PDR-quiet   Periphery Laser scars, good PRP, no active N/V Laser scars, good PRP, no active N/V            IMAGING AND PROCEDURES  Imaging and Procedures for 03/20/22  Intravitreal Injection, Pharmacologic Agent - OS - Left Eye       Time Out 03/20/2022. 9:42 AM. Confirmed correct patient, procedure, site, and patient consented.   Anesthesia Topical anesthesia was used. Anesthetic medications included Lidocaine 4%.   Procedure Preparation included 5% betadine to ocular surface, 10% betadine to eyelids, Ofloxacin . A 30 gauge needle was used.   Injection: 1.25 mg Bevacizumab 1.'25mg'$ /0.79m   Route: Intravitreal, Site: Left Eye   NDC: [id number]  Post-op Post injection exam found visual acuity of at least counting fingers. The patient tolerated the procedure well. There were no complications. The patient received written and verbal post procedure care education. Post injection medications included ocuflox.              ASSESSMENT/PLAN:  Exudative age-related macular degeneration of left eye with  active choroidal neovascularization (HCC) OS, completely resolved as of OCT May 2023, also with no active diabetic maculopathy  Controlled type 2 diabetes mellitus with stable proliferative retinopathy of both eyes, with long-term current use of insulin (HCC) The nature of regressed proliferative diabetic retinopathy was discussed with the patient. The patient was advised to maintain good glucose, blood pressure, monitor kidney function and serum lipid control as advised by personal physician. Rare risk for reactivation of progression exist with untreated severe anemia, untreated renal failure, untreated heart failure, and smoking. Complete avoidance of smoking was recommended. The chance of recurrent proliferative diabetic retinopathy was discussed as well as the chance of vitreous hemorrhage for which further treatments may be necessary.   Explained to the patient that the quiescent  proliferative diabetic retinopathy disease is unlikely to ever worsen.  Worsening factors would include however severe anemia, hypertension out-of-control or impending renal failure.      ICD-10-CM   1. Exudative age-related macular degeneration of left eye with active choroidal neovascularization (HCC)  H35.3221 Intravitreal Injection, Pharmacologic Agent - OS - Left Eye    Bevacizumab (AVASTIN) SOLN 1.25 mg    CANCELED: OCT, Retina - OU - Both Eyes    2. Controlled type 2 diabetes mellitus with stable proliferative retinopathy of both eyes, with long-term current use of insulin ([place]  EG66.5993   Z79.4       1.  OS with high risk lesion found September 2022 subfoveal vascularized PED wet AMD treated with antivegF since that time and now essentially resolved as of findings May 2023.  By OCT  2.  For positioning today in a bilateral amputee in a wheelchair with kyphotic position, not able to obtain visualization with OCT or other imaging  3.  Medical examination discloses no residual PED however  Ophthalmic  Meds Ordered this visit:  Meds ordered this encounter  Medications   Bevacizumab (AVASTIN) SOLN 1.25 mg       Return in about 14 weeks (around 06/26/2022) for DILATE OU.  There are no Patient Instructions on file for this visit.   Explained the diagnoses, plan, and follow up with the patient and they expressed understanding.  Patient expressed understanding of the importance of proper follow up care.   Clent Demark Dash Cardarelli M.D. Diseases & Surgery of the Retina and Vitreous [facility] [place] 03/20/22     Abbreviations: M myopia (nearsighted); A astigmatism; H hyperopia (farsighted); P presbyopia; Mrx spectacle prescription;  CTL contact lenses; OD right eye; OS left eye; OU both eyes  XT exotropia; ET esotropia; PEK punctate epithelial keratitis; PEE punctate epithelial erosions; DES dry eye syndrome; MGD meibomian gland dysfunction; ATs artificial tears; PFAT's preservative free artificial tears; [place] nuclear sclerotic cataract; PSC posterior subcapsular cataract; ERM epi-retinal membrane; PVD posterior vitreous detachment; RD retinal detachment; DM diabetes mellitus; DR diabetic retinopathy; NPDR non-proliferative diabetic retinopathy; PDR proliferative diabetic retinopathy; CSME clinically significant macular edema; DME diabetic macular edema; dbh dot blot hemorrhages; CWS cotton wool spot; POAG primary open angle glaucoma; C/D cup-to-disc ratio; HVF humphrey visual field; GVF goldmann visual field; OCT optical coherence tomography; IOP intraocular pressure; BRVO Branch retinal vein occlusion; CRVO central retinal vein occlusion; CRAO central retinal artery occlusion; BRAO branch retinal artery occlusion; RT retinal tear; SB scleral buckle; PPV pars plana vitrectomy; VH Vitreous hemorrhage; PRP panretinal laser photocoagulation; IVK intravitreal kenalog; VMT vitreomacular traction; MH Macular hole;  NVD neovascularization of the disc; NVE neovascularization elsewhere; AREDS age related  eye disease study; ARMD age related macular degeneration; POAG primary open angle glaucoma; EBMD epithelial/anterior basement membrane dystrophy; ACIOL anterior chamber intraocular lens; IOL intraocular lens; PCIOL posterior chamber intraocular lens; Phaco/IOL phacoemulsification with intraocular lens placement; [place] photorefractive keratectomy; LASIK laser assisted in situ keratomileusis; HTN hypertension; DM diabetes mellitus; COPD chronic obstructive pulmonary disease

## 2022-03-22 ENCOUNTER — Other Ambulatory Visit: Payer: Self-pay

## 2022-03-22 ENCOUNTER — Encounter (HOSPITAL_COMMUNITY): Payer: Self-pay | Admitting: Internal Medicine

## 2022-03-22 ENCOUNTER — Emergency Department (HOSPITAL_COMMUNITY): Payer: Medicare (Managed Care)

## 2022-03-22 ENCOUNTER — Inpatient Hospital Stay (HOSPITAL_COMMUNITY)
Admission: EM | Admit: 2022-03-22 | Discharge: 2022-04-03 | DRG: 208 | Disposition: A | Discharge status: Skilled Nursing Facility | Payer: Medicare (Managed Care) | Attending: Internal Medicine | Admitting: Internal Medicine

## 2022-03-22 DIAGNOSIS — E1169 Type 2 diabetes mellitus with other specified complication: Secondary | ICD-10-CM

## 2022-03-22 DIAGNOSIS — K5900 Constipation, unspecified: Secondary | ICD-10-CM | POA: Diagnosis not present

## 2022-03-22 DIAGNOSIS — R6520 Severe sepsis without septic shock: Secondary | ICD-10-CM | POA: Diagnosis not present

## 2022-03-22 DIAGNOSIS — S298XXA Other specified injuries of thorax, initial encounter: Principal | ICD-10-CM

## 2022-03-22 DIAGNOSIS — B9562 Methicillin resistant Staphylococcus aureus infection as the cause of diseases classified elsewhere: Secondary | ICD-10-CM | POA: Diagnosis not present

## 2022-03-22 DIAGNOSIS — J690 Pneumonitis due to inhalation of food and vomit: Secondary | ICD-10-CM | POA: Diagnosis not present

## 2022-03-22 DIAGNOSIS — E113553 Type 2 diabetes mellitus with stable proliferative diabetic retinopathy, bilateral: Secondary | ICD-10-CM

## 2022-03-22 DIAGNOSIS — N1831 Chronic kidney disease, stage 3a: Secondary | ICD-10-CM | POA: Diagnosis present

## 2022-03-22 DIAGNOSIS — Z20822 Contact with and (suspected) exposure to covid-19: Secondary | ICD-10-CM | POA: Diagnosis present

## 2022-03-22 DIAGNOSIS — H269 Unspecified cataract: Secondary | ICD-10-CM | POA: Diagnosis present

## 2022-03-22 DIAGNOSIS — Z89611 Acquired absence of right leg above knee: Secondary | ICD-10-CM

## 2022-03-22 DIAGNOSIS — R079 Chest pain, unspecified: Secondary | ICD-10-CM | POA: Diagnosis not present

## 2022-03-22 DIAGNOSIS — E43 Unspecified severe protein-calorie malnutrition: Secondary | ICD-10-CM | POA: Insufficient documentation

## 2022-03-22 DIAGNOSIS — Z993 Dependence on wheelchair: Secondary | ICD-10-CM

## 2022-03-22 DIAGNOSIS — Z8249 Family history of ischemic heart disease and other diseases of the circulatory system: Secondary | ICD-10-CM

## 2022-03-22 DIAGNOSIS — T17928A Food in respiratory tract, part unspecified causing other injury, initial encounter: Secondary | ICD-10-CM | POA: Diagnosis not present

## 2022-03-22 DIAGNOSIS — Z79899 Other long term (current) drug therapy: Secondary | ICD-10-CM

## 2022-03-22 DIAGNOSIS — J15212 Pneumonia due to Methicillin resistant Staphylococcus aureus: Secondary | ICD-10-CM | POA: Diagnosis present

## 2022-03-22 DIAGNOSIS — Y92009 Unspecified place in unspecified non-institutional (private) residence as the place of occurrence of the external cause: Secondary | ICD-10-CM

## 2022-03-22 DIAGNOSIS — A419 Sepsis, unspecified organism: Secondary | ICD-10-CM | POA: Diagnosis not present

## 2022-03-22 DIAGNOSIS — Z634 Disappearance and death of family member: Secondary | ICD-10-CM

## 2022-03-22 DIAGNOSIS — E039 Hypothyroidism, unspecified: Secondary | ICD-10-CM | POA: Diagnosis not present

## 2022-03-22 DIAGNOSIS — M436 Torticollis: Secondary | ICD-10-CM | POA: Diagnosis not present

## 2022-03-22 DIAGNOSIS — N179 Acute kidney failure, unspecified: Secondary | ICD-10-CM

## 2022-03-22 DIAGNOSIS — Z66 Do not resuscitate: Secondary | ICD-10-CM | POA: Diagnosis not present

## 2022-03-22 DIAGNOSIS — W050XXA Fall from non-moving wheelchair, initial encounter: Secondary | ICD-10-CM | POA: Diagnosis present

## 2022-03-22 DIAGNOSIS — S2249XA Multiple fractures of ribs, unspecified side, initial encounter for closed fracture: Secondary | ICD-10-CM | POA: Diagnosis present

## 2022-03-22 DIAGNOSIS — J849 Interstitial pulmonary disease, unspecified: Secondary | ICD-10-CM | POA: Diagnosis present

## 2022-03-22 DIAGNOSIS — Z79890 Hormone replacement therapy (postmenopausal): Secondary | ICD-10-CM

## 2022-03-22 DIAGNOSIS — Z833 Family history of diabetes mellitus: Secondary | ICD-10-CM

## 2022-03-22 DIAGNOSIS — Z7952 Long term (current) use of systemic steroids: Secondary | ICD-10-CM

## 2022-03-22 DIAGNOSIS — E875 Hyperkalemia: Secondary | ICD-10-CM | POA: Diagnosis not present

## 2022-03-22 DIAGNOSIS — F21 Schizotypal disorder: Secondary | ICD-10-CM | POA: Diagnosis present

## 2022-03-22 DIAGNOSIS — S2242XA Multiple fractures of ribs, left side, initial encounter for closed fracture: Secondary | ICD-10-CM | POA: Diagnosis not present

## 2022-03-22 DIAGNOSIS — G9341 Metabolic encephalopathy: Secondary | ICD-10-CM | POA: Diagnosis not present

## 2022-03-22 DIAGNOSIS — K921 Melena: Secondary | ICD-10-CM | POA: Diagnosis not present

## 2022-03-22 DIAGNOSIS — I700 Atherosclerosis of aorta: Secondary | ICD-10-CM | POA: Diagnosis present

## 2022-03-22 DIAGNOSIS — D638 Anemia in other chronic diseases classified elsewhere: Secondary | ICD-10-CM | POA: Diagnosis present

## 2022-03-22 DIAGNOSIS — E785 Hyperlipidemia, unspecified: Secondary | ICD-10-CM | POA: Diagnosis present

## 2022-03-22 DIAGNOSIS — E1165 Type 2 diabetes mellitus with hyperglycemia: Secondary | ICD-10-CM | POA: Diagnosis present

## 2022-03-22 DIAGNOSIS — M069 Rheumatoid arthritis, unspecified: Secondary | ICD-10-CM | POA: Diagnosis present

## 2022-03-22 DIAGNOSIS — R54 Age-related physical debility: Secondary | ICD-10-CM | POA: Diagnosis present

## 2022-03-22 DIAGNOSIS — I129 Hypertensive chronic kidney disease with stage 1 through stage 4 chronic kidney disease, or unspecified chronic kidney disease: Secondary | ICD-10-CM | POA: Diagnosis present

## 2022-03-22 DIAGNOSIS — Z794 Long term (current) use of insulin: Secondary | ICD-10-CM

## 2022-03-22 DIAGNOSIS — Z681 Body mass index (BMI) 19 or less, adult: Secondary | ICD-10-CM

## 2022-03-22 DIAGNOSIS — Z89612 Acquired absence of left leg above knee: Secondary | ICD-10-CM

## 2022-03-22 DIAGNOSIS — J9602 Acute respiratory failure with hypercapnia: Secondary | ICD-10-CM | POA: Diagnosis not present

## 2022-03-22 DIAGNOSIS — I10 Essential (primary) hypertension: Secondary | ICD-10-CM | POA: Diagnosis present

## 2022-03-22 DIAGNOSIS — J9601 Acute respiratory failure with hypoxia: Secondary | ICD-10-CM

## 2022-03-22 LAB — BASIC METABOLIC PANEL
Anion gap: 8 (ref 5–15)
BUN: 41 mg/dL — ABNORMAL HIGH (ref 8–23)
CO2: 22 mmol/L (ref 22–32)
Calcium: 9.2 mg/dL (ref 8.9–10.3)
Chloride: 106 mmol/L (ref 98–111)
Creatinine, Ser: 1.34 mg/dL — ABNORMAL HIGH (ref 0.44–1.00)
GFR, Estimated: 41 mL/min — ABNORMAL LOW (ref >60)
Glucose, Bld: 197 mg/dL — ABNORMAL HIGH (ref 70–99)
Potassium: 4.3 mmol/L (ref 3.5–5.1)
Sodium: 136 mmol/L (ref 135–145)

## 2022-03-22 LAB — CBC
HCT: 34.8 % — ABNORMAL LOW (ref 36.0–46.0)
Hemoglobin: 10.7 g/dL — ABNORMAL LOW (ref 12.0–15.0)
MCH: 27.6 pg (ref 26.0–34.0)
MCHC: 30.7 g/dL (ref 30.0–36.0)
MCV: 89.9 fL (ref 80.0–100.0)
Platelets: 297 10*3/uL (ref 150–400)
RBC: 3.87 MIL/uL (ref 3.87–5.11)
RDW: 13.6 % (ref 11.5–15.5)
WBC: 8.8 10*3/uL (ref 4.0–10.5)
nRBC: 0 % (ref 0.0–0.2)

## 2022-03-22 LAB — TSH: TSH: <0.01 u[IU]/mL — ABNORMAL LOW (ref 0.350–4.500)

## 2022-03-22 MED ORDER — ROSUVASTATIN CALCIUM 20 MG PO TABS
20.0000 mg | ORAL_TABLET | Freq: Every day | ORAL | Status: DC
  Administered 2022-03-22 – 2022-03-27 (×6): 20 mg via ORAL
  Filled 2022-03-22 (×6): qty 1

## 2022-03-22 MED ORDER — FENTANYL CITRATE PF 50 MCG/ML IJ SOSY: 50.0000 ug | PREFILLED_SYRINGE | Freq: Once | INTRAMUSCULAR | Status: DC

## 2022-03-22 MED ORDER — ENOXAPARIN SODIUM 30 MG/0.3ML IJ SOSY
30.0000 mg | PREFILLED_SYRINGE | INTRAMUSCULAR | Status: DC
  Administered 2022-03-22 – 2022-03-31 (×10): 30 mg via SUBCUTANEOUS
  Filled 2022-03-22 (×10): qty 0.3

## 2022-03-22 MED ORDER — OXYCODONE HCL 5 MG PO TABS
5.0000 mg | ORAL_TABLET | Freq: Four times a day (QID) | ORAL | Status: DC | PRN
  Administered 2022-03-22 – 2022-03-23 (×4): 5 mg via ORAL
  Filled 2022-03-22 (×4): qty 1

## 2022-03-22 MED ORDER — OXYCODONE HCL 5 MG PO TABS: 5.0000 mg | ORAL_TABLET | ORAL | Status: DC | PRN

## 2022-03-22 MED ORDER — FENTANYL CITRATE PF 50 MCG/ML IJ SOSY
50.0000 ug | PREFILLED_SYRINGE | Freq: Once | INTRAMUSCULAR | Status: AC
  Administered 2022-03-22: 50 ug via INTRAVENOUS
  Filled 2022-03-22: qty 1

## 2022-03-22 MED ORDER — ACETAMINOPHEN 325 MG PO TABS: 650.0000 mg | ORAL_TABLET | ORAL | Status: DC | PRN

## 2022-03-22 MED ORDER — GABAPENTIN 100 MG PO CAPS
100.0000 mg | ORAL_CAPSULE | Freq: Three times a day (TID) | ORAL | Status: DC
  Administered 2022-03-22 – 2022-03-26 (×13): 100 mg via ORAL
  Filled 2022-03-22 (×14): qty 1

## 2022-03-22 MED ORDER — ACETAMINOPHEN 500 MG PO TABS
1000.0000 mg | ORAL_TABLET | Freq: Three times a day (TID) | ORAL | Status: DC
  Administered 2022-03-22 – 2022-03-26 (×13): 1000 mg via ORAL
  Filled 2022-03-22 (×15): qty 2

## 2022-03-22 MED ORDER — AMLODIPINE BESYLATE 10 MG PO TABS
10.0000 mg | ORAL_TABLET | Freq: Every day | ORAL | Status: DC
  Administered 2022-03-22 – 2022-03-25 (×4): 10 mg via ORAL
  Filled 2022-03-22 (×4): qty 1

## 2022-03-22 MED ORDER — LEVOTHYROXINE SODIUM 75 MCG PO TABS
150.0000 ug | ORAL_TABLET | Freq: Every day | ORAL | Status: DC
  Administered 2022-03-22 – 2022-03-26 (×5): 150 ug via ORAL
  Filled 2022-03-22 (×6): qty 2

## 2022-03-22 MED ORDER — POLYETHYLENE GLYCOL 3350 17 G PO PACK: 17.0000 g | PACK | Freq: Every day | ORAL | Status: DC | PRN

## 2022-03-22 MED ORDER — HYDROCODONE-ACETAMINOPHEN 5-325 MG PO TABS: 1.0000 | ORAL_TABLET | Freq: Four times a day (QID) | ORAL | Status: DC | PRN

## 2022-03-22 MED ORDER — ASPIRIN 81 MG PO TBEC
81.0000 mg | DELAYED_RELEASE_TABLET | Freq: Every day | ORAL | Status: DC
  Administered 2022-03-22 – 2022-03-26 (×5): 81 mg via ORAL
  Filled 2022-03-22 (×5): qty 1

## 2022-03-22 MED ORDER — PREDNISONE 5 MG PO TABS
5.0000 mg | ORAL_TABLET | Freq: Every morning | ORAL | Status: DC
  Administered 2022-03-22 – 2022-03-26 (×5): 5 mg via ORAL
  Filled 2022-03-22 (×6): qty 1

## 2022-03-22 MED ORDER — ONDANSETRON HCL 4 MG PO TABS
4.0000 mg | ORAL_TABLET | Freq: Three times a day (TID) | ORAL | Status: DC | PRN
  Administered 2022-03-23 – 2022-03-24 (×2): 4 mg via ORAL
  Filled 2022-03-22 (×2): qty 1

## 2022-03-22 MED ORDER — ONDANSETRON HCL 4 MG/2ML IJ SOLN
4.0000 mg | Freq: Once | INTRAMUSCULAR | Status: AC
  Administered 2022-03-22: 4 mg via INTRAVENOUS
  Filled 2022-03-22: qty 2

## 2022-03-22 MED ORDER — LIDOCAINE 5 % EX PTCH
1.0000 | MEDICATED_PATCH | CUTANEOUS | Status: DC
Start: 2022-03-22 — End: 2022-04-04
  Administered 2022-03-22 – 2022-04-03 (×13): 1 via TRANSDERMAL
  Filled 2022-03-22 (×13): qty 1

## 2022-03-22 NOTE — H&P (Signed)
Date: 03/22/2022               Patient Name:  [name] MRN: [id number]  DOB: 1946/02/03 Age / Sex: 76 y.o., female   PCP: Janifer Adie, MD         Medical Service: Internal Medicine Teaching Service         Attending Physician: Dr. Velna Ochs, MD    First Contact: Dr. Alton Revere Pager: [phone]  Second Contact: Dr. Collene Gobble Pager: [phone]       After Hours (After 5p/  First Contact Pager: [phone]  weekends / holidays): Second Contact Pager: [phone]   Chief Concern: Fall  History of Present Illness:   [name] is a 76 year old female with past medical history of RA not on immunosuppressive therapy, ILD not on treatment, bilateral AKA 2/2 diabetic ulcer 2021, wheelchair dependent, hypothyroidism, type 2 diabetes, hypertension, who presents to the hospital after a fall.  Patient reports sleeping on her wheelchair and fell forward.  She did not wear her seatbelt last night.  She denies loss of consciousness or syncope.  She endorses significant pain on her left-sided chest that is worse with movement, coughing and deep breathing.  Also endorses left leg pain that started 2 hours ago.  Patient is seen by [facility] but does not remember her doctor.  She takes all her medications at home from a pill pack.  She lives with 2 grandsons and is independent with all her ADLs including bathing, dressing and preparing meals.  She has a bedside commode and she also changed on the commode on her own.  She typically microwave TV dinners for her meals.  Patient said that she feels safe and comfortable living in her house.  However recently it has become more difficult because her electrical wheelchair's battery has died.  In the ED, CT chest showed acute left fifth through eighth rib fractures.  Otherwise no hematoma, pneumothorax, hemothorax or pneumomediastinum.  Patient will be admitted for pain control and physical therapy.  Meds:  Current Meds  Medication Sig   amLODipine (NORVASC)  10 MG tablet Take 1 tablet (10 mg total) by mouth at bedtime. (Patient taking differently: Take 10 mg by mouth every morning.)   Cholecalciferol (VITAMIN D3) 50 MCG (2000 UT) TABS Take 2,000 Units by mouth every morning.   furosemide (LASIX) 40 MG tablet TAKE 1 TABLET BY MOUTH EVERY DAY (Patient taking differently: Take 40 mg by mouth every morning.)   levothyroxine (SYNTHROID) 175 MCG tablet Take 175 mcg by mouth every morning.   lidocaine (LIDODERM) 5 % Apply one patch to each lower limbs for 12 hours a day as needed for phantom pain. Remove after 12 hours or as directed by MD (Patient taking differently: Place 1 patch onto the skin See admin instructions. Apply one patch to each lower limbs for 12 hours a day as needed for phantom pain. Remove after 12 hours or as directed by MD)   losartan (COZAAR) 50 MG tablet Take 50 mg by mouth every morning.   nystatin (MYCOSTATIN/NYSTOP) powder Apply 1 Application topically 2 (two) times daily as needed (yeast under breasts (intertrigo)).   predniSONE (DELTASONE) 5 MG tablet Take 5 mg by mouth every morning.   rosuvastatin (CRESTOR) 20 MG tablet Take 1 tablet (20 mg total) by mouth daily. (Patient taking differently: Take 20 mg by mouth every morning.)    Allergies: Allergies as of 03/22/2022 - Review Complete 03/22/2022  Allergen Reaction Noted  Feraheme [ferumoxytol] Itching 02/03/2018   Rosiglitazone maleate Other (See Comments) 02/20/2006   Past Medical History:  Diagnosis Date   Bilateral lower extremity edema 06/06/2014   Cataracts, bilateral 02/13/2011   Seen on eye exam 02/07/11. F/u in 12 months    Diabetes mellitus age 32   Diabetic ulcer of toe ([place]) 12/12/2015   Hyperlipidemia    Hypertension    Retinal detachment, old, partial    left   Retinopathy due to secondary diabetes mellitus ([place])    L>R, laser 3/07   Schizotypal personality disorder ([place])    Thyroid disease    Weight loss 01/22/2012    Family History:  Family History   Problem Relation Age of Onset   Heart disease Mother    Hypertension Mother    Heart attack Mother    Pulmonary fibrosis Father    Liver disease Sister        transplant, liver   Colon cancer Brother    Liver cancer Brother    Colon cancer Maternal Grandmother    Diabetes Daughter    Breast cancer Niece      Social History:  Lives at home with grandsons Independent of ADLs PCP: [facility] No alcohol, cigarette or drug use  Review of Systems: A complete ROS was negative except as per HPI.   Physical Exam: Blood pressure (!) 151/78, pulse 94, temperature 98.9 F (37.2 C), resp. rate 17, height '5\' 2"'$  (1.575 m), weight 40 kg, SpO2 100 %. Physical Exam Constitutional:      General: She is in acute distress.     Comments: Chronically ill appearance.  Cachectic.  HENT:     Head: Normocephalic.  Eyes:     General:        Right eye: No discharge.        Left eye: No discharge.     Conjunctiva/sclera: Conjunctivae normal.     Comments: Has a movable, smooth surfaced mass seen on the left upper eyelid  Cardiovascular:     Rate and Rhythm: Normal rate and regular rhythm.  Pulmonary:     Effort: Pulmonary effort is normal.     Breath sounds: Normal breath sounds.     Comments: Unable to take a full deep breath.  Lungs sound normal in the upper fields.  Could not auscultate the lower fields due to position and pain Abdominal:     General: Bowel sounds are normal. There is no distension.     Palpations: Abdomen is soft.     Tenderness: There is no abdominal tenderness.  Musculoskeletal:     Comments: Significant tenderness to palpation of left upper chest and lower chest.  No visible hematoma or bruising.  No bony protrusion. Bilateral AKA.  Stumps appear well-healed.  Left upper thigh appears normal, no hematoma or bruising.  Good range of motion of left hip. Typical rheumatoid arthritis deformity of bilateral hands.  No swelling, erythema or tenderness to palpation.   Skin:     General: Skin is warm.  Neurological:     Mental Status: She is alert and oriented to person, place, and time.  Psychiatric:     Comments: Sad affect      EKG: personally reviewed my interpretation is sinus rhythm  CXR: personally reviewed my interpretation is focal consolidation.  Left base scarring noted.  Assessment & Plan by Problem: Principal Problem:   Multiple rib fractures Active Problems:   Hypothyroidism   HYPERTENSION, BENIGN SYSTEMIC   Rheumatoid arthritis ([place])  S/P AKA (above knee amputation) bilateral (HCC)   Controlled type 2 diabetes mellitus with stable proliferative retinopathy of both eyes, with long-term current use of insulin ([place])   ILD (interstitial lung disease) (HCC)  [name] is a 76 year old female with past medical history of RA not on immunosuppressive therapy, ILD not on treatment, bilateral AKA 2/2 diabetic ulcer 2021, wheelchair dependent, type 2 diabetes, hypertension, who was admitted pain control of multiple rib fracture after a fall and safe disposition.  Multiple rib fractures Status post bilateral AKA Wheelchair dependent Patient has nondisplaced fracture of left rib 5-7, and 7-8 with fracture displacement.  Fortunately there was no hematoma, pneumomediastinum, hemothorax or pneumothorax.  No pelvic fracture.  She at risk for developing atelectasis and pneumonia due to inability to take full deep breath. -Pain control with lidocaine patch, scheduled Tylenol and oxy as needed. -Avoid NSAIDs due to kidney dysfunction -Spirometry and flutter valve ordered -Physical therapy  -She may need more assistance at home.  We will reach out to [facility] later if she needs to go to a SNF.   AKI or CKD Unsure baseline.  Last creatinine 1.55 in May. Lasix was on her medication list but I am not sure of the indication.  May be diastolic heart failure.  Echo 2019 showed normal EF with LVH and mild mitral stenosis.  Patient appears euvolemic on exam.  Will hold  Lasix for now. -Encourage p.o. intake -Repeat BMP in a.m.  RA She is seen a rheumatologist was not on immunosuppressive therapy due to other comorbidities including IDLs.  She is currently not on steroid.  No signs of RA flare on exam.  ILD She is follow-up with pulmonology.  Currently not on treatment due to comorbidities.  Type 2 diabetes She currently not on any therapy. -Check A1c  Hypertension -Resume amlodipine 10 mg  Hyperlipidemia Aortic calcification -Resume aspirin and rosuvastatin  Hypothyroidism -Continue levothyroxine -Check TSH  Full code Diet: Dysphagia 3.  No denture IVF: N/A DVT: Lovenox  Dispo: Admit patient to Observation with expected length of stay less than 2 midnights.  SignedGaylan Gerold, DO 03/22/2022, 10:44 AM  Pager: [phone] After 5pm on weekdays and 1pm on weekends: On Call pager: [phone]

## 2022-03-22 NOTE — ED Notes (Signed)
To ct
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
## 2022-03-22 NOTE — Evaluation (Signed)
Physical Therapy Evaluation Patient Details Name: [name] MRN: [id number] DOB: 11-08-1945 Today's Date: 03/22/2022  History of Present Illness  Patient is a 76 y/o female who presents with left rib fxs 5-8 s/p fall from her w/c.  PMH includes bil AKAs, schizotypal personality, HTN, DM, retinal detachment.  Clinical Impression  Patient presents with pain on entire left side of body limiting any movement during PT evaluation. Pt not the best historian today, sleepy upon PT arrival and slightly confused. Pt reports she lives with her 2 grandsons and mainly uses her motorized w/c for all mobility however the battery died recently so pt had to use her manual w/c, which she is unable to propel on her own. [facility]. Normally able to perform ADLs and toilet herself. Today, pt not able to tolerate any movement in the bed, moving all extremities minimally due to hypersensitivity to pain. Worked on cervical spine AROM as pt favoring right rotation and able to get to midline after MET but not sustain due to pain. Likely will not be able to return home as pt not able to care for self. Would benefit from SNF to maximize independence and mobility prior to return home. Will follow acutely and hopefully can progress activity/mobility when medicated and pain improves.      Recommendations for follow up therapy are one component of a multi-disciplinary discharge planning process, led by the attending physician.  Recommendations may be updated based on patient status, additional functional criteria and insurance authorization.  Follow Up Recommendations Skilled nursing-short term rehab (<3 hours/day) Can patient physically be transported by private vehicle: No    Assistance Recommended at Discharge Frequent or constant Supervision/Assistance  Patient can return home with the following  Two people to help with walking and/or transfers;Direct supervision/assist for financial management;A lot of  help with bathing/dressing/bathroom;Assistance with cooking/housework;Direct supervision/assist for medications management;Assist for transportation    Equipment Recommendations None recommended by PT  Recommendations for Other Services       Functional Status Assessment Patient has had a recent decline in their functional status and/or demonstrates limited ability to make significant improvements in function in a reasonable and predictable amount of time     Precautions / Restrictions Precautions Precautions: Fall;Other (comment) Precaution Comments: bil AKAs Restrictions Weight Bearing Restrictions: No      Mobility  Bed Mobility               General bed mobility comments: Unable to tolerate any movement in the bed re: rolling, Worked on cervical spine stretching, AROM as pt favors right cervical rotation and able to get to midline with MET and time. Pain with any movement mostly in left side- UE/neck/trunk/LE.    Transfers                   General transfer comment: Unable to attempt, pain limiting    Ambulation/Gait                  Stairs            Wheelchair Mobility    Modified Rankin (Stroke Patients Only)       Balance                                             Pertinent Vitals/Pain Pain Assessment Pain Assessment: 0-10 Pain Score: 10-Worst  pain ever Pain Location: left residual limb, LUE, neck Pain Descriptors / Indicators: Grimacing, Guarding, Constant, Discomfort, Sore, Sharp Pain Intervention(s): Monitored during session, Repositioned, Patient requesting pain meds-RN notified, Limited activity within patient's tolerance    Home Living Family/patient expects to be discharged to:: Private residence Living Arrangements: Other relatives (2 grandsons) Available Help at Discharge: Family;Available PRN/intermittently Type of Home: House         Home Layout: One level Home Equipment: Wheelchair -  Environmental manager (4 wheels)      Prior Function               Mobility Comments: Pt reports normally she uses her motorized w/c for all mobility, but the battery recently died (a day before her fall) so she had to use her manual w/c. She is unable to manuever her manual w/c due to RA in bil hands. [facility]. Performs scoot transfers independently ADLs Comments: independent per report     Hand Dominance   Dominant Hand: Right    Extremity/Trunk Assessment   Upper Extremity Assessment Upper Extremity Assessment: Defer to OT evaluation;RUE deficits/detail;LUE deficits/detail (RA in bil hands, minimally functional) RUE Deficits / Details: Limited AROM throughout due to pain with any movement, RA in hands LUE Deficits / Details: Limited AROM throughout due to pain with any movement, RA in hands    Lower Extremity Assessment Lower Extremity Assessment: Generalized weakness;RLE deficits/detail;LLE deficits/detail RLE Deficits / Details: AKA LLE Deficits / Details: AKA, pain to light touch, able to perform hip flexion and abduction but increased pain with any movement, phantom pain reported    Cervical / Trunk Assessment Cervical / Trunk Assessment: Other exceptions Cervical / Trunk Exceptions: right rotation as position of preference due to pain with getting to neutral, unable to rotate neck towards left  Communication   Communication: No difficulties  Cognition Arousal/Alertness: Lethargic Behavior During Therapy: Flat affect Overall Cognitive Status: Difficult to assess                                 General Comments: Pt with poor memory, getting her days mixed up and sure she did not eat breakfast today despite having the tray half eaten near the bed. not the best historian and difficult getting info about home/PLOF etc. "I am useless."        General Comments General comments (skin integrity, edema, etc.): VSS on RA.     Exercises Other Exercises Other Exercises: MET of cervical spine to attempt getting PROM/AROM to midline/neutral   Assessment/Plan    PT Assessment Patient needs continued PT services  PT Problem List Decreased range of motion;Decreased strength;Decreased mobility;Pain;Decreased balance;Decreased cognition       PT Treatment Interventions Therapeutic activities;Neuromuscular re-education;Balance training;Functional mobility training;Wheelchair mobility training;Therapeutic exercise;Patient/family education;Cognitive remediation    PT Goals (Current goals can be found in the Care Plan section)  Acute Rehab PT Goals Patient Stated Goal: to be able to move, decrease pain PT Goal Formulation: With patient Time For Goal Achievement: 04/05/22 Potential to Achieve Goals: Fair    Frequency Min 2X/week     Co-evaluation               AM-PAC PT "6 Clicks" Mobility  Outcome Measure Help needed turning from your back to your side while in a flat bed without using bedrails?: Total Help needed moving from lying on your back to sitting on the side of a flat  bed without using bedrails?: Total Help needed moving to and from a bed to a chair (including a wheelchair)?: Total Help needed standing up from a chair using your arms (e.g., wheelchair or bedside chair)?: Total Help needed to walk in hospital room?: Total Help needed climbing 3-5 steps with a railing? : Total 6 Click Score: 6    End of Session   Activity Tolerance: Patient limited by pain Patient left: in bed;with call bell/phone within reach;with nursing/sitter in room Nurse Communication: Mobility status;Need for lift equipment PT Visit Diagnosis: Pain Pain - Right/Left: Left Pain - part of body: Leg;Arm (trunk)    Time: 7737-3668 PT Time Calculation (min) (ACUTE ONLY): 25 min   Charges:   PT Evaluation $PT Eval Moderate Complexity: 1 Mod PT Treatments $Therapeutic Exercise: 8-22 mins        Marisa Severin, PT,  DPT Acute Rehabilitation Services Secure chat preferred Office [place] 03/22/2022, 2:44 PM
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
## 2022-03-23 LAB — BASIC METABOLIC PANEL
Anion gap: 8 (ref 5–15)
BUN: 39 mg/dL — ABNORMAL HIGH (ref 8–23)
CO2: 23 mmol/L (ref 22–32)
Calcium: 8.7 mg/dL — ABNORMAL LOW (ref 8.9–10.3)
Chloride: 105 mmol/L (ref 98–111)
Creatinine, Ser: 1.34 mg/dL — ABNORMAL HIGH (ref 0.44–1.00)
GFR, Estimated: 41 mL/min — ABNORMAL LOW (ref >60)
Glucose, Bld: 203 mg/dL — ABNORMAL HIGH (ref 70–99)
Potassium: 4.4 mmol/L (ref 3.5–5.1)
Sodium: 136 mmol/L (ref 135–145)

## 2022-03-23 LAB — CBC
HCT: 25.1 % — ABNORMAL LOW (ref 36.0–46.0)
Hemoglobin: 7.7 g/dL — ABNORMAL LOW (ref 12.0–15.0)
MCH: 28.4 pg (ref 26.0–34.0)
MCHC: 30.7 g/dL (ref 30.0–36.0)
MCV: 92.6 fL (ref 80.0–100.0)
Platelets: 271 10*3/uL (ref 150–400)
RBC: 2.71 MIL/uL — ABNORMAL LOW (ref 3.87–5.11)
RDW: 13.5 % (ref 11.5–15.5)
WBC: 10.9 10*3/uL — ABNORMAL HIGH (ref 4.0–10.5)
nRBC: 0 % (ref 0.0–0.2)

## 2022-03-23 LAB — GLUCOSE, CAPILLARY
Glucose-Capillary: 148 mg/dL — ABNORMAL HIGH (ref 70–99)
Glucose-Capillary: 97 mg/dL (ref 70–99)
Glucose-Capillary: 161 mg/dL — ABNORMAL HIGH (ref 70–99)

## 2022-03-23 LAB — HEMOGLOBIN A1C
Hgb A1c MFr Bld: 6.2 % — ABNORMAL HIGH (ref 4.8–5.6)
Mean Plasma Glucose: 131.24 mg/dL

## 2022-03-23 MED ORDER — INSULIN ASPART 100 UNIT/ML IJ SOLN
0.0000 [IU] | Freq: Three times a day (TID) | INTRAMUSCULAR | Status: DC
  Administered 2022-03-23: 2 [IU] via SUBCUTANEOUS
  Administered 2022-03-24: 1 [IU] via SUBCUTANEOUS
  Administered 2022-03-24 – 2022-03-26 (×5): 2 [IU] via SUBCUTANEOUS
  Administered 2022-03-26: 1 [IU] via SUBCUTANEOUS
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
## 2022-03-23 NOTE — NC FL2 (Signed)
[place] MEDICAID FL2 LEVEL OF CARE SCREENING TOOL     IDENTIFICATION  Patient Name: [name] Birthdate: [birth year] Sex: female Admission Date (Current Location): 03/22/2022  [facility] and [place] Number:  Herbalist and Address:  [facility], [place] [address] [place], [place] [address]      Provider Number: [id number]  Attending Physician Name and Address:  Velna Ochs, MD  Relative Name and Phone Number:  Gerlean Ren [phone]    Current Level of Care: Hospital Recommended Level of Care: [place] Prior Approval Number:    Date Approved/Denied:   PASRR Number: [id number]  Discharge Plan: SNF    Current Diagnoses: Patient Active Problem List   Diagnosis Date Noted   Multiple rib fractures 03/22/2022   AKI (acute kidney injury) ([place])    Failure to attend appointment with reason given 11/09/2021   Exudative age-related macular degeneration of left eye with active choroidal neovascularization ([place]) 05/01/2021   Deep retinal hemorrhage, left 05/01/2021   High risk social situation 04/25/2021   CAD (coronary artery disease) 04/25/2021   Aortic atherosclerosis ([place]) 04/25/2021   Vertebral fracture, osteoporotic ([place]) 01/22/2021   ILD (interstitial lung disease) ([place]) 11/28/2020   Controlled type 2 diabetes mellitus with stable proliferative retinopathy of both eyes, with long-term current use of insulin ([place]) 08/07/2020   Pseudophakia of both eyes 08/07/2020   At high risk for pressure injury of skin 12/16/2019   Uncontrolled type 2 diabetes mellitus with hyperglycemia ([place]) 11/12/2019   S/P AKA (above knee amputation) bilateral ([place]) 11/12/2019   Food insecurity 11/12/2019   Wound infection    Moderate protein-calorie malnutrition (HCC)    Left breast mass 06/02/2019   Stress 06/02/2019   Peripheral vascular disease ([place]) 07/22/2018   Dyspnea 03/23/2018   Impaired functional mobility, balance, gait, and  endurance 03/02/2018   Abnormality of gait 02/26/2018   Iron deficiency anemia 03/19/2017   Vision disturbance 03/20/2015   Memory difficulty 03/20/2015   At high risk for falls 11/10/2012   DIABETIC  RETINOPATHY 03/17/2007   Type 2 diabetes mellitus with diabetic neuropathy, unspecified ([place]) 03/17/2007   Hypothyroidism 10/16/2006   Type 2 diabetes mellitus ([place]) 10/16/2006   OBESITY, NOS 10/16/2006   HYPERTENSION, BENIGN SYSTEMIC 10/16/2006   GASTROESOPHAGEAL REFLUX, NO ESOPHAGITIS 10/16/2006   Rheumatoid arthritis ([place]) 10/16/2006   Disorder of bone and cartilage 10/16/2006   INCONTINENCE, URGE 10/16/2006    Orientation RESPIRATION BLADDER Height & Weight     Self, Time, Situation, Place  Normal External catheter, Incontinent Weight: 88 lb 2.9 oz (40 kg) Height:  '5\' 2"'$  (157.5 cm)  BEHAVIORAL SYMPTOMS/MOOD NEUROLOGICAL BOWEL NUTRITION STATUS      Continent Diet (see discharge summary)  AMBULATORY STATUS COMMUNICATION OF NEEDS Skin   Total Care (bilateral AKA) Verbally Normal                       Personal Care Assistance Level of Assistance  Bathing, Dressing Bathing Assistance: Maximum assistance   Dressing Assistance: Maximum assistance     Functional Limitations Info  Sight Sight Info: Impaired        SPECIAL CARE FACTORS FREQUENCY  PT (By licensed PT), OT (By licensed OT)     PT Frequency: per facility OT Frequency: per facility            Contractures      Additional Factors Info  Code Status, Allergies Code Status Info: FULL Allergies Info: Feraheme (ferumoxytol) ;  Rosiglitazone Maleate           Current Medications (03/23/2022):  This is the current hospital active medication list Current Facility-Administered Medications  Medication Dose Route Frequency Provider Last Rate Last Admin   acetaminophen (TYLENOL) tablet 1,000 mg  1,000 mg Oral Q8H Gaylan Gerold, DO   1,000 mg at 03/23/22 9163   amLODipine (NORVASC) tablet 10 mg  10 mg Oral  QHS Gaylan Gerold, DO   10 mg at 03/22/22 2258   aspirin EC tablet 81 mg  81 mg Oral Daily Gaylan Gerold, DO   81 mg at 03/23/22 1018   enoxaparin (LOVENOX) injection 30 mg  30 mg Subcutaneous Q24H Gaylan Gerold, DO   30 mg at 03/23/22 1019   gabapentin (NEURONTIN) capsule 100 mg  100 mg Oral Q8H Gaylan Gerold, DO   100 mg at 03/23/22 8466   insulin aspart (novoLOG) injection 0-9 Units  0-9 Units Subcutaneous TID WC Mapp, Tavien, MD       levothyroxine (SYNTHROID) tablet 150 mcg  150 mcg Oral Daily Gaylan Gerold, DO   150 mcg at 03/23/22 0621   lidocaine (LIDODERM) 5 % 1 patch  1 patch Transdermal Q24H Gaylan Gerold, DO   1 patch at 03/23/22 1009   ondansetron (ZOFRAN) tablet 4 mg  4 mg Oral Q8H PRN Gaylan Gerold, DO   4 mg at 03/23/22 0356   oxyCODONE (Oxy IR/ROXICODONE) immediate release tablet 5 mg  5 mg Oral Q6H PRN Gaylan Gerold, DO   5 mg at 03/23/22 1018   polyethylene glycol (MIRALAX / GLYCOLAX) packet 17 g  17 g Oral Daily PRN Gaylan Gerold, DO       predniSONE (DELTASONE) tablet 5 mg  5 mg Oral q morning Gaylan Gerold, DO   5 mg at 03/23/22 1018   rosuvastatin (CRESTOR) tablet 20 mg  20 mg Oral Daily Gaylan Gerold, DO   20 mg at 03/23/22 1018     Discharge Medications: Please see discharge summary for a list of discharge medications.  Relevant Imaging Results:  Relevant Lab Results:   Additional Information SS#: [id number]  [place], LCSWA

## 2022-03-23 NOTE — Care Management Obs Status (Signed)
[place] NOTIFICATION   Patient Details  Name: [name] MRN: [id number] Date of Birth: 03-May-1946   Medicare Observation Status Notification Given:  Yes    Bartholomew Crews, RN 03/23/2022, 4:50 PM

## 2022-03-23 NOTE — Progress Notes (Signed)
Chaplain facilitated storytelling and provided prayer, emotional support.  Pt was especially interested in talking about Bible verses from her memory.    Please contact as needed for ongoing services.  [place], [place] Pager:  [phone]     03/23/22 1435  Clinical Encounter Type  Visited With Patient  Visit Type Initial;Spiritual support  Referral From Nurse  Consult/Referral To Chaplain  Spiritual Encounters  Spiritual Needs Prayer  Stress Factors  Patient Stress Factors Lack of knowledge;Health changes

## 2022-03-23 NOTE — Progress Notes (Signed)
Subjective:  Patient continues to complain of pain in her left chest associated with her rib fractures.  Discussed plan to increase her incentive spirometer use.  Discussed that patient will likely require a short stay in a skilled nursing facility.  Patient agreed with the plan.  Objective:  Vital signs in last 24 hours: Vitals:   03/22/22 1707 03/22/22 2005 03/23/22 0439 03/23/22 0859  BP: 124/66 123/69 (!) 114/55 (!) 149/72  Pulse: 68 81 78 89  Resp: '14 14 17 18  '$ Temp: 98 F (36.7 C) 98.2 F (36.8 C) 98.2 F (36.8 C) 98.8 F (37.1 C)  TempSrc:    Oral  SpO2: 96% 99% 97% 97%  Weight:      Height:       Physical Exam: Constitutional:      General: appears to be in moderate distress.     Comments: appears chronically ill.   Cardiovascular:     Rate and Rhythm: Normal rate and regular rhythm. No murmurs, rubs, or gallops.  Pulmonary:     Effort: Pulmonary effort is normal.     Breath sounds: Normal lung sounds.     Comments: Taking shallow breaths secondary to pain.  Pain with inspiration and expiration.  Difficulty auscultating the lower lung fields given the patient's limited mobility secondary to pain.  Abdominal:     General: Bowel sounds are normal. No distension or tenderness.  Musculoskeletal:     Comments: Tenderness to palpation of the left chest.  Bilateral AKA.  Assessment/Plan:  Principal Problem:   Multiple rib fractures Active Problems:   Hypothyroidism   HYPERTENSION, BENIGN SYSTEMIC   Rheumatoid arthritis (HCC)   S/P AKA (above knee amputation) bilateral (HCC)   Controlled type 2 diabetes mellitus with stable proliferative retinopathy of both eyes, with long-term current use of insulin (HCC)   ILD (interstitial lung disease) ([place])   AKI (acute kidney injury) (HCC)   [name] is a 76 y/o female with past medical history of RA (not on immunosuppressive therapy or steroids), ILD (not currently on treatment), bilateral AKA secondary to diabetic  ulcer (2021), T2DM, HTN, and wheelchair dependence that was admitted for pain control following multiple left-sided rib fractures after a fall from her wheelchair.    # Multiple rib fractures # S/p bilateral AKA # Wheelchair dependent CT chest demonstrated nondisplaced fracture of the left lateral 5th-7th ribs as well as displaced fracture of the left posterior 7th-8th ribs.  No hematoma, hemothorax, pneumothorax, pneumomediastinum.  Increased risk of developing atelectasis/pneumonia secondary to shallow breathing.  PT recommends short-term skilled nursing < 3 hours/day.  There was concern of the patient's electric wheelchair not working at home.  I spoke with [name], [place] with [facility] who stated that they are aware of the issue and working to get her an electric wheelchair that functions appropriately. - Pain control: lidocaine patch, PRN Tylenol and Oxycodone  - Continue avoiding NSAIDs due to renal dysfunction - Continue encouraging use of spirometry and flutter valve  - Will likely require SNF.    2. # AKI vs CKD Patient's baseline creatinine is unclear.  Creatinine in 12/2021 was 1.55.  Creatinine remained stable today at 1.34.  There is uncertainty on the patient's home prescription for Lasix as there is no clear indication at this time. Echo in 2019 demonstrated normal EF with LVH and mild mitral stenosis.  Patient continues to appear euvolemic. - Continue holding Lasix  - Continue encouraging PO - Will repeat BMP   3. #  RA Patient follows up with a rheumatologist but is not on immunosuppressive therapy or steroids likely secondary to comorbidities.  Patient does not appear to have RA flare currently.   4. # ILD Patient follows up with pulmonology.  She is not currently on treatment likely secondary to her comorbidities.   5. #Type 2 diabetes Patient is not currently on therapy at home.  Hemoglobin A1c is 6.2 today. -Will start sliding scale for now and reassess outpatient treatment  options.    6. # Hypertension - Continue home amlodipine 10 mg   7. # Hyperlipidemia -Continue home aspirin and rosuvastatin 20 mg   8. # Hypothyroidism TSH is < 0.01 on 8/4.  - Continue home levothyroxine 150 mcg  Diet: Dysphagia 3 IVF: None DVT: Lovenox Code: Full Code  Prior to Admission Living Arrangement: Anticipated Discharge Location: Barriers to Discharge: Dispo: Anticipated discharge in approximately more than 2 day(s).   Starlyn Skeans, MD 03/23/2022, 9:42 AM Pager: [phone] After 5pm on weekdays and 1pm on weekends: On Call pager [phone]

## 2022-03-23 NOTE — Hospital Course (Addendum)
8/5: -having some achy chest pain similar to yesterday, pain okay - mentions difficulties with wheelchair at home, states that she feels useless - discussed plan for SNF, patient agreeable - CSW for wheelchair  8/7:  Pt states they don't bring her food  Placed order to assist with feeding   Mainly complaining about no food being served   Denies fever, chills   No new symptoms   She's been using her incentive spirometer   Spirometer was around 500 first try, 100 second time   Advised pt to continue using spirometer   Put oxygen on 3L   Order put in for out of bed to chair 3x a day   Will request pillow to put underneath legs   She was living with two of her grandsons, but right now feels useless Told patient about electric wheelchair being worked on, and plans for acute rehab  States she wants to go live with her two grandsons once she is stronger (actually four grandsons)   She was staying with her niece before they redid the house  States she was happy living with her grandsons  Did not tell family that she was in the hospital   Gave consent to call family member  Can call Stanton Kidney to update, can also call brother Lissa Merlin (in contact card)    Put in aspiration precautions    8/8:  Pt is solemn/somnolent when entering the room.  Pick [place] for healthcare POA, picks backup as brother  Ordered 1 unit prbcs to improve bp.  Increased nasal cannula to 6 mL.  PE:  Lungs:Diffuse Bilateral crackles, ronchi, poor air movement GI: MSK: no erythema or warmth at amputation sites.  08/12: Pt reporting pain in her neck and back in which she is unable to sit upright in the bed. She is reporting a big appetite in which she is wanting to eat a waffle and orange juice. She has continued pain with breathing. She denies any abdominal pain. She reports some nausea. She is not having any pain in her legs.  Plan to have SLP reevaluate.  8/14: Extra heart sound but unable to  identify

## 2022-03-24 ENCOUNTER — Other Ambulatory Visit: Payer: Self-pay

## 2022-03-24 ENCOUNTER — Observation Stay (HOSPITAL_COMMUNITY): Payer: Medicare (Managed Care)

## 2022-03-24 DIAGNOSIS — E113553 Type 2 diabetes mellitus with stable proliferative diabetic retinopathy, bilateral: Secondary | ICD-10-CM | POA: Diagnosis present

## 2022-03-24 DIAGNOSIS — J849 Interstitial pulmonary disease, unspecified: Secondary | ICD-10-CM | POA: Diagnosis present

## 2022-03-24 DIAGNOSIS — J9601 Acute respiratory failure with hypoxia: Secondary | ICD-10-CM | POA: Diagnosis not present

## 2022-03-24 DIAGNOSIS — I129 Hypertensive chronic kidney disease with stage 1 through stage 4 chronic kidney disease, or unspecified chronic kidney disease: Secondary | ICD-10-CM | POA: Diagnosis present

## 2022-03-24 DIAGNOSIS — E1165 Type 2 diabetes mellitus with hyperglycemia: Secondary | ICD-10-CM | POA: Diagnosis present

## 2022-03-24 DIAGNOSIS — Z681 Body mass index (BMI) 19 or less, adult: Secondary | ICD-10-CM | POA: Diagnosis not present

## 2022-03-24 DIAGNOSIS — T17908A Unspecified foreign body in respiratory tract, part unspecified causing other injury, initial encounter: Secondary | ICD-10-CM | POA: Diagnosis not present

## 2022-03-24 DIAGNOSIS — E039 Hypothyroidism, unspecified: Secondary | ICD-10-CM | POA: Diagnosis present

## 2022-03-24 DIAGNOSIS — N179 Acute kidney failure, unspecified: Secondary | ICD-10-CM | POA: Diagnosis present

## 2022-03-24 DIAGNOSIS — J15212 Pneumonia due to Methicillin resistant Staphylococcus aureus: Secondary | ICD-10-CM | POA: Diagnosis present

## 2022-03-24 DIAGNOSIS — Z7189 Other specified counseling: Secondary | ICD-10-CM | POA: Diagnosis not present

## 2022-03-24 DIAGNOSIS — R6520 Severe sepsis without septic shock: Secondary | ICD-10-CM | POA: Diagnosis not present

## 2022-03-24 DIAGNOSIS — Z20822 Contact with and (suspected) exposure to covid-19: Secondary | ICD-10-CM | POA: Diagnosis present

## 2022-03-24 DIAGNOSIS — G9341 Metabolic encephalopathy: Secondary | ICD-10-CM | POA: Diagnosis not present

## 2022-03-24 DIAGNOSIS — D638 Anemia in other chronic diseases classified elsewhere: Secondary | ICD-10-CM | POA: Diagnosis present

## 2022-03-24 DIAGNOSIS — E1169 Type 2 diabetes mellitus with other specified complication: Secondary | ICD-10-CM | POA: Diagnosis not present

## 2022-03-24 DIAGNOSIS — Y92009 Unspecified place in unspecified non-institutional (private) residence as the place of occurrence of the external cause: Secondary | ICD-10-CM | POA: Diagnosis not present

## 2022-03-24 DIAGNOSIS — Z66 Do not resuscitate: Secondary | ICD-10-CM | POA: Diagnosis not present

## 2022-03-24 DIAGNOSIS — K921 Melena: Secondary | ICD-10-CM | POA: Diagnosis not present

## 2022-03-24 DIAGNOSIS — N189 Chronic kidney disease, unspecified: Secondary | ICD-10-CM | POA: Diagnosis not present

## 2022-03-24 DIAGNOSIS — Z515 Encounter for palliative care: Secondary | ICD-10-CM | POA: Diagnosis not present

## 2022-03-24 DIAGNOSIS — W050XXA Fall from non-moving wheelchair, initial encounter: Secondary | ICD-10-CM | POA: Diagnosis present

## 2022-03-24 DIAGNOSIS — J189 Pneumonia, unspecified organism: Secondary | ICD-10-CM | POA: Diagnosis not present

## 2022-03-24 DIAGNOSIS — Z794 Long term (current) use of insulin: Secondary | ICD-10-CM | POA: Diagnosis not present

## 2022-03-24 DIAGNOSIS — R1110 Vomiting, unspecified: Secondary | ICD-10-CM | POA: Diagnosis not present

## 2022-03-24 DIAGNOSIS — S2242XA Multiple fractures of ribs, left side, initial encounter for closed fracture: Secondary | ICD-10-CM | POA: Diagnosis present

## 2022-03-24 DIAGNOSIS — J690 Pneumonitis due to inhalation of food and vomit: Secondary | ICD-10-CM | POA: Diagnosis not present

## 2022-03-24 DIAGNOSIS — R079 Chest pain, unspecified: Secondary | ICD-10-CM | POA: Diagnosis present

## 2022-03-24 DIAGNOSIS — E43 Unspecified severe protein-calorie malnutrition: Secondary | ICD-10-CM | POA: Diagnosis present

## 2022-03-24 DIAGNOSIS — J9602 Acute respiratory failure with hypercapnia: Secondary | ICD-10-CM | POA: Diagnosis not present

## 2022-03-24 DIAGNOSIS — E875 Hyperkalemia: Secondary | ICD-10-CM | POA: Diagnosis not present

## 2022-03-24 DIAGNOSIS — M069 Rheumatoid arthritis, unspecified: Secondary | ICD-10-CM | POA: Diagnosis present

## 2022-03-24 DIAGNOSIS — N1831 Chronic kidney disease, stage 3a: Secondary | ICD-10-CM | POA: Diagnosis present

## 2022-03-24 DIAGNOSIS — F21 Schizotypal disorder: Secondary | ICD-10-CM | POA: Diagnosis present

## 2022-03-24 DIAGNOSIS — I700 Atherosclerosis of aorta: Secondary | ICD-10-CM | POA: Diagnosis present

## 2022-03-24 DIAGNOSIS — D500 Iron deficiency anemia secondary to blood loss (chronic): Secondary | ICD-10-CM | POA: Diagnosis not present

## 2022-03-24 DIAGNOSIS — A419 Sepsis, unspecified organism: Secondary | ICD-10-CM | POA: Diagnosis not present

## 2022-03-24 LAB — GLUCOSE, CAPILLARY
Glucose-Capillary: 159 mg/dL — ABNORMAL HIGH (ref 70–99)
Glucose-Capillary: 133 mg/dL — ABNORMAL HIGH (ref 70–99)
Glucose-Capillary: 151 mg/dL — ABNORMAL HIGH (ref 70–99)
Glucose-Capillary: 147 mg/dL — ABNORMAL HIGH (ref 70–99)

## 2022-03-24 LAB — BASIC METABOLIC PANEL
Anion gap: 5 (ref 5–15)
BUN: 40 mg/dL — ABNORMAL HIGH (ref 8–23)
CO2: 24 mmol/L (ref 22–32)
Calcium: 8.6 mg/dL — ABNORMAL LOW (ref 8.9–10.3)
Chloride: 108 mmol/L (ref 98–111)
Creatinine, Ser: 1.36 mg/dL — ABNORMAL HIGH (ref 0.44–1.00)
GFR, Estimated: 40 mL/min — ABNORMAL LOW (ref >60)
Glucose, Bld: 99 mg/dL (ref 70–99)
Potassium: 4.7 mmol/L (ref 3.5–5.1)
Sodium: 137 mmol/L (ref 135–145)

## 2022-03-24 MED ORDER — TRAZODONE HCL 50 MG PO TABS
25.0000 mg | ORAL_TABLET | Freq: Every day | ORAL | Status: DC
  Administered 2022-03-24 – 2022-03-25 (×2): 25 mg via ORAL
  Filled 2022-03-24 (×2): qty 1

## 2022-03-24 MED ORDER — ONDANSETRON HCL 4 MG/2ML IJ SOLN
4.0000 mg | Freq: Four times a day (QID) | INTRAMUSCULAR | Status: DC | PRN
Start: 2022-03-24 — End: 2022-04-04
  Administered 2022-03-29 – 2022-04-01 (×5): 4 mg via INTRAVENOUS
  Filled 2022-03-24 (×6): qty 2

## 2022-03-24 MED ORDER — HYDROMORPHONE HCL 2 MG PO TABS: 1.0000 mg | ORAL_TABLET | ORAL | Status: DC | PRN

## 2022-03-24 NOTE — Progress Notes (Signed)
Subjective:  Patient is still complaining of pain in her left chest associated with her rib fractures.  She states that she has been using her incentive spirometer frequently and at least 10 times yesterday.  Patient states that she is having difficulty sleeping in the hospital.  Discussed that the patient will need to go to a skilled nursing facility.  Objective:  Vital signs in last 24 hours: Vitals:   03/23/22 1527 03/23/22 2045 03/24/22 0453 03/24/22 1357  BP: 122/66 (!) 143/69 116/62 (!) 122/57  Pulse: 84 76 82 81  Resp: '18 16 17 17  '$ Temp: 98.3 F (36.8 C) 98.2 F (36.8 C) 97.8 F (36.6 C) 98.5 F (36.9 C)  TempSrc: Oral   Oral  SpO2: 100% 99% 97% 96%  Weight:      Height:       Physical Exam: Constitutional:      General: Not in acute distress.    Comments: chronically-ill appearance.   Cardiovascular:     Rate and Rhythm: Normal rate and regular rhythm. No murmurs, rubs, or gallops.  Pulmonary:     Effort: Pulmonary effort is normal.     Comments: Taking shallow breaths secondary to pain.  Pain with inspiration/expiration.  Coarse lung sounds in bilateral lower lung fields.   Abdominal:     General: Bowel sounds are normal. No distension or tenderness.  Musculoskeletal:     Comments: Tenderness to palpation of the left chest.  Bilateral AKA.  Assessment/Plan:  Principal Problem:   Multiple rib fractures Active Problems:   Hypothyroidism   HYPERTENSION, BENIGN SYSTEMIC   Rheumatoid arthritis (HCC)   S/P AKA (above knee amputation) bilateral (HCC)   Controlled type 2 diabetes mellitus with stable proliferative retinopathy of both eyes, with long-term current use of insulin (HCC)   ILD (interstitial lung disease) ([place])   AKI (acute kidney injury) (HCC)   [name] is a 76 y/o female with past medical history of RA (not on immunosuppressive therapy or steroids), ILD (not currently on treatment), bilateral AKA secondary to diabetic ulcer (2021), T2DM, HTN, and  wheelchair dependence that was admitted for pain control following multiple left-sided rib fractures after a fall from her wheelchair.    # Multiple rib fractures # S/p bilateral AKA # Wheelchair dependent CT chest demonstrated nondisplaced fracture of the left lateral 5th-7th ribs as well as displaced fracture of the left posterior 7th-8th ribs. Increased risk of atelectasis/pneumonia due to shallow breathing.  Chest x-ray was ordered due to coarse breath sounds.  Chest x-ray demonstrated interstitial opacities in the lower lungs left > right spacious of chronic fibrosis as well as small area of hazy airspace opacity in the right upper lobe suspected to be infectious/inflammatory in origin.  - Lidocaine patch, PRN Tylenol, Oxycodone 0.5 mg q6 PRN for pain control - 1 mg PO dilaudid q4 PRN for break-through pain - Encourage use of spirometry and flutter valve  - Will reevaluate pulmonary exam tomorrow - SNF following discharge   2. # AKI vs CKD Unclear baseline creatinine. Creatinine 1.55 in 12/2021.  Creatinine remained stable today at 1.36.  Patient appears euvolemic. - Will continue encouraging PO  3. # RA Not currently on immunosuppressive therapy or steroids.  Patient has follow-up with rheumatology.  No active RA flare.   4. # ILD Not currently on treatment.  Patient has follow-up with pulmonology.    5. #Type 2 diabetes Hemoglobin A1c 6.2 on admission.  Glucose improved with initiation of sliding scale on  8/5.  - Continue sliding scale -Continue monitoring CBG   6. # Hypertension - Will continue home amlodipine 10 mg   7. # Hyperlipidemia - Will continue home aspirin and rosuvastatin 20 mg   8. # Hypothyroidism TSH is < 0.01 on 8/4.  - Will continue home levothyroxine 150 mcg   Diet: Dysphagia 3 IVF: None DVT: Lovenox Code: Full Code  Prior to Admission Living Arrangement: home Anticipated Discharge Location: SNF Barriers to Discharge: continued management Dispo:  Anticipated discharge in approximately in less than 2 day(s).   Starlyn Skeans, MD 03/24/2022, 4:39 PM Pager: [phone] After 5pm on weekdays and 1pm on weekends: On Call pager [phone]

## 2022-03-24 NOTE — TOC CAGE-AID Note (Signed)
Transition of Care [facility]) - CAGE-AID Screening   Patient Details  Name: [name] MRN: [id number] Date of Birth: 1946/02/20  Clinical Narrative:  Patient admitted after a fall causing multiple rib fractures. Patient unable to participate in screening at this time.  CAGE-AID Screening: Substance Abuse Screening unable to be completed due to: : Patient unable to participate

## 2022-03-25 DIAGNOSIS — Z89611 Acquired absence of right leg above knee: Secondary | ICD-10-CM

## 2022-03-25 DIAGNOSIS — S2242XA Multiple fractures of ribs, left side, initial encounter for closed fracture: Secondary | ICD-10-CM

## 2022-03-25 DIAGNOSIS — Z794 Long term (current) use of insulin: Secondary | ICD-10-CM

## 2022-03-25 DIAGNOSIS — E113553 Type 2 diabetes mellitus with stable proliferative diabetic retinopathy, bilateral: Secondary | ICD-10-CM

## 2022-03-25 DIAGNOSIS — Z89612 Acquired absence of left leg above knee: Secondary | ICD-10-CM

## 2022-03-25 DIAGNOSIS — Z993 Dependence on wheelchair: Secondary | ICD-10-CM

## 2022-03-25 DIAGNOSIS — N189 Chronic kidney disease, unspecified: Secondary | ICD-10-CM | POA: Diagnosis not present

## 2022-03-25 DIAGNOSIS — J9601 Acute respiratory failure with hypoxia: Secondary | ICD-10-CM

## 2022-03-25 DIAGNOSIS — N179 Acute kidney failure, unspecified: Secondary | ICD-10-CM | POA: Diagnosis not present

## 2022-03-25 LAB — CBC
HCT: 24.9 % — ABNORMAL LOW (ref 36.0–46.0)
Hemoglobin: 7.2 g/dL — ABNORMAL LOW (ref 12.0–15.0)
MCH: 27.8 pg (ref 26.0–34.0)
MCHC: 28.9 g/dL — ABNORMAL LOW (ref 30.0–36.0)
MCV: 96.1 fL (ref 80.0–100.0)
Platelets: 258 10*3/uL (ref 150–400)
RBC: 2.59 MIL/uL — ABNORMAL LOW (ref 3.87–5.11)
RDW: 13.8 % (ref 11.5–15.5)
WBC: 17.6 10*3/uL — ABNORMAL HIGH (ref 4.0–10.5)
nRBC: 0 % (ref 0.0–0.2)

## 2022-03-25 LAB — BASIC METABOLIC PANEL
Anion gap: 4 — ABNORMAL LOW (ref 5–15)
BUN: 53 mg/dL — ABNORMAL HIGH (ref 8–23)
CO2: 24 mmol/L (ref 22–32)
Calcium: 8.6 mg/dL — ABNORMAL LOW (ref 8.9–10.3)
Chloride: 111 mmol/L (ref 98–111)
Creatinine, Ser: 1.72 mg/dL — ABNORMAL HIGH (ref 0.44–1.00)
GFR, Estimated: 30 mL/min — ABNORMAL LOW (ref >60)
Glucose, Bld: 86 mg/dL (ref 70–99)
Potassium: 5 mmol/L (ref 3.5–5.1)
Sodium: 139 mmol/L (ref 135–145)

## 2022-03-25 LAB — PROCALCITONIN: Procalcitonin: 0.16 ng/mL

## 2022-03-25 LAB — GLUCOSE, CAPILLARY
Glucose-Capillary: 92 mg/dL (ref 70–99)
Glucose-Capillary: 151 mg/dL — ABNORMAL HIGH (ref 70–99)
Glucose-Capillary: 178 mg/dL — ABNORMAL HIGH (ref 70–99)
Glucose-Capillary: 96 mg/dL (ref 70–99)

## 2022-03-25 MED ORDER — OXYCODONE HCL 5 MG PO TABS: 2.5000 mg | ORAL_TABLET | ORAL | Status: DC | PRN

## 2022-03-25 MED ORDER — SODIUM CHLORIDE 0.9 % IV BOLUS
500.0000 mL | Freq: Once | INTRAVENOUS | Status: AC
Start: 2022-03-25 — End: 2022-03-25
  Administered 2022-03-25: 500 mL via INTRAVENOUS

## 2022-03-25 MED ORDER — OXYCODONE HCL 5 MG PO TABS
2.5000 mg | ORAL_TABLET | ORAL | Status: DC
  Administered 2022-03-25 – 2022-03-26 (×5): 2.5 mg via ORAL
  Filled 2022-03-25 (×5): qty 1

## 2022-03-25 NOTE — Plan of Care (Signed)
  Problem: Health Behavior/Discharge Planning: Goal: Ability to manage health-related needs will improve Outcome: Not Progressing   Problem: Clinical Measurements: Goal: Ability to maintain clinical measurements within normal limits will improve Outcome: Not Progressing Goal: Respiratory complications will improve Outcome: Not Progressing

## 2022-03-25 NOTE — Progress Notes (Signed)
Subjective:  Patient feels as though she is not being brought enough meals throughout the day.  Order was placed to assist with feeding.  Patient still complaining of pain in her left chest associated with her rib fractures. Patient denies fever, chills, new symptoms. She states that she has been using her incentive spirometer as directed.  Advised to continue using incentive spirometer.  Discussed plans for SNF and asked about her home situation.  Patient states that she was living with her grandsons but is unsure of where she will go after the SNF.  She is still concerned about her electric wheelchair not working at home.  She states that she is comfortable with [place] speaking with her brother and sister about her condition and plans moving forward.  Objective:  Vital signs in last 24 hours: Vitals:   03/24/22 2113 03/24/22 2324 03/25/22 0530 03/25/22 0821  BP: (!) 98/52 129/63 (!) 112/57 (!) 101/53  Pulse: 87 86 87 76  Resp: 16 18 (!) 24 18  Temp: 98.4 F (36.9 C)  98.6 F (37 C) 98.9 F (37.2 C)  TempSrc: Oral   Oral  SpO2: 91% 100% 96% 94%  Weight:      Height:       Physical Exam: Constitutional:      General: Not in acute distress.    Comments: appears chronically-ill Cardiovascular:     Rate and Rhythm: Normal rate and regular rhythm. No murmurs, rubs, or gallops.  Pulmonary:     Effort: Pulmonary effort is normal.     Comments: Shallow breaths due to pain with inspiration/expiration. Normal lungs sounds bilaterally. Abdominal:     General: Bowel sounds are normal. No distension or tenderness.  Musculoskeletal:     Comments: Moderate tenderness to palpation of the left chest.  Bilateral AKA.  Assessment/Plan:  Principal Problem:   Multiple rib fractures Active Problems:   Hypothyroidism   HYPERTENSION, BENIGN SYSTEMIC   Rheumatoid arthritis (HCC)   S/P AKA (above knee amputation) bilateral (HCC)   Controlled type 2 diabetes mellitus with stable proliferative  retinopathy of both eyes, with long-term current use of insulin (HCC)   ILD (interstitial lung disease) ([place])   AKI (acute kidney injury) ([place])   Acute respiratory failure with hypoxia (HCC)   [name] is a 76 y/o female with past medical history of RA (not on immunosuppressive therapy or steroids), ILD (not currently on treatment), bilateral AKA secondary to diabetic ulcer (2021), T2DM, HTN, and wheelchair dependence that was admitted for pain control of multiple left-sided rib fractures after a fall from her wheelchair.    # Multiple rib fractures # S/p bilateral AKA # Wheelchair dependent CT chest demonstrated nondisplaced fracture of the left lateral 5th-7th ribs as well as displaced fracture of the left posterior 7th-8th ribs.  Patient is at increased risk of developing pneumonia due to shallow breathing. Chest x-ray on 8/6 demonstrated interstitial opacities in the lower lungs left > right spacious of chronic fibrosis as well as small area of hazy airspace opacity in the right upper lobe suspected to be infectious/inflammatory in origin.  Patient's WBC count increased today at 17.6 up from 10.9 yesterday. Patient presentation could potentially be early pneumonia.  Spoke with [facility] and SW that confirmed that the patient has a bed at [facility] for SNF. [facility] still working on getting the patients electric wheelchair functional. It is my understanding that the patient is unfit to go home with her grandsons as APS is involved.  I attempted to call the patient's brother and sister on file without answer.  I left a voicemail stating to call back.  I spoke with [facility] and recommended that they also attempt to contact the brother and sister in order to coordinate a place for her to stay following her SNF stay. - Procalcitonin ordered - RVP ordered - Covid swab ordered - Potentially initiate antibiotics following results of labs and reassessment of patient presentation - Pain control:  lidocaine patch q 12, tylenol q8, oxycodone 2.5 mg q4, oxycodone 2.5 mg q4 PRN for breakthrough pain - Continue encouraging use of spirometry and flutter valve  - Order placed to have the patient helped to get up from bed and into her chair X3 daily - Will reevaluate pulmonary exam tomorrow - SNF following discharge   2. # AKI vs CKD Unsure of baseline creatinine. Creatinine 1.55 in 12/2021.  Increased to 1.72. Patient remains euvolemic in appearance. I suspect that this is secondary to the patient not eating well while hospitalized.  - 500 cc 0.9% NaCl bolus ordered - Continue encouraging PO   3. # Nutrition Patient states that she is not being brought food enough throughout the day.  - Order placed for assisted feeding - Placed on aspiration precautions - Swallow eval ordered   4. # RA Not currently on immunosuppressive therapy or steroids.  Patient follows up with rheumatology.  No active RA flare at this time.   5. # ILD Not currently on treatment.  Patient  follows up with pulmonology.    6. #Type 2 diabetes Hemoglobin A1c 6.2 on admission.  Glucose remains < 180.  - Continue sliding scale - Continue CBG monitoring   7. # Hypertension - Continue home amlodipine 10 mg   8. # Hyperlipidemia - Continue home aspirin and rosuvastatin 20 mg   8. # Hypothyroidism TSH was < 0.01 on 8/4.  - Continue home levothyroxine 150 mcg   Diet: Dysphagia 3 IVF: None DVT: Lovenox Code: Full Code   Prior to Admission Living Arrangement: home with grandsons Anticipated Discharge Location: SNF, potentially brother or sister afterwards Barriers to Discharge: continued management Dispo: Anticipated discharge in approximately in less than 2 day(s).     Starlyn Skeans, MD 03/25/2022, 11:33 AM Pager: [phone] After 5pm on weekdays and 1pm on weekends: On Call pager [phone]

## 2022-03-25 NOTE — TOC Progression Note (Addendum)
Transition of Care [facility] - Progression Note    Patient Details  Name: KINYA MEINE MRN: [id number] Date of Birth: Dec 19, 1945  Transition of [facility]) CM/SW Contact  Joanne Chars, LCSW Phone Number: 03/25/2022, 10:45 AM  Clinical Narrative:   Bed offers presented to pt, who did appear able to engage in discussion about choosing SNF facility.  She said she has been at [facility] before, discussed that they do not have bed currently.  She would like to accept offer at [facility].  Permission given to speak with all of her grandsons, she asked CSW to call Jimmie Molly first.  CSW attempted to contact grandson Jimmie Molly, voicemail not set up, sent text requesting call.  CSW LM with grandson Deontay.  [facility] confirmed that they do have available bed today.  1040: grandson Jimmie Molly called back, was aware of plan for SNF, agreeable to [place] as well.  Pt lives at home with 2 grandsons, [place] and [place].      Expected Discharge Plan: [place] Barriers to Discharge: No Barriers Identified  Expected Discharge Plan and Services Expected Discharge Plan: [place]: [place] arrangements for the past 2 months: [place]                                       Social Determinants of Health (SDOH) Interventions    Readmission Risk Interventions     No data to display

## 2022-03-25 NOTE — Evaluation (Signed)
Clinical/Bedside Swallow Evaluation Patient Details  Name: [name] MRN: [id number] Date of Birth: February 04, 1946  Today's Date: 03/25/2022 Time: SLP Start Time (ACUTE ONLY): 1218 SLP Stop Time (ACUTE ONLY): 1235 SLP Time Calculation (min) (ACUTE ONLY): 17 min  Past Medical History:  Past Medical History:  Diagnosis Date   Bilateral lower extremity edema 06/06/2014   Cataracts, bilateral 02/13/2011   Seen on eye exam 02/07/11. F/u in 12 months    Diabetes mellitus age 6   Diabetic ulcer of toe ([place]) 12/12/2015   Hyperlipidemia    Hypertension    Retinal detachment, old, partial    left   Retinopathy due to secondary diabetes mellitus ([place])    L>R, laser 3/07   Schizotypal personality disorder ([place])    Thyroid disease    Weight loss 01/22/2012   Past Surgical History:  Past Surgical History:  Procedure Laterality Date   ABDOMINAL AORTOGRAM W/LOWER EXTREMITY N/A 07/23/2018   Procedure: ABDOMINAL AORTOGRAM W/LOWER EXTREMITY;  Surgeon: Marty Heck, MD;  Location: [place] CV LAB;  Service: Cardiovascular;  Laterality: N/A;   ABOVE KNEE LEG AMPUTATION Bilateral 09/27/2019   AMPUTATION Bilateral 09/27/2019   Procedure: AMPUTATION ABOVE KNEE;  Surgeon: Angelia Mould, MD;  Location: [facility] OR;  Service: Vascular;  Laterality: Bilateral;   BREAST EXCISIONAL BIOPSY Bilateral    BREAST SURGERY     CATARACT EXTRACTION     left    EYE SURGERY     ROTATOR CUFF REPAIR  1990's   left   Thyroid radiation ablation     for Graves Disease   TRANSTHORACIC ECHOCARDIOGRAM      EF55-65%, nml - 12/14/2004   HPI:  Patient is a 76 y/o female who presents with left rib fxs 5-8 s/p fall from her w/c. CXR 8/6 with hazy opacities and WBC increased 8/7, concerning for early PNA. PMH includes ILD, RA, bil AKAs, schizotypal personality, HTN, DM, retinal detachment, thyroid disease.    Assessment / Plan / Recommendation  Clinical Impression  Pt has audible congestion at baseline, and although  it remains present throughout PO triakls, it does not appear to change or worsen. Pt endorses some difficulty with mastication without access to her lower dentures, but she believes that she can adequate chew her mechanical soft diet as long as she also has a liquid wash. Pt appeared to be functional with bites of graham cracker provided, and she has no overt s/s of aspiration on any consistency. Pt's mentation, positioning, and reduced cough in light of rib fxs could put her at an increased risk for aspiration though, so SLP will f/u at least briefly. SLP Visit Diagnosis: Dysphagia, unspecified (R13.10)    Aspiration Risk  Mild aspiration risk    Diet Recommendation Dysphagia 3 (Mech soft);Thin liquid   Liquid Administration via: Cup;Straw Medication Administration: Whole meds with liquid Supervision: Staff to assist with self feeding Compensations: Minimize environmental distractions;Small sips/bites;Slow rate Postural Changes: Seated upright at 90 degrees    Other  Recommendations Oral Care Recommendations: Oral care BID    Recommendations for follow up therapy are one component of a multi-disciplinary discharge planning process, led by the attending physician.  Recommendations may be updated based on patient status, additional functional criteria and insurance authorization.  Follow up Recommendations No SLP follow up      Assistance Recommended at Discharge PRN  Functional Status Assessment Patient has had a recent decline in their functional status and demonstrates the ability to make significant improvements in function  in a reasonable and predictable amount of time.  Frequency and Duration min 2x/week  1 week       Prognosis Prognosis for Safe Diet Advancement: Good Barriers to Reach Goals: Cognitive deficits      Swallow Study   General HPI: Patient is a 76 y/o female who presents with left rib fxs 5-8 s/p fall from her w/c. CXR 8/6 with hazy opacities and WBC increased  8/7, concerning for early PNA. PMH includes ILD, RA, bil AKAs, schizotypal personality, HTN, DM, retinal detachment, thyroid disease. Type of Study: Bedside Swallow Evaluation Previous Swallow Assessment: none in chart Diet Prior to this Study: Dysphagia 3 (soft);Thin liquids Temperature Spikes Noted: No Respiratory Status: Nasal cannula History of Recent Intubation: No Behavior/Cognition: Alert;Cooperative;Requires cueing Oral Cavity Assessment: Within Functional Limits Oral Care Completed by SLP: No Oral Cavity - Dentition: Dentures, top;Dentures, not available (lower dentures not available) Self-Feeding Abilities: Needs assist Patient Positioning: Postural control adequate for testing Baseline Vocal Quality: Normal Volitional Swallow: Able to elicit    Oral/Motor/Sensory Function Overall Oral Motor/Sensory Function: Within functional limits (somewhat reduced command following but appears to be [facility])   [employer] chips: Not tested   Thin Liquid Thin Liquid: Within functional limits Presentation: Straw    Nectar Thick Nectar Thick Liquid: Not tested   Honey Thick Honey Thick Liquid: Not tested   Puree Puree: Within functional limits Presentation: Spoon   Solid     Solid: Within functional limits      Osie Bond., M.A. [place] Office [phone]  Secure chat preferred  03/25/2022,1:09 PM

## 2022-03-26 ENCOUNTER — Inpatient Hospital Stay (HOSPITAL_COMMUNITY): Payer: Medicare (Managed Care)

## 2022-03-26 DIAGNOSIS — D649 Anemia, unspecified: Secondary | ICD-10-CM

## 2022-03-26 DIAGNOSIS — J849 Interstitial pulmonary disease, unspecified: Secondary | ICD-10-CM

## 2022-03-26 DIAGNOSIS — T17908A Unspecified foreign body in respiratory tract, part unspecified causing other injury, initial encounter: Secondary | ICD-10-CM

## 2022-03-26 DIAGNOSIS — J9601 Acute respiratory failure with hypoxia: Secondary | ICD-10-CM | POA: Diagnosis not present

## 2022-03-26 DIAGNOSIS — E875 Hyperkalemia: Secondary | ICD-10-CM

## 2022-03-26 DIAGNOSIS — J189 Pneumonia, unspecified organism: Secondary | ICD-10-CM

## 2022-03-26 DIAGNOSIS — R633 Feeding difficulties, unspecified: Secondary | ICD-10-CM

## 2022-03-26 DIAGNOSIS — S2242XA Multiple fractures of ribs, left side, initial encounter for closed fracture: Secondary | ICD-10-CM | POA: Diagnosis not present

## 2022-03-26 LAB — POCT I-STAT 7, (LYTES, BLD GAS, ICA,H+H)
Acid-base deficit: 4 mmol/L — ABNORMAL HIGH (ref 0.0–2.0)
Acid-base deficit: 2 mmol/L (ref 0.0–2.0)
Bicarbonate: 24 mmol/L (ref 20.0–28.0)
Bicarbonate: 23.6 mmol/L (ref 20.0–28.0)
Calcium, Ion: 1.31 mmol/L (ref 1.15–1.40)
Calcium, Ion: 1.26 mmol/L (ref 1.15–1.40)
HCT: 27 % — ABNORMAL LOW (ref 36.0–46.0)
HCT: 35 % — ABNORMAL LOW (ref 36.0–46.0)
Hemoglobin: 9.2 g/dL — ABNORMAL LOW (ref 12.0–15.0)
Hemoglobin: 11.9 g/dL — ABNORMAL LOW (ref 12.0–15.0)
O2 Saturation: 93 %
O2 Saturation: 100 %
Patient temperature: 98.4
Patient temperature: 98.5
Potassium: 5.8 mmol/L — ABNORMAL HIGH (ref 3.5–5.1)
Potassium: 5.9 mmol/L — ABNORMAL HIGH (ref 3.5–5.1)
Sodium: 140 mmol/L (ref 135–145)
Sodium: 139 mmol/L (ref 135–145)
TCO2: 26 mmol/L (ref 22–32)
TCO2: 25 mmol/L (ref 22–32)
pCO2 arterial: 58.1 mmHg — ABNORMAL HIGH (ref 32–48)
pCO2 arterial: 43.3 mmHg (ref 32–48)
pH, Arterial: 7.222 — ABNORMAL LOW (ref 7.35–7.45)
pH, Arterial: 7.345 — ABNORMAL LOW (ref 7.35–7.45)
pO2, Arterial: 83 mmHg (ref 83–108)
pO2, Arterial: 247 mmHg — ABNORMAL HIGH (ref 83–108)

## 2022-03-26 LAB — GLUCOSE, CAPILLARY
Glucose-Capillary: 122 mg/dL — ABNORMAL HIGH (ref 70–99)
Glucose-Capillary: 165 mg/dL — ABNORMAL HIGH (ref 70–99)
Glucose-Capillary: 116 mg/dL — ABNORMAL HIGH (ref 70–99)
Glucose-Capillary: 147 mg/dL — ABNORMAL HIGH (ref 70–99)
Glucose-Capillary: 68 mg/dL — ABNORMAL LOW (ref 70–99)

## 2022-03-26 LAB — RESPIRATORY PANEL BY PCR

## 2022-03-26 LAB — CBC
HCT: 24.1 % — ABNORMAL LOW (ref 36.0–46.0)
HCT: 28.3 % — ABNORMAL LOW (ref 36.0–46.0)
Hemoglobin: 6.9 g/dL — CL (ref 12.0–15.0)
Hemoglobin: 8.7 g/dL — ABNORMAL LOW (ref 12.0–15.0)
MCH: 28 pg (ref 26.0–34.0)
MCH: 28.5 pg (ref 26.0–34.0)
MCHC: 28.6 g/dL — ABNORMAL LOW (ref 30.0–36.0)
MCHC: 30.7 g/dL (ref 30.0–36.0)
MCV: 98 fL (ref 80.0–100.0)
MCV: 92.8 fL (ref 80.0–100.0)
Platelets: 242 10*3/uL (ref 150–400)
Platelets: 227 10*3/uL (ref 150–400)
RBC: 2.46 MIL/uL — ABNORMAL LOW (ref 3.87–5.11)
RBC: 3.05 MIL/uL — ABNORMAL LOW (ref 3.87–5.11)
RDW: 14.1 % (ref 11.5–15.5)
RDW: 14.5 % (ref 11.5–15.5)
WBC: 25.3 10*3/uL — ABNORMAL HIGH (ref 4.0–10.5)
WBC: 21.6 10*3/uL — ABNORMAL HIGH (ref 4.0–10.5)
nRBC: 0 % (ref 0.0–0.2)
nRBC: 0 % (ref 0.0–0.2)

## 2022-03-26 LAB — BASIC METABOLIC PANEL
Anion gap: 6 (ref 5–15)
Anion gap: 6 (ref 5–15)
BUN: 61 mg/dL — ABNORMAL HIGH (ref 8–23)
BUN: 65 mg/dL — ABNORMAL HIGH (ref 8–23)
CO2: 21 mmol/L — ABNORMAL LOW (ref 22–32)
CO2: 21 mmol/L — ABNORMAL LOW (ref 22–32)
Calcium: 8.5 mg/dL — ABNORMAL LOW (ref 8.9–10.3)
Calcium: 8.5 mg/dL — ABNORMAL LOW (ref 8.9–10.3)
Chloride: 112 mmol/L — ABNORMAL HIGH (ref 98–111)
Chloride: 110 mmol/L (ref 98–111)
Creatinine, Ser: 1.99 mg/dL — ABNORMAL HIGH (ref 0.44–1.00)
Creatinine, Ser: 1.93 mg/dL — ABNORMAL HIGH (ref 0.44–1.00)
GFR, Estimated: 26 mL/min — ABNORMAL LOW (ref >60)
GFR, Estimated: 27 mL/min — ABNORMAL LOW (ref >60)
Glucose, Bld: 128 mg/dL — ABNORMAL HIGH (ref 70–99)
Glucose, Bld: 159 mg/dL — ABNORMAL HIGH (ref 70–99)
Potassium: 5.3 mmol/L — ABNORMAL HIGH (ref 3.5–5.1)
Potassium: 5.8 mmol/L — ABNORMAL HIGH (ref 3.5–5.1)
Sodium: 139 mmol/L (ref 135–145)
Sodium: 137 mmol/L (ref 135–145)

## 2022-03-26 LAB — MRSA NEXT GEN BY PCR, NASAL: MRSA by PCR Next Gen: DETECTED — AB

## 2022-03-26 LAB — PREPARE RBC (CROSSMATCH)

## 2022-03-26 MED ORDER — VANCOMYCIN HCL 750 MG/150ML IV SOLN
750.0000 mg | Freq: Once | INTRAVENOUS | Status: DC
  Filled 2022-03-26: qty 150

## 2022-03-26 MED ORDER — PHENYLEPHRINE 80 MCG/ML (10ML) SYRINGE FOR IV PUSH (FOR BLOOD PRESSURE SUPPORT)
PREFILLED_SYRINGE | INTRAVENOUS | Status: AC
  Filled 2022-03-26: qty 10

## 2022-03-26 MED ORDER — CHLORHEXIDINE GLUCONATE CLOTH 2 % EX PADS
6.0000 | MEDICATED_PAD | Freq: Every day | CUTANEOUS | Status: AC
  Administered 2022-03-27 – 2022-03-30 (×5): 6 via TOPICAL

## 2022-03-26 MED ORDER — SODIUM CHLORIDE 0.9 % IV SOLN
1.5000 g | Freq: Three times a day (TID) | INTRAVENOUS | Status: DC
  Filled 2022-03-26 (×2): qty 4

## 2022-03-26 MED ORDER — IPRATROPIUM-ALBUTEROL 0.5-2.5 (3) MG/3ML IN SOLN: 3.0000 mL | Freq: Four times a day (QID) | RESPIRATORY_TRACT | Status: DC | PRN

## 2022-03-26 MED ORDER — VANCOMYCIN HCL 500 MG/100ML IV SOLN
500.0000 mg | Freq: Once | INTRAVENOUS | Status: DC
Start: 2022-03-26 — End: 2022-03-26
  Filled 2022-03-26: qty 100

## 2022-03-26 MED ORDER — PANTOPRAZOLE 2 MG/ML SUSPENSION
40.0000 mg | Freq: Every day | ORAL | Status: DC
Start: 2022-03-26 — End: 2022-03-28
  Administered 2022-03-26 – 2022-03-28 (×3): 40 mg
  Filled 2022-03-26 (×3): qty 20

## 2022-03-26 MED ORDER — METRONIDAZOLE 500 MG/100ML IV SOLN
500.0000 mg | Freq: Two times a day (BID) | INTRAVENOUS | Status: DC
Start: 2022-03-26 — End: 2022-03-26

## 2022-03-26 MED ORDER — MUPIROCIN 2 % EX OINT
1.0000 | TOPICAL_OINTMENT | Freq: Two times a day (BID) | CUTANEOUS | Status: AC
  Administered 2022-03-26 – 2022-03-31 (×10): 1 via NASAL
  Filled 2022-03-26 (×3): qty 22

## 2022-03-26 MED ORDER — SODIUM ZIRCONIUM CYCLOSILICATE 10 G PO PACK
10.0000 g | PACK | Freq: Once | ORAL | Status: AC
  Administered 2022-03-26: 10 g
  Filled 2022-03-26: qty 1

## 2022-03-26 MED ORDER — SODIUM CHLORIDE 0.9 % IV SOLN
1.5000 g | Freq: Two times a day (BID) | INTRAVENOUS | Status: DC
  Administered 2022-03-26 – 2022-03-28 (×4): 1.5 g via INTRAVENOUS
  Filled 2022-03-26 (×5): qty 4

## 2022-03-26 MED ORDER — PANTOPRAZOLE 2 MG/ML SUSPENSION
40.0000 mg | Freq: Every day | ORAL | Status: DC
  Filled 2022-03-26: qty 20

## 2022-03-26 MED ORDER — KETAMINE HCL 50 MG/5ML IJ SOSY
PREFILLED_SYRINGE | INTRAMUSCULAR | Status: AC
  Filled 2022-03-26: qty 5

## 2022-03-26 MED ORDER — ETOMIDATE 2 MG/ML IV SOLN
INTRAVENOUS | Status: DC
Start: 2022-03-26 — End: 2022-03-26
  Filled 2022-03-26: qty 20

## 2022-03-26 MED ORDER — ACETAMINOPHEN 160 MG/5ML PO SOLN
1000.0000 mg | Freq: Three times a day (TID) | ORAL | Status: DC
  Administered 2022-03-26 – 2022-04-01 (×15): 1000 mg
  Filled 2022-03-26 (×14): qty 40.6

## 2022-03-26 MED ORDER — LEVOTHYROXINE SODIUM 75 MCG PO TABS
150.0000 ug | ORAL_TABLET | Freq: Every day | ORAL | Status: DC
Start: 2022-03-27 — End: 2022-04-01
  Administered 2022-03-27 – 2022-04-01 (×6): 150 ug
  Filled 2022-03-26 (×6): qty 2

## 2022-03-26 MED ORDER — SUCCINYLCHOLINE CHLORIDE 200 MG/10ML IV SOSY
PREFILLED_SYRINGE | INTRAVENOUS | Status: DC
Start: 2022-03-26 — End: 2022-03-26
  Filled 2022-03-26: qty 10

## 2022-03-26 MED ORDER — MIDAZOLAM HCL 2 MG/2ML IJ SOLN
INTRAMUSCULAR | Status: AC
  Administered 2022-03-26: 1 mg
  Filled 2022-03-26: qty 2

## 2022-03-26 MED ORDER — INSULIN ASPART 100 UNIT/ML IJ SOLN
0.0000 [IU] | INTRAMUSCULAR | Status: DC
  Administered 2022-03-26 – 2022-03-27 (×2): 1 [IU] via SUBCUTANEOUS
  Administered 2022-03-27: 2 [IU] via SUBCUTANEOUS
  Administered 2022-03-27: 1 [IU] via SUBCUTANEOUS
  Administered 2022-03-28: 3 [IU] via SUBCUTANEOUS
  Administered 2022-03-28: 2 [IU] via SUBCUTANEOUS
  Administered 2022-03-28 (×3): 3 [IU] via SUBCUTANEOUS
  Administered 2022-03-28 (×2): 7 [IU] via SUBCUTANEOUS
  Administered 2022-03-29: 1 [IU] via SUBCUTANEOUS
  Administered 2022-03-29: 2 [IU] via SUBCUTANEOUS
  Administered 2022-03-29 (×3): 1 [IU] via SUBCUTANEOUS
  Administered 2022-03-30: 3 [IU] via SUBCUTANEOUS
  Administered 2022-03-30 (×3): 2 [IU] via SUBCUTANEOUS
  Administered 2022-03-31: 1 [IU] via SUBCUTANEOUS
  Administered 2022-03-31 (×2): 2 [IU] via SUBCUTANEOUS
  Administered 2022-04-01 (×2): 1 [IU] via SUBCUTANEOUS
  Administered 2022-04-01: 2 [IU] via SUBCUTANEOUS
  Administered 2022-04-02 – 2022-04-03 (×2): 3 [IU] via SUBCUTANEOUS
  Administered 2022-04-03: 2 [IU] via SUBCUTANEOUS

## 2022-03-26 MED ORDER — DOCUSATE SODIUM 50 MG/5ML PO LIQD
100.0000 mg | Freq: Two times a day (BID) | ORAL | Status: DC
Start: 2022-03-26 — End: 2022-03-28
  Administered 2022-03-26 – 2022-03-28 (×4): 100 mg
  Filled 2022-03-26 (×3): qty 10

## 2022-03-26 MED ORDER — ROCURONIUM BROMIDE 10 MG/ML (PF) SYRINGE
PREFILLED_SYRINGE | INTRAVENOUS | Status: DC
Start: 2022-03-26 — End: 2022-03-26
  Filled 2022-03-26: qty 10

## 2022-03-26 MED ORDER — POLYETHYLENE GLYCOL 3350 17 G PO PACK
17.0000 g | PACK | Freq: Every day | ORAL | Status: DC
Start: 2022-03-26 — End: 2022-03-28
  Administered 2022-03-26 – 2022-03-27 (×2): 17 g
  Filled 2022-03-26 (×2): qty 1

## 2022-03-26 MED ORDER — VANCOMYCIN HCL 10 G IV SOLR: 250.0000 mg | INTRAVENOUS | Status: DC

## 2022-03-26 MED ORDER — FENTANYL CITRATE PF 50 MCG/ML IJ SOSY
25.0000 ug | PREFILLED_SYRINGE | INTRAMUSCULAR | Status: DC | PRN
  Administered 2022-03-26 – 2022-03-27 (×5): 50 ug via INTRAVENOUS
  Administered 2022-03-28 (×3): 100 ug via INTRAVENOUS
  Filled 2022-03-26: qty 1
  Filled 2022-03-26: qty 2
  Filled 2022-03-26: qty 1
  Filled 2022-03-26: qty 2
  Filled 2022-03-26: qty 1
  Filled 2022-03-26: qty 2
  Filled 2022-03-26: qty 1
  Filled 2022-03-26: qty 2

## 2022-03-26 MED ORDER — FENTANYL CITRATE PF 50 MCG/ML IJ SOSY: 25.0000 ug | PREFILLED_SYRINGE | INTRAMUSCULAR | Status: DC | PRN

## 2022-03-26 MED ORDER — FENTANYL CITRATE PF 50 MCG/ML IJ SOSY
PREFILLED_SYRINGE | INTRAMUSCULAR | Status: AC
  Administered 2022-03-26: 100 ug
  Filled 2022-03-26: qty 2

## 2022-03-26 MED ORDER — SODIUM CHLORIDE 0.9% IV SOLUTION: Freq: Once | INTRAVENOUS | Status: AC

## 2022-03-26 MED ORDER — LINEZOLID 600 MG/300ML IV SOLN
600.0000 mg | Freq: Two times a day (BID) | INTRAVENOUS | Status: DC
  Administered 2022-03-26 – 2022-03-27 (×2): 600 mg via INTRAVENOUS
  Filled 2022-03-26 (×2): qty 300

## 2022-03-26 MED ORDER — CHLORHEXIDINE GLUCONATE CLOTH 2 % EX PADS
6.0000 | MEDICATED_PAD | Freq: Every day | CUTANEOUS | Status: DC
Start: 2022-03-26 — End: 2022-03-26
  Administered 2022-03-26: 6 via TOPICAL

## 2022-03-26 MED ORDER — DEXTROSE 50 % IV SOLN
12.5000 g | INTRAVENOUS | Status: AC
  Administered 2022-03-26: 12.5 g via INTRAVENOUS
  Filled 2022-03-26: qty 50

## 2022-03-26 MED ORDER — ACETAMINOPHEN 650 MG RE SUPP
650.0000 mg | Freq: Four times a day (QID) | RECTAL | Status: DC | PRN
  Filled 2022-03-26: qty 1

## 2022-03-26 MED ORDER — KETAMINE HCL 50 MG/5ML IJ SOSY
PREFILLED_SYRINGE | INTRAMUSCULAR | Status: AC
  Administered 2022-03-26: 100 mg
  Filled 2022-03-26: qty 5

## 2022-03-26 MED ORDER — SODIUM CHLORIDE 0.9 % IV SOLN
2.0000 g | Freq: Every day | INTRAVENOUS | Status: DC
  Administered 2022-03-26: 2 g via INTRAVENOUS
  Filled 2022-03-26: qty 12.5

## 2022-03-26 NOTE — Progress Notes (Signed)
I spoke to [place] with [facility] [phone]) to get more information on patient's family situation. She informed me that patient's primary point of contact has been her grandson Transport planner. Jimmie Molly does not live with the patient, so is not one of the 2 grandsons involved in the APS case.   I talked with the patient, who said if she's not able to make medical decisions for herself, she'd like her grandson Jimmie Molly to be her voice. As a backup, she also named her brother Lissa Merlin to be her voice.  Dr. Alton Revere spoke with brother Lissa Merlin today. He is elderly and was overwhelmed by how sick Ms Bielicki is. He did not want to step on the toes of the grandchildren, and deferred medical decision making to them.  So, I called Jimmie Molly this afternoon to talk more. Jimmie Molly has served as his Gaffer, helping with bills and medical needs, for the past 1.5 years. He works, and is has worked in Teacher, adult education previously, so has a clear understanding of the current situation.  I explained how Ms Swint got much sicker today - she is requiring lots of oxygen to support her breathing, and I'm concerned that she may need to go to the intensive care unit and get support from a ventilator if she continues on this trajectory. I asked Jimmie Molly if they had ever talked about the kind of care she'd like to receive if she were very ill. They had talked about funeral arrangements, but had never specifically discussed things like Code Status or advanced directives. As far as he knows, she is a Full Code. [name] from [place] also confirmed that patient has been a full code at their last discussion in 09/2021.)  Jimmie Molly is working tomorrow, but will try to take some time off to be in sometime between 11am-4pm for a meeting. He is supportive of a palliative care consult. For now, we will continue full scope of aggressive care, including antibiotics, and ICU transfer with full code if needed.

## 2022-03-26 NOTE — Consult Note (Addendum)
NAME:  [name], MRN:  [id number], DOB:  [birth year], LOS: 2 ADMISSION DATE:  03/22/2022, CONSULTATION DATE:  8/8 REFERRING MD:  Dr. Cain Sieve, CHIEF COMPLAINT:  rib fx; hypoxic resp failure   History of Present Illness:  Patient is a 76 year old female with pertinent PMH of RA, ILD (seen by Dr. Eartha Inch outpatient), DM T2, b/l AKA, HTN, HLD presents to [facility] ED on 8/4 after fall.  Patient is wheelchair-bound.  Patient lives with grandsons at home.  While she was sleeping in wheelchair she fell forward.  Denies loss of consciousness.  Having left-sided chest pain with movement and coughing.  Patient brought to [facility] ED on 8/4 for further work-up.  Upon arrival to [facility] ED patient CT chest showing multiple rib fractures on left side.  Patient admitted to IM TS on floors.  On 8/8 patient began to have acute hypoxia.  Was placed on NRB.  Patient also more altered than she was this morning.  Nurse states she is still some food in mouth when suctioned and was concerned about aspiration.  CXR with increasing right-sided consolidation.  Currently on salter HFNC with sats upper 80s. Lethargic but able to state name. Very weak congested cough. PCCM consulted.  Pertinent  Medical History   Past Medical History:  Diagnosis Date   Bilateral lower extremity edema 06/06/2014   Cataracts, bilateral 02/13/2011   Seen on eye exam 02/07/11. F/u in 12 months    Diabetes mellitus age 27   Diabetic ulcer of toe ([place]) 12/12/2015   Hyperlipidemia    Hypertension    Retinal detachment, old, partial    left   Retinopathy due to secondary diabetes mellitus ([place])    L>R, laser 3/07   Schizotypal personality disorder ([place])    Thyroid disease    Weight loss 01/22/2012     Significant Hospital Events: Including procedures, antibiotic start and stop dates in addition to other pertinent events   8/4: Admitted to [facility] with rib fractures 8/8: PCCM consulted for acute hypoxic respiratory failure  Interim History / Subjective:   See H&P  Objective   Blood pressure 120/66, pulse 98, temperature 98.4 F (36.9 C), temperature source Oral, resp. rate 18, height '5\' 2"'$  (1.575 m), weight 40 kg, SpO2 92 %.        Intake/Output Summary (Last 24 hours) at 03/26/2022 1443 Last data filed at 03/26/2022 1305 Gross per 24 hour  Intake 1255 ml  Output --  Net 1255 ml   Filed Weights   03/22/22 0256  Weight: 40 kg    Examination: General:  critically ill appearing on salter [place] HEENT: MM pink/moist; salter [place] in place; Neck stiff positioned toward left Neuro: Lethargic; able to state name; PERRL CV: s1s2, no m/r/g PULM:  dim BS bilaterally; Salter HFNC 15 L with sats mid 80s; congested nonproductive weak cough GI: soft, bsx4 active  Extremities: warm/dry, no edema  Skin: no rashes or lesions appreciated  CXR 8/8 showing increasing consolidation in Right lung compared to prior; questionable aspiration; personally reviewed  Resolved Hospital Problem list     Assessment & Plan:  Acute respiratory failure with hypoxia/hypercarbia Possible aspiration Multifocal Pneumonia Hx of ILD and RA: seen by Dr. Elsworth Soho; no outpt treatment P: -Will intubate for airway protection -LTVV strategy with tidal volumes of 6-8 cc/kg ideal body weight -check ABG and adjust settings accordingly  -cxr post intubation -Goal plateau pressures less than 30 and driving pressures less than 15 -Wean PEEP/FiO2 for SpO2 >92% -send sputum  culture and mrsa pcr; unasyn and linezolid  -VAP bundle in place -Daily SAT and SBT -PAD protocol in place -wean sedation for RASS goal 0 to -1 -pulm toiletry: CPT -viral panel pending -trend WBC/fever curve -continue low dose steroids -prn duoneb  Acute Encephalopathy P: -likely hypercarbia related -will require intubation; adjust vent settings for normocapnea -limit sedating meds  Hx of b/l AKA: wheelchair dependent P: -supportive care  AKI  P: -Trend BMP / urinary output -Replace  electrolytes as indicated -Avoid nephrotoxic agents, ensure adequate renal perfusion  T2DM P: -ssi and cbg monitoring  HTN/HLD P: -cont amlodipine, asa, rosuvastatin -hold home lasix and losartan in setting of aki  Hypothyroidism P: -synthroid    Best Practice (right click and "Reselect all SmartList Selections" daily)   Diet/type: NPO w/ meds via tube DVT prophylaxis: LMWH GI prophylaxis: PPI Lines: N/A Foley:  N/A Code Status:  full code Last date of multidisciplinary goals of care discussion [8/8 Dr. Elsworth Soho spoke with grandson over phone; family wants everything done. Okay with intubation if needed.]  Labs   CBC: Recent Labs  Lab 03/22/22 0315 03/23/22 0444 03/25/22 0730 03/26/22 0752 03/26/22 1441  WBC 8.8 10.9* 17.6* 25.3*  --   HGB 10.7* 7.7* 7.2* 6.9* 9.2*  HCT 34.8* 25.1* 24.9* 24.1* 27.0*  MCV 89.9 92.6 96.1 98.0  --   PLT 297 271 258 242  --     Basic Metabolic Panel: Recent Labs  Lab 03/22/22 0315 03/23/22 0124 03/24/22 0148 03/25/22 0730 03/26/22 0752 03/26/22 1441  NA 136 136 137 139 139 140  K 4.3 4.4 4.7 5.0 5.3* 5.8*  CL 106 105 108 111 112*  --   CO2 '22 23 24 24 '$ 21*  --   GLUCOSE 197* 203* 99 86 128*  --   BUN 41* 39* 40* 53* 61*  --   CREATININE 1.34* 1.34* 1.36* 1.72* 1.99*  --   CALCIUM 9.2 8.7* 8.6* 8.6* 8.5*  --    GFR: Estimated Creatinine Clearance: 15.2 mL/min (A) (by C-G formula based on SCr of 1.99 mg/dL (H)). Recent Labs  Lab 03/22/22 0315 03/23/22 0444 03/25/22 0730 03/26/22 0752  PROCALCITON  --   --  0.16  --   WBC 8.8 10.9* 17.6* 25.3*    Liver Function Tests: No results for input(s): "AST", "ALT", "ALKPHOS", "BILITOT", "PROT", "ALBUMIN" in the last 168 hours. No results for input(s): "LIPASE", "AMYLASE" in the last 168 hours. No results for input(s): "AMMONIA" in the last 168 hours.  ABG    Component Value Date/Time   PHART 7.222 (L) 03/26/2022 1441   PCO2ART 58.1 (H) 03/26/2022 1441   PO2ART 83  03/26/2022 1441   HCO3 24.0 03/26/2022 1441   TCO2 26 03/26/2022 1441   ACIDBASEDEF 4.0 (H) 03/26/2022 1441   O2SAT 93 03/26/2022 1441     Coagulation Profile: No results for input(s): "INR", "PROTIME" in the last 168 hours.  Cardiac Enzymes: No results for input(s): "CKTOTAL", "CKMB", "CKMBINDEX", "TROPONINI" in the last 168 hours.  HbA1C: HbA1c, POC (prediabetic range)  Date/Time Value Ref Range Status  03/23/2020 09:01 AM 5.8 5.7 - 6.4 % Final   HbA1c, POC (controlled diabetic range)  Date/Time Value Ref Range Status  04/19/2021 12:20 PM 6.2 0.0 - 7.0 % Final  11/28/2020 08:29 AM 6.0 0.0 - 7.0 % Final   Hgb A1c MFr Bld  Date/Time Value Ref Range Status  03/23/2022 04:44 AM 6.2 (H) 4.8 - 5.6 % Final    Comment:    (  NOTE) Pre diabetes:          5.7%-6.4%  Diabetes:              >6.4%  Glycemic control for   <7.0% adults with diabetes     CBG: Recent Labs  Lab 03/25/22 1156 03/25/22 1626 03/25/22 2210 03/26/22 0750 03/26/22 1157  GLUCAP 151* 178* 96 122* 165*    Review of Systems:   Patient is encephalopathic and/or intubated. Therefore history has been obtained from chart review.    Past Medical History:  She,  has a past medical history of Bilateral lower extremity edema (06/06/2014), Cataracts, bilateral (02/13/2011), Diabetes mellitus (age 37), Diabetic ulcer of toe ([place]) (12/12/2015), Hyperlipidemia, Hypertension, Retinal detachment, old, partial, Retinopathy due to secondary diabetes mellitus ([place]), Schizotypal personality disorder ([place]), Thyroid disease, and Weight loss (01/22/2012).   Surgical History:   Past Surgical History:  Procedure Laterality Date   ABDOMINAL AORTOGRAM W/LOWER EXTREMITY N/A 07/23/2018   Procedure: ABDOMINAL AORTOGRAM W/LOWER EXTREMITY;  Surgeon: Marty Heck, MD;  Location: [place] CV LAB;  Service: Cardiovascular;  Laterality: N/A;   ABOVE KNEE LEG AMPUTATION Bilateral 09/27/2019   AMPUTATION Bilateral 09/27/2019    Procedure: AMPUTATION ABOVE KNEE;  Surgeon: Angelia Mould, MD;  Location: [facility] OR;  Service: Vascular;  Laterality: Bilateral;   BREAST EXCISIONAL BIOPSY Bilateral    BREAST SURGERY     CATARACT EXTRACTION     left    EYE SURGERY     ROTATOR CUFF REPAIR  1990's   left   Thyroid radiation ablation     for Graves Disease   TRANSTHORACIC ECHOCARDIOGRAM      EF55-65%, nml - 12/14/2004     Social History:   reports that she has never smoked. She has never been exposed to tobacco smoke. She has never used smokeless tobacco. She reports that she does not drink alcohol and does not use drugs.   Family History:  Her family history includes Breast cancer in her niece; Colon cancer in her brother and maternal grandmother; Diabetes in her daughter; Heart attack in her mother; Heart disease in her mother; Hypertension in her mother; Liver cancer in her brother; Liver disease in her sister; Pulmonary fibrosis in her father.   Allergies Allergies  Allergen Reactions   Feraheme [Ferumoxytol] Itching   Rosiglitazone Maleate Other (See Comments)    REACTION: Difficulty walking, Fatigue, shortness of breath     Home Medications  Prior to Admission medications   Medication Sig Start Date End Date Taking? Authorizing Provider  amLODipine (NORVASC) 10 MG tablet Take 1 tablet (10 mg total) by mouth at bedtime. Patient taking differently: Take 10 mg by mouth every morning. 04/19/21  Yes [name], MD  Cholecalciferol (VITAMIN D3) 50 MCG (2000 UT) TABS Take 2,000 Units by mouth every morning.   Yes [provider]  furosemide (LASIX) 40 MG tablet TAKE 1 TABLET BY MOUTH EVERY DAY Patient taking differently: Take 40 mg by mouth every morning. 05/08/21  Yes [name], MD  levothyroxine (SYNTHROID) 175 MCG tablet Take 175 mcg by mouth every morning.   Yes [provider]  lidocaine (LIDODERM) 5 % Apply one patch to each lower limbs for 12 hours a day as needed for  phantom pain. Remove after 12 hours or as directed by MD Patient taking differently: Place 1 patch onto the skin See admin instructions. Apply one patch to each lower limbs for 12 hours a day as needed for phantom pain.  Remove after 12 hours or as directed by MD 01/25/20  Yes Kinnie Feil, MD  losartan (COZAAR) 50 MG tablet Take 50 mg by mouth every morning.   Yes [provider]  nystatin (MYCOSTATIN/NYSTOP) powder Apply 1 Application topically 2 (two) times daily as needed (yeast under breasts (intertrigo)).   Yes [provider]  predniSONE (DELTASONE) 5 MG tablet Take 5 mg by mouth every morning.   Yes [provider]  rosuvastatin (CRESTOR) 20 MG tablet Take 1 tablet (20 mg total) by mouth daily. Patient taking differently: Take 20 mg by mouth every morning. 12/27/20  Yes [name], MD  acetaminophen (TYLENOL) 325 MG tablet Take 2 tablets (650 mg total) by mouth every 6 (six) hours. Patient not taking: Reported on 03/22/2022 10/02/19   Nuala Alpha, MD  aspirin EC 81 MG tablet Take 1 tablet (81 mg total) by mouth daily. Swallow whole. Patient not taking: Reported on 03/22/2022 04/19/21   [name], MD  doxycycline (VIBRA-TABS) 100 MG tablet Take 1 tablet (100 mg total) by mouth 2 (two) times daily. Patient not taking: Reported on 01/01/2022 12/11/21   Martyn Ehrich, NP  fluticasone [facility]) 50 MCG/ACT nasal spray Place 1 spray into both nostrils daily. Patient not taking: Reported on 03/22/2022 12/11/21   Martyn Ehrich, NP  gabapentin (NEURONTIN) 100 MG capsule TAKE 1 CAPSULE (100 MG TOTAL) BY MOUTH EVERY 8 (EIGHT) HOURS Patient not taking: Reported on 03/22/2022 05/08/21   [name], MD  guaiFENesin-dextromethorphan [facility] DM) 100-10 MG/5ML syrup Take 5 mLs by mouth every 4 (four) hours as needed for cough. Patient not taking: Reported on 03/06/2022 12/11/21   Martyn Ehrich, NP  Incontinence Supply Disposable (INCONTINENCE  BRIEF MEDIUM) MISC Wear as needed for incontinence 03/23/20   [name], MD  levothyroxine (SYNTHROID) 150 MCG tablet TAKE 1 TABLET BY MOUTH EVERY DAY Patient not taking: Reported on 03/22/2022 04/04/21   [name], MD  Misc. Devices MISC Mepilex silver sulfate-foam bandage 6x6" change dressing weekly 12/15/20   [name], MD  PROAIR HFA 108 [phone] Base) MCG/ACT inhaler INHALE 2 PUFFS INTO THE LUNGS EVERY 6 HOURS AS NEEDED FOR WHEEZING OR SHORTNESS OF BREATH Patient not taking: Reported on 03/22/2022 08/31/18   [name], MD     Critical care time: 75 minutes    JD Geryl Rankins Pulmonary [facility] Critical Care 03/26/2022, 2:43 PM  Please see [email or web address] for pager details.  From 7A-7P if no response, please call [phone]. After hours, please call ELink [phone].

## 2022-03-26 NOTE — Progress Notes (Signed)
RT called to bedside by Rapid response RN due to pt desat. When RT arrived at beside pt was on 9L NRB. ABG collected by RT. RT placed pt on 8L HFNC, pt tolerating well at this time, no increased WOB noted, Rapid Response RN notified of ABG results, MD notified, no new orders given at this time, RT will monitor.      03/26/22 14:41  Sample type  ARTERIAL  pH, Arterial  7.222 (L)  pCO2 arterial  58.1 (H)  pO2, Arterial  83  TCO2  26  Acid-base deficit  4.0 (H)  Bicarbonate  24.0  O2 Saturation  93  Patient temperature  98.4 F  Collection site  RADIAL, ALLEN'S TEST ACCEPTABLE

## 2022-03-26 NOTE — Progress Notes (Signed)
Pharmacy Antibiotic Note  [name] is a 76 y.o. female admitted on 03/22/2022 with pneumonia.  Pharmacy has been consulted for vancomycin and cefepime dosing.  Plan: Vancomycin 500 mg IV x1, followed by vancomycin 250 mg IV Q24h (eAUC 510, goal AUC 400-550, Scr 1.99, Vd 0.72) Cefepime IV Q8h  Trend WBC, fever, renal function F/u cultures, clinical progress, levels as indicated De-escalate when able   Height: '5\' 2"'$  (157.5 cm) Weight: 40 kg (88 lb 2.9 oz) IBW/kg (Calculated) : 50.1  Temp (24hrs), Avg:99.4 F (37.4 C), Min:98.2 F (36.8 C), Max:100.1 F (37.8 C)  Recent Labs  Lab 03/22/22 0315 03/23/22 0124 03/23/22 0444 03/24/22 0148 03/25/22 0730 03/26/22 0752  WBC 8.8  --  10.9*  --  17.6* 25.3*  CREATININE 1.34* 1.34*  --  1.36* 1.72* 1.99*    Estimated Creatinine Clearance: 15.2 mL/min (A) (by C-G formula based on SCr of 1.99 mg/dL (H)).    Allergies  Allergen Reactions   Feraheme [Ferumoxytol] Itching   Rosiglitazone Maleate Other (See Comments)    REACTION: Difficulty walking, Fatigue, shortness of breath    Thank you for allowing pharmacy to be a part of this patient's care.  Ardyth Harps, PharmD Clinical Pharmacist

## 2022-03-26 NOTE — Progress Notes (Signed)
[place] Progress Note Patient Name: [name] DOB: Mar 14, 1946 MRN: [id number]   Date of Service  03/26/2022  HPI/Events of Note  K+ 5.8, GFR 27  eICU Interventions  Lokelma 10 mg via OG tube ordered, K+ re-check ordered for 1 am.        Frederik Pear 03/26/2022, 9:23 PM

## 2022-03-26 NOTE — Progress Notes (Signed)
Critcal Lab call to Charge Nurse Hemo 6.9. Paged Laureles, Md.

## 2022-03-26 NOTE — Progress Notes (Signed)
Subjective:  The patient had no overnight events.  Patient appears more somnolent this morning and is having increased oxygen requirements.  The patient's responses are for the most part comprehensible but occasionally difficult to discern.  Discussed that a spiritual consult was placed in order to have the patient discuss the situation regarding her power of attorney should her condition worsen.  Patient states that she would like [place] to contact her grandson [name] or her brother Lissa Merlin regarding this matter.    Objective:  Vital signs in last 24 hours: Vitals:   03/26/22 1338 03/26/22 1357 03/26/22 1446 03/26/22 1541  BP: 120/66   (!) 119/57  Pulse: 98  83 88  Resp: '18  18 20  '$ Temp:      TempSrc:    Oral  SpO2: 95% 92% 98% 100%  Weight:      Height:        Physical Exam: Constitutional:      General: Not in acute distress.     Comments: appears chronically-ill, appears somnolent with most responses comprehensible.  Cardiovascular:     Rate and Rhythm: Normal rate and regular rhythm. No murmurs, rubs, or gallops.  Pulmonary:     Effort: Pulmonary effort is normal.     Comments: Shallow breathing due to pain with inspiration/expiration. Difficult to discern between transmitted lung sounds vs crackles bilaterally. Abdominal:     General: Bowel sounds are normal. No distension or tenderness.  Musculoskeletal:     Comments: Moderate tenderness to palpation of the left chest.  Bilateral AKA.   Assessment/Plan:  Principal Problem:   Multiple rib fractures Active Problems:   Hypothyroidism   HYPERTENSION, BENIGN SYSTEMIC   Rheumatoid arthritis (HCC)   S/P AKA (above knee amputation) bilateral (HCC)   Controlled type 2 diabetes mellitus with stable proliferative retinopathy of both eyes, with long-term current use of insulin (HCC)   ILD (interstitial lung disease) ([place])   AKI (acute kidney injury) ([place])   Acute respiratory failure with hypoxia (HCC)  [name] is a 76  y/o female with past medical history of RA (not on immunosuppressive therapy or steroids), ILD (not currently on treatment), bilateral AKA secondary to diabetic ulcer (2021), T2DM, HTN, and wheelchair dependence that was admitted for pain control of multiple left-sided rib fractures after a fall from her wheelchair.    # Multiple rib fractures # S/p bilateral AKA # Wheelchair dependent # Pneumonia CT chest on admission demonstrated nondisplaced fracture of the left lateral 5th-7th ribs as well as displaced fracture of the left posterior 7th-8th ribs.   Chest x-ray on 8/6 demonstrated interstitial opacities in the lower lungs left > right spacious of chronic fibrosis as well as small area of hazy airspace opacity in the right upper lobe suspected to be infectious/inflammatory in origin. Chest x-ray today on 8/8 demonstrates moderate-to-severe bilateral airspace disease, concerning for multifocal pneumonia and probable small loculated pleural fluid along the lateral aspect of the left hemithorax. Patient's WBC count is increased today at 25.8 up from 17.6.  Procalcitonin ordered yesterday is WNL.   The patient's condition appears to have deteriorated. Patient has likely developed pneumonia due to inability to clear secretions given rib fractures. She is having increased oxygen requirements today near 10 L via [place].  Patient began desaturating into the 70s-80s this afternoon and was reassessed.  Patient was requiring 10 L on nonrebreather to maintain oxygen saturation > 95%.  Chest x-ray today is worse than previous.  ABG demonstrates pH 7.2, PCO2  58.1, bicarb 24.  PCCM was consulted and agreed to see the patient. Spiritual care was consulted again to have discussion of power of attorney with the patient.  I was able to get in contact with her brother Lissa Merlin who initially stated that he would be comfortable coming in tomorrow to have the discussion over the patient's power of attorney.  I was called back by Lissa Merlin  who stated that he felt it was better if [name] was in charge of these decisions.  Dr. Cain Sieve was able to call [name] and update him on the patient's condition. - Pending RVP  - Pending Covid swab  - Repeat ABG ordered - Initiated treatment for suspected hospital-acquired pneumonia with vancomycin, cefepime, Flagyl - Pain control: lidocaine patch q 12, tylenol suppository q6 PRN - Will hold PO oxycodone 2.5 mg q4 and oxycodone 2.5 mg q4 PRN for breakthrough pain given difficulty tolerating PO - Continue encouraging use of spirometry and flutter valve  -Patient was intubated by critical care who will also assume care of this patient    2. # AKI vs CKD # Hyperkalemia Baseline creatinine uncertain. Creatinine 1.55 in 12/2021.  Increased to 1.99 today. I suspect that this rise in creatinine is secondary to the patient not eating well while hospitalized.  Potassium of 5.3 today.  Hyperkalemia likely secondary to decreased renal function.  Patient is not tolerating PO well and therefore oral Lokelma is not a good option at this time. - Repeat BMP ordered -Could potentially start calcium gluconate if the patient's potassium continues to rise  3. # Anemia Hemoglobin 6.9 today down from 7.2 on 8/7.  -Ordered 1 unit of pRBC to transfuse -CBC reordered   4. # Nutrition Patient continues to have difficulty tolerating PO. Speech pathology completed the swallow eval and recommends dysphagia 3 diet with thin liquids. - Continue assisted feeding  - Continue aspiration precautions   5. # RA Not on immunosuppressive therapy or steroids at home.  Patient follows up with rheumatology outpatient.  No active RA flare currently.   6. # ILD Not on treatment at home.  Patient  follows up with pulmonology outpatient.    7. #Type 2 diabetes Hemoglobin A1c 6.2 on admission.  Glucose continues to be < 180.  - Will continue sliding scale - Will continue CBG monitoring   8. # Hypertension - Will hold home  amlodipine 10 mg given borderline hypotension   9. # Hyperlipidemia - Will continue home aspirin and rosuvastatin 20 mg   10. # Hypothyroidism TSH was < 0.01 on 8/4.  - Will continue home levothyroxine 150 mcg   Diet: Dysphagia 3 IVF: None DVT: Lovenox Code: Full Code   Prior to Admission Living Arrangement: home with grandsons Anticipated Discharge Location: SNF, potentially brother or sister afterwards Barriers to Discharge: continued management Dispo: Anticipated discharge in approximately in more than 2 day(s).     Starlyn Skeans, MD 03/26/2022, 4:00 PM Pager: [phone] After 5pm on weekdays and 1pm on weekends: On Call pager [phone]

## 2022-03-26 NOTE — Progress Notes (Signed)
Pharmacy Antibiotic Note  [name] is a 76 y.o. female admitted on 03/22/2022 with pneumonia. Pulm has change cefepime to unasyn. We will also change vanc to linezolid due to her hx of CKD and current AKI.   Scr 1.99  Plan: Dc vanc/cefepime Linezolid '600mg'$  IV q12 Unasyn 1.5g IV q12 MRSA PCR   Height: '5\' 2"'$  (157.5 cm) Weight: 40 kg (88 lb 2.9 oz) IBW/kg (Calculated) : 50.1  Temp (24hrs), Avg:99 F (37.2 C), Min:98.2 F (36.8 C), Max:100.1 F (37.8 C)  Recent Labs  Lab 03/22/22 0315 03/23/22 0124 03/23/22 0444 03/24/22 0148 03/25/22 0730 03/26/22 0752  WBC 8.8  --  10.9*  --  17.6* 25.3*  CREATININE 1.34* 1.34*  --  1.36* 1.72* 1.99*     Estimated Creatinine Clearance: 15.2 mL/min (A) (by C-G formula based on SCr of 1.99 mg/dL (H)).    Allergies  Allergen Reactions   Feraheme [Ferumoxytol] Itching   Rosiglitazone Maleate Other (See Comments)    REACTION: Difficulty walking, Fatigue, shortness of breath     Onnie Boer, PharmD, [place], AAHIVP, CPP Infectious Disease Pharmacist 03/26/2022 4:02 PM

## 2022-03-26 NOTE — Progress Notes (Signed)
SLP Cancellation Note  Patient Details Name: [name] MRN: [id number] DOB: 1945-09-03   Cancelled treatment:       Reason Eval/Treat Not Completed: Medical issues which prohibited therapy. Pt being transferred to ICU due to respiratory decline. Will f/u as able.    Osie Bond., M.A. [place] Office [phone]  Secure chat preferred  03/26/2022, 4:05 PM

## 2022-03-26 NOTE — Progress Notes (Signed)
PT Cancellation Note  Patient Details Name: [name] MRN: [id number] DOB: [birth year]   Cancelled Treatment:    Reason Eval/Treat Not Completed: Other (comment).  Pt is having very low BP and is anemic at 6/9 hgb.  Recheck as time and pt allow.   Ramond Dial 03/26/2022, 9:32 AM  Mee Hives, PT PhD Acute Rehab Dept. Number: [place]

## 2022-03-26 NOTE — Procedures (Signed)
Intubation Procedure Note  [name]  [id number]  04-Apr-1946  Date:03/26/22  Time:4:15 PM   Provider Performing:Ankush Gintz V. Calbert Hulsebus    Procedure: Intubation (31500)  Indication(s) Respiratory Failure  Consent Risks of the procedure as well as the alternatives and risks of each were explained to the patient and/or caregiver.  Consent for the procedure was obtained and is signed in the bedside chart   Anesthesia Ketamine 100    Time Out Verified patient identification, verified procedure, site/side was marked, verified correct patient position, special equipment/implants available, medications/allergies/relevant history reviewed, required imaging and test results available.   Sterile Technique Usual hand hygeine, masks, and gloves were used   Procedure Description Patient positioned in bed supine.  Sedation given as noted above.  Patient was intubated with endotracheal tube using Glidescope.  View was Grade 1 full glottis .  Number of attempts was 1.  Colorimetric CO2 detector was consistent with tracheal placement.   Complications/Tolerance None; patient tolerated the procedure well. Chest X-ray is ordered to verify placement.   EBL Minimal   Specimen(s) None  Appreciate difficult airway back up for procedure   Jamason Peckham V. AlvaM D

## 2022-03-26 NOTE — Progress Notes (Signed)
Pt desat in the Low 70's notified by pager [phone] Starlyn Skeans, MD, Repaid was contact and Respirator. MD Mapp came to bedside new orders was put in. MD Mapp wanted pt on HF 10L stat 98%.

## 2022-03-27 ENCOUNTER — Inpatient Hospital Stay (HOSPITAL_COMMUNITY): Payer: Medicare (Managed Care)

## 2022-03-27 DIAGNOSIS — Z794 Long term (current) use of insulin: Secondary | ICD-10-CM | POA: Diagnosis not present

## 2022-03-27 DIAGNOSIS — E113553 Type 2 diabetes mellitus with stable proliferative diabetic retinopathy, bilateral: Secondary | ICD-10-CM | POA: Diagnosis not present

## 2022-03-27 DIAGNOSIS — N179 Acute kidney failure, unspecified: Secondary | ICD-10-CM | POA: Diagnosis not present

## 2022-03-27 DIAGNOSIS — S2242XA Multiple fractures of ribs, left side, initial encounter for closed fracture: Secondary | ICD-10-CM | POA: Diagnosis not present

## 2022-03-27 LAB — BPAM RBC
Blood Product Expiration Date: 202309012359
ISSUE DATE / TIME: 202308081234
Unit Type and Rh: 6200

## 2022-03-27 LAB — CBC
HCT: 29.3 % — ABNORMAL LOW (ref 36.0–46.0)
Hemoglobin: 9.1 g/dL — ABNORMAL LOW (ref 12.0–15.0)
MCH: 28.5 pg (ref 26.0–34.0)
MCHC: 31.1 g/dL (ref 30.0–36.0)
MCV: 91.8 fL (ref 80.0–100.0)
Platelets: 220 10*3/uL (ref 150–400)
RBC: 3.19 MIL/uL — ABNORMAL LOW (ref 3.87–5.11)
RDW: 14.5 % (ref 11.5–15.5)
WBC: 19.2 10*3/uL — ABNORMAL HIGH (ref 4.0–10.5)
nRBC: 0 % (ref 0.0–0.2)

## 2022-03-27 LAB — BASIC METABOLIC PANEL
Anion gap: 6 (ref 5–15)
Anion gap: 10 (ref 5–15)
BUN: 65 mg/dL — ABNORMAL HIGH (ref 8–23)
BUN: 61 mg/dL — ABNORMAL HIGH (ref 8–23)
CO2: 19 mmol/L — ABNORMAL LOW (ref 22–32)
CO2: 19 mmol/L — ABNORMAL LOW (ref 22–32)
Calcium: 8.6 mg/dL — ABNORMAL LOW (ref 8.9–10.3)
Calcium: 8.6 mg/dL — ABNORMAL LOW (ref 8.9–10.3)
Chloride: 114 mmol/L — ABNORMAL HIGH (ref 98–111)
Chloride: 112 mmol/L — ABNORMAL HIGH (ref 98–111)
Creatinine, Ser: 1.99 mg/dL — ABNORMAL HIGH (ref 0.44–1.00)
Creatinine, Ser: 1.86 mg/dL — ABNORMAL HIGH (ref 0.44–1.00)
GFR, Estimated: 26 mL/min — ABNORMAL LOW (ref >60)
GFR, Estimated: 28 mL/min — ABNORMAL LOW (ref >60)
Glucose, Bld: 101 mg/dL — ABNORMAL HIGH (ref 70–99)
Glucose, Bld: 138 mg/dL — ABNORMAL HIGH (ref 70–99)
Potassium: 5.5 mmol/L — ABNORMAL HIGH (ref 3.5–5.1)
Potassium: 5.3 mmol/L — ABNORMAL HIGH (ref 3.5–5.1)
Sodium: 139 mmol/L (ref 135–145)
Sodium: 141 mmol/L (ref 135–145)

## 2022-03-27 LAB — GLUCOSE, CAPILLARY
Glucose-Capillary: 115 mg/dL — ABNORMAL HIGH (ref 70–99)
Glucose-Capillary: 141 mg/dL — ABNORMAL HIGH (ref 70–99)
Glucose-Capillary: 143 mg/dL — ABNORMAL HIGH (ref 70–99)
Glucose-Capillary: 159 mg/dL — ABNORMAL HIGH (ref 70–99)
Glucose-Capillary: 250 mg/dL — ABNORMAL HIGH (ref 70–99)
Glucose-Capillary: 85 mg/dL (ref 70–99)

## 2022-03-27 LAB — TYPE AND SCREEN
ABO/RH(D): A POS
Antibody Screen: NEGATIVE
Unit division: 0

## 2022-03-27 LAB — SARS CORONAVIRUS 2 (TAT 6-24 HRS): SARS Coronavirus 2: NEGATIVE

## 2022-03-27 LAB — MAGNESIUM: Magnesium: 2.4 mg/dL (ref 1.7–2.4)

## 2022-03-27 LAB — PHOSPHORUS: Phosphorus: 4 mg/dL (ref 2.5–4.6)

## 2022-03-27 MED ORDER — PREDNISONE 5 MG PO TABS
5.0000 mg | ORAL_TABLET | Freq: Every morning | ORAL | Status: DC
  Administered 2022-03-27: 5 mg
  Filled 2022-03-27: qty 1

## 2022-03-27 MED ORDER — OSMOLITE 1.2 CAL PO LIQD
1000.0000 mL | ORAL | Status: DC
  Administered 2022-03-27 – 2022-03-30 (×4): 1000 mL
  Filled 2022-03-27 (×4): qty 1000

## 2022-03-27 MED ORDER — ROSUVASTATIN CALCIUM 20 MG PO TABS
20.0000 mg | ORAL_TABLET | Freq: Every day | ORAL | Status: DC
  Administered 2022-03-28 – 2022-04-01 (×5): 20 mg
  Filled 2022-03-27 (×5): qty 1

## 2022-03-27 MED ORDER — ORAL CARE MOUTH RINSE: 15.0000 mL | OROMUCOSAL | Status: DC | PRN

## 2022-03-27 MED ORDER — ORAL CARE MOUTH RINSE
15.0000 mL | OROMUCOSAL | Status: DC
Start: 2022-03-27 — End: 2022-03-31
  Administered 2022-03-27 – 2022-03-31 (×39): 15 mL via OROMUCOSAL

## 2022-03-27 MED ORDER — PROSOURCE TF20 ENFIT COMPATIBL EN LIQD
60.0000 mL | Freq: Every day | ENTERAL | Status: DC
Start: 2022-03-27 — End: 2022-04-01
  Administered 2022-03-27 – 2022-03-31 (×5): 60 mL
  Filled 2022-03-27 (×6): qty 60

## 2022-03-27 MED ORDER — DEXAMETHASONE SODIUM PHOSPHATE 4 MG/ML IJ SOLN
4.0000 mg | Freq: Four times a day (QID) | INTRAMUSCULAR | Status: DC
  Administered 2022-03-27 – 2022-03-28 (×4): 4 mg via INTRAVENOUS
  Filled 2022-03-27 (×4): qty 1

## 2022-03-27 MED ORDER — ASPIRIN 81 MG PO CHEW
81.0000 mg | CHEWABLE_TABLET | Freq: Every day | ORAL | Status: DC
  Administered 2022-03-27 – 2022-04-01 (×6): 81 mg
  Filled 2022-03-27 (×6): qty 1

## 2022-03-27 NOTE — Progress Notes (Signed)
Physical Therapy Treatment Patient Details Name: [name] MRN: [id number] DOB: 1946-07-16 Today's Date: 03/27/2022   History of Present Illness Patient is a 76 y/o female who presents 03/22/22 with left rib fxs 5-8 s/p fall from her w/c. Intubated 8/8. PMH includes bil AKAs, schizotypal personality, HTN, DM, retinal detachment.    PT Comments    Pt was able to progress to sitting EOB briefly, but required TA to do so and maintain sitting balance due to gross muscular weakness. Pt has poor control over her neck and trunk, needing assistance and cues to extend and not fall into a flexed position. Pt performed bed-level exercises to address these deficits in strength and cervical ROM. Will continue to follow acutely. Current recommendations remain appropriate.      Recommendations for follow up therapy are one component of a multi-disciplinary discharge planning process, led by the attending physician.  Recommendations may be updated based on patient status, additional functional criteria and insurance authorization.  Follow Up Recommendations  Skilled nursing-short term rehab (<3 hours/day) Can patient physically be transported by private vehicle: No   Assistance Recommended at Discharge Frequent or constant Supervision/Assistance  Patient can return home with the following Two people to help with walking and/or transfers;Direct supervision/assist for financial management;A lot of help with bathing/dressing/bathroom;Assistance with cooking/housework;Direct supervision/assist for medications management;Assist for transportation   Equipment Recommendations  None recommended by PT    Recommendations for Other Services       Precautions / Restrictions Precautions Precautions: Fall;Other (comment) Precaution Comments: bil AKAs; ETT; bil mittens Restrictions Weight Bearing Restrictions: No     Mobility  Bed Mobility Overal bed mobility: Needs Assistance Bed Mobility: Supine to Sit,  Sit to Supine     Supine to sit: Total assist, HOB elevated Sit to supine: Total assist, HOB elevated   General bed mobility comments: TA to support trunk and head while pivoting hips with bed pad to sit up R EOB and return to supine after pt did not tolerate well with noted gagging and pt trying to reach for ETT with R UE (blocked by PT).    Transfers                   General transfer comment: deferred    Ambulation/Gait               General Gait Details: N/A   Stairs             Wheelchair Mobility    Modified Rankin (Stroke Patients Only)       Balance Overall balance assessment: Needs assistance Sitting-balance support: No upper extremity supported, Feet unsupported Sitting balance-Leahy Scale: Zero Sitting balance - Comments: TA to sit EOB for ~15 sec, cuing pt to extend trunk and neck with min success Postural control: Other (comment) (anterior lean/trunk flexion)     Standing balance comment: N/A                            Cognition Arousal/Alertness: Lethargic, Awake/alert Behavior During Therapy: Flat affect Overall Cognitive Status: Difficult to assess                                 General Comments: Pt able to follow simple commands with increased time. Needs cues for sequencing tasks and to remind her to position her neck in more midline. Able to wake up with  stimuli        Exercises General Exercises - Upper Extremity Shoulder Flexion: AROM, Right, 10 reps, Supine Elbow Flexion: AROM, Both, 5 reps, 10 reps, Supine (10 reps on R, 5 on L) Elbow Extension: AROM, Both, 5 reps, 10 reps, Supine (10 reps on R, 5 on L) Wrist Flexion: AROM, Both, 10 reps, Supine Wrist Extension: AROM, Both, 10 reps, Supine General Exercises - Lower Extremity Hip ABduction/ADduction: AROM, Both, 10 reps, Supine Straight Leg Raises: AROM, Both, 10 reps, Supine Other Exercises Other Exercises: Gentle PROM into R lateral  cervical flexion 3 x ~20sec    General Comments General comments (skin integrity, edema, etc.): VSS intubated on vent 40% FiO2 PEEP 5      Pertinent Vitals/Pain Pain Assessment Pain Assessment: Faces Faces Pain Scale: Hurts little more Pain Location: neck, L UE Pain Descriptors / Indicators: Grimacing, Guarding, Discomfort Pain Intervention(s): Limited activity within patient's tolerance, Monitored during session, Repositioned, Other (comment) (gentle stretching)    Home Living                          Prior Function            PT Goals (current goals can now be found in the care plan section) Acute Rehab PT Goals Patient Stated Goal: did not state, intubated, but gestured to sit up PT Goal Formulation: With patient Time For Goal Achievement: 04/05/22 Potential to Achieve Goals: Fair Progress towards PT goals: Progressing toward goals    Frequency    Min 2X/week      PT Plan Current plan remains appropriate    Co-evaluation              AM-PAC PT "6 Clicks" Mobility   Outcome Measure  Help needed turning from your back to your side while in a flat bed without using bedrails?: Total Help needed moving from lying on your back to sitting on the side of a flat bed without using bedrails?: Total Help needed moving to and from a bed to a chair (including a wheelchair)?: Total Help needed standing up from a chair using your arms (e.g., wheelchair or bedside chair)?: Total Help needed to walk in hospital room?: Total Help needed climbing 3-5 steps with a railing? : Total 6 Click Score: 6    End of Session Equipment Utilized During Treatment: Oxygen Activity Tolerance: Patient tolerated treatment well Patient left: in bed;with call bell/phone within reach;with bed alarm set;with restraints reapplied Nurse Communication: Mobility status PT Visit Diagnosis: Pain;Muscle weakness (generalized) (M62.81) Pain - Right/Left: Left Pain - part of body: Leg;Arm  (neck)     Time: 9242-6834 PT Time Calculation (min) (ACUTE ONLY): 20 min  Charges:  $Therapeutic Activity: 8-22 mins                     Moishe Spice, PT, DPT Acute Rehabilitation Services  Office: [phone]    [name] 03/27/2022, 2:48 PM

## 2022-03-27 NOTE — Inpatient Diabetes Management (Signed)
Inpatient Diabetes Program Recommendations  AACE/ADA: New Consensus Statement on Inpatient Glycemic Control (2015)  Target Ranges:  Prepandial:   less than 140 mg/dL      Peak postprandial:   less than 180 mg/dL (1-2 hours)      Critically ill patients:  140 - 180 mg/dL   Lab Results  Component Value Date   GLUCAP 141 (H) 03/27/2022   HGBA1C 6.2 (H) 03/23/2022    Review of Glycemic Control  Latest Reference Range & Units 03/27/22 01:16  GFR, Estimated >60 mL/min 26 (L)  (L): Data is abnormally low   Latest Reference Range & Units 03/26/22 20:39 03/26/22 23:10 03/27/22 03:23 03/27/22 08:26  Glucose-Capillary 70 - 99 mg/dL 147 (H) Novolog 1 unit  68 (L) 85 141 (H)  (H): Data is abnormally high (L): Data is abnormally low  Diabetes history: DM Outpatient Diabetes medications: None Current orders for Inpatient glycemic control: Novolog 0-9 units Q4H; NPO  Inpatient Diabetes Program Recommendations:    Novolog 0-6 units Q4H (very sensitive scale)  Will continue to follow while inpatient.  Thank you, Reche Dixon, MSN, [place] Diabetes Coordinator Inpatient Diabetes Program [phone] (team pager from 8a-5p)

## 2022-03-27 NOTE — Procedures (Signed)
Cortrak  Person Inserting Tube:  [name] D, RD Tube Type:  Cortrak - 43 inches Tube Size:  10 Tube Location:  Left nare Secured by: Bridle Technique Used to Measure Tube Placement:  Marking at nare/corner of mouth Cortrak Secured At:  64 cm  Cortrak Tube Team Note:  Consult received to place a Cortrak feeding tube.   X-ray is required, abdominal x-ray has been ordered by the Cortrak team. Please confirm tube placement before using the Cortrak tube.   If the tube becomes dislodged please keep the tube and contact the Cortrak team at [email or web address] (password [email or web address]) for replacement.  If after hours and replacement cannot be delayed, place a NG tube and confirm placement with an abdominal x-ray.    [name], RD, LDN Clinical Dietitian RD pager # available in [place]  After hours/weekend pager # available in [facility]

## 2022-03-27 NOTE — Progress Notes (Signed)
Initial Nutrition Assessment  DOCUMENTATION CODES:   Severe malnutrition in context of chronic illness  INTERVENTION:   Initiate tube feeding via Cortrak tube: Osmolite 1.2 at 45 ml/h (1080 ml per day) Prosource TF 60 ml daily   Provides 1296 kcal, 60 gm protein, 875 ml free water daily  Monitor magnesium and phosphorus every 12 hours x 4 occurrences, MD to replete as needed, as pt is at risk for refeeding syndrome given malnutrition.   NUTRITION DIAGNOSIS:   Severe Malnutrition related to chronic illness as evidenced by severe muscle depletion, severe fat depletion.  GOAL:   Patient will meet greater than or equal to 90% of their needs  MONITOR:   TF tolerance, Labs  REASON FOR ASSESSMENT:   Consult Enteral/tube feeding initiation and management  ASSESSMENT:   Pt with PMH of RA, ILD, DM, s/p bilateral AKA, HLD, and malnutrition admitted 8/4 after a fall with rib fxs.   Pt discussed during ICU rounds and with RN and MD.  OG coiled in mouth and removed by RN while in pt room. Plan for cortrak tube.  Pt awake and alert on the vent.   8/8 possible aspiration, intubated 8/9 s/p cortrak placement; tip gastric per xray    Medications reviewed and include: decadron, colace, SSI, synthroid, protinix, miralax   Labs reviewed: K 5.5 CBG's: 68-141 A1C: 6.2 TSH: <0.010    NUTRITION - FOCUSED PHYSICAL EXAM:  Flowsheet Row Most Recent Value  Orbital Region Severe depletion  Upper Arm Region Severe depletion  Thoracic and Lumbar Region Severe depletion  Buccal Region Unable to assess  Temple Region Severe depletion  Clavicle Bone Region Severe depletion  Clavicle and Acromion Bone Region Severe depletion  Scapular Bone Region Severe depletion  Dorsal Hand Unable to assess  Patellar Region Unable to assess  Anterior Thigh Region Unable to assess  Posterior Calf Region Unable to assess  Edema (RD Assessment) None  Hair Reviewed  Eyes Reviewed  Mouth Reviewed   Skin Reviewed  Nails Unable to assess       Diet Order:   Diet Order             Diet NPO time specified  Diet effective now                   EDUCATION NEEDS:   No education needs have been identified at this time  Skin:  Skin Assessment: Reviewed RN Assessment  Last BM:  unknown  Height:   Ht Readings from Last 1 Encounters:  03/22/22 '5\' 2"'$  (1.575 m)    Weight:   Wt Readings from Last 1 Encounters:  03/22/22 40 kg     Estimated Nutritional Needs:   Kcal:  1200-1400  Protein:  60-70 grams  Fluid:  >1.5 L/day   Lockie Pares., RD, LDN, CNSC See AMiON for contact information

## 2022-03-27 NOTE — Progress Notes (Signed)
   NAME:  [name], MRN:  [id number], DOB:  30-Nov-1945, LOS: 3 ADMISSION DATE:  03/22/2022, CONSULTATION DATE:  8/8 REFERRING MD:  Cain Sieve, CHIEF COMPLAINT:  Dyspnea   History of Present Illness:  76 y/o female with multiple medical problems including underlying ILD was admitted on 8/4 for chest pain after a fall.  On 8/8 while in the hospital was noted to have profound hypoxemic respiratory failure felt to be related to aspiration pneumonia, required intubation and mechanical ventilation.  Pertinent  Medical History  RA ILD DM2 S/p Bilateral AKA Hyperlipidemia Frailty Severe protein calorie malnutrition Severe physical deconditioning  Significant Hospital Events: Including procedures, antibiotic start and stop dates in addition to other pertinent events   8/4: Admitted to [facility] with rib fractures 8/8: PCCM consulted for acute hypoxic respiratory failure, intubated, resp culture gpc in clusters  Interim History / Subjective:  Intubated yesterday Received lokelma for hyperkalemia Received blood transfusion for low hgb without bleeding No cuff leak Wants the tube out  Objective   Blood pressure (!) 126/59, pulse 96, temperature 100.2 F (37.9 C), temperature source Axillary, resp. rate 16, height '5\' 2"'$  (1.575 m), weight 40 kg, SpO2 100 %.    Vent Mode: PRVC FiO2 (%):  [40 %-100 %] 40 % Set Rate:  [16 bmp-18 bmp] 16 bmp Vt Set:  [400 mL] 400 mL PEEP:  [5 cmH20] 5 cmH20 Plateau Pressure:  [17 cmH20-20 cmH20] 18 cmH20   Intake/Output Summary (Last 24 hours) at 03/27/2022 9150 Last data filed at 03/27/2022 0600 Gross per 24 hour  Intake 1511.56 ml  Output 750 ml  Net 761.56 ml   Filed Weights   03/22/22 0256  Weight: 40 kg    Examination: General:  In bed on vent HENT: NCAT ETT in place, see MSK exam PULM: RHonchi bilaterally B, vent supported breathing CV: RRR, no mgr GI: BS+, soft, nontender VWP:VXYI severely flexed and contracted, AKA Neuro: sedated on  vent   Resolved Hospital Problem list    Assessment & Plan:  Acute hypoxemic respiratory failure due to aspiration of food Aspiration pneumonia Underlying ILD and Rheumatoid arthritis No cuff leak> laryngeal swelling? Full mechanical vent support VAP prevention Daily WUA/SBT Unasyn to continue D/c linezolid Start decadron for possible airway edema  Acute metabolic encephalopathy due to hypercarbia Need for sedation for mechanical ventilation RASS goal 0 to -1 Fentanyl prn per PAD protocol  Acute on chronic kidney disease Hyperkalemia, given lokelma Monitor BMET and UOP Replace electrolytes as needed  Normocytic anemia without bleeding Monitor for bleeding Transfuse PRBC for Hgb < 7 gm/dL  DM2 SSI  Hypertension Monitor off BP meds for now  Hypothyroidism synthroid  Rheumatoid arthritis Hold prednisone while on decadron  History of AKA  Best Practice (right click and "Reselect all SmartList Selections" daily)   Diet/type: tubefeeds DVT prophylaxis: LMWH GI prophylaxis: PPI Lines: N/A Foley:  N/A Code Status:  full code Last date of multidisciplinary goals of care discussion [8/8]  Critical care time: 35 minutes    Roselie Awkward, MD [place] PCCM Pager: [phone] Cell: [phone] After 7:00 pm call [place]  [phone]

## 2022-03-27 NOTE — Progress Notes (Signed)
[facility] Progress Note Patient Name: [name] DOB: 1946-02-16 MRN: [id number]   Date of Service  03/27/2022  HPI/Events of Note  K+ down to 5.5 following treatment with Lokelma.  eICU Interventions  Continue to trend K+ and offer additional doses of Lokelma if indicated.        Kerry Kass Analeya Luallen 03/27/2022, 4:09 AM

## 2022-03-27 NOTE — TOC Progression Note (Signed)
Transition of Care [facility]) - Progression Note    Patient Details  Name: [name] MRN: [id number] Date of Birth: [birth year]  Transition of Care [facility]) CM/SW [place], LCSW Phone Number: 03/27/2022, 10:12 AM  Clinical Narrative:    Patient transferred to ICU. CSW will continue to follow.    Expected Discharge Plan: [place] Barriers to Discharge: No Barriers Identified  Expected Discharge Plan and Services Expected Discharge Plan: [place] Choice: [place] arrangements for the past 2 months: [place]                                       Social Determinants of Health (SDOH) Interventions    Readmission Risk Interventions     No data to display

## 2022-03-27 NOTE — Progress Notes (Signed)
SLP Cancellation Note  Patient Details Name: [name] MRN: [id number] DOB: 1946-02-01   Cancelled treatment:       Reason Eval/Treat Not Completed: Medical issues which prohibited therapy (Pt currently on the vent. SLP will follow up on a subsequent date.)  Massai Hankerson I. Hardin Negus, [place], [place] Office number [phone]  Horton Marshall 03/27/2022, 8:07 AM

## 2022-03-27 NOTE — TOC Progression Note (Signed)
Transition of Care [facility]) - Progression Note    Patient Details  Name: [name] MRN: [id number] Date of Birth: 12/28/1945  Transition of Care [facility]) CM/SW [place], LCSW Phone Number: 03/27/2022, 4:34 PM  Clinical Narrative:    CSW spoke with [name], SW at [facility]. She stated they do not have any documentation of any POA paperwork for patient but that she has a signed document from patient designating her grandson, [name], as her designated representative for any needed Mud[place]. She has also expressed here during this admission that [name] is the person she trusts the most.    [name] stated that [place] Barrett lives in [place] and is not one of the ones in the home that has been "neglectful", he just does not have a lot of time to assist patient. She stated the two that are in the home being neglectful are [name] and [name]. APS recently had closed the case because they thought home care was being put into place, which has not been able to happen. [name] stated that she will advocate for SNF placement at discharge.    Expected Discharge Plan: [place] Barriers to Discharge: No Barriers Identified  Expected Discharge Plan and Services Expected Discharge Plan: [place] Choice: [place] arrangements for the past 2 months: [place]                                       Social Determinants of Health (SDOH) Interventions    Readmission Risk Interventions     No data to display

## 2022-03-27 NOTE — Progress Notes (Signed)
Patient alert and weaning however, no audible cuff leak noted or change in inspiratory/expiratory volumes with cuff down.  Placed back on full support.Dr [place] Bells aware.

## 2022-03-28 DIAGNOSIS — S2242XA Multiple fractures of ribs, left side, initial encounter for closed fracture: Secondary | ICD-10-CM | POA: Diagnosis not present

## 2022-03-28 DIAGNOSIS — E43 Unspecified severe protein-calorie malnutrition: Secondary | ICD-10-CM | POA: Insufficient documentation

## 2022-03-28 LAB — GLUCOSE, CAPILLARY
Glucose-Capillary: 335 mg/dL — ABNORMAL HIGH (ref 70–99)
Glucose-Capillary: 306 mg/dL — ABNORMAL HIGH (ref 70–99)
Glucose-Capillary: 248 mg/dL — ABNORMAL HIGH (ref 70–99)
Glucose-Capillary: 221 mg/dL — ABNORMAL HIGH (ref 70–99)
Glucose-Capillary: 181 mg/dL — ABNORMAL HIGH (ref 70–99)
Glucose-Capillary: 214 mg/dL — ABNORMAL HIGH (ref 70–99)

## 2022-03-28 LAB — CBC WITH DIFFERENTIAL/PLATELET
Abs Immature Granulocytes: 0.1 10*3/uL — ABNORMAL HIGH (ref 0.00–0.07)
Basophils Absolute: 0 10*3/uL (ref 0.0–0.1)
Basophils Relative: 0 %
Eosinophils Absolute: 0 10*3/uL (ref 0.0–0.5)
Eosinophils Relative: 0 %
HCT: 29.9 % — ABNORMAL LOW (ref 36.0–46.0)
Hemoglobin: 9.3 g/dL — ABNORMAL LOW (ref 12.0–15.0)
Immature Granulocytes: 1 %
Lymphocytes Relative: 7 %
Lymphs Abs: 0.7 10*3/uL (ref 0.7–4.0)
MCH: 28.6 pg (ref 26.0–34.0)
MCHC: 31.1 g/dL (ref 30.0–36.0)
MCV: 92 fL (ref 80.0–100.0)
Monocytes Absolute: 0.1 10*3/uL (ref 0.1–1.0)
Monocytes Relative: 1 %
Neutro Abs: 9.4 10*3/uL — ABNORMAL HIGH (ref 1.7–7.7)
Neutrophils Relative %: 91 %
Platelets: 296 10*3/uL (ref 150–400)
RBC: 3.25 MIL/uL — ABNORMAL LOW (ref 3.87–5.11)
RDW: 14.3 % (ref 11.5–15.5)
WBC: 10.3 10*3/uL (ref 4.0–10.5)
nRBC: 0 % (ref 0.0–0.2)

## 2022-03-28 LAB — PHOSPHORUS
Phosphorus: 4 mg/dL (ref 2.5–4.6)
Phosphorus: 3.3 mg/dL (ref 2.5–4.6)

## 2022-03-28 LAB — BASIC METABOLIC PANEL
Anion gap: 7 (ref 5–15)
BUN: 67 mg/dL — ABNORMAL HIGH (ref 8–23)
CO2: 21 mmol/L — ABNORMAL LOW (ref 22–32)
Calcium: 8.9 mg/dL (ref 8.9–10.3)
Chloride: 115 mmol/L — ABNORMAL HIGH (ref 98–111)
Creatinine, Ser: 1.71 mg/dL — ABNORMAL HIGH (ref 0.44–1.00)
GFR, Estimated: 31 mL/min — ABNORMAL LOW (ref >60)
Glucose, Bld: 324 mg/dL — ABNORMAL HIGH (ref 70–99)
Potassium: 4.6 mmol/L (ref 3.5–5.1)
Sodium: 143 mmol/L (ref 135–145)

## 2022-03-28 LAB — CULTURE, RESPIRATORY W GRAM STAIN: Gram Stain: NONE SEEN

## 2022-03-28 LAB — MAGNESIUM
Magnesium: 2.8 mg/dL — ABNORMAL HIGH (ref 1.7–2.4)
Magnesium: 2.8 mg/dL — ABNORMAL HIGH (ref 1.7–2.4)

## 2022-03-28 MED ORDER — PREDNISONE 5 MG PO TABS: 5.0000 mg | ORAL_TABLET | Freq: Every day | ORAL | Status: DC

## 2022-03-28 MED ORDER — LINEZOLID 600 MG/300ML IV SOLN
600.0000 mg | Freq: Two times a day (BID) | INTRAVENOUS | Status: AC
  Administered 2022-03-28 – 2022-04-01 (×10): 600 mg via INTRAVENOUS
  Filled 2022-03-28 (×11): qty 300

## 2022-03-28 MED ORDER — INSULIN ASPART 100 UNIT/ML IJ SOLN
5.0000 [IU] | INTRAMUSCULAR | Status: DC
Start: 2022-03-28 — End: 2022-03-30
  Administered 2022-03-28 – 2022-03-30 (×13): 5 [IU] via SUBCUTANEOUS

## 2022-03-28 MED ORDER — PREDNISONE 5 MG PO TABS
5.0000 mg | ORAL_TABLET | Freq: Every day | ORAL | Status: DC
Start: 2022-03-29 — End: 2022-04-01
  Administered 2022-03-29 – 2022-03-31 (×3): 5 mg
  Filled 2022-03-28 (×4): qty 1

## 2022-03-28 NOTE — TOC Progression Note (Addendum)
Transition of Care [facility]) - Progression Note    Patient Details  Name: [name] MRN: [id number] Date of Birth: [birth year]  Transition of Care [facility]) CM/SW [place], LCSW Phone Number: 03/28/2022, 9:54 AM  Clinical Narrative:    CSW spoke with APS [place] [phone]) and confirmed that patient does not have a state guardian. She reported that patient has always had capacity to make her own decisions. They closed case on patient due to plan to get patient to a SNF. She reported if plan changes and patient returns home they can become involved again but patient has never agreed to having her family members help her in the home/would not take action against them.    Expected Discharge Plan: [place] Barriers to Discharge: No Barriers Identified  Expected Discharge Plan and Services Expected Discharge Plan: [place] Choice: [place] arrangements for the past 2 months: [place]                                       Social Determinants of Health (SDOH) Interventions    Readmission Risk Interventions     No data to display

## 2022-03-28 NOTE — Progress Notes (Signed)
a  NAME:  [name], MRN:  [id number], DOB:  [birth year], LOS: 4 ADMISSION DATE:  03/22/2022, CONSULTATION DATE:  8/8 REFERRING MD:  Cain Sieve, CHIEF COMPLAINT:  Dyspnea   History of Present Illness:  76 y/o female with multiple medical problems including underlying ILD was admitted on 8/4 for chest pain after a fall.  On 8/8 while in the hospital was noted to have profound hypoxemic respiratory failure felt to be related to aspiration pneumonia, required intubation and mechanical ventilation.  Pertinent  Medical History  RA ILD DM2 S/p Bilateral AKA Hyperlipidemia Frailty Severe protein calorie malnutrition Severe physical deconditioning  Significant Hospital Events: Including procedures, antibiotic start and stop dates in addition to other pertinent events   8/4: Admitted to [facility] with rib fractures 8/8: PCCM consulted for acute hypoxic respiratory failure, intubated, resp culture gpc in clusters 8/9 no cuff leak started decadron  Interim History / Subjective:   Awake, alert Has a cuff leak Passing SBT  Objective   Blood pressure (!) 144/74, pulse (!) 104, temperature 99 F (37.2 C), temperature source Axillary, resp. rate 15, height '5\' 2"'$  (1.575 m), weight 40 kg, SpO2 100 %.    Vent Mode: PRVC FiO2 (%):  [30 %-40 %] 30 % Set Rate:  [16 bmp] 16 bmp Vt Set:  [400 mL] 400 mL PEEP:  [5 cmH20] 5 cmH20 Plateau Pressure:  [17 cmH20-19 cmH20] 19 cmH20   Intake/Output Summary (Last 24 hours) at 03/28/2022 0160 Last data filed at 03/28/2022 0600 Gross per 24 hour  Intake 919.08 ml  Output 1025 ml  Net -105.92 ml   Filed Weights   03/22/22 0256  Weight: 40 kg    Examination:  General:  In bed on vent HENT: NCAT neck flexed, rotated R, ETT in place PULM: CTA B, vent supported breathing CV: RRR, no mgr GI: BS+, soft, nontender MSK: normal bulk and tone Neuro: wakes, up, answers questions   Resolved Hospital Problem list    Assessment & Plan:  Acute hypoxemic  respiratory failure due to aspiration of food > improved, passing SBT 8/10 Aspiration pneumonia Underlying ILD and Rheumatoid arthritis No cuff leak> laryngeal swelling? Improved 8/10 Full mechanical vent support VAP prevention Daily WUA/SBT Unasyn to continue  Stop decadron F/u resp culture Need to clarify code status with family: see discussion below  Acute metabolic encephalopathy due to hypercarbia Need for sedation for mechanical ventilation RASS goal 0 to -1 Fentanyl prn per PAD protocol  Acute on chronic kidney disease Hyperkalemia> improved Monitor BMET and UOP Replace electrolytes as needed Hold losartan  Normocytic anemia without bleeding Monitor for bleeding Transfuse PRBC for Hgb < 7 gm/dL  DM2 with hyperglycemia worse on decadron SSI to continue Add tube feeding coverage insulin Stop decadron  Hypertension Monitor off BP meds  Hypothyroidism Synthroid to continue  Rheumatoid arthritis Prednisone to restart tomorrow  History of bilateral AKA, wheelchair bound Frailty Severe protein calorie malnutrition  Prognosis is poor but she has made some improvement from a respiratory standpoint.  Best Practice (right click and "Reselect all SmartList Selections" daily)   Diet/type: tubefeeds DVT prophylaxis: LMWH GI prophylaxis: PPI Lines: N/A Foley:  N/A Code Status:  full code Last date of multidisciplinary goals of care discussion [8/9 Jasai Sorg with grandson [name].  He says that he thinks she should be DNR but needs consensus among the other grandchildren.  He also doesn't know if there is an appointed guardian from the state.  [facility].  We are reaching out to [facility] now to clarify this. If no guardian then we will ask [name] to put together a family conversation.]  Critical care time: 6 minutes    Roselie Awkward, MD [place] PCCM Pager: [phone] Cell: [phone] After 7:00 pm call [place]  [phone]

## 2022-03-28 NOTE — Progress Notes (Signed)
SLP Cancellation Note  Patient Details Name: [name] MRN: [id number] DOB: Jul 30, 1946   Cancelled treatment:       Reason Eval/Treat Not Completed: Medical issues which prohibited therapy - currently remains on vent. Will continue to follow.     Osie Bond., M.A. [place] Office [phone]  Secure chat preferred  03/28/2022, 8:01 AM

## 2022-03-28 NOTE — Progress Notes (Signed)
Physical Therapy Treatment Patient Details Name: [name] MRN: [id number] DOB: [birth year] Today's Date: 03/28/2022   History of Present Illness Patient is a 76 y/o female who presents 03/22/22 with left rib fxs 5-8 s/p fall from her w/c. Intubated 8/8-8/10. PMH includes bil AKAs, schizotypal personality, HTN, DM, retinal detachment.    PT Comments    Pt now extubated and engaging in conversation. Pt tangential at times. Pt requiring modA for bed mobility with noted core and cervical extensor muscle weakness as pt would maintain a cervical and trunk flexion position at rest and in sitting. Focused session on progressing pt's strength in her core and legs in sitting EOB. Will continue to follow acutely. Current recommendations remain appropriate.     Recommendations for follow up therapy are one component of a multi-disciplinary discharge planning process, led by the attending physician.  Recommendations may be updated based on patient status, additional functional criteria and insurance authorization.  Follow Up Recommendations  Skilled nursing-short term rehab (<3 hours/day) Can patient physically be transported by private vehicle: No   Assistance Recommended at Discharge Frequent or constant Supervision/Assistance  Patient can return home with the following Two people to help with walking and/or transfers;Direct supervision/assist for financial management;A lot of help with bathing/dressing/bathroom;Assistance with cooking/housework;Direct supervision/assist for medications management;Assist for transportation   Equipment Recommendations  None recommended by PT    Recommendations for Other Services       Precautions / Restrictions Precautions Precautions: Fall;Other (comment) Precaution Comments: bil AKAs; watch vitals Restrictions Weight Bearing Restrictions: No     Mobility  Bed Mobility Overal bed mobility: Needs Assistance Bed Mobility: Supine to Sit, Sit to Supine      Supine to sit: HOB elevated, Mod assist Sit to supine: HOB elevated, Min assist   General bed mobility comments: Pt requesting therapist's arm to pull up on to sit up at an angle on EOB several times, modA. ModA to pivot hips with return to supine.    Transfers                   General transfer comment: attempted to scoot laterally in bed while sitting but pt unable    Ambulation/Gait               General Gait Details: N/A   Stairs             Wheelchair Mobility    Modified Rankin (Stroke Patients Only)       Balance Overall balance assessment: Needs assistance Sitting-balance support: Feet unsupported, Single extremity supported Sitting balance-Leahy Scale: Poor Sitting balance - Comments: Min guard to sit with pt leaning onto R elbow. Postural control: Other (comment) (anterior trunk/neck flexion)     Standing balance comment: N/A                            Cognition Arousal/Alertness: Awake/alert Behavior During Therapy: WFL for tasks assessed/performed Overall Cognitive Status: No family/caregiver present to determine baseline cognitive functioning                                 General Comments: Pt following commands with increased time. Tangential speech at times and needs redirecting. Pt repetitive at times        Exercises General Exercises - Lower Extremity Hip ABduction/ADduction: AROM, Both, 10 reps, Seated Straight Leg Raises: AROM, Both, 10 reps, Seated  Other Exercises Other Exercises: scapular adduction and neck extension sitting EOB Other Exercises: modified sit-ups from elevated EOB and pt pulling with UEs on PT, 4x    General Comments General comments (skin integrity, edema, etc.): VSS on [place]      Pertinent Vitals/Pain Pain Assessment Pain Assessment: Faces Faces Pain Scale: Hurts little more Pain Location: neck Pain Descriptors / Indicators: Grimacing, Guarding, Discomfort Pain  Intervention(s): Limited activity within patient's tolerance, Monitored during session, Repositioned    Home Living                          Prior Function            PT Goals (current goals can now be found in the care plan section) Acute Rehab PT Goals Patient Stated Goal: to have orange soda PT Goal Formulation: With patient Time For Goal Achievement: 04/05/22 Potential to Achieve Goals: Fair Progress towards PT goals: Progressing toward goals    Frequency    Min 2X/week      PT Plan Current plan remains appropriate    Co-evaluation              AM-PAC PT "6 Clicks" Mobility   Outcome Measure  Help needed turning from your back to your side while in a flat bed without using bedrails?: A Lot Help needed moving from lying on your back to sitting on the side of a flat bed without using bedrails?: A Lot Help needed moving to and from a bed to a chair (including a wheelchair)?: Total Help needed standing up from a chair using your arms (e.g., wheelchair or bedside chair)?: Total Help needed to walk in hospital room?: Total Help needed climbing 3-5 steps with a railing? : Total 6 Click Score: 8    End of Session Equipment Utilized During Treatment: Oxygen Activity Tolerance: Patient tolerated treatment well Patient left: in bed;with call bell/phone within reach;with bed alarm set Nurse Communication: Mobility status PT Visit Diagnosis: Pain;Muscle weakness (generalized) (M62.81) Pain - Right/Left: Left Pain - part of body: Leg;Arm (neck)     Time: 9449-6759 PT Time Calculation (min) (ACUTE ONLY): 21 min  Charges:  $Therapeutic Exercise: 8-22 mins                     [name], PT, DPT Acute Rehabilitation Services  Office: [phone]    [name] 03/28/2022, 4:37 PM

## 2022-03-28 NOTE — Procedures (Signed)
Extubation Procedure Note  Patient Details:   Name: [name] DOB: 09-19-1945 MRN: [id number]   Airway Documentation:    Vent end date: 03/28/22 Vent end time: 1146   Evaluation  O2 sats: stable throughout Complications: No apparent complications Patient did tolerate procedure well. Bilateral Breath Sounds: Clear, Diminished   Yes  Pt extubated to [place] with RN at bedside. Positive cuff leak noted and pt is tolerating well. RT will monitor.  Cathie Olden 03/28/2022, 11:47 AM

## 2022-03-28 NOTE — Progress Notes (Signed)
Pt placed on PS/CPAP 10/5 on 30% and is tolerating well. RT will monitor.

## 2022-03-28 NOTE — Progress Notes (Signed)
Pt concerned about her purse and wallet. I brought it to her bedside and confirmed it contained what she thought was in it:  [place] card with her name on it; [place] credit card with [employer] Rascoe's name, $40 cash (2 $20s)  Outside pocket of purse: 3 x $5 bills, 16 x $1 bills ("newspaper money") Opened roll of quarters ebtEDGE card "[facility] bucks"  It is in a pt belongings bag with her clothing and cell phone. She is aware of this and fine with it staying in the room. [name] C 12:17 PM

## 2022-03-28 NOTE — IPAL (Signed)
  Interdisciplinary Goals of Care Family Meeting   Date carried out:: 03/28/2022  Location of the meeting: Phone conference  Member's involved: Physician and Family Member or next of kin Collene Mares Yolanda Bonine)  [place] or acting medical decision maker: Jimmie Molly Rascoe    Discussion: We discussed goals of care for [employer] .  We discussed the fact that she is very frail and would not survive to hospital discharge if she had CPR in the event of a cardiac arrest.  He agrees and feels that she needs to have a DNR codes status but he would like to have consensus and agreement from his family on this.  He is not designated legally as the HCPOA, but he hold POA for her estate. We discussed the fact that her respiratory status has improved and while today is the best day to try to get her off the ventilator, her risk of re-intubation remains high.  He voiced understanding and is willing to proceed with extubation.  Code status: Full Code > however there needs to be more formal discussion with family about this on Saturday when they can come in  Disposition: Continue current acute care   Time spent for the meeting: 35 minutes  Roselie Awkward 03/28/2022, 11:25 AM

## 2022-03-29 DIAGNOSIS — J690 Pneumonitis due to inhalation of food and vomit: Secondary | ICD-10-CM | POA: Diagnosis not present

## 2022-03-29 DIAGNOSIS — J9601 Acute respiratory failure with hypoxia: Secondary | ICD-10-CM | POA: Diagnosis not present

## 2022-03-29 DIAGNOSIS — Z7189 Other specified counseling: Secondary | ICD-10-CM | POA: Diagnosis not present

## 2022-03-29 DIAGNOSIS — S2242XA Multiple fractures of ribs, left side, initial encounter for closed fracture: Secondary | ICD-10-CM | POA: Diagnosis not present

## 2022-03-29 DIAGNOSIS — Z515 Encounter for palliative care: Secondary | ICD-10-CM

## 2022-03-29 LAB — CBC
HCT: 29.6 % — ABNORMAL LOW (ref 36.0–46.0)
Hemoglobin: 9.1 g/dL — ABNORMAL LOW (ref 12.0–15.0)
MCH: 28.7 pg (ref 26.0–34.0)
MCHC: 30.7 g/dL (ref 30.0–36.0)
MCV: 93.4 fL (ref 80.0–100.0)
Platelets: 292 10*3/uL (ref 150–400)
RBC: 3.17 MIL/uL — ABNORMAL LOW (ref 3.87–5.11)
RDW: 14.6 % (ref 11.5–15.5)
WBC: 20 10*3/uL — ABNORMAL HIGH (ref 4.0–10.5)
nRBC: 0 % (ref 0.0–0.2)

## 2022-03-29 LAB — COMPREHENSIVE METABOLIC PANEL
ALT: 14 U/L (ref 0–44)
AST: 15 U/L (ref 15–41)
Albumin: 2 g/dL — ABNORMAL LOW (ref 3.5–5.0)
Alkaline Phosphatase: 71 U/L (ref 38–126)
Anion gap: 4 — ABNORMAL LOW (ref 5–15)
BUN: 70 mg/dL — ABNORMAL HIGH (ref 8–23)
CO2: 23 mmol/L (ref 22–32)
Calcium: 8.9 mg/dL (ref 8.9–10.3)
Chloride: 118 mmol/L — ABNORMAL HIGH (ref 98–111)
Creatinine, Ser: 1.38 mg/dL — ABNORMAL HIGH (ref 0.44–1.00)
GFR, Estimated: 40 mL/min — ABNORMAL LOW (ref >60)
Glucose, Bld: 140 mg/dL — ABNORMAL HIGH (ref 70–99)
Potassium: 4.6 mmol/L (ref 3.5–5.1)
Sodium: 145 mmol/L (ref 135–145)
Total Bilirubin: 0.3 mg/dL (ref 0.3–1.2)
Total Protein: 7.4 g/dL (ref 6.5–8.1)

## 2022-03-29 LAB — GLUCOSE, CAPILLARY
Glucose-Capillary: 131 mg/dL — ABNORMAL HIGH (ref 70–99)
Glucose-Capillary: 132 mg/dL — ABNORMAL HIGH (ref 70–99)
Glucose-Capillary: 122 mg/dL — ABNORMAL HIGH (ref 70–99)
Glucose-Capillary: 132 mg/dL — ABNORMAL HIGH (ref 70–99)
Glucose-Capillary: 175 mg/dL — ABNORMAL HIGH (ref 70–99)

## 2022-03-29 LAB — PHOSPHORUS: Phosphorus: 2.6 mg/dL (ref 2.5–4.6)

## 2022-03-29 LAB — MAGNESIUM: Magnesium: 2.6 mg/dL — ABNORMAL HIGH (ref 1.7–2.4)

## 2022-03-29 MED ORDER — OXYCODONE HCL 5 MG PO TABS: 5.0000 mg | ORAL_TABLET | Freq: Once | ORAL | Status: DC

## 2022-03-29 MED ORDER — TRAMADOL HCL 50 MG PO TABS
50.0000 mg | ORAL_TABLET | Freq: Two times a day (BID) | ORAL | Status: DC
  Administered 2022-03-29 – 2022-03-31 (×5): 50 mg
  Filled 2022-03-29 (×5): qty 1

## 2022-03-29 MED ORDER — OXYCODONE HCL 5 MG PO TABS
5.0000 mg | ORAL_TABLET | Freq: Once | ORAL | Status: AC
  Administered 2022-03-29: 5 mg
  Filled 2022-03-29: qty 1

## 2022-03-29 MED ORDER — TRAMADOL 5 MG/ML ORAL SUSPENSION: 50.0000 mg | Freq: Two times a day (BID) | ORAL | Status: DC | PRN

## 2022-03-29 NOTE — Progress Notes (Signed)
a  NAME:  [name], MRN:  [id number], DOB:  1945/09/30, LOS: 5 ADMISSION DATE:  03/22/2022, CONSULTATION DATE:  8/8 REFERRING MD:  Cain Sieve, CHIEF COMPLAINT:  Dyspnea   History of Present Illness:  76 y/o female with multiple medical problems including underlying ILD was admitted on 8/4 for chest pain after a fall.  On 8/8 while in the hospital was noted to have profound hypoxemic respiratory failure felt to be related to aspiration pneumonia, required intubation and mechanical ventilation.  Pertinent  Medical History  RA ILD DM2 S/p Bilateral AKA Hyperlipidemia Frailty Severe protein calorie malnutrition Severe physical deconditioning  Significant Hospital Events: Including procedures, antibiotic start and stop dates in addition to other pertinent events   8/4: Admitted to [facility] with rib fractures 8/8: PCCM consulted for acute hypoxic respiratory failure, intubated, resp culture gpc in clusters 8/9 no cuff leak started decadron 8/10 extubated successfully  Interim History / Subjective:   Extubated Afebrile Complaining of chest pain at the sites of her rib fractures, received oxycodone x 1 overnight.  Otherwise on Tylenol Diarrhea, Flexi-Seal in place Dysphagia 1 diet with thin liquids per SLP evaluation I/O+ 1.8 L total S Cr 1.71 > 1.38  Objective   Blood pressure (!) 131/59, pulse (!) 101, temperature 99.4 F (37.4 C), temperature source Axillary, resp. rate 19, height '5\' 2"'$  (1.575 m), weight 40 kg, SpO2 97 %.    Vent Mode: PRVC FiO2 (%):  [30 %] 30 % Set Rate:  [16 bmp] 16 bmp Vt Set:  [400 mL] 400 mL PEEP:  [5 cmH20] 5 cmH20 Plateau Pressure:  [16 cmH20] 16 cmH20   Intake/Output Summary (Last 24 hours) at 03/29/2022 1016 Last data filed at 03/29/2022 0700 Gross per 24 hour  Intake 1545 ml  Output 1150 ml  Net 395 ml   Filed Weights   03/22/22 0256  Weight: 40 kg    Examination:  General: Chronically ill-appearing woman, some discomfort but no  distress HENT: Neck flexed, strong voice, oropharynx clear PULM: Decreased to both bases but otherwise clear, no wheezing or crackles CV: Regular, distant, no murmur GI: Nondistended, positive bowel sounds MSK: Lower extremity amputations Neuro: Awake, interacting, answering questions, follows commands.   Resolved Hospital Problem list    Assessment & Plan:  Acute hypoxemic respiratory failure due to aspiration of food > improved, extubated 8/10 Aspiration MRSA pneumonia Underlying ILD and Rheumatoid arthritis Fall with rib fractures, pain contributing to restrictive lung disease No cuff leak initially> laryngeal swelling? Improved 8/10 -Push pulmonary hygiene -Continue Unasyn for aspiration pneumonia.  Linezolid added 8/10 to cover MRSA.  Continue -Dexamethasone discontinued on 8/10 -follow respiratory cultures -Continue goals of care discussions with family, see IPAL discussions -Will work on improving pain control due to her rib fractures, careful with narcotics.  Try tramadol  Acute metabolic encephalopathy due to hypercarbia, improved Need for sedation for mechanical ventilation.  Resolved -Careful with narcotics -Push pulmonary hygiene  Acute on chronic kidney disease-improving Hyperkalemia> improved -Follow urine output, BMP -Continue to hold losartan for now, restart as serum creatinine improves -Replace electrolytes as needed  Normocytic anemia without bleeding -Follow for any evidence of bleeding, none seen thus far -Transfusion goal hemoglobin 7.0  DM2 with hyperglycemia worse on decadron -Continue sliding scale insulin coverage, now off steroids -TF insulin coverage  Hypertension -Following off BP meds currently -Add back antihypertensive regimen as blood pressure dictates  Hypothyroidism -Levothyroxine  Hyperlipidemia -Rosuvastatin  Rheumatoid arthritis -Back to prednisone 5 mg daily as of 8/11  History of bilateral AKA, wheelchair  bound Frailty Severe protein calorie malnutrition -Cleared for dysphagia 1 diet -remains on TF  Prognosis is poor but she has made some improvement from a respiratory standpoint.  Best Practice (right click and "Reselect all SmartList Selections" daily)   Diet/type: tubefeeds DVT prophylaxis: LMWH GI prophylaxis: PPI Lines: N/A Foley:  N/A Code Status:  full code Last date of multidisciplinary goals of care discussion [8/9 [place] with grandson [name].  He says that he thinks she should be DNR but needs consensus among the other grandchildren.  He also doesn't know if there is an appointed guardian from the state.  [facility]. We are reaching out to [facility] now to clarify this. If no guardian then we will ask [name] to put together a family conversation.]  Critical care time: NA     Baltazar Apo, MD, PhD 03/29/2022, 10:16 AM [facility] [phone] or if no answer before 7:00PM call [phone] For any issues after 7:00PM please call eLink [phone]

## 2022-03-29 NOTE — Consult Note (Addendum)
Palliative Medicine Inpatient Consult Note  Consulting Provider: Juanito Doom, MD  Reason for consult:   [facility] Palliative Medicine Consult  Reason for Consult? Goals of care; advanced RA, wheelchair bound, severe deconditioning.  Extubated this week but   03/29/2022  HPI:  Per intake H&P --> 76 y/o female with multiple medical problems including underlying ILD was admitted on 8/4 for chest pain after a fall.  On 8/8 while in the hospital was noted to have profound hypoxemic respiratory failure felt to be related to aspiration pneumonia, required intubation and mechanical ventilation.   Clinical Assessment/Goals of Care:  *Please note that this is a verbal dictation therefore any spelling or grammatical errors are due to the "[place]" system interpretation.  I have reviewed medical records including EPIC notes, labs and imaging, received report from bedside RN, assessed the patient who is resting in bed breathing heavily. Alert and oriented.    I met with Japneet Staggs to further discuss diagnosis prognosis, GOC, EOL wishes, disposition and options.   I introduced Palliative Medicine as specialized medical care for people living with serious illness. It focuses on providing relief from the symptoms and stress of a serious illness. The goal is to improve quality of life for both the patient and the family.  Medical History Review and Understanding:  Palliative care discussed Twanisha's history of rheumatoid arthritis, interstitial lung disease, type 2 diabetes, bilateral lower extremity amputations, and overall malnutrition.  Social History:  Threasa Beards is from [place] originally.  She is a widower as her husband died in 34 after only 34 years of marriage.  They have 3 daughters together though unfortunately Alahni's oldest and youngest daughters are deceased.  She has 1 daughter, Vania Rea with whom she has no relationship.  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  She is a woman of faith and says she is "semi-Baptist" and "semi-Methodist".  Functional and Nutritional State:  Functionally Nalley has been wheelchair dependent in the setting of her amputation.  She lives in her home with her oldest grandson Minna Merritts and youngest grandson Patent attorney.  She shares that they help her with some basic activities of daily living she does try to maintain a degree of independence.  As it sits at this point in time Derry Skill does not have much of an appetite though I shared she is getting artificial nutrition which is likely contributing to that.  Advance Directives:  A detailed discussion was had today regarding advanced directives. Liel is willing to designate her brother, Lissa Merlin and grandson [name] is her surrogate decision makers.  This information will be shared with the chaplain for formal healthcare power of attorney documentation.  Code Status:  Encouraged patient/family to consider DNR/DNI status understanding evidenced based poor outcomes in similar hospitalized patient, as the cause of arrest is likely associated with advanced chronic/terminal illness rather than an easily reversible acute cardio-pulmonary event. I explained that DNR/DNI does not change the medical plan and it only comes into effect after a person has arrested (died).  It is a protective measure to keep [place] from harming the patient in their last moments of life.   Discussion:  I reviewed her disease processes in the setting of her prolonged hospitalization.  I shared with her the concerns that I have in the setting of not only her interstitial lung disease and the fact that she required mechanical ventilation during this hospital stay but in the greater scheme of things.  We discussed  that patient has severely limited mobility and is terribly malnourished which typically are worrisome combinations.  I asked Tanae what her feelings are at this point in time regarding her  present health state and she reviews with me that she fell she hurt her ribs and she was hospitalized.  She shares with me the hope for improvement.  I was very honest with Norabelle stating that we are at a very tenuous point in her medical care and I worry that she could very well end up either on the ventilator again or possibly in a position where her survival is less likely.  Lakyra was very upset to hear this news and in all honesty quite shocked.  She asked that I pray with her and offered God's prayer.  I offer at some points for [facility] to think about inclusive of again cardiopulmonary resuscitation status, what a good end-of-life process would look like, and to further review her feelings on death and dying.  I asked Galilee  to establish what her goals are moving forward.   Tyrea then started to share with me a story about her grandson Bryson's girlfriend who has shared with Regan that she is "an old lady and it is time for her to go".  I asked what all of this meant and now he shares that her grandsons girlfriend wanted to get her out of her home so she could "continue to live doing nothing".  We reviewed that this is a serious issue and I would bring it up to the case management team for further investigation.  Discussed the importance of continued conversation with family and their  medical providers regarding overall plan of care and treatment options, ensuring decisions are within the context of the patients values and GOCs. ___________________________ Addendum:  I have called patient's brother Lissa Merlin to set up a formal meeting and I am awaiting a callback.  I spoke to patients grandson [name] and he plans to be in the hospital tomorrow. He would be glad to meet with the PMT.   Decision Maker: [place],Ellis (Brother): [phone] (Home Phone)  SUMMARY OF RECOMMENDATIONS   Full Code/full scope of care  Appreciate chaplain helping to designate a healthcare power of  attorney  Trying to arrange a family meeting with patient's brother, Lissa Merlin and grandson, [name]  Ongoing palliative care support given patient's declining health state  Code Status/Advance Care Planning: FULL CODE  Palliative Prophylaxis:  Aspiration, Bowel Regimen, Delirium Protocol, Frequent Pain Assessment, Oral Care, Palliative Wound Care, and Turn Reposition  Additional Recommendations (Limitations, Scope, Preferences): Continue current  Psycho-social/Spiritual:  Desire for further Chaplaincy support: Yes Additional Recommendations: Education on chronic disease burden   Prognosis: Overall very worrisome given advanced frailty, recent intubation, prolonged hospitalization, and malnutrition.   Discharge Planning: Unclear.   Vitals:   03/29/22 0900 03/29/22 1200  BP: (!) 144/70   Pulse: 97   Resp: (!) 27   Temp:  99.5 F (37.5 C)  SpO2: 98%     Intake/Output Summary (Last 24 hours) at 03/29/2022 1223 Last data filed at 03/29/2022 0700 Gross per 24 hour  Intake 1455 ml  Output 1150 ml  Net 305 ml   Last Weight  Most recent update: 03/22/2022  2:57 AM    Weight  40 kg (88 lb 2.9 oz)            Gen:  Eldery AA F in moderate distress HEENT: Coretrack, dry mucous membranes CV: Regular rate and rhythm  PULM:  On  15LPM [place], (+) tachypnea ABD: soft/nontender EXT: BLE AKA's Neuro: Alert and oriented x3  PPS: 20%   This conversation/these recommendations were discussed with patient primary care team, Dr. Lamonte Sakai  Total Time: 47  Billing based on MDM: High  Problems Addressed: One acute or chronic illness or injury that poses a threat to life or bodily function  Amount and/or Complexity of Data: Category 3:Discussion of management or test interpretation with external physician/other qualified health care professional/appropriate source (not separately reported)  Risks: Decision regarding hospitalization or escalation of hospital care and Decision not to  resuscitate or to de-escalate care because of poor prognosis ______________________________________________________ [place] Team Team Cell Phone: [phone] Please utilize secure chat with additional questions, if there is no response within 30 minutes please call the above phone number  Palliative Medicine Team providers are available by phone from 7am to 7pm daily and can be reached through the team cell phone.  Should this patient require assistance outside of these hours, please call the patient's attending physician.

## 2022-03-29 NOTE — Progress Notes (Signed)
Pt transferred to [place] room 1. Transferred with belonging bag which contained black "backpack" as well as specimen cup of jewelry and black cell phone. Bedside handoff with receiving RN complete.

## 2022-03-29 NOTE — Progress Notes (Signed)
eLink Physician-Brief Progress Note Patient Name: [name] DOB: July 24, 1946 MRN: [id number]   Date of Service  03/29/2022  HPI/Events of Note  Multiple liquid stools - Nursing request for Flexiseal.   eICU Interventions  Plan: Place Flexiseal.     Intervention Category Major Interventions: Other:  Lysle Dingwall 03/29/2022, 1:57 AM

## 2022-03-29 NOTE — Progress Notes (Signed)
This chaplain responded to Dr. Guerry Bruin consult for creating/updating the Pt. Advance Directive. The chaplain reviewed the chart notes and was updated by the Pt. RN-Kelly before the visit.  The Pt. is accepting of the chaplain's visit. The Pt. requests "to be moved in the bed to assist in a productive cough" and connects the chaplain with the pain in her ribs before the chaplain sat down. The chaplain communicated the request to the RN and proceeded with the visit. The Pt. is attentive and has clarity in expressing her thoughts.   The chaplain understands from the Pt. and the RN the topic of HCPOA has been discussed with the Pt.  Through the Pt. storytelling and the chaplain's reflective listening the chaplain understands the Pt. prefers not to burden the Pt. grandson-Emanuel with the role of HCPOA, because he is newly married. When the chaplain approached the subject of other trusted individuals, the Pt. shares she has brothers and sisters. The chaplain stops the conversation, when the Pt. becomes verbally and visually distressed on the topic of choosing one of her siblings to be HCPOA.    The chaplain becomes aware of the Pt. existential stress and reliance on her faith for peace when the Pt. talks about the grandchildren's vocalization of their presumed entitlement to the Pt. wealth upon her death. The Pt. emotion continues as she describes "multiple falls out of her wheel chair on to the hard concrete floor and having to call 911 herself with her cell phone after the last fall."  The Pt. faith and belief in God is intertwined in the morning's conversation. The chaplain listens as the Pt. reflects on scripture and understanding the meaning of heaven on earth. The Pt. sings the "Lord's Prayer" as an act of worship before ending the visit.  This chaplain is available for F/U spiritual care as needed.  Chaplain Sallyanne Kuster [phone]

## 2022-03-29 NOTE — Progress Notes (Signed)
Speech Language Pathology Treatment: Dysphagia  Patient Details Name: ESCARLET SAATHOFF MRN: [id number] DOB: 12-06-1945 Today's Date: 03/29/2022 Time: 7124-5809 SLP Time Calculation (min) (ACUTE ONLY): 16 min  Assessment / Plan / Recommendation Clinical Impression  Pt was seen after extubation on previous date, noting that she did pass a Yale swallow screen and previous mechanical soft diet has been resumed. SLP provided oral care and repositioning, using pillows and reverse Trendelenburg given pt's posture to ensure she is as upright as possible. Pt is very alert but still with confusion, asking me to get her door because meals on wheels usually comes to her house at this time. No overt signs of aspiration are noted with purees or thin liquids, but she declines putting her dentures in her mouth or trying anything more solid. She would like to try pureed foods for now, which I think is likely appropriate considering the potential for her mentation to still fluctuate. Per chart review, it sounds like her mentation was increasingly altered when there was concern for aspiration and oral residue. Will adjust diet to Dys 1 solids and thin liquids. Recommend careful assistance with feeding to monitor mentation, positioning, and oral clearance. SLP will continue to follow closely with low threshold for instrumental testing.   HPI HPI: Patient is a 76 y/o female who presents with left rib fxs 5-8 s/p fall from her w/c. CXR 8/6 with hazy opacities and WBC increased 8/7, concerning for early PNA. Pt was more altered on 8/8, with respiratory decline requiring intubation (extubated 8/10). Food was noted in pt's mouth with concern for aspiration. PMH includes ILD, RA, bil AKAs, schizotypal personality, HTN, DM, retinal detachment, thyroid disease.      SLP Plan  Continue with current plan of care      Recommendations for follow up therapy are one component of a multi-disciplinary discharge planning process, led by  the attending physician.  Recommendations may be updated based on patient status, additional functional criteria and insurance authorization.    Recommendations  Diet recommendations: Dysphagia 1 (puree);Thin liquid Liquids provided via: Straw Medication Administration: Crushed with puree Supervision: Staff to assist with self feeding;Full supervision/cueing for compensatory strategies Compensations: Minimize environmental distractions;Small sips/bites;Slow rate Postural Changes and/or Swallow Maneuvers: Seated upright 90 degrees;Upright 30-60 min after meal                Oral Care Recommendations: Oral care BID Follow Up Recommendations: No SLP follow up Assistance recommended at discharge: PRN SLP Visit Diagnosis: Dysphagia, unspecified (R13.10) Plan: Continue with current plan of care           Osie Bond., M.A. [place] Office [phone]  Secure chat preferred    03/29/2022, 9:25 AM

## 2022-03-29 NOTE — Progress Notes (Signed)
eLink Physician-Brief Progress Note Patient Name: [name] DOB: 14-Aug-1946 MRN: [id number]   Date of Service  03/29/2022  HPI/Events of Note  Pain - No relief with Tylenol.  eICU Interventions  Plan: Oxycodone IR 5 mg per tube X 1.     Intervention Category Major Interventions: Other:  Lysle Dingwall 03/29/2022, 4:53 AM

## 2022-03-30 DIAGNOSIS — Z515 Encounter for palliative care: Secondary | ICD-10-CM | POA: Diagnosis not present

## 2022-03-30 DIAGNOSIS — Z7189 Other specified counseling: Secondary | ICD-10-CM | POA: Diagnosis not present

## 2022-03-30 LAB — PHOSPHORUS: Phosphorus: 2.6 mg/dL (ref 2.5–4.6)

## 2022-03-30 LAB — CBC
HCT: 33.3 % — ABNORMAL LOW (ref 36.0–46.0)
Hemoglobin: 10 g/dL — ABNORMAL LOW (ref 12.0–15.0)
MCH: 28.3 pg (ref 26.0–34.0)
MCHC: 30 g/dL (ref 30.0–36.0)
MCV: 94.3 fL (ref 80.0–100.0)
Platelets: 284 10*3/uL (ref 150–400)
RBC: 3.53 MIL/uL — ABNORMAL LOW (ref 3.87–5.11)
RDW: 14.6 % (ref 11.5–15.5)
WBC: 17.4 10*3/uL — ABNORMAL HIGH (ref 4.0–10.5)
nRBC: 0 % (ref 0.0–0.2)

## 2022-03-30 LAB — BASIC METABOLIC PANEL
Anion gap: 8 (ref 5–15)
BUN: 60 mg/dL — ABNORMAL HIGH (ref 8–23)
CO2: 23 mmol/L (ref 22–32)
Calcium: 8.9 mg/dL (ref 8.9–10.3)
Chloride: 113 mmol/L — ABNORMAL HIGH (ref 98–111)
Creatinine, Ser: 1.23 mg/dL — ABNORMAL HIGH (ref 0.44–1.00)
GFR, Estimated: 46 mL/min — ABNORMAL LOW (ref >60)
Glucose, Bld: 181 mg/dL — ABNORMAL HIGH (ref 70–99)
Potassium: 4.7 mmol/L (ref 3.5–5.1)
Sodium: 144 mmol/L (ref 135–145)

## 2022-03-30 LAB — MAGNESIUM: Magnesium: 2.3 mg/dL (ref 1.7–2.4)

## 2022-03-30 LAB — GLUCOSE, CAPILLARY
Glucose-Capillary: 113 mg/dL — ABNORMAL HIGH (ref 70–99)
Glucose-Capillary: 180 mg/dL — ABNORMAL HIGH (ref 70–99)
Glucose-Capillary: 200 mg/dL — ABNORMAL HIGH (ref 70–99)
Glucose-Capillary: 238 mg/dL — ABNORMAL HIGH (ref 70–99)
Glucose-Capillary: 73 mg/dL (ref 70–99)
Glucose-Capillary: 83 mg/dL (ref 70–99)

## 2022-03-30 MED ORDER — AMPICILLIN-SULBACTAM SODIUM 1.5 (1-0.5) G IJ SOLR
1.5000 g | Freq: Two times a day (BID) | INTRAMUSCULAR | Status: DC
Start: 2022-03-30 — End: 2022-03-30

## 2022-03-30 NOTE — Progress Notes (Signed)
Palliative Medicine Inpatient Follow Up Note HPI: 76 y/o female with multiple medical problems including underlying ILD was admitted on 8/4 for chest pain after a fall.  On 8/8 while in the hospital was noted to have profound hypoxemic respiratory failure felt to be related to aspiration pneumonia, required intubation and mechanical ventilation.  Palliative care was asked to get involved to discuss goals of care.   Today's Discussion 03/30/2022  *Please note that this is a verbal dictation therefore any spelling or grammatical errors are due to the "[place] [facility]" system interpretation.  Chart reviewed inclusive of vital signs, progress notes, laboratory results, and diagnostic images.   I met with [name] at bedside this morning she was leaning to the right side of the bed and shares this is due to a "neck fracture".  She shares that she has some pain in her neck and favoring that side seems to cause some relief. We discussed that she does recall our conversation to some degree from yesterday. While speaking she complains of some abdominal pain and nausea. I stopped her tube feeding and informed her nurses, [name].    I shared that I had reached out to [name]'s brother, [name] though he has yet to call back. Reviewed that her grandson [name] is coming into the hospital today for further conversations regarding goals of care. Created space and opportunity for patient to explore thoughts feelings and fears regarding current medical situation.  At this point, [name] would like to be more mobile if possible. Discussed PT/OT consults.   [name] continued to feel poorly during our time together therefore I shared I would be back later in the day.  _________________________________________ Addendum:  I met with [name], her brother [name], her grandson, [name], and her grandson [name] this afternoon.   We discussed the reasons leading to [name]'s hospitalization inclusive of her falling  out of her wheelchair and fracturing multiple ribs. Discussed patients ILD and need for ventilatory support in the setting of respiratory failure.   Conversation help regarding patients wishes in regard to cardiopulmonary resuscitation status.  I shared what resuscitation truly looks like inclusive of possible trauma, fractures, worse functional ability after an event than a patient was prior.  I shared and [name]'s case I would be especially concerned that we would likely cause quite a bit of harm with little benefit.[name] is fairly sure about the desire for all resuscitative efforts and if on support there is not a good chance of recovery she would wish for her grandson [name] to make that determination.  We discussed the concepts of artificial nutrition and hydration.  I shared that if [name] does not have sufficient oral intake then it would likely result in worsening malnutrition.  We discussed in brief the consideration of an artificial means of support such as gastric tube feeding.  Right now now he does have a short-term core track in place though I asked her if she would ever desire something longer-term.  She expresses that she would need time to think about this and would speak to her family about a more.  Reviewed the concerns about patient's living situation.  Patient's grandson, [name] shares that the woman who had been living in the house and making threatening comments has been addressed and is no longer there.  He acknowledges how distressing that was for [name].  [name] shares that he is willing to be the second person on the healthcare power of attorney forms though would desire [name] to  be the first person.  [name] consents to this.  I shared that the healthcare power of attorney forms will be completed on Monday by her chaplain, [name].  Questions and concerns addressed/Palliative Support Provided.   Objective Assessment: Vital Signs Vitals:   03/30/22 0416 03/30/22 0803  BP:  (!) 163/80 (!) 164/71  Pulse: 100 97  Resp: 15 17  Temp: 98 F (36.7 C) 97.8 F (36.6 C)  SpO2: 96% 94%    Intake/Output Summary (Last 24 hours) at 03/30/2022 1147 Last data filed at 03/30/2022 7218 Gross per 24 hour  Intake 795 ml  Output 1100 ml  Net -305 ml   Last Weight  Most recent update: 03/22/2022  2:57 AM    Weight  40 kg (88 lb 2.9 oz)            Gen:  Eldery AA F in moderate distress HEENT: Coretrack, dry mucous membranes CV: Regular rate and rhythm  PULM:  On 3LPM [place], unlabored breathing ABD: soft/nontender EXT: BLE AKA's Neuro: Alert and oriented x2-3  SUMMARY OF RECOMMENDATIONS   Full Code/full scope of care   Appreciate chaplain helping to designate a healthcare power of attorney --> patient elects grandson [name] as first Air traffic controller and oldest brother, [name] as second Ambulance person   Family meeting held this afternoon emphasized the importance of considering quality of life and concepts such as cardiopulmonary resuscitation status and artificial nutrition  Have reached out to [facility] MSW --> [name] [phone] to continue conversations upon discharge   Ongoing palliative care support given patient's declining health state  Time Spent 10  Billing based on MDM: High  Problems Addressed: One acute or chronic illness or injury that poses a threat to life or bodily function  Amount and/or Complexity of Data: Category 3:Discussion of management or test interpretation with external physician/other qualified health care professional/appropriate source (not separately reported)  Risks: Decision not to resuscitate or to de-escalate care because of poor prognosis ______________________________________________________________________________________ [name] Team Team Cell Phone: [phone] Please utilize secure chat with additional questions, if there is no response within 30 minutes please call  the above phone number  Palliative Medicine Team providers are available by phone from 7am to 7pm daily and can be reached through the team cell phone.  Should this patient require assistance outside of these hours, please call the patient's attending physician.

## 2022-03-30 NOTE — Progress Notes (Addendum)
Summary: Jahyra Ruddis a chronically ill wheelchair bound 76 year old female with PMH of ILD, bilateral AKA 2/2 diabetic wounds in 2021, T2DM, HTN, RA on chronic prednisone who was admitted for left sided rib fractures following a fall from her wheelchair on 8/4. She as noted to have worsening respiratory status on 8/7. Mental status declined on 8/8 with increasing oxygen need. ABG with respiratory acidoses and severe hypoxia. PCCM was consulted for respiratory failure in the setting of pneumonia due to rib splinting and possible aspiration pneumonia. She was intubated for airway protection given neck contracture and transferred to the ICU. She was treated with antibiotics for MRSA pneumonia and decadron due to airway edema now back on prednisone. Extubated on 8/10 and transferred out of ICU last night.  Subjective: Patient evaluated at beside. She feels she needs to move her bowels this morning. Initially disoriented to place and situation, but quickly recalled she is in the hospital after falling of her wheelchair. Also have some discomfort from feeding tube and nausea associated with this. Denies abdominal pain. Has difficulty with taking in a deep breath with left sided chest wall pain. Has tried to call family but did not bring her Pensions consultant.   Objective:  Vital signs in last 24 hours: Vitals:   03/29/22 1700 03/29/22 1800 03/29/22 1956 03/30/22 0416  BP: (!) 154/77 (!) 155/88 (!) 153/76 (!) 163/80  Pulse: (!) 101 (!) 103 99 100  Resp: '19 20 20 15  '$ Temp:   98.3 F (36.8 C) 98 F (36.7 C)  TempSrc:   Axillary   SpO2: 100% 100% 99% 96%  Weight:      Height:       Constitutional: chronically ill, frail appear woman sitting hunched in bed leaning to the right. HENT: Cortrak in place, neck contracture Cardiovascular: Normal rate, regular rhythm, no murmurs, rubs, or gallops Respiratory: shallow breathing, crackles from mid to lower lungs, no wheezing GI: Nondistended, soft, non  tender, active bowel sounds Musculoskeletal: bilateral AKA, mild tenderness to palpation of the left ear Neurological:alert, oriented to person, initially confused about date and place but quickly recalled.  Skin: Warm and dry.   Psychiatric: Normal mood and affect.   Assessment/Plan:  Principal Problem:   Multiple rib fractures Active Problems:   Hypothyroidism   HYPERTENSION, BENIGN SYSTEMIC   Rheumatoid arthritis (HCC)   S/P AKA (above knee amputation) bilateral (HCC)   Controlled type 2 diabetes mellitus with stable proliferative retinopathy of both eyes, with long-term current use of insulin (HCC)   ILD (interstitial lung disease) ([place])   AKI (acute kidney injury) ([place])   Acute respiratory failure with hypoxia (HCC)   Protein-calorie malnutrition, severe   Acute hypoxic and hypercarbic respiratory failure in the setting of MRSA pneumonia and splinting from rib fractures History of ILD Patient intubated for airway protection after found to have worsening hypoxic and hypercarbic respiratory failure on 8/8. Treated with vacn and cefepime, then transitioned to unasyn for aspiration pneumonia. Respiratory culture with MRSA and antibiotics switched to linezolid on 8/10. Also received 2 days of decadron for airway edema, now back on chronic prednisone for RA. Successfully extubated on 8/10 now on 4L . Mentation much improved. -continue linezolid on day 2/5 -encourage pulmonary hygiene with IS -Monitor CBC, trend fever curve -O2 to maintain satws >92% -Continue GOC discussions  Multiple left rib fractures Wheelchair dependent s/p bilateral AKA Admitted after fall. Still having left sided pain. - continue tylenol and tramadol PRN pain. -lidocaine patch  for rib pain q12H  AKI  Hyperkalemia - resolved Unclear baseline Cr. Creatinine 1.55 on admission and up to 2. Now improving, BUN 60 and sCR 1.23 today. Hyperkalemia resolved after lokelma, 4.7 today - monitor BMP - I/O -avoid  nephrotoxins  Diarrhea Flexiseal placed on 8/11 for diarrhea. No reports diarrhea overnight or this morning. Patient endorses constipation but feels she need to have a BM this morning.  -continue to [facility]   Normocytic anemia S/p 1 unit pRBC on 8/8. Hgb stable at 10 today.  -Ordered 1 unit of pRBC to transfuse -CBC reordered   Type 2 diabetes A1c 6.2 on admission. Elevated glucoses while on decadron for airway edema. CBG in 80s today.  - Will continue sliding scale q4 while on tube feeds, will discontinue q4 tube feed coverage for now - Will continue CBG monitoring   Hypertension Home amlodipine 10 mg held for low pressures. BP this morning 160s/80s.  - Continue to monitor if persistently elevated will restart amlodipine.  Protein calorie malnutrition Now on dysphagia 1 diet. Patient reluctant to eat as she is worried she will have a BM. Discussed importance of good nutrition. She is agreeable to trying soups. - Continue TF with osmolite and prosource - Encourage PO intake, if doing well without aspiration hope to be able to remove cortrak  Rheumatoid arthritis - continue prednisone 5 mg daily   Hyperlipidemia - Continue rosuvastatin 20 mg   Hypothyroidism - Continue levothyroxine 150 mcg daily  Goals of care Ongoing GOC given her chronic conditions and frailty. PCCM discussed with grandson [name] who feels patient should be DNR however would like family consensus on this. Palliative spoke with patient yesterday to discuss code status and goals of care. Currently remains full code with full scope of care. Plan for family meeting today with grandson [name] and also hopefully brother Lissa Merlin.  -Appreciate recommendation from palliative medicine   Prior to Admission Living Arrangement: Home Anticipated Discharge Location: Pending further goals of care Barriers to Discharge: Medical improvement Dispo: Anticipated discharge in approximately 2 day(s).   Iona Beard,  MD 03/30/2022, 6:59 AM Pager: [phone] After 5pm on weekdays and 1pm on weekends: On Call pager [phone]

## 2022-03-31 DIAGNOSIS — Z7189 Other specified counseling: Secondary | ICD-10-CM | POA: Diagnosis not present

## 2022-03-31 DIAGNOSIS — Z515 Encounter for palliative care: Secondary | ICD-10-CM | POA: Diagnosis not present

## 2022-03-31 LAB — CBC
HCT: 41.5 % (ref 36.0–46.0)
Hemoglobin: 12.3 g/dL (ref 12.0–15.0)
MCH: 27.8 pg (ref 26.0–34.0)
MCHC: 29.6 g/dL — ABNORMAL LOW (ref 30.0–36.0)
MCV: 93.9 fL (ref 80.0–100.0)
Platelets: 298 10*3/uL (ref 150–400)
RBC: 4.42 MIL/uL (ref 3.87–5.11)
RDW: 14.1 % (ref 11.5–15.5)
WBC: 16.8 10*3/uL — ABNORMAL HIGH (ref 4.0–10.5)
nRBC: 0 % (ref 0.0–0.2)

## 2022-03-31 LAB — GLUCOSE, CAPILLARY
Glucose-Capillary: 138 mg/dL — ABNORMAL HIGH (ref 70–99)
Glucose-Capillary: 197 mg/dL — ABNORMAL HIGH (ref 70–99)
Glucose-Capillary: 200 mg/dL — ABNORMAL HIGH (ref 70–99)
Glucose-Capillary: 247 mg/dL — ABNORMAL HIGH (ref 70–99)
Glucose-Capillary: 85 mg/dL (ref 70–99)
Glucose-Capillary: 98 mg/dL (ref 70–99)

## 2022-03-31 LAB — BASIC METABOLIC PANEL
Anion gap: 5 (ref 5–15)
BUN: 49 mg/dL — ABNORMAL HIGH (ref 8–23)
CO2: 23 mmol/L (ref 22–32)
Calcium: 8.8 mg/dL — ABNORMAL LOW (ref 8.9–10.3)
Chloride: 111 mmol/L (ref 98–111)
Creatinine, Ser: 1.1 mg/dL — ABNORMAL HIGH (ref 0.44–1.00)
GFR, Estimated: 52 mL/min — ABNORMAL LOW (ref >60)
Glucose, Bld: 185 mg/dL — ABNORMAL HIGH (ref 70–99)
Potassium: 4.9 mmol/L (ref 3.5–5.1)
Sodium: 139 mmol/L (ref 135–145)

## 2022-03-31 MED ORDER — ORAL CARE MOUTH RINSE
15.0000 mL | OROMUCOSAL | Status: DC
  Administered 2022-04-01 – 2022-04-03 (×11): 15 mL via OROMUCOSAL

## 2022-03-31 MED ORDER — OXYCODONE HCL 5 MG PO TABS: 2.5000 mg | ORAL_TABLET | Freq: Four times a day (QID) | ORAL | Status: DC | PRN

## 2022-03-31 MED ORDER — ORAL CARE MOUTH RINSE: 15.0000 mL | OROMUCOSAL | Status: DC | PRN

## 2022-03-31 MED ORDER — AMLODIPINE BESYLATE 10 MG PO TABS
10.0000 mg | ORAL_TABLET | Freq: Every day | ORAL | Status: DC
  Administered 2022-03-31 – 2022-04-03 (×4): 10 mg via ORAL
  Filled 2022-03-31 (×4): qty 1

## 2022-03-31 MED ORDER — PANTOPRAZOLE SODIUM 40 MG IV SOLR
40.0000 mg | Freq: Two times a day (BID) | INTRAVENOUS | Status: DC
  Administered 2022-03-31 – 2022-04-03 (×7): 40 mg via INTRAVENOUS
  Filled 2022-03-31 (×8): qty 10

## 2022-03-31 NOTE — Progress Notes (Addendum)
Subjective: I was notified by the nurse that the patient was vomiting with coffee-ground emesis.  I went to assess the patient.  Patient has residual thin dark brown/black liquid on the corner of her mouth, the towels near her mouth, and in her vomit bag.  She was not actively vomiting.  Patient was also complaining of abdominal pain.  Objective: VS: Tachycardic (HR 110), Hypertensive (BP 186/85), RR 20, SpO2 100% on 2L [place], afebrile (97.6 F)  Physical Exam: Constitutional:       General: Not in acute distress.     Comments: appears chronically-ill, appears somnolent with most responses comprehensible, not actively vomiting.  Cardiovascular:     Rate and Rhythm: Normal rate and regular rhythm. No murmurs, rubs, or gallops.  Pulmonary:     Effort: Pulmonary effort is normal.     Comments: Shallow breathing due to pain with inspiration/expiration. Minimal crackles at the lung bases bilaterally. Abdominal:     General: Bowel sounds are normal. No distension. Tenderness to palpation of the RUQ, RLQ, LUQ, and epigastric region.  Musculoskeletal:     Comments: Moderate tenderness to palpation of the left chest.  Bilateral AKA. Skin: Warm and dry.    Plan:  # vomiting Patient received 1 unit pRBC on 8/8 for normocytic anemia.  Hemoglobin normal today at 12.3.  Patient has been receiving Lovenox injections daily for DVT prophylaxis. Concern for potential GI bleed. - Will make patient NPO - Will remove core trak - IV Protonix BID - Hold Lovenox for now - Will consult GI

## 2022-03-31 NOTE — Progress Notes (Signed)
Palliative Medicine Inpatient Follow Up Note HPI: 76 y/o female with multiple medical problems including underlying ILD was admitted on 8/4 for chest pain after a fall.  On 8/8 while in the hospital was noted to have profound hypoxemic respiratory failure felt to be related to aspiration pneumonia, required intubation and mechanical ventilation.  Palliative care was asked to get involved to discuss goals of care.   Today's Discussion 03/31/2022  *Please note that this is a verbal dictation therefore any spelling or grammatical errors are due to the "[place] [facility]" system interpretation.  Chart reviewed inclusive of vital signs, progress notes, laboratory results, and diagnostic images.   I was asked to call patients grandson, Jimmie Molly this morning as he had some additional thoughts from our prior meeting. I called Jimmie Molly and he shares that after a family conversation the decision for the time being is for Kaden to be a DNAR/DNI code status. He shares that Sylvester has endured enough in the setting of this prolonged hospitalization and her would hate to put her through the trauma of another intubation or chest compression if her quality of life would be worse after. He did ask if she improves once out of the hospital if that could be changed and I shared that it could though I also feel the decisions for DNAR/DNI is very much within reason at this time. We discussed the tenuous state that Latise is in which [place] voiced understanding of. I shared if she should worsen or neglect to improve moving forward another meeting would need to be held.   I met with Aine at bedside this morning with Goldye, she was almost upside down in the bed. I asked the NT to help me reposition her. She was brought upright though still experiencing a great deal of pain when on her L side. We got her to a place of comfort though she did not desire to eat or drink this morning. Meygan is confused and thinks she has  fallen out of bed. I was able to reassure her and tell her that she has not fallen and she remain in her bed though her position has been modified. She does complain of some nausea this morning though denies shortness of breath.   I was able to summarize the conversation that I had with Jimmie Molly for [place] and she at this time agree's with the decisions that he and Lissa Merlin have made for her.  I confirmed Jalene's HCPOA agents and placed it in the front of her chart for notarization.   Questions and concerns addressed/Palliative Support Provided.   Objective Assessment: Vital Signs Vitals:   03/31/22 0700 03/31/22 1156  BP: (!) 175/75 (!) 186/85  Pulse: (!) 107 (!) 110  Resp: 20 20  Temp: 98 F (36.7 C) 97.6 F (36.4 C)  SpO2: 93% 100%    Intake/Output Summary (Last 24 hours) at 03/31/2022 1222 Last data filed at 03/31/2022 0300 Gross per 24 hour  Intake --  Output 600 ml  Net -600 ml    Last Weight  Most recent update: 03/22/2022  2:57 AM    Weight  40 kg (88 lb 2.9 oz)            Gen:  [place] AA F in moderate distress HEENT: Core[place], dry mucous membranes CV: Regular rate and rhythm  PULM:  On 3LPM [place], unlabored breathing ABD: soft/nontender EXT: BLE AKA's Neuro: Alert and oriented x2-3  SUMMARY OF RECOMMENDATIONS   DNAR/DNI   Appreciate chaplain  helping to designate a healthcare power of attorney --> patient elects grandson Jimmie Molly as first Air traffic controller and oldest brother, Lissa Merlin as second Ambulance person   SLP eval pending --> Will need to keep a close eye on nutrition intake given patients frail state I worry when POing she may not meet nutrition requirements  Have reached out to [facility] [phone] to continue [place] conversations upon discharge   Ongoing palliative care support given patient's declining health state  Billing based on MDM: High  Problems Addressed: One acute or chronic illness or injury that poses a threat to  life or bodily function  Amount and/or Complexity of Data: Category 3:Discussion of management or test interpretation with external physician/other qualified health care professional/appropriate source (not separately reported)  Risks: Decision not to resuscitate or to de-escalate care because of poor prognosis ______________________________________________________________________________________ [place] Team Team Cell Phone: [phone] Please utilize secure chat with additional questions, if there is no response within 30 minutes please call the above phone number  Palliative Medicine Team providers are available by phone from 7am to 7pm daily and can be reached through the team cell phone.  Should this patient require assistance outside of these hours, please call the patient's attending physician.

## 2022-03-31 NOTE — Progress Notes (Signed)
Subjective:  Patient continues to complain of pain in her left chest secondary to previous rib fractures.  Patient is requesting that we advance her diet.  Discussed plan to have speech pathology assess her to see if we can advance her diet.  Discussed plan to have a family meeting regarding their goals of care in the near future.  Patient agreed with the plan.   Objective:  Vital signs in last 24 hours: Vitals:   03/30/22 0803 03/30/22 1543 03/30/22 1937 03/31/22 0700  BP: (!) 164/71 (!) 149/73 (!) 147/82 (!) 175/75  Pulse: 97  97 (!) 107  Resp: '17  14 20  '$ Temp: 97.8 F (36.6 C) 98.6 F (37 C)  98 F (36.7 C)  TempSrc: Oral Oral  Oral  SpO2: 94%  100% 93%  Weight:      Height:       Physical Exam: Constitutional:      General: Not in acute distress.     Comments: appears chronically-ill, appears somnolent with most responses comprehensible.  Cardiovascular:     Rate and Rhythm: Normal rate and regular rhythm. No murmurs, rubs, or gallops.  Pulmonary:     Effort: Pulmonary effort is normal.     Comments: Shallow breathing due to pain with inspiration/expiration. Minimal crackles at the lung bases bilaterally. Abdominal:     General: Bowel sounds are normal. No distension or tenderness.  Musculoskeletal:     Comments: Moderate tenderness to palpation of the left chest.  Bilateral AKA. Skin: Warm and dry.   Assessment/Plan:  Principal Problem:   Multiple rib fractures Active Problems:   Hypothyroidism   HYPERTENSION, BENIGN SYSTEMIC   Rheumatoid arthritis (HCC)   S/P AKA (above knee amputation) bilateral (HCC)   Controlled type 2 diabetes mellitus with stable proliferative retinopathy of both eyes, with long-term current use of insulin (HCC)   ILD (interstitial lung disease) ([place])   AKI (acute kidney injury) ([place])   Acute respiratory failure with hypoxia ([place])   Protein-calorie malnutrition, severe  [name] is a 76 y/o female with past medical history of RA  (not on immunosuppressive therapy or steroids), ILD (not currently on treatment), bilateral AKA secondary to diabetic ulcer (2021), T2DM, HTN, and wheelchair dependence that was admitted for pain control of multiple left-sided rib fractures after a fall from her wheelchair.   # Acute hypoxic/hypercarbic respiratory failure in the setting of MRSA pneumonia and splinting from rib fractures # History of ILD Likely aspiration pneumonia. Patient intubated on 8/8 and transferred to the ICU. Treated initially with vanc/cefepime and then transitioned to unasyn. Patient was switched to Linezolid on 8/10 following respiratory cultures positive for MRSA. Patient received 2 days of decadron but currently on prednisone. Extubated on 8/10. Oxygen saturation remains > 95 % on RA. Mentation continues to improve. WBC count improved today to 16.8. Patient remains afebrile.  - Linezolid (day 3/5) - Encourage incentive spirometer use - Monitor CBC and temperature - Continue GOC discussions with grandson Jimmie Molly and brother Lissa Merlin this week   2. # Multiple left rib fractures # Wheelchair dependent s/p bilateral AKA Continues to have left-sided rib pain. - Tylenol q8 and q6 PRN - Tramadol q12  - Lidocaine patch q12H    3. # AKI  # Hyperkalemia  Uncertain baseline creatinine. Creatinine 1.55 on admission.  Creatinine continues to improve.  Creatinine 1.1 today.  BUN also improving.  BUN 49 today. Hyperkalemia has resolved after receiving lokelma. Potassium 4.9 today - Monitor BMP  4. # Diarrhea Flexiseal placed 8/11.  No recorded bowel movement over the last 2 days. - Continue monitoring and reassess bowel regimen as necessary   5. # Normocytic anemia Received 1 unit pRBC 8/8. Hgb 12.3 today.  - Monitor CBC   6. # Type 2 DM A1c 6.2 on admission. CBGs in the 100s today.  - Continue sliding scale  - Monitor CBG   7. # Hypertension Hypertensive over the last 24 hours. BP 175/75 this morning. - Restart  home amlodipine 10 mg - Monitor BP  8. # Protein calorie malnutrition Dysphagia 1 diet currently.  - Osmolite and prosource for now - Will have speech pathology reassess today - Will encourage PO into in order to hopefully remove cortrak soon   9. # Rheumatoid arthritis - Continue prednisone 5 mg    10. # Hyperlipidemia - Continue rosuvastatin 20 mg   11. # Hypothyroidism - Continue levothyroxine 150 mcg   Prior to Admission Living Arrangement: Home with grandsons Anticipated Discharge Location: Pending goals of care discussion with family Barriers to Discharge: continued management Dispo: Anticipated discharge in approximately greater than 2 day(s).   Starlyn Skeans, MD 03/31/2022, 10:20 AM Pager: [phone] After 5pm on weekdays and 1pm on weekends: On Call pager [phone]

## 2022-03-31 NOTE — Progress Notes (Signed)
Sent secure chat to Dr. Collene Gobble and made him aware that patient had emesis and that zofran can not be given until 1400. Also made MD aware that patient is staying sinus tach around 110 on tele monitor and that she was having bigeminy this morning. MD gave order to go ahead and give zofran early, now and acknowledged heart rate and bigeminy and gave order to still discontinue tele monitoring.

## 2022-03-31 NOTE — Progress Notes (Signed)
Notified Dr. Alton Revere that patient is having coffee ground emesis. MD acknowledged and stated he would come assess patient.

## 2022-03-31 NOTE — Progress Notes (Signed)
No further emesis during shift. Patient denies nausea.

## 2022-04-01 DIAGNOSIS — J9601 Acute respiratory failure with hypoxia: Secondary | ICD-10-CM | POA: Diagnosis not present

## 2022-04-01 DIAGNOSIS — S2242XA Multiple fractures of ribs, left side, initial encounter for closed fracture: Secondary | ICD-10-CM | POA: Diagnosis not present

## 2022-04-01 DIAGNOSIS — Z7189 Other specified counseling: Secondary | ICD-10-CM | POA: Diagnosis not present

## 2022-04-01 LAB — GLUCOSE, CAPILLARY
Glucose-Capillary: 164 mg/dL — ABNORMAL HIGH (ref 70–99)
Glucose-Capillary: 143 mg/dL — ABNORMAL HIGH (ref 70–99)
Glucose-Capillary: 122 mg/dL — ABNORMAL HIGH (ref 70–99)
Glucose-Capillary: 104 mg/dL — ABNORMAL HIGH (ref 70–99)
Glucose-Capillary: 119 mg/dL — ABNORMAL HIGH (ref 70–99)
Glucose-Capillary: 188 mg/dL — ABNORMAL HIGH (ref 70–99)

## 2022-04-01 LAB — BASIC METABOLIC PANEL
Anion gap: 7 (ref 5–15)
BUN: 51 mg/dL — ABNORMAL HIGH (ref 8–23)
CO2: 23 mmol/L (ref 22–32)
Calcium: 8.7 mg/dL — ABNORMAL LOW (ref 8.9–10.3)
Chloride: 111 mmol/L (ref 98–111)
Creatinine, Ser: 1.24 mg/dL — ABNORMAL HIGH (ref 0.44–1.00)
GFR, Estimated: 45 mL/min — ABNORMAL LOW (ref >60)
Glucose, Bld: 170 mg/dL — ABNORMAL HIGH (ref 70–99)
Potassium: 4.8 mmol/L (ref 3.5–5.1)
Sodium: 141 mmol/L (ref 135–145)

## 2022-04-01 LAB — CBC
HCT: 33.7 % — ABNORMAL LOW (ref 36.0–46.0)
Hemoglobin: 9.8 g/dL — ABNORMAL LOW (ref 12.0–15.0)
MCH: 27.5 pg (ref 26.0–34.0)
MCHC: 29.1 g/dL — ABNORMAL LOW (ref 30.0–36.0)
MCV: 94.4 fL (ref 80.0–100.0)
Platelets: 290 10*3/uL (ref 150–400)
RBC: 3.57 MIL/uL — ABNORMAL LOW (ref 3.87–5.11)
RDW: 14 % (ref 11.5–15.5)
WBC: 13.4 10*3/uL — ABNORMAL HIGH (ref 4.0–10.5)
nRBC: 0 % (ref 0.0–0.2)

## 2022-04-01 MED ORDER — PROSOURCE PLUS PO LIQD
30.0000 mL | Freq: Three times a day (TID) | ORAL | Status: DC
  Administered 2022-04-01 – 2022-04-03 (×5): 30 mL via ORAL
  Filled 2022-04-01 (×5): qty 30

## 2022-04-01 MED ORDER — LEVOTHYROXINE SODIUM 75 MCG PO TABS
150.0000 ug | ORAL_TABLET | Freq: Every day | ORAL | Status: DC
  Administered 2022-04-02 – 2022-04-03 (×2): 150 ug via ORAL
  Filled 2022-04-01 (×2): qty 2

## 2022-04-01 MED ORDER — DICLOFENAC SODIUM 1 % EX GEL: 2.0000 g | Freq: Three times a day (TID) | CUTANEOUS | Status: DC | PRN

## 2022-04-01 MED ORDER — BOOST / RESOURCE BREEZE PO LIQD CUSTOM
1.0000 | Freq: Three times a day (TID) | ORAL | Status: DC
  Administered 2022-04-01 – 2022-04-03 (×4): 1 via ORAL

## 2022-04-01 MED ORDER — BARRIER CREAM NON-SPECIFIED
1.0000 | TOPICAL_CREAM | Freq: Two times a day (BID) | TOPICAL | Status: DC
Start: 2022-04-01 — End: 2022-04-04
  Administered 2022-04-01 – 2022-04-03 (×5): 1 via TOPICAL
  Filled 2022-04-01: qty 1

## 2022-04-01 MED ORDER — ROSUVASTATIN CALCIUM 20 MG PO TABS
20.0000 mg | ORAL_TABLET | Freq: Every day | ORAL | Status: DC
  Administered 2022-04-02 – 2022-04-03 (×2): 20 mg via ORAL
  Filled 2022-04-01 (×2): qty 1

## 2022-04-01 MED ORDER — ASPIRIN 81 MG PO CHEW
81.0000 mg | CHEWABLE_TABLET | Freq: Every day | ORAL | Status: DC
  Administered 2022-04-02 – 2022-04-03 (×2): 81 mg via ORAL
  Filled 2022-04-01 (×2): qty 1

## 2022-04-01 MED ORDER — ADULT MULTIVITAMIN W/MINERALS CH
1.0000 | ORAL_TABLET | Freq: Every day | ORAL | Status: DC
  Administered 2022-04-02 – 2022-04-03 (×2): 1 via ORAL
  Filled 2022-04-01 (×2): qty 1

## 2022-04-01 MED ORDER — ACETAMINOPHEN 325 MG PO TABS
650.0000 mg | ORAL_TABLET | Freq: Four times a day (QID) | ORAL | Status: DC | PRN
Start: 2022-04-01 — End: 2022-04-04
  Administered 2022-04-02 (×2): 650 mg via ORAL
  Filled 2022-04-01 (×2): qty 2

## 2022-04-01 NOTE — Progress Notes (Signed)
OT Cancellation Note  Patient Details Name: [name] MRN: [id number] DOB: [birth year]   Cancelled Treatment:    Reason Eval/Treat Not Completed: Patient not medically ready  Jeri Modena 04/01/2022, 2:45 PM

## 2022-04-01 NOTE — Progress Notes (Signed)
Daily Progress Note   Patient Name: [name]       Date: 04/01/2022 DOB: November 10, 1945  Age: 76 y.o. MRN#: [id number] Attending Physician: Kari Pou, MD Primary Care Physician: Kari Adie, MD Admit Date: 03/22/2022  Reason for Consultation/Follow-up: Establishing goals of care  Patient Profile/HPI:  76 y/o female with multiple medical problems including underlying ILD was admitted on 8/4 for chest pain after a fall.  On 8/8 while in the hospital was noted to have profound hypoxemic respiratory failure felt to be related to aspiration pneumonia, required intubation and mechanical ventilation.  Palliative care was asked to get involved to discuss goals of care.   Subjective: Chart reviewed including labs progress notes and imaging.  Noted events of yesterday-patient with coffee-ground vomiting, GI consult pending.  Core track has been removed. Evaluated patient.  She was sitting up awake and alert.  She shares that the doctor had come in and told her she could have some Sprite she is very much looking forward to that. I called her grandson [name] for follow-up.   Reviewed medical updates.  [name] requested phone call from attending team with updates. [name] shares that he spoke with family regarding his role as Marine scientist for patient.  They are supportive of him and this role.  He asked if DO NOT RESUSCITATE order had been implemented I told him it was yesterday per his discussion with palliative team member [name].  He is in agreement with this. He notes that CODE STATUS may be revisited in the future if she heals.  I encouraged him if there comes a time where they consider reversing her CODE STATUS to please talk with her provider and consider that even if her current  acute issue heals she will continue to have her overall existing chronic illnesses and if she were to stop breathing and her heart stop and I then it would more likely be the result of deterioration of her chronic illnesses rather than an acute reversible event and would not restore her to better quality of life than she currently has. [name] acknowledged this likelihood.      Physical Exam Vitals and nursing note reviewed.  HENT:     Mouth/Throat:     Mouth: Mucous membranes are dry.  Neurological:     Mental Status: She  is alert.             Vital Signs: BP (!) 167/79   Pulse (!) 102   Temp 98.5 F (36.9 C) (Oral)   Resp 19   Ht '5\' 2"'$  (1.575 m)   Wt 40 kg   SpO2 100%   BMI 16.13 kg/m  SpO2: SpO2: 100 % O2 Device: O2 Device: Nasal Cannula O2 Flow Rate: O2 Flow Rate (L/min): 1 L/min  Intake/output summary:  Intake/Output Summary (Last 24 hours) at 04/01/2022 1311 Last data filed at 03/31/2022 1600 Gross per 24 hour  Intake 3112.5 ml  Output 700 ml  Net 2412.5 ml   LBM: Last BM Date : 03/31/22 Baseline Weight: Weight: 40 kg Most recent weight: Weight: 40 kg       Palliative Assessment/Data: PPS: 30%      Patient Active Problem List   Diagnosis Date Noted   Protein-calorie malnutrition, severe 03/28/2022   Acute respiratory failure with hypoxia (HCC)    Multiple rib fractures 03/22/2022   AKI (acute kidney injury) ([place])    Failure to attend appointment with reason given 11/09/2021   Exudative age-related macular degeneration of left eye with active choroidal neovascularization ([place]) 05/01/2021   Deep retinal hemorrhage, left 05/01/2021   High risk social situation 04/25/2021   CAD (coronary artery disease) 04/25/2021   Aortic atherosclerosis ([place]) 04/25/2021   Vertebral fracture, osteoporotic ([place]) 01/22/2021   ILD (interstitial lung disease) ([place]) 11/28/2020   Controlled type 2 diabetes mellitus with stable proliferative retinopathy of both eyes, with  long-term current use of insulin ([place]) 08/07/2020   Pseudophakia of both eyes 08/07/2020   At high risk for pressure injury of skin 12/16/2019   Uncontrolled type 2 diabetes mellitus with hyperglycemia ([place]) 11/12/2019   S/P AKA (above knee amputation) bilateral ([place]) 11/12/2019   Food insecurity 11/12/2019   Wound infection    Moderate protein-calorie malnutrition (HCC)    Left breast mass 06/02/2019   Stress 06/02/2019   Peripheral vascular disease ([place]) 07/22/2018   Dyspnea 03/23/2018   Impaired functional mobility, balance, gait, and endurance 03/02/2018   Abnormality of gait 02/26/2018   Iron deficiency anemia 03/19/2017   Vision disturbance 03/20/2015   Memory difficulty 03/20/2015   At high risk for falls 11/10/2012   DIABETIC  RETINOPATHY 03/17/2007   Type 2 diabetes mellitus with diabetic neuropathy, unspecified ([place]) 03/17/2007   Hypothyroidism 10/16/2006   Type 2 diabetes mellitus ([place]) 10/16/2006   OBESITY, NOS 10/16/2006   HYPERTENSION, BENIGN SYSTEMIC 10/16/2006   GASTROESOPHAGEAL REFLUX, NO ESOPHAGITIS 10/16/2006   Rheumatoid arthritis ([place]) 10/16/2006   Disorder of bone and cartilage 10/16/2006   INCONTINENCE, URGE 10/16/2006    Palliative Care Assessment & Plan    Assessment/Recommendations/Plan  Continue current plan of care Continue DNR Notorization of HCPOA document pending- patient has verbalized her preference for her [name] to be her first [place] Notified attending team of [name]'s request for update   Code Status: DNR  Prognosis:  Unable to determine  Discharge Planning: To Be Determined  Care plan was discussed with patient's grandson, [name]  Thank you for allowing the Palliative Medicine Team to assist in the care of this patient.  Greater than 50%  of this time was spent counseling and coordinating care related to the above assessment and plan.  [name], AGNP-C Palliative Medicine   Please contact Palliative  Medicine Team phone at [phone] for questions and concerns.

## 2022-04-01 NOTE — Progress Notes (Signed)
Nutrition Follow-up  DOCUMENTATION CODES:   Severe malnutrition in context of chronic illness  INTERVENTION:  Boost Breeze po TID, each supplement provides 250 kcal and 9 grams of protein 30 ml ProSource Plus TID, each supplement provides 100 kcals and 15 grams protein.  MVI with minerals daily Request updated weight  NUTRITION DIAGNOSIS:   Severe Malnutrition related to chronic illness as evidenced by severe muscle depletion, severe fat depletion.  Ongoing  GOAL:   Patient will meet greater than or equal to 90% of their needs  Goal not met-Cortrak removed- addressing via nutrition supplements and CLD  MONITOR:   TF tolerance, Labs  REASON FOR ASSESSMENT:   Consult Enteral/tube feeding initiation and management  ASSESSMENT:   Pt with PMH of RA, ILD, DM, s/p bilateral AKA, HLD, and malnutrition admitted 8/4 after a fall with rib fxs.  8/8 possible aspiration, intubated 8/9 s/p cortrak placement; tip gastric per xray  8/10 extubated 8/13 cortrak removed d/t coffee ground emesis 8/14 diet adv to clear liquid   Per MD note today, pt noted to have had 1 episode of coffee ground emesis on 8/13 question r/t anemia from long slow GIB vs use of prednisone in ICU for sepsis leading to cushing ulcer. Holding off on GI consult at this time as they were concerned to perform EGD d/t confusion and GOC.   Pending SLP evaluation for diet recommendations.   Pt resting at time of visit. She awoke to shoulder tap, asked for mellow yellow and went back to sleep.  No recent updated weight documentation.  Admit wt: 40 kg Requested updated weight to review at follow up.    Medications: SSI 0-units q4h, synthroid, protonix  Labs: BUN 51, Cr 1.24, GFR 45, CBG's 119, 104, 122, 143 x12 hours  I/O's: +2795m since admission  Diet Order:   Diet Order             Diet clear liquid Room service appropriate? Yes; Fluid consistency: Thin  Diet effective now                    EDUCATION NEEDS:   No education needs have been identified at this time  Skin:  Skin Assessment: Skin Integrity Issues: Skin Integrity Issues:: DTI DTI: coccyx, L buttock  Last BM:  8/13  Height:   Ht Readings from Last 1 Encounters:  03/22/22 _0  (1.575 m)    Weight:   Wt Readings from Last 1 Encounters:  03/22/22 40 kg   BMI:  Body mass index is 16.13 kg/m.  Estimated Nutritional Needs:   Kcal:  1200-1400  Protein:  60-70 grams  Fluid:  >1.5 L/day  AClayborne Dana RDN, LDN Clinical Nutrition

## 2022-04-01 NOTE — Progress Notes (Signed)
SLP Cancellation Note  Patient Details Name: [name] MRN: [id number] DOB: 01-05-1946   Cancelled treatment:       Reason Eval/Treat Not Completed: Medical issues which prohibited therapy. SLP currently following pt but did receive order to reassess. Since then, pt has had N/V ad coffee ground emesis, and has been made NPO awaiting GI consult. SLP will hold PO trials until medically cleared.     Osie Bond., M.A. [place] Office [phone]  Secure chat preferred  04/01/2022, 9:58 AM

## 2022-04-01 NOTE — Progress Notes (Signed)
   04/01/22 0811  Vitals  BP (!) 167/79  MAP (mmHg) 106  Pulse Rate (!) 102  MEWS COLOR  MEWS Score Color Green  Oxygen Therapy  SpO2 100 %  MEWS Score  MEWS Temp 0  MEWS Systolic 0  MEWS Pulse 1  MEWS RR 0  MEWS LOC 0  MEWS Score 1   8:03am: Paged primary MD regarding blood pressure, no response  8:20 am: Paged 2nd contact, no response   9:03 am: Paged after hours contact, spoke with MD regarding BP and requested clarification on strict NPO vs sips with meds. MD to contact the primary team for orders.

## 2022-04-01 NOTE — Progress Notes (Signed)
PT Cancellation Note  Patient Details Name: [name] MRN: [id number] DOB: 30-Sep-1945   Cancelled Treatment:    Reason Eval/Treat Not Completed: Other (comment)  Noted vomiting with coffee ground emesis;  Discussed pt with her bedside nurse, who told me they are working on getting her BP under control as well;   Repositioned pt into R semi-sidelying;   Will follow,   Roney Marion, [place] Office [phone]    Colletta Maryland 04/01/2022, 10:03 AM

## 2022-04-01 NOTE — Progress Notes (Addendum)
This chaplain responded PMT NP-Michelle consult for notarizing the Pt. HCPOA. The chaplain understands the NP-Michelle filled out the document and clarified the roles of the healthcare agents with the Pt. and family during the weekend family meeting.  The chaplain reviewed the Pt. chart and was updated by the Pt. RN-Caitlin and PT-Holly before the visit. The chaplain stepped into the room after the medical team. The chaplain listens as the Pt.shares her understanding of the previous meeting as "I may have to go somewhere else to live."  The Pt. follows up with gratitude for the presence of a loving God when she fell out of the wheelchair.    The Pt. identify's meeting with NP-Michelle and verbally confirms the Pt. grandson-Emmanuel and Pt. Brother-Ellis as the people she trusts to make medical decisions for her. The Pt. agrees for the notary and witnesses to visit today to notarized the Pt. HCPOA.  The Pt. revisits her favorite book of the Bible-Revelation with the chaplain. The Pt. is appreciative of the visit and later connects the chaplain's name Dorian Pod is Nelle (which the Pt. Is often called) backward.  **1300 This chaplain understands the Pt. is choosing to postpone the signing of her HCPOA until closer to d/c.  The Pt. shares she wants to be able to visually determine whether she knows the notary and witnesses and the Pt. has determined she is unable.  The chaplain will follow the Pt. chart to determine an appropriate time to re-visit.  The chaplain is available for F/U spiritual care as needed.  Chaplain Sallyanne Kuster [phone]

## 2022-04-01 NOTE — Progress Notes (Addendum)
Subjective:  Patient  continues to have decreased arousal, AMS and decreased attentiveness. She is reporting needles being placed into her eyes, having to pull her emesis bag out of her abdomen where it was wedged, and not having a bowel movement because her rectum was sown together.She reports some nausea and says it is better with mellow yellow which she continues to request. She reports some trouble with breathing, coughing.   Objective:  Vital signs in last 24 hours: Vitals:   03/31/22 1555 03/31/22 1956 04/01/22 0750 04/01/22 0811  BP: (!) 155/73 136/82 (!) 163/87 (!) 167/79  Pulse: 96 92 99 (!) 102  Resp: 19     Temp:   98.5 F (36.9 C)   TempSrc:   Oral   SpO2: 100% 99% 100% 100%  Weight:      Height:       Physical Exam: Constitutional:   General: Not in acute distress, appears chronically-ill, somnolent , reduced attention. Cardiovascular:  Tachycardic, regular rhythm, extra heart sound heard and cannot elucidate S3 vs S4 vs split S2. No murmurs, rubs, or gallops.  Pulmonary:  Pulmonary effort is normal on RA. Minimal crackles at the lung bases bilaterally. Abdominal:     General: Bowel sounds are normal. No distension or palpable masses. Diffuse ptp. Musculoskeletal:     Comments: Moderate tenderness to palpation of the left chest, displaced rib fractures noted over the left chest.Bilateral AKA. Skin: Warm and dry.   Assessment/Plan:  Principal Problem:   Multiple rib fractures Active Problems:   Hypothyroidism   HYPERTENSION, BENIGN SYSTEMIC   Rheumatoid arthritis (HCC)   S/P AKA (above knee amputation) bilateral (HCC)   Controlled type 2 diabetes mellitus with stable proliferative retinopathy of both eyes, with long-term current use of insulin (HCC)   ILD (interstitial lung disease) ([place])   AKI (acute kidney injury) ([place])   Acute respiratory failure with hypoxia ([place])   Protein-calorie malnutrition, severe  [name] is a 76 y/o female with past  medical history of RA , ILD , bilateral AKA secondary to diabetic ulcer (2021), T2DM, HTN, and wheelchair dependence that was admitted for pain control of multiple left-sided rib fractures after a fall from her wheelchair with aspiration and development of MRSA PNA requiring brief intubation. Acute hypoxic/hypercarbic respiratory failure in the setting of MRSA pneumonia  splinting from rib fractures History of ILD Patient had an aspiration event following hospitalization for rib fracture and was found to have MRSA PNA. She required intubation in the ICU. She is now completing her course of antibiotics and is afebrile with stable vitals wnl and saturating at 99% on RA. In addition to her clinical improvement her WBC is downtrending 16.3 to 13.4.  - Linezolid (day 4/5) - Encourage incentive spirometer use - Monitor CBC and temperature - Continue GOC discussions with grandson Jimmie Molly and brother Lissa Merlin this week  Coffee ground emesis Normocytic anemia Patient has chronic normocytic anemia likely anemia of chronic disease in the setting of RA. Received 1 unit pRBC 8/8. Hgb 12.3 to 9.9 today. Patient had 1 recorded episode of coffee ground emesis 8/13 which may suggest some contribution to anemia from long slow GI bleed. She was also recently in the ICU for sepsis and given prednisone, so this could also be coffee ground emesis from cushing ulcer. There were no further notes reporting repeat episodes but upon seeing the patient today there was evidence of coffee ground emesis in her emesis bag. It is unclear if this was new or  old. GI was hesitant to perform EGD per the weekend team given confusion around goals of care. Will forgo consult at this time and continue to monitor. Seen by SP for swallowing eval today but did not have eval given need for medical clearance. -Zofran '4mg'$  prn q6 hrs -Clear liquid diet -F/u speech eval - Monitor CBC    Multiple left rib fracturesWheelchair dependent s/p bilateral  AKA Continues to have left-sided rib pain. - Tylenol q8 and q6 PRN - Tramadol q12  - Lidocaine patch q12H  -Diclofenac gel  Protein calorie malnutrition Currently on clear liquid diet. Was unable to get speech path eval for swallowing today as they did not think she had the medical clearance to do so. No longer has cortrak in place and will continue to advance diet as tolerated. - clear liquid diet - Will have speech pathology reassess     Type 2 DM A1c 6.2 on admission. CBG of 143 this morning.  - Continue sliding scale  - Monitor CBG  Hypertension BP well controlled last night at 136/82 but elevated today with systolics into the 878'M. - Continue home amlodipine 10 mg - Monitor BP   AKI -Resolved Uncertain baseline creatinine. Creatinine 1.55 on admission.  Creatinine continues to improve.  Creatinine 1.1 today.  BUN also improving.  BUN 49 today. Hyperkalemia has resolved after receiving lokelma. Potassium 4.9 today - Monitor BMP   Diarrhea-Resolved Flexiseal placed 8/11 and no longer in place.  No recorded bowel movement over the last 3 days. - Continue monitoring and reassess bowel regimen as necessary  Chronic: Rheumatoid arthritis -No current treatment   Hyperlipidemia - Continue rosuvastatin 20 mg  Hypothyroidism - Continue levothyroxine 150 mcg   Prior to Admission Living Arrangement: Home with grandsons Anticipated Discharge Location: Pending goals of care discussion with family Barriers to Discharge: continued management Dispo: Anticipated discharge in approximately greater than 2 day(s).   Iona Coach, MD 04/01/2022, 1:05 PM Pager: [phone] After 5pm on weekdays and 1pm on weekends: On Call pager [phone]

## 2022-04-02 DIAGNOSIS — K921 Melena: Secondary | ICD-10-CM

## 2022-04-02 DIAGNOSIS — J849 Interstitial pulmonary disease, unspecified: Secondary | ICD-10-CM | POA: Diagnosis not present

## 2022-04-02 DIAGNOSIS — R1110 Vomiting, unspecified: Secondary | ICD-10-CM

## 2022-04-02 DIAGNOSIS — J15212 Pneumonia due to Methicillin resistant Staphylococcus aureus: Secondary | ICD-10-CM

## 2022-04-02 DIAGNOSIS — D500 Iron deficiency anemia secondary to blood loss (chronic): Secondary | ICD-10-CM

## 2022-04-02 DIAGNOSIS — I10 Essential (primary) hypertension: Secondary | ICD-10-CM

## 2022-04-02 DIAGNOSIS — E440 Moderate protein-calorie malnutrition: Secondary | ICD-10-CM

## 2022-04-02 DIAGNOSIS — S2242XA Multiple fractures of ribs, left side, initial encounter for closed fracture: Secondary | ICD-10-CM | POA: Diagnosis not present

## 2022-04-02 DIAGNOSIS — Z515 Encounter for palliative care: Secondary | ICD-10-CM | POA: Diagnosis not present

## 2022-04-02 DIAGNOSIS — E785 Hyperlipidemia, unspecified: Secondary | ICD-10-CM

## 2022-04-02 DIAGNOSIS — N179 Acute kidney failure, unspecified: Secondary | ICD-10-CM | POA: Diagnosis not present

## 2022-04-02 LAB — BASIC METABOLIC PANEL
Anion gap: 6 (ref 5–15)
BUN: 40 mg/dL — ABNORMAL HIGH (ref 8–23)
CO2: 23 mmol/L (ref 22–32)
Calcium: 8.6 mg/dL — ABNORMAL LOW (ref 8.9–10.3)
Chloride: 112 mmol/L — ABNORMAL HIGH (ref 98–111)
Creatinine, Ser: 1.12 mg/dL — ABNORMAL HIGH (ref 0.44–1.00)
GFR, Estimated: 51 mL/min — ABNORMAL LOW (ref >60)
Glucose, Bld: 100 mg/dL — ABNORMAL HIGH (ref 70–99)
Potassium: 4.6 mmol/L (ref 3.5–5.1)
Sodium: 141 mmol/L (ref 135–145)

## 2022-04-02 LAB — CBC
HCT: 29.7 % — ABNORMAL LOW (ref 36.0–46.0)
Hemoglobin: 9 g/dL — ABNORMAL LOW (ref 12.0–15.0)
MCH: 28.1 pg (ref 26.0–34.0)
MCHC: 30.3 g/dL (ref 30.0–36.0)
MCV: 92.8 fL (ref 80.0–100.0)
Platelets: 290 10*3/uL (ref 150–400)
RBC: 3.2 MIL/uL — ABNORMAL LOW (ref 3.87–5.11)
RDW: 13.7 % (ref 11.5–15.5)
WBC: 12.1 10*3/uL — ABNORMAL HIGH (ref 4.0–10.5)
nRBC: 0 % (ref 0.0–0.2)

## 2022-04-02 LAB — GLUCOSE, CAPILLARY
Glucose-Capillary: 100 mg/dL — ABNORMAL HIGH (ref 70–99)
Glucose-Capillary: 111 mg/dL — ABNORMAL HIGH (ref 70–99)
Glucose-Capillary: 114 mg/dL — ABNORMAL HIGH (ref 70–99)
Glucose-Capillary: 117 mg/dL — ABNORMAL HIGH (ref 70–99)
Glucose-Capillary: 239 mg/dL — ABNORMAL HIGH (ref 70–99)
Glucose-Capillary: 85 mg/dL (ref 70–99)
Glucose-Capillary: 97 mg/dL (ref 70–99)

## 2022-04-02 NOTE — Progress Notes (Signed)
Physical Therapy Treatment Patient Details Name: [name] MRN: [id number] DOB: 1946/04/28 Today's Date: 04/02/2022   History of Present Illness Patient is a 76 y/o female who presents 03/22/22 with left rib fxs 5-8 s/p fall from her w/c. Intubated 8/8-8/10. PMH includes bil AKAs, schizotypal personality, HTN, DM, retinal detachment.    PT Comments    Session limited as pt unable to tolerate significant time edge of bed due to pain (reports L ribs, bottom, back). Repositioning in upright positioning for self feeding. Currently requiring two person max-total assist for functional mobility. Continue to recommend SNF for ongoing Physical Therapy.     Recommendations for follow up therapy are one component of a multi-disciplinary discharge planning process, led by the attending physician.  Recommendations may be updated based on patient status, additional functional criteria and insurance authorization.  Follow Up Recommendations  Skilled nursing-short term rehab (<3 hours/day) Can patient physically be transported by private vehicle: No   Assistance Recommended at Discharge Frequent or constant Supervision/Assistance  Patient can return home with the following Two people to help with walking and/or transfers;Direct supervision/assist for financial management;A lot of help with bathing/dressing/bathroom;Assistance with cooking/housework;Direct supervision/assist for medications management;Assist for transportation   Equipment Recommendations  None recommended by PT    Recommendations for Other Services       Precautions / Restrictions Precautions Precautions: Fall;Other (comment) Precaution Comments: bil AKAs Restrictions Weight Bearing Restrictions: No     Mobility  Bed Mobility Overal bed mobility: Needs Assistance Bed Mobility: Supine to Sit, Sit to Supine, Rolling Rolling: Max assist   Supine to sit: +2 for physical assistance, Total assist, +2 for safety/equipment Sit to  supine: Total assist, +2 for physical assistance, +2 for safety/equipment   General bed mobility comments: pt reports pain at buttock with all upright posture. pt requires (A) to eob with pad and total support for trunk wtih posterior pelvic tilt. pt requires helicopter total +2 total to return supine. pt with a towel roll used at thighs as a wedge for upright sitting to eat. pt reports pain at buttock. pt rolled to R side and reports decreased pain and positioned with pillows    Transfers                   General transfer comment: pt states "i dont want to get up"    Ambulation/Gait                   Stairs             Wheelchair Mobility    Modified Rankin (Stroke Patients Only)       Balance Overall balance assessment: Needs assistance Sitting-balance support: Bilateral upper extremity supported Sitting balance-Leahy Scale: Zero                                      Cognition Arousal/Alertness: Awake/alert Behavior During Therapy: Flat affect Overall Cognitive Status: No family/caregiver present to determine baseline cognitive functioning                                 General Comments: Pt tearful and reports "i can make my own decision" in response to education regarding supervision required for eating. pt educated multiple times that food has to be with a staff member present and offering all the food the patient  wanted. Pt requires encouragement to attempt bed mobility        Exercises Other Exercises Other Exercises: declined to participate in cervical rotation attempts    General Comments General comments (skin integrity, edema, etc.): RA      Pertinent Vitals/Pain Pain Assessment Pain Assessment: Faces Faces Pain Scale: Hurts whole lot Pain Location: left ribs, back, bottom Pain Descriptors / Indicators: Discomfort, Grimacing, Guarding Pain Intervention(s): Limited activity within patient's tolerance,  Monitored during session, Repositioned    Home Living Family/patient expects to be discharged to:: Skilled nursing facility                   Additional Comments: patient previously seen by [facility] with home modifications for wheelchair level indep.    Prior Function            PT Goals (current goals can now be found in the care plan section) Acute Rehab PT Goals Patient Stated Goal: to eat Potential to Achieve Goals: Fair Progress towards PT goals: Not progressing toward goals - comment    Frequency    Min 2X/week      PT Plan Current plan remains appropriate    Co-evaluation PT/OT/SLP Co-Evaluation/Treatment: Yes Reason for Co-Treatment: For patient/therapist safety;To address functional/ADL transfers PT goals addressed during session: Mobility/safety with mobility OT goals addressed during session: ADL's and self-care;Proper use of Adaptive equipment and DME;Strengthening/ROM      AM-PAC PT "6 Clicks" Mobility   Outcome Measure  Help needed turning from your back to your side while in a flat bed without using bedrails?: A Lot Help needed moving from lying on your back to sitting on the side of a flat bed without using bedrails?: Total Help needed moving to and from a bed to a chair (including a wheelchair)?: Total Help needed standing up from a chair using your arms (e.g., wheelchair or bedside chair)?: Total Help needed to walk in hospital room?: Total Help needed climbing 3-5 steps with a railing? : Total 6 Click Score: 7    End of Session   Activity Tolerance: Patient limited by pain Patient left: in bed;with call bell/phone within reach;with bed alarm set Nurse Communication: Mobility status PT Visit Diagnosis: Pain;Muscle weakness (generalized) (M62.81) Pain - Right/Left: Left Pain - part of body: Leg;Arm     Time: 8527-7824 PT Time Calculation (min) (ACUTE ONLY): 32 min  Charges:  $Therapeutic Activity: 8-22 mins                      Wyona Almas, PT, DPT Acute Rehabilitation Services Office [phone]    Deno Etienne 04/02/2022, 3:42 PM

## 2022-04-02 NOTE — Evaluation (Signed)
Occupational Therapy Evaluation Patient Details Name: [name] MRN: [id number] DOB: July 26, 1946 Today's Date: 04/02/2022   History of Present Illness Patient is a 76 y/o female who presents 03/22/22 with left rib fxs 5-8 s/p fall from her w/c. Intubated 8/8-8/10. PMH includes bil AKAs, schizotypal personality, HTN, DM, retinal detachment.   Clinical Impression   PT admitted with L rib fx s/p fall. Pt currently with functional limitiations due to the deficits listed below (see OT problem list). Pt reports needing some numbers to get her w/c fixed and the air in her house. Pt reports severe L rib pain. Pt with patch on L ribs and reports the pain around her waist is too tight. Pt at times breaking with increased RR but then normal with oral intake. Pt reports "I have to do that because of my ribs. That's what I do". Pt self feeding with all liquids and straws placed in the lids of each container. Pt tearful at one point in session saying "I am in my right mind. I can make decision" due to supervision required to eat.  Pt will benefit from skilled OT to increase their independence and safety with adls and balance to allow discharge SNF.       Recommendations for follow up therapy are one component of a multi-disciplinary discharge planning process, led by the attending physician.  Recommendations may be updated based on patient status, additional functional criteria and insurance authorization.   Follow Up Recommendations  Skilled nursing-short term rehab (<3 hours/day)    Assistance Recommended at Discharge Intermittent Supervision/Assistance  Patient can return home with the following A little help with bathing/dressing/bathroom    Functional Status Assessment  Patient has had a recent decline in their functional status and demonstrates the ability to make significant improvements in function in a reasonable and predictable amount of time.  Equipment Recommendations  Wheelchair  (measurements OT);Wheelchair cushion (measurements OT);Hospital bed    Recommendations for Other Services       Precautions / Restrictions Precautions Precautions: Fall;Other (comment) Precaution Comments: bil AKAs; watch vitals      Mobility Bed Mobility Overal bed mobility: Needs Assistance Bed Mobility: Supine to Sit, Sit to Supine, Rolling Rolling: Max assist   Supine to sit: +2 for physical assistance, Total assist, +2 for safety/equipment Sit to supine: Total assist, +2 for physical assistance, +2 for safety/equipment   General bed mobility comments: pt reports pain at buttock with all upright posture. pt requires (A) to eob with pad and total support for trunk wtih posterior pelvic tilt. pt requires helicopter total +2 total to return supine. pt with a towel roll used at thighs as a wedge for upright sitting to eat. pt reports pain at buttock. pt rolled to R side and reports decreased pain and positioned with pillows    Transfers                   General transfer comment: pt states "i dont want to get up"      Balance Overall balance assessment: Needs assistance Sitting-balance support: Bilateral upper extremity supported Sitting balance-Leahy Scale: Zero                                     ADL either performed or assessed with clinical judgement   ADL Overall ADL's : Needs assistance/impaired Eating/Feeding: Minimal assistance Eating/Feeding Details (indicate cue type and reason): drinking broth with straw  with bil UE cupping the container and a lid. pt doing the same for drink and cup of water. Pt with a thumb to lateral aspect of 2nd digit pincher grasp and unable to pad to pad. pt declines modificaitons for utensil size increase Grooming: Wash/dry face;Set up;Bed level Grooming Details (indicate cue type and reason): wiping nose. noted to have clear drainage with meal time. SLP notified of occurrence                                General ADL Comments: progressed to eob sitting and unable to sustain     Vision Baseline Vision/History: 1 Wears glasses       Perception     Praxis      Pertinent Vitals/Pain Pain Assessment Pain Assessment: Faces Pain Score: 10-Worst pain ever Pain Location: "oh my left side" Pain Descriptors / Indicators: Discomfort, Grimacing, Guarding Pain Intervention(s): Monitored during session, Premedicated before session, Repositioned, Limited activity within patient's tolerance     Hand Dominance Right   Extremity/Trunk Assessment Upper Extremity Assessment Upper Extremity Assessment: RUE deficits/detail;LUE deficits/detail RUE Deficits / Details: AROM WFL for shoulders elbow with RA of hands with hand deviation LUE Deficits / Details: AROM WFL for shoulders elbow with RA of hands with hand deviation   Lower Extremity Assessment Lower Extremity Assessment: Generalized weakness;RLE deficits/detail;LLE deficits/detail RLE Deficits / Details: AKA LLE Deficits / Details: AKA   Cervical / Trunk Assessment Cervical / Trunk Assessment: Other exceptions;Kyphotic Cervical / Trunk Exceptions: R rotation of cervical (neck) positioning   Communication Communication Communication: No difficulties   Cognition Arousal/Alertness: Awake/alert Behavior During Therapy: Flat affect Overall Cognitive Status: No family/caregiver present to determine baseline cognitive functioning                                 General Comments: Pt tearful and reports "i can make my own decision" in response to education regarding supervision required for eating. pt educated multiple times that food has to be with a staff member present and offering all the food the patient wanted. Pt requires encouragement to attempt bed mobility     General Comments  RA    Exercises Other Exercises Other Exercises: declined to participate in cervical rotation attempts   Shoulder Instructions       Home Living Family/patient expects to be discharged to:: Skilled nursing facility                                 Additional Comments: patient previously seen by [facility] with home modifications for wheelchair level indep.      Prior Functioning/Environment Prior Level of Function : Patient poor historian/Family not available             Mobility Comments: Pt reports normally she uses her motorized w/c for all mobility, but the battery recently died (a day before her fall) so she had to use her manual w/c. She is unable to manuever her manual w/c due to RA in bil hands. [facility]. Performs scoot transfers independently ADLs Comments: independent per report        OT Problem List: Decreased activity tolerance;Impaired balance (sitting and/or standing);Decreased cognition;Pain;Decreased safety awareness      OT Treatment/Interventions: Self-care/ADL training;Therapeutic exercise;DME and/or AE instruction;Therapeutic activities;Cognitive remediation/compensation;Patient/family education;Balance training  OT Goals(Current goals can be found in the care plan section) Acute Rehab OT Goals Patient Stated Goal: to be able to feed myself OT Goal Formulation: Patient unable to participate in goal setting Time For Goal Achievement: 04/16/22 Potential to Achieve Goals: Good  OT Frequency: Min 2X/week    Co-evaluation PT/OT/SLP Co-Evaluation/Treatment: Yes Reason for Co-Treatment: Complexity of the patient's impairments (multi-system involvement);Necessary to address cognition/behavior during functional activity;For patient/therapist safety;To address functional/ADL transfers   OT goals addressed during session: ADL's and self-care;Proper use of Adaptive equipment and DME;Strengthening/ROM      AM-PAC OT "6 Clicks" Daily Activity     Outcome Measure Help from another person eating meals?: A Little Help from another person taking care of personal  grooming?: A Little Help from another person toileting, which includes using toliet, bedpan, or urinal?: A Lot Help from another person bathing (including washing, rinsing, drying)?: A Lot Help from another person to put on and taking off regular upper body clothing?: A Little Help from another person to put on and taking off regular lower body clothing?: A Lot 6 Click Score: 15   End of Session Nurse Communication: Mobility status;Weight bearing status;Precautions;Need for lift equipment  Activity Tolerance: Patient limited by pain Patient left: in bed;with call bell/phone within reach;with bed alarm set  OT Visit Diagnosis: Unsteadiness on feet (R26.81);Muscle weakness (generalized) (M62.81)                Time: 7371-0626 OT Time Calculation (min): 30 min Charges:  OT General Charges $OT Visit: 1 Visit OT Evaluation $OT Eval Moderate Complexity: 1 Mod   Brynn, OTR/L  Acute Rehabilitation Services Office: [phone] .   Jeri Modena 04/02/2022, 1:55 PM

## 2022-04-02 NOTE — Progress Notes (Signed)
Daily Progress Note   Patient Name: [name]       Date: 04/02/2022 DOB: [birth year]  Age: 76 y.o. MRN#: [id number] Attending Physician: Angelica Pou, MD Primary Care Physician: Janifer Adie, MD Admit Date: 03/22/2022  Reason for Consultation/Follow-up: Establishing goals of care  Patient Profile/HPI:  76 y/o female with multiple medical problems including underlying ILD was admitted on 8/4 for chest pain after a fall.  On 8/8 while in the hospital was noted to have profound hypoxemic respiratory failure felt to be related to aspiration pneumonia, required intubation and mechanical ventilation.  Palliative care was asked to get involved to discuss goals of care.   Subjective: Chart reviewed including labs progress notes and imaging.   Evaluated [name], she was awake and alert.  She was complaining of pain in her buttocks she felt like there was something in her buttocks.  I helped reposition her PureWick and this provided her with more comfort. She appears to be stabilizing.  Her diet is being advanced to dysphagia 1.  PT/OT has recommended short-term rehab.  Physical Exam Vitals and nursing note reviewed.  HENT:     Mouth/Throat:     Mouth: Mucous membranes are dry.  Neurological:     Mental Status: She is alert.             Vital Signs: BP (!) 146/63 (BP Location: Left Arm)   Pulse (!) 102   Temp 98.2 F (36.8 C) (Oral)   Resp 17   Ht '5\' 2"'$  (1.575 m)   Wt 39.7 kg   SpO2 98%   BMI 16.01 kg/m  SpO2: SpO2: 98 % O2 Device: O2 Device: Room Air O2 Flow Rate: O2 Flow Rate (L/min): 1 L/min  Intake/output summary:  Intake/Output Summary (Last 24 hours) at 04/02/2022 1652 Last data filed at 04/02/2022 0413 Gross per 24 hour  Intake --  Output 1200 ml  Net -1200 ml     LBM: Last BM Date : 03/31/22 Baseline Weight: Weight: 40 kg Most recent weight: Weight: 39.7 kg       Palliative Assessment/Data: PPS: 30%      Patient Active Problem List   Diagnosis Date Noted   Protein-calorie malnutrition, severe 03/28/2022   Acute respiratory failure with hypoxia (HCC)    Multiple rib fractures 03/22/2022  AKI (acute kidney injury) ([place])    Failure to attend appointment with reason given 11/09/2021   Exudative age-related macular degeneration of left eye with active choroidal neovascularization ([place]) 05/01/2021   Deep retinal hemorrhage, left 05/01/2021   High risk social situation 04/25/2021   CAD (coronary artery disease) 04/25/2021   Aortic atherosclerosis ([place]) 04/25/2021   Vertebral fracture, osteoporotic ([place]) 01/22/2021   ILD (interstitial lung disease) ([place]) 11/28/2020   Controlled type 2 diabetes mellitus with stable proliferative retinopathy of both eyes, with long-term current use of insulin ([place]) 08/07/2020   Pseudophakia of both eyes 08/07/2020   At high risk for pressure injury of skin 12/16/2019   Uncontrolled type 2 diabetes mellitus with hyperglycemia ([place]) 11/12/2019   S/P AKA (above knee amputation) bilateral ([place]) 11/12/2019   Food insecurity 11/12/2019   Wound infection    Moderate protein-calorie malnutrition ([place])    Left breast mass 06/02/2019   Stress 06/02/2019   Peripheral vascular disease ([place]) 07/22/2018   Dyspnea 03/23/2018   Impaired functional mobility, balance, gait, and endurance 03/02/2018   Abnormality of gait 02/26/2018   Iron deficiency anemia 03/19/2017   Vision disturbance 03/20/2015   Memory difficulty 03/20/2015   At high risk for falls 11/10/2012   DIABETIC  RETINOPATHY 03/17/2007   Type 2 diabetes mellitus with diabetic neuropathy, unspecified ([place]) 03/17/2007   Hypothyroidism 10/16/2006   Type 2 diabetes mellitus ([place]) 10/16/2006   OBESITY, NOS 10/16/2006   HYPERTENSION, BENIGN SYSTEMIC  10/16/2006   GASTROESOPHAGEAL REFLUX, NO ESOPHAGITIS 10/16/2006   Rheumatoid arthritis ([place]) 10/16/2006   Disorder of bone and cartilage 10/16/2006   INCONTINENCE, URGE 10/16/2006    Palliative Care Assessment & Plan    Assessment/Recommendations/Plan  Continue current plan of care Continue DNR Goals of care continue to be life-prolonging care with limits at DNR Notarization of HCPOA document pending- patient has verbalized her preference for her [place] to be her first HCPOA    Code Status: DNR  Prognosis:  Unable to determine  Discharge Planning: To Be Determined  Care plan was discussed with patient's grandson, Jimmie Molly  Thank you for allowing the Palliative Medicine Team to assist in the care of this patient.  Greater than 50%  of this time was spent counseling and coordinating care related to the above assessment and plan.  Mariana Kaufman, AGNP-C Palliative Medicine   Please contact Palliative Medicine Team phone at [phone] for questions and concerns.

## 2022-04-02 NOTE — Progress Notes (Signed)
Speech Language Pathology Treatment: Dysphagia  Patient Details Name: [name] MRN: [id number] DOB: [birth year] Today's Date: 04/02/2022 Time: 6237-6283 SLP Time Calculation (min) (ACUTE ONLY): 15 min  Assessment / Plan / Recommendation Clinical Impression  Pt was seen for f/u after MD had resume PO diet on previous date, although at this time on clear liquids only. Pt was found lying in bed with blue emesis bag at her mouth, reporting that her stomach was hurting "a little bit" but denying any vomiting and requesting sips of the liquids on her breakfast tray. Pt was very resistant to repositioning efforts, reporting fear of pain in ribs if sat upright. SLP used reverse Trendelenburg and provided careful assistance to get her as upright as possible. Pt consumed several sips of thin liquids on her tray and only two bites of jello. She had no overt s/s of aspiration or dysphagia, but did not consume much and was wanting to lie down. Will leave on clear liquid diet, but if MD believes it is medically appropriate to advance, would advance back to Dys 1 (puree) diet and thin liquids. Continue to suspect that positioning and mentation could pose an increase in aspiration risk intermittently throughout the day, so recommend full supervision to try to mitigate this as much as possible. Will f/u to see if she can advance any further, although note that most recently she had requested not to be on more solid foods as she preferred not to be on dentures.   HPI HPI: Patient is a 76 y/o female who presents with left rib fxs 5-8 s/p fall from her w/c. CXR 8/6 with hazy opacities and WBC increased 8/7, concerning for early PNA. Pt was more altered on 8/8, with respiratory decline requiring intubation (extubated 8/10). Food was noted in pt's mouth with concern for aspiration. PMH includes ILD, RA, bil AKAs, schizotypal personality, HTN, DM, retinal detachment, thyroid disease.      SLP Plan  Continue with current  plan of care      Recommendations for follow up therapy are one component of a multi-disciplinary discharge planning process, led by the attending physician.  Recommendations may be updated based on patient status, additional functional criteria and insurance authorization.    Recommendations  Diet recommendations: Dysphagia 1 (puree);Thin liquid Liquids provided via: Straw Medication Administration: Crushed with puree Supervision: Staff to assist with self feeding;Full supervision/cueing for compensatory strategies Compensations: Minimize environmental distractions;Small sips/bites;Slow rate Postural Changes and/or Swallow Maneuvers: Seated upright 90 degrees;Upright 30-60 min after meal                Oral Care Recommendations: Oral care BID Follow Up Recommendations: No SLP follow up Assistance recommended at discharge: PRN SLP Visit Diagnosis: Dysphagia, unspecified (R13.10) Plan: Continue with current plan of care           Osie Bond., M.A. [place] Office [phone]  Secure chat preferred   04/02/2022, 9:07 AM

## 2022-04-02 NOTE — Progress Notes (Signed)
Subjective:  Patient is alert and oriented today and had significantly improved attention and mental status relative to past days. She endorses no nausea, increased thirst and hunger, feeling the need to have BM but unable to. She continues to have pain over her left ribs.   Objective:  Vital signs in last 24 hours: Vitals:   04/02/22 0357 04/02/22 0500 04/02/22 0800 04/02/22 1410  BP: (!) 144/83  (!) 140/82 (!) 146/63  Pulse: 86  87 (!) 102  Resp: '18  16 17  '$ Temp: 98 F (36.7 C)  98 F (36.7 C) 98.2 F (36.8 C)  TempSrc: Oral  Oral Oral  SpO2: 100%  100% 98%  Weight:  39.7 kg    Height:       Physical Exam: Constitutional:   General: Laying in bed on right side not in acute distress, appears chronically-ill, talkative Cardiovascular:  Tachycardic, regular rhythm, extra heart sound heard and cannot elucidate S3 vs S4 vs split S2. No murmurs, rubs, or gallops.  Pulmonary:  Pulmonary effort is normal on RA. End expiratory crackles at the L lower lung base. Good air movement. Unable to listen to right lung fields given position in bed. GI/GU:   Bowel sounds are normal. No distension or palpable masses. Melena in bed without overt bleeding or palpable masses/stool on rectal exam. Pure wic in place. Musculoskeletal:  Moderate tenderness to palpation of the left chest, displaced rib fractures noted over the left chest.Bilateral AKA. Skin: Warm and dry.   Assessment/Plan:  Principal Problem:   Multiple rib fractures Active Problems:   Hypothyroidism   HYPERTENSION, BENIGN SYSTEMIC   Rheumatoid arthritis (HCC)   S/P AKA (above knee amputation) bilateral (HCC)   Controlled type 2 diabetes mellitus with stable proliferative retinopathy of both eyes, with long-term current use of insulin (HCC)   ILD (interstitial lung disease) ([place])   AKI (acute kidney injury) ([place])   Acute respiratory failure with hypoxia ([place])   Protein-calorie malnutrition, severe  [name] is a 76 y/o  female with past medical history of RA , ILD , bilateral AKA secondary to diabetic ulcer (2021), T2DM, HTN, and wheelchair dependence that was admitted for pain control of multiple left-sided rib fractures after a fall from her wheelchair with aspiration and development of MRSA PNA , now improving s/p extubation and completion of antibiotics  Coffee ground emesis  Melena Normocytic anemia Patient has chronic normocytic anemia likely anemia of chronic disease in the setting of RA. Received 1 unit pRBC 8/8. Hgb 12.3 to 9.0 over the past few days but prior hemoglobins have been around 9. Patient had 1 recorded episode of coffee ground emesis 8/13 and an episode of melena noted on 8/15 without gross blood or palpable mass/stool on rectal exam. This likely represents cushing ulcer that developed in the ICU. Vitals remain stable without hypotension or tachycardia, will continue to monitor blood counts and will forgo EGD at this time given risk with RA and stable condition. -Zofran '4mg'$  prn q6 hrs - Monitor CBC  Acute hypoxic/hypercarbic respiratory failure in the setting of MRSA pneumonia  splinting from rib fractures History of ILD Patient had an aspiration event following hospitalization for rib fracture and was found to have MRSA PNA requiring intubation.She continues to remain afebrile with stable vitals and improving WBC 13.4 to 12.1 s/p 5 day course of IV linezolid. - Monitor CBC and temperature - Continue GOC discussions with grandson Jimmie Molly and brother Lissa Merlin this week  Protein calorie malnutrition Deconditioning Reevaluated  by speech today and diet advanced to dysphagia 1 diet. Will continue to plan for dispo with PT/OT recs. - Dysphagia 1 diet - SLP following    Multiple left rib fracturesWheelchair dependent s/p bilateral AKA Continues to have left-sided rib pain. - Tylenol q8 and q6 PRN - Tramadol q12  - Lidocaine patch q12H  -Diclofenac gel   Type 2 DM A1c 6.2 on admission. CBG  of 97 this morning.  - Continue sliding scale  - Monitor CBG  Hypertension BP persistently elevated with systolics in the 364W-803O. This is likely in the setting of hospitalization and will refrain from making changes at this time. - Continue home amlodipine 10 mg - Monitor BP   AKI -Resolved Uncertain baseline creatinine. Creatinine 1.55 on admission.  Creatinine continues to stable at  Creatinine 1.12 today.BUN also improving at 40 today. Hyperkalemia has resolved after receiving lokelma and potassium remains stable at 4.6. - Monitor BMP   Diarrhea-Resolved Flexiseal placed 8/11 and no longer in place.  No recorded bowel movement over the last 3 days. - Continue monitoring and reassess bowel regimen as necessary  Chronic: Rheumatoid arthritis -No current treatment   Hyperlipidemia - Continue rosuvastatin 20 mg  Hypothyroidism - Continue levothyroxine 150 mcg   Prior to Admission Living Arrangement: Home with grandsons Anticipated Discharge Location: Pending goals of care discussion with family Barriers to Discharge: continued management Dispo: Anticipated discharge in approximately greater than 2 day(s).   Iona Coach, MD 04/02/2022, 2:40 PM Pager: [phone] After 5pm on weekdays and 1pm on weekends: On Call pager [phone]

## 2022-04-03 DIAGNOSIS — R1312 Dysphagia, oropharyngeal phase: Secondary | ICD-10-CM

## 2022-04-03 DIAGNOSIS — K273 Acute peptic ulcer, site unspecified, without hemorrhage or perforation: Secondary | ICD-10-CM

## 2022-04-03 DIAGNOSIS — R6520 Severe sepsis without septic shock: Secondary | ICD-10-CM

## 2022-04-03 LAB — BASIC METABOLIC PANEL
Anion gap: 4 — ABNORMAL LOW (ref 5–15)
BUN: 37 mg/dL — ABNORMAL HIGH (ref 8–23)
CO2: 24 mmol/L (ref 22–32)
Calcium: 8.7 mg/dL — ABNORMAL LOW (ref 8.9–10.3)
Chloride: 115 mmol/L — ABNORMAL HIGH (ref 98–111)
Creatinine, Ser: 1.12 mg/dL — ABNORMAL HIGH (ref 0.44–1.00)
GFR, Estimated: 51 mL/min — ABNORMAL LOW (ref >60)
Glucose, Bld: 113 mg/dL — ABNORMAL HIGH (ref 70–99)
Potassium: 4.4 mmol/L (ref 3.5–5.1)
Sodium: 143 mmol/L (ref 135–145)

## 2022-04-03 LAB — CBC
HCT: 31.3 % — ABNORMAL LOW (ref 36.0–46.0)
Hemoglobin: 9.4 g/dL — ABNORMAL LOW (ref 12.0–15.0)
MCH: 28 pg (ref 26.0–34.0)
MCHC: 30 g/dL (ref 30.0–36.0)
MCV: 93.2 fL (ref 80.0–100.0)
Platelets: 316 10*3/uL (ref 150–400)
RBC: 3.36 MIL/uL — ABNORMAL LOW (ref 3.87–5.11)
RDW: 13.7 % (ref 11.5–15.5)
WBC: 9.5 10*3/uL (ref 4.0–10.5)
nRBC: 0 % (ref 0.0–0.2)

## 2022-04-03 LAB — GLUCOSE, CAPILLARY
Glucose-Capillary: 107 mg/dL — ABNORMAL HIGH (ref 70–99)
Glucose-Capillary: 127 mg/dL — ABNORMAL HIGH (ref 70–99)
Glucose-Capillary: 204 mg/dL — ABNORMAL HIGH (ref 70–99)
Glucose-Capillary: 159 mg/dL — ABNORMAL HIGH (ref 70–99)

## 2022-04-03 MED ORDER — LIDOCAINE 5 % EX PTCH: 1.0000 | MEDICATED_PATCH | CUTANEOUS | 0 refills | Status: DC

## 2022-04-03 MED ORDER — ALUM & MAG HYDROXIDE-SIMETH 200-200-20 MG/5ML PO SUSP
15.0000 mL | Freq: Once | ORAL | Status: AC
  Administered 2022-04-03: 15 mL via ORAL
  Filled 2022-04-03: qty 30

## 2022-04-03 MED ORDER — PANTOPRAZOLE SODIUM 40 MG PO TBEC: 80.0000 mg | DELAYED_RELEASE_TABLET | Freq: Every day | ORAL | 0 refills | Status: DC

## 2022-04-03 NOTE — Discharge Summary (Signed)
Name: [name] MRN: [id number] DOB: Oct 07, 1945 76 y.o. PCP: Janifer Adie, MD  Date of Admission: 03/22/2022  2:34 AM Date of Discharge: No discharge date for patient encounter. Attending Physician: Angelica Pou, MD  Discharge Diagnosis: 1. Principal Problem:   Multiple rib fractures Active Problems:   Hypothyroidism   HYPERTENSION, BENIGN SYSTEMIC   Rheumatoid arthritis (HCC)   S/P AKA (above knee amputation) bilateral (HCC)   Controlled type 2 diabetes mellitus with stable proliferative retinopathy of both eyes, with long-term current use of insulin (HCC)   ILD (interstitial lung disease) ([place])   AKI (acute kidney injury) ([place])   Acute respiratory failure with hypoxia (HCC)   Protein-calorie malnutrition, severe    Discharge Medications: Allergies as of 04/03/2022       Reactions   Feraheme [ferumoxytol] Itching   Rosiglitazone Maleate Other (See Comments)   REACTION: Difficulty walking, Fatigue, shortness of breath        Medication List     STOP taking these medications    aspirin EC 81 MG tablet   doxycycline 100 MG tablet Commonly known as: VIBRA-TABS   furosemide 40 MG tablet Commonly known as: LASIX   gabapentin 100 MG capsule Commonly known as: NEURONTIN   guaiFENesin-dextromethorphan 100-10 MG/5ML syrup Commonly known as: ROBITUSSIN DM   losartan 50 MG tablet Commonly known as: COZAAR   nystatin powder Commonly known as: MYCOSTATIN/NYSTOP   predniSONE 5 MG tablet Commonly known as: DELTASONE   Vitamin D3 50 MCG (2000 UT) Tabs       TAKE these medications    acetaminophen 325 MG tablet Commonly known as: TYLENOL Take 2 tablets (650 mg total) by mouth every 6 (six) hours.   amLODipine 10 MG tablet Commonly known as: NORVASC Take 1 tablet (10 mg total) by mouth at bedtime. What changed: when to take this   fluticasone 50 MCG/ACT nasal spray Commonly known as: FLONASE Place 1 spray into both nostrils daily.    Incontinence Brief Medium Misc Wear as needed for incontinence   levothyroxine 150 MCG tablet Commonly known as: SYNTHROID TAKE 1 TABLET BY MOUTH EVERY DAY What changed: Another medication with the same name was removed. Continue taking this medication, and follow the directions you see here.   lidocaine 5 % Commonly known as: LIDODERM Place 1 patch onto the skin daily. Remove & Discard patch within 12 hours or as directed by MD Start taking on: April 04, 2022 What changed:  how much to take how to take this when to take this additional instructions   Misc. Devices Misc Mepilex silver sulfate-foam bandage 6x6" change dressing weekly   ProAir HFA 108 (90 Base) MCG/ACT inhaler Generic drug: albuterol INHALE 2 PUFFS INTO THE LUNGS EVERY 6 HOURS AS NEEDED FOR WHEEZING OR SHORTNESS OF BREATH   rosuvastatin 20 MG tablet Commonly known as: CRESTOR Take 1 tablet (20 mg total) by mouth daily. What changed: when to take this        Disposition and follow-up:   [name] was discharged from [facility] in Stable condition.  At the hospital follow up visit please address:  1.  Patient has resolved sepsis from MRSA PNA following aspiration. -Follow up vitals, CBC to ensure resolution. Can repeat CXR.  Patient had coffee ground emesis and melena in the setting of suspected cushing ulcer from the ICU and sepsis. -Follow up CBC to monitor for down trending Hgb, also f/u repeat emesis or melena. Would expect melena  in the next few days though given her slow GI transit.  Pre-renal AKI in the setting of sepsis -Follow up BMP to ensure continued resolution of AKI  Left 5th-8th blunt trauma rib fracture after fall from wheelchair Bilateral AKA and wheelchair dependent Pain was controlled with lidocaine patch over the anterior left 5th-8th ribs with minimal tylenol use, would recommend continued use of patch and tylenol. You may consider prn oxycodone although she  did not use that here. Ensure that pain is controlled for appropriate breathing to prevent PNA or atelectasis. PT/OT will need to be involved as she had minimal activity in the hospital and will need help to prevent deconditioning.  Oropharyngeal Dysphagia -Patient is on a dysphagia 1 diet per SLP consisting of thin liquids through a straw with medications administered crushed in puree. She needs full supervision. RD also recommended supplemental nutrition to meet needs: Boost Breeze po TID, each supplement provides 250 kcal and 9 grams of protein,30 ml ProSource Plus TID, each supplement provides 100 kcals and 15 grams protein.   Discontinued ASA and Lasix No history of CAD/PAD/ stroke to my knowledge so no need for secondary prevention ASA and discontinued in the setting of recent GI bleed. Also no history of CHF to my knowledge and given recent AKI with euvolemic status discontinued Lasix. Re-evaluate need based on clinical indications.  2.  Labs / imaging needed at time of follow-up: NA  3.  Pending labs/ test needing follow-up: NA  Follow-up Appointments:   Hospital Course by problem list: 76 y/o female with multiple medical problems including underlying ILD was admitted on 8/4 for chest pain after a fall.  On 8/8 while in the hospital was noted to have profound hypoxemic respiratory failure felt to be related to aspiration pneumonia, required ICU transfer intubation and mechanical ventilation, extubated on 8/10.  Acute hypoxemic respiratory failure due to aspirationAspiration MRSA pneumia Underlying ILD Initially treated with flagyl x 1 dose, vanc and cefepimex 1 day, transitioned to unasyn x3 days, completed 5 day course IV linezolid on 04/02/22 per ID .Patient was weaned off of supplemental oxygen and saturated well on RA with stable vital signs at discharge and resolution of elevated WBC to 9.5 at discharge, indicating resolution of clinical pneumonia and hypoxic respiratory failure.    Multiple rib fractures Patient has nondisplaced fracture of left rib 5-7, and 7-8 per radiology reports but appears to have displacement on exam.  Fortunately there was no hematoma, pneumomediastinum, hemothorax or pneumothorax.  No pelvic fracture.  NSAIDS were avoided given AKI, pain was controlled with minimal tylenol and lidocaine patch placed over the left anterior 5th-8th ribs. Prn oxycodone and diclofenac gel were not used.  Dysphagia Malnutrition Placed on core trak and rectal tube following aspiration, seen by SLP  and advanced to dysphagia diet 1 at discharge with removal of tubes. RD evaluated the patient and recommended Boost Breeze po TID(each supplement provides 250 kcal and 9 grams of protein) and  30 ml ProSource Plus TID(each supplement provides 100 kcals and 15 grams protein) to meat nutritional goals. SLP discharge recommendations: Diet recommendations: Dysphagia 1 (puree);Thin liquid Liquids provided via: Straw Medication Administration: Crushed with puree Supervision: Staff to assist with self feeding;Full supervision/cueing for compensatory strategies Compensations: Minimize environmental distractions;Small sips/bites;Slow rate Postural Changes and/or Swallow Maneuvers: Seated upright 90 degrees;Upright 30-60 min after meal   Coffee ground emesis  Melena Acute on chronic normocytic anemia Patient has chronic normocytic anemia likely anemia of chronic disease in the setting of  RA. Received 1 unit pRBC 8/8.  Patient had 1 recorded episode of coffee ground emesis 8/13 and an episode of melena noted on 8/15 without gross blood or palpable mass/stool on rectal exam.This likely represents cushing ulcer that developed in the ICU and acute blood loss anemia as result per CBC. Patient did not undergo EDG evaluation given some uncertainty about code status by GI, and then the primary team felt this was clinically stable and monitored for resolution of emesis and melena at  discharge.Vitals remain stable without hypotension or tachycardia, Hgb downtrended to 9.0 while on the floor but uptrended to 9.4 at discharge. Patient was started on PPI during hospitalization and will continue this at discharge BID for 4 weeks, then once daily. Given slow GI transit and low stool output, may have residual melena over the next couple of days.  Pre-renal AKI Hyperkalemia Uncertain baseline creatinine. Creatinine 1.55 on admission with a peak of 1.86 in the setting of pre-renal AKI due to sepsis and poor oral intake.  Creatinine normalized to 1.12 at discharge with improvement of BUN to 37.Hyperkalemia in the setting of AKI has resolved after receiving lokelma and potassium remains stable at 4.4 at discharge.  Type 2 DM A1c 6.2 on admission. Blood glucose monitored and SSI used during admission.  Chronic: Rheumatoid arthritis -No current treatment (No steroids)  Hyperlipidemia - Continued rosuvastatin 20 mg  Hypothyroidism - Continued levothyroxine 150 mcg  Discharge Exam:   BP (!) 165/60 (BP Location: Left Arm)   Pulse 95   Temp 98 F (36.7 C) (Oral)   Resp 17   Ht '5\' 2"'$  (1.575 m)   Wt 38.4 kg   SpO2 96%   BMI 15.48 kg/m  Discharge exam:  Constitutional:   General: Laying in bed on right side not in acute distress, appears chronically-ill, talkative and pleasant Cardiovascular:  Regular rate and rhythm, extra heart sound heard and cannot elucidate S3 vs S4 vs split S2. No murmurs, rubs, or gallops.  Pulmonary:  Pulmonary effort is normal on RA. End expiratory crackles at the L lower lung base. Good air movement. Unable to listen to right lung fields given position in bed. GI/GU:   Bowel sounds are normal. No distension or palpable masses. No evidence of coffee ground emesis or melena. Pure wic in place. Musculoskeletal:  Moderate tenderness to palpation of the left chest, displaced rib fractures noted over the left chest.Bilateral AKA. Skin: Warm and dry  without any signs of erythema or pressure wound.  Pertinent Labs, Studies, and Procedures:     Latest Ref Rng & Units 04/03/2022    3:43 AM 04/02/2022    2:22 AM 04/01/2022    2:47 AM  CBC  WBC 4.0 - 10.5 K/uL 9.5  12.1  13.4   Hemoglobin 12.0 - 15.0 g/dL 9.4  9.0  9.8   Hematocrit 36.0 - 46.0 % 31.3  29.7  33.7   Platelets 150 - 400 K/uL 316  290  290       Latest Ref Rng & Units 04/03/2022    3:43 AM 04/02/2022    2:22 AM 04/01/2022    2:47 AM  BMP  Glucose 70 - 99 mg/dL 113  100  170   BUN 8 - 23 mg/dL 37  40  51   Creatinine 0.44 - 1.00 mg/dL 1.12  1.12  1.24   Sodium 135 - 145 mmol/L 143  141  141   Potassium 3.5 - 5.1 mmol/L 4.4  4.6  4.8  Chloride 98 - 111 mmol/L 115  112  111   CO2 22 - 32 mmol/L '24  23  23   '$ Calcium 8.9 - 10.3 mg/dL 8.7  8.6  8.7       Discharge Instructions: Discharge Instructions     Diet - low sodium heart healthy   Complete by: As directed    Increase activity slowly   Complete by: As directed    No wound care   Complete by: As directed        Signed: Iona Coach, MD 04/03/2022, 1:38 PM   Pager: [phone]

## 2022-04-03 NOTE — Progress Notes (Signed)
Called [facility] for transport to [facility]

## 2022-04-03 NOTE — Progress Notes (Signed)
Occupational Therapy Treatment Patient Details Name: [name] MRN: [id number] DOB: 1945/09/07 Today's Date: 04/03/2022   History of present illness Patient is a 76 y/o female who presents 03/22/22 with left rib fxs 5-8 s/p fall from her w/c. Intubated 8/8-8/10. PMH includes bil AKAs, schizotypal personality, HTN, DM, retinal detachment.   OT comments  Pt received requesting assistance off of bedpan. Pt able to roll with improved abilities (R>L) though Total A still required for LB ADLs. Pt with lunch tray delivered during session with pt able to demo ability to self feed with Setup assist. Emphasis on upright posture during functional tasks throughout session with fair carryover (tendency for significant cervical flexion). Continue to rec SNF rehab at DC.    Recommendations for follow up therapy are one component of a multi-disciplinary discharge planning process, led by the attending physician.  Recommendations may be updated based on patient status, additional functional criteria and insurance authorization.    Follow Up Recommendations  Skilled nursing-short term rehab (<3 hours/day)    Assistance Recommended at Discharge Intermittent Supervision/Assistance  Patient can return home with the following  A little help with bathing/dressing/bathroom   Equipment Recommendations  Wheelchair (measurements OT);Wheelchair cushion (measurements OT);Hospital bed    Recommendations for Other Services      Precautions / Restrictions Precautions Precautions: Fall;Other (comment) Precaution Comments: bil AKAs Restrictions Weight Bearing Restrictions: Yes Other Position/Activity Restrictions: NWB BLE; hx of B AKA       Mobility Bed Mobility Overal bed mobility: Needs Assistance Bed Mobility: Rolling Rolling: Mod assist         General bed mobility comments: good reaching to bedrails with assist to roll hips fully over. Able to roll to R side better than L side    Transfers                          Balance                                           ADL either performed or assessed with clinical judgement   ADL Overall ADL's : Needs assistance/impaired Eating/Feeding: Set up;Bed level Eating/Feeding Details (indicate cue type and reason): assist to open containers but able to reach to bring cups to mouth, hold regular utensils well Grooming: Set up;Bed level                       Toileting- Clothing Manipulation and Hygiene: Total assistance;Bed level Toileting - Clothing Manipulation Details (indicate cue type and reason): assist for peri care after bedpan use, BM       General ADL Comments: Focus on bed mobility, positioning and posture with ADLs, reaching to food    Extremity/Trunk Assessment Upper Extremity Assessment Upper Extremity Assessment: RUE deficits/detail;LUE deficits/detail RUE Deficits / Details: AROM WFL for shoulders elbow with RA of hands with hand deviation LUE Deficits / Details: AROM WFL for shoulders elbow with RA of hands with hand deviation   Lower Extremity Assessment Lower Extremity Assessment: Defer to PT evaluation        Vision   Vision Assessment?: No apparent visual deficits   Perception     Praxis      Cognition Arousal/Alertness: Awake/alert Behavior During Therapy: WFL for tasks assessed/performed Overall Cognitive Status: No family/caregiver present to determine baseline cognitive functioning  General Comments: Pleasant, follows directions well and shows some insight into deficits. mild confusion noted regarding situation/DC plan        Exercises      Shoulder Instructions       General Comments      Pertinent Vitals/ Pain       Pain Assessment Pain Assessment: Faces Faces Pain Scale: Hurts a little bit Pain Location: L ribs Pain Descriptors / Indicators: Grimacing, Discomfort Pain Intervention(s): Monitored during  session  Home Living                                          Prior Functioning/Environment              Frequency  Min 2X/week        Progress Toward Goals  OT Goals(current goals can now be found in the care plan section)  Progress towards OT goals: Progressing toward goals  Acute Rehab OT Goals Patient Stated Goal: eat some real food OT Goal Formulation: With patient Time For Goal Achievement: 04/16/22 Potential to Achieve Goals: Good ADL Goals Additional ADL Goal #1: Pt will complete basic transfer total +2 mod (A) Additional ADL Goal #2: Pt will tolerate upright posture at EOB for 5 minutes mod (A) Additional ADL Goal #3: pt will tolerate oob to chair on geomat for 30 minutes as precursor to wc level care  Plan Discharge plan remains appropriate    Co-evaluation                 AM-PAC OT "6 Clicks" Daily Activity     Outcome Measure   Help from another person eating meals?: A Little Help from another person taking care of personal grooming?: A Little Help from another person toileting, which includes using toliet, bedpan, or urinal?: Total Help from another person bathing (including washing, rinsing, drying)?: A Lot Help from another person to put on and taking off regular upper body clothing?: A Little Help from another person to put on and taking off regular lower body clothing?: A Lot 6 Click Score: 14    End of Session    OT Visit Diagnosis: Unsteadiness on feet (R26.81);Muscle weakness (generalized) (M62.81)   Activity Tolerance Patient tolerated treatment well   Patient Left in bed;with call bell/phone within reach;with bed alarm set   Nurse Communication Mobility status        Time: 4360-6770 OT Time Calculation (min): 35 min  Charges: OT General Charges $OT Visit: 1 Visit OT Treatments $Self Care/Home Management : 23-37 mins  Malachy Chamber, OTR/L Acute Rehab Services Office: [phone]   Layla Maw 04/03/2022, 2:35 PM

## 2022-04-03 NOTE — Progress Notes (Signed)
Offered to get patient up the the chair, but she declined at this time

## 2022-04-03 NOTE — Progress Notes (Signed)
Speech Language Pathology Treatment: Dysphagia  Patient Details Name: WINTA BARCELO MRN: [id number] DOB: [birth year] Today's Date: 04/03/2022 Time: 5643-3295 SLP Time Calculation (min) (ACUTE ONLY): 26 min  Assessment / Plan / Recommendation Clinical Impression  Per RN, pt advanced back to dys 1 diet/thin liquids and appearing to tolerate well. She, however, reports as that pt has taken in few POs with her, including crushed meds, due to nausea. Upon SLP arrival, pt with emesis bag directly under chin, stating that she was keeping it there in the event that she needs to vomit. She denied any current feeling of nausea/vomiting. With NT assist for improved upright positioning, pt consumed thin liquids, x3 bites puree, x1 bite simulated dys 2 solid and regular textures without clinical signs of aspiration. Prolonged mastication evident with regular textured solid and oral clearance was difficult, requiring multiple liquid washes and lingual sweeps. Pt appeared very fatigued during attempts to orally clear regular solid, taking intermittent breaks during task. Simulated dys 2 solid improved this presentation and suspect this would be a functional diet to advance to when pt is ready. At this time, pt expresses desire for soft foods (ie. chopped fruit) that she can chew with her dentures, however would prefer to remain on dys 1 diet for a few more days. Would agree that this diet would be safer at this time given her endurance and hx of fluctuating mentation. Will f/u to assess readiness for diet advancement.    HPI HPI: Patient is a 76 y/o female who presents with left rib fxs 5-8 s/p fall from her w/c. CXR 8/6 with hazy opacities and WBC increased 8/7, concerning for early PNA. Pt was more altered on 8/8, with respiratory decline requiring intubation (extubated 8/10). Food was noted in pt's mouth with concern for aspiration. PMH includes ILD, RA, bil AKAs, schizotypal personality, HTN, DM, retinal detachment,  thyroid disease.      SLP Plan  Continue with current plan of care      Recommendations for follow up therapy are one component of a multi-disciplinary discharge planning process, led by the attending physician.  Recommendations may be updated based on patient status, additional functional criteria and insurance authorization.    Recommendations  Diet recommendations: Dysphagia 1 (puree);Thin liquid Liquids provided via: Straw Medication Administration: Crushed with puree Supervision: Staff to assist with self feeding;Full supervision/cueing for compensatory strategies Compensations: Minimize environmental distractions;Small sips/bites;Slow rate Postural Changes and/or Swallow Maneuvers: Seated upright 90 degrees;Upright 30-60 min after meal                Oral Care Recommendations: Oral care BID Follow Up Recommendations: No SLP follow up Assistance recommended at discharge: PRN SLP Visit Diagnosis: Dysphagia, unspecified (R13.10) Plan: Continue with current plan of care            [place]wood D[place], [place], [place] Office Number: [phone]  Acie Fredrickson  04/03/2022, 9:27 AM

## 2022-04-03 NOTE — Discharge Instructions (Signed)
You were hospitalized for a rib fracture after falling out of your wheelchair. While in the hospital you swallowed food into your lungs, this is called aspiration. This led to a lung infection called pneumonia. You were in the ICU on a ventilator to help you breath and were eventually healthy enough to return to the general hospital floor. You completed antibiotics and your lung infection cleared up. While in the ICU we suspect you got an ulcer in your stomach due to low blood pressure. You vomited old blood and had a bowel movement with old blood. You remained healthy in regards to this and your blood counts improved as we monitored them so we do not suspect this is serious and should resolve on its own.

## 2022-04-03 NOTE — TOC Progression Note (Signed)
Transition of Care [facility]) - Progression Note    Patient Details  Name: [name] MRN: [id number] Date of Birth: 06/23/1946  Transition of Care [facility]) CM/SW Contact  Joanne Chars, LCSW Phone Number: 04/03/2022, 3:54 PM  Clinical Narrative:   TC Janet/[facility] regarding potential DC today.    1445: DC orders in, talked with [name], she will have to get transport set up. 1530: Kitty needing paperwork signed, trying to locate family member.  [name] is able to get transport--will use special wheelchair for more support. 1545: TC grandson Emmanuel.  Just was notified that DC is happening, was wanting more information on facility choices but will move forward with [facility].  He is aware he may be needed to sign paperwork.    Expected Discharge Plan: [place] Barriers to Discharge: No Barriers Identified  Expected Discharge Plan and Services Expected Discharge Plan: [place] Choice: [place] Living arrangements for the past 2 months: [place] Expected Discharge Date: 04/03/22                                     Social Determinants of Health (SDOH) Interventions    Readmission Risk Interventions     No data to display

## 2022-04-03 NOTE — TOC Transition Note (Signed)
Transition of Care [facility]) - CM/SW Discharge Note   Patient Details  Name: [name] MRN: [id number] Date of Birth: 01-30-1946  Transition of Care [facility]) CM/SW Contact:  Joanne Chars, LCSW Phone Number: 04/03/2022, 3:57 PM   Clinical Narrative:   Pt discharging to [facility].  RN call [phone] for report.  [facility] providing transportation.  Please call [name] at [phone] with any issues.     Final next level of care: Skilled Nursing Facility Barriers to Discharge: Barriers Resolved   Patient Goals and CMS Choice     Choice offered to / list presented to : Patient (grandson Jimmie Molly)  Discharge Placement              Patient chooses bed at:  [facility]) Patient to be transferred to facility by: [place] Name of family member notified: grandson Jimmie Molly Patient and family notified of of transfer: 04/03/22  Discharge Plan and Services     Post Acute Care Choice: [place]                               Social Determinants of Health (SDOH) Interventions     Readmission Risk Interventions     No data to display

## 2022-04-03 NOTE — Progress Notes (Signed)
Report called to donna, LPN at [facility].   Notified by [facility] representative that they will not be able to provide transportation and  [facility] will need to be requested.

## 2022-04-12 NOTE — Progress Notes (Signed)
Baseline renal function is unknown, and was improving at the time of discharge.  Her discharge function was CKD stage 3A, though this may simply be a stage she is transitioning through toward a higher baseline.

## 2022-05-30 ENCOUNTER — Ambulatory Visit: Payer: Medicare (Managed Care) | Admitting: Pulmonary Disease

## 2022-06-10 ENCOUNTER — Encounter: Payer: Self-pay | Admitting: Nurse Practitioner

## 2022-06-10 ENCOUNTER — Ambulatory Visit (INDEPENDENT_AMBULATORY_CARE_PROVIDER_SITE_OTHER): Payer: Medicare (Managed Care) | Admitting: Nurse Practitioner

## 2022-06-10 DIAGNOSIS — M0579 Rheumatoid arthritis with rheumatoid factor of multiple sites without organ or systems involvement: Secondary | ICD-10-CM | POA: Diagnosis not present

## 2022-06-10 DIAGNOSIS — J849 Interstitial pulmonary disease, unspecified: Secondary | ICD-10-CM

## 2022-06-10 NOTE — Patient Instructions (Signed)
Continue flonase nasal spray 2 sprays each nostril daily Continue protonix 1 tab daily   Follow up with Dr. Estanislado Pandy as scheduled   Continue working with physical therapy.   Follow up in 6 months with Dr. Elsworth Soho or sooner if needed

## 2022-06-10 NOTE — Assessment & Plan Note (Signed)
Previously on methotrexate and prednisone in 2013. No medications since. She has erosive arthritis. Rheumatology note reviewed. Deferred immunosuppressants; poor candidate. She was briefly restarted on daily prednisone from what I can see in her chart; however, she is not currently on this. Advised her to follow up as scheduled.

## 2022-06-10 NOTE — Assessment & Plan Note (Signed)
RA ILD/fibrosis. She would not be a good candidate for antifibrotic therapy. Respiratory status is stable. No dyspnea or increased cough. We will continue to monitor her symptoms and repeat testing as needed.  Patient Instructions  Continue flonase nasal spray 2 sprays each nostril daily Continue protonix 1 tab daily   Follow up with Dr. Estanislado Pandy as scheduled   Continue working with physical therapy.   Follow up in 6 months with Dr. Elsworth Soho or sooner if needed

## 2022-06-10 NOTE — Progress Notes (Signed)
$'@Patient'X$  ID: [name], female    DOB: 1946/02/20, 76 y.o.   MRN: [id number]  Chief Complaint  Patient presents with   Follow-up    Pt f/u she says her breathing is fine. Denies any SOB, but does states she has a cough occasionally    Referring provider: Janifer Adie, MD  HPI: 76 year old female, never smoker followed for RA ILD.  She is a patient of Dr. Bari Mantis and last seen in office 12/11/2021 by Volanda Napoleon NP.  Past medical history significant for PVD, CAD, hypertension, GERD, DM 2, hypothyroidism, IDA, protein calorie malnutrition.  She has rheumatoid arthritis and followed by Dr. Estanislado Pandy, on low dose daily prednisone. She is not a good candidate for methotrexate of sulfasalazine due to ILD and elevated creatinine. Opted against immunosuppressants.   TEST/EVENTS:  03/06/2021 HRCT chest: Mild progression of UIP. Unchanged LUL nodular area, measures 1.9x1.5cm. Retained secretions. Atherosclerosis.  03/22/2022 CT chest wo contrast: severe fibrotic lung disease with honeycombing at the bases. Airpsace opacity in the RUL. Lateral left fifth through seventh rib fractures. Posterior left seventh and eight rib fractures   12/11/2021: OV with Volanda Napoleon, NP. 6 month f/u. Previously seen by Dr. Elsworth Soho; not a good candidate for antifibrotics due to side effect profile and current health status. Immunosuppressants have been deferred. Still struggling with neck issues. Occasional productive cough; no significant dyspnea. Treated with doxycycline for acute bronchitis symptoms. Wheelchair bound since b/l amputation in 2021.   06/10/2022: Today - follow up Patient presents today for 6 month follow up. She was hospitalized August 2023 after fall with rib fractures; recovered well. She has no respiratory concerns today. She has an occasional dry cough, which is unchanged from her baseline. She really doesn't have many issues with shortness of breath. Still struggling with her chronic neck issues; she is working  with PT at [facility]. Not currently on any therapies for her RA. She had started on prednisone, according to review of her chart, but she does not think she is on this anymore and it was not on [facility] medication list from [facility]. Uses flonase for nasal congestion, which helps.   Allergies  Allergen Reactions   Feraheme [Ferumoxytol] Itching   Rosiglitazone Maleate Other (See Comments)    REACTION: Difficulty walking, Fatigue, shortness of breath    Immunization History  Administered Date(s) Administered   Influenza Split 05/14/2012   Influenza Whole 07/10/2007, 05/11/2008, 05/13/2008   Influenza,inj,Quad PF,6+ Mos 06/20/2017, 06/02/2019   Influenza-Unspecified 06/30/2018   Moderna Sars-Covid-2 Vaccination 10/27/2019, 12/07/2019   PFIZER Comirnaty(Gray Top)Covid-19 Tri-Sucrose Vaccine 11/28/2020   Pneumococcal Conjugate-13 01/14/2017   Pneumococcal Polysaccharide-23 07/19/1994, 02/17/2018   Td 10/22/2004   Tdap 02/24/2018, 12/10/2020    Past Medical History:  Diagnosis Date   Bilateral lower extremity edema 06/06/2014   Cataracts, bilateral 02/13/2011   Seen on eye exam 02/07/11. F/u in 12 months    Diabetes mellitus age 80   Diabetic ulcer of toe ([place]) 12/12/2015   Hyperlipidemia    Hypertension    Retinal detachment, old, partial    left   Retinopathy due to secondary diabetes mellitus ([place])    L>R, laser 3/07   Schizotypal personality disorder ([place])    Thyroid disease    Weight loss 01/22/2012    Tobacco History: Social History   Tobacco Use  Smoking Status Never   Passive exposure: Never  Smokeless Tobacco Never   Counseling given: Not Answered   Outpatient Medications Prior to Visit  Medication Sig Dispense Refill  acetaminophen (TYLENOL) 325 MG tablet Take 2 tablets (650 mg total) by mouth every 6 (six) hours. 30 tablet 0   amLODipine (NORVASC) 10 MG tablet Take 1 tablet (10 mg total) by mouth at bedtime. (Patient taking differently: Take 10 mg by mouth every morning.)  90 tablet 3   fluticasone (FLONASE) 50 MCG/ACT nasal spray Place 1 spray into both nostrils daily. 16 g 2   Incontinence Supply Disposable (INCONTINENCE BRIEF MEDIUM) MISC Wear as needed for incontinence 100 each 3   levothyroxine (SYNTHROID) 150 MCG tablet TAKE 1 TABLET BY MOUTH EVERY DAY 90 tablet 2   lidocaine (LIDODERM) 5 % Place 1 patch onto the skin daily. Remove & Discard patch within 12 hours or as directed by MD 30 patch 0   Misc. Devices MISC Mepilex silver sulfate-foam bandage 6x6" change dressing weekly 5 each 1   pantoprazole (PROTONIX) 40 MG tablet Take 2 tablets (80 mg total) by mouth daily. 30 tablet 0   PROAIR HFA 108 (90 Base) MCG/ACT inhaler INHALE 2 PUFFS INTO THE LUNGS EVERY 6 HOURS AS NEEDED FOR WHEEZING OR SHORTNESS OF BREATH 8.5 Inhaler 0   rosuvastatin (CRESTOR) 20 MG tablet Take 1 tablet (20 mg total) by mouth daily. (Patient taking differently: Take 20 mg by mouth every morning.) 90 tablet 3   No facility-administered medications prior to visit.     Review of Systems:   Constitutional: No weight loss or gain, night sweats, fevers, chills, fatigue, or lassitude. HEENT: No headaches, sneezing, itching, ear ache, nasal congestion, or post nasal drip CV:  No chest pain, orthopnea, PND, swelling in lower extremities, anasarca, dizziness, palpitations, syncope Resp: No shortness of breath with exertion or at rest. No excess mucus or change in color of mucus. No productive or non-productive. No hemoptysis. No wheezing.  No chest wall deformity GI:  No heartburn, indigestion Skin: No rash, lesions, ulcerations Neuro: No dizziness or lightheadedness.  Psych: No depression or anxiety. Mood stable.     Physical Exam:  BP 102/64   Pulse 79   Wt 84 lb 1.6 oz (38.1 kg)   SpO2 94%   BMI 15.38 kg/m   GEN: Pleasant, interactive, chronically ill appearing; in no acute distress HEENT:  Normocephalic and atraumatic. PERRLA. Sclera white. Nasal turbinates pink, moist and  patent bilaterally. No rhinorrhea present. Oropharynx pink and moist, without exudate or edema. No lesions, ulcerations, or postnasal drip.  NECK:  Supple. No JVD present. No lymphadenopathy.   CV: RRR, no m/r/g, no peripheral edema. Pulses intact, +2 bilaterally. No cyanosis, pallor or clubbing. PULMONARY:  Unlabored, regular breathing. Clear bilaterally A&P w/o wheezes/rales/rhonchi. No accessory muscle use.  GI: BS present and normoactive. Soft, non-tender to palpation. No organomegaly or masses detected.  MSK: No erythema, warmth or tenderness. Bilateral AKA.  Neuro: A/Ox3. No focal deficits noted.   Skin: Warm, no lesions or rashe Psych: Normal affect and behavior. Judgement and thought content appropriate.     Lab Results:  CBC    Component Value Date/Time   WBC 9.5 04/03/2022 0343   RBC 3.36 (L) 04/03/2022 0343   HGB 9.4 (L) 04/03/2022 0343   HGB 9.5 (L) 04/19/2021 1223   HCT 31.3 (L) 04/03/2022 0343   HCT 28.2 (L) 04/19/2021 1223   PLT 316 04/03/2022 0343   PLT 335 04/19/2021 1223   MCV 93.2 04/03/2022 0343   MCV 87 04/19/2021 1223   MCH 28.0 04/03/2022 0343   MCHC 30.0 04/03/2022 0343   RDW 13.7 04/03/2022 0343  RDW 11.9 04/19/2021 1223   LYMPHSABS 0.7 03/28/2022 0544   LYMPHSABS WILL FOLLOW 06/02/2019 1343   MONOABS 0.1 03/28/2022 0544   EOSABS 0.0 03/28/2022 0544   EOSABS WILL FOLLOW 06/02/2019 1343   BASOSABS 0.0 03/28/2022 0544   BASOSABS WILL FOLLOW 06/02/2019 1343    BMET    Component Value Date/Time   NA 143 04/03/2022 0343   NA 137 04/19/2021 1223   K 4.4 04/03/2022 0343   CL 115 (H) 04/03/2022 0343   CO2 24 04/03/2022 0343   GLUCOSE 113 (H) 04/03/2022 0343   BUN 37 (H) 04/03/2022 0343   BUN 25 04/19/2021 1223   CREATININE 1.12 (H) 04/03/2022 0343   CREATININE 1.55 (H) 01/01/2022 1036   CALCIUM 8.7 (L) 04/03/2022 0343   GFRNONAA 51 (L) 04/03/2022 0343   GFRAA 75 11/18/2019 1353    BNP    Component Value Date/Time   BNP 701.7 (H)  09/24/2019 0657     Imaging:  No results found.        No data to display          No results found for: "NITRICOXIDE"      Assessment & Plan:   ILD (interstitial lung disease) ([place]) RA ILD/fibrosis. She would not be a good candidate for antifibrotic therapy. Respiratory status is stable. No dyspnea or increased cough. We will continue to monitor her symptoms and repeat testing as needed.  Patient Instructions  Continue flonase nasal spray 2 sprays each nostril daily Continue protonix 1 tab daily   Follow up with Dr. Estanislado Pandy as scheduled   Continue working with physical therapy.   Follow up in 6 months with Dr. Elsworth Soho or sooner if needed     Rheumatoid arthritis [facility]) Previously on methotrexate and prednisone in 2013. No medications since. She has erosive arthritis. Rheumatology note reviewed. Deferred immunosuppressants; poor candidate. She was briefly restarted on daily prednisone from what I can see in her chart; however, she is not currently on this. Advised her to follow up as scheduled.    I spent 25 minutes of dedicated to the care of this patient on the date of this encounter to include pre-visit review of records, face-to-face time with the patient discussing conditions above, post visit ordering of testing, clinical documentation with the electronic health record, making appropriate referrals as documented, and communicating necessary findings to members of the patients care team.  Clayton Bibles, NP 06/10/2022  Pt aware and understands NP's role.

## 2022-06-26 ENCOUNTER — Encounter (INDEPENDENT_AMBULATORY_CARE_PROVIDER_SITE_OTHER): Payer: Medicare (Managed Care) | Admitting: Ophthalmology

## 2022-08-07 ENCOUNTER — Other Ambulatory Visit: Payer: Self-pay

## 2022-08-07 ENCOUNTER — Ambulatory Visit
Admission: RE | Admit: 2022-08-07 | Discharge: 2022-08-07 | Disposition: A | Discharge status: Home / Self Care | Payer: Medicare (Managed Care) | Source: Ambulatory Visit

## 2022-08-07 DIAGNOSIS — K219 Gastro-esophageal reflux disease without esophagitis: Secondary | ICD-10-CM

## 2022-08-13 ENCOUNTER — Emergency Department (HOSPITAL_COMMUNITY): Payer: Medicare (Managed Care)

## 2022-08-13 ENCOUNTER — Encounter (HOSPITAL_COMMUNITY): Payer: Self-pay

## 2022-08-13 ENCOUNTER — Inpatient Hospital Stay (HOSPITAL_COMMUNITY)
Admission: EM | Admit: 2022-08-13 | Discharge: 2022-08-19 | DRG: 682 | Disposition: A | Discharge status: Skilled Nursing Facility | Payer: Medicare (Managed Care) | Source: Skilled Nursing Facility | Attending: Internal Medicine | Admitting: Internal Medicine

## 2022-08-13 DIAGNOSIS — N179 Acute kidney failure, unspecified: Secondary | ICD-10-CM | POA: Diagnosis not present

## 2022-08-13 DIAGNOSIS — K2971 Gastritis, unspecified, with bleeding: Secondary | ICD-10-CM | POA: Diagnosis present

## 2022-08-13 DIAGNOSIS — Z79890 Hormone replacement therapy (postmenopausal): Secondary | ICD-10-CM

## 2022-08-13 DIAGNOSIS — Z8701 Personal history of pneumonia (recurrent): Secondary | ICD-10-CM

## 2022-08-13 DIAGNOSIS — E11319 Type 2 diabetes mellitus with unspecified diabetic retinopathy without macular edema: Secondary | ICD-10-CM | POA: Diagnosis present

## 2022-08-13 DIAGNOSIS — I2510 Atherosclerotic heart disease of native coronary artery without angina pectoris: Secondary | ICD-10-CM | POA: Diagnosis present

## 2022-08-13 DIAGNOSIS — I70203 Unspecified atherosclerosis of native arteries of extremities, bilateral legs: Secondary | ICD-10-CM | POA: Diagnosis present

## 2022-08-13 DIAGNOSIS — Z833 Family history of diabetes mellitus: Secondary | ICD-10-CM

## 2022-08-13 DIAGNOSIS — I129 Hypertensive chronic kidney disease with stage 1 through stage 4 chronic kidney disease, or unspecified chronic kidney disease: Secondary | ICD-10-CM | POA: Diagnosis present

## 2022-08-13 DIAGNOSIS — E039 Hypothyroidism, unspecified: Secondary | ICD-10-CM | POA: Diagnosis present

## 2022-08-13 DIAGNOSIS — K5730 Diverticulosis of large intestine without perforation or abscess without bleeding: Secondary | ICD-10-CM | POA: Diagnosis present

## 2022-08-13 DIAGNOSIS — K648 Other hemorrhoids: Secondary | ICD-10-CM | POA: Diagnosis present

## 2022-08-13 DIAGNOSIS — R109 Unspecified abdominal pain: Secondary | ICD-10-CM

## 2022-08-13 DIAGNOSIS — K635 Polyp of colon: Secondary | ICD-10-CM | POA: Diagnosis present

## 2022-08-13 DIAGNOSIS — E119 Type 2 diabetes mellitus without complications: Secondary | ICD-10-CM

## 2022-08-13 DIAGNOSIS — E875 Hyperkalemia: Secondary | ICD-10-CM | POA: Diagnosis not present

## 2022-08-13 DIAGNOSIS — E871 Hypo-osmolality and hyponatremia: Secondary | ICD-10-CM | POA: Diagnosis present

## 2022-08-13 DIAGNOSIS — D649 Anemia, unspecified: Secondary | ICD-10-CM

## 2022-08-13 DIAGNOSIS — K222 Esophageal obstruction: Secondary | ICD-10-CM | POA: Diagnosis present

## 2022-08-13 DIAGNOSIS — J189 Pneumonia, unspecified organism: Secondary | ICD-10-CM | POA: Diagnosis present

## 2022-08-13 DIAGNOSIS — Z888 Allergy status to other drugs, medicaments and biological substances status: Secondary | ICD-10-CM

## 2022-08-13 DIAGNOSIS — M069 Rheumatoid arthritis, unspecified: Secondary | ICD-10-CM | POA: Diagnosis present

## 2022-08-13 DIAGNOSIS — D696 Thrombocytopenia, unspecified: Secondary | ICD-10-CM

## 2022-08-13 DIAGNOSIS — R54 Age-related physical debility: Secondary | ICD-10-CM | POA: Diagnosis present

## 2022-08-13 DIAGNOSIS — Z993 Dependence on wheelchair: Secondary | ICD-10-CM

## 2022-08-13 DIAGNOSIS — Z79899 Other long term (current) drug therapy: Secondary | ICD-10-CM

## 2022-08-13 DIAGNOSIS — Z89619 Acquired absence of unspecified leg above knee: Secondary | ICD-10-CM

## 2022-08-13 DIAGNOSIS — E1151 Type 2 diabetes mellitus with diabetic peripheral angiopathy without gangrene: Secondary | ICD-10-CM | POA: Diagnosis present

## 2022-08-13 DIAGNOSIS — Z89611 Acquired absence of right leg above knee: Secondary | ICD-10-CM

## 2022-08-13 DIAGNOSIS — Z803 Family history of malignant neoplasm of breast: Secondary | ICD-10-CM

## 2022-08-13 DIAGNOSIS — I959 Hypotension, unspecified: Secondary | ICD-10-CM | POA: Diagnosis present

## 2022-08-13 DIAGNOSIS — E1122 Type 2 diabetes mellitus with diabetic chronic kidney disease: Secondary | ICD-10-CM | POA: Diagnosis present

## 2022-08-13 DIAGNOSIS — Z800 Family history of malignant neoplasm of digestive organs: Secondary | ICD-10-CM

## 2022-08-13 DIAGNOSIS — Z8249 Family history of ischemic heart disease and other diseases of the circulatory system: Secondary | ICD-10-CM

## 2022-08-13 DIAGNOSIS — R7989 Other specified abnormal findings of blood chemistry: Secondary | ICD-10-CM

## 2022-08-13 DIAGNOSIS — D62 Acute posthemorrhagic anemia: Secondary | ICD-10-CM | POA: Diagnosis not present

## 2022-08-13 DIAGNOSIS — Z89612 Acquired absence of left leg above knee: Secondary | ICD-10-CM

## 2022-08-13 DIAGNOSIS — N183 Chronic kidney disease, stage 3 unspecified: Secondary | ICD-10-CM | POA: Diagnosis present

## 2022-08-13 DIAGNOSIS — R935 Abnormal findings on diagnostic imaging of other abdominal regions, including retroperitoneum: Secondary | ICD-10-CM

## 2022-08-13 DIAGNOSIS — E785 Hyperlipidemia, unspecified: Secondary | ICD-10-CM | POA: Diagnosis present

## 2022-08-13 LAB — HEPATIC FUNCTION PANEL
ALT: 5 U/L (ref 0–44)
AST: 15 U/L (ref 15–41)
Albumin: 2 g/dL — ABNORMAL LOW (ref 3.5–5.0)
Alkaline Phosphatase: 51 U/L (ref 38–126)
Bilirubin, Direct: 0.2 mg/dL (ref 0.0–0.2)
Indirect Bilirubin: 0.9 mg/dL (ref 0.3–0.9)
Total Bilirubin: 1.1 mg/dL (ref 0.3–1.2)
Total Protein: 6.7 g/dL (ref 6.5–8.1)

## 2022-08-13 LAB — CBC WITH DIFFERENTIAL/PLATELET
Abs Immature Granulocytes: 0.1 10*3/uL — ABNORMAL HIGH (ref 0.00–0.07)
Basophils Absolute: 0 10*3/uL (ref 0.0–0.1)
Basophils Relative: 0 %
Eosinophils Absolute: 0 10*3/uL (ref 0.0–0.5)
Eosinophils Relative: 1 %
HCT: 25.1 % — ABNORMAL LOW (ref 36.0–46.0)
Hemoglobin: 7.7 g/dL — ABNORMAL LOW (ref 12.0–15.0)
Immature Granulocytes: 1 %
Lymphocytes Relative: 18 %
Lymphs Abs: 1.3 10*3/uL (ref 0.7–4.0)
MCH: 29.5 pg (ref 26.0–34.0)
MCHC: 30.7 g/dL (ref 30.0–36.0)
MCV: 96.2 fL (ref 80.0–100.0)
Monocytes Absolute: 0.5 10*3/uL (ref 0.1–1.0)
Monocytes Relative: 7 %
Neutro Abs: 5.4 10*3/uL (ref 1.7–7.7)
Neutrophils Relative %: 73 %
Platelets: 62 10*3/uL — ABNORMAL LOW (ref 150–400)
RBC: 2.61 MIL/uL — ABNORMAL LOW (ref 3.87–5.11)
RDW: 14 % (ref 11.5–15.5)
WBC: 7.4 10*3/uL (ref 4.0–10.5)
nRBC: 0 % (ref 0.0–0.2)

## 2022-08-13 LAB — CBG MONITORING, ED
Glucose-Capillary: 138 mg/dL — ABNORMAL HIGH (ref 70–99)
Glucose-Capillary: 254 mg/dL — ABNORMAL HIGH (ref 70–99)

## 2022-08-13 LAB — TROPONIN I (HIGH SENSITIVITY): Troponin I (High Sensitivity): 70 ng/L — ABNORMAL HIGH (ref <18)

## 2022-08-13 LAB — I-STAT CHEM 8, ED
BUN: 107 mg/dL — ABNORMAL HIGH (ref 8–23)
Calcium, Ion: 0.93 mmol/L — ABNORMAL LOW (ref 1.15–1.40)
Chloride: 110 mmol/L (ref 98–111)
Creatinine, Ser: 3 mg/dL — ABNORMAL HIGH (ref 0.44–1.00)
Glucose, Bld: 143 mg/dL — ABNORMAL HIGH (ref 70–99)
HCT: 24 % — ABNORMAL LOW (ref 36.0–46.0)
Hemoglobin: 8.2 g/dL — ABNORMAL LOW (ref 12.0–15.0)
Potassium: 6.6 mmol/L (ref 3.5–5.1)
Sodium: 133 mmol/L — ABNORMAL LOW (ref 135–145)
TCO2: 19 mmol/L — ABNORMAL LOW (ref 22–32)

## 2022-08-13 LAB — BASIC METABOLIC PANEL
Anion gap: 10 (ref 5–15)
BUN: 94 mg/dL — ABNORMAL HIGH (ref 8–23)
CO2: 15 mmol/L — ABNORMAL LOW (ref 22–32)
Calcium: 8.2 mg/dL — ABNORMAL LOW (ref 8.9–10.3)
Chloride: 109 mmol/L (ref 98–111)
Creatinine, Ser: 2.89 mg/dL — ABNORMAL HIGH (ref 0.44–1.00)
GFR, Estimated: 16 mL/min — ABNORMAL LOW (ref >60)
Glucose, Bld: 144 mg/dL — ABNORMAL HIGH (ref 70–99)
Potassium: 6.5 mmol/L (ref 3.5–5.1)
Sodium: 134 mmol/L — ABNORMAL LOW (ref 135–145)

## 2022-08-13 LAB — MAGNESIUM: Magnesium: 1.9 mg/dL (ref 1.7–2.4)

## 2022-08-13 MED ORDER — SODIUM ZIRCONIUM CYCLOSILICATE 10 G PO PACK
10.0000 g | PACK | Freq: Once | ORAL | Status: AC
  Administered 2022-08-13: 10 g via ORAL
  Filled 2022-08-13: qty 1

## 2022-08-13 MED ORDER — INSULIN ASPART 100 UNIT/ML IV SOLN
5.0000 [IU] | Freq: Once | INTRAVENOUS | Status: AC
  Administered 2022-08-13: 5 [IU] via INTRAVENOUS

## 2022-08-13 MED ORDER — DEXTROSE 50 % IV SOLN
1.0000 | Freq: Once | INTRAVENOUS | Status: AC
  Administered 2022-08-13: 50 mL via INTRAVENOUS
  Filled 2022-08-13: qty 50

## 2022-08-13 MED ORDER — DEXTROSE 10 % IV SOLN: Freq: Once | INTRAVENOUS | Status: AC

## 2022-08-13 MED ORDER — SODIUM CHLORIDE 0.9 % IV BOLUS
1000.0000 mL | Freq: Once | INTRAVENOUS | Status: AC
  Administered 2022-08-13: 1000 mL via INTRAVENOUS

## 2022-08-13 NOTE — ED Notes (Signed)
Critical istat values reported to Dr. Doren Custard '@2235'$ .

## 2022-08-13 NOTE — ED Provider Notes (Signed)
[facility] EMERGENCY DEPARTMENT Provider Note   CSN: [id number] Arrival date & time: 08/13/22  2115     History {Add pertinent medical, surgical, social history, OB history to HPI:1} No chief complaint on file.   [name] is a 76 y.o. female.  HPI Patient presents for concern of hyperkalemia.  Medical history includes hypothyroidism, T2DM, HTN, rheumatoid arthritis, anemia, interstitial lung disease, PVD, CAD.  She was reportedly living at home last week.  On Friday, she went to [facility] rehab facility.  EMS reports that she went there for rib fractures.  However, she was started on antibiotics for treatment of pneumonia 2 days ago.  Recent lab work, drawn at facility, showed elevated potassium and BUN.  For this reason, she was brought to the ED via EMS.  Patient states that she has had a productive cough seems to be improving.  She has not current pain in the right side of her neck.  She denies any other areas of discomfort.    Home Medications Prior to Admission medications   Medication Sig Start Date End Date Taking? Authorizing Provider  acetaminophen (TYLENOL) 325 MG tablet Take 2 tablets (650 mg total) by mouth every 6 (six) hours. 10/02/19   Nuala Alpha, MD  amLODipine (NORVASC) 10 MG tablet Take 1 tablet (10 mg total) by mouth at bedtime. Patient taking differently: Take 10 mg by mouth every morning. 04/19/21   [name], MD  fluticasone (FLONASE) 50 MCG/ACT nasal spray Place 1 spray into both nostrils daily. 12/11/21   Martyn Ehrich, NP  Incontinence Supply Disposable (INCONTINENCE BRIEF MEDIUM) MISC Wear as needed for incontinence 03/23/20   [name], MD  levothyroxine (SYNTHROID) 150 MCG tablet TAKE 1 TABLET BY MOUTH EVERY DAY 04/04/21   [name], MD  lidocaine (LIDODERM) 5 % Place 1 patch onto the skin daily. Remove & Discard patch within 12 hours or as directed by MD 04/04/22   Iona Coach, MD  Misc. Devices  MISC Mepilex silver sulfate-foam bandage 6x6" change dressing weekly 12/15/20   [name], MD  pantoprazole (PROTONIX) 40 MG tablet Take 2 tablets (80 mg total) by mouth daily. 04/03/22 04/03/23  Iona Coach, MD  PROAIR HFA 108 [phone] Base) MCG/ACT inhaler INHALE 2 PUFFS INTO THE LUNGS EVERY 6 HOURS AS NEEDED FOR WHEEZING OR SHORTNESS OF BREATH 08/31/18   [name], MD  rosuvastatin (CRESTOR) 20 MG tablet Take 1 tablet (20 mg total) by mouth daily. Patient taking differently: Take 20 mg by mouth every morning. 12/27/20   [name], MD      Allergies    Feraheme [ferumoxytol] and Rosiglitazone maleate    Review of Systems   Review of Systems  Respiratory:  Positive for cough.   Musculoskeletal:  Positive for neck pain.  Neurological:  Positive for weakness (Generalized).  All other systems reviewed and are negative.   Physical Exam Updated Vital Signs There were no vitals taken for this visit. Physical Exam Vitals and nursing note reviewed.  Constitutional:      General: She is not in acute distress.    Appearance: Normal appearance. She is well-developed. She is ill-appearing (Chronically). She is not toxic-appearing or diaphoretic.  HENT:     Head: Normocephalic and atraumatic.     Right Ear: External ear normal.     Left Ear: External ear normal.     Nose: Nose normal.     Mouth/Throat:  Mouth: Mucous membranes are moist.  Eyes:     Extraocular Movements: Extraocular movements intact.     Conjunctiva/sclera: Conjunctivae normal.  Cardiovascular:     Rate and Rhythm: Normal rate and regular rhythm.     Heart sounds: No murmur heard. Pulmonary:     Effort: Pulmonary effort is normal. No respiratory distress.     Breath sounds: Rales present. No wheezing.  Chest:     Chest wall: No tenderness.  Abdominal:     General: There is no distension.     Palpations: Abdomen is soft.     Tenderness: There is no abdominal tenderness.  Musculoskeletal:         General: No swelling.     Cervical back: Neck supple.     Comments: Bilateral AKA's  Skin:    General: Skin is warm and dry.     Capillary Refill: Capillary refill takes less than 2 seconds.     Coloration: Skin is not jaundiced or pale.  Neurological:     General: No focal deficit present.     Mental Status: She is alert and oriented to person, place, and time.     Cranial Nerves: No cranial nerve deficit.     Sensory: No sensory deficit.     Motor: No weakness.     Coordination: Coordination normal.  Psychiatric:        Mood and Affect: Mood normal.        Behavior: Behavior normal.        Thought Content: Thought content normal.        Judgment: Judgment normal.     ED Results / Procedures / Treatments   Labs (all labs ordered are listed, but only abnormal results are displayed) Labs Reviewed - No data to display  EKG None  Radiology No results found.  Procedures Procedures  {Document cardiac monitor, telemetry assessment procedure when appropriate:1}  Medications Ordered in ED Medications - No data to display  ED Course/ Medical Decision Making/ A&P                           Medical Decision Making Amount and/or Complexity of Data Reviewed Labs: ordered. Radiology: ordered.   Patient resents for [place] from rehab facility for concern of hyperkalemia.  This was identified on lab work obtained at facility.  Potassium was reportedly in the range of 6.5 and BUN is elevated in the 80s.  She has known CKD.  On assessment, patient resting on stretcher.  She has right-sided neck discomfort but she is also laterally extending her neck on that side.  She has lidocaine patches on both AKA stumps in addition to the left part of her chest.  She denies current pain in these areas.  Laboratory workup was initiated to confirm or deny recent findings.***.  {Document critical care time when appropriate:1} {Document review of labs and clinical decision tools ie heart  score, Chads2Vasc2 etc:1}  {Document your independent review of radiology images, and any outside records:1} {Document your discussion with family members, caretakers, and with consultants:1} {Document social determinants of health affecting pt's care:1} {Document your decision making why or why not admission, treatments were needed:1} Final Clinical Impression(s) / ED Diagnoses Final diagnoses:  None    Rx / DC Orders ED Discharge Orders     None

## 2022-08-14 ENCOUNTER — Emergency Department (HOSPITAL_COMMUNITY): Payer: Medicare (Managed Care)

## 2022-08-14 DIAGNOSIS — Z89612 Acquired absence of left leg above knee: Secondary | ICD-10-CM | POA: Diagnosis not present

## 2022-08-14 DIAGNOSIS — I70203 Unspecified atherosclerosis of native arteries of extremities, bilateral legs: Secondary | ICD-10-CM | POA: Diagnosis present

## 2022-08-14 DIAGNOSIS — E875 Hyperkalemia: Secondary | ICD-10-CM | POA: Diagnosis present

## 2022-08-14 DIAGNOSIS — D122 Benign neoplasm of ascending colon: Secondary | ICD-10-CM | POA: Diagnosis not present

## 2022-08-14 DIAGNOSIS — Z89619 Acquired absence of unspecified leg above knee: Secondary | ICD-10-CM

## 2022-08-14 DIAGNOSIS — E039 Hypothyroidism, unspecified: Secondary | ICD-10-CM

## 2022-08-14 DIAGNOSIS — N183 Chronic kidney disease, stage 3 unspecified: Secondary | ICD-10-CM | POA: Diagnosis present

## 2022-08-14 DIAGNOSIS — D126 Benign neoplasm of colon, unspecified: Secondary | ICD-10-CM | POA: Diagnosis not present

## 2022-08-14 DIAGNOSIS — E1122 Type 2 diabetes mellitus with diabetic chronic kidney disease: Secondary | ICD-10-CM | POA: Diagnosis present

## 2022-08-14 DIAGNOSIS — R7989 Other specified abnormal findings of blood chemistry: Secondary | ICD-10-CM

## 2022-08-14 DIAGNOSIS — D649 Anemia, unspecified: Secondary | ICD-10-CM | POA: Diagnosis not present

## 2022-08-14 DIAGNOSIS — E11319 Type 2 diabetes mellitus with unspecified diabetic retinopathy without macular edema: Secondary | ICD-10-CM | POA: Diagnosis present

## 2022-08-14 DIAGNOSIS — I2510 Atherosclerotic heart disease of native coronary artery without angina pectoris: Secondary | ICD-10-CM | POA: Diagnosis present

## 2022-08-14 DIAGNOSIS — E785 Hyperlipidemia, unspecified: Secondary | ICD-10-CM | POA: Diagnosis present

## 2022-08-14 DIAGNOSIS — D509 Iron deficiency anemia, unspecified: Secondary | ICD-10-CM | POA: Diagnosis not present

## 2022-08-14 DIAGNOSIS — M069 Rheumatoid arthritis, unspecified: Secondary | ICD-10-CM | POA: Diagnosis present

## 2022-08-14 DIAGNOSIS — I10 Essential (primary) hypertension: Secondary | ICD-10-CM | POA: Diagnosis not present

## 2022-08-14 DIAGNOSIS — K648 Other hemorrhoids: Secondary | ICD-10-CM | POA: Diagnosis not present

## 2022-08-14 DIAGNOSIS — I959 Hypotension, unspecified: Secondary | ICD-10-CM | POA: Diagnosis present

## 2022-08-14 DIAGNOSIS — E871 Hypo-osmolality and hyponatremia: Secondary | ICD-10-CM | POA: Diagnosis present

## 2022-08-14 DIAGNOSIS — I129 Hypertensive chronic kidney disease with stage 1 through stage 4 chronic kidney disease, or unspecified chronic kidney disease: Secondary | ICD-10-CM | POA: Diagnosis present

## 2022-08-14 DIAGNOSIS — K5730 Diverticulosis of large intestine without perforation or abscess without bleeding: Secondary | ICD-10-CM | POA: Diagnosis present

## 2022-08-14 DIAGNOSIS — R54 Age-related physical debility: Secondary | ICD-10-CM | POA: Diagnosis present

## 2022-08-14 DIAGNOSIS — K6389 Other specified diseases of intestine: Secondary | ICD-10-CM | POA: Diagnosis not present

## 2022-08-14 DIAGNOSIS — E1151 Type 2 diabetes mellitus with diabetic peripheral angiopathy without gangrene: Secondary | ICD-10-CM | POA: Diagnosis present

## 2022-08-14 DIAGNOSIS — R935 Abnormal findings on diagnostic imaging of other abdominal regions, including retroperitoneum: Secondary | ICD-10-CM | POA: Diagnosis not present

## 2022-08-14 DIAGNOSIS — E1152 Type 2 diabetes mellitus with diabetic peripheral angiopathy with gangrene: Secondary | ICD-10-CM

## 2022-08-14 DIAGNOSIS — K635 Polyp of colon: Secondary | ICD-10-CM | POA: Diagnosis present

## 2022-08-14 DIAGNOSIS — K2971 Gastritis, unspecified, with bleeding: Secondary | ICD-10-CM | POA: Diagnosis present

## 2022-08-14 DIAGNOSIS — D125 Benign neoplasm of sigmoid colon: Secondary | ICD-10-CM | POA: Diagnosis not present

## 2022-08-14 DIAGNOSIS — N179 Acute kidney failure, unspecified: Secondary | ICD-10-CM

## 2022-08-14 DIAGNOSIS — K222 Esophageal obstruction: Secondary | ICD-10-CM | POA: Diagnosis present

## 2022-08-14 DIAGNOSIS — D696 Thrombocytopenia, unspecified: Secondary | ICD-10-CM | POA: Diagnosis present

## 2022-08-14 DIAGNOSIS — Z89611 Acquired absence of right leg above knee: Secondary | ICD-10-CM | POA: Diagnosis not present

## 2022-08-14 DIAGNOSIS — D62 Acute posthemorrhagic anemia: Secondary | ICD-10-CM | POA: Diagnosis not present

## 2022-08-14 DIAGNOSIS — R109 Unspecified abdominal pain: Secondary | ICD-10-CM | POA: Diagnosis not present

## 2022-08-14 DIAGNOSIS — J189 Pneumonia, unspecified organism: Secondary | ICD-10-CM | POA: Diagnosis present

## 2022-08-14 LAB — IRON AND TIBC
Iron: 53 ug/dL (ref 28–170)
Saturation Ratios: UNDETERMINED % (ref 10.4–31.8)
TIBC: UNDETERMINED ug/dL (ref 250–450)
UIBC: UNDETERMINED ug/dL

## 2022-08-14 LAB — BASIC METABOLIC PANEL
Anion gap: 8 (ref 5–15)
Anion gap: 7 (ref 5–15)
BUN: 86 mg/dL — ABNORMAL HIGH (ref 8–23)
BUN: 78 mg/dL — ABNORMAL HIGH (ref 8–23)
CO2: 15 mmol/L — ABNORMAL LOW (ref 22–32)
CO2: 16 mmol/L — ABNORMAL LOW (ref 22–32)
Calcium: 7.8 mg/dL — ABNORMAL LOW (ref 8.9–10.3)
Calcium: 7.8 mg/dL — ABNORMAL LOW (ref 8.9–10.3)
Chloride: 108 mmol/L (ref 98–111)
Chloride: 110 mmol/L (ref 98–111)
Creatinine, Ser: 2.51 mg/dL — ABNORMAL HIGH (ref 0.44–1.00)
Creatinine, Ser: 2.36 mg/dL — ABNORMAL HIGH (ref 0.44–1.00)
GFR, Estimated: 19 mL/min — ABNORMAL LOW (ref >60)
GFR, Estimated: 21 mL/min — ABNORMAL LOW (ref >60)
Glucose, Bld: 171 mg/dL — ABNORMAL HIGH (ref 70–99)
Glucose, Bld: 126 mg/dL — ABNORMAL HIGH (ref 70–99)
Potassium: 5.6 mmol/L — ABNORMAL HIGH (ref 3.5–5.1)
Potassium: 5.2 mmol/L — ABNORMAL HIGH (ref 3.5–5.1)
Sodium: 131 mmol/L — ABNORMAL LOW (ref 135–145)
Sodium: 133 mmol/L — ABNORMAL LOW (ref 135–145)

## 2022-08-14 LAB — CBC
HCT: 22.9 % — ABNORMAL LOW (ref 36.0–46.0)
Hemoglobin: 6.6 g/dL — CL (ref 12.0–15.0)
MCH: 28.3 pg (ref 26.0–34.0)
MCHC: 28.8 g/dL — ABNORMAL LOW (ref 30.0–36.0)
MCV: 98.3 fL (ref 80.0–100.0)
Platelets: 254 10*3/uL (ref 150–400)
RBC: 2.33 MIL/uL — ABNORMAL LOW (ref 3.87–5.11)
RDW: 14 % (ref 11.5–15.5)
WBC: 6.2 10*3/uL (ref 4.0–10.5)
nRBC: 0 % (ref 0.0–0.2)

## 2022-08-14 LAB — TYPE AND SCREEN
ABO/RH(D): A POS
Antibody Screen: NEGATIVE
Unit division: 0

## 2022-08-14 LAB — URINALYSIS, ROUTINE W REFLEX MICROSCOPIC
Bilirubin Urine: NEGATIVE
Glucose, UA: NEGATIVE mg/dL
Hgb urine dipstick: NEGATIVE
Ketones, ur: NEGATIVE mg/dL
Nitrite: NEGATIVE
Protein, ur: NEGATIVE mg/dL
Specific Gravity, Urine: 1.014 (ref 1.005–1.030)
pH: 5 (ref 5.0–8.0)

## 2022-08-14 LAB — HEMOGLOBIN AND HEMATOCRIT, BLOOD
HCT: 24.3 % — ABNORMAL LOW (ref 36.0–46.0)
Hemoglobin: 7.2 g/dL — ABNORMAL LOW (ref 12.0–15.0)

## 2022-08-14 LAB — RETICULOCYTES
Immature Retic Fract: 7.5 % (ref 2.3–15.9)
RBC.: 2.45 MIL/uL — ABNORMAL LOW (ref 3.87–5.11)
Retic Count, Absolute: 14.9 10*3/uL — ABNORMAL LOW (ref 19.0–186.0)
Retic Ct Pct: 0.6 % (ref 0.4–3.1)

## 2022-08-14 LAB — VITAMIN B12: Vitamin B-12: 1036 pg/mL — ABNORMAL HIGH (ref 180–914)

## 2022-08-14 LAB — TROPONIN I (HIGH SENSITIVITY): Troponin I (High Sensitivity): 68 ng/L — ABNORMAL HIGH (ref <18)

## 2022-08-14 LAB — BPAM RBC
Blood Product Expiration Date: 202401182359
Unit Type and Rh: 6200

## 2022-08-14 LAB — TSH: TSH: 3.078 u[IU]/mL (ref 0.350–4.500)

## 2022-08-14 LAB — PREPARE RBC (CROSSMATCH)

## 2022-08-14 LAB — FOLATE: Folate: 5.5 ng/mL — ABNORMAL LOW (ref >5.9)

## 2022-08-14 LAB — FERRITIN: Ferritin: 1330 ng/mL — ABNORMAL HIGH (ref 11–307)

## 2022-08-14 LAB — CREATININE, URINE, RANDOM: Creatinine, Urine: 84 mg/dL

## 2022-08-14 LAB — SODIUM, URINE, RANDOM: Sodium, Ur: 18 mmol/L

## 2022-08-14 MED ORDER — PANTOPRAZOLE INFUSION (NEW) - SIMPLE MED
8.0000 mg/h | INTRAVENOUS | Status: DC
  Administered 2022-08-14: 8 mg/h via INTRAVENOUS
  Filled 2022-08-14: qty 100

## 2022-08-14 MED ORDER — IPRATROPIUM-ALBUTEROL 0.5-2.5 (3) MG/3ML IN SOLN: 3.0000 mL | Freq: Four times a day (QID) | RESPIRATORY_TRACT | Status: DC | PRN

## 2022-08-14 MED ORDER — PANTOPRAZOLE SODIUM 40 MG PO TBEC
40.0000 mg | DELAYED_RELEASE_TABLET | Freq: Every day | ORAL | Status: DC
  Administered 2022-08-15 – 2022-08-16 (×2): 40 mg via ORAL
  Filled 2022-08-14 (×2): qty 1

## 2022-08-14 MED ORDER — ENOXAPARIN SODIUM 300 MG/3ML IJ SOLN
20.0000 mg | INTRAMUSCULAR | Status: DC
  Filled 2022-08-14 (×2): qty 0.2

## 2022-08-14 MED ORDER — PANTOPRAZOLE 80MG IVPB - SIMPLE MED
80.0000 mg | Freq: Once | INTRAVENOUS | Status: AC
  Administered 2022-08-14: 80 mg via INTRAVENOUS
  Filled 2022-08-14: qty 100

## 2022-08-14 MED ORDER — LEVOTHYROXINE SODIUM 100 MCG PO TABS
100.0000 ug | ORAL_TABLET | Freq: Every day | ORAL | Status: DC
  Administered 2022-08-15 – 2022-08-19 (×4): 100 ug via ORAL
  Filled 2022-08-14 (×5): qty 1

## 2022-08-14 MED ORDER — SODIUM CHLORIDE 0.9% IV SOLUTION: Freq: Once | INTRAVENOUS | Status: AC

## 2022-08-14 MED ORDER — SODIUM ZIRCONIUM CYCLOSILICATE 10 G PO PACK
10.0000 g | PACK | Freq: Once | ORAL | Status: AC
  Administered 2022-08-14: 10 g via ORAL
  Filled 2022-08-14: qty 1

## 2022-08-14 MED ORDER — SODIUM CHLORIDE 0.9 % IV SOLN
1.0000 g | INTRAVENOUS | Status: AC
  Administered 2022-08-14 – 2022-08-17 (×4): 1 g via INTRAVENOUS
  Filled 2022-08-14 (×4): qty 10

## 2022-08-14 MED ORDER — LACTATED RINGERS IV SOLN: INTRAVENOUS | Status: AC

## 2022-08-14 MED ORDER — PANTOPRAZOLE SODIUM 40 MG IV SOLR: 40.0000 mg | Freq: Two times a day (BID) | INTRAVENOUS | Status: DC

## 2022-08-14 MED ORDER — PREDNISONE 10 MG PO TABS
5.0000 mg | ORAL_TABLET | Freq: Every day | ORAL | Status: DC
  Administered 2022-08-15 – 2022-08-19 (×4): 5 mg via ORAL
  Filled 2022-08-14 (×5): qty 1

## 2022-08-14 NOTE — Assessment & Plan Note (Signed)
-  unclear etiology. Pt denies NSAIDS use. Unclear of her medication list but no contributory meds based on what is current available on Epic.  -renal ultrasound is pending -UA, urine sodium and urine creatinine pending -keep on continuous IV LR fluid -avoid contrasted study -renally dose all medications

## 2022-08-14 NOTE — Plan of Care (Signed)

## 2022-08-14 NOTE — TOC Initial Note (Signed)
Transition of Care [facility]) - Initial/Assessment Note    Patient Details  Name: [name] MRN: [id number] Date of Birth: [birth year]  [facility]) CM/SW Contact:    Benard Halsted, LCSW Phone Number: 08/14/2022, 3:34 PM  Clinical Narrative:                 CSW received call from patient's [facility] SW [name] [phone]). She stated patient went to [employer] for [employer] as those in the home are not helping patient (situation with others living in the home that should not be there is still ongoing with no legal recourse per [name]). Plan at discharge will be for patient to return to [facility] for management.    Expected Discharge Plan: Skilled Nursing Facility Barriers to Discharge: Continued Medical Work up   Patient Goals and CMS Choice Patient states their goals for this hospitalization and ongoing recovery are:: Feel better CMS Medicare.gov Compare Post Acute Care list provided to:: Patient Choice offered to / list presented to : Patient [place] ownership interest in [facility].provided to:: Patient    Expected Discharge Plan and Services In-house Referral: Clinical Social Work   Post Acute Care Choice: [place] Living arrangements for the past 2 months: [place], [place]                                      Prior Living Arrangements/Services Living arrangements for the past 2 months: [place], [place] Lives with:: Relatives Patient language and need for interpreter reviewed:: Yes Do you feel safe going back to the place where you live?: Yes      Need for Family Participation in Patient Care: Yes (Comment) Care giver support system in place?: No (comment) Current home services:  ([facility]) Criminal Activity/Legal Involvement Pertinent to Current Situation/Hospitalization: No - Comment as needed  Activities of Daily Living      Permission  Sought/Granted Permission sought to share information with : Facility Sport and exercise psychologist, Family Supports Permission granted to share information with : Yes, Verbal Permission Granted     Permission granted to share info w AGENCY: [facility]        Emotional Assessment Appearance:: Appears stated age     Orientation: : Oriented to Self, Oriented to Place, Oriented to  Time, Oriented to Situation Alcohol / Substance Use: Not Applicable Psych Involvement: No (comment)  Admission diagnosis:  Hyperkalemia [E87.5] Acute renal failure (ARF) (HCC) [N17.9] AKI (acute kidney injury) ([place]) [N17.9] Patient Active Problem List   Diagnosis Date Noted   Hyperkalemia 08/14/2022   Anemia 08/14/2022   Elevated troponin 08/14/2022   Thrombocytopenia ([place]) 08/14/2022   Acute renal failure (ARF) ([place]) 08/14/2022   S/P AKA (above knee amputation) ([place]) 08/14/2022   Protein-calorie malnutrition, severe 03/28/2022   Acute respiratory failure with hypoxia ([place])    Multiple rib fractures 03/22/2022   Acute renal failure ([place])    Failure to attend appointment with reason given 11/09/2021   Exudative age-related macular degeneration of left eye with active choroidal neovascularization ([place]) 05/01/2021   Deep retinal hemorrhage, left 05/01/2021   High risk social situation 04/25/2021   CAD (coronary artery disease) 04/25/2021   Aortic atherosclerosis ([place]) 04/25/2021   Vertebral fracture, osteoporotic ([place]) 01/22/2021   ILD (interstitial lung disease) ([place]) 11/28/2020   Controlled type 2 diabetes mellitus with stable proliferative retinopathy of  both eyes, with long-term current use of insulin ([place]) 08/07/2020   Pseudophakia of both eyes 08/07/2020   At high risk for pressure injury of skin 12/16/2019   Uncontrolled type 2 diabetes mellitus with hyperglycemia ([place]) 11/12/2019   S/P AKA (above knee amputation) bilateral ([place]) 11/12/2019   Food insecurity 11/12/2019   Wound infection     Moderate protein-calorie malnutrition ([place])    Left breast mass 06/02/2019   Stress 06/02/2019   Peripheral vascular disease ([place]) 07/22/2018   Dyspnea 03/23/2018   Impaired functional mobility, balance, gait, and endurance 03/02/2018    Class: Diagnosis of   Abnormality of gait 02/26/2018   Iron deficiency anemia 03/19/2017   Vision disturbance 03/20/2015   Memory difficulty 03/20/2015   At high risk for falls 11/10/2012   DIABETIC  RETINOPATHY 03/17/2007   Type 2 diabetes mellitus with diabetic neuropathy, unspecified ([place]) 03/17/2007   Hypothyroidism 10/16/2006   Type 2 diabetes mellitus ([place]) 10/16/2006   OBESITY, NOS 10/16/2006   HYPERTENSION, BENIGN SYSTEMIC 10/16/2006   GASTROESOPHAGEAL REFLUX, NO ESOPHAGITIS 10/16/2006   Rheumatoid arthritis ([place]) 10/16/2006   Disorder of bone and cartilage 10/16/2006   INCONTINENCE, URGE 10/16/2006   PCP:  Janifer Adie, MD Pharmacy:   [facility] [place] [address] [place] [address] [place] [address] Phone: [phone] Fax: [phone]     Social Determinants of Health ([place]) Social History: SDOH Screenings   Transportation Needs: No Transportation Needs (08/14/2022)  Depression (PHQ2-9): Low Risk  (03/08/2021)  Tobacco Use: Low Risk  (06/10/2022)   SDOH Interventions:     Readmission Risk Interventions     No data to display

## 2022-08-14 NOTE — Assessment & Plan Note (Signed)
Wheelchair bound

## 2022-08-14 NOTE — Assessment & Plan Note (Signed)
-  K of 6.5 secondary to acute renal failure -administered dextrose with insulin and Lokelma in ED -follow repeat potassium

## 2022-08-14 NOTE — Assessment & Plan Note (Addendum)
Normocytic anemia -Hgb of 7.7 and has downward trend to 9.4. Could be due to worsening renal function but pt reports seeing dark urine and dark stool. Consider possible GI bleed. Check FOBT.  -continue to follow Hgb

## 2022-08-14 NOTE — H&P (Signed)
History and Physical    Patient: [name] AST:[id number] DOB: [birth year] DOA: 08/13/2022 DOS: the patient was seen and examined on 08/14/2022 PCP: Janifer Adie, MD  Patient coming from: Home  Chief Complaint:  Chief Complaint  Patient presents with   abnormal labs    Patient here BIB EMS for high potassium, high BUN and suspected PNA, CKD stage 3, A&Ox4, rib fx present already, bilateral AKA   HPI: [name] is a 76 y.o. female with medical history significant of HTN, ILD, T2DM, PAD s/p bilateral AKA wheelchair bound who was sent from SNF for abnormal labs  with acute renal failure and hyperkalemia.   She is asymptomatic otherwise. Has been eating and drinking well. No vomiting or diarrhea. Feels urine output has been normal although has noticed recently that her urine and stool are dark. Thinks she was just put on iron supplementation. No abdominal or flank pain. Denies NSAIDS although unsure what medications they give her at [facility].   In the ED, Potassium at 6.5 with no EKG changes, creatinine of 2.89 with GFR of 16. Anion gap of 10.  WBC of 7.4. Worsening anemia of 7.7 down from 9.4 (4 months ago),plt of 62.  Troponin elevated to 70.   She was given dextrose with insulin, a liter of IV fluids and Lokelma for her hyperkalemia.  Review of Systems: As mentioned in the history of present illness. All other systems reviewed and are negative. Past Medical History:  Diagnosis Date   Bilateral lower extremity edema 06/06/2014   Cataracts, bilateral 02/13/2011   Seen on eye exam 02/07/11. F/u in 12 months    Diabetes mellitus age 50   Diabetic ulcer of toe ([place]) 12/12/2015   Hyperlipidemia    Hypertension    Retinal detachment, old, partial    left   Retinopathy due to secondary diabetes mellitus ([place])    L>R, laser 3/07   Schizotypal personality disorder ([place])    Thyroid disease    Weight loss 01/22/2012   Past Surgical History:  Procedure Laterality Date    ABDOMINAL AORTOGRAM W/LOWER EXTREMITY N/A 07/23/2018   Procedure: ABDOMINAL AORTOGRAM W/LOWER EXTREMITY;  Surgeon: Marty Heck, MD;  Location: [facility];  Service: Cardiovascular;  Laterality: N/A;   ABOVE KNEE LEG AMPUTATION Bilateral 09/27/2019   AMPUTATION Bilateral 09/27/2019   Procedure: AMPUTATION ABOVE KNEE;  Surgeon: Angelia Mould, MD;  Location: [facility] OR;  Service: Vascular;  Laterality: Bilateral;   BREAST EXCISIONAL BIOPSY Bilateral    BREAST SURGERY     CATARACT EXTRACTION     left    EYE SURGERY     ROTATOR CUFF REPAIR  1990's   left   Thyroid radiation ablation     for Graves Disease   TRANSTHORACIC ECHOCARDIOGRAM      EF55-65%, nml - 12/14/2004   Social History:  reports that she has never smoked. She has never been exposed to tobacco smoke. She has never used smokeless tobacco. She reports that she does not drink alcohol and does not use drugs.  Allergies  Allergen Reactions   Feraheme [Ferumoxytol] Itching   Rosiglitazone Maleate Other (See Comments)    REACTION: Difficulty walking, Fatigue, shortness of breath    Family History  Problem Relation Age of Onset   Heart disease Mother    Hypertension Mother    Heart attack Mother    Pulmonary fibrosis Father    Liver disease Sister        transplant,  liver   Colon cancer Brother    Liver cancer Brother    Colon cancer Maternal Grandmother    Diabetes Daughter    Breast cancer Niece     Prior to Admission medications   Medication Sig Start Date End Date Taking? Authorizing Provider  acetaminophen (TYLENOL) 325 MG tablet Take 2 tablets (650 mg total) by mouth every 6 (six) hours. 10/02/19   Nuala Alpha, MD  amLODipine (NORVASC) 10 MG tablet Take 1 tablet (10 mg total) by mouth at bedtime. Patient taking differently: Take 10 mg by mouth every morning. 04/19/21   [name], MD  fluticasone (FLONASE) 50 MCG/ACT nasal spray Place 1 spray into both nostrils daily. 12/11/21   Martyn Ehrich, NP  Incontinence Supply Disposable (INCONTINENCE BRIEF MEDIUM) MISC Wear as needed for incontinence 03/23/20   [name], MD  levothyroxine (SYNTHROID) 150 MCG tablet TAKE 1 TABLET BY MOUTH EVERY DAY 04/04/21   [name], MD  lidocaine (LIDODERM) 5 % Place 1 patch onto the skin daily. Remove & Discard patch within 12 hours or as directed by MD 04/04/22   Iona Coach, MD  Misc. Devices MISC Mepilex silver sulfate-foam bandage 6x6" change dressing weekly 12/15/20   [name], MD  pantoprazole (PROTONIX) 40 MG tablet Take 2 tablets (80 mg total) by mouth daily. 04/03/22 04/03/23  Iona Coach, MD  PROAIR HFA [phone] Base) MCG/ACT inhaler INHALE 2 PUFFS INTO THE LUNGS EVERY 6 HOURS AS NEEDED FOR WHEEZING OR SHORTNESS OF BREATH 08/31/18   [name], MD  rosuvastatin (CRESTOR) 20 MG tablet Take 1 tablet (20 mg total) by mouth daily. Patient taking differently: Take 20 mg by mouth every morning. 12/27/20   [name], MD    Physical Exam: Vitals:   08/13/22 2133 08/14/22 0100 08/14/22 0150  BP: (!) 104/56 (!) 99/53   Pulse: 81 77   Resp: 16 17   Temp: 98.1 F (36.7 C)  98.2 F (36.8 C)  TempSrc: Oral  Oral  SpO2: 98% 97%   Weight:  38.1 kg   Height:  '5\' 2"'$  (1.575 m)    Constitutional: NAD, calm, comfortable, thin elderly female laying in bed Eyes: lids and conjunctivae normal ENMT: Mucous membranes are moist.  Neck: normal, supple, Respiratory: clear to auscultation bilaterally, no wheezing, no crackles. Normal respiratory effort. No accessory muscle use.  Cardiovascular: Regular rate and rhythm, no murmurs / rubs / gallops.  Abdomen: soft, no tenderness, Bowel sounds positive.  Musculoskeletal: no clubbing / cyanosis. S/p bilateral AKA Skin: superficial skin tears to sacral region Neurologic: CN 2-12 grossly intact.  Psychiatric: Normal judgment and insight. Alert and oriented x 3. Normal mood. Data Reviewed:  See  HPI  Assessment and Plan: * Acute renal failure (ARF) ([place]) -unclear etiology. Pt denies NSAIDS use. Unclear of her medication list but no contributory meds based on what is current available on Epic.  -renal ultrasound is pending -UA, urine sodium and urine creatinine pending -keep on continuous IV LR fluid -avoid contrasted study -renally dose all medications  S/P AKA (above knee amputation) ([place]) Wheelchair bound  Thrombocytopenia ([place]) -plt of 62, unclear cause. Follow with repeat in the morning  Elevated troponin -elevated to 70. Suspect due to acute renal failure. No cardiac symptoms and EKG is reassuring.   Anemia Normocytic anemia -Hgb of 7.7 and has downward trend to 9.4. Could be due to worsening renal function but pt reports seeing dark urine and dark stool.  Consider possible GI bleed. Check FOBT.  -continue to follow Hgb  Hyperkalemia -K of 6.5 secondary to acute renal failure -administered dextrose with insulin and Lokelma in ED -follow repeat potassium   Type 2 diabetes mellitus ([place]) -controlled. Last A1C of 6.2 in August.  -hold on SSI with acute renal failure to avoid any hypoglycemia  Hypothyroidism -continue levothyroxine       Advance Care Planning:   Code Status: Full Code   Consults: none  Family Communication: none at bedside  Severity of Illness: The appropriate patient status for this patient is INPATIENT. Inpatient status is judged to be reasonable and necessary in order to provide the required intensity of service to ensure the patient's safety. The patient's presenting symptoms, physical exam findings, and initial radiographic and laboratory data in the context of their chronic comorbidities is felt to place them at high risk for further clinical deterioration. Furthermore, it is not anticipated that the patient will be medically stable for discharge from the hospital within 2 midnights of admission.   * I certify that at the point of  admission it is my clinical judgment that the patient will require inpatient hospital care spanning beyond 2 midnights from the point of admission due to high intensity of service, high risk for further deterioration and high frequency of surveillance required.*  Author: Orene Desanctis, DO 08/14/2022 1:59 AM  For on call review [email or web address].

## 2022-08-14 NOTE — Assessment & Plan Note (Signed)
-  plt of 62, unclear cause. Follow with repeat in the morning

## 2022-08-14 NOTE — Assessment & Plan Note (Signed)
continue levothyroxine

## 2022-08-14 NOTE — ED Notes (Signed)
CRITICAL VALUE STICKER  CRITICAL VALUE: Hgb6.6  RECEIVER (on-site recipient of call):Elliet Goodnow H, [place] NOTIFIED: 08/14/22 [id number]  MD NOTIFIED: Dr. Eliseo Squires  TIME OF NOTIFICATION: 0732  RESPONSE:  DO will review chart and contact primary RN for follow up interventions

## 2022-08-14 NOTE — ED Notes (Signed)
Pt bladder scan volume is over 414m of urine

## 2022-08-14 NOTE — Progress Notes (Signed)
Transition of Care Department [facility]) following patient for high risk of readmission.   Patient from [facility]. Patient admitted for renal failure and hyperkalemia. Patient PMH   HTN, ILD, T2DM, PAD s/p bilateral AKA wheelchair bound.  Transition of Care Department [facility]) has reviewed patient and we will continue to monitor patient advancement through interdisciplinary progression rounds.

## 2022-08-14 NOTE — Progress Notes (Signed)
Patient admitted after midnight, please see H&P.  Here with AKI and now acute blood loss anemia.  Pateint states she has been having dark stools for the last week.  Hgb dropped to < 7.  Will get GI consult.  Transfuse 1 unit PRBC and trend hgb for further transfusion needs. Eulogio Bear DO

## 2022-08-14 NOTE — Assessment & Plan Note (Addendum)
-  elevated to 70. Suspect due to acute renal failure. No cardiac symptoms and EKG is reassuring.

## 2022-08-14 NOTE — Consult Note (Signed)
[facility] Consult: 3:05 PM 08/14/2022  LOS: 0 days    Referring Provider: Dr Eliseo Squires Primary Care Physician:  Janifer Adie, MD Primary Gastroenterologist:  Dr Scarlette Shorts.  Ov in 2018, not seen since.    Reason for Consultation:  upper GI Bleeding   HPI: [name] is a 76 y.o. female.  PMH NIDDM.  Diabetic retinopathy.  Retinal detachment.  Hypothyroidism.  Schizotypal disease.  Rheumatoid arthritis.  Interstitial lung disease.  PVD.  CAD. Surgeries include bilateral AKA's 09/2019.  To address gangrene.  Hospitalized for couple of weeks in August 2023 with aspiration MRSA pneumonia.  Rib fracture and blunt trauma after falling out of a wheelchair.  SLP confirmed she had oropharyngeal dysphagia and recommended dysphagia 1 diet.  Lasix was discontinued in the setting of AKI.  CGE, melena reported but was not seen by GI.  Anemic then with Hgb low 6.9, 9.5 near discharge.    At her only GI office visit/encounter in 05/2017, IDA and dysphagia were addressed.  Hgb then was 8.  She had been prescribed oral iron.  Plan was to pursue EGD with possible esophageal dilatation and colonoscopy.  She never followed up for scheduling of these procedures.  Went from living at home to [facility].  This was to be a short-term stay to address blisters on her backside according to the patient.  She was admitted there 12/22..   At rehab she received antibiotics for pneumonia.  Cough improving.  She is on a chopped diet.  Describes occasional cough, sometimes white/foamy sputum, sometimes dry cough.  This can be associated with swallowing but occurs without p.o. intake as well.  Upper dentures do not fit well and some of the teeth have broken off.  Otherwise she is edentulous.  Stools are always dark in setting of po iron for  many years.  She describes stool as being tarry for about 4 weeks.  Denies nausea, vomiting, abdominal pain.  No unusual bleeding.  No prior blood transfusions.  Lab work obtained at the facility showed hyperkalemia and AKI.  At [facility] hospital:  Pressures hypotensive, as low as 99/53.  No tachycardia, no hypoxia on room air, no fever. Lab derangements of hyponatremia at 131.  Hyperkalemia at 6.6.  AKI at 94/2.8.  GFR 35. Other than low albumin, LFTs normal. Troponin I: 70..  68. Hgb 6.6, this compared with a low of 6.9 in early August 2023 and recent levels ~ 9.5 by mid August. No FOBT testing located in epic.  1 PRBC ordered.    Her primary family contact is [employer], her grandson.  She is a widower.  No alcohol intake. Denies family history of stomach or intestinal problems, GI cancers, anemia.    Past Medical History:  Diagnosis Date   Bilateral lower extremity edema 06/06/2014   Cataracts, bilateral 02/13/2011   Seen on eye exam 02/07/11. F/u in 12 months    Diabetes mellitus age 79   Diabetic ulcer of toe ([place]) 12/12/2015   Hyperlipidemia    Hypertension  Retinal detachment, old, partial    left   Retinopathy due to secondary diabetes mellitus ([place])    L>R, laser 3/07   Schizotypal personality disorder ([place])    Thyroid disease    Weight loss 01/22/2012    Past Surgical History:  Procedure Laterality Date   ABDOMINAL AORTOGRAM W/LOWER EXTREMITY N/A 07/23/2018   Procedure: ABDOMINAL AORTOGRAM W/LOWER EXTREMITY;  Surgeon: Marty Heck, MD;  Location: [facility];  Service: Cardiovascular;  Laterality: N/A;   ABOVE KNEE LEG AMPUTATION Bilateral 09/27/2019   AMPUTATION Bilateral 09/27/2019   Procedure: AMPUTATION ABOVE KNEE;  Surgeon: Angelia Mould, MD;  Location: [facility] OR;  Service: Vascular;  Laterality: Bilateral;   BREAST EXCISIONAL BIOPSY Bilateral    BREAST SURGERY     CATARACT EXTRACTION     left    EYE SURGERY     ROTATOR CUFF REPAIR  1990's    left   Thyroid radiation ablation     for Graves Disease   TRANSTHORACIC ECHOCARDIOGRAM      EF55-65%, nml - 12/14/2004    Prior to Admission medications   Medication Sig Start Date End Date Taking? Authorizing Provider  acetaminophen (TYLENOL) 650 MG CR tablet Take 650 mg by mouth 3 (three) times daily.   Yes [provider]  amLODipine (NORVASC) 10 MG tablet Take 1 tablet (10 mg total) by mouth at bedtime. Patient taking differently: Take 10 mg by mouth every morning. 04/19/21  Yes [name], MD  cefTRIAXone (ROCEPHIN) 1 g injection Inject 1 g into the muscle daily. for 7 days   Yes [provider]  Cholecalciferol (VITAMIN D3 SUPER STRENGTH) 50 MCG (2000 UT) TABS Take 2,000 Units by mouth daily.   Yes [provider]  Dextromethorphan-guaiFENesin (CORICIDIN HBP CONGESTION/COUGH PO) Take 2 capsules by mouth every 6 (six) hours.   Yes [provider]  doxycycline (ADOXA) 100 MG tablet Take 100 mg by mouth 2 (two) times daily. for 5 days   Yes [provider]  ferrous sulfate 325 (65 FE) MG tablet Take 325 mg by mouth daily with breakfast.   Yes [provider]  fluticasone (FLONASE) 50 MCG/ACT nasal spray Place 1 spray into both nostrils daily. 12/11/21  Yes Martyn Ehrich, NP  gabapentin (NEURONTIN) 100 MG capsule Take 100 mg by mouth at bedtime.   Yes [provider]  ipratropium-albuterol (DUONEB) 0.5-2.5 (3) MG/3ML SOLN Take 3 mLs by nebulization in the morning and at bedtime. For 7 days Also given one vial every 6 hours as needed for shortness of breath and wheezing   Yes [provider]  Lactobacillus (ACIDOPHILUS/PECTIN PO) Take 1 capsule by mouth daily. for 10 days   Yes [provider]  levothyroxine (SYNTHROID) 100 MCG tablet Take 100 mcg by mouth daily before breakfast.   Yes [provider]  lidocaine (LIDODERM) 5 % Place 1 patch onto the skin daily. Remove & Discard patch  within 12 hours or as directed by MD 04/04/22  Yes Iona Coach, MD  menthol-cetylpyridinium (CEPACOL) 3 MG lozenge Take 1 lozenge by mouth every 2 (two) hours as needed for sore throat.   Yes [provider]  nystatin (MYCOSTATIN/NYSTOP) powder Apply 1 Application topically 2 (two) times daily. Apply to areas under breasts twice daily to treat yeast   Yes [provider]  ondansetron (ZOFRAN) 4 MG tablet Take 4 mg by mouth every 6 (six) hours as needed for nausea or vomiting.   Yes [provider]  pantoprazole (PROTONIX) 40 MG tablet Take 2 tablets (80 mg total) by mouth daily. Patient taking differently: Take 40 mg by mouth daily. 04/03/22 04/03/23 Yes Iona Coach, MD  polyethylene glycol (MIRALAX / GLYCOLAX) 17 g packet Take 17 g by mouth daily as needed for mild constipation.   Yes [provider]  predniSONE (DELTASONE) 5 MG tablet Take 5 mg by mouth daily with breakfast.   Yes [provider]  PROAIR HFA 108 (90 Base) MCG/ACT inhaler INHALE 2 PUFFS INTO THE LUNGS EVERY 6 HOURS AS NEEDED FOR WHEEZING OR SHORTNESS OF BREATH Patient taking differently: Inhale 2 puffs into the lungs every 6 (six) hours as needed for shortness of breath. 08/31/18  Yes [name], MD  rosuvastatin (CRESTOR) 20 MG tablet Take 1 tablet (20 mg total) by mouth daily. Patient taking differently: Take 20 mg by mouth every morning. 12/27/20  Yes [name], MD  Incontinence Supply Disposable (INCONTINENCE BRIEF MEDIUM) MISC Wear as needed for incontinence 03/23/20   [name], MD  Misc. Devices MISC Mepilex silver sulfate-foam bandage 6x6" change dressing weekly 12/15/20   [name], MD    Scheduled Meds:  [START ON 08/17/2022] pantoprazole  40 mg Intravenous Q12H   Infusions:  lactated ringers 75 mL/hr at 08/14/22 0202   pantoprazole     PRN Meds:    Allergies as of 08/13/2022 - Review Complete 06/10/2022  Allergen Reaction Noted    Feraheme [ferumoxytol] Itching 02/03/2018   Rosiglitazone maleate Other (See Comments) 02/20/2006    Family History  Problem Relation Age of Onset   Heart disease Mother    Hypertension Mother    Heart attack Mother    Pulmonary fibrosis Father    Liver disease Sister        transplant, liver   Colon cancer Brother    Liver cancer Brother    Colon cancer Maternal Grandmother    Diabetes Daughter    Breast cancer Niece     Social History   Socioeconomic History   Marital status: Widowed    Spouse name: Not on file   Number of children: 3   Years of education: 27   Highest education level: Not on file  Occupational History   Occupation: Retired- Immunologist   Tobacco Use   Smoking status: Never    Passive exposure: Never   Smokeless tobacco: Never  Vaping Use   Vaping Use: Never used  Substance and Sexual Activity   Alcohol use: No   Drug use: No   Sexual activity: Not Currently  Other Topics Concern   Not on file  Social History Narrative      Current Social History  06/16/2020   Who lives at home: Lives alone in one level home; grandson rotate and stay with her    Transportation: taxi. [employer] for medical appointments. [employer]    Important Relationships & Pets: "Everybody I meet." No pets 06/16/2020   Work / Education:  Retired/ 12 th grade 03/06/2017   Religious / Personal Beliefs: "I believe in God, [place], and the resurrection." 03/06/2017   Interests / Fun: crafts, yard work, going to [employer] 03/06/2017   Current stressors: furnace and hot water heater is not working ( has not worked in over 2 1/2 years)  Social Determinants of Health   Financial Resource Strain: Not on file  Food Insecurity: Not on file  Transportation Needs: No Transportation Needs (08/14/2022)   PRAPARE - Hydrologist (Medical): No    Lack of  Transportation (Non-Medical): No  Physical Activity: Not on file  Stress: Not on file  Social Connections: Not on file  Intimate Partner Violence: Not on file    REVIEW OF SYSTEMS: Constitutional: Some fatigue and weakness, she does not feel that this is profound. ENT:  No nose bleeds Pulm: Cough as above without significant dyspnea. CV:  No palpitations, no angina. GU:  No hematuria, no frequency GI: Per HPI. Heme: Per HPI. Transfusions: 2 PRBCs in 09/2019, 1 PRBC in 04/07/2022 and 1 unit currently ordered to be transfused. Neuro:  No headaches, no peripheral tingling or numbness no syncope, no seizures. Derm:  No itching, no rash or sores.  Endocrine:  No sweats or chills.  No polyuria or dysuria Immunization: Reviewed Travel:  None    PHYSICAL EXAM: Vital signs in last 24 hours: Vitals:   08/14/22 0700 08/14/22 1000  BP: (!) 104/57 113/65  Pulse: 84 80  Resp: 15 18  Temp:    SpO2: 99%    Wt Readings from Last 3 Encounters:  08/14/22 38.1 kg  06/10/22 38.1 kg  04/03/22 38.4 kg    General: Patient is considerably frail but alert, pleasant and talkative.  She looks chronically ill. Head: No signs of head trauma.  No facial asymmetry or swelling. Eyes: Exophthalmos.  Conjunctiva pale. Ears: Slight hearing loss Nose: No discharge or congestion Mouth: Oropharynx is moist, pink, clear.  Tongue midline.  Upper dentures in place with broken off teeth on the denture plate.  The dentures are loose and makes for slightly difficult to understand speech Neck: No JVD, no masses, no thyromegaly Lungs: Diminished breath sounds throughout.  No cough or labored breathing. Heart: RRR.  No MRG.  S1, S2 present Abdomen: Thin, soft.  Not tender or distended.  Active bowel sounds.  No HSM, masses, bruits, hernias..   Rectal: Slightly decreased rectal tone.  No palpable or visible masses.  Stool is greenish-black and FOBT negative.  Some early skin breakdown, erythema at the  sacrum Musc/Skeltl: Bilateral AKA's.  Rheumatoid deformities of the hands/fingers.  Spinal kyphosis Extremities: No edema Neurologic: Oriented x 3.  Moves all 4 limbs without tremor, strength not tested. Skin: A little bit of erythema and early skin breakdown at the sacrum.  No rash Nodes: No cervical adenopathy Psych: Calm, pleasant, cooperative.  Intake/Output from previous day: 12/26 0701 - 12/27 0700 In: 1000 [IV Piggyback:1000] Out: -  Intake/Output this shift: No intake/output data recorded.  LAB RESULTS: Recent Labs    08/13/22 2215 08/13/22 2221 08/14/22 0556  WBC 7.4  --  6.2  HGB 7.7* 8.2* 6.6*  HCT 25.1* 24.0* 22.9*  PLT 62*  --  254   BMET Lab Results  Component Value Date   NA 131 (L) 08/14/2022   NA 133 (L) 08/13/2022   NA 134 (L) 08/13/2022   K 5.6 (H) 08/14/2022   K 6.6 (HH) 08/13/2022   K 6.5 (HH) 08/13/2022   CL 108 08/14/2022   CL 110 08/13/2022   CL 109 08/13/2022   CO2 15 (L) 08/14/2022   CO2 15 (L) 08/13/2022   CO2 24 04/03/2022   GLUCOSE 171 (H) 08/14/2022   GLUCOSE 143 (H) 08/13/2022   GLUCOSE 144 (H) 08/13/2022  BUN 86 (H) 08/14/2022   BUN 107 (H) 08/13/2022   BUN 94 (H) 08/13/2022   CREATININE 2.51 (H) 08/14/2022   CREATININE 3.00 (H) 08/13/2022   CREATININE 2.89 (H) 08/13/2022   CALCIUM 7.8 (L) 08/14/2022   CALCIUM 8.2 (L) 08/13/2022   CALCIUM 8.7 (L) 04/03/2022   LFT Recent Labs    08/13/22 2215  PROT 6.7  ALBUMIN 2.0*  AST 15  ALT 5  ALKPHOS 51  BILITOT 1.1  BILIDIR 0.2  IBILI 0.9   PT/INR No results found for: "INR", "PROTIME" Hepatitis Panel No results for input(s): "HEPBSAG", "HCVAB", "HEPAIGM", "HEPBIGM" in the last 72 hours. C-Diff No components found for: "CDIFF" Lipase  No results found for: "LIPASE"  Drugs of Abuse  No results found for: "LABOPIA", "COCAINSCRNUR", "LABBENZ", "AMPHETMU", "THCU", "LABBARB"   RADIOLOGY STUDIES: US RENAL  Result Date: 08/14/2022 CLINICAL DATA:  [id number] with acute renal  failure. EXAM: RENAL / URINARY TRACT ULTRASOUND COMPLETE COMPARISON:  Renal ultrasound 06/18/2017. FINDINGS: Right Kidney: Renal measurements: 10.0 x 4.0 x 4.0 cm = volume: 85 mL. Echogenicity slightly increased relative to the liver, new in the interval. No mass or hydronephrosis visualized. There is an 8 mm nonobstructive caliceal stone in the mid to lower pole of this kidney which was also not seen previously. Left Kidney: Renal measurements: 8.5 x 4.0 x 3.8 cm = volume: 67 mL. Echogenicity slightly increased relative to liver, new in the interval. No mass or hydronephrosis visualized. There are shadowing nonobstructive caliceal stones measuring 10 mm and 5 mm. Bladder: Appears normal thickness for degree of bladder distention. Bilateral ureteral jets are confirmed. Small amount of echogenic layering debris in the left posterior bladder. The patient unable to void for postvoid residual analysis with prevoid bladder volume 261.7 mL. Other: None. IMPRESSION: 1. No hydronephrosis. 2. Bilateral nonobstructive caliceal stones. 3. Slightly increased echogenicity of the kidneys suggests medical renal disease. 4. Small amount of layering debris in the left posterior bladder. Correlate with urinalysis findings. 5. The patient unable to void for postvoid residual analysis. Electronically Signed   By: Telford Nab M.D.   On: 08/14/2022 02:14   DG Chest Port 1 View  Result Date: 08/13/2022 CLINICAL DATA:  Cough EXAM: PORTABLE CHEST 1 VIEW COMPARISON:  08/07/2022 FINDINGS: Cardiac shadow is stable. Aortic calcifications are again seen. Persistent basilar infiltrates are noted stable from the prior exam. No new focal abnormality is noted. No bony abnormality is seen. IMPRESSION: Stable bibasilar airspace opacities. Electronically Signed   By: Inez Catalina M.D.   On: 08/13/2022 22:20      IMPRESSION:   [place] anemia.  Suspect this is anemia of chronic disease and may well have an element of iron deficiency but pt  taking oral iron for some years.  FOBT negative on DRE for me this afternoon.  Patient describes chronically dark, blackish stools, confirmed this color at DRE today.  She also describes 4 weeks of tarry stools, not seen on DRE.  Overall debilitated appearance in patient with rheumatoid arthritis, bil AKA.  Chonic low dose Prednisone '5mg'$  daily.    Thrombocytopenia yesterday with platelets 62, this appears to be a one-off as platelets in August and recheck today are in the 200s, 300s.  AKI.  Baseline CKD.  GFR slightly improved from 16 to 19 over last 24 hours.  Renal ultrasound shows bilateral nonobstructing stones, increased renal echo consistent with medical renal disease, small degree debris in the bladder.    Hyperkalemia.  Received Lokelma doses x  2.  Interstitial lung disease.    Dysphagia.  As of this summer was limited to a dysphagia 1 diet but currently tolerates chopped diet.   PLAN:     Before patient receives the ordered PRBC, needs lab work collected for anemia profile and Epo level.  Check TSH as well.  Patient quite frail and has hx of aspiration pneumonia in August and possible recent aspiration related pneumonia with antibiotics within the last few days.  Not sure she is a candidate for colonoscopy.  Could probably tolerate EGD.  Current orders are for a full liquid diet.  Does not need Protonix drip or IV Protonix given that she is FOBT negative.  Ordered Protonix 40 mg daily to start tomorrow.   Azucena Freed  08/14/2022, 3:05 PM Phone [phone]

## 2022-08-14 NOTE — Assessment & Plan Note (Signed)
-  controlled. Last A1C of 6.2 in August.  -hold on SSI with acute renal failure to avoid any hypoglycemia

## 2022-08-15 ENCOUNTER — Other Ambulatory Visit: Payer: Self-pay

## 2022-08-15 ENCOUNTER — Inpatient Hospital Stay (HOSPITAL_COMMUNITY): Payer: Medicare (Managed Care)

## 2022-08-15 DIAGNOSIS — N179 Acute kidney failure, unspecified: Secondary | ICD-10-CM | POA: Diagnosis not present

## 2022-08-15 DIAGNOSIS — R109 Unspecified abdominal pain: Secondary | ICD-10-CM | POA: Diagnosis not present

## 2022-08-15 DIAGNOSIS — E875 Hyperkalemia: Secondary | ICD-10-CM | POA: Diagnosis not present

## 2022-08-15 DIAGNOSIS — D649 Anemia, unspecified: Secondary | ICD-10-CM | POA: Diagnosis not present

## 2022-08-15 LAB — BASIC METABOLIC PANEL
Anion gap: 4 — ABNORMAL LOW (ref 5–15)
BUN: 72 mg/dL — ABNORMAL HIGH (ref 8–23)
CO2: 17 mmol/L — ABNORMAL LOW (ref 22–32)
Calcium: 8.1 mg/dL — ABNORMAL LOW (ref 8.9–10.3)
Chloride: 113 mmol/L — ABNORMAL HIGH (ref 98–111)
Creatinine, Ser: 2.09 mg/dL — ABNORMAL HIGH (ref 0.44–1.00)
GFR, Estimated: 24 mL/min — ABNORMAL LOW (ref >60)
Glucose, Bld: 91 mg/dL (ref 70–99)
Potassium: 5.4 mmol/L — ABNORMAL HIGH (ref 3.5–5.1)
Sodium: 134 mmol/L — ABNORMAL LOW (ref 135–145)

## 2022-08-15 LAB — CBC
HCT: 26.3 % — ABNORMAL LOW (ref 36.0–46.0)
Hemoglobin: 7.6 g/dL — ABNORMAL LOW (ref 12.0–15.0)
MCH: 27.6 pg (ref 26.0–34.0)
MCHC: 28.9 g/dL — ABNORMAL LOW (ref 30.0–36.0)
MCV: 95.6 fL (ref 80.0–100.0)
Platelets: 233 10*3/uL (ref 150–400)
RBC: 2.75 MIL/uL — ABNORMAL LOW (ref 3.87–5.11)
RDW: 14.1 % (ref 11.5–15.5)
WBC: 5.8 10*3/uL (ref 4.0–10.5)
nRBC: 0 % (ref 0.0–0.2)

## 2022-08-15 LAB — ERYTHROPOIETIN: Erythropoietin: 8.7 m[IU]/mL (ref 2.6–18.5)

## 2022-08-15 MED ORDER — SODIUM CHLORIDE 0.9 % IV SOLN: INTRAVENOUS | Status: DC

## 2022-08-15 MED ORDER — SODIUM ZIRCONIUM CYCLOSILICATE 10 G PO PACK
10.0000 g | PACK | Freq: Once | ORAL | Status: AC
  Administered 2022-08-15: 10 g via ORAL
  Filled 2022-08-15: qty 1

## 2022-08-15 MED ORDER — FOLIC ACID 1 MG PO TABS
1.0000 mg | ORAL_TABLET | Freq: Every day | ORAL | Status: DC
  Administered 2022-08-15 – 2022-08-19 (×4): 1 mg via ORAL
  Filled 2022-08-15 (×4): qty 1

## 2022-08-15 NOTE — Progress Notes (Addendum)
[facility] Progress Note  CC: Anemia, dark stools  Subjective: She is hungry she remains on clear liquids.  She complains of right lower abdominal pain which started yesterday.  No nausea or vomiting.  No chest pain or shortness of breath.  She passed a green bowel movement yesterday.  No BM yet today.  Objective:  Vital signs in last 24 hours: Temp:  [98.4 F (36.9 C)] 98.4 F (36.9 C) (12/27 1900) Pulse Rate:  [80] 80 (12/27 1900) Resp:  [14-22] 18 (12/28 0900) BP: (107-117)/(55-71) 117/55 (12/28 0800) SpO2:  [97 %] 97 % (12/27 1900) Last BM Date : 08/14/22 General: Alert 76 year old female in no acute distress. Heart: Regular rate and rhythm, no murmurs. Pulm: Breath sounds clear with few crackles in the right lower base. Abdomen: Soft, nondistended.  Generalized tenderness with light tactile pressure, moderate tenderness below the umbilicus and to the RLQ area without rebound or guarding.  Positive bowel sounds to all 4 quadrants. Extremities:  bilateral AKA. Neurologic:  Alert and  oriented x 4.  Speech is clear.  Moves all extremities. Psych:  Alert and cooperative. Normal mood and affect.  Intake/Output from previous day: 12/27 0701 - 12/28 0700 In: -  Out: 600 [Urine:600] Intake/Output this shift: No intake/output data recorded.  Lab Results: Recent Labs    08/13/22 2215 08/13/22 2221 08/14/22 0556 08/14/22 1607 08/15/22 0346  WBC 7.4  --  6.2  --  5.8  HGB 7.7*   < > 6.6* 7.2* 7.6*  HCT 25.1*   < > 22.9* 24.3* 26.3*  PLT 62*  --  254  --  233   < > = values in this interval not displayed.   BMET Recent Labs    08/14/22 0556 08/14/22 1607 08/15/22 0346  NA 131* 133* 134*  K 5.6* 5.2* 5.4*  CL 108 110 113*  CO2 15* 16* 17*  GLUCOSE 171* 126* 91  BUN 86* 78* 72*  CREATININE 2.51* 2.36* 2.09*  CALCIUM 7.8* 7.8* 8.1*   LFT Recent Labs    08/13/22 2215  PROT 6.7  ALBUMIN 2.0*  AST 15  ALT 5  ALKPHOS 51  BILITOT 1.1   BILIDIR 0.2  IBILI 0.9   PT/INR No results for input(s): "LABPROT", "INR" in the last 72 hours. Hepatitis Panel No results for input(s): "HEPBSAG", "HCVAB", "HEPAIGM", "HEPBIGM" in the last 72 hours.  US RENAL  Result Date: 08/14/2022 CLINICAL DATA:  [id number] with acute renal failure. EXAM: RENAL / URINARY TRACT ULTRASOUND COMPLETE COMPARISON:  Renal ultrasound 06/18/2017. FINDINGS: Right Kidney: Renal measurements: 10.0 x 4.0 x 4.0 cm = volume: 85 mL. Echogenicity slightly increased relative to the liver, new in the interval. No mass or hydronephrosis visualized. There is an 8 mm nonobstructive caliceal stone in the mid to lower pole of this kidney which was also not seen previously. Left Kidney: Renal measurements: 8.5 x 4.0 x 3.8 cm = volume: 67 mL. Echogenicity slightly increased relative to liver, new in the interval. No mass or hydronephrosis visualized. There are shadowing nonobstructive caliceal stones measuring 10 mm and 5 mm. Bladder: Appears normal thickness for degree of bladder distention. Bilateral ureteral jets are confirmed. Small amount of echogenic layering debris in the left posterior bladder. The patient unable to void for postvoid residual analysis with prevoid bladder volume 261.7 mL. Other: None. IMPRESSION: 1. No hydronephrosis. 2. Bilateral nonobstructive caliceal stones. 3. Slightly increased echogenicity of the kidneys suggests medical renal disease. 4. Small amount of layering  debris in the left posterior bladder. Correlate with urinalysis findings. 5. The patient unable to void for postvoid residual analysis. Electronically Signed   By: Telford Nab M.D.   On: 08/14/2022 02:14   DG Chest Port 1 View  Result Date: 08/13/2022 CLINICAL DATA:  Cough EXAM: PORTABLE CHEST 1 VIEW COMPARISON:  08/07/2022 FINDINGS: Cardiac shadow is stable. Aortic calcifications are again seen. Persistent basilar infiltrates are noted stable from the prior exam. No new focal abnormality is  noted. No bony abnormality is seen. IMPRESSION: Stable bibasilar airspace opacities. Electronically Signed   By: Inez Catalina M.D.   On: 08/13/2022 22:20    Assessment / Plan:  73) 76 year old female admitted to the hospital 08/13/2022 with AKI on CKD with hyperkalemia. Cr 2.89 -> 2.09. K+ 6.5 -> 5.4. Renal ultrasound showed bilateral nonobstructing stones, increased renal echo consistent with medical renal disease, small degree debris in the bladder.   Received Lokelma x 2 doses. -Management per the medical service  2) Normocytic anemia. On oral iron at home. Admission Hg 7.7 -> 8.2 -> 6.6 -> transfused 1 unit of PRBCs 12/27 -> post transfusion Hg 7.2 -> today Hg 7.6.  Iron 53.  Ferritin 1330.  B12 level 1036.  Folate 5.5.  Erythropoietin 8.7.  Absolute retic count 14.9.  TSH 3.078.  FOBT negative.  No overt GI bleeding. -Defer endoscopic recommendations to Dr. Lorenso Courier -Continue Pantoprazole '40mg'$  p.o. daily for now -Full liquid diet  3) RLQ pain -CTAP with oral contrast  4) DM II  5) CAD  6) PVD s/p bilateral AKA    Principal Problem:   Acute renal failure (ARF) (HCC) Active Problems:   Hypothyroidism   Type 2 diabetes mellitus (HCC)   Hyperkalemia   Anemia   Elevated troponin   Thrombocytopenia (HCC)   S/P AKA (above knee amputation) ([place])     LOS: 1 day   Noralyn Pick  08/15/2022, 9:44 AM

## 2022-08-15 NOTE — Progress Notes (Addendum)
Spoke with Md Eliseo Squires via secure chat to assess whether the patient could laterally transfer to [facility]W in order to open progressive care beds. Md Eliseo Squires gave consent for the transfer.

## 2022-08-15 NOTE — Plan of Care (Signed)

## 2022-08-15 NOTE — Progress Notes (Addendum)
PROGRESS NOTE    [name]  OYD:[id number] DOB: [birth year] DOA: 08/13/2022 PCP: Janifer Adie, MD    Brief Narrative:  [name] is a 76 y.o. female with medical history significant of HTN, ILD, T2DM, PAD s/p bilateral AKA wheelchair bound who was sent from SNF for abnormal labs  with acute renal failure and hyperkalemia.    She is asymptomatic otherwise. Has been eating and drinking well. No vomiting or diarrhea. Feels urine output has been normal although has noticed recently that her urine and stool are dark. Thinks she was just put on iron supplementation. No abdominal or flank pain. Denies NSAIDS although unsure what medications they give her at [facility].    Assessment and Plan: * Acute renal failure (ARF) ([place]) -unclear etiology. Pt denies NSAIDS use. Unclear of her medication list but no contributory meds based on what is current available on Epic.  -renal ultrasound: medical renal disease -keep on continuous IV LR fluid -avoid contrasted study -renally dose all medications  Recent PNA -continue rocephin -- started outpatient  S/P AKA (above knee amputation) ([place]) Wheelchair bound  Thrombocytopenia ([place]) -resolved  Elevated troponin -elevated to 70. Suspect due to acute renal failure. No cardiac symptoms and EKG is reassuring.   Anemia Normocytic anemia - 1 unit PRBC ordered but never given -concern for bleeding -GI consult -CT abd/pelvis pending -folate low- replace  Hyperkalemia -K of 6.5 secondary to acute renal failure -lokelma  Type 2 diabetes mellitus ([place]) -controlled. Last A1C of 6.2 in August.  -hold on SSI with acute renal failure to avoid any hypoglycemia  Hypothyroidism -continue levothyroxine       DVT prophylaxis: scd    Code Status: Full Code   Disposition Plan:  Level of care: Med-Surg Status is: Inpatient Remains inpatient appropriate because: needs further work up for bleeding    Consultants:   GI   Subjective: Thinks he abdominal pain was from not eating  Objective: Vitals:   08/14/22 1900 08/15/22 0700 08/15/22 0800 08/15/22 0900  BP: 107/71  (!) 117/55   Pulse: 80     Resp: 14 (!) '22 14 18  '$ Temp: 98.4 F (36.9 C)  98 F (36.7 C)   TempSrc: Oral  Oral   SpO2: 97%  100%   Weight:      Height:        Intake/Output Summary (Last 24 hours) at 08/15/2022 1133 Last data filed at 08/14/2022 2000 Gross per 24 hour  Intake --  Output 600 ml  Net -600 ml   Filed Weights   08/14/22 0100  Weight: 38.1 kg    Examination:   General: Appearance:    Thin female in no acute distress     Lungs:     C respirations unlabored  Heart:    Normal heart rate. Normal rhythm. No murmurs, rubs, or gallops.    MS:   B/l amputation   Neurologic:   Awake, alert       Data Reviewed: I have personally reviewed following labs and imaging studies  CBC: Recent Labs  Lab 08/13/22 2215 08/13/22 2221 08/14/22 0556 08/14/22 1607 08/15/22 0346  WBC 7.4  --  6.2  --  5.8  NEUTROABS 5.4  --   --   --   --   HGB 7.7* 8.2* 6.6* 7.2* 7.6*  HCT 25.1* 24.0* 22.9* 24.3* 26.3*  MCV 96.2  --  98.3  --  95.6  PLT 62*  --  254  --  202   Basic Metabolic Panel: Recent Labs  Lab 08/13/22 2215 08/13/22 2221 08/14/22 0556 08/14/22 1607 08/15/22 0346  NA 134* 133* 131* 133* 134*  K 6.5* 6.6* 5.6* 5.2* 5.4*  CL 109 110 108 110 113*  CO2 15*  --  15* 16* 17*  GLUCOSE 144* 143* 171* 126* 91  BUN 94* 107* 86* 78* 72*  CREATININE 2.89* 3.00* 2.51* 2.36* 2.09*  CALCIUM 8.2*  --  7.8* 7.8* 8.1*  MG 1.9  --   --   --   --    GFR: Estimated Creatinine Clearance: 13.8 mL/min (A) (by C-G formula based on SCr of 2.09 mg/dL (H)). Liver Function Tests: Recent Labs  Lab 08/13/22 2215  AST 15  ALT 5  ALKPHOS 51  BILITOT 1.1  PROT 6.7  ALBUMIN 2.0*   No results for input(s): "LIPASE", "AMYLASE" in the last 168 hours. No results for input(s): "AMMONIA" in the last 168  hours. Coagulation Profile: No results for input(s): "INR", "PROTIME" in the last 168 hours. Cardiac Enzymes: No results for input(s): "CKTOTAL", "CKMB", "CKMBINDEX", "TROPONINI" in the last 168 hours. BNP (last 3 results) No results for input(s): "PROBNP" in the last 8760 hours. HbA1C: No results for input(s): "HGBA1C" in the last 72 hours. CBG: Recent Labs  Lab 08/13/22 2131 08/13/22 2340  GLUCAP 138* 254*   Lipid Profile: No results for input(s): "CHOL", "HDL", "LDLCALC", "TRIG", "CHOLHDL", "LDLDIRECT" in the last 72 hours. Thyroid Function Tests: Recent Labs    08/14/22 1607  TSH 3.078   Anemia Panel: Recent Labs    08/14/22 1607  VITAMINB12 1,036*  FOLATE 5.5*  FERRITIN 1,330*  TIBC UNABLE TO CALCULATE  IRON 53  RETICCTPCT 0.6   Sepsis Labs: No results for input(s): "PROCALCITON", "LATICACIDVEN" in the last 168 hours.  No results found for this or any previous visit (from the past 240 hour(s)).       Radiology Studies: US RENAL  Result Date: 08/14/2022 CLINICAL DATA:  [id number] with acute renal failure. EXAM: RENAL / URINARY TRACT ULTRASOUND COMPLETE COMPARISON:  Renal ultrasound 06/18/2017. FINDINGS: Right Kidney: Renal measurements: 10.0 x 4.0 x 4.0 cm = volume: 85 mL. Echogenicity slightly increased relative to the liver, new in the interval. No mass or hydronephrosis visualized. There is an 8 mm nonobstructive caliceal stone in the mid to lower pole of this kidney which was also not seen previously. Left Kidney: Renal measurements: 8.5 x 4.0 x 3.8 cm = volume: 67 mL. Echogenicity slightly increased relative to liver, new in the interval. No mass or hydronephrosis visualized. There are shadowing nonobstructive caliceal stones measuring 10 mm and 5 mm. Bladder: Appears normal thickness for degree of bladder distention. Bilateral ureteral jets are confirmed. Small amount of echogenic layering debris in the left posterior bladder. The patient unable to void for  postvoid residual analysis with prevoid bladder volume 261.7 mL. Other: None. IMPRESSION: 1. No hydronephrosis. 2. Bilateral nonobstructive caliceal stones. 3. Slightly increased echogenicity of the kidneys suggests medical renal disease. 4. Small amount of layering debris in the left posterior bladder. Correlate with urinalysis findings. 5. The patient unable to void for postvoid residual analysis. Electronically Signed   By: Telford Nab M.D.   On: 08/14/2022 02:14   DG Chest Port 1 View  Result Date: 08/13/2022 CLINICAL DATA:  Cough EXAM: PORTABLE CHEST 1 VIEW COMPARISON:  08/07/2022 FINDINGS: Cardiac shadow is stable. Aortic calcifications are again seen. Persistent basilar infiltrates are noted stable from the prior exam. No new  focal abnormality is noted. No bony abnormality is seen. IMPRESSION: Stable bibasilar airspace opacities. Electronically Signed   By: Inez Catalina M.D.   On: 08/13/2022 22:20        Scheduled Meds:  folic acid  1 mg Oral Daily   levothyroxine  100 mcg Oral QAC breakfast   pantoprazole  40 mg Oral Daily   predniSONE  5 mg Oral Q breakfast   Continuous Infusions:  sodium chloride 50 mL/hr at 08/15/22 1116   cefTRIAXone (ROCEPHIN)  IV 1 g (08/14/22 1812)     LOS: 1 day    Time spent: 45 minutes spent on chart review, discussion with nursing staff, consultants, updating family and interview/physical exam; more than 50% of that time was spent in counseling and/or coordination of care.    Geradine Girt, DO [facility] Available via Epic secure chat 7am-7pm After these hours, please refer to coverage provider listed on [email or web address] 08/15/2022, 11:33 AM

## 2022-08-16 DIAGNOSIS — N179 Acute kidney failure, unspecified: Secondary | ICD-10-CM | POA: Diagnosis not present

## 2022-08-16 DIAGNOSIS — K6389 Other specified diseases of intestine: Secondary | ICD-10-CM

## 2022-08-16 DIAGNOSIS — D649 Anemia, unspecified: Secondary | ICD-10-CM | POA: Diagnosis not present

## 2022-08-16 DIAGNOSIS — R935 Abnormal findings on diagnostic imaging of other abdominal regions, including retroperitoneum: Secondary | ICD-10-CM

## 2022-08-16 DIAGNOSIS — E875 Hyperkalemia: Secondary | ICD-10-CM | POA: Diagnosis not present

## 2022-08-16 LAB — CBC
HCT: 24.4 % — ABNORMAL LOW (ref 36.0–46.0)
Hemoglobin: 7.6 g/dL — ABNORMAL LOW (ref 12.0–15.0)
MCH: 28.9 pg (ref 26.0–34.0)
MCHC: 31.1 g/dL (ref 30.0–36.0)
MCV: 92.8 fL (ref 80.0–100.0)
Platelets: 192 10*3/uL (ref 150–400)
RBC: 2.63 MIL/uL — ABNORMAL LOW (ref 3.87–5.11)
RDW: 13.8 % (ref 11.5–15.5)
WBC: 5.7 10*3/uL (ref 4.0–10.5)
nRBC: 0 % (ref 0.0–0.2)

## 2022-08-16 LAB — BASIC METABOLIC PANEL
Anion gap: 4 — ABNORMAL LOW (ref 5–15)
BUN: 55 mg/dL — ABNORMAL HIGH (ref 8–23)
CO2: 17 mmol/L — ABNORMAL LOW (ref 22–32)
Calcium: 8.1 mg/dL — ABNORMAL LOW (ref 8.9–10.3)
Chloride: 115 mmol/L — ABNORMAL HIGH (ref 98–111)
Creatinine, Ser: 1.62 mg/dL — ABNORMAL HIGH (ref 0.44–1.00)
GFR, Estimated: 33 mL/min — ABNORMAL LOW (ref >60)
Glucose, Bld: 80 mg/dL (ref 70–99)
Potassium: 5.2 mmol/L — ABNORMAL HIGH (ref 3.5–5.1)
Sodium: 136 mmol/L (ref 135–145)

## 2022-08-16 LAB — MRSA NEXT GEN BY PCR, NASAL: MRSA by PCR Next Gen: DETECTED — AB

## 2022-08-16 MED ORDER — BISACODYL 5 MG PO TBEC: 10.0000 mg | DELAYED_RELEASE_TABLET | Freq: Once | ORAL | Status: DC

## 2022-08-16 MED ORDER — SODIUM CHLORIDE 0.9 % IV SOLN: INTRAVENOUS | Status: DC

## 2022-08-16 MED ORDER — PEG-KCL-NACL-NASULF-NA ASC-C 100 G PO SOLR: 1.0000 | Freq: Once | ORAL | Status: DC

## 2022-08-16 MED ORDER — SODIUM ZIRCONIUM CYCLOSILICATE 10 G PO PACK
10.0000 g | PACK | Freq: Once | ORAL | Status: AC
  Administered 2022-08-16: 10 g via ORAL
  Filled 2022-08-16: qty 1

## 2022-08-16 MED ORDER — PEG-KCL-NACL-NASULF-NA ASC-C 100 G PO SOLR
0.5000 | Freq: Once | ORAL | Status: AC
  Administered 2022-08-16: 100 g via ORAL
  Filled 2022-08-16: qty 1

## 2022-08-16 MED ORDER — BISACODYL 5 MG PO TBEC
10.0000 mg | DELAYED_RELEASE_TABLET | Freq: Once | ORAL | Status: AC
  Administered 2022-08-16: 10 mg via ORAL
  Filled 2022-08-16: qty 2

## 2022-08-16 MED ORDER — PEG-KCL-NACL-NASULF-NA ASC-C 100 G PO SOLR: 0.5000 | Freq: Once | ORAL | Status: DC

## 2022-08-16 NOTE — Progress Notes (Signed)
PROGRESS NOTE    [name]  SMO:[id number] DOB: October 25, 1945 DOA: 08/13/2022 PCP: Janifer Adie, MD    Brief Narrative:  [name] is a 76 y.o. female with medical history significant of HTN, ILD, T2DM, PAD s/p bilateral AKA wheelchair bound who was sent from SNF for abnormal labs  with acute renal failure and hyperkalemia.    She is asymptomatic otherwise. Has been eating and drinking well. No vomiting or diarrhea. Feels urine output has been normal although has noticed recently that her urine and stool are dark. Thinks she was just put on iron supplementation. No abdominal or flank pain. Denies NSAIDS although unsure what medications they give her at [facility].  CT scan shows ? Mass in colon.  Will need EGD/colonoscopy, planned for the AM   Assessment and Plan: * Acute renal failure (ARF) ([place]) -unclear etiology. Pt denies NSAIDS use. Unclear of her medication list but no contributory meds based on what is current available on Epic.  -renal ultrasound: medical renal disease -keep on continuous IV LR fluid -avoid contrasted studies -renally dose all medications  Recent PNA -continue rocephin -- started outpatient -confirmed on CT scan  S/P AKA (above knee amputation) ([facility]) Wheelchair bound  Thrombocytopenia ([place]) -resolved  Elevated troponin -elevated to 70. Suspect due to acute renal failure. No cardiac symptoms and EKG is reassuring.   Anemia Normocytic anemia - 1 unit PRBC ordered but never given -hgb stable -GI consult- see above -CT abd/pelvis - see above -folate low- replace  Hyperkalemia -K of 6.5 secondary to acute renal failure -lokelma daily  Type 2 diabetes mellitus ([place]) -controlled. Last A1C of 6.2 in August.  -hold on SSI with acute renal failure to avoid any hypoglycemia  Hypothyroidism -continue levothyroxine       DVT prophylaxis: scd    Code Status: Full Code   Disposition Plan:  Level of care: Med-Surg Status is:  Inpatient Remains inpatient appropriate because: needs further work up for bleeding    Consultants:  GI   Subjective: Hungry for lunch  Objective: Vitals:   08/15/22 2129 08/16/22 0414 08/16/22 0503 08/16/22 0816  BP: 127/79 115/74 111/66 113/68  Pulse: 75 74 78 86  Resp: _0 Temp: 98 F (36.7 C) 98 F (36.7 C) 97.9 F (36.6 C) 98.4 F (36.9 C)  TempSrc: Oral Oral  Oral  SpO2: 98% 99% 99% 100%  Weight:      Height:        Intake/Output Summary (Last 24 hours) at 08/16/2022 1218 Last data filed at 08/15/2022 1800 Gross per 24 hour  Intake --  Output 1 ml  Net -1 ml   Filed Weights   08/14/22 0100  Weight: 38.1 kg    Examination:   General: Appearance:    Thin female in no acute distress     Lungs:     respirations unlabored  Heart:    Normal heart rate. Normal rhythm. No murmurs, rubs, or gallops.    MS:   B/l amputation LE   Neurologic:   Awake, alert. Pleasant and cooperative       Data Reviewed: I have personally reviewed following labs and imaging studies  CBC: Recent Labs  Lab 08/13/22 2215 08/13/22 2221 08/14/22 0556 08/14/22 1607 08/15/22 0346 08/16/22 0406  WBC 7.4  --  6.2  --  5.8 5.7  NEUTROABS 5.4  --   --   --   --   --   HGB 7.7*  8.2* 6.6* 7.2* 7.6* 7.6*  HCT 25.1* 24.0* 22.9* 24.3* 26.3* 24.4*  MCV 96.2  --  98.3  --  95.6 92.8  PLT 62*  --  254  --  233 701   Basic Metabolic Panel: Recent Labs  Lab 08/13/22 2215 08/13/22 2221 08/14/22 0556 08/14/22 1607 08/15/22 0346 08/16/22 0406  NA 134* 133* 131* 133* 134* 136  K 6.5* 6.6* 5.6* 5.2* 5.4* 5.2*  CL 109 110 108 110 113* 115*  CO2 15*  --  15* 16* 17* 17*  GLUCOSE 144* 143* 171* 126* 91 80  BUN 94* 107* 86* 78* 72* 55*  CREATININE 2.89* 3.00* 2.51* 2.36* 2.09* 1.62*  CALCIUM 8.2*  --  7.8* 7.8* 8.1* 8.1*  MG 1.9  --   --   --   --   --    GFR: Estimated Creatinine Clearance: 17.8 mL/min (A) (by C-G formula based on SCr of 1.62 mg/dL (H)). Liver  Function Tests: Recent Labs  Lab 08/13/22 2215  AST 15  ALT 5  ALKPHOS 51  BILITOT 1.1  PROT 6.7  ALBUMIN 2.0*   No results for input(s): "LIPASE", "AMYLASE" in the last 168 hours. No results for input(s): "AMMONIA" in the last 168 hours. Coagulation Profile: No results for input(s): "INR", "PROTIME" in the last 168 hours. Cardiac Enzymes: No results for input(s): "CKTOTAL", "CKMB", "CKMBINDEX", "TROPONINI" in the last 168 hours. BNP (last 3 results) No results for input(s): "PROBNP" in the last 8760 hours. HbA1C: No results for input(s): "HGBA1C" in the last 72 hours. CBG: Recent Labs  Lab 08/13/22 2131 08/13/22 2340  GLUCAP 138* 254*   Lipid Profile: No results for input(s): "CHOL", "HDL", "LDLCALC", "TRIG", "CHOLHDL", "LDLDIRECT" in the last 72 hours. Thyroid Function Tests: Recent Labs    08/14/22 1607  TSH 3.078   Anemia Panel: Recent Labs    08/14/22 1607  VITAMINB12 1,036*  FOLATE 5.5*  FERRITIN 1,330*  TIBC UNABLE TO CALCULATE  IRON 53  RETICCTPCT 0.6   Sepsis Labs: No results for input(s): "PROCALCITON", "LATICACIDVEN" in the last 168 hours.  No results found for this or any previous visit (from the past 240 hour(s)).       Radiology Studies: CT ABDOMEN PELVIS WO CONTRAST  Result Date: 08/15/2022 CLINICAL DATA:  RIGHT LOWER QUADRANT abdominal pain. EXAM: CT ABDOMEN AND PELVIS WITHOUT CONTRAST TECHNIQUE: Multidetector CT imaging of the abdomen and pelvis was performed following the standard protocol without IV contrast. RADIATION DOSE REDUCTION: This exam was performed according to the departmental dose-optimization program which includes automated exposure control, adjustment of the mA and/or kV according to patient size and/or use of iterative reconstruction technique. COMPARISON:  Renal ultrasound 08/14/2022 FINDINGS: Lower chest: Headache, changes are identified at the lung bases. There is partially imaged consolidation within the posterior  RIGHT LOWER lobe. There is significant atherosclerotic calcification of coronary arteries. Heart size is normal. No pericardial effusion. Hepatobiliary: Prior cholecystectomy. Normal appearance of the liver. Pancreas: Unremarkable. No pancreatic ductal dilatation or surrounding inflammatory changes. Spleen: Normal in size without focal abnormality. Adrenals/Urinary Tract: Adrenal glands are normal. There are bilateral renal artery calcifications. Intrarenal stone in the LOWER pole the RIGHT kidney measures 5 millimeters. There is no hydronephrosis. RIGHT ureter is unremarkable. A 1 millimeter intrarenal calculus is identified in the LOWER pole of the LEFT kidney. No hydronephrosis. LEFT ureter is unremarkable. The urinary bladder is distended and otherwise normal in appearance. Stomach/Bowel: Stomach and small bowel loops are normal in appearance. The appendix  is not well seen. There are scattered colonic diverticula but no associated inflammatory changes. Within the ascending colon, there is a focal area of thickening at a haustral fold, raising the question of circumferential mass. Findings are best seen on coronal and sagittal images (image 76 of series 6, image 42 of series 7). Vascular/Lymphatic: There is extensive atherosclerotic calcification of the abdominal and pelvic arteries. No evidence for abdominal adenopathy. Reproductive: Uterus is present. No adnexal mass. No free pelvic fluid. Other: Patient is cachectic.  Diffuse body wall edema noted. Musculoskeletal: No acute fractures or subluxations. Remote inferior endplate fracture of L4. Remote superior endplate fracture of W99. IMPRESSION: 1. Partially imaged consolidation within the posterior RIGHT LOWER lobe, consistent with pneumonia. Chronic honeycomb changes at the lung bases. 2. Focal area of thickening at a haustral fold in the ascending colon, raising the question of circumferential mass. Recommend further evaluation with colonoscopy. 3. Colonic  diverticulosis without acute diverticulitis. 4. Prior cholecystectomy. 5. Bilateral nonobstructing intrarenal calculi. 6. Diffuse body wall edema. 7. Remote fractures of T12 and L4. These results will be called to the ordering clinician or representative by the Radiologist Assistant, and communication documented in the PACS or [employer]. Electronically Signed   By: Nolon Nations M.D.   On: 08/15/2022 15:22        Scheduled Meds:  bisacodyl  10 mg Oral Once   bisacodyl  10 mg Oral Once   folic acid  1 mg Oral Daily   levothyroxine  100 mcg Oral QAC breakfast   pantoprazole  40 mg Oral Daily   peg 3350 powder  0.5 kit Oral Once   And   peg 3350 powder  0.5 kit Oral Once   predniSONE  5 mg Oral Q breakfast   Continuous Infusions:  sodium chloride 50 mL/hr at 08/16/22 0505   cefTRIAXone (ROCEPHIN)  IV 1 g (08/15/22 1656)     LOS: 2 days    Time spent: 45 minutes spent on chart review, discussion with nursing staff, consultants, updating family and interview/physical exam; more than 50% of that time was spent in counseling and/or coordination of care.    Geradine Girt, DO [facility] Available via Epic secure chat 7am-7pm After these hours, please refer to coverage provider listed on [email or web address] 08/16/2022, 12:18 PM

## 2022-08-16 NOTE — Progress Notes (Signed)
[facility] Progress Note  CC:  Anemia, dark stools    Subjective: Her RLQ pain abated after eating a full liquid diet.  No nausea or vomiting.  She passed a loose black stool earlier this morning.  No chest pain or shortness of breath.  No family at the bedside.   Objective:  Vital signs in last 24 hours: Temp:  [97.9 F (36.6 C)-98.4 F (36.9 C)] 98.4 F (36.9 C) (12/29 0816) Pulse Rate:  [74-86] 86 (12/29 0816) Resp:  [14-16] 16 (12/29 0816) BP: (111-127)/(63-79) 113/68 (12/29 0816) SpO2:  [98 %-100 %] 100 % (12/29 0816) Last BM Date : 08/14/22 General: 76 year old female in no acute distress. Heart: Regular rate and rhythm, no murmurs. Pulm: Breath sounds clear throughout. Abdomen: Soft, nondistended.  Mild RLQ tenderness without rebound or guarding.  Positive bowel sounds to all 4 quadrants. Extremities: Bilateral AKA. Neurologic:  Alert and  oriented x 4. Grossly normal neurologically. Psych:  Alert and cooperative. Normal mood and affect.  Intake/Output from previous day: 12/28 0701 - 12/29 0700 In: -  Out: 1 [Stool:1] Intake/Output this shift: No intake/output data recorded.  Lab Results: Recent Labs    08/14/22 0556 08/14/22 1607 08/15/22 0346 08/16/22 0406  WBC 6.2  --  5.8 5.7  HGB 6.6* 7.2* 7.6* 7.6*  HCT 22.9* 24.3* 26.3* 24.4*  PLT 254  --  233 192   BMET Recent Labs    08/14/22 1607 08/15/22 0346 08/16/22 0406  NA 133* 134* 136  K 5.2* 5.4* 5.2*  CL 110 113* 115*  CO2 16* 17* 17*  GLUCOSE 126* 91 80  BUN 78* 72* 55*  CREATININE 2.36* 2.09* 1.62*  CALCIUM 7.8* 8.1* 8.1*   LFT Recent Labs    08/13/22 2215  PROT 6.7  ALBUMIN 2.0*  AST 15  ALT 5  ALKPHOS 51  BILITOT 1.1  BILIDIR 0.2  IBILI 0.9   PT/INR No results for input(s): "LABPROT", "INR" in the last 72 hours. Hepatitis Panel No results for input(s): "HEPBSAG", "HCVAB", "HEPAIGM", "HEPBIGM" in the last 72 hours.  CT ABDOMEN PELVIS WO CONTRAST  Result  Date: 08/15/2022 CLINICAL DATA:  RIGHT LOWER QUADRANT abdominal pain. EXAM: CT ABDOMEN AND PELVIS WITHOUT CONTRAST TECHNIQUE: Multidetector CT imaging of the abdomen and pelvis was performed following the standard protocol without IV contrast. RADIATION DOSE REDUCTION: This exam was performed according to the departmental dose-optimization program which includes automated exposure control, adjustment of the mA and/or kV according to patient size and/or use of iterative reconstruction technique. COMPARISON:  Renal ultrasound 08/14/2022 FINDINGS: Lower chest: Headache, changes are identified at the lung bases. There is partially imaged consolidation within the posterior RIGHT LOWER lobe. There is significant atherosclerotic calcification of coronary arteries. Heart size is normal. No pericardial effusion. Hepatobiliary: Prior cholecystectomy. Normal appearance of the liver. Pancreas: Unremarkable. No pancreatic ductal dilatation or surrounding inflammatory changes. Spleen: Normal in size without focal abnormality. Adrenals/Urinary Tract: Adrenal glands are normal. There are bilateral renal artery calcifications. Intrarenal stone in the LOWER pole the RIGHT kidney measures 5 millimeters. There is no hydronephrosis. RIGHT ureter is unremarkable. A 1 millimeter intrarenal calculus is identified in the LOWER pole of the LEFT kidney. No hydronephrosis. LEFT ureter is unremarkable. The urinary bladder is distended and otherwise normal in appearance. Stomach/Bowel: Stomach and small bowel loops are normal in appearance. The appendix is not well seen. There are scattered colonic diverticula but no associated inflammatory changes. Within the ascending colon, there is a focal  area of thickening at a haustral fold, raising the question of circumferential mass. Findings are best seen on coronal and sagittal images (image 76 of series 6, image 42 of series 7). Vascular/Lymphatic: There is extensive atherosclerotic calcification  of the abdominal and pelvic arteries. No evidence for abdominal adenopathy. Reproductive: Uterus is present. No adnexal mass. No free pelvic fluid. Other: Patient is cachectic.  Diffuse body wall edema noted. Musculoskeletal: No acute fractures or subluxations. Remote inferior endplate fracture of L4. Remote superior endplate fracture of A76. IMPRESSION: 1. Partially imaged consolidation within the posterior RIGHT LOWER lobe, consistent with pneumonia. Chronic honeycomb changes at the lung bases. 2. Focal area of thickening at a haustral fold in the ascending colon, raising the question of circumferential mass. Recommend further evaluation with colonoscopy. 3. Colonic diverticulosis without acute diverticulitis. 4. Prior cholecystectomy. 5. Bilateral nonobstructing intrarenal calculi. 6. Diffuse body wall edema. 7. Remote fractures of T12 and L4. These results will be called to the ordering clinician or representative by the Radiologist Assistant, and communication documented in the PACS or [employer]. Electronically Signed   By: Nolon Nations M.D.   On: 08/15/2022 15:22    Assessment / Plan:  71) 76 year old female admitted to the hospital 08/13/2022 with AKI on CKD with hyperkalemia. Cr 2.89 -> 2.09 -> 1.62. K+ 6.5 -> 5.4 -> 5.2. Renal ultrasound showed bilateral nonobstructing stones, increased renal echo consistent with medical renal disease, small degree debris in the bladder.   Received Lokelma x 2 doses. -Management per the medical service   2) Normocytic anemia. On oral iron at home. Admission Hg 7.7 -> 8.2 -> 6.6 -> transfused 1 unit of PRBCs 12/27 -> post transfusion Hg 7.2 -> Hg 7.6.  Iron 53.  Ferritin 1330.  B12 level 1036.  Folate 5.5.  Erythropoietin 8.7.  Absolute retic count 14.9.  TSH 3.078.  FOBT negative.  No overt GI bleeding (chronically passes black stools on Ferrous Sulfate QD at home). -EGD and colonoscopy tomorrow, benefits and risks discussed including risk with sedation,  risk of bleeding, perforation and infection  -Movi prep 2 and Dulcolax '10mg'$  at 4pm and 10pm -Clear Liquid diet -NPO after midnight -Await further recommendations per Dr. Lorenso Courier  3) RLQ pain. CTAP showed focal thickening to the ascending colon, mass could not be rule out. Diverticulosis without evidence of diverticulitis.  -Colonoscopy as ordered as ordered above    4) DM II   5) CAD   6) PVD s/p bilateral AKA  Principal Problem:   Acute renal failure (ARF) ([facility]) Active Problems:   Hypothyroidism   Type 2 diabetes mellitus ([facility])   Hyperkalemia   Anemia   Elevated troponin   Thrombocytopenia ([facility])   S/P AKA (above knee amputation) ([place])   Abdominal pain     LOS: 2 days   Noralyn Pick  08/16/2022, 12:08PM

## 2022-08-16 NOTE — H&P (View-Only) (Signed)
[facility] Progress Note  CC:  Anemia, dark stools    Subjective: Her RLQ pain abated after eating a full liquid diet.  No nausea or vomiting.  She passed a loose black stool earlier this morning.  No chest pain or shortness of breath.  No family at the bedside.   Objective:  Vital signs in last 24 hours: Temp:  [97.9 F (36.6 C)-98.4 F (36.9 C)] 98.4 F (36.9 C) (12/29 0816) Pulse Rate:  [74-86] 86 (12/29 0816) Resp:  [14-16] 16 (12/29 0816) BP: (111-127)/(63-79) 113/68 (12/29 0816) SpO2:  [98 %-100 %] 100 % (12/29 0816) Last BM Date : 08/14/22 General: 76 year old female in no acute distress. Heart: Regular rate and rhythm, no murmurs. Pulm: Breath sounds clear throughout. Abdomen: Soft, nondistended.  Mild RLQ tenderness without rebound or guarding.  Positive bowel sounds to all 4 quadrants. Extremities: Bilateral AKA. Neurologic:  Alert and  oriented x 4. Grossly normal neurologically. Psych:  Alert and cooperative. Normal mood and affect.  Intake/Output from previous day: 12/28 0701 - 12/29 0700 In: -  Out: 1 [Stool:1] Intake/Output this shift: No intake/output data recorded.  Lab Results: Recent Labs    08/14/22 0556 08/14/22 1607 08/15/22 0346 08/16/22 0406  WBC 6.2  --  5.8 5.7  HGB 6.6* 7.2* 7.6* 7.6*  HCT 22.9* 24.3* 26.3* 24.4*  PLT 254  --  233 192   BMET Recent Labs    08/14/22 1607 08/15/22 0346 08/16/22 0406  NA 133* 134* 136  K 5.2* 5.4* 5.2*  CL 110 113* 115*  CO2 16* 17* 17*  GLUCOSE 126* 91 80  BUN 78* 72* 55*  CREATININE 2.36* 2.09* 1.62*  CALCIUM 7.8* 8.1* 8.1*   LFT Recent Labs    08/13/22 2215  PROT 6.7  ALBUMIN 2.0*  AST 15  ALT 5  ALKPHOS 51  BILITOT 1.1  BILIDIR 0.2  IBILI 0.9   PT/INR No results for input(s): "LABPROT", "INR" in the last 72 hours. Hepatitis Panel No results for input(s): "HEPBSAG", "HCVAB", "HEPAIGM", "HEPBIGM" in the last 72 hours.  CT ABDOMEN PELVIS WO CONTRAST  Result  Date: 08/15/2022 CLINICAL DATA:  RIGHT LOWER QUADRANT abdominal pain. EXAM: CT ABDOMEN AND PELVIS WITHOUT CONTRAST TECHNIQUE: Multidetector CT imaging of the abdomen and pelvis was performed following the standard protocol without IV contrast. RADIATION DOSE REDUCTION: This exam was performed according to the departmental dose-optimization program which includes automated exposure control, adjustment of the mA and/or kV according to patient size and/or use of iterative reconstruction technique. COMPARISON:  Renal ultrasound 08/14/2022 FINDINGS: Lower chest: Headache, changes are identified at the lung bases. There is partially imaged consolidation within the posterior RIGHT LOWER lobe. There is significant atherosclerotic calcification of coronary arteries. Heart size is normal. No pericardial effusion. Hepatobiliary: Prior cholecystectomy. Normal appearance of the liver. Pancreas: Unremarkable. No pancreatic ductal dilatation or surrounding inflammatory changes. Spleen: Normal in size without focal abnormality. Adrenals/Urinary Tract: Adrenal glands are normal. There are bilateral renal artery calcifications. Intrarenal stone in the LOWER pole the RIGHT kidney measures 5 millimeters. There is no hydronephrosis. RIGHT ureter is unremarkable. A 1 millimeter intrarenal calculus is identified in the LOWER pole of the LEFT kidney. No hydronephrosis. LEFT ureter is unremarkable. The urinary bladder is distended and otherwise normal in appearance. Stomach/Bowel: Stomach and small bowel loops are normal in appearance. The appendix is not well seen. There are scattered colonic diverticula but no associated inflammatory changes. Within the ascending colon, there is a focal  area of thickening at a haustral fold, raising the question of circumferential mass. Findings are best seen on coronal and sagittal images (image 76 of series 6, image 42 of series 7). Vascular/Lymphatic: There is extensive atherosclerotic calcification  of the abdominal and pelvic arteries. No evidence for abdominal adenopathy. Reproductive: Uterus is present. No adnexal mass. No free pelvic fluid. Other: Patient is cachectic.  Diffuse body wall edema noted. Musculoskeletal: No acute fractures or subluxations. Remote inferior endplate fracture of L4. Remote superior endplate fracture of B31. IMPRESSION: 1. Partially imaged consolidation within the posterior RIGHT LOWER lobe, consistent with pneumonia. Chronic honeycomb changes at the lung bases. 2. Focal area of thickening at a haustral fold in the ascending colon, raising the question of circumferential mass. Recommend further evaluation with colonoscopy. 3. Colonic diverticulosis without acute diverticulitis. 4. Prior cholecystectomy. 5. Bilateral nonobstructing intrarenal calculi. 6. Diffuse body wall edema. 7. Remote fractures of T12 and L4. These results will be called to the ordering clinician or representative by the Radiologist Assistant, and communication documented in the PACS or [employer]. Electronically Signed   By: Nolon Nations M.D.   On: 08/15/2022 15:22    Assessment / Plan:  48) 76 year old female admitted to the hospital 08/13/2022 with AKI on CKD with hyperkalemia. Cr 2.89 -> 2.09 -> 1.62. K+ 6.5 -> 5.4 -> 5.2. Renal ultrasound showed bilateral nonobstructing stones, increased renal echo consistent with medical renal disease, small degree debris in the bladder.   Received Lokelma x 2 doses. -Management per the medical service   2) Normocytic anemia. On oral iron at home. Admission Hg 7.7 -> 8.2 -> 6.6 -> transfused 1 unit of PRBCs 12/27 -> post transfusion Hg 7.2 -> Hg 7.6.  Iron 53.  Ferritin 1330.  B12 level 1036.  Folate 5.5.  Erythropoietin 8.7.  Absolute retic count 14.9.  TSH 3.078.  FOBT negative.  No overt GI bleeding (chronically passes black stools on Ferrous Sulfate QD at home). -EGD and colonoscopy tomorrow, benefits and risks discussed including risk with sedation,  risk of bleeding, perforation and infection  -Movi prep 2 and Dulcolax '10mg'$  at 4pm and 10pm -Clear Liquid diet -NPO after midnight -Await further recommendations per Dr. Lorenso Courier  3) RLQ pain. CTAP showed focal thickening to the ascending colon, mass could not be rule out. Diverticulosis without evidence of diverticulitis.  -Colonoscopy as ordered as ordered above    4) DM II   5) CAD   6) PVD s/p bilateral AKA  Principal Problem:   Acute renal failure (ARF) (HCC) Active Problems:   Hypothyroidism   Type 2 diabetes mellitus (HCC)   Hyperkalemia   Anemia   Elevated troponin   Thrombocytopenia (HCC)   S/P AKA (above knee amputation) ([place])   Abdominal pain     LOS: 2 days   Noralyn Pick  08/16/2022, 12:08PM

## 2022-08-17 ENCOUNTER — Inpatient Hospital Stay (HOSPITAL_COMMUNITY): Payer: Medicare (Managed Care) | Admitting: Anesthesiology

## 2022-08-17 ENCOUNTER — Encounter (HOSPITAL_COMMUNITY)
Admission: EM | Disposition: A | Discharge status: Skilled Nursing Facility | Payer: Self-pay | Source: Skilled Nursing Facility | Attending: Internal Medicine

## 2022-08-17 ENCOUNTER — Encounter (HOSPITAL_COMMUNITY): Payer: Self-pay | Admitting: Family Medicine

## 2022-08-17 DIAGNOSIS — K222 Esophageal obstruction: Secondary | ICD-10-CM

## 2022-08-17 DIAGNOSIS — D126 Benign neoplasm of colon, unspecified: Secondary | ICD-10-CM

## 2022-08-17 DIAGNOSIS — K5730 Diverticulosis of large intestine without perforation or abscess without bleeding: Secondary | ICD-10-CM

## 2022-08-17 DIAGNOSIS — K2971 Gastritis, unspecified, with bleeding: Secondary | ICD-10-CM

## 2022-08-17 DIAGNOSIS — I10 Essential (primary) hypertension: Secondary | ICD-10-CM

## 2022-08-17 DIAGNOSIS — K648 Other hemorrhoids: Secondary | ICD-10-CM

## 2022-08-17 DIAGNOSIS — D125 Benign neoplasm of sigmoid colon: Secondary | ICD-10-CM | POA: Diagnosis not present

## 2022-08-17 DIAGNOSIS — I2510 Atherosclerotic heart disease of native coronary artery without angina pectoris: Secondary | ICD-10-CM

## 2022-08-17 DIAGNOSIS — D509 Iron deficiency anemia, unspecified: Secondary | ICD-10-CM

## 2022-08-17 DIAGNOSIS — D122 Benign neoplasm of ascending colon: Secondary | ICD-10-CM

## 2022-08-17 DIAGNOSIS — N179 Acute kidney failure, unspecified: Secondary | ICD-10-CM | POA: Diagnosis not present

## 2022-08-17 HISTORY — PX: BIOPSY: SHX5522

## 2022-08-17 HISTORY — PX: COLONOSCOPY WITH PROPOFOL: SHX5780

## 2022-08-17 HISTORY — PX: POLYPECTOMY: SHX5525

## 2022-08-17 HISTORY — PX: ESOPHAGOGASTRODUODENOSCOPY (EGD) WITH PROPOFOL: SHX5813

## 2022-08-17 LAB — CBC
HCT: 25 % — ABNORMAL LOW (ref 36.0–46.0)
Hemoglobin: 7.2 g/dL — ABNORMAL LOW (ref 12.0–15.0)
MCH: 28.6 pg (ref 26.0–34.0)
MCHC: 28.8 g/dL — ABNORMAL LOW (ref 30.0–36.0)
MCV: 99.2 fL (ref 80.0–100.0)
Platelets: 205 10*3/uL (ref 150–400)
RBC: 2.52 MIL/uL — ABNORMAL LOW (ref 3.87–5.11)
RDW: 14 % (ref 11.5–15.5)
WBC: 5.3 10*3/uL (ref 4.0–10.5)
nRBC: 0 % (ref 0.0–0.2)

## 2022-08-17 LAB — BASIC METABOLIC PANEL
Anion gap: 7 (ref 5–15)
BUN: 41 mg/dL — ABNORMAL HIGH (ref 8–23)
CO2: 14 mmol/L — ABNORMAL LOW (ref 22–32)
Calcium: 7.9 mg/dL — ABNORMAL LOW (ref 8.9–10.3)
Chloride: 115 mmol/L — ABNORMAL HIGH (ref 98–111)
Creatinine, Ser: 1.33 mg/dL — ABNORMAL HIGH (ref 0.44–1.00)
GFR, Estimated: 41 mL/min — ABNORMAL LOW (ref >60)
Glucose, Bld: 76 mg/dL (ref 70–99)
Potassium: 4.5 mmol/L (ref 3.5–5.1)
Sodium: 136 mmol/L (ref 135–145)

## 2022-08-17 LAB — GLUCOSE, CAPILLARY
Glucose-Capillary: 73 mg/dL (ref 70–99)
Glucose-Capillary: 71 mg/dL (ref 70–99)

## 2022-08-17 SURGERY — ESOPHAGOGASTRODUODENOSCOPY (EGD) WITH PROPOFOL
Anesthesia: Monitor Anesthesia Care

## 2022-08-17 MED ORDER — PHENYLEPHRINE 80 MCG/ML (10ML) SYRINGE FOR IV PUSH (FOR BLOOD PRESSURE SUPPORT)
PREFILLED_SYRINGE | INTRAVENOUS | Status: DC | PRN
  Administered 2022-08-17 (×2): 80 ug via INTRAVENOUS

## 2022-08-17 MED ORDER — PROPOFOL 500 MG/50ML IV EMUL
INTRAVENOUS | Status: DC | PRN
  Administered 2022-08-17: 75 ug/kg/min via INTRAVENOUS

## 2022-08-17 MED ORDER — SODIUM CHLORIDE 0.9 % IV SOLN
INTRAVENOUS | Status: AC | PRN
  Administered 2022-08-17: 500 mL via INTRAVENOUS

## 2022-08-17 MED ORDER — PANTOPRAZOLE SODIUM 40 MG PO TBEC
40.0000 mg | DELAYED_RELEASE_TABLET | Freq: Two times a day (BID) | ORAL | Status: DC
  Administered 2022-08-17 – 2022-08-19 (×4): 40 mg via ORAL
  Filled 2022-08-17 (×4): qty 1

## 2022-08-17 MED ORDER — ACETAMINOPHEN 325 MG PO TABS
650.0000 mg | ORAL_TABLET | Freq: Four times a day (QID) | ORAL | Status: DC | PRN
  Administered 2022-08-17 – 2022-08-19 (×2): 650 mg via ORAL
  Filled 2022-08-17 (×2): qty 2

## 2022-08-17 MED ORDER — LIDOCAINE 2% (20 MG/ML) 5 ML SYRINGE
INTRAMUSCULAR | Status: DC | PRN
  Administered 2022-08-17: 60 mg via INTRAVENOUS

## 2022-08-17 MED ORDER — LACTATED RINGERS IV SOLN: INTRAVENOUS | Status: DC | PRN

## 2022-08-17 SURGICAL SUPPLY — 25 items

## 2022-08-17 NOTE — Evaluation (Signed)
Clinical/Bedside Swallow Evaluation Patient Details  Name: [name] MRN: [id number] Date of Birth: 1946-04-11  Today's Date: 08/17/2022 Time: SLP Start Time (ACUTE ONLY): 9562 SLP Stop Time (ACUTE ONLY): 1601 SLP Time Calculation (min) (ACUTE ONLY): 18 min  Past Medical History:  Past Medical History:  Diagnosis Date   Bilateral lower extremity edema 06/06/2014   Cataracts, bilateral 02/13/2011   Seen on eye exam 02/07/11. F/u in 12 months    Diabetes mellitus age 51   Diabetic ulcer of toe ([place]) 12/12/2015   Hyperlipidemia    Hypertension    Retinal detachment, old, partial    left   Retinopathy due to secondary diabetes mellitus ([place])    L>R, laser 3/07   Schizotypal personality disorder ([place])    Thyroid disease    Weight loss 01/22/2012   Past Surgical History:  Past Surgical History:  Procedure Laterality Date   ABDOMINAL AORTOGRAM W/LOWER EXTREMITY N/A 07/23/2018   Procedure: ABDOMINAL AORTOGRAM W/LOWER EXTREMITY;  Surgeon: Kari Heck, MD;  Location: [facility];  Service: Cardiovascular;  Laterality: N/A;   ABOVE KNEE LEG AMPUTATION Bilateral 09/27/2019   AMPUTATION Bilateral 09/27/2019   Procedure: AMPUTATION ABOVE KNEE;  Surgeon: Kari Mould, MD;  Location: [facility];  Service: Vascular;  Laterality: Bilateral;   BREAST EXCISIONAL BIOPSY Bilateral    BREAST SURGERY     CATARACT EXTRACTION     left    EYE SURGERY     ROTATOR CUFF REPAIR  1990's   left   Thyroid radiation ablation     for Graves Disease   TRANSTHORACIC ECHOCARDIOGRAM      EF55-65%, nml - 12/14/2004   HPI:  Patient is a 76 year old female who presented from skilled nursing facility for the evaluation of abnormal labs.  No history of vomiting, diarrhea, eating/drinking well.  She had noticed that her stool and urine were dark. CT abdomen suspected showed consolidation within the posterior RIGHT LOWER  lobe, consistent with pneumonia,chronic honeycomb changes at the lung  bases,focal area of thickening at a haustral fold in the ascending  colon, raising the question of circumferential mass.  GI consulted. Underwent EGD/colonoscopy on 12/30 wit findings of nonobstructive shatzki's ring and GE junction.  Pt with with history of hypertension, interstitial lung disease, type 2 diabetes, peripheral artery disease, status post bilateral AKA wheelchair-bound, RA.    Assessment / Plan / Recommendation  Clinical Impression  Pt presents with a mild oral dysphagia 2/2 edentulism, but pt was able to achieve good oral clearance with additional time.  There were no clinical s/s of aspiration with any consistencies trialed. Pt utilized liquid was to aid in mastication and clearance of regular solid. Discussed diet preference with pt.  Her baseline diet sounds most consistent with a ground/chopped texture.  Pt  is agreeable to resuming that diet. SLP to follow for tolerance.  Pt with abnormal chest imaging.  If there is specific concern for silent aspiration, please place orders for MBSS.    Recommend ground/chopped diet (D2) with thin liquids.  SLP Visit Diagnosis: Dysphagia, oral phase (R13.11)    Aspiration Risk  Mild aspiration risk    Diet Recommendation Dysphagia 2 (Fine chop);Thin liquid   Liquid Administration via: Cup;Straw Medication Administration: Whole meds with liquid (as tolerated/no specific precautions) Supervision: Patient able to self feed Compensations: Slow rate;Small sips/bites Postural Changes: Seated upright at 90 degrees    Other  Recommendations Oral Care Recommendations: Oral care BID    Recommendations for follow up  therapy are one component of a multi-disciplinary discharge planning process, led by the attending physician.  Recommendations may be updated based on patient status, additional functional criteria and insurance authorization.  Follow up Recommendations Skilled nursing-short term rehab (<3 hours/day)      Assistance Recommended at  Discharge    Functional Status Assessment Patient has had a recent decline in their functional status and demonstrates the ability to make significant improvements in function in a reasonable and predictable amount of time.  Frequency and Duration min 1 x/week  1 week       Prognosis Prognosis for Safe Diet Advancement:  (Modified diet at baseline)      Swallow Study   General Date of Onset: 08/13/22 HPI: Patient is a 76 year old female who presented from skilled nursing facility for the evaluation of abnormal labs.  No history of vomiting, diarrhea, eating/drinking well.  She had noticed that her stool and urine were dark. CT abdomen suspected showed consolidation within the posterior RIGHT LOWER  lobe, consistent with pneumonia,chronic honeycomb changes at the lung bases,focal area of thickening at a haustral fold in the ascending  colon, raising the question of circumferential mass.  GI consulted. Underwent EGD/colonoscopy on 12/30 wit findings of nonobstructive shatzki's ring and GE junction.  Pt with with history of hypertension, interstitial lung disease, type 2 diabetes, peripheral artery disease, status post bilateral AKA wheelchair-bound, RA. Type of Study: Bedside Swallow Evaluation Previous Swallow Assessment: Clinical assessment in August of 2023 Diet Prior to this Study: Dysphagia 1 (puree);Thin liquids Temperature Spikes Noted: No History of Recent Intubation: Yes Length of Intubations (days):  (for procedure) Behavior/Cognition: Alert;Cooperative;Pleasant mood Oral Cavity Assessment: Within Functional Limits Oral Care Completed by SLP: No Oral Cavity - Dentition: Edentulous;Dentures, top (declined placement of dentures) Self-Feeding Abilities: Able to feed self Patient Positioning: Upright in bed Baseline Vocal Quality: Normal Volitional Cough: Strong Volitional Swallow: Able to elicit    Oral/Motor/Sensory Function Overall Oral Motor/Sensory Function: Within functional  limits Facial ROM: Within Functional Limits Facial Symmetry: Within Functional Limits Lingual ROM: Within Functional Limits Lingual Symmetry: Within Functional Limits Lingual Strength: Within Functional Limits Velum: Within Functional Limits Mandible: Within Functional Limits   Ice Chips Ice chips: Not tested   Thin Liquid Thin Liquid: Within functional limits Presentation: Straw    Nectar Thick Nectar Thick Liquid: Not tested   Honey Thick Honey Thick Liquid: Not tested   Puree Puree: Within functional limits Presentation: Spoon   Solid     Solid: Impaired Oral Phase Functional Implications: Prolonged oral transit      Celedonio Savage, MA, [place] Office: [phone] 08/17/2022,4:21 PM

## 2022-08-17 NOTE — Progress Notes (Signed)
PROGRESS NOTE  [name]  JYN:[id number] DOB: November 21, 1945 DOA: 08/13/2022 PCP: Janifer Adie, MD   Brief Narrative: Patient is a 76 year old female with history of hypertension, interstitial lung disease, type 2 diabetes, peripheral artery disease, status post bilateral AKA wheelchair-bound ,RA who presented from skilled nursing facility for the evaluation of abnormal labs.  No history of vomiting, diarrhea, eating/drinking well.  She had noticed that her stool and urine were dark.  On presentation, creatinine was 2.8, potassium was 6.5, hemoglobin 7.7.  CT abdomen suspected showed consolidation within the posterior RIGHT LOWER lobe, consistent with pneumonia,chronic honeycomb changes at the lung bases,focal area of thickening at a haustral fold in the ascending colon, raising the question of circumferential mass.  GI consulted. Underwent EGD/colonoscopy on 12/30.  Assessment & Plan:  Principal Problem:   Acute renal failure (ARF) (HCC) Active Problems:   Hypothyroidism   Type 2 diabetes mellitus (HCC)   Hyperkalemia   Anemia   Elevated troponin   Thrombocytopenia (HCC)   S/P AKA (above knee amputation) (HCC)   Abdominal pain   Abnormal CT of the abdomen  Acute renal failure: Unclear etiology.  No history of NSAIDs use.  Renal ultrasound was consistent with medical renal disease.  Renal function is improved with IV fluids.  Avoid nephrotoxins.  Suspected Colon mass: CT scan showed focal area of thickening at a haustral fold in the ascending colon, raising the question of circumferential mass.  GI consulted.  S/P  EGD/colonoscopy on 12/30. EGD showed gastritis with gastritis ulcers that had signs of recent bleeding. Take gastric biopsies to evaluate for H pylori. Colonoscopy with removal of 3 colon polyps. No signs of cancer or mass.GI recommended PPI BID for 8 weeks.GI will arrange for clinic follow up.   History of recent pneumonia: Was started on ceftriaxone as an outpatient.   CT showed right lower lobe pneumonia.  Currently she is symptomatically improved, on room air.  No leukocytosis or fever.  Normocytic anemia: Currently hemoglobin that is of 7.  Iron studies showed low folate, currently being replaced.  Iron level was normal.  Continue to monitor, transfuse if hemoglobin drops less than 7.  Hyperkalemia: Secondary to acute renal failure.  Given Lokelma.  Resolved  Thrombocytopenia: Resolved  Elevated troponin: No chest pain.  Might be secondary to supply/demand ischemia secondary to acute renal failure.  No ischemic change in the EKG  Type 2 diabetes: Last A1c of 6.2.  Well-controlled.  Currently on sliding scale insulin  Hypothyroidism: Continue Synthyroid  History of RA: Prednisone          DVT prophylaxis:SCD     Code Status: Full Code  Family Communication: None at bedside  Patient status:Inpatient   Patient is from :SNF  Anticipated discharge to:SNF  Estimated DC date:tomorrow   Consultants: GI  Procedures:EGD/colonoscopy  Antimicrobials:  Anti-infectives (From admission, onward)    Start     Dose/Rate Route Frequency Ordered Stop   08/14/22 1715  cefTRIAXone (ROCEPHIN) 1 g in sodium chloride 0.9 % 100 mL IVPB        1 g 200 mL/hr over 30 Minutes Intravenous Every 24 hours 08/14/22 1627 08/18/22 1714       Subjective:  Patient seen and examined at bedside today.  Hemodynamically stable.  Comfortable.  Lying in bed.  Alert and oriented.  Looks deconditioned, debilitated.  Status post bilateral AKA.  Denies any abdominal pain, nausea or vomiting.waiting for EGD/colo Objective: Vitals:   08/16/22 0503 08/16/22 0816 08/16/22 1937  08/17/22 0407  BP: 111/66 113/68 120/68 (!) 106/59  Pulse: 78 86 75 74  Resp: _0 Temp: 97.9 F (36.6 C) 98.4 F (36.9 C) 98.2 F (36.8 C) 98.2 F (36.8 C)  TempSrc:  Oral Oral Oral  SpO2: 99% 100% 100% 100%  Weight:      Height:        Intake/Output Summary (Last 24 hours)  at 08/17/2022 0742 Last data filed at 08/17/2022 0302 Gross per 24 hour  Intake 1834.12 ml  Output --  Net 1834.12 ml   Filed Weights   08/14/22 0100  Weight: 38.1 kg    Examination:  General exam: Overall comfortable, not in distress, very deconditioned, chronically ill looking, weak HEENT: PERRL Respiratory system:  no wheezes or crackles  Cardiovascular system: S1 & S2 heard, RRR.  Gastrointestinal system: Abdomen is nondistended, soft and nontender. Central nervous system: Alert and oriented Extremities: Bilateral AKA, deformity of the hands from rheumatoid arthritis Skin: No rashes, no ulcers,no icterus     Data Reviewed: I have personally reviewed following labs and imaging studies  CBC: Recent Labs  Lab 08/13/22 2215 08/13/22 2221 08/14/22 0556 08/14/22 1607 08/15/22 0346 08/16/22 0406 08/17/22 0354  WBC 7.4  --  6.2  --  5.8 5.7 5.3  NEUTROABS 5.4  --   --   --   --   --   --   HGB 7.7*   < > 6.6* 7.2* 7.6* 7.6* 7.2*  HCT 25.1*   < > 22.9* 24.3* 26.3* 24.4* 25.0*  MCV 96.2  --  98.3  --  95.6 92.8 99.2  PLT 62*  --  254  --  233 192 205   < > = values in this interval not displayed.   Basic Metabolic Panel: Recent Labs  Lab 08/13/22 2215 08/13/22 2221 08/14/22 0556 08/14/22 1607 08/15/22 0346 08/16/22 0406 08/17/22 0354  NA 134*   < > 131* 133* 134* 136 136  K 6.5*   < > 5.6* 5.2* 5.4* 5.2* 4.5  CL 109   < > 108 110 113* 115* 115*  CO2 15*  --  15* 16* 17* 17* 14*  GLUCOSE 144*   < > 171* 126* 91 80 76  BUN 94*   < > 86* 78* 72* 55* 41*  CREATININE 2.89*   < > 2.51* 2.36* 2.09* 1.62* 1.33*  CALCIUM 8.2*  --  7.8* 7.8* 8.1* 8.1* 7.9*  MG 1.9  --   --   --   --   --   --    < > = values in this interval not displayed.     Recent Results (from the past 240 hour(s))  MRSA Next Gen by PCR, Nasal     Status: Abnormal   Collection Time: 08/16/22 10:30 AM   Specimen: Nasal Mucosa; Nasal Swab  Result Value Ref Range Status   MRSA by PCR Next Gen  DETECTED (A) NOT DETECTED Final    Comment: [facility] NOTIFIED BY HEATHER GUILLAUME RN ON 12.29.23 ON 1402 BY EM (NOTE) The GeneXpert MRSA Assay (FDA approved for NASAL specimens only), is one component of a comprehensive MRSA colonization surveillance program. It is not intended to diagnose MRSA infection nor to guide or monitor treatment for MRSA infections. Test performance is not FDA approved in patients less than 24 years old. Performed at [facility], [place] [address], [place],  [address]      Radiology Studies: CT ABDOMEN PELVIS  WO CONTRAST  Result Date: 08/15/2022 CLINICAL DATA:  RIGHT LOWER QUADRANT abdominal pain. EXAM: CT ABDOMEN AND PELVIS WITHOUT CONTRAST TECHNIQUE: Multidetector CT imaging of the abdomen and pelvis was performed following the standard protocol without IV contrast. RADIATION DOSE REDUCTION: This exam was performed according to the departmental dose-optimization program which includes automated exposure control, adjustment of the mA and/or kV according to patient size and/or use of iterative reconstruction technique. COMPARISON:  Renal ultrasound 08/14/2022 FINDINGS: Lower chest: Headache, changes are identified at the lung bases. There is partially imaged consolidation within the posterior RIGHT LOWER lobe. There is significant atherosclerotic calcification of coronary arteries. Heart size is normal. No pericardial effusion. Hepatobiliary: Prior cholecystectomy. Normal appearance of the liver. Pancreas: Unremarkable. No pancreatic ductal dilatation or surrounding inflammatory changes. Spleen: Normal in size without focal abnormality. Adrenals/Urinary Tract: Adrenal glands are normal. There are bilateral renal artery calcifications. Intrarenal stone in the LOWER pole the RIGHT kidney measures 5 millimeters. There is no hydronephrosis. RIGHT ureter is unremarkable. A 1 millimeter intrarenal calculus is identified in the LOWER pole of the LEFT kidney. No  hydronephrosis. LEFT ureter is unremarkable. The urinary bladder is distended and otherwise normal in appearance. Stomach/Bowel: Stomach and small bowel loops are normal in appearance. The appendix is not well seen. There are scattered colonic diverticula but no associated inflammatory changes. Within the ascending colon, there is a focal area of thickening at a haustral fold, raising the question of circumferential mass. Findings are best seen on coronal and sagittal images (image 76 of series 6, image 42 of series 7). Vascular/Lymphatic: There is extensive atherosclerotic calcification of the abdominal and pelvic arteries. No evidence for abdominal adenopathy. Reproductive: Uterus is present. No adnexal mass. No free pelvic fluid. Other: Patient is cachectic.  Diffuse body wall edema noted. Musculoskeletal: No acute fractures or subluxations. Remote inferior endplate fracture of L4. Remote superior endplate fracture of Q73. IMPRESSION: 1. Partially imaged consolidation within the posterior RIGHT LOWER lobe, consistent with pneumonia. Chronic honeycomb changes at the lung bases. 2. Focal area of thickening at a haustral fold in the ascending colon, raising the question of circumferential mass. Recommend further evaluation with colonoscopy. 3. Colonic diverticulosis without acute diverticulitis. 4. Prior cholecystectomy. 5. Bilateral nonobstructing intrarenal calculi. 6. Diffuse body wall edema. 7. Remote fractures of T12 and L4. These results will be called to the ordering clinician or representative by the Radiologist Assistant, and communication documented in the PACS or [employer]. Electronically Signed   By: Nolon Nations M.D.   On: 08/15/2022 15:22    Scheduled Meds:  bisacodyl  10 mg Oral Once   folic acid  1 mg Oral Daily   levothyroxine  100 mcg Oral QAC breakfast   pantoprazole  40 mg Oral Daily   peg 3350 powder  0.5 kit Oral Once   predniSONE  5 mg Oral Q breakfast   Continuous  Infusions:  sodium chloride Stopped (08/16/22 1728)   sodium chloride 20 mL/hr at 08/17/22 0302   cefTRIAXone (ROCEPHIN)  IV Stopped (08/16/22 1701)     LOS: 3 days   Shelly Coss, MD [facility] P12/30/2023, 7:42 AM

## 2022-08-17 NOTE — Op Note (Signed)
[facility] Patient Name: [name] Procedure Date : 08/17/2022 MRN: [id number] Attending MD: Georgian Co , , [id number] Date of Birth: 01-Oct-1945 CSN: [id number] Age: 76 Admit Type: Inpatient Procedure:                Colonoscopy Indications:              Iron deficiency anemia, Abnormal CT of the GI tract Providers:                Adline Mango" Alva Garnet                            Technician, Technician Referring MD:             Hospitalist team Medicines:                Monitored Anesthesia Care Complications:            No immediate complications. Estimated Blood Loss:     Estimated blood loss was minimal. Procedure:                Pre-Anesthesia Assessment:                           - Prior to the procedure, a History and Physical                            was performed, and patient medications and                            allergies were reviewed. The patient's tolerance of                            previous anesthesia was also reviewed. The risks                            and benefits of the procedure and the sedation                            options and risks were discussed with the patient.                            All questions were answered, and informed consent                            was obtained. Prior Anticoagulants: The patient has                            taken no anticoagulant or antiplatelet agents. ASA                            Grade Assessment: III - A patient with severe                            systemic disease. After reviewing the risks and  benefits, the patient was deemed in satisfactory                            condition to undergo the procedure.                           After obtaining informed consent, the colonoscope                            was passed under direct vision. Throughout the                            procedure, the patient's blood pressure,  pulse, and                            oxygen saturations were monitored continuously. The                            [id number]-HQ190TL ([id number] Olympus peds colonoscope was                            introduced through the anus and advanced to the the                            terminal ileum. The colonoscopy was performed                            without difficulty. The patient tolerated the                            procedure well. The quality of the bowel                            preparation was fair. The terminal ileum, ileocecal                            valve, appendiceal orifice, and rectum were                            photographed. Scope In: 10:48:15 AM Scope Out: 11:14:10 AM Scope Withdrawal Time: 0 hours 19 minutes 41 seconds  Total Procedure Duration: 0 hours 25 minutes 55 seconds  Findings:      The terminal ileum appeared normal.      A 6 mm polyp was found in the ascending colon. The polyp was sessile.       The polyp was removed with a cold snare. Resection and retrieval were       complete.      Multiple diverticula were found in the sigmoid colon and descending       colon.      Two sessile polyps were found in the sigmoid colon. The polyps were 8 to       12 mm in size. These polyps were removed with a cold snare. Resection       and retrieval were complete.      Non-bleeding internal hemorrhoids were found  during retroflexion. Impression:               - Preparation of the colon was fair.                           - The examined portion of the ileum was normal.                           - One 6 mm polyp in the ascending colon, removed                            with a cold snare. Resected and retrieved.                           - Diverticulosis in the sigmoid colon and in the                            descending colon.                           - Two 8 to 12 mm polyps in the sigmoid colon,                            removed with a cold snare. Resected and  retrieved.                           - Non-bleeding internal hemorrhoids. Recommendation:           - Return patient to hospital ward for ongoing care.                           - It is suspected that the patient's anemia is                            partially due to gastritis/gastric ulcer seen on                            EGD.                           - Await pathology results.                           - The findings and recommendations were discussed                            with the patient. Procedure Code(s):        --- Professional ---                           [phone], Colonoscopy, flexible; with removal of                            tumor(s), polyp(s), or other lesion(s) by snare  technique Diagnosis Code(s):        --- Professional ---                           K64.8, Other hemorrhoids                           D12.2, Benign neoplasm of ascending colon                           D12.5, Benign neoplasm of sigmoid colon                           D50.9, Iron deficiency anemia, unspecified                           K57.30, Diverticulosis of large intestine without                            perforation or abscess without bleeding                           R93.3, Abnormal findings on diagnostic imaging of                            other parts of digestive tract CPT copyright 2022 American Medical Association. All rights reserved. The codes documented in this report are preliminary and upon coder review may  be revised to meet current compliance requirements. Dr Georgian Co "Lyndee Leo" Lorenso Courier,  08/17/2022 11:20:37 AM Number of Addenda: 0

## 2022-08-17 NOTE — Transfer of Care (Signed)
Immediate Anesthesia Transfer of Care Note  Patient: [name]  Procedure(s) Performed: ESOPHAGOGASTRODUODENOSCOPY (EGD) WITH PROPOFOL COLONOSCOPY WITH PROPOFOL BIOPSY POLYPECTOMY  Patient Location: PACU  Anesthesia Type:MAC  Level of Consciousness: awake, alert , oriented, and patient cooperative  Airway & Oxygen Therapy: Patient Spontanous Breathing  Post-op Assessment: Report given to RN and Post -op Vital signs reviewed and stable  Post vital signs: Reviewed and stable  Last Vitals:  Vitals Value Taken Time  BP 134/65 08/17/22 1140  Temp 36.1 C 08/17/22 1122  Pulse 65 08/17/22 1146  Resp 16 08/17/22 1146  SpO2 99 % 08/17/22 1146  Vitals shown include unvalidated device data.  Last Pain:  Vitals:   08/17/22 1140  TempSrc:   PainSc: 0-No pain         Complications: No notable events documented.

## 2022-08-17 NOTE — Interval H&P Note (Signed)
History and Physical Interval Note:  08/17/2022 10:27 AM  [name]  has presented today for surgery, with the diagnosis of Anemia, possible right colon mass.  The various methods of treatment have been discussed with the patient and family. After consideration of risks, benefits and other options for treatment, the patient has consented to  Procedure(s): ESOPHAGOGASTRODUODENOSCOPY (EGD) WITH PROPOFOL (N/A) COLONOSCOPY WITH PROPOFOL (N/A) as a surgical intervention.  The patient's history has been reviewed, patient examined, no change in status, stable for surgery.  I have reviewed the patient's chart and labs.  Questions were answered to the patient's satisfaction.     Sharyn Creamer

## 2022-08-17 NOTE — Anesthesia Postprocedure Evaluation (Signed)
Anesthesia Post Note  Patient: [name]  Procedure(s) Performed: ESOPHAGOGASTRODUODENOSCOPY (EGD) WITH PROPOFOL COLONOSCOPY WITH PROPOFOL BIOPSY POLYPECTOMY     Patient location during evaluation: PACU Anesthesia Type: MAC Level of consciousness: awake and alert Pain management: pain level controlled Vital Signs Assessment: post-procedure vital signs reviewed and stable Respiratory status: spontaneous breathing, nonlabored ventilation, respiratory function stable and patient connected to nasal cannula oxygen Cardiovascular status: stable and blood pressure returned to baseline Postop Assessment: no apparent nausea or vomiting Anesthetic complications: no  No notable events documented.  Last Vitals:  Vitals:   08/17/22 1140 08/17/22 1150  BP: 134/65 (!) 131/59  Pulse: 69 65  Resp: 12 13  Temp:    SpO2: 98% 99%    Last Pain:  Vitals:   08/17/22 1150  TempSrc:   PainSc: 0-No pain                 Tiajuana Amass

## 2022-08-17 NOTE — Op Note (Signed)
[facility] Patient Name: [name] Procedure Date : 08/17/2022 MRN: [id number] Attending MD: Georgian Co , , [id number] Date of Birth: 11/18/1945 CSN: [id number] Age: 76 Admit Type: Inpatient Procedure:                Upper GI endoscopy Indications:              Iron deficiency anemia Providers:                Adline Mango" Alva Garnet                            Technician, Technician Referring MD:             Hospitalist team Medicines:                Monitored Anesthesia Care Complications:            No immediate complications. Estimated Blood Loss:     Estimated blood loss was minimal. Procedure:                Pre-Anesthesia Assessment:                           - Prior to the procedure, a History and Physical                            was performed, and patient medications and                            allergies were reviewed. The patient's tolerance of                            previous anesthesia was also reviewed. The risks                            and benefits of the procedure and the sedation                            options and risks were discussed with the patient.                            All questions were answered, and informed consent                            was obtained. Prior Anticoagulants: The patient has                            taken no anticoagulant or antiplatelet agents. ASA                            Grade Assessment: III - A patient with severe                            systemic disease. After reviewing the risks and  benefits, the patient was deemed in satisfactory                            condition to undergo the procedure.                           After obtaining informed consent, the endoscope was                            passed under direct vision. Throughout the                            procedure, the patient's blood pressure, pulse, and                             oxygen saturations were monitored continuously. The                            GIF-[id number] ([id number]) Olympus endoscope was introduced                            through the mouth, and advanced to the second part                            of duodenum. The upper GI endoscopy was                            accomplished without difficulty. The patient                            tolerated the procedure well. Scope In: Scope Out: Findings:      A non-obstructing Schatzki ring was found at the gastroesophageal       junction.      Localized inflammation with hematin characterized by congestion (edema),       erythema, friability, granularity and shallow ulcerations was found in       the gastric body. Biopsies were taken with a cold forceps for histology.      The examined duodenum was normal. Impression:               - Non-obstructing Schatzki ring.                           - Gastritis with hemorrhage. Biopsied.                           - Normal examined duodenum. Recommendation:           - Await pathology results.                           - Use a proton pump inhibitor PO BID for 8 weeks.                           - Perform a colonoscopy today. Procedure Code(s):        --- Professional ---  76283, Esophagogastroduodenoscopy, flexible,                            transoral; with biopsy, single or multiple Diagnosis Code(s):        --- Professional ---                           K22.2, Esophageal obstruction                           K29.71, Gastritis, unspecified, with bleeding                           D50.9, Iron deficiency anemia, unspecified CPT copyright 2022 American Medical Association. All rights reserved. The codes documented in this report are preliminary and upon coder review may  be revised to meet current compliance requirements. Dr Georgian Co "Lyndee Leo" Lorenso Courier,  08/17/2022 10:44:42 AM Number of Addenda: 0

## 2022-08-17 NOTE — NC FL2 (Signed)
[place] LEVEL OF CARE FORM     IDENTIFICATION  Patient Name: JANITA CAMBEROS Birthdate: [birth year] Sex: female Admission Date (Current Location): 08/13/2022  [facility] and [place] Number:  Herbalist and Address:         Provider Number: [id number]  Attending Physician Name and Address:  Shelly Coss, MD  Relative Name and Phone Number:       Current Level of Care: Hospital Recommended Level of Care: [place] Prior Approval Number:    Date Approved/Denied:   PASRR Number: [id number]  Discharge Plan: SNF    Current Diagnoses: Patient Active Problem List   Diagnosis Date Noted   Abnormal CT of the abdomen 08/16/2022   Abdominal pain 08/15/2022   Hyperkalemia 08/14/2022   Anemia 08/14/2022   Elevated troponin 08/14/2022   Thrombocytopenia ([place]) 08/14/2022   Acute renal failure (ARF) ([place]) 08/14/2022   S/P AKA (above knee amputation) ([place]) 08/14/2022   Protein-calorie malnutrition, severe 03/28/2022   Acute respiratory failure with hypoxia ([place])    Multiple rib fractures 03/22/2022   Acute renal failure ([place])    Failure to attend appointment with reason given 11/09/2021   Exudative age-related macular degeneration of left eye with active choroidal neovascularization ([place]) 05/01/2021   Deep retinal hemorrhage, left 05/01/2021   High risk social situation 04/25/2021   CAD (coronary artery disease) 04/25/2021   Aortic atherosclerosis ([place]) 04/25/2021   Vertebral fracture, osteoporotic ([place]) 01/22/2021   ILD (interstitial lung disease) ([place]) 11/28/2020   Controlled type 2 diabetes mellitus with stable proliferative retinopathy of both eyes, with long-term current use of insulin ([place]) 08/07/2020   Pseudophakia of both eyes 08/07/2020   At high risk for pressure injury of skin 12/16/2019   Uncontrolled type 2 diabetes mellitus with hyperglycemia ([place]) 11/12/2019   S/P AKA (above knee amputation) bilateral ([place]) 11/12/2019    Food insecurity 11/12/2019   Wound infection    Moderate protein-calorie malnutrition ([place])    Left breast mass 06/02/2019   Stress 06/02/2019   Peripheral vascular disease ([place]) 07/22/2018   Dyspnea 03/23/2018   Impaired functional mobility, balance, gait, and endurance 03/02/2018   Abnormality of gait 02/26/2018   Iron deficiency anemia 03/19/2017   Vision disturbance 03/20/2015   Memory difficulty 03/20/2015   At high risk for falls 11/10/2012   DIABETIC  RETINOPATHY 03/17/2007   Type 2 diabetes mellitus with diabetic neuropathy, unspecified ([place]) 03/17/2007   Hypothyroidism 10/16/2006   Type 2 diabetes mellitus ([place]) 10/16/2006   OBESITY, NOS 10/16/2006   HYPERTENSION, BENIGN SYSTEMIC 10/16/2006   GASTROESOPHAGEAL REFLUX, NO ESOPHAGITIS 10/16/2006   Rheumatoid arthritis ([place]) 10/16/2006   Disorder of bone and cartilage 10/16/2006   INCONTINENCE, URGE 10/16/2006    Orientation RESPIRATION BLADDER Height & Weight     Self, Time, Situation, Place  Normal   Weight: 84 lb (38.1 kg) Height:  5' 2" (157.5 cm)  BEHAVIORAL SYMPTOMS/MOOD NEUROLOGICAL BOWEL NUTRITION STATUS      Incontinent Diet (See dc summary)  AMBULATORY STATUS COMMUNICATION OF NEEDS Skin   Extensive Assist Verbally Normal                       Personal Care Assistance Level of Assistance  Bathing, Feeding, Dressing Bathing Assistance: Maximum assistance Feeding assistance: Limited assistance Dressing Assistance: Maximum assistance     Functional Limitations Info  Sight, Hearing, Speech Sight Info: Impaired Hearing Info: Adequate Speech Info: Adequate    SPECIAL CARE FACTORS FREQUENCY  PT (By licensed PT), OT (By licensed OT)     PT Frequency: 5xweek OT Frequency: 5xweek            Contractures Contractures Info: Not present    Additional Factors Info  Code Status, Allergies Code Status Info: Full Allergies Info: Feraheme (Ferumoxytol)  Rosiglitazone Maleate           Current  Medications (08/17/2022):  This is the current hospital active medication list Current Facility-Administered Medications  Medication Dose Route Frequency Provider Last Rate Last Admin   0.9 %  sodium chloride infusion   Intravenous Continuous Eulogio Bear U, DO   Stopped at 08/16/22 1728   acetaminophen (TYLENOL) tablet 650 mg  650 mg Oral Q6H PRN Irene Pap N, DO   650 mg at 08/17/22 0247   bisacodyl (DULCOLAX) EC tablet 10 mg  10 mg Oral Once Noralyn Pick, NP       cefTRIAXone (ROCEPHIN) 1 g in sodium chloride 0.9 % 100 mL IVPB  1 g Intravenous Q24H Vann, Jessica U, DO   Stopped at 22/02/54 2706   folic acid (FOLVITE) tablet 1 mg  1 mg Oral Daily Vann, Jessica U, DO   1 mg at 08/16/22 0911   ipratropium-albuterol (DUONEB) 0.5-2.5 (3) MG/3ML nebulizer solution 3 mL  3 mL Nebulization Q6H PRN Eulogio Bear U, DO       levothyroxine (SYNTHROID) tablet 100 mcg  100 mcg Oral QAC breakfast Eulogio Bear U, DO   100 mcg at 08/16/22 0910   pantoprazole (PROTONIX) EC tablet 40 mg  40 mg Oral BID AC Sharyn Creamer, MD       peg 3350 powder (MOVIPREP) kit 100 g  0.5 kit Oral Once Vann, Jessica U, DO       predniSONE (DELTASONE) tablet 5 mg  5 mg Oral Q breakfast Vann, Jessica U, DO   5 mg at 08/16/22 0911     Discharge Medications: Please see discharge summary for a list of discharge medications.  Relevant Imaging Results:  Relevant Lab Results:   Additional Information SSN:[id number]  Beckey Rutter, MSW, LCSWA, LCASA Transitions of Care  Clinical Social Worker I

## 2022-08-17 NOTE — Anesthesia Preprocedure Evaluation (Addendum)
Anesthesia Evaluation  Patient identified by MRN, date of birth, ID band Patient awake    Reviewed: Allergy & Precautions, NPO status , Patient's Chart, lab work & pertinent test results  Airway Mallampati: II  TM Distance: >3 FB Neck ROM: Full    Dental  (+) Dental Advisory Given   Pulmonary shortness of breath, pneumonia   breath sounds clear to auscultation       Cardiovascular hypertension, Pt. on medications + CAD and + Peripheral Vascular Disease  + Valvular Problems/Murmurs (Mild MS on 2019 Echo. Normal EF)  Rhythm:Regular Rate:Normal     Neuro/Psych negative neurological ROS     GI/Hepatic Neg liver ROS,GERD  ,,  Endo/Other  diabetes, Type 2Hypothyroidism    Renal/GU ARFRenal disease     Musculoskeletal  (+) Arthritis ,    Abdominal   Peds  Hematology  (+) Blood dyscrasia, anemia   Anesthesia Other Findings   Reproductive/Obstetrics                             Anesthesia Physical Anesthesia Plan  ASA: 4  Anesthesia Plan: MAC   Post-op Pain Management:    Induction:   PONV Risk Score and Plan: 2 and Propofol infusion  Airway Management Planned: Natural Airway and Nasal Cannula  Additional Equipment:   Intra-op Plan:   Post-operative Plan:   Informed Consent: I have reviewed the patients History and Physical, chart, labs and discussed the procedure including the risks, benefits and alternatives for the proposed anesthesia with the patient or authorized representative who has indicated his/her understanding and acceptance.       Plan Discussed with:   Anesthesia Plan Comments:        Anesthesia Quick Evaluation

## 2022-08-18 ENCOUNTER — Encounter (HOSPITAL_COMMUNITY): Payer: Self-pay | Admitting: Internal Medicine

## 2022-08-18 DIAGNOSIS — N179 Acute kidney failure, unspecified: Secondary | ICD-10-CM | POA: Diagnosis not present

## 2022-08-18 LAB — TYPE AND SCREEN
ABO/RH(D): A POS
Antibody Screen: NEGATIVE
Unit division: 0

## 2022-08-18 LAB — CBC
HCT: 22.2 % — ABNORMAL LOW (ref 36.0–46.0)
Hemoglobin: 6.8 g/dL — CL (ref 12.0–15.0)
MCH: 28.5 pg (ref 26.0–34.0)
MCHC: 30.6 g/dL (ref 30.0–36.0)
MCV: 92.9 fL (ref 80.0–100.0)
Platelets: 210 10*3/uL (ref 150–400)
RBC: 2.39 MIL/uL — ABNORMAL LOW (ref 3.87–5.11)
RDW: 13.8 % (ref 11.5–15.5)
WBC: 4.9 10*3/uL (ref 4.0–10.5)
nRBC: 0 % (ref 0.0–0.2)

## 2022-08-18 LAB — BPAM RBC
Blood Product Expiration Date: 202401172359
Unit Type and Rh: 6200

## 2022-08-18 LAB — PREPARE RBC (CROSSMATCH)

## 2022-08-18 LAB — HEMOGLOBIN AND HEMATOCRIT, BLOOD
HCT: 29.9 % — ABNORMAL LOW (ref 36.0–46.0)
Hemoglobin: 9.4 g/dL — ABNORMAL LOW (ref 12.0–15.0)

## 2022-08-18 LAB — BASIC METABOLIC PANEL
Anion gap: 8 (ref 5–15)
BUN: 36 mg/dL — ABNORMAL HIGH (ref 8–23)
CO2: 18 mmol/L — ABNORMAL LOW (ref 22–32)
Calcium: 8 mg/dL — ABNORMAL LOW (ref 8.9–10.3)
Chloride: 112 mmol/L — ABNORMAL HIGH (ref 98–111)
Creatinine, Ser: 1.25 mg/dL — ABNORMAL HIGH (ref 0.44–1.00)
GFR, Estimated: 45 mL/min — ABNORMAL LOW (ref >60)
Glucose, Bld: 144 mg/dL — ABNORMAL HIGH (ref 70–99)
Potassium: 4.4 mmol/L (ref 3.5–5.1)
Sodium: 138 mmol/L (ref 135–145)

## 2022-08-18 MED ORDER — SODIUM CHLORIDE 0.9% IV SOLUTION: Freq: Once | INTRAVENOUS | Status: DC

## 2022-08-18 MED ORDER — FERROUS SULFATE 325 (65 FE) MG PO TABS
325.0000 mg | ORAL_TABLET | Freq: Every day | ORAL | Status: DC
  Administered 2022-08-19: 325 mg via ORAL
  Filled 2022-08-18: qty 1

## 2022-08-18 MED ORDER — SODIUM CHLORIDE 0.9% IV SOLUTION: Freq: Once | INTRAVENOUS | Status: AC

## 2022-08-18 MED ORDER — ROSUVASTATIN CALCIUM 20 MG PO TABS
20.0000 mg | ORAL_TABLET | Freq: Every day | ORAL | Status: DC
  Administered 2022-08-18: 20 mg via ORAL
  Filled 2022-08-18: qty 1

## 2022-08-18 NOTE — Progress Notes (Signed)
PROGRESS NOTE  [name]  FQH:[id number] DOB: 07-12-1946 DOA: 08/13/2022 PCP: Janifer Adie, MD   Brief Narrative: Patient is a 76 year old female with history of hypertension, interstitial lung disease, type 2 diabetes, peripheral artery disease, status post bilateral AKA wheelchair-bound ,RA who presented from skilled nursing facility for the evaluation of abnormal labs.  No history of vomiting, diarrhea, eating/drinking well.  She had noticed that her stool and urine were dark.  On presentation, creatinine was 2.8, potassium was 6.5, hemoglobin 7.7.  CT abdomen suspected showed consolidation within the posterior RIGHT LOWER lobe, consistent with pneumonia,chronic honeycomb changes at the lung bases,focal area of thickening at a haustral fold in the ascending colon, raising the question of circumferential mass.  GI consulted. Underwent EGD/colonoscopy on 12/30.  Hemoglobin dropped to the range of 6 today, needing a blood transfusion.  Plan for discharge back to SNF if hemoglobin remains stable.  Assessment & Plan:  Principal Problem:   Acute renal failure (ARF) (HCC) Active Problems:   Hypothyroidism   Type 2 diabetes mellitus (HCC)   Hyperkalemia   Anemia   Elevated troponin   Thrombocytopenia (HCC)   S/P AKA (above knee amputation) (HCC)   Abdominal pain   Abnormal CT of the abdomen  Acute renal failure: Unclear etiology.  No history of NSAIDs use.  Renal ultrasound was consistent with medical renal disease.  Renal function is improved with IV fluids.  Avoid nephrotoxins.  Suspected Colon mass: CT scan showed focal area of thickening at a haustral fold in the ascending colon, raising the question of circumferential mass.  GI consulted.  S/P  EGD/colonoscopy on 12/30. EGD showed gastritis with gastritis ulcers that had signs of recent bleeding. Take gastric biopsies to evaluate for H pylori. Colonoscopy with removal of 3 colon polyps. No signs of cancer or mass.GI recommended  PPI BID for 8 weeks.GI will arrange for clinic follow up.   History of recent pneumonia: Was started on ceftriaxone as an outpatient.  CT showed right lower lobe pneumonia.  Currently she is symptomatically improved, on room air.  No leukocytosis or fever.  Completed course of ceftriaxone  Normocytic anemia/acute blood loss anemia:  Iron studies showed low folate, currently being replaced.  Iron level was normal.  On iron supplement at home.  Hemoglobin dropped to the range of 6 today.  No frank hematochezia .  Patient states she had black stools few days ago. Transfused with 1 unit of PRBC today.  Check CBC tomorrow  Hyperkalemia: Secondary to acute renal failure.  Given Lokelma.  Resolved  Thrombocytopenia: Resolved  Elevated troponin: No chest pain.  Might be secondary to supply/demand ischemia secondary to acute renal failure.  No ischemic change in the EKG  Type 2 diabetes: Last A1c of 6.2.  Well-controlled.  Currently on sliding scale insulin  Hypothyroidism: Continue Synthyroid  History of RA: Prednisone          DVT prophylaxis:SCD     Code Status: Full Code  Family Communication:: Discussed with grandson on phone on 12/31  Patient status:Inpatient   Patient is from :SNF  Anticipated discharge to:SNF  Estimated DC date:tomorrow   Consultants: GI  Procedures:EGD/colonoscopy  Antimicrobials:  Anti-infectives (From admission, onward)    Start     Dose/Rate Route Frequency Ordered Stop   08/14/22 1715  cefTRIAXone (ROCEPHIN) 1 g in sodium chloride 0.9 % 100 mL IVPB        1 g 200 mL/hr over 30 Minutes Intravenous Every 24 hours 08/14/22  1627 08/17/22 1806       Subjective:  Patient seen and examined at bedside today.  Hemodynamically stable.  Eating her breakfast.  Denies abdomen pain, nausea or vomiting.  She denies any new hematochezia or melena but states she had black stool few days ago  Objective: Vitals:   08/18/22 0804 08/18/22 0952 08/18/22  1012 08/18/22 1315  BP: 125/64 133/61 134/64 (!) 163/77  Pulse: 78 87 83 91  Resp:  _0 Temp: 97.7 F (36.5 C) 98.4 F (36.9 C) 98.5 F (36.9 C) 98.8 F (37.1 C)  TempSrc: Oral Oral Oral Oral  SpO2: 100% 99% 99%   Weight:      Height:        Intake/Output Summary (Last 24 hours) at 08/18/2022 1421 Last data filed at 08/18/2022 1315 Gross per 24 hour  Intake 654 ml  Output 700 ml  Net -46 ml   Filed Weights   08/14/22 0100  Weight: 38.1 kg    Examination:   General exam: Overall comfortable, not in distress, very deconditioned, chronically ill looking, weak HEENT: PERRL Respiratory system:  no wheezes or crackles  Cardiovascular system: S1 & S2 heard, RRR.  Gastrointestinal system: Abdomen is nondistended, soft and nontender. Central nervous system: Alert and oriented Extremities: Bilateral AKA, deformity of hands from RA Skin: No rashes, no ulcers,no icterus     Data Reviewed: I have personally reviewed following labs and imaging studies  CBC: Recent Labs  Lab 08/13/22 2215 08/13/22 2221 08/14/22 0556 08/14/22 1607 08/15/22 0346 08/16/22 0406 08/17/22 0354 08/18/22 0156  WBC 7.4  --  6.2  --  5.8 5.7 5.3 4.9  NEUTROABS 5.4  --   --   --   --   --   --   --   HGB 7.7*   < > 6.6* 7.2* 7.6* 7.6* 7.2* 6.8*  HCT 25.1*   < > 22.9* 24.3* 26.3* 24.4* 25.0* 22.2*  MCV 96.2  --  98.3  --  95.6 92.8 99.2 92.9  PLT 62*  --  254  --  233 192 205 210   < > = values in this interval not displayed.   Basic Metabolic Panel: Recent Labs  Lab 08/13/22 2215 08/13/22 2221 08/14/22 1607 08/15/22 0346 08/16/22 0406 08/17/22 0354 08/18/22 0156  NA 134*   < > 133* 134* 136 136 138  K 6.5*   < > 5.2* 5.4* 5.2* 4.5 4.4  CL 109   < > 110 113* 115* 115* 112*  CO2 15*   < > 16* 17* 17* 14* 18*  GLUCOSE 144*   < > 126* 91 80 76 144*  BUN 94*   < > 78* 72* 55* 41* 36*  CREATININE 2.89*   < > 2.36* 2.09* 1.62* 1.33* 1.25*  CALCIUM 8.2*   < > 7.8* 8.1* 8.1* 7.9* 8.0*   MG 1.9  --   --   --   --   --   --    < > = values in this interval not displayed.     Recent Results (from the past 240 hour(s))  MRSA Next Gen by PCR, Nasal     Status: Abnormal   Collection Time: 08/16/22 10:30 AM   Specimen: Nasal Mucosa; Nasal Swab  Result Value Ref Range Status   MRSA by PCR Next Gen DETECTED (A) NOT DETECTED Final    Comment: [facility] NOTIFIED BY HEATHER GUILLAUME RN ON 12.29.23 ON 1402 BY EM (NOTE) The  GeneXpert MRSA Assay (FDA approved for NASAL specimens only), is one component of a comprehensive MRSA colonization surveillance program. It is not intended to diagnose MRSA infection nor to guide or monitor treatment for MRSA infections. Test performance is not FDA approved in patients less than 26 years old. Performed at [facility], [place] [address], [place], [place] [address]      Radiology Studies: No results found.  Scheduled Meds:  sodium chloride   Intravenous Once   bisacodyl  10 mg Oral Once   [START ON 08/19/2022] ferrous sulfate  325 mg Oral Q breakfast   folic acid  1 mg Oral Daily   levothyroxine  100 mcg Oral QAC breakfast   pantoprazole  40 mg Oral BID AC   peg 3350 powder  0.5 kit Oral Once   predniSONE  5 mg Oral Q breakfast   rosuvastatin  20 mg Oral Daily   Continuous Infusions:     LOS: 4 days   Shelly Coss, MD [facility] P12/31/2023, 2:21 PM

## 2022-08-18 NOTE — Progress Notes (Signed)
Per MD, plan for dc back to SNF tomorrow. Spoke to [place] at [facility] who confirmed pt able to return tomorrow. SW will follow and assist as indicated.   Wandra Feinstein, MSW, LCSW [phone] (coverage)

## 2022-08-18 NOTE — Progress Notes (Signed)
Patient had a low Hgb level of 6.8. Reached out to MD on call, 1 Unit of PRBC ordered, waiting on Type and cross results.

## 2022-08-19 DIAGNOSIS — N179 Acute kidney failure, unspecified: Secondary | ICD-10-CM | POA: Diagnosis not present

## 2022-08-19 LAB — BASIC METABOLIC PANEL
Anion gap: 8 (ref 5–15)
BUN: 21 mg/dL (ref 8–23)
CO2: 17 mmol/L — ABNORMAL LOW (ref 22–32)
Calcium: 7.9 mg/dL — ABNORMAL LOW (ref 8.9–10.3)
Chloride: 113 mmol/L — ABNORMAL HIGH (ref 98–111)
Creatinine, Ser: 0.99 mg/dL (ref 0.44–1.00)
GFR, Estimated: 59 mL/min — ABNORMAL LOW (ref >60)
Glucose, Bld: 92 mg/dL (ref 70–99)
Potassium: 4.8 mmol/L (ref 3.5–5.1)
Sodium: 138 mmol/L (ref 135–145)

## 2022-08-19 LAB — BPAM RBC
Blood Product Expiration Date: 202401172359
ISSUE DATE / TIME: 202312310938
Unit Type and Rh: 6200

## 2022-08-19 LAB — TYPE AND SCREEN
ABO/RH(D): A POS
Antibody Screen: NEGATIVE
Unit division: 0

## 2022-08-19 LAB — CBC
HCT: 28.8 % — ABNORMAL LOW (ref 36.0–46.0)
Hemoglobin: 9.4 g/dL — ABNORMAL LOW (ref 12.0–15.0)
MCH: 30 pg (ref 26.0–34.0)
MCHC: 32.6 g/dL (ref 30.0–36.0)
MCV: 92 fL (ref 80.0–100.0)
Platelets: 204 10*3/uL (ref 150–400)
RBC: 3.13 MIL/uL — ABNORMAL LOW (ref 3.87–5.11)
RDW: 14.2 % (ref 11.5–15.5)
WBC: 5.6 10*3/uL (ref 4.0–10.5)
nRBC: 0 % (ref 0.0–0.2)

## 2022-08-19 MED ORDER — PANTOPRAZOLE SODIUM 40 MG PO TBEC: 40.0000 mg | DELAYED_RELEASE_TABLET | Freq: Two times a day (BID) | ORAL | 0 refills | Status: AC

## 2022-08-19 MED ORDER — SODIUM BICARBONATE 650 MG PO TABS: 650.0000 mg | ORAL_TABLET | Freq: Three times a day (TID) | ORAL | 0 refills | Status: AC

## 2022-08-19 MED ORDER — SODIUM BICARBONATE 650 MG PO TABS
650.0000 mg | ORAL_TABLET | Freq: Every day | ORAL | Status: DC
  Administered 2022-08-19: 650 mg via ORAL
  Filled 2022-08-19: qty 1

## 2022-08-19 MED ORDER — FOLIC ACID 1 MG PO TABS: 1.0000 mg | ORAL_TABLET | Freq: Every day | ORAL | Status: DC

## 2022-08-19 MED ORDER — SODIUM BICARBONATE 650 MG PO TABS: 650.0000 mg | ORAL_TABLET | Freq: Every day | ORAL | Status: DC

## 2022-08-19 NOTE — TOC Transition Note (Signed)
Transition of Care [facility]) - CM/SW Discharge Note   Patient Details  Name: [name] MRN: [id number] Date of Birth: [birth year]  [facility] CM/SW Contact:  Curlene Labrum, RN Phone Number: 08/19/2022, 9:56 AM   Clinical Narrative:    CM spoke with attending physician and patient is medically stable to discharge back to [facility] today.  I called Nikki, CM at [facility] and she has an available bed for the patient.  I called [facility] and spoke with the on-call RN and she will follow up regarding patient's mode of transportation back to the facility.  The patient's WC is presently at [facility].   Discharge summary and discharge orders uploaded in the hub.  [facility] is coordinating [facility] transport at this time and will pick the patient up by [facility] transport and deliver to the facility - transport time pending at this time.  [facility] nursing - please call report to [place] at [phone].     Barriers to Discharge: Continued Medical Work up   Patient Goals and CMS Choice CMS Medicare.gov Compare Post Acute Care list provided to:: Patient Choice offered to / list presented to : Patient  Discharge Placement                         Discharge Plan and Services Additional resources added to the After Visit Summary for   In-house Referral: Clinical Social Work   Post Acute Care Choice: [place]                               Social Determinants of Health ([place]) Interventions SDOH Screenings   Transportation Needs: No Transportation Needs (08/14/2022)  Depression (PHQ2-9): Low Risk  (03/08/2021)  Tobacco Use: Low Risk  (08/18/2022)     Readmission Risk Interventions     No data to display

## 2022-08-19 NOTE — Discharge Summary (Signed)
Physician Discharge Summary  ANNYE FORREY YTK:[id number] DOB: March 05, 1946 DOA: 08/13/2022  PCP: Janifer Adie, MD  Admit date: 08/13/2022 Discharge date: 08/19/2022  Admitted From: SNF Disposition:  SNF  Discharge Condition:Stable CODE STATUS:FULL Diet recommendation: Dysphagia 2 diet  Brief/Interim Summary:  Patient is a 77 year old female with history of hypertension, interstitial lung disease, type 2 diabetes, peripheral artery disease, status post bilateral AKA wheelchair-bound ,RA who presented from skilled nursing facility for the evaluation of abnormal labs.  No history of vomiting, diarrhea, eating/drinking well.  She had noticed that her stool and urine were dark.  On presentation, creatinine was 2.8, potassium was 6.5, hemoglobin 7.7.  CT abdomen suspected showed consolidation within the posterior RIGHT LOWER lobe, consistent with pneumonia,chronic honeycomb changes at the lung bases,focal area of thickening at a haustral fold in the ascending colon, raising the question of circumferential mass.  GI consulted. Underwent EGD/colonoscopy on 12/30 with finding of gastritis, ulcers.  She was given a unit of blood transfusion during this hospitalization.  Plan for discharge back to SNF today.  Following problems were addressed during hospitalization:  Acute renal failure: resolved   Suspected Colon mass: CT scan showed focal area of thickening at a haustral fold in the ascending colon, raising the question of circumferential mass.  GI consulted.  S/P  EGD/colonoscopy on 12/30. EGD showed gastritis with gastritis ulcers that had signs of recent bleeding. Take gastric biopsies to evaluate for H pylori. Colonoscopy with removal of 3 colon polyps. No signs of cancer or mass.GI recommended PPI BID for 8 weeks.GI will arrange for clinic follow up.    History of recent pneumonia: Was started on ceftriaxone as an outpatient.  CT showed right lower lobe pneumonia.  Currently she is  symptomatically improved, on room air.  No leukocytosis or fever.  Completed course of ceftriaxone   Normocytic anemia/acute blood loss anemia:  Iron studies showed low folate, currently being replaced.  Iron level was normal.  On iron supplement at home.  Hemoglobin dropped to the range of 6 today.  No frank hematochezia .  Patient states she had black stools few days ago. Transfused with 1 unit of PRBC, hemoglobin stable in the range of 9.  Check CBC in a week   Hyperkalemia: Secondary to acute renal failure.  Given Lokelma.  Resolved   Thrombocytopenia: Resolved   Elevated troponin: No chest pain.  Might be secondary to supply/demand ischemia secondary to acute renal failure.  No ischemic change in the EKG   Hypothyroidism: Continue Synthyroid   History of RA: Prednisone      Discharge Diagnoses:  Principal Problem:   Acute renal failure (ARF) (HCC) Active Problems:   Hypothyroidism   Type 2 diabetes mellitus (HCC)   Hyperkalemia   Anemia   Elevated troponin   Thrombocytopenia (HCC)   S/P AKA (above knee amputation) (HCC)   Abdominal pain   Abnormal CT of the abdomen    Discharge Instructions  Discharge Instructions     Diet general   Complete by: As directed    Dysphagia 2 diet   Discharge instructions   Complete by: As directed    1)Please take prescribed medications as instructed 2) Do a CBC test in a week to check your hemoglobin 3)You will be called by gastroenterology for follow-up appointment   Increase activity slowly   Complete by: As directed       Allergies as of 08/19/2022       Reactions   Feraheme [ferumoxytol] Itching  Rosiglitazone Maleate Other (See Comments)   REACTION: Difficulty walking, Fatigue, shortness of breath Avandia        Medication List     STOP taking these medications    cefTRIAXone 1 g injection Commonly known as: ROCEPHIN   doxycycline 100 MG tablet Commonly known as: ADOXA       TAKE these medications     acetaminophen 650 MG CR tablet Commonly known as: TYLENOL Take 650 mg by mouth 3 (three) times daily.   ACIDOPHILUS/PECTIN PO Take 1 capsule by mouth daily. for 10 days   amLODipine 10 MG tablet Commonly known as: NORVASC Take 1 tablet (10 mg total) by mouth at bedtime. What changed: when to take this   CORICIDIN HBP CONGESTION/COUGH PO Take 2 capsules by mouth every 6 (six) hours.   ferrous sulfate 325 (65 FE) MG tablet Take 325 mg by mouth daily with breakfast.   fluticasone 50 MCG/ACT nasal spray Commonly known as: FLONASE Place 1 spray into both nostrils daily.   folic acid 1 MG tablet Commonly known as: FOLVITE Take 1 tablet (1 mg total) by mouth daily. Start taking on: August 20, 2022   gabapentin 100 MG capsule Commonly known as: NEURONTIN Take 100 mg by mouth at bedtime.   Incontinence Brief Medium Misc Wear as needed for incontinence   ipratropium-albuterol 0.5-2.5 (3) MG/3ML Soln Commonly known as: DUONEB Take 3 mLs by nebulization in the morning and at bedtime. For 7 days Also given one vial every 6 hours as needed for shortness of breath and wheezing   levothyroxine 100 MCG tablet Commonly known as: SYNTHROID Take 100 mcg by mouth daily before breakfast.   lidocaine 5 % Commonly known as: LIDODERM Place 1 patch onto the skin daily. Remove & Discard patch within 12 hours or as directed by MD   menthol-cetylpyridinium 3 MG lozenge Commonly known as: CEPACOL Take 1 lozenge by mouth every 2 (two) hours as needed for sore throat.   Misc. Devices Misc Mepilex silver sulfate-foam bandage 6x6" change dressing weekly   nystatin powder Commonly known as: MYCOSTATIN/NYSTOP Apply 1 Application topically 2 (two) times daily. Apply to areas under breasts twice daily to treat yeast   ondansetron 4 MG tablet Commonly known as: ZOFRAN Take 4 mg by mouth every 6 (six) hours as needed for nausea or vomiting.   pantoprazole 40 MG tablet Commonly known as:  PROTONIX Take 1 tablet (40 mg total) by mouth 2 (two) times daily. What changed:  how much to take when to take this   polyethylene glycol 17 g packet Commonly known as: MIRALAX / GLYCOLAX Take 17 g by mouth daily as needed for mild constipation.   predniSONE 5 MG tablet Commonly known as: DELTASONE Take 5 mg by mouth daily with breakfast.   ProAir HFA 108 (90 Base) MCG/ACT inhaler Generic drug: albuterol INHALE 2 PUFFS INTO THE LUNGS EVERY 6 HOURS AS NEEDED FOR WHEEZING OR SHORTNESS OF BREATH What changed:  how much to take how to take this when to take this reasons to take this additional instructions   rosuvastatin 20 MG tablet Commonly known as: CRESTOR Take 1 tablet (20 mg total) by mouth daily. What changed: when to take this   sodium bicarbonate 650 MG tablet Take 1 tablet (650 mg total) by mouth 3 (three) times daily for 3 days.   Vitamin D3 Super Strength 50 MCG (2000 UT) Tabs Generic drug: Cholecalciferol Take 2,000 Units by mouth daily.  Follow-up Information     Janifer Adie, MD. Schedule an appointment as soon as possible for a visit in 1 week(s).   Specialty: Family Medicine Contact information: [place] [address] [phone]                Allergies  Allergen Reactions   Feraheme [Ferumoxytol] Itching   Rosiglitazone Maleate Other (See Comments)    REACTION: Difficulty walking, Fatigue, shortness of breath Avandia    Consultations: GI   Procedures/Studies: CT ABDOMEN PELVIS WO CONTRAST  Result Date: 08/15/2022 CLINICAL DATA:  RIGHT LOWER QUADRANT abdominal pain. EXAM: CT ABDOMEN AND PELVIS WITHOUT CONTRAST TECHNIQUE: Multidetector CT imaging of the abdomen and pelvis was performed following the standard protocol without IV contrast. RADIATION DOSE REDUCTION: This exam was performed according to the departmental dose-optimization program which includes automated exposure control, adjustment of the mA  and/or kV according to patient size and/or use of iterative reconstruction technique. COMPARISON:  Renal ultrasound 08/14/2022 FINDINGS: Lower chest: Headache, changes are identified at the lung bases. There is partially imaged consolidation within the posterior RIGHT LOWER lobe. There is significant atherosclerotic calcification of coronary arteries. Heart size is normal. No pericardial effusion. Hepatobiliary: Prior cholecystectomy. Normal appearance of the liver. Pancreas: Unremarkable. No pancreatic ductal dilatation or surrounding inflammatory changes. Spleen: Normal in size without focal abnormality. Adrenals/Urinary Tract: Adrenal glands are normal. There are bilateral renal artery calcifications. Intrarenal stone in the LOWER pole the RIGHT kidney measures 5 millimeters. There is no hydronephrosis. RIGHT ureter is unremarkable. A 1 millimeter intrarenal calculus is identified in the LOWER pole of the LEFT kidney. No hydronephrosis. LEFT ureter is unremarkable. The urinary bladder is distended and otherwise normal in appearance. Stomach/Bowel: Stomach and small bowel loops are normal in appearance. The appendix is not well seen. There are scattered colonic diverticula but no associated inflammatory changes. Within the ascending colon, there is a focal area of thickening at a haustral fold, raising the question of circumferential mass. Findings are best seen on coronal and sagittal images (image 76 of series 6, image 42 of series 7). Vascular/Lymphatic: There is extensive atherosclerotic calcification of the abdominal and pelvic arteries. No evidence for abdominal adenopathy. Reproductive: Uterus is present. No adnexal mass. No free pelvic fluid. Other: Patient is cachectic.  Diffuse body wall edema noted. Musculoskeletal: No acute fractures or subluxations. Remote inferior endplate fracture of L4. Remote superior endplate fracture of P95. IMPRESSION: 1. Partially imaged consolidation within the posterior  RIGHT LOWER lobe, consistent with pneumonia. Chronic honeycomb changes at the lung bases. 2. Focal area of thickening at a haustral fold in the ascending colon, raising the question of circumferential mass. Recommend further evaluation with colonoscopy. 3. Colonic diverticulosis without acute diverticulitis. 4. Prior cholecystectomy. 5. Bilateral nonobstructing intrarenal calculi. 6. Diffuse body wall edema. 7. Remote fractures of T12 and L4. These results will be called to the ordering clinician or representative by the Radiologist Assistant, and communication documented in the PACS or [employer]. Electronically Signed   By: Nolon Nations M.D.   On: 08/15/2022 15:22   US RENAL  Result Date: 08/14/2022 CLINICAL DATA:  [id number] with acute renal failure. EXAM: RENAL / URINARY TRACT ULTRASOUND COMPLETE COMPARISON:  Renal ultrasound 06/18/2017. FINDINGS: Right Kidney: Renal measurements: 10.0 x 4.0 x 4.0 cm = volume: 85 mL. Echogenicity slightly increased relative to the liver, new in the interval. No mass or hydronephrosis visualized. There is an 8 mm nonobstructive caliceal stone in the mid to lower pole  of this kidney which was also not seen previously. Left Kidney: Renal measurements: 8.5 x 4.0 x 3.8 cm = volume: 67 mL. Echogenicity slightly increased relative to liver, new in the interval. No mass or hydronephrosis visualized. There are shadowing nonobstructive caliceal stones measuring 10 mm and 5 mm. Bladder: Appears normal thickness for degree of bladder distention. Bilateral ureteral jets are confirmed. Small amount of echogenic layering debris in the left posterior bladder. The patient unable to void for postvoid residual analysis with prevoid bladder volume 261.7 mL. Other: None. IMPRESSION: 1. No hydronephrosis. 2. Bilateral nonobstructive caliceal stones. 3. Slightly increased echogenicity of the kidneys suggests medical renal disease. 4. Small amount of layering debris in the left posterior  bladder. Correlate with urinalysis findings. 5. The patient unable to void for postvoid residual analysis. Electronically Signed   By: Telford Nab M.D.   On: 08/14/2022 02:14   DG Chest Port 1 View  Result Date: 08/13/2022 CLINICAL DATA:  Cough EXAM: PORTABLE CHEST 1 VIEW COMPARISON:  08/07/2022 FINDINGS: Cardiac shadow is stable. Aortic calcifications are again seen. Persistent basilar infiltrates are noted stable from the prior exam. No new focal abnormality is noted. No bony abnormality is seen. IMPRESSION: Stable bibasilar airspace opacities. Electronically Signed   By: Inez Catalina M.D.   On: 08/13/2022 22:20   DG Chest 1 View  Result Date: 08/07/2022 CLINICAL DATA:  Cough. EXAM: CHEST  1 VIEW COMPARISON:  March 27, 2022. FINDINGS: Endotracheal and nasogastric tubes have been removed. Stable cardiomediastinal silhouette. Significant increased bibasilar airspace opacities are noted most consistent with multifocal pneumonia. Old left rib fractures are noted. IMPRESSION: Significantly increased bibasilar opacities are noted most consistent with multifocal pneumonia. Electronically Signed   By: Marijo Conception M.D.   On: 08/07/2022 12:48      Subjective:  Patient seen and examined at bedside today.  Hemodynamically stable.  Comfortable.  Denies any abdomen pain, nausea or vomiting.  Hemoglobin stable.  Medically stable for discharge today.   Discharge Exam: Vitals:   08/18/22 2038 08/19/22 0506  BP: (!) 156/75 (!) 161/87  Pulse: 79 81  Resp: 18 20  Temp: 97.7 F (36.5 C) 98 F (36.7 C)  SpO2: 98% 100%   Vitals:   08/18/22 1315 08/18/22 1557 08/18/22 2038 08/19/22 0506  BP: (!) 163/77 (!) 157/68 (!) 156/75 (!) 161/87  Pulse: 91 88 79 81  Resp: '16  18 20  '$ Temp: 98.8 F (37.1 C) 98 F (36.7 C) 97.7 F (36.5 C) 98 F (36.7 C)  TempSrc: Oral Oral Oral Oral  SpO2:  100% 98% 100%  Weight:      Height:        General: Pt is alert, awake, not in acute  distress Cardiovascular: RRR, S1/S2 +, no rubs, no gallops Respiratory: CTA bilaterally, no wheezing, no rhonchi Abdominal: Soft, NT, ND, bowel sounds + Extremities: no edema, no cyanosis,B/L AKA    The results of significant diagnostics from this hospitalization (including imaging, microbiology, ancillary and laboratory) are listed below for reference.     Microbiology: Recent Results (from the past 240 hour(s))  MRSA Next Gen by PCR, Nasal     Status: Abnormal   Collection Time: 08/16/22 10:30 AM   Specimen: Nasal Mucosa; Nasal Swab  Result Value Ref Range Status   MRSA by PCR Next Gen DETECTED (A) NOT DETECTED Final    Comment: [facility] NOTIFIED BY HEATHER GUILLAUME RN ON 12.29.23 ON 1402 BY EM (NOTE) The GeneXpert MRSA Assay (FDA approved  for NASAL specimens only), is one component of a comprehensive MRSA colonization surveillance program. It is not intended to diagnose MRSA infection nor to guide or monitor treatment for MRSA infections. Test performance is not FDA approved in patients less than 3 years old. Performed at [facility], [place] [address], [place], [place] [address]      Labs: BNP (last 3 results) No results for input(s): "BNP" in the last 8760 hours. Basic Metabolic Panel: Recent Labs  Lab 08/13/22 2215 08/13/22 2221 08/15/22 0346 08/16/22 0406 08/17/22 0354 08/18/22 0156 08/19/22 0236  NA 134*   < > 134* 136 136 138 138  K 6.5*   < > 5.4* 5.2* 4.5 4.4 4.8  CL 109   < > 113* 115* 115* 112* 113*  CO2 15*   < > 17* 17* 14* 18* 17*  GLUCOSE 144*   < > 91 80 76 144* 92  BUN 94*   < > 72* 55* 41* 36* 21  CREATININE 2.89*   < > 2.09* 1.62* 1.33* 1.25* 0.99  CALCIUM 8.2*   < > 8.1* 8.1* 7.9* 8.0* 7.9*  MG 1.9  --   --   --   --   --   --    < > = values in this interval not displayed.   Liver Function Tests: Recent Labs  Lab 08/13/22 2215  AST 15  ALT 5  ALKPHOS 51  BILITOT 1.1  PROT 6.7  ALBUMIN 2.0*   No results for input(s): "LIPASE",  "AMYLASE" in the last 168 hours. No results for input(s): "AMMONIA" in the last 168 hours. CBC: Recent Labs  Lab 08/13/22 2215 08/13/22 2221 08/15/22 0346 08/16/22 0406 08/17/22 0354 08/18/22 0156 08/18/22 1846 08/19/22 0236  WBC 7.4   < > 5.8 5.7 5.3 4.9  --  5.6  NEUTROABS 5.4  --   --   --   --   --   --   --   HGB 7.7*   < > 7.6* 7.6* 7.2* 6.8* 9.4* 9.4*  HCT 25.1*   < > 26.3* 24.4* 25.0* 22.2* 29.9* 28.8*  MCV 96.2   < > 95.6 92.8 99.2 92.9  --  92.0  PLT 62*   < > 233 192 205 210  --  204   < > = values in this interval not displayed.   Cardiac Enzymes: No results for input(s): "CKTOTAL", "CKMB", "CKMBINDEX", "TROPONINI" in the last 168 hours. BNP: Invalid input(s): "POCBNP" CBG: Recent Labs  Lab 08/13/22 2131 08/13/22 2340 08/17/22 1015 08/17/22 1128  GLUCAP 138* 254* 73 71   D-Dimer No results for input(s): "DDIMER" in the last 72 hours. Hgb A1c No results for input(s): "HGBA1C" in the last 72 hours. Lipid Profile No results for input(s): "CHOL", "HDL", "LDLCALC", "TRIG", "CHOLHDL", "LDLDIRECT" in the last 72 hours. Thyroid function studies No results for input(s): "TSH", "T4TOTAL", "T3FREE", "THYROIDAB" in the last 72 hours.  Invalid input(s): "FREET3" Anemia work up No results for input(s): "VITAMINB12", "FOLATE", "FERRITIN", "TIBC", "IRON", "RETICCTPCT" in the last 72 hours. Urinalysis    Component Value Date/Time   COLORURINE YELLOW 08/14/2022 0237   APPEARANCEUR HAZY (A) 08/14/2022 0237   LABSPEC 1.014 08/14/2022 0237   PHURINE 5.0 08/14/2022 0237   GLUCOSEU NEGATIVE 08/14/2022 0237   HGBUR NEGATIVE 08/14/2022 0237   BILIRUBINUR NEGATIVE 08/14/2022 0237   KETONESUR NEGATIVE 08/14/2022 0237   PROTEINUR NEGATIVE 08/14/2022 0237   NITRITE NEGATIVE 08/14/2022 0237   LEUKOCYTESUR SMALL (A) 08/14/2022  0981   Sepsis Labs Recent Labs  Lab 08/16/22 0406 08/17/22 0354 08/18/22 0156 08/19/22 0236  WBC 5.7 5.3 4.9 5.6   Microbiology Recent  Results (from the past 240 hour(s))  MRSA Next Gen by PCR, Nasal     Status: Abnormal   Collection Time: 08/16/22 10:30 AM   Specimen: Nasal Mucosa; Nasal Swab  Result Value Ref Range Status   MRSA by PCR Next Gen DETECTED (A) NOT DETECTED Final    Comment: [facility] NOTIFIED BY HEATHER GUILLAUME RN ON 12.29.23 ON 1402 BY EM (NOTE) The GeneXpert MRSA Assay (FDA approved for NASAL specimens only), is one component of a comprehensive MRSA colonization surveillance program. It is not intended to diagnose MRSA infection nor to guide or monitor treatment for MRSA infections. Test performance is not FDA approved in patients less than 72 years old. Performed at [facility], [place] [address] [place],  [address]     Please note: You were cared for by a hospitalist during your hospital stay. Once you are discharged, your primary care physician will handle any further medical issues. Please note that NO REFILLS for any discharge medications will be authorized once you are discharged, as it is imperative that you return to your primary care physician (or establish a relationship with a primary care physician if you do not have one) for your post hospital discharge needs so that they can reassess your need for medications and monitor your lab values.    Time coordinating discharge: 40 minutes  SIGNED:   Shelly Coss, MD  [facility] 08/19/2022, 10:44 AM Pager [phone]  If 7PM-7AM, please contact night-coverage [email or web address] Password [email or web address]

## 2022-08-20 ENCOUNTER — Telehealth: Payer: Self-pay

## 2022-08-20 LAB — SURGICAL PATHOLOGY

## 2022-08-20 NOTE — Telephone Encounter (Signed)
Pt scheduled to see Dr. Henrene Pastor 09/17/22 at [place] letter mailed to pt.

## 2022-08-20 NOTE — Telephone Encounter (Signed)
-----   Message from Sharyn Creamer, MD sent at 08/17/2022 11:20 AM EST ----- Christie Beckers, please arrange for GI clinic follow up with Dr. Henrene Pastor or APP in 4 weeks for gastric ulcers and IDA. Thanks.

## 2022-08-21 ENCOUNTER — Encounter: Payer: Self-pay | Admitting: Internal Medicine

## 2022-09-17 ENCOUNTER — Ambulatory Visit: Payer: Medicare (Managed Care) | Admitting: Internal Medicine

## 2022-10-02 ENCOUNTER — Encounter (HOSPITAL_BASED_OUTPATIENT_CLINIC_OR_DEPARTMENT_OTHER): Payer: Self-pay | Admitting: Pulmonary Disease

## 2022-10-02 ENCOUNTER — Ambulatory Visit (HOSPITAL_BASED_OUTPATIENT_CLINIC_OR_DEPARTMENT_OTHER): Payer: Medicare (Managed Care) | Admitting: Pulmonary Disease

## 2022-10-02 ENCOUNTER — Ambulatory Visit (HOSPITAL_BASED_OUTPATIENT_CLINIC_OR_DEPARTMENT_OTHER): Payer: Medicare (Managed Care)

## 2022-10-02 VITALS — BP 126/66 | HR 73

## 2022-10-02 DIAGNOSIS — H353221 Exudative age-related macular degeneration, left eye, with active choroidal neovascularization: Secondary | ICD-10-CM | POA: Diagnosis not present

## 2022-10-02 DIAGNOSIS — E113553 Type 2 diabetes mellitus with stable proliferative diabetic retinopathy, bilateral: Secondary | ICD-10-CM

## 2022-10-02 DIAGNOSIS — E440 Moderate protein-calorie malnutrition: Secondary | ICD-10-CM

## 2022-10-02 DIAGNOSIS — M0579 Rheumatoid arthritis with rheumatoid factor of multiple sites without organ or systems involvement: Secondary | ICD-10-CM

## 2022-10-02 DIAGNOSIS — J849 Interstitial pulmonary disease, unspecified: Secondary | ICD-10-CM

## 2022-10-02 DIAGNOSIS — I739 Peripheral vascular disease, unspecified: Secondary | ICD-10-CM

## 2022-10-02 DIAGNOSIS — J84112 Idiopathic pulmonary fibrosis: Secondary | ICD-10-CM | POA: Diagnosis not present

## 2022-10-02 DIAGNOSIS — N183 Chronic kidney disease, stage 3 unspecified: Secondary | ICD-10-CM | POA: Diagnosis not present

## 2022-10-02 DIAGNOSIS — D696 Thrombocytopenia, unspecified: Secondary | ICD-10-CM

## 2022-10-02 DIAGNOSIS — E1152 Type 2 diabetes mellitus with diabetic peripheral angiopathy with gangrene: Secondary | ICD-10-CM

## 2022-10-02 DIAGNOSIS — Z89611 Acquired absence of right leg above knee: Secondary | ICD-10-CM

## 2022-10-02 DIAGNOSIS — Z89612 Acquired absence of left leg above knee: Secondary | ICD-10-CM

## 2022-10-02 NOTE — Progress Notes (Signed)
   Subjective:    Patient ID: [name], female    DOB: 12-Apr-1946, 76 y.o.   MRN: [id number]  HPI  77 yo never smoker for FU of RA-ILD   PMH - erosive rheumatoid arthritis, diagnosed 2011, pos ANA 1:1280, pos sjogrens Bilateral AKA 2021 - Wheelchair bound since  -patulous esophagus noted on CT -macular degeneration.  -DM2  followed by Dr. Estanislado Pandy, on low dose daily prednisone. She is not a good candidate for methotrexate of sulfasalazine due to ILD and elevated creatinine. Opted against immunosuppressants.    -She was hospitalized August 2023 after fall with rib fractures  65-monthfollow-up visit. I have last seen her in 2022. She was hospitalized 07/2022 for AKI, hyperkalemia, hemoglobin of 7.7 with dark stools. She underwent EGD/colonoscopy with finding of gastritis and ulcers and required 1 unit PRBC, 3 colonic polyps were removed. CT abdomen/pelvis showed consolidation right lower lobe with extensive honeycombing both lung bases.  She was treated with ceftriaxone and doxycycline  She has been maintained on 5 mg of prednisone by rheumatology, reviewed their last office visit from 2023.  She is wheelchair-bound and followed by the PACU program.   She denies worsening breathing or coughing. Chest x-ray from 07/2022 reviewed shows bibasilar airspace disease  She lives with 2 grandsons to take care of her   Significant tests/ events reviewed  03/22/2022 CT chest wo contrast: severe fibrotic lung disease with honeycombing at the bases. Airpsace opacity in the RUL. Lateral left fifth through seventh rib fractures. Posterior left seventh and eight rib fractures   HRCT 02/2021 >> UIP, mild progression   CT chest wo con 11/2020 >> Extensive patchy honeycombing and traction bronchiectasis at the left greater than right lung bases, suggestive of fibrotic interstitial lung disease with a UIP pattern - patulous debris-filled esophagus - Patchy consolidation in the dependent left greater  than right lower lobes and lingula ? Aspiration pneumonia - nodular 2.2 cm focus of consolidation in the posterior left upper lobe - T4 and mild superior L1 vertebral compression fractures    Review of Systems neg for any significant sore throat, dysphagia, itching, sneezing, nasal congestion or excess/ purulent secretions, fever, chills, sweats, unintended wt loss, pleuritic or exertional cp, hempoptysis, orthopnea pnd or change in chronic leg swelling. Also denies presyncope, palpitations, heartburn, abdominal pain, nausea, vomiting, diarrhea or change in bowel or urinary habits, dysuria,hematuria, rash, arthralgias, visual complaints, headache, numbness weakness or ataxia.     Objective:   Physical Exam  Gen. Pleasant, kyphotic woman in wheelchair, in no distress ENT - no lesions, no post nasal drip, unable to hold her head up Neck: No JVD, no thyromegaly, no carotid bruits Lungs: Severe kyphosis no use of accessory muscles, no dullness to percussion, decreased without rales or rhonchi  Cardiovascular: Rhythm regular, heart sounds  normal, no murmurs or gallops, no peripheral edema Musculoskeletal: No deformities, no cyanosis or clubbing , no tremors, bilateral AKA       Assessment & Plan:

## 2022-10-02 NOTE — Patient Instructions (Signed)
  X CXR today

## 2022-10-02 NOTE — Assessment & Plan Note (Signed)
Favored to be RA ILD with extensive honeycombing both bases.  She is not felt to be a candidate for antifibrotic's.  She has 4 functional status at baseline. Per rheumatology she refused immunosuppressants in the past.  She is maintained on 5 mg of prednisone she does not seem to have active synovitis on exam today.  Will continue prednisone at this level.  Will obtain chest x-ray due to her diagnosis of pneumonia in December for follow-up of bibasilar opacities, expect this to be unchanged. We will hold off on CT scan follow-up since we are not going to use antifibrotic's here.  Will follow her arthritis for dose of prednisone I briefly discussed advanced directives with her and seems like she has discussed this with her grandsons whom she lives with, she desires full medical care including life support if necessary.  Outpatient palliative care referral would be appropriate

## 2022-10-18 DEATH — deceased
